# Patient Record
Sex: Male | Born: 1935
Health system: Southern US, Community
[De-identification: ages and names within clinical notes are randomized; demographics above are authoritative.]

## PROBLEM LIST (undated history)

## (undated) ENCOUNTER — Emergency Department: Admission: EM | Discharge status: Absent without leave | Payer: Medicare Other

## (undated) DIAGNOSIS — R0600 Dyspnea, unspecified: Secondary | ICD-10-CM

## (undated) DIAGNOSIS — Z720 Tobacco use: Secondary | ICD-10-CM

## (undated) DIAGNOSIS — N183 Chronic kidney disease, stage 3 unspecified: Secondary | ICD-10-CM

## (undated) DIAGNOSIS — F319 Bipolar disorder, unspecified: Secondary | ICD-10-CM

## (undated) DIAGNOSIS — I10 Essential (primary) hypertension: Secondary | ICD-10-CM

## (undated) DIAGNOSIS — J45909 Unspecified asthma, uncomplicated: Secondary | ICD-10-CM

## (undated) DIAGNOSIS — C61 Malignant neoplasm of prostate: Secondary | ICD-10-CM

## (undated) DIAGNOSIS — I2510 Atherosclerotic heart disease of native coronary artery without angina pectoris: Secondary | ICD-10-CM

## (undated) DIAGNOSIS — I5042 Chronic combined systolic (congestive) and diastolic (congestive) heart failure: Secondary | ICD-10-CM

## (undated) DIAGNOSIS — F1020 Alcohol dependence, uncomplicated: Secondary | ICD-10-CM

## (undated) DIAGNOSIS — I255 Ischemic cardiomyopathy: Secondary | ICD-10-CM

## (undated) DIAGNOSIS — M1990 Unspecified osteoarthritis, unspecified site: Secondary | ICD-10-CM

## (undated) DIAGNOSIS — I739 Peripheral vascular disease, unspecified: Secondary | ICD-10-CM

## (undated) DIAGNOSIS — J449 Chronic obstructive pulmonary disease, unspecified: Secondary | ICD-10-CM

## (undated) DIAGNOSIS — E785 Hyperlipidemia, unspecified: Secondary | ICD-10-CM

## (undated) DIAGNOSIS — G4733 Obstructive sleep apnea (adult) (pediatric): Secondary | ICD-10-CM

## (undated) HISTORY — DX: Hyperlipidemia, unspecified: E78.5

## (undated) HISTORY — DX: Peripheral vascular disease, unspecified: I73.9

## (undated) HISTORY — DX: Obstructive sleep apnea (adult) (pediatric): G47.33

## (undated) HISTORY — DX: Bipolar disorder, unspecified: F31.9

## (undated) HISTORY — DX: Tobacco use: Z72.0

## (undated) HISTORY — DX: Essential (primary) hypertension: I10

## (undated) HISTORY — PX: CARDIAC CATHETERIZATION: SHX172

## (undated) HISTORY — DX: Atherosclerotic heart disease of native coronary artery without angina pectoris: I25.10

## (undated) HISTORY — DX: Unspecified osteoarthritis, unspecified site: M19.90

## (undated) HISTORY — DX: Alcohol dependence, uncomplicated: F10.20

## (undated) HISTORY — PX: TOTAL HIP ARTHROPLASTY: SHX124

## (undated) HISTORY — PX: CORONARY ANGIOPLASTY WITH STENT PLACEMENT: SHX49

---

## 1999-02-17 ENCOUNTER — Ambulatory Visit (HOSPITAL_COMMUNITY): Admission: RE | Admit: 1999-02-17 | Discharge: 1999-02-17 | Payer: Self-pay | Admitting: Gastroenterology

## 1999-02-17 ENCOUNTER — Encounter: Payer: Self-pay | Admitting: Gastroenterology

## 1999-03-31 ENCOUNTER — Encounter: Payer: Self-pay | Admitting: Internal Medicine

## 1999-03-31 ENCOUNTER — Ambulatory Visit (HOSPITAL_COMMUNITY): Admission: RE | Admit: 1999-03-31 | Discharge: 1999-03-31 | Payer: Self-pay | Admitting: Internal Medicine

## 1999-05-30 ENCOUNTER — Encounter: Payer: Self-pay | Admitting: Internal Medicine

## 1999-05-30 ENCOUNTER — Ambulatory Visit (HOSPITAL_COMMUNITY): Admission: RE | Admit: 1999-05-30 | Discharge: 1999-05-30 | Payer: Self-pay | Admitting: Internal Medicine

## 1999-11-28 ENCOUNTER — Inpatient Hospital Stay (HOSPITAL_COMMUNITY): Admission: EM | Admit: 1999-11-28 | Discharge: 1999-12-03 | Payer: Self-pay | Admitting: Specialist

## 2000-05-10 ENCOUNTER — Other Ambulatory Visit: Admission: RE | Admit: 2000-05-10 | Discharge: 2000-05-10 | Payer: Self-pay | Admitting: Urology

## 2000-05-18 ENCOUNTER — Encounter: Payer: Self-pay | Admitting: Urology

## 2000-05-18 ENCOUNTER — Encounter: Admission: RE | Admit: 2000-05-18 | Discharge: 2000-05-18 | Payer: Self-pay | Admitting: Urology

## 2000-06-04 ENCOUNTER — Encounter: Admission: RE | Admit: 2000-06-04 | Discharge: 2000-09-02 | Payer: Self-pay | Admitting: Radiation Oncology

## 2000-09-26 ENCOUNTER — Encounter: Payer: Self-pay | Admitting: Internal Medicine

## 2000-09-26 ENCOUNTER — Ambulatory Visit (HOSPITAL_COMMUNITY): Admission: RE | Admit: 2000-09-26 | Discharge: 2000-09-26 | Payer: Self-pay | Admitting: Internal Medicine

## 2000-10-17 ENCOUNTER — Emergency Department (HOSPITAL_COMMUNITY): Admission: EM | Admit: 2000-10-17 | Discharge: 2000-10-17 | Payer: Self-pay | Admitting: Emergency Medicine

## 2000-10-17 ENCOUNTER — Encounter: Payer: Self-pay | Admitting: Emergency Medicine

## 2001-03-06 ENCOUNTER — Encounter: Admission: RE | Admit: 2001-03-06 | Discharge: 2001-03-06 | Payer: Self-pay | Admitting: Orthopaedic Surgery

## 2004-01-19 DIAGNOSIS — I2510 Atherosclerotic heart disease of native coronary artery without angina pectoris: Secondary | ICD-10-CM | POA: Insufficient documentation

## 2004-01-19 HISTORY — DX: Atherosclerotic heart disease of native coronary artery without angina pectoris: I25.10

## 2004-02-04 ENCOUNTER — Emergency Department (HOSPITAL_COMMUNITY): Admission: EM | Admit: 2004-02-04 | Discharge: 2004-02-04 | Payer: Self-pay | Admitting: *Deleted

## 2004-02-09 ENCOUNTER — Inpatient Hospital Stay (HOSPITAL_COMMUNITY): Admission: EM | Admit: 2004-02-09 | Discharge: 2004-02-11 | Payer: Self-pay

## 2004-02-10 ENCOUNTER — Encounter (INDEPENDENT_AMBULATORY_CARE_PROVIDER_SITE_OTHER): Payer: Self-pay | Admitting: Cardiovascular Disease

## 2005-11-07 ENCOUNTER — Inpatient Hospital Stay (HOSPITAL_COMMUNITY): Admission: EM | Admit: 2005-11-07 | Discharge: 2005-11-11 | Payer: Self-pay | Admitting: Emergency Medicine

## 2005-11-07 ENCOUNTER — Ambulatory Visit: Payer: Self-pay | Admitting: Family Medicine

## 2005-11-10 ENCOUNTER — Ambulatory Visit: Payer: Self-pay | Admitting: Emergency Medicine

## 2006-04-19 ENCOUNTER — Observation Stay: Payer: Self-pay | Admitting: Internal Medicine

## 2006-05-27 ENCOUNTER — Emergency Department (HOSPITAL_COMMUNITY): Admission: EM | Admit: 2006-05-27 | Discharge: 2006-05-27 | Payer: Self-pay | Admitting: Emergency Medicine

## 2006-11-22 ENCOUNTER — Ambulatory Visit: Payer: Self-pay | Admitting: Psychiatry

## 2006-11-22 ENCOUNTER — Emergency Department (HOSPITAL_COMMUNITY): Admission: EM | Admit: 2006-11-22 | Discharge: 2006-11-22 | Payer: Self-pay | Admitting: Emergency Medicine

## 2006-11-22 ENCOUNTER — Inpatient Hospital Stay (HOSPITAL_COMMUNITY): Admission: RE | Admit: 2006-11-22 | Discharge: 2006-12-03 | Payer: Self-pay | Admitting: Psychiatry

## 2007-07-13 ENCOUNTER — Emergency Department: Payer: Self-pay | Admitting: Emergency Medicine

## 2007-07-17 ENCOUNTER — Emergency Department: Payer: Self-pay | Admitting: Emergency Medicine

## 2007-07-25 ENCOUNTER — Emergency Department (HOSPITAL_COMMUNITY): Admission: EM | Admit: 2007-07-25 | Discharge: 2007-07-25 | Payer: Self-pay | Admitting: Emergency Medicine

## 2007-11-19 ENCOUNTER — Emergency Department (HOSPITAL_COMMUNITY): Admission: EM | Admit: 2007-11-19 | Discharge: 2007-11-20 | Payer: Self-pay | Admitting: Emergency Medicine

## 2007-12-22 ENCOUNTER — Emergency Department (HOSPITAL_COMMUNITY): Admission: EM | Admit: 2007-12-22 | Discharge: 2007-12-22 | Payer: Self-pay | Admitting: Emergency Medicine

## 2008-07-21 ENCOUNTER — Ambulatory Visit: Payer: Self-pay | Admitting: Psychiatry

## 2008-07-21 ENCOUNTER — Emergency Department (HOSPITAL_COMMUNITY): Admission: EM | Admit: 2008-07-21 | Discharge: 2008-07-22 | Payer: Self-pay | Admitting: Emergency Medicine

## 2008-07-22 ENCOUNTER — Inpatient Hospital Stay (HOSPITAL_COMMUNITY): Admission: RE | Admit: 2008-07-22 | Discharge: 2008-08-12 | Payer: Self-pay | Admitting: Psychiatry

## 2008-09-02 ENCOUNTER — Emergency Department: Payer: Self-pay | Admitting: Internal Medicine

## 2008-09-28 ENCOUNTER — Other Ambulatory Visit: Payer: Self-pay | Admitting: Emergency Medicine

## 2008-09-28 ENCOUNTER — Inpatient Hospital Stay (HOSPITAL_COMMUNITY): Admission: RE | Admit: 2008-09-28 | Discharge: 2008-10-13 | Payer: Self-pay | Admitting: *Deleted

## 2008-09-28 ENCOUNTER — Other Ambulatory Visit: Payer: Self-pay

## 2008-09-28 ENCOUNTER — Ambulatory Visit: Payer: Self-pay | Admitting: *Deleted

## 2008-10-18 ENCOUNTER — Ambulatory Visit: Payer: Self-pay | Admitting: Infectious Disease

## 2008-10-18 ENCOUNTER — Inpatient Hospital Stay (HOSPITAL_COMMUNITY): Admission: EM | Admit: 2008-10-18 | Discharge: 2008-10-24 | Payer: Self-pay | Admitting: Emergency Medicine

## 2008-12-25 ENCOUNTER — Encounter (INDEPENDENT_AMBULATORY_CARE_PROVIDER_SITE_OTHER): Payer: Self-pay | Admitting: Internal Medicine

## 2009-01-17 ENCOUNTER — Inpatient Hospital Stay (HOSPITAL_COMMUNITY): Admission: AD | Admit: 2009-01-17 | Discharge: 2009-01-29 | Payer: Self-pay | Admitting: Psychiatry

## 2009-01-17 ENCOUNTER — Ambulatory Visit: Payer: Self-pay | Admitting: *Deleted

## 2009-02-16 ENCOUNTER — Inpatient Hospital Stay (HOSPITAL_COMMUNITY): Admission: AD | Admit: 2009-02-16 | Discharge: 2009-03-10 | Payer: Self-pay | Admitting: *Deleted

## 2009-02-16 ENCOUNTER — Ambulatory Visit: Payer: Self-pay | Admitting: *Deleted

## 2009-06-12 ENCOUNTER — Inpatient Hospital Stay (HOSPITAL_COMMUNITY): Admission: EM | Admit: 2009-06-12 | Discharge: 2009-06-16 | Payer: Self-pay | Admitting: Emergency Medicine

## 2009-06-12 ENCOUNTER — Ambulatory Visit: Payer: Self-pay | Admitting: Surgery

## 2009-06-12 ENCOUNTER — Emergency Department (HOSPITAL_COMMUNITY): Admission: EM | Admit: 2009-06-12 | Discharge: 2009-06-12 | Payer: Self-pay | Admitting: Emergency Medicine

## 2009-06-13 ENCOUNTER — Encounter: Payer: Self-pay | Admitting: Internal Medicine

## 2009-06-14 ENCOUNTER — Encounter (INDEPENDENT_AMBULATORY_CARE_PROVIDER_SITE_OTHER): Payer: Self-pay | Admitting: *Deleted

## 2009-06-16 ENCOUNTER — Ambulatory Visit: Payer: Self-pay | Admitting: Psychiatry

## 2009-06-16 ENCOUNTER — Inpatient Hospital Stay (HOSPITAL_COMMUNITY): Admission: RE | Admit: 2009-06-16 | Discharge: 2009-06-28 | Payer: Self-pay | Admitting: Psychiatry

## 2009-06-19 ENCOUNTER — Encounter: Payer: Self-pay | Admitting: Emergency Medicine

## 2009-06-29 ENCOUNTER — Emergency Department (HOSPITAL_COMMUNITY): Admission: EM | Admit: 2009-06-29 | Discharge: 2009-06-29 | Payer: Self-pay | Admitting: Emergency Medicine

## 2009-08-14 ENCOUNTER — Emergency Department (HOSPITAL_COMMUNITY): Admission: EM | Admit: 2009-08-14 | Discharge: 2009-08-14 | Payer: Self-pay | Admitting: Emergency Medicine

## 2009-12-10 ENCOUNTER — Other Ambulatory Visit: Payer: Self-pay | Admitting: Emergency Medicine

## 2009-12-10 ENCOUNTER — Inpatient Hospital Stay (HOSPITAL_COMMUNITY): Admission: RE | Admit: 2009-12-10 | Discharge: 2009-12-21 | Payer: Self-pay | Admitting: Psychiatry

## 2009-12-10 ENCOUNTER — Ambulatory Visit: Payer: Self-pay | Admitting: Psychiatry

## 2010-09-09 ENCOUNTER — Ambulatory Visit (HOSPITAL_COMMUNITY): Payer: Self-pay | Admitting: Psychiatry

## 2010-10-29 ENCOUNTER — Inpatient Hospital Stay (HOSPITAL_COMMUNITY): Admission: EM | Admit: 2010-10-29 | Discharge: 2010-11-01 | Payer: Self-pay | Admitting: Emergency Medicine

## 2010-10-31 ENCOUNTER — Ambulatory Visit: Payer: Self-pay | Admitting: Internal Medicine

## 2010-11-02 ENCOUNTER — Encounter: Payer: Self-pay | Admitting: Internal Medicine

## 2010-12-29 ENCOUNTER — Emergency Department (HOSPITAL_COMMUNITY)
Admission: EM | Admit: 2010-12-29 | Discharge: 2010-12-29 | Payer: Self-pay | Source: Home / Self Care | Admitting: Emergency Medicine

## 2011-01-02 LAB — COMPREHENSIVE METABOLIC PANEL
ALT: 41 U/L (ref 0–53)
AST: 51 U/L — ABNORMAL HIGH (ref 0–37)
Albumin: 3.8 g/dL (ref 3.5–5.2)
Alkaline Phosphatase: 124 U/L — ABNORMAL HIGH (ref 39–117)
BUN: 4 mg/dL — ABNORMAL LOW (ref 6–23)
CO2: 22 mEq/L (ref 19–32)
Calcium: 8.9 mg/dL (ref 8.4–10.5)
Chloride: 105 mEq/L (ref 96–112)
Creatinine, Ser: 0.69 mg/dL (ref 0.4–1.5)
GFR calc Af Amer: >60 mL/min (ref >60)
GFR calc non Af Amer: >60 mL/min (ref >60)
Glucose, Bld: 88 mg/dL (ref 70–99)
Potassium: 3.7 mEq/L (ref 3.5–5.1)
Sodium: 139 mEq/L (ref 135–145)
Total Bilirubin: 0.7 mg/dL (ref 0.3–1.2)
Total Protein: 7 g/dL (ref 6.0–8.3)

## 2011-01-02 LAB — CBC
HCT: 39.1 % (ref 39.0–52.0)
Hemoglobin: 13.7 g/dL (ref 13.0–17.0)
MCH: 30.1 pg (ref 26.0–34.0)
MCHC: 35 g/dL (ref 30.0–36.0)
MCV: 85.9 fL (ref 78.0–100.0)
Platelets: 245 10*3/uL (ref 150–400)
RBC: 4.55 MIL/uL (ref 4.22–5.81)
RDW: 16.3 % — ABNORMAL HIGH (ref 11.5–15.5)
WBC: 4.6 10*3/uL (ref 4.0–10.5)

## 2011-01-02 LAB — URINALYSIS, ROUTINE W REFLEX MICROSCOPIC
Ketones, ur: 15 mg/dL — AB
Nitrite: NEGATIVE
Protein, ur: 100 mg/dL — AB
Specific Gravity, Urine: 1.027 (ref 1.005–1.030)
Urine Glucose, Fasting: NEGATIVE mg/dL
Urobilinogen, UA: 1 mg/dL (ref 0.0–1.0)
pH: 6.5 (ref 5.0–8.0)

## 2011-01-02 LAB — DIFFERENTIAL
Basophils Absolute: 0.1 10*3/uL (ref 0.0–0.1)
Basophils Relative: 1 % (ref 0–1)
Eosinophils Absolute: 0 10*3/uL (ref 0.0–0.7)
Eosinophils Relative: 1 % (ref 0–5)
Lymphocytes Relative: 34 % (ref 12–46)
Lymphs Abs: 1.6 10*3/uL (ref 0.7–4.0)
Monocytes Absolute: 0.4 10*3/uL (ref 0.1–1.0)
Monocytes Relative: 9 % (ref 3–12)
Neutro Abs: 2.6 10*3/uL (ref 1.7–7.7)
Neutrophils Relative %: 56 % (ref 43–77)

## 2011-01-02 LAB — URINE MICROSCOPIC-ADD ON

## 2011-01-02 LAB — LIPASE, BLOOD: Lipase: 22 U/L (ref 11–59)

## 2011-01-04 ENCOUNTER — Emergency Department (HOSPITAL_COMMUNITY)
Admission: EM | Admit: 2011-01-04 | Discharge: 2011-01-04 | Payer: Self-pay | Source: Home / Self Care | Admitting: Emergency Medicine

## 2011-01-08 ENCOUNTER — Encounter: Payer: Self-pay | Admitting: Emergency Medicine

## 2011-01-09 LAB — POCT I-STAT, CHEM 8
BUN: 5 mg/dL — ABNORMAL LOW (ref 6–23)
Calcium, Ion: 1.08 mmol/L — ABNORMAL LOW (ref 1.12–1.32)
Chloride: 102 mEq/L (ref 96–112)
Glucose, Bld: 108 mg/dL — ABNORMAL HIGH (ref 70–99)

## 2011-01-09 LAB — URINE MICROSCOPIC-ADD ON

## 2011-01-09 LAB — URINALYSIS, ROUTINE W REFLEX MICROSCOPIC
Ketones, ur: NEGATIVE mg/dL
Urine Glucose, Fasting: NEGATIVE mg/dL
pH: 7 (ref 5.0–8.0)

## 2011-01-17 NOTE — Letter (Signed)
Summary: Patient [name] Biopsy Results  [name]  [place], [place] [address]   Phone: [phone]  Fax: [phone]        November 02, 2010 MRN: [id number]    [facility] [address] [place], [place]  [address]    Dear [name],  I am pleased to inform you that the biopsies taken during your recent endoscopic examination did not show any evidence of cancer upon pathologic examination.The biopsies show inflammation and atypical cells which ought to be rechecked in 5 years.  Additional information/recommendations:  __No further action is needed at this time.  Please follow-up with      your primary care physician for your other healthcare needs.  __ Please call [phone] to schedule a return visit to review      your condition.  x__ Continue with the treatment plan as outlined on the day of your      exam.  _x_ You should have a repeat endoscopic examination for this problem              in _5 /years.   Please call us if you are having persistent problems or have questions about your condition that have not been fully answered at this time.  Sincerely,  Nicholas Dragon MD  This letter has been electronically signed by your physician.  Appended Document: Patient Notice-Endo Biopsy Results letter mailed to patient's home

## 2011-01-17 NOTE — Procedures (Signed)
Summary: Upper Endoscopy  Patient: [name] Note: All result statuses are Final unless otherwise noted.  Tests: (1) Upper Endoscopy (EGD)   EGD Upper Endoscopy       [facility]     [place], [place]  [address]           ENDOSCOPY PROCEDURE REPORT           PATIENT:  [name], [name]  MR#:  [id number]     BIRTHDATE:  1935-03-21, 75 yrs. old  GENDER:  male           ENDOSCOPIST:  Lowella Bandy. Olevia Perches, MD     Referred by:  Edythe Lynn, M.D.           PROCEDURE DATE:  10/31/2010     PROCEDURE:  EGD with biopsy, 432[address]     ASA CLASS:  Class II     INDICATIONS:  abdominal pain hx of alcoholism, stopped x 8 months     and started last 2 weeks,, CT scan negative           MEDICATIONS:   Versed 6 mg, Fentanyl 60 mcg     TOPICAL ANESTHETIC:  Cetacaine Spray           DESCRIPTION OF PROCEDURE:   After the risks benefits and     alternatives of the procedure were thoroughly explained, informed     consent was obtained.  The [id number] endoscope was     introduced through the mouth and advanced to the second portion of     the duodenum, without limitations.  The instrument was slowly     withdrawn as the mucosa was fully examined.     <<PROCEDUREIMAGES>>           Mild gastritis was found. mild antral gastritis ( erosions), no     bleeding With standard forceps, a biopsy was obtained and sent to     pathology (see image003).  Esophagitis was found in the distal     esophagus. mild erythema amd a streak of irregular mucosa about 2     cm long above z-line, r/o Barrett's With standard forceps, a     biopsy was obtained and sent to pathology (see image002 and     image001).  Otherwise the examination was normal (see image006 and     image005). small reducible hiatal hernia    Retroflexed views     revealed no abnormalities.    The scope was then withdrawn from     the patient and the procedure completed.           COMPLICATIONS:  None       ENDOSCOPIC IMPRESSION:     1) Mild gastritis     2) Esophagitis in the distal esophagus     3) Otherwise normal examination     nothing to account for abdominal pain, r/o low grade     pancreatitis     RECOMMENDATIONS:     1) Await biopsy results     PPI, bowl rest,recheck amylase/lipase, antialcohol counselling           REPEAT EXAM:  In 0 year(s) for.           ______________________________     Lowella Bandy. Olevia Perches, MD           CC:           n.  eSIGNED:   Lowella Bandy. Lennix Kneisel at 10/31/2010 11:14 AM           Barbaraann Rondo, [id number]  Note: An exclamation mark (!) indicates a result that was not dispersed into the flowsheet. Document Creation Date: 10/31/2010 11:14 AM _______________________________________________________________________  (1) Order result status: Final Collection or observation date-time: 10/31/2010 11:00 Requested date-time:  Receipt date-time:  Reported date-time:  Referring Physician:   Ordering Physician: Delfin Edis [phone]) Specimen Source:  Source: Tawanna Cooler Order Number: [id number] Lab site:

## 2011-02-28 LAB — COMPREHENSIVE METABOLIC PANEL
ALT: 100 U/L — ABNORMAL HIGH (ref 0–53)
ALT: 26 U/L (ref 0–53)
AST: 50 U/L — ABNORMAL HIGH (ref 0–37)
AST: 68 U/L — ABNORMAL HIGH (ref 0–37)
Albumin: 2.9 g/dL — ABNORMAL LOW (ref 3.5–5.2)
Albumin: 3.1 g/dL — ABNORMAL LOW (ref 3.5–5.2)
Alkaline Phosphatase: 111 U/L (ref 39–117)
Alkaline Phosphatase: 134 U/L — ABNORMAL HIGH (ref 39–117)
BUN: 3 mg/dL — ABNORMAL LOW (ref 6–23)
BUN: 8 mg/dL (ref 6–23)
CO2: 24 mEq/L (ref 19–32)
Calcium: 8.4 mg/dL (ref 8.4–10.5)
Chloride: 98 mEq/L (ref 96–112)
Creatinine, Ser: 0.8 mg/dL (ref 0.4–1.5)
Creatinine, Ser: 0.88 mg/dL (ref 0.4–1.5)
GFR calc Af Amer: >60 mL/min (ref >60)
GFR calc Af Amer: >60 mL/min (ref >60)
GFR calc non Af Amer: >60 mL/min (ref >60)
Glucose, Bld: 91 mg/dL (ref 70–99)
Glucose, Bld: 98 mg/dL (ref 70–99)
Potassium: 3.6 mEq/L (ref 3.5–5.1)
Potassium: 3.7 mEq/L (ref 3.5–5.1)
Sodium: 138 mEq/L (ref 135–145)
Sodium: 140 mEq/L (ref 135–145)
Total Bilirubin: 1.1 mg/dL (ref 0.3–1.2)
Total Protein: 5.7 g/dL — ABNORMAL LOW (ref 6.0–8.3)
Total Protein: 6.2 g/dL (ref 6.0–8.3)
Total Protein: 7.2 g/dL (ref 6.0–8.3)

## 2011-02-28 LAB — CBC
HCT: 36.6 % — ABNORMAL LOW (ref 39.0–52.0)
HCT: 39.4 % (ref 39.0–52.0)
Hemoglobin: 13.7 g/dL (ref 13.0–17.0)
MCH: 27.9 pg (ref 26.0–34.0)
MCH: 28.7 pg (ref 26.0–34.0)
MCHC: 33.1 g/dL (ref 30.0–36.0)
MCHC: 34.8 g/dL (ref 30.0–36.0)
MCV: 84 fL (ref 78.0–100.0)
Platelets: 157 10*3/uL (ref 150–400)
Platelets: 191 10*3/uL (ref 150–400)
RDW: 14.5 % (ref 11.5–15.5)
RDW: 14.7 % (ref 11.5–15.5)

## 2011-02-28 LAB — LIPID PANEL
LDL Cholesterol: 78 mg/dL (ref 0–99)
VLDL: 12 mg/dL (ref 0–40)

## 2011-02-28 LAB — DIFFERENTIAL
Basophils Absolute: 0 10*3/uL (ref 0.0–0.1)
Basophils Relative: 0 % (ref 0–1)
Basophils Relative: 0 % (ref 0–1)
Eosinophils Absolute: 0 10*3/uL (ref 0.0–0.7)
Eosinophils Absolute: 0.1 10*3/uL (ref 0.0–0.7)
Eosinophils Relative: 0 % (ref 0–5)
Monocytes Relative: 5 % (ref 3–12)
Monocytes Relative: 8 % (ref 3–12)
Neutrophils Relative %: 61 % (ref 43–77)
Neutrophils Relative %: 80 % — ABNORMAL HIGH (ref 43–77)

## 2011-02-28 LAB — HEPATITIS C ANTIBODY: HCV Ab: NEGATIVE

## 2011-02-28 LAB — LACTIC ACID, PLASMA
Lactic Acid, Venous: 1.3 mmol/L (ref 0.5–2.2)
Lactic Acid, Venous: 2.6 mmol/L — ABNORMAL HIGH (ref 0.5–2.2)

## 2011-02-28 LAB — TSH: TSH: 0.798 u[IU]/mL (ref 0.350–4.500)

## 2011-02-28 LAB — URINALYSIS, ROUTINE W REFLEX MICROSCOPIC
Bilirubin Urine: NEGATIVE
Glucose, UA: NEGATIVE mg/dL
Ketones, ur: >80 mg/dL — AB
Protein, ur: 100 mg/dL — AB

## 2011-02-28 LAB — BASIC METABOLIC PANEL
Calcium: 8.5 mg/dL (ref 8.4–10.5)
GFR calc Af Amer: >60 mL/min (ref >60)
GFR calc non Af Amer: >60 mL/min (ref >60)
Glucose, Bld: 74 mg/dL (ref 70–99)
Sodium: 138 mEq/L (ref 135–145)

## 2011-02-28 LAB — RAPID URINE DRUG SCREEN, HOSP PERFORMED
Barbiturates: NOT DETECTED
Cocaine: NOT DETECTED
Opiates: POSITIVE — AB

## 2011-02-28 LAB — AMYLASE: Amylase: 79 U/L (ref 0–105)

## 2011-02-28 LAB — MAGNESIUM: Magnesium: 1.7 mg/dL (ref 1.5–2.5)

## 2011-02-28 LAB — GLUCOSE, CAPILLARY: Glucose-Capillary: 99 mg/dL (ref 70–99)

## 2011-02-28 LAB — TROPONIN I
Troponin I: 0.01 ng/mL (ref 0.00–0.06)
Troponin I: 0.02 ng/mL (ref 0.00–0.06)

## 2011-02-28 LAB — HEPATITIS B SURFACE ANTIBODY,QUALITATIVE: Hep B S Ab: POSITIVE — AB

## 2011-02-28 LAB — URINE MICROSCOPIC-ADD ON

## 2011-02-28 LAB — APTT: aPTT: 26 seconds (ref 24–37)

## 2011-02-28 LAB — PROTIME-INR: INR: 0.98 (ref 0.00–1.49)

## 2011-02-28 LAB — LIPASE, BLOOD: Lipase: 22 U/L (ref 11–59)

## 2011-03-10 ENCOUNTER — Encounter (INDEPENDENT_AMBULATORY_CARE_PROVIDER_SITE_OTHER): Payer: Self-pay | Admitting: Internal Medicine

## 2011-03-16 NOTE — Letter (Signed)
Summary: [facility]//SUPPLIES & EQUIPMENT//FAXED  [facility]//SUPPLIES & EQUIPMENT//FAXED   Imported By: Roland Earl 03/10/2011 11:55:32  _____________________________________________________________________  External Attachment:    Type:   Image     Comment:   External Document

## 2011-03-20 LAB — URINE MICROSCOPIC-ADD ON

## 2011-03-20 LAB — URINALYSIS, ROUTINE W REFLEX MICROSCOPIC
Bilirubin Urine: NEGATIVE
Glucose, UA: NEGATIVE mg/dL
Hgb urine dipstick: NEGATIVE
Ketones, ur: NEGATIVE mg/dL
pH: 5 (ref 5.0–8.0)

## 2011-03-20 LAB — COMPREHENSIVE METABOLIC PANEL
ALT: 11 U/L (ref 0–53)
AST: 19 U/L (ref 0–37)
Alkaline Phosphatase: 131 U/L — ABNORMAL HIGH (ref 39–117)
CO2: 22 mEq/L (ref 19–32)
GFR calc Af Amer: >60 mL/min (ref >60)
GFR calc non Af Amer: >60 mL/min (ref >60)
Glucose, Bld: 123 mg/dL — ABNORMAL HIGH (ref 70–99)
Potassium: 4 mEq/L (ref 3.5–5.1)
Sodium: 140 mEq/L (ref 135–145)

## 2011-03-20 LAB — DIFFERENTIAL
Basophils Relative: 1 % (ref 0–1)
Eosinophils Absolute: 0.1 10*3/uL (ref 0.0–0.7)
Eosinophils Relative: 1 % (ref 0–5)
Lymphs Abs: 2.6 10*3/uL (ref 0.7–4.0)
Monocytes Relative: 7 % (ref 3–12)
Neutrophils Relative %: 46 % (ref 43–77)

## 2011-03-20 LAB — RAPID URINE DRUG SCREEN, HOSP PERFORMED
Barbiturates: NOT DETECTED
Cocaine: NOT DETECTED
Opiates: NOT DETECTED
Tetrahydrocannabinol: POSITIVE — AB

## 2011-03-20 LAB — CBC
Hemoglobin: 14.7 g/dL (ref 13.0–17.0)
RBC: 4.98 MIL/uL (ref 4.22–5.81)
WBC: 5.9 10*3/uL (ref 4.0–10.5)

## 2011-03-20 LAB — ETHANOL: Alcohol, Ethyl (B): 252 mg/dL — ABNORMAL HIGH (ref 0–10)

## 2011-03-20 LAB — GC/CHLAMYDIA PROBE AMP, URINE: Chlamydia, Swab/Urine, PCR: NEGATIVE

## 2011-03-25 LAB — RAPID URINE DRUG SCREEN, HOSP PERFORMED
Amphetamines: NOT DETECTED
Barbiturates: NOT DETECTED
Cocaine: NOT DETECTED
Opiates: NOT DETECTED

## 2011-03-25 LAB — COMPREHENSIVE METABOLIC PANEL
Albumin: 3.5 g/dL (ref 3.5–5.2)
Alkaline Phosphatase: 129 U/L — ABNORMAL HIGH (ref 39–117)
BUN: 3 mg/dL — ABNORMAL LOW (ref 6–23)
CO2: 25 mEq/L (ref 19–32)
Chloride: 103 mEq/L (ref 96–112)
Creatinine, Ser: 0.88 mg/dL (ref 0.4–1.5)
GFR calc non Af Amer: >60 mL/min (ref >60)
Glucose, Bld: 103 mg/dL — ABNORMAL HIGH (ref 70–99)
Potassium: 3.7 mEq/L (ref 3.5–5.1)
Total Bilirubin: 0.5 mg/dL (ref 0.3–1.2)

## 2011-03-25 LAB — POCT CARDIAC MARKERS
CKMB, poc: 1.3 ng/mL (ref 1.0–8.0)
Myoglobin, poc: 85.8 ng/mL (ref 12–200)
Troponin i, poc: <0.05 ng/mL (ref 0.00–0.09)

## 2011-03-25 LAB — CBC
HCT: 41.6 % (ref 39.0–52.0)
Hemoglobin: 14 g/dL (ref 13.0–17.0)
MCV: 87.8 fL (ref 78.0–100.0)
Platelets: 339 10*3/uL (ref 150–400)
WBC: 6.3 10*3/uL (ref 4.0–10.5)

## 2011-03-25 LAB — URINALYSIS, ROUTINE W REFLEX MICROSCOPIC
Bilirubin Urine: NEGATIVE
Hgb urine dipstick: NEGATIVE
Ketones, ur: NEGATIVE mg/dL
Nitrite: NEGATIVE
Specific Gravity, Urine: 1.022 (ref 1.005–1.030)
Urobilinogen, UA: 0.2 mg/dL (ref 0.0–1.0)

## 2011-03-25 LAB — DIFFERENTIAL
Basophils Absolute: 0 10*3/uL (ref 0.0–0.1)
Basophils Relative: 0 % (ref 0–1)
Lymphocytes Relative: 34 % (ref 12–46)
Monocytes Absolute: 0.4 10*3/uL (ref 0.1–1.0)
Neutro Abs: 3.7 10*3/uL (ref 1.7–7.7)
Neutrophils Relative %: 58 % (ref 43–77)

## 2011-03-25 LAB — ETHANOL: Alcohol, Ethyl (B): 102 mg/dL — ABNORMAL HIGH (ref 0–10)

## 2011-03-25 LAB — URINE MICROSCOPIC-ADD ON

## 2011-03-26 LAB — POCT CARDIAC MARKERS
CKMB, poc: 1.2 ng/mL (ref 1.0–8.0)
CKMB, poc: 1.5 ng/mL (ref 1.0–8.0)
Myoglobin, poc: 69.4 ng/mL (ref 12–200)
Troponin i, poc: <0.05 ng/mL (ref 0.00–0.09)
Troponin i, poc: <0.05 ng/mL (ref 0.00–0.09)

## 2011-03-26 LAB — BASIC METABOLIC PANEL
BUN: 12 mg/dL (ref 6–23)
CO2: 25 mEq/L (ref 19–32)
Calcium: 9 mg/dL (ref 8.4–10.5)
Glucose, Bld: 144 mg/dL — ABNORMAL HIGH (ref 70–99)
Potassium: 4.2 mEq/L (ref 3.5–5.1)
Sodium: 134 mEq/L — ABNORMAL LOW (ref 135–145)

## 2011-03-26 LAB — CBC
HCT: 39.4 % (ref 39.0–52.0)
Hemoglobin: 13.7 g/dL (ref 13.0–17.0)
MCHC: 34.7 g/dL (ref 30.0–36.0)
Platelets: 230 10*3/uL (ref 150–400)
RDW: 14.3 % (ref 11.5–15.5)

## 2011-03-26 LAB — DIFFERENTIAL
Basophils Absolute: 0 10*3/uL (ref 0.0–0.1)
Basophils Relative: 1 % (ref 0–1)
Eosinophils Absolute: 0.3 10*3/uL (ref 0.0–0.7)
Eosinophils Relative: 4 % (ref 0–5)
Monocytes Absolute: 0.7 10*3/uL (ref 0.1–1.0)

## 2011-03-26 LAB — POCT I-STAT, CHEM 8
Calcium, Ion: 1.03 mmol/L — ABNORMAL LOW (ref 1.12–1.32)
Chloride: 104 mEq/L (ref 96–112)
Glucose, Bld: 110 mg/dL — ABNORMAL HIGH (ref 70–99)
HCT: 42 % (ref 39.0–52.0)
Hemoglobin: 14.3 g/dL (ref 13.0–17.0)
TCO2: 24 mmol/L (ref 0–100)

## 2011-03-27 ENCOUNTER — Encounter (HOSPITAL_COMMUNITY): Payer: Self-pay | Admitting: Radiology

## 2011-03-27 ENCOUNTER — Emergency Department (HOSPITAL_COMMUNITY): Payer: Medicare Other

## 2011-03-27 ENCOUNTER — Emergency Department (HOSPITAL_COMMUNITY)
Admission: EM | Admit: 2011-03-27 | Discharge: 2011-03-27 | Disposition: A | Discharge status: Home / Self Care | Payer: Medicare Other | Attending: Emergency Medicine | Admitting: Emergency Medicine

## 2011-03-27 DIAGNOSIS — F17200 Nicotine dependence, unspecified, uncomplicated: Secondary | ICD-10-CM | POA: Insufficient documentation

## 2011-03-27 DIAGNOSIS — R109 Unspecified abdominal pain: Secondary | ICD-10-CM | POA: Insufficient documentation

## 2011-03-27 DIAGNOSIS — F1010 Alcohol abuse, uncomplicated: Secondary | ICD-10-CM | POA: Insufficient documentation

## 2011-03-27 DIAGNOSIS — Q619 Cystic kidney disease, unspecified: Secondary | ICD-10-CM | POA: Insufficient documentation

## 2011-03-27 DIAGNOSIS — R112 Nausea with vomiting, unspecified: Secondary | ICD-10-CM | POA: Insufficient documentation

## 2011-03-27 DIAGNOSIS — I10 Essential (primary) hypertension: Secondary | ICD-10-CM | POA: Insufficient documentation

## 2011-03-27 DIAGNOSIS — I2510 Atherosclerotic heart disease of native coronary artery without angina pectoris: Secondary | ICD-10-CM | POA: Insufficient documentation

## 2011-03-27 HISTORY — DX: Malignant neoplasm of prostate: C61

## 2011-03-27 HISTORY — DX: Essential (primary) hypertension: I10

## 2011-03-27 HISTORY — DX: Chronic obstructive pulmonary disease, unspecified: J44.9

## 2011-03-27 LAB — COMPREHENSIVE METABOLIC PANEL
ALT: 14 U/L (ref 0–53)
Albumin: 3.2 g/dL — ABNORMAL LOW (ref 3.5–5.2)
Alkaline Phosphatase: 108 U/L (ref 39–117)
BUN: 9 mg/dL (ref 6–23)
CO2: 23 mEq/L (ref 19–32)
Calcium: 8.7 mg/dL (ref 8.4–10.5)
Calcium: 8.4 mg/dL (ref 8.4–10.5)
Chloride: 102 mEq/L (ref 96–112)
Creatinine, Ser: 1.03 mg/dL (ref 0.4–1.5)
GFR calc non Af Amer: >60 mL/min (ref >60)
Glucose, Bld: 83 mg/dL (ref 70–99)
Potassium: 4.1 mEq/L (ref 3.5–5.1)
Sodium: 140 mEq/L (ref 135–145)
Total Bilirubin: 0.6 mg/dL (ref 0.3–1.2)
Total Bilirubin: 0.7 mg/dL (ref 0.3–1.2)

## 2011-03-27 LAB — DIFFERENTIAL
Basophils Absolute: 0 10*3/uL (ref 0.0–0.1)
Basophils Absolute: 0 10*3/uL (ref 0.0–0.1)
Basophils Relative: 1 % (ref 0–1)
Eosinophils Relative: 1 % (ref 0–5)
Lymphocytes Relative: 28 % (ref 12–46)
Lymphocytes Relative: 20 % (ref 12–46)
Lymphocytes Relative: 24 % (ref 12–46)
Lymphs Abs: 2.2 10*3/uL (ref 0.7–4.0)
Lymphs Abs: 2 10*3/uL (ref 0.7–4.0)
Monocytes Absolute: 0.7 10*3/uL (ref 0.1–1.0)
Monocytes Relative: 5 % (ref 3–12)
Neutro Abs: 4.7 10*3/uL (ref 1.7–7.7)
Neutro Abs: 5.4 10*3/uL (ref 1.7–7.7)
Neutrophils Relative %: 75 % (ref 43–77)

## 2011-03-27 LAB — POCT I-STAT, CHEM 8
BUN: 7 mg/dL (ref 6–23)
Chloride: 106 mEq/L (ref 96–112)
HCT: 49 % (ref 39.0–52.0)
Potassium: 4 mEq/L (ref 3.5–5.1)

## 2011-03-27 LAB — CBC
HCT: 42 % (ref 39.0–52.0)
HCT: 38.3 % — ABNORMAL LOW (ref 39.0–52.0)
Hemoglobin: 15.9 g/dL (ref 13.0–17.0)
MCHC: 33.5 g/dL (ref 30.0–36.0)
MCHC: 34.2 g/dL (ref 30.0–36.0)
MCV: 87.7 fL (ref 78.0–100.0)
MCV: 88.4 fL (ref 78.0–100.0)
Platelets: 270 10*3/uL (ref 150–400)
RBC: 5.36 MIL/uL (ref 4.22–5.81)
RDW: 14.1 % (ref 11.5–15.5)
RDW: 14.9 % (ref 11.5–15.5)
RDW: 15.2 % (ref 11.5–15.5)
WBC: 7.7 10*3/uL (ref 4.0–10.5)
WBC: 8.2 10*3/uL (ref 4.0–10.5)

## 2011-03-27 LAB — URINE MICROSCOPIC-ADD ON

## 2011-03-27 LAB — URINALYSIS, ROUTINE W REFLEX MICROSCOPIC
Bilirubin Urine: NEGATIVE
Glucose, UA: NEGATIVE mg/dL
Leukocytes, UA: NEGATIVE
Leukocytes, UA: NEGATIVE
Nitrite: NEGATIVE
Specific Gravity, Urine: 1.012 (ref 1.005–1.030)
Urobilinogen, UA: 0.2 mg/dL (ref 0.0–1.0)
pH: 5.5 (ref 5.0–8.0)
pH: 6 (ref 5.0–8.0)

## 2011-03-27 LAB — LIPID PANEL
HDL: 54 mg/dL (ref >39)
Total CHOL/HDL Ratio: 2.2 RATIO
Triglycerides: 79 mg/dL (ref <150)
VLDL: 16 mg/dL (ref 0–40)

## 2011-03-27 LAB — POCT CARDIAC MARKERS
CKMB, poc: 2.3 ng/mL (ref 1.0–8.0)
Troponin i, poc: <0.05 ng/mL (ref 0.00–0.09)

## 2011-03-27 LAB — ETHANOL: Alcohol, Ethyl (B): 115 mg/dL — ABNORMAL HIGH (ref 0–10)

## 2011-03-27 LAB — FREE PSA

## 2011-03-27 LAB — HEMOGLOBIN A1C: Hgb A1c MFr Bld: 5.8 % (ref 4.6–6.1)

## 2011-03-27 LAB — CARDIAC PANEL(CRET KIN+CKTOT+MB+TROPI)
Total CK: 284 U/L — ABNORMAL HIGH (ref 7–232)
Troponin I: 0.01 ng/mL (ref 0.00–0.06)
Troponin I: 0.01 ng/mL (ref 0.00–0.06)
Troponin I: 0.01 ng/mL (ref 0.00–0.06)

## 2011-03-27 LAB — RAPID URINE DRUG SCREEN, HOSP PERFORMED
Amphetamines: NOT DETECTED
Opiates: NOT DETECTED
Tetrahydrocannabinol: POSITIVE — AB

## 2011-03-27 LAB — CK TOTAL AND CKMB (NOT AT ARMC)
CK, MB: 1.7 ng/mL (ref 0.3–4.0)
Relative Index: 1.1 (ref 0.0–2.5)

## 2011-03-27 LAB — TROPONIN I: Troponin I: 0.02 ng/mL (ref 0.00–0.06)

## 2011-03-27 LAB — PSA: PSA: 0.03 ng/mL — ABNORMAL LOW (ref 0.10–4.00)

## 2011-03-27 LAB — LIPASE, BLOOD: Lipase: 39 U/L (ref 11–59)

## 2011-03-27 MED ORDER — IOHEXOL 300 MG/ML  SOLN
125.0000 mL | Freq: Once | INTRAMUSCULAR | Status: AC | PRN
  Administered 2011-03-27: 125 mL via INTRAVENOUS

## 2011-03-30 LAB — COMPREHENSIVE METABOLIC PANEL
ALT: 20 U/L (ref 0–53)
AST: 29 U/L (ref 0–37)
Calcium: 9.1 mg/dL (ref 8.4–10.5)
Creatinine, Ser: 0.82 mg/dL (ref 0.4–1.5)
GFR calc Af Amer: >60 mL/min (ref >60)
Sodium: 137 mEq/L (ref 135–145)
Total Protein: 6.5 g/dL (ref 6.0–8.3)

## 2011-04-03 LAB — CBC
HCT: 36.2 % — ABNORMAL LOW (ref 39.0–52.0)
Hemoglobin: 12 g/dL — ABNORMAL LOW (ref 13.0–17.0)
MCV: 89 fL (ref 78.0–100.0)
Platelets: 245 10*3/uL (ref 150–400)
RDW: 15.2 % (ref 11.5–15.5)
WBC: 6.4 10*3/uL (ref 4.0–10.5)

## 2011-04-03 LAB — COMPREHENSIVE METABOLIC PANEL
Albumin: 2.9 g/dL — ABNORMAL LOW (ref 3.5–5.2)
Alkaline Phosphatase: 106 U/L (ref 39–117)
BUN: 8 mg/dL (ref 6–23)
Chloride: 101 mEq/L (ref 96–112)
Creatinine, Ser: 0.81 mg/dL (ref 0.4–1.5)
Glucose, Bld: 109 mg/dL — ABNORMAL HIGH (ref 70–99)
Potassium: 3.1 mEq/L — ABNORMAL LOW (ref 3.5–5.1)
Total Bilirubin: 0.6 mg/dL (ref 0.3–1.2)

## 2011-04-04 LAB — POTASSIUM: Potassium: 4.3 mEq/L (ref 3.5–5.1)

## 2011-04-18 ENCOUNTER — Ambulatory Visit: Payer: Medicare Other | Admitting: Physical Therapy

## 2011-04-21 ENCOUNTER — Emergency Department (HOSPITAL_COMMUNITY): Payer: Medicare Other

## 2011-04-21 ENCOUNTER — Emergency Department (HOSPITAL_COMMUNITY)
Admission: EM | Admit: 2011-04-21 | Discharge: 2011-04-21 | Disposition: A | Discharge status: Home / Self Care | Payer: Medicare Other | Attending: Emergency Medicine | Admitting: Emergency Medicine

## 2011-04-21 DIAGNOSIS — I10 Essential (primary) hypertension: Secondary | ICD-10-CM | POA: Insufficient documentation

## 2011-04-21 DIAGNOSIS — R05 Cough: Secondary | ICD-10-CM | POA: Insufficient documentation

## 2011-04-21 DIAGNOSIS — R110 Nausea: Secondary | ICD-10-CM | POA: Insufficient documentation

## 2011-04-21 DIAGNOSIS — R059 Cough, unspecified: Secondary | ICD-10-CM | POA: Insufficient documentation

## 2011-04-21 DIAGNOSIS — I2510 Atherosclerotic heart disease of native coronary artery without angina pectoris: Secondary | ICD-10-CM | POA: Insufficient documentation

## 2011-04-21 DIAGNOSIS — J449 Chronic obstructive pulmonary disease, unspecified: Secondary | ICD-10-CM | POA: Insufficient documentation

## 2011-04-21 DIAGNOSIS — F319 Bipolar disorder, unspecified: Secondary | ICD-10-CM | POA: Insufficient documentation

## 2011-04-21 DIAGNOSIS — R0789 Other chest pain: Secondary | ICD-10-CM | POA: Insufficient documentation

## 2011-04-21 DIAGNOSIS — J40 Bronchitis, not specified as acute or chronic: Secondary | ICD-10-CM | POA: Insufficient documentation

## 2011-04-21 DIAGNOSIS — J4489 Other specified chronic obstructive pulmonary disease: Secondary | ICD-10-CM | POA: Insufficient documentation

## 2011-04-21 DIAGNOSIS — R51 Headache: Secondary | ICD-10-CM | POA: Insufficient documentation

## 2011-04-21 DIAGNOSIS — R5381 Other malaise: Secondary | ICD-10-CM | POA: Insufficient documentation

## 2011-04-21 DIAGNOSIS — R0602 Shortness of breath: Secondary | ICD-10-CM | POA: Insufficient documentation

## 2011-04-21 DIAGNOSIS — J069 Acute upper respiratory infection, unspecified: Secondary | ICD-10-CM | POA: Insufficient documentation

## 2011-04-21 LAB — DIFFERENTIAL
Basophils Absolute: 0 10*3/uL (ref 0.0–0.1)
Basophils Relative: 0 % (ref 0–1)
Eosinophils Absolute: 0.2 10*3/uL (ref 0.0–0.7)
Monocytes Relative: 8 % (ref 3–12)
Neutro Abs: 3.9 10*3/uL (ref 1.7–7.7)
Neutrophils Relative %: 60 % (ref 43–77)

## 2011-04-21 LAB — BASIC METABOLIC PANEL
BUN: 9 mg/dL (ref 6–23)
Calcium: 8.9 mg/dL (ref 8.4–10.5)
Creatinine, Ser: 0.7 mg/dL (ref 0.4–1.5)
GFR calc non Af Amer: >60 mL/min (ref >60)
Glucose, Bld: 99 mg/dL (ref 70–99)
Potassium: 4 mEq/L (ref 3.5–5.1)

## 2011-04-21 LAB — CBC
Hemoglobin: 13.5 g/dL (ref 13.0–17.0)
Platelets: 276 10*3/uL (ref 150–400)
RBC: 4.52 MIL/uL (ref 4.22–5.81)
WBC: 6.5 10*3/uL (ref 4.0–10.5)

## 2011-04-21 LAB — PRO B NATRIURETIC PEPTIDE: Pro B Natriuretic peptide (BNP): 486.8 pg/mL — ABNORMAL HIGH (ref 0–125)

## 2011-04-21 LAB — CK TOTAL AND CKMB (NOT AT ARMC)
CK, MB: 3.3 ng/mL (ref 0.3–4.0)
Relative Index: 1.2 (ref 0.0–2.5)
Total CK: 274 U/L — ABNORMAL HIGH (ref 7–232)

## 2011-04-21 LAB — TROPONIN I: Troponin I: <0.3 ng/mL (ref <0.30)

## 2011-04-24 ENCOUNTER — Ambulatory Visit: Payer: Medicare Other | Attending: Internal Medicine | Admitting: Physical Therapy

## 2011-05-02 NOTE — Consult Note (Signed)
NAMEJALEEN, [name] NO.:  [id number]   MEDICAL RECORD NO.:  [id number]          PATIENT TYPE:  INP   LOCATION:  [id number]                         FACILITY:  [facility]   PHYSICIAN:  Felizardo Hoffmann, M.D.  DATE OF BIRTH:  17-Feb-1935   DATE OF CONSULTATION:  10/22/2008  DATE OF DISCHARGE:                                 CONSULTATION   HISTORY:  [name] received news that he will not be eligible for his  program in [place] that was going to involve long-term chemical  dependence rehabilitation therapy.  He has returned to hopelessness and  dysphoria.  He has decreased energy.  He has suicidal thoughts.  He  knows that he would be at risk to relapse.   He is agreeing to call the nursing station if he cannot resist any  suicidal impulses.  He is cooperative with care.  He is oriented to all  spheres and has intact memory function.  He is able to ambulate.  He is  able to participate in a psychiatric inpatient milieu.   REVIEW OF SYSTEMS:  No stiffness or other extrapyramidal symptoms with  his Seroquel.   PHYSICAL EXAMINATION:  VITAL SIGNS:  Temperature 97.9, pulse 78,  respiratory rate 20, blood pressure 156/80, O2 saturation on room air  99%.   MENTAL STATUS EXAM:  [name] has downcast facies.  He has constricted  affect, depressed mood.  His eye contact is good.  He is oriented to all  spheres.  His memory is intact to immediate recent and remote.  His  speech is soft.  There is no dysarthria.  Thought process is logical,  coherent and goal directed.  No looseness of associations.  Thought  content as above.  No current hallucinations or delusions.  Insight is  intact for the need of psychiatric treatment.  Judgment is intact for  functioning in a hospital milieu, however, it is not intact for  functioning outside the hospital.  He would be at risk for lethal self-  neglect as well as suicide.   ASSESSMENT:  293.83, mood disorder not otherwise specified  (idiopathic  and substance factors), depressed.  Alcohol dependence.  293.84, anxiety disorder not otherwise specified.   [name] is at risk for suicide outside of the supportive environment of  the hospital.  He is also at high risk for relapse of alcohol.   Therefore, the undersigned recommends admission as soon as possible to  an inpatient psychiatric unit for dual diagnosis treatment.  Would not  change his current psychotropic medication.   [name] does contract to notify the nursing station immediately if he  has any suicidal impulses that he cannot resist.  He is highly motivated  for continuing inpatient care.     Felizardo Hoffmann, M.D.  Electronically Signed    JW/MEDQ  D:  10/22/2008  T:  10/22/2008  Job:  [id number]

## 2011-05-02 NOTE — H&P (Signed)
[name], SNAY NO.:  [id number]   MEDICAL RECORD NO.:  [id number]          PATIENT TYPE:  IPS   LOCATION:  [id number]                          [facility]   PHYSICIAN:  Stark Jock, M.D. DATE OF BIRTH:  08-13-1935   DATE OF ADMISSION:  02/16/2009  DATE OF DISCHARGE:                       PSYCHIATRIC ADMISSION ASSESSMENT   IDENTIFICATION:  This is a 77 year old divorced African American male  from [place].   HISTORY OF PRESENT ILLNESS:  The patient states that he went to his  assisted living in [place] from his last admission here.  He  states he did not like it.  He was not getting his medications he says.  He began to feel suicidal, I feel like I want to end it.  He got out  of the assisted living and came back to [place].  He checked into a  motel room and got drunk according to the patient.  He has not seen his  family since out of the hospital and feels that they do not care about  his whereabouts.   PAST PSYCHIATRIC HISTORY:  The patient was last with [place] on January 17, 2009, to January 29, 2009.  He has been with [place] many times for suicidal  thoughts and alcohol use.  He was scheduled for follow up at [facility] in  [place], but did not comply.   FAMILY HISTORY:  Father had depression.  Mother had alcohol problems.   ALCOHOL AND DRUG HISTORY:  Family denies any current alcohol or drug  use.  He does have a history of substance abuse however.   MEDICAL PROBLEMS:  Hypertension, coronary artery disease.   MEDICATIONS:  The patient was on Colace 100 mg daily, Zyprexa 5 mg at  bedtime, Zoloft 125 mg daily, enteric-coated aspirin 81 mg daily, Coreg  9.375 mg p.o. b.i.d., thiamine 123XX123 mg daily, Mylicon chewable tablets  with meals and bedtime, Tylenol for pain, MiraLax for constipation.   DRUG ALLERGIES:  No known drug allergies.   PHYSICAL FINDINGS:  There were no acute physical or medical problems  noted.   ADMISSION MENTAL  STATUS EXAMINATION:  The patient presented as a  disheveled, older [place] American male with fair eye contact.  There  was psychomotor retardation and speech was soft and slow.  He was alert.  His mood was depressed and anxious.  Affect was consistent with mood.  He stated his anxiety level was severe.  Thought processes were  coherent.  Thought content, no predominant theme.  No auditory or visual  hallucinations.  No paranoia or delusions.  Judgment was poor.  Insight  absent.   ADMISSION DIAGNOSES:  Axis I:  Mood disorder, not otherwise specified.  History of alcohol dependence.  Axis II:  Features of borderline personality disorder.  Axis III:  Hypertension, coronary artery disease, and anemia.  History  of prostate cancer and degenerative disk disease.  Axis IV:  Severe (recent problems with housing, medical problems,  psychosocial problems, medication noncompliance, and lack of follow up  and burden of psychiatric illness).  Axis V:  Global assessment of functioning  was 35 upon admission, GAF  highest past year was 1.   HOSPITAL PLAN:  The patient's medications will be restarted.  He will  attend the unit therapeutic groups and activities.  I will gather  further history from him and any other care providers and the patient  will be on the Red Team for chemical dependence and the anticipated  length of stay is 3-5 days.   CONDITION:  He is ready for discharge, less depressed, less anxious, not  suicidal.   POSTHOSPITAL CARE PLAN:  The patient will return to outpatient therapy  and medication management at [facility].      Stark Jock, M.D.  Electronically Signed     BHS/MEDQ  D:  02/20/2009  T:  02/21/2009  Job:  [id number]

## 2011-05-02 NOTE — Consult Note (Signed)
NAMEJAMARR, PERSAD NO.:  [id number]   MEDICAL RECORD NO.:  [id number]          PATIENT TYPE:  INP   LOCATION:  [id number]                         FACILITY:  [facility]   PHYSICIAN:  Felizardo Hoffmann, M.D.  DATE OF BIRTH:  November 03, 1935   DATE OF CONSULTATION:  10/19/2008  DATE OF DISCHARGE:                                 CONSULTATION   REFERRING PHYSICIAN:  Rico Sheehan, D.O.   REASON FOR CONSULTATION:  Depression, suicide attempt.   HISTORY OF PRESENT ILLNESS:  [name] is a 76 year old male  admitted to the [facility] on October 18, 2008 due to an overdose of  Seroquel and trazodone.   [name] was recently in psychiatric hospital.  He was found  unconscious.  After he became alert, he acknowledged that he made a  suicide attempt.  He describes no new psychosocial stresses or medical  problems.  He has been homeless however, and he has experienced several  weeks of depressed mood, insomnia, decreased energy, poor concentration  and suicidal thoughts.  He has not improved in his mood with social and  psychological support.  Also, he continued to be depressed despite a  recent psychiatric hospitalization with a prescription of trazodone and  Seroquel.   PAST PSYCHIATRIC HISTORY:  In review of the record that is available,  which is an emergency room record there is no report of any previous  psychotropic trials.  The patient does not recall the one medication  that was prescribed which was associated with an improvement in his  depression.   He states that his first depressed episode was in the 1960s.  He does  not know what medication he was treated with at that time.  He had  experienced several episodes of major depression.  He also has attempted  suicide at least 6 times.  He has never experienced any decreased need  for sleep or increased energy.   He has experienced very negative hallucinations in an auditory form  during depression.  He has  experienced at least 6 psychiatric  admissions.   When several psychotropic medications were reviewed with the patient  seemed to recall Prozac and thought it was not effective.  However, he  could not recall the names of any other medications.   He states that a successful treatment occurred when he was seeing  Chucky May, M.D., but he does not recall that medication.   [name] does have many worries.  He has feeling on edge and muscle  tension.   FAMILY PSYCHIATRIC HISTORY:  None known.   SOCIAL HISTORY:  [name] does have five children.  He is homeless.  He  is currently single.  He does have a history of heavy alcohol use.  He  currently states that he does not drink everyday, but when he does drink  he drinks approximately three 40-ounce malt liquor cans.  He does not  use illegal drugs.  He is retired from [employer].  He does have  five children.   PAST MEDICAL HISTORY:  1. Coronary artery disease.  2. Hypertension.   MEDICATIONS:  The MAR is  reviewed.  [name] is on:  1. Nicoderm 21 mg patch daily.  2. Seroquel 50 mg nightly.  3. Ativan 1 mg q.4h. p.r.n.  4. Thiamin 100 mg daily.   He has no known drug allergies.   LABORATORY DATA:  Head CT without contrast shows no acute abnormality.   LABORATORY DATA:  Alcohol was 14 by the time he arrived to the emergency  room.  WBC 10.3, hemoglobin 11.8, platelet count 271.  Sodium 138, BUN  13, creatinine 1, glucose 92.  SGOT 27, SGPT 29.  Urine drug screen  positive for benzodiazepines.  Tylenol, aspirin and lithium  unremarkable.  The EKG on November 2, showed a QGC of 435 milliseconds.   REVIEW OF SYSTEMS:  CONSTITUTIONAL, HEAD, EYES, EARS, NOSE, THROAT,  MOUTH, NEUROLOGIC, PSYCHIATRIC, CARDIOVASCULAR, RESPIRATORY,  GASTROINTESTINAL, GENITOURINARY, SKIN, MUSCULOSKELETAL, HEMATOLOGIC,  LYMPHATIC, ENDOCRINE, METABOLIC:  All unremarkable.   PHYSICAL EXAMINATION:  VITAL SIGNS:  Temperature 97.6, pulse 77,   respiratory rate 20, blood pressure 120/61, O2 saturation on room air  97%.  GENERAL APPEARANCE:  [name] is a elderly male lying in a supine  position in his hospital bed with no abnormal involuntary movements.   MENTAL STATUS EXAM:  [name] is alert.  His eye contact is good.  His  attention span is mildly decreased.  Concentration mildly decreased.  Affect is constricted.  Mood is depressed.  He is oriented to all  spheres.  Memory is intact to immediate, recent and remote except for  the overdose black-out.  Speech involves normal rate and prosody without  dysarthria.  His facies are very downcast.  His thought process is  logical, coherent, goal directed.  No looseness of associations.  Thought content, he does acknowledge suicidal intent however, he does  want to get help and is cooperative with his current care and plans to  be admitted to a psychiatric ward for further evaluation and treatment.  He does not have any hallucinations or delusions.  Insight is intact for  his depression.  Judgment is impaired for functioning outside of an  institution.  However, it is intact for cooperating with appropriate  care.   ASSESSMENT:  AXIS I:  293.83.  1. Mood disorder, not otherwise  specified, depressed.  296.33.  2.  Major depressive disorder,  recurrent, severe.  293.84.  3.  Anxiety disorder, not otherwise  specified.  4.  Alcohol dependent.  AXIS II:  Deferred.  AXIS III:  See past medical history.  AXIS IV:  1.  Primary support group.  2. Economic.  3.  General medical.  AXIS V:  30.   [name] is at risk to harm himself.   The undersigned provided ego supportive therapy and education.   RECOMMENDATIONS:  1. I have asked the social worker to obtain release for Dr. Starleen Arms      records to determine which medication was associated with an      improvement.  2. I would continue suicide precautions.  3. I would admit to an inpatient psychiatric unit as soon as possible.  4.  Concur with Ativan p.r.n. withdrawal, would utilize 1-2 mg q.4h.      p.r.n. would use caution about excessive sedation and imbalance      with the Ativan.  5. Continue ego support.  If the patient remains on the general      medical ward for more than 1 day would ask  the social worker to      obtain 12-step material.      Felizardo Hoffmann, M.D.  Electronically Signed     JW/MEDQ  D:  10/19/2008  T:  10/19/2008  Job:  [id number]   cc:   Rico Sheehan, D.O.

## 2011-05-02 NOTE — Discharge Summary (Signed)
[name], [name] NO.:  [id number]   MEDICAL RECORD NO.:  [id number]          PATIENT TYPE:  IPS   LOCATION:  [id number]                          [facility]   PHYSICIAN:  Stark Jock, M.D. DATE OF BIRTH:  1934/12/19   DATE OF ADMISSION:  02/16/2009  DATE OF DISCHARGE:  03/10/2009                               DISCHARGE SUMMARY   IDENTIFICATION:  This is a 76 year old divorced African American male  from [place], who was admitted on a voluntary basis.   HISTORY OF PRESENT ILLNESS:  The patient came into the hospital after  having checked himself in a motel room and gotten drunk.  He became  depressed and suicidal I feel like I want to ended it.  For further  admission information, see the psychiatric admission assessment.   PHYSICAL FINDINGS:  There were no acute physical or medical problems  noted.   ADMISSION LABORATORIES:  Comprehensive metabolic panel was notable for  slightly increased glucose of 102 (70-99).  Elevated alkaline  phosphatase of 131 (39-117).  Blood albumin 3.5 to 5.2.   HOSPITAL COURSE:  Upon admission, the patient was restarted on his home  medications of enteric coated aspirin 81 mg, Coreg 9.375 mg b.i.d.,  Colace 123XX123 mg daily, Mylicon 80 mg p.o. t.i.d. and h.s. He was also  started on Rozerem 8 mg p.o. q.h.s. p.r.n. and Librium 25 mg p.o. q.6 h.  p.r.n. withdrawal.  He was also started on Zyprexa 5 mg p.o. q.h.s. and  5 mg p.o. q.6 h. p.r.n. agitation, and Zoloft 100 mg daily.  The patient  continued to be a severely depressed and anxious with suicidal ideation  throughout the first 3 weeks of his hospitalization.  On February 20, 2009,  he was started on Neurontin 100 mg p.o. q.4 h. p.r.n. anxiety.  On February 21, 2009, due to continued severe anxiety, Neurontin was increased to 300  mg p.o. q.4 h.  Eventually, the Neurontin was switched to standing dose  300 mg p.o. t.i.d. and 300 mg p.o. q.h.s.  As he remains depressed and  anxious and suicidal, his sertraline was increased to 150 mg p.o. q.h.s.  Zyprexa was increased to 5 mg p.o. q.4 h. p.r.n. anxiety.  On February 28, 2009, Librium dose was changed to Librium 10 mg t.i.d. p.r.n. anxiety.  On March 01, 2009, b.i.d. and on March 02, 2009, then discontinue.  The  patient continued to be depressed and anxious, feeling hopeless and  worthless. On March 03, 2009, he was placed on Zyprexa 5 mg p.o. q.i.d.  on March 06, 2009, Neurontin was changed to 400 mg p.o. t.i.d. and 400  mg p.o. q.h.s. to do with his excessive anxiety. On March 08, 2009,  sleep was good and appetite was good.  Mood was becoming less depressed  and less anxious.  He did not have suicidal ideation.  He stated he  planned to go to live with a friend in [place], [place] and was  looking forward to this.  As hospitalization progressed, he remained  less depressed and less anxious.  On March 10, 2009, patient was ready  to go home.  He made plans to go to [place], [place] on the train to  stay with some friends.  His sleep was good and appetite was good.  Mood  was less depressed and less anxious.  Affect was consistent with mood.  There was no suicidal or homicidal ideation.  No thoughts of self-  injurious behavior.  No auditory or visual hallucinations.  No paranoia  or delusions.  Thoughts were logical and goal-directed.  Thought  content, no predominant theme.  Cognitive was grossly intact.  Insight  good.  Judgment good.  Impulse control good.  It was felt, the patient  was safe for discharge today.   DISCHARGE DIAGNOSES:  Axis I:  Mood disorder, not otherwise specified.  History of alcohol dependence.  Axis II:  Features of dependent personality disorder.  Axis III:  Hypertension, coronary artery disease, anemia, history of  prostate cancer, and degenerative disk disease.  Axis IV:  Severe (recent problems with housing, medical problems,  psychosocial problems, medication compliance  and lack follow up, and  burden of psychiatric illness).  Axis V:  Global assessment of functioning was 50 upon discharge.  GAF  was 35 upon admission.  GAF highest past year was 48.   DISCHARGE PLANS:  There was no specific activity level or dietary  restrictions.   POSTHOSPITAL CARE PLANS:  The patient will go to [place] for  followup on March 25th at 9 a.m.  If he remains in [place], if not,  he will begin treatment in [place].   DISCHARGE MEDICATIONS:  1. Aspirin 81 mg daily.  2. Coreg 3.125 take 3 tablets twice a day.  3. Colace 100 mg daily.  4. Zyprexa 4 times a day.  5. Gabapentin 400 mg 3 times a day and at bedtime.  He was given a 7-      day supply of medications.      Stark Jock, M.D.  Electronically Signed     BHS/MEDQ  D:  03/10/2009  T:  03/11/2009  Job:  [id number]
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
## 2011-05-02 NOTE — H&P (Signed)
[name], GERSH NO.:  [id number]   MEDICAL RECORD NO.:  [id number]          PATIENT TYPE:  IPS   LOCATION:  0406                          [facility]   PHYSICIAN:  Norm Salt, MD  DATE OF BIRTH:  1935/11/22   DATE OF ADMISSION:  06/16/2009  DATE OF DISCHARGE:                       PSYCHIATRIC ADMISSION ASSESSMENT   This is a 76 year old male voluntarily admitted June 16, 2009.   HISTORY OF PRESENT ILLNESS:  The patient is a transfer from the Medical  Unit after he was admitted for a syncopal episode.  The patient was  medically cleared.  He does have a history of multiple medical problems.  He states that he is drinking as much as he can get, having some  depressive symptoms, passive suicidal thoughts stating I am done, I am  finished.  He states he has nothing to live for, feeling hopeless.  No  support and no future goals.   PAST PSYCHIATRIC HISTORY:  The patient has been here several times.  He  was last here in March 2010.  He was discharged to an assisted living  facility that he was unhappy with.   SOCIAL HISTORY:  A 76 year old single male.  He has 5 children that he  has some contact with.  He has been staying in a motel prior to this  admission and considers himself homeless now.   FAMILY HISTORY:  None.   ALCOHOL AND DRUG HISTORY:  As above.  He has been drinking and using  marijuana.   PRIMARY CARE [name]:  [facility].   MEDICAL PROBLEMS:  Uncontrolled hypertension, coronary artery disease.   DRUG ALLERGIES:  No known drug allergies.   MEDICATIONS:  The patient was discharged on:  1. Aspirin 81 mg daily.  2. Coreg 6.25 mg b.i.d.  3. Folic acid 1 mg daily.  4. Hydralazine 25 mg t.i.d.  5. Ativan taper.  6. Multivitamin daily.  7. Protonix 40 mg q.12 h.  8. Thiamine 100 mg daily.   PHYSICAL FINDINGS:  This is an elderly male fully assessed at [facility].  He does present today with some abrasions to his face  were he  stated that was when he fell.  Otherwise, he appears well nourished and  in no acute distress.   LABORATORY DATA:  Shows a urine drug screen positive for marijuana.  CBC  within normal limits.  Alcohol level of 115.  A CMET shows a glucose of  102 with an alkaline phosphate of 131.   MENTAL STATUS EXAM:  The patient is in his room, fully alert,  cooperative, dressed in a gown.  Very little eye contact.  Talks about  the situation, feeling very hopeless and helpless, endorsing passive  suicidal thoughts but promises safety.  He has no delusional statements.  His memory appears intact.  Judgment and insight are fair.   AXIS I:  Major depressive disorder, polysubstance abuse.  AXIS II:  Deferred.  AXIS III:  Coronary artery disease, hypertension.  AXIS IV:  Problems related to housing, medical problems, psychosocial  problems.   Our plan  is to continue the patient's discharge medications.  We will  continue to address his substance use.  Case manager will assess his  living situation.  We will continue to identify support, reinforce med  compliance and follow-ups.  Tentative length of stay is 3-5 days.      Redgie Grayer, N.P.      Norm Salt, MD  Electronically Signed    JO/MEDQ  D:  06/17/2009  T:  06/17/2009  Job:  [id number]
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
## 2011-05-02 NOTE — Discharge Summary (Signed)
NAMEED, ANNESS                 ACCOUNT NO.:  [id number]   MEDICAL RECORD NO.:  [id number]          PATIENT TYPE:  INP   LOCATION:  [id number]                         FACILITY:  [facility]   PHYSICIAN:  Rico Sheehan, D.O.  DATE OF BIRTH:  [birth year]   DATE OF ADMISSION:  10/18/2008  DATE OF DISCHARGE:  10/20/2008                               DISCHARGE SUMMARY   DISCHARGE DIAGNOSES:  1. Suicide attempt by medication overdose, currently medically stable.  2. Incidental finding of meningioma, stable.  3. History of multiple admissions to inpatient psychiatry unit for      major depressive disorder, suicidal ideation, and schizo affective      disorder.  4. Multivessel coronary artery disease status post right coronary      artery stent in 2005, with ejection fraction of 50-55%.  5. Blindness in left eye secondary to childhood trauma.  6. History of prostate cancer status post radiation therapy.  7. The patient has 2 different medical record numbers on file.   DISCHARGE MEDICATIONS:  1. Aspirin 81 mg p.o. daily  2. Coreg 3.125 mg b.i.d.   DISPOSITION AND FOLLOW UP:  The patient is to be discharged directly to  an inpatient psychiatry unit in [place] by the name of [facility] after discharge from [facility].  The patient is to  spend a total of 90 days there under supervision.  If the patient  decides to return to [place] at the completion of his [facility] stay, he can call the [facility] to set up a  appointment with Dr. Alfonse Alpers for further follow-up of his medical  issues.  The number to the outpatient clinic is [phone].   PROCEDURES PERFORMED:  1. CT of the head without contrast performed October 18, 2008 was      negative for bleed or other acute intracranial process.  A 2.7 cm      partially calcified mass adjacent to the tentorium on the left was      noted.  This is probably extra-axial lesion, such as meningioma.  2.  MRI of the brain with and without contrast performed on November 18, 2008 showed a 2.9 x 1.5 x 2.5 cm meningioma along the superior      aspect of the tentorium on the left.  Most of the mass is superior      to the tentorium but a small amount wraps around anteriorly to the      anterior aspect of the tentorium.  There is no evidence of small or      large reversible infarction, intra-axial mass lesion, hemorrhage,      hydrocephalus, or extra-axial collection.  No second meningioma is      identified anywhere else.  The pituitary gland is normal.  There is      no mass effect seen upon the brainstem.  However, the meningioma      has a small mass effect upon the left posterior medial temporal      lobe.  No skull or skull  based lesions were seen.   CONSULTATIONS:  1. Dr. Rhona Raider from psychiatry.  2. Dr. Hal Neer from [facility].   ADMITTING HISTORY AND PHYSICAL:  Mr. Mavity is a 76 year old African  American male with a past medical history of multiple admissions to  behavioral health for major depression disorder, suicidal ideation, &  schizoaffective disorder, history of coronary disease status post  stenting in 2005, and history of prostate cancer s/p radiation therapy,  who intentionally overdosed on a handful of Seroquel and possibly  trazadone in an attempt to harm himself at least 6 to 8 hours prior to  admission.  The patient states that he was feeling depressed and wanted  to end the pain as he was having trouble in his personal life, and  therefore decided to ingest all of the 7-day course of Seroquel and  another drug (most likely trazadone) that he was recently discharged  home on from a recent admission to the [facility].  The patient also admits to drinking alcohol at the time and  does not recall exactly how much he ingested prior to coming into the  hospital.  After ingestion, the patient called one of his friends to  say  good-bye.  The patient called EMS and when EMS arrived at the  patient's residence, found him to be very drowsy and minimally  responsive.  The patient was subsequently transported to the [facility]  Emergency Room for further evaluation and treatment.   PHYSICAL EXAM:  VITAL SIGNS:  The patient had a temperature of 98.3,  blood pressure 123/55, pulse of 91, respiratory rate of 20, satting 97%  on room air.  GENERAL:  He was very drowsy but arousable.  The patient was easily  agitated by questioning.  However, he did not seem to be in any acute  distress.  HEENT:  The patient had some slurred speech and was normocephalic,  atraumatic.  HEENT exam showed extraocular motions to be intact with  decreased visual acuity in the left eye with a corneal abrasion that was  not new.  The right pupil was moderately constricted but still reactive  to light.  The patient had muddy sclerae with no conjunctival injection.  Oropharynx and oral cavity were clear.  The patient had very dry mucous  membranes and poor dentition was also noted.  NECK:  The patient's neck was supple with no JVD or lymphadenopathy.  RESPIRATORY:  The patient had no increased work of breathing and did not  appear to be in any respiratory distress.  However, he was noted to have  some coarse breath sounds at the right lung field diffusely but no  wheezing or crackles were noted.  CARDIOVASCULAR:  Revealed distant heart sounds but regular rate and  rhythm and no murmurs, rubs or gallops.  GASTROINTESTINAL:  Exam showed the patient to have hypoactive bowel  sounds.  Soft, nontender, nondistended abdomen.  EXTREMITIES:  Exam showed no edema in the bilateral lower extremities.  SKIN:  Was noted for being warm and dry and no lesions or rashes were  seen.  NEURO:  Showed the patient to be drowsy but arousable with slurred  speech.  Upon questioning, the patient was found to be oriented x3 with  cranial nerves II-XII to be intact  except for vision in the left eye.  The patient had 5/5 strength and normal sensation to light touch  throughout.  The patient had normal finger-to-nose exam in right upper  extremity  but a slight over-reach in his left upper extremity.  PSYCH:  Exam showed the patient to be agitated and the patient seemed to  be very distressed that he was in the hospital.  The patient seemed  upset that he had survived his overdose.   ADMISSION LABS:  A white blood cell count of 9.9 with an ANC of 8.6,  hemoglobin of 13.2, with MCV of 87, platelets of 297,000.  Sodium was  132, potassium was 3.7, chloride was 101, bicarb was 25, glucose was  121, BUN was 15, creatinine was 1.14, total bilirubin 0.9, alk phos 108,  AST 28, ALT 27, total protein 5.8, albumin 3.1, calcium 8.4.  The  patient had an ethanol level of 14.  Urine drug screen was positive for  benzo.  Serum osmolality was 287.  BNP was less than 30.  Serum lactic  acid level was 3.9.  Serum lithium, Tylenol and salicylate levels were  negative.  Urinalysis showed trace blood, 100 protein, hyaline casts.  TSH was 0.87 and B12 was 306, RBC folate was 826.  Urine was negative  for tricyclics.   HOSPITAL COURSE BY PROBLEM:  1. Altered Mental Status Secondary to Seroquel and Trazodone Overdose:      This all occurred the night prior to admission.  The patient was      initially found to be very somnolent and minimally responsive by      EMS.  However, when he was seen in the emergency department 6 to 8      hours later, the patient was more awake and much easier to arouse.      The patient was not completely cooperative to questioning but would      answer when pressed and was also only moderately cooperative to      physical exam at that time.  At the time he presented to the      emergency department, it was thought that the patient was too far      out in time for any effect to be seen by administering activated      charcoal.  A CT of the head  was performed to rule out other causes      of his altered mental status.  However, no acute changes were      noted.  In addition his B12, RPR, folate, and TSH levels were also      checked and found to be normal.  The patient's EKGs were cycled Q8      hours and no changes were noted in QRS duration or his QT interval.      The patient's urine drug screen was negative for lithium, ASA,      acetaminophen, and tricyclics.  However, it was positive for      benzos.  The patient was given supportive management and his oxygen      saturation and respiratory status were carefully monitored      throughout his hospital course.  However the patient did not      require intubation and maintained good perfusion respiratory drive      throughout his hospital course.  Since Seroquel and trazodone have      relatively short half-lives, the patient was considered to be out      of the acute phase by the evening of his admission.  He remained      stable throughout the remainder of his hospital course and his  mental status improved back to baseline within 24 hours without any      major interventions.  Appropriate precautions were made including      the use of a 24-hour sitter and suicide precautions since the      patient's overdose was intentional and since the patient has a      complicated past psychiatric history.  By day of discharge, the      patient was medically stable, and stated that he was feeling more      hopeful for the future.  Psychiatry was consulted during his      hospitalization and recommended the patient be observed and treated      for his depression and suicidal ideation at an approved inpatient      facility directly after his discharge.  Therefore, social work was      consulted and the patient was found a 90-day program which he could      join in a different state since he did not want to be taken back to      the [facility].  2. Incidental  Finding of a Meningioma:  The patient had an initial CT      scan in the emergency room to rule out an acute hemorrhage causing      his altered mental status which came back negative.  However, the      CT did pick up a small partially calcified mass in the back of his      brain.  A followup MRI with contrast was performed and confirmed      that the patient had a small meningioma in the posterior aspect of      his left tentorium.  Neurosurgery was consulted and indicated that      the patient's meningioma was most likely benign and conservative      therapy was the best course of action with a followup MRI suggested      for 6 months post-discharge.  The patient had no focal neurological      deficits or clinical manifestations from his meningioma and there      was no concern for serious mass effect or disability stemming      directly from this.  3. Coronary Artery Disease:  The patient has a remote history of      coronary artery disease status post stenting of the right coronary      artery in 2005.  However, no actual records of his catheterization      could be found.  The patient did not appear to be on any      cardioprotective medications upon admission, and he could not      remember if he was ever placed on anything in the past.  Therefore,      it was decided that the patient should at least be started on a      short course of low-dose daily aspirin and a low-dose beta blocker      for cardiac protection upon discharge given his CAD history.  It      should be determined at a later date whether or not these      medications should be continued on a long-term basis since      especially if the pros of providing him with medications for      cardioprotection are justified in the setting of potential overdose      of  of them in the future.  4. Polysubstance Abuse with Alcohol and Tobacco:  The patient was      started on prophylactic thiamine and folic acid and a smoking       cessation consult was ordered.  The patient was provided a nicotine      patch during hospitalization as well.  He had a CIWA protocol      started for monitoring only, as the patient was found to have a      positive alcohol level.  However the patient did not become      agitated in any way during his hospitalization and remained stable      in regards to any type of withdrawal symptoms.   DISCHARGE LABS AND VITALS:  On day of discharge the patient had a  temperature of 98 degrees, blood pressure of 154/71, pulse of 65,  respirations of 20, satting 97% on room air.  The patient had a white  blood cell  count of 8, hemoglobin of 11.4, platelets of 266,000.  Sodium was 141,  potassium was 4.1, chloride was 105, bicarb was 29, BUN was 7,  creatinine 0.82, glucose 87, calcium 8.6, serum lactic acid was 3.2.  Fasting lipid profile showed the patient to have a total cholesterol of  140, triglycerides of 29, HDL of 56, LDL 78.      Stephanie Coup, MD  Electronically Signed      Rico Sheehan, D.O.  Electronically Signed    RK/MEDQ  D:  10/20/2008  T:  10/20/2008  Job:  [id number]   cc:   Chucky May, M.D.

## 2011-05-02 NOTE — H&P (Signed)
NAMECAPRICE, DRAKOS                 ACCOUNT NO.:  [id number]   MEDICAL RECORD NO.:  [id number]          PATIENT TYPE:  INP   LOCATION:  4729                         FACILITY:  [facility]   PHYSICIAN:  Remer Macho, MD        DATE OF BIRTH:  1935/03/13   DATE OF ADMISSION:  06/12/2009  DATE OF DISCHARGE:                              HISTORY & PHYSICAL   CHIEF COMPLAINT:  Syncope.   HISTORY OF PRESENT ILLNESS:  The patient is a 76 year old African  American male with a known history of coronary artery disease,  hypertension, and alcohol abuse in addition to depression, who presented  to the ER earlier today with complaints of a syncopal event.  The  patient was evaluated in the ER, had a workup which apparently included  CAT scan of the head that revealed mild comminuted anterior nasal bone  fracture and triangular-shaped fracture fragment or foreign body lateral  to the posterior aspect of nasal bone on the right, mild atrophy and no  other acute findings.  A CT scan of the head revealed dense bilateral  carotid artery atheromatous calcification.  The patient was discharged  with a bus pass; however, he returned back after he had another episode  of syncope, which was not witnessed.  The patient unsure as to what  happened.  At the time of this interview, he complains of chest pain as  well and states that he has mild shortness of breath.   PAST MEDICAL HISTORY:  Significant for:  1. Hypertension.  2. Coronary artery disease.  3. Anemia.  4. History of prostate cancer.  5. History of degenerative joint disease.  6. History of depression.  7. History of dependent personality disorder.  8. History of alcohol abuse.   PAST SURGICAL HISTORY:  History of carotid angioplasty with right  coronary artery stent placement in February 2005.   FAMILY HISTORY:  Significant for his mother dying of acute alcoholic  intoxication at age 74.  Father died at age 76 from an MI after he  stopped taking  medications.   SOCIAL HISTORY:  The patient lives in [place].  He is separated from  his wife.  He has 5 children.  Smokes half a pack per day.  He has about  50-pack-a-year history of smoking.  Drinks alcohol on a regular basis,  last drink was this morning.   CURRENT MEDICATIONS:  None.   ALLERGIES:  No known drug allergies.   REVIEW OF SYSTEMS:  An extensive review of systems was done and all  systems are negative except for positives mentioned in the history of  present illness.  The patient denies any recent history of cough, fever,  rigors, chills, abdominal pain, nausea, vomiting, diarrhea, or any  urinary symptoms.   PHYSICAL EXAMINATION:  VITAL SIGNS:  Pulse 87, blood pressure 135/67,  respiratory rate of 24, temperature 97.1, and O2 sats 94% on room air.  GENERAL:  The patient is conscious, alert, oriented to person, has  significant deficits in both recent as well as remote memories.  Multiple skin ulcerations from  the fall noted on the forehead and nose.  HEENT:  No scleral icterus.  No pallor.  Ears negative.  Poor dental  hygiene.  NECK:  Supple.  No lymphadenopathy.  No JVD.  Positive carotid bruit.  CHEST:  Breath sounds heard bilaterally.  [employer] air entry.  Bilateral  rhonchi.  CVS:  S1 and S2 plus regular.  No gallop or rub.  ABDOMEN:  Soft, obese, and nontender.  Bowel sounds present.  EXTREMITIES:  No cyanosis, clubbing, or edema.  NEUROLOGIC:  Cranial nerves II-XII are grossly intact.  The patient  moves all 4 extremities.  Plantars are bilaterally downgoing.  The  patient has difficulty doing the finger-nose testing.  Gait was not  tested due to the patient's fall risk.  Pertinent to mention here that  the patient is blind in his left eye, status post traumatic injury in  the past.   LABORATORY DATA:  CT of head showed mildly comminuted anterior nasal  bone fracture, triangular-shaped fracture fragment or foreign body  lateral to the posterior aspect of  nasal bone on the right.  Mild  atrophy.  CT of the cervical spine showed no fracture or subluxation,  multilevel degenerative changes.  Klippel-Feil deformity at C5-C7, 2-cm  left thyroid mass.  Correlation with elective thyroid ultrasound is  recommended, and dense bilateral carotid artery erythematous  calcification.  CK was 2.3, troponin less than 0.05, myoglobin was 178.  Urinalysis showed 0-2 wbc's, 0-2 rbc's, negative nitrite, and negative  leukocyte esterase.  Urine drug screen was positive for  tetrahydrocannabinoids.  Alcohol level was 115.  WBC count was 6.3,  hemoglobin 15.9, hematocrit 47.5, and platelet count of 301.  Sodium was  142, potassium 4.0, chloride 106, glucose 92, BUN 7, and creatinine 1.2.  EKG showed normal sinus rhythm at a rate of 86, normal PR interval,  normal QRS duration, nonspecific T-wave changes.   IMPRESSION AND PLAN:  This is a case of a 76 year old African American  male with known history of hypertension, coronary artery disease, and  alcohol abuse in addition to noncompliance with medications who presents  today with syncope and is complaining of chest pain at the time of this  interview.  1. Syncope.  The patient does have acute alcohol intoxication with      alcohol level of 116; however, given his history of coronary artery      disease and noncompliance with medications, we need to rule out      arrhythmia as a cause of his syncope.  He will be admitted to      telemetry for overnight observation and we will monitor him      closely.  We will also check orthostatics and follow up.  2. Chest pain, new onset.  However, EKG does not show any significant      changes.  Initial cardiac enzymes were also reported negative.  We      will recycle enzymes and follow up.  The patient will be put on      nitro paste and we will schedule him for a 2-D echo in the morning.  3. Acute alcohol intoxication.  We will start the patient on banana      bag.  We  will put him on daily thiamine and folate and use Ativan      on a p.r.n. basis for DT, i.e., delirium tremens prophylaxis.  4. Nasal bone fractures.  May need to consult ENT in the morning for  their opinion and the patient may need outpatient followup.  5. Deep vein thrombosis/gastrointestinal prophylaxis.  Protonix and      subcu heparin.      Remer Macho, MD  Electronically Signed    BA/MEDQ  D:  06/12/2009  T:  06/13/2009  Job:  [id number]

## 2011-05-02 NOTE — H&P (Signed)
NAMEARTEMUS, [name] NO.:  [id number]   MEDICAL RECORD NO.:  [id number]          PATIENT TYPE:  IPS   LOCATION:  0306                          FACILITY[facility]   PHYSICIAN:  Stark Jock, M.D. DATE OF BIRTH:  11/21/1935   DATE OF ADMISSION:  01/17/2009  DATE OF DISCHARGE:                       PSYCHIATRIC ADMISSION ASSESSMENT   This is on a 76 year old male who was voluntarily admitted on January 17, 2009.   HISTORY OF PRESENT ILLNESS:  The patient was recently released from  [facility] on January 6.  He states that he was unable to do his  followup with his appointments because of physical and emotional  problems.  He ran out of his medications and was feeling suicidal  again.  He states he blew his money on his girlfriend who uses  substances, but he himself denies any recent alcohol or drug use.   PAST PSYCHIATRIC HISTORY:  The patient has been here with his last  admission being in October 2009 for suicidal thoughts and alcohol use.  The patient was scheduled to see Dr. Adele Schilder for followup but again did  not make that appointment.   SOCIAL HISTORY:  A 76 year old male who considers himself homeless at  this time living in [place].   FAMILY HISTORY:  Father with depression.  Mother with alcohol problems.   ALCOHOL AND DRUG HISTORY:  The patient denies any current alcohol or  drug use.   PRIMARY CARE Keevan Wolz:  The patient was scheduled to see Dr. Rennis Harding in  [facility] at phone number [phone].   MEDICAL PROBLEMS:  1. Hypertension.  2. Coronary artery disease.   MEDICATIONS:  The patient was discharged from [facility] on the  following medications.  The patient was discharged on:  1. Colace 100 mg daily.  2. Zyprexa 5 mg at bedtime.  3. Zoloft 125 mg daily.  4. Enteric-coated aspirin 81 mg daily.  5. Coreg 9.375 mg p.o. b.i.d.  6. Thiamine 100 mg daily.  7. Mylicon chewable tabs with meals and bedtime.  8.  Tylenol for pain.  9. He received a 1-week prescription for oxycodone 10 mg t.i.d.  10.MiraLax daily for constipation.   DRUG ALLERGIES:  No known allergies.   PHYSICAL EXAM:  This is an elderly male, overweight, assessed with no  significant findings.  He appears well-nourished.  Temperature 97.9, 52  heart rate, 20 respirations, blood pressure is 170/76.  Negative  neurological findings.   LABORATORY DATA:  Shows a potassium of 3.1, glucose of 109, hemoglobin  of 12, with hematocrit of 36.   MENTAL STATUS EXAM:  He is fully alert, cooperative, fair eye contact.  Speech is clear, normal pace and tone.  The patient's mood is depressed,  frustrated.  Affect is depressed.  His thought processes are coherent  and goal directed.  No evidence of any delusional statements, denies any  current suicidal thoughts.  Cognitive function intact.  His memory  appears to be good.  He is a questionable historian.   Axis I:  A.  Major depressive disorder.  B.  History of alcohol dependence in partial remission.  Axis II:  Deferred.  Axis III:  A.  Hypertension.  B.  Coronary artery disease.  C.  Anemia.  D.  History of prostate cancer.  E.  Degenerative disk disease.  Axis IV:  Possible problems with housing, medical problems, psychosocial  problems, medication noncompliance, and lack of followup.  Axis V:  Current is 30.   PLAN:  1. Contract for safety.  2. Stabilize mood and thinking.  3. Will resume patient's medications.  4. We will hold his oxycodone at this time as the patient received a 1      week's supply of medication.  5. Will reinforce med compliance and follow-up visits.  6. Will continue to identify comorbidities and reinforce again follow-      up visits.  7. Case manager will assess his housing situation.   His tentative length of stay at this time is 3-5 days.      Redgie Grayer, N.P.      Stark Jock, M.D.  Electronically Signed    JO/MEDQ  D:   01/20/2009  T:  01/20/2009  Job:  [id number]

## 2011-05-02 NOTE — Discharge Summary (Signed)
[name], [name]                 ACCOUNT NO.:  [id number]   MEDICAL RECORD NO.:  [id number]          PATIENT TYPE:  INP   LOCATION:  4738                         FACILITY:  [facility]   PHYSICIAN:  Jimmey Ralph, MD  DATE OF BIRTH:  1935-08-20   DATE OF ADMISSION:  06/12/2009  DATE OF DISCHARGE:                               DISCHARGE SUMMARY   PRIMARY CARE PHYSICIAN:  Unassigned.   DISCHARGE DIAGNOSES:  1. Alcohol intoxication.  2. Alcohol dependence.  3. Suicidal ideation.  4. Major depression.  5. Uncontrolled hypertension.   MEDICATIONS UPON DISCHARGE:  1. Aspirin 81 mg p.o. daily.  2. Coreg 6.25 mg p.o. b.i.d.  3. Folic acid 1 mg p.o. daily.  4. Hydralazine 25 mg p.o. t.i.d.  5. Ativan 1 mg q.8 h. on June 15, 2009, and 1 mg q.12 h. on June 16, 2009, and 1 mg q.24 h. on June 17, 2009.  6. Multivitamins 1 tablet p.o. daily.  7. Protonix 40 mg p.o. q.12 h.  8. Thiamine 100 mg p.o. daily.   COURSE DURING THE HOSPITAL STAY:  Mr. Kopper is a 76 year old African  American gentleman patient with known history of coronary artery  disease, hypertension, alcohol dependence, was admitted to [facility]  with complaints of syncope.  The patient was evaluated in the ER and had  a CT scan, which revealed mild comminuted fracture to the nasal bones.  The patient was admitted to the telemetry bed.  Cardiac enzymes were  cycled x3, which were negative.  Also, the patient has had no telemetry  events through the stay in the hospital.  The patient had a 2-D echo  done in view of syncope, which showed EF of 50-55% with normal cavity  size and mild LVH.  The patient also had a carotid Doppler done, which  showed no ICA stenosis with good vertebral flows.  The patient was  evaluated, was kept on a one-to-one watch in view of his suicidal  ideation and had Dr. Rhona Raider consult for suicidal ideation and major  depression.  As per Dr. Daylene Katayama recommendation, the patient would  need to  be admitted to inpatient psych for the same.  The patient is at  present waiting for a bed in the behavioral health center.   RADIOLOGICAL INVESTIGATIONS DONE DURING THE STAY IN THE HOSPITAL:  1. CT of the head without contrast.  Impression:  Mild comminuted anterior nasal bone fracture, triangular-  shaped fracture segment or foreign body lateral to the posterior aspect  of the nasal bone on the right.  There are several tiny fracture  segments or foreign bodies in that region, mild atrophy.  No skull  fracture or intracranial hemorrhage.  1. CT of the cervical spine.  Impression:  No fracture or subluxation, multiple level degenerative  changes, Klippel-Feil deformity at C5-C7 levels, 2-cm left lobe thyroid  mass, dense bilateral carotid artery atheromatous calcifications.  1. A 2-D echo, done on June 14, 2009.  Impression:  Left ventricular cavity size normal, wall thickness  increased, mild LVH, systolic function lower  limits of normal, EF 50-  55%.  1. Carotid Dopplers, done on June 14, 2009.  Impression:  No ICA stenosis with good vertebral flow.   DISPOSITION:  The patient at present is medically stable to be  discharged to the psych facility.  The patient is awaiting behavioral  health place skilled facility placement and would await the same.   TOTAL TIME FOR DISCHARGE:  40 minutes.      Jimmey Ralph, MD  Electronically Signed     MP/MEDQ  D:  06/15/2009  T:  06/16/2009  Job:  [id number]

## 2011-05-02 NOTE — Consult Note (Signed)
NAMEAARIN, LANGFITT NO.:  [id number]   MEDICAL RECORD NO.:  [id number]          PATIENT TYPE:  INP   LOCATION:  [id number]                         FACILITY:  [facility]   PHYSICIAN:  Felizardo Hoffmann, M.D.  DATE OF BIRTH:  08-Dec-1935   DATE OF CONSULTATION:  10/20/2008  DATE OF DISCHARGE:                                 CONSULTATION   [name] is feeling much better today.  He is displaying hope and  discussing hope.  He has no thoughts of harming himself or others.  He  has no hallucinations or delusions.  He explains that when he took the  overdose, he was under the influence of alcohol which impaired his  judgment and increased his impulsivity.  He also was experiencing severe  shame yesterday after being back in the hospital with a relapse.   Since that time, he has experienced much support from hospital staff as  well as the Education officer, museum.  He and his social worker have reinforced a  plan that has been discussed with the patient recently at [employer].  The plan involves the patient attending a dual diagnosis  extended residential program in [place].   Contacts are underway with the social worker involved to help facilitate  his travel down there.   He is socially appropriate and cooperative.  He has intact interests and  constructive goals.   MENTAL STATUS EXAM:  Mr. Strosnider is alert.  He is oriented to all spheres.  Memory is intact to immediate recent and remote.  Affect is broad and  appropriate.  Mood within normal limits.  Thought process logical,  coherent, goal-directed.  No looseness of associations.  Thought content  - no thoughts of harming himself, no thoughts of harming others, no  delusions, no hallucinations.  Insight is good.  Judgment is intact.   ASSESSMENT:  AXIS I:  1. 293.83 - mood disorder not otherwise specified (substance factors)      now stable.  2. 293.84 - anxiety disorder not otherwise specified, improved.  3. Alcohol  dependence.   Mr. Tanton is no longer at risk to harm himself after recovering from his  alcohol intoxication, as well as his acute shame and receiving support  and rest.  He does agree to call local 9-1-1 immediately for any  thoughts of harming himself or others or severe distress.   He understands that he can make the 9-1-1 call during his trip, as well,  if he should need so.   RECOMMENDATIONS:  1. Because of the patient's propensity to relapse in recent weeks, the      undersigned did recommend that he first attend a local inpatient      program and then proceed to a long-term residential inpatient      chemical dependence program.  However, the patient declined, and he      is no longer committable.  The patient's plan is to proceed as      above and is working with the Education officer, museum to facilitate his      travel to the program  in [place].  2. The Seroquel can be discontinued for p.r.n. insomnia.  If the      patient requires, would utilize hydroxyzine 25-50      mg q.h.s. p.r.n.  No other psychotropics indicated.  However, would      continue thiamine 100 mg daily indefinitely.  3. Twelve step method and groups.      Felizardo Hoffmann, M.D.  Electronically Signed     JW/MEDQ  D:  10/21/2008  T:  10/21/2008  Job:  [id number]

## 2011-05-02 NOTE — H&P (Signed)
[name], [name] NO.:  [id number]   MEDICAL RECORD NO.:  [id number]          PATIENT TYPE:  IPS   LOCATION:  0304                          [facility]   PHYSICIAN:  Stark Jock, M.D. DATE OF BIRTH:  May 13, 1935   DATE OF ADMISSION:  09/28/2008  DATE OF DISCHARGE:                       PSYCHIATRIC ADMISSION ASSESSMENT   This is 76 year old male that was voluntarily admitted on September 28, 2008.  The patient reports that he has been drinking, has been drinking  for the past three days.  He states that suicidal thoughts came again.  He feels that all he is doing is just existing. He does not even know if  he wants help anymore with his depression.  He denies any  hallucinations.  He states his family has been disappointing him.  He  has not been sleeping well and does not feel his medications are  working.   PAST PSYCHIATRIC HISTORY:  The patient was here before.  He has a long  history of depression.  He states that his followup did not work for  him.  The shelter that he was supposed to go to was unaware that he was  to be residing there, which caused him more frustration.   SOCIAL HISTORY:  A 76 year old male.  He states he has a poor support  system.  Considers himself homeless at this time.   FAMILY HISTORY:  None.   ALCOHOL AND DRUG HISTORY:  Again, he has been drinking recently,  drinking for the past 3 days.  Denies any drug use.   PRIMARY CARE [place]:  None.   MEDICAL PROBLEMS:  The patient reports a history of a pinched nerve in  his back.   MEDICATIONS:  Has been on Lithium before, which he states was  ineffective and has had no medications prior to this admission.   DRUG ALLERGIES:  No known allergies.   MENTAL STATUS EXAM:  This is a fully alert, cooperative male, casually  dressed, fair eye contact.  Speech is clear, normal pace and tone.  The  patient's mood is worthless and hopeless.  The patient's affect is sad.  Thought  processes are coherent.  Endorsing suicidal thoughts, but  promises safety.  Cognitive function is intact.  His memory appears to  be good.  Judgment and insight are fair.   DIAGNOSES:  Axis I:  Major depressive disorder, alcohol abuse.  Axis II:  Deferred.  Axis III:  Back problems.  Axis IV:  Problems with housing, poor support system.  Axis V:  Current 35 to 40.   PLAN:  Contract for safety.  Stabilize mood thinking.  We will have  Librium available on a p.r.n. basis.  Case manager will assess his  living situation.  We will continue to assess co-morbidities.  Will have  Seroquel as well available on a p.r.n. basis for agitation and anxiety.  The patient will be in the red group.  Tentative length of stay at this  time is 4 to 6 days.      Redgie Grayer, N.P.  Stark Jock, M.D.  Electronically Signed    JO/MEDQ  D:  09/30/2008  T:  09/30/2008  Job:  [id number]

## 2011-05-05 NOTE — Cardiovascular Report (Signed)
NAME:  [name], [name]                           ACCOUNT NO.:  [id number]   MEDICAL RECORD NO.:  [id number]                   PATIENT TYPE:  INP   LOCATION:  6525                                 FACILITY:  [facility]   PHYSICIAN:  Allegra Lai. Terrence Dupont, M.D.              DATE OF BIRTH:  08/14/1935   DATE OF PROCEDURE:  02/09/2004  DATE OF DISCHARGE:                              CARDIAC CATHETERIZATION   PROCEDURES PERFORMED:  1. Successful percutaneous transluminal coronary angioplasty to the distal     right coronary artery using 2.0 x 9 millimeter long Maverick and then a     3.0 x 9 millimeter long Maverick balloon.  2. Successful deployment of a 3.5 x 12 millimeter long TAXUS drug-eluting     stent in the distal right coronary artery.   CARDIOLOGISTAllegra Lai Terrence Dupont, M.D.   INDICATIONS FOR THE PROCEDURE:  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  EKG done in the ER  showed   Dictation ended at this point.                                               Allegra Lai. Terrence Dupont, M.D.    MNH/MEDQ  D:  02/09/2004  T:  02/11/2004  Job:  [id number]

## 2011-05-05 NOTE — Discharge Summary (Signed)
[name], [name] NO.:  [id number]   MEDICAL RECORD NO.:  [id number]          PATIENT TYPE:  IPS   LOCATION:  0306                          [facility]   PHYSICIAN:  Stark Jock, M.D. DATE OF BIRTH:  [birth year]   DATE OF ADMISSION:  07/21/2008  DATE OF DISCHARGE:  08/12/2008                               DISCHARGE SUMMARY   IDENTIFICATION:  This is a 76 year old African American male who was  admitted on a voluntary basis on July 21, 2008.   HISTORY OF PRESENT ILLNESS:  The patient states I struggled with  depression all my life.  He had been clean from alcohol and substances  for 15 years.  Then he states he took a drink in New Year's Eve and used  marijuana.  He states since then he began intermittent use again.  Now  he is drinking 3-4 beers daily.  He has been making plans to hang  himself.   PAST PSYCHIATRIC HISTORY:  The patient sees Dr. Maudie Mercury, his psychiatrist.  He has suffered depression all his life.  He stayed off alcohol for 18  years.  He has been on Seroquel, trazodone, and Klonopin in the past.   ALCOHOL AND DRUG HISTORY:  Alcohol as indicated above.   MEDICAL PROBLEMS:  The patient denies any medical problems.  He does  have a history of a cardiac stent placement.   MEDICATIONS:  None.   DRUG ALLERGIES:  No known drug allergies.   PHYSICAL FINDINGS:  Physical exam was done in the ED.  There were no  acute physical or medical problems noted.   HOSPITAL COURSE:  Upon admission, the patient was placed on Librium  detox protocol.  He was also started on trazodone 50 mg p.o. q.h.s.  p.r.n., may repeat in 1 hour if needed and Librium 25 mg p.o. q.6 hours  p.r.n. symptoms of withdrawal.  He was also placed on Seroquel 100 mg  p.o. q.h.s. as standing order and trazodone 50 mg p.o. q.h.s. p.r.n.  insomnia.  In individual sessions with me, the patient was friendly and  cooperative with good eye contact initially.  Later, he developed an  irritable mood and he first did not want to attend unit therapeutic  groups and activities.  This improved as his hospitalization progressed.  He discussed his relapse with me.  He then began thinking of suicide,  hanging himself.  He is drinking beer daily.  He has a lot of financial  stressors and he is homeless.  The patient was irritable.  The patient's  mood became more irritable as hospitalization progressed and he  continued to talk about positive suicidal ideation that is constant.  He would become easily irritated at other pears who bothered him.  However, he was able to realize that he was looking for reasons to be  mad at other people.  The patient did not want his family involved.  He  said they are not dependable.  He has been feeling worse for the past 4  to 6 weeks.  He was depressed and anxious.  P.r.n. Seroquel as written  before was discontinued, instead Seroquel 50 mg p.o. q.4 hours p.r.n.  anxiety was started.  This was on July 31, 2008.  This was increased  Seroquel 100 mg p.o. q.4 hours p.r.n. anxiety.  Sleep was frequently  disrupted by middle of the night awakening.  On August 01, 2008, mood  continued to be depressed and anxious with positive suicidal ideation.  He was able to contract for safety here.  He felt people were  deliberately trying to agitate him.  On the unit, he stated he had mood  swings and calls by people irritating me.  The patient's mood  continued to be irritable, depressed, and anxious as hospitalization  progressed.  He became upset when he was given a patient who he thought  he has the flu.  He was afraid he was going to get the flue.  He was  able to be calm down by staff.  He discussed his alcohol abuse and how  it has impacted on his increasing suicidal ideation.  The patient's  trazodone was increased to 100 mg p.o. q.h.s. with may repeat x1 p.r.n.  insomnia.  On August 05, 2008, he was somewhat less depressed, less  anxious.  He stated  he was scared that he could worsen, but felt better  and had no suicidal ideation.  He was looking at [facility] in  [place].  He lived there before and likes it.  He does not want to  stay in [place].  He states he is still craving alcohol.  On August 07, 2008, his mood was good.  He still had no suicidal ideation.  He  discussed his irritation at another peer, but had the inside to  recognize he is projecting his own feelings onto others.  Due to  difficulty sleeping, the patient's trazodone was increased to 150 mg  p.o. q.h.s. with may repeat x1 p.r.n. insomnia.  On August 10, 2008, by  August 11, 2008, the patient was feeling really up.  His sleep was  good, appetite was good.  Mood was euthymic.  No suicidal ideation.  He  felt ready for discharge the next day.  On August 12, 2008, the  patient's mental status continued to be much improved from admission  status.  His mood was euthymic.  Affect consistent with mood.  No  suicidal or homicidal ideation.  No thoughts of self-injurious behavior.  No auditory or visual hallucinations.  No paranoia or delusions.  Thoughts were logical and goal-directed.  Thought content.  No  predominant theme.  Cognitive was grossly intact.  Insight was good,  judgment good.  There was minimal impulsivity.  It was felt the patient  was ready for discharge today, was going to the [facility] in  [place].  He had been there before and liked this place and was  looking forward to it.   DISCHARGE DIAGNOSES:  Axis I:  Mood disorder, not otherwise specified.  Alcohol dependence.  Axis II:  None.  Axis III:  No diagnosis.  Axis IV:  Severe (problems with primary support group, occupational  problem, housing problem, economic problem, burden of psychiatric and  substance abuse illness).  Axis V:  Global assessment of functioning was 55 upon discharge.  GAF  was 42 upon admission.  GAF highest past year was 60-65.   DISCHARGE PLAN:   There was no specific activity level or dietary  restrictions.   POSTHOSPITAL CARE PLANS:  The patient will  see Kirt Boys at the  [facility] on Wednesday, August 05, 2008 at  1:15 p.m.   DISCHARGE MEDICATIONS:  1. Seroquel 100 mg p.o. q.h.s. and 100 mg as needed every 4 hours for      anxiety or agitation.  2. Trazodone 100 mg 1-1/2 pills at bedtime.      Stark Jock, M.D.  Electronically Signed     BHS/MEDQ  D:  08/16/2008  T:  08/17/2008  Job:  [id number]

## 2011-05-05 NOTE — H&P (Signed)
NAME:  [name], [name]                           ACCOUNT NO.:  [id number]   MEDICAL RECORD NO.:  [id number]                   PATIENT TYPE:  INP   LOCATION:  1830                                 FACILITY:  [facility]   PHYSICIAN:  Ashby Dawes. Polite, M.D.              DATE OF BIRTH:  Nov 30, 1935   DATE OF ADMISSION:  02/08/2004  DATE OF DISCHARGE:                                HISTORY & PHYSICAL   CHIEF COMPLAINT:  Chest pain.   HISTORY OF PRESENT ILLNESS:  [name] is a 76 year old male with known  history of prostate CA, depression, off meds x 4 weeks, and blindness in the  left eye post trauma, who presents to the ED for complaints of chest pain.  The patient arrives to the ED via paramedics after having substernal chest  pain at home, recurrent 3 to 4 times.  The patient describes the pain as  being pressure like in the substernal area, 7/10, with radiation to the left  arm and associated left arm numbness.  The patient also has associated  palpitations, shortness of breath and diaphoresis.  The patient's wife  called 911 because of the recurrent nature of his symptoms.  Of note, the  patient has had a visit to the ER one week ago at [facility] for  palpitations.  At that time the patient was discharged for further  outpatient follow-up.  Today the patient presents to the ED, as stated, via  paramedics with hypertension at 200/110, normal sinus rhythm.  EKG shows  sinus rhythm, possible LVH and strain pattern.  Point of care enzymes were  ordered and all within normal limits.  [facility] hospitalist has been called for  further evaluation of this patient.  At the time of my evaluation, the  patient is pain free without any discomfort at all.  The patient does admit  to having an evaluation approximately five years ago for chest pain where he  states that he had a stress test and per him his results were negative.  The  patient does admit to being off of his antidepressant meds for about  three  to four weeks.  He states that he weaned himself off of these meds, that he  did not want to be on these meds anymore.  As for the patient's symptoms of  chest pain, it is as stated above.  Prior to recent events, the patient does  not have a history of these kinds of symptoms in the past.  The patient does  have a family history of coronary artery disease.  His father died from a  heart attack at 20 and had heart problems prior to that.  Also with  hypertension.   PAST MEDICAL HISTORY:  1. As stated above, prostate CA diagnosed 3 years ago, treated with XRT.  2. Depression, off meds x 3 to 4 weeks.  3. History of panic attack.  4. History of negative stress test approximately 5 years ago at [facility]     office with Dr. Sharlett Iles.   MEDICATIONS:  None.  As stated, the patient has been off Effexor  approximately 3 to 4 weeks, which he stopped.   SOCIAL HISTORY:  Positive for tobacco, one pack per day x 19 years.  No  alcohol, no drugs.   PAST SURGICAL HISTORY:  Left hip surgery post fracture.  The patient denies  appendectomy or cholecystectomy.   ALLERGIES:  No known drug allergies.   FAMILY HISTORY:  The patient's mother is deceased without any known medical  problems.  The patient's father is deceased from myocardial infarction.  His  father also had prior heart problems with hypertension.  The patient has a  brother and sister who are healthy.   PHYSICAL EXAMINATION:  GENERAL:  The patient is alert and oriented x 3.  VITAL SIGNS:  Temp 99.0; BP 185/95; pulse 78; respiratory rate of 18; 98% on  room air.  HEENT:  The patient has had a traumatic injury to the left eye with  resultant blindness.  Right pupil reactive.  The patient has poor dentition.  No oral lesions.  No carotid bruit appreciated.  CHEST:  Occasional rhonchi.  CARDIOVASCULAR:  Regular S1, S2, no S3.  ABDOMEN:  Soft, nontender, no hepatosplenomegaly.  EXTREMITIES:  No clubbing, cyanosis or edema.    DATA:  BMET:  Sodium 139, potassium 3.3, chloride 97, BUN less than 3,  creatinine 1.0.  CBC:  Within normal limits.  Point of care enzymes:  Within  normal limits.  EKG:  Normal sinus rhythm, inverted T waves in V4, V5, V6.  This was compared with a prior EKG from February 04, 2004, without acute  change, consistent with LVH and strain pattern.  Chest x-ray is pending at  the time of this dictation.   ASSESSMENT:  1. Chest pain and the patient with risk factors, i.e., tobacco use,     hypertension and family history, with a negative stress test     approximately 5 years ago.  2. Palpitations.  3. Prostate cancer, diagnosed 3 years ago, treated with XRT with probable     poor follow-up since.  4. Depression, off medications x 3 to 4 weeks.  Of note, the patient has a     history of panic attacks.  However, he describes above symptoms as being     different.  5. Blind left eye post trauma in the distant past.  6. Hypertension.   I recommend that the patient be admitted to a telemetry floor bed, have  serial cardiac enzymes, have risk stratification, i.e., stress test.  We  will check baseline lipids and TSH.  We will also monitor the patient to  rule out arrhythmia/palpitations for cause of his above symptoms.  It has  been recommended for the patient to resume his antidepressant.  However, at  this time, the patient states that he prefers not to be on this medication.  I will make further recommendations after review of the above studies.                                                Ashby Dawes. Polite, M.D.    RDP/MEDQ  D:  02/09/2004  T:  02/09/2004  Job:  [id number]
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
## 2011-05-05 NOTE — Discharge Summary (Signed)
NAMELARMAR, STREICH NO.:  [id number]   MEDICAL RECORD NO.:  [id number]          PATIENT TYPE:  IPS   LOCATION:  0407                          FACILITY:  [facility]   PHYSICIAN:  Stark Jock, M.D. DATE OF BIRTH:  23-Oct-1935   DATE OF ADMISSION:  09/28/2008  DATE OF DISCHARGE:  10/13/2008                               DISCHARGE SUMMARY   IDENTIFICATION:  This is a 76 year old male who was admitted on a  voluntary basis on September 28, 2008.   HISTORY OF PRESENT ILLNESS:  The patient reports that he has been  drinking for the past 3 days.  He states that suicidal thoughts came  again he feels that all.  He is doing as just existing.  He does not  even know if he wants help anymore with his depression.  He denies any  hallucinations.  He states his family has been disappointing him.  He  has not been sleeping well and does not feel his medications are  working.   PAST PSYCHIATRIC HISTORY:  The patient was here before.  He has a long  history of depression.  He states that his followup did not work for  him.  The shelter that he was supposed to go to was unaware that he was  to be residing there, which caused him frustration.   FAMILY HISTORY:  None.   ALCOHOL AND DRUG HISTORY:  The patient has been drinking recently,  drinking for the past 3 days.  Denies any drug use.   MEDICAL PROBLEMS:  The patient reports a history of pinched nerve in my  back.   MEDICATIONS:  He has been on lithium before, which he states was  ineffective and has no medications prior to this admission.   DRUG ALLERGIES:  No known drug allergies.   PHYSICAL FINDINGS:  There were no acute physical or medical problems  noted.  His physical exam was done and at the [facility] ED.   ADMISSION LABORATORIES:  CBC was within normal limits.  UDS negative.  Urinalysis negative.  Alcohol level was 78.  Liver profile within normal  limits.  Lithium was less than 0.25.   HOSPITAL COURSE:  Upon  admission, the patient was started on trazodone  50 mg p.o. q.h.s. p.r.n. insomnia and Seroquel 100 mg p.o. q.4 h. p.r.n.  anxiety or agitation.  He was also started on Librium detox protocol.  In individual sessions, the patient was friendly and cooperative.  He  remembered me from his last admission here.  He states he got more and  more depressed and began to have suicidal ideation.  He feels alcohol is  not his problem now.  He is not on any medicines.  Mental health tried  him on lithium carbonate, but he has not been compliant with this.  He  was upset that his followup gotten mixed up and the mental health  center and the shelter were not expecting him.  This was in spite of the  fact that we did document having contacted both places prior to his  discharge  last time.  It was unclear how the mix up occurred.  He  continued to be very depressed, anxious, and hopeless with positive  suicidal ideation.  He discussed his family disappointing me.  He  states they would not get involved with him.  He is struggling whether  he wants a family session.  He states his suicidal ideation was  constant.  He did not see a reason to go on.  He did call his daughter  in the a.m. of October 01, 2008.  He felt the call was not very  encouraging.  He states both of his sons have abandoned him.  The  patient continued to feel hopeless with positive anhedonia and suicidal  ideation.  He could contract for safety in the hospital.  He continued  to talk about his daughter's having disappointed him.  He tried to call  his other daughter the day before, he states she was not supportive  either.  He worried about where he was going to go when he leaves here.  He was hoping his daughter would visit.  He was worried not getting my  hopes up.  He felt worse after his daughter did not come to visit no  call or nothing.  Sleep was good and appetite was good, but he still  remained anxious with positive suicidal  ideation.  He complained of  racing thoughts, thinking of ways to hurt himself.  Finally on October  18, w009, his 2 daughters visited him.  He was happy about this.  He is  still not sure they would be follow through.  He was a little less  depressed and less anxious.  He continued to focus on his poor  relationship with his other 3 kids.  Mood was improving somewhat that  there was still dysphoria.  On October 07, 2008, he was less depressed  and less anxious.  He was still having mood swings and would go from  feeling good to feeling down again.  He talked with his daughter  again, but was not optimistic that she would carry through on the palms  suggested that she may.  On October 08, 2008, he continued to be less  and less anxious.  He talked about the idea of assisted-living, which  was introduced to him by our Education officer, museum.  He states, however, he does  not like to live around a lot of people.  He may live in an apartment  with a friend.  Suicidal ideation had resolved.  On October 12, 2008, he  stated he has specific plans after discharge.  He was discharged on  October 27.  He had been noted on October 26 as being irritable and  aggressive towards other patients.  It was felt that he did not appear  to be a function of mental illness.  It was felt the patient would not  benefit from continued to stay here and he was discharged.  There was no  suicidal or homicidal ideation.  No thoughts of self-injurious behavior.  No auditory or visual hallucinations.  No paranoia or delusions.  Thoughts were logical and goal-directed.  Thought content.  No  predominant theme.  Cognitive grossly intact.   DISCHARGE DIAGNOSES:  Axis I:  Mood disorder, not otherwise specified,  alcohol abuse.  Axis II:  Features of personality disorder, not otherwise specified.  Axis III:  Back problems.  Axis IV:  Severe (problems with housing, poor support system, burden of  psychiatric  illness, burden of  medical problems, burden of back  problems).  Axis V: Global assessment of functioning was 50 upon discharge.  GAF  highest past year was 35 to 40 upon admission.  GAF highest past year  was 25 to 16.   DISCHARGE PLANS:  There was no specific activity levels or dietary  restrictions.   POSTHOSPITAL CARE PLANS:  The patient go to the [facility] on  October 29th at 3:30 p.m.   DISCHARGE MEDICATIONS:  Seroquel 300 mg p.o. q.h.s. and trazodone 50 mg  at bedtime if needed for insomnia.      Stark Jock, M.D.  Electronically Signed     BHS/MEDQ  D:  11/16/2008  T:  11/16/2008  Job:  [id number]

## 2011-05-05 NOTE — Discharge Summary (Signed)
[name], [name]                 ACCOUNT NO.:  [id number]   MEDICAL RECORD NO.:  [id number]          PATIENT TYPE:  INP   LOCATION:  5702                         FACILITY:  [facility]   PHYSICIAN:  [place] A. Walker Kehr, M.D.    DATE OF BIRTH:  [birth year]   DATE OF ADMISSION:  11/07/2005  DATE OF DISCHARGE:  11/10/2005                                 DISCHARGE SUMMARY   DISCHARGE DIAGNOSES:  1.  Right middle lobe pneumonia.  2.  Hypertension.  3.  Status post prostate cancer.  4.  Tobacco abuse.   HISTORY OF PRESENT ILLNESS:  The patient was admitted with a chief complaint  of right-sided chest pain.  In brief, he is a 76 year old African-American  male with a history of coronary artery disease status post stent placement  February 2005, hyperlipidemia, depression, prostate cancer, and tobacco  abuse.  The patient explains that his pain is right-sided, sharp, aching,  constant.  It began the day before admission.  The patient also reported  shortness of breath that began the day before admission as well.  In  addition to these symptoms, about four to five days prior to admission the  patient also reports rhinorrhea, an episode of diarrhea, and a dry,  nonproductive cough.  The patient tried over-the-counter cold medicine with  Tylenol, which has not relieved his symptoms.  On the day of admission he  did vomit four times with a brown, mucous vomitus.  Notably no history of  angina or shortness of breath with walking.  In the emergency room a chest x-  ray showed right middle lobe pneumonia with dense consolidation.  He was  started on an aspirin, given albuterol nebulizer, ipratropium bromide, and  antibiotic therapy consisting of ceftriaxone and Zithromax.   ALLERGIES:  No known drug allergies.   BRIEF HOSPITAL COURSE:  Problem 1.  RIGHT MIDDLE LOBE PNEUMONIA:  Diagnosed by x-ray.  The patient  was initially treated with Zithromax and ceftriaxone.  Pulse oximetry was  monitored.  The  patient had an unremarkable hospital course and his  antibiotic therapy was changed over to Avelox 400 mg daily.  This change was  done the day before discharge and on the day of discharge, the patient had a  repeat chest x-ray, which showed improvement in his pneumonia.  The patient  was stable and afebrile without an oxygen requirement, ambulating freely  without constitutional complaint on the day of discharge.  The patient was  afebrile for at least 24 hours before discharge.  CT done subsequent to the  chest x-ray showed no PE but a consolidated lateral segment of the right  middle lobe with right hilar lymphadenopathy and contiguous right hilar  nodes measuring 2 x 3 cm in largest diameter.  Also noted was a 2.5 x 1.5 cm  subcarinal node.  Pulmonology was consulted and determination was made to re-  CT the patient one month after discharge once his diagnosis of pneumonia was  complete.  The patient was discharged with follow-up to pulmonology with Dr.  Lamonte Sakai at [facility], telephone number [phone].  Problem 2.  HYPERTENSION:  The patient was noted to have inconsistent  systolic hypertension during his stay with blood pressures that ranged from  91/47 to 146/62.  The patient also has a history of coronary artery disease.  In discussing with the patient, it was made clear that the patient did not  want to take prescription medications.  He had been prescribed  antihypertensive medications in the past but will not take them.  It was  explained to the patient, given his history of coronary artery disease with  surgery and his history of hyperlipidemia with hypertension, that medical  control of these diseases would prolong his life and decrease the  possibility of further coronary disease.  Despite our insistence that the  patient should go on standard care of medical management for his cardiac  disease, the patient refused to accept any prescriptions.   Problem 3.  TOBACCO  ABUSE:  The patient was maintained on a nicotine patch  during his stay and, interestingly, did agree to take the nicotine patch as  an outpatient.   Problem 4.  INFECTIOUS DISEASE:  The patient was stable and afebrile at  discharge.  He was given a prescription for Avelox 400 mg daily to be  continued for seven days.  The patient was will follow up with both [facility] and [facility].   Problem 5.  PROSTATE CANCER:  PSA was within normal limits.  Negative  postvoid residuals.  The patient was stable with regard to this problem.   CONSULTANTS:  Collene Gobble, M.D., [facility].   RADIOGRAPHIC STUDIES:  November 07, 2005, chest x-ray.  Impression:  Right  middle lobar pneumonia with extensive consolidation.  November 21, CT  angiogram of the chest.  Impression:  Right hilar lymph node mass wit  peripheral consolidation.  The hilar nodal mass as well as additional  mediastinal lymphadenopathy is greater than would be expected for pneumonia.  This is worrisome for cancer with distal obstruction.  Secondary ground-  glass consolidation at right lung base may represent the site of infection,  although metastatic disease is considered.  Extensive mediastinal  lymphadenopathy as described previously.  Atherosclerosis.  Large bilateral  adrenal glands.  Metastatic disease is not excluded.  Cystic lesions in the  left kidney are incompletely evaluated.  Granulomatous changes of the  spleen.   DISCHARGE LABORATORY DATA:  CBC:  WBC 16.4, hemoglobin 10.3, hematocrit  29.3, platelet count 268.  CMET:  Sodium 134, potassium 4.2, chloride 99,  CO2 27, glucose 181, BUN 5, creatinine 0.0, total bilirubin 0.8, alkaline  phosphatase 123, AST 33, ALT 38, total protein 5.2, albumin 1.6, calcium  8.0.  Sputum AFB negative.  Blood cultures x2, aerobic and anaerobic,  negative.  HIV antibody nonreactive.  Hepatitis C antibody negative. Hepatitis B surface antigen  negative.  PSA 0.04, normal.  Cardiac enzymes  negative.   DISCHARGE MEDICATIONS:  1.  Aspirin 325 mg daily.  2.  Avelox 400 mg daily x7 days.  3.  Nicoderm CQ 21/24 hour patch, change patch daily.   DISCHARGE FOLLOW-UP:  1.  Return to Dr. Lamonte Sakai at [facility], telephone number [phone],      within one month after CT.  Dr. Lamonte Sakai will be arranging for follow-up      and will contact the patient directly.  Kim, telephone number [phone].  The patient has      been instructed to call for appointment.  The patient is discharged on a      weekend, and every effort will be made to contact the patient directly      from the clinic on Monday to make a follow-up appointment.   DISCHARGE INSTRUCTIONS:  1.  Follow-up:  The patient needs a CT of the chest with contrast in one      month.  The patient will be called for a day and time with [facility]      Pulmonology.  2.  The patient is to also follow up with [facility] for      continued evaluation of his coronary      artery disease, hypertension, hyperlipidemia, smoking cessation and      general medical care.  Every effort will be made at that time to      continue to impress upon the patient the need for medical management of      blood pressure and lipid control for his overall well-being.      Sarita Bottom, M.D.    ______________________________  Arty Baumgartner. Walker Kehr, M.D.    JP/MEDQ  D:  11/10/2005  T:  11/11/2005  Job:  [id number]

## 2011-05-05 NOTE — Discharge Summary (Signed)
[name], NOLAND NO.:  [id number]   MEDICAL RECORD NO.:  [id number]          PATIENT TYPE:  IPS   LOCATION:  0406                          [facility]   PHYSICIAN:  Stark Jock, M.D. DATE OF BIRTH:  1935-10-06   DATE OF ADMISSION:  06/16/2009  DATE OF DISCHARGE:  06/28/2009                               DISCHARGE SUMMARY   IDENTIFICATION:  This is a 76 year old divorced African American male  who was admitted on a voluntary basis on June 16, 2009.   HISTORY OF PRESENT ILLNESS:  The patient is a transfer from the medical  unit after he was admitted for a syncopal episode.  The patient was  medically cleared.  He does have a history of multiple medical problems.  He states that he is drinking as much as I can get.  He is having some  depressive symptoms, passive suicidal thoughts stating I am done,  I am  finished.  He states he has nothing to live for and is feeling  hopeless.  He has no support and no future goals.  This patient has been  here several times.  He was last here in March 2010.  He was discharged  to an assisted living facility that he was unhappy with.   PHYSICAL FINDINGS:  This is an elderly male fully assessed at [facility].  He did present today with some operations to his faces where  he stated he failed.  Otherwise, he appears well-nourished and in no  acute distress.   ADMISSION LABORATORIES:  Urine drug screen was positive for marijuana.  CBC was within normal limits.  Alcohol level was 115.  CMET shows a  glucose of 102 with alkaline phosphatase of 131.   HOSPITAL COURSE:  Upon admission, the patient was restarted on his home  medications of Coreg 6.25 mg p.o. b.i.d., Librium 25 mg p.o. q.6 h.  p.r.n. symptoms of withdrawal, Zyprexa 5 mg p.o. q.6 h. p.r.n.  agitation, aspirin 81 mg daily, multivitamin daily, Protonix 40 mg  daily, Thiamine 100 mg daily, Vistaril 25 mg t.i.d. p.r.n., Celexa 10 mg  daily, and  hydralazine 25 mg p.o. t.i.d.  In individual sessions, the  patient was depressed with suicidal ideation.  He states that he is  tired of doing the same thing over and over and again he continues to  minimize his substance abuse issues.  He does agree to attend the [facility], which is the chemical dependence group.  On June 21, 2009, he  continued to be depressed and anxious.  Affect was consistent with mood.  He continued to be suicidal.  His Celexa was increased to 20 mg p.o.  daily.  He was having difficulty sleeping and trazodone 50 mg p.o.  q.h.s. p.r.n. insomnia was started, may repeat x1.  His hospitalization  progressed, the patient continued to be depressed, anxious, and angry.  He was still having trouble sleeping.  On June 23, 2009, trazodone was  increased to 100 mg p.o. q.h.s. p.r.n. insomnia.  On June 25, 2009, he  stated  that he felt a lot better anger is gone.  He still felt  somewhat depressed, but overall was having no auditory or visual  hallucinations.  On June 28, 2009, the patient stated he was okay.  He  denies suicidal or homicidal ideation or auditory or visual  hallucinations.  His sleep was good and appetite was improving. The  patient states he is going to give the [facility] to try.  He had been  up and very active in the milieu.  Mood was improved.  There was no  thought disorder.  No psychosis noted.  The patient was felt to be safe  for discharge today and was willing to try an [facility] for followup.   DISCHARGE DIAGNOSES:  Axis I:  Major depressive disorder, recurrent,  severe, and polysubstance abuse.  Axis II:  None.  Axis III:  Coronary artery disease and hypertension.  Axis IV:  Severe (problems related to housing, medical problems, and  psychosocial problems (severe)).  Axis V:  Global assessment of functioning was 50 upon discharge.  Global  assessment of functioning was 30 upon admission.  Global assessment of  functioning highest past year  was 51.   DISCHARGE PLAN:  There was no specific activity level or dietary  restrictions.  The patient will go to the [facility] on June 29, 2009, at 1:30 p.m.   DISCHARGE MEDICATIONS:  1. Celexa 20 mg daily.  2. Coreg 6.25 mg twice daily.  3. Aspirin 81 mg daily.  4. Protonix 40 mg daily.  5. Apresoline 25 mg t.i.d.  6. Thiamine 100 mg daily.  7. Multivitamins daily.      Stark Jock, M.D.  Electronically Signed     BHS/MEDQ  D:  07/07/2009  T:  07/08/2009  Job:  [id number]

## 2011-05-05 NOTE — Discharge Summary (Signed)
NAMETREW, DESORBO NO.:  [id number]   MEDICAL RECORD NO.:  [id number]          PATIENT TYPE:  IPS   LOCATION:  0302                          FACILITY:  [facility]   PHYSICIAN:  Norm Salt, MD  DATE OF BIRTH:  1935-11-02   DATE OF ADMISSION:  11/22/2006  DATE OF DISCHARGE:  12/03/2006                               DISCHARGE SUMMARY   IDENTIFYING DATA AND REASON FOR ADMISSION:  This was an inpatient  psychiatric admission for [name], a 76 year old separated African  American male.  He came to [place] with a history of depression, and suicidal  ideation without specific plan.  He had been under the care of Dr. Toy Care,  and had been on a regimen of Klonopin, Seroquel, and trazodone but had  been taking his medications only when he felt like it, and we were not  at all clear on how often that was.  Please refer to the admission note  for further details pertaining to the symptoms, circumstances and  history that led to his hospitalization.  He was given initial Axis I  diagnoses of major depressive disorder, recurrent.   MEDICAL AND LABORATORY:  The patient was medically and physically  assessed by the psychiatric nurse practitioner.  He came to [place] with a  history of cardiac stent placement 1 year prior to admission.  He was  not taking any non psychotropic medications at the time of admission.  There were no acute medical issues during his inpatient psychiatric  stay.   HOSPITAL COURSE:  The patient was admitted to the adult inpatient  psychiatric service.  He presented as a well-nourished, well-developed  [place] American male of at least average intelligence.  He was noted to  be quite irritable and had many complaints during the first portion of  his inpatient stay, during which he was cared for by Dr. Adele Schilder.  The  undersigned assumed care of the patient on the fifth hospital day.  At  that time, the patient was placed on a regimen of Seroquel.  In my first  interview with him on the fifth day, he was pleasant, but complained of  depression.  He seemed to know the names of his medications.  He was  fully oriented and quite articulate.  He stated that his mood was  worse, however, due to complications with the staff.  He complained  of  incompetence on their part and he stated that although he had  initially been overmedicated, he did not feel he was so at that time.  He reported that auditory hallucinations were coming and going.  He  stated Seroquel as doing fine.  We discussed his living situation  which he described as homeless.  We discussed the fact that the case  manager was trying to help him with this.  He was continued on his  regimen of Seroquel, Klonopin, and trazodone at bedtime.   The patient continued irritable, with many complaints about treatment  during much of his inpatient stay, although he gradually appeared to  become more relaxed, affable, and participated more appropriately  as  time went on.   We discussed aftercare alternatives, including one known as [facility].  The patient was open to this.   Sleep gradually stabilized with Seroquel 400 at bedtime.  To address  daytime irritability, low doses of Seroquel t.i.d. were added, which the  patient reported as very helpful.  His energy level improved as did his  appetite.   Later in his stay, it appeared that it might be useful to discontinue  his Klonopin, due to its habit-forming potential, and replace it with  somewhat larger doses of Seroquel, which was increased to 75 mg t.i.d.  and 400 mg q.h.s..  The patient felt positive about this change.   On the 12th hospital day, the patient was discharged.  He appeared to be  stable and doing well on medication.  He agreed to the aftercare plan we  developed for him.   AFTERCARE:  The patient was to follow up with Dr. Toy Care on 01/11/2007.   DISCHARGE MEDICATIONS:  Seroquel 75 mg 3 times daily and 400 mg at   bedtime, and trazodone 75 mg at bedtime.   DISCHARGE DIAGNOSES:  AXIS I: Schizoaffective disorder, most recently  depressed with psychotic features.  AXIS II: Deferred.  AXIS III: Status post cardiac stent.  AXIS IV: Stressors severe.  AXIS V: Global Assessment Function on discharge 65.      Norm Salt, MD  Electronically Signed     SPB/MEDQ  D:  12/27/2006  T:  12/29/2006  Job:  [id number]
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
## 2011-05-05 NOTE — Cardiovascular Report (Signed)
NAME:  [name], [name]                           ACCOUNT NO.:  [id number]   MEDICAL RECORD NO.:  [id number]                   PATIENT TYPE:  INP   LOCATION:  6525                                 FACILITY:  [facility]   PHYSICIAN:  Birdie Riddle, M.D.               DATE OF BIRTH:  [birth year]   DATE OF PROCEDURE:  02/09/2004  DATE OF DISCHARGE:                              CARDIAC CATHETERIZATION   REFERRING PHYSICIAN:  Benito Mccreedy, M.D. / _____________.   PROCEDURES PERFORMED:  1. Left heart catheterization.  2. Selective coronary angiography.  3. Left ventricular ______________.   CARDIOLOGIST:  Birdie Riddle, M.D.   INDICATIONS:  This 76 year old black male had atypical chest pain with a  sweating spell.  He had abnormal reaction to a stress test done a long time  ago, and, so he agreed to cardiac catheterization.   APPROACH:  Right femoral artery using 5 French sheath and catheter.  At the  end of the procedure the 5 French sheath was changed to a 7 [place] sheath  for angioplasty.   COMPLICATIONS:  None.   HEMODYNAMIC DATA:  The aortic pressure was 190/90.  Left ventricular  pressure was 190/15.   ANGIOGRAPHIC DATA:  Coronary Anatomy  Left Main Coronary Artery:  The left main coronary artery had mild proximal  disease of 10-20%.   Left Anterior Descending Coronary Artery:  The left anterior descending  coronary artery had mild proximal 10-20% lesions throughout the proximal  area and had minimal midvessel disease.  Its diagonal-1 and 2 vessels also  had mild diffuse disease proximally.   Left Circumflex Coronary Artery:  The left circumflex coronary artery had  mild proximal disease and had  small obtuse marginal vessels.   Right Coronary Artery:  The right coronary artery had mild diffuse disease  with a proximal posterolateral 80% lesion with good distal flow.  It was a  dominant vessel.   VENTRICULOGRAPHIC DATA:  Left Ventriculogram:  The left ventriculogram  showed mild inferior wall hypokinesia with an ejection fraction of 50-55%.   IMPRESSION:  1. Mild multivessel coronary artery disease.  2. Severe right coronary artery disease in the posterolateral branch.  3. Mild left ventricular dysfunction.   RECOMMENDATIONS:  This patient will undergo PTCA and stent placement in the  posterolateral branch of the right coronary artery by Dr. Terrence Dupont.  The  patient was agreeable to the procedure.                                               Birdie Riddle, M.D.    ASK/MEDQ  D:  02/09/2004  T:  02/11/2004  Job:  [id number]

## 2011-05-05 NOTE — Cardiovascular Report (Signed)
NAME:  [name], [name]                           ACCOUNT NO.:  [id number]   MEDICAL RECORD NO.:  [id number]                   PATIENT TYPE:  INP   LOCATION:  6525                                 FACILITY:  [facility]   PHYSICIAN:  Allegra Lai. Terrence Dupont, M.D.              DATE OF BIRTH:  1935-05-30   DATE OF PROCEDURE:  02/09/2004  DATE OF DISCHARGE:                              CARDIAC CATHETERIZATION   PROCEDURES PERFORMED:  1. Successful percutaneous transluminal coronary angioplasty to the distal     right coronary artery using 2.0 x 9 millimeter long Maverick balloon.  2. Successful percutaneous transluminal coronary angioplasty to the distal     right coronary artery using 3.0 x 9 millimeter Maverick balloon.  3. Successful deployment of a 3.5 x 12 mm long TAXUS drug-eluting stent in     the distal right coronary artery.   CARDIOLOGISTAllegra Lai Terrence Dupont, M.D.   INDICATIONS FOR THE PROCEDURE:  [name] is a 76 year old black male with a  past medical history significant for hypertension, morbid obesity,  depression, history of panic attacks, CA of prostate, and palpitations who  was admitted on February 08, 2004 because of retrosternal chest pain  described as a dull pain lasting for a few minutes associated with  diaphoresis.  EKG done in the ER showed normal sinus rhythm with LVH and  minor T-wave inversions in the lateral leads.  Due to multiple risk factors  and atypical chest pain the patient underwent cardiac catheterization by Dr.  Doylene Canard today, which showed the LV had mild inferior wall hypokinesia with  an EF of 50-55%.  The left main had mild proximal disease.  The LAD also had  mild 10-20% proximal stenosis.  The diagonal-1 and 2 had mild disease.  The  left circumflex mild proximal disease.  The RCA had mild diffuse disease in  the proximal and mid portion.  The vessel is very tortuous in the proximal  and mid portion; and, distally after the PDA the recurrent had 80-85%  focal  lesion with TIMI Grade III distal flow.   DESCRIPTION OF INTERVENTIONAL PROCEDURE:  After obtaining the informed  consent a 5 French arterial sheath was exchanged to a 7 [place] arterial  sheath.  The sheath was aspirated and flushed.  Next, a 7 [place] JR-4  guiding catheter with side holes was advanced over the wire under  fluoroscopic guidance up to the ascending aorta.  The wire was pulled out,  the catheter was aspirated and connected to the manifold.  The catheter was  further advanced and engage into the right coronary ostium.  Multiple views  of the right system were obtained.   Next, successful PTCA to the distal RCA was done using, initially a 2.0 x 9  mm long Maverick balloon and then a 3.0 x 9 mm long Maverick balloon for  predilatation using the buddy wire technique.  Then  a 3.5 x 12 mm long TAXUS  drug-eluting stent was deployed at 10 atmospheric pressure, which was fully  expanded, going up to 17 atmospheric pressure.  The lesion was dilated from  80-85% to 0% residual with excellent TIMI Grade III distal flow, and without  evidence of dissection or distal embolization.   The patient received weight-adjusted heparin, Integrilin and 300 mg of  Plavix during the procedure.   The patient tolerated the procedure well.   COMPLICATIONS:  There were no complications.   EQUIPMENT:  The guide catheter used was a JL-4 with side holes.  Wires used  were Choice PT-0.014 light support and then buddy wire was used, 0.014, for  better support and tracking of the stent and balloon.   The patient tolerated the procedure well and there were no complications.  The patient was transferred to the recovery room in stable condition.                                               Allegra Lai. Terrence Dupont, M.D.    MNH/MEDQ  D:  02/09/2004  T:  02/11/2004  Job:  [id number]   cc:   Catheterization Laboratory   Birdie Riddle, M.D.  [address]  [place] [address]   Benito Mccreedy, M.D.

## 2011-05-05 NOTE — Discharge Summary (Signed)
NAME:  [name], [name]                           ACCOUNT NO.:  [id number]   MEDICAL RECORD NO.:  [id number]                   PATIENT TYPE:  INP   LOCATION:  6525                                 FACILITY:  [facility]   PHYSICIAN:  Jon Gills, M.D. [facility]         DATE OF BIRTH:  1935-07-20   DATE OF ADMISSION:  02/08/2004  DATE OF DISCHARGE:  02/11/2004                                 DISCHARGE SUMMARY   DISCHARGE DIAGNOSES:  1. Chest pain with catheterization showing disease in the RCA which was     angioplasty by Dr. Doylene Canard.  2. Newly diagnosed hypertension.  3. Hyperlipidemia.  4. History of depression.  The patient has been off medications for the past     3 or 4 weeks and had not wanted to be back on it during his hospital     stay.  5. History of prostate cancer which is being followed by a urologist, here     in town, he could not give me the name.  6. Mild hypokalemia which has been replaced and is normalized at the time of     discharge.  7. Apparent history of panic attacks.  8. Reflux by symptoms.  9. Tobacco abuse; however, the patient says that he is going to be quitting,     and wishes to try it cold [place].   DISCHARGE MEDICATIONS:  1. Altace 2.5 mg p.o. daily.  2. Norvasc 5 mg p.o. daily.  3. Plavix 75 mg p.o. daily.  He most likely will not need the Plavix since     he did not have an acute MI and no stent was done, but we will leave this     decision to his new medical doctor who is supposed to see him.  4. Protonix 40 mg p.o. daily.  5. Ecotrin 325 mg p.o. daily.  6. Lopressor 50 mg p.o. b.i.d.  7. Zocor 40 mg p.o. q.h.s.  8. Nitroglycerin 0.5 mg sublingual 1 sublingual every 5 minutes x3 p.r.n.     chest pain.   DISCHARGE ACTIVITY:  As tolerated.   DISCHARGE DIET:  Low salt, low cholesterol fatty foods.   FOLLOW UP:  1. With [facility].  The patient was given the     number for the office, [phone] to set up an appointment in the  next 1-2     weeks to get a medical doctor whom he has not had.  I have advised him     that he should not wait very long and I would prefer if he was seen by     someone next week to make sure that his blood pressure is doing okay.  2. Keep his appointment with Dr. Toy Care who is his psychiatrist.  3. Keep his appointment with his urologist as previously said.   HOSPITAL COURSE:  This 76 year old black gentleman with no previous history  of hypertension  or coronary artery disease was admitted to [facility] with complaints of chest pressure and heaviness.  He was also  complaining of some associated palpitations, shortness of breath, and  diaphoresis.  Apparently he had gone to the ER a week prior to that with  history of palpitations; and, at that time, he was discharged for further  outpatient follow up.  On presentation to the ED the patient was noted by  paramedics to have a blood pressure of 200/110 with normal sinus rhythm.  His EKG showed normal sinus rhythm with possible  LVH with strain pattern.  The patient was subsequently admitted, ruled out for myocardial infarction  by cardiac enzymes.  His highest CPK was 483.  Troponin was never elevated.   Due to his risk factors with smoking and positive family history in the  father; Dr. Doylene Canard, who was the cardiologist, consulted and took him to  catheterization lab and found that he had disease in his RCA which was  subsequently angioplastied without any stent placement.  Dr. Doylene Canard does  make mention in this catheterization note that he has mild multivessel  coronary artery disease and severe right coronary disease in the  posterolateral branch with mild left ventricular dysfunction.  The right  coronary had proximal posterolateral 80% lesion with good distal flow.  On  the ventriculogram he was noted to have mild inferior hypokinesia with an  ejection fraction of 50-55%.  The circumflex showed mild proximal disease  and  had small obtuse marginal vessels.  The left anterior descending had  mild proximal 10-20% lesion throughout the proximal area and had minimal  midvessel disease. Its diagonal 1 and 2 vessels also had mild diffuse  disease proximally.  The left main had mild proximal disease of 10-20%.  The  patient has remained pain-free 1 day after his admission.  It was also felt  that he might have some reflux symptoms for which he was put on Protonix IV  and subsequently switched to p.o. and that is also under control.  The  patient was started on the nitroglycerin drip in the ER and subsequently  that has been discontinued.  He was also started on aspirin, Plavix,  metoprolol and Altace.   For his high blood pressure the same medications as was listed for his heart  was also continued.  In addition, he was also placed on Norvasc which would  also help with his mild left ventricular dysfunction.   Since all of these medicines were just started most of his blood pressures  are doing okay but there is some room for improvement. The morning of  discharge his blood pressure was noted to be about 179 or 180, but this was  prior to him getting his morning medications for today.  His diastolic blood  pressure is about 68; so it certainly looks like there might be room for  increasing his Norvasc or his Altace; and we will leave this to his new  primary care physician.  For this reason, I had recommended to the patient  that he be seen within the week.   He has just had some mild low potassium here.  He has been replaced.  With  him being on Altace I expect that the potassium will not be an issue  especially since he also has beta blockers on board.   The patient is pain free and ready to go.  He is going to try quitting  smoking on his own.  He says that he does not want any medication at this  point in time, but he thinks that he needs some he will talk to his primary care physician.  I did inform him  that with his beta blocker and his history  of depression there is a potential that his depression might act back up,  and he may need to be placed back onto his antidepressant, he was primarily  taking Effexor.  If his depression is severe enough, then consideration for  tapering him off of his metoprolol for a different choice of  antihypertensive.   The patient is stable at the time of discharge.  His last set of labs:  His  CBC done on February 23 showed an H&H of 12 and 34.6, normal white count.  His admitting H&H was 14 and 41.  Platelet count of 254,000 on the day of  discharge.  His BMP on the day of discharge showed a sodium of 140,  potassium of 3.9, CO2 of 32, glucose 90, BUN and creatinine 1 and 0.7,  calcium of 8.8.  Chest x-ray was fairly unremarkable at the time of  admission.   The patient is being discharged in stable condition.                                                Jon Gills, M.D. [facility]    RRV/MEDQ  D:  02/11/2004  T:  02/12/2004  Job:  [id number]   cc:   [facility]   Chucky May, M.D.  [address] [place] [place], [place] [address]  Fax: [phone]

## 2011-06-08 ENCOUNTER — Emergency Department: Payer: Self-pay | Admitting: Emergency Medicine

## 2011-07-11 ENCOUNTER — Ambulatory Visit: Payer: Self-pay | Admitting: Urology

## 2011-07-18 ENCOUNTER — Ambulatory Visit: Payer: Self-pay | Admitting: Urology

## 2011-09-04 ENCOUNTER — Inpatient Hospital Stay: Payer: Self-pay | Admitting: Vascular Surgery

## 2011-09-13 ENCOUNTER — Emergency Department: Payer: Self-pay | Admitting: Emergency Medicine

## 2011-09-15 LAB — POCT I-STAT, CHEM 8
Calcium, Ion: 0.93 — ABNORMAL LOW
Glucose, Bld: 85
HCT: 42
Hemoglobin: 14.3
Potassium: 4.3

## 2011-09-15 LAB — DIFFERENTIAL
Basophils Absolute: 0.1
Eosinophils Absolute: 0.1
Lymphocytes Relative: 30
Lymphs Abs: 1.6
Neutro Abs: 3.1

## 2011-09-15 LAB — CBC
Hemoglobin: 14.5
MCHC: 34.2
Platelets: ADEQUATE
RDW: 13.6

## 2011-09-15 LAB — ETHANOL: Alcohol, Ethyl (B): 196 — ABNORMAL HIGH

## 2011-09-15 LAB — RAPID URINE DRUG SCREEN, HOSP PERFORMED: Barbiturates: NOT DETECTED

## 2011-09-15 LAB — HEPATIC FUNCTION PANEL
ALT: 37
AST: 50 — ABNORMAL HIGH
Indirect Bilirubin: 0.6
Total Protein: 6.9

## 2011-09-15 LAB — TSH: TSH: 2.285

## 2011-09-18 LAB — URINALYSIS, ROUTINE W REFLEX MICROSCOPIC
Bilirubin Urine: NEGATIVE
Glucose, UA: NEGATIVE
Ketones, ur: NEGATIVE
Protein, ur: NEGATIVE
pH: 6

## 2011-09-18 LAB — LITHIUM LEVEL: Lithium Lvl: <0.25 — ABNORMAL LOW

## 2011-09-18 LAB — RAPID URINE DRUG SCREEN, HOSP PERFORMED
Benzodiazepines: NOT DETECTED
Cocaine: NOT DETECTED
Opiates: NOT DETECTED

## 2011-09-18 LAB — DIFFERENTIAL
Eosinophils Absolute: 0.1
Lymphs Abs: 1.6
Monocytes Relative: 8
Neutro Abs: 3.1
Neutrophils Relative %: 59

## 2011-09-18 LAB — COMPREHENSIVE METABOLIC PANEL
ALT: 39
BUN: 7
CO2: 28
Calcium: 8.8
Creatinine, Ser: 0.79
GFR calc non Af Amer: >60
Glucose, Bld: 89
Total Protein: 6.6

## 2011-09-18 LAB — CBC
HCT: 40
Hemoglobin: 13.6
MCHC: 34
MCV: 88.3
RBC: 4.53
RDW: 14.6

## 2011-09-20 LAB — DIFFERENTIAL
Basophils Absolute: 0
Basophils Absolute: 0
Basophils Absolute: 0
Basophils Absolute: 0
Basophils Relative: 0
Basophils Relative: 0
Eosinophils Absolute: 0.1
Lymphocytes Relative: 21
Lymphocytes Relative: 9 — ABNORMAL LOW
Lymphocytes Relative: 9 — ABNORMAL LOW
Lymphs Abs: 0.9
Monocytes Absolute: 0.3
Monocytes Absolute: 0.4
Monocytes Absolute: 0.6
Monocytes Relative: 6
Neutro Abs: 11.6 — ABNORMAL HIGH
Neutro Abs: 7.3
Neutro Abs: 8.6 — ABNORMAL HIGH
Neutro Abs: 8.6 — ABNORMAL HIGH
Neutrophils Relative %: 72
Neutrophils Relative %: 75

## 2011-09-20 LAB — POCT CARDIAC MARKERS
CKMB, poc: 1.8
Myoglobin, poc: 57.5
Troponin i, poc: <0.05

## 2011-09-20 LAB — URINALYSIS, ROUTINE W REFLEX MICROSCOPIC
Glucose, UA: NEGATIVE
Leukocytes, UA: NEGATIVE
Nitrite: NEGATIVE
Specific Gravity, Urine: 1.015
pH: 5.5

## 2011-09-20 LAB — HEPATIC FUNCTION PANEL
ALT: 29
AST: 27
Albumin: 3.1 — ABNORMAL LOW
Total Protein: 6.3

## 2011-09-20 LAB — B-NATRIURETIC PEPTIDE (CONVERTED LAB): Pro B Natriuretic peptide (BNP): <30

## 2011-09-20 LAB — POCT I-STAT, CHEM 8
Chloride: 104
HCT: 40
Potassium: 3.7
Sodium: 140

## 2011-09-20 LAB — CBC
HCT: 32.7 — ABNORMAL LOW
HCT: 33.4 — ABNORMAL LOW
HCT: 38.5 — ABNORMAL LOW
Hemoglobin: 11.4 — ABNORMAL LOW
Hemoglobin: 11.8 — ABNORMAL LOW
Hemoglobin: 12.4 — ABNORMAL LOW
Hemoglobin: 13.2
MCHC: 34.3
MCHC: 34.6
Platelets: 266
Platelets: 275
RBC: 3.83 — ABNORMAL LOW
RDW: 15.2
RDW: 15.3
RDW: 15.6 — ABNORMAL HIGH
WBC: 11.5 — ABNORMAL HIGH
WBC: 13.3 — ABNORMAL HIGH
WBC: 9.9

## 2011-09-20 LAB — BASIC METABOLIC PANEL
CO2: 28
CO2: 29
Calcium: 8.5
Calcium: 8.6
Creatinine, Ser: 1
GFR calc Af Amer: >60
GFR calc Af Amer: >60
GFR calc non Af Amer: >60
GFR calc non Af Amer: >60
Glucose, Bld: 92
Potassium: 4.1
Sodium: 138
Sodium: 140

## 2011-09-20 LAB — OSMOLALITY: Osmolality: 287

## 2011-09-20 LAB — COMPREHENSIVE METABOLIC PANEL
ALT: 27
Alkaline Phosphatase: 108
BUN: 15
Chloride: 101
Glucose, Bld: 121 — ABNORMAL HIGH
Potassium: 3.7
Sodium: 132 — ABNORMAL LOW
Total Bilirubin: 0.9

## 2011-09-20 LAB — TRICYCLIC ANTIDEPRESSANT EVALUATION
Clomipramine.: <5
Desemethylclomipramine: <5
Imipram+Desipr Total: <5 mcg/L — ABNORMAL LOW (ref 150–300)
Tot Clomipramine+Desmethylclomipramine: NOT DETECTED not reported
Total Desmethyldoxepine+Doxepin: <5 mcg/L — ABNORMAL LOW (ref 100–250)

## 2011-09-20 LAB — TSH: TSH: 0.877

## 2011-09-20 LAB — URINE MICROSCOPIC-ADD ON

## 2011-09-20 LAB — RAPID URINE DRUG SCREEN, HOSP PERFORMED
Barbiturates: NOT DETECTED
Tetrahydrocannabinol: NOT DETECTED

## 2011-09-20 LAB — ACETAMINOPHEN LEVEL: Acetaminophen (Tylenol), Serum: <10 — ABNORMAL LOW

## 2011-09-20 LAB — SALICYLATE LEVEL: Salicylate Lvl: <4

## 2011-09-20 LAB — LIPID PANEL
Cholesterol: 140
HDL: 56
LDL Cholesterol: 78
Total CHOL/HDL Ratio: 2.5
Triglycerides: 29

## 2011-09-20 LAB — CARDIAC PANEL(CRET KIN+CKTOT+MB+TROPI)
CK, MB: 1.7
Relative Index: 1.2

## 2011-09-20 LAB — ETHANOL: Alcohol, Ethyl (B): 14 — ABNORMAL HIGH

## 2011-09-25 LAB — URINALYSIS, ROUTINE W REFLEX MICROSCOPIC
Ketones, ur: 15 — AB
Nitrite: NEGATIVE
Urobilinogen, UA: 1
pH: 5.5

## 2011-09-25 LAB — DIFFERENTIAL
Lymphocytes Relative: 14
Lymphs Abs: 1.4
Neutro Abs: 7.7
Neutrophils Relative %: 77

## 2011-09-25 LAB — URINE CULTURE: Culture: NO GROWTH

## 2011-09-25 LAB — CBC
Hemoglobin: 13.4
MCHC: 33.8
MCV: 87.4
RBC: 4.54

## 2011-09-25 LAB — COMPREHENSIVE METABOLIC PANEL
CO2: 30
Calcium: 8.9
Creatinine, Ser: 0.82
GFR calc non Af Amer: >60
Glucose, Bld: 111 — ABNORMAL HIGH

## 2011-09-25 LAB — URINE MICROSCOPIC-ADD ON

## 2011-10-02 LAB — URINALYSIS, ROUTINE W REFLEX MICROSCOPIC
Hgb urine dipstick: NEGATIVE
Specific Gravity, Urine: 1.013
Urobilinogen, UA: 0.2

## 2011-10-02 LAB — URINE MICROSCOPIC-ADD ON

## 2011-10-03 ENCOUNTER — Ambulatory Visit: Payer: Self-pay | Admitting: Internal Medicine

## 2011-11-03 ENCOUNTER — Ambulatory Visit: Payer: Self-pay | Admitting: Internal Medicine

## 2011-11-07 ENCOUNTER — Ambulatory Visit: Payer: Self-pay | Admitting: Internal Medicine

## 2011-11-18 ENCOUNTER — Emergency Department: Payer: Self-pay | Admitting: Emergency Medicine

## 2011-12-29 ENCOUNTER — Emergency Department: Payer: Self-pay | Admitting: Emergency Medicine

## 2011-12-29 LAB — DRUG SCREEN, URINE
Barbiturates, Ur Screen: NEGATIVE (ref ?–200)
Cannabinoid 50 Ng, Ur ~~LOC~~: NEGATIVE (ref ?–50)
Cocaine Metabolite,Ur ~~LOC~~: NEGATIVE (ref ?–300)
MDMA (Ecstasy)Ur Screen: NEGATIVE (ref ?–500)
Methadone, Ur Screen: NEGATIVE (ref ?–300)
Phencyclidine (PCP) Ur S: NEGATIVE (ref ?–25)

## 2011-12-29 LAB — COMPREHENSIVE METABOLIC PANEL
Alkaline Phosphatase: 126 U/L (ref 50–136)
BUN: 11 mg/dL (ref 7–18)
Bilirubin,Total: 0.6 mg/dL (ref 0.2–1.0)
Calcium, Total: 8.5 mg/dL (ref 8.5–10.1)
Co2: 23 mmol/L (ref 21–32)
Creatinine: 0.65 mg/dL (ref 0.60–1.30)
EGFR (African American): >60
EGFR (Non-African Amer.): >60
Glucose: 61 mg/dL — ABNORMAL LOW (ref 65–99)
SGOT(AST): 43 U/L — ABNORMAL HIGH (ref 15–37)
SGPT (ALT): 23 U/L
Total Protein: 7.5 g/dL (ref 6.4–8.2)

## 2011-12-29 LAB — CBC
HCT: 38.4 % — ABNORMAL LOW (ref 40.0–52.0)
MCH: 29.7 pg (ref 26.0–34.0)
MCHC: 34.1 g/dL (ref 32.0–36.0)
MCV: 87 fL (ref 80–100)
Platelet: 244 10*3/uL (ref 150–440)
RDW: 14.6 % — ABNORMAL HIGH (ref 11.5–14.5)
WBC: 6.5 10*3/uL (ref 3.8–10.6)

## 2011-12-29 LAB — URINALYSIS, COMPLETE
Bilirubin,UR: NEGATIVE
Hyaline Cast: 2
Nitrite: NEGATIVE
Ph: 5 (ref 4.5–8.0)
Protein: 100
RBC,UR: 4 /HPF (ref 0–5)
Squamous Epithelial: 4

## 2011-12-29 LAB — ACETAMINOPHEN LEVEL: Acetaminophen: <2 ug/mL

## 2011-12-29 LAB — TSH: Thyroid Stimulating Horm: 0.66 u[IU]/mL

## 2011-12-29 LAB — ETHANOL: Ethanol %: 0.091 % — ABNORMAL HIGH (ref 0.000–0.080)

## 2011-12-29 LAB — SALICYLATE LEVEL: Salicylates, Serum: 2.8 mg/dL

## 2012-02-06 ENCOUNTER — Ambulatory Visit: Payer: Self-pay | Admitting: Urology

## 2012-02-20 ENCOUNTER — Ambulatory Visit: Payer: Self-pay | Admitting: Urology

## 2012-03-21 ENCOUNTER — Ambulatory Visit: Payer: Self-pay | Admitting: Urology

## 2012-05-13 ENCOUNTER — Ambulatory Visit: Payer: Self-pay | Admitting: Orthopedic Surgery

## 2012-05-13 LAB — CBC
HGB: 13.2 g/dL (ref 13.0–18.0)
MCHC: 33.8 g/dL (ref 32.0–36.0)
MCV: 86 fL (ref 80–100)
Platelet: 251 10*3/uL (ref 150–440)
RBC: 4.54 10*6/uL (ref 4.40–5.90)
WBC: 6.4 10*3/uL (ref 3.8–10.6)

## 2012-05-13 LAB — URINALYSIS, COMPLETE
Bilirubin,UR: NEGATIVE
Ketone: NEGATIVE
Protein: NEGATIVE
Specific Gravity: 1.008 (ref 1.003–1.030)
Squamous Epithelial: 2

## 2012-05-13 LAB — BASIC METABOLIC PANEL
Anion Gap: 9 (ref 7–16)
Calcium, Total: 8.6 mg/dL (ref 8.5–10.1)
Chloride: 101 mmol/L (ref 98–107)
Co2: 27 mmol/L (ref 21–32)
Creatinine: 0.86 mg/dL (ref 0.60–1.30)
Glucose: 98 mg/dL (ref 65–99)
Osmolality: 274 (ref 275–301)

## 2012-05-13 LAB — PROTIME-INR: Prothrombin Time: 13.4 secs (ref 11.5–14.7)

## 2012-05-13 LAB — APTT: Activated PTT: 31.1 secs (ref 23.6–35.9)

## 2012-07-24 ENCOUNTER — Ambulatory Visit: Payer: Self-pay | Admitting: Internal Medicine

## 2012-08-27 ENCOUNTER — Ambulatory Visit: Payer: Self-pay | Admitting: Orthopedic Surgery

## 2012-08-27 LAB — URINALYSIS, COMPLETE
Bilirubin,UR: NEGATIVE
Hyaline Cast: 1
Ketone: NEGATIVE
Ph: 5 (ref 4.5–8.0)
Protein: NEGATIVE
Specific Gravity: 1.013 (ref 1.003–1.030)
Squamous Epithelial: <1
WBC UR: 33 /HPF (ref 0–5)

## 2012-08-27 LAB — BASIC METABOLIC PANEL
Anion Gap: 9 (ref 7–16)
Calcium, Total: 9.3 mg/dL (ref 8.5–10.1)
Co2: 26 mmol/L (ref 21–32)
EGFR (African American): >60
EGFR (Non-African Amer.): >60
Glucose: 84 mg/dL (ref 65–99)
Osmolality: 276 (ref 275–301)

## 2012-08-27 LAB — CBC
HCT: 43.3 % (ref 40.0–52.0)
HGB: 14.9 g/dL (ref 13.0–18.0)
MCV: 85 fL (ref 80–100)
Platelet: 274 10*3/uL (ref 150–440)
RDW: 15.6 % — ABNORMAL HIGH (ref 11.5–14.5)
WBC: 6.8 10*3/uL (ref 3.8–10.6)

## 2012-08-27 LAB — APTT: Activated PTT: 31.7 secs (ref 23.6–35.9)

## 2012-08-27 LAB — PROTIME-INR
INR: 0.9
Prothrombin Time: 12.7 secs (ref 11.5–14.7)

## 2012-08-28 ENCOUNTER — Other Ambulatory Visit: Payer: Self-pay | Admitting: Orthopedic Surgery

## 2012-09-04 ENCOUNTER — Inpatient Hospital Stay: Payer: Self-pay | Admitting: Orthopedic Surgery

## 2012-09-04 LAB — CBC WITH DIFFERENTIAL/PLATELET
Basophil #: 0 10*3/uL (ref 0.0–0.1)
Basophil %: 0.1 %
Eosinophil #: 0 10*3/uL (ref 0.0–0.7)
MCH: 28.8 pg (ref 26.0–34.0)
MCV: 84 fL (ref 80–100)
Monocyte #: 0.7 x10 3/mm (ref 0.2–1.0)
Monocyte %: 7.1 %
Neutrophil #: 9 10*3/uL — ABNORMAL HIGH (ref 1.4–6.5)
Platelet: 240 10*3/uL (ref 150–440)
RDW: 15.6 % — ABNORMAL HIGH (ref 11.5–14.5)
WBC: 10.3 10*3/uL (ref 3.8–10.6)

## 2012-09-04 LAB — COMPREHENSIVE METABOLIC PANEL
Alkaline Phosphatase: 115 U/L (ref 50–136)
Anion Gap: 11 (ref 7–16)
BUN: 15 mg/dL (ref 7–18)
Bilirubin,Total: 0.5 mg/dL (ref 0.2–1.0)
Chloride: 100 mmol/L (ref 98–107)
Co2: 22 mmol/L (ref 21–32)
Creatinine: 1.2 mg/dL (ref 0.60–1.30)
EGFR (African American): >60
Osmolality: 270 (ref 275–301)
SGOT(AST): 44 U/L — ABNORMAL HIGH (ref 15–37)
SGPT (ALT): 30 U/L (ref 12–78)
Total Protein: 6.9 g/dL (ref 6.4–8.2)

## 2012-09-04 LAB — URINALYSIS, COMPLETE
Bacteria: NONE SEEN
Bilirubin,UR: NEGATIVE
Glucose,UR: NEGATIVE mg/dL (ref 0–75)
Hyaline Cast: 4
Ketone: NEGATIVE
Nitrite: NEGATIVE
Specific Gravity: 1.01 (ref 1.003–1.030)
Squamous Epithelial: <1

## 2012-09-05 LAB — CBC WITH DIFFERENTIAL/PLATELET
Basophil %: 0.1 %
Eosinophil #: 0 10*3/uL (ref 0.0–0.7)
Eosinophil %: 0 %
HGB: 11.3 g/dL — ABNORMAL LOW (ref 13.0–18.0)
Lymphocyte #: 0.7 10*3/uL — ABNORMAL LOW (ref 1.0–3.6)
Lymphocyte %: 7.2 %
MCH: 29.2 pg (ref 26.0–34.0)
MCV: 84 fL (ref 80–100)
Monocyte #: 0.7 x10 3/mm (ref 0.2–1.0)
Platelet: 210 10*3/uL (ref 150–440)
RBC: 3.88 10*6/uL — ABNORMAL LOW (ref 4.40–5.90)

## 2012-09-05 LAB — BASIC METABOLIC PANEL
Anion Gap: 12 (ref 7–16)
BUN: 18 mg/dL (ref 7–18)
Chloride: 96 mmol/L — ABNORMAL LOW (ref 98–107)
Creatinine: 1.46 mg/dL — ABNORMAL HIGH (ref 0.60–1.30)
Glucose: 176 mg/dL — ABNORMAL HIGH (ref 65–99)
Potassium: 4.3 mmol/L (ref 3.5–5.1)

## 2012-09-06 LAB — CBC WITH DIFFERENTIAL/PLATELET
Basophil #: 0 10*3/uL (ref 0.0–0.1)
Basophil %: 0 %
Eosinophil %: 0 %
HCT: 29.1 % — ABNORMAL LOW (ref 40.0–52.0)
HGB: 9.9 g/dL — ABNORMAL LOW (ref 13.0–18.0)
Lymphocyte #: 0.6 10*3/uL — ABNORMAL LOW (ref 1.0–3.6)
Lymphocyte %: 5.7 %
MCV: 84 fL (ref 80–100)
Monocyte %: 8.4 %
Neutrophil #: 9.7 10*3/uL — ABNORMAL HIGH (ref 1.4–6.5)
RBC: 3.49 10*6/uL — ABNORMAL LOW (ref 4.40–5.90)
WBC: 11.3 10*3/uL — ABNORMAL HIGH (ref 3.8–10.6)

## 2012-09-06 LAB — BASIC METABOLIC PANEL
Anion Gap: 9 (ref 7–16)
Calcium, Total: 8.7 mg/dL (ref 8.5–10.1)
Chloride: 94 mmol/L — ABNORMAL LOW (ref 98–107)
Creatinine: 1.13 mg/dL (ref 0.60–1.30)
EGFR (African American): >60
EGFR (Non-African Amer.): >60
Glucose: 122 mg/dL — ABNORMAL HIGH (ref 65–99)
Osmolality: 266 (ref 275–301)
Potassium: 4.6 mmol/L (ref 3.5–5.1)
Sodium: 131 mmol/L — ABNORMAL LOW (ref 136–145)

## 2012-09-07 LAB — CBC WITH DIFFERENTIAL/PLATELET
Basophil #: 0 10*3/uL (ref 0.0–0.1)
Basophil %: 0.1 %
Eosinophil #: 0 10*3/uL (ref 0.0–0.7)
HCT: 27.3 % — ABNORMAL LOW (ref 40.0–52.0)
HGB: 9.5 g/dL — ABNORMAL LOW (ref 13.0–18.0)
Lymphocyte #: 0.8 10*3/uL — ABNORMAL LOW (ref 1.0–3.6)
MCHC: 34.7 g/dL (ref 32.0–36.0)
MCV: 83 fL (ref 80–100)
Monocyte %: 7.2 %
Neutrophil #: 9.8 10*3/uL — ABNORMAL HIGH (ref 1.4–6.5)
Neutrophil %: 85.4 %
Platelet: 220 10*3/uL (ref 150–440)
RBC: 3.28 10*6/uL — ABNORMAL LOW (ref 4.40–5.90)
RDW: 15.5 % — ABNORMAL HIGH (ref 11.5–14.5)
WBC: 11.4 10*3/uL — ABNORMAL HIGH (ref 3.8–10.6)

## 2012-09-07 LAB — BASIC METABOLIC PANEL
Anion Gap: 9 (ref 7–16)
BUN: 18 mg/dL (ref 7–18)
Chloride: 97 mmol/L — ABNORMAL LOW (ref 98–107)
Creatinine: 1.01 mg/dL (ref 0.60–1.30)
EGFR (African American): >60
EGFR (Non-African Amer.): >60
Glucose: 143 mg/dL — ABNORMAL HIGH (ref 65–99)
Osmolality: 271 (ref 275–301)
Potassium: 4.1 mmol/L (ref 3.5–5.1)

## 2012-09-08 ENCOUNTER — Encounter: Payer: Self-pay | Admitting: Internal Medicine

## 2012-09-09 LAB — PATHOLOGY REPORT

## 2013-05-27 DIAGNOSIS — N529 Male erectile dysfunction, unspecified: Secondary | ICD-10-CM | POA: Insufficient documentation

## 2013-07-17 DIAGNOSIS — I10 Essential (primary) hypertension: Secondary | ICD-10-CM | POA: Insufficient documentation

## 2013-07-25 ENCOUNTER — Emergency Department: Payer: Self-pay | Admitting: Emergency Medicine

## 2014-05-30 ENCOUNTER — Inpatient Hospital Stay: Payer: Self-pay | Admitting: Internal Medicine

## 2014-05-30 LAB — COMPREHENSIVE METABOLIC PANEL
ALBUMIN: 3.2 g/dL — AB (ref 3.4–5.0)
ANION GAP: 4 — AB (ref 7–16)
Alkaline Phosphatase: 130 U/L — ABNORMAL HIGH
BILIRUBIN TOTAL: 0.2 mg/dL (ref 0.2–1.0)
BUN: 10 mg/dL (ref 7–18)
CO2: 26 mmol/L (ref 21–32)
CREATININE: 1.09 mg/dL (ref 0.60–1.30)
Calcium, Total: 8.4 mg/dL — ABNORMAL LOW (ref 8.5–10.1)
Chloride: 102 mmol/L (ref 98–107)
EGFR (Non-African Amer.): >60
Glucose: 125 mg/dL — ABNORMAL HIGH (ref 65–99)
Osmolality: 265 (ref 275–301)
POTASSIUM: 4 mmol/L (ref 3.5–5.1)
SGOT(AST): 27 U/L (ref 15–37)
SGPT (ALT): 19 U/L (ref 12–78)
SODIUM: 132 mmol/L — AB (ref 136–145)
Total Protein: 6.9 g/dL (ref 6.4–8.2)

## 2014-05-30 LAB — CBC
HCT: 39.3 % — ABNORMAL LOW (ref 40.0–52.0)
HGB: 13.3 g/dL (ref 13.0–18.0)
MCH: 28.6 pg (ref 26.0–34.0)
MCHC: 33.8 g/dL (ref 32.0–36.0)
MCV: 85 fL (ref 80–100)
Platelet: 250 10*3/uL (ref 150–440)
RBC: 4.64 10*6/uL (ref 4.40–5.90)
RDW: 15.5 % — ABNORMAL HIGH (ref 11.5–14.5)
WBC: 6.4 10*3/uL (ref 3.8–10.6)

## 2014-05-30 LAB — CK TOTAL AND CKMB (NOT AT ARMC)
CK, TOTAL: 351 U/L — AB
CK, Total: 322 U/L — ABNORMAL HIGH
CK-MB: 2 ng/mL (ref 0.5–3.6)
CK-MB: 2.3 ng/mL (ref 0.5–3.6)

## 2014-05-30 LAB — APTT
ACTIVATED PTT: 156.7 s — AB (ref 23.6–35.9)
ACTIVATED PTT: 98.6 s — AB (ref 23.6–35.9)
Activated PTT: 29.6 secs (ref 23.6–35.9)

## 2014-05-30 LAB — PROTIME-INR
INR: 1
PROTHROMBIN TIME: 13.5 s (ref 11.5–14.7)

## 2014-05-30 LAB — TROPONIN I
Troponin-I: <0.02 ng/mL
Troponin-I: <0.02 ng/mL

## 2014-05-30 LAB — MAGNESIUM: Magnesium: 1.8 mg/dL

## 2014-05-30 LAB — LIPASE, BLOOD: LIPASE: 218 U/L (ref 73–393)

## 2014-05-31 LAB — CBC WITH DIFFERENTIAL/PLATELET
Basophil #: 0 10*3/uL (ref 0.0–0.1)
Basophil %: 0.7 %
Eosinophil #: 0.2 10*3/uL (ref 0.0–0.7)
Eosinophil %: 3.9 %
HCT: 38.2 % — ABNORMAL LOW (ref 40.0–52.0)
HGB: 12.7 g/dL — ABNORMAL LOW (ref 13.0–18.0)
Lymphocyte #: 1.7 10*3/uL (ref 1.0–3.6)
Lymphocyte %: 29 %
MCH: 28.1 pg (ref 26.0–34.0)
MCHC: 33.1 g/dL (ref 32.0–36.0)
MCV: 85 fL (ref 80–100)
Monocyte #: 0.5 x10 3/mm (ref 0.2–1.0)
Monocyte %: 9.3 %
Neutrophil #: 3.3 10*3/uL (ref 1.4–6.5)
Neutrophil %: 57.1 %
Platelet: 232 10*3/uL (ref 150–440)
RBC: 4.51 10*6/uL (ref 4.40–5.90)
RDW: 15.4 % — ABNORMAL HIGH (ref 11.5–14.5)
WBC: 5.9 10*3/uL (ref 3.8–10.6)

## 2014-05-31 LAB — LIPID PANEL
CHOLESTEROL: 138 mg/dL (ref 0–200)
HDL: 40 mg/dL (ref 40–60)
Ldl Cholesterol, Calc: 83 mg/dL (ref 0–100)
Triglycerides: 75 mg/dL (ref 0–200)
VLDL Cholesterol, Calc: 15 mg/dL (ref 5–40)

## 2014-05-31 LAB — BASIC METABOLIC PANEL
Anion Gap: 6 — ABNORMAL LOW (ref 7–16)
BUN: 10 mg/dL (ref 7–18)
Calcium, Total: 8.4 mg/dL — ABNORMAL LOW (ref 8.5–10.1)
Chloride: 105 mmol/L (ref 98–107)
Co2: 26 mmol/L (ref 21–32)
Creatinine: 1.11 mg/dL (ref 0.60–1.30)
EGFR (African American): >60
EGFR (Non-African Amer.): >60
Glucose: 99 mg/dL (ref 65–99)
Osmolality: 273 (ref 275–301)
Potassium: 4.3 mmol/L (ref 3.5–5.1)
Sodium: 137 mmol/L (ref 136–145)

## 2014-05-31 LAB — APTT
Activated PTT: 149.9 secs — ABNORMAL HIGH (ref 23.6–35.9)
Activated PTT: 56.3 secs — ABNORMAL HIGH (ref 23.6–35.9)
Activated PTT: 68.7 secs — ABNORMAL HIGH (ref 23.6–35.9)

## 2014-06-01 LAB — CBC
HCT: 38.4 % — ABNORMAL LOW (ref 40.0–52.0)
HGB: 13 g/dL (ref 13.0–18.0)
MCH: 28.4 pg (ref 26.0–34.0)
MCHC: 33.8 g/dL (ref 32.0–36.0)
MCV: 84 fL (ref 80–100)
PLATELETS: 241 10*3/uL (ref 150–440)
RBC: 4.57 10*6/uL (ref 4.40–5.90)
RDW: 15.4 % — ABNORMAL HIGH (ref 11.5–14.5)
WBC: 7.1 10*3/uL (ref 3.8–10.6)

## 2014-06-01 LAB — BASIC METABOLIC PANEL
Anion Gap: 7 (ref 7–16)
BUN: 13 mg/dL (ref 7–18)
Calcium, Total: 8.7 mg/dL (ref 8.5–10.1)
Chloride: 105 mmol/L (ref 98–107)
Co2: 24 mmol/L (ref 21–32)
Creatinine: 1.11 mg/dL (ref 0.60–1.30)
EGFR (African American): >60
Glucose: 98 mg/dL (ref 65–99)
Osmolality: 272 (ref 275–301)
Potassium: 4.2 mmol/L (ref 3.5–5.1)
SODIUM: 136 mmol/L (ref 136–145)

## 2014-06-01 LAB — APTT: Activated PTT: 70.6 secs — ABNORMAL HIGH (ref 23.6–35.9)

## 2014-06-04 LAB — CULTURE, BLOOD (SINGLE)

## 2015-02-20 ENCOUNTER — Emergency Department: Payer: Self-pay | Admitting: Emergency Medicine

## 2015-04-03 ENCOUNTER — Emergency Department
Admit: 2015-04-03 | Disposition: A | Discharge status: Home / Self Care | Payer: Self-pay | Admitting: Emergency Medicine

## 2015-04-03 LAB — CBC
HCT: 41.2 % (ref 40.0–52.0)
HGB: 13.8 g/dL (ref 13.0–18.0)
MCH: 28.6 pg (ref 26.0–34.0)
MCHC: 33.6 g/dL (ref 32.0–36.0)
MCV: 85 fL (ref 80–100)
Platelet: 205 10*3/uL (ref 150–440)
RBC: 4.82 10*6/uL (ref 4.40–5.90)
RDW: 15.1 % — ABNORMAL HIGH (ref 11.5–14.5)
WBC: 11.7 10*3/uL — ABNORMAL HIGH (ref 3.8–10.6)

## 2015-04-03 LAB — COMPREHENSIVE METABOLIC PANEL
ALT: 17 U/L
Albumin: 3.5 g/dL
Alkaline Phosphatase: 104 U/L
Anion Gap: 5 — ABNORMAL LOW (ref 7–16)
BUN: 8 mg/dL
Bilirubin,Total: 0.9 mg/dL
CALCIUM: 7.9 mg/dL — AB
CHLORIDE: 95 mmol/L — AB
CO2: 25 mmol/L
Creatinine: 0.83 mg/dL
EGFR (Non-African Amer.): >60
Glucose: 129 mg/dL — ABNORMAL HIGH
POTASSIUM: 3.6 mmol/L
SGOT(AST): 23 U/L
SODIUM: 125 mmol/L — AB
Total Protein: 7.2 g/dL

## 2015-04-03 LAB — URINALYSIS, COMPLETE
BACTERIA: NONE SEEN
Bilirubin,UR: NEGATIVE
Glucose,UR: NEGATIVE mg/dL (ref 0–75)
Ketone: NEGATIVE
Leukocyte Esterase: NEGATIVE
Nitrite: NEGATIVE
Ph: 7 (ref 4.5–8.0)
Protein: NEGATIVE
SPECIFIC GRAVITY: 1.004 (ref 1.003–1.030)

## 2015-04-03 LAB — TROPONIN I

## 2015-04-03 LAB — LIPASE, BLOOD: LIPASE: 29 U/L

## 2015-04-06 NOTE — Discharge Summary (Signed)
PATIENT NAME:  SQUARE, [name] MR#:  [id number] DATE OF BIRTH:  12/13/1935  DATE OF ADMISSION:  09/04/2012 DATE OF DISCHARGE:  09/07/2012  ADMITTING DIAGNOSIS: Right hip osteoarthritis status post total hip arthroplasty.   HISTORY OF PRESENT ILLNESS: [name] is a 80 year old male who has suffered for several years with severe right hip osteoarthritis. He had elected to undergo a total hip arthroplasty and was admitted to the hospital for postoperative care.   PAST MEDICAL HISTORY:  1. Brain tumor.  2. Coronary artery disease. 3. Peripheral vascular disease.  4. Previous left hip surgery for fracture. 5. Blind in left eye. 6. Cigarette smoking. 7. Hypertension. 8. Chronic bursitis. 9. Chronic obstructive pulmonary disease. 10. History of gastritis. 11. Chronic low back pain. 12. Bipolar disorder. 13. Status post cardiac stents. 14. History of urinary tract infection. 15. Depression. 16. Previous penile implant surgery and removal.  HOME MEDICATIONS: Chlorthalidone.   ALLERGIES: No known drug allergies.   HOSPITAL COURSE: The patient was admitted on 09/04/2012, postoperatively. He was admitted to the orthopedic floor. He was in stable condition. He remained on postoperative antibiotics for 24 hours along with TED stockings and AV-1 for deep vein thrombosis prophylaxis. He continued to use these throughout his hospitalization. The patient upon presentation to the floor initially postoperative he had some respiratory distress and was thought to have aspirated. A chest x-ray was ordered and showed no acute changes. He was started on steroids and inhalers for chronic obstructive pulmonary disease which was based on pulmonary function test studies. He is a smoker. Following initial respiratory distress, the patient stabilized. He had no further issues postoperative with his breathing. There was no longer any stridor. He was encouraged to use incentive spirometry throughout his hospitalization. He  had daily labs checked including a CBC and basic metabolic profile. He had mild hyponatremia which was asymptomatic during his hospitalization and this improved throughout his course. By postoperative day number two, the patient's Foley catheter was removed and he was urinating. He also had a bowel movement. The patient was seen and evaluated by physical and occupational therapy on postoperative day number one and they continued to follow him throughout his hospital stay. By postoperative day number three, his hemoglobin and hematocrit remained stable. He never required transfusion during his hospitalization. Clinically he was doing very well and he was prepared for discharge to rehab given his clinical improvement.   DISCHARGE INSTRUCTIONS:  1. The patient will be discharged on Lovenox 40 mg subcutaneous daily for deep vein thrombosis prophylaxis for a total of four weeks. He will continue using his TED stockings.  2. The patient needs physical and occupational therapy during rehab for gait training, lower extremity strengthening, and activities of daily living, respectively.  3. The patient needs to continue to follow posterior hip precautions.  4. He may have ice applied to the right wound site. He should have dry sterile dressing changes daily until completely dry. Steri-Strips may be replaced. Sutures will be removed in the orthopedic office when he follows up in approximately 10 days.  5. The patient will be discharged on oxycodone 5 mg tablets to be taken 1 to 2 tabs every 4 to 6 hours p.r.n. for pain. 6. The patient will followup in the orthopedic office in approximately 10 days for wound check and reevaluation.   DISCHARGE MEDICATIONS: 1. Symbicort 160 mcg/4.5 mcg inhaler 2 puffs twice a day. 2. Chlorthalidone 25 mg 1/2 tablet once daily at bedtime.  3. Spiriva 18 mcg inhaled  capsule 1 tablet daily p.r.n.  4. Ferrous sulfate 325 mg p.o. twice a day. 5. Nicotine patch 21 mcg transdermal  daily.     DISCHARGE DIAGNOSES:  1. Acute respiratory distress postoperative. 2. Right hip osteoarthritis status post total hip arthroplasty.  ____________________________ Timoteo Gaul, MD klk:slb D: 09/07/2012 08:29:55 ET T: 09/07/2012 08:58:29 ET JOB#: [id number]  cc: Timoteo Gaul, MD, <Dictator> Timoteo Gaul MD ELECTRONICALLY SIGNED 09/09/2012 17:20

## 2015-04-06 NOTE — Consult Note (Signed)
PATIENT NAME:  [name], [name] MR#:  [id number] DATE OF BIRTH:  February 28, 1935  DATE OF CONSULTATION:  09/04/2012  REFERRING PHYSICIAN:  Dr. Thornton Park  CONSULTING PHYSICIAN:  Ceasar Lund. Anselm Jungling, MD  REASON FOR CONSULTATION: Acute shortness of breath with stridor.   CONSULT TIMEFRAME: Stat.   PRESENTATION: This is a 80 year old male with past medical history of chronic obstructive pulmonary disease and he is a current smoker: He was scheduled for right total hip replacement surgery which he had undergone today after having spinal anesthesia and mild sedation then he was returned to floor postoperative management. After coming to floor within one hour he started feeling tightness in his throat and mild difficulty in swallowing. That feeling was like a blocking sensation in the throat and he did not complain any choking feeling. He also felt that as if he is feeling short of breath but there was no any acute wheezing or no drop in his oxygen level as per the nurses on the floor. Rapid response was activated by the nurses and the rapid response team saw him while he was hemodynamically stable but had stridor and some discomfort in his throat and so medical consult was called immediately. On further questioning he denied any cough or fever recently. He denied any similar episode in the past and his chronic obstructive pulmonary disease was stable with his regular use of medications and inhalers.   REVIEW OF SYSTEMS: CONSTITUTIONAL: He denied any fever, fatigue, weakness, or weight loss. EYES: Denies any eye changes, vision changes, any redness or burning in the eye. ENT: Denied any ringing in the ears, ear pain, hearing loss or any drainage from the ears. RESPIRATORY: Denies any cough or sputum production but had feeling of shortness of breath. CARDIOVASCULAR: Denies any chest pain, any arrhythmia or palpitations or any syncope. GASTROINTESTINAL: He felt some blocking sensation in his throat and it was  difficult for him to swallow at that time but denied any vomiting or diarrhea, was feeling nauseous because of this feeling but had not vomited anything. MUSCULOSKELETAL: Denied any joint swelling. Had undergone right hip replacement surgery and is feeling pain because of that. NEUROLOGICAL: Denies any numbness or weakness in his arms or legs. PSYCHIATRIC: Mild anxiety due to current complaint.   PAST MEDICAL HISTORY: Chronic obstructive pulmonary disease, current smoker.   PAST SURGICAL HISTORY: Undergone right hip replacement surgery today.   ALLERGIES: No known drug allergies.   MEDICATIONS AT HOME:  1. Spiriva.  2. Combivent.   SOCIAL HISTORY: Current smoker. Social drinker of alcohol. Denies any illegal drug use.   FAMILY HISTORY: No history of cancer in family.   PHYSICAL EXAMINATION:  VITAL SIGNS: Temperature 97.4, pulse rate 73, respirations 22, blood pressure 110/58, pulse oximetry 100% with 2 liters nasal cannula oxygen.   GENERAL: Mild distress due to presenting complaint of throat discomfort and short of breath.  HEAD AND NECK: No signs of trauma. Conjunctivae pink. Oral mucosa moist. Throat no mass. No JVD. Supple, nontender. No lymphadenopathy. Expiratory stridors heard.    RESPIRATORY: Bilateral equal air entry. Expiratory stridor present. No rhonchi or crepitations present.   CARDIOVASCULAR: S1, S2 present and regular. No murmur.   ABDOMEN: Soft, bowel sounds present, nontender. Surgical site dressing present.   EXTREMITIES: No edema. No rashes.   NEUROLOGICAL: No weakness or numbness.  LABORATORY, DIAGNOSTIC AND RADIOLOGICAL DATA: At the time of consult when I saw patient first time there were no labs ordered or done because patient was an orthopedic  surgery patient but later on I ordered the labs and followed in the daytime and they are as follows: Glucose 151, BUN 15, creatinine 1.2, sodium 133, potassium 4.2, chloride 100, CO2 22, total protein 6.9, albumin 3.3,  bilirubin 0.5, alkaline phosphatase 115, SGOT 44, SGPT 30, troponin 0.02, WBC 10.3, RBC 4.46, hemoglobin 12.9, platelet 240. Urinalysis negative. Chest x-ray grossly negative. EKG done and revealed normal sinus rhythm  RECOMMENDATIONS:   1. Postop expiratory stridors, history of chronic obstructive pulmonary disease, current smoker and feeling of nausea, possibly secondary to small aspiration or acid reflux during or after the surgery. No hemodynamic instability at the time of presentation and then followed two times throughout the day, remained hemodynamically stable and oxygen level also remained stable and very satisfactory. Immediately after the episode he was given racemic epinephrine inhalation and he had significant improvement after that so we will continue racemic epinephrine inhalation every four hours as needed.  2. History of chronic obstructive pulmonary disease. Will start him on DuoNeb nebulizer four hourly, Solu-Medrol intravenous and oxygen to be continued via nasal cannula and monitor oxygen saturation. 3. History of smoking. Recommend nicotine patch while he is in the hospital.  4. All labs are done and followed, noncontributory. Pulmonary consult with Dr. Humphrey Rolls. I already spoke with Dr. Humphrey Rolls. Reglan IV and Nexium IV for nausea and acid reflux pain. Management and postop care as per ortho. CODE STATUS: FULL CODE. Smoking cessation explained, five minutes.   TOTAL TIME SPENT: 50 minutes.  ____________________________ Ceasar Lund Anselm Jungling, MD vgv:cms D: 09/04/2012 20:37:40 ET T: 09/05/2012 12:39:43 ET JOB#: [id number]  cc: Ceasar Lund. Anselm Jungling, MD, <Dictator> Vaughan Basta MD ELECTRONICALLY SIGNED 09/10/2012 15:30

## 2015-04-06 NOTE — Op Note (Signed)
PATIENT NAME:  [name], [name] MR#:  [id number] DATE OF BIRTH:  11/08/1935  DATE OF PROCEDURE:  09/04/2012  PREOPERATIVE DIAGNOSIS: Right hip osteoarthritis.   POSTOPERATIVE DIAGNOSIS: Right hip osteoarthritis.   PROCEDURE: Right total hip arthroplasty.   SURGEON: Timoteo Gaul, MD  ASSISTANT: Dr. Earnestine Leys  ANESTHESIA: Spinal.   SPECIMEN: Femoral head to pathology.   COMPLICATIONS: None.   ESTIMATED BLOOD LOSS: 200 mL.  IMPLANTS: Stryker Accolade TMZF size 3.5 femoral stem, a Tritanium 50 mm acetabular shell component with a 0 degree X3 polyethylene liner and a +10 Stryker femoral head V40 taper head 36 mm diameter head.   INDICATIONS FOR PROCEDURE: Patient is a 80 year old male who has severe osteoarthritis by hip radiograph. He has bone-on-bone contact with subchondral cysts and sclerosis. Patient has had severe pain and ambulates with assistance of a walker. His osteoarthritis has caused severe pain and disability. He wishes to proceed with a total hip arthroplasty.   I reviewed the risks and benefits of surgery with the patient in my office prior to the date of surgery. He understood that the risks include infection requiring the removal of the components, bleeding requiring blood transfusion, nerve or blood vessel injury especially the sciatic nerve leading to permanent foot drop and the use of a long-term ankle-foot arthrosis, leg length discrepancy, change in lower extremity rotation, persistent right hip pain, failure of the components and the need for revision hip surgery. Medical risks include, but are not limited to, deep venous thrombosis and pulmonary embolism, myocardial infarction, pneumonia, respiratory failure and death. Patient understood these risks and wished to proceed with surgery. I answered all the questions that he had regarding the consent form and the procedure. I explained the surgery to him in detail.   PROCEDURE NOTE: Patient was met in the preoperative  area. His family member was at the bedside. I signed the right hip with the word "yes" according to the hospital's right site protocol. I updated patient's history and physical as there was no change in his medical condition compared to when I saw him earlier in the week in my office. Patient had urinalysis showing a clean urine. He was successfully treated for a urinary tract infection prior to surgery with Septra.   Patient was then brought to the Operating Room where he underwent a spinal anesthetic by the anesthesia department. He was then placed in a left lateral decubitus position. An axillary roll was placed under his left side and additional padding to protect the left common peroneal nerve was also provided. He was positioned on a pegboard. All bony prominences were adequately padded. He was prepped and draped in sterile fashion.   Timeout was performed to verify the patient's name, date of birth, medical record number, correct site of surgery and correct procedure to be performed. It was also used to verify patient had received antibiotics and that all appropriate instruments, implants, and radiographic studies were available in the room. Once all in attendance were in agreement the case began.   A proposed curvilinear incision based over the greater trochanter was drawn out with a surgical marker and based upon bony landmarks. A #10 blade was used to make the curvilinear incision. Subcutaneous tissue was dissected using electrocautery. The fascia lata was then identified and sharply incised with a deep #10 blade. A Charnley retractor was then used to help with hip exposure. The underlying hip bursa was identified and dissected. The hip external rotators were detached from their posterior femoral attachment  and reflected posteriorly to protect the sciatic nerve. This revealed the underlying capsule. A T-shaped capsulotomy was then performed and tagged with #2 Ti-Cron for later repair. The hip was  then carefully dislocated. A proximal femoral osteotomy was performed approximately 15 mm above the lesser trochanter in accordance with the preoperative templating. Once the osteotomy was successfully performed, the attention was turned to acetabular preparation.   Two Hohmann retractors were placed, one anterior and one posterior to the acetabulum. This allowed excellent visualization of the acetabulum. Residual labrum was resected. Patient then had basket reamers used to prepare the acetabulum for implant of the acetabular component. The 45 mm reamer was used to initially medialize the acetabulum. Then sequential basket reamers were used until the best fit was achieved with the patient's native acetabulum. This was a 50 mm reaming basket. 58 mm trial was then implanted and found to have excellent fit. The acetabulum was then copiously irrigated. The actual Stryker Tritanium 58 mm acetabular shell was then impacted into place. Its position was confirmed with an external alignment guide. Two screws were placed through the cluster holes in the top of the Tritanium acetabular shell. These were measured with a depth gauge prior to insertion. A trial liner was then placed in the acetabular shell.   The attention was then turned back to femoral preparation.   Patient had a femoral neck skid placed under the calcar. This allowed for excellent visualization of the proximal femur. A box osteotome was used to make the initial entry into the proximal femur. A hand reamer was also inserted into the femoral canal. A canal finder was then used to ensure that no penetration to the femoral cortex occurred during reaming. Then sequential femoral broaches starting with a size zero were inserted in the proximal femur. The best medial and lateral fit was achieved with a size 3 femoral component. The 127 degrees and 130 degree femoral neck angle trials along with a +0 trial was then placed on the femoral broach and reduced. The  patient had excellent range of motion but had too much longitudinal excursion or shuck. Therefore, a +5 and a +10 mm femoral head trials were also inserted and the hip trialed. The best and most stable femoral construct was found with a 127 degrees neck angle and a +10 femoral head component. This allowed for excellent range of motion and stability without any additional longitudinal shuck. All trial components were then removed. The hip joint was copiously irrigated. The actual X3 0 degree acetabular liner was then inserted by malleting it into place. Its stability was checked using a tonsil clamp. The attention was then turned to insertion of the femoral component.   The size 3 Accolade TMZF stem was then [facility] into place. A size +10 36 mm femoral head was then malleted onto the trunnion and reduced. The hip again was taken through a full range of motion and deemed to be stable. The hip joint was then copiously irrigated. The T-shaped capsulotomy was repaired using a #2 Ti-Cron in interrupted suture fashion. The piriformis was repaired in a soft tissue repair to the abductor tendon. The hip joint was then copiously irrigated. The fascia lata was closed with interrupted 0 Vicryl. The wound was again copiously irrigated with pulse lavage. The subcutaneous tissue was then closed in two layers, the deep layer with 0 Vicryl and the superficial layer with a 2-0 Vicryl. The skin was approximated with a running 3-0 Monocryl. Steri-Strips applied along with a dry  sterile dressing.   Patient was then placed supine on the operative table. His leg lengths were equivalent. A hip abduction pillow was placed between his legs and he was then transferred to a hospital bed in stable condition.   I spoke with the patient's family member in the postoperative consultation room. I let her know that the patient was stable in the recovery room and that the procedure had gone without complication. I was scrubbed and present for  the entire case and all sharp and instrument counts were correct at the conclusion of the case.   ____________________________ Timoteo Gaul, MD klk:cms D: 09/05/2012 15:54:59 ET T: 09/06/2012 09:35:18 ET JOB#: [id number]  cc: Timoteo Gaul, MD, <Dictator> Timoteo Gaul MD ELECTRONICALLY SIGNED 09/09/2012 17:20

## 2015-04-10 NOTE — H&P (Signed)
PATIENT NAME:  [name], [name] MR#:  [id number] DATE OF BIRTH:  Sep 30, 1935  DATE OF ADMISSION:  05/30/2014  PRIMARY CARE PHYSICIAN:  Dr. Valentino Saxon.   REFERRING PHYSICIAN:  Dr. Karma Greaser.  CHIEF COMPLAINT:  Chest pain and cough.   HISTORY OF PRESENT ILLNESS:  The patient is a 80 year old African American male with a past medical history of coronary artery disease status post 2 or 3 stents approximately 20 years ago, hypertension, COPD, prostate cancer status post radiation therapy, bipolar disorder, is presenting to the ER with a chief complaint of chest pain and shortness of breath.  The patient suddenly started having chest pressure in the middle of the chest three hours prior to his ER arrival.  It is associated with shortness of breath, nausea and dizziness.  The patient was also diaphoretic.  EMS gave him aspirin and nitro paste was attached to the anterior chest wall.  Subsequently, the patient's chest pain significantly improved.  The patient's EKG has revealed normal sinus rhythm at 71 beats per minute with T wave inversions in lead V6.  The patient has history of coronary artery disease and he had 2 or 3 stents placed approximately 20 years ago, but has been not followed up by any cardiologist in the past several years.  The patient is started on heparin drip for unstable angina.  Chest x-ray has revealed dense opacity, regarding which the patient is diagnosed with community acquired pneumonia and is started on IV Rocephin and Zithromax.  During my examination, the patient is resting comfortably.  Denies any complaints.  No family members at bedside.   PAST MEDICAL HISTORY:  Hypertension, hyperlipidemia, coronary artery disease status post two or three stent placement in the past approximately 20 years ago, COPD, bipolar disorder, depression, history of prostate cancer status post radiation therapy.   PAST SURGICAL HISTORY:  Coronary artery stent placement.  ALLERGIES:  No known drug  allergies.   PSYCHOSOCIAL HISTORY:  Lives at home, lives alone.  Smokes 1 pack a day.  Denies alcohol or illicit drug usage.   FAMILY HISTORY:  Hypertension runs in his family.   HOME MEDICATIONS:  Not on any current home medications.   REVIEW OF SYSTEMS:  CONSTITUTIONAL:  Denies any fever or fatigue.  EYES:  Denies blurry vision, double vision, glaucoma.  EARS, NOSE, THROAT:  Denies epistaxis, discharge, tinnitus.  RESPIRATORY:  Complaining of intermittent episodes of cough, has chronic history of COPD, comes in with shortness of breath.  CARDIOVASCULAR:  Complaining of chest pain, midsternal, associated with dizziness.  Denies any loss of consciousness.  No peripheral edema.  GASTROINTESTINAL:  Denies any abdominal pain, nausea, vomiting, diarrhea, hematemesis or melena.  GENITOURINARY:  No dysuria, hematuria, or urinary frequency.  ENDOCRINE:  Denies polyuria, nocturia, heat or cold intolerance.  MUSCULOSKELETAL:  Denies any arthritis, gout, back pain or shoulder pain.  PSYCHIATRIC:  Has bipolar disorder and depression.  Denies any insomnia.  HEMATOLOGY AND ONCOLOGY:  No anemia, easy bruising, bleeding.   PHYSICAL EXAMINATION: VITAL SIGNS:  Temperature 97.8, pulse 71, respirations 18, blood pressure 167/88, pulse ox 96%.  GENERAL APPEARANCE:  Not under acute distress.  Moderately built and obese.  HEENT:  Normocephalic, atraumatic.  Pupils are equally reactive to light and accommodation.  No scleral icterus.  No conjunctival injection.  No sinus tenderness.  No postnasal drip.  NECK:  Supple.  No JVD.  No thyromegaly.  Range of motion is intact.  LUNGS:  Clear to auscultation.  No accessory muscle usage.  No anterior chest wall tenderness on palpation.  CARDIOVASCULAR:  S1, S2 normal.  Regular rate and rhythm.  No murmur.  GASTROINTESTINAL:  Soft.  Bowel sounds are positive in all four quadrants.  Obese, nontender, nondistended.  No hepatosplenomegaly.  No masses felt. NEUROLOGIC:   Awake, alert, oriented x 3.  Cranial nerves II through XII are grossly intact.  Motor and sensory are intact.  Reflexes are 2+.  EXTREMITIES:  No edema.  No cyanosis.  No clubbing.  SKIN:  Warm to touch.  Normal turgor.  No rashes.  No lesions.  MUSCULOSKELETAL:  No joint effusion, tenderness, erythema.  PSYCHIATRIC:  Normal mood and affect.   LABORATORIES AND IMAGING STUDIES:  Troponin less than 0.02.  CBC is normal.  PT/INR are normal.  LFTs:  Total protein 6.9, albumin 3.2, bili total normal, alkaline phosphatase 130, AST and ALT are normal.  Chem-8:  Glucose 125, BUN and creatinine are normal.  Sodium 132, potassium 4.0, chloride and CO2 are normal.  GFR greater than 60.  Anion gap 4, serum osmolality is normal.  Calcium is 8.4.  Magnesium 1.8, lipase 218.  A 12-lead EKG has revealed normal sinus rhythm at 71 beats per minute, inverted T wave in lead V6.  Portable chest x-ray has revealed a dense opacity within the retrocardiac left lower lobe.  Atelectasis is favored.  No other acute cardiopulmonary abnormality.   ASSESSMENT AND PLAN:  A 80 year old African American male presenting to the Emergency Room with a chief complaint of three hour history of chest pressure in the middle of the chest associated with shortness of breath and intermittent episodes of cough, dizziness and nausea, will be admitted with the following assessment and plan.  1.  Unstable angina.  The patient is started on heparin drip.  We will implement acute coronary syndrome protocol with oxygen, nitroglycerin, aspirin, beta blocker and statin.  Intravenous fluids while the patient is nothing by mouth.  Cycle cardiac biomarkers.  We will obtain 2-D echocardiogram.  Cardiology consult is placed to Dr. Neoma Laming who is on call.  2.  Community acquired pneumonia.  Cultures are obtained and the patient is started on intravenous Zosyn and Zithromax.  3.  History of coronary artery disease status post stent placement 20 years ago.   The patient is not actively following with any cardiologist in the past several years.  Currently chest pain-free, not on any home medications.  We will start him on oxygen, nitroglycerin, aspirin, beta blocker and statin.  4.  Hypertension.  The patient is started on metoprolol 25 mg by mouth twice daily.  We will up-titrate it as needed basis.  5.  Hyperlipidemia.  Check fasting lipid panel and the patient will be on statin.  6.  History of prostate cancer, status post radiation therapy, currently under remission.  Follow up with primary care physician and he needs to be referred to oncologist as needed basis.    Diagnosis and plan of care was discussed in detail with the patient.   We will provide him gastrointestinal and deep vein thrombosis prophylaxis.   CODE STATUS:  HE IS FULL CODE.  Daughter is the medical power of attorney.   Total time spent on the admission is 50 minutes.    ____________________________ Nicholes Mango, MD ag:ea D: 05/30/2014 03:00:54 ET T: 05/30/2014 04:00:48 ET JOB#: [id number]  cc: Nicholes Mango, MD, <Dictator> Nicholes Mango MD ELECTRONICALLY SIGNED 06/07/2014 0:55

## 2015-04-10 NOTE — Consult Note (Signed)
PATIENT NAME:  [name], [name] MR#:  [id number] DATE OF BIRTH:  02-07-1935  CARDIOLOGY CONSULTATION   DATE OF CONSULTATION:  05/30/2014  CONSULTING PHYSICIAN:  Dionisio David, MD  INDICATION FOR CONSULTATION: Chest pain.   HISTORY OF PRESENT ILLNESS: This is a 80 year old African-American male with a past medical history of coronary artery disease, status post PCI and stenting 20 years ago, noncompliant with his medication, who presented to the hospital with chest pain. The chest pain was described as pressure-type associated with shortness of breath and diaphoresis. The patient underwent a nuclear stress test which showed ejection fraction of 39%, apical and inferior wall reversible defect. He still has shortness of breath and chest pain.   PAST MEDICAL HISTORY:  1. Hyperlipidemia.  2. Hypertension.  3. Coronary artery disease, status post PCI and stenting.  4. Bipolar disorder.   ALLERGIES: None.   SOCIAL HISTORY: He lives alone. Smokes 1 pack per day. No EtOH abuse.   FAMILY HISTORY: Positive for hypertension.   PHYSICAL EXAMINATION:  GENERAL: He is alert, oriented x3.  VITAL SIGNS: Blood pressure was 167/88, respirations 18, pulse 71, temperature 97.8, saturation 96.  NECK: Revealed no JVD.  LUNGS: Clear.  HEART: Regular rate and rhythm. Normal S1, S2. No audible murmur.  ABDOMEN: Soft, nontender. Positive bowel sounds.  EXTREMITIES: No pedal edema.  NEUROLOGIC: Appears to be intact.   DIAGNOSTIC STUDIES: EKG shows normal sinus rhythm, old anteroseptal wall MI, poor R wave progression. Cardiac enzymes were negative.   ASSESSMENT AND PLAN: Unstable angina with acute coronary syndrome type of picture, noncompliant with medication. Stress test shows inferior and apical wall reversible defects, moderate to severe left ventricular dysfunction with ejection fraction of 39%. The patient is [place] class III chest pain. Will advise cardiac catheterization and place him on aspirin and  Plavix, anticoagulation with heparin or Lovenox and set up cardiac catheterization.   Thank you very much for referral.   ____________________________ Dionisio David, MD sak:lb D: 05/30/2014 11:59:46 ET T: 05/30/2014 12:18:16 ET JOB#: [id number]  cc: Dionisio David, MD, <Dictator> Dionisio David MD ELECTRONICALLY SIGNED 07/01/2014 9:21

## 2015-04-10 NOTE — Discharge Summary (Signed)
PATIENT NAME:  [name], [name] MR#:  [id number] DATE OF BIRTH:  [birth year]  DATE OF ADMISSION:  05/30/2014 DATE OF DISCHARGE:  06/02/2014  ADMITTING PHYSICIAN: Dr. Margaretmary Eddy   DISCHARGING PHYSICIAN:  Dr. Elijio Miles   PROCEDURES: Cardiac stress test by Dr. Saralyn Pilar. Cardiac catheterization, June 15, Dr. Neoma Laming.   DISCHARGE DIAGNOSIS:  Unstable angina, atelectasis.   CONSULTATION: Cardiology, Dr. Neoma Laming.   DISCHARGE MEDICATIONS: Please refer to medical reconciliation.   HOSPITAL COURSE: This gentleman presented to the Emergency Room with central substernal chest pain and dyspnea.  Pain was relieved by nitroglycerin. His initial cardiac enzymes were negative and he was admitted to a monitored bed for an acute coronary syndrome protocol. Please see history and physical for details. The patient'[name] cardiac enzymes were subsequently negative for acute coronary syndrome. He underwent a cardiac stress test which revealed inferior ischemia with severe LV dysfunction. He subsequently underwent a cardiac catheterization performed by Dr. Humphrey Rolls which did not reveal any new obstructive lesions or did not require percutaneous coronary intervention. The patient became pain-free and remained so during the remainder of his stay and was discharged to home in satisfactory condition.   DISCHARGE INSTRUCTIONS: Diet: Low cholesterol.  Activity: No heavy lifting. To follow up with cardiology.  Follow up with Dr. Neoma Laming in 1-2 weeks.   Discharge process TIME SPENT: 35 minutes.    ____________________________ [name] Elijio Miles, MD sat:dd D: 06/21/2014 11:14:49 ET T: 06/21/2014 19:29:27 ET JOB#: [id number]  cc: Sheikh A. Elijio Miles, MD, <Dictator> Veverly Fells MD ELECTRONICALLY SIGNED 06/26/2014 13:38

## 2015-04-11 NOTE — Op Note (Signed)
PATIENT NAME:  [name], WRICE MR#:  [id number] DATE OF BIRTH:  [birth year]  DATE OF PROCEDURE:  02/20/2012  PREOPERATIVE DIAGNOSIS:  Penile prosthesis malfunction. Organic impotence.   POSTOPERATIVE DIAGNOSIS:  Penile prosthesis malfunction. Organic impotence.   PROCEDURE: Inflatable penile prosthesis replacement.   SURGEON: Edrick Oh, M.D.   ANESTHESIA: General endotracheal anesthesia.   INDICATIONS: The patient is a 80 year old African American gentleman who had a penile prosthesis placed after treatment for prostate cancer a number of years prior. He has developed difficulty with the device. 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 He has not been able to cycle the device properly. This is affecting his ability to utilize the device. He presents for penile prosthesis revision.   DESCRIPTION OF PROCEDURE: After informed consent was obtained, the patient was taken to the operating room and placed in the supine position on the operating table. He was prepped and draped in the standard fashion. The previous suprapubic incision was identified. The same incision was utilized. A significant amount of scar was encountered as the dissection progressed. The connector for the device was initially identified. Additional tubing was then dissected free. The segment going to the pump was identified. The segment first going to the left corpora was identified. The tissue was cleaned around the corpora. A small corporotomy incision was made utilizing electrocautery at the tubing insertion site. The prosthesis could be visualized more internal. A larger corporotomy was then performed. Gentle traction on the tubing with a Kelly underneath resulted in prompt removal of the left cylinder. Dissection was continued in a similar fashion to expose the right corpora. A small corporotomy was made at the tubing insertion site. The cylinder was identified. The corporotomy was increased in  size. This required additional enlargement due to a large aneurysm of the device. This was able to be removed utilizing a Kelly clamp and gentle pressure. The rear-tip extenders were also removed with the device. The tubing was clamped and cut. The remaining tubing was pulled free extending to the reservoir. The fascia was incised. The reservoir was noted to be fairly deep. It was subsequently removed. The entire device was noted to be very dark in color.   The reservoir pocket was increased in size utilizing digital manipulation. The entire area was irrigated with ample amounts of antibiotic irrigating solution. The new reservoir was placed into the enlarged pocket. Initially 100 mL of saline was instilled. Care was taken in positioning of the backflow device. There was slight kinking. The decision was made to place an additional 25 mL in the reservoir for a total of 125 mL. This straightened out the area of kinking. The fascia was then closed utilizing a running 0 Vicryl suture. The corporotomy sites were irrigated once again with ample amounts of antibiotic irrigating solution. Measuring was performed demonstrating 22 cm bilaterally. The decision was made to utilize the 22 cm device with no rear tip extenders. The device was prepped.  The left cylinder was first placed. The needle was attached to the suture utilizing the delivery device. The needle was placed through the glans penis. The cylinder was pulled into the distal corpora. The proximal corpora was then placed with good positioning noted. The right cylinder was similarly placed without difficulty. Good positioning was also noted and inflation of the device was undertaken. This demonstrated no kinking with good positioning of the cylinders. The cylinders were then deflated. A new pump pocket was made in the  right anterior scrotum. The previous was noted to be very posterior. The pump was placed into the pocket in good position. The tubing was then  trimmed to length in standard fashion. It was connected utilizing the supplied connector, also in standard fashion. The corporotomy incision sites were then closed bilaterally. The previously placed stay sutures were closed over the site. The wound was irrigated once more with ample amounts of antibiotic irrigating solution. A repeat cycle of the device demonstrated good function. It was partially deflated and locked in the partially deflated position. The suprapubic site was closed utilizing a plain gut suture. This was performed in two layers. The skin was then closed utilizing running 4-0 Vicryl subcuticular stitch. Steri-Strips, Telfa, and Tegaderm dressings were applied. A 16 French coude-tipped catheter that was placed at the beginning of the procedure was then removed. The patient was then awakened from general endotracheal anesthesia and was taken to the recovery room in stable condition. There were no problems or complications. The patient tolerated the procedure well.    ____________________________ Denice Bors. Jacqlyn Larsen, MD bsc:bjt D: 02/20/2012 13:04:53 ET T: 02/20/2012 15:12:39 ET JOB#: [id number]  cc: Denice Bors. Jacqlyn Larsen, MD, <Dictator> Denice Bors Marquiz Sotelo MD ELECTRONICALLY SIGNED 02/27/2012 9:51

## 2015-04-11 NOTE — Op Note (Signed)
PATIENT NAME:  [name], [name] MR#:  [id number] DATE OF BIRTH:  1935/04/09  DATE OF PROCEDURE:  03/21/2012  PREOPERATIVE DIAGNOSIS: Infected penile prosthesis.   POSTOPERATIVE DIAGNOSIS: Infected penile prosthesis.   PROCEDURE: Inflatable penile prosthesis removal.   SURGEON: Edrick Oh, M.D.   ANESTHESIA: General endotracheal anesthesia.   INDICATIONS: The patient is a 80 year old African American gentleman with a history of prostate cancer. He had undergone penile prosthesis placement many years prior. He developed an aneurysmal dilation and malfunction of the previous device. He underwent recent revision and replacement of the penile prosthesis. He was doing well with good healing until approximately one week prior to presentation; he developed opening in the suprapubic incision site. The tubing was noted protruding through the skin. He pushed the tubing back under the skin multiple times over the Easter weekend. He contacted [place] earlier this week with the situation. Some clear drainage was noted around the site. No definitive purulence was appreciated. On examination tubing was present at the level of the skin. Given the longstanding duration of exposure, the likelihood of significant salvage was minimal. The decision was made therefore to proceed with prosthesis removal.   DESCRIPTION OF PROCEDURE: After informed consent was obtained, the patient was taken to the operating room and placed in the supine on the operating table. He was then prepped and draped in the usual standard fashion. The previous incision site was opened with an elliptical incision made around the open portion of the incision. The scar was excised. The incision was continued down into the pocket. No outright abscess was identified. Significant thickening of the skin, however, was present. The tubing was first followed to the reservoir site. The previous incision was easily opened. The tubing was cut at the connector.  The deflation  device near the reservoir was pressed, draining the reservoir. The reservoir was then removed without difficulty. The area was closed utilizing 2-0 Vicryl suture. The tubing was then followed down first to the left corpora. The corporal site was opened. The cylinder was removed without difficulty in its entirety. The right cylinder was removed in a similar fashion. Pressure was then applied to the tubing. The pump was removed, incising the scar that had developed around the pump. The area was then irrigated with ample amounts of antibiotic irrigating solution. The corpora were loosely reapproximated utilizing 2-0 Vicryl suture. The suprapubic site was closed in several layers utilizing plain gut suture. The skin was closed utilizing a 4-0 Vicryl subcuticular stitch. It was not felt that a drain was needed given the situation. Steri-Strips, Telfa, and Tegaderm dressing was then applied. The patient was awakened from general endotracheal anesthesia and was taken to the recovery room in stable condition. There were no problems or complications. The patient tolerated the procedure well.     ____________________________ Denice Bors. Jacqlyn Larsen, MD bsc:bjt D: 03/22/2012 14:40:05 ET T: 03/22/2012 18:01:22 ET JOB#: [id number]  cc: Denice Bors. Jacqlyn Larsen, MD, <Dictator> Denice Bors Elea Holtzclaw MD ELECTRONICALLY SIGNED 03/26/2012 7:10

## 2015-12-03 ENCOUNTER — Ambulatory Visit (INDEPENDENT_AMBULATORY_CARE_PROVIDER_SITE_OTHER): Payer: Medicare Other | Admitting: Cardiovascular Disease

## 2015-12-03 ENCOUNTER — Encounter (INDEPENDENT_AMBULATORY_CARE_PROVIDER_SITE_OTHER): Payer: Self-pay

## 2015-12-03 ENCOUNTER — Encounter: Payer: Self-pay | Admitting: Cardiovascular Disease

## 2015-12-03 VITALS — BP 220/115 | HR 68 | Ht 67.0 in | Wt 310.0 lb

## 2015-12-03 DIAGNOSIS — I159 Secondary hypertension, unspecified: Secondary | ICD-10-CM

## 2015-12-03 DIAGNOSIS — R079 Chest pain, unspecified: Secondary | ICD-10-CM

## 2015-12-03 DIAGNOSIS — E785 Hyperlipidemia, unspecified: Secondary | ICD-10-CM | POA: Insufficient documentation

## 2015-12-03 DIAGNOSIS — I25118 Atherosclerotic heart disease of native coronary artery with other forms of angina pectoris: Secondary | ICD-10-CM | POA: Diagnosis not present

## 2015-12-03 DIAGNOSIS — I10 Essential (primary) hypertension: Secondary | ICD-10-CM

## 2015-12-03 MED ORDER — CHLORTHALIDONE 25 MG PO TABS: 25.0000 mg | ORAL_TABLET | Freq: Every day | ORAL | Status: DC

## 2015-12-03 NOTE — Progress Notes (Signed)
Primary care physician: Dr. [name].  HPI  This is a 80 year old African-American male who is here today for evaluation of hypertension and coronary artery disease. He had cardiac catheterization done in 2005 which showed single-vessel coronary artery disease involving the distal right coronary artery. He had an angioplasty and Taxus drug-eluting stent placement. No cardiac events since then. He has extensive medical problems that include borderline diabetes, hypertension, hyperlipidemia, morbid obesity, previous tobacco use, bipolar disorder and peripheral arterial disease. He has been noncompliant with blood pressure medications. He reports that all medications that have been tried caused him severe constipation and thus he stopped. It appears that he was before on amlodipine as well as lisinopril-hydrochlorothiazide. He reports frequent episodes of brief substernal chest tightness at rest and not with physical activities. He has dyspnea with minimal activities. He continues to gain weight and has gained 10 pounds in the last few weeks. His diet continues to be poor and he does not adhere to low-sodium diet. He has family history of hypertension and coronary artery disease.  Allergies  Allergen Reactions  . Amlodipine     Constipation   . Benazepril-Hydrochlorothiazide     Constipation      No current outpatient prescriptions on file prior to visit.   No current facility-administered medications on file prior to visit.     Past Medical History  Diagnosis Date  . COPD (chronic obstructive pulmonary disease) (HCC)     COPD  . Prostate CA [facility])     prostate ca dx 36 ys ago;  . Prostate CA [facility])     prostate ca dx 20 yrs ago  . Hypertension   . Hyperlipidemia   . Alcoholism ([place])   . PVD (peripheral vascular disease) ([place])   . Degenerative joint disease   . Bipolar affective disorder ([place])   . Tobacco abuse   . Coronary artery disease February 2005    PCI and Taxus drug-eluting  stent placement to the distal RCA (3.5 x 12 mm)  . Essential hypertension      Past Surgical History  Procedure Laterality Date  . Total hip arthroplasty      right  . Cardiac catheterization    . Coronary angioplasty with stent placement       Family History  Problem Relation Age of Onset  . Hypertension Mother   . Hyperlipidemia Mother   . Heart attack Mother   . Hypertension Father   . Hyperlipidemia Father   . Heart attack Father      Social History   Social History  . Marital Status: Legally Separated    Spouse Name: N/A  . Number of Children: N/A  . Years of Education: N/A   Occupational History  . Not on file.   Social History Main Topics  . Smoking status: Former Smoker -- 1.00 packs/day for 50 years    Types: Cigarettes  . Smokeless tobacco: Not on file  . Alcohol Use: Yes     Comment: social   . Drug Use: No  . Sexual Activity: Not on file   Other Topics Concern  . Not on file   Social History Narrative     ROS A 10 point review of system was performed. It is negative other than that mentioned in the history of present illness.   PHYSICAL EXAM   BP 220/115 mmHg  Pulse 68  Ht 5\' 7"  (1.702 m)  Wt 310 lb (140.615 kg)  BMI 48.54 kg/m2 Constitutional: He is oriented to  person, place, and time. He appears well-developed and well-nourished. No distress.  HENT: No nasal discharge.  Head: Normocephalic and atraumatic.  Eyes: Pupils are equal and round.  No discharge. Neck: Normal range of motion. Neck supple. No JVD present. No thyromegaly present.  Cardiovascular: Normal rate, regular rhythm, normal heart sounds. Exam reveals no gallop and no friction rub. No murmur heard.  Pulmonary/Chest: Effort normal and breath sounds normal. No stridor. No respiratory distress. He has no wheezes. He has no rales. He exhibits no tenderness.  Abdominal: Soft. Bowel sounds are normal. He exhibits no distension. There is no tenderness. There is no rebound and  no guarding.  Musculoskeletal: Normal range of motion. He exhibits no edema and no tenderness.  Neurological: He is alert and oriented to person, place, and time. Coordination normal.  Skin: Skin is warm and dry. No rash noted. He is not diaphoretic. No erythema. No pallor.  Psychiatric: He has a normal mood and affect. His behavior is normal. Judgment and thought content normal.       EKG: Normal sinus rhythm with borderline criteria for LVH. Nonspecific T wave changes.   ASSESSMENT AND PLAN

## 2015-12-03 NOTE — Patient Instructions (Addendum)
Medication Instructions:  Your physician has recommended you make the following change in your medication:  START taking chlorthalidone 25mg  once per day   Labwork: BMET and BP check in one week  Testing/Procedures: none  Follow-Up: Your physician recommends that you schedule a follow-up appointment in: six weeks with Dr. Fletcher Anon.    Any Other Special Instructions Will Be Listed Below (If Applicable).  Low-Sodium Eating Plan Sodium raises blood pressure and causes water to be held in the body. Getting less sodium from food will help lower your blood pressure, reduce any swelling, and protect your heart, liver, and kidneys. We get sodium by adding salt (sodium chloride) to food. Most of our sodium comes from canned, boxed, and frozen foods. Restaurant foods, fast foods, and pizza are also very high in sodium. Even if you take medicine to lower your blood pressure or to reduce fluid in your body, getting less sodium from your food is important. WHAT IS MY PLAN? Most people should limit their sodium intake to 2,300 mg a day. Your health care provider recommends that you limit your sodium intake to __________ a day.  WHAT DO I NEED TO KNOW ABOUT THIS EATING PLAN? For the low-sodium eating plan, you will follow these general guidelines:  Choose foods with a % Daily Value for sodium of less than 5% (as listed on the food label).   Use salt-free seasonings or herbs instead of table salt or sea salt.   Check with your health care provider or pharmacist before using salt substitutes.   Eat fresh foods.  Eat more vegetables and fruits.  Limit canned vegetables. If you do use them, rinse them well to decrease the sodium.   Limit cheese to 1 oz (28 g) per day.   Eat lower-sodium products, often labeled as "lower sodium" or "no salt added."  Avoid foods that contain monosodium glutamate (MSG). MSG is sometimes added to [place] food and some canned foods.  Check food labels  (Nutrition Facts labels) on foods to learn how much sodium is in one serving.  Eat more home-cooked food and less restaurant, buffet, and fast food.  When eating at a restaurant, ask that your food be prepared with less salt, or no salt if possible.  HOW DO I READ FOOD LABELS FOR SODIUM INFORMATION? The Nutrition Facts label lists the amount of sodium in one serving of the food. If you eat more than one serving, you must multiply the listed amount of sodium by the number of servings. Food labels may also identify foods as:  Sodium free--Less than 5 mg in a serving.  Very low sodium--35 mg or less in a serving.  Low sodium--140 mg or less in a serving.  Light in sodium--50% less sodium in a serving. For example, if a food that usually has 300 mg of sodium is changed to become light in sodium, it will have 150 mg of sodium.  Reduced sodium--25% less sodium in a serving. For example, if a food that usually has 400 mg of sodium is changed to reduced sodium, it will have 300 mg of sodium. WHAT FOODS CAN I EAT? Grains Low-sodium cereals, including oats, puffed wheat and rice, and shredded wheat cereals. Low-sodium crackers. Unsalted rice and pasta. Lower-sodium bread.  Vegetables Frozen or fresh vegetables. Low-sodium or reduced-sodium canned vegetables. Low-sodium or reduced-sodium tomato sauce and paste. Low-sodium or reduced-sodium tomato and vegetable juices.  Fruits Fresh, frozen, and canned fruit. Fruit juice.  Meat and Other Protein Products Low-sodium canned  tuna and salmon. Fresh or frozen meat, poultry, seafood, and fish. Lamb. Unsalted nuts. Dried beans, peas, and lentils without added salt. Unsalted canned beans. Homemade soups without salt. Eggs.  Dairy Milk. Soy milk. Ricotta cheese. Low-sodium or reduced-sodium cheeses. Yogurt.  Condiments Fresh and dried herbs and spices. Salt-free seasonings. Onion and garlic powders. Low-sodium varieties of mustard and ketchup. Fresh  or refrigerated horseradish. Lemon juice.  Fats and Oils Reduced-sodium salad dressings. Unsalted butter.  Other Unsalted popcorn and pretzels.  The items listed above may not be a complete list of recommended foods or beverages. Contact your dietitian for more options. WHAT FOODS ARE NOT RECOMMENDED? Grains Instant hot cereals. Bread stuffing, pancake, and biscuit mixes. Croutons. Seasoned rice or pasta mixes. Noodle soup cups. Boxed or frozen macaroni and cheese. Self-rising flour. Regular salted crackers. Vegetables Regular canned vegetables. Regular canned tomato sauce and paste. Regular tomato and vegetable juices. Frozen vegetables in sauces. Salted [place] fries. Olives. Angie Fava. Relishes. Sauerkraut. Salsa. Meat and Other Protein Products Salted, canned, smoked, spiced, or pickled meats, seafood, or fish. Bacon, ham, sausage, hot dogs, corned beef, chipped beef, and packaged luncheon meats. Salt pork. Jerky. Pickled herring. Anchovies, regular canned tuna, and sardines. Salted nuts. Dairy Processed cheese and cheese spreads. Cheese curds. Blue cheese and cottage cheese. Buttermilk.  Condiments Onion and garlic salt, seasoned salt, table salt, and sea salt. Canned and packaged gravies. Worcestershire sauce. Tartar sauce. Barbecue sauce. Teriyaki sauce. Soy sauce, including reduced sodium. Steak sauce. Fish sauce. Oyster sauce. Cocktail sauce. Horseradish that you find on the shelf. Regular ketchup and mustard. Meat flavorings and tenderizers. Bouillon cubes. Hot sauce. Tabasco sauce. Marinades. Taco seasonings. Relishes. Fats and Oils Regular salad dressings. Salted butter. Margarine. Ghee. Bacon fat.  Other Potato and tortilla chips. Corn chips and puffs. Salted popcorn and pretzels. Canned or dried soups. Pizza. Frozen entrees and pot pies.  The items listed above may not be a complete list of foods and beverages to avoid. Contact your dietitian for more information.    This information is not intended to replace advice given to you by your health care provider. Make sure you discuss any questions you have with your health care provider.   Document Released: 05/26/2002 Document Revised: 12/25/2014 Document Reviewed: 10/08/2013 Elsevier Interactive Patient Education [employer].    If you need a refill on your cardiac medications before your next appointment, please call your pharmacy.  Low-Sodium Eating Plan Sodium raises blood pressure and causes water to be held in the body. Getting less sodium from food will help lower your blood pressure, reduce any swelling, and protect your heart, liver, and kidneys. We get sodium by adding salt (sodium chloride) to food. Most of our sodium comes from canned, boxed, and frozen foods. Restaurant foods, fast foods, and pizza are also very high in sodium. Even if you take medicine to lower your blood pressure or to reduce fluid in your body, getting less sodium from your food is important. WHAT IS MY PLAN? Most people should limit their sodium intake to 2,300 mg a day. Your health care provider recommends that you limit your sodium intake to __________ a day.  WHAT DO I NEED TO KNOW ABOUT THIS EATING PLAN? For the low-sodium eating plan, you will follow these general guidelines:  Choose foods with a % Daily Value for sodium of less than 5% (as listed on the food label).   Use salt-free seasonings or herbs instead of table salt or sea salt.  Check with your health care provider or pharmacist before using salt substitutes.   Eat fresh foods.  Eat more vegetables and fruits.  Limit canned vegetables. If you do use them, rinse them well to decrease the sodium.   Limit cheese to 1 oz (28 g) per day.   Eat lower-sodium products, often labeled as "lower sodium" or "no salt added."  Avoid foods that contain monosodium glutamate (MSG). MSG is sometimes added to [place] food and some canned foods.  Check  food labels (Nutrition Facts labels) on foods to learn how much sodium is in one serving.  Eat more home-cooked food and less restaurant, buffet, and fast food.  When eating at a restaurant, ask that your food be prepared with less salt, or no salt if possible.  HOW DO I READ FOOD LABELS FOR SODIUM INFORMATION? The Nutrition Facts label lists the amount of sodium in one serving of the food. If you eat more than one serving, you must multiply the listed amount of sodium by the number of servings. Food labels may also identify foods as:  Sodium free--Less than 5 mg in a serving.  Very low sodium--35 mg or less in a serving.  Low sodium--140 mg or less in a serving.  Light in sodium--50% less sodium in a serving. For example, if a food that usually has 300 mg of sodium is changed to become light in sodium, it will have 150 mg of sodium.  Reduced sodium--25% less sodium in a serving. For example, if a food that usually has 400 mg of sodium is changed to reduced sodium, it will have 300 mg of sodium. WHAT FOODS CAN I EAT? Grains Low-sodium cereals, including oats, puffed wheat and rice, and shredded wheat cereals. Low-sodium crackers. Unsalted rice and pasta. Lower-sodium bread.  Vegetables Frozen or fresh vegetables. Low-sodium or reduced-sodium canned vegetables. Low-sodium or reduced-sodium tomato sauce and paste. Low-sodium or reduced-sodium tomato and vegetable juices.  Fruits Fresh, frozen, and canned fruit. Fruit juice.  Meat and Other Protein Products Low-sodium canned tuna and salmon. Fresh or frozen meat, poultry, seafood, and fish. Lamb. Unsalted nuts. Dried beans, peas, and lentils without added salt. Unsalted canned beans. Homemade soups without salt. Eggs.  Dairy Milk. Soy milk. Ricotta cheese. Low-sodium or reduced-sodium cheeses. Yogurt.  Condiments Fresh and dried herbs and spices. Salt-free seasonings. Onion and garlic powders. Low-sodium varieties of mustard and  ketchup. Fresh or refrigerated horseradish. Lemon juice.  Fats and Oils Reduced-sodium salad dressings. Unsalted butter.  Other Unsalted popcorn and pretzels.  The items listed above may not be a complete list of recommended foods or beverages. Contact your dietitian for more options. WHAT FOODS ARE NOT RECOMMENDED? Grains Instant hot cereals. Bread stuffing, pancake, and biscuit mixes. Croutons. Seasoned rice or pasta mixes. Noodle soup cups. Boxed or frozen macaroni and cheese. Self-rising flour. Regular salted crackers. Vegetables Regular canned vegetables. Regular canned tomato sauce and paste. Regular tomato and vegetable juices. Frozen vegetables in sauces. Salted [place] fries. Olives. Angie Fava. Relishes. Sauerkraut. Salsa. Meat and Other Protein Products Salted, canned, smoked, spiced, or pickled meats, seafood, or fish. Bacon, ham, sausage, hot dogs, corned beef, chipped beef, and packaged luncheon meats. Salt pork. Jerky. Pickled herring. Anchovies, regular canned tuna, and sardines. Salted nuts. Dairy Processed cheese and cheese spreads. Cheese curds. Blue cheese and cottage cheese. Buttermilk.  Condiments Onion and garlic salt, seasoned salt, table salt, and sea salt. Canned and packaged gravies. Worcestershire sauce. Tartar sauce. Barbecue sauce. Teriyaki sauce. Soy sauce, including  reduced sodium. Steak sauce. Fish sauce. Oyster sauce. Cocktail sauce. Horseradish that you find on the shelf. Regular ketchup and mustard. Meat flavorings and tenderizers. Bouillon cubes. Hot sauce. Tabasco sauce. Marinades. Taco seasonings. Relishes. Fats and Oils Regular salad dressings. Salted butter. Margarine. Ghee. Bacon fat.  Other Potato and tortilla chips. Corn chips and puffs. Salted popcorn and pretzels. Canned or dried soups. Pizza. Frozen entrees and pot pies.  The items listed above may not be a complete list of foods and beverages to avoid. Contact your dietitian for more  information.   This information is not intended to replace advice given to you by your health care provider. Make sure you discuss any questions you have with your health care provider.   Document Released: 05/26/2002 Document Revised: 12/25/2014 Document Reviewed: 10/08/2013 Elsevier Interactive Patient Education [employer].

## 2015-12-03 NOTE — Assessment & Plan Note (Signed)
There is an indication for treatment with a statin given the presence of coronary artery disease. The patient seems to be hesitant about multiple medications. We'll focus now on controlling his blood pressure and then will address this.

## 2015-12-03 NOTE — Assessment & Plan Note (Signed)
The patient has known history of coronary artery disease with previous drug-eluting stent placement to the distal right coronary artery. Continue low-dose aspirin. He is having intermittent episodes of atypical chest pain which could be related to elevated blood pressure. Once his blood pressure is controlled, we might need to consider further ischemic evaluation if he continues to have symptoms.

## 2015-12-03 NOTE — Assessment & Plan Note (Signed)
The patient's blood pressure is severely elevated. I had a prolonged discussion with the patient and his daughter about the priority of controlling his blood pressure. It's probably best to start with one blood pressure medication at that time so that we can differentiate the cause of his side effects. I elected to start treatment with chlorthalidone 25 mg once daily. Will bring him back in one week for basic metabolic profile and blood pressure . We provided him with written instructions about low sodium diet. We can then consider adding an ARB or low-dose carvedilol. Avoid calcium channel blockers due to previous constipation.

## 2015-12-10 ENCOUNTER — Other Ambulatory Visit (INDEPENDENT_AMBULATORY_CARE_PROVIDER_SITE_OTHER): Payer: Medicare Other

## 2015-12-10 ENCOUNTER — Ambulatory Visit (INDEPENDENT_AMBULATORY_CARE_PROVIDER_SITE_OTHER): Payer: Medicare Other

## 2015-12-10 VITALS — BP 137/77 | HR 76 | Resp 16 | Ht 67.0 in | Wt 305.4 lb

## 2015-12-10 DIAGNOSIS — I159 Secondary hypertension, unspecified: Secondary | ICD-10-CM | POA: Diagnosis not present

## 2015-12-10 DIAGNOSIS — I10 Essential (primary) hypertension: Secondary | ICD-10-CM

## 2015-12-10 NOTE — Patient Instructions (Signed)
1.) Reason for visit: BP check  2.) Name of MD requesting visit: Arida  3)  H&P: Pt has history of HTN and CAD at 12/16 OV, BP 220/115. Per notes, he has been non-compliant w/BP meds stating they caused him constipation.   4): ROS related to problem: Chlorthalidone 25mg  qd was added Dec 16 w/instructions for labs and f/u BP check today. Pt states he has taken as instructed, has been eating better and following a low sodium diet. BP today 137/77 and pt has lost 5 lbs.    Assessment and plan per MD: Forward to MD to make aware of improved BP

## 2015-12-11 LAB — BASIC METABOLIC PANEL
BUN / CREAT RATIO: 17 (ref 10–22)
BUN: 19 mg/dL (ref 8–27)
CHLORIDE: 82 mmol/L — AB (ref 96–106)
CO2: 25 mmol/L (ref 18–29)
Calcium: 8.3 mg/dL — ABNORMAL LOW (ref 8.6–10.2)
Creatinine, Ser: 1.14 mg/dL (ref 0.76–1.27)
GFR calc Af Amer: 71 mL/min/{1.73_m2} (ref >59)
GFR calc non Af Amer: 61 mL/min/{1.73_m2} (ref >59)
GLUCOSE: 92 mg/dL (ref 65–99)
Potassium: 3.7 mmol/L (ref 3.5–5.2)
Sodium: 124 mmol/L — ABNORMAL LOW (ref 134–144)

## 2015-12-14 ENCOUNTER — Other Ambulatory Visit: Payer: Self-pay

## 2015-12-14 DIAGNOSIS — I159 Secondary hypertension, unspecified: Secondary | ICD-10-CM

## 2015-12-14 MED ORDER — LOSARTAN POTASSIUM 100 MG PO TABS: 100.0000 mg | ORAL_TABLET | Freq: Every day | ORAL | Status: DC

## 2015-12-15 ENCOUNTER — Telehealth: Payer: Self-pay | Admitting: *Deleted

## 2015-12-15 NOTE — Telephone Encounter (Signed)
Pt c/o medication issue:  1. Name of Medication: Chlorthalidone 25mg    2. How are you currently taking this medication (dosage and times per day)? Once a day  3. Are you having a reaction (difficulty breathing--STAT)? no  4. What is your medication issue? Causing chronic constipation.

## 2015-12-15 NOTE — Telephone Encounter (Signed)
S/w pt daugther, [place], as I had left VM regarding lab results. See lab results note.

## 2015-12-23 ENCOUNTER — Other Ambulatory Visit (INDEPENDENT_AMBULATORY_CARE_PROVIDER_SITE_OTHER): Payer: Medicare Other

## 2015-12-23 DIAGNOSIS — I159 Secondary hypertension, unspecified: Secondary | ICD-10-CM | POA: Diagnosis not present

## 2015-12-24 ENCOUNTER — Other Ambulatory Visit: Payer: Medicare Other

## 2015-12-24 LAB — BASIC METABOLIC PANEL
BUN/Creatinine Ratio: 13 (ref 10–22)
BUN: 15 mg/dL (ref 8–27)
CALCIUM: 8.5 mg/dL — AB (ref 8.6–10.2)
CHLORIDE: 99 mmol/L (ref 96–106)
CO2: 17 mmol/L — AB (ref 18–29)
Creatinine, Ser: 1.13 mg/dL (ref 0.76–1.27)
GFR calc non Af Amer: 62 mL/min/{1.73_m2} (ref >59)
GFR, EST AFRICAN AMERICAN: 72 mL/min/{1.73_m2} (ref >59)
GLUCOSE: 87 mg/dL (ref 65–99)
POTASSIUM: 4.8 mmol/L (ref 3.5–5.2)
Sodium: 137 mmol/L (ref 134–144)

## 2016-01-01 ENCOUNTER — Emergency Department: Payer: Medicare Other

## 2016-01-01 ENCOUNTER — Encounter: Payer: Self-pay | Admitting: Emergency Medicine

## 2016-01-01 ENCOUNTER — Emergency Department
Admission: EM | Admit: 2016-01-01 | Discharge: 2016-01-01 | Disposition: A | Discharge status: Home / Self Care | Payer: Medicare Other | Attending: Emergency Medicine | Admitting: Emergency Medicine

## 2016-01-01 DIAGNOSIS — I700 Atherosclerosis of aorta: Secondary | ICD-10-CM | POA: Diagnosis not present

## 2016-01-01 DIAGNOSIS — Z79899 Other long term (current) drug therapy: Secondary | ICD-10-CM | POA: Diagnosis not present

## 2016-01-01 DIAGNOSIS — Z87891 Personal history of nicotine dependence: Secondary | ICD-10-CM | POA: Diagnosis not present

## 2016-01-01 DIAGNOSIS — I10 Essential (primary) hypertension: Secondary | ICD-10-CM | POA: Diagnosis not present

## 2016-01-01 DIAGNOSIS — N281 Cyst of kidney, acquired: Secondary | ICD-10-CM | POA: Diagnosis not present

## 2016-01-01 DIAGNOSIS — Z7982 Long term (current) use of aspirin: Secondary | ICD-10-CM | POA: Insufficient documentation

## 2016-01-01 DIAGNOSIS — R109 Unspecified abdominal pain: Secondary | ICD-10-CM | POA: Diagnosis present

## 2016-01-01 DIAGNOSIS — M2540 Effusion, unspecified joint: Secondary | ICD-10-CM | POA: Insufficient documentation

## 2016-01-01 HISTORY — DX: Unspecified asthma, uncomplicated: J45.909

## 2016-01-01 LAB — CBC WITH DIFFERENTIAL/PLATELET
Basophils Absolute: 0 10*3/uL (ref 0–0.1)
Basophils Relative: 0 %
Eosinophils Absolute: 0.1 10*3/uL (ref 0–0.7)
Eosinophils Relative: 2 %
HCT: 38.1 % — ABNORMAL LOW (ref 40.0–52.0)
HEMOGLOBIN: 13 g/dL (ref 13.0–18.0)
LYMPHS PCT: 21 %
Lymphs Abs: 1.5 10*3/uL (ref 1.0–3.6)
MCH: 29.3 pg (ref 26.0–34.0)
MCHC: 34.1 g/dL (ref 32.0–36.0)
MCV: 86 fL (ref 80.0–100.0)
MONO ABS: 0.7 10*3/uL (ref 0.2–1.0)
Monocytes Relative: 9 %
Neutro Abs: 4.9 10*3/uL (ref 1.4–6.5)
Neutrophils Relative %: 68 %
Platelets: 204 10*3/uL (ref 150–440)
RBC: 4.43 MIL/uL (ref 4.40–5.90)
RDW: 14.3 % (ref 11.5–14.5)
WBC: 7.2 10*3/uL (ref 3.8–10.6)

## 2016-01-01 LAB — COMPREHENSIVE METABOLIC PANEL
ALBUMIN: 2.8 g/dL — AB (ref 3.5–5.0)
ALK PHOS: 70 U/L (ref 38–126)
ALT: 29 U/L (ref 17–63)
AST: 63 U/L — AB (ref 15–41)
Anion gap: 11 (ref 5–15)
BILIRUBIN TOTAL: 1.1 mg/dL (ref 0.3–1.2)
BUN: 17 mg/dL (ref 6–20)
CO2: 25 mmol/L (ref 22–32)
CREATININE: 1.15 mg/dL (ref 0.61–1.24)
Calcium: 8.3 mg/dL — ABNORMAL LOW (ref 8.9–10.3)
Chloride: 96 mmol/L — ABNORMAL LOW (ref 101–111)
GFR calc Af Amer: >60 mL/min (ref >60)
GFR, EST NON AFRICAN AMERICAN: 59 mL/min — AB (ref >60)
GLUCOSE: 88 mg/dL (ref 65–99)
Potassium: 4.2 mmol/L (ref 3.5–5.1)
Sodium: 132 mmol/L — ABNORMAL LOW (ref 135–145)
TOTAL PROTEIN: 6.3 g/dL — AB (ref 6.5–8.1)

## 2016-01-01 LAB — URINALYSIS COMPLETE WITH MICROSCOPIC (ARMC ONLY)
Bilirubin Urine: NEGATIVE
Glucose, UA: NEGATIVE mg/dL
Ketones, ur: NEGATIVE mg/dL
LEUKOCYTES UA: NEGATIVE
Nitrite: NEGATIVE
PH: 6 (ref 5.0–8.0)
PROTEIN: 100 mg/dL — AB
Specific Gravity, Urine: 1.003 — ABNORMAL LOW (ref 1.005–1.030)

## 2016-01-01 LAB — TROPONIN I: Troponin I: <0.03 ng/mL (ref <0.031)

## 2016-01-01 LAB — LIPASE, BLOOD: Lipase: 33 U/L (ref 11–51)

## 2016-01-01 MED ORDER — ONDANSETRON 4 MG PO TBDP
ORAL_TABLET | ORAL | Status: AC
  Filled 2016-01-01: qty 1

## 2016-01-01 MED ORDER — ONDANSETRON 4 MG PO TBDP
4.0000 mg | ORAL_TABLET | Freq: Once | ORAL | Status: AC
  Administered 2016-01-01: 4 mg via ORAL

## 2016-01-01 MED ORDER — IOHEXOL 350 MG/ML SOLN
125.0000 mL | Freq: Once | INTRAVENOUS | Status: AC | PRN
  Administered 2016-01-01: 125 mL via INTRAVENOUS

## 2016-01-01 MED ORDER — MORPHINE SULFATE (PF) 4 MG/ML IV SOLN
4.0000 mg | Freq: Once | INTRAVENOUS | Status: AC
  Administered 2016-01-01: 4 mg via INTRAVENOUS
  Filled 2016-01-01: qty 1

## 2016-01-01 MED ORDER — ONDANSETRON 4 MG PO TBDP: 4.0000 mg | ORAL_TABLET | Freq: Four times a day (QID) | ORAL | Status: DC | PRN

## 2016-01-01 MED ORDER — ONDANSETRON HCL 4 MG/2ML IJ SOLN
4.0000 mg | Freq: Once | INTRAMUSCULAR | Status: AC
  Administered 2016-01-01: 4 mg via INTRAVENOUS
  Filled 2016-01-01: qty 2

## 2016-01-01 NOTE — ED Provider Notes (Addendum)
[facility] Emergency Department Provider Note  ____________________________________________  Time seen: Approximately 6:52 PM  I have reviewed the triage vital signs and the nursing notes.   HISTORY  Chief Complaint Flank Pain    HPI Amair Quist. is a 81 y.o. male history of COPD, hypertension hyperlipidemia, previous alcoholism and coronary disease.  The patient is a 62 having pain over the left flank, similar to what he had a few months ago which is diagnosis cysts on the kidney. Reports that he was taking Aleve and this provided good relief, but still has ongoing mild ache. He also reports that for the last few days whenever he eats he develops worsening of the pain, it feels crampy, and occasionally has loose nonbloody stool. At the present time his symptoms are mild but reports that when he will eat the symptoms seem to get worse thereafter primarily located over the left mid to lower abdomen.   Past Medical History  Diagnosis Date  . COPD (chronic obstructive pulmonary disease) (HCC)     COPD  . Prostate CA [facility])     prostate ca dx 73 ys ago;  . Prostate CA [facility])     prostate ca dx 20 yrs ago  . Hypertension   . Hyperlipidemia   . Alcoholism ([place])   . PVD (peripheral vascular disease) ([place])   . Degenerative joint disease   . Bipolar affective disorder ([place])   . Tobacco abuse   . Coronary artery disease February 2005    PCI and Taxus drug-eluting stent placement to the distal RCA (3.5 x 12 mm)  . Essential hypertension   . Asthma     Patient Active Problem List   Diagnosis Date Noted  . Hyperlipidemia 12/03/2015  . Essential hypertension   . Coronary artery disease 01/19/2004    Past Surgical History  Procedure Laterality Date  . Total hip arthroplasty      right  . Cardiac catheterization    . Coronary angioplasty with stent placement      Current Outpatient Rx  Name  Route  Sig  Dispense  Refill  . aspirin 81 MG tablet    Oral   Take 81 mg by mouth.         Marland Kitchen BREO ELLIPTA 100-25 MCG/INH AEPB      as needed.            Dispense as written.   Marland Kitchen losartan (COZAAR) 100 MG tablet   Oral   Take 1 tablet (100 mg total) by mouth daily.   30 tablet   3   . ondansetron (ZOFRAN ODT) 4 MG disintegrating tablet   Oral   Take 1 tablet (4 mg total) by mouth every 6 (six) hours as needed for nausea or vomiting.   20 tablet   0   . tiotropium (SPIRIVA) 18 MCG inhalation capsule   Inhalation   Place 18 mcg into inhaler and inhale.           Allergies Amlodipine; Benazepril-hydrochlorothiazide; and Chlorthalidone  Family History  Problem Relation Age of Onset  . Hypertension Mother   . Hyperlipidemia Mother   . Heart attack Mother   . Hypertension Father   . Hyperlipidemia Father   . Heart attack Father     Social History Social History  Substance Use Topics  . Smoking status: Former Smoker -- 1.00 packs/day for 50 years    Types: Cigarettes  . Smokeless tobacco: None  . Alcohol Use: Yes  Comment: social     Review of Systems Constitutional: No fever/chills Eyes: No visual changes. ENT: No sore throat. Cardiovascular: Denies chest pain. Respiratory: Denies shortness of breath. Gastrointestinal: Mild nausea. No vomiting.  No diarrhea.  No constipation. couple loose stools.  Genitourinary: Negative for dysuria. Musculoskeletal: Negative for back pain. does report it hurts over the left kidney area.  Skin: Negative for rash. Neurological: Negative for headaches, focal weakness or numbness.   he no longer drinks daily. He reports that over the weekend he did drink a fair amount of alcohol and the symptoms started shortly thereafter. Denies any alcohol use last 4-5 days.  10-point ROS otherwise negative.  ____________________________________________   PHYSICAL EXAM:  VITAL SIGNS: ED Triage Vitals  Enc Vitals Group     BP 01/01/16 1623 150/86 mmHg     Pulse Rate 01/01/16 1623  73     Resp 01/01/16 1623 20     Temp 01/01/16 1623 98 F (36.7 C)     Temp Source 01/01/16 1623 Oral     SpO2 01/01/16 1623 95 %     Weight 01/01/16 1623 260 lb (117.935 kg)     Height 01/01/16 1623 5\' 7"  (1.702 m)     Head Cir --      Peak Flow --      Pain Score 01/01/16 1625 6     Pain Loc --      Pain Edu? --      Excl. in [place]? --    Constitutional: Alert and oriented. Well appearing and in no acute distress. Eyes: Conjunctivae are normal. PERRL. EOMI. Head: Atraumatic. Nose: No congestion/rhinnorhea. Mouth/Throat: Mucous membranes are moist.  Oropharynx non-erythematous. Neck: No stridor.   ardiovascular: Normal rate, regular rhythm. Grossly normal heart sounds.  Good peripheral circulation. Respiratory: Normal respiratory effort.  No retractions. Lungs CTAB. Gastrointestinal: Soft and noAbdomen is very protuberant but also obese. Soft. No CVA tenderness. Musculoskeletal: No lower extremity tenderness  Does have 1+ bilateral lower extremity edema which she reports he's had for well over 6 monthsjoint effusions. Neurologic:  Normal speech and language. No gross focal neurologic deficits are appreciated. Skin:  Skin is warm, dry and intact. No rash noted. Psychiatric: Mood and affect are normal. Speech and behavior are normal.  ____________________________________________   LABS (all labs ordered are listed, but only abnormal results are displayed)  Labs Reviewed  COMPREHENSIVE METABOLIC PANEL - Abnormal; Notable for the following:    Sodium 132 (*)    Chloride 96 (*)    Calcium 8.3 (*)    Total Protein 6.3 (*)    Albumin 2.8 (*)    AST 63 (*)    GFR calc non Af Amer 59 (*)    All other components within normal limits  CBC WITH DIFFERENTIAL/PLATELET - Abnormal; Notable for the following:    HCT 38.1 (*)    All other components within normal limits  URINALYSIS COMPLETEWITH MICROSCOPIC ([facility] ONLY) - Abnormal; Notable for the following:    Color, Urine YELLOW (*)     APPearance CLEAR (*)    Specific Gravity, Urine 1.003 (*)    Hgb urine dipstick 3+ (*)    Protein, ur 100 (*)    Bacteria, UA RARE (*)    Squamous Epithelial / LPF 0-5 (*)    All other components within normal limits  LIPASE, BLOOD  TROPONIN I   ____________________________________________  EKG  Reviewed and interpreted by me at 1635 Ventricular rate 75 QRS 76 QTc 440  Probable left ventricular hypertrophy, no evidence of acute ischemic abnormality  ____________________________________________  RADIOLOGY  CT CTA Abd/Pel w/cm &/or w/o cm (Final result) Result time: 01/01/16 19:26:57   Final result by Rad Results In Interface (01/01/16 19:26:57)   Narrative:   CLINICAL DATA: LEFT flank pain, nausea and vomiting 5 days. History of prostate cancer. Concern for ischemic colitis  EXAM: CTA ABDOMEN AND PELVIS wITHOUT AND WITH CONTRAST  TECHNIQUE: Multidetector CT imaging of the abdomen and pelvis was performed using the standard protocol during bolus administration of intravenous contrast. Multiplanar reconstructed images and MIPs were obtained and reviewed to evaluate the vascular anatomy.  CONTRAST: 150mL OMNIPAQUE IOHEXOL 350 MG/ML SOLN  COMPARISON: CT 04/03/2015  FINDINGS: Lower chest: Lung bases are clear.  Hepatobiliary: No focal hepatic lesion. No biliary duct dilatation. Gallbladder is normal. Common bile duct is normal.  Pancreas: Pancreas is normal. No ductal dilatation. No pancreatic inflammation.  Spleen: Normal spleen  Adrenals/urinary tract: Adrenal glands are normal. There are low-density round lesions within the kidneys which have simple fluid attenuation and is most consistent with benign cysts. Ureters and bladder normal.  Stomach/Bowel: Stomach, small-bowel, appendix, cecum normal. There is no bowel wall thickening of the colon. No inflammation. No abnormal enhancement. There several diverticula of the descending colon and proximal  sigmoid. Rectum normal.  Vascular/Lymphatic: Abdominal aorta is heavily calcified. The celiac trunk and SMA are widely patent. The mesenteric branches of the SMA are normal. No occlusion to the mesenteric arteries. The IMA is likewise patent without evidence of occlusion.  Bilateral single renal arteries are patent. There are calcifications at the ostia of the LEFT and RIGHT renal arteries.  The iliac arteries are non aneurysmal. Heavy intimal calcification of the iliac arteries.  Reproductive: Prostate small. A penile reservoir within the LEFT pelvis anteriorly appears predominantly collapsed.  Other: No free fluid.  Musculoskeletal: No aggressive osseous lesion.  Review of the MIP images confirms the above findings.  IMPRESSION: 1. No evidence of mesenteric ischemia. The SMA and celiac trunk are widely patent. The mesenteric branches are widely patent. 2. No evidence of colitis of the colon. No bowel inflammation. 3. Minimal diverticular disease descending colon sigmoid colon. 4. Heavy intimal calcification of the aorta and its branches. No occlusion. 5. Bilateral low-density renal lesions are likely benign cysts.   Electronically Signed By: Suzy Bouchard M.D. On: 01/01/2016 19:26    ____________________________________________   PROCEDURES  Procedure(s) performed: None  Critical Care performed: No  ____________________________________________   INITIAL IMPRESSION / ASSESSMENT AND PLAN / ED COURSE  Pertinent labs & imaging results that were available during my care of the patient were reviewed by me and considered in my medical decision making (see chart for details).  Rather persistent left flank pain which she reports is been diagnosed as possible cysts in the past on the left kidney, also now experiencing crampy pain after eating for the last few days. Also reports a couple loose and nonbloody stools. His symptoms sound like they could potentially  represent renal colic, kidney stone, but given his previous history and the crampiness associated with eating on the left side of it also entertain the possibility of mild ischemic colitis. I will obtain CT angiography of the abdomen and pelvis to further evaluate. Patient denies rectal bleeding or black or bloody stools, and does not wish to have a rectal exam at this time.   No cardiopulmonary symptoms. EKG shows LVH, but given his previous history will send a single troponin in the  event he has atypical symptoms. No pulmonary symptoms.   ----------------------------------------- 9:24 PM on 01/01/2016 -----------------------------------------  Patient resting comfortably, no distress. No evidence of acute intra-abdominal pathology or ischemic colitis. Remaining labs fairly unremarkable. Patient reports he feels well, no further nausea and pain improved. Repeat exam abdomen soft nontender nondistended.  No evidence of acute intra-abdominal pathology or acute cardiac abnormality. This point we'll discharge the patient to home. Careful return precautions advised to patient and family who are all agreeable. He'll follow up closely with his primary care doctor. Mild hyponatremia appears to be somewhat chronic in nature. A few bacteria are seen on urinalysis without white cells leukocyte Estrace or nitrates. I will send a culture but do not find immediate need for treatment.  Return precautions and treatment recommendations and follow-up discussed with the patient who is agreeable with the plan.  ____________________________________________   FINAL CLINICAL IMPRESSION(S) / ED DIAGNOSES  Final diagnoses:  Left flank pain  Renal cyst  Aortic atherosclerosis (HCC)      Delman Kitten, MD 01/01/16 2125  Delman Kitten, MD 01/01/16 2125

## 2016-01-01 NOTE — Discharge Instructions (Signed)
You were seen in the emergency room for left flank and abdominal pain. It is important that you follow up closely with your primary care doctor in the next couple of days. You have cysts on your kidney that require follow-up too.  If you're unable to see your primary care doctor you may return to the emergency room or go to the [place] walk-in clinic in 2 or 3 days for reexam.  Please return to the emergency room right away if you are to develop chest pain, trouble breathing, a fever, severe nausea, your pain becomes severe or worsens, you are unable to keep food down, begin vomiting any dark or bloody fluid, you develop any dark or bloody stools, feel dehydrated, or other new concerns or symptoms arise.   Flank Pain Flank pain refers to pain that is located on the side of the body between the upper abdomen and the back. The pain may occur over a short period of time (acute) or may be long-term or reoccurring (chronic). It may be mild or severe. Flank pain can be caused by many things. CAUSES  Some of the more common causes of flank pain include:  Muscle strains.   Muscle spasms.   A disease of your spine (vertebral disk disease).   A lung infection (pneumonia).   Fluid around your lungs (pulmonary edema).   A kidney infection.   Kidney stones.   A very painful skin rash caused by the chickenpox virus (shingles).   Gallbladder disease.  [place] care will depend on the cause of your pain. In general,  Rest as directed by your caregiver.  Drink enough fluids to keep your urine clear or pale yellow.  Only take over-the-counter or prescription medicines as directed by your caregiver. Some medicines may help relieve the pain.  Tell your caregiver about any changes in your pain.  Follow up with your caregiver as directed. SEEK IMMEDIATE MEDICAL CARE IF:   Your pain is not controlled with medicine.   You have new or worsening symptoms.  Your  pain increases.   You have abdominal pain.   You have shortness of breath.   You have persistent nausea or vomiting.   You have swelling in your abdomen.   You feel faint or pass out.   You have blood in your urine.  You have a fever or persistent symptoms for more than 2-3 days.  You have a fever and your symptoms suddenly get worse. MAKE SURE YOU:   Understand these instructions.  Will watch your condition.  Will get help right away if you are not doing well or get worse.   This information is not intended to replace advice given to you by your health care provider. Make sure you discuss any questions you have with your health care provider.   Document Released: 01/25/2006 Document Revised: 08/28/2012 Document Reviewed: 07/18/2012 Elsevier Interactive Patient Education [employer].

## 2016-01-01 NOTE — ED Notes (Signed)
Patient transported to CT 

## 2016-01-01 NOTE — ED Notes (Signed)
Patient returned from [place]. Pt in no acute distress.

## 2016-01-01 NOTE — ED Notes (Signed)
Reports pain in left flank area.  Diarrhea Tuesday amd anausea since.  Pt states he drank a whole lot of alcohol last weekend and he does not usually do that.

## 2016-01-06 ENCOUNTER — Ambulatory Visit: Payer: Medicare Other | Attending: Specialist

## 2016-01-06 DIAGNOSIS — G4733 Obstructive sleep apnea (adult) (pediatric): Secondary | ICD-10-CM | POA: Diagnosis not present

## 2016-01-08 ENCOUNTER — Encounter: Payer: Self-pay | Admitting: Gastroenterology

## 2016-01-13 ENCOUNTER — Ambulatory Visit: Payer: Medicare Other | Attending: Specialist

## 2016-01-13 DIAGNOSIS — G4733 Obstructive sleep apnea (adult) (pediatric): Secondary | ICD-10-CM | POA: Insufficient documentation

## 2016-01-13 DIAGNOSIS — G4761 Periodic limb movement disorder: Secondary | ICD-10-CM | POA: Diagnosis not present

## 2016-01-24 ENCOUNTER — Ambulatory Visit: Payer: Medicare Other | Admitting: Cardiovascular Disease

## 2016-01-29 ENCOUNTER — Emergency Department
Admission: EM | Admit: 2016-01-29 | Discharge: 2016-01-29 | Disposition: A | Discharge status: Home / Self Care | Payer: Medicare Other | Attending: Emergency Medicine | Admitting: Emergency Medicine

## 2016-01-29 ENCOUNTER — Emergency Department: Payer: Medicare Other

## 2016-01-29 ENCOUNTER — Encounter: Payer: Self-pay | Admitting: Emergency Medicine

## 2016-01-29 DIAGNOSIS — Z87891 Personal history of nicotine dependence: Secondary | ICD-10-CM | POA: Insufficient documentation

## 2016-01-29 DIAGNOSIS — Z79899 Other long term (current) drug therapy: Secondary | ICD-10-CM | POA: Diagnosis not present

## 2016-01-29 DIAGNOSIS — Z7982 Long term (current) use of aspirin: Secondary | ICD-10-CM | POA: Diagnosis not present

## 2016-01-29 DIAGNOSIS — W101XXA Fall (on)(from) sidewalk curb, initial encounter: Secondary | ICD-10-CM | POA: Diagnosis not present

## 2016-01-29 DIAGNOSIS — S63501A Unspecified sprain of right wrist, initial encounter: Secondary | ICD-10-CM | POA: Insufficient documentation

## 2016-01-29 DIAGNOSIS — S6991XA Unspecified injury of right wrist, hand and finger(s), initial encounter: Secondary | ICD-10-CM | POA: Diagnosis present

## 2016-01-29 DIAGNOSIS — Y998 Other external cause status: Secondary | ICD-10-CM | POA: Insufficient documentation

## 2016-01-29 DIAGNOSIS — I10 Essential (primary) hypertension: Secondary | ICD-10-CM | POA: Diagnosis not present

## 2016-01-29 DIAGNOSIS — Y9389 Activity, other specified: Secondary | ICD-10-CM | POA: Insufficient documentation

## 2016-01-29 DIAGNOSIS — Y92480 Sidewalk as the place of occurrence of the external cause: Secondary | ICD-10-CM | POA: Diagnosis not present

## 2016-01-29 MED ORDER — HYDROCODONE-ACETAMINOPHEN 5-325 MG PO TABS: 1.0000 | ORAL_TABLET | Freq: Four times a day (QID) | ORAL | Status: DC | PRN

## 2016-01-29 MED ORDER — HYDROCODONE-ACETAMINOPHEN 5-325 MG PO TABS
2.0000 | ORAL_TABLET | Freq: Once | ORAL | Status: AC
  Administered 2016-01-29: 2 via ORAL
  Filled 2016-01-29: qty 2

## 2016-01-29 NOTE — ED Notes (Signed)
Reports tripping over a crack in the sidewalk yesterday and fell.  Pain and swelling to right wrist.

## 2016-01-29 NOTE — ED Notes (Signed)
Pt with right wrist pain. Ace wrap around wrist, swollen right hand. Ace wrap removed by PA to restore circulation. Wrist with + pulse, warm. No acute deformity.

## 2016-01-29 NOTE — Discharge Instructions (Signed)
Follow-up with your doctor if any continued problems in one to 2 weeks. Ice and elevate for swelling and pain. Norco as needed for pain. Be aware that this could cause drowsiness which may increase your risk for falling. Wear wrist splint for support and protection.

## 2016-01-29 NOTE — ED Provider Notes (Signed)
[facility] Emergency Department Provider Note  ____________________________________________  Time seen: Approximately 7:58 AM  I have reviewed the triage vital signs and the nursing notes.   HISTORY  Chief Complaint Wrist Pain   HPI [name] is a 81 y.o. male with complaint of right wrist pain. Patient states that he tripped over a crack in the sidewalk yesterday and fell forward. He caught his weight on both his wrist however his right wrist has continued to [name] throughout the night. Patient wrapped his wrist with an Ace wrap and his hand is quite swollen. Patient denies any previous fractures to his wrist. He denies any head injury or loss of consciousness. He has continued to ambulate since the injury. He rates his pain as 10 over 10. He states he did not take any medication prior to his arrival in the emergency room.   Past Medical History  Diagnosis Date  . COPD (chronic obstructive pulmonary disease) (HCC)     COPD  . Prostate CA [facility])     prostate ca dx 80 ys ago;  . Prostate CA [facility])     prostate ca dx 20 yrs ago  . Hypertension   . Hyperlipidemia   . Alcoholism ([place])   . PVD (peripheral vascular disease) ([name])   . Degenerative joint disease   . Bipolar affective disorder ([place])   . Tobacco abuse   . Coronary artery disease February 2005    PCI and Taxus drug-eluting stent placement to the distal RCA (3.5 x 12 mm)  . Essential hypertension   . Asthma     Patient Active Problem List   Diagnosis Date Noted  . Hyperlipidemia 12/03/2015  . Essential hypertension   . Coronary artery disease 01/19/2004    Past Surgical History  Procedure Laterality Date  . Total hip arthroplasty      right  . Cardiac catheterization    . Coronary angioplasty with stent placement      Current Outpatient Rx  Name  Route  Sig  Dispense  Refill  . aspirin 81 MG tablet   Oral   Take 81 mg by mouth.         Marland Kitchen BREO ELLIPTA 100-25 MCG/INH AEPB       as needed.            Dispense as written.   Marland Kitchen HYDROcodone-acetaminophen (NORCO/VICODIN) 5-325 MG tablet   Oral   Take 1-2 tablets by mouth every 6 (six) hours as needed for moderate pain.   30 tablet   0   . losartan (COZAAR) 100 MG tablet   Oral   Take 1 tablet (100 mg total) by mouth daily.   30 tablet   3   . ondansetron (ZOFRAN ODT) 4 MG disintegrating tablet   Oral   Take 1 tablet (4 mg total) by mouth every 6 (six) hours as needed for nausea or vomiting.   20 tablet   0   . tiotropium (SPIRIVA) 18 MCG inhalation capsule   Inhalation   Place 18 mcg into inhaler and inhale.           Allergies Amlodipine; Benazepril-hydrochlorothiazide; and Chlorthalidone  Family History  Problem Relation Age of Onset  . Hypertension Mother   . Hyperlipidemia Mother   . Heart attack Mother   . Hypertension Father   . Hyperlipidemia Father   . Heart attack Father     Social History Social History  Substance Use Topics  . Smoking status: Former  Smoker -- 1.00 packs/day for 50 years    Types: Cigarettes  . Smokeless tobacco: None  . Alcohol Use: Yes     Comment: social     Review of Systems Constitutional: No fever/chills Eyes: No visual changes. Cardiovascular: Denies chest pain. Respiratory: Denies shortness of breath. Gastrointestinal: No abdominal pain.  No nausea, no vomiting.  Musculoskeletal:  Positive right wrist pain. Skin: Negative for rash. No abrasions or ecchymosis. There is moderate swelling of the right hand. Neurological: Negative for headaches, focal weakness or numbness.  10-point ROS otherwise negative.  ____________________________________________   PHYSICAL EXAM:  VITAL SIGNS: ED Triage Vitals  Enc Vitals Group     BP 01/29/16 0732 154/80 mmHg     Pulse Rate 01/29/16 0732 89     Resp 01/29/16 0732 19     Temp 01/29/16 0732 98 F (36.7 C)     Temp Source 01/29/16 0732 Oral     SpO2 01/29/16 0732 96 %     Weight 01/29/16 0732  300 lb (136.079 kg)     Height 01/29/16 0732 5\' 7"  (1.702 m)     Head Cir --      Peak Flow --      Pain Score 01/29/16 0733 10     Pain Loc --      Pain Edu? --      Excl. in [place]? --     Constitutional: Alert and oriented. Well appearing and in no acute distress. Eyes: Conjunctivae are normal. PERRL. EOMI. Head: Atraumatic. Nose: No congestion/rhinnorhea. Neck: No stridor.  No cervical tenderness on palpation posteriorly. Cardiovascular: Normal rate, regular rhythm. Grossly normal heart sounds.  Good peripheral circulation. Respiratory: Normal respiratory effort.  No retractions. Lungs CTAB. Gastrointestinal: Soft and nontender. No distention.  Musculoskeletal: Right wrist is wrapped with a elastic sticky wrap that is very tight. There is moderate swelling of the dorsal aspect of the right hand and fingers quite possibly from the tightness of the wrap. There is no gross deformity noted after removing this. There is moderate tenderness on palpation and range of motion is restricted in flexion and extension secondary to pain. Motor sensory function intact. Neurologic:  Normal speech and language. No gross focal neurologic deficits are appreciated. No gait instability. Skin:  Skin is warm, dry and intact. No ecchymosis or abrasions were noted. Psychiatric: Mood and affect are normal. Speech and behavior are normal.  ____________________________________________   LABS (all labs ordered are listed, but only abnormal results are displayed)  Labs Reviewed - No data to display  RADIOLOGY  Right wrist per radiologist shows extensive sinus tissue swelling. No acute fracture or dislocation was seen. Multifocal arthritic changes. ____________________________________________   PROCEDURES  Procedure(s) performed: None  Critical Care performed: No  ____________________________________________   INITIAL IMPRESSION / ASSESSMENT AND PLAN / ED COURSE  Pertinent labs & imaging results that  were available during my care of the patient were reviewed by me and considered in my medical decision making (see chart for details).  Patient was given Norco while in the emergency room and also a prescription for the same. He is not to rewrap his wrist. He is placed in a cockup wrist splint and told to ice and elevate. Norco as needed for pain and to follow-up with his primary care doctor if any continued problems. ____________________________________________   FINAL CLINICAL IMPRESSION(S) / ED DIAGNOSES  Final diagnoses:  Sprain of right wrist, initial encounter      Johnn Hai, PA-C 01/29/16  [place], MD 01/29/16 1418

## 2016-02-11 ENCOUNTER — Encounter: Payer: Self-pay | Admitting: Cardiovascular Disease

## 2016-02-11 ENCOUNTER — Ambulatory Visit (INDEPENDENT_AMBULATORY_CARE_PROVIDER_SITE_OTHER): Payer: Medicare Other | Admitting: Cardiovascular Disease

## 2016-02-11 VITALS — BP 110/70 | HR 71 | Ht 67.0 in | Wt 304.0 lb

## 2016-02-11 DIAGNOSIS — R0609 Other forms of dyspnea: Secondary | ICD-10-CM

## 2016-02-11 DIAGNOSIS — I25810 Atherosclerosis of coronary artery bypass graft(s) without angina pectoris: Secondary | ICD-10-CM | POA: Diagnosis not present

## 2016-02-11 DIAGNOSIS — I10 Essential (primary) hypertension: Secondary | ICD-10-CM

## 2016-02-11 DIAGNOSIS — R0602 Shortness of breath: Secondary | ICD-10-CM | POA: Diagnosis not present

## 2016-02-11 DIAGNOSIS — E785 Hyperlipidemia, unspecified: Secondary | ICD-10-CM

## 2016-02-11 NOTE — Progress Notes (Signed)
Primary care physician: Dr. [name].  HPI  This is a 81 year old African-American male who is here today for a follow-up visit regarding  hypertension and coronary artery disease. He had cardiac catheterization done in 2005 which showed single-vessel coronary artery disease involving the distal right coronary artery. He had an angioplasty and Taxus drug-eluting stent placement. No cardiac events since then. He has extensive medical problems that include borderline diabetes, hypertension, hyperlipidemia, sleep apnea on CPAP, morbid obesity, previous tobacco use, bipolar disorder and peripheral arterial disease. He follows with Dr. Raul Del for his lung issues. During last visit, the patient was severely hypertensive with systolic blood pressure above 200. I started him on chlorthalidone. Follow-up basic metabolic profile showed significant hyponatremia (124). Thus, it was discontinued and I started him on losartan 100 mg once daily. No side effects with this medication. He has chronic constipation and follows up with gastroenterology. Since his blood pressure has been controlled, he actually has been feeling better with no further chest pain. He has chronic exertional dyspnea.  Allergies  Allergen Reactions  . Amlodipine     Constipation   . Benazepril-Hydrochlorothiazide     Constipation   . Chlorthalidone     Hyponatremia  . Other Other (See Comments)    "ANY BLOOD PRESSURE MEDICATIONS THAT I'VE TRIED" - PT. DOES NOT REMEMBER WHICH ONES     Current Outpatient Prescriptions on File Prior to Visit  Medication Sig Dispense Refill  . aspirin 81 MG tablet Take 81 mg by mouth daily.     Marland Kitchen BREO ELLIPTA 100-25 MCG/INH AEPB Inhale 1 puff into the lungs daily.     Marland Kitchen HYDROcodone-acetaminophen (NORCO/VICODIN) 5-325 MG tablet Take 1-2 tablets by mouth every 6 (six) hours as needed for moderate pain. 30 tablet 0  . losartan (COZAAR) 100 MG tablet Take 1 tablet (100 mg total) by mouth daily. 30 tablet 3   . ondansetron (ZOFRAN ODT) 4 MG disintegrating tablet Take 1 tablet (4 mg total) by mouth every 6 (six) hours as needed for nausea or vomiting. 20 tablet 0   No current facility-administered medications on file prior to visit.     Past Medical History  Diagnosis Date  . COPD (chronic obstructive pulmonary disease) (HCC)     COPD  . Prostate CA [facility])     prostate ca dx 53 ys ago;  . Prostate CA [facility])     prostate ca dx 20 yrs ago  . Hypertension   . Hyperlipidemia   . Alcoholism ([place])   . PVD (peripheral vascular disease) ([place])   . Degenerative joint disease   . Bipolar affective disorder ([place])   . Tobacco abuse   . Coronary artery disease February 2005    PCI and Taxus drug-eluting stent placement to the distal RCA (3.5 x 12 mm)  . Essential hypertension   . Asthma      Past Surgical History  Procedure Laterality Date  . Total hip arthroplasty      right  . Cardiac catheterization    . Coronary angioplasty with stent placement       Family History  Problem Relation Age of Onset  . Hypertension Mother   . Hyperlipidemia Mother   . Heart attack Mother   . Hypertension Father   . Hyperlipidemia Father   . Heart attack Father      Social History   Social History  . Marital Status: Legally Separated    Spouse Name: N/A  . Number of Children: N/A  .  Years of Education: N/A   Occupational History  . Not on file.   Social History Main Topics  . Smoking status: Former Smoker -- 1.00 packs/day for 50 years    Types: Cigarettes  . Smokeless tobacco: Not on file  . Alcohol Use: Yes     Comment: social   . Drug Use: No  . Sexual Activity: Not on file   Other Topics Concern  . Not on file   Social History Narrative     ROS A 10 point review of system was performed. It is negative other than that mentioned in the history of present illness.   PHYSICAL EXAM   BP 110/70 mmHg  Pulse 71  Ht 5\' 7"  (1.702 m)  Wt 304 lb (137.893 kg)  BMI 47.60 kg/m2   SpO2 98% Constitutional: He is oriented to person, place, and time. He appears well-developed and well-nourished. No distress.  HENT: No nasal discharge.  Head: Normocephalic and atraumatic.  Eyes: Pupils are equal and round.  No discharge. Neck: Normal range of motion. Neck supple. No JVD present. No thyromegaly present.  Cardiovascular: Normal rate, regular rhythm, normal heart sounds. Exam reveals no gallop and no friction rub. No murmur heard.  Pulmonary/Chest: Effort normal and breath sounds normal. No stridor. No respiratory distress. He has no wheezes. He has no rales. He exhibits no tenderness.  Abdominal: Soft. Bowel sounds are normal. He exhibits no distension. There is no tenderness. There is no rebound and no guarding.  Musculoskeletal: Normal range of motion. He exhibits no edema and no tenderness.  Neurological: He is alert and oriented to person, place, and time. Coordination normal.  Skin: Skin is warm and dry. No rash noted. He is not diaphoretic. No erythema. No pallor.  Psychiatric: He has a normal mood and affect. His behavior is normal. Judgment and thought content normal.       EKG: Normal sinus rhythm with  Nonspecific T wave changes.   ASSESSMENT AND PLAN

## 2016-02-11 NOTE — Assessment & Plan Note (Signed)
Given his history of coronary artery disease, he should be on a statin. I requested fasting lipid and liver profile.

## 2016-02-11 NOTE — Assessment & Plan Note (Signed)
Blood pressure is now well controlled on losartan.

## 2016-02-11 NOTE — Assessment & Plan Note (Signed)
This is likely multifactorial due to physical deconditioning, lung disease and possible pulmonary hypertension. I requested an echocardiogram for evaluation.

## 2016-02-11 NOTE — Assessment & Plan Note (Signed)
No further chest pain since controlling his blood pressure. Continue medical therapy.

## 2016-02-11 NOTE — Patient Instructions (Addendum)
Medication Instructions:  Your physician recommends that you continue on your current medications as directed. Please refer to the Current Medication list given to you today.   Labwork: Fasting liver and lipid profile  Testing/Procedures: Your physician has requested that you have an echocardiogram. Echocardiography is a painless test that uses sound waves to create images of your heart. It provides your doctor with information about the size and shape of your heart and how well your heart's chambers and valves are working. This procedure takes approximately one hour. There are no restrictions for this procedure.    Follow-Up: Your physician wants you to follow-up in: six months with Dr. Fletcher Anon.  You will receive a reminder letter in the mail two months in advance. If you don't receive a letter, please call our office to schedule the follow-up appointment.   Any Other Special Instructions Will Be Listed Below (If Applicable).     If you need a refill on your cardiac medications before your next appointment, please call your pharmacy.  Echocardiogram An echocardiogram, or echocardiography, uses sound waves (ultrasound) to produce an image of your heart. The echocardiogram is simple, painless, obtained within a short period of time, and offers valuable information to your health care provider. The images from an echocardiogram can provide information such as:  Evidence of coronary artery disease (CAD).  Heart size.  Heart muscle function.  Heart valve function.  Aneurysm detection.  Evidence of a past heart attack.  Fluid buildup around the heart.  Heart muscle thickening.  Assess heart valve function. LET [facility] CARE PROVIDER KNOW ABOUT:  Any allergies you have.  All medicines you are taking, including vitamins, herbs, eye drops, creams, and over-the-counter medicines.  Previous problems you or members of your family have had with the use of anesthetics.  Any  blood disorders you have.  Previous surgeries you have had.  Medical conditions you have.  Possibility of pregnancy, if this applies. BEFORE THE PROCEDURE  No special preparation is needed. Eat and drink normally.  PROCEDURE   In order to produce an image of your heart, gel will be applied to your chest and a wand-like tool (transducer) will be moved over your chest. The gel will help transmit the sound waves from the transducer. The sound waves will harmlessly bounce off your heart to allow the heart images to be captured in real-time motion. These images will then be recorded.  You may need an IV to receive a medicine that improves the quality of the pictures. AFTER THE PROCEDURE You may return to your normal schedule including diet, activities, and medicines, unless your health care provider tells you otherwise.   This information is not intended to replace advice given to you by your health care provider. Make sure you discuss any questions you have with your health care provider.   Document Released: 12/01/2000 Document Revised: 12/25/2014 Document Reviewed: 08/11/2013 Elsevier Interactive Patient Education [employer].

## 2016-03-02 ENCOUNTER — Ambulatory Visit (INDEPENDENT_AMBULATORY_CARE_PROVIDER_SITE_OTHER): Payer: Medicare Other

## 2016-03-02 ENCOUNTER — Other Ambulatory Visit (INDEPENDENT_AMBULATORY_CARE_PROVIDER_SITE_OTHER): Payer: Medicare Other

## 2016-03-02 ENCOUNTER — Other Ambulatory Visit: Payer: Self-pay

## 2016-03-02 DIAGNOSIS — I25810 Atherosclerosis of coronary artery bypass graft(s) without angina pectoris: Secondary | ICD-10-CM | POA: Diagnosis not present

## 2016-03-02 DIAGNOSIS — R0602 Shortness of breath: Secondary | ICD-10-CM | POA: Diagnosis not present

## 2016-03-03 LAB — LIPID PANEL
Chol/HDL Ratio: 3.8 ratio units (ref 0.0–5.0)
Cholesterol, Total: 218 mg/dL — ABNORMAL HIGH (ref 100–199)
HDL: 58 mg/dL (ref >39)
LDL Calculated: 135 mg/dL — ABNORMAL HIGH (ref 0–99)
Triglycerides: 124 mg/dL (ref 0–149)
VLDL Cholesterol Cal: 25 mg/dL (ref 5–40)

## 2016-03-03 LAB — HEPATIC FUNCTION PANEL
ALBUMIN: 3.1 g/dL — AB (ref 3.5–4.8)
ALK PHOS: 89 IU/L (ref 39–117)
ALT: 9 IU/L (ref 0–44)
AST: 23 IU/L (ref 0–40)
BILIRUBIN TOTAL: 0.3 mg/dL (ref 0.0–1.2)
BILIRUBIN, DIRECT: 0.09 mg/dL (ref 0.00–0.40)
TOTAL PROTEIN: 5.9 g/dL — AB (ref 6.0–8.5)

## 2016-03-06 ENCOUNTER — Other Ambulatory Visit: Payer: Self-pay

## 2016-03-06 DIAGNOSIS — E785 Hyperlipidemia, unspecified: Secondary | ICD-10-CM

## 2016-03-06 MED ORDER — ATORVASTATIN CALCIUM 40 MG PO TABS: 40.0000 mg | ORAL_TABLET | Freq: Every day | ORAL | Status: DC

## 2016-03-10 ENCOUNTER — Other Ambulatory Visit: Payer: Medicare Other

## 2016-04-20 ENCOUNTER — Other Ambulatory Visit: Payer: Self-pay | Admitting: *Deleted

## 2016-04-20 MED ORDER — LOSARTAN POTASSIUM 100 MG PO TABS: 100.0000 mg | ORAL_TABLET | Freq: Every day | ORAL | Status: DC

## 2016-04-20 NOTE — Telephone Encounter (Signed)
Requested Prescriptions   Signed Prescriptions Disp Refills  . losartan (COZAAR) 100 MG tablet 30 tablet 3    Sig: Take 1 tablet (100 mg total) by mouth daily.    Authorizing Provider: Kathlyn Sacramento A    Ordering User: Britt Bottom

## 2016-04-21 ENCOUNTER — Encounter: Payer: Self-pay | Admitting: Physical Therapy

## 2016-04-21 ENCOUNTER — Other Ambulatory Visit: Payer: Medicare Other

## 2016-04-21 ENCOUNTER — Ambulatory Visit: Payer: Medicare Other | Attending: Gastroenterology | Admitting: Physical Therapy

## 2016-04-21 DIAGNOSIS — M6281 Muscle weakness (generalized): Secondary | ICD-10-CM | POA: Insufficient documentation

## 2016-04-21 DIAGNOSIS — R279 Unspecified lack of coordination: Secondary | ICD-10-CM | POA: Diagnosis present

## 2016-04-21 DIAGNOSIS — R2689 Other abnormalities of gait and mobility: Secondary | ICD-10-CM | POA: Diagnosis present

## 2016-04-21 NOTE — Patient Instructions (Addendum)
You are now ready to begin training the deep core muscles system: diaphragm, transverse abdominis, pelvic floor . These muscles must work together as a team.      The key to these exercises to train the brain to coordinate the timing of these muscles and to have them turn on for long periods of time to hold you upright against gravity (especially important if you are on your feet all day).These muscles are postural muscles and play a role stabilizing your spine and bodyweight. By doing these repetitions slowly and correctly instead of doing crunches, you will achieve a flatter belly without a lower pooch. You are also placing your spine in a more neutral position and breathing properly which in turn, decreases your risk for problems related to your pelvic floor, abdominal, and low back such as pelvic organ prolapse, hernias, diastasis recti (separation of superficial muscles), disk herniations, spinal fractures. These exercises set a solid foundation for you to later progress to resistance/ strength training with therabands and weights and return to other typical fitness exercises with a stronger deeper core.  ___________________   Seated stretches to increase midback rotation: 10 x 3 x day each side   Sidebend "Like a rainbow"       Twist: R hand on L thigh, inhale lengthen your spine, exhale look over your shoulder            Inhale Chest lift, look up, exhale roll forward in your spine "Cat cow"        ________________________

## 2016-04-21 NOTE — Therapy (Addendum)
[place] MAIN [facility] SERVICES [address] [place], [place], [address] Phone: [phone]   Fax:  [phone]  Physical Therapy Evaluation  Patient Details  Name: [name] MRN: [id number] Date of Birth: 22-Jul-1935 Referring Provider: Darrick Grinder  Encounter Date: 04/21/2016      PT End of Session - 05/12/16 0818    Visit Number 1   Number of Visits 12   PT Start Time 0805   PT Stop Time 0900   PT Time Calculation (min) 55 min      Past Medical History  Diagnosis Date  . COPD (chronic obstructive pulmonary disease) (HCC)     COPD  . Prostate CA [facility])     prostate ca dx 38 ys ago;  . Prostate CA [facility])     prostate ca dx 20 yrs ago  . Hypertension   . Hyperlipidemia   . Alcoholism ([place])   . PVD (peripheral vascular disease) ([place])   . Degenerative joint disease   . Bipolar affective disorder ([place])   . Tobacco abuse   . Coronary artery disease February 2005    PCI and Taxus drug-eluting stent placement to the distal RCA (3.5 x 12 mm)  . Essential hypertension   . Asthma     Past Surgical History  Procedure Laterality Date  . Total hip arthroplasty      right  . Cardiac catheterization    . Coronary angioplasty with stent placement      There were no vitals filed for this visit.       Subjective Assessment - 05/12/16 1037    Subjective 1) Pt reports constipation and takes stool softener daily.  Stool consistency type 1-4 (type 4 occurs 75% of the time). Pt reports hemorrhoids.  Pt states constipation has occurred the last ten years when he had gastritis/ GERD. These conditions now "comes and goes" and has been "under control" without medications. Regular colonoscopy with neg findings. 2)  Urination incontinence started 21months to a year. Leakage occurs after initially stopping urination. Uses CPAP after Dx of OSA which decreased his nocturia from 5-7x to 3-5 x. / night.  Hx of prostate CA w/ radiation therapy, penile implant  20 years ago. Currently the penile implant is not working well  and thus, decreased erectile function and sexual function.     Patient is accompained by: Family member  daughter    Pertinent History uses a SPC, relies on dtr on transportation.             [facility] PT Assessment - 05/12/16 0816    Assessment   Medical Diagnosis constipation   Referring Provider Scarlett   Precautions   Precautions None   Restrictions   Weight Bearing Restrictions No   Balance Screen   Has the patient fallen in the past 6 months No   [place] residence   Prior Function   Level of Independence Independent with basic ADLs  SPC in the community, not in the home    Observation/Other Assessments   Other Surveys  --  21%  COREFO    Coordination   Gross Motor Movements are Fluid and Coordinated --  limited diaphragmatic excursion,abdominal adipose   Fine Motor Movements are Fluid and Coordinated --  abdominal straining with bowel movement cue   Other:   Other/ Comments abdominal straining with cue for bowel movement   Posture/Postural Control   Posture/Postural Control --  able to activate TrA  with exhalation,   AROM   Overall AROM Comments ~20 deg trunk rotation B, ~30% side flexion B   Ambulation/Gait   Gait Comments minimal trunk rotation, lacking lumbopelvic rhythm due to increased abdomen adipose                    [facility] Adult PT Treatment/Exercise - 05/12/16 0817    Therapeutic Activites    Therapeutic Activities --  see pt instruction   Neuro Re-ed    Neuro Re-ed Details  see pt instructions   Exercises   Exercises --  see pt instructions                PT Education - 05/12/16 0818    Education provided Yes   Education Details POC, anatomy, phsyiology, goals, body mechanics with toileting and breathing coordination with pelvic floor mm   Person(s) Educated Patient   Methods Explanation;Demonstration;Tactile cues;Verbal  cues;Handout   Comprehension Returned demonstration;Verbalized understanding             PT Long Term Goals - 04/21/16 0927    PT LONG TERM GOAL #1   Title Pt will report decreased urinary incontinence by 50% in order to restore pelvic floor function   Time 12   Period Weeks   Status New   PT LONG TERM GOAL #2   Title Pt will report decreased dependence on stool softener and abdominal straining by 50% and  in order to restore GI and pelvic floor function    Time 12   Period Weeks   Status New   PT LONG TERM GOAL #3   Title Pt will demo proper diaphragmatic breathing with pelvic floor in order to minimize straining on pelvic floor with toileting   Time 12   Period Weeks   Status New   PT LONG TERM GOAL #4   Title Pt will demo increased spinal rotation from ~20 deg trunk rotation bilaterally to >40 deg and  side flexion B from 30% to ~45 % in order to optimize diaphragmatic excursion for health.   Time 12   Period Weeks   Status New   PT LONG TERM GOAL #5   Title Pt 's COREFO score will decrease from 21% to < 15% in order to normalize colorectal function.    Time 12   Period Weeks   Status New               Plan - 05/12/16 0819    Clinical Impression Statement Pt is a 81 yo male who c/o constipation and urinary incontinence which impact his QOL. Pt's clinical presentations include poor coordination of deep core mm, poor body mechanics with functional activities which places abdominal and pelvic floor strain, and spinal mobility restrictions. Pt's co-morbidities impact his prognosis. Patient will benefit from skilled therapeutic intervention in order to improve the following deficits and impairments:  Abnormal gait, Decreased coordination, Difficulty walking, Decreased range of motion, Decreased safety awareness, Decreased balance, Decreased strength, Improper body mechanics, Other (comment), Decreased mobility, Decreased activity tolerance, Increased muscle spasms,  Obesity, Decreased endurance, Postural dysfunction   Rehab Potential Good   PT Frequency 1x / week   PT Duration 12 weeks   PT Treatment/Interventions ADLs/Self Care Home Management;Aquatic Therapy;Gait training;Stair training;Moist Heat;Traction;Functional mobility training;Therapeutic activities;Therapeutic exercise;Balance training;Neuromuscular re-education;Manual techniques;Patient/family education;Scar mobilization;Passive range of motion;Energy conservation;Taping;Manual lymph drainage   Consulted and Agree with Plan of Care Patient      Patient will benefit from skilled therapeutic intervention in order  to improve the following deficits and impairments:  Abnormal gait, Decreased coordination, Difficulty walking, Decreased range of motion, Decreased safety awareness, Decreased balance, Decreased strength, Improper body mechanics, Other (comment), Decreased mobility, Decreased activity tolerance, Increased muscle spasms, Obesity, Decreased endurance, Postural dysfunction  Visit Diagnosis: Other abnormalities of gait and mobility  Unspecified lack of coordination  Muscle weakness (generalized)     Problem List Patient Active Problem List   Diagnosis Date Noted  . Exertional dyspnea 02/11/2016  . Hyperlipidemia 12/03/2015  . Essential hypertension   . Coronary artery disease 01/19/2004    Jerl Mina ,PT, DPT, E-RYT  05/12/2016, 4:55 PM  [place] MAIN [facility] SERVICES [address] [place], [place], [address] Phone: [phone]   Fax:  [phone]  Name: [name] MRN: [id number] Date of Birth: [birth year]

## 2016-04-26 ENCOUNTER — Other Ambulatory Visit (INDEPENDENT_AMBULATORY_CARE_PROVIDER_SITE_OTHER): Payer: Medicare Other

## 2016-04-26 DIAGNOSIS — I25118 Atherosclerotic heart disease of native coronary artery with other forms of angina pectoris: Secondary | ICD-10-CM

## 2016-04-26 DIAGNOSIS — E785 Hyperlipidemia, unspecified: Secondary | ICD-10-CM

## 2016-04-27 LAB — HEPATIC FUNCTION PANEL
ALBUMIN: 3.3 g/dL — AB (ref 3.5–4.8)
ALT: 22 IU/L (ref 0–44)
AST: 37 IU/L (ref 0–40)
Alkaline Phosphatase: 116 IU/L (ref 39–117)
Bilirubin Total: <0.2 mg/dL (ref 0.0–1.2)
Bilirubin, Direct: 0.08 mg/dL (ref 0.00–0.40)
TOTAL PROTEIN: 6.1 g/dL (ref 6.0–8.5)

## 2016-04-27 LAB — LIPID PANEL
CHOL/HDL RATIO: 2.9 ratio (ref 0.0–5.0)
Cholesterol, Total: 189 mg/dL (ref 100–199)
HDL: 66 mg/dL (ref >39)
LDL CALC: 97 mg/dL (ref 0–99)
TRIGLYCERIDES: 129 mg/dL (ref 0–149)
VLDL CHOLESTEROL CAL: 26 mg/dL (ref 5–40)

## 2016-05-12 ENCOUNTER — Ambulatory Visit: Payer: Medicare Other | Admitting: Physical Therapy

## 2016-05-12 DIAGNOSIS — R2689 Other abnormalities of gait and mobility: Secondary | ICD-10-CM | POA: Diagnosis not present

## 2016-05-12 DIAGNOSIS — R279 Unspecified lack of coordination: Secondary | ICD-10-CM

## 2016-05-12 DIAGNOSIS — M6281 Muscle weakness (generalized): Secondary | ICD-10-CM

## 2016-05-12 NOTE — Addendum Note (Signed)
Addended by: Jerl Mina on: 05/12/2016 04:57 PM   Modules accepted: Orders

## 2016-05-12 NOTE — Patient Instructions (Addendum)
STRETCHES: Open book 15 reps  both directions seated    Praise the Lord stretch 10 x  Seated with cane reaching overheard thumbs curled    Strengthening: Yellow band under both feet: Drawing the sword (pulling from opposite side)  10 x 3 x day  Both sides     Mini squat  10 x 3 x day (keep your knees behind toes)    Referral to Pulmonary Rehab Program

## 2016-05-12 NOTE — Therapy (Signed)
[place] MAIN [facility] SERVICES [address] [place], [place], [address] Phone: [phone]   Fax:  [phone]  Physical Therapy Treatment  Patient Details  Name: [name] MRN: [id number] Date of Birth: [birth year] Referring Provider: Darrick Grinder  Encounter Date: 05/12/2016      PT End of Session - 05/12/16 1712    Visit Number 2   Number of Visits 12   PT Start Time 0806   PT Stop Time 0900   PT Time Calculation (min) 54 min   Activity Tolerance Patient tolerated treatment well;No increased pain   Behavior During Therapy [facility] for tasks assessed/performed      Past Medical History  Diagnosis Date  . COPD (chronic obstructive pulmonary disease) (HCC)     COPD  . Prostate CA [facility])     prostate ca dx 57 ys ago;  . Prostate CA [facility])     prostate ca dx 20 yrs ago  . Hypertension   . Hyperlipidemia   . Alcoholism ([place])   . PVD (peripheral vascular disease) ([place])   . Degenerative joint disease   . Bipolar affective disorder ([place])   . Tobacco abuse   . Coronary artery disease February 2005    PCI and Taxus drug-eluting stent placement to the distal RCA (3.5 x 12 mm)  . Essential hypertension   . Asthma     Past Surgical History  Procedure Laterality Date  . Total hip arthroplasty      right  . Cardiac catheterization    . Coronary angioplasty with stent placement      There were no vitals filed for this visit.      Subjective Assessment - 05/12/16 1702    Subjective Pt reports since he has been doing the exercise (rainbow stretch), he has not had any constipation, smoother bowel movements without straining. Pt states he breathes and waits for the bowel movements and does not push his stomach muscles. "Everything is improving. It 's like night and day. I do  not notice any leakage anymore. My energy level level is up by 100%"    Patient is accompained by: Family member  daughter    Pertinent History uses a SPC, relies on dtr  on transportation.             [facility] PT Assessment - 05/12/16 1705    Assessment   Medical Diagnosis constipation   Referring Provider Scarlett   Precautions   Precautions None   Restrictions   Weight Bearing Restrictions No   Balance Screen   Has the patient fallen in the past 6 months No   [place] residence   Prior Function   Level of Independence Independent with basic ADLs  SPC in the community, not in the home    Observation/Other Assessments   Other Surveys  --  12%  COREFO    Other:   Other/ Comments cued for decreased upper trap overactivation with resisted exercises   AROM   Overall AROM Comments trunk rotation and sideflexion B WFL                      [facility] Adult PT Treatment/Exercise - 05/12/16 1705    Ambulation/Gait   Gait Comments --   Posture/Postural Control   Posture/Postural Control --   Neuro Re-ed    Neuro Re-ed Details  see pt instructions   Exercises   Exercises --  see pt instructions  PT Education - 05/12/16 1710    Education provided Yes   Education Details HEP   Person(s) Educated Patient   Methods Explanation;Tactile cues;Demonstration;Verbal cues;Handout   Comprehension Verbalized understanding;Returned demonstration             PT Long Term Goals - 05/12/16 1715    PT LONG TERM GOAL #1   Title Pt will report decreased urinary incontinence by 50% in order to restore pelvic floor function   Time 12   Period Weeks   Status Achieved   PT LONG TERM GOAL #2   Title Pt will report decreased dependence on stool softener and abdominal straining by 50% and  in order to restore GI and pelvic floor function    Time 12   Period Weeks   Status Achieved   PT LONG TERM GOAL #3   Title Pt will demo proper diaphragmatic breathing with pelvic floor in order to minimize straining on pelvic floor with toileting   Time 12   Period Weeks   Status Achieved   PT LONG  TERM GOAL #4   Title Pt will demo increased spinal rotation from ~20 deg trunk rotation bilaterally to >40 deg and  side flexion B from 30% to ~45 % in order to optimize diaphragmatic excursion for health.   Time 12   Period Weeks   Status Achieved   PT LONG TERM GOAL #5   Title Pt 's COREFO score will decrease from 21% to < 15% in order to normalize colorectal function.    Time 12   Period Weeks   Status Achieved   Additional Long Term Goals   Additional Long Term Goals Yes   PT LONG TERM GOAL #6   Title Pt will be IND with HEP to continue improving with his Sx   Time 12   Period Weeks   Status New               Plan - 05/12/16 1712    Clinical Impression Statement Pt has achieved 5/6 goals. He decreased his COREFO score significantly from 21% to 12% and reported he is no longer straining and has regular bowel movements. Pt also stated he has noticed he no longer has urinary leakage. Pt finds his HEP to be very helpful and his energy has improved as well. Pt was educated on progression of deep core mm  strengthening. Pt was introduced to the Pulmonary Rehab Program at [facility]  and educated about utilization this program for continued health and wellness. Pt expressed motivation to lose weight and to learn how to exercise. Pt and dtr were provided information about the program. Pt will return for one more visit before d/c.     Rehab Potential Good   PT Frequency 1x / week   PT Duration 12 weeks   PT Treatment/Interventions ADLs/Self Care Home Management;Aquatic Therapy;Gait training;Stair training;Moist Heat;Traction;Functional mobility training;Therapeutic activities;Therapeutic exercise;Balance training;Neuromuscular re-education;Manual techniques;Patient/family education;Scar mobilization;Passive range of motion;Energy conservation;Taping;Manual lymph drainage   Consulted and Agree with Plan of Care Patient      Patient will benefit from skilled therapeutic intervention in order  to improve the following deficits and impairments:  Abnormal gait, Decreased coordination, Difficulty walking, Decreased range of motion, Decreased safety awareness, Decreased balance, Decreased strength, Improper body mechanics, Other (comment), Decreased mobility, Decreased activity tolerance, Increased muscle spasms, Obesity, Decreased endurance, Postural dysfunction  Visit Diagnosis: Other abnormalities of gait and mobility  Unspecified lack of coordination  Muscle weakness (generalized)     Problem  List Patient Active Problem List   Diagnosis Date Noted  . Exertional dyspnea 02/11/2016  . Hyperlipidemia 12/03/2015  . Essential hypertension   . Coronary artery disease 01/19/2004    Jerl Mina ,PT, DPT, E-RYT  05/12/2016, 5:17 PM  [place] MAIN [facility] SERVICES [address] [place], [place], [address] Phone: [phone]   Fax:  [phone]  Name: OLANDO ROZZELL MRN: [id number] Date of Birth: 27-Apr-1935

## 2016-05-17 ENCOUNTER — Ambulatory Visit: Payer: Medicare Other | Admitting: Physical Therapy

## 2016-05-17 DIAGNOSIS — R279 Unspecified lack of coordination: Secondary | ICD-10-CM

## 2016-05-17 DIAGNOSIS — R2689 Other abnormalities of gait and mobility: Secondary | ICD-10-CM

## 2016-05-17 DIAGNOSIS — M6281 Muscle weakness (generalized): Secondary | ICD-10-CM

## 2016-05-17 NOTE — Therapy (Signed)
[place] MAIN [facility] SERVICES [place], [place], [address] Phone: [phone]   Fax:  [phone]  Physical Therapy Treatment /Discharge Summary  Patient Details  Name: [name] MRN: [id number] Date of Birth: 10/14/1935 Referring Provider: Darrick Grinder  Encounter Date: 05/17/2016      PT End of Session - 05/17/16 1007    Visit Number 3   Number of Visits 12   PT Start Time 1005   PT Stop Time [id number]   PT Time Calculation (min) 40 min   Activity Tolerance Patient tolerated treatment well;No increased pain   Behavior During Therapy [facility] for tasks assessed/performed      Past Medical History  Diagnosis Date  . COPD (chronic obstructive pulmonary disease) (HCC)     COPD  . Prostate CA [facility])     prostate ca dx 73 ys ago;  . Prostate CA [facility])     prostate ca dx 20 yrs ago  . Hypertension   . Hyperlipidemia   . Alcoholism ([place])   . PVD (peripheral vascular disease) ([place])   . Degenerative joint disease   . Bipolar affective disorder ([place])   . Tobacco abuse   . Coronary artery disease February 2005    PCI and Taxus drug-eluting stent placement to the distal RCA (3.5 x 12 mm)  . Essential hypertension   . Asthma     Past Surgical History  Procedure Laterality Date  . Total hip arthroplasty      right  . Cardiac catheterization    . Coronary angioplasty with stent placement      There were no vitals filed for this visit.                                    PT Long Term Goals - 05/17/16 1007    PT LONG TERM GOAL #1   Title Pt will report decreased urinary incontinence by 50% in order to restore pelvic floor function   Time 12   Period Weeks   Status Achieved   PT LONG TERM GOAL #2   Title Pt will report decreased dependence on stool softener and abdominal straining by 50% and  in order to restore GI and pelvic floor function    Time 12   Period Weeks   Status Achieved   PT LONG TERM  GOAL #3   Title Pt will demo proper diaphragmatic breathing with pelvic floor in order to minimize straining on pelvic floor with toileting   Time 12   Period Weeks   Status Achieved   PT LONG TERM GOAL #4   Title Pt will demo increased spinal rotation from ~20 deg trunk rotation bilaterally to >40 deg and  side flexion B from 30% to ~45 % in order to optimize diaphragmatic excursion for health.   Time 12   Period Weeks   Status Achieved   PT LONG TERM GOAL #5   Title Pt 's COREFO score will decrease from 21% to < 15% in order to normalize colorectal function.    Time 12   Period Weeks   Status Achieved   PT LONG TERM GOAL #6   Title Pt will be IND with HEP to continue improving with his Sx   Time 12   Period Weeks   Status Achieved               Plan -  19-May-2016 1008    Clinical Impression Statement Pt reports since starting PT, pt's constipation has improved "A very greast deal better" according to the Global Rate of Change. Pt also reports his bowel movements occur once daily without straining. Pt also staes that the breathing coordination exercises have also helped his energy level as well.  .     Rehab Potential Good   PT Frequency 1x / week   PT Duration 12 weeks   PT Treatment/Interventions ADLs/Self Care Home Management;Aquatic Therapy;Gait training;Stair training;Moist Heat;Traction;Functional mobility training;Therapeutic activities;Therapeutic exercise;Balance training;Neuromuscular re-education;Manual techniques;Patient/family education;Scar mobilization;Passive range of motion;Energy conservation;Taping;Manual lymph drainage   Consulted and Agree with Plan of Care Patient      Patient will benefit from skilled therapeutic intervention in order to improve the following deficits and impairments:  Abnormal gait, Decreased coordination, Difficulty walking, Decreased range of motion, Decreased safety awareness, Decreased balance, Decreased strength, Improper body  mechanics, Other (comment), Decreased mobility, Decreased activity tolerance, Increased muscle spasms, Obesity, Decreased endurance, Postural dysfunction  Visit Diagnosis: Other abnormalities of gait and mobility  Muscle weakness (generalized)  Unspecified lack of coordination       G-Codes - 2016/05/19 1941    Functional Assessment Tool Used COREFO 0%, clinical judgement,    Functional Limitation Mobility: Walking and moving around   Mobility: Walking and Moving Around Current Status [phone]) At least 1 percent but less than 20 percent impaired, limited or restricted   Mobility: Walking and Moving Around Goal Status [phone]) 0 percent impaired, limited or restricted   Mobility: Walking and Moving Around Discharge Status [phone]) 0 percent impaired, limited or restricted      Problem List Patient Active Problem List   Diagnosis Date Noted  . Exertional dyspnea 02/11/2016  . Hyperlipidemia 12/03/2015  . Essential hypertension   . Coronary artery disease 01/19/2004    Jerl Mina ,PT, DPT, E-RYT 19-May-2016, 7:42 PM  [place] MAIN [facility] SERVICES [address] [place], [place], [address] Phone: [phone]   Fax:  [phone]  Name: [name] MRN: [id number] Date of Birth: 09/30/1935

## 2016-05-24 ENCOUNTER — Encounter: Payer: Medicare Other | Admitting: Physical Therapy

## 2016-05-30 ENCOUNTER — Encounter: Payer: Medicare Other | Attending: Specialist | Admitting: Respiratory Therapy

## 2016-05-30 DIAGNOSIS — J449 Chronic obstructive pulmonary disease, unspecified: Secondary | ICD-10-CM

## 2016-05-30 DIAGNOSIS — G4730 Sleep apnea, unspecified: Secondary | ICD-10-CM | POA: Insufficient documentation

## 2016-05-30 NOTE — Progress Notes (Signed)
Pulmonary Individual Treatment Plan  Patient Details  Name: [name] MRN: [id number] Date of Birth: 1935-04-29 Referring Provider:        Pulmonary Rehab from 05/30/2016 in [facility] Cardiac and Pulmonary Rehab   Referring Provider  Raul Del      Initial Encounter Date:       Pulmonary Rehab from 05/30/2016 in [facility] Cardiac and Pulmonary Rehab   Date  05/30/16   Referring Provider  Raul Del      Visit Diagnosis: Sleep apnea  COPD, mild ([place])  Morbid obesity due to excess calories ([place])  Patient's Home Medications on Admission:  Current outpatient prescriptions:    albuterol (PROAIR HFA) 108 (90 Base) MCG/ACT inhaler, Inhale 2 puffs into the lungs every 6 (six) hours as needed. Reported on 04/21/2016, Disp: , Rfl:    aspirin 81 MG tablet, Take 81 mg by mouth daily. , Disp: , Rfl:    atorvastatin (LIPITOR) 40 MG tablet, Take 1 tablet (40 mg total) by mouth daily., Disp: 30 tablet, Rfl: 3   BREO ELLIPTA 100-25 MCG/INH AEPB, Inhale 1 puff into the lungs daily. Reported on 04/21/2016, Disp: , Rfl:    docusate sodium (COLACE) 50 MG capsule, Take 50 mg by mouth 2 (two) times daily., Disp: , Rfl:    fluticasone-salmeterol (ADVAIR HFA) 115-21 MCG/ACT inhaler, Inhale 2 puffs into the lungs 2 (two) times daily., Disp: , Rfl:    furosemide (LASIX) 20 MG tablet, Take 20 mg by mouth., Disp: , Rfl:    HYDROcodone-acetaminophen (NORCO/VICODIN) 5-325 MG tablet, Take 1-2 tablets by mouth every 6 (six) hours as needed for moderate pain. (Patient not taking: Reported on 04/21/2016), Disp: 30 tablet, Rfl: 0   levothyroxine (SYNTHROID, LEVOTHROID) 50 MCG tablet, Take 50 mcg by mouth daily., Disp: , Rfl:    losartan (COZAAR) 100 MG tablet, Take 1 tablet (100 mg total) by mouth daily., Disp: 30 tablet, Rfl: 3   nitroGLYCERIN (NITROSTAT) 0.4 MG SL tablet, Take 0.4 mg by mouth daily as needed. As needed for chest pain, Disp: , Rfl:    ondansetron (ZOFRAN ODT) 4 MG disintegrating tablet, Take 1 tablet (4  mg total) by mouth every 6 (six) hours as needed for nausea or vomiting., Disp: 20 tablet, Rfl: 0  Past Medical History: Past Medical History  Diagnosis Date   COPD (chronic obstructive pulmonary disease) ([place])     COPD   Prostate CA ([place])     prostate ca dx 41 ys ago;   Prostate CA ([place])     prostate ca dx 20 yrs ago   Hypertension    Hyperlipidemia    Alcoholism ([place])    PVD (peripheral vascular disease) ([place])    Degenerative joint disease    Bipolar affective disorder ([place])    Tobacco abuse    Coronary artery disease February 2005    PCI and Taxus drug-eluting stent placement to the distal RCA (3.5 x 12 mm)   Essential hypertension    Asthma     Tobacco Use: History  Smoking status   Former Smoker -- 1.00 packs/day for 50 years   Types: Cigarettes  Smokeless tobacco   Not on file    Labs: Recent Review Flowsheet Data    Labs for ITP Cardiac and Pulmonary Rehab Latest Ref Rng 10/30/2010 01/04/2011 05/31/2014 03/02/2016 04/26/2016   Cholestrol 100 - 199 mg/dL 161 ATP III CLASSIFICATION: <200     mg/dL   Desirable 200-239  mg/dL   Borderline High >=240    mg/dL  High - 138 218(H) 189   LDLCALC 0 - 99 mg/dL 78 Total Cholesterol/HDL:CHD Risk Coronary Heart Disease Risk Table Men   Women 1/2 Average Risk   3.4   3.3 Average Risk       5.0   4.4 2 X Average Risk   9.6   7.1 3 X Average Risk  23.4   11.0 Use the calculated Patient Ratio above and the CHD Risk Table to determine the patient's CHD Risk. ATP III CLASSIFICATION (LDL): <100     mg/dL   Optimal 100-129  mg/dL   Near or Above Optimal 130-159  mg/dL   Borderline 160-189  mg/dL   High >190     mg/dL   Very High - 83 135(H) 97   HDL >39 mg/dL 71 - 40 58 66   Trlycerides 0 - 149 mg/dL 60 - 75 124 129   TCO2 0 - 100 mmol/L - 26 - - -       ADL UCSD:     Pulmonary Assessment Scores      05/30/16 0900       ADL UCSD   ADL Phase Entry     SOB Score total 49     Rest 1     Walk 3      Stairs 4     Bath 2     Dress 2     Shop 3        Pulmonary Function Assessment:     Pulmonary Function Assessment - 05/30/16 0900    Pulmonary Function Tests   FVC% 87 %   FEV1% 83 %   FEV1/FVC Ratio 73   RV% 80 %   DLCO% 60 %   Initial Spirometry Results   Comments PFT performed on 12/16/2015   Breath   Bilateral Breath Sounds Clear   Shortness of Breath Yes;Fear of Shortness of Breath;Limiting activity      Exercise Target Goals: Date: 05/30/16  Exercise Program Goal: Individual exercise prescription set with THRR, safety & activity barriers. Participant demonstrates ability to understand and report RPE using BORG scale, to self-measure pulse accurately, and to acknowledge the importance of the exercise prescription.  Exercise Prescription Goal: Starting with aerobic activity 30 plus minutes a day, 3 days per week for initial exercise prescription. Provide home exercise prescription and guidelines that participant acknowledges understanding prior to discharge.  Activity Barriers & Risk Stratification:     Activity Barriers & Cardiac Risk Stratification - 05/30/16 0900    Activity Barriers & Cardiac Risk Stratification   Activity Barriers Shortness of Breath;Deconditioning;Muscular Weakness   Cardiac Risk Stratification Moderate      6 Minute Walk:     6 Minute Walk      05/30/16 1535       6 Minute Walk   Distance 533 feet     Walk Time 4 minutes     # of Rest Breaks 1  stopped at 4:00     MPH 1.5     METS 2.1     RPE 18     Symptoms Yes (comment)     Comments overall fatigue - legs and breathing     Resting HR 67 bpm     Resting BP 154/70 mmHg     Max Ex. HR 123 bpm     Max Ex. BP 160/70 mmHg     2 Minute Post BP 154/72 mmHg     Interval HR   Baseline HR 67  1 Minute HR 105     2 Minute HR 108     3 Minute HR 117     4 Minute HR 123     2 Minute Post HR 72     Interval Heart Rate? Yes     Interval Oxygen   Interval Oxygen? Yes      Baseline Oxygen Saturation % 98 %     1 Minute Oxygen Saturation % 96 %     2 Minute Oxygen Saturation % 96 %     3 Minute Oxygen Saturation % 93 %     4 Minute Oxygen Saturation % 93 %     2 Minute Post Oxygen Saturation % 98 %        Initial Exercise Prescription:     Initial Exercise Prescription - 05/30/16 1500    Date of Initial Exercise RX and Referring Provider   Date 05/30/16   Referring Provider Raul Del   Treadmill   MPH 1.3   Grade 0   Minutes 15  4/4/4   METs 2   Recumbant Elliptical   Level 1   Watts 50   Minutes 15  can do 4/4/4    METs 2.1   T5 Nustep   Level 1   Watts 30   Minutes 15  4/4/4   METs 2.1   Prescription Details   Frequency (times per week) 3   Duration Progress to 45 minutes of aerobic exercise without signs/symptoms of physical distress   Intensity   THRR 40-80% of Max Heartrate 97-126   Ratings of Perceived Exertion 11-13   Progression   Progression Continue to progress workloads to maintain intensity without signs/symptoms of physical distress.   Resistance Training   Training Prescription Yes   Weight 3   Reps 10-15      Perform Capillary Blood Glucose checks as needed.  Exercise Prescription Changes:   Exercise Comments:   Discharge Exercise Prescription (Final Exercise Prescription Changes):    Nutrition:  Target Goals: Understanding of nutrition guidelines, daily intake of sodium 1500mg , cholesterol 200mg , calories 30% from fat and 7% or less from saturated fats, daily to have 5 or more servings of fruits and vegetables.  Biometrics:    Nutrition Therapy Plan and Nutrition Goals:     Nutrition Therapy & Goals - 05/30/16 0900    Nutrition Therapy   Diet Mr Polak would like to consult with the dietitian. He does all the cooking, cooks green vegtables,low salt, and likes sweets.       Nutrition Discharge: Rate Your Plate Scores:   Psychosocial: Target Goals: Acknowledge presence or absence of depression,  maximize coping skills, provide positive support system. Participant is able to verbalize types and ability to use techniques and skills needed for reducing stress and depression.  Initial Review & Psychosocial Screening:     Initial Psych Review & Screening - 05/30/16 0900    Initial Review   Current issues with History of Depression   Family Dynamics   Good Support System? Yes   Comments Good support from his family; long history of manic depression; has been admitted to an institution for 8 months, 8 years ago - it was successful therapy and is not on medication at this time.   Barriers   Psychosocial barriers to participate in program The patient should benefit from training in stress management and relaxation.   Screening Interventions   Interventions Encouraged to exercise;Program counselor consult      Quality of  Life Scores:     Quality of Life - 05/30/16 0900    Quality of Life Scores   Health/Function Pre 20.25 %   Socioeconomic Pre 19.33 %   Psych/Spiritual Pre 19.33 %   Family Pre 20.6 %   GLOBAL Pre 19.97 %      PHQ-9:     Recent Review Flowsheet Data    Depression screen [facility] 2/9 05/30/2016   Decreased Interest 3   Down, Depressed, Hopeless 2   PHQ - 2 Score 5   Altered sleeping 3   Tired, decreased energy 3   Change in appetite 1   Feeling bad or failure about yourself  1   Trouble concentrating 0   Moving slowly or fidgety/restless 0   Suicidal thoughts 0   PHQ-9 Score 13   Difficult doing work/chores Somewhat difficult      Psychosocial Evaluation and Intervention:   Psychosocial Re-Evaluation:  Education: Education Goals: Education classes will be provided on a weekly basis, covering required topics. Participant will state understanding/return demonstration of topics presented.  Learning Barriers/Preferences:     Learning Barriers/Preferences - 05/30/16 0900    Learning Barriers/Preferences   Learning Barriers None   Learning  Preferences Group Instruction;Individual Instruction;Pictoral;Skilled Demonstration;Verbal Instruction;Video;Written Material      Education Topics: Initial Evaluation Education: - Verbal, written and demonstration of respiratory meds, RPE/PD scales, oximetry and breathing techniques. Instruction on use of nebulizers and MDIs: cleaning and proper use, rinsing mouth with steroid doses and importance of monitoring MDI activations.          Pulmonary Rehab from 05/30/2016 in [facility] Cardiac and Pulmonary Rehab   Date  05/30/16   Educator  LB   Instruction Review Code  2- meets goals/outcomes      General Nutrition Guidelines/Fats and Fiber: -Group instruction provided by verbal, written material, models and posters to present the general guidelines for heart healthy nutrition. Gives an explanation and review of dietary fats and fiber.   Controlling Sodium/Reading Food Labels: -Group verbal and written material supporting the discussion of sodium use in heart healthy nutrition. Review and explanation with models, verbal and written materials for utilization of the food label.   Exercise Physiology & Risk Factors: - Group verbal and written instruction with models to review the exercise physiology of the cardiovascular system and associated critical values. Details cardiovascular disease risk factors and the goals associated with each risk factor.   Aerobic Exercise & Resistance Training: - Gives group verbal and written discussion on the health impact of inactivity. On the components of aerobic and resistive training programs and the benefits of this training and how to safely progress through these programs.   Flexibility, Balance, General Exercise Guidelines: - Provides group verbal and written instruction on the benefits of flexibility and balance training programs. Provides general exercise guidelines with specific guidelines to those with heart or lung disease. Demonstration and skill  practice provided.   Stress Management: - Provides group verbal and written instruction about the health risks of elevated stress, cause of high stress, and healthy ways to reduce stress.   Depression: - Provides group verbal and written instruction on the correlation between heart/lung disease and depressed mood, treatment options, and the stigmas associated with seeking treatment.   Exercise & Equipment Safety: - Individual verbal instruction and demonstration of equipment use and safety with use of the equipment.   Infection Prevention: - Provides verbal and written material to individual with discussion of infection control including proper hand washing  and proper equipment cleaning during exercise session.      Pulmonary Rehab from 05/30/2016 in [facility] Cardiac and Pulmonary Rehab   Date  05/30/16   Educator  LB   Instruction Review Code  2- meets goals/outcomes      Falls Prevention: - Provides verbal and written material to individual with discussion of falls prevention and safety.      Pulmonary Rehab from 05/30/2016 in [facility] Cardiac and Pulmonary Rehab   Date  05/30/16   Educator  LB   Instruction Review Code  2- meets goals/outcomes      Diabetes: - Individual verbal and written instruction to review signs/symptoms of diabetes, desired ranges of glucose level fasting, after meals and with exercise. Advice that pre and post exercise glucose checks will be done for 3 sessions at entry of program.   Chronic Lung Diseases: - Group verbal and written instruction to review new updates, new respiratory medications, new advancements in procedures and treatments. Provide informative websites and "800" numbers of self-education.   Lung Procedures: - Group verbal and written instruction to describe testing methods done to diagnose lung disease. Review the outcome of test results. Describe the treatment choices: Pulmonary Function Tests, ABGs and oximetry.   Energy  Conservation: - Provide group verbal and written instruction for methods to conserve energy, plan and organize activities. Instruct on pacing techniques, use of adaptive equipment and posture/positioning to relieve shortness of breath.   Triggers: - Group verbal and written instruction to review types of environmental controls: home humidity, furnaces, filters, dust mite/pet prevention, HEPA vacuums. To discuss weather changes, air quality and the benefits of nasal washing.   Exacerbations: - Group verbal and written instruction to provide: warning signs, infection symptoms, calling MD promptly, preventive modes, and value of vaccinations. Review: effective airway clearance, coughing and/or vibration techniques. Create an Sports administrator.   Oxygen: - Individual and group verbal and written instruction on oxygen therapy. Includes supplement oxygen, available portable oxygen systems, continuous and intermittent flow rates, oxygen safety, concentrators, and Medicare reimbursement for oxygen.   Respiratory Medications: - Group verbal and written instruction to review medications for lung disease. Drug class, frequency, complications, importance of spacers, rinsing mouth after steroid MDI's, and proper cleaning methods for nebulizers.      Pulmonary Rehab from 05/30/2016 in [facility] Cardiac and Pulmonary Rehab   Date  05/30/16   Educator  LB   Instruction Review Code  2- meets goals/outcomes      AED/CPR: - Group verbal and written instruction with the use of models to demonstrate the basic use of the AED with the basic ABC's of resuscitation.   Breathing Retraining: - Provides individuals verbal and written instruction on purpose, frequency, and proper technique of diaphragmatic breathing and pursed-lipped breathing. Applies individual practice skills.      Pulmonary Rehab from 05/30/2016 in [facility] Cardiac and Pulmonary Rehab   Date  05/30/16   Educator  LB   Instruction Review Code  2- meets  goals/outcomes      Anatomy and Physiology of the Lungs: - Group verbal and written instruction with the use of models to provide basic lung anatomy and physiology related to function, structure and complications of lung disease.   Heart Failure: - Group verbal and written instruction on the basics of heart failure: signs/symptoms, treatments, explanation of ejection fraction, enlarged heart and cardiomyopathy.   Sleep Apnea: - Individual verbal and written instruction to review Obstructive Sleep Apnea. Review of risk factors, methods for diagnosing and  types of masks and machines for OSA.      Pulmonary Rehab from 05/30/2016 in [facility] Cardiac and Pulmonary Rehab   Date  05/30/16   Educator  LB   Instruction Review Code  2- meets goals/outcomes      Anxiety: - Provides group, verbal and written instruction on the correlation between heart/lung disease and anxiety, treatment options, and management of anxiety.   Relaxation: - Provides group, verbal and written instruction about the benefits of relaxation for patients with heart/lung disease. Also provides patients with examples of relaxation techniques.   Knowledge Questionnaire Score:     Knowledge Questionnaire Score - 05/30/16 0900    Knowledge Questionnaire Score   Pre Score 9/10       Core Components/Risk Factors/Patient Goals at Admission:     Personal Goals and Risk Factors at Admission - 05/30/16 0900    Core Components/Risk Factors/Patient Goals on Admission    Weight Management Yes;Obesity   Intervention Weight Management: Develop a combined nutrition and exercise program designed to reach desired caloric intake, while maintaining appropriate intake of nutrient and fiber, sodium and fats, and appropriate energy expenditure required for the weight goal.;Weight Management: Provide education and appropriate resources to help participant work on and attain dietary goals.;Weight Management/Obesity: Establish reasonable  short term and long term weight goals.;Obesity: Provide education and appropriate resources to help participant work on and attain dietary goals.  Mr Wagley would like to consult with the dietitian. He does all the cooking, cooks green vegtables,low salt, and likes sweets.   Admit Weight 308 lb 14.4 oz (140.116 kg)   Goal Weight: Short Term 298 lb (135.172 kg)   Goal Weight: Long Term 160 lb (72.576 kg)   Expected Outcomes Short Term: Continue to assess and modify interventions until short term weight is achieved;Long Term: Adherence to nutrition and physical activity/exercise program aimed toward attainment of established weight goal;Weight Maintenance: Understanding of the daily nutrition guidelines, which includes 25-35% calories from fat, 7% or less cal from saturated fats, less than 200mg  cholesterol, less than 1.5gm of sodium, & 5 or more servings of fruits and vegetables daily;Weight Loss: Understanding of general recommendations for a balanced deficit meal plan, which promotes 1-2 lb weight loss per week and includes a negative energy balance of [phone] kcal/d;Understanding recommendations for meals to include 15-35% energy as protein, 25-35% energy from fat, 35-60% energy from carbohydrates, less than 200mg  of dietary cholesterol, 20-35 gm of total fiber daily;Understanding of distribution of calorie intake throughout the day with the consumption of 4-5 meals/snacks   Sedentary Yes   Intervention Provide advice, education, support and counseling about physical activity/exercise needs.;Develop an individualized exercise prescription for aerobic and resistive training based on initial evaluation findings, risk stratification, comorbidities and participant's personal goals.   Expected Outcomes Achievement of increased cardiorespiratory fitness and enhanced flexibility, muscular endurance and strength shown through measurements of functional capacity and personal statement of participant.   Increase  Strength and Stamina Yes   Intervention Provide advice, education, support and counseling about physical activity/exercise needs.;Develop an individualized exercise prescription for aerobic and resistive training based on initial evaluation findings, risk stratification, comorbidities and participant's personal goals.   Expected Outcomes Achievement of increased cardiorespiratory fitness and enhanced flexibility, muscular endurance and strength shown through measurements of functional capacity and personal statement of participant.   Improve shortness of breath with ADL's Yes   Intervention Provide education, individualized exercise plan and daily activity instruction to help decrease symptoms of SOB with activities of  daily living.   Expected Outcomes Short Term: Achieves a reduction of symptoms when performing activities of daily living.   Develop more efficient breathing techniques such as purse lipped breathing and diaphragmatic breathing; and practicing self-pacing with activity Yes   Intervention Provide education, demonstration and support about specific breathing techniuqes utilized for more efficient breathing. Include techniques such as pursed lipped breathing, diaphragmatic breathing and self-pacing activity.   Expected Outcomes Short Term: Participant will be able to demonstrate and use breathing techniques as needed throughout daily activities.   Increase knowledge of respiratory medications and ability to use respiratory devices properly  Yes  Mr Mccrimon is on Advair MDI and Albuterol MDI. Instructed him in use of spacer. Wears full face mask with CPAP; 16cmH2O; compliant.   Intervention Provide education and demonstration as needed of appropriate use of medications, inhalers, and oxygen therapy.   Expected Outcomes Short Term: Achieves understanding of medications use. Understands that oxygen is a medication prescribed by physician. Demonstrates appropriate use of inhaler and oxygen therapy.    Hypertension Yes   Intervention Provide education on lifestyle modifcations including regular physical activity/exercise, weight management, moderate sodium restriction and increased consumption of fresh fruit, vegetables, and low fat dairy, alcohol moderation, and smoking cessation.;Monitor prescription use compliance.   Expected Outcomes Short Term: Continued assessment and intervention until BP is < 140/65mm HG in hypertensive participants. < 130/49mm HG in hypertensive participants with diabetes, heart failure or chronic kidney disease.;Long Term: Maintenance of blood pressure at goal levels.      Core Components/Risk Factors/Patient Goals Review:    Core Components/Risk Factors/Patient Goals at Discharge (Final Review):    ITP Comments:   Comments: Mr Debroux plans to start [facility] on 06/07/2016 and attend 3 days/week.

## 2016-05-30 NOTE — Patient Instructions (Signed)
Patient Instructions  Patient Details  Name: [name] MRN: [id number] Date of Birth: [birth year] Referring Provider:  Erby Pian, MD  Below are the personal goals you chose as well as exercise and nutrition goals. Our goal is to help you keep on track towards obtaining and maintaining your goals. We will be discussing your progress on these goals with you throughout the program.  Initial Exercise Prescription:     Initial Exercise Prescription - 05/30/16 1500    Date of Initial Exercise RX and Referring Provider   Date 05/30/16   Referring Provider [name]   Treadmill   MPH 1.3   Grade 0   Minutes 15  4/4/4   METs 2   Recumbant Elliptical   Level 1   Watts 50   Minutes 15  can do 4/4/4    METs 2.1   T5 Nustep   Level 1   Watts 30   Minutes 15  4/4/4   METs 2.1   Prescription Details   Frequency (times per week) 3   Duration Progress to 45 minutes of aerobic exercise without signs/symptoms of physical distress   Intensity   THRR 40-80% of Max Heartrate 97-126   Ratings of Perceived Exertion 11-13   Progression   Progression Continue to progress workloads to maintain intensity without signs/symptoms of physical distress.   Resistance Training   Training Prescription Yes   Weight 3   Reps 10-15      Exercise Goals: Frequency: Be able to perform aerobic exercise three times per week working toward 3-5 days per week.  Intensity: Work with a perceived exertion of 11 (fairly light) - 15 (hard) as tolerated. Follow your new exercise prescription and watch for changes in prescription as you progress with the program. Changes will be reviewed with you when they are made.  Duration: You should be able to do 30 minutes of continuous aerobic exercise in addition to a 5 minute warm-up and a 5 minute cool-down routine.  Nutrition Goals: Your personal nutrition goals will be established when you do your nutrition analysis with the dietician.  The following are  nutrition guidelines to follow: Cholesterol < 200mg /day Sodium < 1500mg /day Fiber: Men over 50 yrs - 30 grams per day  Personal Goals:     Personal Goals and Risk Factors at Admission - 05/30/16 0900    Core Components/Risk Factors/Patient Goals on Admission    Weight Management Yes;Obesity   Intervention Weight Management: Develop a combined nutrition and exercise program designed to reach desired caloric intake, while maintaining appropriate intake of nutrient and fiber, sodium and fats, and appropriate energy expenditure required for the weight goal.;Weight Management: Provide education and appropriate resources to help participant work on and attain dietary goals.;Weight Management/Obesity: Establish reasonable short term and long term weight goals.;Obesity: Provide education and appropriate resources to help participant work on and attain dietary goals.  [name] would like to consult with the dietitian. He does all the cooking, cooks green vegtables,low salt, and likes sweets.   Admit Weight 308 lb 14.4 oz (140.116 kg)   Goal Weight: Short Term 298 lb (135.172 kg)   Goal Weight: Long Term 160 lb (72.576 kg)   Expected Outcomes Short Term: Continue to assess and modify interventions until short term weight is achieved;Long Term: Adherence to nutrition and physical activity/exercise program aimed toward attainment of established weight goal;Weight Maintenance: Understanding of the daily nutrition guidelines, which includes 25-35% calories from fat, 7% or less cal from saturated fats,  less than 200mg  cholesterol, less than 1.5gm of sodium, & 5 or more servings of fruits and vegetables daily;Weight Loss: Understanding of general recommendations for a balanced deficit meal plan, which promotes 1-2 lb weight loss per week and includes a negative energy balance of [phone] kcal/d;Understanding recommendations for meals to include 15-35% energy as protein, 25-35% energy from fat, 35-60% energy from  carbohydrates, less than 200mg  of dietary cholesterol, 20-35 gm of total fiber daily;Understanding of distribution of calorie intake throughout the day with the consumption of 4-5 meals/snacks   Sedentary Yes   Intervention Provide advice, education, support and counseling about physical activity/exercise needs.;Develop an individualized exercise prescription for aerobic and resistive training based on initial evaluation findings, risk stratification, comorbidities and participant's personal goals.   Expected Outcomes Achievement of increased cardiorespiratory fitness and enhanced flexibility, muscular endurance and strength shown through measurements of functional capacity and personal statement of participant.   Increase Strength and Stamina Yes   Intervention Provide advice, education, support and counseling about physical activity/exercise needs.;Develop an individualized exercise prescription for aerobic and resistive training based on initial evaluation findings, risk stratification, comorbidities and participant's personal goals.   Expected Outcomes Achievement of increased cardiorespiratory fitness and enhanced flexibility, muscular endurance and strength shown through measurements of functional capacity and personal statement of participant.   Improve shortness of breath with ADL's Yes   Intervention Provide education, individualized exercise plan and daily activity instruction to help decrease symptoms of SOB with activities of daily living.   Expected Outcomes Short Term: Achieves a reduction of symptoms when performing activities of daily living.   Develop more efficient breathing techniques such as purse lipped breathing and diaphragmatic breathing; and practicing self-pacing with activity Yes   Intervention Provide education, demonstration and support about specific breathing techniuqes utilized for more efficient breathing. Include techniques such as pursed lipped breathing, diaphragmatic  breathing and self-pacing activity.   Expected Outcomes Short Term: Participant will be able to demonstrate and use breathing techniques as needed throughout daily activities.   Increase knowledge of respiratory medications and ability to use respiratory devices properly  Yes  Nicholas Ty is on Advair MDI and Albuterol MDI. Instructed him in use of spacer. Wears full face mask with CPAP; 16cmH2O; compliant.   Intervention Provide education and demonstration as needed of appropriate use of medications, inhalers, and oxygen therapy.   Expected Outcomes Short Term: Achieves understanding of medications use. Understands that oxygen is a medication prescribed by physician. Demonstrates appropriate use of inhaler and oxygen therapy.   Hypertension Yes   Intervention Provide education on lifestyle modifcations including regular physical activity/exercise, weight management, moderate sodium restriction and increased consumption of fresh fruit, vegetables, and low fat dairy, alcohol moderation, and smoking cessation.;Monitor prescription use compliance.   Expected Outcomes Short Term: Continued assessment and intervention until BP is < 140/67mm HG in hypertensive participants. < 130/33mm HG in hypertensive participants with diabetes, heart failure or chronic kidney disease.;Long Term: Maintenance of blood pressure at goal levels.      Tobacco Use Initial Evaluation: History  Smoking status   Former Smoker -- 1.00 packs/day for 50 years   Types: Cigarettes  Smokeless tobacco   Not on file    Copy of goals given to participant.

## 2016-05-31 ENCOUNTER — Encounter: Payer: Medicare Other | Admitting: Physical Therapy

## 2016-06-07 ENCOUNTER — Encounter: Payer: Medicare Other | Admitting: Physical Therapy

## 2016-06-07 DIAGNOSIS — J449 Chronic obstructive pulmonary disease, unspecified: Secondary | ICD-10-CM

## 2016-06-07 DIAGNOSIS — G4730 Sleep apnea, unspecified: Secondary | ICD-10-CM | POA: Diagnosis not present

## 2016-06-07 NOTE — Progress Notes (Signed)
Daily Session Note  Patient Details  Name: [name] MRN: [id number] Date of Birth: 1935/06/18 Referring Provider:        Pulmonary Rehab from 05/30/2016 in [facility] Cardiac and Pulmonary Rehab   Referring Provider  Raul Del      Encounter Date: 06/07/2016  Check In:     Session Check In - 06/07/16 1306    Check-In   Location [facility]-Cardiac & Pulmonary Rehab   Staff Present Heath Lark, RN, BSN, CCRP;Laureen Owens Shark, BS, RRT, Respiratory Dareen Piano, BA, ACSM CEP, Exercise Physiologist   Supervising physician immediately available to respond to emergencies [facility] immediately available ER MD   Physician(s) Lovena Le and Paduchowitz   Medication changes reported     No   Fall or balance concerns reported    No   Warm-up and Cool-down Performed on first and last piece of equipment   Resistance Training Performed Yes   VAD Patient? No   Pain Assessment   Currently in Pain? No/denies   Multiple Pain Sites No         Goals Met:  Proper associated with RPD/PD & O2 Sat Exercise tolerated well Strength training completed today  Goals Unmet:  Not Applicable  Comments: First full day of exercise!  Patient was oriented to gym and equipment including functions, settings, policies, and procedures.  Patient's individual exercise prescription and treatment plan were reviewed.  All starting workloads were established based on the results of the 6 minute walk test done at initial orientation visit.  The plan for exercise progression was also introduced and progression will be customized based on patient's performance and goals.   Dr. Emily Filbert is Medical Director for [place] and [facility].

## 2016-06-12 ENCOUNTER — Encounter: Payer: Medicare Other | Admitting: *Deleted

## 2016-06-12 DIAGNOSIS — G4730 Sleep apnea, unspecified: Secondary | ICD-10-CM | POA: Diagnosis not present

## 2016-06-12 DIAGNOSIS — J449 Chronic obstructive pulmonary disease, unspecified: Secondary | ICD-10-CM

## 2016-06-12 NOTE — Progress Notes (Signed)
Daily Session Note  Patient Details  Name: ATSUSHI YOM MRN: [id number] Date of Birth: April 11, 1935 Referring Provider:        Pulmonary Rehab from 05/30/2016 in [facility] Cardiac and Pulmonary Rehab   Referring Provider  Raul Del      Encounter Date: 06/12/2016  Check In:     Session Check In - 06/12/16 1124    Check-In   Location [facility]-Cardiac & Pulmonary Rehab   Staff Present Carson Myrtle, BS, RRT, Respiratory Bertis Ruddy, BS, ACSM CEP, Exercise Physiologist;Mary Kellie Shropshire, RN, BSN, MA   Supervising physician immediately available to respond to emergencies [facility] immediately available ER MD   Physician(s) Marcelene Butte and Corky Downs   Medication changes reported     No   Fall or balance concerns reported    No   Warm-up and Cool-down Performed on first and last piece of equipment   Resistance Training Performed Yes   VAD Patient? No   Pain Assessment   Currently in Pain? No/denies   Multiple Pain Sites No         Goals Met:  Proper associated with RPD/PD & O2 Sat Independence with exercise equipment Exercise tolerated well Strength training completed today  Goals Unmet:  Not Applicable  Comments: Patient completed exercise prescription and all exercise goals during rehab session. The exercise was tolerated well and the patient is progressing in the program.     Dr. Emily Filbert is Medical Director for [place] and [facility].

## 2016-06-14 DIAGNOSIS — G4730 Sleep apnea, unspecified: Secondary | ICD-10-CM

## 2016-06-14 DIAGNOSIS — J449 Chronic obstructive pulmonary disease, unspecified: Secondary | ICD-10-CM

## 2016-06-14 NOTE — Progress Notes (Signed)
Daily Session Note  Patient Details  Name: DELMAR ARRIAGA MRN: [id number] Date of Birth: July 29, 1935 Referring Provider:        Pulmonary Rehab from 05/30/2016 in [facility] Cardiac and Pulmonary Rehab   Referring Provider  Raul Del      Encounter Date: 06/14/2016  Check In:     Session Check In - 06/14/16 1215    Check-In   Location [facility]-Cardiac & Pulmonary Rehab   Staff Present Nyoka Cowden, RN, BSN, Walden Field, BS, RRT, Respiratory Dareen Piano, BA, ACSM CEP, Exercise Physiologist   Supervising physician immediately available to respond to emergencies [facility] immediately available ER MD   Physician(s) Jimmye Norman and Edd Fabian   Medication changes reported     No   Fall or balance concerns reported    No   Warm-up and Cool-down Performed on first and last piece of equipment   Resistance Training Performed Yes   VAD Patient? No   Pain Assessment   Currently in Pain? No/denies   Multiple Pain Sites No         Goals Met:  Proper associated with RPD/PD & O2 Sat Independence with exercise equipment Exercise tolerated well Strength training completed today  Goals Unmet:  Not Applicable  Comments: Pt able to follow exercise prescription today without complaint.  Will continue to monitor for progression.    Dr. Emily Filbert is Medical Director for [place] and [facility].

## 2016-06-16 ENCOUNTER — Encounter: Payer: Medicare Other | Admitting: *Deleted

## 2016-06-16 DIAGNOSIS — G4730 Sleep apnea, unspecified: Secondary | ICD-10-CM

## 2016-06-16 NOTE — Progress Notes (Signed)
Daily Session Note  Patient Details  Name: [name] MRN: [id number] Date of Birth: 08/21/1935 Referring Provider:        Pulmonary Rehab from 05/30/2016 in [facility] Cardiac and Pulmonary Rehab   Referring Provider  Fle[place]      Encounter Date: 06/16/2016  Check In:     Session Check In - 06/16/16 1039    Check-In   Location [facility]-Cardiac & Pulmonary Rehab   Staff Present Susanne Bice, RN, BSN, CCRP; , RN, BSN;Rebecca Sickles, DPT, CEEA   Supervising physician immediately available to respond to emergencies [facility] immediately available ER MD   Physician(s) Dr. Kinner Dr. Williams   Medication changes reported     No   Fall or balance concerns reported    No   Warm-up and Cool-down Performed on first and last piece of equipment   Resistance Training Performed Yes   VAD Patient? No   Pain Assessment   Currently in Pain? No/denies         Goals Met:  Proper associated with RPD/PD & O2 Sat Exercise tolerated well  Goals Unmet:  Not Applicable  Comments:     Dr. Mark Miller is Medical Director for [facility] and [facility]. 

## 2016-06-19 ENCOUNTER — Encounter: Payer: Medicare Other | Attending: Specialist | Admitting: *Deleted

## 2016-06-19 DIAGNOSIS — J449 Chronic obstructive pulmonary disease, unspecified: Secondary | ICD-10-CM

## 2016-06-19 DIAGNOSIS — G4730 Sleep apnea, unspecified: Secondary | ICD-10-CM | POA: Diagnosis not present

## 2016-06-19 NOTE — Progress Notes (Signed)
Daily Session Note  Patient Details  Name: [name] MRN: [id number] Date of Birth: 1935/02/12 Referring Provider:        Pulmonary Rehab from 05/30/2016 in [facility] Cardiac and Pulmonary Rehab   Referring Provider  Raul Del      Encounter Date: 06/19/2016  Check In:     Session Check In - 06/19/16 1212    Check-In   Location [facility]-Cardiac & Pulmonary Rehab   Staff Present Carson Myrtle, BS, RRT, Respiratory Bertis Ruddy, BS, ACSM CEP, Exercise Physiologist;Mary Kellie Shropshire, RN, BSN, MA   Supervising physician immediately available to respond to emergencies [facility] immediately available ER MD   Physician(s) Jimmye Norman and Joni Fears   Medication changes reported     No   Fall or balance concerns reported    No   Warm-up and Cool-down Performed on first and last piece of equipment   Resistance Training Performed Yes   VAD Patient? No   Pain Assessment   Currently in Pain? No/denies   Multiple Pain Sites No         Goals Met:  Proper associated with RPD/PD & O2 Sat Independence with exercise equipment Exercise tolerated well Strength training completed today  Goals Unmet:  Not Applicable  Comments: Patient completed exercise prescription and all exercise goals during rehab session. The exercise was tolerated well and the patient is progressing in the program.     Dr. Emily Filbert is Medical Director for [place] and [facility].

## 2016-06-21 ENCOUNTER — Encounter: Payer: Medicare Other | Admitting: *Deleted

## 2016-06-21 DIAGNOSIS — G4730 Sleep apnea, unspecified: Secondary | ICD-10-CM | POA: Diagnosis not present

## 2016-06-21 DIAGNOSIS — J449 Chronic obstructive pulmonary disease, unspecified: Secondary | ICD-10-CM

## 2016-06-21 NOTE — Progress Notes (Signed)
Daily Session Note  Patient Details  Name: [name] MRN: [id number] Date of Birth: 1935-09-15 Referring Provider:        Pulmonary Rehab from 05/30/2016 in [facility] Cardiac and Pulmonary Rehab   Referring Provider  Raul Del      Encounter Date: 06/21/2016  Check In:     Session Check In - 06/21/16 1231    Check-In   Location [facility]-Cardiac & Pulmonary Rehab   Staff Present Nada Maclachlan, BA, ACSM CEP, Exercise Physiologist;Chanel Mcadams Luan Pulling, MA, ACSM RCEP, Exercise Physiologist;Laureen Janell Quiet, RRT, Respiratory Therapist   Supervising physician immediately available to respond to emergencies [facility] immediately available ER MD   Physician(s) Alfred Levins and Marcelene Butte   Medication changes reported     No   Fall or balance concerns reported    No   Warm-up and Cool-down Performed on first and last piece of equipment   Resistance Training Performed Yes   VAD Patient? No   Pain Assessment   Currently in Pain? No/denies   Multiple Pain Sites No         Goals Met:  Proper associated with RPD/PD & O2 Sat Independence with exercise equipment Using PLB without cueing & demonstrates good technique Exercise tolerated well Strength training completed today  Goals Unmet:  Not Applicable  Comments: Pt able to follow exercise prescription today without complaint.  Will continue to monitor for progression.    Dr. Emily Filbert is Medical Director for [place] and [facility].

## 2016-06-23 ENCOUNTER — Encounter: Payer: Medicare Other | Admitting: *Deleted

## 2016-06-23 DIAGNOSIS — J449 Chronic obstructive pulmonary disease, unspecified: Secondary | ICD-10-CM

## 2016-06-23 DIAGNOSIS — G4730 Sleep apnea, unspecified: Secondary | ICD-10-CM | POA: Diagnosis not present

## 2016-06-23 NOTE — Progress Notes (Signed)
Daily Session Note  Patient Details  Name: [name] MRN: [id number] Date of Birth: [birth year] Referring Provider:        Pulmonary Rehab from 05/30/2016 in [facility] Cardiac and Pulmonary Rehab   Referring Provider  Raul Del      Encounter Date: 06/23/2016  Check In:     Session Check In - 06/23/16 1044    Check-In   Location [facility]-Cardiac & Pulmonary Rehab   Staff Present Nyoka Cowden, RN, BSN, Walden Field, BS, RRT, Respiratory Therapist;Rocky Gladden, RN, BSN   Supervising physician immediately available to respond to emergencies [facility] immediately available ER MD   Physician(s) Dr. Joni Fears, and Dr. Edd Fabian   Medication changes reported     No   Fall or balance concerns reported    No   Warm-up and Cool-down Performed on first and last piece of equipment   Resistance Training Performed Yes   VAD Patient? No   Pain Assessment   Currently in Pain? No/denies         Goals Met:  Proper associated with RPD/PD & O2 Sat Exercise tolerated well  Goals Unmet:  Not Applicable  Comments:     Dr. Emily Filbert is Medical Director for [place] and [facility].

## 2016-06-26 ENCOUNTER — Encounter: Payer: Medicare Other | Admitting: *Deleted

## 2016-06-26 ENCOUNTER — Encounter: Payer: Self-pay | Admitting: *Deleted

## 2016-06-26 DIAGNOSIS — G4730 Sleep apnea, unspecified: Secondary | ICD-10-CM

## 2016-06-26 DIAGNOSIS — J449 Chronic obstructive pulmonary disease, unspecified: Secondary | ICD-10-CM

## 2016-06-26 NOTE — Progress Notes (Signed)
Daily Session Note  Patient Details  Name: [name] MRN: [id number] Date of Birth: [birth year] Referring Provider:        Pulmonary Rehab from 05/30/2016 in [facility] Cardiac and Pulmonary Rehab   Referring Provider  Raul Del      Encounter Date: 06/26/2016  Check In:     Session Check In - 06/26/16 1057    Check-In   Location [facility]-Cardiac & Pulmonary Rehab   Staff Present Carson Myrtle, BS, RRT, Respiratory Therapist;Katherine Syme Amedeo Plenty, BS, ACSM CEP, Exercise Physiologist;Jessica Luan Pulling, MA, ACSM RCEP, Exercise Physiologist   Supervising physician immediately available to respond to emergencies [facility] immediately available ER MD   Physician(s) Dominic Pea and Kinner   Medication changes reported     No   Fall or balance concerns reported    No   Warm-up and Cool-down Performed on first and last piece of equipment   Resistance Training Performed Yes   VAD Patient? No   Pain Assessment   Currently in Pain? No/denies   Multiple Pain Sites No         Goals Met:  Proper associated with RPD/PD & O2 Sat Independence with exercise equipment Exercise tolerated well Strength training completed today  Goals Unmet:  Not Applicable  Comments: Patient completed exercise prescription and all exercise goals during rehab session. The exercise was tolerated well and the patient is progressing in the program.     Dr. Emily Filbert is Medical Director for [place] and [facility].

## 2016-06-26 NOTE — Progress Notes (Signed)
Pulmonary Individual Treatment Plan  Patient Details  Name: [name] MRN: [id number] Date of Birth: 09/22/1935 Referring Provider:        Pulmonary Rehab from 05/30/2016 in [facility] Cardiac and Pulmonary Rehab   Referring Provider  Raul Del      Initial Encounter Date:       Pulmonary Rehab from 05/30/2016 in [facility] Cardiac and Pulmonary Rehab   Date  05/30/16   Referring Provider  Raul Del      Visit Diagnosis: COPD, mild ([place])  Sleep apnea  Morbid obesity due to excess calories ([place])  Patient's Home Medications on Admission:  Current outpatient prescriptions:  .  albuterol (PROAIR HFA) 108 (90 Base) MCG/ACT inhaler, Inhale 2 puffs into the lungs every 6 (six) hours as needed. Reported on 04/21/2016, Disp: , Rfl:  .  aspirin 81 MG tablet, Take 81 mg by mouth daily. , Disp: , Rfl:  .  atorvastatin (LIPITOR) 40 MG tablet, Take 1 tablet (40 mg total) by mouth daily., Disp: 30 tablet, Rfl: 3 .  BREO ELLIPTA 100-25 MCG/INH AEPB, Inhale 1 puff into the lungs daily. Reported on 04/21/2016, Disp: , Rfl:  .  docusate sodium (COLACE) 50 MG capsule, Take 50 mg by mouth 2 (two) times daily., Disp: , Rfl:  .  fluticasone-salmeterol (ADVAIR HFA) 115-21 MCG/ACT inhaler, Inhale 2 puffs into the lungs 2 (two) times daily., Disp: , Rfl:  .  furosemide (LASIX) 20 MG tablet, Take 20 mg by mouth., Disp: , Rfl:  .  HYDROcodone-acetaminophen (NORCO/VICODIN) 5-325 MG tablet, Take 1-2 tablets by mouth every 6 (six) hours as needed for moderate pain. (Patient not taking: Reported on 04/21/2016), Disp: 30 tablet, Rfl: 0 .  levothyroxine (SYNTHROID, LEVOTHROID) 50 MCG tablet, Take 50 mcg by mouth daily., Disp: , Rfl:  .  losartan (COZAAR) 100 MG tablet, Take 1 tablet (100 mg total) by mouth daily., Disp: 30 tablet, Rfl: 3 .  nitroGLYCERIN (NITROSTAT) 0.4 MG SL tablet, Take 0.4 mg by mouth daily as needed. As needed for chest pain, Disp: , Rfl:  .  ondansetron (ZOFRAN ODT) 4 MG disintegrating tablet, Take 1 tablet (4  mg total) by mouth every 6 (six) hours as needed for nausea or vomiting., Disp: 20 tablet, Rfl: 0  Past Medical History: Past Medical History  Diagnosis Date  . COPD (chronic obstructive pulmonary disease) (HCC)     COPD  . Prostate CA [facility])     prostate ca dx 57 ys ago;  . Prostate CA [facility])     prostate ca dx 20 yrs ago  . Hypertension   . Hyperlipidemia   . Alcoholism ([place])   . PVD (peripheral vascular disease) ([place])   . Degenerative joint disease   . Bipolar affective disorder ([place])   . Tobacco abuse   . Coronary artery disease February 2005    PCI and Taxus drug-eluting stent placement to the distal RCA (3.5 x 12 mm)  . Essential hypertension   . Asthma     Tobacco Use: History  Smoking status  . Former Smoker -- 1.00 packs/day for 50 years  . Types: Cigarettes  Smokeless tobacco  . Not on file    Labs: Recent Review Flowsheet Data    Labs for ITP Cardiac and Pulmonary Rehab Latest Ref Rng 10/30/2010 01/04/2011 05/31/2014 03/02/2016 04/26/2016   Cholestrol 100 - 199 mg/dL 161 ATP III CLASSIFICATION: <200     mg/dL   Desirable 200-239  mg/dL   Borderline High >=240    mg/dL  High - 138 218(H) 189   LDLCALC 0 - 99 mg/dL 78 Total Cholesterol/HDL:CHD Risk Coronary Heart Disease Risk Table Men   Women 1/2 Average Risk   3.4   3.3 Average Risk       5.0   4.4 2 X Average Risk   9.6   7.1 3 X Average Risk  23.4   11.0 Use the calculated Patient Ratio above and the CHD Risk Table to determine the patient's CHD Risk. ATP III CLASSIFICATION (LDL): <100     mg/dL   Optimal 100-129  mg/dL   Near or Above Optimal 130-159  mg/dL   Borderline 160-189  mg/dL   High >190     mg/dL   Very High - 83 135(H) 97   HDL >39 mg/dL 71 - 40 58 66   Trlycerides 0 - 149 mg/dL 60 - 75 124 129   TCO2 0 - 100 mmol/L - 26 - - -       ADL UCSD:     Pulmonary Assessment Scores      05/30/16 0900       ADL UCSD   ADL Phase Entry     SOB Score total 49     Rest 1     Walk 3      Stairs 4     Bath 2     Dress 2     Shop 3        Pulmonary Function Assessment:     Pulmonary Function Assessment - 05/30/16 0900    Pulmonary Function Tests   FVC% 87 %   FEV1% 83 %   FEV1/FVC Ratio 73   RV% 80 %   DLCO% 60 %   Initial Spirometry Results   Comments PFT performed on 12/16/2015   Breath   Bilateral Breath Sounds Clear   Shortness of Breath Yes;Fear of Shortness of Breath;Limiting activity      Exercise Target Goals:    Exercise Program Goal: Individual exercise prescription set with THRR, safety & activity barriers. Participant demonstrates ability to understand and report RPE using BORG scale, to self-measure pulse accurately, and to acknowledge the importance of the exercise prescription.  Exercise Prescription Goal: Starting with aerobic activity 30 plus minutes a day, 3 days per week for initial exercise prescription. Provide home exercise prescription and guidelines that participant acknowledges understanding prior to discharge.  Activity Barriers & Risk Stratification:     Activity Barriers & Cardiac Risk Stratification - 05/30/16 0900    Activity Barriers & Cardiac Risk Stratification   Activity Barriers Shortness of Breath;Deconditioning;Muscular Weakness   Cardiac Risk Stratification Moderate      6 Minute Walk:     6 Minute Walk      05/30/16 1535       6 Minute Walk   Distance 533 feet     Walk Time 4 minutes     # of Rest Breaks 1  stopped at 4:00     MPH 1.5     METS 2.1     RPE 18     Symptoms Yes (comment)     Comments overall fatigue - legs and breathing     Resting HR 67 bpm     Resting BP 154/70 mmHg     Max Ex. HR 123 bpm     Max Ex. BP 160/70 mmHg     2 Minute Post BP 154/72 mmHg     Interval HR   Baseline HR 67  1 Minute HR 105     2 Minute HR 108     3 Minute HR 117     4 Minute HR 123     2 Minute Post HR 72     Interval Heart Rate? Yes     Interval Oxygen   Interval Oxygen? Yes     Baseline  Oxygen Saturation % 98 %     1 Minute Oxygen Saturation % 96 %     2 Minute Oxygen Saturation % 96 %     3 Minute Oxygen Saturation % 93 %     4 Minute Oxygen Saturation % 93 %     2 Minute Post Oxygen Saturation % 98 %        Initial Exercise Prescription:     Initial Exercise Prescription - 05/30/16 1500    Date of Initial Exercise RX and Referring Provider   Date 05/30/16   Referring Provider Raul Del   Treadmill   MPH 1.3   Grade 0   Minutes 15  4/4/4   METs 2   Recumbant Elliptical   Level 1   Watts 50   Minutes 15  can do 4/4/4    METs 2.1   T5 Nustep   Level 1   Watts 30   Minutes 15  4/4/4   METs 2.1   Prescription Details   Frequency (times per week) 3   Duration Progress to 45 minutes of aerobic exercise without signs/symptoms of physical distress   Intensity   THRR 40-80% of Max Heartrate 97-126   Ratings of Perceived Exertion 11-13   Progression   Progression Continue to progress workloads to maintain intensity without signs/symptoms of physical distress.   Resistance Training   Training Prescription Yes   Weight 3   Reps 10-15      Perform Capillary Blood Glucose checks as needed.  Exercise Prescription Changes:     Exercise Prescription Changes      06/07/16 1300 06/21/16 1300         Exercise Review   Progression  Yes      Response to Exercise   Blood Pressure (Admit) 126/66 mmHg 130/64 mmHg      Blood Pressure (Exercise) 192/78 mmHg 174/68 mmHg      Blood Pressure (Exit) 134/52 mmHg 142/58 mmHg      Heart Rate (Admit) 71 bpm 82 bpm      Heart Rate (Exercise) 111 bpm 116 bpm      Heart Rate (Exit) 89 bpm 86 bpm      Oxygen Saturation (Admit) 97 % 97 %      Oxygen Saturation (Exercise) 96 % 93 %      Oxygen Saturation (Exit) 98 % 97 %      Rating of Perceived Exertion (Exercise) 14 15      Perceived Dyspnea (Exercise) 4 4      Symptoms  none      Duration Progress to 30 minutes of continuous aerobic without signs/symptoms of  physical distress Progress to 45 minutes of aerobic exercise without signs/symptoms of physical distress      Intensity THRR unchanged THRR unchanged      Progression   Progression  Continue to progress workloads to maintain intensity without signs/symptoms of physical distress.      Average METs  2      Resistance Training   Training Prescription Yes Yes      Weight 3 3      Reps 10-15  10-15      Interval Training   Interval Training  No      Treadmill   MPH 1 1.3      Grade 0 0      Minutes 15  4/6/4 min work with 3 min rest '15  5 5 5       ' METs  2      Arm Ergometer   Level  3      Watts  18      Minutes  10      METs  2.1      Recumbant Elliptical   Level --  did not do - bothered knee       T5 Nustep   Level 1 2      Watts 30 24      Minutes 15  5 work 3 rest 3 times 15      METs  2         Exercise Comments:     Exercise Comments      06/07/16 1311 06/21/16 1310         Exercise Comments showed [name] a hamstring stretch to do during rest from TM as his hamstrings get really tight walking [name] is off to a good start!  He has already moved up to level 3 on the Arm Ergometer.  He is making good progress and we will continue to monitor for progression.  He did stop using the recumbent elliptical as it hurt his knees.         Discharge Exercise Prescription (Final Exercise Prescription Changes):     Exercise Prescription Changes - 06/21/16 1300    Exercise Review   Progression Yes   Response to Exercise   Blood Pressure (Admit) 130/64 mmHg   Blood Pressure (Exercise) 174/68 mmHg   Blood Pressure (Exit) 142/58 mmHg   Heart Rate (Admit) 82 bpm   Heart Rate (Exercise) 116 bpm   Heart Rate (Exit) 86 bpm   Oxygen Saturation (Admit) 97 %   Oxygen Saturation (Exercise) 93 %   Oxygen Saturation (Exit) 97 %   Rating of Perceived Exertion (Exercise) 15   Perceived Dyspnea (Exercise) 4   Symptoms none   Duration Progress to 45 minutes of aerobic exercise  without signs/symptoms of physical distress   Intensity THRR unchanged   Progression   Progression Continue to progress workloads to maintain intensity without signs/symptoms of physical distress.   Average METs 2   Resistance Training   Training Prescription Yes   Weight 3   Reps 10-15   Interval Training   Interval Training No   Treadmill   MPH 1.3   Grade 0   Minutes '15  5 5 5    ' METs 2   Arm Ergometer   Level 3   Watts 18   Minutes 10   METs 2.1   T5 Nustep   Level 2   Watts 24   Minutes 15   METs 2       Nutrition:  Target Goals: Understanding of nutrition guidelines, daily intake of sodium <1571m, cholesterol <2038m calories 30% from fat and 7% or less from saturated fats, daily to have 5 or more servings of fruits and vegetables.  Biometrics:    Nutrition Therapy Plan and Nutrition Goals:     Nutrition Therapy & Goals - 05/30/16 0900    Nutrition Therapy   Diet [name] like to consult with the dietitian. He does all  the cooking, cooks green vegtables,low salt, and likes sweets.       Nutrition Discharge: Rate Your Plate Scores:   Psychosocial: Target Goals: Acknowledge presence or absence of depression, maximize coping skills, provide positive support system. Participant is able to verbalize types and ability to use techniques and skills needed for reducing stress and depression.  Initial Review & Psychosocial Screening:     Initial Psych Review & Screening - 05/30/16 0900    Initial Review   Current issues with History of Depression   Family Dynamics   Good Support System? Yes   Comments Good support from his family; long history of manic depression; has been admitted to an institution for 8 months, 8 years ago - it was successful therapy and is not on medication at this time.   Barriers   Psychosocial barriers to participate in program The patient should benefit from training in stress management and relaxation.   Screening  Interventions   Interventions Encouraged to exercise;Program counselor consult      Quality of Life Scores:     Quality of Life - 05/30/16 0900    Quality of Life Scores   Health/Function Pre 20.25 %   Socioeconomic Pre 19.33 %   Psych/Spiritual Pre 19.33 %   Family Pre 20.6 %   GLOBAL Pre 19.97 %      PHQ-9:     Recent Review Flowsheet Data    Depression screen [facility] 2/9 05/30/2016   Decreased Interest 3   Down, Depressed, Hopeless 2   PHQ - 2 Score 5   Altered sleeping 3   Tired, decreased energy 3   Change in appetite 1   Feeling bad or failure about yourself  1   Trouble concentrating 0   Moving slowly or fidgety/restless 0   Suicidal thoughts 0   PHQ-9 Score 13   Difficult doing work/chores Somewhat difficult      Psychosocial Evaluation and Intervention:     Psychosocial Evaluation - 06/14/16 1137    Psychosocial Evaluation & Interventions   Interventions Encouraged to exercise with the program and follow exercise prescription;Stress management education;Relaxation education   Comments Counselor met with Nicholas. [name] today for initial psychosocial evaluation.  He is a 81 year old gentleman who has COPD and struggles with Sleep Apnea; Obesity and a history of "manic/depression" (Bi-Polar Disorder).  He states his support system is strong although he lives alone and reports his daughter is his primary support person - who lives 30 minutes away.  He admits to a history of "manic/Depression" and reports not being on any meds for the past 2 years.  He reports having been in treatment in [place] for 9 months at one point for alcohol and mood problems and is now able to recognize and cope with his triggers and mood issues.  Nicholas. [name] reports his mood currently is a "7"  on a scale of 1-10 with 10 being best.  He admits to his health being his primary stressor currently.  Nicholas. [name] has goals to lose weight, move and breathe better; walk better and increase his energy in general  while in this program.  Counselor asked Nicholas. [name] if he would benefit from a Counselor for his mood  diagnosis currently and he declined, but stated he generally enjoys group therapy.  Counselor will look for groups locally that take his insurance to help meet this request.  Counselor will follow with Nicholas. [name] throughout the course of this program.  Continued Psychosocial Services Needed Yes  Nicholas. [name] will benefit from all of the psychoeducational components of this program, as well as meeting with the dietician to address his weight loss goals.       Psychosocial Re-Evaluation:  Education: Education Goals: Education classes will be provided on a weekly basis, covering required topics. Participant will state understanding/return demonstration of topics presented.  Learning Barriers/Preferences:     Learning Barriers/Preferences - 05/30/16 0900    Learning Barriers/Preferences   Learning Barriers None   Learning Preferences Group Instruction;Individual Instruction;Pictoral;Skilled Demonstration;Verbal Instruction;Video;Written Material      Education Topics: Initial Evaluation Education: - Verbal, written and demonstration of respiratory meds, RPE/PD scales, oximetry and breathing techniques. Instruction on use of nebulizers and MDIs: cleaning and proper use, rinsing mouth with steroid doses and importance of monitoring MDI activations.          Pulmonary Rehab from 06/16/2016 in [facility] Cardiac and Pulmonary Rehab   Date  05/30/16   Educator  LB   Instruction Review Code  2- meets goals/outcomes      General Nutrition Guidelines/Fats and Fiber: -Group instruction provided by verbal, written material, models and posters to present the general guidelines for heart healthy nutrition. Gives an explanation and review of dietary fats and fiber.   Controlling Sodium/Reading Food Labels: -Group verbal and written material supporting the discussion of sodium use in heart healthy nutrition.  Review and explanation with models, verbal and written materials for utilization of the food label.   Exercise Physiology & Risk Factors: - Group verbal and written instruction with models to review the exercise physiology of the cardiovascular system and associated critical values. Details cardiovascular disease risk factors and the goals associated with each risk factor.   Aerobic Exercise & Resistance Training: - Gives group verbal and written discussion on the health impact of inactivity. On the components of aerobic and resistive training programs and the benefits of this training and how to safely progress through these programs.   Flexibility, Balance, General Exercise Guidelines: - Provides group verbal and written instruction on the benefits of flexibility and balance training programs. Provides general exercise guidelines with specific guidelines to those with heart or lung disease. Demonstration and skill practice provided.   Stress Management: - Provides group verbal and written instruction about the health risks of elevated stress, cause of high stress, and healthy ways to reduce stress.   Depression: - Provides group verbal and written instruction on the correlation between heart/lung disease and depressed mood, treatment options, and the stigmas associated with seeking treatment.   Exercise & Equipment Safety: - Individual verbal instruction and demonstration of equipment use and safety with use of the equipment.   Infection Prevention: - Provides verbal and written material to individual with discussion of infection control including proper hand washing and proper equipment cleaning during exercise session.      Pulmonary Rehab from 06/16/2016 in [facility] Cardiac and Pulmonary Rehab   Date  05/30/16   Educator  LB   Instruction Review Code  2- meets goals/outcomes      Falls Prevention: - Provides verbal and written material to individual with discussion of falls  prevention and safety.      Pulmonary Rehab from 06/16/2016 in [facility] Cardiac and Pulmonary Rehab   Date  05/30/16   Educator  LB   Instruction Review Code  2- meets goals/outcomes      Diabetes: - Individual verbal and written instruction to review signs/symptoms of diabetes, desired ranges of glucose  level fasting, after meals and with exercise. Advice that pre and post exercise glucose checks will be done for 3 sessions at entry of program.   Chronic Lung Diseases: - Group verbal and written instruction to review new updates, new respiratory medications, new advancements in procedures and treatments. Provide informative websites and "800" numbers of self-education.   Lung Procedures: - Group verbal and written instruction to describe testing methods done to diagnose lung disease. Review the outcome of test results. Describe the treatment choices: Pulmonary Function Tests, ABGs and oximetry.   Energy Conservation: - Provide group verbal and written instruction for methods to conserve energy, plan and organize activities. Instruct on pacing techniques, use of adaptive equipment and posture/positioning to relieve shortness of breath.   Triggers: - Group verbal and written instruction to review types of environmental controls: home humidity, furnaces, filters, dust mite/pet prevention, HEPA vacuums. To discuss weather changes, air quality and the benefits of nasal washing.   Exacerbations: - Group verbal and written instruction to provide: warning signs, infection symptoms, calling MD promptly, preventive modes, and value of vaccinations. Review: effective airway clearance, coughing and/or vibration techniques. Create an Sports administrator.      Pulmonary Rehab from 06/16/2016 in [facility] Cardiac and Pulmonary Rehab   Date  06/12/16   Educator  LB   Instruction Review Code  2- meets goals/outcomes      Oxygen: - Individual and group verbal and written instruction on oxygen therapy. Includes  supplement oxygen, available portable oxygen systems, continuous and intermittent flow rates, oxygen safety, concentrators, and Medicare reimbursement for oxygen.   Respiratory Medications: - Group verbal and written instruction to review medications for lung disease. Drug class, frequency, complications, importance of spacers, rinsing mouth after steroid MDI's, and proper cleaning methods for nebulizers.      Pulmonary Rehab from 06/16/2016 in [facility] Cardiac and Pulmonary Rehab   Date  05/30/16   Educator  LB   Instruction Review Code  2- meets goals/outcomes      AED/CPR: - Group verbal and written instruction with the use of models to demonstrate the basic use of the AED with the basic ABC's of resuscitation.   Breathing Retraining: - Provides individuals verbal and written instruction on purpose, frequency, and proper technique of diaphragmatic breathing and pursed-lipped breathing. Applies individual practice skills.      Pulmonary Rehab from 06/16/2016 in [facility] Cardiac and Pulmonary Rehab   Date  05/30/16   Educator  LB   Instruction Review Code  2- meets goals/outcomes      Anatomy and Physiology of the Lungs: - Group verbal and written instruction with the use of models to provide basic lung anatomy and physiology related to function, structure and complications of lung disease.   Heart Failure: - Group verbal and written instruction on the basics of heart failure: signs/symptoms, treatments, explanation of ejection fraction, enlarged heart and cardiomyopathy.   Sleep Apnea: - Individual verbal and written instruction to review Obstructive Sleep Apnea. Review of risk factors, methods for diagnosing and types of masks and machines for OSA.      Pulmonary Rehab from 06/16/2016 in [facility] Cardiac and Pulmonary Rehab   Date  05/30/16   Educator  LB   Instruction Review Code  2- meets goals/outcomes      Anxiety: - Provides group, verbal and written instruction on the correlation  between heart/lung disease and anxiety, treatment options, and management of anxiety.   Relaxation: - Provides group, verbal and written instruction about the benefits  of relaxation for patients with heart/lung disease. Also provides patients with examples of relaxation techniques.   Knowledge Questionnaire Score:     Knowledge Questionnaire Score - 05/30/16 0900    Knowledge Questionnaire Score   Pre Score 9/10       Core Components/Risk Factors/Patient Goals at Admission:     Personal Goals and Risk Factors at Admission - 05/30/16 0900    Core Components/Risk Factors/Patient Goals on Admission    Weight Management Yes;Obesity   Intervention Weight Management: Develop a combined nutrition and exercise program designed to reach desired caloric intake, while maintaining appropriate intake of nutrient and fiber, sodium and fats, and appropriate energy expenditure required for the weight goal.;Weight Management: Provide education and appropriate resources to help participant work on and attain dietary goals.;Weight Management/Obesity: Establish reasonable short term and long term weight goals.;Obesity: Provide education and appropriate resources to help participant work on and attain dietary goals.  [name] would like to consult with the dietitian. He does all the cooking, cooks green vegtables,low salt, and likes sweets.   Admit Weight 308 lb 14.4 oz (140.116 kg)   Goal Weight: Short Term 298 lb (135.172 kg)   Goal Weight: Long Term 160 lb (72.576 kg)   Expected Outcomes Short Term: Continue to assess and modify interventions until short term weight is achieved;Long Term: Adherence to nutrition and physical activity/exercise program aimed toward attainment of established weight goal;Weight Maintenance: Understanding of the daily nutrition guidelines, which includes 25-35% calories from fat, 7% or less cal from saturated fats, less than 232m cholesterol, less than 1.5gm of sodium, & 5 or  more servings of fruits and vegetables daily;Weight Loss: Understanding of general recommendations for a balanced deficit meal plan, which promotes 1-2 lb weight loss per week and includes a negative energy balance of [phone] kcal/d;Understanding recommendations for meals to include 15-35% energy as protein, 25-35% energy from fat, 35-60% energy from carbohydrates, less than 2071mof dietary cholesterol, 20-35 gm of total fiber daily;Understanding of distribution of calorie intake throughout the day with the consumption of 4-5 meals/snacks   Sedentary Yes   Intervention Provide advice, education, support and counseling about physical activity/exercise needs.;Develop an individualized exercise prescription for aerobic and resistive training based on initial evaluation findings, risk stratification, comorbidities and participant's personal goals.   Expected Outcomes Achievement of increased cardiorespiratory fitness and enhanced flexibility, muscular endurance and strength shown through measurements of functional capacity and personal statement of participant.   Increase Strength and Stamina Yes   Intervention Provide advice, education, support and counseling about physical activity/exercise needs.;Develop an individualized exercise prescription for aerobic and resistive training based on initial evaluation findings, risk stratification, comorbidities and participant's personal goals.   Expected Outcomes Achievement of increased cardiorespiratory fitness and enhanced flexibility, muscular endurance and strength shown through measurements of functional capacity and personal statement of participant.   Improve shortness of breath with ADL's Yes   Intervention Provide education, individualized exercise plan and daily activity instruction to help decrease symptoms of SOB with activities of daily living.   Expected Outcomes Short Term: Achieves a reduction of symptoms when performing activities of daily living.    Develop more efficient breathing techniques such as purse lipped breathing and diaphragmatic breathing; and practicing self-pacing with activity Yes   Intervention Provide education, demonstration and support about specific breathing techniuqes utilized for more efficient breathing. Include techniques such as pursed lipped breathing, diaphragmatic breathing and self-pacing activity.   Expected Outcomes Short Term: Participant will be able to demonstrate  and use breathing techniques as needed throughout daily activities.   Increase knowledge of respiratory medications and ability to use respiratory devices properly  Yes  [name] is on Advair MDI and Albuterol MDI. Instructed him in use of spacer. Wears full face mask with CPAP; 16cmH2O; compliant.   Intervention Provide education and demonstration as needed of appropriate use of medications, inhalers, and oxygen therapy.   Expected Outcomes Short Term: Achieves understanding of medications use. Understands that oxygen is a medication prescribed by physician. Demonstrates appropriate use of inhaler and oxygen therapy.   Hypertension Yes   Intervention Provide education on lifestyle modifcations including regular physical activity/exercise, weight management, moderate sodium restriction and increased consumption of fresh fruit, vegetables, and low fat dairy, alcohol moderation, and smoking cessation.;Monitor prescription use compliance.   Expected Outcomes Short Term: Continued assessment and intervention until BP is < 140/63m HG in hypertensive participants. < 130/853mHG in hypertensive participants with diabetes, heart failure or chronic kidney disease.;Long Term: Maintenance of blood pressure at goal levels.      Core Components/Risk Factors/Patient Goals Review:      Goals and Risk Factor Review      06/07/16 1315 06/12/16 1000 06/16/16 1043       Core Components/Risk Factors/Patient Goals Review   Personal Goals Review Develop more  efficient breathing techniques such as purse lipped breathing and diaphragmatic breathing and practicing self-pacing with activity. Increase knowledge of respiratory medications and ability to use respiratory devices properly. Weight Management/Obesity;Hypertension     Review reviewed PLB and patient verbally stated understanding Discussed with Nicholas LaEthingtonbout his 2 inhalers. He has a good understanding of his Advair - rinses his mouth out after use. He rarely uses his Albuterol inhaler. He also is very compliant with his CPAP and knows how it has improved his health. weight up 4 lbs and Instructed Atlee to call his MD's office. He takes a fluid pill every other day.  HoKaydonays he really wants to lose weight. Blood pressure is good.      Expected Outcomes  Continue to be compliant with his CPAP and AdvairMDI. Weight decrease.         Core Components/Risk Factors/Patient Goals at Discharge (Final Review):      Goals and Risk Factor Review - 06/16/16 1043    Core Components/Risk Factors/Patient Goals Review   Personal Goals Review Weight Management/Obesity;Hypertension   Review weight up 4 lbs and Instructed Armand to call his MD's office. He takes a fluid pill every other day.  HoAbdelazizays he really wants to lose weight. Blood pressure is good.    Expected Outcomes Weight decrease.       ITP Comments:     ITP Comments      06/16/16 1043           ITP Comments weight up 4 lbs and Instructed Darcy to call his MD's office. He takes a fluid pill every other day.           Comments: 30 Day Review

## 2016-06-28 ENCOUNTER — Encounter: Payer: Medicare Other | Admitting: *Deleted

## 2016-06-28 DIAGNOSIS — G4730 Sleep apnea, unspecified: Secondary | ICD-10-CM

## 2016-06-28 DIAGNOSIS — J449 Chronic obstructive pulmonary disease, unspecified: Secondary | ICD-10-CM

## 2016-06-28 NOTE — Progress Notes (Signed)
Daily Session Note  Patient Details  Name: [name] MRN: [id number] Date of Birth: 1934-12-25 Referring Provider:        Pulmonary Rehab from 05/30/2016 in [facility] Cardiac and Pulmonary Rehab   Referring Provider  Raul Del      Encounter Date: 06/28/2016  Check In:     Session Check In - 06/28/16 1139    Check-In   Location [facility]-Cardiac & Pulmonary Rehab   Staff Present Alberteen Sam, MA, ACSM RCEP, Exercise Physiologist;Amanda Oletta Darter, BA, ACSM CEP, Exercise Physiologist;Laureen Janell Quiet, RRT, Respiratory Therapist   Supervising physician immediately available to respond to emergencies [facility] immediately available ER MD   Physician(s) Edd Fabian and Jimmye Norman   Medication changes reported     No   Fall or balance concerns reported    No   Warm-up and Cool-down Performed on first and last piece of equipment   Resistance Training Performed Yes   VAD Patient? No   Pain Assessment   Currently in Pain? No/denies   Multiple Pain Sites No         Goals Met:  Proper associated with RPD/PD & O2 Sat Exercise tolerated well Queuing for purse lip breathing Strength training completed today  Goals Unmet:  Not Applicable  Comments: Pt able to follow exercise prescription today without complaint.  Will continue to monitor for progression.    Dr. Emily Filbert is Medical Director for [place] and [facility].

## 2016-06-30 ENCOUNTER — Encounter: Payer: Medicare Other | Admitting: Respiratory Therapy

## 2016-06-30 DIAGNOSIS — G4730 Sleep apnea, unspecified: Secondary | ICD-10-CM | POA: Diagnosis not present

## 2016-06-30 DIAGNOSIS — J449 Chronic obstructive pulmonary disease, unspecified: Secondary | ICD-10-CM

## 2016-06-30 NOTE — Progress Notes (Signed)
Daily Session Note  Patient Details  Name: [name] MRN: [id number] Date of Birth: Jun 25, 1935 Referring Provider:        Pulmonary Rehab from 05/30/2016 in [facility] Cardiac and Pulmonary Rehab   Referring Provider  Raul Del      Encounter Date: 06/30/2016  Check In:     Session Check In - 06/30/16 1322    Check-In   Location [facility]-Cardiac & Pulmonary Rehab   Staff Present Frederich Cha, RRT, RCP, Respiratory Therapist;Mary Kellie Shropshire, RN, BSN, Robley Fries, RN, BSN   Supervising physician immediately available to respond to emergencies [facility] immediately available ER MD   Physician(s) Dr. Kerman Passey and Jimmye Norman   Medication changes reported     No   Fall or balance concerns reported    No   Warm-up and Cool-down Performed on first and last piece of equipment   Resistance Training Performed Yes   VAD Patient? No   Pain Assessment   Currently in Pain? No/denies         Goals Met:  Proper associated with RPD/PD & O2 Sat Independence with exercise equipment Exercise tolerated well Strength training completed today  Goals Unmet:  Not Applicable  Comments: Pt able to follow exercise prescription today without complaint.  Will continue to monitor for progression.   Dr. Emily Filbert is Medical Director for [place] and [facility].

## 2016-07-03 ENCOUNTER — Encounter: Payer: Medicare Other | Admitting: *Deleted

## 2016-07-03 DIAGNOSIS — G4730 Sleep apnea, unspecified: Secondary | ICD-10-CM | POA: Diagnosis not present

## 2016-07-03 DIAGNOSIS — J449 Chronic obstructive pulmonary disease, unspecified: Secondary | ICD-10-CM

## 2016-07-03 NOTE — Progress Notes (Signed)
Daily Session Note  Patient Details  Name: [name] MRN: [id number] Date of Birth: August 21, 1935 Referring Provider:        Pulmonary Rehab from 05/30/2016 in [facility] Cardiac and Pulmonary Rehab   Referring Provider  Raul Del      Encounter Date: 07/03/2016  Check In:     Session Check In - 07/03/16 1137    Check-In   Location [facility]-Cardiac & Pulmonary Rehab   Staff Present Carson Myrtle, BS, RRT, Respiratory Therapist;Dawsen Krieger Amedeo Plenty, BS, ACSM CEP, Exercise Physiologist;Jessica Luan Pulling, MA, ACSM RCEP, Exercise Physiologist   Supervising physician immediately available to respond to emergencies [facility] immediately available ER MD   Physician(s) Joni Fears and Corky Downs   Medication changes reported     No   Fall or balance concerns reported    No   Warm-up and Cool-down Performed on first and last piece of equipment   Resistance Training Performed Yes   VAD Patient? No   Pain Assessment   Currently in Pain? No/denies   Multiple Pain Sites No         Goals Met:  Proper associated with RPD/PD & O2 Sat Independence with exercise equipment Exercise tolerated well Strength training completed today  Goals Unmet:  Not Applicable  Comments: Patient completed exercise prescription and all exercise goals during rehab session. The exercise was tolerated well and the patient is progressing in the program.     Dr. Emily Filbert is Medical Director for [place] and [facility].

## 2016-07-05 DIAGNOSIS — G4730 Sleep apnea, unspecified: Secondary | ICD-10-CM | POA: Diagnosis not present

## 2016-07-05 NOTE — Progress Notes (Signed)
Daily Session Note  Patient Details  Name: [name] MRN: [id number] Date of Birth: [birth year] Referring Provider:        Pulmonary Rehab from 05/30/2016 in [facility] Cardiac and Pulmonary Rehab   Referring Provider  Raul Del      Encounter Date: 07/05/2016  Check In:     Session Check In - 07/05/16 1119    Check-In   Location [facility]-Cardiac & Pulmonary Rehab   Staff Present Heath Lark, RN, BSN, CCRP;Laureen Owens Shark, BS, RRT, Respiratory Dareen Piano, BA, ACSM CEP, Exercise Physiologist   Supervising physician immediately available to respond to emergencies [facility] immediately available ER MD   Physician(s) Cinda Quest and Jimmye Norman   Medication changes reported     No   Fall or balance concerns reported    No   Warm-up and Cool-down Performed on first and last piece of equipment   Resistance Training Performed Yes   VAD Patient? No   Pain Assessment   Currently in Pain? No/denies   Multiple Pain Sites No         Goals Met:  Proper associated with RPD/PD & O2 Sat Independence with exercise equipment Exercise tolerated well Strength training completed today  Goals Unmet:  Not Applicable  Comments: Pt able to follow exercise prescription today without complaint.  Will continue to monitor for progression.    Dr. Emily Filbert is Medical Director for [place] and [facility].

## 2016-07-07 ENCOUNTER — Encounter: Payer: Medicare Other | Admitting: Respiratory Therapy

## 2016-07-07 DIAGNOSIS — G4730 Sleep apnea, unspecified: Secondary | ICD-10-CM | POA: Diagnosis not present

## 2016-07-07 DIAGNOSIS — J449 Chronic obstructive pulmonary disease, unspecified: Secondary | ICD-10-CM

## 2016-07-07 NOTE — Progress Notes (Signed)
Daily Session Note  Patient Details  Name: CRAIGE PATEL MRN: [id number] Date of Birth: 05/30/1935 Referring Provider:        Pulmonary Rehab from 05/30/2016 in [facility] Cardiac and Pulmonary Rehab   Referring Provider  Raul Del      Encounter Date: 07/07/2016  Check In:     Session Check In - 07/07/16 1151    Check-In   Location [facility]-Cardiac & Pulmonary Rehab   Staff Present Gerlene Burdock, RN, BSN;Stacey Blanch Media, RRT, RCP, Respiratory Dareen Piano, BA, ACSM CEP, Exercise Physiologist   Supervising physician immediately available to respond to emergencies [facility] immediately available ER MD   Physician(s) Cinda Quest and Jimmye Norman   Medication changes reported     No   Fall or balance concerns reported    No   Warm-up and Cool-down Performed on first and last piece of equipment   Resistance Training Performed Yes   VAD Patient? No   Pain Assessment   Currently in Pain? No/denies   Multiple Pain Sites No         Goals Met:  Proper associated with RPD/PD & O2 Sat Independence with exercise equipment Exercise tolerated well Queuing for purse lip breathing Strength training completed today  Goals Unmet:  Not Applicable  Comments: Pt able to follow exercise prescription today without complaint.  Will continue to monitor for progression.    Dr. Emily Filbert is Medical Director for [place] and [facility].

## 2016-07-10 ENCOUNTER — Encounter: Payer: Medicare Other | Admitting: *Deleted

## 2016-07-10 DIAGNOSIS — G4730 Sleep apnea, unspecified: Secondary | ICD-10-CM

## 2016-07-10 DIAGNOSIS — J449 Chronic obstructive pulmonary disease, unspecified: Secondary | ICD-10-CM

## 2016-07-10 NOTE — Progress Notes (Signed)
Daily Session Note  Patient Details  Name: [name] MRN: [id number] Date of Birth: 23-Aug-1935 Referring Provider:   April Manson Pulmonary Rehab from 05/30/2016 in [facility] Cardiac and Pulmonary Rehab  Referring Provider  Raul Del      Encounter Date: 07/10/2016  Check In:     Session Check In - 07/10/16 1139      Check-In   Location [facility]-Cardiac & Pulmonary Rehab   Staff Present Earlean Shawl, BS, ACSM CEP, Exercise Physiologist;Jessica Luan Pulling, MA, ACSM RCEP, Exercise Physiologist;Laureen Owens Shark, BS, RRT, Respiratory Therapist   Supervising physician immediately available to respond to emergencies [facility] immediately available ER MD   Physician(s) Drs. Archie Balboa and [place]   Medication changes reported     No   Fall or balance concerns reported    No   Warm-up and Cool-down Performed on first and last piece of equipment   Resistance Training Performed Yes   VAD Patient? No     Pain Assessment   Currently in Pain? No/denies   Multiple Pain Sites No         Goals Met:  Proper associated with RPD/PD & O2 Sat Independence with exercise equipment Exercise tolerated well Strength training completed today  Goals Unmet:  Not Applicable  Comments: Patient completed exercise prescription and all exercise goals during rehab session. The exercise was tolerated well and the patient is progressing in the program.     Dr. Emily Filbert is Medical Director for [place] and [facility].

## 2016-07-12 ENCOUNTER — Encounter: Payer: Medicare Other | Admitting: *Deleted

## 2016-07-12 DIAGNOSIS — G4730 Sleep apnea, unspecified: Secondary | ICD-10-CM

## 2016-07-12 DIAGNOSIS — J449 Chronic obstructive pulmonary disease, unspecified: Secondary | ICD-10-CM

## 2016-07-12 NOTE — Progress Notes (Signed)
Daily Session Note  Patient Details  Name: NEFTALI ABAIR MRN: [id number] Date of Birth: 01/29/1935 Referring Provider:   Flowsheet Row Pulmonary Rehab from 05/30/2016 in [facility] Cardiac and Pulmonary Rehab  Referring Provider  Raul Del      Encounter Date: 07/12/2016  Check In:     Session Check In - 07/12/16 1328      Check-In   Staff Present Heath Lark, RN, BSN, CCRP;Laureen Owens Shark, BS, RRT, Respiratory Dareen Piano, BA, ACSM CEP, Exercise Physiologist   Supervising physician immediately available to respond to emergencies [facility] immediately available ER MD   Physician(s) Drs: Alfred Levins and Corky Downs   Medication changes reported     No   Fall or balance concerns reported    No   Warm-up and Cool-down Performed on first and last piece of equipment   Resistance Training Performed Yes   VAD Patient? No     Pain Assessment   Currently in Pain? No/denies         Goals Met:  Exercise tolerated well Strength training completed today  Goals Unmet:  Not Applicable  Comments: Doing well with exercise prescription progression.    Dr. Emily Filbert is Medical Director for [place] and [facility].

## 2016-07-14 ENCOUNTER — Encounter: Payer: Medicare Other | Admitting: *Deleted

## 2016-07-14 DIAGNOSIS — J449 Chronic obstructive pulmonary disease, unspecified: Secondary | ICD-10-CM

## 2016-07-14 DIAGNOSIS — G4730 Sleep apnea, unspecified: Secondary | ICD-10-CM | POA: Diagnosis not present

## 2016-07-14 NOTE — Progress Notes (Signed)
Daily Session Note  Patient Details  Name: ESTEFAN PATTISON MRN: [id number] Date of Birth: 11/04/1935 Referring Provider:   Flowsheet Row Pulmonary Rehab from 05/30/2016 in [facility] Cardiac and Pulmonary Rehab  Referring Provider  Raul Del      Encounter Date: 07/14/2016  Check In:     Session Check In - 07/14/16 1057      Check-In   Staff Present Gerlene Burdock, RN, BSN;Stacey Blanch Media, RRT, RCP, Respiratory Therapist;Mary Kellie Shropshire, RN, BSN, MA   Supervising physician immediately available to respond to emergencies [facility] immediately available ER MD   Physician(s) Dr. Claudette Head and Dr. Edd Fabian   Medication changes reported     No   Fall or balance concerns reported    No   Warm-up and Cool-down Performed on first and last piece of equipment   Resistance Training Performed Yes   VAD Patient? No     Pain Assessment   Currently in Pain? No/denies         Goals Met:  Proper associated with RPD/PD & O2 Sat Exercise tolerated well  Goals Unmet:  Not Applicable  Comments:     Dr. Emily Filbert is Medical Director for [place] and [facility].

## 2016-07-17 ENCOUNTER — Encounter: Payer: Medicare Other | Admitting: *Deleted

## 2016-07-17 DIAGNOSIS — J449 Chronic obstructive pulmonary disease, unspecified: Secondary | ICD-10-CM

## 2016-07-17 DIAGNOSIS — G4730 Sleep apnea, unspecified: Secondary | ICD-10-CM

## 2016-07-17 NOTE — Progress Notes (Signed)
Daily Session Note  Patient Details  Name: [name] MRN: [id number] Date of Birth: [birth year] Referring Provider:   Flowsheet Row Pulmonary Rehab from 05/30/2016 in [facility] Cardiac and Pulmonary Rehab  Referring Provider  Raul Del      Encounter Date: 07/17/2016  Check In:     Session Check In - 07/17/16 1114      Check-In   Location [facility]-Cardiac & Pulmonary Rehab   Staff Present Carson Myrtle, BS, RRT, Respiratory Therapist;Kemba Hoppes Amedeo Plenty, BS, ACSM CEP, Exercise Physiologist;Jessica Luan Pulling, MA, ACSM RCEP, Exercise Physiologist   Supervising physician immediately available to respond to emergencies [facility] immediately available ER MD   Physician(s) Kerman Passey and [place]   Medication changes reported     No   Fall or balance concerns reported    No   Warm-up and Cool-down Performed on first and last piece of equipment   Resistance Training Performed Yes   VAD Patient? No     Pain Assessment   Currently in Pain? No/denies   Multiple Pain Sites No         Goals Met:  Proper associated with RPD/PD & O2 Sat Independence with exercise equipment Exercise tolerated well Strength training completed today  Goals Unmet:  Not Applicable  Comments: Patient completed exercise prescription and all exercise goals during rehab session. The exercise was tolerated well and the patient is progressing in the program.     Dr. Emily Filbert is Medical Director for [place] and [facility].

## 2016-07-19 ENCOUNTER — Encounter: Payer: Medicare Other | Attending: Specialist

## 2016-07-19 DIAGNOSIS — G4730 Sleep apnea, unspecified: Secondary | ICD-10-CM | POA: Insufficient documentation

## 2016-07-19 DIAGNOSIS — J449 Chronic obstructive pulmonary disease, unspecified: Secondary | ICD-10-CM

## 2016-07-19 NOTE — Progress Notes (Signed)
Daily Session Note  Patient Details  Name: [name] MRN: [id number] Date of Birth: May 23, 1935 Referring Provider:   Flowsheet Row Pulmonary Rehab from 05/30/2016 in [facility] Cardiac and Pulmonary Rehab  Referring Provider  Raul Del      Encounter Date: 07/19/2016  Check In:     Session Check In - 07/19/16 1227      Check-In   Location [facility]-Cardiac & Pulmonary Rehab   Staff Present Heath Lark, RN, BSN, CCRP;Laureen Owens Shark, BS, RRT, Respiratory Dareen Piano, BA, ACSM CEP, Exercise Physiologist   Supervising physician immediately available to respond to emergencies [facility] immediately available ER MD   Physician(s) Quentin Cornwall and Marcelene Butte   Medication changes reported     No   Fall or balance concerns reported    No   Warm-up and Cool-down Performed on first and last piece of equipment   Resistance Training Performed Yes   VAD Patient? No     Pain Assessment   Currently in Pain? No/denies   Multiple Pain Sites No         Goals Met:  Proper associated with RPD/PD & O2 Sat Independence with exercise equipment Exercise tolerated well Strength training completed today  Goals Unmet:  Not Applicable  Comments: Pt able to follow exercise prescription today without complaint.  Will continue to monitor for progression.    Dr. Emily Filbert is Medical Director for [place] and [facility].

## 2016-07-21 ENCOUNTER — Encounter: Payer: Medicare Other | Admitting: *Deleted

## 2016-07-21 DIAGNOSIS — G4730 Sleep apnea, unspecified: Secondary | ICD-10-CM | POA: Diagnosis not present

## 2016-07-21 DIAGNOSIS — J449 Chronic obstructive pulmonary disease, unspecified: Secondary | ICD-10-CM

## 2016-07-21 NOTE — Progress Notes (Signed)
Daily Session Note  Patient Details  Name: [name] MRN: [id number] Date of Birth: 04-02-1935 Referring Provider:   Flowsheet Row Pulmonary Rehab from 05/30/2016 in [facility] Cardiac and Pulmonary Rehab  Referring Provider  Raul Del      Encounter Date: 07/21/2016  Check In:     Session Check In - 07/21/16 1209      Check-In   Staff Present Heath Lark, RN, BSN, CCRP;Carroll Enterkin, RN, BSN;Laureen Owens Shark, BS, RRT, Respiratory Therapist   Supervising physician immediately available to respond to emergencies [facility] immediately available ER MD   Physician(s) Drs: Kerman Passey and Darl Householder   Medication changes reported     No   Fall or balance concerns reported    No   Warm-up and Cool-down Performed on first and last piece of equipment   Resistance Training Performed Yes   VAD Patient? No     Pain Assessment   Currently in Pain? No/denies         Goals Met:  Exercise tolerated well Strength training completed today  Goals Unmet:  Not Applicable  Comments: Doing well with exercise prescription progression.    Dr. Emily Filbert is Medical Director for [place] and [facility].

## 2016-07-24 ENCOUNTER — Encounter: Payer: Self-pay | Admitting: *Deleted

## 2016-07-24 ENCOUNTER — Encounter: Payer: Medicare Other | Admitting: *Deleted

## 2016-07-24 DIAGNOSIS — J449 Chronic obstructive pulmonary disease, unspecified: Secondary | ICD-10-CM

## 2016-07-24 DIAGNOSIS — G4730 Sleep apnea, unspecified: Secondary | ICD-10-CM

## 2016-07-24 NOTE — Progress Notes (Signed)
Pulmonary Individual Treatment Plan  Patient Details  Name: [name] MRN: [id number] Date of Birth: 10/09/1935 Referring Provider:   Flowsheet Row Pulmonary Rehab from 05/30/2016 in [facility] Cardiac and Pulmonary Rehab  Referring Provider  [name]      Initial Encounter Date:  Flowsheet Row Pulmonary Rehab from 05/30/2016 in [facility] Cardiac and Pulmonary Rehab  Date  05/30/16  Referring Provider  [name]      Visit Diagnosis: COPD, mild ([place])  Morbid obesity due to excess calories ([place])  Sleep apnea  Patient's Home Medications on Admission:  Current Outpatient Prescriptions:  .  albuterol (PROAIR HFA) 108 (90 Base) MCG/ACT inhaler, Inhale 2 puffs into the lungs every 6 (six) hours as needed. Reported on 04/21/2016, Disp: , Rfl:  .  aspirin 81 MG tablet, Take 81 mg by mouth daily. , Disp: , Rfl:  .  atorvastatin (LIPITOR) 40 MG tablet, Take 1 tablet (40 mg total) by mouth daily., Disp: 30 tablet, Rfl: 3 .  BREO ELLIPTA 100-25 MCG/INH AEPB, Inhale 1 puff into the lungs daily. Reported on 04/21/2016, Disp: , Rfl:  .  docusate sodium (COLACE) 50 MG capsule, Take 50 mg by mouth 2 (two) times daily., Disp: , Rfl:  .  fluticasone-salmeterol (ADVAIR HFA) 115-21 MCG/ACT inhaler, Inhale 2 puffs into the lungs 2 (two) times daily., Disp: , Rfl:  .  furosemide (LASIX) 20 MG tablet, Take 20 mg by mouth., Disp: , Rfl:  .  HYDROcodone-acetaminophen (NORCO/VICODIN) 5-325 MG tablet, Take 1-2 tablets by mouth every 6 (six) hours as needed for moderate pain. (Patient not taking: Reported on 04/21/2016), Disp: 30 tablet, Rfl: 0 .  levothyroxine (SYNTHROID, LEVOTHROID) 50 MCG tablet, Take 50 mcg by mouth daily., Disp: , Rfl:  .  losartan (COZAAR) 100 MG tablet, Take 1 tablet (100 mg total) by mouth daily., Disp: 30 tablet, Rfl: 3 .  nitroGLYCERIN (NITROSTAT) 0.4 MG SL tablet, Take 0.4 mg by mouth daily as needed. As needed for chest pain, Disp: , Rfl:  .  ondansetron (ZOFRAN ODT) 4 MG disintegrating tablet,  Take 1 tablet (4 mg total) by mouth every 6 (six) hours as needed for nausea or vomiting., Disp: 20 tablet, Rfl: 0  Past Medical History: Past Medical History:  Diagnosis Date  . Alcoholism ([place])   . Asthma   . Bipolar affective disorder ([place])   . COPD (chronic obstructive pulmonary disease) (HCC)    COPD  . Coronary artery disease February 2005   PCI and Taxus drug-eluting stent placement to the distal RCA (3.5 x 12 mm)  . Degenerative joint disease   . Essential hypertension   . Hyperlipidemia   . Hypertension   . Prostate CA [facility])    prostate ca dx 65 ys ago;  . Prostate CA [facility])    prostate ca dx 20 yrs ago  . PVD (peripheral vascular disease) ([place])   . Tobacco abuse     Tobacco Use: History  Smoking Status  . Former Smoker  . Packs/day: 1.00  . Years: 50.00  . Types: Cigarettes  Smokeless Tobacco  . Not on file    Labs: Recent Review Flowsheet Data    Labs for ITP Cardiac and Pulmonary Rehab Latest Ref Rng & Units 10/30/2010 01/04/2011 05/31/2014 03/02/2016 04/26/2016   Cholestrol 100 - 199 mg/dL 161 ATP III CLASSIFICATION: <200     mg/dL   Desirable 200-239  mg/dL   Borderline High >=240    mg/dL   High - 138 218(H) 189  LDLCALC 0 - 99 mg/dL 78 Total Cholesterol/HDL:CHD Risk Coronary Heart Disease Risk Table Men   Women 1/2 Average Risk   3.4   3.3 Average Risk       5.0   4.4 2 X Average Risk   9.6   7.1 3 X Average Risk  23.4   11.0 Use the calculated Patient Ratio above and the CHD Risk Table to determine the patient's CHD Risk. ATP III CLASSIFICATION (LDL): <100     mg/dL   Optimal 100-129  mg/dL   Near or Above Optimal 130-159  mg/dL   Borderline 160-189  mg/dL   High >190     mg/dL   Very High - 83 135(H) 97   HDL >39 mg/dL 71 - 40 58 66   Trlycerides 0 - 149 mg/dL 60 - 75 124 129   Hemoglobin A1c 4.6 - 6.1 % - - - - -   TCO2 0 - 100 mmol/L - 26 - - -       ADL UCSD:     Pulmonary Assessment Scores    Row Name 05/30/16 0900 07/14/16  1118       ADL UCSD   ADL Phase Entry  -    SOB Score total 49 66    Rest 1 1    Walk 3 3    Stairs 4 4    Bath 2 4    Dress 2 3    Shop 3 4       Pulmonary Function Assessment:     Pulmonary Function Assessment - 05/30/16 0900      Pulmonary Function Tests   FVC% 87 %   FEV1% 83 %   FEV1/FVC Ratio 73   RV% 80 %   DLCO% 60 %     Initial Spirometry Results   Comments PFT performed on 12/16/2015     Breath   Bilateral Breath Sounds Clear   Shortness of Breath Yes;Fear of Shortness of Breath;Limiting activity      Exercise Target Goals:    Exercise Program Goal: Individual exercise prescription set with THRR, safety & activity barriers. Participant demonstrates ability to understand and report RPE using BORG scale, to self-measure pulse accurately, and to acknowledge the importance of the exercise prescription.  Exercise Prescription Goal: Starting with aerobic activity 30 plus minutes a day, 3 days per week for initial exercise prescription. Provide home exercise prescription and guidelines that participant acknowledges understanding prior to discharge.  Activity Barriers & Risk Stratification:     Activity Barriers & Cardiac Risk Stratification - 05/30/16 0900      Activity Barriers & Cardiac Risk Stratification   Activity Barriers Shortness of Breath;Deconditioning;Muscular Weakness   Cardiac Risk Stratification Moderate      6 Minute Walk:     6 Minute Walk    Row Name 05/30/16 1535 07/14/16 1058       6 Minute Walk   Phase  - (P)  Mid Program    Distance 533 feet (P)  780 feet    Walk Time 4 minutes (P)  6 minutes    # of Rest Breaks 1  stopped at 4:00 (P)  0    MPH 1.5  -    METS 2.1  -    RPE 18 (P)  13    Perceived Dyspnea   - (P)  5    Symptoms Yes (comment) (P)  No    Comments overall fatigue - legs and breathing  -  Resting HR 67 bpm (P)  81 bpm    Resting BP 154/70 (P)  160/78    Max Ex. HR 123 bpm (P)  130 bpm    Max Ex. BP  160/70 (P)  230/80    2 Minute Post BP 154/72 (P)  170/80      Interval HR   Baseline HR 67 (P)  81    1 Minute HR 105  -    2 Minute HR 108  -    3 Minute HR 117 (P)  128    4 Minute HR 123  -    2 Minute Post HR 72  -    Interval Heart Rate? Yes  -      Interval Oxygen   Interval Oxygen? Yes  -    Baseline Oxygen Saturation % 98 % (P)  98 %    Baseline Liters of Oxygen  - (P)  -  Room Air    1 Minute Oxygen Saturation % 96 %  -    2 Minute Oxygen Saturation % 96 %  -    3 Minute Oxygen Saturation % 93 % (P)  90 %    4 Minute Oxygen Saturation % 93 %  -    6 Minute Oxygen Saturation %  - (P)  89 %  Room air    6 Minute Liters of Oxygen  - (P)  -  Room Air    2 Minute Post Oxygen Saturation % 98 % (P)  99 %  Room Air       Initial Exercise Prescription:     Initial Exercise Prescription - 05/30/16 1500      Date of Initial Exercise RX and Referring Provider   Date 05/30/16   Referring Provider [name]     Treadmill   MPH 1.3   Grade 0   Minutes 15  4/4/4   METs 2     Recumbant Elliptical   Level 1   Watts 50   Minutes 15  can do 4/4/4    METs 2.1     T5 Nustep   Level 1   Watts 30   Minutes 15  4/4/4   METs 2.1     Prescription Details   Frequency (times per week) 3   Duration Progress to 45 minutes of aerobic exercise without signs/symptoms of physical distress     Intensity   THRR 40-80% of Max Heartrate 97-126   Ratings of Perceived Exertion 11-13     Progression   Progression Continue to progress workloads to maintain intensity without signs/symptoms of physical distress.     Resistance Training   Training Prescription Yes   Weight 3   Reps 10-15      Perform Capillary Blood Glucose checks as needed.  Exercise Prescription Changes:     Exercise Prescription Changes    Row Name 06/07/16 1300 06/21/16 1300 07/05/16 1500 07/19/16 1400       Exercise Review   Progression  - Yes Yes Yes      Response to Exercise   Blood  Pressure (Admit) 126/66 130/64 146/82 150/86    Blood Pressure (Exercise) 192/78 174/68 182/64 178/64    Blood Pressure (Exit) 134/52 142/58 126/72 134/66    Heart Rate (Admit) 71 bpm 82 bpm 79 bpm 74 bpm    Heart Rate (Exercise) 111 bpm 116 bpm 110 bpm 115 bpm    Heart Rate (Exit) 89 bpm 86 bpm 97 bpm 79 bpm  Oxygen Saturation (Admit) 97 % 97 % 96 % 99 %    Oxygen Saturation (Exercise) 96 % 93 % 94 % 95 %    Oxygen Saturation (Exit) 98 % 97 % 96 % 97 %    Rating of Perceived Exertion (Exercise) '14 15 14 14    ' Perceived Dyspnea (Exercise) '4 4 4 4    ' Symptoms  - none none none    Duration Progress to 30 minutes of continuous aerobic without signs/symptoms of physical distress Progress to 45 minutes of aerobic exercise without signs/symptoms of physical distress Progress to 45 minutes of aerobic exercise without signs/symptoms of physical distress Progress to 45 minutes of aerobic exercise without signs/symptoms of physical distress    Intensity THRR unchanged THRR unchanged THRR unchanged THRR unchanged      Progression   Progression  - Continue to progress workloads to maintain intensity without signs/symptoms of physical distress. Continue to progress workloads to maintain intensity without signs/symptoms of physical distress. Continue to progress workloads to maintain intensity without signs/symptoms of physical distress.    Average METs  - 2 2.3  -      Resistance Training   Training Prescription Yes Yes Yes  -    Weight '3 3 3  ' -    Reps 10-15 10-15 10-15  -      Interval Training   Interval Training  - No No  -      Treadmill   MPH 1 1.3 1.3  -    Grade 0 0 0  -    Minutes 15  4/6/4 min work with 3 min rest '15  5 5 5  15  10 5  ' -    METs  - 2 2  -      Arm Ergometer   Level  - 3 3  -    Watts  - 18  -  -    Minutes  - 10 15  -    METs  - 2.1 2.5  -      Recumbant Elliptical   Level -  did not do - bothered knee  -  -  -      T5 Nustep   Level '1 2 3  ' -    Watts 30  24  -  -    Minutes 15  5 work 3 rest 3 times 15 15  -    METs  - 2 2.3  -       Exercise Comments:     Exercise Comments    Row Name 06/07/16 1311 06/21/16 1310 07/05/16 1527 07/19/16 1427     Exercise Comments showed [name] a hamstring stretch to do during rest from TM as [name] hamstrings get really tight walking [name] is off to a good start!  He has already moved up to level 3 on the Arm Ergometer.  He is making good progress and we will continue to monitor for progression.  He did stop using the recumbent elliptical as it hurt [name] knees. [name] has been doing well with exercise. He has continued to make progress on [name] workloads. Notnamed is doing well with exercise.  He is up to 15 min continuous on the treadmill now!  We will continue to monitor for progresion       Discharge Exercise Prescription (Final Exercise Prescription Changes):     Exercise Prescription Changes - 07/19/16 1400      Exercise Review   Progression Yes  Response to Exercise   Blood Pressure (Admit) 150/86   Blood Pressure (Exercise) 178/64   Blood Pressure (Exit) 134/66   Heart Rate (Admit) 74 bpm   Heart Rate (Exercise) 115 bpm   Heart Rate (Exit) 79 bpm   Oxygen Saturation (Admit) 99 %   Oxygen Saturation (Exercise) 95 %   Oxygen Saturation (Exit) 97 %   Rating of Perceived Exertion (Exercise) 14   Perceived Dyspnea (Exercise) 4   Symptoms none   Duration Progress to 45 minutes of aerobic exercise without signs/symptoms of physical distress   Intensity THRR unchanged     Progression   Progression Continue to progress workloads to maintain intensity without signs/symptoms of physical distress.       Nutrition:  Target Goals: Understanding of nutrition guidelines, daily intake of sodium <1521m, cholesterol <2066m calories 30% from fat and 7% or less from saturated fats, daily to have 5 or more servings of fruits and vegetables.  Biometrics:    Nutrition Therapy Plan and Nutrition  Goals:     Nutrition Therapy & Goals - 05/30/16 0900      Nutrition Therapy   Diet [name] like to consult with the dietitian. He does all the cooking, cooks green vegtables,low salt, and likes sweets.       Nutrition Discharge: Rate Your Plate Scores:   Psychosocial: Target Goals: Acknowledge presence or absence of depression, maximize coping skills, provide positive support system. Participant is able to verbalize types and ability to use techniques and skills needed for reducing stress and depression.  Initial Review & Psychosocial Screening:     Initial Psych Review & Screening - 05/30/16 0900      Initial Review   Current issues with History of Depression     Family Dynamics   Good Support System? Yes   Comments Good support from [name] family; long history of manic depression; has been admitted to an institution for 8 months, 8 years ago - it was successful therapy and is not on medication at this time.     Barriers   Psychosocial barriers to participate in program The patient should benefit from training in stress management and relaxation.     Screening Interventions   Interventions Encouraged to exercise;Program counselor consult      Quality of Life Scores:     Quality of Life - 05/30/16 0900      Quality of Life Scores   Health/Function Pre 20.25 %   Socioeconomic Pre 19.33 %   Psych/Spiritual Pre 19.33 %   Family Pre 20.6 %   GLOBAL Pre 19.97 %      PHQ-9: Recent Review Flowsheet Data    [facility]   Decreased Interest 3   Down, Depressed, Hopeless 2   PHQ - 2 Score 5   Altered sleeping 3   Tired, decreased energy 3   Change in appetite 1   Feeling bad or failure about yourself  1   Trouble concentrating 0   Moving slowly or fidgety/restless 0   Suicidal thoughts 0   PHQ-9 Score 13   Difficult doing work/chores Somewhat difficult      Psychosocial Evaluation and Intervention:     Psychosocial Evaluation -  06/14/16 1137      Psychosocial Evaluation & Interventions   Interventions Encouraged to exercise with the program and follow exercise prescription;Stress management education;Relaxation education   Comments Counselor met with Nicholas. [name] for initial psychosocial evaluation.  He is a 81  year old gentleman who has COPD and struggles with Sleep Apnea; Obesity and a history of "manic/depression" (Bi-Polar Disorder).  He states [name] support system is strong although he lives alone and reports [name] daughter is [name] primary support person - who lives 30 minutes away.  He admits to a history of "manic/Depression" and reports not being on any meds for the past 2 years.  He reports having been in treatment in [place] for 9 months at one point for alcohol and mood problems and is now able to recognize and cope with [name] triggers and mood issues.  Nicholas. [name] reports [name] mood currently is a "7"  on a scale of 1-10 with 10 being best.  He admits to [name] health being [name] primary stressor currently.  Nicholas. [name] has goals to lose weight, move and breathe better; walk better and increase [name] energy in general while in this program.  Counselor asked Nicholas. [name] if he would benefit from a Counselor for [name] mood  diagnosis currently and he declined, but stated he generally enjoys group therapy.  Counselor will look for groups locally that take [name] insurance to help meet this request.  Counselor will follow with Nicholas. [name] throughout the course of this program.     Continued Psychosocial Services Needed Yes  Nicholas. Teschner will benefit from all of the psychoeducational components of this program, as well as meeting with the dietician to address [name] weight loss goals.       Psychosocial Re-Evaluation:     Psychosocial Re-Evaluation    [place] Name 06/28/16 1229             Psychosocial Re-Evaluation   Comments Counselor follow up with Nicholas. [name] today reporting he is walking better and able to tie [name] shoes now since coming into this  exercise program.  He continues to struggle with shortness of breath and sleep disturbance.  Counselor suggested Nicholas. [name] speak with [name] Dr. or pharmacist about a possible OTC sleep aid that is sustained release to help with this issue.  Counselor will continue to follow with Nicholas. [name].         Education: Education Goals: Education classes will be provided on a weekly basis, covering required topics. Participant will state understanding/return demonstration of topics presented.  Learning Barriers/Preferences:     Learning Barriers/Preferences - 05/30/16 0900      Learning Barriers/Preferences   Learning Barriers None   Learning Preferences Group Instruction;Individual Instruction;Pictoral;Skilled Demonstration;Verbal Instruction;Video;Written Material      Education Topics: Initial Evaluation Education: - Verbal, written and demonstration of respiratory meds, RPE/PD scales, oximetry and breathing techniques. Instruction on use of nebulizers and MDIs: cleaning and proper use, rinsing mouth with steroid doses and importance of monitoring MDI activations. Flowsheet Row Pulmonary Rehab from 07/21/2016 in [facility] Cardiac and Pulmonary Rehab  Date  05/30/16  Educator  LB  Instruction Review Code  2- meets goals/outcomes      General Nutrition Guidelines/Fats and Fiber: -Group instruction provided by verbal, written material, models and posters to present the general guidelines for heart healthy nutrition. Gives an explanation and review of dietary fats and fiber. Flowsheet Row Pulmonary Rehab from 07/21/2016 in [facility] Cardiac and Pulmonary Rehab  Date  07/10/16  Educator  CR  Instruction Review Code  2- meets goals/outcomes      Controlling Sodium/Reading Food Labels: -Group verbal and written material supporting the discussion of sodium use in heart healthy nutrition. Review and explanation with models, verbal and written materials for utilization  of the food label. Flowsheet Row Pulmonary  Rehab from 07/21/2016 in [facility] Cardiac and Pulmonary Rehab  Date  07/17/16  Educator  CR  Instruction Review Code  2- meets goals/outcomes      Exercise Physiology & Risk Factors: - Group verbal and written instruction with models to review the exercise physiology of the cardiovascular system and associated critical values. Details cardiovascular disease risk factors and the goals associated with each risk factor.   Aerobic Exercise & Resistance Training: - Gives group verbal and written discussion on the health impact of inactivity. On the components of aerobic and resistive training programs and the benefits of this training and how to safely progress through these programs.   Flexibility, Balance, General Exercise Guidelines: - Provides group verbal and written instruction on the benefits of flexibility and balance training programs. Provides general exercise guidelines with specific guidelines to those with heart or lung disease. Demonstration and skill practice provided. Flowsheet Row Pulmonary Rehab from 07/21/2016 in [facility] Cardiac and Pulmonary Rehab  Date  07/12/16  Educator  AS  Instruction Review Code  2- meets goals/outcomes      Stress Management: - Provides group verbal and written instruction about the health risks of elevated stress, cause of high stress, and healthy ways to reduce stress.   Depression: - Provides group verbal and written instruction on the correlation between heart/lung disease and depressed mood, treatment options, and the stigmas associated with seeking treatment.   Exercise & Equipment Safety: - Individual verbal instruction and demonstration of equipment use and safety with use of the equipment.   Infection Prevention: - Provides verbal and written material to individual with discussion of infection control including proper hand washing and proper equipment cleaning during exercise session. Flowsheet Row Pulmonary Rehab from 07/21/2016 in [facility]  Cardiac and Pulmonary Rehab  Date  05/30/16  Educator  LB  Instruction Review Code  2- meets goals/outcomes      Falls Prevention: - Provides verbal and written material to individual with discussion of falls prevention and safety. Flowsheet Row Pulmonary Rehab from 07/21/2016 in [facility] Cardiac and Pulmonary Rehab  Date  05/30/16  Educator  LB  Instruction Review Code  2- meets goals/outcomes      Diabetes: - Individual verbal and written instruction to review signs/symptoms of diabetes, desired ranges of glucose level fasting, after meals and with exercise. Advice that pre and post exercise glucose checks will be done for 3 sessions at entry of program.   Chronic Lung Diseases: - Group verbal and written instruction to review new updates, new respiratory medications, new advancements in procedures and treatments. Provide informative websites and "800" numbers of self-education. Flowsheet Row Pulmonary Rehab from 07/21/2016 in [facility] Cardiac and Pulmonary Rehab  Date  07/03/16  Educator  LB  Instruction Review Code  2- meets goals/outcomes      Lung Procedures: - Group verbal and written instruction to describe testing methods done to diagnose lung disease. Review the outcome of test results. Describe the treatment choices: Pulmonary Function Tests, ABGs and oximetry. Flowsheet Row Pulmonary Rehab from 07/21/2016 in [facility] Cardiac and Pulmonary Rehab  Date  07/07/16  Educator  [place]  Instruction Review Code  2- meets goals/outcomes      Energy Conservation: - Provide group verbal and written instruction for methods to conserve energy, plan and organize activities. Instruct on pacing techniques, use of adaptive equipment and posture/positioning to relieve shortness of breath.   Triggers: - Group verbal and written instruction to review types  of environmental controls: home humidity, furnaces, filters, dust mite/pet prevention, HEPA vacuums. To discuss weather changes, air quality and the  benefits of nasal washing.   Exacerbations: - Group verbal and written instruction to provide: warning signs, infection symptoms, calling MD promptly, preventive modes, and value of vaccinations. Review: effective airway clearance, coughing and/or vibration techniques. Create an Sports administrator. Flowsheet Row Pulmonary Rehab from 07/21/2016 in [facility] Cardiac and Pulmonary Rehab  Date  06/12/16  Educator  LB  Instruction Review Code  2- meets goals/outcomes      Oxygen: - Individual and group verbal and written instruction on oxygen therapy. Includes supplement oxygen, available portable oxygen systems, continuous and intermittent flow rates, oxygen safety, concentrators, and Medicare reimbursement for oxygen.   Respiratory Medications: - Group verbal and written instruction to review medications for lung disease. Drug class, frequency, complications, importance of spacers, rinsing mouth after steroid MDI's, and proper cleaning methods for nebulizers. Flowsheet Row Pulmonary Rehab from 07/21/2016 in [facility] Cardiac and Pulmonary Rehab  Date  05/30/16  Educator  LB  Instruction Review Code  2- meets goals/outcomes      AED/CPR: - Group verbal and written instruction with the use of models to demonstrate the basic use of the AED with the basic ABC's of resuscitation. Flowsheet Row Pulmonary Rehab from 07/21/2016 in [facility] Cardiac and Pulmonary Rehab  Date  07/21/16  Educator  CE  Instruction Review Code  2- meets goals/outcomes      Breathing Retraining: - Provides individuals verbal and written instruction on purpose, frequency, and proper technique of diaphragmatic breathing and pursed-lipped breathing. Applies individual practice skills. Flowsheet Row Pulmonary Rehab from 07/21/2016 in [facility] Cardiac and Pulmonary Rehab  Date  05/30/16  Educator  LB  Instruction Review Code  2- meets goals/outcomes      Anatomy and Physiology of the Lungs: - Group verbal and written instruction with the use of  models to provide basic lung anatomy and physiology related to function, structure and complications of lung disease.   Heart Failure: - Group verbal and written instruction on the basics of heart failure: signs/symptoms, treatments, explanation of ejection fraction, enlarged heart and cardiomyopathy.   Sleep Apnea: - Individual verbal and written instruction to review Obstructive Sleep Apnea. Review of risk factors, methods for diagnosing and types of masks and machines for OSA. Flowsheet Row Pulmonary Rehab from 07/21/2016 in [facility] Cardiac and Pulmonary Rehab  Date  05/30/16  Educator  LB  Instruction Review Code  2- meets goals/outcomes      Anxiety: - Provides group, verbal and written instruction on the correlation between heart/lung disease and anxiety, treatment options, and management of anxiety. Flowsheet Row Pulmonary Rehab from 07/21/2016 in [facility] Cardiac and Pulmonary Rehab  Date  06/28/16  Educator  [facility]  Instruction Review Code  2- Meets goals/outcomes      Relaxation: - Provides group, verbal and written instruction about the benefits of relaxation for patients with heart/lung disease. Also provides patients with examples of relaxation techniques.   Knowledge Questionnaire Score:     Knowledge Questionnaire Score - 05/30/16 0900      Knowledge Questionnaire Score   Pre Score 9/10       Core Components/Risk Factors/Patient Goals at Admission:     Personal Goals and Risk Factors at Admission - 05/30/16 0900      Core Components/Risk Factors/Patient Goals on Admission    Weight Management Yes;Obesity   Intervention Weight Management: Develop a combined nutrition and exercise program designed to reach  desired caloric intake, while maintaining appropriate intake of nutrient and fiber, sodium and fats, and appropriate energy expenditure required for the weight goal.;Weight Management: Provide education and appropriate resources to help participant work on and attain  dietary goals.;Weight Management/Obesity: Establish reasonable short term and long term weight goals.;Obesity: Provide education and appropriate resources to help participant work on and attain dietary goals.  Nicholas Heckard would like to consult with the dietitian. He does all the cooking, cooks green vegtables,low salt, and likes sweets.   Admit Weight 308 lb 14.4 oz (140.1 kg)   Goal Weight: Short Term 298 lb (135.2 kg)   Goal Weight: Long Term 160 lb (72.6 kg)   Expected Outcomes Short Term: Continue to assess and modify interventions until short term weight is achieved;Long Term: Adherence to nutrition and physical activity/exercise program aimed toward attainment of established weight goal;Weight Maintenance: Understanding of the daily nutrition guidelines, which includes 25-35% calories from fat, 7% or less cal from saturated fats, less than 280m cholesterol, less than 1.5gm of sodium, & 5 or more servings of fruits and vegetables daily;Weight Loss: Understanding of general recommendations for a balanced deficit meal plan, which promotes 1-2 lb weight loss per week and includes a negative energy balance of [phone] kcal/d;Understanding recommendations for meals to include 15-35% energy as protein, 25-35% energy from fat, 35-60% energy from carbohydrates, less than 2046mof dietary cholesterol, 20-35 gm of total fiber daily;Understanding of distribution of calorie intake throughout the day with the consumption of 4-5 meals/snacks   Sedentary Yes   Intervention Provide advice, education, support and counseling about physical activity/exercise needs.;Develop an individualized exercise prescription for aerobic and resistive training based on initial evaluation findings, risk stratification, comorbidities and participant's personal goals.   Expected Outcomes Achievement of increased cardiorespiratory fitness and enhanced flexibility, muscular endurance and strength shown through measurements of functional  capacity and personal statement of participant.   Increase Strength and Stamina Yes   Intervention Provide advice, education, support and counseling about physical activity/exercise needs.;Develop an individualized exercise prescription for aerobic and resistive training based on initial evaluation findings, risk stratification, comorbidities and participant's personal goals.   Expected Outcomes Achievement of increased cardiorespiratory fitness and enhanced flexibility, muscular endurance and strength shown through measurements of functional capacity and personal statement of participant.   Improve shortness of breath with ADL's Yes   Intervention Provide education, individualized exercise plan and daily activity instruction to help decrease symptoms of SOB with activities of daily living.   Expected Outcomes Short Term: Achieves a reduction of symptoms when performing activities of daily living.   Develop more efficient breathing techniques such as purse lipped breathing and diaphragmatic breathing; and practicing self-pacing with activity Yes   Intervention Provide education, demonstration and support about specific breathing techniuqes utilized for more efficient breathing. Include techniques such as pursed lipped breathing, diaphragmatic breathing and self-pacing activity.   Expected Outcomes Short Term: Participant will be able to demonstrate and use breathing techniques as needed throughout daily activities.   Increase knowledge of respiratory medications and ability to use respiratory devices properly  Yes  Nicholas LaShiehs on Advair MDI and Albuterol MDI. Instructed him in use of spacer. Wears full face mask with CPAP; 16cmH2O; compliant.   Intervention Provide education and demonstration as needed of appropriate use of medications, inhalers, and oxygen therapy.   Expected Outcomes Short Term: Achieves understanding of medications use. Understands that oxygen is a medication prescribed by physician.  Demonstrates appropriate use of inhaler and oxygen therapy.  Hypertension Yes   Intervention Provide education on lifestyle modifcations including regular physical activity/exercise, weight management, moderate sodium restriction and increased consumption of fresh fruit, vegetables, and low fat dairy, alcohol moderation, and smoking cessation.;Monitor prescription use compliance.   Expected Outcomes Short Term: Continued assessment and intervention until BP is < 140/54m HG in hypertensive participants. < 130/868mHG in hypertensive participants with diabetes, heart failure or chronic kidney disease.;Long Term: Maintenance of blood pressure at goal levels.      Core Components/Risk Factors/Patient Goals Review:      Goals and Risk Factor Review    Row Name 06/07/16 1315 06/12/16 1000 06/16/16 1043 07/03/16 1532 07/05/16 1120     Core Components/Risk Factors/Patient Goals Review   Personal Goals Review Develop more efficient breathing techniques such as purse lipped breathing and diaphragmatic breathing and practicing self-pacing with activity. Increase knowledge of respiratory medications and ability to use respiratory devices properly. Weight Management/Obesity;Hypertension Sedentary;Increase Strength and Stamina;Improve shortness of breath with ADL's;Increase knowledge of respiratory medications and ability to use respiratory devices properly.;Develop more efficient breathing techniques such as purse lipped breathing and diaphragmatic breathing and practicing self-pacing with activity. Sedentary;Increase Strength and Stamina;Improve shortness of breath with ADL's   Review reviewed PLB and patient verbally stated understanding Discussed with [name] 2 inhalers. He has a good understanding of [name] Advair - rinses [name] mouth out after use. He rarely uses [name] Albuterol inhaler. He also is very compliant with [name] CPAP and knows how it has improved [name] health. weight up 4 lbs and Instructed  Acen to call his MD's office. He takes a fluid pill every other day.  HoArcherays he really wants to lose weight. Blood pressure is good.  [name] increased [name] exercise goals and is now walking 1045m straight on the treadmill. He states he can now keep [name] apartment clean - doing the mopping and other chores at home. He uses PLB with this activiy. He is very compliant with [name] CPAP and Inhalers , although he rarely uses [name] Albuterol. How[place]oaports he notices more flexibility and can move a lot better.  [name] hamstrings are not nearly as tight as before.  He also notices more energy to do cleaning and moving around whil eat home.   Expected Outcomes  - Continue to be compliant with [name] CPAP and AdvairMDI. Weight decrease.   - HFerguson [name] continue to see improvements in [name] ADLs and overall fitness with contuned participation.   [place] Estatesme 07/10/16 1143 07/12/16 1322           Core Components/Risk Factors/Patient Goals Review   Personal Goals Review Increase Strength and Stamina;Improve shortness of breath with ADL's;Sedentary Sedentary      Review HowMattthewported that he is not currently doing home exercises, but he is con[place] to class to exercise. He has noticed improvements in strength and stamina during ADL's.  Howars stes [name] hamstrings are still tight but not as bad as when he began the program.  He sits most of the time at home, but will try leaving [name] water bottle in the kitchen so he has to get up and move to get it.       Expected Outcomes HowMohannaduld continue to consistantly attend class and use breathing techniques to manage SOB in order to facilitate progression with [name] workloads to help with strength and stamina.  More daily physical activity will help Harold's overall fitness and he will see continues improvement in hamstring flexibility.  Core Components/Risk Factors/Patient Goals at Discharge (Final Review):      Goals and Risk Factor Review - 07/12/16 1322       Core Components/Risk Factors/Patient Goals Review   Personal Goals Review Sedentary   Review Howars stes [name] hamstrings are still tight but not as bad as when he began the program.  He sits most of the time at home, but will try leaving [name] water bottle in the kitchen so he has to get up and move to get it.    Expected Outcomes More daily physical activity will help Harold's overall fitness and he will see continues improvement in hamstring flexibility.      ITP Comments:     ITP Comments    Row Name 06/16/16 1043           ITP Comments weight up 4 lbs and Instructed Phelix to call his MD's office. He takes a fluid pill every other day.           Comments: 30 Day Review

## 2016-07-24 NOTE — Progress Notes (Signed)
Daily Session Note  Patient Details  Name: [name] MRN: [id number] Date of Birth: 30-May-1935 Referring Provider:   Flowsheet Row Pulmonary Rehab from 05/30/2016 in [facility] Cardiac and Pulmonary Rehab  Referring Provider  Raul Del      Encounter Date: 07/24/2016  Check In:     Session Check In - 07/24/16 1240      Check-In   Location [facility]-Cardiac & Pulmonary Rehab   Staff Present Alberteen Sam, MA, ACSM RCEP, Exercise Physiologist;Laureen Owens Shark, BS, RRT, Respiratory Bertis Ruddy, BS, ACSM CEP, Exercise Physiologist   Supervising physician immediately available to respond to emergencies [facility] immediately available ER MD   Physician(s) Burlene Arnt and Kinner   Medication changes reported     No   Fall or balance concerns reported    No   Warm-up and Cool-down Performed on first and last piece of equipment   Resistance Training Performed Yes   VAD Patient? No     Pain Assessment   Currently in Pain? No/denies   Multiple Pain Sites No         Goals Met:  Proper associated with RPD/PD & O2 Sat Independence with exercise equipment Using PLB without cueing & demonstrates good technique Exercise tolerated well Strength training completed today  Goals Unmet:  Not Applicable  Comments: Pt able to follow exercise prescription today without complaint.  Will continue to monitor for progression.    Dr. Emily Filbert is Medical Director for [place] and [facility]monary Rehabilitation.

## 2016-07-26 DIAGNOSIS — J449 Chronic obstructive pulmonary disease, unspecified: Secondary | ICD-10-CM

## 2016-07-26 DIAGNOSIS — G4730 Sleep apnea, unspecified: Secondary | ICD-10-CM | POA: Diagnosis not present

## 2016-07-26 NOTE — Progress Notes (Signed)
Daily Session Note  Patient Details  Name: [name] MRN: [id number] Date of Birth: [birth year] Referring Provider:   Flowsheet Row Pulmonary Rehab from 05/30/2016 in [facility] Cardiac and Pulmonary Rehab  Referring Provider  Raul Del      Encounter Date: 07/26/2016  Check In:     Session Check In - 07/26/16 1238      Check-In   Location [facility]-Cardiac & Pulmonary Rehab   Staff Present Heath Lark, RN, BSN, CCRP;Laureen Owens Shark, BS, RRT, Respiratory Dareen Piano, BA, ACSM CEP, Exercise Physiologist   Supervising physician immediately available to respond to emergencies [facility] immediately available ER MD   Physician(s) Marcelene Butte and Jimmye Norman   Medication changes reported     No   Fall or balance concerns reported    No   Warm-up and Cool-down Performed on first and last piece of equipment   Resistance Training Performed Yes   VAD Patient? No     Pain Assessment   Currently in Pain? No/denies   Multiple Pain Sites No         Goals Met:  Proper associated with RPD/PD & O2 Sat Independence with exercise equipment Exercise tolerated well Strength training completed today  Goals Unmet:  Not Applicable  Comments: Pt able to follow exercise prescription today without complaint.  Will continue to monitor for progression.    Dr. Emily Filbert is Medical Director for [place] and [facility] Pulmonary Rehabilitation.

## 2016-07-31 ENCOUNTER — Encounter: Payer: Medicare Other | Admitting: *Deleted

## 2016-07-31 DIAGNOSIS — J449 Chronic obstructive pulmonary disease, unspecified: Secondary | ICD-10-CM

## 2016-07-31 DIAGNOSIS — G4730 Sleep apnea, unspecified: Secondary | ICD-10-CM

## 2016-07-31 NOTE — Progress Notes (Signed)
Daily Session Note  Patient Details  Name: [name] MRN: [id number] Date of Birth: June 06, 1935 Referring Provider:   Flowsheet Row Pulmonary Rehab from 05/30/2016 in [facility] Cardiac and Pulmonary Rehab  Referring Provider  Raul Del      Encounter Date: 07/31/2016  Check In:     Session Check In - 07/31/16 1021      Check-In   Location [facility]-Cardiac & Pulmonary Rehab   Staff Present Carson Myrtle, BS, RRT, Respiratory Bertis Ruddy, BS, ACSM CEP, Exercise Physiologist;Rebecca Brayton El, DPT, CEEA   Supervising physician immediately available to respond to emergencies [facility] immediately available ER MD   Physician(s) Dr. Bayard Beaver and Dr. Marcelene Butte   Medication changes reported     No   Fall or balance concerns reported    No   Warm-up and Cool-down Performed on first and last piece of equipment   Resistance Training Performed Yes   VAD Patient? No     Pain Assessment   Currently in Pain? No/denies   Multiple Pain Sites No         Goals Met:  Proper associated with RPD/PD & O2 Sat Independence with exercise equipment Exercise tolerated well Strength training completed today  Goals Unmet:  Not Applicable  Comments: Pt able to follow exercise prescription today without complaint.  Will continue to monitor for progression.    Dr. Emily Filbert is Medical Director for [place] and [facility]monary [facility].

## 2016-08-02 ENCOUNTER — Encounter: Payer: Medicare Other | Admitting: *Deleted

## 2016-08-02 DIAGNOSIS — J449 Chronic obstructive pulmonary disease, unspecified: Secondary | ICD-10-CM

## 2016-08-02 DIAGNOSIS — G4730 Sleep apnea, unspecified: Secondary | ICD-10-CM

## 2016-08-02 NOTE — Progress Notes (Signed)
Daily Session Note  Patient Details  Name: [name] MRN: [id number] Date of Birth: [birth year] Referring Provider:   April Manson Pulmonary Rehab from 05/30/2016 in [facility] Cardiac and Pulmonary Rehab  Referring Provider  Raul Del      Encounter Date: 08/02/2016  Check In:     Session Check In - 08/02/16 1007      Check-In   Location [facility]-Cardiac & Pulmonary Rehab   Staff Present Alberteen Sam, MA, ACSM RCEP, Exercise Physiologist;Amanda Oletta Darter, BA, ACSM CEP, Exercise Physiologist;Laureen Owens Shark, BS, RRT, Respiratory Therapist   Supervising physician immediately available to respond to emergencies [facility] immediately available ER MD   Physician(s) Drs. Edd Fabian and McShane   Medication changes reported     No   Fall or balance concerns reported    No   Warm-up and Cool-down Performed as group-led Location manager Performed Yes   VAD Patient? No     Pain Assessment   Currently in Pain? No/denies   Multiple Pain Sites No         Goals Met:  Proper associated with RPD/PD & O2 Sat Independence with exercise equipment Using PLB without cueing & demonstrates good technique Exercise tolerated well Strength training completed today  Goals Unmet:  Not Applicable  Comments: Pt able to follow exercise prescription today without complaint.  Will continue to monitor for progression.  Reviewed home exercise with pt today.  Pt plans to walk at home and go to gym in complex for exercise.  Reviewed THR, pulse, RPE, sign and symptoms, and when to call 911 or MD.  Also discussed weather considerations and indoor options.  Pt voiced understanding.   Dr. Emily Filbert is Medical Director for [facility] and [facility] Pulmonary Rehabilitation.

## 2016-08-07 ENCOUNTER — Encounter: Payer: Medicare Other | Admitting: *Deleted

## 2016-08-07 DIAGNOSIS — G4730 Sleep apnea, unspecified: Secondary | ICD-10-CM | POA: Diagnosis not present

## 2016-08-07 DIAGNOSIS — J449 Chronic obstructive pulmonary disease, unspecified: Secondary | ICD-10-CM

## 2016-08-07 NOTE — Progress Notes (Signed)
Daily Session Note  Patient Details  Name: [name] MRN: [id number] Date of Birth: 11-22-1935 Referring Provider:   Flowsheet Row Pulmonary Rehab from 05/30/2016 in [facility] Cardiac and Pulmonary Rehab  Referring Provider  Raul Del      Encounter Date: 08/07/2016  Check In:     Session Check In - 08/07/16 1051      Check-In   Location [facility]-Cardiac & Pulmonary Rehab   Staff Present Carson Myrtle, BS, RRT, Respiratory Therapist;Jamea Robicheaux Amedeo Plenty, BS, ACSM CEP, Exercise Physiologist;Jessica Luan Pulling, MA, ACSM RCEP, Exercise Physiologist   Supervising physician immediately available to respond to emergencies [facility] immediately available ER MD   Physician(s) Alfred Levins and Jimmye Norman   Medication changes reported     No   Fall or balance concerns reported    No   Warm-up and Cool-down Performed on first and last piece of equipment   Resistance Training Performed Yes   VAD Patient? No     Pain Assessment   Currently in Pain? No/denies   Multiple Pain Sites No         Goals Met:  Proper associated with RPD/PD & O2 Sat Independence with exercise equipment Exercise tolerated well Personal goals reviewed Strength training completed today  Goals Unmet:  Not Applicable  Comments: Pt able to follow exercise prescription today without complaint.  Will continue to monitor for progression.    Dr. Emily Filbert is Medical Director for [place] and [facility] Pulmonary [facility].

## 2016-08-09 ENCOUNTER — Encounter: Payer: Medicare Other | Admitting: *Deleted

## 2016-08-09 DIAGNOSIS — J449 Chronic obstructive pulmonary disease, unspecified: Secondary | ICD-10-CM

## 2016-08-09 DIAGNOSIS — G4730 Sleep apnea, unspecified: Secondary | ICD-10-CM

## 2016-08-09 NOTE — Progress Notes (Signed)
Daily Session Note  Patient Details  Name: ENZIO BUCHLER MRN: [id number] Date of Birth: 03-Aug-1935 Referring Provider:   April Manson Pulmonary Rehab from 05/30/2016 in [facility] Cardiac and Pulmonary Rehab  Referring Provider  Raul Del      Encounter Date: 08/09/2016  Check In:     Session Check In - 08/09/16 1154      Check-In   Location [facility]-Cardiac & Pulmonary Rehab   Staff Present Nyoka Cowden, RN, BSN, Kela Millin, BA, ACSM CEP, Exercise Physiologist;Laureen Janell Quiet, RRT, Respiratory Therapist   Supervising physician immediately available to respond to emergencies [facility] immediately available ER MD   Physician(s) Drs. Raechel Ache and Williams   Medication changes reported     No   Fall or balance concerns reported    No   Warm-up and Cool-down Performed as group-led Location manager Performed Yes   VAD Patient? No     Pain Assessment   Currently in Pain? No/denies   Multiple Pain Sites No         Goals Met:  Proper associated with RPD/PD & O2 Sat Independence with exercise equipment Exercise tolerated well Strength training completed today  Goals Unmet:  Not Applicable  Comments: Pt able to follow exercise prescription today without complaint.  Will continue to monitor for progression.    Dr. Emily Filbert is Medical Director for [place] and [facility] Pulmonary Rehabilitation.

## 2016-08-14 ENCOUNTER — Encounter: Payer: Medicare Other | Admitting: *Deleted

## 2016-08-14 DIAGNOSIS — J449 Chronic obstructive pulmonary disease, unspecified: Secondary | ICD-10-CM

## 2016-08-14 DIAGNOSIS — G4730 Sleep apnea, unspecified: Secondary | ICD-10-CM | POA: Diagnosis not present

## 2016-08-14 NOTE — Progress Notes (Signed)
Daily Session Note  Patient Details  Name: [name] MRN: [id number] Date of Birth: 1935/01/19 Referring Provider:   April Manson Pulmonary Rehab from 05/30/2016 in [facility] Cardiac and Pulmonary Rehab  Referring Provider  Raul Del      Encounter Date: 08/14/2016  Check In:     Session Check In - 08/14/16 1013      Check-In   Location [facility]-Cardiac & Pulmonary Rehab   Staff Present Alberteen Sam, MA, ACSM RCEP, Exercise Physiologist;Kelly Amedeo Plenty, BS, ACSM CEP, Exercise Physiologist;Laureen Owens Shark, BS, RRT, Respiratory Therapist   Supervising physician immediately available to respond to emergencies [facility] immediately available ER MD   Physician(s) Drs. Edd Fabian and Darl Householder   Medication changes reported     No   Fall or balance concerns reported    No   Warm-up and Cool-down Performed as group-led Location manager Performed Yes     Pain Assessment   Currently in Pain? No/denies   Multiple Pain Sites No         Goals Met:  Proper associated with RPD/PD & O2 Sat Independence with exercise equipment Using PLB without cueing & demonstrates good technique Exercise tolerated well Strength training completed today  Goals Unmet:  Not Applicable  Comments: Pt able to follow exercise prescription today without complaint.  Will continue to monitor for progression.    Dr. Emily Filbert is Medical Director for [place] and [facility] Pulmonary [facility].

## 2016-08-16 ENCOUNTER — Encounter: Payer: Medicare Other | Admitting: *Deleted

## 2016-08-16 DIAGNOSIS — G4730 Sleep apnea, unspecified: Secondary | ICD-10-CM | POA: Diagnosis not present

## 2016-08-16 DIAGNOSIS — J449 Chronic obstructive pulmonary disease, unspecified: Secondary | ICD-10-CM

## 2016-08-16 NOTE — Progress Notes (Signed)
Daily Session Note  Patient Details  Name: [name] MRN: [id number] Date of Birth: [birth year] Referring Provider:   April Manson Pulmonary Rehab from 05/30/2016 in [facility] Cardiac and Pulmonary Rehab  Referring Provider  Raul Del      Encounter Date: 08/16/2016  Check In:     Session Check In - 08/16/16 1430      Check-In   Location [facility]-Cardiac & Pulmonary Rehab   Staff Present Nyoka Cowden, RN, BSN, Kela Millin, BA, ACSM CEP, Exercise Physiologist;Laureen Janell Quiet, RRT, Respiratory Therapist   Supervising physician immediately available to respond to emergencies [facility] immediately available ER MD   Physician(s) Drs. Karma Greaser and Alfred Levins   Medication changes reported     No   Fall or balance concerns reported    No   Warm-up and Cool-down Performed as group-led Location manager Performed Yes   VAD Patient? No     Pain Assessment   Currently in Pain? No/denies   Multiple Pain Sites No         Goals Met:  Proper associated with RPD/PD & O2 Sat Independence with exercise equipment Using PLB without cueing & demonstrates good technique Exercise tolerated well Strength training completed today  Goals Unmet:  Not Applicable  Comments: Pt able to follow exercise prescription today without complaint.  Will continue to monitor for progression.    Dr. Emily Filbert is Medical Director for [place] and [facility].

## 2016-08-18 ENCOUNTER — Encounter: Payer: Medicare Other | Attending: Specialist | Admitting: *Deleted

## 2016-08-18 DIAGNOSIS — G4730 Sleep apnea, unspecified: Secondary | ICD-10-CM | POA: Diagnosis present

## 2016-08-18 DIAGNOSIS — J449 Chronic obstructive pulmonary disease, unspecified: Secondary | ICD-10-CM

## 2016-08-18 NOTE — Progress Notes (Signed)
Daily Session Note  Patient Details  Name: [name] MRN: [id number] Date of Birth: 09/19/1935 Referring Provider:   April Manson Pulmonary Rehab from 05/30/2016 in [facility] Cardiac and Pulmonary Rehab  Referring Provider  Raul Del      Encounter Date: 08/18/2016  Check In:     Session Check In - 08/18/16 1029      Check-In   Location [facility]-Cardiac & Pulmonary Rehab   Staff Present Gerlene Burdock, RN, Vickki Hearing, BA, ACSM CEP, Exercise Physiologist;Stacey Blanch Media, RRT, RCP, Respiratory Therapist   Supervising physician immediately available to respond to emergencies [facility] immediately available ER MD   Physician(s) Drs. Jimmye Norman and Archie Balboa   Medication changes reported     No   Fall or balance concerns reported    No   Warm-up and Cool-down Performed as group-led Location manager Performed No   VAD Patient? No     Pain Assessment   Currently in Pain? No/denies   Multiple Pain Sites No         Goals Met:  Proper associated with RPD/PD & O2 Sat Independence with exercise equipment Using PLB without cueing & demonstrates good technique Exercise tolerated well Strength training completed today  Goals Unmet:  Not Applicable  Comments: Pt able to follow exercise prescription today without complaint.  Will continue to monitor for progression.    Dr. Emily Filbert is Medical Director for [place] and [facility].

## 2016-08-22 ENCOUNTER — Encounter: Payer: Self-pay | Admitting: *Deleted

## 2016-08-22 DIAGNOSIS — J449 Chronic obstructive pulmonary disease, unspecified: Secondary | ICD-10-CM

## 2016-08-22 DIAGNOSIS — G4730 Sleep apnea, unspecified: Secondary | ICD-10-CM

## 2016-08-22 NOTE — Progress Notes (Signed)
Pulmonary Individual Treatment Plan  Patient Details  Name: [name] MRN: [id number] Date of Birth: 1935-11-30 Referring Provider:   Flowsheet Row Pulmonary Rehab from 05/30/2016 in [facility] Cardiac and Pulmonary Rehab  Referring Provider  Raul Del      Initial Encounter Date:  Flowsheet Row Pulmonary Rehab from 05/30/2016 in [facility] Cardiac and Pulmonary Rehab  Date  05/30/16  Referring Provider  Raul Del      Visit Diagnosis: Sleep apnea  Morbid obesity due to excess calories ([place])  COPD, mild ([place])  Patient's Home Medications on Admission:  Current Outpatient Prescriptions:  .  albuterol (PROAIR HFA) 108 (90 Base) MCG/ACT inhaler, Inhale 2 puffs into the lungs every 6 (six) hours as needed. Reported on 04/21/2016, Disp: , Rfl:  .  aspirin 81 MG tablet, Take 81 mg by mouth daily. , Disp: , Rfl:  .  atorvastatin (LIPITOR) 40 MG tablet, Take 1 tablet (40 mg total) by mouth daily., Disp: 30 tablet, Rfl: 3 .  BREO ELLIPTA 100-25 MCG/INH AEPB, Inhale 1 puff into the lungs daily. Reported on 04/21/2016, Disp: , Rfl:  .  docusate sodium (COLACE) 50 MG capsule, Take 50 mg by mouth 2 (two) times daily., Disp: , Rfl:  .  fluticasone-salmeterol (ADVAIR HFA) 115-21 MCG/ACT inhaler, Inhale 2 puffs into the lungs 2 (two) times daily., Disp: , Rfl:  .  furosemide (LASIX) 20 MG tablet, Take 20 mg by mouth., Disp: , Rfl:  .  HYDROcodone-acetaminophen (NORCO/VICODIN) 5-325 MG tablet, Take 1-2 tablets by mouth every 6 (six) hours as needed for moderate pain. (Patient not taking: Reported on 04/21/2016), Disp: 30 tablet, Rfl: 0 .  levothyroxine (SYNTHROID, LEVOTHROID) 50 MCG tablet, Take 50 mcg by mouth daily., Disp: , Rfl:  .  losartan (COZAAR) 100 MG tablet, Take 1 tablet (100 mg total) by mouth daily., Disp: 30 tablet, Rfl: 3 .  nitroGLYCERIN (NITROSTAT) 0.4 MG SL tablet, Take 0.4 mg by mouth daily as needed. As needed for chest pain, Disp: , Rfl:  .  ondansetron (ZOFRAN ODT) 4 MG disintegrating tablet,  Take 1 tablet (4 mg total) by mouth every 6 (six) hours as needed for nausea or vomiting., Disp: 20 tablet, Rfl: 0  Past Medical History: Past Medical History:  Diagnosis Date  . Alcoholism ([place])   . Asthma   . Bipolar affective disorder ([place])   . COPD (chronic obstructive pulmonary disease) (HCC)    COPD  . Coronary artery disease February 2005   PCI and Taxus drug-eluting stent placement to the distal RCA (3.5 x 12 mm)  . Degenerative joint disease   . Essential hypertension   . Hyperlipidemia   . Hypertension   . Prostate CA [facility])    prostate ca dx 31 ys ago;  . Prostate CA [facility])    prostate ca dx 20 yrs ago  . PVD (peripheral vascular disease) ([place])   . Tobacco abuse     Tobacco Use: History  Smoking Status  . Former Smoker  . Packs/day: 1.00  . Years: 50.00  . Types: Cigarettes  Smokeless Tobacco  . Not on file    Labs: Recent Review Flowsheet Data    Labs for ITP Cardiac and Pulmonary Rehab Latest Ref Rng & Units 10/30/2010 01/04/2011 05/31/2014 03/02/2016 04/26/2016   Cholestrol 100 - 199 mg/dL 161 ATP III CLASSIFICATION: <200     mg/dL   Desirable 200-239  mg/dL   Borderline High >=240    mg/dL   High - 138 218(H) 189  LDLCALC 0 - 99 mg/dL 78 Total Cholesterol/HDL:CHD Risk Coronary Heart Disease Risk Table Men   Women 1/2 Average Risk   3.4   3.3 Average Risk       5.0   4.4 2 X Average Risk   9.6   7.1 3 X Average Risk  23.4   11.0 Use the calculated Patient Ratio above and the CHD Risk Table to determine the patient's CHD Risk. ATP III CLASSIFICATION (LDL): <100     mg/dL   Optimal 100-129  mg/dL   Near or Above Optimal 130-159  mg/dL   Borderline 160-189  mg/dL   High >190     mg/dL   Very High - 83 135(H) 97   HDL >39 mg/dL 71 - 40 58 66   Trlycerides 0 - 149 mg/dL 60 - 75 124 129   Hemoglobin A1c 4.6 - 6.1 % - - - - -   TCO2 0 - 100 mmol/L - 26 - - -       ADL UCSD:     Pulmonary Assessment Scores    Row Name 05/30/16 0900 07/14/16  1118       ADL UCSD   ADL Phase Entry  -    SOB Score total 49 66    Rest 1 1    Walk 3 3    Stairs 4 4    Bath 2 4    Dress 2 3    Shop 3 4       Pulmonary Function Assessment:     Pulmonary Function Assessment - 05/30/16 0900      Pulmonary Function Tests   FVC% 87 %   FEV1% 83 %   FEV1/FVC Ratio 73   RV% 80 %   DLCO% 60 %     Initial Spirometry Results   Comments PFT performed on 12/16/2015     Breath   Bilateral Breath Sounds Clear   Shortness of Breath Yes;Fear of Shortness of Breath;Limiting activity      Exercise Target Goals:    Exercise Program Goal: Individual exercise prescription set with THRR, safety & activity barriers. Participant demonstrates ability to understand and report RPE using BORG scale, to self-measure pulse accurately, and to acknowledge the importance of the exercise prescription.  Exercise Prescription Goal: Starting with aerobic activity 30 plus minutes a day, 3 days per week for initial exercise prescription. Provide home exercise prescription and guidelines that participant acknowledges understanding prior to discharge.  Activity Barriers & Risk Stratification:     Activity Barriers & Cardiac Risk Stratification - 05/30/16 0900      Activity Barriers & Cardiac Risk Stratification   Activity Barriers Shortness of Breath;Deconditioning;Muscular Weakness   Cardiac Risk Stratification Moderate      6 Minute Walk:     6 Minute Walk    Row Name 05/30/16 1535 07/14/16 1058       6 Minute Walk   Phase  - (P)  Mid Program    Distance 533 feet (P)  780 feet    Walk Time 4 minutes (P)  6 minutes    # of Rest Breaks 1  stopped at 4:00 (P)  0    MPH 1.5  -    METS 2.1  -    RPE 18 (P)  13    Perceived Dyspnea   - (P)  5    Symptoms Yes (comment) (P)  No    Comments overall fatigue - legs and breathing  -  Resting HR 67 bpm (P)  81 bpm    Resting BP 154/70 (P)  160/78    Max Ex. HR 123 bpm (P)  130 bpm    Max Ex. BP  160/70 (P)  230/80    2 Minute Post BP 154/72 (P)  170/80      Interval HR   Baseline HR 67 (P)  81    1 Minute HR 105  -    2 Minute HR 108  -    3 Minute HR 117 (P)  128    4 Minute HR 123  -    2 Minute Post HR 72  -    Interval Heart Rate? Yes  -      Interval Oxygen   Interval Oxygen? Yes  -    Baseline Oxygen Saturation % 98 % (P)  98 %    Baseline Liters of Oxygen  - (P)  -  Room Air    1 Minute Oxygen Saturation % 96 %  -    2 Minute Oxygen Saturation % 96 %  -    3 Minute Oxygen Saturation % 93 % (P)  90 %    4 Minute Oxygen Saturation % 93 %  -    6 Minute Oxygen Saturation %  - (P)  89 %  Room air    6 Minute Liters of Oxygen  - (P)  -  Room Air    2 Minute Post Oxygen Saturation % 98 % (P)  99 %  Room Air       Initial Exercise Prescription:     Initial Exercise Prescription - 05/30/16 1500      Date of Initial Exercise RX and Referring Provider   Date 05/30/16   Referring Provider Raul Del     Treadmill   MPH 1.3   Grade 0   Minutes 15  4/4/4   METs 2     Recumbant Elliptical   Level 1   Watts 50   Minutes 15  can do 4/4/4    METs 2.1     T5 Nustep   Level 1   Watts 30   Minutes 15  4/4/4   METs 2.1     Prescription Details   Frequency (times per week) 3   Duration Progress to 45 minutes of aerobic exercise without signs/symptoms of physical distress     Intensity   THRR 40-80% of Max Heartrate 97-126   Ratings of Perceived Exertion 11-13     Progression   Progression Continue to progress workloads to maintain intensity without signs/symptoms of physical distress.     Resistance Training   Training Prescription Yes   Weight 3   Reps 10-15      Perform Capillary Blood Glucose checks as needed.  Exercise Prescription Changes:     Exercise Prescription Changes    Row Name 06/07/16 1300 06/21/16 1300 07/05/16 1500 07/19/16 1400 08/02/16 1400     Exercise Review   Progression  - Yes Yes Yes Yes     Response to Exercise    Blood Pressure (Admit) 126/66 130/64 146/82 150/86 140/80   Blood Pressure (Exercise) 192/78 174/68 182/64 178/64 164/78   Blood Pressure (Exit) 134/52 142/58 126/72 134/66 142/78   Heart Rate (Admit) 71 bpm 82 bpm 79 bpm 74 bpm 86 bpm   Heart Rate (Exercise) 111 bpm 116 bpm 110 bpm 115 bpm 123 bpm   Heart Rate (Exit) 89 bpm 86 bpm 97 bpm 79  bpm 84 bpm   Oxygen Saturation (Admit) 97 % 97 % 96 % 99 % 97 %   Oxygen Saturation (Exercise) 96 % 93 % 94 % 95 % 93 %   Oxygen Saturation (Exit) 98 % 97 % 96 % 97 % 97 %   Rating of Perceived Exertion (Exercise) _0 Perceived Dyspnea (Exercise) _1 Symptoms  - none none none SOB on treadmill    Comments  -  -  -  - Home Exercise Guidelines given 08/02/16   Duration Progress to 30 minutes of continuous aerobic without signs/symptoms of physical distress Progress to 45 minutes of aerobic exercise without signs/symptoms of physical distress Progress to 45 minutes of aerobic exercise without signs/symptoms of physical distress Progress to 45 minutes of aerobic exercise without signs/symptoms of physical distress Progress to 45 minutes of aerobic exercise without signs/symptoms of physical distress   Intensity _2      Progression   Progression  - Continue to progress workloads to maintain intensity without signs/symptoms of physical distress. Continue to progress workloads to maintain intensity without signs/symptoms of physical distress. Continue to progress workloads to maintain intensity without signs/symptoms of physical distress. Continue to progress workloads to maintain intensity without signs/symptoms of physical distress.   Average METs  - 2 2.3  - 2.29     Resistance Training   Training Prescription Yes Yes Yes  - Yes   Weight _3 - 4 lbs   Reps 10-15 10-15 10-15  - 10-15     Interval Training   Interval Training  - No No  - No     Treadmill   MPH 1  1.3 1.3  - 1.4   Grade 0 0 0  - 0   Minutes 15  4/6/4 min work with 3 min rest _4 - 15   METs  - 2 2  - 2.07     Arm Ergometer   Level  - 3 3  - 4   Watts  - 18  -  -  -   Minutes  - 10 15  - 15   METs  - 2.1 2.5  - 2.3     Recumbant Elliptical   Level -  did not do - bothered knee  -  -  -  -     T5 Nustep   Level _5 - 4   Watts 30 24  -  -  -   Minutes 15  5 work 3 rest 3 times 15 15  - 15   METs  - 2 2.3  - 2     Home Exercise Plan   Plans to continue exercise at  -  -  -  - Home  walking and complex in gym   Frequency  -  -  -  - Add 1 additional day to program exercise sessions.   [place] Name 08/16/16 1400             Exercise Review   Progression Yes         Response to Exercise   Blood Pressure (Admit) 132/74       Blood Pressure (Exercise) 164/72       Blood Pressure (Exit) 128/66       Heart Rate (Admit) 116 bpm  Heart Rate (Exercise) 132 bpm       Heart Rate (Exit) 96 bpm       Oxygen Saturation (Admit) 94 %       Oxygen Saturation (Exercise) 95 %       Oxygen Saturation (Exit) 97 %       Rating of Perceived Exertion (Exercise) 13       Perceived Dyspnea (Exercise) 4       Symptoms SOB on treadmill        Comments Home Exercise Guidelines given 08/02/16       Duration Progress to 45 minutes of aerobic exercise without signs/symptoms of physical distress       Intensity THRR unchanged         Progression   Progression Continue to progress workloads to maintain intensity without signs/symptoms of physical distress.       Average METs 2.66         Resistance Training   Training Prescription Yes       Weight 4 lbs       Reps 10-15         Interval Training   Interval Training No         Treadmill   MPH 1.4       Grade 0       Minutes 15       METs 2.07         Arm Ergometer   Level 5       Watts -  50 rpm       Minutes 15       METs 3.2         T5 Nustep   Level 5       Minutes 15       METs 2.7          Home Exercise Plan   Plans to continue exercise at Home  walking and complex in gym       Frequency Add 1 additional day to program exercise sessions.          Exercise Comments:     Exercise Comments    Row Name 06/07/16 1311 06/21/16 1310 07/05/16 1527 07/19/16 1427 08/02/16 1115   Exercise Comments showed Mr Mclinden a hamstring stretch to do during rest from TM as his hamstrings get really tight walking Bucky is off to a good start!  He has already moved up to level 3 on the Arm Ergometer.  He is making good progress and we will continue to monitor for progression.  He did stop using the recumbent elliptical as it hurt his knees. Dionte has been doing well with exercise. He has continued to make progress on his workloads. Matvey is doing well with exercise.  He is up to 15 min continuous on the treadmill now!  We will continue to monitor for progresion Reviewed home exercise with pt today.  Pt plans to walk at home and go to gym in complex for exercise.  Reviewed THR, pulse, RPE, sign and symptoms, and when to call 911 or MD.  Also discussed weather considerations and indoor options.  Pt voiced understanding.   [place] Name 08/02/16 1413 08/16/16 1432         Exercise Comments Tyeler is doing well with exercise.  He has just bumped up his treadmill to 1.4 mph.  We will continue to monitor for progression. Kentravious continues to do well with exercise.  He is now up to level  5 on the arm crank.  We will continue to monitor for progression.          Discharge Exercise Prescription (Final Exercise Prescription Changes):     Exercise Prescription Changes - 08/16/16 1400      Exercise Review   Progression Yes     Response to Exercise   Blood Pressure (Admit) 132/74   Blood Pressure (Exercise) 164/72   Blood Pressure (Exit) 128/66   Heart Rate (Admit) 116 bpm   Heart Rate (Exercise) 132 bpm   Heart Rate (Exit) 96 bpm   Oxygen Saturation (Admit) 94 %   Oxygen Saturation (Exercise) 95 %    Oxygen Saturation (Exit) 97 %   Rating of Perceived Exertion (Exercise) 13   Perceived Dyspnea (Exercise) 4   Symptoms SOB on treadmill    Comments Home Exercise Guidelines given 08/02/16   Duration Progress to 45 minutes of aerobic exercise without signs/symptoms of physical distress   Intensity THRR unchanged     Progression   Progression Continue to progress workloads to maintain intensity without signs/symptoms of physical distress.   Average METs 2.66     Resistance Training   Training Prescription Yes   Weight 4 lbs   Reps 10-15     Interval Training   Interval Training No     Treadmill   MPH 1.4   Grade 0   Minutes 15   METs 2.07     Arm Ergometer   Level 5   Watts --  50 rpm   Minutes 15   METs 3.2     T5 Nustep   Level 5   Minutes 15   METs 2.7     Home Exercise Plan   Plans to continue exercise at Home  walking and complex in gym   Frequency Add 1 additional day to program exercise sessions.       Nutrition:  Target Goals: Understanding of nutrition guidelines, daily intake of sodium '1500mg'$ , cholesterol '200mg'$ , calories 30% from fat and 7% or less from saturated fats, daily to have 5 or more servings of fruits and vegetables.  Biometrics:    Nutrition Therapy Plan and Nutrition Goals:     Nutrition Therapy & Goals - 05/30/16 0900      Nutrition Therapy   Diet Mr Edelson would like to consult with the dietitian. He does all the cooking, cooks green vegtables,low salt, and likes sweets.       Nutrition Discharge: Rate Your Plate Scores:   Psychosocial: Target Goals: Acknowledge presence or absence of depression, maximize coping skills, provide positive support system. Participant is able to verbalize types and ability to use techniques and skills needed for reducing stress and depression.  Initial Review & Psychosocial Screening:     Initial Psych Review & Screening - 05/30/16 0900      Initial Review   Current issues with History of  Depression     Family Dynamics   Good Support System? Yes   Comments Good support from his family; long history of manic depression; has been admitted to an institution for 8 months, 8 years ago - it was successful therapy and is not on medication at this time.     Barriers   Psychosocial barriers to participate in program The patient should benefit from training in stress management and relaxation.     Screening Interventions   Interventions Encouraged to exercise;Program counselor consult      Quality of Life Scores:     Quality  of Life - 05/30/16 0900      Quality of Life Scores   Health/Function Pre 20.25 %   Socioeconomic Pre 19.33 %   Psych/Spiritual Pre 19.33 %   Family Pre 20.6 %   GLOBAL Pre 19.97 %      PHQ-9: Recent Review Flowsheet Data    Depression screen [facility] 2/9 05/30/2016   Decreased Interest 3   Down, Depressed, Hopeless 2   PHQ - 2 Score 5   Altered sleeping 3   Tired, decreased energy 3   Change in appetite 1   Feeling bad or failure about yourself  1   Trouble concentrating 0   Moving slowly or fidgety/restless 0   Suicidal thoughts 0   PHQ-9 Score 13   Difficult doing work/chores Somewhat difficult      Psychosocial Evaluation and Intervention:     Psychosocial Evaluation - 06/14/16 1137      Psychosocial Evaluation & Interventions   Interventions Encouraged to exercise with the program and follow exercise prescription;Stress management education;Relaxation education   Comments Counselor met with Mr. Shin today for initial psychosocial evaluation.  He is a 81 year old gentleman who has COPD and struggles with Sleep Apnea; Obesity and a history of "manic/depression" (Bi-Polar Disorder).  He states his support system is strong although he lives alone and reports his daughter is his primary support person - who lives 30 minutes away.  He admits to a history of "manic/Depression" and reports not being on any meds for the past 2 years.  He reports  having been in treatment in [place] for 9 months at one point for alcohol and mood problems and is now able to recognize and cope with his triggers and mood issues.  Mr. Fjeld reports his mood currently is a "7"  on a scale of 1-10 with 10 being best.  He admits to his health being his primary stressor currently.  Mr. Milnes has goals to lose weight, move and breathe better; walk better and increase his energy in general while in this program.  Counselor asked Mr. Swails if he would benefit from a Counselor for his mood  diagnosis currently and he declined, but stated he generally enjoys group therapy.  Counselor will look for groups locally that take his insurance to help meet this request.  Counselor will follow with Mr. Abruzzo throughout the course of this program.     Continued Psychosocial Services Needed Yes  Mr. Hammerschmidt will benefit from all of the psychoeducational components of this program, as well as meeting with the dietician to address his weight loss goals.       Psychosocial Re-Evaluation:     Psychosocial Re-Evaluation    [place] Name 06/28/16 1229 07/31/16 1123           Psychosocial Re-Evaluation   Comments Counselor follow up with Mr. Schreifels today reporting he is walking better and able to tie his shoes now since coming into this exercise program.  He continues to struggle with shortness of breath and sleep disturbance.  Counselor suggested Mr. Rosamond speak with his Dr. or pharmacist about a possible OTC sleep aid that is sustained release to help with this issue.  Counselor will continue to follow with Mr. Bunch. Counselor follow up with Mr. Law reporting increased energy and flexibility since coming into this program.  He states his mood continues to be a problem and it is "up and down" but mostly "down."  However, Mr. Mangiaracina states he "won't take meds  for this" since he was on so many at one time.  Counselor encouraged him to speak with his Dr. about trying just one/day medication for mood to  help stabilize this.  He is scheduled to see the Dr. tomorrow about his hamstrings that he complains having been hurting a great deal lately.  Counselor will continue to follow and commended Mr. Sanfilippo on the progress made so far.          Education: Education Goals: Education classes will be provided on a weekly basis, covering required topics. Participant will state understanding/return demonstration of topics presented.  Learning Barriers/Preferences:     Learning Barriers/Preferences - 05/30/16 0900      Learning Barriers/Preferences   Learning Barriers None   Learning Preferences Group Instruction;Individual Instruction;Pictoral;Skilled Demonstration;Verbal Instruction;Video;Written Material      Education Topics: Initial Evaluation Education: - Verbal, written and demonstration of respiratory meds, RPE/PD scales, oximetry and breathing techniques. Instruction on use of nebulizers and MDIs: cleaning and proper use, rinsing mouth with steroid doses and importance of monitoring MDI activations. Flowsheet Row Pulmonary Rehab from 08/18/2016 in [facility] Cardiac and Pulmonary Rehab  Date  05/30/16  Educator  LB  Instruction Review Code  2- meets goals/outcomes      General Nutrition Guidelines/Fats and Fiber: -Group instruction provided by verbal, written material, models and posters to present the general guidelines for heart healthy nutrition. Gives an explanation and review of dietary fats and fiber. Flowsheet Row Pulmonary Rehab from 08/18/2016 in [facility] Cardiac and Pulmonary Rehab  Date  07/10/16  Educator  CR  Instruction Review Code  2- meets goals/outcomes      Controlling Sodium/Reading Food Labels: -Group verbal and written material supporting the discussion of sodium use in heart healthy nutrition. Review and explanation with models, verbal and written materials for utilization of the food label. Flowsheet Row Pulmonary Rehab from 08/18/2016 in [facility] Cardiac and Pulmonary Rehab   Date  07/17/16  Educator  CR  Instruction Review Code  2- meets goals/outcomes      Exercise Physiology & Risk Factors: - Group verbal and written instruction with models to review the exercise physiology of the cardiovascular system and associated critical values. Details cardiovascular disease risk factors and the goals associated with each risk factor. Flowsheet Row Pulmonary Rehab from 08/18/2016 in [facility] Cardiac and Pulmonary Rehab  Date  08/09/16  Educator  AS  Instruction Review Code  2- meets goals/outcomes      Aerobic Exercise & Resistance Training: - Gives group verbal and written discussion on the health impact of inactivity. On the components of aerobic and resistive training programs and the benefits of this training and how to safely progress through these programs.   Flexibility, Balance, General Exercise Guidelines: - Provides group verbal and written instruction on the benefits of flexibility and balance training programs. Provides general exercise guidelines with specific guidelines to those with heart or lung disease. Demonstration and skill practice provided. Flowsheet Row Pulmonary Rehab from 08/18/2016 in [facility] Cardiac and Pulmonary Rehab  Date  07/12/16  Educator  AS  Instruction Review Code  2- meets goals/outcomes      Stress Management: - Provides group verbal and written instruction about the health risks of elevated stress, cause of high stress, and healthy ways to reduce stress.   Depression: - Provides group verbal and written instruction on the correlation between heart/lung disease and depressed mood, treatment options, and the stigmas associated with seeking treatment.   Exercise & Equipment Safety: - Individual  verbal instruction and demonstration of equipment use and safety with use of the equipment.   Infection Prevention: - Provides verbal and written material to individual with discussion of infection control including proper hand washing  and proper equipment cleaning during exercise session. Flowsheet Row Pulmonary Rehab from 08/18/2016 in [facility] Cardiac and Pulmonary Rehab  Date  05/30/16  Educator  LB  Instruction Review Code  2- meets goals/outcomes      Falls Prevention: - Provides verbal and written material to individual with discussion of falls prevention and safety. Flowsheet Row Pulmonary Rehab from 08/18/2016 in [facility] Cardiac and Pulmonary Rehab  Date  05/30/16  Educator  LB  Instruction Review Code  2- meets goals/outcomes      Diabetes: - Individual verbal and written instruction to review signs/symptoms of diabetes, desired ranges of glucose level fasting, after meals and with exercise. Advice that pre and post exercise glucose checks will be done for 3 sessions at entry of program.   Chronic Lung Diseases: - Group verbal and written instruction to review new updates, new respiratory medications, new advancements in procedures and treatments. Provide informative websites and "800" numbers of self-education. Flowsheet Row Pulmonary Rehab from 08/18/2016 in [facility] Cardiac and Pulmonary Rehab  Date  07/03/16  Educator  LB  Instruction Review Code  2- meets goals/outcomes      Lung Procedures: - Group verbal and written instruction to describe testing methods done to diagnose lung disease. Review the outcome of test results. Describe the treatment choices: Pulmonary Function Tests, ABGs and oximetry. Flowsheet Row Pulmonary Rehab from 08/18/2016 in [facility] Cardiac and Pulmonary Rehab  Date  07/07/16  Educator  SJ  Instruction Review Code  2- meets goals/outcomes      Energy Conservation: - Provide group verbal and written instruction for methods to conserve energy, plan and organize activities. Instruct on pacing techniques, use of adaptive equipment and posture/positioning to relieve shortness of breath.   Triggers: - Group verbal and written instruction to review types of environmental controls: home humidity,  furnaces, filters, dust mite/pet prevention, HEPA vacuums. To discuss weather changes, air quality and the benefits of nasal washing.   Exacerbations: - Group verbal and written instruction to provide: warning signs, infection symptoms, calling MD promptly, preventive modes, and value of vaccinations. Review: effective airway clearance, coughing and/or vibration techniques. Create an Sport and exercise psychologist. Flowsheet Row Pulmonary Rehab from 08/18/2016 in [facility] Cardiac and Pulmonary Rehab  Date  06/12/16  Educator  LB  Instruction Review Code  2- meets goals/outcomes      Oxygen: - Individual and group verbal and written instruction on oxygen therapy. Includes supplement oxygen, available portable oxygen systems, continuous and intermittent flow rates, oxygen safety, concentrators, and Medicare reimbursement for oxygen.   Respiratory Medications: - Group verbal and written instruction to review medications for lung disease. Drug class, frequency, complications, importance of spacers, rinsing mouth after steroid MDI's, and proper cleaning methods for nebulizers. Flowsheet Row Pulmonary Rehab from 08/18/2016 in [facility] Cardiac and Pulmonary Rehab  Date  05/30/16  Educator  LB  Instruction Review Code  2- meets goals/outcomes      AED/CPR: - Group verbal and written instruction with the use of models to demonstrate the basic use of the AED with the basic ABC's of resuscitation. Flowsheet Row Pulmonary Rehab from 08/18/2016 in [facility] Cardiac and Pulmonary Rehab  Date  07/21/16  Educator  CE  Instruction Review Code  2- meets goals/outcomes      Breathing Retraining: - Provides individuals  verbal and written instruction on purpose, frequency, and proper technique of diaphragmatic breathing and pursed-lipped breathing. Applies individual practice skills. Flowsheet Row Pulmonary Rehab from 08/18/2016 in [facility] Cardiac and Pulmonary Rehab  Date  05/30/16  Educator  LB  Instruction Review Code  2- meets  goals/outcomes      Anatomy and Physiology of the Lungs: - Group verbal and written instruction with the use of models to provide basic lung anatomy and physiology related to function, structure and complications of lung disease.   Heart Failure: - Group verbal and written instruction on the basics of heart failure: signs/symptoms, treatments, explanation of ejection fraction, enlarged heart and cardiomyopathy. Flowsheet Row Pulmonary Rehab from 08/18/2016 in [facility] Cardiac and Pulmonary Rehab  Date  08/18/16  Educator  CE  Instruction Review Code  2- meets goals/outcomes      Sleep Apnea: - Individual verbal and written instruction to review Obstructive Sleep Apnea. Review of risk factors, methods for diagnosing and types of masks and machines for OSA. Flowsheet Row Pulmonary Rehab from 08/18/2016 in [facility] Cardiac and Pulmonary Rehab  Date  05/30/16  Educator  LB  Instruction Review Code  2- meets goals/outcomes      Anxiety: - Provides group, verbal and written instruction on the correlation between heart/lung disease and anxiety, treatment options, and management of anxiety. Flowsheet Row Pulmonary Rehab from 08/18/2016 in [facility] Cardiac and Pulmonary Rehab  Date  06/28/16  Educator  [facility]  Instruction Review Code  2- Meets goals/outcomes      Relaxation: - Provides group, verbal and written instruction about the benefits of relaxation for patients with heart/lung disease. Also provides patients with examples of relaxation techniques.   Knowledge Questionnaire Score:     Knowledge Questionnaire Score - 05/30/16 0900      Knowledge Questionnaire Score   Pre Score 9/10       Core Components/Risk Factors/Patient Goals at Admission:     Personal Goals and Risk Factors at Admission - 08/18/16 1057      Core Components/Risk Factors/Patient Goals on Admission    Weight Management Weight Loss   Intervention Weight Management: Develop a combined nutrition and exercise program  designed to reach desired caloric intake, while maintaining appropriate intake of nutrient and fiber, sodium and fats, and appropriate energy expenditure required for the weight goal.      Core Components/Risk Factors/Patient Goals Review:      Goals and Risk Factor Review    Row Name 06/07/16 1315 06/12/16 1000 06/16/16 1043 07/03/16 1532 07/05/16 1120     Core Components/Risk Factors/Patient Goals Review   Personal Goals Review Develop more efficient breathing techniques such as purse lipped breathing and diaphragmatic breathing and practicing self-pacing with activity. Increase knowledge of respiratory medications and ability to use respiratory devices properly. Weight Management/Obesity;Hypertension Sedentary;Increase Strength and Stamina;Improve shortness of breath with ADL's;Increase knowledge of respiratory medications and ability to use respiratory devices properly.;Develop more efficient breathing techniques such as purse lipped breathing and diaphragmatic breathing and practicing self-pacing with activity. Sedentary;Increase Strength and Stamina;Improve shortness of breath with ADL's   Review reviewed PLB and patient verbally stated understanding Discussed with Mr Roskos about his 2 inhalers. He has a good understanding of his Advair - rinses his mouth out after use. He rarely uses his Albuterol inhaler. He also is very compliant with his CPAP and knows how it has improved his health. weight up 4 lbs and Instructed Najee to call his MD's office. He takes a fluid pill every other  day.  Susana says he really wants to lose weight. Blood pressure is good.  Mr Trostle has increased his exercise goals and is now walking 27mns straight on the treadmill. He states he can now keep his apartment clean - doing the mopping and other chores at home. He uses PLB with this activiy. He is very compliant with his CPAP and Inhalers , although he rarely uses his Albuterol. HColesonreports he notices more flexibility  and can move a lot better.  His hamstrings are not nearly as tight as before.  He also notices more energy to do cleaning and moving around whil eat home.   Expected Outcomes  - Continue to be compliant with his CPAP and AdvairMDI. Weight decrease.   -Leevon Uppermanwill continue to see improvements in his ADLs and overall fitness with contuned participation.   RWallburgName 07/10/16 1143 07/12/16 1322 08/07/16 1052 08/08/16 0813 08/18/16 1057     Core Components/Risk Factors/Patient Goals Review   Personal Goals Review Increase Strength and Stamina;Improve shortness of breath with ADL's;Sedentary Sedentary Increase Strength and Stamina;Sedentary  - Weight Management/Obesity   Review HVilasreported that he is not currently doing home exercises, but he is cMalays[place]oming to class to exercise. He has noticed improvements in strength and stamina during ADL's.  Howars stes his hamstrings are still tight but not as bad as when he began the program.  He sits most of the time at home, but will try leaving his water bottle in the kitchen so he has to get up and move to get it.  HBurmanwas going to check into the fitness complex where he lives this weekend but had a depressive episode this weekend and was not able to leave the house to check into this option. He did state that he was noticing improvements in strength and stamina.  Mr LJuncajhas a good understanding of his MDI's and is compliant with the medications. He uses PLB with good technique with his exercise goals and activities around the house. Since he has started [facility], Mr LDebruynehas more energy and less shortness of breath. HLavoyreports that the doctor started him on a diet pill last week so HPearsehopes to lose weight. HCameronsaid he quit smoking 1 year ago and his weight went up. HMarinsaid the doctor didn't say anything about his blood pressure. Upon arrival today. 1146.8   Expected Outcomes HWoodfinwould continue to consistantly attend class and use  breathing techniques to manage SOB in order to facilitate progression with his workloads to help with strength and stamina.  More daily physical activity will help Harold's overall fitness and he will see continues improvement in hamstring flexibility. HAydinwould be able to check into the fitness complex at his residence to see if the equipment would be suitable for him to use on his days off of pulmonary rehab.   - Cont to be healthy or healthier.      Core Components/Risk Factors/Patient Goals at Discharge (Final Review):      Goals and Risk Factor Review - 08/18/16 1057      Core Components/Risk Factors/Patient Goals Review   Personal Goals Review Weight Management/Obesity   Review HAzahelreports that the doctor started him on a diet pill last week so HBrandleyhopes to lose weight. HMihailosaid he quit smoking 1 year ago and his weight went up. HVinalsaid the doctor didn't say anything about his blood pressure. Upon arrival today. 1146.8   Expected Outcomes  Cont to be healthy or healthier.      ITP Comments:     ITP Comments    Row Name 06/16/16 1043 08/18/16 1056         ITP Comments weight up 4 lbs and Instructed Jamont to call his MD's office. He takes a fluid pill every other day.  Arcadio reports that the doctor started him on a diet pill last week so Cope hopes to lose weight. Jiyaan said he quit smoking 1 year ago and his weight went up.          Comments: 30 Day Review

## 2016-08-23 ENCOUNTER — Encounter: Payer: Medicare Other | Admitting: *Deleted

## 2016-08-23 DIAGNOSIS — G4730 Sleep apnea, unspecified: Secondary | ICD-10-CM | POA: Diagnosis not present

## 2016-08-23 DIAGNOSIS — J449 Chronic obstructive pulmonary disease, unspecified: Secondary | ICD-10-CM

## 2016-08-23 NOTE — Progress Notes (Signed)
Daily Session Note  Patient Details  Name: [name] MRN: [id number] Date of Birth: 04/11/1935 Referring Provider:   April Manson Pulmonary Rehab from 05/30/2016 in [facility] Cardiac and Pulmonary Rehab  Referring Provider  Raul Del      Encounter Date: 08/23/2016  Check In:     Session Check In - 08/23/16 1136      Check-In   Location [facility]-Cardiac & Pulmonary Rehab   Staff Present Alberteen Sam, MA, ACSM RCEP, Exercise Physiologist;Amanda Oletta Darter, BA, ACSM CEP, Exercise Physiologist;Laureen Owens Shark, BS, RRT, Respiratory Therapist   Supervising physician immediately available to respond to emergencies [facility] immediately available ER MD   Physician(s) Drs. Malinda and Williams   Medication changes reported     No   Fall or balance concerns reported    No   Warm-up and Cool-down Performed as group-led Location manager Performed Yes   VAD Patient? No     Pain Assessment   Currently in Pain? No/denies   Multiple Pain Sites No         Goals Met:  Proper associated with RPD/PD & O2 Sat Independence with exercise equipment Using PLB without cueing & demonstrates good technique Exercise tolerated well No report of cardiac concerns or symptoms Strength training completed today  Goals Unmet:  Not Applicable  Comments: Pt able to follow exercise prescription today without complaint.  Will continue to monitor for progression.    Dr. Emily Filbert is Medical Director for [place] and [facility]monary Rehabilitation.

## 2016-08-30 DIAGNOSIS — N281 Cyst of kidney, acquired: Secondary | ICD-10-CM | POA: Diagnosis present

## 2016-08-30 DIAGNOSIS — I2510 Atherosclerotic heart disease of native coronary artery without angina pectoris: Secondary | ICD-10-CM | POA: Diagnosis present

## 2016-08-30 DIAGNOSIS — E785 Hyperlipidemia, unspecified: Secondary | ICD-10-CM | POA: Diagnosis present

## 2016-08-30 DIAGNOSIS — E871 Hypo-osmolality and hyponatremia: Secondary | ICD-10-CM | POA: Diagnosis not present

## 2016-08-30 DIAGNOSIS — Z7982 Long term (current) use of aspirin: Secondary | ICD-10-CM

## 2016-08-30 DIAGNOSIS — E876 Hypokalemia: Secondary | ICD-10-CM | POA: Diagnosis present

## 2016-08-30 DIAGNOSIS — F319 Bipolar disorder, unspecified: Secondary | ICD-10-CM | POA: Diagnosis present

## 2016-08-30 DIAGNOSIS — G4730 Sleep apnea, unspecified: Secondary | ICD-10-CM | POA: Diagnosis present

## 2016-08-30 DIAGNOSIS — R140 Abdominal distension (gaseous): Secondary | ICD-10-CM | POA: Diagnosis present

## 2016-08-30 DIAGNOSIS — R1032 Left lower quadrant pain: Secondary | ICD-10-CM | POA: Diagnosis present

## 2016-08-30 DIAGNOSIS — N179 Acute kidney failure, unspecified: Principal | ICD-10-CM | POA: Diagnosis present

## 2016-08-30 DIAGNOSIS — Z96641 Presence of right artificial hip joint: Secondary | ICD-10-CM | POA: Diagnosis present

## 2016-08-30 DIAGNOSIS — I739 Peripheral vascular disease, unspecified: Secondary | ICD-10-CM | POA: Diagnosis present

## 2016-08-30 DIAGNOSIS — Z8546 Personal history of malignant neoplasm of prostate: Secondary | ICD-10-CM

## 2016-08-30 DIAGNOSIS — Z23 Encounter for immunization: Secondary | ICD-10-CM

## 2016-08-30 DIAGNOSIS — E860 Dehydration: Secondary | ICD-10-CM | POA: Diagnosis present

## 2016-08-30 DIAGNOSIS — K2970 Gastritis, unspecified, without bleeding: Secondary | ICD-10-CM | POA: Diagnosis present

## 2016-08-30 DIAGNOSIS — N183 Chronic kidney disease, stage 3 (moderate): Secondary | ICD-10-CM | POA: Diagnosis present

## 2016-08-30 DIAGNOSIS — Z955 Presence of coronary angioplasty implant and graft: Secondary | ICD-10-CM

## 2016-08-30 DIAGNOSIS — J441 Chronic obstructive pulmonary disease with (acute) exacerbation: Secondary | ICD-10-CM | POA: Diagnosis not present

## 2016-08-30 DIAGNOSIS — K5900 Constipation, unspecified: Secondary | ICD-10-CM | POA: Diagnosis not present

## 2016-08-30 DIAGNOSIS — Z8249 Family history of ischemic heart disease and other diseases of the circulatory system: Secondary | ICD-10-CM

## 2016-08-30 DIAGNOSIS — Z6841 Body Mass Index (BMI) 40.0 and over, adult: Secondary | ICD-10-CM

## 2016-08-30 DIAGNOSIS — Z87891 Personal history of nicotine dependence: Secondary | ICD-10-CM

## 2016-08-30 DIAGNOSIS — Z79899 Other long term (current) drug therapy: Secondary | ICD-10-CM

## 2016-08-30 DIAGNOSIS — I129 Hypertensive chronic kidney disease with stage 1 through stage 4 chronic kidney disease, or unspecified chronic kidney disease: Secondary | ICD-10-CM | POA: Diagnosis present

## 2016-08-30 DIAGNOSIS — N3090 Cystitis, unspecified without hematuria: Secondary | ICD-10-CM | POA: Diagnosis present

## 2016-08-30 DIAGNOSIS — E669 Obesity, unspecified: Secondary | ICD-10-CM | POA: Diagnosis present

## 2016-08-30 LAB — COMPREHENSIVE METABOLIC PANEL
ALT: 17 U/L (ref 17–63)
AST: 33 U/L (ref 15–41)
Albumin: 2.9 g/dL — ABNORMAL LOW (ref 3.5–5.0)
Alkaline Phosphatase: 97 U/L (ref 38–126)
Anion gap: 8 (ref 5–15)
BUN: 20 mg/dL (ref 6–20)
CO2: 25 mmol/L (ref 22–32)
CREATININE: 1.99 mg/dL — AB (ref 0.61–1.24)
Calcium: 7.9 mg/dL — ABNORMAL LOW (ref 8.9–10.3)
Chloride: 87 mmol/L — ABNORMAL LOW (ref 101–111)
GFR calc Af Amer: 35 mL/min — ABNORMAL LOW (ref >60)
GFR, EST NON AFRICAN AMERICAN: 30 mL/min — AB (ref >60)
Glucose, Bld: 104 mg/dL — ABNORMAL HIGH (ref 65–99)
POTASSIUM: 3.9 mmol/L (ref 3.5–5.1)
Sodium: 120 mmol/L — ABNORMAL LOW (ref 135–145)
TOTAL PROTEIN: 6.4 g/dL — AB (ref 6.5–8.1)
Total Bilirubin: 0.5 mg/dL (ref 0.3–1.2)

## 2016-08-30 LAB — LIPASE, BLOOD: Lipase: 26 U/L (ref 11–51)

## 2016-08-30 LAB — URINALYSIS COMPLETE WITH MICROSCOPIC (ARMC ONLY)
Bacteria, UA: NONE SEEN
Bilirubin Urine: NEGATIVE
Glucose, UA: NEGATIVE mg/dL
KETONES UR: NEGATIVE mg/dL
NITRITE: POSITIVE — AB
PH: 7 (ref 5.0–8.0)
PROTEIN: 100 mg/dL — AB
SPECIFIC GRAVITY, URINE: 1.003 — AB (ref 1.005–1.030)

## 2016-08-30 LAB — TROPONIN I

## 2016-08-30 LAB — CBC
HCT: 31.8 % — ABNORMAL LOW (ref 40.0–52.0)
HEMOGLOBIN: 11.3 g/dL — AB (ref 13.0–18.0)
MCH: 29.9 pg (ref 26.0–34.0)
MCHC: 35.5 g/dL (ref 32.0–36.0)
MCV: 84.1 fL (ref 80.0–100.0)
PLATELETS: 237 10*3/uL (ref 150–440)
RBC: 3.78 MIL/uL — AB (ref 4.40–5.90)
RDW: 13.5 % (ref 11.5–14.5)
WBC: 7.8 10*3/uL (ref 3.8–10.6)

## 2016-08-30 NOTE — ED Triage Notes (Addendum)
Pt to triage via w/c with no distress noted; pt reports x week having nausea; denies pain, denies any accomp symptoms; pt unable to explain specificially what is bothering him, st "just an uneasy feeling in my stomach"

## 2016-08-31 ENCOUNTER — Inpatient Hospital Stay
Admission: EM | Admit: 2016-08-31 | Discharge: 2016-09-06 | DRG: 683 | Disposition: A | Discharge status: Home Health | Payer: Medicare Other | Attending: Internal Medicine | Admitting: Internal Medicine

## 2016-08-31 ENCOUNTER — Encounter: Payer: Self-pay | Admitting: Internal Medicine

## 2016-08-31 DIAGNOSIS — E876 Hypokalemia: Secondary | ICD-10-CM | POA: Diagnosis present

## 2016-08-31 DIAGNOSIS — N390 Urinary tract infection, site not specified: Secondary | ICD-10-CM | POA: Diagnosis present

## 2016-08-31 DIAGNOSIS — R140 Abdominal distension (gaseous): Secondary | ICD-10-CM | POA: Diagnosis present

## 2016-08-31 DIAGNOSIS — E785 Hyperlipidemia, unspecified: Secondary | ICD-10-CM | POA: Diagnosis present

## 2016-08-31 DIAGNOSIS — I2510 Atherosclerotic heart disease of native coronary artery without angina pectoris: Secondary | ICD-10-CM | POA: Diagnosis present

## 2016-08-31 DIAGNOSIS — J441 Chronic obstructive pulmonary disease with (acute) exacerbation: Secondary | ICD-10-CM | POA: Diagnosis not present

## 2016-08-31 DIAGNOSIS — N2889 Other specified disorders of kidney and ureter: Secondary | ICD-10-CM

## 2016-08-31 DIAGNOSIS — G4730 Sleep apnea, unspecified: Secondary | ICD-10-CM | POA: Diagnosis present

## 2016-08-31 DIAGNOSIS — Z79899 Other long term (current) drug therapy: Secondary | ICD-10-CM | POA: Diagnosis not present

## 2016-08-31 DIAGNOSIS — N3090 Cystitis, unspecified without hematuria: Secondary | ICD-10-CM | POA: Diagnosis present

## 2016-08-31 DIAGNOSIS — Z87891 Personal history of nicotine dependence: Secondary | ICD-10-CM | POA: Diagnosis not present

## 2016-08-31 DIAGNOSIS — R1032 Left lower quadrant pain: Secondary | ICD-10-CM | POA: Diagnosis present

## 2016-08-31 DIAGNOSIS — E871 Hypo-osmolality and hyponatremia: Secondary | ICD-10-CM | POA: Diagnosis present

## 2016-08-31 DIAGNOSIS — E669 Obesity, unspecified: Secondary | ICD-10-CM | POA: Diagnosis present

## 2016-08-31 DIAGNOSIS — Z6841 Body Mass Index (BMI) 40.0 and over, adult: Secondary | ICD-10-CM | POA: Diagnosis not present

## 2016-08-31 DIAGNOSIS — R531 Weakness: Secondary | ICD-10-CM

## 2016-08-31 DIAGNOSIS — I129 Hypertensive chronic kidney disease with stage 1 through stage 4 chronic kidney disease, or unspecified chronic kidney disease: Secondary | ICD-10-CM | POA: Diagnosis present

## 2016-08-31 DIAGNOSIS — N281 Cyst of kidney, acquired: Secondary | ICD-10-CM | POA: Diagnosis present

## 2016-08-31 DIAGNOSIS — R109 Unspecified abdominal pain: Secondary | ICD-10-CM

## 2016-08-31 DIAGNOSIS — K2970 Gastritis, unspecified, without bleeding: Secondary | ICD-10-CM | POA: Diagnosis present

## 2016-08-31 DIAGNOSIS — F319 Bipolar disorder, unspecified: Secondary | ICD-10-CM | POA: Diagnosis present

## 2016-08-31 DIAGNOSIS — N179 Acute kidney failure, unspecified: Secondary | ICD-10-CM | POA: Diagnosis present

## 2016-08-31 DIAGNOSIS — E860 Dehydration: Secondary | ICD-10-CM | POA: Diagnosis present

## 2016-08-31 DIAGNOSIS — K5900 Constipation, unspecified: Secondary | ICD-10-CM | POA: Diagnosis not present

## 2016-08-31 DIAGNOSIS — N183 Chronic kidney disease, stage 3 (moderate): Secondary | ICD-10-CM | POA: Diagnosis present

## 2016-08-31 DIAGNOSIS — R110 Nausea: Secondary | ICD-10-CM

## 2016-08-31 DIAGNOSIS — Z23 Encounter for immunization: Secondary | ICD-10-CM | POA: Diagnosis not present

## 2016-08-31 DIAGNOSIS — I739 Peripheral vascular disease, unspecified: Secondary | ICD-10-CM | POA: Diagnosis present

## 2016-08-31 DIAGNOSIS — Z7982 Long term (current) use of aspirin: Secondary | ICD-10-CM | POA: Diagnosis not present

## 2016-08-31 LAB — BASIC METABOLIC PANEL
ANION GAP: 7 (ref 5–15)
BUN: 19 mg/dL (ref 6–20)
CALCIUM: 7.4 mg/dL — AB (ref 8.9–10.3)
CO2: 24 mmol/L (ref 22–32)
CREATININE: 1.95 mg/dL — AB (ref 0.61–1.24)
Chloride: 92 mmol/L — ABNORMAL LOW (ref 101–111)
GFR calc Af Amer: 36 mL/min — ABNORMAL LOW (ref >60)
GFR, EST NON AFRICAN AMERICAN: 31 mL/min — AB (ref >60)
GLUCOSE: 101 mg/dL — AB (ref 65–99)
Potassium: 3.3 mmol/L — ABNORMAL LOW (ref 3.5–5.1)
Sodium: 123 mmol/L — ABNORMAL LOW (ref 135–145)

## 2016-08-31 LAB — CBC
HCT: 28.6 % — ABNORMAL LOW (ref 40.0–52.0)
Hemoglobin: 10.3 g/dL — ABNORMAL LOW (ref 13.0–18.0)
MCH: 30.7 pg (ref 26.0–34.0)
MCHC: 36.1 g/dL — ABNORMAL HIGH (ref 32.0–36.0)
MCV: 85.3 fL (ref 80.0–100.0)
PLATELETS: 203 10*3/uL (ref 150–440)
RBC: 3.35 MIL/uL — ABNORMAL LOW (ref 4.40–5.90)
RDW: 13.6 % (ref 11.5–14.5)
WBC: 7 10*3/uL (ref 3.8–10.6)

## 2016-08-31 LAB — ETHANOL: Alcohol, Ethyl (B): <5 mg/dL (ref <5)

## 2016-08-31 LAB — MAGNESIUM: Magnesium: 1.9 mg/dL (ref 1.7–2.4)

## 2016-08-31 MED ORDER — ACETAMINOPHEN 650 MG RE SUPP: 650.0000 mg | Freq: Four times a day (QID) | RECTAL | Status: DC | PRN

## 2016-08-31 MED ORDER — SENNOSIDES-DOCUSATE SODIUM 8.6-50 MG PO TABS: 1.0000 | ORAL_TABLET | Freq: Every evening | ORAL | Status: DC | PRN

## 2016-08-31 MED ORDER — ASPIRIN EC 81 MG PO TBEC
81.0000 mg | DELAYED_RELEASE_TABLET | Freq: Every day | ORAL | Status: DC
  Administered 2016-08-31 – 2016-09-06 (×7): 81 mg via ORAL
  Filled 2016-08-31 (×7): qty 1

## 2016-08-31 MED ORDER — CEFTRIAXONE SODIUM 1 G IJ SOLR
1.0000 g | INTRAMUSCULAR | Status: DC
  Administered 2016-08-31 – 2016-09-04 (×5): 1 g via INTRAVENOUS
  Filled 2016-08-31 (×5): qty 10

## 2016-08-31 MED ORDER — INFLUENZA VAC SPLIT QUAD 0.5 ML IM SUSY
0.5000 mL | PREFILLED_SYRINGE | INTRAMUSCULAR | Status: AC
  Administered 2016-09-01: 0.5 mL via INTRAMUSCULAR
  Filled 2016-08-31: qty 0.5

## 2016-08-31 MED ORDER — ALBUTEROL SULFATE (2.5 MG/3ML) 0.083% IN NEBU: 3.0000 mL | INHALATION_SOLUTION | Freq: Four times a day (QID) | RESPIRATORY_TRACT | Status: DC | PRN

## 2016-08-31 MED ORDER — LEVOTHYROXINE SODIUM 50 MCG PO TABS
50.0000 ug | ORAL_TABLET | Freq: Every day | ORAL | Status: DC
  Administered 2016-08-31 – 2016-09-06 (×7): 50 ug via ORAL
  Filled 2016-08-31 (×7): qty 1

## 2016-08-31 MED ORDER — SODIUM CHLORIDE 0.9 % IV BOLUS (SEPSIS)
1000.0000 mL | Freq: Once | INTRAVENOUS | Status: AC
  Administered 2016-08-31: 1000 mL via INTRAVENOUS

## 2016-08-31 MED ORDER — TIOTROPIUM BROMIDE MONOHYDRATE 18 MCG IN CAPS
18.0000 ug | ORAL_CAPSULE | Freq: Every day | RESPIRATORY_TRACT | Status: DC
  Administered 2016-08-31 – 2016-09-06 (×7): 18 ug via RESPIRATORY_TRACT
  Filled 2016-08-31 (×2): qty 5

## 2016-08-31 MED ORDER — MOMETASONE FURO-FORMOTEROL FUM 200-5 MCG/ACT IN AERO
2.0000 | INHALATION_SPRAY | Freq: Two times a day (BID) | RESPIRATORY_TRACT | Status: DC
  Administered 2016-08-31 – 2016-09-05 (×4): 2 via RESPIRATORY_TRACT
  Filled 2016-08-31: qty 8.8

## 2016-08-31 MED ORDER — ACETAMINOPHEN 325 MG PO TABS
650.0000 mg | ORAL_TABLET | Freq: Four times a day (QID) | ORAL | Status: DC | PRN
  Filled 2016-08-31: qty 2

## 2016-08-31 MED ORDER — HYDROCODONE-ACETAMINOPHEN 5-325 MG PO TABS
1.0000 | ORAL_TABLET | ORAL | Status: DC | PRN
  Administered 2016-09-02 (×3): 1 via ORAL
  Administered 2016-09-03 (×2): 2 via ORAL
  Administered 2016-09-04: 1 via ORAL
  Administered 2016-09-04: 2 via ORAL
  Filled 2016-08-31 (×5): qty 1
  Filled 2016-08-31: qty 2
  Filled 2016-08-31: qty 1
  Filled 2016-08-31: qty 2

## 2016-08-31 MED ORDER — ENOXAPARIN SODIUM 40 MG/0.4ML ~~LOC~~ SOLN
40.0000 mg | Freq: Two times a day (BID) | SUBCUTANEOUS | Status: DC
  Administered 2016-08-31 – 2016-09-06 (×13): 40 mg via SUBCUTANEOUS
  Filled 2016-08-31 (×14): qty 0.4

## 2016-08-31 MED ORDER — SODIUM CHLORIDE 0.9 % IV SOLN
INTRAVENOUS | Status: DC
  Administered 2016-08-31 – 2016-09-02 (×5): via INTRAVENOUS

## 2016-08-31 MED ORDER — HYDRALAZINE HCL 20 MG/ML IJ SOLN: 10.0000 mg | Freq: Four times a day (QID) | INTRAMUSCULAR | Status: DC | PRN

## 2016-08-31 MED ORDER — ONDANSETRON HCL 4 MG/2ML IJ SOLN: 4.0000 mg | Freq: Four times a day (QID) | INTRAMUSCULAR | Status: DC | PRN

## 2016-08-31 MED ORDER — HYDRALAZINE HCL 25 MG PO TABS
25.0000 mg | ORAL_TABLET | Freq: Three times a day (TID) | ORAL | Status: DC
  Administered 2016-08-31 – 2016-09-06 (×19): 25 mg via ORAL
  Filled 2016-08-31 (×20): qty 1

## 2016-08-31 MED ORDER — METOPROLOL TARTRATE 25 MG PO TABS
25.0000 mg | ORAL_TABLET | Freq: Two times a day (BID) | ORAL | Status: DC
  Administered 2016-08-31 – 2016-09-05 (×12): 25 mg via ORAL
  Filled 2016-08-31 (×13): qty 1

## 2016-08-31 MED ORDER — ATORVASTATIN CALCIUM 20 MG PO TABS
40.0000 mg | ORAL_TABLET | Freq: Every day | ORAL | Status: DC
Start: 2016-08-31 — End: 2016-09-06
  Administered 2016-08-31 – 2016-09-05 (×6): 40 mg via ORAL
  Filled 2016-08-31 (×6): qty 2

## 2016-08-31 MED ORDER — ONDANSETRON HCL 4 MG PO TABS
4.0000 mg | ORAL_TABLET | Freq: Four times a day (QID) | ORAL | Status: DC | PRN
  Administered 2016-08-31 – 2016-09-03 (×5): 4 mg via ORAL
  Filled 2016-08-31 (×5): qty 1

## 2016-08-31 MED ORDER — ONDANSETRON HCL 4 MG/2ML IJ SOLN
4.0000 mg | Freq: Once | INTRAMUSCULAR | Status: AC
  Administered 2016-08-31: 4 mg via INTRAVENOUS
  Filled 2016-08-31: qty 2

## 2016-08-31 MED ORDER — MAGNESIUM OXIDE 400 (241.3 MG) MG PO TABS
400.0000 mg | ORAL_TABLET | Freq: Every day | ORAL | Status: DC
  Administered 2016-08-31 – 2016-09-06 (×8): 400 mg via ORAL
  Filled 2016-08-31 (×8): qty 1

## 2016-08-31 MED ORDER — NITROGLYCERIN 0.4 MG SL SUBL: 0.4000 mg | SUBLINGUAL_TABLET | SUBLINGUAL | Status: DC | PRN

## 2016-08-31 NOTE — Progress Notes (Signed)
Pharmacy Antibiotic Note  JACODY BENEKE is a 81 y.o. male admitted on 08/31/2016 with UTI.  Pharmacy has been consulted for ceftriaxone dosing.  Plan: Continue Ceftriaxone 1 gm IV Q24H. Follow-up on cultures.   Height: 5\' 7"  (170.2 cm) Weight: 300 lb (136.1 kg) IBW/kg (Calculated) : 66.1  Temp (24hrs), Avg:97.8 F (36.6 C), Min:97.6 F (36.4 C), Max:98.1 F (36.7 C)   Recent Labs Lab 08/30/16 2236 08/31/16 0420  WBC 7.8 7.0  CREATININE 1.99* 1.95*    Estimated Creatinine Clearance: 40.9 mL/min (by C-G formula based on SCr of 1.95 mg/dL (H)).    Allergies  Allergen Reactions  . Amlodipine     Constipation   . Benazepril-Hydrochlorothiazide     Constipation   . Chlorthalidone     Hyponatremia  . Other Other (See Comments)    "ANY BLOOD PRESSURE MEDICATIONS THAT I'VE TRIED" - PT. DOES NOT REMEMBER WHICH ONES   Antibiotics: Ceftriaxone 9/13 >>   Cultures: 9/13: urine cx: in process 9/14: blood cx: NGTD   Thank you for allowing pharmacy to be a part of this patient's care.  Loree Fee, PharmD Clinical Pharmacist 08/31/2016 9:24 AM

## 2016-08-31 NOTE — Progress Notes (Addendum)
[place] [facility] [place] NAME: Nicholas [name]    MR#:  [id number]  DATE OF BIRTH:  July 04, 1935  SUBJECTIVE:  CHIEF COMPLAINT:   Chief Complaint  Patient presents with  . Abdominal Pain   Abdominal pain, nausea andWeakness. REVIEW OF SYSTEMS:  Review of Systems  Constitutional: Positive for malaise/fatigue. Negative for chills and fever.  Eyes: Negative for blurred vision and double vision.  Respiratory: Negative for cough, shortness of breath, wheezing and stridor.   Cardiovascular: Negative for chest pain, palpitations and leg swelling.  Gastrointestinal: Positive for abdominal pain and nausea. Negative for blood in stool, constipation, diarrhea, melena and vomiting.  Genitourinary: Negative for dysuria, frequency and urgency.  Musculoskeletal: Negative for joint pain.  Skin: Negative for rash.  Neurological: Positive for weakness. Negative for dizziness, focal weakness, seizures, loss of consciousness and headaches.  Psychiatric/Behavioral: Negative for depression.    DRUG ALLERGIES:   Allergies  Allergen Reactions  . Amlodipine     Constipation   . Benazepril-Hydrochlorothiazide     Constipation   . Chlorthalidone     Hyponatremia  . Other Other (See Comments)    "ANY BLOOD PRESSURE MEDICATIONS THAT I'VE TRIED" - PT. DOES NOT REMEMBER WHICH ONES   VITALS:  Blood pressure (!) 159/70, pulse 78, temperature 98.4 F (36.9 C), temperature source Oral, resp. rate 18, height 5\' 7"  (1.702 m), weight 300 lb (136.1 kg), SpO2 97 %. PHYSICAL EXAMINATION:  Physical Exam  Constitutional: He is oriented to person, place, and time and well-developed, well-nourished, and in no distress. No distress.  HENT:  Head: Normocephalic.  Mouth/Throat: Oropharynx is clear and moist.  Eyes: Conjunctivae and EOM are normal. No scleral icterus.  Neck: Normal range of motion. Neck supple. No JVD present. No thyromegaly present.  Cardiovascular: Normal  rate, regular rhythm and normal heart sounds.  Exam reveals no gallop.   No murmur heard. Pulmonary/Chest: Effort normal and breath sounds normal. No respiratory distress. He has no wheezes. He has no rales.  Abdominal: Soft. Bowel sounds are normal. He exhibits no distension. There is no tenderness.  Musculoskeletal: Normal range of motion. He exhibits no edema or tenderness.  Neurological: He is alert and oriented to person, place, and time. No cranial nerve deficit.  Skin: Skin is dry.  Psychiatric: Affect normal.   LABORATORY PANEL:   CBC  Recent Labs Lab 08/31/16 0420  WBC 7.0  HGB 10.3*  HCT 28.6*  PLT 203   ------------------------------------------------------------------------------------------------------------------ Chemistries   Recent Labs Lab 08/30/16 2236 08/31/16 0420  NA 120* 123*  K 3.9 3.3*  CL 87* 92*  CO2 25 24  GLUCOSE 104* 101*  BUN 20 19  CREATININE 1.99* 1.95*  CALCIUM 7.9* 7.4*  MG  --  1.9  AST 33  --   ALT 17  --   ALKPHOS 97  --   BILITOT 0.5  --    RADIOLOGY:  No results found. ASSESSMENT AND PLAN:   81 year old [name] patient with history of peripheral vascular disease, hypertension, COPD, prostate cancer, hyperlipidemia, coronary artery disease presented to the emergency room with weakness, nausea.   1. Hyponatremia. Continue NS iv, f/u BMP. 2. Urinary tract infection. IV Rocephin, f/u U/C. 3. ARF with Dehydration, hold losartan. continue IVF. F/u BMP. 4. Hypertension, hold losartan. Start hydralazine po tid and iv prn. 5. COPD. Stable. Neb prn.  Hypokalemia. Given KCl.  Weakness. PT. All the records are reviewed and case discussed with Care Management/Social Worker.  Management plans discussed with the patient, family and they are in agreement.  CODE STATUS: full code.  TOTAL TIME TAKING CARE OF THIS PATIENT: 37 minutes.   More than 50% of the time was spent in counseling/coordination of care: YES  POSSIBLE D/C IN 3  DAYS, DEPENDING ON CLINICAL CONDITION.   Demetrios Loll M.D on 08/31/2016 at 3:37 PM  Between 7am to 6pm - Pager - [phone]  After 6pm go to [email or web address] - Proofreader  [facility]  Office  [phone]  CC: Primary care physician; Marguerita Merles, MD  Note: This dictation was prepared with Dragon dictation along with smaller phrase technology. Any transcriptional errors that result from this process are unintentional.

## 2016-08-31 NOTE — Care Management (Signed)
Admitted to this facility with the diagnosis of hyponatremia/urinary tract infection. Lives alone at [facility] for 2-3 years. Daughter is [place] [phone]). Last seen Dr. Delight Stare 2-3 months ago. Last was at Pulmonary Rehab at this facility about 2 weeks ago. Cane available, if needed. CPAP for about a year. Takes care of all basic and instrumental activities of daily living himself, doesn't drive anymore. Uses public transportation to get places. No home health. Skilled facility in the past following a broken hip, doesn't remember name of facility. No home oxygen. Prescriptions are filled at [facility], they deliver. Slipped to the ground a couple of weeks ago. Good appetite. Worked at [employer] prior to retiring. Daughter works full time job now, not sure how he will be transported home. Shelbie Ammons RN MSN CCM Care Management [phone]

## 2016-08-31 NOTE — ED Provider Notes (Signed)
[facility] Emergency Department Provider Note   ____________________________________________   First MD Initiated Contact with Patient 08/31/16 [phone]     (approximate)  I have reviewed the triage vital signs and the nursing notes.   HISTORY  Chief Complaint Abdominal Pain    HPI [name] is a 81 y.o. male who presents to the ED from home with a chief complaint of nausea and generalized malaise. Reports symptoms ongoing for the past week associated with decreased appetite. Patient unable to explain specifically but just states "uneasy feeling in my stomach". Denies fever, chills, chest pain, shortness of breath, abdominal pain, vomiting, dysuria, diarrhea. Some travel trauma. Nothing makes his symptoms better or worse.   Past Medical History:  Diagnosis Date  . Alcoholism ([place])   . Asthma   . Bipolar affective disorder ([place])   . COPD (chronic obstructive pulmonary disease) (HCC)    COPD  . Coronary artery disease February 2005   PCI and Taxus drug-eluting stent placement to the distal RCA (3.5 x 12 mm)  . Degenerative joint disease   . Essential hypertension   . Hyperlipidemia   . Hypertension   . Prostate CA [facility])    prostate ca dx 107 ys ago;  . Prostate CA [facility])    prostate ca dx 20 yrs ago  . PVD (peripheral vascular disease) ([place])   . Tobacco abuse     Patient Active Problem List   Diagnosis Date Noted  . Exertional dyspnea 02/11/2016  . Hyperlipidemia 12/03/2015  . Essential hypertension   . Coronary artery disease 01/19/2004    Past Surgical History:  Procedure Laterality Date  . CARDIAC CATHETERIZATION    . CORONARY ANGIOPLASTY WITH STENT PLACEMENT    . TOTAL HIP ARTHROPLASTY     right    Prior to Admission medications   Medication Sig Start Date End Date Taking? Authorizing Provider  albuterol (PROAIR HFA) 108 (90 Base) MCG/ACT inhaler Inhale 2 puffs into the lungs every 6 (six) hours as needed. Reported on 04/21/2016  01/21/16 01/20/17  Historical Provider, MD  aspirin 81 MG tablet Take 81 mg by mouth daily.  11/18/12   Historical Provider, MD  atorvastatin (LIPITOR) 40 MG tablet Take 1 tablet (40 mg total) by mouth daily. 03/06/16   [name], MD  BREO ELLIPTA 100-25 MCG/INH AEPB Inhale 1 puff into the lungs daily. Reported on 04/21/2016 10/29/15   Historical Provider, MD  docusate sodium (COLACE) 50 MG capsule Take 50 mg by mouth 2 (two) times daily.    Historical Provider, MD  fluticasone-salmeterol (ADVAIR HFA) 115-21 MCG/ACT inhaler Inhale 2 puffs into the lungs 2 (two) times daily.    Historical Provider, MD  furosemide (LASIX) 20 MG tablet Take 20 mg by mouth.    Historical Provider, MD  HYDROcodone-acetaminophen (NORCO/VICODIN) 5-325 MG tablet Take 1-2 tablets by mouth every 6 (six) hours as needed for moderate pain. Patient not taking: Reported on 04/21/2016 01/29/16   Johnn Hai, PA-C  levothyroxine (SYNTHROID, LEVOTHROID) 50 MCG tablet Take 50 mcg by mouth daily. 01/21/16   Historical Provider, MD  losartan (COZAAR) 100 MG tablet Take 1 tablet (100 mg total) by mouth daily. 04/20/16   [name], MD  nitroGLYCERIN (NITROSTAT) 0.4 MG SL tablet Take 0.4 mg by mouth daily as needed. As needed for chest pain 12/06/15   Historical Provider, MD  ondansetron (ZOFRAN ODT) 4 MG disintegrating tablet Take 1 tablet (4 mg total) by mouth every 6 (six)  hours as needed for nausea or vomiting. 01/01/16   Delman Kitten, MD    Allergies Amlodipine; Benazepril-hydrochlorothiazide; Chlorthalidone; and Other  Family History  Problem Relation Age of Onset  . Hypertension Mother   . Hyperlipidemia Mother   . Heart attack Mother   . Hypertension Father   . Hyperlipidemia Father   . Heart attack Father     Social History Social History  Substance Use Topics  . Smoking status: Former Smoker    Packs/day: 1.00    Years: 50.00    Types: Cigarettes  . Smokeless tobacco: Not on file  . Alcohol use Yes      Comment: social     Review of Systems  Constitutional: Positive for generalized malaise. No fever/chills. Eyes: No visual changes. ENT: No sore throat. Cardiovascular: Denies chest pain. Respiratory: Denies shortness of breath. Gastrointestinal: No abdominal pain.  Positive for nausea, no vomiting.  No diarrhea.  No constipation. Genitourinary: Negative for dysuria. Musculoskeletal: Negative for back pain. Skin: Negative for rash. Neurological: Negative for headaches, focal weakness or numbness.  10-point ROS otherwise negative.  ____________________________________________   PHYSICAL EXAM:  VITAL SIGNS: ED Triage Vitals  Enc Vitals Group     BP 08/30/16 2233 (!) 157/81     Pulse Rate 08/30/16 2233 81     Resp 08/31/16 0039 20     Temp 08/30/16 2233 97.6 F (36.4 C)     Temp Source 08/30/16 2233 Oral     SpO2 08/30/16 2233 97 %     Weight 08/30/16 2233 299 lb (135.6 kg)     Height 08/30/16 2233 5\' 7"  (1.702 m)     Head Circumference --      Peak Flow --      Pain Score --      Pain Loc --      Pain Edu? --      Excl. in [place]? --     Constitutional: Alert and oriented. Well appearing and in no acute distress. Eyes: Conjunctivae are normal. PERRL. EOMI. Head: Atraumatic. Nose: No congestion/rhinnorhea. Mouth/Throat: Mucous membranes are moist.  Oropharynx non-erythematous. Neck: No stridor.   Cardiovascular: Normal rate, regular rhythm. Grossly normal heart sounds.  Good peripheral circulation. Respiratory: Normal respiratory effort.  No retractions. Lungs CTAB. Gastrointestinal: Obese. Soft and nontender. No distention. No abdominal bruits. No CVA tenderness. Musculoskeletal: No lower extremity tenderness nor edema.  No joint effusions. Neurologic:  Normal speech and language. No gross focal neurologic deficits are appreciated. No gait instability. Skin:  Skin is warm, dry and intact. No rash noted. Psychiatric: Mood and affect are normal. Speech and behavior are  normal.  ____________________________________________   LABS (all labs ordered are listed, but only abnormal results are displayed)  Labs Reviewed  COMPREHENSIVE METABOLIC PANEL - Abnormal; Notable for the following:       Result Value   Sodium 120 (*)    Chloride 87 (*)    Glucose, Bld 104 (*)    Creatinine, Ser 1.99 (*)    Calcium 7.9 (*)    Total Protein 6.4 (*)    Albumin 2.9 (*)    GFR calc non Af Amer 30 (*)    GFR calc Af Amer 35 (*)    All other components within normal limits  CBC - Abnormal; Notable for the following:    RBC 3.78 (*)    Hemoglobin 11.3 (*)    HCT 31.8 (*)    All other components within normal limits  URINALYSIS COMPLETEWITH  MICROSCOPIC ([facility] ONLY) - Abnormal; Notable for the following:    Color, Urine AMBER (*)    APPearance TURBID (*)    Specific Gravity, Urine 1.003 (*)    Hgb urine dipstick 2+ (*)    Protein, ur 100 (*)    Nitrite POSITIVE (*)    Leukocytes, UA 3+ (*)    Squamous Epithelial / LPF 0-5 (*)    All other components within normal limits  CULTURE, BLOOD (ROUTINE X 2)  CULTURE, BLOOD (ROUTINE X 2)  URINE CULTURE  LIPASE, BLOOD  TROPONIN I   ____________________________________________  EKG  ED ECG REPORT I, Antoria Lanza J, the attending physician, personally viewed and interpreted this ECG.   Date: 08/31/2016  EKG Time: 2238  Rate: 75  Rhythm: normal EKG, normal sinus rhythm  Axis: Normal  Intervals:none  ST&T Change: Nonspecific  ____________________________________________  RADIOLOGY  None ____________________________________________   PROCEDURES  Procedure(s) performed: None  Procedures  Critical Care performed: No  ____________________________________________   INITIAL IMPRESSION / ASSESSMENT AND PLAN / ED COURSE  Pertinent labs & imaging results that were available during my care of the patient were reviewed by me and considered in my medical decision making (see chart for details).  81 year old  male who presents with generalized malaise and nausea. Laboratory and urinalysis results remarkable for hyponatremia, renal insufficiency and UTI. Will initiate IV fluid resuscitation, IV antibiotic and discuss with hospitalist to evaluate patient in the emergency department for admission.  Clinical Course  Value Comment By Time  Sodium: (!) 120 (Reviewed) Paulette Blanch, MD [phone]/14 [phone]     ____________________________________________   FINAL CLINICAL IMPRESSION(S) / ED DIAGNOSES  Final diagnoses:  UTI (lower urinary tract infection)  Nausea  Weakness  Hyponatremia      NEW MEDICATIONS STARTED DURING THIS VISIT:  New Prescriptions   No medications on file     Note:  This document was prepared using Dragon voice recognition software and may include unintentional dictation errors.    Paulette Blanch, MD 08/31/16 [phone]

## 2016-08-31 NOTE — H&P (Signed)
[place] [facility] [place] NAME: [name]    MR#:  [id number]  DATE OF BIRTH:  08-10-1935  DATE OF ADMISSION:  08/31/2016  PRIMARY CARE PHYSICIAN: Marguerita Merles, MD   REQUESTING/REFERRING PHYSICIAN:   CHIEF COMPLAINT:   Chief Complaint  Patient presents with  . Abdominal Pain    HISTORY OF PRESENT ILLNESS: [name]  is a 81 y.o. male with a known history of Bronchial asthma, bipolar disorder, COPD, coronary artery disease, hypertension, hyperlipidemia, prostate cancer, peripheral vascular disease presented to the emergency room with uneasiness on the stomach and nausea. Patient also had one episode of vomiting. Vomitus contained food and water. No history of any recent travel or sick contacts at home. Patient had mild abdominal discomfort which was aching in nature 2 out of 10 on a scale of 1-10. No history of any chest pain. No complaints of any shortness of breath. Patient lives alone and takes care of himself. He is independent in activities of daily living. Workup was done in the emergency room his sodium level was low around 120 and he is on losartan for hypertension. Patient also complains of burning sensation when he passes urine. No complaints of any fever. His urinalysis showed infection and he was given IV Rocephin antibiotic in the emergency room. Hospitalist service was consulted for further care of the patient.  PAST MEDICAL HISTORY:   Past Medical History:  Diagnosis Date  . Alcoholism ([place])   . Asthma   . Bipolar affective disorder ([place])   . COPD (chronic obstructive pulmonary disease) (HCC)    COPD  . Coronary artery disease February 2005   PCI and Taxus drug-eluting stent placement to the distal RCA (3.5 x 12 mm)  . Degenerative joint disease   . Essential hypertension   . Hyperlipidemia   . Hypertension   . Prostate CA [facility])    prostate ca dx 18 ys ago;  . Prostate CA [facility])    prostate ca dx 20 yrs ago  . PVD  (peripheral vascular disease) ([place])   . Tobacco abuse     PAST SURGICAL HISTORY: Past Surgical History:  Procedure Laterality Date  . CARDIAC CATHETERIZATION    . CORONARY ANGIOPLASTY WITH STENT PLACEMENT    . TOTAL HIP ARTHROPLASTY     right    SOCIAL HISTORY:  Social History  Substance Use Topics  . Smoking status: Former Smoker    Packs/day: 1.00    Years: 50.00    Types: Cigarettes  . Smokeless tobacco: Never Used  . Alcohol use Yes     Comment: social     FAMILY HISTORY:  Family History  Problem Relation Age of Onset  . Hypertension Mother   . Hyperlipidemia Mother   . Heart attack Mother   . Hypertension Father   . Hyperlipidemia Father   . Heart attack Father     DRUG ALLERGIES:  Allergies  Allergen Reactions  . Amlodipine     Constipation   . Benazepril-Hydrochlorothiazide     Constipation   . Chlorthalidone     Hyponatremia  . Other Other (See Comments)    "ANY BLOOD PRESSURE MEDICATIONS THAT I'VE TRIED" - PT. DOES NOT REMEMBER WHICH ONES    REVIEW OF SYSTEMS:   CONSTITUTIONAL: No fever, has weakness.  EYES: No blurred or double vision.  EARS, NOSE, AND THROAT: No tinnitus or ear pain.  RESPIRATORY: No cough, shortness of breath, wheezing or hemoptysis.  CARDIOVASCULAR: No  chest pain, orthopnea, edema.  GASTROINTESTINAL: Has nausea, vomiting, abdominal discomfort No diarrhea   GENITOURINARY: Has dysuria,  No hematuria.  ENDOCRINE: No polyuria, nocturia,  HEMATOLOGY: No anemia, easy bruising or bleeding SKIN: No rash or lesion. MUSCULOSKELETAL: No joint pain or arthritis.   NEUROLOGIC: No tingling, numbness, weakness.  PSYCHIATRY: No anxiety or depression.   MEDICATIONS AT HOME:  Prior to Admission medications   Medication Sig Start Date End Date Taking? Authorizing Provider  albuterol (PROAIR HFA) 108 (90 Base) MCG/ACT inhaler Inhale 2 puffs into the lungs every 6 (six) hours as needed. Reported on 04/21/2016 01/21/16 01/20/17 Yes Historical  Provider, MD  aspirin 81 MG tablet Take 81 mg by mouth daily.  11/18/12  Yes Historical Provider, MD  docusate sodium (COLACE) 50 MG capsule Take 50 mg by mouth 2 (two) times daily.   Yes Historical Provider, MD  fluticasone-salmeterol (ADVAIR HFA) 115-21 MCG/ACT inhaler Inhale 2 puffs into the lungs 2 (two) times daily.   Yes Historical Provider, MD  levothyroxine (SYNTHROID, LEVOTHROID) 50 MCG tablet Take 50 mcg by mouth daily. 01/21/16  Yes Historical Provider, MD  losartan (COZAAR) 100 MG tablet Take 1 tablet (100 mg total) by mouth daily. 04/20/16  Yes [name], MD  magnesium oxide (MAG-OX) 400 MG tablet Take 400 mg by mouth daily.   Yes Historical Provider, MD  nitroGLYCERIN (NITROSTAT) 0.4 MG SL tablet Take 0.4 mg by mouth daily as needed. As needed for chest pain 12/06/15  Yes Historical Provider, MD  phentermine 30 MG capsule Take 30 mg by mouth every morning.   Yes Historical Provider, MD  tiotropium (SPIRIVA) 18 MCG inhalation capsule Place 18 mcg into inhaler and inhale daily.   Yes Historical Provider, MD  atorvastatin (LIPITOR) 40 MG tablet Take 1 tablet (40 mg total) by mouth daily. Patient not taking: Reported on 08/31/2016 03/06/16   [name], MD  HYDROcodone-acetaminophen (NORCO/VICODIN) 5-325 MG tablet Take 1-2 tablets by mouth every 6 (six) hours as needed for moderate pain. Patient not taking: Reported on 08/31/2016 01/29/16   Johnn Hai, PA-C  ondansetron (ZOFRAN ODT) 4 MG disintegrating tablet Take 1 tablet (4 mg total) by mouth every 6 (six) hours as needed for nausea or vomiting. Patient not taking: Reported on 08/31/2016 01/01/16   Delman Kitten, MD      PHYSICAL EXAMINATION:   VITAL SIGNS: Blood pressure (!) 173/71, pulse 73, temperature 97.6 F (36.4 C), temperature source Oral, resp. rate 20, height 5\' 7"  (1.702 m), weight 135.6 kg (299 lb), SpO2 99 %.  GENERAL:  81 y.o.-year-old obese male patient lying in the bed with no acute distress.  EYES: Pupils  equal, round, reactive to light and accommodation. No scleral icterus. Extraocular muscles intact.  HEENT: Head atraumatic, normocephalic. Oropharynx dry and nasopharynx clear.  NECK:  Supple, no jugular venous distention. No thyroid enlargement, no tenderness.  LUNGS: Normal breath sounds bilaterally, no wheezing, rales,rhonchi or crepitation. No use of accessory muscles of respiration.  CARDIOVASCULAR: S1, S2 normal. No murmurs, rubs, or gallops.  ABDOMEN: Soft,obese, nontender, nondistended. Bowel sounds present. No organomegaly or mass.  EXTREMITIES: No pedal edema, cyanosis, or clubbing.  NEUROLOGIC: Cranial nerves II through XII are intact. Muscle strength 5/5 in all extremities. Sensation intact. Gait not checked.  PSYCHIATRIC: The patient is alert and oriented x 3.  SKIN: No obvious rash, lesion, or ulcer.   LABORATORY PANEL:   CBC  Recent Labs Lab 08/30/16 2236  WBC 7.8  HGB 11.3*  HCT 31.8*  PLT 237  MCV 84.1  MCH 29.9  MCHC 35.5  RDW 13.5   ------------------------------------------------------------------------------------------------------------------  Chemistries   Recent Labs Lab 08/30/16 2236  NA 120*  K 3.9  CL 87*  CO2 25  GLUCOSE 104*  BUN 20  CREATININE 1.99*  CALCIUM 7.9*  AST 33  ALT 17  ALKPHOS 97  BILITOT 0.5   ------------------------------------------------------------------------------------------------------------------ estimated creatinine clearance is 40 mL/min (by C-G formula based on SCr of 1.99 mg/dL (H)). ------------------------------------------------------------------------------------------------------------------ No results for input(s): TSH, T4TOTAL, T3FREE, THYROIDAB in the last 72 hours.  Invalid input(s): FREET3   Coagulation profile No results for input(s): INR, PROTIME in the last 168 hours. ------------------------------------------------------------------------------------------------------------------- No  results for input(s): DDIMER in the last 72 hours. -------------------------------------------------------------------------------------------------------------------  Cardiac Enzymes  Recent Labs Lab 08/30/16 2236  TROPONINI <0.03   ------------------------------------------------------------------------------------------------------------------ Invalid input(s): POCBNP  ---------------------------------------------------------------------------------------------------------------  Urinalysis    Component Value Date/Time   COLORURINE AMBER (A) 08/30/2016 2249   APPEARANCEUR TURBID (A) 08/30/2016 2249   APPEARANCEUR Clear 04/03/2015 1216   LABSPEC 1.003 (L) 08/30/2016 2249   LABSPEC 1.004 04/03/2015 1216   PHURINE 7.0 08/30/2016 2249   GLUCOSEU NEGATIVE 08/30/2016 2249   GLUCOSEU Negative 04/03/2015 1216   HGBUR 2+ (A) 08/30/2016 2249   BILIRUBINUR NEGATIVE 08/30/2016 2249   BILIRUBINUR Negative 04/03/2015 1216   KETONESUR NEGATIVE 08/30/2016 2249   PROTEINUR 100 (A) 08/30/2016 2249   UROBILINOGEN 0.2 03/27/2011 0845   NITRITE POSITIVE (A) 08/30/2016 2249   LEUKOCYTESUR 3+ (A) 08/30/2016 2249   LEUKOCYTESUR Negative 04/03/2015 1216     RADIOLOGY: No results found.  EKG: Orders placed or performed during the hospital encounter of 08/31/16  . ED EKG  . ED EKG    IMPRESSION AND PLAN: 81 year old male patient with history of peripheral vascular disease, hypertension, COPD, prostate cancer, hyperlipidemia, coronary artery disease presented to the emergency room with weakness, nausea.  Admitting diagnosis 1. Hyponatremia 2. Urinary tract infection 3. Dehydration 4. Nausea and vomiting 5. Hypertension  Treatment plan Admit patient to medical floor inpatient service IV fluid hydration Follow up sodium closely Hold losartan Antiemetics DVT prophylaxis subcutaneous Lovenox 40 MG daily IV Rocephin antibiotic 1 g daily Follow-up electrolytes Supportive  care.  All the records are reviewed and case discussed with ED provider. Management plans discussed with the patient, family and they are in agreement.  CODE STATUS:FULL Code Status History    This patient does not have a recorded code status. Please follow your organizational policy for patients in this situation.       TOTAL TIME TAKING CARE OF THIS PATIENT: 50 minutes.    Saundra Shelling M.D on 08/31/2016 at 2:36 AM  Between 7am to 6pm - Pager - [phone]  After 6pm go to [email or web address] - password [email or web address] [place] [facility]  Office  [phone]  CC: Primary care physician; Marguerita Merles, MD

## 2016-08-31 NOTE — Care Management Important Message (Signed)
Important Message  Patient Details  Name: [name] MRN: [id number] Date of Birth: 1935/06/29   Medicare Important Message Given:  Yes    Shelbie Ammons, RN 08/31/2016, 11:03 AM

## 2016-08-31 NOTE — Progress Notes (Signed)
Pharmacy Antibiotic Note  [name] is a 81 y.o. male admitted on 08/31/2016 with UTI.  Pharmacy has been consulted for ceftriaxone dosing.  Plan: Ceftriaxone 1 gm IV Q24H  Height: 5\' 7"  (170.2 cm) Weight: 299 lb (135.6 kg) IBW/kg (Calculated) : 66.1  Temp (24hrs), Avg:97.6 F (36.4 C), Min:97.6 F (36.4 C), Max:97.6 F (36.4 C)   Recent Labs Lab 08/30/16 2236  WBC 7.8  CREATININE 1.99*    Estimated Creatinine Clearance: 40 mL/min (by C-G formula based on SCr of 1.99 mg/dL (H)).    Allergies  Allergen Reactions  . Amlodipine     Constipation   . Benazepril-Hydrochlorothiazide     Constipation   . Chlorthalidone     Hyponatremia  . Other Other (See Comments)    "ANY BLOOD PRESSURE MEDICATIONS THAT I'VE TRIED" - PT. DOES NOT REMEMBER WHICH ONES    Thank you for allowing pharmacy to be a part of this patient's care.  Laural Benes, Pharm.D., BCPS Clinical Pharmacist 08/31/2016 2:37 AM

## 2016-09-01 LAB — URINE CULTURE
Culture: NO GROWTH
Special Requests: NORMAL

## 2016-09-01 LAB — BASIC METABOLIC PANEL
ANION GAP: 3 — AB (ref 5–15)
BUN: 22 mg/dL — ABNORMAL HIGH (ref 6–20)
CALCIUM: 7.8 mg/dL — AB (ref 8.9–10.3)
CO2: 26 mmol/L (ref 22–32)
Chloride: 100 mmol/L — ABNORMAL LOW (ref 101–111)
Creatinine, Ser: 2.15 mg/dL — ABNORMAL HIGH (ref 0.61–1.24)
GFR, EST AFRICAN AMERICAN: 32 mL/min — AB (ref >60)
GFR, EST NON AFRICAN AMERICAN: 28 mL/min — AB (ref >60)
Glucose, Bld: 100 mg/dL — ABNORMAL HIGH (ref 65–99)
Potassium: 4.1 mmol/L (ref 3.5–5.1)
Sodium: 129 mmol/L — ABNORMAL LOW (ref 135–145)

## 2016-09-01 MED ORDER — LORAZEPAM 0.5 MG PO TABS
0.2500 mg | ORAL_TABLET | Freq: Every day | ORAL | Status: DC
  Administered 2016-09-01 – 2016-09-05 (×5): 0.25 mg via ORAL
  Filled 2016-09-01 (×5): qty 1

## 2016-09-01 MED ORDER — ISOSORBIDE MONONITRATE ER 30 MG PO TB24
30.0000 mg | ORAL_TABLET | Freq: Every day | ORAL | Status: DC
  Administered 2016-09-01 – 2016-09-06 (×6): 30 mg via ORAL
  Filled 2016-09-01 (×6): qty 1

## 2016-09-01 NOTE — Evaluation (Signed)
Physical Therapy Evaluation Patient Details Name: [name] MRN: [id number] DOB: 1935-09-28 Today's Date: 09/01/2016   History of Present Illness  81 year old male patient with history of peripheral vascular disease, hypertension, COPD, prostate cancer, hyperlipidemia, coronary artery disease presented to the emergency room with weakness, nausea.  Clinical Impression  Pt shows good overall mobility and ability to safely ambulate but c/o pain the entire time and by the end of 200 ft of ambulation he had considerable low back and thigh pain as well as some fatigue.  Pt had been going to out patient PT and though he would like to continue that he did not feel like he could get to/from clinic easily the way he is feel at this time - PT agreed that HHPT would be appropriate at this time.     Follow Up Recommendations Home health PT    Equipment Recommendations       Recommendations for Other Services       Precautions / Restrictions Precautions Precautions: None Restrictions Weight Bearing Restrictions: No      Mobility  Bed Mobility               General bed mobility comments: Pt in recliner on arrival, not tested  Transfers Overall transfer level: Independent Equipment used: None             General transfer comment: Pt able to rise to standing w/o assist.  He c/o some LBP/hip pain but is able to rise safely.  Ambulation/Gait Ambulation/Gait assistance: Supervision Ambulation Distance (Feet): 200 Feet Assistive device: None       General Gait Details: Pt is able to maintain consistent and relatively confident cadence and speed.  He does have considerable c/o pain in thighs/back as well as some fatigue but ultimately is safe with ambulation.   Stairs            Wheelchair Mobility    Modified Rankin (Stroke Patients Only)       Balance Overall balance assessment: Modified Independent                                            Pertinent Vitals/Pain Pain Assessment: 0-10 Pain Score: 7  Pain Location: hamstrings, glutes, low back, abdomen    Home Living Family/patient expects to be discharged to:: Private residence Living Arrangements: Alone Available Help at Discharge: Family (daughter helps with driving, etc)   Home Access: Stairs to Games developer of Steps: 3          Prior Function Level of Independence: Independent         Comments: reports he has not been able to do a lot of long distance walking (uses electric shopping scooters, etc)     Hand Dominance        Extremity/Trunk Assessment   Upper Extremity Assessment: Overall WFL for tasks assessed           Lower Extremity Assessment: Overall WFL for tasks assessed         Communication   Communication: No difficulties  Cognition Arousal/Alertness: Awake/alert Behavior During Therapy: Anxious Overall Cognitive Status: Within Functional Limits for tasks assessed                      General Comments      Exercises     Assessment/Plan  PT Assessment Patient needs continued PT services  PT Problem List Pain;Decreased activity tolerance;Decreased balance;Decreased safety awareness;Obesity       PT Diagnosis   UTI (lower urinary tract infection)  Nausea  Weakness  Hyponatremia    PT Treatment Interventions Gait training;Stair training;Functional mobility training;Therapeutic activities;Therapeutic exercise;Balance training;Patient/family education    PT Goals (Current goals can be found in the Care Plan section)  Acute Rehab PT Goals Patient Stated Goal: get rid of this hamstring and back pain PT Goal Formulation: With patient Time For Goal Achievement: 09/15/16 Potential to Achieve Goals: Good    Frequency Min 2X/week   Barriers to discharge        Co-evaluation               End of Session Equipment Utilized During Treatment: Gait belt Activity  Tolerance: Patient limited by pain;Patient limited by fatigue Patient left: in chair;with call bell/phone within reach           Time: 1123-1136 PT Time Calculation (min) (ACUTE ONLY): 13 min   Charges:   PT Evaluation $PT Eval Low Complexity: 1 Procedure     PT G Codes:        Kreg Shropshire, DPT 09/01/2016, 1:52 PM

## 2016-09-01 NOTE — Care Management (Signed)
Physical therapy evaluation completed. Recommends home with home health and physical therapy. Discussed home health agencies at the bedside with [name], [name]. [name], [facility] representative updated. Nicholas Ammons RN MSN CCM Care Management [phone]

## 2016-09-01 NOTE — Progress Notes (Signed)
[place] at [place] NAME: [name]    MR#:  [id number]  DATE OF BIRTH:  17-Apr-1935  SUBJECTIVE:  CHIEF COMPLAINT:   Chief Complaint  Patient presents with  . Abdominal Pain   He feels better, no adominal pain, nausea but has Weakness. REVIEW OF SYSTEMS:  Review of Systems  Constitutional: Positive for malaise/fatigue. Negative for chills and fever.  Eyes: Negative for blurred vision and double vision.  Respiratory: Negative for cough, shortness of breath, wheezing and stridor.   Cardiovascular: Negative for chest pain, palpitations and leg swelling.  Gastrointestinal: Negative for abdominal pain, blood in stool, constipation, diarrhea, melena, nausea and vomiting.  Genitourinary: Negative for dysuria, frequency and urgency.  Musculoskeletal: Negative for joint pain.  Skin: Negative for rash.  Neurological: Positive for weakness. Negative for dizziness, focal weakness, seizures, loss of consciousness and headaches.  Psychiatric/Behavioral: Negative for depression.    DRUG ALLERGIES:   Allergies  Allergen Reactions  . Amlodipine     Constipation   . Benazepril-Hydrochlorothiazide     Constipation   . Chlorthalidone     Hyponatremia  . Other Other (See Comments)    "ANY BLOOD PRESSURE MEDICATIONS THAT I'VE TRIED" - PT. DOES NOT REMEMBER WHICH ONES   VITALS:  Blood pressure (!) 165/66, pulse 70, temperature 97.8 F (36.6 C), temperature source Oral, resp. rate 20, height 5\' 7"  (1.702 m), weight 300 lb (136.1 kg), SpO2 97 %. PHYSICAL EXAMINATION:  Physical Exam  Constitutional: He is oriented to person, place, and time and well-developed, well-nourished, and in no distress. No distress.  HENT:  Head: Normocephalic.  Mouth/Throat: Oropharynx is clear and moist.  Eyes: Conjunctivae and EOM are normal. No scleral icterus.  Neck: Normal range of motion. Neck supple. No JVD present. No thyromegaly present.  Cardiovascular:  Normal rate, regular rhythm and normal heart sounds.  Exam reveals no gallop.   No murmur heard. Pulmonary/Chest: Effort normal and breath sounds normal. No respiratory distress. He has no wheezes. He has no rales.  Abdominal: Soft. Bowel sounds are normal. He exhibits no distension. There is no tenderness.  Musculoskeletal: Normal range of motion. He exhibits no edema or tenderness.  Neurological: He is alert and oriented to person, place, and time. No cranial nerve deficit.  Skin: Skin is dry.  Psychiatric: Affect normal.   LABORATORY PANEL:   CBC  Recent Labs Lab 08/31/16 0420  WBC 7.0  HGB 10.3*  HCT 28.6*  PLT 203   ------------------------------------------------------------------------------------------------------------------ Chemistries   Recent Labs Lab 08/30/16 2236 08/31/16 0420 09/01/16 0555  NA 120* 123* 129*  K 3.9 3.3* 4.1  CL 87* 92* 100*  CO2 25 24 26   GLUCOSE 104* 101* 100*  BUN 20 19 22*  CREATININE 1.99* 1.95* 2.15*  CALCIUM 7.9* 7.4* 7.8*  MG  --  1.9  --   AST 33  --   --   ALT 17  --   --   ALKPHOS 97  --   --   BILITOT 0.5  --   --    RADIOLOGY:  No results found. ASSESSMENT AND PLAN:   81 year old male patient with history of peripheral vascular disease, hypertension, COPD, prostate cancer, hyperlipidemia, coronary artery disease presented to the emergency room with weakness, nausea.   1. Hyponatremia. Continue NS iv, f/u BMP. Improving. 2. Urinary tract infection. IV Rocephin, f/u U/C. 3. ARF with Dehydration, hold losartan. continue IVF. F/u BMP. Worse. 4. Hypertension, hold losartan. Started  hydralazine po tid and iv prn. Add imdur. 5. COPD. Stable. Neb prn.  Hypokalemia. Given KCl.  Weakness. PT: HHPT. All the records are reviewed and case discussed with Care Management/Social Worker. Management plans discussed with the patient, family and they are in agreement.  CODE STATUS: full code.  TOTAL TIME TAKING CARE OF THIS  PATIENT: 36 minutes.   More than 50% of the time was spent in counseling/coordination of care: YES  POSSIBLE D/C IN 2-3 DAYS, DEPENDING ON CLINICAL CONDITION.   Demetrios Loll M.D on 09/01/2016 at 3:10 PM  Between 7am to 6pm - Pager - [phone]  After 6pm go to [email or web address] - Proofreader  [facility]  Office  [phone]  CC: Primary care physician; Marguerita Merles, MD  Note: This dictation was prepared with Dragon dictation along with smaller phrase technology. Any transcriptional errors that result from this process are unintentional.

## 2016-09-02 ENCOUNTER — Inpatient Hospital Stay: Payer: Medicare Other

## 2016-09-02 LAB — BASIC METABOLIC PANEL
ANION GAP: 6 (ref 5–15)
BUN: 26 mg/dL — ABNORMAL HIGH (ref 6–20)
CALCIUM: 7.9 mg/dL — AB (ref 8.9–10.3)
CO2: 24 mmol/L (ref 22–32)
CREATININE: 2.05 mg/dL — AB (ref 0.61–1.24)
Chloride: 103 mmol/L (ref 101–111)
GFR calc non Af Amer: 29 mL/min — ABNORMAL LOW (ref >60)
GFR, EST AFRICAN AMERICAN: 34 mL/min — AB (ref >60)
Glucose, Bld: 100 mg/dL — ABNORMAL HIGH (ref 65–99)
Potassium: 4.3 mmol/L (ref 3.5–5.1)
SODIUM: 133 mmol/L — AB (ref 135–145)

## 2016-09-02 NOTE — Progress Notes (Signed)
[place] at [place] NAME: [name]    MR#:  [id number]  DATE OF BIRTH:  [birth year]  SUBJECTIVE: seen at bed side. complains of left lower quadrant abdominal pain, some nausea. Had a small bowel movement this morning. No shortness of breath or cough.   CHIEF COMPLAINT:   Chief Complaint  Patient presents with  . Abdominal Pain    REVIEW OF SYSTEMS:  Review of Systems  Constitutional: Positive for malaise/fatigue. Negative for chills and fever.  HENT: Negative for hearing loss.   Eyes: Negative for blurred vision, double vision and photophobia.  Respiratory: Negative for cough, hemoptysis, shortness of breath, wheezing and stridor.   Cardiovascular: Negative for chest pain, palpitations, orthopnea and leg swelling.  Gastrointestinal: Positive for abdominal pain and nausea. Negative for blood in stool, constipation, diarrhea, melena and vomiting.  Genitourinary: Negative for dysuria, frequency and urgency.  Musculoskeletal: Negative for joint pain, myalgias and neck pain.  Skin: Negative for rash.  Neurological: Positive for weakness. Negative for dizziness, focal weakness, seizures, loss of consciousness and headaches.  Psychiatric/Behavioral: Negative for depression and memory loss. The patient does not have insomnia.     DRUG ALLERGIES:   Allergies  Allergen Reactions  . Amlodipine     Constipation   . Benazepril-Hydrochlorothiazide     Constipation   . Chlorthalidone     Hyponatremia  . Other Other (See Comments)    "ANY BLOOD PRESSURE MEDICATIONS THAT I'VE TRIED" - PT. DOES NOT REMEMBER WHICH ONES   VITALS:  Blood pressure (!) 153/91, pulse 72, temperature 98.2 F (36.8 C), temperature source Oral, resp. rate 14, height 5\' 7"  (1.702 m), weight 136.1 kg (300 lb), SpO2 97 %. PHYSICAL EXAMINATION:  Physical Exam  Constitutional: He is oriented to person, place, and time and well-developed, well-nourished, and in no  distress. No distress.  HENT:  Head: Normocephalic.  Mouth/Throat: Oropharynx is clear and moist.  Eyes: Conjunctivae and EOM are normal. No scleral icterus.  Neck: Normal range of motion. Neck supple. No JVD present. No thyromegaly present.  Cardiovascular: Normal rate, regular rhythm and normal heart sounds.  Exam reveals no gallop.   No murmur heard. Pulmonary/Chest: Effort normal and breath sounds normal. No respiratory distress. He has no wheezes. He has no rales.  Abdominal: Bowel sounds are normal. He exhibits no distension. There is tenderness. There is no guarding.  Left lower quadrant tenderness present.  Musculoskeletal: Normal range of motion. He exhibits no edema or tenderness.  Neurological: He is alert and oriented to person, place, and time. No cranial nerve deficit.  Skin: Skin is dry.  Psychiatric: Affect normal.   LABORATORY PANEL:   CBC  Recent Labs Lab 08/31/16 0420  WBC 7.0  HGB 10.3*  HCT 28.6*  PLT 203   ------------------------------------------------------------------------------------------------------------------ Chemistries   Recent Labs Lab 08/30/16 2236 08/31/16 0420  09/02/16 0407  NA 120* 123*  < > 133*  K 3.9 3.3*  < > 4.3  CL 87* 92*  < > 103  CO2 25 24  < > 24  GLUCOSE 104* 101*  < > 100*  BUN 20 19  < > 26*  CREATININE 1.99* 1.95*  < > 2.05*  CALCIUM 7.9* 7.4*  < > 7.9*  MG  --  1.9  --   --   AST 33  --   --   --   ALT 17  --   --   --   ALKPHOS  97  --   --   --   BILITOT 0.5  --   --   --   < > = values in this interval not displayed. RADIOLOGY:  No results found. ASSESSMENT AND PLAN:   81 year old male patient with history of peripheral vascular disease, hypertension, COPD, prostate cancer, hyperlipidemia, coronary artery disease presented to the emergency room with weakness, nausea.   1. Hyponatremia.  Improved  IV hydration. 2.  cystitis without hematuria. Continue Rocephin. Urine cultures are negative to date.  3.  ARF with Dehydration, hold losartan. continue IVF. Function is stable the not improved yet. Check ultrasound of the kidneys.  4. Hypertension, hold losartan. Started hydralazine po tid and iv prn. Add imdur. 5. COPD. Stable. Neb prn.  Hypokalemia. Given KCl. -Improved.   7 abdominal pain left lower quadrant: Likely due to constipation: Check x-ray of the abdomen. If no obstruction We will follow the results and make further recommendations. Not stable for discharge at because of abdominal pain, nausea.  Weakness. PT: HHPT.  All the records are reviewed and case discussed with Care Management/Social Worker. Management plans discussed with the patient, family and they are in agreement.  CODE STATUS: full code.  TOTAL TIME TAKING CARE OF THIS PATIENT: 36 minutes.   More than 50% of the time was spent in counseling/coordination of care: YES  POSSIBLE D/C IN 2-3 DAYS, DEPENDING ON CLINICAL CONDITION.   Epifanio Lesches M.D on 09/02/2016 at 9:08 AM  Between 7am to 6pm - Pager - [phone]  After 6pm go to [email or web address] - Proofreader  [facility]  Office  [phone]  CC: Primary care physician; Marguerita Merles, MD  Note: This dictation was prepared with Dragon dictation along with smaller phrase technology. Any transcriptional errors that result from this process are unintentional.

## 2016-09-03 MED ORDER — LACTULOSE 10 GM/15ML PO SOLN
20.0000 g | Freq: Two times a day (BID) | ORAL | Status: DC | PRN
  Administered 2016-09-06: 20 g via ORAL
  Filled 2016-09-03: qty 30

## 2016-09-03 MED ORDER — FAMOTIDINE IN NACL 20-0.9 MG/50ML-% IV SOLN
20.0000 mg | INTRAVENOUS | Status: DC
  Administered 2016-09-03 – 2016-09-04 (×2): 20 mg via INTRAVENOUS
  Filled 2016-09-03 (×2): qty 50

## 2016-09-03 MED ORDER — ONDANSETRON HCL 4 MG PO TABS
4.0000 mg | ORAL_TABLET | ORAL | Status: DC | PRN
Start: 2016-09-03 — End: 2016-09-06
  Administered 2016-09-04: 07:00:00 4 mg via ORAL
  Filled 2016-09-03: qty 1

## 2016-09-03 MED ORDER — SIMETHICONE 80 MG PO CHEW
80.0000 mg | CHEWABLE_TABLET | Freq: Four times a day (QID) | ORAL | Status: DC
  Administered 2016-09-03 – 2016-09-06 (×13): 80 mg via ORAL
  Filled 2016-09-03 (×13): qty 1

## 2016-09-03 MED ORDER — ONDANSETRON HCL 4 MG/2ML IJ SOLN
4.0000 mg | INTRAMUSCULAR | Status: DC | PRN
  Administered 2016-09-03 (×2): 4 mg via INTRAVENOUS
  Filled 2016-09-03 (×2): qty 2

## 2016-09-03 NOTE — Progress Notes (Signed)
[place] at [place] NAME: [name]    MR#:  [id number]  DATE OF BIRTH:  November 19, 1935  SUBJECTIVE; complains of abdominal bloating, left-sided abdominal pain. Also has some nausea, poor by mouth intake, poor appetite. He says he was fine 2 days ago.   CHIEF COMPLAINT:   Chief Complaint  Patient presents with  . Abdominal Pain    REVIEW OF SYSTEMS:  Review of Systems  Constitutional: Positive for malaise/fatigue. Negative for chills and fever.  HENT: Negative for hearing loss.   Eyes: Negative for blurred vision, double vision and photophobia.  Respiratory: Negative for cough, hemoptysis, shortness of breath, wheezing and stridor.   Cardiovascular: Negative for chest pain, palpitations, orthopnea and leg swelling.  Gastrointestinal: Positive for abdominal pain and nausea. Negative for blood in stool, constipation, diarrhea, melena and vomiting.  Genitourinary: Negative for dysuria, frequency and urgency.  Musculoskeletal: Negative for joint pain, myalgias and neck pain.  Skin: Negative for rash.  Neurological: Positive for weakness. Negative for dizziness, focal weakness, seizures, loss of consciousness and headaches.  Psychiatric/Behavioral: Negative for depression and memory loss. The patient does not have insomnia.     DRUG ALLERGIES:   Allergies  Allergen Reactions  . Amlodipine     Constipation   . Benazepril-Hydrochlorothiazide     Constipation   . Chlorthalidone     Hyponatremia  . Other Other (See Comments)    "ANY BLOOD PRESSURE MEDICATIONS THAT I'VE TRIED" - PT. DOES NOT REMEMBER WHICH ONES   VITALS:  Blood pressure (!) 168/89, pulse 87, temperature 97.9 F (36.6 C), temperature source Oral, resp. rate 20, height 5\' 7"  (1.702 m), weight 136.1 kg (300 lb), SpO2 95 %. PHYSICAL EXAMINATION:  Physical Exam  Constitutional: He is oriented to person, place, and time and well-developed, well-nourished, and in no  distress. No distress.  HENT:  Head: Normocephalic.  Mouth/Throat: Oropharynx is clear and moist.  Eyes: Conjunctivae and EOM are normal. No scleral icterus.  Neck: Normal range of motion. Neck supple. No JVD present. No thyromegaly present.  Cardiovascular: Normal rate, regular rhythm and normal heart sounds.  Exam reveals no gallop.   No murmur heard. Pulmonary/Chest: Effort normal and breath sounds normal. No respiratory distress. He has no wheezes. He has no rales.  Abdominal: Bowel sounds are normal. He exhibits no distension. There is tenderness. There is no guarding.  Left lower quadrant tenderness present.  Musculoskeletal: Normal range of motion. He exhibits no edema or tenderness.  Neurological: He is alert and oriented to person, place, and time. No cranial nerve deficit.  Skin: Skin is dry.  Psychiatric: Affect normal.   LABORATORY PANEL:   CBC  Recent Labs Lab 08/31/16 0420  WBC 7.0  HGB 10.3*  HCT 28.6*  PLT 203   ------------------------------------------------------------------------------------------------------------------ Chemistries   Recent Labs Lab 08/30/16 2236 08/31/16 0420  09/02/16 0407  NA 120* 123*  < > 133*  K 3.9 3.3*  < > 4.3  CL 87* 92*  < > 103  CO2 25 24  < > 24  GLUCOSE 104* 101*  < > 100*  BUN 20 19  < > 26*  CREATININE 1.99* 1.95*  < > 2.05*  CALCIUM 7.9* 7.4*  < > 7.9*  MG  --  1.9  --   --   AST 33  --   --   --   ALT 17  --   --   --   ALKPHOS 97  --   --   --  BILITOT 0.5  --   --   --   < > = values in this interval not displayed. RADIOLOGY:  US Renal  Result Date: 09/02/2016 CLINICAL DATA:  Acute renal failure. EXAM: RENAL / URINARY TRACT ULTRASOUND COMPLETE COMPARISON:  CT, 01/01/2016 FINDINGS: Right Kidney: Length: 12.8 cm. Increased parenchymal echogenicity. Multiple cysts the. In the midpole is a 3.1 x 2.2 x 2.3 cm cyst. In the lower pole there is a cyst which have thin septations but no other complicating features.  It measures 5.2 x 3.4 x 3.3 cm. Also in the lower pole is a hypo echoic mass without internal blood flow. This is seen posteriorly. It is likely a complicated cyst. It measures 2.7 x 2.0 x 1.7 cm. No hydronephrosis appear Left Kidney: Length: 13.8 cm. Increased parenchymal echogenicity. Multiple cyst. In the posterior upper pole, there is a hypoechoic oval mass with internal blood flow measuring 3.4 x 1.9 x 2.1 cm. The also in the upper pole is a cyst with thin septations measuring 3.8 x 3.5 x 4.0 cm. Also in upper pole is a simple appearing cyst measuring 3.2 x 4.9 x 5.5 cm. No hydronephrosis. Bladder: Appears normal for degree of bladder distention. IMPRESSION: 1. No acute finding.  No hydronephrosis. 2. Increased renal parenchyma echogenicity is consistent with medical renal disease. 3. Bilateral renal cysts. Probable complicated cyst the lower pole the right kidney. 4. Possible solid mass with apparent internal blood flow within the posterior upper pole of the left kidney. Recommend further assessment with renal MRI with and without contrast. Electronically Signed   By: Lajean Manes M.D.   On: 09/02/2016 11:58   ASSESSMENT AND PLAN:   81 year old male patient with history of peripheral vascular disease, hypertension, COPD, prostate cancer, hyperlipidemia, coronary artery disease presented to the emergency room with weakness, nausea.   1. Hyponatremia.  Improved  IV hydration., 2.  cystitis without hematuria. Continue Rocephin. Urine cultures are negative to date.  3. ARF with Dehydration, renal ultrasound showed a possible mass on the upper pole of left kidney. Urology consult is appreciated. Discussed this with patient. Previous ultrasound shows cysts in the left kidney also. However patient's symptoms of abdominal pain in left side upper quadrant is new since lasted 2 days only.  4. Hypertension, hold losartan. Started hydralazine po tid and iv prn. Add imdur. 5. COPD. Stable. Neb  prn.  Hypokalemia. Given KCl. -Improved.   7 a abdominal distention due to  Gas; continue With simethicone, added lactulose. Add  PPI  Weakness. PT: HHPT.  All the records are reviewed and case discussed with Care Management/Social Worker. Management plans discussed with the patient, family and they are in agreement.  CODE STATUS: full code.  TOTAL TIME TAKING CARE OF THIS PATIENT: 36 minutes.   More than 50% of the time was spent in counseling/coordination of care: YES  POSSIBLE D/C IN 2-3 DAYS, DEPENDING ON CLINICAL CONDITION.   Epifanio Lesches M.D on 09/03/2016 at 10:05 AM  Between 7am to 6pm - Pager - [phone]  After 6pm go to [email or web address] - Proofreader  [facility]  Office  [phone]  CC: Primary care physician; Marguerita Merles, MD  Note: This dictation was prepared with Dragon dictation along with smaller phrase technology. Any transcriptional errors that result from this process are unintentional.

## 2016-09-04 DIAGNOSIS — N2889 Other specified disorders of kidney and ureter: Secondary | ICD-10-CM

## 2016-09-04 LAB — BASIC METABOLIC PANEL
ANION GAP: 3 — AB (ref 5–15)
BUN: 19 mg/dL (ref 6–20)
CHLORIDE: 104 mmol/L (ref 101–111)
CO2: 26 mmol/L (ref 22–32)
Calcium: 8.3 mg/dL — ABNORMAL LOW (ref 8.9–10.3)
Creatinine, Ser: 1.97 mg/dL — ABNORMAL HIGH (ref 0.61–1.24)
GFR calc Af Amer: 35 mL/min — ABNORMAL LOW (ref >60)
GFR, EST NON AFRICAN AMERICAN: 31 mL/min — AB (ref >60)
Glucose, Bld: 98 mg/dL (ref 65–99)
POTASSIUM: 4.8 mmol/L (ref 3.5–5.1)
Sodium: 133 mmol/L — ABNORMAL LOW (ref 135–145)

## 2016-09-04 MED ORDER — FAMOTIDINE 20 MG PO TABS
20.0000 mg | ORAL_TABLET | Freq: Every day | ORAL | Status: DC
  Administered 2016-09-05 – 2016-09-06 (×2): 20 mg via ORAL
  Filled 2016-09-04 (×2): qty 1

## 2016-09-04 MED ORDER — CEPHALEXIN 500 MG PO CAPS
500.0000 mg | ORAL_CAPSULE | Freq: Two times a day (BID) | ORAL | Status: DC
  Administered 2016-09-04 – 2016-09-06 (×4): 500 mg via ORAL
  Filled 2016-09-04 (×4): qty 1

## 2016-09-04 NOTE — Consult Note (Signed)
Urology Consult  Referring physician: Governor Specking Reason for referral: Renal mass  Chief Complaint: Renal mass  History of Present Illness: Consulted by the above provider to assess renal mass on u/souund; c/o abdominal pain and weakness and nausea; history of prostate cancer and medical co-morbidities; bipolar. Etc;; urine and blood c/s normal; renal u/sound noted bilateral cysts in kidney; many are septated and/or complicated; possible solid mass upper pole of left kidney; recommended renal MRI to characterize cysts/mass;   CTA CTscan January 2017: likely cysts in both kidney; CT from 2016 benign cysts   Baseline: nocturia x 4; frequency q 2 hours; flow is good; no GU surgery; ? No UTI  Modifying factors: There are no other modifying factors  Associated signs and symptoms: There are no other associated signs and symptoms Aggravating and relieving factors: There are no other aggravating or relieving factors Severity: Mild Duration: Persistent      Past Medical History:  Diagnosis Date  . Alcoholism ([place])   . Asthma   . Bipolar affective disorder ([place])   . COPD (chronic obstructive pulmonary disease) ([facility])    COPD  . Coronary artery disease February 2005   PCI and Taxus drug-eluting stent placement to the distal RCA (3.5 x 12 mm)  . Degenerative joint disease   . Essential hypertension   . Hyperlipidemia   . Hypertension   . Prostate CA [facility])    prostate ca dx 38 ys ago;  . Prostate CA [facility])    prostate ca dx 20 yrs ago  . PVD (peripheral vascular disease) ([place])   . Tobacco abuse    Past Surgical History:  Procedure Laterality Date  . CARDIAC CATHETERIZATION    . CORONARY ANGIOPLASTY WITH STENT PLACEMENT    . TOTAL HIP ARTHROPLASTY     right    Medications: I have reviewed the patient's current medications. Allergies:  Allergies  Allergen Reactions  . Amlodipine     Constipation   . Benazepril-Hydrochlorothiazide     Constipation   . Chlorthalidone    Hyponatremia  . Other Other (See Comments)    "ANY BLOOD PRESSURE MEDICATIONS THAT I'VE TRIED" - PT. DOES NOT REMEMBER WHICH ONES    Family History  Problem Relation Age of Onset  . Hypertension Mother   . Hyperlipidemia Mother   . Heart attack Mother   . Hypertension Father   . Hyperlipidemia Father   . Heart attack Father    Social History:  reports that he has quit smoking. His smoking use included Cigarettes. He has a 50.00 pack-year smoking history. He has never used smokeless tobacco. He reports that he drinks alcohol. He reports that he does not use drugs.  ROS: All systems are reviewed and negative except as noted. Rest negative  Physical Exam:  Vital signs in last 24 hours: Temp:  [97.7 F (36.5 C)-98.5 F (36.9 C)] 97.7 F (36.5 C) (09/18 1547) Pulse Rate:  [75-91] 85 (09/18 1710) Resp:  [18-22] 22 (09/18 1547) BP: (168-195)/(69-93) 182/82 (09/18 1710) SpO2:  [93 %-97 %] 93 % (09/18 1710)  Cardiovascular: Skin warm; not flushed Respiratory: Breaths quiet; no shortness of breath Abdomen: No masses Neurological: Normal sensation to touch Musculoskeletal: Normal motor function arms and legs Lymphatics: No inguinal adenopathy Skin: No rashes Genitourinary:male genitalia normal; no CVA tender  Laboratory Data:  Results for orders placed or performed during the hospital encounter of 08/31/16 (from the past 72 hour(s))  Basic metabolic panel     Status: Abnormal   Collection  Time: 09/02/16  4:07 AM  Result Value Ref Range   Sodium 133 (L) 135 - 145 mmol/L   Potassium 4.3 3.5 - 5.1 mmol/L   Chloride 103 101 - 111 mmol/L   CO2 24 22 - 32 mmol/L   Glucose, Bld 100 (H) 65 - 99 mg/dL   BUN 26 (H) 6 - 20 mg/dL   Creatinine, Ser 2.05 (H) 0.61 - 1.24 mg/dL   Calcium 7.9 (L) 8.9 - 10.3 mg/dL   GFR calc non Af Amer 29 (L) >60 mL/min   GFR calc Af Amer 34 (L) >60 mL/min    Comment: (NOTE) The eGFR has been calculated using the CKD EPI equation. This calculation has not  been validated in all clinical situations. eGFR's persistently <60 mL/min signify possible Chronic Kidney Disease.    Anion gap 6 5 - 15  Basic metabolic panel     Status: Abnormal   Collection Time: 09/04/16  4:08 AM  Result Value Ref Range   Sodium 133 (L) 135 - 145 mmol/L   Potassium 4.8 3.5 - 5.1 mmol/L   Chloride 104 101 - 111 mmol/L   CO2 26 22 - 32 mmol/L   Glucose, Bld 98 65 - 99 mg/dL   BUN 19 6 - 20 mg/dL   Creatinine, Ser 1.97 (H) 0.61 - 1.24 mg/dL   Calcium 8.3 (L) 8.9 - 10.3 mg/dL   GFR calc non Af Amer 31 (L) >60 mL/min   GFR calc Af Amer 35 (L) >60 mL/min    Comment: (NOTE) The eGFR has been calculated using the CKD EPI equation. This calculation has not been validated in all clinical situations. eGFR's persistently <60 mL/min signify possible Chronic Kidney Disease.    Anion gap 3 (L) 5 - 15   Recent Results (from the past 240 hour(s))  Urine culture     Status: None   Collection Time: 08/30/16 10:49 PM  Result Value Ref Range Status   Specimen Description URINE, RANDOM  Final   Special Requests Normal  Final   Culture NO GROWTH Performed at [facility]   Final   Report Status 09/01/2016 FINAL  Final  Culture, blood (routine x 2)     Status: None (Preliminary result)   Collection Time: 08/31/16 12:20 AM  Result Value Ref Range Status   Specimen Description BLOOD  RIGHT AC  Final   Special Requests   Final    BOTTLES DRAWN AEROBIC AND ANAEROBIC  ANA 7ML AER 9ML   Culture NO GROWTH 4 DAYS  Final   Report Status PENDING  Incomplete  Culture, blood (routine x 2)     Status: None (Preliminary result)   Collection Time: 08/31/16 12:30 AM  Result Value Ref Range Status   Specimen Description BLOOD  RIGHT HAND   Final   Special Requests   Final    BOTTLES DRAWN AEROBIC AND ANAEROBIC  ANA 8ML AER 8ML   Culture NO GROWTH 4 DAYS  Final   Report Status PENDING  Incomplete   Creatinine:  Recent Labs  08/30/16 2236 08/31/16 0420 09/01/16 0555  09/02/16 0407 09/04/16 0408  CREATININE 1.99* 1.95* 2.15* 2.05* 1.97*    Xrays: See report/chart As above  Impression/Assessment:  Renal cysts requiring MRI to make sure no malignancy  Plan:  Recommend renal MRI with and without contrast to make sure no cancer Will follow thanks  Kymia Simi A 09/04/2016, 5:20 PM

## 2016-09-04 NOTE — Care Management Important Message (Signed)
Important Message  Patient Details  Name: [name] MRN: [id number] Date of Birth: 09-Sep-1935   Medicare Important Message Given:  Yes    Shelbie Ammons, RN 09/04/2016, 8:05 AM

## 2016-09-04 NOTE — Progress Notes (Signed)
[facility] [place] NAME: [name]    MR#:  [id number]  DATE OF BIRTH:  23-Mar-1935  SUBJECTIVE; patient says that he feels better today less abdominal pain. Tolerating the diet.   CHIEF COMPLAINT:   Chief Complaint  Patient presents with  . Abdominal Pain    REVIEW OF SYSTEMS:  Review of Systems  Constitutional: Negative for chills, diaphoresis, fever and malaise/fatigue.  HENT: Negative for hearing loss.   Eyes: Negative for blurred vision, double vision and photophobia.  Respiratory: Negative for cough, hemoptysis, shortness of breath, wheezing and stridor.   Cardiovascular: Negative for chest pain, palpitations, orthopnea and leg swelling.  Gastrointestinal: Negative for blood in stool, constipation, diarrhea, melena, nausea and vomiting.  Genitourinary: Negative for dysuria, frequency and urgency.  Musculoskeletal: Negative for joint pain, myalgias and neck pain.  Skin: Negative for rash.  Neurological: Negative for dizziness, focal weakness, seizures, loss of consciousness, weakness and headaches.  Psychiatric/Behavioral: Negative for depression and memory loss. The patient does not have insomnia.     DRUG ALLERGIES:   Allergies  Allergen Reactions  . Amlodipine     Constipation   . Benazepril-Hydrochlorothiazide     Constipation   . Chlorthalidone     Hyponatremia  . Other Other (See Comments)    "ANY BLOOD PRESSURE MEDICATIONS THAT I'VE TRIED" - PT. DOES NOT REMEMBER WHICH ONES   VITALS:  Blood pressure (!) 168/73, pulse 89, temperature 98 F (36.7 C), temperature source Oral, resp. rate 18, height 5\' 7"  (1.702 m), weight 136.1 kg (300 lb), SpO2 95 %. PHYSICAL EXAMINATION:  Physical Exam  Constitutional: He is oriented to person, place, and time and well-developed, well-nourished, and in no distress. No distress.  HENT:  Head: Normocephalic.  Mouth/Throat: Oropharynx is clear and moist.  Eyes: Conjunctivae  and EOM are normal. No scleral icterus.  Neck: Normal range of motion. Neck supple. No JVD present. No thyromegaly present.  Cardiovascular: Normal rate, regular rhythm and normal heart sounds.  Exam reveals no gallop.   No murmur heard. Pulmonary/Chest: Effort normal and breath sounds normal. No respiratory distress. He has no wheezes. He has no rales.  Abdominal: Bowel sounds are normal. He exhibits no distension. There is no tenderness. There is no guarding.  No tenderness in the left lower quadrant..  Musculoskeletal: Normal range of motion. He exhibits no edema or tenderness.  Neurological: He is alert and oriented to person, place, and time. No cranial nerve deficit.  Skin: Skin is dry.  Psychiatric: Affect normal.   LABORATORY PANEL:   CBC  Recent Labs Lab 08/31/16 0420  WBC 7.0  HGB 10.3*  HCT 28.6*  PLT 203   ------------------------------------------------------------------------------------------------------------------ Chemistries   Recent Labs Lab 08/30/16 2236 08/31/16 0420  09/04/16 0408  NA 120* 123*  < > 133*  K 3.9 3.3*  < > 4.8  CL 87* 92*  < > 104  CO2 25 24  < > 26  GLUCOSE 104* 101*  < > 98  BUN 20 19  < > 19  CREATININE 1.99* 1.95*  < > 1.97*  CALCIUM 7.9* 7.4*  < > 8.3*  MG  --  1.9  --   --   AST 33  --   --   --   ALT 17  --   --   --   ALKPHOS 97  --   --   --   BILITOT 0.5  --   --   --   < > =  values in this interval not displayed. RADIOLOGY:  No results found. ASSESSMENT AND PLAN:   81 year old male patient with history of peripheral vascular disease, hypertension, COPD, prostate cancer, hyperlipidemia, coronary artery disease presented to the emergency room with weakness, nausea.   1. Hyponatremia.  Improved  IV hydration., 2.  cystitis without hematuria. Continue Rocephin. Urine cultures are negative to date.  3. ARF with Dehydration, renal ultrasound showed a possible mass on the upper pole of left kidney. Urology consult is  appreciated. Discussed this with patient. Previous ultrasound shows cysts in the left kidney also. However patient's symptoms of abdominal pain in left side upper quadrant is new since lasted 2 days only.ollow up with urology as an outpatient if patient is not seen by urology today.  4. Hypertension, hold losartan. Started hydralazine po tid and iv prn. Add imdur. 5. COPD. Stable. Neb prn.  Hypokalemia. Given KCl. -Improved.   7 a abdominal distention due to  Gas; continue With simethicone, added lactulose. Add  PPI abdominal distention, nausea, abdominal pain improved with this treatment. #8 sleep apnea: Started CPAP. Likely discharge tomorrow.  Weakness. PT: HHPT. Appreciate presence of RN during  Rounds. All the records are reviewed and case discussed with Care Management/Social Worker. Management plans discussed with the patient, family and they are in agreement.  CODE STATUS: full code.  TOTAL TIME TAKING CARE OF THIS PATIENT: 36 minutes.   More than 50% of the time was spent in counseling/coordination of care: YES  POSSIBLE D/C IN 2-3 DAYS, DEPENDING ON CLINICAL CONDITION.   Epifanio Lesches M.D on 09/04/2016 at 11:32 AM  Between 7am to 6pm - Pager - [phone]  After 6pm go to [email or web address] - Proofreader  [facility]  Office  [phone]  CC: Primary care physician; Marguerita Merles, MD  Note: This dictation was prepared with Dragon dictation along with smaller phrase technology. Any transcriptional errors that result from this process are unintentional.

## 2016-09-04 NOTE — Progress Notes (Signed)
Pharmacy Antibiotic Note  [name] is a 81 y.o. male admitted on 08/31/2016 with UTI.  Pharmacy has been consulted for ceftriaxone dosing.  Plan: Continue Ceftriaxone 1 gm IV Q24H.   Height: 5\' 7"  (170.2 cm) Weight: 300 lb (136.1 kg) IBW/kg (Calculated) : 66.1  Temp (24hrs), Avg:98 F (36.7 C), Min:97.7 F (36.5 C), Max:98.5 F (36.9 C)   Recent Labs Lab 08/30/16 2236 08/31/16 0420 09/01/16 0555 09/02/16 0407 09/04/16 0408  WBC 7.8 7.0  --   --   --   CREATININE 1.99* 1.95* 2.15* 2.05* 1.97*    Estimated Creatinine Clearance: 40.5 mL/min (by C-G formula based on SCr of 1.97 mg/dL (H)).    Allergies  Allergen Reactions  . Amlodipine     Constipation   . Benazepril-Hydrochlorothiazide     Constipation   . Chlorthalidone     Hyponatremia  . Other Other (See Comments)    "ANY BLOOD PRESSURE MEDICATIONS THAT I'VE TRIED" - PT. DOES NOT REMEMBER WHICH ONES   Antibiotics: Ceftriaxone 9/14 >>   Cultures: 9/13: urine cx: NG 9/14: blood cx: NGTD   Thank you for allowing pharmacy to be a part of this patient's care.  Rocky Morel, PharmD Clinical Pharmacist 09/04/2016 10:04 AM

## 2016-09-05 ENCOUNTER — Inpatient Hospital Stay: Payer: Medicare Other

## 2016-09-05 LAB — CULTURE, BLOOD (ROUTINE X 2)
CULTURE: NO GROWTH
Culture: NO GROWTH

## 2016-09-05 MED ORDER — METOPROLOL TARTRATE 25 MG PO TABS
25.0000 mg | ORAL_TABLET | Freq: Once | ORAL | Status: AC
  Administered 2016-09-05: 25 mg via ORAL
  Filled 2016-09-05: qty 1

## 2016-09-05 MED ORDER — METOPROLOL TARTRATE 50 MG PO TABS
50.0000 mg | ORAL_TABLET | Freq: Two times a day (BID) | ORAL | Status: DC
  Administered 2016-09-05 – 2016-09-06 (×2): 50 mg via ORAL
  Filled 2016-09-05 (×2): qty 1

## 2016-09-05 MED ORDER — METHYLPREDNISOLONE SODIUM SUCC 125 MG IJ SOLR
60.0000 mg | INTRAMUSCULAR | Status: DC
  Administered 2016-09-05 – 2016-09-06 (×2): 60 mg via INTRAVENOUS
  Filled 2016-09-05 (×2): qty 2

## 2016-09-05 NOTE — Progress Notes (Signed)
[name]    MR#:  [id number]  DATE OF BIRTH:  [birth year]  SUBJECTIVE; more wheezing today.  Did Not tolerate the CPAP last night.   CHIEF COMPLAINT:   Chief Complaint  Patient presents with  . Abdominal Pain    REVIEW OF SYSTEMS:  Review of Systems  Constitutional: Negative for chills, diaphoresis, fever and malaise/fatigue.  HENT: Negative for hearing loss.   Eyes: Negative for blurred vision, double vision and photophobia.  Respiratory: Positive for cough, shortness of breath and wheezing. Negative for hemoptysis and stridor.   Cardiovascular: Negative for chest pain, palpitations, orthopnea and leg swelling.  Gastrointestinal: Negative for blood in stool, constipation, diarrhea, melena, nausea and vomiting.  Genitourinary: Negative for dysuria, frequency and urgency.  Musculoskeletal: Negative for joint pain, myalgias and neck pain.  Skin: Negative for rash.  Neurological: Negative for dizziness, focal weakness, seizures, loss of consciousness, weakness and headaches.  Psychiatric/Behavioral: Negative for depression and memory loss. The patient does not have insomnia.     DRUG ALLERGIES:   Allergies  Allergen Reactions  . Amlodipine     Constipation   . Benazepril-Hydrochlorothiazide     Constipation   . Chlorthalidone     Hyponatremia  . Other Other (See Comments)    "ANY BLOOD PRESSURE MEDICATIONS THAT I'VE TRIED" - PT. DOES NOT REMEMBER WHICH ONES   VITALS:  Blood pressure (!) 180/77, pulse 85, temperature 98.1 F (36.7 C), temperature source Oral, resp. rate 20, height 5\' 7"  (1.702 m), weight 136.1 kg (300 lb), SpO2 96 %. PHYSICAL EXAMINATION:  Physical Exam  Constitutional: He is oriented to person, place, and time and well-developed, well-nourished, and in no distress. No distress.  HENT:  Head: Normocephalic.  Mouth/Throat: Oropharynx is clear and moist.  Eyes: Conjunctivae and  EOM are normal. No scleral icterus.  Neck: Normal range of motion. Neck supple. No JVD present. No thyromegaly present.  Cardiovascular: Normal rate, regular rhythm and normal heart sounds.  Exam reveals no gallop.   No murmur heard. Pulmonary/Chest: Effort normal. He has wheezes. He exhibits no tenderness.  Abdominal: Bowel sounds are normal. There is no tenderness. There is no guarding.  No tenderness in the left lower quadrant..  Musculoskeletal: Normal range of motion. He exhibits no edema or tenderness.  Neurological: He is alert and oriented to person, place, and time. No cranial nerve deficit.  Skin: Skin is dry.  Psychiatric: Affect normal.   LABORATORY PANEL:   CBC  Recent Labs Lab 08/31/16 0420  WBC 7.0  HGB 10.3*  HCT 28.6*  PLT 203   ------------------------------------------------------------------------------------------------------------------ Chemistries   Recent Labs Lab 08/30/16 2236 08/31/16 0420  09/04/16 0408  NA 120* 123*  < > 133*  K 3.9 3.3*  < > 4.8  CL 87* 92*  < > 104  CO2 25 24  < > 26  GLUCOSE 104* 101*  < > 98  BUN 20 19  < > 19  CREATININE 1.99* 1.95*  < > 1.97*  CALCIUM 7.9* 7.4*  < > 8.3*  MG  --  1.9  --   --   AST 33  --   --   --   ALT 17  --   --   --   ALKPHOS 97  --   --   --   BILITOT 0.5  --   --   --   < > = values in this  interval not displayed. RADIOLOGY:  No results found. ASSESSMENT AND PLAN:   81 year old male patient with history of peripheral vascular disease, hypertension, COPD, prostate cancer, hyperlipidemia, coronary artery disease presented to the emergency room with weakness, nausea.   1. Hyponatremia.  Improved  IV hydration., 2.  cystitis without hematuria. Continue Rocephin. Urine cultures are negative to date.  3. ARF with Dehydration,Improved with hydration.    renal ultrasound showed a possible mass on the upper pole of left kidney. Seen by urology, ordered the MRI of the kidneys to evaluate  further  4. Hypertension, uncontrolled: Include the metoprolol.  5. Acute COPD exacerbation: Wheezing this morning. Started on IV steroids, nebulizers. Patient has a wheezing bilaterally in the lungs and he also complains of  shortness of breath.  Hypokalemia. Given KCl. -Improved.   7 a abdominal distention due to  Gas;  Improving . Check H. pylori.  #8 sleep apnea: Started CPAP., But did not tolerate last night. Needs adjustment he says for he uses 4 cm pressure at home.   Likely discharge tomorrow.  Weakness. PT: HHPT. Appreciate presence of RN during  Rounds. All the records are reviewed and case discussed with Care Management/Social Worker. Management plans discussed with the patient, family and they are in agreement.  CODE STATUS: full code.  TOTAL TIME TAKING CARE OF THIS PATIENT: 36 minutes.   More than 50% of the time was spent in counseling/coordination of care: YES  POSSIBLE D/C IN 2-3 DAYS, DEPENDING ON CLINICAL CONDITION.   Epifanio Lesches M.D on 09/05/2016 at 10:34 AM  Between 7am to 6pm - Pager - [phone]  After 6pm go to [email or web address] - Proofreader  [facility]  Office  [phone]  CC: Primary care physician; Marguerita Merles, MD  Note: This dictation was prepared with Dragon dictation along with smaller phrase technology. Any transcriptional errors that result from this process are unintentional.

## 2016-09-06 ENCOUNTER — Telehealth: Payer: Self-pay | Admitting: *Deleted

## 2016-09-06 ENCOUNTER — Encounter: Payer: Self-pay | Admitting: *Deleted

## 2016-09-06 DIAGNOSIS — G4730 Sleep apnea, unspecified: Secondary | ICD-10-CM

## 2016-09-06 DIAGNOSIS — J449 Chronic obstructive pulmonary disease, unspecified: Secondary | ICD-10-CM

## 2016-09-06 DIAGNOSIS — E871 Hypo-osmolality and hyponatremia: Secondary | ICD-10-CM

## 2016-09-06 MED ORDER — CEPHALEXIN 500 MG PO CAPS: 500.0000 mg | ORAL_CAPSULE | Freq: Two times a day (BID) | ORAL | 0 refills | Status: DC

## 2016-09-06 MED ORDER — METOPROLOL TARTRATE 50 MG PO TABS
50.0000 mg | ORAL_TABLET | Freq: Two times a day (BID) | ORAL | 0 refills | Status: DC
Start: 2016-09-06 — End: 2017-09-04

## 2016-09-06 MED ORDER — FAMOTIDINE 20 MG PO TABS: 20.0000 mg | ORAL_TABLET | Freq: Every day | ORAL | 0 refills | Status: DC

## 2016-09-06 MED ORDER — HYDRALAZINE HCL 25 MG PO TABS: 25.0000 mg | ORAL_TABLET | Freq: Three times a day (TID) | ORAL | 0 refills | Status: DC

## 2016-09-06 MED ORDER — PREDNISONE 10 MG (21) PO TBPK: 10.0000 mg | ORAL_TABLET | Freq: Every day | ORAL | 0 refills | Status: DC

## 2016-09-06 NOTE — Care Management (Signed)
Discharge to home today per Dr. Vianne Bulls. Will be followed by [facility]. Will update Floydene Flock, [facility] representative. Shelbie Ammons RN MSN CCM Care Management [phone]

## 2016-09-06 NOTE — Progress Notes (Signed)
Received MD order to discharge patient to home with home health, reviewed home med, prescriptions and follow up appointment with patient and he verbalized understanding discharged to home with family member in wheelchair

## 2016-09-06 NOTE — Progress Notes (Signed)
Urology f/u  Patient being discharged today. Follow-up of renal with MRI (noncontrast) shows no evidence of solid renal mass. Findings were discussed with the patient and his family member bedside today. All his questions were answered.  Exam NAD Obese, NT No CVA tenderness Family member at bedside  CLINICAL DATA:  Evaluate left renal lesion seen on recent ultrasound.  EXAM: MRI ABDOMEN WITHOUT CONTRAST  TECHNIQUE: Multiplanar multisequence MR imaging was performed without the administration of intravenous contrast.  COMPARISON:  Ultrasound 09/02/2016  FINDINGS: Lower chest: No worrisome pulmonary lesions. No pleural or pericardial effusion.  Hepatobiliary: No focal hepatic lesions or intrahepatic biliary dilatation. The gallbladder is normal. No common bile duct dilatation.  Pancreas:  No mass, inflammation or duct dilatation.  Spleen:  Adrenals/Urinary Tract: The adrenal glands are unremarkable.There are numerous bilateral renal cysts which are complicated by septations and some by hemorrhage. I do not see a solid mass or worrisome renal lesion. Examination is somewhat limited without IV contrast. No hydronephrosis.  Stomach/Bowel: The stomach, duodenum, visualized small bowel and visualized colon are unremarkable.  Vascular/Lymphatic:  No adenopathy.  The aorta is normal in caliber.  Other:  No ascites or abdominal wall hernia.  Musculoskeletal: No significant bony findings.  IMPRESSION: 1. Examination is somewhat limited by lack of IV contrast. 2. No worrisome renal lesions are identified. There are septated and hemorrhagic cysts but no worrisome solid lesions are identified. Recommend followup renal ultrasound examination in 1 year.   Electronically Signed   By: Marijo Sanes M.D.   On: 09/05/2016 16:44  MRI personally reviewed today  A/P:  Bilateral renal cyst- Likely Bosniak I and II cyst, low risk of malignancy  Recommend out  follow up in 1 year- patient understands

## 2016-09-06 NOTE — Telephone Encounter (Signed)
[name] called to let us know that [name] had been out because he was in the hospital with his kidneys.  He was discharged yesterday.  She is going to call to get clearance to return to rehab and talk to her Dad about whether he wants to finish out or come to [facility].

## 2016-09-07 NOTE — Discharge Summary (Signed)
[name], is a 81 y.o. male  DOB October 30, 1935  MRN [id number].  Admission date:  08/31/2016  Admitting Physician  Saundra Shelling, MD  Discharge Date:  09/06/2016   Primary MD  Marguerita Merles, MD  Recommendations for primary care physician for things to follow:   Follow-up with primary doctor in 1 week  Admission Diagnosis  Hyponatremia [E87.1] Nausea [R11.0] Weakness [R53.1] UTI (lower urinary tract infection) [N39.0]   Discharge Diagnosis  Hyponatremia [E87.1] Nausea [R11.0] Weakness [R53.1] UTI (lower urinary tract infection) [N39.0]    Principal Problem:   Hyponatremia Active Problems:   UTI (lower urinary tract infection)      Past Medical History:  Diagnosis Date  . Alcoholism ([place])   . Asthma   . Bipolar affective disorder ([place])   . COPD (chronic obstructive pulmonary disease) (HCC)    COPD  . Coronary artery disease February 2005   PCI and Taxus drug-eluting stent placement to the distal RCA (3.5 x 12 mm)  . Degenerative joint disease   . Essential hypertension   . Hyperlipidemia   . Hypertension   . Prostate CA [facility])    prostate ca dx 33 ys ago;  . Prostate CA [facility])    prostate ca dx 20 yrs ago  . PVD (peripheral vascular disease) ([place])   . Tobacco abuse     Past Surgical History:  Procedure Laterality Date  . CARDIAC CATHETERIZATION    . CORONARY ANGIOPLASTY WITH STENT PLACEMENT    . TOTAL HIP ARTHROPLASTY     right       History of present illness and  Hospital Course:     Kindly see H&P for history of present illness and admission details, please review complete Labs, Consult reports and Test reports for all details in brief  HPI  from the history and physical done on the day of admission 81 year old male patient admitted because of abdominal pain. Patient has history of bipolar  disorder, COPD, CAD, hyperlipidemia, prostate cancer, PVD. Patient to also had vomiting at home. Patient found to have hyponatremia with sodium 120. And admitted to hospitalist service.   Hospital Course  #1 abdominal pain with vomiting ; due to gastritis: Improved with IV PPIs, IV fluids, IV nausea medicine. #2 hyponatremia secondary to vomiting as: Improved with IV fluids. 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 Acute renal failure improved with IV hydration.  creatinine on admission 2.15. And improved to 1.97. Patient does have chronic renal failure with CK D Ceja 3: #4 UTI: Patient  UA on admission showed turbid urine with the numerous WBC, but urine cultures did not show any growth. Discharged  home to finish the course of Keflex  #5 renal cyst: Patient ultrasound of kidneys showed a possible left renal mass. Seen by urology, patient had abdominal CAT scan which she did not show any renal mass. And had history of renal cyst. #6 COPD exacerbation: Patient did have wheezing, started on steroids, nebulizers. Improved with  steroids. Discharged to home with tapering course of prednisone. Vision to continue to take his Symbicort, Spiriva. #7 hypokalemia improved with replacement of potassium. #7 constipation improved with stool softeners #8 hypertension: Uncontrolled patient and her blood pressure has been stabilized on metoprolol,, hydralazine, losartan. Hydralazine has been added during this admission.  Discharge Condition: stable Follow UP  Follow-up Information    Marguerita Merles, MD On 09/20/2016.   Specialty:  Family Medicine Why:  at 2:00 p.m. Contact information: [address]  [place] [address] [phone]             Discharge Instructions  and  Discharge Medications    Discharge Instructions    Face-to-face encounter (required for Medicare/Medicaid patients)    Complete by:  As directed    I [facility] certify that this  patient is under my care and that I, or a nurse practitioner or physician's assistant working with me, had a face-to-face encounter that meets the physician face-to-face encounter requirements with this patient on 09/06/2016. The encounter with the patient was in whole, or in part for the following medical condition(s) which is the primary reason for home health care  Copd uti Deconditioning htn   The encounter with the patient was in whole, or in part, for the following medical condition, which is the primary reason for home health care:  whole   I certify that, based on my findings, the following services are medically necessary home health services:  Physical therapy   Reason for Medically Necessary Home Health Services:  Therapy- Personnel officer, Public librarian   My clinical findings support the need for the above services:  Shortness of breath with activity   Further, I certify that my clinical findings support that this patient is homebound due to:  Shortness of Breath with activity   Home Health    Complete by:  As directed    To provide the following care/treatments:  PT       Medication List    TAKE these medications   aspirin 81 MG tablet Take 81 mg by mouth daily.   atorvastatin 40 MG tablet Commonly known as:  LIPITOR Take 1 tablet (40 mg total) by mouth daily.   cephALEXin 500 MG capsule Commonly known as:  KEFLEX Take 1 capsule (500 mg total) by mouth every 12 (twelve) hours.   docusate sodium 50 MG capsule Commonly known as:  COLACE Take 50 mg by mouth 2 (two) times daily.   famotidine 20 MG tablet Commonly known as:  PEPCID Take 1 tablet (20 mg total) by mouth daily.   fluticasone-salmeterol 115-21 MCG/ACT inhaler Commonly known as:  ADVAIR HFA Inhale 2 puffs into the lungs 2 (two) times daily.   hydrALAZINE 25 MG tablet Commonly known as:  APRESOLINE Take 1 tablet (25 mg total) by mouth every 8 (eight) hours.    HYDROcodone-acetaminophen 5-325 MG tablet Commonly known as:  NORCO/VICODIN Take 1-2 tablets by mouth every 6 (six) hours as needed for moderate pain.   levothyroxine 50 MCG tablet Commonly known as:  SYNTHROID, LEVOTHROID Take 50 mcg by mouth daily.   losartan 100 MG tablet Commonly known as:  COZAAR Take 1 tablet (100 mg total) by mouth daily.   magnesium oxide 400 MG tablet Commonly known as:  MAG-OX Take 400 mg by mouth daily.   metoprolol 50 MG tablet Commonly known as:  LOPRESSOR Take 1 tablet (50 mg total) by mouth 2 (two) times daily.   NITROSTAT 0.4 MG SL tablet Generic drug:  nitroGLYCERIN Take 0.4 mg by mouth daily as needed. As needed for chest pain   ondansetron 4 MG disintegrating tablet Commonly known as:  ZOFRAN ODT Take 1 tablet (4 mg total) by mouth every 6 (six) hours as needed for nausea or vomiting.   phentermine 30 MG capsule Take 30 mg by mouth every morning.   predniSONE 10 MG (21) Tbpk tablet Commonly known as:  STERAPRED UNI-PAK 21 TAB Take 1 tablet (10 mg  total) by mouth daily. Take as directed,taper by 10 mg daily   PROAIR HFA 108 (90 Base) MCG/ACT inhaler Generic drug:  albuterol Inhale 2 puffs into the lungs every 6 (six) hours as needed. Reported on 04/21/2016   tiotropium 18 MCG inhalation capsule Commonly known as:  SPIRIVA Place 18 mcg into inhaler and inhale daily.         Diet and Activity recommendation: See Discharge Instructions above   Consults obtained - Urology, physical therapy   Major procedures and Radiology Reports - PLEASE review detailed and final reports for all details, in brief -      Mr Abdomen Wo Contrast  Result Date: 09/05/2016 CLINICAL DATA:  Evaluate left renal lesion seen on recent ultrasound. EXAM: MRI ABDOMEN WITHOUT CONTRAST TECHNIQUE: Multiplanar multisequence MR imaging was performed without the administration of intravenous contrast. COMPARISON:  Ultrasound 09/02/2016 FINDINGS: Lower chest: No  worrisome pulmonary lesions. No pleural or pericardial effusion. Hepatobiliary: No focal hepatic lesions or intrahepatic biliary dilatation. The gallbladder is normal. No common bile duct dilatation. Pancreas:  No mass, inflammation or duct dilatation. Spleen: Adrenals/Urinary Tract: The adrenal glands are unremarkable.There are numerous bilateral renal cysts which are complicated by septations and some by hemorrhage. I do not see a solid mass or worrisome renal lesion. Examination is somewhat limited without IV contrast. No hydronephrosis. Stomach/Bowel: The stomach, duodenum, visualized small bowel and visualized colon are unremarkable. Vascular/Lymphatic:  No adenopathy.  The aorta is normal in caliber. Other:  No ascites or abdominal wall hernia. Musculoskeletal: No significant bony findings. IMPRESSION: 1. Examination is somewhat limited by lack of IV contrast. 2. No worrisome renal lesions are identified. There are septated and hemorrhagic cysts but no worrisome solid lesions are identified. Recommend followup renal ultrasound examination in 1 year. Electronically Signed   By: Marijo Sanes M.D.   On: 09/05/2016 16:44   US Renal  Result Date: 09/02/2016 CLINICAL DATA:  Acute renal failure. EXAM: RENAL / URINARY TRACT ULTRASOUND COMPLETE COMPARISON:  CT, 01/01/2016 FINDINGS: Right Kidney: Length: 12.8 cm. Increased parenchymal echogenicity. Multiple cysts the. In the midpole is a 3.1 x 2.2 x 2.3 cm cyst. In the lower pole there is a cyst which have thin septations but no other complicating features. It measures 5.2 x 3.4 x 3.3 cm. Also in the lower pole is a hypo echoic mass without internal blood flow. This is seen posteriorly. It is likely a complicated cyst. It measures 2.7 x 2.0 x 1.7 cm. No hydronephrosis appear Left Kidney: Length: 13.8 cm. Increased parenchymal echogenicity. Multiple cyst. In the posterior upper pole, there is a hypoechoic oval mass with internal blood flow measuring 3.4 x 1.9 x 2.1  cm. The also in the upper pole is a cyst with thin septations measuring 3.8 x 3.5 x 4.0 cm. Also in upper pole is a simple appearing cyst measuring 3.2 x 4.9 x 5.5 cm. No hydronephrosis. Bladder: Appears normal for degree of bladder distention. IMPRESSION: 1. No acute finding.  No hydronephrosis. 2. Increased renal parenchyma echogenicity is consistent with medical renal disease. 3. Bilateral renal cysts. Probable complicated cyst the lower pole the right kidney. 4. Possible solid mass with apparent internal blood flow within the posterior upper pole of the left kidney. Recommend further assessment with renal MRI with and without contrast. Electronically Signed   By: Lajean Manes M.D.   On: 09/02/2016 11:58   Dg Abd 2 Views  Result Date: 09/02/2016 CLINICAL DATA:  Left lower quadrant abdominal pain.  Some  nausea. EXAM: CT, 01/01/2016 COMPARISON:  None. FINDINGS: Normal bowel gas pattern.  No obstruction or free air. Multiple calcifications in the left upper quadrant reflect healed splenic granuloma. No evidence of renal or ureteral stones. There are aortoiliac vascular calcifications. The soft tissues are otherwise unremarkable. Status post right hip total arthroplasty and pinning of a previous left proximal femur fracture. IMPRESSION: 1. No acute findings.  No evidence of bowel obstruction or free air. Electronically Signed   By: Lajean Manes M.D.   On: 09/02/2016 11:52    Micro Results    Recent Results (from the past 240 hour(s))  Urine culture     Status: None   Collection Time: 08/30/16 10:49 PM  Result Value Ref Range Status   Specimen Description URINE, RANDOM  Final   Special Requests Normal  Final   Culture NO GROWTH Performed at [facility]   Final   Report Status 09/01/2016 FINAL  Final  Culture, blood (routine x 2)     Status: None   Collection Time: 08/31/16 12:20 AM  Result Value Ref Range Status   Specimen Description BLOOD  RIGHT AC  Final   Special Requests   Final     BOTTLES DRAWN AEROBIC AND ANAEROBIC  ANA 7ML AER 9ML   Culture NO GROWTH 5 DAYS  Final   Report Status 09/05/2016 FINAL  Final  Culture, blood (routine x 2)     Status: None   Collection Time: 08/31/16 12:30 AM  Result Value Ref Range Status   Specimen Description BLOOD  RIGHT HAND   Final   Special Requests   Final    BOTTLES DRAWN AEROBIC AND ANAEROBIC  ANA 8ML AER 8ML   Culture NO GROWTH 5 DAYS  Final   Report Status 09/05/2016 FINAL  Final       Today   Subjective:   [name] today has no headache,no chest abdominal pain,no sob/ no new weakness tingling or numbness, feels much better wants to go home toda  Objective:   Blood pressure (!) 169/77, pulse 72, temperature 97.6 F (36.4 C), temperature source Oral, resp. rate 20, height 5\' 7"  (1.702 m), weight 136.1 kg (300 lb), SpO2 98 %.  No intake or output data in the 24 hours ending 09/07/16 1958  Exam Awake Alert, Oriented x 3, No new F.N deficits, Normal affect [place].AT,PERRAL Supple Neck,No JVD, No cervical lymphadenopathy appriciated.  Symmetrical Chest wall movement, Good air movement bilaterally, CTAB RRR,No Gallops,Rubs or new Murmurs, No Parasternal Heave +ve B.Sounds, Abd Soft, Non tender, No organomegaly appriciated, No rebound -guarding or rigidity. No Cyanosis, Clubbing or edema, No new Rash or bruise  Data Review   CBC w Diff:  Lab Results  Component Value Date   WBC 7.0 08/31/2016   HGB 10.3 (L) 08/31/2016   HGB 13.8 04/03/2015   HCT 28.6 (L) 08/31/2016   HCT 41.2 04/03/2015   PLT 203 08/31/2016   PLT 205 04/03/2015   LYMPHOPCT 21 01/01/2016   LYMPHOPCT 29.0 05/31/2014   MONOPCT 9 01/01/2016   MONOPCT 9.3 05/31/2014   EOSPCT 2 01/01/2016   EOSPCT 3.9 05/31/2014   BASOPCT 0 01/01/2016   BASOPCT 0.7 05/31/2014    CMP:  Lab Results  Component Value Date   NA 133 (L) 09/04/2016   NA 137 12/23/2015   NA 125 (L) 04/03/2015   K 4.8 09/04/2016   K 3.6 04/03/2015   CL 104 09/04/2016    CL 95 (L) 04/03/2015   CO2  26 09/04/2016   CO2 25 04/03/2015   BUN 19 09/04/2016   BUN 15 12/23/2015   BUN 8 04/03/2015   CREATININE 1.97 (H) 09/04/2016   CREATININE 0.83 04/03/2015   PROT 6.4 (L) 08/30/2016   PROT 6.1 04/26/2016   PROT 7.2 04/03/2015   ALBUMIN 2.9 (L) 08/30/2016   ALBUMIN 3.3 (L) 04/26/2016   ALBUMIN 3.5 04/03/2015   BILITOT 0.5 08/30/2016   BILITOT <0.2 04/26/2016   BILITOT 0.9 04/03/2015   ALKPHOS 97 08/30/2016   ALKPHOS 104 04/03/2015   AST 33 08/30/2016   AST 23 04/03/2015   ALT 17 08/30/2016   ALT 17 04/03/2015  .   Total Time in preparing paper work, data evaluation and todays exam - 40 minutes  Giavonna Pflum M.D on 09/06/2016 at 7:58 PM    Note: This dictation was prepared with Dragon dictation along with smaller phrase technology. Any transcriptional errors that result from this process are unintentional.

## 2016-09-11 ENCOUNTER — Emergency Department: Payer: Medicare Other

## 2016-09-11 ENCOUNTER — Inpatient Hospital Stay
Admission: EM | Admit: 2016-09-11 | Discharge: 2016-09-13 | DRG: 291 | Disposition: A | Discharge status: Home / Self Care | Payer: Medicare Other | Attending: Internal Medicine | Admitting: Internal Medicine

## 2016-09-11 ENCOUNTER — Other Ambulatory Visit: Payer: Self-pay

## 2016-09-11 ENCOUNTER — Inpatient Hospital Stay
Admit: 2016-09-11 | Discharge: 2016-09-11 | Disposition: A | Discharge status: Home / Self Care | Payer: Medicare Other | Attending: Internal Medicine | Admitting: Internal Medicine

## 2016-09-11 ENCOUNTER — Encounter: Payer: Self-pay | Admitting: Emergency Medicine

## 2016-09-11 DIAGNOSIS — Z79899 Other long term (current) drug therapy: Secondary | ICD-10-CM

## 2016-09-11 DIAGNOSIS — I110 Hypertensive heart disease with heart failure: Principal | ICD-10-CM | POA: Diagnosis present

## 2016-09-11 DIAGNOSIS — F1020 Alcohol dependence, uncomplicated: Secondary | ICD-10-CM | POA: Diagnosis present

## 2016-09-11 DIAGNOSIS — G4733 Obstructive sleep apnea (adult) (pediatric): Secondary | ICD-10-CM | POA: Diagnosis present

## 2016-09-11 DIAGNOSIS — E039 Hypothyroidism, unspecified: Secondary | ICD-10-CM | POA: Diagnosis present

## 2016-09-11 DIAGNOSIS — Z87891 Personal history of nicotine dependence: Secondary | ICD-10-CM

## 2016-09-11 DIAGNOSIS — I5023 Acute on chronic systolic (congestive) heart failure: Secondary | ICD-10-CM | POA: Diagnosis present

## 2016-09-11 DIAGNOSIS — Z955 Presence of coronary angioplasty implant and graft: Secondary | ICD-10-CM

## 2016-09-11 DIAGNOSIS — I509 Heart failure, unspecified: Secondary | ICD-10-CM

## 2016-09-11 DIAGNOSIS — Z8546 Personal history of malignant neoplasm of prostate: Secondary | ICD-10-CM | POA: Diagnosis not present

## 2016-09-11 DIAGNOSIS — I739 Peripheral vascular disease, unspecified: Secondary | ICD-10-CM | POA: Diagnosis present

## 2016-09-11 DIAGNOSIS — Z7982 Long term (current) use of aspirin: Secondary | ICD-10-CM | POA: Diagnosis not present

## 2016-09-11 DIAGNOSIS — M1990 Unspecified osteoarthritis, unspecified site: Secondary | ICD-10-CM | POA: Diagnosis present

## 2016-09-11 DIAGNOSIS — F319 Bipolar disorder, unspecified: Secondary | ICD-10-CM | POA: Diagnosis present

## 2016-09-11 DIAGNOSIS — E785 Hyperlipidemia, unspecified: Secondary | ICD-10-CM | POA: Diagnosis present

## 2016-09-11 DIAGNOSIS — Z8249 Family history of ischemic heart disease and other diseases of the circulatory system: Secondary | ICD-10-CM | POA: Diagnosis not present

## 2016-09-11 DIAGNOSIS — Z7951 Long term (current) use of inhaled steroids: Secondary | ICD-10-CM

## 2016-09-11 DIAGNOSIS — J449 Chronic obstructive pulmonary disease, unspecified: Secondary | ICD-10-CM | POA: Diagnosis present

## 2016-09-11 DIAGNOSIS — Z96649 Presence of unspecified artificial hip joint: Secondary | ICD-10-CM | POA: Diagnosis present

## 2016-09-11 DIAGNOSIS — I2510 Atherosclerotic heart disease of native coronary artery without angina pectoris: Secondary | ICD-10-CM | POA: Diagnosis present

## 2016-09-11 DIAGNOSIS — J9690 Respiratory failure, unspecified, unspecified whether with hypoxia or hypercapnia: Secondary | ICD-10-CM | POA: Diagnosis present

## 2016-09-11 LAB — CBC WITH DIFFERENTIAL/PLATELET
BASOS ABS: 0 10*3/uL (ref 0–0.1)
BASOS PCT: 0 %
EOS ABS: 0.1 10*3/uL (ref 0–0.7)
Eosinophils Relative: 1 %
HEMATOCRIT: 34.4 % — AB (ref 40.0–52.0)
HEMOGLOBIN: 11.7 g/dL — AB (ref 13.0–18.0)
Lymphocytes Relative: 17 %
Lymphs Abs: 1.8 10*3/uL (ref 1.0–3.6)
MCH: 30.1 pg (ref 26.0–34.0)
MCHC: 34 g/dL (ref 32.0–36.0)
MCV: 88.3 fL (ref 80.0–100.0)
MONOS PCT: 10 %
Monocytes Absolute: 1 10*3/uL (ref 0.2–1.0)
NEUTROS ABS: 7.7 10*3/uL — AB (ref 1.4–6.5)
NEUTROS PCT: 72 %
Platelets: 296 10*3/uL (ref 150–440)
RBC: 3.89 MIL/uL — AB (ref 4.40–5.90)
RDW: 14.6 % — ABNORMAL HIGH (ref 11.5–14.5)
WBC: 10.7 10*3/uL — AB (ref 3.8–10.6)

## 2016-09-11 LAB — BASIC METABOLIC PANEL
ANION GAP: 5 (ref 5–15)
BUN: 31 mg/dL — ABNORMAL HIGH (ref 6–20)
CHLORIDE: 103 mmol/L (ref 101–111)
CO2: 24 mmol/L (ref 22–32)
Calcium: 8.2 mg/dL — ABNORMAL LOW (ref 8.9–10.3)
Creatinine, Ser: 1.78 mg/dL — ABNORMAL HIGH (ref 0.61–1.24)
GFR calc non Af Amer: 35 mL/min — ABNORMAL LOW (ref >60)
GFR, EST AFRICAN AMERICAN: 40 mL/min — AB (ref >60)
Glucose, Bld: 102 mg/dL — ABNORMAL HIGH (ref 65–99)
POTASSIUM: 4 mmol/L (ref 3.5–5.1)
SODIUM: 132 mmol/L — AB (ref 135–145)

## 2016-09-11 LAB — TROPONIN I
TROPONIN I: 0.05 ng/mL — AB (ref <0.03)
TROPONIN I: 0.04 ng/mL — AB (ref <0.03)
TROPONIN I: 0.05 ng/mL — AB (ref <0.03)
Troponin I: 0.05 ng/mL (ref <0.03)

## 2016-09-11 LAB — TSH: TSH: 2.854 u[IU]/mL (ref 0.350–4.500)

## 2016-09-11 MED ORDER — LEVOTHYROXINE SODIUM 50 MCG PO TABS
50.0000 ug | ORAL_TABLET | ORAL | Status: DC
  Administered 2016-09-11 – 2016-09-13 (×3): 50 ug via ORAL
  Filled 2016-09-11 (×3): qty 1

## 2016-09-11 MED ORDER — ONDANSETRON HCL 4 MG/2ML IJ SOLN: 4.0000 mg | Freq: Four times a day (QID) | INTRAMUSCULAR | Status: DC | PRN

## 2016-09-11 MED ORDER — ALBUTEROL SULFATE (2.5 MG/3ML) 0.083% IN NEBU: 2.5000 mg | INHALATION_SOLUTION | Freq: Four times a day (QID) | RESPIRATORY_TRACT | Status: DC | PRN

## 2016-09-11 MED ORDER — MAGNESIUM OXIDE 400 (241.3 MG) MG PO TABS
400.0000 mg | ORAL_TABLET | Freq: Every day | ORAL | Status: DC
  Administered 2016-09-11 – 2016-09-13 (×3): 400 mg via ORAL
  Filled 2016-09-11 (×3): qty 1

## 2016-09-11 MED ORDER — MORPHINE SULFATE (PF) 2 MG/ML IV SOLN: 1.0000 mg | INTRAVENOUS | Status: DC | PRN

## 2016-09-11 MED ORDER — IPRATROPIUM-ALBUTEROL 0.5-2.5 (3) MG/3ML IN SOLN
9.0000 mL | Freq: Once | RESPIRATORY_TRACT | Status: AC
  Administered 2016-09-11: 9 mL via RESPIRATORY_TRACT
  Filled 2016-09-11: qty 9

## 2016-09-11 MED ORDER — SODIUM CHLORIDE 0.9 % IV SOLN: 250.0000 mL | INTRAVENOUS | Status: DC | PRN

## 2016-09-11 MED ORDER — METHYLPREDNISOLONE SODIUM SUCC 125 MG IJ SOLR
125.0000 mg | Freq: Once | INTRAMUSCULAR | Status: AC
  Administered 2016-09-11: 125 mg via INTRAVENOUS
  Filled 2016-09-11: qty 2

## 2016-09-11 MED ORDER — ENOXAPARIN SODIUM 40 MG/0.4ML ~~LOC~~ SOLN
40.0000 mg | Freq: Two times a day (BID) | SUBCUTANEOUS | Status: DC
  Administered 2016-09-11 – 2016-09-13 (×4): 40 mg via SUBCUTANEOUS
  Filled 2016-09-11 (×4): qty 0.4

## 2016-09-11 MED ORDER — ACETAMINOPHEN 325 MG PO TABS: 650.0000 mg | ORAL_TABLET | Freq: Four times a day (QID) | ORAL | Status: DC | PRN

## 2016-09-11 MED ORDER — OXYCODONE-ACETAMINOPHEN 5-325 MG PO TABS: 1.0000 | ORAL_TABLET | Freq: Four times a day (QID) | ORAL | Status: DC | PRN

## 2016-09-11 MED ORDER — TIOTROPIUM BROMIDE MONOHYDRATE 18 MCG IN CAPS
18.0000 ug | ORAL_CAPSULE | Freq: Every day | RESPIRATORY_TRACT | Status: DC
  Administered 2016-09-11 – 2016-09-13 (×3): 18 ug via RESPIRATORY_TRACT
  Filled 2016-09-11: qty 5

## 2016-09-11 MED ORDER — SODIUM CHLORIDE 0.9% FLUSH: 3.0000 mL | INTRAVENOUS | Status: DC | PRN

## 2016-09-11 MED ORDER — ATORVASTATIN CALCIUM 20 MG PO TABS
40.0000 mg | ORAL_TABLET | Freq: Every day | ORAL | Status: DC
  Administered 2016-09-11 – 2016-09-13 (×3): 40 mg via ORAL
  Filled 2016-09-11 (×3): qty 2

## 2016-09-11 MED ORDER — NITROGLYCERIN 0.4 MG SL SUBL
0.4000 mg | SUBLINGUAL_TABLET | SUBLINGUAL | Status: DC | PRN
  Administered 2016-09-11 (×2): 0.4 mg via SUBLINGUAL
  Filled 2016-09-11: qty 1

## 2016-09-11 MED ORDER — FUROSEMIDE 10 MG/ML IJ SOLN
40.0000 mg | Freq: Two times a day (BID) | INTRAMUSCULAR | Status: DC
  Administered 2016-09-11 – 2016-09-13 (×4): 40 mg via INTRAVENOUS
  Filled 2016-09-11 (×4): qty 4

## 2016-09-11 MED ORDER — ALBUTEROL SULFATE HFA 108 (90 BASE) MCG/ACT IN AERS: 2.0000 | INHALATION_SPRAY | Freq: Four times a day (QID) | RESPIRATORY_TRACT | Status: DC | PRN

## 2016-09-11 MED ORDER — ENOXAPARIN SODIUM 40 MG/0.4ML ~~LOC~~ SOLN: 40.0000 mg | SUBCUTANEOUS | Status: DC

## 2016-09-11 MED ORDER — MOMETASONE FURO-FORMOTEROL FUM 200-5 MCG/ACT IN AERO
2.0000 | INHALATION_SPRAY | Freq: Two times a day (BID) | RESPIRATORY_TRACT | Status: DC
  Administered 2016-09-11 – 2016-09-12 (×3): 2 via RESPIRATORY_TRACT
  Filled 2016-09-11: qty 8.8

## 2016-09-11 MED ORDER — LIVING BETTER WITH HEART FAILURE BOOK
Freq: Once | Status: AC
  Administered 2016-09-11: 08:00:00

## 2016-09-11 MED ORDER — ASPIRIN EC 81 MG PO TBEC
81.0000 mg | DELAYED_RELEASE_TABLET | ORAL | Status: DC
  Administered 2016-09-12 – 2016-09-13 (×2): 81 mg via ORAL
  Filled 2016-09-11 (×2): qty 1

## 2016-09-11 MED ORDER — METOPROLOL TARTRATE 50 MG PO TABS
50.0000 mg | ORAL_TABLET | Freq: Two times a day (BID) | ORAL | Status: DC
  Administered 2016-09-11 – 2016-09-13 (×5): 50 mg via ORAL
  Filled 2016-09-11 (×5): qty 1

## 2016-09-11 MED ORDER — FAMOTIDINE 20 MG PO TABS
20.0000 mg | ORAL_TABLET | Freq: Every day | ORAL | Status: DC
  Administered 2016-09-11 – 2016-09-13 (×3): 20 mg via ORAL
  Filled 2016-09-11 (×3): qty 1

## 2016-09-11 MED ORDER — ALBUTEROL SULFATE (2.5 MG/3ML) 0.083% IN NEBU
5.0000 mg | INHALATION_SOLUTION | Freq: Once | RESPIRATORY_TRACT | Status: AC
  Administered 2016-09-11: 5 mg via RESPIRATORY_TRACT
  Filled 2016-09-11: qty 6

## 2016-09-11 MED ORDER — FUROSEMIDE 10 MG/ML IJ SOLN
40.0000 mg | Freq: Once | INTRAMUSCULAR | Status: AC
  Administered 2016-09-11: 40 mg via INTRAVENOUS
  Filled 2016-09-11: qty 4

## 2016-09-11 MED ORDER — LOSARTAN POTASSIUM 50 MG PO TABS
100.0000 mg | ORAL_TABLET | Freq: Every day | ORAL | Status: DC
  Administered 2016-09-11 – 2016-09-12 (×2): 100 mg via ORAL
  Filled 2016-09-11 (×2): qty 2

## 2016-09-11 MED ORDER — HYDRALAZINE HCL 50 MG PO TABS
25.0000 mg | ORAL_TABLET | Freq: Three times a day (TID) | ORAL | Status: DC
  Administered 2016-09-11 – 2016-09-13 (×7): 25 mg via ORAL
  Filled 2016-09-11 (×7): qty 1

## 2016-09-11 MED ORDER — PHENTERMINE HCL 30 MG PO CAPS: 30.0000 mg | ORAL_CAPSULE | ORAL | Status: DC

## 2016-09-11 MED ORDER — DOCUSATE SODIUM 50 MG/5ML PO LIQD
50.0000 mg | Freq: Two times a day (BID) | ORAL | Status: DC
  Administered 2016-09-11 – 2016-09-13 (×5): 50 mg via ORAL
  Filled 2016-09-11 (×6): qty 10

## 2016-09-11 MED ORDER — HYDROCODONE-ACETAMINOPHEN 5-325 MG PO TABS: 1.0000 | ORAL_TABLET | Freq: Four times a day (QID) | ORAL | Status: DC | PRN

## 2016-09-11 MED ORDER — SODIUM CHLORIDE 0.9% FLUSH
3.0000 mL | Freq: Two times a day (BID) | INTRAVENOUS | Status: DC
  Administered 2016-09-11 – 2016-09-13 (×5): 3 mL via INTRAVENOUS

## 2016-09-11 MED ORDER — ONDANSETRON HCL 4 MG PO TABS: 4.0000 mg | ORAL_TABLET | Freq: Four times a day (QID) | ORAL | Status: DC | PRN

## 2016-09-11 MED ORDER — ACETAMINOPHEN 650 MG RE SUPP: 650.0000 mg | Freq: Four times a day (QID) | RECTAL | Status: DC | PRN

## 2016-09-11 NOTE — Progress Notes (Signed)
Patient c/o pressure chest pain 7/10 nitro x 2 given , md informed ,stated is 2/10 now will continue to monitor

## 2016-09-11 NOTE — ED Provider Notes (Signed)
[facility] Emergency Department Provider Note  ____________________________________________   First MD Initiated Contact with Patient 09/11/16 0700     (approximate)  I have reviewed the triage vital signs and the nursing notes.   HISTORY  Chief Complaint Shortness of Breath   HPI [name] is a 81 y.o. male with a history of COPD as well as bipolar affective disorder who is presenting to the emergency department today with shortness of breath. He says the shortness of breath has been increasing over the past week ever since he was admitted to the hospital. He says that he has had a runny nose and cough. Also has not worn his CPAP last night because he said he was too short of breath. It on. Denies missing any doses medications. Denies any pain. Also that he has increased bilateral swelling to his legs which she says started while he was in inpatient in the hospital.   Past Medical History:  Diagnosis Date  . Alcoholism ([place])   . Asthma   . Bipolar affective disorder ([place])   . COPD (chronic obstructive pulmonary disease) (HCC)    COPD  . Coronary artery disease February 2005   PCI and Taxus drug-eluting stent placement to the distal RCA (3.5 x 12 mm)  . Degenerative joint disease   . Essential hypertension   . Hyperlipidemia   . Hypertension   . Prostate CA [facility])    prostate ca dx 34 ys ago;  . Prostate CA [facility])    prostate ca dx 20 yrs ago  . PVD (peripheral vascular disease) ([place])   . Tobacco abuse     Patient Active Problem List   Diagnosis Date Noted  . Hyponatremia 08/31/2016  . UTI (lower urinary tract infection) 08/31/2016  . Exertional dyspnea 02/11/2016  . Hyperlipidemia 12/03/2015  . Essential hypertension   . Coronary artery disease 01/19/2004    Past Surgical History:  Procedure Laterality Date  . CARDIAC CATHETERIZATION    . CORONARY ANGIOPLASTY WITH STENT PLACEMENT    . TOTAL HIP ARTHROPLASTY     right    Prior  to Admission medications   Medication Sig Start Date End Date Taking? Authorizing Provider  albuterol (PROAIR HFA) 108 (90 Base) MCG/ACT inhaler Inhale 2 puffs into the lungs every 6 (six) hours as needed. Reported on 04/21/2016 01/21/16 01/20/17  Historical Provider, MD  aspirin 81 MG tablet Take 81 mg by mouth daily.  11/18/12   Historical Provider, MD  atorvastatin (LIPITOR) 40 MG tablet Take 1 tablet (40 mg total) by mouth daily. Patient not taking: Reported on 08/31/2016 03/06/16   [name], MD  cephALEXin (KEFLEX) 500 MG capsule Take 1 capsule (500 mg total) by mouth every 12 (twelve) hours. 09/06/16   Epifanio Lesches, MD  docusate sodium (COLACE) 50 MG capsule Take 50 mg by mouth 2 (two) times daily.    Historical Provider, MD  famotidine (PEPCID) 20 MG tablet Take 1 tablet (20 mg total) by mouth daily. 09/07/16   Epifanio Lesches, MD  fluticasone-salmeterol (ADVAIR HFA) 628-31 MCG/ACT inhaler Inhale 2 puffs into the lungs 2 (two) times daily.    Historical Provider, MD  hydrALAZINE (APRESOLINE) 25 MG tablet Take 1 tablet (25 mg total) by mouth every 8 (eight) hours. 09/06/16   Epifanio Lesches, MD  HYDROcodone-acetaminophen (NORCO/VICODIN) 5-325 MG tablet Take 1-2 tablets by mouth every 6 (six) hours as needed for moderate pain. Patient not taking: Reported on 08/31/2016 01/29/16   Suanne Marker L  Summers, PA-C  levothyroxine (SYNTHROID, LEVOTHROID) 50 MCG tablet Take 50 mcg by mouth daily. 01/21/16   Historical Provider, MD  losartan (COZAAR) 100 MG tablet Take 1 tablet (100 mg total) by mouth daily. 04/20/16   [name], MD  magnesium oxide (MAG-OX) 400 MG tablet Take 400 mg by mouth daily.    Historical Provider, MD  metoprolol (LOPRESSOR) 50 MG tablet Take 1 tablet (50 mg total) by mouth 2 (two) times daily. 09/06/16   Epifanio Lesches, MD  nitroGLYCERIN (NITROSTAT) 0.4 MG SL tablet Take 0.4 mg by mouth daily as needed. As needed for chest pain 12/06/15   Historical Provider, MD    ondansetron (ZOFRAN ODT) 4 MG disintegrating tablet Take 1 tablet (4 mg total) by mouth every 6 (six) hours as needed for nausea or vomiting. Patient not taking: Reported on 08/31/2016 01/01/16   Delman Kitten, MD  phentermine 30 MG capsule Take 30 mg by mouth every morning.    Historical Provider, MD  predniSONE (STERAPRED UNI-PAK 21 TAB) 10 MG (21) TBPK tablet Take 1 tablet (10 mg total) by mouth daily. Take as directed,taper by 10 mg daily 09/06/16   Epifanio Lesches, MD  tiotropium (SPIRIVA) 18 MCG inhalation capsule Place 18 mcg into inhaler and inhale daily.    Historical Provider, MD    Allergies Amlodipine; Benazepril-hydrochlorothiazide; Chlorthalidone; and Other  Family History  Problem Relation Age of Onset  . Hypertension Mother   . Hyperlipidemia Mother   . Heart attack Mother   . Hypertension Father   . Hyperlipidemia Father   . Heart attack Father     Social History Social History  Substance Use Topics  . Smoking status: Former Smoker    Packs/day: 1.00    Years: 50.00    Types: Cigarettes  . Smokeless tobacco: Never Used  . Alcohol use Yes     Comment: social     Review of Systems Constitutional: No fever/chills Eyes: No visual changes. ENT: No sore throat. Cardiovascular: Denies chest pain. Respiratory: As above Gastrointestinal: No abdominal pain.  No nausea, no vomiting.  No diarrhea.  No constipation. Genitourinary: Negative for dysuria. Musculoskeletal: Negative for back pain. Skin: Negative for rash. Neurological: Negative for headaches, focal weakness or numbness.  10-point ROS otherwise negative.  ____________________________________________   PHYSICAL EXAM:  VITAL SIGNS: ED Triage Vitals  Enc Vitals Group     BP 09/11/16 0632 (!) 160/60     Pulse Rate 09/11/16 0632 84     Resp 09/11/16 0632 (!) 23     Temp 09/11/16 0632 97.9 F (36.6 C)     Temp src --      SpO2 09/11/16 0632 95 %     Weight 09/11/16 0639 300 lb (136.1 kg)      Height 09/11/16 0639 5\' 7"  (1.702 m)     Head Circumference --      Peak Flow --      Pain Score --      Pain Loc --      Pain Edu? --      Excl. in [place]? --     Constitutional: Alert and oriented. Well appearing and in no acute distress. Eyes: Conjunctivae are normal. PERRL. EOMI. Head: Atraumatic. Nose: No congestion/rhinnorhea. Mouth/Throat: Mucous membranes are moist.   Neck: No stridor.   Cardiovascular: Normal rate, regular rhythm. Grossly normal heart sounds.   Respiratory: Tachypneic with a prolonged expiratory phase as well as wheezing throughout. Speaks in full sentences. No retractions. Gastrointestinal: Soft  and nontender. No distention.  Musculoskeletal: Mild to moderate edema to the bilateral lower extremities. No erythema, induration or tenderness palpation. Neurologic:  Normal speech and language. No gross focal neurologic deficits are appreciated.  Skin:  Skin is warm, dry and intact. No rash noted. Psychiatric: Mood and affect are normal. Speech and behavior are normal.  ____________________________________________   LABS (all labs ordered are listed, but only abnormal results are displayed)  Labs Reviewed  CBC WITH DIFFERENTIAL/PLATELET  BASIC METABOLIC PANEL  TROPONIN I   ____________________________________________  EKG  ED ECG REPORT I, Peyton Spengler,  Youlanda Roys, the attending physician, personally viewed and interpreted this ECG.   Date: 09/11/2016  EKG Time: 0634  Rate: 90  Rhythm: sinus arrythmia  Axis: normal axis  Intervals:none  ST&T Change: no st elevation or depression.  No abnormal t wave inversion.    ____________________________________________  Adrian Prows Chest 2 View (Accession [id number]) (Order [id number])  Imaging  Date: 09/11/2016 Department: [place] Released By: Alm Bustard, RN (auto-released) Authorizing: Orbie Pyo, MD  PACS Images   Show images for DG Chest 2 View    Study Result   CLINICAL DATA:  Shortness of breath.  EXAM: CHEST  2 VIEW  COMPARISON:  02/20/2015 and 05/13/2012  FINDINGS: There is new bilateral interstitial pulmonary edema small bilateral pleural effusions, right more than left.  Heart size is normal. Aortic atherosclerosis. No acute bone abnormality. Chronic accentuation of the upper thoracic kyphosis.  IMPRESSION: New interstitial pulmonary edema and bilateral pleural effusions.  Aortic atherosclerosis.   Electronically Signed   By: Lorriane Shire M.D.   On: 09/11/2016 07:51    ____________________________________________   PROCEDURES  Procedure(s) performed:   Procedures  Critical Care performed:   ____________________________________________   INITIAL IMPRESSION / ASSESSMENT AND PLAN / ED COURSE  Pertinent labs & imaging results that were available during my care of the patient were reviewed by me and considered in my medical decision making (see chart for details).  ----------------------------------------- 8:28 AM on 09/11/2016 -----------------------------------------  Patient with minimal improvement after nebs but found to have new onset CHF. Lasix has been ordered. Patient will be admitted to the hospital for treatment of new onset CHF. Signed out to Dr. Posey Pronto the medicine service. I discussed the plan as well as the diagnosis with the patient and he is understanding and willing to comply.  Clinical Course     ____________________________________________   FINAL CLINICAL IMPRESSION(S) / ED DIAGNOSES  New onset CHF.    NEW MEDICATIONS STARTED DURING THIS VISIT:  New Prescriptions   No medications on file     Note:  This document was prepared using Dragon voice recognition software and may include unintentional dictation errors.    Orbie Pyo, MD 09/11/16 [phone]

## 2016-09-11 NOTE — Progress Notes (Signed)
[facility]  Patient Status: Active  [facility] is providing the following services: PT  If patient discharges after hours, please call [phone].   [name] 09/11/2016, 2:55 PM

## 2016-09-11 NOTE — Progress Notes (Signed)
Pt has a crcl of 45.57ml/min and a BMI >40. Therefore lovenox will the transitioned to 40mg  BID per protocol.  Ramond Dial, Pharm.D Clinical Pharmacist

## 2016-09-11 NOTE — Discharge Instructions (Signed)
Heart Failure Clinic appointment on October 03, 2016 at 11:30am with Darylene Price, [place]. Please call [phone] to reschedule.

## 2016-09-11 NOTE — Care Management (Signed)
Patient admitted from home wth what appears to be new onset of CHF.  he has chronic home cpap.  Forgot to put it on last night and awoke short of breath.  He is currently open to [place] physical therapy.  If able to discharge home, would benefit having home health nurse added to referral. Referral to [facility]

## 2016-09-11 NOTE — H&P (Signed)
[facility] [place] NAME: [name]    MR#:  [id number]  DATE OF BIRTH:  1935/01/15  DATE OF ADMISSION:  09/11/2016  PRIMARY CARE PHYSICIAN: Marguerita Merles, MD   REQUESTING/REFERRING PHYSICIAN:  Tresa Endo MD  CHIEF COMPLAINT:   Chief Complaint  Patient presents with  . Shortness of Breath    HISTORY OF PRESENT ILLNESS: [name]  is a 81 y.o. male with a known history of  COPD, alcoholism, asthma, bipolar affective disorder, obstructive sleep apnea who was recently hospitalized for nausea vomiting and urinary tract infection. Patient returns today complaining of shortness of breath since yesterday. He is states that it got progressively more short of breath as the night progressed. He was unable to lay flat and unable to use his CPAP machine. Patient also reports that he's had significant swelling over his lower extremity.  PAST MEDICAL HISTORY:   Past Medical History:  Diagnosis Date  . Alcoholism ([place])   . Asthma   . Bipolar affective disorder ([place])   . COPD (chronic obstructive pulmonary disease) (HCC)    COPD  . Coronary artery disease February 2005   PCI and Taxus drug-eluting stent placement to the distal RCA (3.5 x 12 mm)  . Degenerative joint disease   . Essential hypertension   . Hyperlipidemia   . Hypertension   . Prostate CA [facility])    prostate ca dx 65 ys ago;  . Prostate CA [facility])    prostate ca dx 20 yrs ago  . PVD (peripheral vascular disease) ([place])   . Tobacco abuse     PAST SURGICAL HISTORY: Past Surgical History:  Procedure Laterality Date  . CARDIAC CATHETERIZATION    . CORONARY ANGIOPLASTY WITH STENT PLACEMENT    . TOTAL HIP ARTHROPLASTY     right    SOCIAL HISTORY:  Social History  Substance Use Topics  . Smoking status: Former Smoker    Packs/day: 1.00    Years: 50.00    Types: Cigarettes  . Smokeless tobacco: Never Used  . Alcohol use Yes     Comment: social     FAMILY HISTORY:   Family History  Problem Relation Age of Onset  . Hypertension Mother   . Hyperlipidemia Mother   . Heart attack Mother   . Hypertension Father   . Hyperlipidemia Father   . Heart attack Father     DRUG ALLERGIES:  Allergies  Allergen Reactions  . Amlodipine     Constipation   . Benazepril-Hydrochlorothiazide     Constipation   . Chlorthalidone     Hyponatremia  . Other Other (See Comments)    "ANY BLOOD PRESSURE MEDICATIONS THAT I'VE TRIED" - PT. DOES NOT REMEMBER WHICH ONES    REVIEW OF SYSTEMS:   CONSTITUTIONAL: No fever, Positive fatigue and weakness.  EYES: No blurred or double vision.  EARS, NOSE, AND THROAT: No tinnitus or ear pain.  RESPIRATORY: Positive cough, positive shortness of breath, no wheezing or hemoptysis.  CARDIOVASCULAR: No chest pain, orthopnea, edema.  GASTROINTESTINAL: No nausea, vomiting, diarrhea or abdominal pain.  GENITOURINARY: No dysuria, hematuria.  ENDOCRINE: No polyuria, nocturia,  HEMATOLOGY: No anemia, easy bruising or bleeding SKIN: No rash or lesion. Swelling MUSCULOSKELETAL: No joint pain or arthritis.   NEUROLOGIC: No tingling, numbness, weakness.  PSYCHIATRY: No anxiety or depression.   MEDICATIONS AT HOME:  Prior to Admission medications   Medication Sig Start Date End Date Taking? Authorizing Provider  albuterol (PROAIR  HFA) 108 (90 Base) MCG/ACT inhaler Inhale 2 puffs into the lungs every 6 (six) hours as needed for wheezing or shortness of breath. Reported on 04/21/2016 01/21/16 01/20/17 Yes Historical Provider, MD  aspirin EC 81 MG tablet Take 81 mg by mouth every morning.   Yes Historical Provider, MD  cephALEXin (KEFLEX) 500 MG capsule Take 1 capsule (500 mg total) by mouth every 12 (twelve) hours. 09/06/16  Yes Epifanio Lesches, MD  docusate sodium (COLACE) 50 MG capsule Take 50 mg by mouth 2 (two) times daily.   Yes Historical Provider, MD  famotidine (PEPCID) 20 MG tablet Take 1 tablet (20 mg total) by mouth daily. 09/07/16   Yes Epifanio Lesches, MD  fluticasone-salmeterol (ADVAIR HFA) 102-72 MCG/ACT inhaler Inhale 2 puffs into the lungs 2 (two) times daily.   Yes Historical Provider, MD  hydrALAZINE (APRESOLINE) 25 MG tablet Take 1 tablet (25 mg total) by mouth every 8 (eight) hours. 09/06/16  Yes Epifanio Lesches, MD  levothyroxine (SYNTHROID, LEVOTHROID) 50 MCG tablet Take 50 mcg by mouth every morning.  01/21/16  Yes Historical Provider, MD  losartan (COZAAR) 100 MG tablet Take 1 tablet (100 mg total) by mouth daily. 04/20/16  Yes [name], MD  magnesium oxide (MAG-OX) 400 MG tablet Take 400 mg by mouth daily.   Yes Historical Provider, MD  metoprolol (LOPRESSOR) 50 MG tablet Take 1 tablet (50 mg total) by mouth 2 (two) times daily. 09/06/16  Yes Epifanio Lesches, MD  nitroGLYCERIN (NITROSTAT) 0.4 MG SL tablet Take 0.4 mg by mouth every 5 (five) minutes x 3 doses as needed for chest pain. As needed for chest pain 12/06/15  Yes Historical Provider, MD  phentermine 30 MG capsule Take 30 mg by mouth every morning.   Yes Historical Provider, MD  predniSONE (STERAPRED UNI-PAK 21 TAB) 10 MG (21) TBPK tablet Take 1 tablet (10 mg total) by mouth daily. Take as directed,taper by 10 mg daily 09/06/16  Yes Epifanio Lesches, MD  tiotropium (SPIRIVA) 18 MCG inhalation capsule Place 18 mcg into inhaler and inhale daily.   Yes Historical Provider, MD  atorvastatin (LIPITOR) 40 MG tablet Take 1 tablet (40 mg total) by mouth daily. Patient not taking: Reported on 09/11/2016 03/06/16   [name], MD  HYDROcodone-acetaminophen (NORCO/VICODIN) 5-325 MG tablet Take 1-2 tablets by mouth every 6 (six) hours as needed for moderate pain. Patient not taking: Reported on 08/31/2016 01/29/16   Johnn Hai, PA-C  ondansetron (ZOFRAN ODT) 4 MG disintegrating tablet Take 1 tablet (4 mg total) by mouth every 6 (six) hours as needed for nausea or vomiting. Patient not taking: Reported on 09/11/2016 01/01/16   Delman Kitten, MD       PHYSICAL EXAMINATION:   VITAL SIGNS: Blood pressure (!) 190/75, pulse 82, temperature 97.6 F (36.4 C), temperature source Oral, resp. rate 17, height 5\' 7"  (1.702 m), weight (!) 305 lb 6.4 oz (138.5 kg), SpO2 97 %.  GENERAL:  81 y.o.-year-old patient lying in the bed with no acute distress.  EYES: Pupils equal, round, reactive to light and accommodation. No scleral icterus. Extraocular muscles intact.  HEENT: Head atraumatic, normocephalic. Oropharynx and nasopharynx clear.  NECK:  Supple, no jugular venous distention. No thyroid enlargement, no tenderness.  LUNGS: Bilateral crackles at the basis no wheezing or rhonchi CARDIOVASCULAR: S1, S2 normal. No murmurs, rubs, or gallops.  ABDOMEN: Soft, nontender, nondistended. Bowel sounds present. No organomegaly or mass.  EXTREMITIES: 2+ pedal edema, cyanosis, or clubbing.  NEUROLOGIC: Cranial nerves  II through XII are intact. Muscle strength 5/5 in all extremities. Sensation intact. Gait not checked.  PSYCHIATRIC: The patient is alert and oriented x 3.  SKIN: No obvious rash, lesion, or ulcer.   LABORATORY PANEL:   CBC  Recent Labs Lab 09/11/16 0636  WBC 10.7*  HGB 11.7*  HCT 34.4*  PLT 296  MCV 88.3  MCH 30.1  MCHC 34.0  RDW 14.6*  LYMPHSABS 1.8  MONOABS 1.0  EOSABS 0.1  BASOSABS 0.0   ------------------------------------------------------------------------------------------------------------------  Chemistries   Recent Labs Lab 09/11/16 0636  NA 132*  K 4.0  CL 103  CO2 24  GLUCOSE 102*  BUN 31*  CREATININE 1.78*  CALCIUM 8.2*   ------------------------------------------------------------------------------------------------------------------ estimated creatinine clearance is 45.3 mL/min (by C-G formula based on SCr of 1.78 mg/dL (H)). ------------------------------------------------------------------------------------------------------------------  Recent Labs  09/11/16 0954  TSH 2.854     Coagulation  profile No results for input(s): INR, PROTIME in the last 168 hours. ------------------------------------------------------------------------------------------------------------------- No results for input(s): DDIMER in the last 72 hours. -------------------------------------------------------------------------------------------------------------------  Cardiac Enzymes  Recent Labs Lab 09/11/16 0636 09/11/16 0954  TROPONINI 0.05* 0.04*   ------------------------------------------------------------------------------------------------------------------ Invalid input(s): POCBNP  ---------------------------------------------------------------------------------------------------------------  Urinalysis    Component Value Date/Time   COLORURINE AMBER (A) 08/30/2016 2249   APPEARANCEUR TURBID (A) 08/30/2016 2249   APPEARANCEUR Clear 04/03/2015 1216   LABSPEC 1.003 (L) 08/30/2016 2249   LABSPEC 1.004 04/03/2015 1216   PHURINE 7.0 08/30/2016 2249   GLUCOSEU NEGATIVE 08/30/2016 2249   GLUCOSEU Negative 04/03/2015 1216   HGBUR 2+ (A) 08/30/2016 2249   BILIRUBINUR NEGATIVE 08/30/2016 2249   BILIRUBINUR Negative 04/03/2015 1216   KETONESUR NEGATIVE 08/30/2016 2249   PROTEINUR 100 (A) 08/30/2016 2249   UROBILINOGEN 0.2 03/27/2011 0845   NITRITE POSITIVE (A) 08/30/2016 2249   LEUKOCYTESUR 3+ (A) 08/30/2016 2249   LEUKOCYTESUR Negative 04/03/2015 1216     RADIOLOGY: Dg Chest 2 View  Result Date: 09/11/2016 CLINICAL DATA:  Shortness of breath. EXAM: CHEST  2 VIEW COMPARISON:  02/20/2015 and 05/13/2012 FINDINGS: There is new bilateral interstitial pulmonary edema small bilateral pleural effusions, right more than left. Heart size is normal. Aortic atherosclerosis. No acute bone abnormality. Chronic accentuation of the upper thoracic kyphosis. IMPRESSION: New interstitial pulmonary edema and bilateral pleural effusions. Aortic atherosclerosis. Electronically Signed   By: Lorriane Shire M.D.    On: 09/11/2016 07:51    EKG: Orders placed or performed during the hospital encounter of 09/11/16  . ED EKG  . ED EKG    IMPRESSION AND PLAN: Patient is a 81 year old African-American male recent admission for UTI nausea vomiting being admitted for shortness of breath  1. Acute CHF no history of CHF in the past We'll go ahead and treat him with IV Lasix Obtain echocardiogram of the heart Cardiology consult  2. Sleep apnea we'll continue CPAP at nighttime  3. COPD without any exacerbation We'll continue home inhalers as taking at home  4. Essential hypertension continue losartan and metoprolol  5. Hypothyroidism Continue levothyroxine  6. Lipidemia nonspecified continue Lipitor  7. Chest pressure Cardiac enzymes when necessary nitroglycerin and morphine  All the records are reviewed and case discussed with ED provider. Management plans discussed with the patient, family and they are in agreement.  CODE STATUS:    Code Status Orders        Start     Ordered   09/11/16 0845  Full code  Continuous     09/11/16 0846    Code Status History  Date Active Date Inactive Code Status Order ID Comments User Context   08/31/2016  3:33 AM 09/06/2016  5:14 PM Full Code [id number]  Saundra Shelling, MD Inpatient       TOTAL TIME TAKING CARE OF THIS PATIENT13minutes.    Dustin Flock M.D on 09/11/2016 at 1:45 PM  Between 7am to 6pm - Pager - [phone]  After 6pm go to [email or web address] - password [email or web address] [facility]  Office  [phone]  CC: Primary care physician; Marguerita Merles, MD

## 2016-09-11 NOTE — ED Triage Notes (Signed)
Per [facility] pt forgot to but his CPAP on before he fell asleep this evening. Per pt he woke up SOB and called 911. Pt's VS on scene were O2 95% RA and BP 210/60. Pt is alert and oriented with NAD noted at this time.

## 2016-09-11 NOTE — Progress Notes (Signed)
Initial Heart Failure Clinic appointment made on October 03, 2016 at 11:30am. Thank you.

## 2016-09-11 NOTE — Consult Note (Signed)
[facility] Cardiology  CARDIOLOGY CONSULT NOTE  Patient ID: [name] MRN: [id number] DOB/AGE: 79-Feb-1936 79 y.o.  Admit date: 09/11/2016 Referring Physician Posey Pronto Primary Physician [facility] Primary Cardiologist Reason for Consultation congestive heart failure  HPI: 81 year old gentleman referred for evaluation of congestive heart failure. The patient does have a history of COPD, obstructive sleep apnea and bipolar affective disorder. The patient was recently hospitalized for nausea and vomiting last week, and returns with a one day history of progressive shortness of breath. Admission labs were notable for borderline elevated troponin of 0.05, with follow-up of 0.04. Most recent 2-D echocardiogram 05/30/2014 revealed mildly reduced left ventricular function.  Review of systems complete and found to be negative unless listed above     Past Medical History:  Diagnosis Date  . Alcoholism ([place])   . Asthma   . Bipolar affective disorder ([place])   . COPD (chronic obstructive pulmonary disease) (HCC)    COPD  . Coronary artery disease February 2005   PCI and Taxus drug-eluting stent placement to the distal RCA (3.5 x 12 mm)  . Degenerative joint disease   . Essential hypertension   . Hyperlipidemia   . Hypertension   . Prostate CA [facility])    prostate ca dx 81 ys ago;  . Prostate CA [facility])    prostate ca dx 20 yrs ago  . PVD (peripheral vascular disease) ([place])   . Tobacco abuse     Past Surgical History:  Procedure Laterality Date  . CARDIAC CATHETERIZATION    . CORONARY ANGIOPLASTY WITH STENT PLACEMENT    . TOTAL HIP ARTHROPLASTY     right    Prescriptions Prior to Admission  Medication Sig Dispense Refill Last Dose  . albuterol (PROAIR HFA) 108 (90 Base) MCG/ACT inhaler Inhale 2 puffs into the lungs every 6 (six) hours as needed for wheezing or shortness of breath. Reported on 04/21/2016   09/10/2016 at Unknown time  . aspirin EC 81 MG tablet Take 81 mg by mouth every morning.   09/11/2016  at 0800  . cephALEXin (KEFLEX) 500 MG capsule Take 1 capsule (500 mg total) by mouth every 12 (twelve) hours. 10 capsule 0 09/10/2016 at Unknown time  . docusate sodium (COLACE) 50 MG capsule Take 50 mg by mouth 2 (two) times daily.   09/10/2016 at Unknown time  . famotidine (PEPCID) 20 MG tablet Take 1 tablet (20 mg total) by mouth daily. 30 tablet 0 09/10/2016 at Unknown time  . fluticasone-salmeterol (ADVAIR HFA) 115-21 MCG/ACT inhaler Inhale 2 puffs into the lungs 2 (two) times daily.   09/10/2016 at Unknown time  . hydrALAZINE (APRESOLINE) 25 MG tablet Take 1 tablet (25 mg total) by mouth every 8 (eight) hours. 60 tablet 0 09/10/2016 at Unknown time  . levothyroxine (SYNTHROID, LEVOTHROID) 50 MCG tablet Take 50 mcg by mouth every morning.    09/10/2016 at Unknown time  . losartan (COZAAR) 100 MG tablet Take 1 tablet (100 mg total) by mouth daily. 30 tablet 3 09/10/2016 at Unknown time  . magnesium oxide (MAG-OX) 400 MG tablet Take 400 mg by mouth daily.   09/10/2016 at Unknown time  . metoprolol (LOPRESSOR) 50 MG tablet Take 1 tablet (50 mg total) by mouth 2 (two) times daily. 60 tablet 0 09/10/2016 at 1600  . nitroGLYCERIN (NITROSTAT) 0.4 MG SL tablet Take 0.4 mg by mouth every 5 (five) minutes x 3 doses as needed for chest pain. As needed for chest pain   unknown  . phentermine 30 MG  capsule Take 30 mg by mouth every morning.   09/10/2016 at Unknown time  . predniSONE (STERAPRED UNI-PAK 21 TAB) 10 MG (21) TBPK tablet Take 1 tablet (10 mg total) by mouth daily. Take as directed,taper by 10 mg daily 21 tablet 0 09/10/2016 at Unknown time  . tiotropium (SPIRIVA) 18 MCG inhalation capsule Place 18 mcg into inhaler and inhale daily.   09/10/2016 at Unknown time  . atorvastatin (LIPITOR) 40 MG tablet Take 1 tablet (40 mg total) by mouth daily. (Patient not taking: Reported on 09/11/2016) 30 tablet 3 Not Taking at Unknown time  . HYDROcodone-acetaminophen (NORCO/VICODIN) 5-325 MG tablet Take 1-2 tablets by mouth  every 6 (six) hours as needed for moderate pain. (Patient not taking: Reported on 08/31/2016) 30 tablet 0 Not Taking at Unknown time  . ondansetron (ZOFRAN ODT) 4 MG disintegrating tablet Take 1 tablet (4 mg total) by mouth every 6 (six) hours as needed for nausea or vomiting. (Patient not taking: Reported on 09/11/2016) 20 tablet 0 Not Taking at Unknown time   Social History   Social History  . Marital status: Legally Separated    Spouse name: N/A  . Number of children: N/A  . Years of education: N/A   Occupational History  . retired    Social History Main Topics  . Smoking status: Former Smoker    Packs/day: 1.00    Years: 50.00    Types: Cigarettes  . Smokeless tobacco: Never Used  . Alcohol use Yes     Comment: social   . Drug use: No  . Sexual activity: Not on file   Other Topics Concern  . Not on file   Social History Narrative  . No narrative on file    Family History  Problem Relation Age of Onset  . Hypertension Mother   . Hyperlipidemia Mother   . Heart attack Mother   . Hypertension Father   . Hyperlipidemia Father   . Heart attack Father       Review of systems complete and found to be negative unless listed above      PHYSICAL EXAM  General: Well developed, well nourished, in no acute distress HEENT:  Normocephalic and atramatic Neck:  No JVD.  Lungs: Clear bilaterally to auscultation and percussion. Heart: HRRR . Normal S1 and S2 without gallops or murmurs.  Abdomen: Bowel sounds are positive, abdomen soft and non-tender  Msk:  Back normal, normal gait. Normal strength and tone for age. Extremities: No clubbing, cyanosis or edema.   Neuro: Alert and oriented X 3. Psych:  Good affect, responds appropriately  Labs:   Lab Results  Component Value Date   WBC 10.7 (H) 09/11/2016   HGB 11.7 (L) 09/11/2016   HCT 34.4 (L) 09/11/2016   MCV 88.3 09/11/2016   PLT 296 09/11/2016    Recent Labs Lab 09/11/16 0636  NA 132*  K 4.0  CL 103  CO2  24  BUN 31*  CREATININE 1.78*  CALCIUM 8.2*  GLUCOSE 102*   Lab Results  Component Value Date   CKTOTAL 351 (H) 05/30/2014   CKMB 2.3 05/30/2014   TROPONINI 0.04 (HH) 09/11/2016    Lab Results  Component Value Date   CHOL 189 04/26/2016   CHOL 218 (H) 03/02/2016   CHOL 138 05/31/2014   Lab Results  Component Value Date   HDL 66 04/26/2016   HDL 58 03/02/2016   HDL 40 05/31/2014   Lab Results  Component Value Date   LDLCALC 97  04/26/2016   LDLCALC 135 (H) 03/02/2016   LDLCALC 83 05/31/2014   Lab Results  Component Value Date   TRIG 129 04/26/2016   TRIG 124 03/02/2016   TRIG 75 05/31/2014   Lab Results  Component Value Date   CHOLHDL 2.9 04/26/2016   CHOLHDL 3.8 03/02/2016   CHOLHDL 2.3 10/30/2010   No results found for: LDLDIRECT    Radiology: Dg Chest 2 View  Result Date: 09/11/2016 CLINICAL DATA:  Shortness of breath. EXAM: CHEST  2 VIEW COMPARISON:  02/20/2015 and 05/13/2012 FINDINGS: There is new bilateral interstitial pulmonary edema small bilateral pleural effusions, right more than left. Heart size is normal. Aortic atherosclerosis. No acute bone abnormality. Chronic accentuation of the upper thoracic kyphosis. IMPRESSION: New interstitial pulmonary edema and bilateral pleural effusions. Aortic atherosclerosis. Electronically Signed   By: Lorriane Shire M.D.   On: 09/11/2016 07:51   Mr Abdomen Wo Contrast  Result Date: 09/05/2016 CLINICAL DATA:  Evaluate left renal lesion seen on recent ultrasound. EXAM: MRI ABDOMEN WITHOUT CONTRAST TECHNIQUE: Multiplanar multisequence MR imaging was performed without the administration of intravenous contrast. COMPARISON:  Ultrasound 09/02/2016 FINDINGS: Lower chest: No worrisome pulmonary lesions. No pleural or pericardial effusion. Hepatobiliary: No focal hepatic lesions or intrahepatic biliary dilatation. The gallbladder is normal. No common bile duct dilatation. Pancreas:  No mass, inflammation or duct dilatation. Spleen:  Adrenals/Urinary Tract: The adrenal glands are unremarkable.There are numerous bilateral renal cysts which are complicated by septations and some by hemorrhage. I do not see a solid mass or worrisome renal lesion. Examination is somewhat limited without IV contrast. No hydronephrosis. Stomach/Bowel: The stomach, duodenum, visualized small bowel and visualized colon are unremarkable. Vascular/Lymphatic:  No adenopathy.  The aorta is normal in caliber. Other:  No ascites or abdominal wall hernia. Musculoskeletal: No significant bony findings. IMPRESSION: 1. Examination is somewhat limited by lack of IV contrast. 2. No worrisome renal lesions are identified. There are septated and hemorrhagic cysts but no worrisome solid lesions are identified. Recommend followup renal ultrasound examination in 1 year. Electronically Signed   By: Marijo Sanes M.D.   On: 09/05/2016 16:44   US Renal  Result Date: 09/02/2016 CLINICAL DATA:  Acute renal failure. EXAM: RENAL / URINARY TRACT ULTRASOUND COMPLETE COMPARISON:  CT, 01/01/2016 FINDINGS: Right Kidney: Length: 12.8 cm. Increased parenchymal echogenicity. Multiple cysts the. In the midpole is a 3.1 x 2.2 x 2.3 cm cyst. In the lower pole there is a cyst which have thin septations but no other complicating features. It measures 5.2 x 3.4 x 3.3 cm. Also in the lower pole is a hypo echoic mass without internal blood flow. This is seen posteriorly. It is likely a complicated cyst. It measures 2.7 x 2.0 x 1.7 cm. No hydronephrosis appear Left Kidney: Length: 13.8 cm. Increased parenchymal echogenicity. Multiple cyst. In the posterior upper pole, there is a hypoechoic oval mass with internal blood flow measuring 3.4 x 1.9 x 2.1 cm. The also in the upper pole is a cyst with thin septations measuring 3.8 x 3.5 x 4.0 cm. Also in upper pole is a simple appearing cyst measuring 3.2 x 4.9 x 5.5 cm. No hydronephrosis. Bladder: Appears normal for degree of bladder distention. IMPRESSION: 1.  No acute finding.  No hydronephrosis. 2. Increased renal parenchyma echogenicity is consistent with medical renal disease. 3. Bilateral renal cysts. Probable complicated cyst the lower pole the right kidney. 4. Possible solid mass with apparent internal blood flow within the posterior upper pole of the left  kidney. Recommend further assessment with renal MRI with and without contrast. Electronically Signed   By: Lajean Manes M.D.   On: 09/02/2016 11:58   Dg Abd 2 Views  Result Date: 09/02/2016 CLINICAL DATA:  Left lower quadrant abdominal pain.  Some nausea. EXAM: CT, 01/01/2016 COMPARISON:  None. FINDINGS: Normal bowel gas pattern.  No obstruction or free air. Multiple calcifications in the left upper quadrant reflect healed splenic granuloma. No evidence of renal or ureteral stones. There are aortoiliac vascular calcifications. The soft tissues are otherwise unremarkable. Status post right hip total arthroplasty and pinning of a previous left proximal femur fracture. IMPRESSION: 1. No acute findings.  No evidence of bowel obstruction or free air. Electronically Signed   By: Lajean Manes M.D.   On: 09/02/2016 11:52    EKG: normal sinus rhythm  ASSESSMENT AND PLAN:   1. Shortness of breath, likely multifactorial, secondary to COPD, sleep apnea, and congestive heart failure  Recommendations  1. Agree with overall current therapy 2. Continue diuresis 3. Review 2-D  Echocardiogram 4. Further recommendations pending echocardiogram results   Signed: Shreyas Piatkowski MD,PhD, [facility] 09/11/2016, 1:07 PM

## 2016-09-12 ENCOUNTER — Encounter: Payer: Self-pay | Admitting: Respiratory Therapy

## 2016-09-12 LAB — BASIC METABOLIC PANEL
Anion gap: 9 (ref 5–15)
BUN: 36 mg/dL — AB (ref 6–20)
CHLORIDE: 102 mmol/L (ref 101–111)
CO2: 23 mmol/L (ref 22–32)
Calcium: 8.3 mg/dL — ABNORMAL LOW (ref 8.9–10.3)
Creatinine, Ser: 1.95 mg/dL — ABNORMAL HIGH (ref 0.61–1.24)
GFR calc Af Amer: 36 mL/min — ABNORMAL LOW (ref >60)
GFR calc non Af Amer: 31 mL/min — ABNORMAL LOW (ref >60)
GLUCOSE: 100 mg/dL — AB (ref 65–99)
POTASSIUM: 4.5 mmol/L (ref 3.5–5.1)
Sodium: 134 mmol/L — ABNORMAL LOW (ref 135–145)

## 2016-09-12 LAB — CBC
HEMATOCRIT: 35.6 % — AB (ref 40.0–52.0)
HEMOGLOBIN: 11.8 g/dL — AB (ref 13.0–18.0)
MCH: 29.5 pg (ref 26.0–34.0)
MCHC: 33.2 g/dL (ref 32.0–36.0)
MCV: 88.8 fL (ref 80.0–100.0)
Platelets: 314 10*3/uL (ref 150–440)
RBC: 4.01 MIL/uL — AB (ref 4.40–5.90)
RDW: 14.8 % — ABNORMAL HIGH (ref 11.5–14.5)
WBC: 13.6 10*3/uL — ABNORMAL HIGH (ref 3.8–10.6)

## 2016-09-12 LAB — ECHOCARDIOGRAM COMPLETE
Height: 67 in
WEIGHTICAEL: 4886.4 [oz_av]

## 2016-09-12 MED ORDER — LOSARTAN POTASSIUM 50 MG PO TABS
150.0000 mg | ORAL_TABLET | Freq: Every day | ORAL | Status: DC
  Administered 2016-09-13: 150 mg via ORAL
  Filled 2016-09-12: qty 3

## 2016-09-12 MED ORDER — SALINE SPRAY 0.65 % NA SOLN
1.0000 | NASAL | Status: DC | PRN
  Administered 2016-09-12: 1 via NASAL
  Filled 2016-09-12: qty 44

## 2016-09-12 NOTE — Care Management (Signed)
patient is agreeable to home health nursing and physical therapy at discharge.  Patient says CHF is new diagnosis for him.  Discussed referral to heart failure clinic and in agreement.  Patient thus far is not requiring 02.  He does not have any scales.

## 2016-09-12 NOTE — Plan of Care (Signed)
Problem: Food- and Nutrition-Related Knowledge Deficit (NB-1.1) Goal: Nutrition education Formal process to instruct or train a patient/client in a skill or to impart knowledge to help patients/clients voluntarily manage or modify food choices and eating behavior to maintain or improve health. Outcome: Completed/Met Date Met: 09/12/16 Nutrition Education Note  RD consulted for nutrition education regarding new onset CHF.  RD provided "Low Sodium Nutrition Therapy" handout from the [employer]. Reviewed patient's dietary recall. Provided examples on ways to decrease sodium intake in diet. Discouraged intake of processed foods and use of salt shaker. Encouraged fresh fruits and vegetables as well as whole grain sources of carbohydrates to maximize fiber intake.   RD discussed why it is important for patient to adhere to diet recommendations, and emphasized the role of fluids, foods to avoid, and importance of weighing self daily. Teach back method used.  Expect fair compliance.  Body mass index is 47 kg/m. Pt meets criteria for obese class III based on current BMI.  Current diet order is heart healthy, patient is consuming approximately 100% of meals at this time. Labs and medications reviewed. No further nutrition interventions warranted at this time. RD contact information provided. If additional nutrition issues arise, please re-consult RD.   [name]. Nicholas Mccaughey, MS, RD LDN Inpatient Clinical Dietitian Pager [phone]

## 2016-09-12 NOTE — Progress Notes (Signed)
[facility]                                                                                                                                                                                            Patient Demographics   [name], is a 81 y.o. male, DOB - [birth year], HWE:[id number]  Admit date - 09/11/2016   Admitting Physician Dustin Flock, MD  Outpatient Primary MD for the patient is Marguerita Merles, MD   LOS - 1  Subjective: Patient's breathing is improved his put out good amount of urine since yesterday    Review of Systems:   CONSTITUTIONAL: No documented fever. No fatigue, weakness. No weight gain, no weight loss.  EYES: No blurry or double vision.  ENT: No tinnitus. No postnasal drip. No redness of the oropharynx.  RESPIRATORY: No cough, no wheeze, no hemoptysis. Positive dyspnea.  CARDIOVASCULAR: No chest pain. No orthopnea. No palpitations. No syncope.  GASTROINTESTINAL: No nausea, no vomiting or diarrhea. No abdominal pain. No melena or hematochezia.  GENITOURINARY: No dysuria or hematuria.  ENDOCRINE: No polyuria or nocturia. No heat or cold intolerance.  HEMATOLOGY: No anemia. No bruising. No bleeding.  INTEGUMENTARY: No rashes. No lesions.  MUSCULOSKELETAL: No arthritis. Improved swelling. No gout.  NEUROLOGIC: No numbness, tingling, or ataxia. No seizure-type activity.  PSYCHIATRIC: No anxiety. No insomnia. No ADD.    Vitals:   Vitals:   09/12/16 0812 09/12/16 0816 09/12/16 0900 09/12/16 1152  BP: (!) 200/91 (!) 169/79  (!) 145/58  Pulse: 73 70  66  Resp:    (!) 21  Temp:    98.5 F (36.9 C)  TempSrc:    Oral  SpO2:    97%  Weight:   (!) 300 lb 1.6 oz (136.1 kg)   Height:        Wt Readings from Last 3 Encounters:  09/12/16 (!) 300 lb 1.6 oz (136.1 kg)  08/31/16 300 lb (136.1 kg)  02/11/16 (!) 304 lb (137.9 kg)     Intake/Output Summary (Last 24 hours) at 09/12/16 1220 Last data filed at 09/12/16  1149  Gross per 24 hour  Intake              720 ml  Output             3350 ml  Net            -2630 ml    Physical Exam:   GENERAL: Pleasant-appearing in no apparent distress.  HEAD, EYES, EARS, NOSE AND THROAT: Atraumatic, normocephalic. Extraocular muscles are intact. Pupils equal and reactive to light.  Sclerae anicteric. No conjunctival injection. No oro-pharyngeal erythema.  NECK: Supple. There is no jugular venous distention. No bruits, no lymphadenopathy, no thyromegaly.  HEART: Regular rate and rhythm,. No murmurs, no rubs, no clicks.  LUNGS: Clear to auscultation bilaterally. No rales or rhonchi. No wheezes.  ABDOMEN: Soft, flat, nontender, nondistended. Has good bowel sounds. No hepatosplenomegaly appreciated.  EXTREMITIES: No evidence of any cyanosis, clubbing, or +peripheral edema. radial pulses bilaterally.  NEUROLOGIC: The patient is alert, awake, and oriented x3 with no focal motor or sensory deficits appreciated bilaterally.  SKIN: Moist and warm with no rashes appreciated.  Psych: Not anxious, depressed LN: No inguinal LN enlargement    Antibiotics   Anti-infectives    None      Medications   Scheduled Meds: . aspirin EC  81 mg Oral BH-q7a  . atorvastatin  40 mg Oral q1800  . docusate  50 mg Oral BID  . enoxaparin (LOVENOX) injection  40 mg Subcutaneous Q12H  . famotidine  20 mg Oral Daily  . furosemide  40 mg Intravenous Q12H  . hydrALAZINE  25 mg Oral Q8H  . levothyroxine  50 mcg Oral BH-q7a  . losartan  100 mg Oral Daily  . magnesium oxide  400 mg Oral Daily  . metoprolol  50 mg Oral BID  . mometasone-formoterol  2 puff Inhalation BID  . phentermine  30 mg Oral BH-q7a  . sodium chloride flush  3 mL Intravenous Q12H  . tiotropium  18 mcg Inhalation Daily   Continuous Infusions:  PRN Meds:.sodium chloride, acetaminophen **OR** acetaminophen, albuterol, HYDROcodone-acetaminophen, morphine injection, nitroGLYCERIN, ondansetron **OR** ondansetron  (ZOFRAN) IV, oxyCODONE-acetaminophen, sodium chloride flush   Data Review:   Micro Results No results found for this or any previous visit (from the past 240 hour(s)).  Radiology Reports Dg Chest 2 View  Result Date: 09/11/2016 CLINICAL DATA:  Shortness of breath. EXAM: CHEST  2 VIEW COMPARISON:  02/20/2015 and 05/13/2012 FINDINGS: There is new bilateral interstitial pulmonary edema small bilateral pleural effusions, right more than left. Heart size is normal. Aortic atherosclerosis. No acute bone abnormality. Chronic accentuation of the upper thoracic kyphosis. IMPRESSION: New interstitial pulmonary edema and bilateral pleural effusions. Aortic atherosclerosis. Electronically Signed   By: Lorriane Shire M.D.   On: 09/11/2016 07:51   Mr Abdomen Wo Contrast  Result Date: 09/05/2016 CLINICAL DATA:  Evaluate left renal lesion seen on recent ultrasound. EXAM: MRI ABDOMEN WITHOUT CONTRAST TECHNIQUE: Multiplanar multisequence MR imaging was performed without the administration of intravenous contrast. COMPARISON:  Ultrasound 09/02/2016 FINDINGS: Lower chest: No worrisome pulmonary lesions. No pleural or pericardial effusion. Hepatobiliary: No focal hepatic lesions or intrahepatic biliary dilatation. The gallbladder is normal. No common bile duct dilatation. Pancreas:  No mass, inflammation or duct dilatation. Spleen: Adrenals/Urinary Tract: The adrenal glands are unremarkable.There are numerous bilateral renal cysts which are complicated by septations and some by hemorrhage. I do not see a solid mass or worrisome renal lesion. Examination is somewhat limited without IV contrast. No hydronephrosis. Stomach/Bowel: The stomach, duodenum, visualized small bowel and visualized colon are unremarkable. Vascular/Lymphatic:  No adenopathy.  The aorta is normal in caliber. Other:  No ascites or abdominal wall hernia. Musculoskeletal: No significant bony findings. IMPRESSION: 1. Examination is somewhat limited by lack  of IV contrast. 2. No worrisome renal lesions are identified. There are septated and hemorrhagic cysts but no worrisome solid lesions are identified. Recommend followup renal ultrasound examination in 1 year. Electronically Signed   By: Ricky Stabs.D.  On: 09/05/2016 16:44   US Renal  Result Date: 09/02/2016 CLINICAL DATA:  Acute renal failure. EXAM: RENAL / URINARY TRACT ULTRASOUND COMPLETE COMPARISON:  CT, 01/01/2016 FINDINGS: Right Kidney: Length: 12.8 cm. Increased parenchymal echogenicity. Multiple cysts the. In the midpole is a 3.1 x 2.2 x 2.3 cm cyst. In the lower pole there is a cyst which have thin septations but no other complicating features. It measures 5.2 x 3.4 x 3.3 cm. Also in the lower pole is a hypo echoic mass without internal blood flow. This is seen posteriorly. It is likely a complicated cyst. It measures 2.7 x 2.0 x 1.7 cm. No hydronephrosis appear Left Kidney: Length: 13.8 cm. Increased parenchymal echogenicity. Multiple cyst. In the posterior upper pole, there is a hypoechoic oval mass with internal blood flow measuring 3.4 x 1.9 x 2.1 cm. The also in the upper pole is a cyst with thin septations measuring 3.8 x 3.5 x 4.0 cm. Also in upper pole is a simple appearing cyst measuring 3.2 x 4.9 x 5.5 cm. No hydronephrosis. Bladder: Appears normal for degree of bladder distention. IMPRESSION: 1. No acute finding.  No hydronephrosis. 2. Increased renal parenchyma echogenicity is consistent with medical renal disease. 3. Bilateral renal cysts. Probable complicated cyst the lower pole the right kidney. 4. Possible solid mass with apparent internal blood flow within the posterior upper pole of the left kidney. Recommend further assessment with renal MRI with and without contrast. Electronically Signed   By: Lajean Manes M.D.   On: 09/02/2016 11:58   Dg Abd 2 Views  Result Date: 09/02/2016 CLINICAL DATA:  Left lower quadrant abdominal pain.  Some nausea. EXAM: CT, 01/01/2016 COMPARISON:   None. FINDINGS: Normal bowel gas pattern.  No obstruction or free air. Multiple calcifications in the left upper quadrant reflect healed splenic granuloma. No evidence of renal or ureteral stones. There are aortoiliac vascular calcifications. The soft tissues are otherwise unremarkable. Status post right hip total arthroplasty and pinning of a previous left proximal femur fracture. IMPRESSION: 1. No acute findings.  No evidence of bowel obstruction or free air. Electronically Signed   By: Lajean Manes M.D.   On: 09/02/2016 11:52     CBC  Recent Labs Lab 09/11/16 0636 09/12/16 0617  WBC 10.7* 13.6*  HGB 11.7* 11.8*  HCT 34.4* 35.6*  PLT 296 314  MCV 88.3 88.8  MCH 30.1 29.5  MCHC 34.0 33.2  RDW 14.6* 14.8*  LYMPHSABS 1.8  --   MONOABS 1.0  --   EOSABS 0.1  --   BASOSABS 0.0  --     Chemistries   Recent Labs Lab 09/11/16 0636 09/12/16 0617  NA 132* 134*  K 4.0 4.5  CL 103 102  CO2 24 23  GLUCOSE 102* 100*  BUN 31* 36*  CREATININE 1.78* 1.95*  CALCIUM 8.2* 8.3*   ------------------------------------------------------------------------------------------------------------------ estimated creatinine clearance is 40.9 mL/min (by C-G formula based on SCr of 1.95 mg/dL (H)). ------------------------------------------------------------------------------------------------------------------ No results for input(s): HGBA1C in the last 72 hours. ------------------------------------------------------------------------------------------------------------------ No results for input(s): CHOL, HDL, LDLCALC, TRIG, CHOLHDL, LDLDIRECT in the last 72 hours. ------------------------------------------------------------------------------------------------------------------  Recent Labs  09/11/16 0954  TSH 2.854   ------------------------------------------------------------------------------------------------------------------ No results for input(s): VITAMINB12, FOLATE, FERRITIN, TIBC,  IRON, RETICCTPCT in the last 72 hours.  Coagulation profile No results for input(s): INR, PROTIME in the last 168 hours.  No results for input(s): DDIMER in the last 72 hours.  Cardiac Enzymes  Recent Labs Lab 09/11/16 0954 09/11/16 1429 09/11/16  2048  TROPONINI 0.04* 0.05* 0.05*   ------------------------------------------------------------------------------------------------------------------ Invalid input(s): POCBNP    Assessment & Plan   IMPRESSION AND PLAN: Patient is a 81 year old African-American male recent admission for UTI nausea vomiting being admitted for shortness of breath  1. Acute CHF based on previous echo  diastolic CHF Continue IV Lasix and monitor renal function and ins and outs  2. Sleep apnea we'll continue CPAP at nighttime  3. COPD without any exacerbation We'll continue home inhalers as taking at home  4. Essential hypertension continue losartan and metoprolol  5. Hypothyroidism Continue levothyroxine  6. Lipidemia nonspecified continue Lipitor  7. Chest pressure Cardiac enzymes when necessary nitroglycerin and morphine      Code Status Orders        Start     Ordered   09/12/16 0843  Full code  Continuous     09/12/16 0842    Code Status History    Date Active Date Inactive Code Status Order ID Comments User Context   09/11/2016  8:46 AM 09/11/2016  4:59 PM Full Code [id number]  Dustin Flock, MD ED   08/31/2016  3:33 AM 09/06/2016  5:14 PM Full Code [id number]  Saundra Shelling, MD Inpatient           Consults  CARDS   DVT Prophylaxis  Lovenox   Lab Results  Component Value Date   PLT 314 09/12/2016     Time Spent in minutes   35MIN  Greater than 50% of time spent in care coordination and counseling patient regarding the condition and plan of care.   Dustin Flock M.D on 09/12/2016 at 12:20 PM  Between 7am to 6pm - Pager - [phone]  After 6pm go to [email or web address] - password [email or web address] [facility]   Office  [phone]

## 2016-09-12 NOTE — Progress Notes (Signed)
[facility]  SUBJECTIVE: I'm doing better   Vitals:   09/11/16 2230 09/12/16 0422 09/12/16 0812 09/12/16 0816  BP: (!) 191/84 (!) 193/92 (!) 200/91 (!) 169/79  Pulse: 79 70 73 70  Resp:  15    Temp:  97.7 F (36.5 C)    TempSrc:      SpO2:  98%    Weight:      Height:         Intake/Output Summary (Last 24 hours) at 09/12/16 0835 Last data filed at 09/12/16 6440  Gross per 24 hour  Intake              480 ml  Output             3250 ml  Net            -2770 ml      PHYSICAL EXAM  General: Well developed, well nourished, in no acute distress HEENT:  Normocephalic and atramatic Neck:  No JVD.  Lungs: Clear bilaterally to auscultation and percussion. Heart: HRRR . Normal S1 and S2 without gallops or murmurs.  Abdomen: Bowel sounds are positive, abdomen soft and non-tender  Msk:  Back normal, normal gait. Normal strength and tone for age. Extremities: No clubbing, cyanosis or edema.   Neuro: Alert and oriented X 3. Psych:  Good affect, responds appropriately   LABS: Basic Metabolic Panel:  Recent Labs  09/11/16 0636 09/12/16 0617  NA 132* 134*  K 4.0 4.5  CL 103 102  CO2 24 23  GLUCOSE 102* 100*  BUN 31* 36*  CREATININE 1.78* 1.95*  CALCIUM 8.2* 8.3*   Liver Function Tests: No results for input(s): AST, ALT, ALKPHOS, BILITOT, PROT, ALBUMIN in the last 72 hours. No results for input(s): LIPASE, AMYLASE in the last 72 hours. CBC:  Recent Labs  09/11/16 0636 09/12/16 0617  WBC 10.7* 13.6*  NEUTROABS 7.7*  --   HGB 11.7* 11.8*  HCT 34.4* 35.6*  MCV 88.3 88.8  PLT 296 314   Cardiac Enzymes:  Recent Labs  09/11/16 0954 09/11/16 1429 09/11/16 2048  TROPONINI 0.04* 0.05* 0.05*   BNP: Invalid input(s): POCBNP D-Dimer: No results for input(s): DDIMER in the last 72 hours. Hemoglobin A1C: No results for input(s): HGBA1C in the last 72 hours. Fasting Lipid Panel: No results for input(s): CHOL, HDL, LDLCALC, TRIG, CHOLHDL, LDLDIRECT in the  last 72 hours. Thyroid Function Tests:  Recent Labs  09/11/16 0954  TSH 2.854   Anemia Panel: No results for input(s): VITAMINB12, FOLATE, FERRITIN, TIBC, IRON, RETICCTPCT in the last 72 hours.  Dg Chest 2 View  Result Date: 09/11/2016 CLINICAL DATA:  Shortness of breath. EXAM: CHEST  2 VIEW COMPARISON:  02/20/2015 and 05/13/2012 FINDINGS: There is new bilateral interstitial pulmonary edema small bilateral pleural effusions, right more than left. Heart size is normal. Aortic atherosclerosis. No acute bone abnormality. Chronic accentuation of the upper thoracic kyphosis. IMPRESSION: New interstitial pulmonary edema and bilateral pleural effusions. Aortic atherosclerosis. Electronically Signed   By: Lorriane Shire M.D.   On: 09/11/2016 07:51     Echo pending  TELEMETRY: Normal sinus rhythm:  ASSESSMENT AND PLAN:  Active Problems:   Acute CHF (congestive heart failure) ([place])    1. Respiratory failure, multifactorial, secondary to COPD, obstructive sleep apnea, and probable diastolic congestive heart failure, improved after diuresis  Recommendations  1. Agree with overall current therapy 2. Continue diuresis 3. Carefully monitor renal status 4. Review 2-D echocardiogram 5. Further recommendations pending  echocardiogram results   Isaias Cowman, MD, PhD, [facility] 09/12/2016 8:35 AM

## 2016-09-13 LAB — BASIC METABOLIC PANEL
Anion gap: 6 (ref 5–15)
BUN: 41 mg/dL — AB (ref 6–20)
CALCIUM: 8.4 mg/dL — AB (ref 8.9–10.3)
CO2: 28 mmol/L (ref 22–32)
CREATININE: 1.99 mg/dL — AB (ref 0.61–1.24)
Chloride: 104 mmol/L (ref 101–111)
GFR calc Af Amer: 35 mL/min — ABNORMAL LOW (ref >60)
GFR, EST NON AFRICAN AMERICAN: 30 mL/min — AB (ref >60)
Glucose, Bld: 96 mg/dL (ref 65–99)
Potassium: 5 mmol/L (ref 3.5–5.1)
SODIUM: 138 mmol/L (ref 135–145)

## 2016-09-13 MED ORDER — FUROSEMIDE 40 MG PO TABS: 20.0000 mg | ORAL_TABLET | Freq: Two times a day (BID) | ORAL | 0 refills | Status: DC

## 2016-09-13 MED ORDER — HYDRALAZINE HCL 25 MG PO TABS: 50.0000 mg | ORAL_TABLET | Freq: Three times a day (TID) | ORAL | 0 refills | Status: DC

## 2016-09-13 MED ORDER — HYDRALAZINE HCL 20 MG/ML IJ SOLN
10.0000 mg | Freq: Once | INTRAMUSCULAR | Status: AC
  Administered 2016-09-13: 10 mg via INTRAVENOUS
  Filled 2016-09-13: qty 1

## 2016-09-13 MED ORDER — LOSARTAN POTASSIUM 50 MG PO TABS: 150.0000 mg | ORAL_TABLET | Freq: Every day | ORAL | 0 refills | Status: DC

## 2016-09-13 MED ORDER — LOSARTAN POTASSIUM 50 MG PO TABS: 100.0000 mg | ORAL_TABLET | Freq: Every day | ORAL | 0 refills | Status: DC

## 2016-09-13 NOTE — Discharge Summary (Signed)
[name], 81 y.o., DOB 10/06/1935, MRN [id number]. Admission date: 09/11/2016 Discharge Date 09/13/2016 Primary MD Marguerita Merles, MD Admitting Physician Dustin Flock, MD  Admission Diagnosis  Congestive heart failure, unspecified congestive heart failure chronicity, unspecified congestive heart failure type [facility]) [I50.9]  Discharge Diagnosis   Active Problems:   Acute chronic systolic CHF (congestive heart failure) (HCC)   Bipolar affective disorder   COPD without exasperation   Coronary artery disease   Essential hypertension   Hyperlipidemia unspecified   History of prostate cancer  Peripheral vascular disease        Hospital Course [name]  is a 81 y.o. male with a known history of  COPD, alcoholism, asthma, bipolar affective disorder, obstructive sleep apnea who was recently hospitalized for nausea vomiting and urinary tract infection. Patient returns today complaining of shortness of breath for one days duration. Patient was recently in the hospital and was treated with IV fluids. He was noted to have acute CHF. He was treated with IV Lasix. His symptoms improved significantly with IV diuresis. Patient did have a echo which showed change compared to previous echo with systolic dysfunction. Patient's breathing is much improved. He is on room air he was seen by cardiology who will need outpatient follow-up. He will likely need ischemic workup.             Consults  cardiology  Significant Tests:  See full reports for all details     Dg Chest 2 View  Result Date: 09/11/2016 CLINICAL DATA:  Shortness of breath. EXAM: CHEST  2 VIEW COMPARISON:  02/20/2015 and 05/13/2012 FINDINGS: There is new bilateral interstitial pulmonary edema small bilateral pleural effusions, right more than left. Heart size is normal. Aortic atherosclerosis. No acute bone abnormality. Chronic accentuation of the upper thoracic kyphosis. IMPRESSION: New interstitial pulmonary edema and  bilateral pleural effusions. Aortic atherosclerosis. Electronically Signed   By: Lorriane Shire M.D.   On: 09/11/2016 07:51   Mr Abdomen Wo Contrast  Result Date: 09/05/2016 CLINICAL DATA:  Evaluate left renal lesion seen on recent ultrasound. EXAM: MRI ABDOMEN WITHOUT CONTRAST TECHNIQUE: Multiplanar multisequence MR imaging was performed without the administration of intravenous contrast. COMPARISON:  Ultrasound 09/02/2016 FINDINGS: Lower chest: No worrisome pulmonary lesions. No pleural or pericardial effusion. Hepatobiliary: No focal hepatic lesions or intrahepatic biliary dilatation. The gallbladder is normal. No common bile duct dilatation. Pancreas:  No mass, inflammation or duct dilatation. Spleen: Adrenals/Urinary Tract: The adrenal glands are unremarkable.There are numerous bilateral renal cysts which are complicated by septations and some by hemorrhage. I do not see a solid mass or worrisome renal lesion. Examination is somewhat limited without IV contrast. No hydronephrosis. Stomach/Bowel: The stomach, duodenum, visualized small bowel and visualized colon are unremarkable. Vascular/Lymphatic:  No adenopathy.  The aorta is normal in caliber. Other:  No ascites or abdominal wall hernia. Musculoskeletal: No significant bony findings. IMPRESSION: 1. Examination is somewhat limited by lack of IV contrast. 2. No worrisome renal lesions are identified. There are septated and hemorrhagic cysts but no worrisome solid lesions are identified. Recommend followup renal ultrasound examination in 1 year. Electronically Signed   By: Marijo Sanes M.D.   On: 09/05/2016 16:44   US Renal  Result Date: 09/02/2016 CLINICAL DATA:  Acute renal failure. EXAM: RENAL / URINARY TRACT ULTRASOUND COMPLETE COMPARISON:  CT, 01/01/2016 FINDINGS: Right Kidney: Length: 12.8 cm. Increased parenchymal echogenicity. Multiple cysts the. In the midpole is a 3.1 x 2.2 x 2.3 cm cyst. In the lower pole there  is a cyst which have thin  septations but no other complicating features. It measures 5.2 x 3.4 x 3.3 cm. Also in the lower pole is a hypo echoic mass without internal blood flow. This is seen posteriorly. It is likely a complicated cyst. It measures 2.7 x 2.0 x 1.7 cm. No hydronephrosis appear Left Kidney: Length: 13.8 cm. Increased parenchymal echogenicity. Multiple cyst. In the posterior upper pole, there is a hypoechoic oval mass with internal blood flow measuring 3.4 x 1.9 x 2.1 cm. The also in the upper pole is a cyst with thin septations measuring 3.8 x 3.5 x 4.0 cm. Also in upper pole is a simple appearing cyst measuring 3.2 x 4.9 x 5.5 cm. No hydronephrosis. Bladder: Appears normal for degree of bladder distention. IMPRESSION: 1. No acute finding.  No hydronephrosis. 2. Increased renal parenchyma echogenicity is consistent with medical renal disease. 3. Bilateral renal cysts. Probable complicated cyst the lower pole the right kidney. 4. Possible solid mass with apparent internal blood flow within the posterior upper pole of the left kidney. Recommend further assessment with renal MRI with and without contrast. Electronically Signed   By: Lajean Manes M.D.   On: 09/02/2016 11:58   Dg Abd 2 Views  Result Date: 09/02/2016 CLINICAL DATA:  Left lower quadrant abdominal pain.  Some nausea. EXAM: CT, 01/01/2016 COMPARISON:  None. FINDINGS: Normal bowel gas pattern.  No obstruction or free air. Multiple calcifications in the left upper quadrant reflect healed splenic granuloma. No evidence of renal or ureteral stones. There are aortoiliac vascular calcifications. The soft tissues are otherwise unremarkable. Status post right hip total arthroplasty and pinning of a previous left proximal femur fracture. IMPRESSION: 1. No acute findings.  No evidence of bowel obstruction or free air. Electronically Signed   By: Lajean Manes M.D.   On: 09/02/2016 11:52       Today   Subjective:   [name] feeling better shortness of breath  improved  Objective:   Blood pressure (!) 132/59, pulse 65, temperature 97.5 F (36.4 C), temperature source Oral, resp. rate 19, height 5\' 7"  (1.702 m), weight 295 lb 8 oz (134 kg), SpO2 95 %.  .  Intake/Output Summary (Last 24 hours) at 09/13/16 1201 Last data filed at 09/13/16 0940  Gross per 24 hour  Intake              840 ml  Output             1500 ml  Net             -660 ml    Exam VITAL SIGNS: Blood pressure (!) 132/59, pulse 65, temperature 97.5 F (36.4 C), temperature source Oral, resp. rate 19, height 5\' 7"  (1.702 m), weight 295 lb 8 oz (134 kg), SpO2 95 %.  GENERAL:  81 y.o.-year-old patient lying in the bed with no acute distress.  EYES: Pupils equal, round, reactive to light and accommodation. No scleral icterus. Extraocular muscles intact.  HEENT: Head atraumatic, normocephalic. Oropharynx and nasopharynx clear.  NECK:  Supple, no jugular venous distention. No thyroid enlargement, no tenderness.  LUNGS: Normal breath sounds bilaterally, no wheezing, rales,rhonchi or crepitation. No use of accessory muscles of respiration.  CARDIOVASCULAR: S1, S2 normal. No murmurs, rubs, or gallops.  ABDOMEN: Soft, nontender, nondistended. Bowel sounds present. No organomegaly or mass.  EXTREMITIES: No pedal edema, cyanosis, or clubbing.  NEUROLOGIC: Cranial nerves II through XII are intact. Muscle strength 5/5 in all extremities. Sensation intact.  Gait not checked.  PSYCHIATRIC: The patient is alert and oriented x 3.  SKIN: No obvious rash, lesion, or ulcer.   Data Review     CBC w Diff: Lab Results  Component Value Date   WBC 13.6 (H) 09/12/2016   HGB 11.8 (L) 09/12/2016   HGB 13.8 04/03/2015   HCT 35.6 (L) 09/12/2016   HCT 41.2 04/03/2015   PLT 314 09/12/2016   PLT 205 04/03/2015   LYMPHOPCT 17 09/11/2016   LYMPHOPCT 29.0 05/31/2014   MONOPCT 10 09/11/2016   MONOPCT 9.3 05/31/2014   EOSPCT 1 09/11/2016   EOSPCT 3.9 05/31/2014   BASOPCT 0 09/11/2016   BASOPCT 0.7  05/31/2014   CMP: Lab Results  Component Value Date   NA 138 09/13/2016   NA 137 12/23/2015   NA 125 (L) 04/03/2015   K 5.0 09/13/2016   K 3.6 04/03/2015   CL 104 09/13/2016   CL 95 (L) 04/03/2015   CO2 28 09/13/2016   CO2 25 04/03/2015   BUN 41 (H) 09/13/2016   BUN 15 12/23/2015   BUN 8 04/03/2015   CREATININE 1.99 (H) 09/13/2016   CREATININE 0.83 04/03/2015   PROT 6.4 (L) 08/30/2016   PROT 6.1 04/26/2016   PROT 7.2 04/03/2015   ALBUMIN 2.9 (L) 08/30/2016   ALBUMIN 3.3 (L) 04/26/2016   ALBUMIN 3.5 04/03/2015   BILITOT 0.5 08/30/2016   BILITOT <0.2 04/26/2016   BILITOT 0.9 04/03/2015   ALKPHOS 97 08/30/2016   ALKPHOS 104 04/03/2015   AST 33 08/30/2016   AST 23 04/03/2015   ALT 17 08/30/2016   ALT 17 04/03/2015  .  Micro Results No results found for this or any previous visit (from the past 240 hour(s)).      Code Status Orders        Start     Ordered   09/12/16 0843  Full code  Continuous     09/12/16 0842    Code Status History    Date Active Date Inactive Code Status Order ID Comments User Context   09/11/2016  8:46 AM 09/11/2016  4:59 PM Full Code [id number]  Dustin Flock, MD ED   08/31/2016  3:33 AM 09/06/2016  5:14 PM Full Code [id number]  Saundra Shelling, MD Inpatient          Follow-up Information    Alisa Graff, FNP. Go on 10/03/2016.   Specialty:  Family Medicine Why:  at 11:30am to the [facility] Contact information: [address] [address] [phone]           Discharge Medications     Medication List    STOP taking these medications   cephALEXin 500 MG capsule Commonly known as:  KEFLEX   phentermine 30 MG capsule     TAKE these medications   aspirin EC 81 MG tablet Take 81 mg by mouth every morning.   atorvastatin 40 MG tablet Commonly known as:  LIPITOR Take 1 tablet (40 mg total) by mouth daily.   docusate sodium 50 MG capsule Commonly known as:  COLACE Take 50 mg  by mouth 2 (two) times daily.   famotidine 20 MG tablet Commonly known as:  PEPCID Take 1 tablet (20 mg total) by mouth daily.   fluticasone-salmeterol 115-21 MCG/ACT inhaler Commonly known as:  ADVAIR HFA Inhale 2 puffs into the lungs 2 (two) times daily.   furosemide 40 MG tablet Commonly known as:  LASIX Take 0.5 tablets (20 mg  total) by mouth 2 (two) times daily.   hydrALAZINE 25 MG tablet Commonly known as:  APRESOLINE Take 2 tablets (50 mg total) by mouth every 8 (eight) hours. What changed:  how much to take   HYDROcodone-acetaminophen 5-325 MG tablet Commonly known as:  NORCO/VICODIN Take 1-2 tablets by mouth every 6 (six) hours as needed for moderate pain.   levothyroxine 50 MCG tablet Commonly known as:  SYNTHROID, LEVOTHROID Take 50 mcg by mouth every morning.   losartan 50 MG tablet Commonly known as:  COZAAR Take 2 tablets (100 mg total) by mouth daily. What changed:  medication strength   magnesium oxide 400 MG tablet Commonly known as:  MAG-OX Take 400 mg by mouth daily.   metoprolol 50 MG tablet Commonly known as:  LOPRESSOR Take 1 tablet (50 mg total) by mouth 2 (two) times daily.   NITROSTAT 0.4 MG SL tablet Generic drug:  nitroGLYCERIN Take 0.4 mg by mouth every 5 (five) minutes x 3 doses as needed for chest pain. As needed for chest pain   ondansetron 4 MG disintegrating tablet Commonly known as:  ZOFRAN ODT Take 1 tablet (4 mg total) by mouth every 6 (six) hours as needed for nausea or vomiting.   predniSONE 10 MG (21) Tbpk tablet Commonly known as:  STERAPRED UNI-PAK 21 TAB Take 1 tablet (10 mg total) by mouth daily. Take as directed,taper by 10 mg daily   PROAIR HFA 108 (90 Base) MCG/ACT inhaler Generic drug:  albuterol Inhale 2 puffs into the lungs every 6 (six) hours as needed for wheezing or shortness of breath. Reported on 04/21/2016   tiotropium 18 MCG inhalation capsule Commonly known as:  SPIRIVA Place 18 mcg into inhaler and  inhale daily.          Total Time in preparing paper work, data evaluation and todays exam - 35 minutes  Dustin Flock M.D on 09/13/2016 at 12:01 [facility]  [facility]   Office  [phone]

## 2016-09-13 NOTE — Care Management (Signed)
Obtained patient scales from [facility].  Updated Advanced and inquired about telehealth.  Discussed the need to assess room air exertional sats with primary nurse prior to discharge

## 2016-09-13 NOTE — Progress Notes (Signed)
Patient is discharge in a stable condition, summary and f/u care given , verbalized understanding , left with daughter

## 2016-09-13 NOTE — Care Management (Signed)
Patient was okay, did not want or need anything.

## 2016-09-18 ENCOUNTER — Encounter: Payer: Self-pay | Admitting: Respiratory Therapy

## 2016-09-18 DIAGNOSIS — G4733 Obstructive sleep apnea (adult) (pediatric): Secondary | ICD-10-CM

## 2016-09-18 DIAGNOSIS — J449 Chronic obstructive pulmonary disease, unspecified: Secondary | ICD-10-CM

## 2016-09-18 NOTE — Progress Notes (Signed)
Pulmonary Individual Treatment Plan  Patient Details  Name: GINA COSTILLA MRN: [id number] Date of Birth: 09/02/1935 Referring Provider:   Flowsheet Row Pulmonary Rehab from 05/30/2016 in [facility] Cardiac and Pulmonary Rehab  Referring Provider  Raul Del      Initial Encounter Date:  Flowsheet Row Pulmonary Rehab from 05/30/2016 in [facility] Cardiac and Pulmonary Rehab  Date  05/30/16  Referring Provider  Raul Del      Visit Diagnosis: Obstructive sleep apnea syndrome  Morbid obesity due to excess calories (HCC)  COPD, mild ([place])  Patient's Home Medications on Admission:  Current Outpatient Prescriptions:    albuterol (PROAIR HFA) 108 (90 Base) MCG/ACT inhaler, Inhale 2 puffs into the lungs every 6 (six) hours as needed for wheezing or shortness of breath. Reported on 04/21/2016, Disp: , Rfl:    aspirin EC 81 MG tablet, Take 81 mg by mouth every morning., Disp: , Rfl:    atorvastatin (LIPITOR) 40 MG tablet, Take 1 tablet (40 mg total) by mouth daily. (Patient not taking: Reported on 09/11/2016), Disp: 30 tablet, Rfl: 3   docusate sodium (COLACE) 50 MG capsule, Take 50 mg by mouth 2 (two) times daily., Disp: , Rfl:    famotidine (PEPCID) 20 MG tablet, Take 1 tablet (20 mg total) by mouth daily., Disp: 30 tablet, Rfl: 0   fluticasone-salmeterol (ADVAIR HFA) 115-21 MCG/ACT inhaler, Inhale 2 puffs into the lungs 2 (two) times daily., Disp: , Rfl:    furosemide (LASIX) 40 MG tablet, Take 0.5 tablets (20 mg total) by mouth 2 (two) times daily., Disp: 60 tablet, Rfl: 0   hydrALAZINE (APRESOLINE) 25 MG tablet, Take 2 tablets (50 mg total) by mouth every 8 (eight) hours., Disp: 60 tablet, Rfl: 0   HYDROcodone-acetaminophen (NORCO/VICODIN) 5-325 MG tablet, Take 1-2 tablets by mouth every 6 (six) hours as needed for moderate pain. (Patient not taking: Reported on 08/31/2016), Disp: 30 tablet, Rfl: 0   levothyroxine (SYNTHROID, LEVOTHROID) 50 MCG tablet, Take 50 mcg by mouth every morning. , Disp: ,  Rfl:    losartan (COZAAR) 50 MG tablet, Take 2 tablets (100 mg total) by mouth daily., Disp: 90 tablet, Rfl: 0   magnesium oxide (MAG-OX) 400 MG tablet, Take 400 mg by mouth daily., Disp: , Rfl:    metoprolol (LOPRESSOR) 50 MG tablet, Take 1 tablet (50 mg total) by mouth 2 (two) times daily., Disp: 60 tablet, Rfl: 0   nitroGLYCERIN (NITROSTAT) 0.4 MG SL tablet, Take 0.4 mg by mouth every 5 (five) minutes x 3 doses as needed for chest pain. As needed for chest pain, Disp: , Rfl:    ondansetron (ZOFRAN ODT) 4 MG disintegrating tablet, Take 1 tablet (4 mg total) by mouth every 6 (six) hours as needed for nausea or vomiting. (Patient not taking: Reported on 09/11/2016), Disp: 20 tablet, Rfl: 0   predniSONE (STERAPRED UNI-PAK 21 TAB) 10 MG (21) TBPK tablet, Take 1 tablet (10 mg total) by mouth daily. Take as directed,taper by 10 mg daily, Disp: 21 tablet, Rfl: 0   tiotropium (SPIRIVA) 18 MCG inhalation capsule, Place 18 mcg into inhaler and inhale daily., Disp: , Rfl:   Past Medical History: Past Medical History:  Diagnosis Date   Alcoholism ([place])    Asthma    Bipolar affective disorder ([place])    COPD (chronic obstructive pulmonary disease) ([place])    COPD   Coronary artery disease February 2005   PCI and Taxus drug-eluting stent placement to the distal RCA (3.5 x 12 mm)   Degenerative  joint disease    Essential hypertension    Hyperlipidemia    Hypertension    Prostate CA ([place])    prostate ca dx 69 ys ago;   Prostate CA ([place])    prostate ca dx 20 yrs ago   PVD (peripheral vascular disease) ([place])    Tobacco abuse     Tobacco Use: History  Smoking Status   Former Smoker   Packs/day: 1.00   Years: 50.00   Types: Cigarettes  Smokeless Tobacco   Never Used    Labs: Recent Chemical engineer    Labs for ITP Cardiac and Pulmonary Rehab Latest Ref Rng & Units 10/30/2010 01/04/2011 05/31/2014 03/02/2016 04/26/2016   Cholestrol 100 - 199 mg/dL 161 ATP III  CLASSIFICATION: <200     mg/dL   Desirable 200-239  mg/dL   Borderline High >=240    mg/dL   High - 138 218(H) 189   LDLCALC 0 - 99 mg/dL 78 Total Cholesterol/HDL:CHD Risk Coronary Heart Disease Risk Table Men   Women 1/2 Average Risk   3.4   3.3 Average Risk       5.0   4.4 2 X Average Risk   9.6   7.1 3 X Average Risk  23.4   11.0 Use the calculated Patient Ratio above and the CHD Risk Table to determine the patient's CHD Risk. ATP III CLASSIFICATION (LDL): <100     mg/dL   Optimal 100-129  mg/dL   Near or Above Optimal 130-159  mg/dL   Borderline 160-189  mg/dL   High >190     mg/dL   Very High - 83 135(H) 97   HDL >39 mg/dL 71 - 40 58 66   Trlycerides 0 - 149 mg/dL 60 - 75 124 129   Hemoglobin A1c 4.6 - 6.1 % - - - - -   TCO2 0 - 100 mmol/L - 26 - - -       ADL UCSD:     Pulmonary Assessment Scores    Row Name 05/30/16 0900 07/14/16 1118       ADL UCSD   ADL Phase Entry  --    SOB Score total 49 66    Rest 1 1    Walk 3 3    Stairs 4 4    Bath 2 4    Dress 2 3    Shop 3 4       Pulmonary Function Assessment:     Pulmonary Function Assessment - 05/30/16 0900      Pulmonary Function Tests   FVC% 87 %   FEV1% 83 %   FEV1/FVC Ratio 73   RV% 80 %   DLCO% 60 %     Initial Spirometry Results   Comments PFT performed on 12/16/2015     Breath   Bilateral Breath Sounds Clear   Shortness of Breath Yes;Fear of Shortness of Breath;Limiting activity      Exercise Target Goals:    Exercise Program Goal: Individual exercise prescription set with THRR, safety & activity barriers. Participant demonstrates ability to understand and report RPE using BORG scale, to self-measure pulse accurately, and to acknowledge the importance of the exercise prescription.  Exercise Prescription Goal: Starting with aerobic activity 30 plus minutes a day, 3 days per week for initial exercise prescription. Provide home exercise prescription and guidelines that participant  acknowledges understanding prior to discharge.  Activity Barriers & Risk Stratification:     Activity Barriers & Cardiac Risk Stratification -  05/30/16 0900      Activity Barriers & Cardiac Risk Stratification   Activity Barriers Shortness of Breath;Deconditioning;Muscular Weakness   Cardiac Risk Stratification Moderate      6 Minute Walk:     6 Minute Walk    Row Name 05/30/16 1535 07/14/16 1058       6 Minute Walk   Phase  -- (P)  Mid Program    Distance 533 feet (P)  780 feet    Walk Time 4 minutes (P)  6 minutes    # of Rest Breaks 1  stopped at 4:00 (P)  0    MPH 1.5  --    METS 2.1  --    RPE 18 (P)  13    Perceived Dyspnea   -- (P)  5    Symptoms Yes (comment) (P)  No    Comments overall fatigue - legs and breathing  --    Resting HR 67 bpm (P)  81 bpm    Resting BP 154/70 (P)  160/78    Max Ex. HR 123 bpm (P)  130 bpm    Max Ex. BP 160/70 (P)  230/80    2 Minute Post BP 154/72 (P)  170/80      Interval HR   Baseline HR 67 (P)  81    1 Minute HR 105  --    2 Minute HR 108  --    3 Minute HR 117 (P)  128    4 Minute HR 123  --    2 Minute Post HR 72  --    Interval Heart Rate? Yes  --      Interval Oxygen   Interval Oxygen? Yes  --    Baseline Oxygen Saturation % 98 % (P)  98 %    Baseline Liters of Oxygen  -- (P)  --  Room Air    1 Minute Oxygen Saturation % 96 %  --    2 Minute Oxygen Saturation % 96 %  --    3 Minute Oxygen Saturation % 93 % (P)  90 %    4 Minute Oxygen Saturation % 93 %  --    6 Minute Oxygen Saturation %  -- (P)  89 %  Room air    6 Minute Liters of Oxygen  -- (P)  --  Room Air    2 Minute Post Oxygen Saturation % 98 % (P)  99 %  Room Air       Initial Exercise Prescription:     Initial Exercise Prescription - 05/30/16 1500      Date of Initial Exercise RX and Referring Provider   Date 05/30/16   Referring Provider Raul Del     Treadmill   MPH 1.3   Grade 0   Minutes 15  4/4/4   METs 2     Recumbant Elliptical    Level 1   Watts 50   Minutes 15  can do 4/4/4    METs 2.1     T5 Nustep   Level 1   Watts 30   Minutes 15  4/4/4   METs 2.1     Prescription Details   Frequency (times per week) 3   Duration Progress to 45 minutes of aerobic exercise without signs/symptoms of physical distress     Intensity   THRR 40-80% of Max Heartrate 97-126   Ratings of Perceived Exertion 11-13     Progression   Progression Continue to  progress workloads to maintain intensity without signs/symptoms of physical distress.     Resistance Training   Training Prescription Yes   Weight 3   Reps 10-15      Perform Capillary Blood Glucose checks as needed.  Exercise Prescription Changes:     Exercise Prescription Changes    Row Name 06/07/16 1300 06/21/16 1300 07/05/16 1500 07/19/16 1400 08/02/16 1400     Exercise Review   Progression  -- Yes Yes Yes Yes     Response to Exercise   Blood Pressure (Admit) 126/66 130/64 146/82 150/86 140/80   Blood Pressure (Exercise) 192/78 174/68 182/64 178/64 164/78   Blood Pressure (Exit) 134/52 142/58 126/72 134/66 142/78   Heart Rate (Admit) 71 bpm 82 bpm 79 bpm 74 bpm 86 bpm   Heart Rate (Exercise) 111 bpm 116 bpm 110 bpm 115 bpm 123 bpm   Heart Rate (Exit) 89 bpm 86 bpm 97 bpm 79 bpm 84 bpm   Oxygen Saturation (Admit) 97 % 97 % 96 % 99 % 97 %   Oxygen Saturation (Exercise) 96 % 93 % 94 % 95 % 93 %   Oxygen Saturation (Exit) 98 % 97 % 96 % 97 % 97 %   Rating of Perceived Exertion (Exercise) _0 Perceived Dyspnea (Exercise) _1 Symptoms  -- none none none SOB on treadmill    Comments  --  --  --  -- Home Exercise Guidelines given 08/02/16   Duration Progress to 30 minutes of continuous aerobic without signs/symptoms of physical distress Progress to 45 minutes of aerobic exercise without signs/symptoms of physical distress Progress to 45 minutes of aerobic exercise without signs/symptoms of physical distress Progress to 45 minutes of  aerobic exercise without signs/symptoms of physical distress Progress to 45 minutes of aerobic exercise without signs/symptoms of physical distress   Intensity _2      Progression   Progression  -- Continue to progress workloads to maintain intensity without signs/symptoms of physical distress. Continue to progress workloads to maintain intensity without signs/symptoms of physical distress. Continue to progress workloads to maintain intensity without signs/symptoms of physical distress. Continue to progress workloads to maintain intensity without signs/symptoms of physical distress.   Average METs  -- 2 2.3  -- 2.29     Resistance Training   Training Prescription Yes Yes Yes  -- Yes   Weight _3 -- 4 lbs   Reps 10-15 10-15 10-15  -- 10-15     Interval Training   Interval Training  -- No No  -- No     Treadmill   MPH 1 1.3 1.3  -- 1.4   Grade 0 0 0  -- 0   Minutes 15  4/6/4 min work with 3 min rest _4 -- 15   METs  -- 2 2  -- 2.07     Arm Ergometer   Level  -- 3 3  -- 4   Watts  -- 18  --  --  --   Minutes  -- 10 15  -- 15   METs  -- 2.1 2.5  -- 2.3     Recumbant Elliptical   Level --  did not do - bothered knee  --  --  --  --     T5 Nustep   Level _5 --  4   Watts 30 24  --  --  --   Minutes 15  5 work 3 rest 3 times 15 15  -- 15   METs  -- 2 2.3  -- 2     Home Exercise Plan   Plans to continue exercise at  --  --  --  -- Home  walking and complex in gym   Frequency  --  --  --  -- Add 1 additional day to program exercise sessions.   [place] Name 08/16/16 1400 08/29/16 1500           Exercise Review   Progression Yes Yes        Response to Exercise   Blood Pressure (Admit) 132/74 148/78      Blood Pressure (Exercise) 164/72 160/70      Blood Pressure (Exit) 128/66 130/66      Heart Rate (Admit) 116 bpm 104 bpm      Heart Rate (Exercise) 132 bpm 127 bpm      Heart Rate  (Exit) 96 bpm 102 bpm      Oxygen Saturation (Admit) 94 % 97 %      Oxygen Saturation (Exercise) 95 % 93 %      Oxygen Saturation (Exit) 97 % 97 %      Rating of Perceived Exertion (Exercise) 13 17      Perceived Dyspnea (Exercise) 4 4      Symptoms SOB on treadmill  SOB on treadmill       Comments Home Exercise Guidelines given 08/02/16 Home Exercise Guidelines given 08/02/16      Duration Progress to 45 minutes of aerobic exercise without signs/symptoms of physical distress Progress to 45 minutes of aerobic exercise without signs/symptoms of physical distress      Intensity THRR unchanged THRR unchanged        Progression   Progression Continue to progress workloads to maintain intensity without signs/symptoms of physical distress. Continue to progress workloads to maintain intensity without signs/symptoms of physical distress.      Average METs 2.66 3.16        Resistance Training   Training Prescription Yes Yes      Weight 4 lbs 4 lbs      Reps 10-15 10-15        Interval Training   Interval Training No No        Treadmill   MPH 1.4 1.4      Grade 0 0.5      Minutes 15 15      METs 2.07 2.17        Arm Ergometer   Level 5 8      Watts --  50 rpm --  42 rpm      Minutes 15 15      METs 3.2 4.1        T5 Nustep   Level 5 5      Minutes 15 15      METs 2.7 3.2        Home Exercise Plan   Plans to continue exercise at Home  walking and complex in gym Home      Frequency Add 1 additional day to program exercise sessions. Add 2 additional days to program exercise sessions.         Exercise Comments:     Exercise Comments    Row Name 06/07/16 1311 06/21/16 1310 07/05/16 1527 07/19/16 1427 08/02/16 1115   Exercise Comments showed Mr  Kenyon a hamstring stretch to do during rest from TM as his hamstrings get really tight walking Mitsuru is off to a good start!  He has already moved up to level 3 on the Arm Ergometer.  He is making good progress and we will continue to  monitor for progression.  He did stop using the recumbent elliptical as it hurt his knees. Niv has been doing well with exercise. He has continued to make progress on his workloads. Daymeon is doing well with exercise.  He is up to 15 min continuous on the treadmill now!  We will continue to monitor for progresion Reviewed home exercise with pt today.  Pt plans to walk at home and go to gym in complex for exercise.  Reviewed THR, pulse, RPE, sign and symptoms, and when to call 911 or MD.  Also discussed weather considerations and indoor options.  Pt voiced understanding.   [place] Name 08/02/16 1413 08/16/16 1432 08/29/16 1537       Exercise Comments Marquarius is doing well with exercise.  He has just bumped up his treadmill to 1.4 mph.  We will continue to monitor for progression. Leng continues to do well with exercise.  He is now up to level 5 on the arm crank.  We will continue to monitor for progression.  Trinity is doing well with exercise.  He was officially under 300 lbs at last visit!!!!!  He is up to level 8 on the arm crank and level 7 on the T5 stepper!!  We will continue to monitor for progression.  He is scheduled to do his post 6 min walk test at his next visit.        Discharge Exercise Prescription (Final Exercise Prescription Changes):     Exercise Prescription Changes - 08/29/16 1500      Exercise Review   Progression Yes     Response to Exercise   Blood Pressure (Admit) 148/78   Blood Pressure (Exercise) 160/70   Blood Pressure (Exit) 130/66   Heart Rate (Admit) 104 bpm   Heart Rate (Exercise) 127 bpm   Heart Rate (Exit) 102 bpm   Oxygen Saturation (Admit) 97 %   Oxygen Saturation (Exercise) 93 %   Oxygen Saturation (Exit) 97 %   Rating of Perceived Exertion (Exercise) 17   Perceived Dyspnea (Exercise) 4   Symptoms SOB on treadmill    Comments Home Exercise Guidelines given 08/02/16   Duration Progress to 45 minutes of aerobic exercise without signs/symptoms of physical  distress   Intensity THRR unchanged     Progression   Progression Continue to progress workloads to maintain intensity without signs/symptoms of physical distress.   Average METs 3.16     Resistance Training   Training Prescription Yes   Weight 4 lbs   Reps 10-15     Interval Training   Interval Training No     Treadmill   MPH 1.4   Grade 0.5   Minutes 15   METs 2.17     Arm Ergometer   Level 8   Watts --  42 rpm   Minutes 15   METs 4.1     T5 Nustep   Level 5   Minutes 15   METs 3.2     Home Exercise Plan   Plans to continue exercise at Home   Frequency Add 2 additional days to program exercise sessions.       Nutrition:  Target Goals: Understanding of nutrition guidelines, daily intake of sodium <  1519m, cholesterol <2061m calories 30% from fat and 7% or less from saturated fats, daily to have 5 or more servings of fruits and vegetables.  Biometrics:    Nutrition Therapy Plan and Nutrition Goals:     Nutrition Therapy & Goals - 05/30/16 0900      Nutrition Therapy   Diet Mr LaGoheenould like to consult with the dietitian. He does all the cooking, cooks green vegtables,low salt, and likes sweets.       Nutrition Discharge: Rate Your Plate Scores:   Psychosocial: Target Goals: Acknowledge presence or absence of depression, maximize coping skills, provide positive support system. Participant is able to verbalize types and ability to use techniques and skills needed for reducing stress and depression.  Initial Review & Psychosocial Screening:     Initial Psych Review & Screening - 05/30/16 0900      Initial Review   Current issues with History of Depression     Family Dynamics   Good Support System? Yes   Comments Good support from his family; long history of manic depression; has been admitted to an institution for 8 months, 8 years ago - it was successful therapy and is not on medication at this time.     Barriers   Psychosocial barriers to  participate in program The patient should benefit from training in stress management and relaxation.     Screening Interventions   Interventions Encouraged to exercise;Program counselor consult      Quality of Life Scores:     Quality of Life - 05/30/16 0900      Quality of Life Scores   Health/Function Pre 20.25 %   Socioeconomic Pre 19.33 %   Psych/Spiritual Pre 19.33 %   Family Pre 20.6 %   GLOBAL Pre 19.97 %      PHQ-9: Recent Review Flowsheet Data    [facility]   Decreased Interest 3   Down, Depressed, Hopeless 2   PHQ - 2 Score 5   Altered sleeping 3   Tired, decreased energy 3   Change in appetite 1   Feeling bad or failure about yourself  1   Trouble concentrating 0   Moving slowly or fidgety/restless 0   Suicidal thoughts 0   PHQ-9 Score 13   Difficult doing work/chores Somewhat difficult      Psychosocial Evaluation and Intervention:     Psychosocial Evaluation - 06/14/16 1137      Psychosocial Evaluation & Interventions   Interventions Encouraged to exercise with the program and follow exercise prescription;Stress management education;Relaxation education   Comments Counselor met with Mr. LaLeadbetteroday for initial psychosocial evaluation.  He is a 89ear old gentleman who has COPD and struggles with Sleep Apnea; Obesity and a history of "manic/depression" (Bi-Polar Disorder).  He states his support system is strong although he lives alone and reports his daughter is his primary support person - who lives 30 minutes away.  He admits to a history of "manic/Depression" and reports not being on any meds for the past 2 years.  He [place] having [place] at one point for alcohol and mood problems and is now able to recognize and cope with his triggers and mood issues.  Mr. LaBonsalleports his mood currently is a "7"  on a scale of 1-10 with 10 being best.  He admits to his health being his primary stressor  currently.  Mr. LaKendallas goals to lose weight,  move and breathe better; walk better and increase his energy in general while in this program.  Counselor asked Mr. Ayre if he would benefit from a Counselor for his mood  diagnosis currently and he declined, but stated he generally enjoys group therapy.  Counselor will look for groups locally that take his insurance to help meet this request.  Counselor will follow with Mr. Gersten throughout the course of this program.     Continued Psychosocial Services Needed Yes  Mr. Vannice will benefit from all of the psychoeducational components of this program, as well as meeting with the dietician to address his weight loss goals.       Psychosocial Re-Evaluation:     Psychosocial Re-Evaluation    [place] Name 06/28/16 1229 07/31/16 1123 08/23/16 1220         Psychosocial Re-Evaluation   Comments Counselor follow up with Mr. Crisco today reporting he is walking better and able to tie his shoes now since coming into this exercise program.  He continues to struggle with shortness of breath and sleep disturbance.  Counselor suggested Mr. Varnell speak with his Dr. or pharmacist about a possible OTC sleep aid that is sustained release to help with this issue.  Counselor will continue to follow with Mr. Pickrel. Counselor follow up with Mr. Enyeart reporting increased energy and flexibility since coming into this program.  He states his mood continues to be a problem and it is "up and down" but mostly "down."  However, Mr. Ryant states he "won't take meds for this" since he was on so many at one time.  Counselor encouraged him to speak with his Dr. about trying just one/day medication for mood to help stabilize this.  He is scheduled to see the Dr. tomorrow about his hamstrings that he complains having been hurting a great deal lately.  Counselor will continue to follow and commended Mr. Hartt on the progress made so far.   Counselor follow up with Mr. Punt today reporting he has lost more  weight and was so excited about this.  He states that this program is great as he feels healthier coming here.  Counselor commended Mr. Welcher for all of his hard work and progress, particularly in his weight loss.  Mr. Cornelio continues to report sleep disturbance and has recently had a depressive episolde.  Counselor encouraged Mr. Mapel to see his Dr. about possible medications to address both of these issues.  He states he is scheduled to see his Dr. tomorrow.  Counselor also got verbal permission from Mr. Rosero to speak with his daughter who was present today about these concerns and she agreed to mention these to the Dr. as she accompanies him to that appointment tomorrow.  Counselor will continue to follow with Mr. Halt as he is scheduled to discharge soon and has applied to attend the [employer] program on site at [facility].         Education: Education Goals: Education classes will be provided on a weekly basis, covering required topics. Participant will state understanding/return demonstration of topics presented.  Learning Barriers/Preferences:     Learning Barriers/Preferences - 05/30/16 0900      Learning Barriers/Preferences   Learning Barriers None   Learning Preferences Group Instruction;Individual Instruction;Pictoral;Skilled Demonstration;Verbal Instruction;Video;Written Material      Education Topics: Initial Evaluation Education: - Verbal, written and demonstration of respiratory meds, RPE/PD scales, oximetry and breathing techniques. Instruction on use of nebulizers and MDIs: cleaning and proper use, rinsing mouth with  steroid doses and importance of monitoring MDI activations. Flowsheet Row Pulmonary Rehab from 08/23/2016 in [facility] Cardiac and Pulmonary Rehab  Date  05/30/16  Educator  LB  Instruction Review Code  2- meets goals/outcomes      General Nutrition Guidelines/Fats and Fiber: -Group instruction provided by verbal, written material, models and posters to present the  general guidelines for heart healthy nutrition. Gives an explanation and review of dietary fats and fiber. Flowsheet Row Pulmonary Rehab from 08/23/2016 in [facility] Cardiac and Pulmonary Rehab  Date  07/10/16  Educator  CR  Instruction Review Code  2- meets goals/outcomes      Controlling Sodium/Reading Food Labels: -Group verbal and written material supporting the discussion of sodium use in heart healthy nutrition. Review and explanation with models, verbal and written materials for utilization of the food label. Flowsheet Row Pulmonary Rehab from 08/23/2016 in [facility] Cardiac and Pulmonary Rehab  Date  07/17/16  Educator  CR  Instruction Review Code  2- meets goals/outcomes      Exercise Physiology & Risk Factors: - Group verbal and written instruction with models to review the exercise physiology of the cardiovascular system and associated critical values. Details cardiovascular disease risk factors and the goals associated with each risk factor. Flowsheet Row Pulmonary Rehab from 08/23/2016 in [facility] Cardiac and Pulmonary Rehab  Date  08/09/16  Educator  AS  Instruction Review Code  2- meets goals/outcomes      Aerobic Exercise & Resistance Training: - Gives group verbal and written discussion on the health impact of inactivity. On the components of aerobic and resistive training programs and the benefits of this training and how to safely progress through these programs.   Flexibility, Balance, General Exercise Guidelines: - Provides group verbal and written instruction on the benefits of flexibility and balance training programs. Provides general exercise guidelines with specific guidelines to those with heart or lung disease. Demonstration and skill practice provided. Flowsheet Row Pulmonary Rehab from 08/23/2016 in [facility] Cardiac and Pulmonary Rehab  Date  07/12/16  Educator  AS  Instruction Review Code  2- meets goals/outcomes      Stress Management: - Provides group verbal and written  instruction about the health risks of elevated stress, cause of high stress, and healthy ways to reduce stress.   Depression: - Provides group verbal and written instruction on the correlation between heart/lung disease and depressed mood, treatment options, and the stigmas associated with seeking treatment. Flowsheet Row Pulmonary Rehab from 08/23/2016 in [facility] Cardiac and Pulmonary Rehab  Date  08/23/16  Educator  [facility]  Instruction Review Code  2- meets goals/outcomes      Exercise & Equipment Safety: - Individual verbal instruction and demonstration of equipment use and safety with use of the equipment.   Infection Prevention: - Provides verbal and written material to individual with discussion of infection control including proper hand washing and proper equipment cleaning during exercise session. Flowsheet Row Pulmonary Rehab from 08/23/2016 in [facility] Cardiac and Pulmonary Rehab  Date  05/30/16  Educator  LB  Instruction Review Code  2- meets goals/outcomes      Falls Prevention: - Provides verbal and written material to individual with discussion of falls prevention and safety. Flowsheet Row Pulmonary Rehab from 08/23/2016 in [facility] Cardiac and Pulmonary Rehab  Date  05/30/16  Educator  LB  Instruction Review Code  2- meets goals/outcomes      Diabetes: - Individual verbal and written instruction to review signs/symptoms of diabetes, desired ranges of glucose level  fasting, after meals and with exercise. Advice that pre and post exercise glucose checks will be done for 3 sessions at entry of program.   Chronic Lung Diseases: - Group verbal and written instruction to review new updates, new respiratory medications, new advancements in procedures and treatments. Provide informative websites and "800" numbers of self-education. Flowsheet Row Pulmonary Rehab from 08/23/2016 in [name] Cardiac and Pulmonary Rehab  Date  07/03/16  Educator  LB  Instruction Review Code  2- meets  goals/outcomes      Lung Procedures: - Group verbal and written instruction to describe testing methods done to diagnose lung disease. Review the outcome of test results. Describe the treatment choices: Pulmonary Function Tests, ABGs and oximetry. Flowsheet Row Pulmonary Rehab from 08/23/2016 in [facility] Cardiac and Pulmonary Rehab  Date  07/07/16  Educator  [place]  Instruction Review Code  2- meets goals/outcomes      Energy Conservation: - Provide group verbal and written instruction for methods to conserve energy, plan and organize activities. Instruct on pacing techniques, use of adaptive equipment and posture/positioning to relieve shortness of breath.   Triggers: - Group verbal and written instruction to review types of environmental controls: home humidity, furnaces, filters, dust mite/pet prevention, HEPA vacuums. To discuss weather changes, air quality and the benefits of nasal washing.   Exacerbations: - Group verbal and written instruction to provide: warning signs, infection symptoms, calling MD promptly, preventive modes, and value of vaccinations. Review: effective airway clearance, coughing and/or vibration techniques. Create an Sports administrator. Flowsheet Row Pulmonary Rehab from 08/23/2016 in [facility] Cardiac and Pulmonary Rehab  Date  06/12/16  Educator  LB  Instruction Review Code  2- meets goals/outcomes      Oxygen: - Individual and group verbal and written instruction on oxygen therapy. Includes supplement oxygen, available portable oxygen systems, continuous and intermittent flow rates, oxygen safety, concentrators, and Medicare reimbursement for oxygen.   Respiratory Medications: - Group verbal and written instruction to review medications for lung disease. Drug class, frequency, complications, importance of spacers, rinsing mouth after steroid MDI's, and proper cleaning methods for nebulizers. Flowsheet Row Pulmonary Rehab from 08/23/2016 in [facility] Cardiac and Pulmonary Rehab   Date  05/30/16  Educator  LB  Instruction Review Code  2- meets goals/outcomes      AED/CPR: - Group verbal and written instruction with the use of models to demonstrate the basic use of the AED with the basic ABC's of resuscitation. Flowsheet Row Pulmonary Rehab from 08/23/2016 in [facility] Cardiac and Pulmonary Rehab  Date  07/21/16  Educator  CE  Instruction Review Code  2- meets goals/outcomes      Breathing Retraining: - Provides individuals verbal and written instruction on purpose, frequency, and proper technique of diaphragmatic breathing and pursed-lipped breathing. Applies individual practice skills. Flowsheet Row Pulmonary Rehab from 08/23/2016 in [facility] Cardiac and Pulmonary Rehab  Date  05/30/16  Educator  LB  Instruction Review Code  2- meets goals/outcomes      Anatomy and Physiology of the Lungs: - Group verbal and written instruction with the use of models to provide basic lung anatomy and physiology related to function, structure and complications of lung disease.   Heart Failure: - Group verbal and written instruction on the basics of heart failure: signs/symptoms, treatments, explanation of ejection fraction, enlarged heart and cardiomyopathy. Flowsheet Row Pulmonary Rehab from 08/23/2016 in [facility] Cardiac and Pulmonary Rehab  Date  08/18/16  Educator  CE  Instruction Review Code  2- meets goals/outcomes  Sleep Apnea: - Individual verbal and written instruction to review Obstructive Sleep Apnea. Review of risk factors, methods for diagnosing and types of masks and machines for OSA. Flowsheet Row Pulmonary Rehab from 08/23/2016 in [facility] Cardiac and Pulmonary Rehab  Date  05/30/16  Educator  LB  Instruction Review Code  2- meets goals/outcomes      Anxiety: - Provides group, verbal and written instruction on the correlation between heart/lung disease and anxiety, treatment options, and management of anxiety. Flowsheet Row Pulmonary Rehab from 08/23/2016 in [facility]  Cardiac and Pulmonary Rehab  Date  06/28/16  Educator  [facility]  Instruction Review Code  2- Meets goals/outcomes      Relaxation: - Provides group, verbal and written instruction about the benefits of relaxation for patients with heart/lung disease. Also provides patients with examples of relaxation techniques.   Knowledge Questionnaire Score:     Knowledge Questionnaire Score - 05/30/16 0900      Knowledge Questionnaire Score   Pre Score 9/10       Core Components/Risk Factors/Patient Goals at Admission:     Personal Goals and Risk Factors at Admission - 08/18/16 1057      Core Components/Risk Factors/Patient Goals on Admission    Weight Management Weight Loss   Intervention Weight Management: Develop a combined nutrition and exercise program designed to reach desired caloric intake, while maintaining appropriate intake of nutrient and fiber, sodium and fats, and appropriate energy expenditure required for the weight goal.      Core Components/Risk Factors/Patient Goals Review:      Goals and Risk Factor Review    Row Name 06/07/16 1315 06/12/16 1000 06/16/16 1043 07/03/16 1532 07/05/16 1120     Core Components/Risk Factors/Patient Goals Review   Personal Goals Review Develop more efficient breathing techniques such as purse lipped breathing and diaphragmatic breathing and practicing self-pacing with activity. Increase knowledge of respiratory medications and ability to use respiratory devices properly. Weight Management/Obesity;Hypertension Sedentary;Increase Strength and Stamina;Improve shortness of breath with ADL's;Increase knowledge of respiratory medications and ability to use respiratory devices properly.;Develop more efficient breathing techniques such as purse lipped breathing and diaphragmatic breathing and practicing self-pacing with activity. Sedentary;Increase Strength and Stamina;Improve shortness of breath with ADL's   Review reviewed PLB and patient verbally stated  understanding Discussed with Mr Castrillon about his 2 inhalers. He has a good understanding of his Advair - rinses his mouth out after use. He rarely uses his Albuterol inhaler. He also is very compliant with his CPAP and knows how it has improved his health. weight up 4 lbs and Instructed Tupac to call his MD's office. He takes a fluid pill every other day.  Rolf says he really wants to lose weight. Blood pressure is good.  Mr Elpers has increased his exercise goals and is now walking 21mns straight on the treadmill. He states he can now keep his apartment clean - doing the mopping and other chores at home. He uses PLB with this activiy. He is very compliant with his CPAP and Inhalers , although he rarely uses his Albuterol. HJosephusreports he notices more flexibility and can move a lot better.  His hamstrings are not nearly as tight as before.  He also notices more energy to do cleaning and moving around whil eat home.   Expected Outcomes  -- Continue to be compliant with his CPAP and AdvairMDI. Weight decrease.   -- HStanwill continue to see improvements in his ADLs and overall fitness with contuned participation.  Cameron Name 07/10/16 1143 07/12/16 1322 08/07/16 1052 08/08/16 0813 08/18/16 1057     Core Components/Risk Factors/Patient Goals Review   Personal Goals Review Increase Strength and Stamina;Improve shortness of breath with ADL's;Sedentary Sedentary Increase Strength and Stamina;Sedentary  -- Weight Management/Obesity   Review Dayvian reported that he is not currently doing home exercises, but he is [place] coming to class to exercise. He has noticed improvements in strength and stamina during ADL's.  Howars stes his hamstrings are still tight but not as bad as when he began the program.  He sits most of the time at home, but will try leaving his water bottle in the kitchen so he has to get up and move to get it.  Kevontae was going to check into the fitness complex where he lives this weekend but  had a depressive episode this weekend and was not able to leave the house to check into this option. He did state that he was noticing improvements in strength and stamina.  Mr Cueto has a good understanding of his MDI's and is compliant with the medications. He uses PLB with good technique with his exercise goals and activities around the house. Since he has started [facility], Mr Schar has more energy and less shortness of breath. Aulden reports that the doctor started him on a diet pill last week so Aksh hopes to lose weight. Bryse said he quit smoking 1 year ago and his weight went up. Garlan said the doctor didn't say anything about his blood pressure. Upon arrival today. 1146.8   Expected Outcomes Antjuan would continue to consistantly attend class and use breathing techniques to manage SOB in order to facilitate progression with his workloads to help with strength and stamina.  More daily physical activity will help Harold's overall fitness and he will see continues improvement in hamstring flexibility. Celester would be able to check into the fitness complex at his residence to see if the equipment would be suitable for him to use on his days off of pulmonary rehab.   -- Cont to be healthy or healthier.   Little Browning Name 08/23/16 1532             Core Components/Risk Factors/Patient Goals Review   Personal Goals Review Weight Management/Obesity       Review Mr Kendell's weight was 299lbs today. He was very pleased with his weight loss. He continues to eat salads and vegetables and drinks only water and milk.          Core Components/Risk Factors/Patient Goals at Discharge (Final Review):      Goals and Risk Factor Review - 08/23/16 1532      Core Components/Risk Factors/Patient Goals Review   Personal Goals Review Weight Management/Obesity   Review Mr Edrian's weight was 299lbs today. He was very pleased with his weight loss. He continues to eat salads and vegetables and drinks only water and milk.       ITP Comments:     ITP Comments    Row Name 06/16/16 1043 08/18/16 1056 09/06/16 1448       ITP Comments weight up 4 lbs and Instructed Arlin to call his MD's office. He takes a fluid pill every other day.  Graysin reports that the doctor started him on a diet pill last week so Ramzi hopes to lose weight. Amyr said he quit smoking 1 year ago and his weight went up.  [name] called to let us know that Juanmiguel had been out because he was in  the hospital with his kidneys.  He was discharged yesterday.  She is going to call to get clearance to return to rehab and talk to her Dad about whether he wants to finish out or come to [facility].        Comments: 30 day note review

## 2016-09-29 ENCOUNTER — Telehealth: Payer: Self-pay

## 2016-09-29 NOTE — Telephone Encounter (Signed)
Sees Dr next Tuesday and will let us know when he can return.

## 2016-10-03 ENCOUNTER — Encounter: Payer: Self-pay | Admitting: Family

## 2016-10-03 ENCOUNTER — Ambulatory Visit: Payer: Medicare Other | Attending: Family | Admitting: Family

## 2016-10-03 VITALS — BP 125/60 | HR 56 | Resp 20 | Ht 67.0 in | Wt 290.0 lb

## 2016-10-03 DIAGNOSIS — Z955 Presence of coronary angioplasty implant and graft: Secondary | ICD-10-CM | POA: Diagnosis not present

## 2016-10-03 DIAGNOSIS — I10 Essential (primary) hypertension: Secondary | ICD-10-CM

## 2016-10-03 DIAGNOSIS — Z888 Allergy status to other drugs, medicaments and biological substances status: Secondary | ICD-10-CM | POA: Insufficient documentation

## 2016-10-03 DIAGNOSIS — Z87891 Personal history of nicotine dependence: Secondary | ICD-10-CM | POA: Diagnosis not present

## 2016-10-03 DIAGNOSIS — J449 Chronic obstructive pulmonary disease, unspecified: Secondary | ICD-10-CM | POA: Diagnosis not present

## 2016-10-03 DIAGNOSIS — Z7951 Long term (current) use of inhaled steroids: Secondary | ICD-10-CM | POA: Diagnosis not present

## 2016-10-03 DIAGNOSIS — Z7982 Long term (current) use of aspirin: Secondary | ICD-10-CM | POA: Insufficient documentation

## 2016-10-03 DIAGNOSIS — G4733 Obstructive sleep apnea (adult) (pediatric): Secondary | ICD-10-CM | POA: Diagnosis not present

## 2016-10-03 DIAGNOSIS — M1990 Unspecified osteoarthritis, unspecified site: Secondary | ICD-10-CM | POA: Insufficient documentation

## 2016-10-03 DIAGNOSIS — Z79899 Other long term (current) drug therapy: Secondary | ICD-10-CM | POA: Diagnosis not present

## 2016-10-03 DIAGNOSIS — E785 Hyperlipidemia, unspecified: Secondary | ICD-10-CM | POA: Insufficient documentation

## 2016-10-03 DIAGNOSIS — I509 Heart failure, unspecified: Secondary | ICD-10-CM | POA: Insufficient documentation

## 2016-10-03 DIAGNOSIS — Z8249 Family history of ischemic heart disease and other diseases of the circulatory system: Secondary | ICD-10-CM | POA: Insufficient documentation

## 2016-10-03 DIAGNOSIS — Z8546 Personal history of malignant neoplasm of prostate: Secondary | ICD-10-CM | POA: Diagnosis not present

## 2016-10-03 DIAGNOSIS — I110 Hypertensive heart disease with heart failure: Secondary | ICD-10-CM | POA: Insufficient documentation

## 2016-10-03 DIAGNOSIS — I5022 Chronic systolic (congestive) heart failure: Secondary | ICD-10-CM

## 2016-10-03 DIAGNOSIS — I2510 Atherosclerotic heart disease of native coronary artery without angina pectoris: Secondary | ICD-10-CM | POA: Diagnosis not present

## 2016-10-03 DIAGNOSIS — I739 Peripheral vascular disease, unspecified: Secondary | ICD-10-CM | POA: Diagnosis not present

## 2016-10-03 NOTE — Progress Notes (Signed)
Subjective:    Patient ID: [name], male    DOB: 02/09/1935, 81 y.o.   MRN: [id number]  HPI  Mr Swatzell is a 81 y/o male with a history of PVD, prostate cancer, HTN, hyperlipidemia, degenerative joint disease, CAD, COPD, bipolar, asthma, previous alcohol use and prior tobacco use.   Last echo was done 09/11/16 which showed an EF of 40-45% with mild/moderate MR and mild/moderate TR. Last cardiac cath was done in 2005 and he had an angioplasty with a drug-eluting stent placed in the right coronary artery.   Was admitted on 09/11/16 with an exacerbation of heart failure. Cardiology was consulted. He was treated with IV diuretics and discharged home. Admission prior to that was 08/31/16 for abdominal pain due to gastritis. Symptoms improved with IV fluids and medications.   He presents today for his initial visit with fatigue and shortness of breath with exertion. Denies any swelling in his legs/abdomen. Is weighing himself daily and says that his weight has been stable. Does have Holly coming out 2-3 times/week. Not adding salt to his food. Does have difficulty sleeping at night. Wears CPAP nightly.   Past Medical History:  Diagnosis Date  . Alcoholism ([place])   . Asthma   . Bipolar affective disorder ([place])   . COPD (chronic obstructive pulmonary disease) ([facility])    COPD  . Coronary artery disease February 2005   PCI and Taxus drug-eluting stent placement to the distal RCA (3.5 x 12 mm)  . Degenerative joint disease   . Essential hypertension   . Hyperlipidemia   . Hypertension   . Prostate CA [facility])    prostate ca dx 64 ys ago;  . Prostate CA [facility])    prostate ca dx 20 yrs ago  . PVD (peripheral vascular disease) ([place])   . Tobacco abuse     Past Surgical History:  Procedure Laterality Date  . CARDIAC CATHETERIZATION    . CORONARY ANGIOPLASTY WITH STENT PLACEMENT    . TOTAL HIP ARTHROPLASTY     right    Family History  Problem Relation Age of Onset  .  Hypertension Mother   . Hyperlipidemia Mother   . Heart attack Mother   . Hypertension Father   . Hyperlipidemia Father   . Heart attack Father     Social History  Substance Use Topics  . Smoking status: Former Smoker    Packs/day: 1.00    Years: 50.00    Types: Cigarettes  . Smokeless tobacco: Never Used  . Alcohol use Yes     Comment: social     Allergies  Allergen Reactions  . Amlodipine     Constipation   . Benazepril-Hydrochlorothiazide     Constipation   . Chlorthalidone     Hyponatremia  . Other Other (See Comments)    "ANY BLOOD PRESSURE MEDICATIONS THAT I'VE TRIED" - PT. DOES NOT REMEMBER WHICH ONES    Prior to Admission medications   Medication Sig Start Date End Date Taking? Authorizing Provider  albuterol (PROAIR HFA) 108 (90 Base) MCG/ACT inhaler Inhale 2 puffs into the lungs every 6 (six) hours as needed for wheezing or shortness of breath. Reported on 04/21/2016 01/21/16 01/20/17 Yes Historical Provider, MD  aspirin EC 81 MG tablet Take 81 mg by mouth every morning.   Yes Historical Provider, MD  docusate sodium (COLACE) 50 MG capsule Take 50 mg by mouth 2 (two) times daily.   Yes Historical Provider, MD  famotidine (PEPCID) 20 MG  tablet Take 1 tablet (20 mg total) by mouth daily. 09/07/16  Yes Epifanio Lesches, MD  fluticasone-salmeterol (ADVAIR HFA) 633-35 MCG/ACT inhaler Inhale 2 puffs into the lungs 2 (two) times daily.   Yes Historical Provider, MD  furosemide (LASIX) 40 MG tablet Take 0.5 tablets (20 mg total) by mouth 2 (two) times daily. 09/13/16  Yes Dustin Flock, MD  hydrALAZINE (APRESOLINE) 25 MG tablet Take 2 tablets (50 mg total) by mouth every 8 (eight) hours. 09/13/16  Yes Dustin Flock, MD  HYDROcodone-acetaminophen (NORCO/VICODIN) 5-325 MG tablet Take 1-2 tablets by mouth every 6 (six) hours as needed for moderate pain. 01/29/16  Yes Johnn Hai, PA-C  losartan (COZAAR) 50 MG tablet Take 2 tablets (100 mg total) by mouth daily. 09/13/16  Yes  Dustin Flock, MD  magnesium oxide (MAG-OX) 400 MG tablet Take 400 mg by mouth daily.   Yes Historical Provider, MD  metoprolol (LOPRESSOR) 50 MG tablet Take 1 tablet (50 mg total) by mouth 2 (two) times daily. 09/06/16  Yes Epifanio Lesches, MD  nitroGLYCERIN (NITROSTAT) 0.4 MG SL tablet Take 0.4 mg by mouth every 5 (five) minutes x 3 doses as needed for chest pain. As needed for chest pain 12/06/15  Yes Historical Provider, MD  tiotropium (SPIRIVA) 18 MCG inhalation capsule Place 18 mcg into inhaler and inhale daily.   Yes Historical Provider, MD     Review of Systems  Constitutional: Positive for appetite change and fatigue.  HENT: Negative for congestion, postnasal drip and sore throat.   Eyes: Negative.   Respiratory: Positive for shortness of breath. Negative for cough and chest tightness.   Cardiovascular: Negative for chest pain, palpitations and leg swelling.  Gastrointestinal: Negative for abdominal distention and abdominal pain.  Endocrine: Negative.   Genitourinary: Negative.   Musculoskeletal: Negative for back pain and neck pain.  Skin: Negative.   Allergic/Immunologic: Negative.   Neurological: Negative for dizziness and light-headedness.       Muscle spasms behind lower legs; intermittent   Hematological: Negative for adenopathy. Does not bruise/bleed easily.  Psychiatric/Behavioral: Positive for dysphoric mood and sleep disturbance (trouble sleeping; wearing CPAP nightly). The patient is not nervous/anxious.    Vitals:   10/03/16 1140  BP: 125/60  Pulse: (!) 56  Resp: 20  SpO2: 97%  Weight: 290 lb (131.5 kg)  Height: 5\' 7"  (1.702 m)       Objective:   Physical Exam  Constitutional: He is oriented to person, place, and time. He appears well-developed and well-nourished.  HENT:  Head: Normocephalic and atraumatic.  Eyes: Conjunctivae are normal. Pupils are equal, round, and reactive to light.  Neck: Normal range of motion. Neck supple.  Cardiovascular:  Regular rhythm.  Bradycardia present.   Pulmonary/Chest: Effort normal. He has no wheezes. He has no rales.  Abdominal: Soft. He exhibits no distension. There is no tenderness.  Musculoskeletal: He exhibits no edema or tenderness.  Neurological: He is alert and oriented to person, place, and time.  Skin: Skin is warm and dry.  Psychiatric: He has a normal mood and affect. His behavior is normal. Thought content normal.  Nursing note and vitals reviewed.         Assessment & Plan:  1: Chronic heart failure with reduced ejection fraction- - NHYA class II - euvolemic - continue weighing daily and call for an overnight weight gain of >2 pounds or a weekly weight gain of >5 pounds. Scales were given during last hospital admission - already received flu vaccine for  this season - may need to decrease metoprolol if HR remains low. Review of last few days of home health record shows heart rate in the 60's - needs OV with cardiologist Fletcher Anon). Patient's daughter says that she will call to schedule.  - could consider switching losartan to entresto although he's leary due to reactions to previous BP medications  2: HTN- - BP looks good today  3: COPD- - follows with pulmonologist Raul Del). Was last seen 08/10/16 - Last PFT's done December 2016 - continues to use his inhalers - going to try and resume pulmonary rehab  4: Obstructive sleep apnea- - Wearing CPAP nightly - having difficulty staying asleep. To talk with PCP about this.  Return here in 1 month or sooner for any questions/problems before then.

## 2016-10-03 NOTE — Patient Instructions (Signed)
Continue weighing daily and call for an overnight weight gain of > 2 pounds or a weekly weight gain of >5 pounds. 

## 2016-10-04 DIAGNOSIS — G4733 Obstructive sleep apnea (adult) (pediatric): Secondary | ICD-10-CM | POA: Insufficient documentation

## 2016-10-04 DIAGNOSIS — J449 Chronic obstructive pulmonary disease, unspecified: Secondary | ICD-10-CM | POA: Insufficient documentation

## 2016-10-04 DIAGNOSIS — I5023 Acute on chronic systolic (congestive) heart failure: Secondary | ICD-10-CM | POA: Insufficient documentation

## 2016-10-16 ENCOUNTER — Encounter: Payer: Self-pay | Admitting: *Deleted

## 2016-10-16 DIAGNOSIS — G4733 Obstructive sleep apnea (adult) (pediatric): Secondary | ICD-10-CM

## 2016-10-16 DIAGNOSIS — J449 Chronic obstructive pulmonary disease, unspecified: Secondary | ICD-10-CM

## 2016-10-16 NOTE — Progress Notes (Signed)
Pulmonary Individual Treatment Plan  Patient Details  [name]: [name] MRN: [id number] Date of Birth: [birth year] Referring Provider:   Flowsheet Row Pulmonary Rehab from 05/30/2016 in [facility] Cardiac and Pulmonary Rehab  Referring Provider  [name]      Initial Encounter Date:  Flowsheet Row Pulmonary Rehab from 05/30/2016 in [facility] Cardiac and Pulmonary Rehab  Date  05/30/16  Referring Provider  [name]      Visit Diagnosis: Obstructive sleep apnea syndrome  Morbid obesity due to excess calories (HCC)  COPD, mild ([place])  Patient's Home Medications on Admission:  Current Outpatient Prescriptions:  .  albuterol (PROAIR HFA) 108 (90 Base) MCG/ACT inhaler, Inhale 2 puffs into the lungs every 6 (six) hours as needed for wheezing or shortness of breath. Reported on 04/21/2016, Disp: , Rfl:  .  aspirin EC 81 MG tablet, Take 81 mg by mouth every morning., Disp: , Rfl:  .  docusate sodium (COLACE) 50 MG capsule, Take 50 mg by mouth 2 (two) times daily., Disp: , Rfl:  .  famotidine (PEPCID) 20 MG tablet, Take 1 tablet (20 mg total) by mouth daily., Disp: 30 tablet, Rfl: 0 .  fluticasone-salmeterol (ADVAIR HFA) 115-21 MCG/ACT inhaler, Inhale 2 puffs into the lungs 2 (two) times daily., Disp: , Rfl:  .  furosemide (LASIX) 40 MG tablet, Take 0.5 tablets (20 mg total) by mouth 2 (two) times daily., Disp: 60 tablet, Rfl: 0 .  hydrALAZINE (APRESOLINE) 25 MG tablet, Take 2 tablets (50 mg total) by mouth every 8 (eight) hours., Disp: 60 tablet, Rfl: 0 .  HYDROcodone-acetaminophen (NORCO/VICODIN) 5-325 MG tablet, Take 1-2 tablets by mouth every 6 (six) hours as needed for moderate pain., Disp: 30 tablet, Rfl: 0 .  losartan (COZAAR) 50 MG tablet, Take 2 tablets (100 mg total) by mouth daily., Disp: 90 tablet, Rfl: 0 .  magnesium oxide (MAG-OX) 400 MG tablet, Take 400 mg by mouth daily., Disp: , Rfl:  .  metoprolol (LOPRESSOR) 50 MG tablet, Take 1 tablet (50 mg total) by mouth 2 (two) times daily., Disp: 60  tablet, Rfl: 0 .  nitroGLYCERIN (NITROSTAT) 0.4 MG SL tablet, Take 0.4 mg by mouth every 5 (five) minutes x 3 doses as needed for chest pain. As needed for chest pain, Disp: , Rfl:  .  tiotropium (SPIRIVA) 18 MCG inhalation capsule, Place 18 mcg into inhaler and inhale daily., Disp: , Rfl:   Past Medical History: Past Medical History:  Diagnosis Date  . Alcoholism ([place])   . Asthma   . Bipolar affective disorder ([place])   . COPD (chronic obstructive pulmonary disease) (HCC)    COPD  . Coronary artery disease February 2005   PCI and Taxus drug-eluting stent placement to the distal RCA (3.5 x 12 mm)  . Degenerative joint disease   . Essential hypertension   . Hyperlipidemia   . Hypertension   . Prostate CA [facility])    prostate ca dx 75 ys ago;  . Prostate CA [facility])    prostate ca dx 20 yrs ago  . PVD (peripheral vascular disease) ([place])   . Tobacco abuse     Tobacco Use: History  Smoking Status  . Former Smoker  . Packs/day: 1.00  . Years: 50.00  . Types: Cigarettes  Smokeless Tobacco  . Never Used    Labs: Recent Review Flowsheet Data    Labs for ITP Cardiac and Pulmonary Rehab Latest Ref Rng & Units 10/30/2010 01/04/2011 05/31/2014 03/02/2016 04/26/2016   Cholestrol 100 - 199 mg/dL  161 ATP III CLASSIFICATION: <200     mg/dL   Desirable 200-239  mg/dL   Borderline High >=240    mg/dL   High - 138 218(H) 189   LDLCALC 0 - 99 mg/dL 78 Total Cholesterol/HDL:CHD Risk Coronary Heart Disease Risk Table Men   Women 1/2 Average Risk   3.4   3.3 Average Risk       5.0   4.4 2 X Average Risk   9.6   7.1 3 X Average Risk  23.4   11.0 Use the calculated Patient Ratio above and the CHD Risk Table to determine the patient's CHD Risk. ATP III CLASSIFICATION (LDL): <100     mg/dL   Optimal 100-129  mg/dL   Near or Above Optimal 130-159  mg/dL   Borderline 160-189  mg/dL   High >190     mg/dL   Very High - 83 135(H) 97   HDL >39 mg/dL 71 - 40 58 66   Trlycerides 0 - 149 mg/dL 60 -  75 124 129   Hemoglobin A1c 4.6 - 6.1 % - - - - -   TCO2 0 - 100 mmol/L - 26 - - -       ADL UCSD:     Pulmonary Assessment Scores    Row [name] 05/30/16 0900 07/14/16 1118       ADL UCSD   ADL Phase Entry  -    SOB Score total 49 66    Rest 1 1    Walk 3 3    Stairs 4 4    Bath 2 4    Dress 2 3    Shop 3 4       Pulmonary Function Assessment:     Pulmonary Function Assessment - 05/30/16 0900      Pulmonary Function Tests   FVC% 87 %   FEV1% 83 %   FEV1/FVC Ratio 73   RV% 80 %   DLCO% 60 %     Initial Spirometry Results   Comments PFT performed on 12/16/2015     Breath   Bilateral Breath Sounds Clear   Shortness of Breath Yes;Fear of Shortness of Breath;Limiting activity      Exercise Target Goals:    Exercise Program Goal: Individual exercise prescription set with THRR, safety & activity barriers. Participant demonstrates ability to understand and report RPE using BORG scale, to self-measure pulse accurately, and to acknowledge the importance of the exercise prescription.  Exercise Prescription Goal: Starting with aerobic activity 30 plus minutes a day, 3 days per week for initial exercise prescription. Provide home exercise prescription and guidelines that participant acknowledges understanding prior to discharge.  Activity Barriers & Risk Stratification:     Activity Barriers & Cardiac Risk Stratification - 05/30/16 0900      Activity Barriers & Cardiac Risk Stratification   Activity Barriers Shortness of Breath;Deconditioning;Muscular Weakness   Cardiac Risk Stratification Moderate      6 Minute Walk:     6 Minute Walk    Row [name] 05/30/16 1535 07/14/16 1058       6 Minute Walk   Phase  - (P)  Mid Program    Distance 533 feet (P)  780 feet    Walk Time 4 minutes (P)  6 minutes    # of Rest Breaks 1  stopped at 4:00 (P)  0    MPH 1.5  -    METS 2.1  -    RPE 18 (P)  13    Perceived Dyspnea   - (P)  5    Symptoms Yes (comment) (P)   No    Comments overall fatigue - legs and breathing  -    Resting HR 67 bpm (P)  81 bpm    Resting BP 154/70 (P)  160/78    Max Ex. HR 123 bpm (P)  130 bpm    Max Ex. BP 160/70 (P)  230/80    2 Minute Post BP 154/72 (P)  170/80      Interval HR   Baseline HR 67 (P)  81    1 Minute HR 105  -    2 Minute HR 108  -    3 Minute HR 117 (P)  128    4 Minute HR 123  -    2 Minute Post HR 72  -    Interval Heart Rate? Yes  -      Interval Oxygen   Interval Oxygen? Yes  -    Baseline Oxygen Saturation % 98 % (P)  98 %    Baseline Liters of Oxygen  - (P)  -  Room Air    1 Minute Oxygen Saturation % 96 %  -    2 Minute Oxygen Saturation % 96 %  -    3 Minute Oxygen Saturation % 93 % (P)  90 %    4 Minute Oxygen Saturation % 93 %  -    6 Minute Oxygen Saturation %  - (P)  89 %  Room air    6 Minute Liters of Oxygen  - (P)  -  Room Air    2 Minute Post Oxygen Saturation % 98 % (P)  99 %  Room Air       Initial Exercise Prescription:     Initial Exercise Prescription - 05/30/16 1500      Date of Initial Exercise RX and Referring Provider   Date 05/30/16   Referring Provider [name]     Treadmill   MPH 1.3   Grade 0   Minutes 15  4/4/4   METs 2     Recumbant Elliptical   Level 1   Watts 50   Minutes 15  can do 4/4/4    METs 2.1     T5 Nustep   Level 1   Watts 30   Minutes 15  4/4/4   METs 2.1     Prescription Details   Frequency (times per week) 3   Duration Progress to 45 minutes of aerobic exercise without signs/symptoms of physical distress     Intensity   THRR 40-80% of Max Heartrate 97-126   Ratings of Perceived Exertion 11-13     Progression   Progression Continue to progress workloads to maintain intensity without signs/symptoms of physical distress.     Resistance Training   Training Prescription Yes   Weight 3   Reps 10-15      Perform Capillary Blood Glucose checks as needed.  Exercise Prescription Changes:     Exercise Prescription  Changes    Row [name] 06/07/16 1300 06/21/16 1300 07/05/16 1500 07/19/16 1400 08/02/16 1400     Exercise Review   Progression  - Yes Yes Yes Yes     Response to Exercise   Blood Pressure (Admit) 126/66 130/64 146/82 150/86 140/80   Blood Pressure (Exercise) 192/78 174/68 182/64 178/64 164/78   Blood Pressure (Exit) 134/52 142/58 126/72 134/66 142/78   Heart Rate (Admit) 71  bpm 82 bpm 79 bpm 74 bpm 86 bpm   Heart Rate (Exercise) 111 bpm 116 bpm 110 bpm 115 bpm 123 bpm   Heart Rate (Exit) 89 bpm 86 bpm 97 bpm 79 bpm 84 bpm   Oxygen Saturation (Admit) 97 % 97 % 96 % 99 % 97 %   Oxygen Saturation (Exercise) 96 % 93 % 94 % 95 % 93 %   Oxygen Saturation (Exit) 98 % 97 % 96 % 97 % 97 %   Rating of Perceived Exertion (Exercise) _0 Perceived Dyspnea (Exercise) _1 Symptoms  - none none none SOB on treadmill    Comments  -  -  -  - Home Exercise Guidelines given 08/02/16   Duration Progress to 30 minutes of continuous aerobic without signs/symptoms of physical distress Progress to 45 minutes of aerobic exercise without signs/symptoms of physical distress Progress to 45 minutes of aerobic exercise without signs/symptoms of physical distress Progress to 45 minutes of aerobic exercise without signs/symptoms of physical distress Progress to 45 minutes of aerobic exercise without signs/symptoms of physical distress   Intensity _2      Progression   Progression  - Continue to progress workloads to maintain intensity without signs/symptoms of physical distress. Continue to progress workloads to maintain intensity without signs/symptoms of physical distress. Continue to progress workloads to maintain intensity without signs/symptoms of physical distress. Continue to progress workloads to maintain intensity without signs/symptoms of physical distress.   Average METs  - 2 2.3  - 2.29     Resistance Training   Training  Prescription Yes Yes Yes  - Yes   Weight _3 - 4 lbs   Reps 10-15 10-15 10-15  - 10-15     Interval Training   Interval Training  - No No  - No     Treadmill   MPH 1 1.3 1.3  - 1.4   Grade 0 0 0  - 0   Minutes 15  4/6/4 min work with 3 min rest _4 - 15   METs  - 2 2  - 2.07     Arm Ergometer   Level  - 3 3  - 4   Watts  - 18  -  -  -   Minutes  - 10 15  - 15   METs  - 2.1 2.5  - 2.3     Recumbant Elliptical   Level -  did not do - bothered knee  -  -  -  -     T5 Nustep   Level _5 - 4   Watts 30 24  -  -  -   Minutes 15  5 work 3 rest 3 times 15 15  - 15   METs  - 2 2.3  - 2     Home Exercise Plan   Plans to continue exercise at  -  -  -  - Home  walking and complex in gym   Frequency  -  -  -  - Add 1 additional day to program exercise sessions.   [name] 08/16/16 1400 08/29/16 1500           Exercise Review   Progression Yes Yes        Response to Exercise  Blood Pressure (Admit) 132/74 148/78      Blood Pressure (Exercise) 164/72 160/70      Blood Pressure (Exit) 128/66 130/66      Heart Rate (Admit) 116 bpm 104 bpm      Heart Rate (Exercise) 132 bpm 127 bpm      Heart Rate (Exit) 96 bpm 102 bpm      Oxygen Saturation (Admit) 94 % 97 %      Oxygen Saturation (Exercise) 95 % 93 %      Oxygen Saturation (Exit) 97 % 97 %      Rating of Perceived Exertion (Exercise) 13 17      Perceived Dyspnea (Exercise) 4 4      Symptoms SOB on treadmill  SOB on treadmill       Comments Home Exercise Guidelines given 08/02/16 Home Exercise Guidelines given 08/02/16      Duration Progress to 45 minutes of aerobic exercise without signs/symptoms of physical distress Progress to 45 minutes of aerobic exercise without signs/symptoms of physical distress      Intensity THRR unchanged THRR unchanged        Progression   Progression Continue to progress workloads to maintain intensity without signs/symptoms of physical distress. Continue to progress  workloads to maintain intensity without signs/symptoms of physical distress.      Average METs 2.66 3.16        Resistance Training   Training Prescription Yes Yes      Weight 4 lbs 4 lbs      Reps 10-15 10-15        Interval Training   Interval Training No No        Treadmill   MPH 1.4 1.4      Grade 0 0.5      Minutes 15 15      METs 2.07 2.17        Arm Ergometer   Level 5 8      Watts -  50 rpm -  42 rpm      Minutes 15 15      METs 3.2 4.1        T5 Nustep   Level 5 5      Minutes 15 15      METs 2.7 3.2        Home Exercise Plan   Plans to continue exercise at Home  walking and complex in gym Home      Frequency Add 1 additional day to program exercise sessions. Add 2 additional days to program exercise sessions.         Exercise Comments:     Exercise Comments    Row [name] 06/07/16 1311 06/21/16 1310 07/05/16 1527 07/19/16 1427 08/02/16 1115   Exercise Comments showed [name] a hamstring stretch to do during rest from TM as his hamstrings get really tight walking [name] is off to a good start!  He has already moved up to level 3 on the Arm Ergometer.  He is making good progress and we will continue to monitor for progression.  He did stop using the recumbent elliptical as it hurt his knees. [name] has been doing well with exercise. He has continued to make progress on his workloads. [name] is doing well with exercise.  He is up to 15 min continuous on the treadmill now!  We will continue to monitor for progresion Nicholas home exercise with [name] today.  [name] plans to walk at home and go to gym  in complex for exercise.  Nicholas THR, pulse, RPE, sign and symptoms, and when to call 911 or [name].  Also discussed weather considerations and indoor options.  [name] voiced understanding.   [name] 08/02/16 1413 08/16/16 1432 08/29/16 1537 10/12/16 1156     Exercise Comments Ulyses is doing well with exercise.  He has just bumped up his treadmill to 1.4 mph.  We will continue to  monitor for progression. Jartavious continues to do well with exercise.  He is now up to level 5 on the arm crank.  We will continue to monitor for progression.  Masami is doing well with exercise.  He was officially under 300 lbs at last visit!!!!!  He is up to level 8 on the arm crank and level 7 on the T5 stepper!!  We will continue to monitor for progression.  He is scheduled to do his post 6 min walk test at his next visit. Nicholas Milliron hasn't attended since 08/23/16 due to kidney problems.  He saw his Dr 10/13.  He completed 31 sessions of [facility] and it is not known if he will return.       Discharge Exercise Prescription (Final Exercise Prescription Changes):     Exercise Prescription Changes - 08/29/16 1500      Exercise Review   Progression Yes     Response to Exercise   Blood Pressure (Admit) 148/78   Blood Pressure (Exercise) 160/70   Blood Pressure (Exit) 130/66   Heart Rate (Admit) 104 bpm   Heart Rate (Exercise) 127 bpm   Heart Rate (Exit) 102 bpm   Oxygen Saturation (Admit) 97 %   Oxygen Saturation (Exercise) 93 %   Oxygen Saturation (Exit) 97 %   Rating of Perceived Exertion (Exercise) 17   Perceived Dyspnea (Exercise) 4   Symptoms SOB on treadmill    Comments Home Exercise Guidelines given 08/02/16   Duration Progress to 45 minutes of aerobic exercise without signs/symptoms of physical distress   Intensity THRR unchanged     Progression   Progression Continue to progress workloads to maintain intensity without signs/symptoms of physical distress.   Average METs 3.16     Resistance Training   Training Prescription Yes   Weight 4 lbs   Reps 10-15     Interval Training   Interval Training No     Treadmill   MPH 1.4   Grade 0.5   Minutes 15   METs 2.17     Arm Ergometer   Level 8   Watts --  42 rpm   Minutes 15   METs 4.1     T5 Nustep   Level 5   Minutes 15   METs 3.2     Home Exercise Plan   Plans to continue exercise at Home   Frequency Add 2  additional days to program exercise sessions.       Nutrition:  Target Goals: Understanding of nutrition guidelines, daily intake of sodium <1564m, cholesterol <2068m calories 30% from fat and 7% or less from saturated fats, daily to have 5 or more servings of fruits and vegetables.  Biometrics:    Nutrition Therapy Plan and Nutrition Goals:     Nutrition Therapy & Goals - 05/30/16 0900      Nutrition Therapy   Diet [name] like to consult with the dietitian. He does all the cooking, cooks green vegtables,low salt, and likes sweets.       Nutrition Discharge: Rate Your Plate Scores:  Psychosocial: Target Goals: Acknowledge presence or absence of depression, maximize coping skills, provide positive support system. Participant is able to verbalize types and ability to use techniques and skills needed for reducing stress and depression.  Initial Review & Psychosocial Screening:     Initial Psych Review & Screening - 05/30/16 0900      Initial Review   Current issues with History of Depression     Family Dynamics   Good Support System? Yes   Comments Good support from his family; long history of manic depression; has been admitted to an institution for 8 months, 8 years ago - it was successful therapy and is not on medication at this time.     Barriers   Psychosocial barriers to participate in program The patient should benefit from training in stress management and relaxation.     Screening Interventions   Interventions Encouraged to exercise;Program counselor consult      Quality of Life Scores:     Quality of Life - 05/30/16 0900      Quality of Life Scores   Health/Function Pre 20.25 %   Socioeconomic Pre 19.33 %   Psych/Spiritual Pre 19.33 %   Family Pre 20.6 %   GLOBAL Pre 19.97 %      PHQ-9: Recent Review Flowsheet Data    Depression screen [facility] 2/9 10/03/2016 05/30/2016   Decreased Interest - 3   Down, Depressed, Hopeless 2 2   PHQ - 2  Score 2 5   Altered sleeping - 3   Tired, decreased energy - 3   Change in appetite - 1   Feeling bad or failure about yourself  - 1   Trouble concentrating - 0   Moving slowly or fidgety/restless - 0   Suicidal thoughts 0 0   PHQ-9 Score - 13   Difficult doing work/chores - Somewhat difficult      Psychosocial Evaluation and Intervention:     Psychosocial Evaluation - 06/14/16 1137      Psychosocial Evaluation & Interventions   Interventions Encouraged to exercise with the program and follow exercise prescription;Stress management education;Relaxation education   Comments Counselor met with Nicholas. Katich today for initial psychosocial evaluation.  He is a 81 year old gentleman who has COPD and struggles with Sleep Apnea; Obesity and a history of "manic/depression" (Bi-Polar Disorder).  He states his support system is strong although he lives alone and reports his daughter is his primary support person - who lives 30 minutes away.  He admits to a history of "manic/Depression" and reports not being on any meds for the past 2 years.  He reports having been in treatment in [place] for 9 months at one point for alcohol and mood problems and is now able to recognize and cope with his triggers and mood issues.  Nicholas. Grosser reports his mood currently is a "7"  on a scale of 1-10 with 10 being best.  He admits to his health being his primary stressor currently.  Nicholas. Collingsworth has goals to lose weight, move and breathe better; walk better and increase his energy in general while in this program.  Counselor asked Nicholas. Locklin if he would benefit from a Counselor for his mood  diagnosis currently and he declined, but stated he generally enjoys group therapy.  Counselor will look for groups locally that take his insurance to help meet this request.  Counselor will follow with Nicholas. Trusty throughout the course of this program.     Continued Psychosocial Services  Needed Yes  Nicholas. Hove will benefit from all of the  psychoeducational components of this program, as well as meeting with the dietician to address his weight loss goals.       Psychosocial Re-Evaluation:     Psychosocial Re-Evaluation    [name] 06/28/16 1229 07/31/16 1123 08/23/16 1220         Psychosocial Re-Evaluation   Comments Counselor follow up with Nicholas. Mckoy today reporting he is walking better and able to tie his shoes now since coming into this exercise program.  He continues to struggle with shortness of breath and sleep disturbance.  Counselor suggested Nicholas. Eaddy speak with his Dr. or pharmacist about a possible OTC sleep aid that is sustained release to help with this issue.  Counselor will continue to follow with Nicholas. Harton. Counselor follow up with Nicholas. Pile reporting increased energy and flexibility since coming into this program.  He states his mood continues to be a problem and it is "up and down" but mostly "down."  However, Nicholas. Parcell states he "won't take meds for this" since he was on so many at one time.  Counselor encouraged him to speak with his Dr. about trying just one/day medication for mood to help stabilize this.  He is scheduled to see the Dr. tomorrow about his hamstrings that he complains having been hurting a great deal lately.  Counselor will continue to follow and commended Nicholas. Minerd on the progress made so far.   Counselor follow up with Nicholas. Montour today reporting he has lost more weight and was so excited about this.  He states that this program is great as he feels healthier coming here.  Counselor commended Nicholas. Quirino for all of his hard work and progress, particularly in his weight loss.  Nicholas. Klemp continues to report sleep disturbance and has recently had a depressive episolde.  Counselor encouraged Nicholas. Manganaro to see his Dr. about possible medications to address both of these issues.  He states he is scheduled to see his Dr. tomorrow.  Counselor also got verbal permission from Nicholas. Diemer to speak with his daughter who was  present today about these concerns and she agreed to mention these to the Dr. as she accompanies him to that appointment tomorrow.  Counselor will continue to follow with Nicholas. Kader as he is scheduled to discharge soon and has applied to attend the [employer] program on site at [facility].         Education: Education Goals: Education classes will be provided on a weekly basis, covering required topics. Participant will state understanding/return demonstration of topics presented.  Learning Barriers/Preferences:     Learning Barriers/Preferences - 05/30/16 0900      Learning Barriers/Preferences   Learning Barriers None   Learning Preferences Group Instruction;Individual Instruction;Pictoral;Skilled Demonstration;Verbal Instruction;Video;Written Material      Education Topics: Initial Evaluation Education: - Verbal, written and demonstration of respiratory meds, RPE/PD scales, oximetry and breathing techniques. Instruction on use of nebulizers and MDIs: cleaning and proper use, rinsing mouth with steroid doses and importance of monitoring MDI activations. Flowsheet Row Pulmonary Rehab from 08/23/2016 in [facility] Cardiac and Pulmonary Rehab  Date  05/30/16  Educator  LB  Instruction Review Code  2- meets goals/outcomes      General Nutrition Guidelines/Fats and Fiber: -Group instruction provided by verbal, written material, models and posters to present the general guidelines for heart healthy nutrition. Gives an explanation and review of dietary fats and fiber. Flowsheet Row Pulmonary Rehab  from 08/23/2016 in [facility] Cardiac and Pulmonary Rehab  Date  07/10/16  Educator  CR  Instruction Review Code  2- meets goals/outcomes      Controlling Sodium/Reading Food Labels: -Group verbal and written material supporting the discussion of sodium use in heart healthy nutrition. Review and explanation with models, verbal and written materials for utilization of the food label. Flowsheet Row Pulmonary  Rehab from 08/23/2016 in [facility] Cardiac and Pulmonary Rehab  Date  07/17/16  Educator  CR  Instruction Review Code  2- meets goals/outcomes      Exercise Physiology & Risk Factors: - Group verbal and written instruction with models to review the exercise physiology of the cardiovascular system and associated critical values. Details cardiovascular disease risk factors and the goals associated with each risk factor. Flowsheet Row Pulmonary Rehab from 08/23/2016 in [facility] Cardiac and Pulmonary Rehab  Date  08/09/16  Educator  AS  Instruction Review Code  2- meets goals/outcomes      Aerobic Exercise & Resistance Training: - Gives group verbal and written discussion on the health impact of inactivity. On the components of aerobic and resistive training programs and the benefits of this training and how to safely progress through these programs.   Flexibility, Balance, General Exercise Guidelines: - Provides group verbal and written instruction on the benefits of flexibility and balance training programs. Provides general exercise guidelines with specific guidelines to those with heart or lung disease. Demonstration and skill practice provided. Flowsheet [facility] from 08/23/2016 in [facility] Cardiac and Pulmonary Rehab  Date  07/12/16  Educator  AS  Instruction Review Code  2- meets goals/outcomes      Stress Management: - Provides group verbal and written instruction about the health risks of elevated stress, cause of high stress, and healthy ways to reduce stress.   Depression: - Provides group verbal and written instruction on the correlation between heart/lung disease and depressed mood, treatment options, and the stigmas associated with seeking treatment. Flowsheet Row Pulmonary Rehab from 08/23/2016 in [facility] Cardiac and Pulmonary Rehab  Date  08/23/16  Educator  [facility]  Instruction Review Code  2- meets goals/outcomes      Exercise & Equipment Safety: - Individual verbal instruction  and demonstration of equipment use and safety with use of the equipment.   Infection Prevention: - Provides verbal and written material to individual with discussion of infection control including proper hand washing and proper equipment cleaning during exercise session. Flowsheet Row Pulmonary Rehab from 08/23/2016 in [facility] Cardiac and Pulmonary Rehab  Date  05/30/16  Educator  LB  Instruction Review Code  2- meets goals/outcomes      Falls Prevention: - Provides verbal and written material to individual with discussion of falls prevention and safety. Flowsheet Row Pulmonary Rehab from 08/23/2016 in [facility] Cardiac and Pulmonary Rehab  Date  05/30/16  Educator  LB  Instruction Review Code  2- meets goals/outcomes      Diabetes: - Individual verbal and written instruction to review signs/symptoms of diabetes, desired ranges of glucose level fasting, after meals and with exercise. Advice that pre and post exercise glucose checks will be done for 3 sessions at entry of program.   Chronic Lung Diseases: - Group verbal and written instruction to review new updates, new respiratory medications, new advancements in procedures and treatments. Provide informative websites and "800" numbers of self-education. Flowsheet Row Pul[facility] Rehab from 08/23/2016 in [facility] Cardiac and Pulmonary Rehab  Date  07/03/16  Educator  LB  Instruction Review  Code  2- meets goals/outcomes      Lung Procedures: - Group verbal and written instruction to describe testing methods done to diagnose lung disease. Review the outcome of test results. Describe the treatment choices: Pulmonary Function Tests, ABGs and oximetry. Flowsheet Row Pulmonary Rehab from 08/23/2016 in [facility] Cardiac and Pulmonary Rehab  Date  07/07/16  Educator  [place]  Instruction Review Code  2- meets goals/outcomes      Energy Conservation: - Provide group verbal and written instruction for methods to conserve energy, plan and organize activities.  Instruct on pacing techniques, use of adaptive equipment and posture/positioning to relieve shortness of breath.   Triggers: - Group verbal and written instruction to review types of environmental controls: home humidity, furnaces, filters, dust mite/pet prevention, HEPA vacuums. To discuss weather changes, air quality and the benefits of nasal washing.   Exacerbations: - Group verbal and written instruction to provide: warning signs, infection symptoms, calling [name] promptly, preventive modes, and value of vaccinations. Review: effective airway clearance, coughing and/or vibration techniques. Create an Sports administrator. Flowsheet Row Pulmonary Rehab from 08/23/2016 in [facility] Cardiac and Pulmonary Rehab  Date  06/12/16  Educator  LB  Instruction Review Code  2- meets goals/outcomes      Oxygen: - Individual and group verbal and written instruction on oxygen therapy. Includes supplement oxygen, available portable oxygen systems, continuous and intermittent flow rates, oxygen safety, concentrators, and Medicare reimbursement for oxygen.   Respiratory Medications: - Group verbal and written instruction to review medications for lung disease. Drug class, frequency, complications, importance of spacers, rinsing mouth after steroid MDI's, and proper cleaning methods for nebulizers. Flowsheet Row Pulmonary Rehab from 08/23/2016 in [facility] Cardiac and Pulmonary Rehab  Date  05/30/16  Educator  LB  Instruction Review Code  2- meets goals/outcomes      AED/CPR: - Group verbal and written instruction with the use of models to demonstrate the basic use of the AED with the basic ABC's of resuscitation. Flowsheet Row Pulmonary Rehab from 08/23/2016 in [facility] Cardiac and Pulmonary Rehab  Date  07/21/16  Educator  CE  Instruction Review Code  2- meets goals/outcomes      Breathing Retraining: - Provides individuals verbal and written instruction on purpose, frequency, and proper technique of diaphragmatic  breathing and pursed-lipped breathing. Applies individual practice skills. Flowsheet Row Pulmonary Rehab from 08/23/2016 in [facility] Cardiac and Pulmonary Rehab  Date  05/30/16  Educator  LB  Instruction Review Code  2- meets goals/outcomes      Anatomy and Physiology of the Lungs: - Group verbal and written instruction with the use of models to provide basic lung anatomy and physiology related to function, structure and complications of lung disease.   Heart Failure: - Group verbal and written instruction on the basics of heart failure: signs/symptoms, treatments, explanation of ejection fraction, enlarged heart and cardiomyopathy. Flowsheet Row Pulmonary Rehab from 08/23/2016 in [facility] Cardiac and Pulmonary Rehab  Date  08/18/16  Educator  CE  Instruction Review Code  2- meets goals/outcomes      Sleep Apnea: - Individual verbal and written instruction to review Obstructive Sleep Apnea. Review of risk factors, methods for diagnosing and types of masks and machines for OSA. Flowsheet Row Pulmonary Rehab from 08/23/2016 in [facility] Cardiac and Pulmonary Rehab  Date  05/30/16  Educator  LB  Instruction Review Code  2- meets goals/outcomes      Anxiety: - Provides group, verbal and written instruction on the correlation between heart/lung disease and  anxiety, treatment options, and management of anxiety. Flowsheet Row Pulmonary Rehab from 08/23/2016 in [facility] Cardiac and Pulmonary Rehab  Date  06/28/16  Educator  [facility]  Instruction Review Code  2- Meets goals/outcomes      Relaxation: - Provides group, verbal and written instruction about the benefits of relaxation for patients with heart/lung disease. Also provides patients with examples of relaxation techniques.   Knowledge Questionnaire Score:     Knowledge Questionnaire Score - 05/30/16 0900      Knowledge Questionnaire Score   Pre Score 9/10       Core Components/Risk Factors/Patient Goals at Admission:     Personal Goals and Risk  Factors at Admission - 08/18/16 1057      Core Components/Risk Factors/Patient Goals on Admission    Weight Management Weight Loss   Intervention Weight Management: Develop a combined nutrition and exercise program designed to reach desired caloric intake, while maintaining appropriate intake of nutrient and fiber, sodium and fats, and appropriate energy expenditure required for the weight goal.      Core Components/Risk Factors/Patient Goals Review:      Goals and Risk Factor Review    Row [name] 06/07/16 1315 06/12/16 1000 06/16/16 1043 07/03/16 1532 07/05/16 1120     Core Components/Risk Factors/Patient Goals Review   Personal Goals Review Develop more efficient breathing techniques such as purse lipped breathing and diaphragmatic breathing and practicing self-pacing with activity. Increase knowledge of respiratory medications and ability to use respiratory devices properly. Weight Management/Obesity;Hypertension Sedentary;Increase Strength and Stamina;Improve shortness of breath with ADL's;Increase knowledge of respiratory medications and ability to use respiratory devices properly.;Develop more efficient breathing techniques such as purse lipped breathing and diaphragmatic breathing and practicing self-pacing with activity. Sedentary;Increase Strength and Stamina;Improve shortness of breath with ADL's   Review Nicholas PLB and patient verbally stated understanding Discussed with Nicholas Navarrete about his 2 inhalers. He has a good understanding of his Advair - rinses his mouth out after use. He rarely uses his Albuterol inhaler. He also is very compliant with his CPAP and knows how it has improved his health. weight up 4 lbs and Instructed [name] to call his [name]'s office. He takes a fluid pill every other day.  [name] says he really wants to lose weight. Blood pressure is good.  Nicholas Bones has increased his exercise goals and is now walking 56mns straight on the treadmill. He states he can now keep his  apartment clean - doing the mopping and other chores at home. He uses PLB with this activiy. He is very compliant with his CPAP and Inhalers , although he rarely uses his Albuterol. [name] he notices more flexibility and can move a lot better.  His hamstrings are not nearly as tight as before.  He also notices more energy to do cleaning and moving around whil eat home.   Expected Outcomes  - Continue to be compliant with his CPAP and AdvairMDI. Weight decrease.   -Nicholas Isadorewill continue to see improvements in his ADLs and overall fitness with contuned participation.   [name] 07/10/16 1143 07/12/16 1322 08/07/16 1052 08/08/16 0813 08/18/16 1057     Core Components/Risk Factors/Patient Goals Review   Personal Goals Review Increase Strength and Stamina;Improve shortness of breath with ADL's;Sedentary Sedentary Increase Strength and Stamina;Sedentary  - Weight Management/Obesity   Review HEnglishreported that he is not currently doing home exercises, but he is [name] to class to exercise. He has noticed improvements in strength and stamina during ADL's.  [name] stes his hamstrings are still tight but not as bad as when he began the program.  He sits most of the time at home, but will try leaving his water bottle in the kitchen so he has to get up and move to get it.  [name] was going to check into the fitness complex where he lives this weekend but had a depressive episode this weekend and was not able to leave the house to check into this option. He did state that he was noticing improvements in strength and stamina.  [name] has a good understanding of his MDI's and is compliant with the medications. He uses PLB with good technique with his exercise goals and activities around the house. Since he has started [facility], Nicholas Azzarello has more energy and less shortness of breath. Voshon reports that the doctor started him on a diet pill last week so [name] hopes to lose weight. [name] said he quit  smoking 1 year ago and his weight went up. Sophia said the doctor didn't say anything about his blood pressure. Upon arrival today. 1146.8   Expected Outcomes Bow would continue to consistantly attend class and use breathing techniques to manage SOB in order to facilitate progression with his workloads to help with strength and stamina.  More daily physical activity will help Harold's overall fitness and he will see continues improvement in hamstring flexibility. Zoran would be able to check into the fitness complex at his residence to see if the equipment would be suitable for him to use on his days off of pulmonary rehab.   - Cont to be healthy or healthier.   [place] [name] 08/23/16 1532             Core Components/Risk Factors/Patient Goals Review   Personal Goals Review Weight Management/Obesity       Review Nicholas Weston's weight was 299lbs today. He was very pleased with his weight loss. He continues to eat salads and vegetables and drinks only water and milk.          Core Components/Risk Factors/Patient Goals at Discharge (Final Review):      Goals and Risk Factor Review - 08/23/16 1532      Core Components/Risk Factors/Patient Goals Review   Personal Goals Review Weight Management/Obesity   Review Nicholas Jaishaun's weight was 299lbs today. He was very pleased with his weight loss. He continues to eat salads and vegetables and drinks only water and milk.      ITP Comments:     ITP Comments    Row [name] 06/16/16 1043 08/18/16 1056 09/06/16 1448 10/12/16 1158     ITP Comments weight up 4 lbs and Instructed Leonce to call his [name]'s office. He takes a fluid pill every other day.  Keita reports that the doctor started him on a diet pill last week so Milferd hopes to lose weight. Eagan said he quit smoking 1 year ago and his weight went up.  [name] called to let us know that Maxim had been out because he was in the hospital with his kidneys.  He was discharged yesterday.  She is going to call  to get clearance to return to rehab and talk to her Dad about whether he wants to finish out or come to [facility]. Nicholas Feldner hasn't attended since 08/23/16 due to kidney problems.  He saw his Dr 10/13.  He completed 31 sessions of [facility] and it is not known if he will return.  Comments: 30 Day Review

## 2016-10-24 ENCOUNTER — Encounter: Payer: Self-pay | Admitting: Respiratory Therapy

## 2016-10-24 ENCOUNTER — Telehealth: Payer: Self-pay | Admitting: Respiratory Therapy

## 2016-10-24 DIAGNOSIS — J449 Chronic obstructive pulmonary disease, unspecified: Secondary | ICD-10-CM

## 2016-10-24 DIAGNOSIS — G4733 Obstructive sleep apnea (adult) (pediatric): Secondary | ICD-10-CM

## 2016-10-30 ENCOUNTER — Ambulatory Visit: Payer: Medicare Other | Attending: Family | Admitting: Family

## 2016-10-30 ENCOUNTER — Telehealth: Payer: Self-pay | Admitting: Family

## 2016-10-30 ENCOUNTER — Encounter: Payer: Self-pay | Admitting: Family

## 2016-10-30 ENCOUNTER — Encounter (INDEPENDENT_AMBULATORY_CARE_PROVIDER_SITE_OTHER): Payer: Self-pay

## 2016-10-30 VITALS — BP 122/46 | HR 74 | Resp 18 | Ht 67.0 in | Wt 296.0 lb

## 2016-10-30 DIAGNOSIS — I10 Essential (primary) hypertension: Secondary | ICD-10-CM

## 2016-10-30 DIAGNOSIS — E785 Hyperlipidemia, unspecified: Secondary | ICD-10-CM | POA: Insufficient documentation

## 2016-10-30 DIAGNOSIS — Z8249 Family history of ischemic heart disease and other diseases of the circulatory system: Secondary | ICD-10-CM | POA: Insufficient documentation

## 2016-10-30 DIAGNOSIS — I2510 Atherosclerotic heart disease of native coronary artery without angina pectoris: Secondary | ICD-10-CM | POA: Insufficient documentation

## 2016-10-30 DIAGNOSIS — I5022 Chronic systolic (congestive) heart failure: Secondary | ICD-10-CM

## 2016-10-30 DIAGNOSIS — I110 Hypertensive heart disease with heart failure: Secondary | ICD-10-CM | POA: Insufficient documentation

## 2016-10-30 DIAGNOSIS — J4489 Other specified chronic obstructive pulmonary disease: Secondary | ICD-10-CM

## 2016-10-30 DIAGNOSIS — Z87891 Personal history of nicotine dependence: Secondary | ICD-10-CM | POA: Insufficient documentation

## 2016-10-30 DIAGNOSIS — G4733 Obstructive sleep apnea (adult) (pediatric): Secondary | ICD-10-CM

## 2016-10-30 DIAGNOSIS — M1990 Unspecified osteoarthritis, unspecified site: Secondary | ICD-10-CM | POA: Insufficient documentation

## 2016-10-30 DIAGNOSIS — F1020 Alcohol dependence, uncomplicated: Secondary | ICD-10-CM | POA: Diagnosis not present

## 2016-10-30 DIAGNOSIS — Z8546 Personal history of malignant neoplasm of prostate: Secondary | ICD-10-CM | POA: Diagnosis not present

## 2016-10-30 DIAGNOSIS — Z7982 Long term (current) use of aspirin: Secondary | ICD-10-CM | POA: Insufficient documentation

## 2016-10-30 DIAGNOSIS — I739 Peripheral vascular disease, unspecified: Secondary | ICD-10-CM | POA: Insufficient documentation

## 2016-10-30 DIAGNOSIS — J45909 Unspecified asthma, uncomplicated: Secondary | ICD-10-CM | POA: Insufficient documentation

## 2016-10-30 DIAGNOSIS — F319 Bipolar disorder, unspecified: Secondary | ICD-10-CM | POA: Insufficient documentation

## 2016-10-30 DIAGNOSIS — J449 Chronic obstructive pulmonary disease, unspecified: Secondary | ICD-10-CM | POA: Diagnosis not present

## 2016-10-30 DIAGNOSIS — F1010 Alcohol abuse, uncomplicated: Secondary | ICD-10-CM

## 2016-10-30 DIAGNOSIS — Z955 Presence of coronary angioplasty implant and graft: Secondary | ICD-10-CM | POA: Diagnosis not present

## 2016-10-30 LAB — BASIC METABOLIC PANEL
ANION GAP: 9 (ref 5–15)
BUN: 35 mg/dL — ABNORMAL HIGH (ref 6–20)
CALCIUM: 8.5 mg/dL — AB (ref 8.9–10.3)
CHLORIDE: 96 mmol/L — AB (ref 101–111)
CO2: 26 mmol/L (ref 22–32)
CREATININE: 1.97 mg/dL — AB (ref 0.61–1.24)
GFR calc non Af Amer: 31 mL/min — ABNORMAL LOW (ref >60)
GFR, EST AFRICAN AMERICAN: 35 mL/min — AB (ref >60)
Glucose, Bld: 137 mg/dL — ABNORMAL HIGH (ref 65–99)
Potassium: 4.3 mmol/L (ref 3.5–5.1)
SODIUM: 131 mmol/L — AB (ref 135–145)

## 2016-10-30 MED ORDER — HYDRALAZINE HCL 25 MG PO TABS: 50.0000 mg | ORAL_TABLET | Freq: Three times a day (TID) | ORAL | 3 refills | Status: DC

## 2016-10-30 NOTE — Telephone Encounter (Signed)
Spoke to daughter, [name], regarding patient's lab work that was drawn today (10/30/16). Potassium level is normal and renal function is stable. Sodium is a little low at 131 but looking back it's been as low as mid 120's in the hospital to upper 130's.   Patient did say that he drank an entire case of beer this weekend which could be contributing to his hyponatremia.  He sees his cardiologist in a few weeks and advised his daughter to ask them to recheck his sodium level. She could also give him a few salty potato chips today and emphasize to the patient that he doesn't need to drink any more alcohol.  [name] verbalized understanding and was appreciative of the call.

## 2016-10-30 NOTE — Patient Instructions (Signed)
Continue weighing daily and call for an overnight weight gain of > 2 pounds or a weekly weight gain of >5 pounds. 

## 2016-10-30 NOTE — Progress Notes (Signed)
Patient ID: [name], male    DOB: 27-Dec-1934, 81 y.o.   MRN: [id number]  HPI  [name] is a 81 y/o male with a history of PVD, prostate cancer, HTN, hyperlipidemia, degenerative joint disease, CAD, COPD, bipolar, asthma, previous alcohol use and prior tobacco use.   Last echo was done 09/11/16 which showed an EF of 40-45% with mild/moderate Nicholas and mild/moderate TR. Last cardiac cath was done in 2005 and he had an angioplasty with a drug-eluting stent placed in the right coronary artery.   Was admitted on 09/11/16 with an exacerbation of heart failure. Cardiology was consulted. He was treated with IV diuretics and discharged home. Admission prior to that was 08/31/16 for abdominal pain due to gastritis. Symptoms improved with IV fluids and medications.   He presents today for his follow-up visit with fatigue and shortness of breath with exertion. Denies any swelling in his legs/abdomen. Is weighing himself daily and says that his weight has been stable although did have a 6 pound overnight weight gain this weekend which resolved on its own the next day. Admits to drinking a case of beer over this weekend. Does have [name] coming out 2-3 times/week. Not adding salt to his food. Does have difficulty sleeping at night. Wears CPAP nightly.  Past Medical History:  Diagnosis Date  . Alcoholism ([place])   . Asthma   . Bipolar affective disorder ([place])   . COPD (chronic obstructive pulmonary disease) ([facility])    COPD  . Coronary artery disease February 2005   PCI and Taxus drug-eluting stent placement to the distal RCA (3.5 x 12 mm)  . Degenerative joint disease   . Essential hypertension   . Hyperlipidemia   . Hypertension   . Prostate CA [facility])    prostate ca dx 83 ys ago;  . Prostate CA [facility])    prostate ca dx 20 yrs ago  . PVD (peripheral vascular disease) ([place])   . Tobacco abuse     Past Surgical History:  Procedure Laterality Date  . CARDIAC CATHETERIZATION    . CORONARY  ANGIOPLASTY WITH STENT PLACEMENT    . TOTAL HIP ARTHROPLASTY     right    Family History  Problem Relation Age of Onset  . Hypertension Mother   . Hyperlipidemia Mother   . Heart attack Mother   . Hypertension Father   . Hyperlipidemia Father   . Heart attack Father     Social History  Substance Use Topics  . Smoking status: Former Smoker    Packs/day: 1.00    Years: 50.00    Types: Cigarettes  . Smokeless tobacco: Never Used  . Alcohol use Yes     Comment: social     Allergies  Allergen Reactions  . Amlodipine     Constipation   . Benazepril-Hydrochlorothiazide     Constipation   . Chlorthalidone     Hyponatremia  . Other Other (See Comments)    "ANY BLOOD PRESSURE MEDICATIONS THAT I'VE TRIED" - PT. DOES NOT REMEMBER WHICH ONES    Prior to Admission medications   Medication Sig Start Date End Date Taking? Authorizing Provider  albuterol (PROAIR HFA) 108 (90 Base) MCG/ACT inhaler Inhale 2 puffs into the lungs every 6 (six) hours as needed for wheezing or shortness of breath. Reported on 04/21/2016 01/21/16 01/20/17 Yes Historical Provider, MD  aspirin EC 81 MG tablet Take 81 mg by mouth every morning.   Yes Historical Provider, MD  atorvastatin (LIPITOR)  40 MG tablet Take 40 mg by mouth daily.   Yes Historical Provider, MD  famotidine (PEPCID) 20 MG tablet Take 1 tablet (20 mg total) by mouth daily. 09/07/16  Yes Epifanio Lesches, MD  fluticasone-salmeterol (ADVAIR HFA) 161-09 MCG/ACT inhaler Inhale 2 puffs into the lungs 2 (two) times daily.   Yes Historical Provider, MD  furosemide (LASIX) 40 MG tablet Take 0.5 tablets (20 mg total) by mouth 2 (two) times daily. 09/13/16  Yes Dustin Flock, MD  hydrALAZINE (APRESOLINE) 25 MG tablet Take 2 tablets (50 mg total) by mouth every 8 (eight) hours. 10/30/16  Yes Alisa Graff, FNP  levothyroxine (SYNTHROID, LEVOTHROID) 50 MCG tablet Take 50 mcg by mouth daily before breakfast.   Yes Historical Provider, MD  losartan (COZAAR)  50 MG tablet Take 2 tablets (100 mg total) by mouth daily. 09/13/16  Yes Dustin Flock, MD  magnesium oxide (MAG-OX) 400 MG tablet Take 400 mg by mouth 2 (two) times daily.    Yes Historical Provider, MD  metoprolol (LOPRESSOR) 50 MG tablet Take 1 tablet (50 mg total) by mouth 2 (two) times daily. 09/06/16  Yes Epifanio Lesches, MD  nitroGLYCERIN (NITROSTAT) 0.4 MG SL tablet Take 0.4 mg by mouth every 5 (five) minutes x 3 doses as needed for chest pain. As needed for chest pain 12/06/15  Yes Historical Provider, MD  tiotropium (SPIRIVA) 18 MCG inhalation capsule Place 18 mcg into inhaler and inhale daily.   Yes Historical Provider, MD     Review of Systems  Constitutional: Positive for fatigue. Negative for appetite change.  HENT: Positive for rhinorrhea. Negative for congestion and sore throat.   Eyes: Negative.   Respiratory: Positive for shortness of breath. Negative for cough and chest tightness.   Cardiovascular: Positive for chest pain (infrequently). Negative for palpitations and leg swelling.  Gastrointestinal: Negative for abdominal distention and abdominal pain.  Endocrine: Negative.   Genitourinary: Negative.   Musculoskeletal: Negative for back pain and neck pain.  Skin: Negative.   Allergic/Immunologic: Negative.   Neurological: Positive for light-headedness. Negative for dizziness.       Chronic leg cramps  Hematological: Negative for adenopathy. Does not bruise/bleed easily.  Psychiatric/Behavioral: Positive for dysphoric mood (with biploar). Negative for sleep disturbance (wearing CPAP nightly). The patient is not nervous/anxious.    Vitals:   10/30/16 0852  BP: (!) 122/46  Pulse: 74  Resp: 18  SpO2: 98%  Weight: 296 lb (134.3 kg)  Height: 5\' 7"  (1.702 m)    Wt Readings from Last 3 Encounters:  10/30/16 296 lb (134.3 kg)  10/03/16 290 lb (131.5 kg)  09/13/16 295 lb 8 oz (134 kg)    Lab Results  Component Value Date   CREATININE 1.99 (H) 09/13/2016    CREATININE 1.95 (H) 09/12/2016   CREATININE 1.78 (H) 09/11/2016    Physical Exam  Constitutional: He is oriented to person, place, and time. He appears well-developed and well-nourished.  HENT:  Head: Normocephalic and atraumatic.  Eyes: Conjunctivae are normal. Pupils are equal, round, and reactive to light.  Neck: Normal range of motion. Neck supple.  Cardiovascular: Normal rate and regular rhythm.   Pulmonary/Chest: Effort normal. He has no wheezes. He has no rales.  Abdominal: Soft. He exhibits no distension. There is no tenderness.  Musculoskeletal: He exhibits no edema or tenderness.  Neurological: He is alert and oriented to person, place, and time.  Skin: Skin is warm and dry.  Psychiatric: He has a normal mood and affect. His behavior  is normal. Thought content normal.  Nursing note and vitals reviewed.    Assessment & Plan:  1: Chronic heart failure with reduced ejection fraction- - NHYA class II - euvolemic - continue weighing daily and call for an overnight weight gain of >2 pounds or a weekly weight gain of >5 pounds. Weight up 6 pounds from previous visit - already received flu vaccine for this season - heart rate better today so continue metoprolol dose - has appointment on 11/23/16 with cardiologist [name]). - could consider switching losartan to entresto although he's leary due to reactions to previous BP medications - check a BMP today  2: HTN- - BP looks good today - saw PCP [name]) 10/27/16  3: COPD- - follows with pulmonologist [name]). Was last seen 10/13/16 - Last PFT's done December 2016 - continues to use his inhalers - has resumed pulmonary rehab  4: Obstructive sleep apnea- - Wearing CPAP nightly - having difficulty staying asleep. To talk with PCP about this.  5: Alcohol use- - drank 1 case of beer this past weekend - says that he didn't eat anything yesterday while drinking and did take all his medications - discussed sodium content of  beer and potential medication interactions with alcohol and not eating. This could be why he's feeling a little lightheaded today.   Return here in 1 month or sooner for any questions/problems before then.

## 2016-10-31 ENCOUNTER — Encounter: Payer: Self-pay | Admitting: Respiratory Therapy

## 2016-10-31 DIAGNOSIS — J449 Chronic obstructive pulmonary disease, unspecified: Secondary | ICD-10-CM

## 2016-10-31 DIAGNOSIS — G4733 Obstructive sleep apnea (adult) (pediatric): Secondary | ICD-10-CM

## 2016-10-31 NOTE — Patient Instructions (Signed)
Discharge Instructions  Patient Details  Name: [name] MRN: [id number] Date of Birth: March 14, 1935 Referring Provider:  No ref. provider found   Number of Visits: 30 Reason for Discharge:  Early Exit:  [name] plans to return to [employer] and will be discharged from [employer]. He is currently receiving home health therapy and will start the maintenance exercise program on completion of the home therapy.      Smoking History:  History  Smoking Status   Former Smoker   Packs/day: 1.00   Years: 50.00   Types: Cigarettes  Smokeless Tobacco   Never Used    Diagnosis:  Obstructive sleep apnea syndrome  Morbid obesity due to excess calories (HCC)  COPD, mild (HCC)  Initial Exercise Prescription:     Initial Exercise Prescription - 05/30/16 1500      Date of Initial Exercise RX and Referring Provider   Date 05/30/16   Referring Provider Raul Del     Treadmill   MPH 1.3   Grade 0   Minutes 15  4/4/4   METs 2     Recumbant Elliptical   Level 1   Watts 50   Minutes 15  can do 4/4/4    METs 2.1     T5 Nustep   Level 1   Watts 30   Minutes 15  4/4/4   METs 2.1     Prescription Details   Frequency (times per week) 3   Duration Progress to 45 minutes of aerobic exercise without signs/symptoms of physical distress     Intensity   THRR 40-80% of Max Heartrate 97-126   Ratings of Perceived Exertion 11-13     Progression   Progression Continue to progress workloads to maintain intensity without signs/symptoms of physical distress.     Resistance Training   Training Prescription Yes   Weight 3   Reps 10-15      Discharge Exercise Prescription (Final Exercise Prescription Changes):     Exercise Prescription Changes - 08/29/16 1500      Exercise Review   Progression Yes     Response to Exercise   Blood Pressure (Admit) 148/78   Blood Pressure (Exercise) 160/70   Blood Pressure (Exit) 130/66   Heart Rate (Admit) 104 bpm   Heart Rate  (Exercise) 127 bpm   Heart Rate (Exit) 102 bpm   Oxygen Saturation (Admit) 97 %   Oxygen Saturation (Exercise) 93 %   Oxygen Saturation (Exit) 97 %   Rating of Perceived Exertion (Exercise) 17   Perceived Dyspnea (Exercise) 4   Symptoms SOB on treadmill    Comments Home Exercise Guidelines given 08/02/16   Duration Progress to 45 minutes of aerobic exercise without signs/symptoms of physical distress   Intensity THRR unchanged     Progression   Progression Continue to progress workloads to maintain intensity without signs/symptoms of physical distress.   Average METs 3.16     Resistance Training   Training Prescription Yes   Weight 4 lbs   Reps 10-15     Interval Training   Interval Training No     Treadmill   MPH 1.4   Grade 0.5   Minutes 15   METs 2.17     Arm Ergometer   Level 8   Watts --  42 rpm   Minutes 15   METs 4.1     T5 Nustep   Level 5   Minutes 15   METs 3.2     Home Exercise Plan  Plans to continue exercise at Home   Frequency Add 2 additional days to program exercise sessions.      Functional Capacity:     6 Minute Walk    Row Name 05/30/16 1535 07/14/16 1058       6 Minute Walk   Phase  -- (P)  Mid Program    Distance 533 feet (P)  780 feet    Walk Time 4 minutes (P)  6 minutes    # of Rest Breaks 1  stopped at 4:00 (P)  0    MPH 1.5  --    METS 2.1  --    RPE 18 (P)  13    Perceived Dyspnea   -- (P)  5    Symptoms Yes (comment) (P)  No    Comments overall fatigue - legs and breathing  --    Resting HR 67 bpm (P)  81 bpm    Resting BP 154/70 (P)  160/78    Max Ex. HR 123 bpm (P)  130 bpm    Max Ex. BP 160/70 (P)  230/80    2 Minute Post BP 154/72 (P)  170/80      Interval HR   Baseline HR 67 (P)  81    1 Minute HR 105  --    2 Minute HR 108  --    3 Minute HR 117 (P)  128    4 Minute HR 123  --    2 Minute Post HR 72  --    Interval Heart Rate? Yes  --      Interval Oxygen   Interval Oxygen? Yes  --    Baseline Oxygen  Saturation % 98 % (P)  98 %    Baseline Liters of Oxygen  -- (P)  --  Room Air    1 Minute Oxygen Saturation % 96 %  --    2 Minute Oxygen Saturation % 96 %  --    3 Minute Oxygen Saturation % 93 % (P)  90 %    4 Minute Oxygen Saturation % 93 %  --    6 Minute Oxygen Saturation %  -- (P)  89 %  Room air    6 Minute Liters of Oxygen  -- (P)  --  Room Air    2 Minute Post Oxygen Saturation % 98 % (P)  99 %  Room Air       Quality of Life:     Quality of Life - 05/30/16 0900      Quality of Life Scores   Health/Function Pre 20.25 %   Socioeconomic Pre 19.33 %   Psych/Spiritual Pre 19.33 %   Family Pre 20.6 %   GLOBAL Pre 19.97 %      Personal Goals: Goals established at orientation with interventions provided to work toward goal.     Personal Goals and Risk Factors at Admission - 08/18/16 1057      Core Components/Risk Factors/Patient Goals on Admission    Weight Management Weight Loss   Intervention Weight Management: Develop a combined nutrition and exercise program designed to reach desired caloric intake, while maintaining appropriate intake of nutrient and fiber, sodium and fats, and appropriate energy expenditure required for the weight goal.       Personal Goals Discharge:     Goals and Risk Factor Review - 08/23/16 1532      Core Components/Risk Factors/Patient Goals Review   Personal Goals Review Weight Management/Obesity  Review [name]'s weight was 299lbs today. He was very pleased with his weight loss. He continues to eat salads and vegetables and drinks only water and milk.      Nutrition & Weight - Outcomes:    Nutrition:     Nutrition Therapy & Goals - 05/30/16 0900      Nutrition Therapy   Diet [name] would like to consult with the dietitian. He does all the cooking, cooks green vegtables,low salt, and likes sweets.       Nutrition Discharge:   Education Questionnaire Score:     Knowledge Questionnaire Score - 05/30/16 0900       Knowledge Questionnaire Score   Pre Score 9/10      Goals reviewed with patient; copy given to patient.

## 2016-10-31 NOTE — Progress Notes (Signed)
Pulmonary Individual Treatment Plan  Patient Details  Name: [name] MRN: [id number] Date of Birth: 05-13-1935 Referring Provider:   Flowsheet Row Pulmonary Rehab from 05/30/2016 in [facility] Cardiac and Pulmonary Rehab  Referring Provider  Raul Del      Initial Encounter Date:  Flowsheet Row Pulmonary Rehab from 05/30/2016 in [facility] Cardiac and Pulmonary Rehab  Date  05/30/16  Referring Provider  Raul Del      Visit Diagnosis: Obstructive sleep apnea syndrome  Morbid obesity due to excess calories (HCC)  COPD, mild ([place])  Patient's Home Medications on Admission:  Current Outpatient Prescriptions:    albuterol (PROAIR HFA) 108 (90 Base) MCG/ACT inhaler, Inhale 2 puffs into the lungs every 6 (six) hours as needed for wheezing or shortness of breath. Reported on 04/21/2016, Disp: , Rfl:    aspirin EC 81 MG tablet, Take 81 mg by mouth every morning., Disp: , Rfl:    atorvastatin (LIPITOR) 40 MG tablet, Take 40 mg by mouth daily., Disp: , Rfl:    famotidine (PEPCID) 20 MG tablet, Take 1 tablet (20 mg total) by mouth daily., Disp: 30 tablet, Rfl: 0   fluticasone-salmeterol (ADVAIR HFA) 115-21 MCG/ACT inhaler, Inhale 2 puffs into the lungs 2 (two) times daily., Disp: , Rfl:    furosemide (LASIX) 40 MG tablet, Take 0.5 tablets (20 mg total) by mouth 2 (two) times daily., Disp: 60 tablet, Rfl: 0   hydrALAZINE (APRESOLINE) 25 MG tablet, Take 2 tablets (50 mg total) by mouth every 8 (eight) hours., Disp: 180 tablet, Rfl: 3   levothyroxine (SYNTHROID, LEVOTHROID) 50 MCG tablet, Take 50 mcg by mouth daily before breakfast., Disp: , Rfl:    losartan (COZAAR) 50 MG tablet, Take 2 tablets (100 mg total) by mouth daily., Disp: 90 tablet, Rfl: 0   magnesium oxide (MAG-OX) 400 MG tablet, Take 400 mg by mouth 2 (two) times daily. , Disp: , Rfl:    metoprolol (LOPRESSOR) 50 MG tablet, Take 1 tablet (50 mg total) by mouth 2 (two) times daily., Disp: 60 tablet, Rfl: 0   nitroGLYCERIN (NITROSTAT)  0.4 MG SL tablet, Take 0.4 mg by mouth every 5 (five) minutes x 3 doses as needed for chest pain. As needed for chest pain, Disp: , Rfl:    tiotropium (SPIRIVA) 18 MCG inhalation capsule, Place 18 mcg into inhaler and inhale daily., Disp: , Rfl:   Past Medical History: Past Medical History:  Diagnosis Date   Alcoholism ([place])    Asthma    Bipolar affective disorder ([place])    COPD (chronic obstructive pulmonary disease) ([place])    COPD   Coronary artery disease February 2005   PCI and Taxus drug-eluting stent placement to the distal RCA (3.5 x 12 mm)   Degenerative joint disease    Essential hypertension    Hyperlipidemia    Hypertension    Prostate CA ([place])    prostate ca dx 57 ys ago;   Prostate CA ([place])    prostate ca dx 20 yrs ago   PVD (peripheral vascular disease) ([place])    Tobacco abuse     Tobacco Use: History  Smoking Status   Former Smoker   Packs/day: 1.00   Years: 50.00   Types: Cigarettes  Smokeless Tobacco   Never Used    Labs: Recent Chemical engineer    Labs for ITP Cardiac and Pulmonary Rehab Latest Ref Rng & Units 10/30/2010 01/04/2011 05/31/2014 03/02/2016 04/26/2016   Cholestrol 100 - 199 mg/dL 161 ATP III CLASSIFICATION: <200  mg/dL   Desirable 200-239  mg/dL   Borderline High >=240    mg/dL   High - 138 218(H) 189   LDLCALC 0 - 99 mg/dL 78 Total Cholesterol/HDL:CHD Risk Coronary Heart Disease Risk Table Men   Women 1/2 Average Risk   3.4   3.3 Average Risk       5.0   4.4 2 X Average Risk   9.6   7.1 3 X Average Risk  23.4   11.0 Use the calculated Patient Ratio above and the CHD Risk Table to determine the patient's CHD Risk. ATP III CLASSIFICATION (LDL): <100     mg/dL   Optimal 100-129  mg/dL   Near or Above Optimal 130-159  mg/dL   Borderline 160-189  mg/dL   High >190     mg/dL   Very High - 83 135(H) 97   HDL >39 mg/dL 71 - 40 58 66   Trlycerides 0 - 149 mg/dL 60 - 75 124 129   Hemoglobin A1c 4.6 - 6.1 % - - -  - -   TCO2 0 - 100 mmol/L - 26 - - -       ADL UCSD:     Pulmonary Assessment Scores    Row Name 05/30/16 0900 07/14/16 1118       ADL UCSD   ADL Phase Entry  --    SOB Score total 49 66    Rest 1 1    Walk 3 3    Stairs 4 4    Bath 2 4    Dress 2 3    Shop 3 4       Pulmonary Function Assessment:     Pulmonary Function Assessment - 05/30/16 0900      Pulmonary Function Tests   FVC% 87 %   FEV1% 83 %   FEV1/FVC Ratio 73   RV% 80 %   DLCO% 60 %     Initial Spirometry Results   Comments PFT performed on 12/16/2015     Breath   Bilateral Breath Sounds Clear   Shortness of Breath Yes;Fear of Shortness of Breath;Limiting activity      Exercise Target Goals:    Exercise Program Goal: Individual exercise prescription set with THRR, safety & activity barriers. Participant demonstrates ability to understand and report RPE using BORG scale, to self-measure pulse accurately, and to acknowledge the importance of the exercise prescription.  Exercise Prescription Goal: Starting with aerobic activity 30 plus minutes a day, 3 days per week for initial exercise prescription. Provide home exercise prescription and guidelines that participant acknowledges understanding prior to discharge.  Activity Barriers & Risk Stratification:     Activity Barriers & Cardiac Risk Stratification - 05/30/16 0900      Activity Barriers & Cardiac Risk Stratification   Activity Barriers Shortness of Breath;Deconditioning;Muscular Weakness   Cardiac Risk Stratification Moderate      6 Minute Walk:     6 Minute Walk    Row Name 05/30/16 1535 07/14/16 1058       6 Minute Walk   Phase  -- (P)  Mid Program    Distance 533 feet (P)  780 feet    Walk Time 4 minutes (P)  6 minutes    # of Rest Breaks 1  stopped at 4:00 (P)  0    MPH 1.5  --    METS 2.1  --    RPE 18 (P)  13    Perceived Dyspnea   -- (  P)  5    Symptoms Yes (comment) (P)  No    Comments overall fatigue - legs and  breathing  --    Resting HR 67 bpm (P)  81 bpm    Resting BP 154/70 (P)  160/78    Max Ex. HR 123 bpm (P)  130 bpm    Max Ex. BP 160/70 (P)  230/80    2 Minute Post BP 154/72 (P)  170/80      Interval HR   Baseline HR 67 (P)  81    1 Minute HR 105  --    2 Minute HR 108  --    3 Minute HR 117 (P)  128    4 Minute HR 123  --    2 Minute Post HR 72  --    Interval Heart Rate? Yes  --      Interval Oxygen   Interval Oxygen? Yes  --    Baseline Oxygen Saturation % 98 % (P)  98 %    Baseline Liters of Oxygen  -- (P)  --  Room Air    1 Minute Oxygen Saturation % 96 %  --    2 Minute Oxygen Saturation % 96 %  --    3 Minute Oxygen Saturation % 93 % (P)  90 %    4 Minute Oxygen Saturation % 93 %  --    6 Minute Oxygen Saturation %  -- (P)  89 %  Room air    6 Minute Liters of Oxygen  -- (P)  --  Room Air    2 Minute Post Oxygen Saturation % 98 % (P)  99 %  Room Air       Initial Exercise Prescription:     Initial Exercise Prescription - 05/30/16 1500      Date of Initial Exercise RX and Referring Provider   Date 05/30/16   Referring Provider Meredeth Ide     Treadmill   MPH 1.3   Grade 0   Minutes 15  4/4/4   METs 2     Recumbant Elliptical   Level 1   Watts 50   Minutes 15  can do 4/4/4    METs 2.1     T5 Nustep   Level 1   Watts 30   Minutes 15  4/4/4   METs 2.1     Prescription Details   Frequency (times per week) 3   Duration Progress to 45 minutes of aerobic exercise without signs/symptoms of physical distress     Intensity   THRR 40-80% of Max Heartrate 97-126   Ratings of Perceived Exertion 11-13     Progression   Progression Continue to progress workloads to maintain intensity without signs/symptoms of physical distress.     Resistance Training   Training Prescription Yes   Weight 3   Reps 10-15      Perform Capillary Blood Glucose checks as needed.  Exercise Prescription Changes:     Exercise Prescription Changes    Row Name 06/07/16  1300 06/21/16 1300 07/05/16 1500 07/19/16 1400 08/02/16 1400     Exercise Review   Progression  -- Yes Yes Yes Yes     Response to Exercise   Blood Pressure (Admit) 126/66 130/64 146/82 150/86 140/80   Blood Pressure (Exercise) 192/78 174/68 182/64 178/64 164/78   Blood Pressure (Exit) 134/52 142/58 126/72 134/66 142/78   Heart Rate (Admit) 71 bpm 82 bpm 79 bpm 74 bpm 86 bpm  Heart Rate (Exercise) 111 bpm 116 bpm 110 bpm 115 bpm 123 bpm   Heart Rate (Exit) 89 bpm 86 bpm 97 bpm 79 bpm 84 bpm   Oxygen Saturation (Admit) 97 % 97 % 96 % 99 % 97 %   Oxygen Saturation (Exercise) 96 % 93 % 94 % 95 % 93 %   Oxygen Saturation (Exit) 98 % 97 % 96 % 97 % 97 %   Rating of Perceived Exertion (Exercise) _0 Perceived Dyspnea (Exercise) _1 Symptoms  -- none none none SOB on treadmill    Comments  --  --  --  -- Home Exercise Guidelines given 08/02/16   Duration Progress to 30 minutes of continuous aerobic without signs/symptoms of physical distress Progress to 45 minutes of aerobic exercise without signs/symptoms of physical distress Progress to 45 minutes of aerobic exercise without signs/symptoms of physical distress Progress to 45 minutes of aerobic exercise without signs/symptoms of physical distress Progress to 45 minutes of aerobic exercise without signs/symptoms of physical distress   Intensity _2      Progression   Progression  -- Continue to progress workloads to maintain intensity without signs/symptoms of physical distress. Continue to progress workloads to maintain intensity without signs/symptoms of physical distress. Continue to progress workloads to maintain intensity without signs/symptoms of physical distress. Continue to progress workloads to maintain intensity without signs/symptoms of physical distress.   Average METs  -- 2 2.3  -- 2.29     Resistance Training   Training Prescription Yes Yes  Yes  -- Yes   Weight _3 -- 4 lbs   Reps 10-15 10-15 10-15  -- 10-15     Interval Training   Interval Training  -- No No  -- No     Treadmill   MPH 1 1.3 1.3  -- 1.4   Grade 0 0 0  -- 0   Minutes 15  4/6/4 min work with 3 min rest _4 -- 15   METs  -- 2 2  -- 2.07     Arm Ergometer   Level  -- 3 3  -- 4   Watts  -- 18  --  --  --   Minutes  -- 10 15  -- 15   METs  -- 2.1 2.5  -- 2.3     Recumbant Elliptical   Level --  did not do - bothered knee  --  --  --  --     T5 Nustep   Level _5 -- 4   Watts 30 24  --  --  --   Minutes 15  5 work 3 rest 3 times 15 15  -- 15   METs  -- 2 2.3  -- 2     Home Exercise Plan   Plans to continue exercise at  --  --  --  -- Home  walking and complex in gym   Frequency  --  --  --  -- Add 1 additional day to program exercise sessions.   [name] Name 08/16/16 1400 08/29/16 1500           Exercise Review   Progression Yes Yes        Response to Exercise   Blood Pressure (Admit) 132/74 148/78      Blood  Pressure (Exercise) 164/72 160/70      Blood Pressure (Exit) 128/66 130/66      Heart Rate (Admit) 116 bpm 104 bpm      Heart Rate (Exercise) 132 bpm 127 bpm      Heart Rate (Exit) 96 bpm 102 bpm      Oxygen Saturation (Admit) 94 % 97 %      Oxygen Saturation (Exercise) 95 % 93 %      Oxygen Saturation (Exit) 97 % 97 %      Rating of Perceived Exertion (Exercise) 13 17      Perceived Dyspnea (Exercise) 4 4      Symptoms SOB on treadmill  SOB on treadmill       Comments Home Exercise Guidelines given 08/02/16 Home Exercise Guidelines given 08/02/16      Duration Progress to 45 minutes of aerobic exercise without signs/symptoms of physical distress Progress to 45 minutes of aerobic exercise without signs/symptoms of physical distress      Intensity THRR unchanged THRR unchanged        Progression   Progression Continue to progress workloads to maintain intensity without signs/symptoms of physical distress.  Continue to progress workloads to maintain intensity without signs/symptoms of physical distress.      Average METs 2.66 3.16        Resistance Training   Training Prescription Yes Yes      Weight 4 lbs 4 lbs      Reps 10-15 10-15        Interval Training   Interval Training No No        Treadmill   MPH 1.4 1.4      Grade 0 0.5      Minutes 15 15      METs 2.07 2.17        Arm Ergometer   Level 5 8      Watts --  50 rpm --  42 rpm      Minutes 15 15      METs 3.2 4.1        T5 Nustep   Level 5 5      Minutes 15 15      METs 2.7 3.2        Home Exercise Plan   Plans to continue exercise at Home  walking and complex in gym Home      Frequency Add 1 additional day to program exercise sessions. Add 2 additional days to program exercise sessions.         Exercise Comments:     Exercise Comments    Row Name 06/07/16 1311 06/21/16 1310 07/05/16 1527 07/19/16 1427 08/02/16 1115   Exercise Comments showed Nicholas Carrick a hamstring stretch to do during rest from TM as his hamstrings get really tight walking [name] is off to a good start!  He has already moved up to level 3 on the Arm Ergometer.  He is making good progress and we will continue to monitor for progression.  He did stop using the recumbent elliptical as it hurt his knees. [name] has been doing well with exercise. He has continued to make progress on his workloads. [name] is doing well with exercise.  He is up to 15 min continuous on the treadmill now!  We will continue to monitor for progresion Reviewed home exercise with pt today.  Pt plans to walk at home and go to gym in complex for exercise.  Reviewed THR, pulse, RPE, sign and  symptoms, and when to call 911 or MD.  Also discussed weather considerations and indoor options.  Pt voiced understanding.   [place] Name 08/02/16 1413 08/16/16 1432 08/29/16 1537 10/12/16 1156     Exercise Comments [name] is doing well with exercise.  He has just bumped up his treadmill to 1.4 mph.   We will continue to monitor for progression. [name] continues to do well with exercise.  He is now up to level 5 on the arm crank.  We will continue to monitor for progression.  [name] is doing well with exercise.  He was officially under 300 lbs at last visit!!!!!  He is up to level 8 on the arm crank and level 7 on the T5 stepper!!  We will continue to monitor for progression.  He is scheduled to do his post 6 min walk test at his next visit. [name] hasn't attended since 08/23/16 due to kidney problems.  He saw his Nicholas 10/13.  He completed 31 sessions of [facility] and it is not known if he will return.       Discharge Exercise Prescription (Final Exercise Prescription Changes):     Exercise Prescription Changes - 08/29/16 1500      Exercise Review   Progression Yes     Response to Exercise   Blood Pressure (Admit) 148/78   Blood Pressure (Exercise) 160/70   Blood Pressure (Exit) 130/66   Heart Rate (Admit) 104 bpm   Heart Rate (Exercise) 127 bpm   Heart Rate (Exit) 102 bpm   Oxygen Saturation (Admit) 97 %   Oxygen Saturation (Exercise) 93 %   Oxygen Saturation (Exit) 97 %   Rating of Perceived Exertion (Exercise) 17   Perceived Dyspnea (Exercise) 4   Symptoms SOB on treadmill    Comments Home Exercise Guidelines given 08/02/16   Duration Progress to 45 minutes of aerobic exercise without signs/symptoms of physical distress   Intensity THRR unchanged     Progression   Progression Continue to progress workloads to maintain intensity without signs/symptoms of physical distress.   Average METs 3.16     Resistance Training   Training Prescription Yes   Weight 4 lbs   Reps 10-15     Interval Training   Interval Training No     Treadmill   MPH 1.4   Grade 0.5   Minutes 15   METs 2.17     Arm Ergometer   Level 8   Watts --  42 rpm   Minutes 15   METs 4.1     T5 Nustep   Level 5   Minutes 15   METs 3.2     Home Exercise Plan   Plans to continue exercise at Home    Frequency Add 2 additional days to program exercise sessions.       Nutrition:  Target Goals: Understanding of nutrition guidelines, daily intake of sodium '1500mg'$ , cholesterol '200mg'$ , calories 30% from fat and 7% or less from saturated fats, daily to have 5 or more servings of fruits and vegetables.  Biometrics:    Nutrition Therapy Plan and Nutrition Goals:     Nutrition Therapy & Goals - 05/30/16 0900      Nutrition Therapy   Diet Nicholas Nieto would like to consult with the dietitian. He does all the cooking, cooks green vegtables,low salt, and likes sweets.       Nutrition Discharge: Rate Your Plate Scores:   Psychosocial: Target Goals: Acknowledge presence or absence of depression, maximize coping  skills, provide positive support system. Participant is able to verbalize types and ability to use techniques and skills needed for reducing stress and depression.  Initial Review & Psychosocial Screening:     Initial Psych Review & Screening - 05/30/16 0900      Initial Review   Current issues with History of Depression     Family Dynamics   Good Support System? Yes   Comments Good support from his family; long history of manic depression; has been admitted to an institution for 8 months, 8 years ago - it was successful therapy and is not on medication at this time.     Barriers   Psychosocial barriers to participate in program The patient should benefit from training in stress management and relaxation.     Screening Interventions   Interventions Encouraged to exercise;Program counselor consult      Quality of Life Scores:     Quality of Life - 05/30/16 0900      Quality of Life Scores   Health/Function Pre 20.25 %   Socioeconomic Pre 19.33 %   Psych/Spiritual Pre 19.33 %   Family Pre 20.6 %   GLOBAL Pre 19.97 %      PHQ-9: Recent Review Flowsheet Data    Depression screen [facility] 2/9 10/30/2016 10/03/2016 05/30/2016   Decreased Interest 0 - 3   Down,  Depressed, Hopeless _0 PHQ - 2 Score _1 Altered sleeping - - 3   Tired, decreased energy - - 3   Change in appetite - - 1   Feeling bad or failure about yourself  - - 1   Trouble concentrating - - 0   Moving slowly or fidgety/restless - - 0   Suicidal thoughts 0 0 0   PHQ-9 Score - - 13   Difficult doing work/chores - - Somewhat difficult      Psychosocial Evaluation and Intervention:     Psychosocial Evaluation - 06/14/16 1137      Psychosocial Evaluation & Interventions   Interventions Encouraged to exercise with the program and follow exercise prescription;Stress management education;Relaxation education   Comments Counselor met with Nicholas. Pilley today for initial psychosocial evaluation.  He is a 81 year old gentleman who has COPD and struggles with Sleep Apnea; Obesity and a history of "manic/depression" (Bi-Polar Disorder).  He states his support system is strong although he lives alone and reports his daughter is his primary support person - who lives 30 minutes away.  He admits to a history of "manic/Depression" and reports not being on any meds for the past 2 years.  He reports having been in treatment in [place] for 9 months at one point for alcohol and mood problems and is now able to recognize and cope with his triggers and mood issues.  Nicholas. Tetrick reports his mood currently is a "7"  on a scale of 1-10 with 10 being best.  He admits to his health being his primary stressor currently.  Nicholas. [name] has goals to lose weight, move and breathe better; walk better and increase his energy in general while in this program.  Counselor asked Nicholas. [name] if he would benefit from a Counselor for his mood  diagnosis currently and he declined, but stated he generally enjoys group therapy.  Counselor will look for groups locally that take his insurance to help meet this request.  Counselor will follow with Nicholas. Clugston throughout the course of this program.     Continued  Psychosocial Services Needed  Yes  Nicholas. Stanislaw will benefit from all of the psychoeducational components of this program, as well as meeting with the dietician to address his weight loss goals.       Psychosocial Re-Evaluation:     Psychosocial Re-Evaluation    Row Name 06/28/16 1229 07/31/16 1123 08/23/16 1220         Psychosocial Re-Evaluation   Comments Counselor follow up with Nicholas. [name] today reporting he is walking better and able to tie his shoes now since coming into this exercise program.  He continues to struggle with shortness of breath and sleep disturbance.  Counselor suggested Nicholas. [name] speak with his Nicholas. or pharmacist about a possible OTC sleep aid that is sustained release to help with this issue.  Counselor will continue to follow with Nicholas. [name]. Counselor follow up with Nicholas. [name] reporting increased energy and flexibility since coming into this program.  He states his mood continues to be a problem and it is "up and down" but mostly "down."  However, Nicholas. [name] states he "won't take meds for this" since he was on so many at one time.  Counselor encouraged him to speak with his Nicholas. about trying just one/day medication for mood to help stabilize this.  He is scheduled to see the Nicholas. tomorrow about his hamstrings that he complains having been hurting a great deal lately.  Counselor will continue to follow and commended Nicholas. [name] on the progress made so far.   Counselor follow up with Nicholas. [name] today reporting he has lost more weight and was so excited about this.  He states that this program is great as he feels healthier coming here.  Counselor commended Nicholas. [name] for all of his hard work and progress, particularly in his weight loss.  Nicholas. [name] continues to report sleep disturbance and has recently had a depressive episolde.  Counselor encouraged Nicholas. Brill to see his Nicholas. about possible medications to address both of these issues.  He states he is scheduled to see his Nicholas. tomorrow.  Counselor also got verbal permission from Nicholas.  [name] to speak with his daughter who was present today about these concerns and she agreed to mention these to the Nicholas. as she accompanies him to that appointment tomorrow.  Counselor will continue to follow with Nicholas. [name] as he is scheduled to discharge soon and has applied to attend the [employer] program on site at [facility]         Education: Education Goals: Education classes will be provided on a weekly basis, covering required topics. Participant will state understanding/return demonstration of topics presented.  Learning Barriers/Preferences:     Learning Barriers/Preferences - 05/30/16 0900      Learning Barriers/Preferences   Learning Barriers None   Learning Preferences Group Instruction;Individual Instruction;Pictoral;Skilled Demonstration;Verbal Instruction;Video;Written Material      Education Topics: Initial Evaluation Education: - Verbal, written and demonstration of respiratory meds, RPE/PD scales, oximetry and breathing techniques. Instruction on use of nebulizers and MDIs: cleaning and proper use, rinsing mouth with steroid doses and importance of monitoring MDI activations. Flowsheet Row Pulmonary Rehab from 08/23/2016 in [facility] Cardiac and Pulmonary Rehab  Date  05/30/16  Educator  LB  Instruction Review Code  2- meets goals/outcomes      General Nutrition Guidelines/Fats and Fiber: -Group instruction provided by verbal, written material, models and posters to present the general guidelines for heart healthy nutrition. Gives an explanation and review of dietary fats and fiber. Flowsheet Row  Pulmonary Rehab from 08/23/2016 in [facility] Cardiac and Pulmonary Rehab  Date  07/10/16  Educator  CR  Instruction Review Code  2- meets goals/outcomes      Controlling Sodium/Reading Food Labels: -Group verbal and written material supporting the discussion of sodium use in heart healthy nutrition. Review and explanation with models, verbal and written materials for utilization of the  food label. Flowsheet Row Pulmonary Rehab from 08/23/2016 in [facility] Cardiac and Pulmonary Rehab  Date  07/17/16  Educator  CR  Instruction Review Code  2- meets goals/outcomes      Exercise Physiology & Risk Factors: - Group verbal and written instruction with models to review the exercise physiology of the cardiovascular system and associated critical values. Details cardiovascular disease risk factors and the goals associated with each risk factor. Flowsheet Row Pulmonary Rehab from 08/23/2016 in [facility] Cardiac and Pulmonary Rehab  Date  08/09/16  Educator  AS  Instruction Review Code  2- meets goals/outcomes      Aerobic Exercise & Resistance Training: - Gives group verbal and written discussion on the health impact of inactivity. On the components of aerobic and resistive training programs and the benefits of this training and how to safely progress through these programs.   Flexibility, Balance, General Exercise Guidelines: - Provides group verbal and written instruction on the benefits of flexibility and balance training programs. Provides general exercise guidelines with specific guidelines to those with heart or lung disease. Demonstration and skill practice provided. Flowsheet Row Pulmonary Rehab from 08/23/2016 in [facility] Cardiac and Pulmonary Rehab  Date  07/12/16  Educator  AS  Instruction Review Code  2- meets goals/outcomes      Stress Management: - Provides group verbal and written instruction about the health risks of elevated stress, cause of high stress, and healthy ways to reduce stress.   Depression: - Provides group verbal and written instruction on the correlation between heart/lung disease and depressed mood, treatment options, and the stigmas associated with seeking treatment. Flowsheet Row Pulmonary Rehab from 08/23/2016 in [facility] Cardiac and Pulmonary Rehab  Date  08/23/16  Educator  [facility]  Instruction Review Code  2- meets goals/outcomes      Exercise & Equipment  Safety: - Individual verbal instruction and demonstration of equipment use and safety with use of the equipment.   Infection Prevention: - Provides verbal and written material to individual with discussion of infection control including proper hand washing and proper equipment cleaning during exercise session. Flowsheet Row Pulmonary Rehab from 08/23/2016 in [facility] Cardiac and Pulmonary Rehab  Date  05/30/16  Educator  LB  Instruction Review Code  2- meets goals/outcomes      Falls Prevention: - Provides verbal and written material to individual with discussion of falls prevention and safety. Flowsheet Row Pulmonary Rehab from 08/23/2016 in [facility] Cardiac and Pulmonary Rehab  Date  05/30/16  Educator  LB  Instruction Review Code  2- meets goals/outcomes      Diabetes: - Individual verbal and written instruction to review signs/symptoms of diabetes, desired ranges of glucose level fasting, after meals and with exercise. Advice that pre and post exercise glucose checks will be done for 3 sessions at entry of program.   Chronic Lung Diseases: - Group verbal and written instruction to review new updates, new respiratory medications, new advancements in procedures and treatments. Provide informative websites and "800" numbers of self-education. Flowsheet Row Pulmonary Rehab from 08/23/2016 in [facility] Cardiac and Pulmonary Rehab  Date  07/03/16  Educator  LB  Instruction Review Code  2- meets goals/outcomes      Lung Procedures: - Group verbal and written instruction to describe testing methods done to diagnose lung disease. Review the outcome of test results. Describe the treatment choices: Pulmonary Function Tests, ABGs and oximetry. Flowsheet Row Pulmonary Rehab from 08/23/2016 in [facility] Cardiac and Pulmonary Rehab  Date  07/07/16  Educator  [place]  Instruction Review Code  2- meets goals/outcomes      Energy Conservation: - Provide group verbal and written instruction for methods to conserve  energy, plan and organize activities. Instruct on pacing techniques, use of adaptive equipment and posture/positioning to relieve shortness of breath.   Triggers: - Group verbal and written instruction to review types of environmental controls: home humidity, furnaces, filters, dust mite/pet prevention, HEPA vacuums. To discuss weather changes, air quality and the benefits of nasal washing.   Exacerbations: - Group verbal and written instruction to provide: warning signs, infection symptoms, calling MD promptly, preventive modes, and value of vaccinations. Review: effective airway clearance, coughing and/or vibration techniques. Create an Sports administrator. Flowsheet Row Pulmonary Rehab from 08/23/2016 in [facility] Cardiac and Pulmonary Rehab  Date  06/12/16  Educator  LB  Instruction Review Code  2- meets goals/outcomes      Oxygen: - Individual and group verbal and written instruction on oxygen therapy. Includes supplement oxygen, available portable oxygen systems, continuous and intermittent flow rates, oxygen safety, concentrators, and Medicare reimbursement for oxygen.   Respiratory Medications: - Group verbal and written instruction to review medications for lung disease. Drug class, frequency, complications, importance of spacers, rinsing mouth after steroid MDI's, and proper cleaning methods for nebulizers. Flowsheet Row Pulmonary Rehab from 08/23/2016 in [facility] Cardiac and Pulmonary Rehab  Date  05/30/16  Educator  LB  Instruction Review Code  2- meets goals/outcomes      AED/CPR: - Group verbal and written instruction with the use of models to demonstrate the basic use of the AED with the basic ABC's of resuscitation. Flowsheet Row Pulmonary Rehab from 08/23/2016 in [facility] Cardiac and Pulmonary Rehab  Date  07/21/16  Educator  CE  Instruction Review Code  2- meets goals/outcomes      Breathing Retraining: - Provides individuals verbal and written instruction on purpose, frequency, and  proper technique of diaphragmatic breathing and pursed-lipped breathing. Applies individual practice skills. Flowsheet Row Pulmonary Rehab from 08/23/2016 in [facility] Cardiac and Pulmonary Rehab  Date  05/30/16  Educator  LB  Instruction Review Code  2- meets goals/outcomes      Anatomy and Physiology of the Lungs: - Group verbal and written instruction with the use of models to provide basic lung anatomy and physiology related to function, structure and complications of lung disease.   Heart Failure: - Group verbal and written instruction on the basics of heart failure: signs/symptoms, treatments, explanation of ejection fraction, enlarged heart and cardiomyopathy. Flowsheet Row Pulmonary Rehab from 08/23/2016 in [facility] Cardiac and Pulmonary Rehab  Date  08/18/16  Educator  CE  Instruction Review Code  2- meets goals/outcomes      Sleep Apnea: - Individual verbal and written instruction to review Obstructive Sleep Apnea. Review of risk factors, methods for diagnosing and types of masks and machines for OSA. Flowsheet Row Pulmonary Rehab from 08/23/2016 in [facility] Cardiac and Pulmonary Rehab  Date  05/30/16  Educator  LB  Instruction Review Code  2- meets goals/outcomes      Anxiety: - Provides group, verbal and written instruction on the correlation between heart/lung  disease and anxiety, treatment options, and management of anxiety. Flowsheet Row Pulmonary Rehab from 08/23/2016 in [facility] Cardiac and Pulmonary Rehab  Date  06/28/16  Educator  [facility]  Instruction Review Code  2- Meets goals/outcomes      Relaxation: - Provides group, verbal and written instruction about the benefits of relaxation for patients with heart/lung disease. Also provides patients with examples of relaxation techniques.   Knowledge Questionnaire Score:     Knowledge Questionnaire Score - 05/30/16 0900      Knowledge Questionnaire Score   Pre Score 9/10       Core Components/Risk Factors/Patient Goals at  Admission:     Personal Goals and Risk Factors at Admission - 08/18/16 1057      Core Components/Risk Factors/Patient Goals on Admission    Weight Management Weight Loss   Intervention Weight Management: Develop a combined nutrition and exercise program designed to reach desired caloric intake, while maintaining appropriate intake of nutrient and fiber, sodium and fats, and appropriate energy expenditure required for the weight goal.      Core Components/Risk Factors/Patient Goals Review:      Goals and Risk Factor Review    Row Name 06/07/16 1315 06/12/16 1000 06/16/16 1043 07/03/16 1532 07/05/16 1120     Core Components/Risk Factors/Patient Goals Review   Personal Goals Review Develop more efficient breathing techniques such as purse lipped breathing and diaphragmatic breathing and practicing self-pacing with activity. Increase knowledge of respiratory medications and ability to use respiratory devices properly. Weight Management/Obesity;Hypertension Sedentary;Increase Strength and Stamina;Improve shortness of breath with ADL's;Increase knowledge of respiratory medications and ability to use respiratory devices properly.;Develop more efficient breathing techniques such as purse lipped breathing and diaphragmatic breathing and practicing self-pacing with activity. Sedentary;Increase Strength and Stamina;Improve shortness of breath with ADL's   Review reviewed PLB and patient verbally stated understanding Discussed with Nicholas Gastineau about his 2 inhalers. He has a good understanding of his Advair - rinses his mouth out after use. He rarely uses his Albuterol inhaler. He also is very compliant with his CPAP and knows how it has improved his health. weight up 4 lbs and Instructed Deshane to call his MD's office. He takes a fluid pill every other day.  Hiren says he really wants to lose weight. Blood pressure is good.  Nicholas Vitanza has increased his exercise goals and is now walking 22mns straight on the  treadmill. He states he can now keep his apartment clean - doing the mopping and other chores at home. He uses PLB with this activiy. He is very compliant with his CPAP and Inhalers , although he rarely uses his Albuterol. HDalreports he notices more flexibility and can move a lot better.  His hamstrings are not nearly as tight as before.  He also notices more energy to do cleaning and moving around whil eat home.   Expected Outcomes  -- Continue to be compliant with his CPAP and AdvairMDI. Weight decrease.   -- HIthielwill continue to see improvements in his ADLs and overall fitness with contuned participation.   RCantonName 07/10/16 1143 07/12/16 1322 08/07/16 1052 08/08/16 0813 08/18/16 1057     Core Components/Risk Factors/Patient Goals Review   Personal Goals Review Increase Strength and Stamina;Improve shortness of breath with ADL's;Sedentary Sedentary Increase Strength and Stamina;Sedentary  -- Weight Management/Obesity   Review HDjimonreported that he is not currently doing home exercises, but he is [place] to class to exercise. He has noticed improvements in strength and stamina  during ADL's.  Howars stes his hamstrings are still tight but not as bad as when he began the program.  He sits most of the time at home, but will try leaving his water bottle in the kitchen so he has to get up and move to get it.  Rayce was going to check into the fitness complex where he lives this weekend but had a depressive episode this weekend and was not able to leave the house to check into this option. He did state that he was noticing improvements in strength and stamina.  Nicholas Cerone has a good understanding of his MDI's and is compliant with the medications. He uses PLB with good technique with his exercise goals and activities around the house. Since he has started [facility], Nicholas Terhune has more energy and less shortness of breath. Quinn reports that the doctor started him on a diet pill last week so Wessley  hopes to lose weight. Avelino said he quit smoking 1 year ago and his weight went up. Kraig said the doctor didn't say anything about his blood pressure. Upon arrival today. 1146.8   Expected Outcomes Rayjon would continue to consistantly attend class and use breathing techniques to manage SOB in order to facilitate progression with his workloads to help with strength and stamina.  More daily physical activity will help Harold's overall fitness and he will see continues improvement in hamstring flexibility. Bell would be able to check into the fitness complex at his residence to see if the equipment would be suitable for him to use on his days off of pulmonary rehab.   -- Cont to be healthy or healthier.   [place] Name 08/23/16 1532             Core Components/Risk Factors/Patient Goals Review   Personal Goals Review Weight Management/Obesity       Review Nicholas [name]'s weight was 299lbs today. He was very pleased with his weight loss. He continues to eat salads and vegetables and drinks only water and milk.          Core Components/Risk Factors/Patient Goals at Discharge (Final Review):      Goals and Risk Factor Review - 08/23/16 1532      Core Components/Risk Factors/Patient Goals Review   Personal Goals Review Weight Management/Obesity   Review Nicholas [name]'s weight was 299lbs today. He was very pleased with his weight loss. He continues to eat salads and vegetables and drinks only water and milk.      ITP Comments:     ITP Comments    Row Name 06/16/16 1043 08/18/16 1056 09/06/16 1448 10/12/16 1158 10/24/16 1542   ITP Comments weight up 4 lbs and Instructed Zakk to call his MD's office. He takes a fluid pill every other day.  Caedin reports that the doctor started him on a diet pill last week so Najee hopes to lose weight. Merric said he quit smoking 1 year ago and his weight went up.  [name] called to let us know that Henry had been out because he was in the hospital with his  kidneys.  He was discharged yesterday.  She is going to call to get clearance to return to rehab and talk to her Dad about whether he wants to finish out or come to [facility]. Nicholas Tallman hasn't attended since 08/23/16 due to kidney problems.  He saw his Nicholas 10/13.  He completed 31 sessions of [facility] and it is not known if he will return. Called Nicholas  Cape's daughter. Plan to discharge Nicholas Wacha - last attended Called Nicholas Hemme's daughter, [name], and discussed plan to discharge Nicholas Crossan. Last attended 08/23/16. He is having therapy from home nursing at this time. He plans to return to [facility] under the [place] Name 10/31/16 1148           ITP Comments Called Nicholas Badilla's daughter. Plan to discharge Nicholas Lanuza - last attended Called Nicholas Macphail's daughter, [name], and discussed plan to discharge Nicholas Patras. Last attended 08/23/16. He is having therapy from home nursing at this time. He plans to return to [facility] under the [employer].          Comments: Nicholas Mathenia is being discharged from [place]. He last attended 08/23/16 due to illness and hospital admission. Nicholas Testa is still receiving home health care and will return to [facility] after completion of this therapy.

## 2016-11-23 ENCOUNTER — Ambulatory Visit (INDEPENDENT_AMBULATORY_CARE_PROVIDER_SITE_OTHER): Payer: Medicare Other | Admitting: Cardiovascular Disease

## 2016-11-23 ENCOUNTER — Encounter: Payer: Self-pay | Admitting: Cardiovascular Disease

## 2016-11-23 VITALS — BP 112/66 | HR 60 | Ht 67.0 in | Wt 295.5 lb

## 2016-11-23 DIAGNOSIS — I5022 Chronic systolic (congestive) heart failure: Secondary | ICD-10-CM

## 2016-11-23 DIAGNOSIS — I25810 Atherosclerosis of coronary artery bypass graft(s) without angina pectoris: Secondary | ICD-10-CM

## 2016-11-23 DIAGNOSIS — I10 Essential (primary) hypertension: Secondary | ICD-10-CM

## 2016-11-23 DIAGNOSIS — I25118 Atherosclerotic heart disease of native coronary artery with other forms of angina pectoris: Secondary | ICD-10-CM

## 2016-11-23 DIAGNOSIS — E7849 Other hyperlipidemia: Secondary | ICD-10-CM

## 2016-11-23 DIAGNOSIS — I209 Angina pectoris, unspecified: Secondary | ICD-10-CM | POA: Diagnosis not present

## 2016-11-23 DIAGNOSIS — E784 Other hyperlipidemia: Secondary | ICD-10-CM | POA: Diagnosis not present

## 2016-11-23 MED ORDER — ISOSORBIDE MONONITRATE ER 30 MG PO TB24: 30.0000 mg | ORAL_TABLET | Freq: Every day | ORAL | 5 refills | Status: DC

## 2016-11-23 NOTE — Progress Notes (Signed)
Cardiology Office Note   Date:  11/23/2016   ID:  [name], DOB [birth year], MRN [id number]  PCP:  Marguerita Merles, MD  Cardiologist:   Kathlyn [place], MD   Chief Complaint  Patient presents with  . other     Overdue f/u. Pt was in hospital  09/11/16 for acute CHF. Pt  Reviewed meds with pt verbally.      History of Present Illness: [name] is a 81 y.o. male who presents for a follow-up visit regarding hypertension and coronary artery disease. He had cardiac catheterization done in 2005 which showed single-vessel coronary artery disease involving the distal right coronary artery. He had an angioplasty and Taxus drug-eluting stent placement. No cardiac events since then. He has extensive medical problems that include borderline diabetes, hypertension, hyperlipidemia, sleep apnea on CPAP, morbid obesity, previous tobacco use, bipolar disorder and peripheral arterial disease. He follows with Dr. Raul Del for his lung issues. He had an echocardiogram in March of this year which showed low normal LV systolic function with mild aortic stenosis. Left atrium was moderately dilated. He was hospitalized in September with abdominal pain and vomiting. He was found to be hyponatremic with a sodium of 120. He also had acute on chronic renal failure and improved with hydration. He was readmitted a week later with heart failure which improved with diuresis. Repeat echocardiogram showed an EF of 40-45% with mild aortic stenosis. He was seen by AP but not working. He has been doing reasonably well and has been followed in the heart failure clinic. He was diagnosed with chronic kidney disease with most recent creatinine of 1.97. Losartan was discontinued. He reports occasional episodes of substernal chest tightness lasting only for a few minutes. These are not very frequent. He reports significant difficulties with sleep and mood swings related to bipolar disorder.   Past Medical History:    Diagnosis Date  . Alcoholism ([place])   . Asthma   . Bipolar affective disorder ([place])   . COPD (chronic obstructive pulmonary disease) (HCC)    COPD  . Coronary artery disease February 2005   PCI and Taxus drug-eluting stent placement to the distal RCA (3.5 x 12 mm)  . Degenerative joint disease   . Essential hypertension   . Hyperlipidemia   . Hypertension   . Prostate CA [facility])    prostate ca dx 2 ys ago;  . Prostate CA [facility])    prostate ca dx 20 yrs ago  . PVD (peripheral vascular disease) ([place])   . Tobacco abuse     Past Surgical History:  Procedure Laterality Date  . CARDIAC CATHETERIZATION    . CORONARY ANGIOPLASTY WITH STENT PLACEMENT    . TOTAL HIP ARTHROPLASTY     right     Current Outpatient Prescriptions  Medication Sig Dispense Refill  . albuterol (PROAIR HFA) 108 (90 Base) MCG/ACT inhaler Inhale 2 puffs into the lungs every 6 (six) hours as needed for wheezing or shortness of breath. Reported on 04/21/2016    . aspirin EC 81 MG tablet Take 81 mg by mouth every morning.    Marland Kitchen atorvastatin (LIPITOR) 40 MG tablet Take 40 mg by mouth daily.    . famotidine (PEPCID) 20 MG tablet Take 1 tablet (20 mg total) by mouth daily. 30 tablet 0  . fluticasone-salmeterol (ADVAIR HFA) 115-21 MCG/ACT inhaler Inhale 2 puffs into the lungs 2 (two) times daily.    . furosemide (LASIX) 40 MG tablet Take 0.5 tablets (20 mg  total) by mouth 2 (two) times daily. 60 tablet 0  . hydrALAZINE (APRESOLINE) 25 MG tablet Take 2 tablets (50 mg total) by mouth every 8 (eight) hours. 180 tablet 3  . ketoconazole (NIZORAL) 2 % shampoo Apply 1 application topically 3 (three) times a week.     . levothyroxine (SYNTHROID, LEVOTHROID) 50 MCG tablet Take 50 mcg by mouth daily before breakfast.    . magnesium oxide (MAG-OX) 400 MG tablet Take 400 mg by mouth 2 (two) times daily.     . metoprolol (LOPRESSOR) 50 MG tablet Take 1 tablet (50 mg total) by mouth 2 (two) times daily. 60 tablet 0  . nitroGLYCERIN  (NITROSTAT) 0.4 MG SL tablet Take 0.4 mg by mouth every 5 (five) minutes x 3 doses as needed for chest pain. As needed for chest pain    . tiotropium (SPIRIVA) 18 MCG inhalation capsule Place 18 mcg into inhaler and inhale daily.    . vitamin B-6 (PYRIDOXINE) 25 MG tablet Take 25 mg by mouth daily.     No current facility-administered medications for this visit.     Allergies:   Amlodipine; Benazepril-hydrochlorothiazide; Chlorthalidone; and Other    Social History:  The patient  reports that he has quit smoking. His smoking use included Cigarettes. He has a 50.00 pack-year smoking history. He has never used smokeless tobacco. He reports that he drinks alcohol. He reports that he does not use drugs.   Family History:  The patient's family history includes Heart attack in his father and mother; Hyperlipidemia in his father and mother; Hypertension in his father and mother.    ROS:  Please see the history of present illness.   Otherwise, review of systems are positive for none.   All other systems are reviewed and negative.    PHYSICAL EXAM: VS:  BP 112/66 (BP Location: Left Arm, Patient Position: Sitting, Cuff Size: Normal)   Pulse 60   Ht 5\' 7"  (1.702 m)   Wt 295 lb 8 oz (134 kg)   BMI 46.28 kg/m  , BMI Body mass index is 46.28 kg/m. GEN: Well nourished, well developed, in no acute distress  HEENT: normal  Neck: no JVD, carotid bruits, or masses Cardiac: RRR; no murmurs, rubs, or gallops,no edema  Respiratory:  clear to auscultation bilaterally, normal work of breathing GI: soft, nontender, nondistended, + BS MS: no deformity or atrophy  Skin: warm and dry, no rash Neuro:  Strength and sensation are intact Psych: euthymic mood, full affect   EKG:  EKG is ordered today. The ekg ordered today demonstrates normal sinus rhythm with LVH and nonspecific T wave changes.   Recent Labs: 08/30/2016: ALT 17 08/31/2016: Magnesium 1.9 09/11/2016: TSH 2.854 09/12/2016: Hemoglobin 11.8;  Platelets 314 10/30/2016: BUN 35; Creatinine, Ser 1.97; Potassium 4.3; Sodium 131    Lipid Panel    Component Value Date/Time   CHOL 189 04/26/2016 0810   CHOL 138 05/31/2014 0416   TRIG 129 04/26/2016 0810   TRIG 75 05/31/2014 0416   HDL 66 04/26/2016 0810   HDL 40 05/31/2014 0416   CHOLHDL 2.9 04/26/2016 0810   CHOLHDL 2.3 10/30/2010 0430   VLDL 15 05/31/2014 0416   LDLCALC 97 04/26/2016 0810   LDLCALC 83 05/31/2014 0416      Wt Readings from Last 3 Encounters:  11/23/16 295 lb 8 oz (134 kg)  10/30/16 296 lb (134.3 kg)  10/03/16 290 lb (131.5 kg)      No flowsheet data found.  ASSESSMENT AND PLAN:  1.   Coronary artery disease involving native coronary arteries with mild angina: Given his underlying chronic kidney disease, I recommend continuing medical therapy and reserving cardiac catheterization only for worsening symptoms. I elected to add Imdur 30 mg once daily.  2. Chronic diastolic/systolic heart failure:  Most recent ejection fraction was 40-45%. He appears to be euvolemic on current dose of furosemide 20 mg twice daily. He seems to be in [place] class III.  The patient has underlying chronic kidney disease and he had acute on chronic renal failure with losartan. Thus, it might be difficult for now to start him on an ACE inhibitor, ARB or Entresto. I elected to add Imdur to hydralazine especially that he is African-American.  3. Essential hypertension: Blood pressure is controlled on current medications.  4. Hyperlipidemia: Continue treatment with atorvastatin with a target LDL of less than 70.   Disposition:   FU with me in 4 months  Signed,  Kathlyn [place], MD  11/23/2016 10:20 AM    [place]

## 2016-11-23 NOTE — Patient Instructions (Signed)
Medication Instructions:  Your physician has recommended you make the following change in your medication:  START taking Imdur 30mg  once daily   Labwork: none  Testing/Procedures: none  Follow-Up: Your physician recommends that you schedule a follow-up appointment in: 4 months with Dr. Fletcher Anon.    Any Other Special Instructions Will Be Listed Below (If Applicable).     If you need a refill on your cardiac medications before your next appointment, please call your pharmacy.

## 2016-12-01 ENCOUNTER — Encounter: Payer: Self-pay | Admitting: Emergency Medicine

## 2016-12-01 ENCOUNTER — Emergency Department: Payer: Medicare Other

## 2016-12-01 ENCOUNTER — Observation Stay
Admission: EM | Admit: 2016-12-01 | Discharge: 2016-12-03 | Disposition: A | Discharge status: Home / Self Care | Payer: Medicare Other | Attending: Internal Medicine | Admitting: Internal Medicine

## 2016-12-01 DIAGNOSIS — Z8546 Personal history of malignant neoplasm of prostate: Secondary | ICD-10-CM | POA: Insufficient documentation

## 2016-12-01 DIAGNOSIS — Z955 Presence of coronary angioplasty implant and graft: Secondary | ICD-10-CM | POA: Insufficient documentation

## 2016-12-01 DIAGNOSIS — Z96641 Presence of right artificial hip joint: Secondary | ICD-10-CM | POA: Insufficient documentation

## 2016-12-01 DIAGNOSIS — J449 Chronic obstructive pulmonary disease, unspecified: Secondary | ICD-10-CM | POA: Diagnosis not present

## 2016-12-01 DIAGNOSIS — I110 Hypertensive heart disease with heart failure: Secondary | ICD-10-CM | POA: Insufficient documentation

## 2016-12-01 DIAGNOSIS — E860 Dehydration: Secondary | ICD-10-CM | POA: Diagnosis not present

## 2016-12-01 DIAGNOSIS — K5730 Diverticulosis of large intestine without perforation or abscess without bleeding: Secondary | ICD-10-CM | POA: Diagnosis not present

## 2016-12-01 DIAGNOSIS — K921 Melena: Secondary | ICD-10-CM | POA: Diagnosis not present

## 2016-12-01 DIAGNOSIS — Z87891 Personal history of nicotine dependence: Secondary | ICD-10-CM | POA: Insufficient documentation

## 2016-12-01 DIAGNOSIS — K922 Gastrointestinal hemorrhage, unspecified: Secondary | ICD-10-CM

## 2016-12-01 DIAGNOSIS — Z7982 Long term (current) use of aspirin: Secondary | ICD-10-CM | POA: Insufficient documentation

## 2016-12-01 DIAGNOSIS — Z79899 Other long term (current) drug therapy: Secondary | ICD-10-CM | POA: Insufficient documentation

## 2016-12-01 DIAGNOSIS — I2510 Atherosclerotic heart disease of native coronary artery without angina pectoris: Secondary | ICD-10-CM | POA: Insufficient documentation

## 2016-12-01 DIAGNOSIS — K625 Hemorrhage of anus and rectum: Secondary | ICD-10-CM | POA: Diagnosis present

## 2016-12-01 DIAGNOSIS — I5022 Chronic systolic (congestive) heart failure: Secondary | ICD-10-CM | POA: Diagnosis not present

## 2016-12-01 DIAGNOSIS — E871 Hypo-osmolality and hyponatremia: Secondary | ICD-10-CM

## 2016-12-01 DIAGNOSIS — E785 Hyperlipidemia, unspecified: Secondary | ICD-10-CM | POA: Insufficient documentation

## 2016-12-01 LAB — COMPREHENSIVE METABOLIC PANEL
ALBUMIN: 3.1 g/dL — AB (ref 3.5–5.0)
ALT: 18 U/L (ref 17–63)
ANION GAP: 6 (ref 5–15)
AST: 26 U/L (ref 15–41)
Alkaline Phosphatase: 107 U/L (ref 38–126)
BILIRUBIN TOTAL: 0.8 mg/dL (ref 0.3–1.2)
BUN: 41 mg/dL — ABNORMAL HIGH (ref 6–20)
CO2: 22 mmol/L (ref 22–32)
Calcium: 7.9 mg/dL — ABNORMAL LOW (ref 8.9–10.3)
Chloride: 97 mmol/L — ABNORMAL LOW (ref 101–111)
Creatinine, Ser: 1.93 mg/dL — ABNORMAL HIGH (ref 0.61–1.24)
GFR, EST AFRICAN AMERICAN: 36 mL/min — AB (ref >60)
GFR, EST NON AFRICAN AMERICAN: 31 mL/min — AB (ref >60)
GLUCOSE: 115 mg/dL — AB (ref 65–99)
POTASSIUM: 4.8 mmol/L (ref 3.5–5.1)
Sodium: 125 mmol/L — ABNORMAL LOW (ref 135–145)
TOTAL PROTEIN: 6.4 g/dL — AB (ref 6.5–8.1)

## 2016-12-01 LAB — CBC
HCT: 29.2 % — ABNORMAL LOW (ref 40.0–52.0)
Hemoglobin: 10.2 g/dL — ABNORMAL LOW (ref 13.0–18.0)
MCH: 29.8 pg (ref 26.0–34.0)
MCHC: 34.8 g/dL (ref 32.0–36.0)
MCV: 85.7 fL (ref 80.0–100.0)
Platelets: 254 K/uL (ref 150–440)
RBC: 3.41 MIL/uL — ABNORMAL LOW (ref 4.40–5.90)
RDW: 13.9 % (ref 11.5–14.5)
WBC: 5.8 K/uL (ref 3.8–10.6)

## 2016-12-01 LAB — TYPE AND SCREEN
ABO/RH(D): O POS
ANTIBODY SCREEN: NEGATIVE

## 2016-12-01 MED ORDER — MAGNESIUM OXIDE 400 (241.3 MG) MG PO TABS
400.0000 mg | ORAL_TABLET | Freq: Two times a day (BID) | ORAL | Status: DC
  Administered 2016-12-01 – 2016-12-03 (×4): 400 mg via ORAL
  Filled 2016-12-01 (×4): qty 1

## 2016-12-01 MED ORDER — METOPROLOL TARTRATE 50 MG PO TABS
50.0000 mg | ORAL_TABLET | Freq: Two times a day (BID) | ORAL | Status: DC
  Administered 2016-12-01 – 2016-12-03 (×4): 50 mg via ORAL
  Filled 2016-12-01 (×4): qty 1

## 2016-12-01 MED ORDER — MOMETASONE FURO-FORMOTEROL FUM 200-5 MCG/ACT IN AERO
2.0000 | INHALATION_SPRAY | Freq: Two times a day (BID) | RESPIRATORY_TRACT | Status: DC
  Administered 2016-12-02 – 2016-12-03 (×4): 2 via RESPIRATORY_TRACT
  Filled 2016-12-01: qty 8.8

## 2016-12-01 MED ORDER — FAMOTIDINE 20 MG PO TABS
20.0000 mg | ORAL_TABLET | Freq: Every day | ORAL | Status: DC
  Administered 2016-12-02 – 2016-12-03 (×2): 20 mg via ORAL
  Filled 2016-12-01 (×2): qty 1

## 2016-12-01 MED ORDER — ALBUTEROL SULFATE (2.5 MG/3ML) 0.083% IN NEBU: 2.5000 mg | INHALATION_SOLUTION | Freq: Four times a day (QID) | RESPIRATORY_TRACT | Status: DC | PRN

## 2016-12-01 MED ORDER — HYDRALAZINE HCL 50 MG PO TABS
ORAL_TABLET | ORAL | Status: AC
Start: 2016-12-01 — End: 2016-12-01
  Administered 2016-12-01: 25 mg via ORAL
  Filled 2016-12-01: qty 1

## 2016-12-01 MED ORDER — LEVOTHYROXINE SODIUM 50 MCG PO TABS
50.0000 ug | ORAL_TABLET | Freq: Every day | ORAL | Status: DC
  Administered 2016-12-02 – 2016-12-03 (×2): 50 ug via ORAL
  Filled 2016-12-01 (×2): qty 1

## 2016-12-01 MED ORDER — ATORVASTATIN CALCIUM 20 MG PO TABS
40.0000 mg | ORAL_TABLET | Freq: Every evening | ORAL | Status: DC
  Administered 2016-12-01 – 2016-12-02 (×2): 40 mg via ORAL
  Filled 2016-12-01 (×2): qty 2

## 2016-12-01 MED ORDER — HYDRALAZINE HCL 50 MG PO TABS
25.0000 mg | ORAL_TABLET | Freq: Once | ORAL | Status: AC
  Administered 2016-12-01: 25 mg via ORAL

## 2016-12-01 MED ORDER — HYDRALAZINE HCL 20 MG/ML IJ SOLN
10.0000 mg | Freq: Four times a day (QID) | INTRAMUSCULAR | Status: DC | PRN
  Filled 2016-12-01: qty 1

## 2016-12-01 MED ORDER — ISOSORBIDE MONONITRATE ER 30 MG PO TB24
30.0000 mg | ORAL_TABLET | Freq: Every day | ORAL | Status: DC
  Administered 2016-12-02 – 2016-12-03 (×2): 30 mg via ORAL
  Filled 2016-12-01 (×2): qty 1

## 2016-12-01 MED ORDER — ALBUTEROL SULFATE HFA 108 (90 BASE) MCG/ACT IN AERS: 2.0000 | INHALATION_SPRAY | Freq: Four times a day (QID) | RESPIRATORY_TRACT | Status: DC | PRN

## 2016-12-01 MED ORDER — TIOTROPIUM BROMIDE MONOHYDRATE 1.25 MCG/ACT IN AERS
1.0000 | INHALATION_SPRAY | Freq: Every day | RESPIRATORY_TRACT | Status: DC
  Administered 2016-12-02 – 2016-12-03 (×2): 1 via RESPIRATORY_TRACT
  Filled 2016-12-01: qty 1

## 2016-12-01 MED ORDER — PANTOPRAZOLE SODIUM 40 MG IV SOLR
40.0000 mg | Freq: Two times a day (BID) | INTRAVENOUS | Status: DC
  Administered 2016-12-01 – 2016-12-02 (×3): 40 mg via INTRAVENOUS
  Filled 2016-12-01 (×4): qty 40

## 2016-12-01 MED ORDER — HYDRALAZINE HCL 50 MG PO TABS
50.0000 mg | ORAL_TABLET | Freq: Three times a day (TID) | ORAL | Status: DC
  Administered 2016-12-01 – 2016-12-03 (×5): 50 mg via ORAL
  Filled 2016-12-01 (×5): qty 1

## 2016-12-01 MED ORDER — IOPAMIDOL (ISOVUE-300) INJECTION 61%
15.0000 mL | INTRAVENOUS | Status: AC
  Administered 2016-12-01 (×2): 15 mL via ORAL

## 2016-12-01 NOTE — ED Notes (Signed)
Pt states last week noticed black stool. Then yesterday noticed it again. States chronic nausea. States stool has been in little balls. Pt also states lower abd pain. Pt lying on stretcher, no apparent distress noted. Denies vomiting and fevers.

## 2016-12-01 NOTE — H&P (Signed)
[facility] [place] NAME: [name]    MR#:  [id number]  DATE OF BIRTH:  24-Nov-1935  DATE OF ADMISSION:  12/01/2016  PRIMARY CARE PHYSICIAN: Marguerita Merles, MD   REQUESTING/REFERRING PHYSICIAN: malinda  CHIEF COMPLAINT:   Chief Complaint  Patient presents with  . Rectal Bleeding    HISTORY OF PRESENT ILLNESS: [name]  is a 81 y.o. male with a known history of , Hyperlipidemia, hypertension, bipolar disorder, CHF, COPD, coronary artery disease- lives at home and has a visiting nurse coming twice weekly. Independent in day-to-day activities and walks with a walker. Last night he noticed his stool is dark colored and lose almost watery. He also had one more episode 1 week ago but nothing in between so he did not pay much attention. When it happened last night again he spoke to his visiting nurse today morning about this, she called his primary care physician Dr. Delight Stare and she suggested to send him to emergency room for further workup. His hemoglobin is stable, and vitals are stable to. He denies any abdominal pain or nausea or vomiting. He is given for observation in hospital for a GI bleed.  He denies use of over-the-counter pain medications or BC powders. He said he had a colonoscopy done a few years ago but does not remember any abnormalities in there.  PAST MEDICAL HISTORY:   Past Medical History:  Diagnosis Date  . Alcoholism ([place])   . Asthma   . Bipolar affective disorder ([place])   . CHF (congestive heart failure) ([place])   . COPD (chronic obstructive pulmonary disease) (H[facility])    COPD  . Coronary artery disease February 2005   PCI and Taxus drug-eluting stent placement to the distal RCA (3.5 x 12 mm)  . Degenerative joint disease   . Essential hypertension   . Hyperlipidemia   . Hypertension   . Prostate CA [facility])    prostate ca dx 63 ys ago;  . Prostate CA [facility])    prostate ca dx 20 yrs ago  . PVD (peripheral vascular  disease) ([place])   . Tobacco abuse     PAST SURGICAL HISTORY: Past Surgical History:  Procedure Laterality Date  . CARDIAC CATHETERIZATION    . CORONARY ANGIOPLASTY WITH STENT PLACEMENT    . TOTAL HIP ARTHROPLASTY     right    SOCIAL HISTORY:  Social History  Substance Use Topics  . Smoking status: Former Smoker    Packs/day: 1.00    Years: 50.00    Types: Cigarettes  . Smokeless tobacco: Never Used  . Alcohol use Yes     Comment: social     FAMILY HISTORY:  Family History  Problem Relation Age of Onset  . Hypertension Mother   . Hyperlipidemia Mother   . Heart attack Mother   . Hypertension Father   . Hyperlipidemia Father   . Heart attack Father     DRUG ALLERGIES:  Allergies  Allergen Reactions  . Amlodipine     Constipation   . Benazepril-Hydrochlorothiazide     Constipation   . Chlorthalidone     Hyponatremia  . Other Other (See Comments)    "ANY BLOOD PRESSURE MEDICATIONS THAT I'VE TRIED" - PT. DOES NOT REMEMBER WHICH ONES    REVIEW OF SYSTEMS:   CONSTITUTIONAL: No fever, fatigue or weakness.  EYES: No blurred or double vision.  EARS, NOSE, AND THROAT: No tinnitus or ear pain.  RESPIRATORY: No cough, shortness  of breath, wheezing or hemoptysis.  CARDIOVASCULAR: No chest pain, orthopnea, edema.  GASTROINTESTINAL: No nausea, vomiting, diarrhea or abdominal pain. Dark and loose stool. GENITOURINARY: No dysuria, hematuria.  ENDOCRINE: No polyuria, nocturia,  HEMATOLOGY: No anemia, easy bruising or bleeding SKIN: No rash or lesion. MUSCULOSKELETAL: No joint pain or arthritis.   NEUROLOGIC: No tingling, numbness, weakness.  PSYCHIATRY: No anxiety or depression.   MEDICATIONS AT HOME:  Prior to Admission medications   Medication Sig Start Date End Date Taking? Authorizing Provider  albuterol (PROAIR HFA) 108 (90 Base) MCG/ACT inhaler Inhale 2 puffs into the lungs every 6 (six) hours as needed for wheezing or shortness of breath. Reported on 04/21/2016  01/21/16 01/20/17 Yes Historical Provider, MD  aspirin EC 81 MG tablet Take 81 mg by mouth every morning.   Yes Historical Provider, MD  atorvastatin (LIPITOR) 40 MG tablet Take 40 mg by mouth daily.   Yes Historical Provider, MD  famotidine (PEPCID) 20 MG tablet Take 1 tablet (20 mg total) by mouth daily. 09/07/16  Yes Epifanio Lesches, MD  fluticasone-salmeterol (ADVAIR HFA) 154-00 MCG/ACT inhaler Inhale 2 puffs into the lungs 2 (two) times daily.   Yes Historical Provider, MD  furosemide (LASIX) 40 MG tablet Take 0.5 tablets (20 mg total) by mouth 2 (two) times daily. 09/13/16  Yes Dustin Flock, MD  hydrALAZINE (APRESOLINE) 25 MG tablet Take 2 tablets (50 mg total) by mouth every 8 (eight) hours. 10/30/16  Yes Alisa Graff, FNP  isosorbide mononitrate (IMDUR) 30 MG 24 hr tablet Take 1 tablet (30 mg total) by mouth daily. 11/23/16 02/21/17 Yes [name], MD  ketoconazole (NIZORAL) 2 % shampoo Apply 1 application topically 3 (three) times a week.  11/21/16  Yes Historical Provider, MD  levothyroxine (SYNTHROID, LEVOTHROID) 50 MCG tablet Take 50 mcg by mouth daily before breakfast.   Yes Historical Provider, MD  magnesium oxide (MAG-OX) 400 MG tablet Take 400 mg by mouth 2 (two) times daily.    Yes Historical Provider, MD  metoprolol (LOPRESSOR) 50 MG tablet Take 1 tablet (50 mg total) by mouth 2 (two) times daily. 09/06/16  Yes Epifanio Lesches, MD  nitroGLYCERIN (NITROSTAT) 0.4 MG SL tablet Take 0.4 mg by mouth every 5 (five) minutes x 3 doses as needed for chest pain. As needed for chest pain 12/06/15  Yes Historical Provider, MD  Tiotropium Bromide Monohydrate (SPIRIVA RESPIMAT) 1.25 MCG/ACT AERS Inhale 1 puff into the lungs daily.   Yes Historical Provider, MD  vitamin B-6 (PYRIDOXINE) 25 MG tablet Take 25 mg by mouth daily.   Yes Historical Provider, MD      PHYSICAL EXAMINATION:   VITAL SIGNS: Blood pressure (!) 183/76, pulse 61, temperature 98 F (36.7 C), temperature source Oral,  resp. rate 16, height 5\' 7"  (1.702 m), weight 133.8 kg (295 lb), SpO2 98 %.  GENERAL:  81 y.o.-year-old patient lying in the bed with no acute distress.  EYES: Pupils equal, round, reactive to light and accommodation. No scleral icterus. Extraocular muscles intact.  HEENT: Head atraumatic, normocephalic. Oropharynx and nasopharynx clear.  NECK:  Supple, no jugular venous distention. No thyroid enlargement, no tenderness.  LUNGS: Normal breath sounds bilaterally, no wheezing, rales,rhonchi or crepitation. No use of accessory muscles of respiration.  CARDIOVASCULAR: S1, S2 normal. No murmurs, rubs, or gallops.  ABDOMEN: Soft, nontender, nondistended. Bowel sounds present. No organomegaly or mass.  EXTREMITIES: No pedal edema, cyanosis, or clubbing.  NEUROLOGIC: Cranial nerves II through XII are intact. Muscle strength 5/5 in  all extremities. Sensation intact. Gait not checked.  PSYCHIATRIC: The patient is alert and oriented x 3.  SKIN: No obvious rash, lesion, or ulcer.   LABORATORY PANEL:   CBC  Recent Labs Lab 12/01/16 1244  WBC 5.8  HGB 10.2*  HCT 29.2*  PLT 254  MCV 85.7  MCH 29.8  MCHC 34.8  RDW 13.9   ------------------------------------------------------------------------------------------------------------------  Chemistries   Recent Labs Lab 12/01/16 1244  NA 125*  K 4.8  CL 97*  CO2 22  GLUCOSE 115*  BUN 41*  CREATININE 1.93*  CALCIUM 7.9*  AST 26  ALT 18  ALKPHOS 107  BILITOT 0.8   ------------------------------------------------------------------------------------------------------------------ estimated creatinine clearance is 40.9 mL/min (by C-G formula based on SCr of 1.93 mg/dL (H)). ------------------------------------------------------------------------------------------------------------------ No results for input(s): TSH, T4TOTAL, T3FREE, THYROIDAB in the last 72 hours.  Invalid input(s): FREET3   Coagulation profile No results for input(s):  INR, PROTIME in the last 168 hours. ------------------------------------------------------------------------------------------------------------------- No results for input(s): DDIMER in the last 72 hours. -------------------------------------------------------------------------------------------------------------------  Cardiac Enzymes No results for input(s): CKMB, TROPONINI, MYOGLOBIN in the last 168 hours.  Invalid input(s): CK ------------------------------------------------------------------------------------------------------------------ Invalid input(s): POCBNP  ---------------------------------------------------------------------------------------------------------------  Urinalysis    Component Value Date/Time   COLORURINE AMBER (A) 08/30/2016 2249   APPEARANCEUR TURBID (A) 08/30/2016 2249   APPEARANCEUR Clear 04/03/2015 1216   LABSPEC 1.003 (L) 08/30/2016 2249   LABSPEC 1.004 04/03/2015 1216   PHURINE 7.0 08/30/2016 2249   GLUCOSEU NEGATIVE 08/30/2016 2249   GLUCOSEU Negative 04/03/2015 1216   HGBUR 2+ (A) 08/30/2016 2249   BILIRUBINUR NEGATIVE 08/30/2016 2249   BILIRUBINUR Negative 04/03/2015 1216   KETONESUR NEGATIVE 08/30/2016 2249   PROTEINUR 100 (A) 08/30/2016 2249   UROBILINOGEN 0.2 03/27/2011 0845   NITRITE POSITIVE (A) 08/30/2016 2249   LEUKOCYTESUR 3+ (A) 08/30/2016 2249   LEUKOCYTESUR Negative 04/03/2015 1216     RADIOLOGY: Ct Abdomen Pelvis Wo Contrast  Result Date: 12/01/2016 CLINICAL DATA:  Generalized abdominal pain, bloody diarrhea for 1 week. EXAM: CT ABDOMEN AND PELVIS WITHOUT CONTRAST TECHNIQUE: Multidetector CT imaging of the abdomen and pelvis was performed following the standard protocol without IV contrast. COMPARISON:  None. FINDINGS: Lower chest: No acute abnormality. Hepatobiliary: No focal liver abnormality is seen. No gallstones, gallbladder wall thickening, or biliary dilatation. Pancreas: Unremarkable. No pancreatic ductal dilatation or  surrounding inflammatory changes. Spleen: Calcified splenic granulomata are noted without other focal abnormality. Adrenals/Urinary Tract: Adrenal glands are unremarkable. Stable bilateral renal cysts are noted. No hydronephrosis or renal obstruction is noted. No renal or ureteral calculi are noted. Urinary bladder is unremarkable. Stomach/Bowel: Stomach is within normal limits. Appendix appears normal. No evidence of bowel wall thickening, distention, or inflammatory changes. Diverticulosis of descending and sigmoid colon is noted. Vascular/Lymphatic: Aortic atherosclerosis. No enlarged abdominal or pelvic lymph nodes. Reproductive: Penile reservoir is noted in left-sided pelvis. Small prostate gland is noted. Other: No abdominal wall hernia or abnormality. No abdominopelvic ascites. Musculoskeletal: Status post total right hip arthroplasty. Status post surgical fixation of proximal left femoral neck. IMPRESSION: Aortic atherosclerosis. Stable bilateral renal cysts. Sigmoid diverticulosis without inflammation. No acute abnormality seen in the abdomen or pelvis. Electronically Signed   By: Marijo Conception, M.D.   On: 12/01/2016 17:12    EKG: Orders placed or performed during the hospital encounter of 12/01/16  . ED EKG  . ED EKG    IMPRESSION AND PLAN:  * GI bleed likely diverticular.   Hemodynamically stable and hemoglobin is stable 2.   Usually this  type of bleeding stopped by itself so we would like to just observe him with the hemoglobin follow-ups.   Give IV Protonix twice a day and keep on liquid diet for now.   If he has more bleeding or hemoglobin drops and he may need to consider calling GI consult.   Otherwise he can follow-up in GI clinic.  * Uncontrolled hypertension   Continue home medication, added hydralazine IV as needed for better control.  * History of coronary artery disease   Continue home medications except aspirin.  * History of congestive heart failure   No  exacerbation symptoms at this point. Continue home medication.  * COPD     No exacerbation, continue inhalers.    All the records are reviewed and case discussed with ED provider. Management plans discussed with the patient, family and they are in agreement.  CODE STATUS:Full code  Code Status History    Date Active Date Inactive Code Status Order ID Comments User Context   09/12/2016  8:42 AM 09/13/2016  8:30 PM Full Code [id number]  Alonna Buckler, RN Inpatient   09/11/2016  8:46 AM 09/11/2016  4:59 PM Full Code [id number]  Dustin Flock, MD ED   08/31/2016  3:33 AM 09/06/2016  5:14 PM Full Code [id number]  Saundra Shelling, MD Inpatient    Advance Directive Documentation   Flowsheet Row Most Recent Value  Type of Advance Directive  Healthcare Power of Attorney, Living will  Pre-existing out of facility DNR order (yellow form or pink MOST form)  No data  "MOST" Form in Place?  No data       TOTAL TIME TAKING CARE OF THIS PATIENT: 50  minutes.    Vaughan Basta M.D on 12/01/2016   Between 7am to 6pm - Pager - [phone]  After 6pm go to [email or web address] - password [email or web address] [facility]  Office  [phone]  CC: Primary care physician; Marguerita Merles, MD   Note: This dictation was prepared with Dragon dictation along with smaller phrase technology. Any transcriptional errors that result from this process are unintentional.

## 2016-12-01 NOTE — ED Triage Notes (Signed)
Pt comes into the ED via POV c/o possible rectal bleeding.  Patient states he has been having black diarrhea since last night.  Called his doctor this morning who sent him here to be checked for rectal bleeding.  Patient denies abdominal pain but states he has been mildly nauseas. History of gastritis.

## 2016-12-01 NOTE — ED Notes (Signed)
Pt starting 2nd bottle of contrast now.

## 2016-12-01 NOTE — ED Notes (Signed)
Transporting patient to room 225-2C

## 2016-12-01 NOTE — ED Provider Notes (Signed)
[facility] Emergency Department Provider Note   ____________________________________________   First MD Initiated Contact with Patient 12/01/16 1450     (approximate)  I have reviewed the triage vital signs and the nursing notes.   HISTORY  Chief Complaint Rectal Bleeding    HPI [name] is a 81 y.o. male who reports onset of black diarrhea yesterday. He feels lightheaded. He is not running a fever he says. His belly is not distended. It is intermittently tender. It is tender at present has been tender for about an hour he says. Tenderness is moderate and diffuse. Patient is very concerned about a lump on the back of his head. On palpation of his head close examinations and I do not see a lump present. Possibly there is slight asymmetry of the suture lines in the back of the head but do not see anything that looks worrisome.  Past Medical History:  Diagnosis Date  . Alcoholism ([place])   . Asthma   . Bipolar affective disorder ([place])   . COPD (chronic obstructive pulmonary disease) (H[facility])    COPD  . Coronary artery disease February 2005   PCI and Taxus drug-eluting stent placement to the distal RCA (3.5 x 12 mm)  . Degenerative joint disease   . Essential hypertension   . Hyperlipidemia   . Hypertension   . Prostate CA [facility])    prostate ca dx 44 ys ago;  . Prostate CA [facility])    prostate ca dx 20 yrs ago  . PVD (peripheral vascular disease) ([place])   . Tobacco abuse     Patient Active Problem List   Diagnosis Date Noted  . Alcohol abuse 10/30/2016  . Chronic systolic heart failure ([place]) 10/04/2016  . COPD (chronic obstructive pulmonary disease) with chronic bronchitis ([place]) 10/04/2016  . Obstructive sleep apnea 10/04/2016  . Hyponatremia 08/31/2016  . UTI (lower urinary tract infection) 08/31/2016  . Exertional dyspnea 02/11/2016  . Hyperlipidemia 12/03/2015  . Essential hypertension   . Coronary artery disease 01/19/2004    Past Surgical  History:  Procedure Laterality Date  . CARDIAC CATHETERIZATION    . CORONARY ANGIOPLASTY WITH STENT PLACEMENT    . TOTAL HIP ARTHROPLASTY     right    Prior to Admission medications   Medication Sig Start Date End Date Taking? Authorizing Provider  albuterol (PROAIR HFA) 108 (90 Base) MCG/ACT inhaler Inhale 2 puffs into the lungs every 6 (six) hours as needed for wheezing or shortness of breath. Reported on 04/21/2016 01/21/16 01/20/17  Historical Provider, MD  aspirin EC 81 MG tablet Take 81 mg by mouth every morning.    Historical Provider, MD  atorvastatin (LIPITOR) 40 MG tablet Take 40 mg by mouth daily.    Historical Provider, MD  famotidine (PEPCID) 20 MG tablet Take 1 tablet (20 mg total) by mouth daily. 09/07/16   Epifanio Lesches, MD  fluticasone-salmeterol (ADVAIR HFA) 097-35 MCG/ACT inhaler Inhale 2 puffs into the lungs 2 (two) times daily.    Historical Provider, MD  furosemide (LASIX) 40 MG tablet Take 0.5 tablets (20 mg total) by mouth 2 (two) times daily. 09/13/16   Dustin Flock, MD  hydrALAZINE (APRESOLINE) 25 MG tablet Take 2 tablets (50 mg total) by mouth every 8 (eight) hours. 10/30/16   Alisa Graff, FNP  isosorbide mononitrate (IMDUR) 30 MG 24 hr tablet Take 1 tablet (30 mg total) by mouth daily. 11/23/16 02/21/17  [name], MD  ketoconazole (NIZORAL) 2 % shampoo  Apply 1 application topically 3 (three) times a week.  11/21/16   Historical Provider, MD  levothyroxine (SYNTHROID, LEVOTHROID) 50 MCG tablet Take 50 mcg by mouth daily before breakfast.    Historical Provider, MD  magnesium oxide (MAG-OX) 400 MG tablet Take 400 mg by mouth 2 (two) times daily.     Historical Provider, MD  metoprolol (LOPRESSOR) 50 MG tablet Take 1 tablet (50 mg total) by mouth 2 (two) times daily. 09/06/16   Epifanio Lesches, MD  nitroGLYCERIN (NITROSTAT) 0.4 MG SL tablet Take 0.4 mg by mouth every 5 (five) minutes x 3 doses as needed for chest pain. As needed for chest pain 12/06/15    Historical Provider, MD  tiotropium (SPIRIVA) 18 MCG inhalation capsule Place 18 mcg into inhaler and inhale daily.    Historical Provider, MD  vitamin B-6 (PYRIDOXINE) 25 MG tablet Take 25 mg by mouth daily.    Historical Provider, MD    Allergies Amlodipine; Benazepril-hydrochlorothiazide; Chlorthalidone; and Other  Family History  Problem Relation Age of Onset  . Hypertension Mother   . Hyperlipidemia Mother   . Heart attack Mother   . Hypertension Father   . Hyperlipidemia Father   . Heart attack Father     Social History Social History  Substance Use Topics  . Smoking status: Former Smoker    Packs/day: 1.00    Years: 50.00    Types: Cigarettes  . Smokeless tobacco: Never Used  . Alcohol use Yes     Comment: social     Review of Systems Constitutional: No fever/chills Eyes: No visual changes. ENT: No sore throat. Cardiovascular: Denies chest pain. Respiratory: Denies shortness of breath. Gastrointestinal abdominal pain.  No nausea, no vomiting.diarrhea.  No constipation. Genitourinary: Negative for dysuria. Musculoskeletal: Negative for back pain. Skin: Negative for rash. Neurological: Negative for headaches, focal weakness or numbness.  10-point ROS otherwise negative.  ____________________________________________   PHYSICAL EXAM:  VITAL SIGNS: ED Triage Vitals  Enc Vitals Group     BP 12/01/16 1240 (!) 140/50     Pulse Rate 12/01/16 1240 62     Resp 12/01/16 1240 18     Temp 12/01/16 1240 98 F (36.7 C)     Temp Source 12/01/16 1240 Oral     SpO2 12/01/16 1240 97 %     Weight 12/01/16 1241 295 lb (133.8 kg)     Height 12/01/16 1241 5\' 7"  (1.702 m)     Head Circumference --      Peak Flow --      Pain Score --      Pain Loc --      Pain Edu? --      Excl. in [place]? --    Constitutional: Alert and oriented. Well appearing and in no acute distress. Eyes: Conjunctivae are normal. PERRL. EOMI. Head: Atraumatic.See history of present illness Nose:  No congestion/rhinnorhea. Mouth/Throat: Mucous membranes are moist.  Oropharynx non-erythematous. Neck: No stridor. Cardiovascular: Normal rate, regular rhythm. Grossly normal heart sounds.  Good peripheral circulation. Respiratory: Normal respiratory effort.  No retractions. Lungs CTAB. Gastrointestinal: Soft mild diffuse tenderness. Patient reports no change in his usual abdominal size No distention. No abdominal bruits. No CVA tenderness. Rectal: Patient has black diarrheal stool which is Hemoccult positive  Musculoskeletal: No lower extremity tenderness nor edema.  No joint effusions. Neurologic:  Normal speech and language. No gross focal neurologic deficits are appreciated. No gait instability. Skin:  Skin is warm, dry and intact. No rash noted.  ____________________________________________   LABS (all labs ordered are listed, but only abnormal results are displayed)  Labs Reviewed  COMPREHENSIVE METABOLIC PANEL - Abnormal; Notable for the following:       Result Value   Sodium 125 (*)    Chloride 97 (*)    Glucose, Bld 115 (*)    BUN 41 (*)    Creatinine, Ser 1.93 (*)    Calcium 7.9 (*)    Total Protein 6.4 (*)    Albumin 3.1 (*)    GFR calc non Af Amer 31 (*)    GFR calc Af Amer 36 (*)    All other components within normal limits  CBC - Abnormal; Notable for the following:    RBC 3.41 (*)    Hemoglobin 10.2 (*)    HCT 29.2 (*)    All other components within normal limits  POC OCCULT BLOOD, ED  TYPE AND SCREEN   ____________________________________________  EKG  EKG read and interpreted by me shows normal sinus rhythm rate of 61 normal axis no acute ST-T wave changes there is at least one premature atrial complex ____________________________________________  RADIOLOGY  Study Result   CLINICAL DATA:  Generalized abdominal pain, bloody diarrhea for 1 week.  EXAM: CT ABDOMEN AND PELVIS WITHOUT CONTRAST  TECHNIQUE: Multidetector CT imaging of the abdomen  and pelvis was performed following the standard protocol without IV contrast.  COMPARISON:  None.  FINDINGS: Lower chest: No acute abnormality.  Hepatobiliary: No focal liver abnormality is seen. No gallstones, gallbladder wall thickening, or biliary dilatation.  Pancreas: Unremarkable. No pancreatic ductal dilatation or surrounding inflammatory changes.  Spleen: Calcified splenic granulomata are noted without other focal abnormality.  Adrenals/Urinary Tract: Adrenal glands are unremarkable. Stable bilateral renal cysts are noted. No hydronephrosis or renal obstruction is noted. No renal or ureteral calculi are noted. Urinary bladder is unremarkable.  Stomach/Bowel: Stomach is within normal limits. Appendix appears normal. No evidence of bowel wall thickening, distention, or inflammatory changes. Diverticulosis of descending and sigmoid colon is noted.  Vascular/Lymphatic: Aortic atherosclerosis. No enlarged abdominal or pelvic lymph nodes.  Reproductive: Penile reservoir is noted in left-sided pelvis. Small prostate gland is noted.  Other: No abdominal wall hernia or abnormality. No abdominopelvic ascites.  Musculoskeletal: Status post total right hip arthroplasty. Status post surgical fixation of proximal left femoral neck.  IMPRESSION: Aortic atherosclerosis.  Stable bilateral renal cysts.  Sigmoid diverticulosis without inflammation.  No acute abnormality seen in the abdomen or pelvis.   Electronically Signed   By: Marijo Conception, M.D.   On: 12/01/2016 17:12     ____________________________________________   PROCEDURES  Procedure(s) performed:   Procedures  Critical Care performed:  ____________________________________________   INITIAL IMPRESSION / ASSESSMENT AND PLAN / ED COURSE  Pertinent labs & imaging results that were available during my care of the patient were reviewed by me and considered in my medical decision  making (see chart for details).    Clinical Course      ____________________________________________   FINAL CLINICAL IMPRESSION(S) / ED DIAGNOSES  Final diagnoses:  Hyponatremia  Rectal bleeding      NEW MEDICATIONS STARTED DURING THIS VISIT:  New Prescriptions   No medications on file     Note:  This document was prepared using Dragon voice recognition software and may include unintentional dictation errors.    Nena Polio, MD 12/01/16 [phone]

## 2016-12-01 NOTE — ED Notes (Signed)
No orders for urine, but urine sample sent to lab just in case. Yellow, clear.

## 2016-12-01 NOTE — ED Notes (Signed)
Pt given urinal to urinate in. Informed to sit on side of bed as long as he was feeling ok.

## 2016-12-01 NOTE — ED Notes (Signed)
CT notified that pt finished contrast ?

## 2016-12-02 LAB — CBC
HEMATOCRIT: 27.8 % — AB (ref 40.0–52.0)
HEMOGLOBIN: 9.7 g/dL — AB (ref 13.0–18.0)
MCH: 29.7 pg (ref 26.0–34.0)
MCHC: 34.8 g/dL (ref 32.0–36.0)
MCV: 85.3 fL (ref 80.0–100.0)
Platelets: 232 10*3/uL (ref 150–440)
RBC: 3.25 MIL/uL — AB (ref 4.40–5.90)
RDW: 14.4 % (ref 11.5–14.5)
WBC: 6 10*3/uL (ref 3.8–10.6)

## 2016-12-02 LAB — BASIC METABOLIC PANEL
ANION GAP: 6 (ref 5–15)
BUN: 34 mg/dL — ABNORMAL HIGH (ref 6–20)
CALCIUM: 8.3 mg/dL — AB (ref 8.9–10.3)
CO2: 24 mmol/L (ref 22–32)
Chloride: 102 mmol/L (ref 101–111)
Creatinine, Ser: 1.83 mg/dL — ABNORMAL HIGH (ref 0.61–1.24)
GFR calc non Af Amer: 33 mL/min — ABNORMAL LOW (ref >60)
GFR, EST AFRICAN AMERICAN: 39 mL/min — AB (ref >60)
GLUCOSE: 100 mg/dL — AB (ref 65–99)
POTASSIUM: 4.6 mmol/L (ref 3.5–5.1)
Sodium: 132 mmol/L — ABNORMAL LOW (ref 135–145)

## 2016-12-02 NOTE — Progress Notes (Signed)
Patient's Tiotropium respimat inhaler has been labeled for use and sent to unit.  Lenis Noon, PharmD 12/02/16 10:56 AM

## 2016-12-02 NOTE — Progress Notes (Signed)
[place] at [facility]                                                                                                                                                                                  Patient Demographics   [name], is a 81 y.o. male, DOB - September 01, 1935, FGH:[id number]  Admit date - 12/01/2016   Admitting Physician Vaughan Basta, MD  Outpatient Primary MD for the patient is Marguerita Merles, MD   LOS - 0  Subjective: Patient admitted with a GI bleed no further bleeding however his hemoglobin has drifted down. Denies abdominal pain no nausea vomiting    Review of Systems:   CONSTITUTIONAL: No documented fever. No fatigue, weakness. No weight gain, no weight loss.  EYES: No blurry or double vision.  ENT: No tinnitus. No postnasal drip. No redness of the oropharynx.  RESPIRATORY: No cough, no wheeze, no hemoptysis. No dyspnea.  CARDIOVASCULAR: No chest pain. No orthopnea. No palpitations. No syncope.  GASTROINTESTINAL: No nausea, no vomiting or diarrhea. No abdominal pain. No melena or hematochezia. Bright red blood per rectum resolved GENITOURINARY: No dysuria or hematuria.  ENDOCRINE: No polyuria or nocturia. No heat or cold intolerance.  HEMATOLOGY: No anemia. No bruising. No bleeding.  INTEGUMENTARY: No rashes. No lesions.  MUSCULOSKELETAL: No arthritis. No swelling. No gout.  NEUROLOGIC: No numbness, tingling, or ataxia. No seizure-type activity.  PSYCHIATRIC: No anxiety. No insomnia. No ADD.    Vitals:   Vitals:   12/01/16 2328 12/02/16 0011 12/02/16 0506 12/02/16 0800  BP: 127/76 (!) 145/63 (!) 147/56 (!) 160/93  Pulse: 72 (!) 58 63 64  Resp:   18 19  Temp:   98.2 F (36.8 C) 97.7 F (36.5 C)  TempSrc:   Oral Oral  SpO2:   100% 97%  Weight:      Height:        Wt Readings from Last 3 Encounters:  12/01/16 295 lb (133.8 kg)  11/23/16 295 lb 8 oz (134 kg)  10/30/16 296 lb (134.3 kg)     Intake/Output Summary  (Last 24 hours) at 12/02/16 1259 Last data filed at 12/02/16 0959  Gross per 24 hour  Intake              750 ml  Output             1150 ml  Net             -400 ml    Physical Exam:   GENERAL: Pleasant-appearing in no apparent distress.  HEAD, EYES, EARS, NOSE AND THROAT: Atraumatic, normocephalic. Extraocular muscles are intact. Pupils equal and reactive to light. Sclerae anicteric.  No conjunctival injection. No oro-pharyngeal erythema.  NECK: Supple. There is no jugular venous distention. No bruits, no lymphadenopathy, no thyromegaly.  HEART: Regular rate and rhythm,. No murmurs, no rubs, no clicks.  LUNGS: Clear to auscultation bilaterally. No rales or rhonchi. No wheezes.  ABDOMEN: Soft, flat, nontender, nondistended. Has good bowel sounds. No hepatosplenomegaly appreciated.  EXTREMITIES: No evidence of any cyanosis, clubbing, or peripheral edema.  +2 pedal and radial pulses bilaterally.  NEUROLOGIC: The patient is alert, awake, and oriented x3 with no focal motor or sensory deficits appreciated bilaterally.  SKIN: Moist and warm with no rashes appreciated.  Psych: Not anxious, depressed LN: No inguinal LN enlargement    Antibiotics   Anti-infectives    None      Medications   Scheduled Meds: . atorvastatin  40 mg Oral QPM  . famotidine  20 mg Oral Daily  . hydrALAZINE  50 mg Oral Q8H  . isosorbide mononitrate  30 mg Oral Daily  . levothyroxine  50 mcg Oral QAC breakfast  . magnesium oxide  400 mg Oral BID  . metoprolol  50 mg Oral BID  . mometasone-formoterol  2 puff Inhalation BID  . pantoprazole (PROTONIX) IV  40 mg Intravenous Q12H  . Tiotropium Bromide Monohydrate  1 puff Inhalation Daily   Continuous Infusions: PRN Meds:.albuterol, hydrALAZINE   Data Review:   Micro Results No results found for this or any previous visit (from the past 240 hour(s)).  Radiology Reports Ct Abdomen Pelvis Wo Contrast  Result Date: 12/01/2016 CLINICAL DATA:   Generalized abdominal pain, bloody diarrhea for 1 week. EXAM: CT ABDOMEN AND PELVIS WITHOUT CONTRAST TECHNIQUE: Multidetector CT imaging of the abdomen and pelvis was performed following the standard protocol without IV contrast. COMPARISON:  None. FINDINGS: Lower chest: No acute abnormality. Hepatobiliary: No focal liver abnormality is seen. No gallstones, gallbladder wall thickening, or biliary dilatation. Pancreas: Unremarkable. No pancreatic ductal dilatation or surrounding inflammatory changes. Spleen: Calcified splenic granulomata are noted without other focal abnormality. Adrenals/Urinary Tract: Adrenal glands are unremarkable. Stable bilateral renal cysts are noted. No hydronephrosis or renal obstruction is noted. No renal or ureteral calculi are noted. Urinary bladder is unremarkable. Stomach/Bowel: Stomach is within normal limits. Appendix appears normal. No evidence of bowel wall thickening, distention, or inflammatory changes. Diverticulosis of descending and sigmoid colon is noted. Vascular/Lymphatic: Aortic atherosclerosis. No enlarged abdominal or pelvic lymph nodes. Reproductive: Penile reservoir is noted in left-sided pelvis. Small prostate gland is noted. Other: No abdominal wall hernia or abnormality. No abdominopelvic ascites. Musculoskeletal: Status post total right hip arthroplasty. Status post surgical fixation of proximal left femoral neck. IMPRESSION: Aortic atherosclerosis. Stable bilateral renal cysts. Sigmoid diverticulosis without inflammation. No acute abnormality seen in the abdomen or pelvis. Electronically Signed   By: Marijo Conception, M.D.   On: 12/01/2016 17:12     CBC  Recent Labs Lab 12/01/16 1244 12/02/16 0536  WBC 5.8 6.0  HGB 10.2* 9.7*  HCT 29.2* 27.8*  PLT 254 232  MCV 85.7 85.3  MCH 29.8 29.7  MCHC 34.8 34.8  RDW 13.9 14.4    Chemistries   Recent Labs Lab 12/01/16 1244 12/02/16 0536  NA 125* 132*  K 4.8 4.6  CL 97* 102  CO2 22 24  GLUCOSE 115*  100*  BUN 41* 34*  CREATININE 1.93* 1.83*  CALCIUM 7.9* 8.3*  AST 26  --   ALT 18  --   ALKPHOS 107  --   BILITOT 0.8  --    ------------------------------------------------------------------------------------------------------------------  estimated creatinine clearance is 43.1 mL/min (by C-G formula based on SCr of 1.83 mg/dL (H)). ------------------------------------------------------------------------------------------------------------------ No results for input(s): HGBA1C in the last 72 hours. ------------------------------------------------------------------------------------------------------------------ No results for input(s): CHOL, HDL, LDLCALC, TRIG, CHOLHDL, LDLDIRECT in the last 72 hours. ------------------------------------------------------------------------------------------------------------------ No results for input(s): TSH, T4TOTAL, T3FREE, THYROIDAB in the last 72 hours.  Invalid input(s): FREET3 ------------------------------------------------------------------------------------------------------------------ No results for input(s): VITAMINB12, FOLATE, FERRITIN, TIBC, IRON, RETICCTPCT in the last 72 hours.  Coagulation profile No results for input(s): INR, PROTIME in the last 168 hours.  No results for input(s): DDIMER in the last 72 hours.  Cardiac Enzymes No results for input(s): CKMB, TROPONINI, MYOGLOBIN in the last 168 hours.  Invalid input(s): CK ------------------------------------------------------------------------------------------------------------------ Invalid input(s): POCBNP    Assessment & Plan   * GI bleed likely diverticular. Hemoglobin has drifted down continue to monitor overnight today with any further significant drop ask GI to see  Patient had a colonoscopy within the last 2 years   *Accelerated hypertension   Continue home medication I will increase his hydralazine 200 every 8 hours  * History of coronary artery disease    Continue home medications except aspirin. Can resume tomorrow if no further bleeding  * History of congestive heart failure   No exacerbation symptoms at this point. Continue home medication.  * COPD     No exacerbation, continue inhalers.      Code Status Orders        Start     Ordered   12/01/16 2042  Full code  Continuous     12/01/16 2041    Code Status History    Date Active Date Inactive Code Status Order ID Comments User Context   09/12/2016  8:42 AM 09/13/2016  8:30 PM Full Code [id number]  Alonna Buckler, RN Inpatient   09/11/2016  8:46 AM 09/11/2016  4:59 PM Full Code [id number]  Dustin Flock, MD ED   08/31/2016  3:33 AM 09/06/2016  5:14 PM Full Code [id number]  Saundra Shelling, MD Inpatient    Advance Directive Documentation   Flowsheet Row Most Recent Value  Type of Advance Directive  Healthcare Power of Attorney  Pre-existing out of facility DNR order (yellow form or pink MOST form)  No data  "MOST" Form in Place?  No data           Consults NONE DVT Prophylaxis SCD'S  Lab Results  Component Value Date   PLT 232 12/02/2016     Time Spent in minutes  35MIN  Greater than 50% of time spent in care coordination and counseling patient regarding the condition and plan of care.   Dustin Flock M.D on 12/02/2016 at 12:59 PM  Between 7am to 6pm - Pager - [phone]  After 6pm go to [email or web address] - password [email or web address]   Office  [phone]

## 2016-12-03 LAB — BASIC METABOLIC PANEL
Anion gap: 6 (ref 5–15)
BUN: 32 mg/dL — AB (ref 6–20)
CALCIUM: 8.5 mg/dL — AB (ref 8.9–10.3)
CO2: 25 mmol/L (ref 22–32)
CREATININE: 2.01 mg/dL — AB (ref 0.61–1.24)
Chloride: 102 mmol/L (ref 101–111)
GFR calc Af Amer: 35 mL/min — ABNORMAL LOW (ref >60)
GFR calc non Af Amer: 30 mL/min — ABNORMAL LOW (ref >60)
GLUCOSE: 102 mg/dL — AB (ref 65–99)
Potassium: 4.7 mmol/L (ref 3.5–5.1)
Sodium: 133 mmol/L — ABNORMAL LOW (ref 135–145)

## 2016-12-03 LAB — CBC
HEMATOCRIT: 29.3 % — AB (ref 40.0–52.0)
Hemoglobin: 10.3 g/dL — ABNORMAL LOW (ref 13.0–18.0)
MCH: 30.1 pg (ref 26.0–34.0)
MCHC: 35 g/dL (ref 32.0–36.0)
MCV: 85.9 fL (ref 80.0–100.0)
Platelets: 243 10*3/uL (ref 150–440)
RBC: 3.41 MIL/uL — ABNORMAL LOW (ref 4.40–5.90)
RDW: 14.1 % (ref 11.5–14.5)
WBC: 6.4 10*3/uL (ref 3.8–10.6)

## 2016-12-03 MED ORDER — ACETAMINOPHEN 325 MG PO TABS
650.0000 mg | ORAL_TABLET | Freq: Four times a day (QID) | ORAL | Status: DC | PRN
  Administered 2016-12-03: 650 mg via ORAL
  Filled 2016-12-03: qty 2

## 2016-12-03 NOTE — Discharge Summary (Signed)
[facility] [place] at [facility], 81 y.o., DOB 1935/11/28, MRN [id number]. Admission date: 12/01/2016 Discharge Date 12/03/2016 Primary MD Marguerita Merles, MD Admitting Physician Vaughan Basta, MD  Admission Diagnosis  Hyponatremia [E87.1] Rectal bleeding [K62.5]  Discharge Diagnosis   Principal Problem:   Lower GI bleed likely related to diverticular bleed now resolved  Accelerated hypertension  Coronary artery disease Chronic systolic CHF COPD Hyponatremia due to dehydration    [name]  is a 81 y.o. male with a known history of , Hyperlipidemia, hypertension, bipolar disorder, CHF, COPD, coronary artery disease- lives at home and has a visiting nurse coming twice weekly. Independent in day-to-day activities and walks with a walker. Last night he noticed his stool is dark colored and lose almost watery. He also had one more episode 1 week ago but nothing in between so he did not pay much attention. Patient came to the ED and had a CT scan which showed diverticulosis The abnormality was noted. Patient's hemoglobin did drop but not significantly he did not requiring transfusion. His stool color return back to normal. Patient having no further bleeding and is stable for discharge home. He'll need outpatient GI follow-up            Consults  None  Significant Tests:  See full reports for all details     Ct Abdomen Pelvis Wo Contrast  Result Date: 12/01/2016 CLINICAL DATA:  Generalized abdominal pain, bloody diarrhea for 1 week. EXAM: CT ABDOMEN AND PELVIS WITHOUT CONTRAST TECHNIQUE: Multidetector CT imaging of the abdomen and pelvis was performed following the standard protocol without IV contrast. COMPARISON:  None. FINDINGS: Lower chest: No acute abnormality. Hepatobiliary: No focal liver abnormality is seen. No gallstones, gallbladder wall thickening, or biliary dilatation. Pancreas: Unremarkable. No pancreatic  ductal dilatation or surrounding inflammatory changes. Spleen: Calcified splenic granulomata are noted without other focal abnormality. Adrenals/Urinary Tract: Adrenal glands are unremarkable. Stable bilateral renal cysts are noted. No hydronephrosis or renal obstruction is noted. No renal or ureteral calculi are noted. Urinary bladder is unremarkable. Stomach/Bowel: Stomach is within normal limits. Appendix appears normal. No evidence of bowel wall thickening, distention, or inflammatory changes. Diverticulosis of descending and sigmoid colon is noted. Vascular/Lymphatic: Aortic atherosclerosis. No enlarged abdominal or pelvic lymph nodes. Reproductive: Penile reservoir is noted in left-sided pelvis. Small prostate gland is noted. Other: No abdominal wall hernia or abnormality. No abdominopelvic ascites. Musculoskeletal: Status post total right hip arthroplasty. Status post surgical fixation of proximal left femoral neck. IMPRESSION: Aortic atherosclerosis. Stable bilateral renal cysts. Sigmoid diverticulosis without inflammation. No acute abnormality seen in the abdomen or pelvis. Electronically Signed   By: Marijo Conception, M.D.   On: 12/01/2016 17:12       Today   Subjective:   [name]  Pt feeling wells no further bleeding  Objective:   Blood pressure (!) 152/69, pulse 64, temperature 98 F (36.7 C), temperature source Oral, resp. rate 18, height 5\' 7"  (1.702 m), weight 295 lb (133.8 kg), SpO2 100 %.  .  Intake/Output Summary (Last 24 hours) at 12/03/16 1722 Last data filed at 12/03/16 1059  Gross per 24 hour  Intake              240 ml  Output             1350 ml  Net            -1110 ml    Exam VITAL  SIGNS: Blood pressure (!) 152/69, pulse 64, temperature 98 F (36.7 C), temperature source Oral, resp. rate 18, height 5\' 7"  (1.702 m), weight 295 lb (133.8 kg), SpO2 100 %.  GENERAL:  81 y.o.-year-old patient lying in the bed with no acute distress.  EYES: Pupils equal, round,  reactive to light and accommodation. No scleral icterus. Extraocular muscles intact.  HEENT: Head atraumatic, normocephalic. Oropharynx and nasopharynx clear.  NECK:  Supple, no jugular venous distention. No thyroid enlargement, no tenderness.  LUNGS: Normal breath sounds bilaterally, no wheezing, rales,rhonchi or crepitation. No use of accessory muscles of respiration.  CARDIOVASCULAR: S1, S2 normal. No murmurs, rubs, or gallops.  ABDOMEN: Soft, nontender, nondistended. Bowel sounds present. No organomegaly or mass.  EXTREMITIES: No pedal edema, cyanosis, or clubbing.  NEUROLOGIC: Cranial nerves II through XII are intact. Muscle strength 5/5 in all extremities. Sensation intact. Gait not checked.  PSYCHIATRIC: The patient is alert and oriented x 3.  SKIN: No obvious rash, lesion, or ulcer.   Data Review     CBC w Diff: Lab Results  Component Value Date   WBC 6.4 12/03/2016   HGB 10.3 (L) 12/03/2016   HGB 13.8 04/03/2015   HCT 29.3 (L) 12/03/2016   HCT 41.2 04/03/2015   PLT 243 12/03/2016   PLT 205 04/03/2015   LYMPHOPCT 17 09/11/2016   LYMPHOPCT 29.0 05/31/2014   MONOPCT 10 09/11/2016   MONOPCT 9.3 05/31/2014   EOSPCT 1 09/11/2016   EOSPCT 3.9 05/31/2014   BASOPCT 0 09/11/2016   BASOPCT 0.7 05/31/2014   CMP: Lab Results  Component Value Date   NA 133 (L) 12/03/2016   NA 137 12/23/2015   NA 125 (L) 04/03/2015   K 4.7 12/03/2016   K 3.6 04/03/2015   CL 102 12/03/2016   CL 95 (L) 04/03/2015   CO2 25 12/03/2016   CO2 25 04/03/2015   BUN 32 (H) 12/03/2016   BUN 15 12/23/2015   BUN 8 04/03/2015   CREATININE 2.01 (H) 12/03/2016   CREATININE 0.83 04/03/2015   PROT 6.4 (L) 12/01/2016   PROT 6.1 04/26/2016   PROT 7.2 04/03/2015   ALBUMIN 3.1 (L) 12/01/2016   ALBUMIN 3.3 (L) 04/26/2016   ALBUMIN 3.5 04/03/2015   BILITOT 0.8 12/01/2016   BILITOT <0.2 04/26/2016   BILITOT 0.9 04/03/2015   ALKPHOS 107 12/01/2016   ALKPHOS 104 04/03/2015   AST 26 12/01/2016   AST 23  04/03/2015   ALT 18 12/01/2016   ALT 17 04/03/2015  .  Micro Results No results found for this or any previous visit (from the past 240 hour(s)).   Code Status History    Date Active Date Inactive Code Status Order ID Comments User Context   12/01/2016  8:42 PM 12/03/2016  4:05 PM Full Code [id number]  Vaughan Basta, MD Inpatient   09/12/2016  8:42 AM 09/13/2016  8:30 PM Full Code [id number]  Alonna Buckler, RN Inpatient   09/11/2016  8:46 AM 09/11/2016  4:59 PM Full Code [id number]  Dustin Flock, MD ED   08/31/2016  3:33 AM 09/06/2016  5:14 PM Full Code [id number]  Saundra Shelling, MD Inpatient    Advance Directive Documentation   Flowsheet Row Most Recent Value  Type of Advance Directive  Healthcare Power of Attorney  Pre-existing out of facility DNR order (yellow form or pink MOST form)  No data  "MOST" Form in Place?  No data          Follow-up Information    MILES,LINDA  M, MD Follow up in 7 day(s).   Specialty:  Family Medicine Contact information: [place] [address] [phone]        Norm Salt, PA Follow up in 2 week(s).   Specialty:  Gastroenterology Why:  bright red blood per rectum          Discharge Medications   Allergies as of 12/03/2016      Reactions   Amlodipine    Constipation   Benazepril-hydrochlorothiazide    Constipation   Chlorthalidone    Hyponatremia   Other Other (See Comments)   "ANY BLOOD PRESSURE MEDICATIONS THAT I'VE TRIED" - PT. DOES NOT REMEMBER WHICH ONES      Medication List    TAKE these medications   aspirin EC 81 MG tablet Take 81 mg by mouth every morning.   atorvastatin 40 MG tablet Commonly known as:  LIPITOR Take 40 mg by mouth daily.   famotidine 20 MG tablet Commonly known as:  PEPCID Take 1 tablet (20 mg total) by mouth daily.   fluticasone-salmeterol 115-21 MCG/ACT inhaler Commonly known as:  ADVAIR HFA Inhale 2 puffs into the lungs 2 (two) times daily.    furosemide 40 MG tablet Commonly known as:  LASIX Take 0.5 tablets (20 mg total) by mouth 2 (two) times daily.   hydrALAZINE 25 MG tablet Commonly known as:  APRESOLINE Take 2 tablets (50 mg total) by mouth every 8 (eight) hours.   isosorbide mononitrate 30 MG 24 hr tablet Commonly known as:  IMDUR Take 1 tablet (30 mg total) by mouth daily.   ketoconazole 2 % shampoo Commonly known as:  NIZORAL Apply 1 application topically 3 (three) times a week.   levothyroxine 50 MCG tablet Commonly known as:  SYNTHROID, LEVOTHROID Take 50 mcg by mouth daily before breakfast.   magnesium oxide 400 MG tablet Commonly known as:  MAG-OX Take 400 mg by mouth 2 (two) times daily.   metoprolol 50 MG tablet Commonly known as:  LOPRESSOR Take 1 tablet (50 mg total) by mouth 2 (two) times daily.   NITROSTAT 0.4 MG SL tablet Generic drug:  nitroGLYCERIN Take 0.4 mg by mouth every 5 (five) minutes x 3 doses as needed for chest pain. As needed for chest pain   PROAIR HFA 108 (90 Base) MCG/ACT inhaler Generic drug:  albuterol Inhale 2 puffs into the lungs every 6 (six) hours as needed for wheezing or shortness of breath. Reported on 04/21/2016   SPIRIVA RESPIMAT 1.25 MCG/ACT Aers Generic drug:  Tiotropium Bromide Monohydrate Inhale 1 puff into the lungs daily.   vitamin B-6 25 MG tablet Commonly known as:  pyridOXINE Take 25 mg by mouth daily.          Total Time in preparing paper work, data evaluation and todays exam - 35 minutes  Dustin Flock M.D on 12/03/2016 at [place] PM  [facility]   Office  [phone]

## 2016-12-03 NOTE — Discharge Instructions (Signed)
[facility] [place] at [facility] ° °DIET:  °Cardiac diet ° °DISCHARGE CONDITION:  °Stable ° °ACTIVITY:  °Activity as tolerated ° °OXYGEN:  °Home Oxygen: No. °  °Oxygen Delivery: room air ° °DISCHARGE LOCATION:  °home  ° ° °ADDITIONAL DISCHARGE INSTRUCTION: ° ° °If you experience worsening of your admission symptoms, develop shortness of breath, life threatening emergency, suicidal or homicidal thoughts you must seek medical attention immediately by calling 911 or calling your MD immediately  if symptoms less severe. ° °You Must read complete instructions/literature along with all the possible adverse reactions/side effects for all the Medicines you take and that have been prescribed to you. Take any new Medicines after you have completely understood and accpet all the possible adverse reactions/side effects.  ° °Please note ° °You were cared for by a hospitalist during your hospital stay. If you have any questions about your discharge medications or the care you received while you were in the hospital after you are discharged, you can call the unit and asked to speak with the hospitalist on call if the hospitalist that took care of you is not available. Once you are discharged, your primary care physician will handle any further medical issues. Please note that NO REFILLS for any discharge medications will be authorized once you are discharged, as it is imperative that you return to your primary care physician (or establish a relationship with a primary care physician if you do not have one) for your aftercare needs so that they can reassess your need for medications and monitor your lab values. ° ° °

## 2016-12-03 NOTE — Progress Notes (Signed)
Patient is to be discharged home today. Patient is in no acute distress at this time, and assessment is unchanged from this morning. Patient's IV is out, home meds have been returned from pharmacy, discharge paperwork has been discussed with patient/family and there are no questions or concerns at this time. Patient will be accompanied downstairs by staff and family via wheelchair.

## 2016-12-15 ENCOUNTER — Other Ambulatory Visit: Payer: Self-pay | Admitting: Neurosurgery

## 2016-12-15 DIAGNOSIS — Z86018 Personal history of other benign neoplasm: Secondary | ICD-10-CM

## 2016-12-20 ENCOUNTER — Emergency Department
Admission: EM | Admit: 2016-12-20 | Discharge: 2016-12-20 | Disposition: A | Discharge status: Home / Self Care | Payer: Medicare Other | Attending: Emergency Medicine | Admitting: Emergency Medicine

## 2016-12-20 DIAGNOSIS — I509 Heart failure, unspecified: Secondary | ICD-10-CM | POA: Insufficient documentation

## 2016-12-20 DIAGNOSIS — Z955 Presence of coronary angioplasty implant and graft: Secondary | ICD-10-CM | POA: Diagnosis not present

## 2016-12-20 DIAGNOSIS — I2510 Atherosclerotic heart disease of native coronary artery without angina pectoris: Secondary | ICD-10-CM | POA: Insufficient documentation

## 2016-12-20 DIAGNOSIS — R110 Nausea: Secondary | ICD-10-CM | POA: Diagnosis not present

## 2016-12-20 DIAGNOSIS — J449 Chronic obstructive pulmonary disease, unspecified: Secondary | ICD-10-CM | POA: Insufficient documentation

## 2016-12-20 DIAGNOSIS — Z8546 Personal history of malignant neoplasm of prostate: Secondary | ICD-10-CM | POA: Insufficient documentation

## 2016-12-20 DIAGNOSIS — Z5321 Procedure and treatment not carried out due to patient leaving prior to being seen by health care provider: Secondary | ICD-10-CM | POA: Insufficient documentation

## 2016-12-20 DIAGNOSIS — R42 Dizziness and giddiness: Secondary | ICD-10-CM | POA: Diagnosis present

## 2016-12-20 DIAGNOSIS — Z87891 Personal history of nicotine dependence: Secondary | ICD-10-CM | POA: Insufficient documentation

## 2016-12-20 DIAGNOSIS — R0602 Shortness of breath: Secondary | ICD-10-CM | POA: Insufficient documentation

## 2016-12-20 DIAGNOSIS — I110 Hypertensive heart disease with heart failure: Secondary | ICD-10-CM | POA: Diagnosis not present

## 2016-12-20 DIAGNOSIS — J45909 Unspecified asthma, uncomplicated: Secondary | ICD-10-CM | POA: Diagnosis not present

## 2016-12-20 LAB — BASIC METABOLIC PANEL
ANION GAP: 8 (ref 5–15)
BUN: 28 mg/dL — ABNORMAL HIGH (ref 6–20)
CHLORIDE: 100 mmol/L — AB (ref 101–111)
CO2: 23 mmol/L (ref 22–32)
CREATININE: 1.94 mg/dL — AB (ref 0.61–1.24)
Calcium: 8.4 mg/dL — ABNORMAL LOW (ref 8.9–10.3)
GFR calc non Af Amer: 31 mL/min — ABNORMAL LOW (ref >60)
GFR, EST AFRICAN AMERICAN: 36 mL/min — AB (ref >60)
Glucose, Bld: 123 mg/dL — ABNORMAL HIGH (ref 65–99)
POTASSIUM: 4 mmol/L (ref 3.5–5.1)
SODIUM: 131 mmol/L — AB (ref 135–145)

## 2016-12-20 LAB — URINALYSIS, COMPLETE (UACMP) WITH MICROSCOPIC
Bilirubin Urine: NEGATIVE
GLUCOSE, UA: NEGATIVE mg/dL
Ketones, ur: NEGATIVE mg/dL
Nitrite: NEGATIVE
PROTEIN: 100 mg/dL — AB
Specific Gravity, Urine: 1.008 (ref 1.005–1.030)
pH: 6 (ref 5.0–8.0)

## 2016-12-20 LAB — CBC
HCT: 29.9 % — ABNORMAL LOW (ref 40.0–52.0)
Hemoglobin: 10.4 g/dL — ABNORMAL LOW (ref 13.0–18.0)
MCH: 29.7 pg (ref 26.0–34.0)
MCHC: 34.7 g/dL (ref 32.0–36.0)
MCV: 85.6 fL (ref 80.0–100.0)
PLATELETS: 225 10*3/uL (ref 150–440)
RBC: 3.49 MIL/uL — AB (ref 4.40–5.90)
RDW: 14.4 % (ref 11.5–14.5)
WBC: 7.4 10*3/uL (ref 3.8–10.6)

## 2016-12-20 NOTE — ED Triage Notes (Signed)
Pt reports to ED w/ c/o an episode of dizziness, nausea w/o emesis, and incr SOB.  Pt alert and oriented, comes to ED w/ family.  Denies pain. Resp even and unlabored, able to speak w/o issue.  NAD.

## 2016-12-21 ENCOUNTER — Telehealth: Payer: Self-pay | Admitting: Emergency Medicine

## 2016-12-21 NOTE — Telephone Encounter (Signed)
Called patient due to lwot to inquire about condition and follow up plans. Left message.   

## 2016-12-27 ENCOUNTER — Ambulatory Visit
Admission: RE | Admit: 2016-12-27 | Discharge: 2016-12-27 | Disposition: A | Discharge status: Home / Self Care | Payer: Medicare Other | Source: Ambulatory Visit | Attending: Neurosurgery | Admitting: Neurosurgery

## 2016-12-27 DIAGNOSIS — Z86018 Personal history of other benign neoplasm: Secondary | ICD-10-CM | POA: Diagnosis present

## 2016-12-27 MED ORDER — GADOBENATE DIMEGLUMINE 529 MG/ML IV SOLN
10.0000 mL | Freq: Once | INTRAVENOUS | Status: AC | PRN
  Administered 2016-12-27: 10 mL via INTRAVENOUS

## 2017-01-03 ENCOUNTER — Ambulatory Visit: Payer: Medicare Other | Admitting: Family

## 2017-01-05 ENCOUNTER — Ambulatory Visit: Payer: Medicare Other | Attending: Family | Admitting: Family

## 2017-01-05 ENCOUNTER — Encounter: Payer: Self-pay | Admitting: Family

## 2017-01-05 VITALS — BP 141/54 | HR 62 | Resp 20 | Ht 67.0 in | Wt 293.0 lb

## 2017-01-05 DIAGNOSIS — I2510 Atherosclerotic heart disease of native coronary artery without angina pectoris: Secondary | ICD-10-CM | POA: Diagnosis not present

## 2017-01-05 DIAGNOSIS — D329 Benign neoplasm of meninges, unspecified: Secondary | ICD-10-CM | POA: Insufficient documentation

## 2017-01-05 DIAGNOSIS — F1010 Alcohol abuse, uncomplicated: Secondary | ICD-10-CM

## 2017-01-05 DIAGNOSIS — I739 Peripheral vascular disease, unspecified: Secondary | ICD-10-CM | POA: Diagnosis not present

## 2017-01-05 DIAGNOSIS — Z7982 Long term (current) use of aspirin: Secondary | ICD-10-CM | POA: Diagnosis not present

## 2017-01-05 DIAGNOSIS — F319 Bipolar disorder, unspecified: Secondary | ICD-10-CM | POA: Diagnosis not present

## 2017-01-05 DIAGNOSIS — I10 Essential (primary) hypertension: Secondary | ICD-10-CM

## 2017-01-05 DIAGNOSIS — Z96641 Presence of right artificial hip joint: Secondary | ICD-10-CM | POA: Diagnosis not present

## 2017-01-05 DIAGNOSIS — Z955 Presence of coronary angioplasty implant and graft: Secondary | ICD-10-CM | POA: Insufficient documentation

## 2017-01-05 DIAGNOSIS — E785 Hyperlipidemia, unspecified: Secondary | ICD-10-CM | POA: Insufficient documentation

## 2017-01-05 DIAGNOSIS — Z86011 Personal history of benign neoplasm of the brain: Secondary | ICD-10-CM | POA: Insufficient documentation

## 2017-01-05 DIAGNOSIS — J449 Chronic obstructive pulmonary disease, unspecified: Secondary | ICD-10-CM | POA: Insufficient documentation

## 2017-01-05 DIAGNOSIS — M1990 Unspecified osteoarthritis, unspecified site: Secondary | ICD-10-CM | POA: Diagnosis not present

## 2017-01-05 DIAGNOSIS — F1020 Alcohol dependence, uncomplicated: Secondary | ICD-10-CM | POA: Insufficient documentation

## 2017-01-05 DIAGNOSIS — R200 Anesthesia of skin: Secondary | ICD-10-CM | POA: Insufficient documentation

## 2017-01-05 DIAGNOSIS — Z8546 Personal history of malignant neoplasm of prostate: Secondary | ICD-10-CM | POA: Insufficient documentation

## 2017-01-05 DIAGNOSIS — I5022 Chronic systolic (congestive) heart failure: Secondary | ICD-10-CM | POA: Insufficient documentation

## 2017-01-05 DIAGNOSIS — I110 Hypertensive heart disease with heart failure: Secondary | ICD-10-CM | POA: Diagnosis not present

## 2017-01-05 DIAGNOSIS — G4733 Obstructive sleep apnea (adult) (pediatric): Secondary | ICD-10-CM | POA: Diagnosis not present

## 2017-01-05 DIAGNOSIS — Z87891 Personal history of nicotine dependence: Secondary | ICD-10-CM | POA: Diagnosis not present

## 2017-01-05 DIAGNOSIS — J45909 Unspecified asthma, uncomplicated: Secondary | ICD-10-CM | POA: Insufficient documentation

## 2017-01-05 NOTE — Patient Instructions (Signed)
Continue weighing daily and call for an overnight weight gain of > 2 pounds or a weekly weight gain of >5 pounds. 

## 2017-01-05 NOTE — Progress Notes (Signed)
Patient ID: PRAKASH KIMBERLING, male    DOB: 1935-12-14, 82 y.o.   MRN: [id number]  HPI  Mr Lortie is a 82 y/o male with a history of PVD, prostate cancer, HTN, hyperlipidemia, degenerative joint disease, CAD, COPD, bipolar, asthma, previous alcohol use and prior tobacco use.   Last echo was done 09/11/16 which showed an EF of 40-45% with mild/moderate MR and mild/moderate TR. Last cardiac cath was done in 2005 and he had an angioplasty with a drug-eluting stent placed in the right coronary artery.   Presented to the ED on 12/20/16 with dizziness but decided to leave prior to being seen. Was admitted on 12/01/16 with hyponatremia & GI bleed. Was monitored and released after 2 days.    He presents today for his follow-up visit with fatigue and shortness of breath with exertion. Denies any swelling in his legs/abdomen. Is weighing himself daily and says that his weight has been stable.  Does have [place] coming out 2-3 times/week. Not adding salt to his food. Does have difficulty sleeping at night. Wears CPAP nightly. Biggest concern is of some numbness on his scalp that he's being followed by neurology.   Past Medical History:  Diagnosis Date  . Alcoholism ([place])   . Asthma   . Bipolar affective disorder ([place])   . CHF (congestive heart failure) ([place])   . COPD (chronic obstructive pulmonary disease) ([facility])    COPD  . Coronary artery disease February 2005   PCI and Taxus drug-eluting stent placement to the distal RCA (3.5 x 12 mm)  . Degenerative joint disease   . Essential hypertension   . Hyperlipidemia   . Hypertension   . Prostate CA [facility])    prostate ca dx 35 ys ago;  . Prostate CA [facility]    prostate ca dx 20 yrs ago  . PVD (peripheral vascular disease) ([place])   . Tobacco abuse    Past Surgical History:  Procedure Laterality Date  . CARDIAC CATHETERIZATION    . CORONARY ANGIOPLASTY WITH STENT PLACEMENT    . TOTAL HIP ARTHROPLASTY     right   Family History  Problem  Relation Age of Onset  . Hypertension Mother   . Hyperlipidemia Mother   . Heart attack Mother   . Hypertension Father   . Hyperlipidemia Father   . Heart attack Father    Social History  Substance Use Topics  . Smoking status: Former Smoker    Packs/day: 1.00    Years: 50.00    Types: Cigarettes  . Smokeless tobacco: Never Used  . Alcohol use Yes     Comment: social    Allergies  Allergen Reactions  . Amlodipine     Constipation   . Benazepril-Hydrochlorothiazide     Constipation   . Chlorthalidone     Hyponatremia  . Other Other (See Comments)    "ANY BLOOD PRESSURE MEDICATIONS THAT I'VE TRIED" - PT. DOES NOT REMEMBER WHICH ONES   Prior to Admission medications   Medication Sig Start Date End Date Taking? Authorizing Provider  albuterol (PROAIR HFA) 108 (90 Base) MCG/ACT inhaler Inhale 2 puffs into the lungs every 6 (six) hours as needed for wheezing or shortness of breath. Reported on 04/21/2016 01/21/16 01/20/17 Yes Historical Provider, MD  aspirin EC 81 MG tablet Take 81 mg by mouth every morning.   Yes Historical Provider, MD  atorvastatin (LIPITOR) 40 MG tablet Take 40 mg by mouth daily.   Yes Historical Provider, MD  famotidine (  PEPCID) 20 MG tablet Take 1 tablet (20 mg total) by mouth daily. 09/07/16  Yes Epifanio Lesches, MD  fluticasone-salmeterol (ADVAIR HFA) 151-76 MCG/ACT inhaler Inhale 2 puffs into the lungs 2 (two) times daily.   Yes Historical Provider, MD  furosemide (LASIX) 40 MG tablet Take 0.5 tablets (20 mg total) by mouth 2 (two) times daily. 09/13/16  Yes Dustin Flock, MD  hydrALAZINE (APRESOLINE) 25 MG tablet Take 2 tablets (50 mg total) by mouth every 8 (eight) hours. 10/30/16  Yes Alisa Graff, FNP  isosorbide mononitrate (IMDUR) 30 MG 24 hr tablet Take 1 tablet (30 mg total) by mouth daily. 11/23/16 02/21/17 Yes [name], MD  ketoconazole (NIZORAL) 2 % shampoo Apply 1 application topically 3 (three) times a week.  11/21/16  Yes Historical  Provider, MD  levothyroxine (SYNTHROID, LEVOTHROID) 50 MCG tablet Take 50 mcg by mouth daily before breakfast.   Yes Historical Provider, MD  magnesium oxide (MAG-OX) 400 MG tablet Take 400 mg by mouth 2 (two) times daily.    Yes Historical Provider, MD  metoprolol (LOPRESSOR) 50 MG tablet Take 1 tablet (50 mg total) by mouth 2 (two) times daily. 09/06/16  Yes Epifanio Lesches, MD  nitroGLYCERIN (NITROSTAT) 0.4 MG SL tablet Take 0.4 mg by mouth every 5 (five) minutes x 3 doses as needed for chest pain. As needed for chest pain 12/06/15  Yes Historical Provider, MD  Tiotropium Bromide Monohydrate (SPIRIVA RESPIMAT) 1.25 MCG/ACT AERS Inhale 1 puff into the lungs daily.   Yes Historical Provider, MD  vitamin B-6 (PYRIDOXINE) 25 MG tablet Take 25 mg by mouth daily.   Yes Historical Provider, MD     Review of Systems  Constitutional: Positive for fatigue (mild). Negative for appetite change.  HENT: Positive for rhinorrhea. Negative for congestion and sore throat.   Eyes: Negative.   Respiratory: Positive for shortness of breath. Negative for cough and chest tightness.   Cardiovascular: Negative for chest pain, palpitations and leg swelling.  Gastrointestinal: Negative for abdominal distention and abdominal pain.  Endocrine: Negative.   Genitourinary: Negative.   Musculoskeletal: Negative for arthralgias and back pain.  Allergic/Immunologic: Negative.   Neurological: Positive for light-headedness and numbness (over scalp). Negative for headaches.  Hematological: Negative for adenopathy. Does not bruise/bleed easily.  Psychiatric/Behavioral: Positive for sleep disturbance (CPAP nightly. sleeping from 7pm- 56midnight). Negative for dysphoric mood and suicidal ideas. The patient is not nervous/anxious.    Vitals:   01/05/17 1138  BP: (!) 141/54  Pulse: 62  Resp: 20  SpO2: 99%  Weight: 293 lb (132.9 kg)  Height: 5\' 7"  (1.702 m)   Wt Readings from Last 3 Encounters:  01/05/17 293 lb (132.9  kg)  12/20/16 290 lb (131.5 kg)  12/01/16 295 lb (133.8 kg)   Lab Results  Component Value Date   CREATININE 1.94 (H) 12/20/2016   CREATININE 2.01 (H) 12/03/2016   CREATININE 1.83 (H) 12/02/2016   Physical Exam  Constitutional: He is oriented to person, place, and time. He appears well-developed and well-nourished.  HENT:  Head: Normocephalic and atraumatic.  Eyes: Conjunctivae are normal. Pupils are equal, round, and reactive to light.  Neck: Normal range of motion. Neck supple. No JVD present.  Cardiovascular: Regular rhythm.  Bradycardia present.   Pulmonary/Chest: Effort normal. He has no wheezes. He has no rales.  Abdominal: Soft. He exhibits no distension. There is no tenderness.  Musculoskeletal: He exhibits no edema or tenderness.  Neurological: He is alert and oriented to person, place, and  time.  Skin: Skin is warm and dry.  Psychiatric: He has a normal mood and affect. His behavior is normal. Thought content normal.  Nursing note and vitals reviewed.     Assessment & Plan:  1: Chronic heart failure with reduced ejection fraction- - NHYA class II - euvolemic - continue weighing daily and call for an overnight weight gain of >2 pounds or a weekly weight gain of >5 pounds. Weight down 3 pounds from previous visit in November 2017. - already received flu vaccine for this season - saw cardiologist Fletcher Anon) on 11/23/16 and started isosorbide at that time - still isn't interested in switching losartan to entresto due to reactions to previous BP medications  2: HTN- - BP looks good today - saw PCP [name]) 2 weeks ago  3: Obstructive sleep apnea- - Wearing CPAP nightly - having difficulty staying asleep. He says that he falls asleep around 7pm and sleeps good until midnight and then is up the rest of the night. Encouraged him to try to stay awake as long as possible so that he can sleep later in the night.  4: Alcohol use- - says that he hasn't had anything to drink in  the last couple of weeks. When he did drink, he says that he drank less than he used to - encouraged continued cessation  5: Meningioma- - recently started having some numbness in his scalp along with a sensation of something crawling on his scalp. Using a shampoo prescribed by his PCP and has recently seen his neurologist Lacinda Axon) - recently had a brain MRI done and hasn't heard results yet  Return here in 3 months or sooner for any questions/problems before then.

## 2017-01-12 ENCOUNTER — Ambulatory Visit: Payer: Medicare Other | Admitting: Family

## 2017-02-20 NOTE — Telephone Encounter (Signed)
Called Mr [name]'s daughter. Plan to discharge Mr Muench - last attended 08/23/16. He is having therapy from home nursing at this time. He plans to return to [facility] under the [employer].    By Karie Fetch

## 2017-03-10 ENCOUNTER — Emergency Department
Admission: EM | Admit: 2017-03-10 | Discharge: 2017-03-10 | Disposition: A | Discharge status: Home / Self Care | Payer: Medicare Other | Attending: Emergency Medicine | Admitting: Emergency Medicine

## 2017-03-10 ENCOUNTER — Encounter: Payer: Self-pay | Admitting: Emergency Medicine

## 2017-03-10 DIAGNOSIS — A09 Infectious gastroenteritis and colitis, unspecified: Secondary | ICD-10-CM | POA: Insufficient documentation

## 2017-03-10 DIAGNOSIS — J449 Chronic obstructive pulmonary disease, unspecified: Secondary | ICD-10-CM | POA: Diagnosis not present

## 2017-03-10 DIAGNOSIS — I2510 Atherosclerotic heart disease of native coronary artery without angina pectoris: Secondary | ICD-10-CM | POA: Insufficient documentation

## 2017-03-10 DIAGNOSIS — Z7982 Long term (current) use of aspirin: Secondary | ICD-10-CM | POA: Insufficient documentation

## 2017-03-10 DIAGNOSIS — Z87891 Personal history of nicotine dependence: Secondary | ICD-10-CM | POA: Diagnosis not present

## 2017-03-10 DIAGNOSIS — J45909 Unspecified asthma, uncomplicated: Secondary | ICD-10-CM | POA: Diagnosis not present

## 2017-03-10 DIAGNOSIS — I110 Hypertensive heart disease with heart failure: Secondary | ICD-10-CM | POA: Diagnosis not present

## 2017-03-10 DIAGNOSIS — Z8546 Personal history of malignant neoplasm of prostate: Secondary | ICD-10-CM | POA: Insufficient documentation

## 2017-03-10 DIAGNOSIS — E871 Hypo-osmolality and hyponatremia: Secondary | ICD-10-CM | POA: Insufficient documentation

## 2017-03-10 DIAGNOSIS — I5022 Chronic systolic (congestive) heart failure: Secondary | ICD-10-CM | POA: Insufficient documentation

## 2017-03-10 DIAGNOSIS — Z79899 Other long term (current) drug therapy: Secondary | ICD-10-CM | POA: Diagnosis not present

## 2017-03-10 DIAGNOSIS — R197 Diarrhea, unspecified: Secondary | ICD-10-CM | POA: Diagnosis present

## 2017-03-10 LAB — CBC
HCT: 30 % — ABNORMAL LOW (ref 40.0–52.0)
Hemoglobin: 10.2 g/dL — ABNORMAL LOW (ref 13.0–18.0)
MCH: 29.7 pg (ref 26.0–34.0)
MCHC: 34.1 g/dL (ref 32.0–36.0)
MCV: 87.1 fL (ref 80.0–100.0)
Platelets: 236 10*3/uL (ref 150–440)
RBC: 3.44 MIL/uL — AB (ref 4.40–5.90)
RDW: 15.3 % — ABNORMAL HIGH (ref 11.5–14.5)
WBC: 6.4 10*3/uL (ref 3.8–10.6)

## 2017-03-10 LAB — BASIC METABOLIC PANEL
Anion gap: 7 (ref 5–15)
BUN: 25 mg/dL — ABNORMAL HIGH (ref 6–20)
CHLORIDE: 101 mmol/L (ref 101–111)
CO2: 24 mmol/L (ref 22–32)
Calcium: 8 mg/dL — ABNORMAL LOW (ref 8.9–10.3)
Creatinine, Ser: 1.55 mg/dL — ABNORMAL HIGH (ref 0.61–1.24)
GFR calc non Af Amer: 41 mL/min — ABNORMAL LOW (ref >60)
GFR, EST AFRICAN AMERICAN: 47 mL/min — AB (ref >60)
Glucose, Bld: 92 mg/dL (ref 65–99)
POTASSIUM: 3.8 mmol/L (ref 3.5–5.1)
SODIUM: 132 mmol/L — AB (ref 135–145)

## 2017-03-10 LAB — URINALYSIS, COMPLETE (UACMP) WITH MICROSCOPIC
Bilirubin Urine: NEGATIVE
Glucose, UA: NEGATIVE mg/dL
Ketones, ur: NEGATIVE mg/dL
Leukocytes, UA: NEGATIVE
Nitrite: NEGATIVE
PH: 5 (ref 5.0–8.0)
Protein, ur: 100 mg/dL — AB
SPECIFIC GRAVITY, URINE: 1.004 — AB (ref 1.005–1.030)

## 2017-03-10 LAB — COMPREHENSIVE METABOLIC PANEL
ALK PHOS: 97 U/L (ref 38–126)
ALT: 25 U/L (ref 17–63)
ANION GAP: 7 (ref 5–15)
AST: 37 U/L (ref 15–41)
Albumin: 3.3 g/dL — ABNORMAL LOW (ref 3.5–5.0)
BUN: 27 mg/dL — ABNORMAL HIGH (ref 6–20)
CALCIUM: 8 mg/dL — AB (ref 8.9–10.3)
CO2: 22 mmol/L (ref 22–32)
Chloride: 98 mmol/L — ABNORMAL LOW (ref 101–111)
Creatinine, Ser: 1.55 mg/dL — ABNORMAL HIGH (ref 0.61–1.24)
GFR calc non Af Amer: 41 mL/min — ABNORMAL LOW (ref >60)
GFR, EST AFRICAN AMERICAN: 47 mL/min — AB (ref >60)
Glucose, Bld: 103 mg/dL — ABNORMAL HIGH (ref 65–99)
Potassium: 3.7 mmol/L (ref 3.5–5.1)
SODIUM: 127 mmol/L — AB (ref 135–145)
TOTAL PROTEIN: 6.7 g/dL (ref 6.5–8.1)
Total Bilirubin: 0.5 mg/dL (ref 0.3–1.2)

## 2017-03-10 LAB — LIPASE, BLOOD: Lipase: 36 U/L (ref 11–51)

## 2017-03-10 MED ORDER — SODIUM CHLORIDE 0.9 % IV BOLUS (SEPSIS)
1000.0000 mL | Freq: Once | INTRAVENOUS | Status: AC
  Administered 2017-03-10: 1000 mL via INTRAVENOUS

## 2017-03-10 MED ORDER — ONDANSETRON HCL 4 MG PO TABS: 4.0000 mg | ORAL_TABLET | Freq: Three times a day (TID) | ORAL | 0 refills | Status: DC | PRN

## 2017-03-10 MED ORDER — ONDANSETRON HCL 4 MG/2ML IJ SOLN
4.0000 mg | Freq: Once | INTRAMUSCULAR | Status: AC
  Administered 2017-03-10: 4 mg via INTRAVENOUS
  Filled 2017-03-10: qty 2

## 2017-03-10 NOTE — ED Triage Notes (Addendum)
Patient states that he has had 5-6-7-OR-8 Episodes of diarrhea since 7:00pm yesterday.   Here for evaluation of same and c/o "pain all over 8/10"  +Nausea Denies Vomiting.

## 2017-03-10 NOTE — ED Provider Notes (Signed)
[facility] Emergency Department Provider Note  ____________________________________________  Time seen: Approximately 1:21 PM  I have reviewed the triage vital signs and the nursing notes.   HISTORY  Chief Complaint Diarrhea   HPI [name] is a 82 y.o. male with history of CHF, COPD, CAD, hypertension, hyperlipidemia who presents for evaluation of diarrhea.Patient has had several episodes of watery diarrhea since yesterday 7 PM. Has had significant nausea but denies vomiting. He denies body aches, fever, chills, abdominal pain, chest pain or shortness of breath, or dizziness. Patient denies melena, hematemesis, hematochezia, coffee-ground emesis. He denies any known sick contact exposure. Patient has not been on any antibiotics recently. No prior history of C. difficile.  Past Medical History:  Diagnosis Date  . Alcoholism ([place])   . Asthma   . Bipolar affective disorder ([place])   . CHF (congestive heart failure) ([place])   . COPD (chronic obstructive pulmonary disease) (H[facility])    COPD  . Coronary artery disease February 2005   PCI and Taxus drug-eluting stent placement to the distal RCA (3.5 x 12 mm)  . Degenerative joint disease   . Essential hypertension   . Hyperlipidemia   . Hypertension   . Prostate CA [facility])    prostate ca dx 64 ys ago;  . Prostate CA [facility])    prostate ca dx 20 yrs ago  . PVD (peripheral vascular disease) ([place])   . Tobacco abuse     Patient Active Problem List   Diagnosis Date Noted  . Meningioma ([place]) 01/05/2017  . GI bleed 12/01/2016  . Alcohol abuse 10/30/2016  . Chronic systolic heart failure ([place]) 10/04/2016  . COPD (chronic obstructive pulmonary disease) with chronic bronchitis ([place]) 10/04/2016  . Obstructive sleep apnea 10/04/2016  . Hyponatremia 08/31/2016  . UTI (lower urinary tract infection) 08/31/2016  . Exertional dyspnea 02/11/2016  . Hyperlipidemia 12/03/2015  . Essential hypertension   . Coronary  artery disease 01/19/2004    Past Surgical History:  Procedure Laterality Date  . CARDIAC CATHETERIZATION    . CORONARY ANGIOPLASTY WITH STENT PLACEMENT    . TOTAL HIP ARTHROPLASTY     right    Prior to Admission medications   Medication Sig Start Date End Date Taking? Authorizing Provider  albuterol (PROAIR HFA) 108 (90 Base) MCG/ACT inhaler Inhale 2 puffs into the lungs every 6 (six) hours as needed for wheezing or shortness of breath. Reported on 04/21/2016 01/21/16 01/20/17  Historical Provider, MD  aspirin EC 81 MG tablet Take 81 mg by mouth every morning.    Historical Provider, MD  atorvastatin (LIPITOR) 40 MG tablet Take 40 mg by mouth daily.    Historical Provider, MD  famotidine (PEPCID) 20 MG tablet Take 1 tablet (20 mg total) by mouth daily. 09/07/16   Epifanio Lesches, MD  fluticasone-salmeterol (ADVAIR HFA) 229-79 MCG/ACT inhaler Inhale 2 puffs into the lungs 2 (two) times daily.    Historical Provider, MD  furosemide (LASIX) 40 MG tablet Take 0.5 tablets (20 mg total) by mouth 2 (two) times daily. 09/13/16   Dustin Flock, MD  hydrALAZINE (APRESOLINE) 25 MG tablet Take 2 tablets (50 mg total) by mouth every 8 (eight) hours. 10/30/16   Alisa Graff, FNP  isosorbide mononitrate (IMDUR) 30 MG 24 hr tablet Take 1 tablet (30 mg total) by mouth daily. 11/23/16 02/21/17  [name], MD  ketoconazole (NIZORAL) 2 % shampoo Apply 1 application topically 3 (three) times a week.  11/21/16   Historical Provider,  MD  levothyroxine (SYNTHROID, LEVOTHROID) 50 MCG tablet Take 50 mcg by mouth daily before breakfast.    Historical Provider, MD  magnesium oxide (MAG-OX) 400 MG tablet Take 400 mg by mouth 2 (two) times daily.     Historical Provider, MD  metoprolol (LOPRESSOR) 50 MG tablet Take 1 tablet (50 mg total) by mouth 2 (two) times daily. 09/06/16   Epifanio Lesches, MD  nitroGLYCERIN (NITROSTAT) 0.4 MG SL tablet Take 0.4 mg by mouth every 5 (five) minutes x 3 doses as needed for chest  pain. As needed for chest pain 12/06/15   Historical Provider, MD  ondansetron (ZOFRAN) 4 MG tablet Take 1 tablet (4 mg total) by mouth every 8 (eight) hours as needed for nausea or vomiting. 03/10/17   Rudene Re, MD  Tiotropium Bromide Monohydrate (SPIRIVA RESPIMAT) 1.25 MCG/ACT AERS Inhale 1 puff into the lungs daily.    Historical Provider, MD  vitamin B-6 (PYRIDOXINE) 25 MG tablet Take 25 mg by mouth daily.    Historical Provider, MD    Allergies Amlodipine; Benazepril-hydrochlorothiazide; Chlorthalidone; and Other  Family History  Problem Relation Age of Onset  . Hypertension Mother   . Hyperlipidemia Mother   . Heart attack Mother   . Hypertension Father   . Hyperlipidemia Father   . Heart attack Father     Social History Social History  Substance Use Topics  . Smoking status: Former Smoker    Packs/day: 1.00    Years: 50.00    Types: Cigarettes  . Smokeless tobacco: Never Used  . Alcohol use Yes     Comment: social     Review of Systems  Constitutional: Negative for fever. Eyes: Negative for visual changes. ENT: Negative for sore throat. Neck: No neck pain  Cardiovascular: Negative for chest pain. Respiratory: Negative for shortness of breath. Gastrointestinal: Negative for abdominal pain, vomiting. + diarrhea. Genitourinary: Negative for dysuria. Musculoskeletal: Negative for back pain. Skin: Negative for rash. Neurological: Negative for headaches, weakness or numbness. Psych: No SI or HI  ____________________________________________   PHYSICAL EXAM:  VITAL SIGNS: ED Triage Vitals  Enc Vitals Group     BP 03/10/17 1001 (!) 149/60     Pulse Rate 03/10/17 1001 65     Resp 03/10/17 1001 18     Temp 03/10/17 1001 98.2 F (36.8 C)     Temp Source 03/10/17 1001 Oral     SpO2 03/10/17 1001 97 %     Weight 03/10/17 0958 270 lb (122.5 kg)     Height 03/10/17 0958 5\' 7"  (1.702 m)     Head Circumference --      Peak Flow --      Pain Score 03/10/17  0959 8     Pain Loc --      Pain Edu? --      Excl. in [place]? --     Constitutional: Alert and oriented. Well appearing and in no apparent distress. HEENT:      Head: Normocephalic and atraumatic.         Eyes: Conjunctivae are normal. Sclera is non-icteric. EOMI. PERRL      Mouth/Throat: Mucous membranes are moist.       Neck: Supple with no signs of meningismus. Cardiovascular: Regular rate and rhythm. No murmurs, gallops, or rubs. 2+ symmetrical distal pulses are present in all extremities. No JVD. Respiratory: Normal respiratory effort. Lungs are clear to auscultation bilaterally. No wheezes, crackles, or rhonchi.  Gastrointestinal: Soft, non tender, and non distended with positive  bowel sounds. No rebound or guarding. Genitourinary: No CVA tenderness. Musculoskeletal: Nontender with normal range of motion in all extremities. No edema, cyanosis, or erythema of extremities. Neurologic: Normal speech and language. Face is symmetric. Moving all extremities. No gross focal neurologic deficits are appreciated. Skin: Skin is warm, dry and intact. No rash noted. Psychiatric: Mood and affect are normal. Speech and behavior are normal.  ____________________________________________   LABS (all labs ordered are listed, but only abnormal results are displayed)  Labs Reviewed  COMPREHENSIVE METABOLIC PANEL - Abnormal; Notable for the following:       Result Value   Sodium 127 (*)    Chloride 98 (*)    Glucose, Bld 103 (*)    BUN 27 (*)    Creatinine, Ser 1.55 (*)    Calcium 8.0 (*)    Albumin 3.3 (*)    GFR calc non Af Amer 41 (*)    GFR calc Af Amer 47 (*)    All other components within normal limits  CBC - Abnormal; Notable for the following:    RBC 3.44 (*)    Hemoglobin 10.2 (*)    HCT 30.0 (*)    RDW 15.3 (*)    All other components within normal limits  URINALYSIS, COMPLETE (UACMP) WITH MICROSCOPIC - Abnormal; Notable for the following:    Color, Urine STRAW (*)    APPearance  CLEAR (*)    Specific Gravity, Urine 1.004 (*)    Hgb urine dipstick SMALL (*)    Protein, ur 100 (*)    Bacteria, UA RARE (*)    Squamous Epithelial / LPF 0-5 (*)    All other components within normal limits  BASIC METABOLIC PANEL - Abnormal; Notable for the following:    Sodium 132 (*)    BUN 25 (*)    Creatinine, Ser 1.55 (*)    Calcium 8.0 (*)    GFR calc non Af Amer 41 (*)    GFR calc Af Amer 47 (*)    All other components within normal limits  C DIFFICILE QUICK SCREEN W PCR REFLEX  LIPASE, BLOOD   ____________________________________________  EKG  none ____________________________________________  RADIOLOGY  none  ____________________________________________   PROCEDURES  Procedure(s) performed: None Procedures Critical Care performed:  None ____________________________________________   INITIAL IMPRESSION / ASSESSMENT AND PLAN / ED COURSE  82 y.o. male with history of CHF, COPD, CAD, hypertension, hyperlipidemia who presents for evaluation of several episodes of watery diarrhea since yesterday 7 PM. No prior history of C. difficile or recent antibiotic use. Patient is well-appearing, normal vital signs, abdomen is soft with no tenderness throughout.Blood work showing hyponatremia with sodium of 127, creatinine is at baseline, normal potassium, normal white count, stable hemoglobin. We'll give IV fluids and recheck sodium. We'll check for C. difficile.  Clinical Course as of Mar 10 1528  Sat Mar 10, 2017  1513 Sodium improved from 127 01/18/1931 with IV fluids. Kidney function remained stable. The patient has not had a bowel movement in the emergency department for 5.5 hours. C. difficile has been canceled. Patient feels markedly improved. Tolerating by mouth. No nausea or vomiting. We'll discharge home at this time.  [CV]    Clinical Course User Index [CV] Rudene Re, MD    Pertinent labs & imaging results that were available during my care of the  patient were reviewed by me and considered in my medical decision making (see chart for details).    ____________________________________________   FINAL CLINICAL IMPRESSION(S) /  ED DIAGNOSES  Final diagnoses:  Diarrhea of infectious origin  Hyponatremia      NEW MEDICATIONS STARTED DURING THIS VISIT:  New Prescriptions   ONDANSETRON (ZOFRAN) 4 MG TABLET    Take 1 tablet (4 mg total) by mouth every 8 (eight) hours as needed for nausea or vomiting.     Note:  This document was prepared using Dragon voice recognition software and may include unintentional dictation errors.    Rudene Re, MD 03/10/17 [phone]

## 2017-04-06 ENCOUNTER — Ambulatory Visit: Payer: Medicare Other | Admitting: Family

## 2017-05-11 ENCOUNTER — Other Ambulatory Visit: Payer: Self-pay | Admitting: Orthopedic Surgery

## 2017-05-11 ENCOUNTER — Ambulatory Visit: Payer: Medicare Other

## 2017-05-11 DIAGNOSIS — M1612 Unilateral primary osteoarthritis, left hip: Secondary | ICD-10-CM

## 2017-05-11 DIAGNOSIS — Z8781 Personal history of (healed) traumatic fracture: Secondary | ICD-10-CM

## 2017-05-11 DIAGNOSIS — M25552 Pain in left hip: Secondary | ICD-10-CM

## 2017-05-15 ENCOUNTER — Other Ambulatory Visit: Payer: Self-pay | Admitting: Orthopedic Surgery

## 2017-05-15 ENCOUNTER — Ambulatory Visit
Admission: RE | Admit: 2017-05-15 | Discharge: 2017-05-15 | Disposition: A | Discharge status: Home / Self Care | Payer: Medicare Other | Source: Ambulatory Visit | Attending: Orthopedic Surgery | Admitting: Orthopedic Surgery

## 2017-05-15 DIAGNOSIS — K5730 Diverticulosis of large intestine without perforation or abscess without bleeding: Secondary | ICD-10-CM | POA: Diagnosis not present

## 2017-05-15 DIAGNOSIS — M25552 Pain in left hip: Secondary | ICD-10-CM | POA: Diagnosis present

## 2017-05-15 DIAGNOSIS — M1612 Unilateral primary osteoarthritis, left hip: Secondary | ICD-10-CM | POA: Insufficient documentation

## 2017-05-15 DIAGNOSIS — Z8781 Personal history of (healed) traumatic fracture: Secondary | ICD-10-CM | POA: Diagnosis not present

## 2017-05-15 DIAGNOSIS — I708 Atherosclerosis of other arteries: Secondary | ICD-10-CM | POA: Diagnosis not present

## 2017-05-17 ENCOUNTER — Ambulatory Visit: Payer: Medicare Other

## 2017-05-27 ENCOUNTER — Other Ambulatory Visit: Payer: Self-pay

## 2017-05-27 ENCOUNTER — Encounter: Payer: Self-pay | Admitting: *Deleted

## 2017-05-27 ENCOUNTER — Inpatient Hospital Stay
Admission: EM | Admit: 2017-05-27 | Discharge: 2017-05-31 | DRG: 552 | Disposition: A | Discharge status: Skilled Nursing Facility | Payer: Medicare Other | Attending: Internal Medicine | Admitting: Internal Medicine

## 2017-05-27 ENCOUNTER — Emergency Department: Payer: Medicare Other

## 2017-05-27 DIAGNOSIS — E785 Hyperlipidemia, unspecified: Secondary | ICD-10-CM | POA: Diagnosis present

## 2017-05-27 DIAGNOSIS — M4726 Other spondylosis with radiculopathy, lumbar region: Secondary | ICD-10-CM | POA: Diagnosis not present

## 2017-05-27 DIAGNOSIS — N179 Acute kidney failure, unspecified: Secondary | ICD-10-CM | POA: Diagnosis present

## 2017-05-27 DIAGNOSIS — I130 Hypertensive heart and chronic kidney disease with heart failure and stage 1 through stage 4 chronic kidney disease, or unspecified chronic kidney disease: Secondary | ICD-10-CM | POA: Diagnosis present

## 2017-05-27 DIAGNOSIS — K5900 Constipation, unspecified: Secondary | ICD-10-CM | POA: Diagnosis present

## 2017-05-27 DIAGNOSIS — I509 Heart failure, unspecified: Secondary | ICD-10-CM | POA: Diagnosis present

## 2017-05-27 DIAGNOSIS — R55 Syncope and collapse: Secondary | ICD-10-CM | POA: Diagnosis present

## 2017-05-27 DIAGNOSIS — J449 Chronic obstructive pulmonary disease, unspecified: Secondary | ICD-10-CM | POA: Diagnosis present

## 2017-05-27 DIAGNOSIS — E039 Hypothyroidism, unspecified: Secondary | ICD-10-CM | POA: Diagnosis present

## 2017-05-27 DIAGNOSIS — M25552 Pain in left hip: Secondary | ICD-10-CM

## 2017-05-27 DIAGNOSIS — I739 Peripheral vascular disease, unspecified: Secondary | ICD-10-CM | POA: Diagnosis present

## 2017-05-27 DIAGNOSIS — M25562 Pain in left knee: Secondary | ICD-10-CM | POA: Diagnosis present

## 2017-05-27 DIAGNOSIS — E875 Hyperkalemia: Secondary | ICD-10-CM | POA: Diagnosis present

## 2017-05-27 DIAGNOSIS — I2510 Atherosclerotic heart disease of native coronary artery without angina pectoris: Secondary | ICD-10-CM | POA: Diagnosis present

## 2017-05-27 DIAGNOSIS — Z8546 Personal history of malignant neoplasm of prostate: Secondary | ICD-10-CM

## 2017-05-27 DIAGNOSIS — Z955 Presence of coronary angioplasty implant and graft: Secondary | ICD-10-CM

## 2017-05-27 DIAGNOSIS — Z79899 Other long term (current) drug therapy: Secondary | ICD-10-CM

## 2017-05-27 DIAGNOSIS — N184 Chronic kidney disease, stage 4 (severe): Secondary | ICD-10-CM | POA: Diagnosis present

## 2017-05-27 DIAGNOSIS — Z87891 Personal history of nicotine dependence: Secondary | ICD-10-CM

## 2017-05-27 DIAGNOSIS — Z96649 Presence of unspecified artificial hip joint: Secondary | ICD-10-CM | POA: Diagnosis present

## 2017-05-27 DIAGNOSIS — Z7982 Long term (current) use of aspirin: Secondary | ICD-10-CM

## 2017-05-27 DIAGNOSIS — G629 Polyneuropathy, unspecified: Secondary | ICD-10-CM | POA: Diagnosis present

## 2017-05-27 DIAGNOSIS — Z888 Allergy status to other drugs, medicaments and biological substances status: Secondary | ICD-10-CM

## 2017-05-27 DIAGNOSIS — R52 Pain, unspecified: Secondary | ICD-10-CM

## 2017-05-27 DIAGNOSIS — M79605 Pain in left leg: Secondary | ICD-10-CM

## 2017-05-27 LAB — BASIC METABOLIC PANEL
ANION GAP: 13 (ref 5–15)
BUN: 37 mg/dL — AB (ref 6–20)
CALCIUM: 8.8 mg/dL — AB (ref 8.9–10.3)
CO2: 20 mmol/L — AB (ref 22–32)
CREATININE: 1.71 mg/dL — AB (ref 0.61–1.24)
Chloride: 103 mmol/L (ref 101–111)
GFR calc Af Amer: 42 mL/min — ABNORMAL LOW (ref >60)
GFR, EST NON AFRICAN AMERICAN: 36 mL/min — AB (ref >60)
GLUCOSE: 85 mg/dL (ref 65–99)
Potassium: 4.8 mmol/L (ref 3.5–5.1)
Sodium: 136 mmol/L (ref 135–145)

## 2017-05-27 LAB — TROPONIN I: Troponin I: <0.03 ng/mL (ref <0.03)

## 2017-05-27 LAB — CBC
HCT: 33.8 % — ABNORMAL LOW (ref 40.0–52.0)
Hemoglobin: 11.5 g/dL — ABNORMAL LOW (ref 13.0–18.0)
MCH: 28.7 pg (ref 26.0–34.0)
MCHC: 33.9 g/dL (ref 32.0–36.0)
MCV: 84.8 fL (ref 80.0–100.0)
PLATELETS: 307 10*3/uL (ref 150–440)
RBC: 3.99 MIL/uL — ABNORMAL LOW (ref 4.40–5.90)
RDW: 15 % — AB (ref 11.5–14.5)
WBC: 8 10*3/uL (ref 3.8–10.6)

## 2017-05-27 LAB — GLUCOSE, CAPILLARY: Glucose-Capillary: 86 mg/dL (ref 65–99)

## 2017-05-27 LAB — BRAIN NATRIURETIC PEPTIDE: B NATRIURETIC PEPTIDE 5: 42 pg/mL (ref 0.0–100.0)

## 2017-05-27 MED ORDER — SODIUM CHLORIDE 0.9 % IV BOLUS (SEPSIS)
500.0000 mL | Freq: Once | INTRAVENOUS | Status: AC
  Administered 2017-05-27: 500 mL via INTRAVENOUS

## 2017-05-27 MED ORDER — ATORVASTATIN CALCIUM 20 MG PO TABS
40.0000 mg | ORAL_TABLET | Freq: Every day | ORAL | Status: DC
  Administered 2017-05-27 – 2017-05-31 (×5): 40 mg via ORAL
  Filled 2017-05-27 (×5): qty 2

## 2017-05-27 MED ORDER — ASPIRIN EC 81 MG PO TBEC
81.0000 mg | DELAYED_RELEASE_TABLET | ORAL | Status: DC
  Administered 2017-05-28 – 2017-05-31 (×4): 81 mg via ORAL
  Filled 2017-05-27 (×4): qty 1

## 2017-05-27 MED ORDER — ONDANSETRON HCL 4 MG/2ML IJ SOLN: 4.0000 mg | Freq: Four times a day (QID) | INTRAMUSCULAR | Status: DC | PRN

## 2017-05-27 MED ORDER — ISOSORBIDE MONONITRATE ER 30 MG PO TB24
30.0000 mg | ORAL_TABLET | Freq: Every day | ORAL | Status: DC
  Administered 2017-05-27 – 2017-05-29 (×3): 30 mg via ORAL
  Filled 2017-05-27 (×3): qty 1

## 2017-05-27 MED ORDER — GABAPENTIN 300 MG PO CAPS
300.0000 mg | ORAL_CAPSULE | Freq: Three times a day (TID) | ORAL | Status: DC
  Administered 2017-05-27 – 2017-05-31 (×11): 300 mg via ORAL
  Filled 2017-05-27 (×11): qty 1

## 2017-05-27 MED ORDER — ONDANSETRON HCL 4 MG/2ML IJ SOLN
4.0000 mg | Freq: Once | INTRAMUSCULAR | Status: AC
Start: 2017-05-27 — End: 2017-05-27
  Administered 2017-05-27: 4 mg via INTRAVENOUS
  Filled 2017-05-27: qty 2

## 2017-05-27 MED ORDER — VITAMIN B-6 25 MG PO TABS
25.0000 mg | ORAL_TABLET | Freq: Every day | ORAL | Status: DC
  Administered 2017-05-27 – 2017-05-31 (×5): 25 mg via ORAL
  Filled 2017-05-27 (×5): qty 1

## 2017-05-27 MED ORDER — HEPARIN SODIUM (PORCINE) 5000 UNIT/ML IJ SOLN
5000.0000 [IU] | Freq: Three times a day (TID) | INTRAMUSCULAR | Status: DC
  Administered 2017-05-27 – 2017-05-31 (×11): 5000 [IU] via SUBCUTANEOUS
  Filled 2017-05-27 (×11): qty 1

## 2017-05-27 MED ORDER — ALBUTEROL SULFATE (2.5 MG/3ML) 0.083% IN NEBU: 3.0000 mL | INHALATION_SOLUTION | Freq: Four times a day (QID) | RESPIRATORY_TRACT | Status: DC | PRN

## 2017-05-27 MED ORDER — MOMETASONE FURO-FORMOTEROL FUM 200-5 MCG/ACT IN AERO
2.0000 | INHALATION_SPRAY | Freq: Two times a day (BID) | RESPIRATORY_TRACT | Status: DC
  Administered 2017-05-27 – 2017-05-31 (×8): 2 via RESPIRATORY_TRACT
  Filled 2017-05-27: qty 8.8

## 2017-05-27 MED ORDER — HYDRALAZINE HCL 50 MG PO TABS
50.0000 mg | ORAL_TABLET | Freq: Three times a day (TID) | ORAL | Status: DC
  Administered 2017-05-27 – 2017-05-29 (×5): 50 mg via ORAL
  Filled 2017-05-27 (×5): qty 1

## 2017-05-27 MED ORDER — METOPROLOL TARTRATE 50 MG PO TABS
50.0000 mg | ORAL_TABLET | Freq: Two times a day (BID) | ORAL | Status: DC
  Administered 2017-05-27 – 2017-05-31 (×8): 50 mg via ORAL
  Filled 2017-05-27 (×8): qty 1

## 2017-05-27 MED ORDER — LOSARTAN POTASSIUM 25 MG PO TABS
25.0000 mg | ORAL_TABLET | Freq: Every day | ORAL | Status: DC
  Administered 2017-05-27: 25 mg via ORAL
  Filled 2017-05-27: qty 1

## 2017-05-27 MED ORDER — MORPHINE SULFATE (PF) 2 MG/ML IV SOLN
2.0000 mg | Freq: Once | INTRAVENOUS | Status: AC
  Administered 2017-05-27: 2 mg via INTRAVENOUS
  Filled 2017-05-27: qty 1

## 2017-05-27 MED ORDER — LEVOTHYROXINE SODIUM 50 MCG PO TABS
50.0000 ug | ORAL_TABLET | Freq: Every day | ORAL | Status: DC
  Administered 2017-05-28 – 2017-05-31 (×4): 50 ug via ORAL
  Filled 2017-05-27 (×4): qty 1

## 2017-05-27 MED ORDER — SODIUM CHLORIDE 0.9 % IV SOLN
INTRAVENOUS | Status: DC
  Administered 2017-05-27 – 2017-05-28 (×3): via INTRAVENOUS

## 2017-05-27 MED ORDER — FAMOTIDINE 20 MG PO TABS
20.0000 mg | ORAL_TABLET | Freq: Every day | ORAL | Status: DC
  Administered 2017-05-27 – 2017-05-31 (×5): 20 mg via ORAL
  Filled 2017-05-27 (×5): qty 1

## 2017-05-27 MED ORDER — MAGNESIUM OXIDE 400 (241.3 MG) MG PO TABS
400.0000 mg | ORAL_TABLET | Freq: Two times a day (BID) | ORAL | Status: DC
  Administered 2017-05-27 – 2017-05-31 (×8): 400 mg via ORAL
  Filled 2017-05-27 (×9): qty 1

## 2017-05-27 MED ORDER — TIOTROPIUM BROMIDE MONOHYDRATE 18 MCG IN CAPS
1.0000 | ORAL_CAPSULE | Freq: Every day | RESPIRATORY_TRACT | Status: DC
  Administered 2017-05-27 – 2017-05-31 (×5): 18 ug via RESPIRATORY_TRACT
  Filled 2017-05-27: qty 5

## 2017-05-27 MED ORDER — FUROSEMIDE 20 MG PO TABS
20.0000 mg | ORAL_TABLET | Freq: Two times a day (BID) | ORAL | Status: DC
  Administered 2017-05-27: 20 mg via ORAL
  Filled 2017-05-27: qty 1

## 2017-05-27 MED ORDER — OXYCODONE HCL 5 MG PO TABS
5.0000 mg | ORAL_TABLET | ORAL | Status: DC | PRN
  Administered 2017-05-28 – 2017-05-30 (×10): 5 mg via ORAL
  Filled 2017-05-27 (×10): qty 1

## 2017-05-27 NOTE — ED Triage Notes (Signed)
Pt arrives with complaints of SOB, states hx of CHF and sates he has not been taking his lasix over the past few days, also states left knee pain, states he ran out of oxycodone and states the pain has increased, awake and alert, tachypenic

## 2017-05-27 NOTE — ED Notes (Signed)
Pt brought back to 5h and placed into bed - protocoled by triage and awaiting dr.

## 2017-05-27 NOTE — ED Notes (Signed)
Admitting MD in room.

## 2017-05-27 NOTE — H&P (Addendum)
[name] is an 82 y.o. male.   Chief Complaint: Knee pain. HPI: This is a 82 year old male who's had about a history of left knee and hip pain. He is being worked up by Sherren Mocha Monday at [facility] in [facility]. He had CT scan of the left hip and was awaiting follow-up for this. He came into the ER today complaining about the left hip and knee pain and that he was out of his narcotic pain medications. During his time in the ER he became nauseated and set up on the side of the bed and suddenly passed out. He said he was in a lot of pain at that point. He denied any chest pain or shortness of breath. He does have a history of coronary artery disease and COPD.  Past Medical History:  Diagnosis Date  . Alcoholism ([place])   . Asthma   . Bipolar affective disorder ([place])   . CHF (congestive heart failure) ([place])   . COPD (chronic obstructive pulmonary disease) ([facility])    COPD  . Coronary artery disease February 2005   PCI and Taxus drug-eluting stent placement to the distal RCA (3.5 x 12 mm)  . Degenerative joint disease   . Essential hypertension   . Hyperlipidemia   . Hypertension   . Prostate CA [facility])    prostate ca dx 79 ys ago;  . Prostate CA  [facility])    prostate ca dx 20 yrs ago  . PVD (peripheral vascular disease) ([place])   . Tobacco abuse     Past Surgical History:  Procedure Laterality Date  . CARDIAC CATHETERIZATION    . CORONARY ANGIOPLASTY WITH STENT PLACEMENT    . TOTAL HIP ARTHROPLASTY     right    Family History  Problem Relation Age of Onset  . Hypertension Mother   . Hyperlipidemia Mother   . Heart attack Mother   . Hypertension Father   . Hyperlipidemia Father   . Heart attack Father    Social History:  reports that he has quit smoking. His smoking use included Cigarettes. He has a 50.00 pack-year smoking history. He has never used smokeless tobacco. He reports that he drinks alcohol. He reports that he does not use drugs.  Allergies:  Allergies   Allergen Reactions  . Amlodipine     Constipation   . Benazepril-Hydrochlorothiazide     Constipation   . Chlorthalidone     Hyponatremia  . Other Other (See Comments)    "ANY BLOOD PRESSURE MEDICATIONS THAT I'VE TRIED" - PT. DOES NOT REMEMBER WHICH ONES     (Not in a hospital admission)  Results for orders placed or performed during the hospital encounter of 05/27/17 (from the past 48 hour(s))  Basic metabolic panel     Status: Abnormal   Collection Time: 05/27/17 12:09 PM  Result Value Ref Range   Sodium 136 135 - 145 mmol/L   Potassium 4.8 3.5 - 5.1 mmol/L   Chloride 103 101 - 111 mmol/L   CO2 20 (L) 22 - 32 mmol/L   Glucose, Bld 85 65 - 99 mg/dL   BUN 37 (H) 6 - 20 mg/dL   Creatinine, Ser 1.71 (H) 0.61 - 1.24 mg/dL   Calcium 8.8 (L) 8.9 - 10.3 mg/dL   GFR calc non Af Amer 36 (L) >60 mL/min   GFR calc Af Amer 42 (L) >60 mL/min    Comment: (NOTE) The eGFR has been calculated using the CKD EPI equation. This calculation  has not been validated in all clinical situations. eGFR's persistently <60 mL/min signify possible Chronic Kidney Disease.    Anion gap 13 5 - 15  CBC     Status: Abnormal   Collection Time: 05/27/17 12:09 PM  Result Value Ref Range   WBC 8.0 3.8 - 10.6 K/uL   RBC 3.99 (L) 4.40 - 5.90 MIL/uL   Hemoglobin 11.5 (L) 13.0 - 18.0 g/dL   HCT 33.8 (L) 40.0 - 52.0 %   MCV 84.8 80.0 - 100.0 fL   MCH 28.7 26.0 - 34.0 pg   MCHC 33.9 32.0 - 36.0 g/dL   RDW 15.0 (H) 11.5 - 14.5 %   Platelets 307 150 - 440 K/uL  Troponin I     Status: None   Collection Time: 05/27/17 12:09 PM  Result Value Ref Range   Troponin I <0.03 <0.03 ng/mL  Brain natriuretic peptide     Status: None   Collection Time: 05/27/17 12:09 PM  Result Value Ref Range   B Natriuretic Peptide 42.0 0.0 - 100.0 pg/mL  Glucose, capillary     Status: None   Collection Time: 05/27/17  2:45 PM  Result Value Ref Range   Glucose-Capillary 86 65 - 99 mg/dL   Dg Chest 2 View  Result Date:  05/27/2017 CLINICAL DATA:  Shortness of Breath EXAM: CHEST  2 VIEW COMPARISON:  September 11, 2016 FINDINGS: There is no appreciable edema or consolidation. There is apparent epicardial fat prominence anteriorly and inferiorly. Heart size and pulmonary vascularity are normal. No adenopathy. There is aortic atherosclerosis. There is degenerative change in the thoracic spine. IMPRESSION: No edema or consolidation. There is aortic atherosclerosis. No evident adenopathy. Electronically Signed   By: Lowella Grip III M.D.   On: 05/27/2017 13:05    Review of Systems  Constitutional: Negative for chills and fever.  HENT: Negative for hearing loss.   Eyes: Negative for blurred vision.  Respiratory: Negative for cough.   Cardiovascular: Negative for chest pain.  Gastrointestinal: Positive for nausea and vomiting.  Genitourinary: Negative for dysuria.  Musculoskeletal: Positive for joint pain.  Skin: Negative for rash.  Neurological: Negative for dizziness.  Psychiatric/Behavioral: Negative for depression.    Blood pressure (!) 147/62, pulse 81, temperature 99.4 F (37.4 C), temperature source Oral, resp. rate (!) 23, height '5\' 7"'  (1.702 m), weight 116.1 kg (256 lb), SpO2 94 %. Physical Exam  Constitutional: He is oriented to person, place, and time. He appears well-developed and well-nourished.  In obvious pain  HENT:  Head: Normocephalic and atraumatic.  Mouth/Throat: No oropharyngeal exudate.  Oral mucosa dry  Eyes: Pupils are equal, round, and reactive to light. Left eye exhibits no discharge. No scleral icterus.  Neck: Neck supple. No JVD present. No tracheal deviation present. No thyromegaly present.  Cardiovascular: Normal rate and regular rhythm.   No murmur heard. Respiratory: Breath sounds normal. No respiratory distress. He has no wheezes. He exhibits no tenderness.  GI: Soft. Bowel sounds are normal. He exhibits no distension and no mass.  Musculoskeletal: Normal range of  motion. He exhibits no edema.  Tenderness to left hip with movement. Also pain in the left knee but no swelling.  Lymphadenopathy:    He has no cervical adenopathy.  Neurological: He is alert and oriented to person, place, and time.  Skin: Skin is warm and dry.     Assessment/Plan 1. Syncope. Patient appears to be a little bit dehydrated on his laboratory workup. He says he has not been  eating good because of the pain lasts days. He had severe pain and got nauseated before he had the syncopal episode. Suspect this is likely vasovagal from the pain. However with his cardiac history would like to observe him and check cardiac enzymes. Also because of the mild dehydration and history of congestive heart failure would like to rehydrate him gently under controlled conditions. Check cardiac enzymes and observe him overnight 2. Nausea vomiting. This likely vasovagal from the pain. We'll give Zofran when necessary for the symptoms and then treat his pain. 3. Left hip and knee pain. A letter results of his CT scan of the left hip he has severe degenerative osteoarthritis. I'll go ahead and consult orthopedics to see him since he is Re: Under their care and get physical therapy to see him also. He may ultimately need hip replacement. 4. History of congestive heart failure. He has an EF of 45% and is followed by Dr. Fletcher Anon. We'll continue his current medications and hydrate him gently because of his mild dehydration. 5. Coronary artery disease. He's had no cardiac symptoms. We'll check cardiac enzymes and monitor for any arrhythmias 6. Stage III chronic renal disease. Creatinine is just a little bit above his baseline. Again think this may be from mild dehydration. We'll gently hydrate and watch for any signs of fluid overload. Recheck creatinine in the morning  Total time spent 45 minutes  Baxter Hire, MD 05/27/2017, 5:27 PM

## 2017-05-27 NOTE — ED Notes (Signed)
Respiratory notified to pick up VBG

## 2017-05-27 NOTE — Care Management Obs Status (Signed)
[place] NOTIFICATION   Patient Details  Name: [name] MRN: [id number] Date of Birth: May 22, 1935   Medicare Observation Status Notification Given:  Yes (hip and knee pain.syncope)    Ival Bible, RN 05/27/2017, 6:20 PM

## 2017-05-27 NOTE — ED Provider Notes (Signed)
[facility] Emergency Department Provider Note  ____________________________________________   First MD Initiated Contact with Patient 05/27/17 1528     (approximate)  I have reviewed the triage vital signs and the nursing notes.   HISTORY  Chief Complaint Shortness of Breath   HPI [name] is a 82 y.o. male history of asthma, bipolar, congestive heart failure and COPD  Patient presents today, he notes that he is here because of ongoing left knee pain. He's been seen by orthopedics and was on oxycodone, but ran out about one to 2 days ago. Reports the pain is severe at times and is lead that he cannot longer walk on it because of severity of pain. He was diagnosed with "arthritis" according to the patient.  Denies any fevers or chills. He has not seen any swelling or redness in the leg. No numbness or tingling or cold or blue foot.  Of note, the patient reports that he got up and due to the pain he may have passed out. Denies any headache nausea vomiting or weakness.     Past Medical History:  Diagnosis Date  . Alcoholism ([place])   . Asthma   . Bipolar affective disorder ([place])   . CHF (congestive heart failure) ([place])   . COPD (chronic obstructive pulmonary disease) (H[facility])    COPD  . Coronary artery disease February 2005   PCI and Taxus drug-eluting stent placement to the distal RCA (3.5 x 12 mm)  . Degenerative joint disease   . Essential hypertension   . Hyperlipidemia   . Hypertension   . Prostate CA [facility])    prostate ca dx 66 ys ago;  . Prostate CA [facility])    prostate ca dx 20 yrs ago  . PVD (peripheral vascular disease) ([place])   . Tobacco abuse     Patient Active Problem List   Diagnosis Date Noted  . Syncope 05/27/2017  . Meningioma ([place]) 01/05/2017  . GI bleed 12/01/2016  . Alcohol abuse 10/30/2016  . Chronic systolic heart failure ([place]) 10/04/2016  . COPD (chronic obstructive pulmonary disease) with chronic bronchitis ([place])  10/04/2016  . Obstructive sleep apnea 10/04/2016  . Hyponatremia 08/31/2016  . UTI (lower urinary tract infection) 08/31/2016  . Exertional dyspnea 02/11/2016  . Hyperlipidemia 12/03/2015  . Essential hypertension   . Coronary artery disease 01/19/2004    Past Surgical History:  Procedure Laterality Date  . CARDIAC CATHETERIZATION    . CORONARY ANGIOPLASTY WITH STENT PLACEMENT    . TOTAL HIP ARTHROPLASTY     right    Prior to Admission medications   Medication Sig Start Date End Date Taking? Authorizing Provider  albuterol (PROAIR HFA) 108 (90 Base) MCG/ACT inhaler Inhale 2 puffs into the lungs every 6 (six) hours as needed for wheezing or shortness of breath. Reported on 04/21/2016 01/21/16 05/27/17 Yes [provider]  aspirin EC 81 MG tablet Take 81 mg by mouth every morning.   Yes [provider]  atorvastatin (LIPITOR) 40 MG tablet Take 40 mg by mouth daily.   Yes [provider]  furosemide (LASIX) 40 MG tablet Take 0.5 tablets (20 mg total) by mouth 2 (two) times daily. 09/13/16  Yes Dustin Flock, MD  hydrALAZINE (APRESOLINE) 25 MG tablet Take 2 tablets (50 mg total) by mouth every 8 (eight) hours. 10/30/16  Yes Darylene Price A, FNP  isosorbide mononitrate (IMDUR) 30 MG 24 hr tablet Take 1 tablet (30 mg total) by mouth daily. 11/23/16 05/27/17 Yes  [name], MD  ketoconazole (NIZORAL) 2 % shampoo Apply 1 application topically 3 (three) times a week.  11/21/16  Yes [provider]  levothyroxine (SYNTHROID, LEVOTHROID) 50 MCG tablet Take 50 mcg by mouth daily before breakfast.   Yes [provider]  magnesium oxide (MAG-OX) 400 MG tablet Take 400 mg by mouth 2 (two) times daily.    Yes [provider]  metoprolol (LOPRESSOR) 50 MG tablet Take 1 tablet (50 mg total) by mouth 2 (two) times daily. 09/06/16  Yes Epifanio Lesches, MD  nitroGLYCERIN (NITROSTAT) 0.4 MG SL tablet Take 0.4 mg by mouth every 5 (five) minutes x 3  doses as needed for chest pain. As needed for chest pain 12/06/15  Yes [provider]  ondansetron (ZOFRAN) 4 MG tablet Take 1 tablet (4 mg total) by mouth every 8 (eight) hours as needed for nausea or vomiting. 03/10/17  Yes Alfred Levins, [place], MD  vitamin B-6 (PYRIDOXINE) 25 MG tablet Take 25 mg by mouth daily.   Yes [provider]  famotidine (PEPCID) 20 MG tablet Take 1 tablet (20 mg total) by mouth daily. Patient not taking: Reported on 05/27/2017 09/07/16   Epifanio Lesches, MD  fluticasone-salmeterol (ADVAIR HFA) [phone] MCG/ACT inhaler Inhale 2 puffs into the lungs 2 (two) times daily.    [provider]  gabapentin (NEURONTIN) 300 MG capsule Take 300 mg by mouth 3 (three) times daily. 05/22/17   [provider]  losartan (COZAAR) 25 MG tablet Take 25 mg by mouth daily. 05/18/17   [provider]  Tiotropium Bromide Monohydrate (SPIRIVA RESPIMAT) 1.25 MCG/ACT AERS Inhale 1 puff into the lungs daily.    [provider]    Allergies Amlodipine; Benazepril-hydrochlorothiazide; Chlorthalidone; and Other  Family History  Problem Relation Age of Onset  . Hypertension Mother   . Hyperlipidemia Mother   . Heart attack Mother   . Hypertension Father   . Hyperlipidemia Father   . Heart attack Father     Social History Social History  Substance Use Topics  . Smoking status: Former Smoker    Packs/day: 1.00    Years: 50.00    Types: Cigarettes  . Smokeless tobacco: Never Used  . Alcohol use Yes     Comment: social     Review of Systems Constitutional: No fever/chills. Eyes: No visual changes. ENT: No sore throat. Cardiovascular: Denies chest pain. Respiratory: Denies shortness of breath. Gastrointestinal: No abdominal pain.  No nausea, no vomiting.  No diarrhea.  No constipation. Genitourinary: Negative for dysuria. Musculoskeletal: Negative for back pain. Skin: Negative for rash. Neurological: Negative for headaches, focal  weakness or numbness.    ____________________________________________   PHYSICAL EXAM:  VITAL SIGNS: ED Triage Vitals  Enc Vitals Group     BP 05/27/17 1207 (!) 155/65     Pulse Rate 05/27/17 1207 87     Resp 05/27/17 1207 (!) 22     Temp 05/27/17 1207 99.4 F (37.4 C)     Temp Source 05/27/17 1207 Oral     SpO2 05/27/17 1207 96 %     Weight 05/27/17 1204 256 lb (116.1 kg)     Height 05/27/17 1204 5\' 7"  (1.702 m)     Head Circumference --      Peak Flow --      Pain Score 05/27/17 1204 10     Pain Loc --      Pain Edu? --      Excl. in [place]? --  Constitutional: Alert and oriented. Seems slightly somnolent, but in no distress. Well oriented. Eyes: Conjunctivae are normal. Head: Atraumatic. Nose: No congestion/rhinnorhea. Mouth/Throat: Mucous membranes are slightly dry. Neck: No stridor.   Cardiovascular: Normal rate, regular rhythm. Grossly normal heart sounds.  Good peripheral circulation. Respiratory: Normal respiratory effort.  No retractions. Lungs CTAB. Gastrointestinal: Soft and nontender. No distention. Musculoskeletal: No lower extremity tenderness nor edema. Left lower extremity is intact dorsalis pedis and posterior tibial. Normal well-perfused left lower foot. No left knee effusion, warmth, erythema or other obvious abnormality. Able to range the knee, reports pain through the full range of motion of the knee. No thigh pain or swelling of the extremity. Neurologic:  Normal speech and language. No gross focal neurologic deficits are appreciated.  Skin:  Skin is warm, dry and intact. No rash noted. Psychiatric: Mood and affect are normal. Speech and behavior are normal.  ____________________________________________   LABS (all labs ordered are listed, but only abnormal results are displayed)  Labs Reviewed  BASIC METABOLIC PANEL - Abnormal; Notable for the following:       Result Value   CO2 20 (*)    BUN 37 (*)    Creatinine, Ser 1.71 (*)    Calcium 8.8  (*)    GFR calc non Af Amer 36 (*)    GFR calc Af Amer 42 (*)    All other components within normal limits  CBC - Abnormal; Notable for the following:    RBC 3.99 (*)    Hemoglobin 11.5 (*)    HCT 33.8 (*)    RDW 15.0 (*)    All other components within normal limits  BLOOD GAS, VENOUS - Abnormal; Notable for the following:    pO2, Ven <31.0 (*)    All other components within normal limits  TROPONIN I  BRAIN NATRIURETIC PEPTIDE  GLUCOSE, CAPILLARY  TROPONIN I  BASIC METABOLIC PANEL  TROPONIN I  TROPONIN I   ____________________________________________  EKG  Reviewed and interpreted me at 1205 Ventricular rate 90 Care as 80 QTc 460 Normal sinus rhythm, no evidence of acute ischemia. No arrhythmia noted. Consider possible left atrial enlargement ____________________________________________  RADIOLOGY  Dg Chest 2 View  Result Date: 05/27/2017 CLINICAL DATA:  Shortness of Breath EXAM: CHEST  2 VIEW COMPARISON:  September 11, 2016 FINDINGS: There is no appreciable edema or consolidation. There is apparent epicardial fat prominence anteriorly and inferiorly. Heart size and pulmonary vascularity are normal. No adenopathy. There is aortic atherosclerosis. There is degenerative change in the thoracic spine. IMPRESSION: No edema or consolidation. There is aortic atherosclerosis. No evident adenopathy. Electronically Signed   By: Lowella Grip III M.D.   On: 05/27/2017 13:05    ____________________________________________   PROCEDURES  Procedure(s) performed: None  Procedures  Critical Care performed: No  ____________________________________________   INITIAL IMPRESSION / ASSESSMENT AND PLAN / ED COURSE  Pertinent labs & imaging results that were available during my care of the patient were reviewed by me and considered in my medical decision making (see chart for details).  Patient transfer evaluation of left knee pain. Reviewed orthopedics notes, is also had a CT  of the area. There is no obvious evidence of any injury, and he denies any falls or injuries since he was seen in diagnosed with arthritis. He does request additional pain medicine, as he had run out. His initial complaint was that of knee pain, but while standing he did have a brief syncopal type episode with complete recovery thereafter.  Clinical Course as of May 27 2224  Sun May 27, 2017  1655 Clarified with RN, patient had an unresponsive episode, appeared to be gagging and then vomiting. Then fell back and became unresponsive briefly. Quickly regained consciousness after laying him in bed.  [MQ]    Clinical Course User Index [MQ] Delman Kitten, MD   Due to the patient's episode of witnessed syncope, previous history of congestive heart failure, now slightly elevated BUN I suspect he may have had a vasovagal or other episode of possible orthostatic hypertension. Discussed with the hospitalist, also discussed the patient and we will admit him for further workup  ____________________________________________   FINAL CLINICAL IMPRESSION(S) / ED DIAGNOSES  Final diagnoses:  Syncope and collapse  Left knee pain, unspecified chronicity      NEW MEDICATIONS STARTED DURING THIS VISIT:  Current Discharge Medication List       Note:  This document was prepared using Dragon voice recognition software and may include unintentional dictation errors.     Delman Kitten, MD 05/27/17 2226

## 2017-05-27 NOTE — ED Notes (Signed)
Attempted to call report x 1 nurse did not answer.

## 2017-05-28 ENCOUNTER — Observation Stay: Payer: Medicare Other

## 2017-05-28 ENCOUNTER — Inpatient Hospital Stay: Payer: Medicare Other

## 2017-05-28 DIAGNOSIS — I739 Peripheral vascular disease, unspecified: Secondary | ICD-10-CM | POA: Diagnosis present

## 2017-05-28 DIAGNOSIS — M4726 Other spondylosis with radiculopathy, lumbar region: Secondary | ICD-10-CM | POA: Diagnosis present

## 2017-05-28 DIAGNOSIS — E039 Hypothyroidism, unspecified: Secondary | ICD-10-CM | POA: Diagnosis present

## 2017-05-28 DIAGNOSIS — E875 Hyperkalemia: Secondary | ICD-10-CM | POA: Diagnosis present

## 2017-05-28 DIAGNOSIS — E785 Hyperlipidemia, unspecified: Secondary | ICD-10-CM | POA: Diagnosis present

## 2017-05-28 DIAGNOSIS — G629 Polyneuropathy, unspecified: Secondary | ICD-10-CM | POA: Diagnosis present

## 2017-05-28 DIAGNOSIS — Z8546 Personal history of malignant neoplasm of prostate: Secondary | ICD-10-CM | POA: Diagnosis not present

## 2017-05-28 DIAGNOSIS — Z79899 Other long term (current) drug therapy: Secondary | ICD-10-CM | POA: Diagnosis not present

## 2017-05-28 DIAGNOSIS — K5900 Constipation, unspecified: Secondary | ICD-10-CM | POA: Diagnosis present

## 2017-05-28 DIAGNOSIS — N184 Chronic kidney disease, stage 4 (severe): Secondary | ICD-10-CM | POA: Diagnosis present

## 2017-05-28 DIAGNOSIS — I509 Heart failure, unspecified: Secondary | ICD-10-CM | POA: Diagnosis present

## 2017-05-28 DIAGNOSIS — M25562 Pain in left knee: Secondary | ICD-10-CM | POA: Diagnosis present

## 2017-05-28 DIAGNOSIS — Z96649 Presence of unspecified artificial hip joint: Secondary | ICD-10-CM | POA: Diagnosis present

## 2017-05-28 DIAGNOSIS — Z87891 Personal history of nicotine dependence: Secondary | ICD-10-CM | POA: Diagnosis not present

## 2017-05-28 DIAGNOSIS — N179 Acute kidney failure, unspecified: Secondary | ICD-10-CM | POA: Diagnosis present

## 2017-05-28 DIAGNOSIS — I130 Hypertensive heart and chronic kidney disease with heart failure and stage 1 through stage 4 chronic kidney disease, or unspecified chronic kidney disease: Secondary | ICD-10-CM | POA: Diagnosis present

## 2017-05-28 DIAGNOSIS — J449 Chronic obstructive pulmonary disease, unspecified: Secondary | ICD-10-CM | POA: Diagnosis present

## 2017-05-28 DIAGNOSIS — Z955 Presence of coronary angioplasty implant and graft: Secondary | ICD-10-CM | POA: Diagnosis not present

## 2017-05-28 DIAGNOSIS — Z7982 Long term (current) use of aspirin: Secondary | ICD-10-CM | POA: Diagnosis not present

## 2017-05-28 DIAGNOSIS — Z888 Allergy status to other drugs, medicaments and biological substances status: Secondary | ICD-10-CM | POA: Diagnosis not present

## 2017-05-28 DIAGNOSIS — I2510 Atherosclerotic heart disease of native coronary artery without angina pectoris: Secondary | ICD-10-CM | POA: Diagnosis present

## 2017-05-28 LAB — BASIC METABOLIC PANEL
Anion gap: 8 (ref 5–15)
BUN: 42 mg/dL — ABNORMAL HIGH (ref 6–20)
CO2: 24 mmol/L (ref 22–32)
CREATININE: 2.05 mg/dL — AB (ref 0.61–1.24)
Calcium: 8.3 mg/dL — ABNORMAL LOW (ref 8.9–10.3)
Chloride: 101 mmol/L (ref 101–111)
GFR calc non Af Amer: 29 mL/min — ABNORMAL LOW (ref >60)
GFR, EST AFRICAN AMERICAN: 34 mL/min — AB (ref >60)
Glucose, Bld: 118 mg/dL — ABNORMAL HIGH (ref 65–99)
POTASSIUM: 5.6 mmol/L — AB (ref 3.5–5.1)
SODIUM: 133 mmol/L — AB (ref 135–145)

## 2017-05-28 LAB — TROPONIN I

## 2017-05-28 MED ORDER — SODIUM POLYSTYRENE SULFONATE 15 GM/60ML PO SUSP
30.0000 g | Freq: Once | ORAL | Status: AC
  Administered 2017-05-28: 30 g via ORAL
  Filled 2017-05-28: qty 120

## 2017-05-28 MED ORDER — TRIAMCINOLONE ACETONIDE 40 MG/ML IJ SUSP
80.0000 mg | Freq: Once | INTRAMUSCULAR | Status: AC
  Administered 2017-05-28: 80 mg via INTRA_ARTICULAR
  Filled 2017-05-28: qty 2

## 2017-05-28 MED ORDER — PREDNISONE 20 MG PO TABS
20.0000 mg | ORAL_TABLET | Freq: Every day | ORAL | Status: DC
  Administered 2017-05-28 – 2017-05-30 (×3): 20 mg via ORAL
  Filled 2017-05-28 (×3): qty 1

## 2017-05-28 MED ORDER — BUPIVACAINE HCL (PF) 0.5 % IJ SOLN
10.0000 mL | Freq: Once | INTRAMUSCULAR | Status: AC
  Administered 2017-05-28: 10 mL via INTRA_ARTICULAR
  Filled 2017-05-28: qty 10

## 2017-05-28 NOTE — Consult Note (Signed)
ORTHOPAEDIC CONSULTATION  REQUESTING PHYSICIAN: Loletha Grayer, MD  Chief Complaint:   Left hip and knee pain.  History of Present Illness: [name] is a 82 y.o. male with multiple medical problems including alcoholism, asthma, bipolar affective disorder, COPD, congestive heart failure, coronary artery disease, hyperlipidemia, hypertension, prostate CA, peripheral vascular disease, and tobacco abuse who first developed the acute onset of what he describes as left knee pain one month ago. He suddenly was unable to bear weight on his knee and localized the pain to the medial aspect of his knee. He denies any fall or other injury that may have contributed to the onset of these symptoms. He presented to [facility] in [place] where he was seen by Reche Dixon, PA-C. He was diagnosed with left hip arthritis and sent for a CT scan to rule out an acute fracture around the previously placed hardware. The patient states that he is about 50 years status post an open reduction and internal fixation of a left femur fracture. The patient underwent a CT scan on 05/15/17. However, before he could return for follow-up, he presented to the emergency room on 05/27/17 complaining of worsening left lower extremity pain. He was ready to be discharged home but became nauseated and passed out, so he was admitted for further workup. While in the hospital, I have been asked to come see this man for evaluation of his left hip and knee pain.  Past Medical History:  Diagnosis Date  . Alcoholism ([place])   . Asthma   . Bipolar affective disorder ([place])   . CHF (congestive heart failure) ([place])   . COPD (chronic obstructive pulmonary disease) ([facility])    COPD  . Coronary artery disease February 2005   PCI and Taxus drug-eluting stent placement to the distal RCA (3.5 x 12 mm)  . Degenerative joint disease   . Essential hypertension   . Hyperlipidemia   .  Hypertension   . Prostate CA [facility])    prostate ca dx 44 ys ago;  . Prostate CA [facility])    prostate ca dx 20 yrs ago  . PVD (peripheral vascular disease) ([place])   . Tobacco abuse    Past Surgical History:  Procedure Laterality Date  . CARDIAC CATHETERIZATION    . CORONARY ANGIOPLASTY WITH STENT PLACEMENT    . TOTAL HIP ARTHROPLASTY     right   Social History   Social History  . Marital status: Legally Separated    Spouse name: N/A  . Number of children: N/A  . Years of education: N/A   Occupational History  . retired    Social History Main Topics  . Smoking status: Former Smoker    Packs/day: 1.00    Years: 50.00    Types: Cigarettes  . Smokeless tobacco: Never Used  . Alcohol use Yes     Comment: social   . Drug use: No  . Sexual activity: Not Asked   Other Topics Concern  . None   Social History Narrative  . None   Family History  Problem Relation Age of Onset  . Hypertension Mother   . Hyperlipidemia Mother   . Heart attack Mother   . Hypertension Father   . Hyperlipidemia Father   . Heart attack Father    Allergies  Allergen Reactions  . Amlodipine     Constipation   . Benazepril-Hydrochlorothiazide     Constipation   . Chlorthalidone     Hyponatremia  . Other Other (See Comments)    "  ANY BLOOD PRESSURE MEDICATIONS THAT I'VE TRIED" - PT. DOES NOT REMEMBER WHICH ONES   Prior to Admission medications   Medication Sig Start Date End Date Taking? Authorizing Provider  albuterol (PROAIR HFA) 108 (90 Base) MCG/ACT inhaler Inhale 2 puffs into the lungs every 6 (six) hours as needed for wheezing or shortness of breath. Reported on 04/21/2016 01/21/16 05/27/17 Yes [provider]  aspirin EC 81 MG tablet Take 81 mg by mouth every morning.   Yes [provider]  atorvastatin (LIPITOR) 40 MG tablet Take 40 mg by mouth daily.   Yes [provider]  fluticasone-salmeterol (ADVAIR HFA) 115-21 MCG/ACT inhaler Inhale 2 puffs into the lungs 2  (two) times daily.   Yes [provider]  furosemide (LASIX) 40 MG tablet Take 0.5 tablets (20 mg total) by mouth 2 (two) times daily. 09/13/16  Yes Dustin Flock, MD  gabapentin (NEURONTIN) 300 MG capsule Take 100 mg by mouth 3 (three) times daily.  05/22/17  Yes [provider]  hydrALAZINE (APRESOLINE) 25 MG tablet Take 2 tablets (50 mg total) by mouth every 8 (eight) hours. 10/30/16  Yes Hackney, Otila Kluver A, FNP  HYDROcodone-acetaminophen (NORCO/VICODIN) 5-325 MG tablet Take 1 tablet by mouth every 6 (six) hours as needed for moderate pain.   Yes [provider]  isosorbide mononitrate (IMDUR) 30 MG 24 hr tablet Take 1 tablet (30 mg total) by mouth daily. 11/23/16 05/27/17 Yes [name], MD  ketoconazole (NIZORAL) 2 % shampoo Apply 1 application topically 3 (three) times a week.  11/21/16  Yes [provider]  levothyroxine (SYNTHROID, LEVOTHROID) 50 MCG tablet Take 50 mcg by mouth daily before breakfast.   Yes [provider]  magnesium oxide (MAG-OX) 400 MG tablet Take 400 mg by mouth 2 (two) times daily.    Yes [provider]  metoprolol (LOPRESSOR) 50 MG tablet Take 1 tablet (50 mg total) by mouth 2 (two) times daily. 09/06/16  Yes Epifanio Lesches, MD  oxycodone (OXY-IR) 5 MG capsule Take 5 mg by mouth every 4 (four) hours as needed.   Yes [provider]  vitamin B-6 (PYRIDOXINE) 25 MG tablet Take 25 mg by mouth daily.   Yes [provider]  famotidine (PEPCID) 20 MG tablet Take 1 tablet (20 mg total) by mouth daily. Patient not taking: Reported on 05/27/2017 09/07/16   Epifanio Lesches, MD  losartan (COZAAR) 25 MG tablet Take 25 mg by mouth daily. 05/18/17   [provider]  nitroGLYCERIN (NITROSTAT) 0.4 MG SL tablet Take 0.4 mg by mouth every 5 (five) minutes x 3 doses as needed for chest pain. As needed for chest pain 12/06/15   [provider]  ondansetron (ZOFRAN) 4 MG tablet Take 1 tablet (4  mg total) by mouth every 8 (eight) hours as needed for nausea or vomiting. 03/10/17   Alfred Levins, [place], MD  Tiotropium Bromide Monohydrate (SPIRIVA RESPIMAT) 1.25 MCG/ACT AERS Inhale 1 puff into the lungs daily.    [provider]   Dg Chest 2 View  Result Date: 05/27/2017 CLINICAL DATA:  Shortness of Breath EXAM: CHEST  2 VIEW COMPARISON:  September 11, 2016 FINDINGS: There is no appreciable edema or consolidation. There is apparent epicardial fat prominence anteriorly and inferiorly. Heart size and pulmonary vascularity are normal. No adenopathy. There is aortic atherosclerosis. There is degenerative change in the thoracic spine. IMPRESSION: No edema or consolidation. There is aortic atherosclerosis. No evident adenopathy. Electronically Signed   By: Lowella Grip III  M.D.   On: 05/27/2017 13:05   Dg Hip Unilat W Or W/o Pelvis 2-3 Views Left  Result Date: 05/28/2017 CLINICAL DATA:  Pain EXAM: DG HIP (WITH OR WITHOUT PELVIS) 2-3V LEFT COMPARISON:  CT left hip May 15, 2017 FINDINGS: Frontal pelvis as well as frontal and lateral left hip images were obtained. There is a total hip replacement on the right with prosthetic components well-seated. On the left, there is plate and screw fixation for a previous fracture. The proximal aspect of the screw is in the proximal femoral head along the inferior aspect. There is no acute fracture or dislocation. There is moderate narrowing of the left hip joint. No erosive change. There is a penile prosthesis. There is a stent in the right superficial femoral artery. There is iliac artery atherosclerosis. IMPRESSION: Screw and plate fixation on the left with no acute fracture or dislocation. Moderate narrowing left hip joint. Total hip replacement right with prosthetic components well-seated. Penile prosthesis present. No acute fracture or dislocation. Bilateral iliac artery atherosclerosis. Stent in right common femoral artery. Electronically Signed   By:  Lowella Grip III M.D.   On: 05/28/2017 11:10    Positive ROS: All other systems have been reviewed and were otherwise negative with the exception of those mentioned in the HPI and as above.  Physical Exam: General:  Alert, no acute distress Psychiatric:  Patient is competent for consent with normal mood and affect   Cardiovascular:  No pedal edema Respiratory:  No wheezing, non-labored breathing GI:  Abdomen is soft and non-tender Skin:  No lesions in the area of chief complaint Neurologic:  Sensation intact distally Lymphatic:  No axillary or cervical lymphadenopathy  Orthopedic Exam:  Orthopedic examination is limited to the left hip and lower extremity. Examination of the left hip demonstrates a well-healed surgical incision over the lateral aspect of the left hip and thigh. There is minimal tenderness to palpation of the left trochanteric region, but no tenderness along the thigh nor over the hip anteriorly. Gentle log rolling of the left lower extremity does not re-create the pain so long as he does not move his knee. He is able to gently flex his hip to 45 without discomfort. No crepitance is observed or felt by the patient.  Examination of his left knee demonstrates marked tenderness to palpation over the medial aspect of the knee. He has only minimal tenderness to palpation along the lateral joint line. He has mild to moderate tenderness to palpation around the peripatellar region. He is able to tolerate knee flexion from 0-45 but has increased pain with any attempted varus or valgus stressing of the knee. Skin inspection around the knee is unremarkable. There is no swelling, erythema, ecchymosis, or abrasions. There does not appear to be any significant effusion. He is neurovascularly intact to the left lower extremity and foot.  X-rays:  X-rays of the pelvis and left hip are available for review. These films demonstrate an old fixed angle plate and screw construct. The hardware  appears to be intact and without evidence of loosening, other than the inferior most screw which appears broken. The portion of the hardware following the femoral neck into the femoral head is quite inferior in the head, but does not appear to be violating the femoral head. Mild degenerative changes of the left hip joint are noted, as manifested by mild narrowing of the clear space.  A CT scan of the pelvis and left hip also is available for review. The findings  are as described above.  Assessment: 1. Acute left knee pain of unclear etiology. 2. Early degenerative joint disease of left hip. 3. Status post ORIF of old intertrochanteric/subtrochanteric left hip fracture with retained hardware.  Plan: The treatment options are discussed with the patient. Based on the patient's examination and complaints, it does appear as though the majority of his symptoms are coming from his knee rather than his hip. Therefore, I will proceed with ordering x-rays of his left knee. In addition, in order to help sort out how much of his pain is coming from his knee versus his hip, I have offered and he has accepted an intra-articular injection into the left knee. This injection is performed sterilely utilizing 2 cc of Kenalog 40 and 8 cc of 0.5% Sensorcaine. The patient tolerated the procedure well.  Thank you for asking me to participate in the care of this most pleasant yet unfortunate man. I will be happy to follow him with you.  Pascal Lux, MD  Beeper #:  [phone]  05/28/2017 5:31 PM

## 2017-05-28 NOTE — Progress Notes (Signed)
PT Cancellation Note  Patient Details Name: [name] MRN: [id number] DOB: [birth year]   Cancelled Treatment:    Reason Eval/Treat Not Completed: Other (comment). Chart reviewed. PT holding evaluation secondary to pending ortho consult for L hip pain and high potassium lab value. Will re-attempt when consult completed.   Donaciano Eva, PT, SPT  [place] 05/28/2017, 8:35 AM

## 2017-05-28 NOTE — Progress Notes (Signed)
Notified Dr. Earleen Newport via phone of patient complaint of chest pain initially described as pressure but then later described as "burning".  VSS, NSR on telemetry, no acute distress.  No new orders.  Continue to monitor and will give AM meds as scheduled.

## 2017-05-28 NOTE — Progress Notes (Signed)
Patient ID: [name], male   DOB: 05-14-1935, 82 y.o.   MRN: [id number]  [facility] PROGRESS NOTE  [name] TDV:[id number] DOB: [birth year] DOA: 05/27/2017 PCP: Marguerita Merles, MD  HPI/Subjective: Patient states he has left knee pain. He was seen by orthopedics in the past and they thought it was left hip pain. Patient having severe pain and he ran out of his pain medications. He does not like to take his pain medications. He had some nausea but no vomiting and passed out for a few seconds. Today he was found to hot via high potassium.  Objective: Vitals:   05/28/17 1002 05/28/17 1342  BP: (!) 127/57 (!) 155/68  Pulse: 80 76  Resp: 16   Temp:      Filed Weights   05/27/17 1204  Weight: 116.1 kg (256 lb)    ROS: Review of Systems  Constitutional: Negative for chills and fever.  Eyes: Negative for blurred vision.  Respiratory: Negative for cough and shortness of breath.   Cardiovascular: Negative for chest pain.  Gastrointestinal: Negative for abdominal pain, constipation, diarrhea, nausea and vomiting.  Genitourinary: Negative for dysuria.  Musculoskeletal: Positive for joint pain.  Neurological: Negative for dizziness and headaches.   Exam: Physical Exam  Constitutional: He is oriented to person, place, and time.  HENT:  Nose: No mucosal edema.  Mouth/Throat: No oropharyngeal exudate or posterior oropharyngeal edema.  Eyes: Conjunctivae, EOM and lids are normal. Pupils are equal, round, and reactive to light.  Neck: No JVD present. Carotid bruit is not present. No edema present. No thyroid mass and no thyromegaly present.  Cardiovascular: S1 normal and S2 normal.  Exam reveals no gallop.   No murmur heard. Pulses:      Dorsalis pedis pulses are 2+ on the right side, and 2+ on the left side.  Respiratory: No respiratory distress. He has no wheezes. He has no rhonchi. He has no rales.  GI: Soft. Bowel sounds are normal. He exhibits distension. There is no  tenderness.  Musculoskeletal:       Right shoulder: He exhibits no swelling.       Left hip: He exhibits decreased range of motion and tenderness.       Left knee: He exhibits decreased range of motion. Tenderness found.  Lymphadenopathy:    He has no cervical adenopathy.  Neurological: He is alert and oriented to person, place, and time. No cranial nerve deficit.  Skin: Skin is warm. No rash noted. Nails show no clubbing.  Psychiatric: He has a normal mood and affect.      Data Reviewed: Basic Metabolic Panel:  Recent Labs Lab 05/27/17 1209 05/28/17 0059  NA 136 133*  K 4.8 5.6*  CL 103 101  CO2 20* 24  GLUCOSE 85 118*  BUN 37* 42*  CREATININE 1.71* 2.05*  CALCIUM 8.8* 8.3*   CBC:  Recent Labs Lab 05/27/17 1209  WBC 8.0  HGB 11.5*  HCT 33.8*  MCV 84.8  PLT 307   Cardiac Enzymes:  Recent Labs Lab 05/27/17 1209 05/27/17 1939 05/28/17 0059 05/28/17 0747  TROPONINI <0.03 <0.03 <0.03 <0.03   BNP (last 3 results)  Recent Labs  05/27/17 1209  BNP 42.0     CBG:  Recent Labs Lab 05/27/17 1445  GLUCAP 86      Studies: Dg Chest 2 View  Result Date: 05/27/2017 CLINICAL DATA:  Shortness of Breath EXAM: CHEST  2 VIEW COMPARISON:  September 11, 2016 FINDINGS: There is no  appreciable edema or consolidation. There is apparent epicardial fat prominence anteriorly and inferiorly. Heart size and pulmonary vascularity are normal. No adenopathy. There is aortic atherosclerosis. There is degenerative change in the thoracic spine. IMPRESSION: No edema or consolidation. There is aortic atherosclerosis. No evident adenopathy. Electronically Signed   By: Lowella Grip III M.D.   On: 05/27/2017 13:05   Dg Hip Unilat W Or W/o Pelvis 2-3 Views Left  Result Date: 05/28/2017 CLINICAL DATA:  Pain EXAM: DG HIP (WITH OR WITHOUT PELVIS) 2-3V LEFT COMPARISON:  CT left hip May 15, 2017 FINDINGS: Frontal pelvis as well as frontal and lateral left hip images were obtained.  There is a total hip replacement on the right with prosthetic components well-seated. On the left, there is plate and screw fixation for a previous fracture. The proximal aspect of the screw is in the proximal femoral head along the inferior aspect. There is no acute fracture or dislocation. There is moderate narrowing of the left hip joint. No erosive change. There is a penile prosthesis. There is a stent in the right superficial femoral artery. There is iliac artery atherosclerosis. IMPRESSION: Screw and plate fixation on the left with no acute fracture or dislocation. Moderate narrowing left hip joint. Total hip replacement right with prosthetic components well-seated. Penile prosthesis present. No acute fracture or dislocation. Bilateral iliac artery atherosclerosis. Stent in right common femoral artery. Electronically Signed   By: Lowella Grip III M.D.   On: 05/28/2017 11:10    Scheduled Meds: . aspirin EC  81 mg Oral [id number]  . atorvastatin  40 mg Oral Daily  . famotidine  20 mg Oral Daily  . gabapentin  300 mg Oral TID  . heparin  5,000 Units Subcutaneous Q8H  . hydrALAZINE  50 mg Oral Q8H  . isosorbide mononitrate  30 mg Oral Daily  . levothyroxine  50 mcg Oral QAC breakfast  . magnesium oxide  400 mg Oral BID  . metoprolol tartrate  50 mg Oral BID  . mometasone-formoterol  2 puff Inhalation BID  . predniSONE  20 mg Oral Q breakfast  . tiotropium  1 capsule Inhalation Daily  . vitamin B-6  25 mg Oral Daily   Continuous Infusions: . sodium chloride 75 mL/hr at 05/28/17 0831    Assessment/Plan:  1. Hyperkalemia with a potassium of 5.6 today. Dose of Kayexalate given. Hold Cozaar. Recheck potassium tomorrow morning. Monitor for bowel movements. Continue IV fluid hydration. 2. Acute kidney injury on chronic kidney disease stage III. Monitor with IV fluid hydration 3. Left knee and left hip pain. Trial of prednisone to see if there is an inflammatory component to this. Orthopedic  consultation ordered. Physical therapy consultation ordered. 4. Hyperlipidemia unspecified on atorvastatin 5. Hypothyroidism unspecified on levothyroxine 6. Essential hypertension on metoprolol 7. History of COPD on Spiriva 8. Neuropathy on gabapentin 9. History of prostate cancer 10. History of congestive heart failure currently no signs. Monitor with IV fluid hydration  Code Status:     Code Status Orders        Start     Ordered   05/27/17 1923  Full code  Continuous     05/27/17 1923    Code Status History    Date Active Date Inactive Code Status Order ID Comments User Context   12/01/2016  8:42 PM 12/03/2016  4:05 PM Full Code [id number]  Vaughan Basta, MD Inpatient   09/12/2016  8:42 AM 09/13/2016  8:30 PM Full Code [id number]  Himes, Caryn Bee,  RN Inpatient   09/11/2016  8:46 AM 09/11/2016  4:59 PM Full Code [id number]  Dustin Flock, MD ED   08/31/2016  3:33 AM 09/06/2016  5:14 PM Full Code [id number]  Saundra Shelling, MD Inpatient    Advance Directive Documentation     Most Recent Value  Type of Advance Directive  Healthcare Power of Attorney, Living will  Pre-existing out of facility DNR order (yellow form or pink MOST form)  -  "MOST" Form in Place?  -     Family Communication: Spoke with daughter on the phone Disposition Plan: To be determined based on his ability with physical therapy  Consultants:  Orthopedic consultation  Time spent: 28 minutes  [place], [place]

## 2017-05-28 NOTE — Clinical Social Work Note (Signed)
Clinical Social Work Assessment  Patient Details  Name: [name] MRN: [id number] Date of Birth: Jun 15, 1935  Date of referral:  05/28/17               Reason for consult:  Other (Comment Required)                Permission sought to share information with:    Permission granted to share information::     Name::        Agency::     Relationship::     Contact Information:     Housing/Transportation Living arrangements for the past 2 months:  Apartment Source of Information:  Patient Patient Interpreter Needed:  None Criminal Activity/Legal Involvement Pertinent to Current Situation/Hospitalization:  No - Comment as needed Significant Relationships:  Adult Children Lives with:  Self Do you feel safe going back to the place where you live?  Yes Need for family participation in patient care:  Yes (Comment)  Care giving concerns:  Patient lives in an apartment in [place] alone.    Social Worker assessment / plan:  Holiday representative (CSW) received consult at patient's request. CSW met with patient alone at bedside. PT consult is pending. Patient was alert and oriented X3 and was laying in the bed. CSW introduced self and explained role of CSW department. Patient reported that he lives alone in an apartment and has social security income. Patient reported that he is about to get kicked out of his apartment because he has to complete a form. Patient reported that the land lord gave him plenty of time to fill out this form however he did not do it and asked his daughter to help him. Patient could not describe the form and did not know what it was for. Patient reported that he has plenty of food and access to his medications. Patient reported that he doesn't drive and his daughter takes him to appointments. Patient reported that he has 2 supportive daughters. CSW provided patient with a list of [facility] resources. Patient reported that his daughter will help him fill out this form  so he can stay in his apartment. Patient reported no other needs or concerns at this time. CSW will continue to follow and assist as needed.   Employment status:  Retired Forensic scientist:  Medicare PT Recommendations:  Not assessed at this time [place] / Referral to community resources:  Other (Comment Required) [facility]. )  Patient/Family's Response to care:  PT is pending.   Patient/Family's Understanding of and Emotional Response to Diagnosis, Current Treatment, and Prognosis:  Patient was very pleasant and thanked CSW for visit.   Emotional Assessment Appearance:  Appears stated age Attitude/Demeanor/Rapport:    Affect (typically observed):  Accepting, Adaptable, Pleasant Orientation:  Oriented to Self, Oriented to Place, Oriented to  Time Alcohol / Substance use:  Not Applicable Psych involvement (Current and /or in the community):  No (Comment)  Discharge Needs  Concerns to be addressed:  Discharge Planning Concerns Readmission within the last 30 days:  No Current discharge risk:  Lives alone Barriers to Discharge:  Continued Medical Work up   [employer], Veronia Beets, LCSW 05/28/2017, 1:53 PM

## 2017-05-29 ENCOUNTER — Inpatient Hospital Stay: Payer: Medicare Other

## 2017-05-29 LAB — URINALYSIS, ROUTINE W REFLEX MICROSCOPIC
BACTERIA UA: NONE SEEN
Bilirubin Urine: NEGATIVE
Glucose, UA: NEGATIVE mg/dL
Ketones, ur: NEGATIVE mg/dL
NITRITE: POSITIVE — AB
PROTEIN: 100 mg/dL — AB
Specific Gravity, Urine: 1.015 (ref 1.005–1.030)
pH: 6 (ref 5.0–8.0)

## 2017-05-29 LAB — BASIC METABOLIC PANEL
Anion gap: 8 (ref 5–15)
BUN: 46 mg/dL — ABNORMAL HIGH (ref 6–20)
CALCIUM: 8.5 mg/dL — AB (ref 8.9–10.3)
CO2: 24 mmol/L (ref 22–32)
CREATININE: 2.35 mg/dL — AB (ref 0.61–1.24)
Chloride: 102 mmol/L (ref 101–111)
GFR calc non Af Amer: 25 mL/min — ABNORMAL LOW (ref >60)
GFR, EST AFRICAN AMERICAN: 28 mL/min — AB (ref >60)
GLUCOSE: 156 mg/dL — AB (ref 65–99)
Potassium: 4.9 mmol/L (ref 3.5–5.1)
Sodium: 134 mmol/L — ABNORMAL LOW (ref 135–145)

## 2017-05-29 LAB — PROTEIN / CREATININE RATIO, URINE
CREATININE, URINE: 124 mg/dL
PROTEIN CREATININE RATIO: 1.07 mg/mg{creat} — AB (ref 0.00–0.15)
TOTAL PROTEIN, URINE: 133 mg/dL

## 2017-05-29 MED ORDER — NITROGLYCERIN 0.4 MG SL SUBL: 0.4000 mg | SUBLINGUAL_TABLET | SUBLINGUAL | Status: DC | PRN

## 2017-05-29 MED ORDER — ISOSORBIDE MONONITRATE ER 30 MG PO TB24
60.0000 mg | ORAL_TABLET | Freq: Every day | ORAL | Status: DC
  Administered 2017-05-30 – 2017-05-31 (×2): 60 mg via ORAL
  Filled 2017-05-29 (×2): qty 2

## 2017-05-29 MED ORDER — HYDRALAZINE HCL 50 MG PO TABS
50.0000 mg | ORAL_TABLET | Freq: Once | ORAL | Status: AC
  Administered 2017-05-29: 50 mg via ORAL
  Filled 2017-05-29: qty 1

## 2017-05-29 MED ORDER — DEXTROSE 5 % IV SOLN
1.0000 g | INTRAVENOUS | Status: DC
  Administered 2017-05-29 – 2017-05-30 (×2): 1 g via INTRAVENOUS
  Filled 2017-05-29 (×3): qty 10

## 2017-05-29 MED ORDER — HYDRALAZINE HCL 50 MG PO TABS
100.0000 mg | ORAL_TABLET | Freq: Three times a day (TID) | ORAL | Status: DC
  Administered 2017-05-29 – 2017-05-31 (×6): 100 mg via ORAL
  Filled 2017-05-29 (×6): qty 2

## 2017-05-29 MED ORDER — FUROSEMIDE 20 MG PO TABS
20.0000 mg | ORAL_TABLET | Freq: Two times a day (BID) | ORAL | Status: DC
  Administered 2017-05-29 – 2017-05-31 (×5): 20 mg via ORAL
  Filled 2017-05-29 (×5): qty 1

## 2017-05-29 NOTE — Progress Notes (Signed)
Patient BP high through the night with scheduled BP medications. BP 186/77, HR 66 this morning, scheduled hydralazine given. MD notified, verbal to give morning dose of metoprolol now. Continue to monitor.

## 2017-05-29 NOTE — Progress Notes (Signed)
Subjective: The patient notes essentially no change in his symptoms since yesterday. He does not feel that the steroid injection into his left knee performed last evening was of any benefit. He was unable to participate with physical therapy today due to his continued pain. He describes the pain is primarily in the left buttock region and radiating down the lateral aspect of his hip and thigh to the medial aspect of his knee. He also notes occasional extension of his discomfort to the lateral lower leg/ankle region. Again, he denies any numbness or paresthesias to either lower extremity and denies any bowel or bladder complaints.   Objective: Vital signs in last 24 hours: Temp:  [98.4 F (36.9 C)-99.1 F (37.3 C)] 98.4 F (36.9 C) (06/12 1350) Pulse Rate:  [62-85] 62 (06/12 0730) Resp:  [16-20] 16 (06/12 0730) BP: (144-213)/(57-83) 177/58 (06/12 1202) SpO2:  [94 %-96 %] 96 % (06/12 1350)  Intake/Output from previous day: 06/11 0701 - 06/12 0700 In: 2276.8 [P.O.:720; I.V.:1556.8] Out: 1500 [Urine:1500] Intake/Output this shift: Total I/O In: 360 [P.O.:360] Out: 300 [Urine:300]   Recent Labs  05/27/17 1209  HGB 11.5*    Recent Labs  05/27/17 1209  WBC 8.0  RBC 3.99*  HCT 33.8*  PLT 307    Recent Labs  05/28/17 0059 05/29/17 0413  NA 133* 134*  K 5.6* 4.9  CL 101 102  CO2 24 24  BUN 42* 46*  CREATININE 2.05* 2.35*  GLUCOSE 118* 156*  CALCIUM 8.3* 8.5*   No results for input(s): LABPT, INR in the last 72 hours.  Physical Exam: Examination of the patient's lower back shows no skin abnormalities. He has moderate focal tenderness to palpation over the left PSIS region which he states reproduces his buttock pain radiating down to the knee. He also has mild-moderate tenderness to palpation over the lumbar spine itself. He is able to roll from side to side, although with some difficulty and discomfort. Sensation is intact to light touch to all distributions of both lower  extremities. He has 4+/5 strength with resisted ankle dorsiflexion and plantarflexion, as well as with resisted ankle inversion and eversion bilaterally. It is difficult to perform a straight leg raise due to patient apprehension and discomfort.  Examination of his left knee demonstrates mild tenderness to palpation over the medial aspect of the knee and no tenderness laterally. He has no peripatellar tenderness. There is no effusion.  X-rays: X-rays of the left knee were performed last evening. These films demonstrate early degenerative changes but otherwise are unremarkable for fractures, lytic lesions, or more significant degenerative changes. No soft tissue calcifications are identified.  Assessment: Left sided buttock pain radiating into left lower extremity.  Plan: The patient does not feel that the steroid injection into his left knee provided any benefit for his symptoms. Based on his description of symptoms and physical examination findings today, it appears that his symptoms may actually be coming from his back rather than from his hip or knee. Therefore, I feel it would be reasonable to get an MRI scan of his lumbar spine to better evaluate this area for any herniated disks or other reasons for his left radicular symptoms. If the MRI is unremarkable or inconclusive for any significant pathology, then an intra-articular left hip injection performed under fluoroscopy might be of benefit.   [name] 05/29/2017, 3:00 PM

## 2017-05-29 NOTE — Progress Notes (Signed)
PT is recommending SNF. Clinical Social Worker (CSW) met with patient and made him aware of above. Patient is agreeable to SNF search and requested for CSW to call his daughter [name]. CSW contacted patient's daughter [name] and made her aware of above. Daughter is agreeable to SNF search and requested a SNF in [place] close to where she lives. Daughter prefers [facility] or [facility]. FL2 complete and faxed out to [place] and [place]. CSW will continue to follow and assist as needed.   [employer], LCSW [phone]

## 2017-05-29 NOTE — Clinical Social Work Placement (Signed)
   CLINICAL SOCIAL WORK PLACEMENT  NOTE  Date:  05/29/2017  Patient Details  Name: [name] MRN: [id number] Date of Birth: July 06, 1935  Clinical Social Work is seeking post-discharge placement for this patient at the [place] level of care (*CSW will initial, date and re-position this form in  chart as items are completed):  Yes   Patient/family provided with [place] Work Department's list of facilities offering this level of care within the geographic area requested by the patient (or if unable, by the patient's family).  Yes   Patient/family informed of their freedom to choose among providers that offer the needed level of care, that participate in Medicare, Medicaid or managed care program needed by the patient, have an available bed and are willing to accept the patient.  Yes   Patient/family informed of [facility]'s ownership interest in [facility] and [facility], as well as of the fact that they are under no obligation to receive care at these facilities.  PASRR submitted to EDS on       PASRR number received on       Existing PASRR number confirmed on 05/29/17     FL2 transmitted to all facilities in geographic area requested by pt/family on 05/29/17     FL2 transmitted to all facilities within larger geographic area on       Patient informed that his/her managed care company has contracts with or will negotiate with certain facilities, including the following:            Patient/family informed of bed offers received.  Patient chooses bed at       Physician recommends and patient chooses bed at      Patient to be transferred to   on  .  Patient to be transferred to facility by       Patient family notified on   of transfer.  Name of family member notified:        PHYSICIAN       Additional Comment:    _______________________________________________ Vince Ainsley, Veronia Beets, LCSW 05/29/2017, 4:48 PM

## 2017-05-29 NOTE — NC FL2 (Signed)
[place] LEVEL OF CARE SCREENING TOOL     IDENTIFICATION  Patient Name: [name] Birthdate: May 27, 1935 Sex: male Admission Date (Current Location): 05/27/2017  [place] and [place] Number:  Engineering geologist and Address:  [facility], [address], [place],  [address]      Provider Number: [id number]  Attending Physician Name and Address:  Loletha Grayer, MD  Relative Name and Phone Number:       Current Level of Care: Hospital Recommended Level of Care: [place] Prior Approval Number:    Date Approved/Denied:   PASRR Number:  ([id number] )  Discharge Plan: SNF    Current Diagnoses: Patient Active Problem List   Diagnosis Date Noted  . Hyperkalemia 05/28/2017  . Syncope 05/27/2017  . Meningioma ([place]) 01/05/2017  . GI bleed 12/01/2016  . Alcohol abuse 10/30/2016  . Chronic systolic heart failure ([place]) 10/04/2016  . COPD (chronic obstructive pulmonary disease) with chronic bronchitis ([place]) 10/04/2016  . Obstructive sleep apnea 10/04/2016  . Hyponatremia 08/31/2016  . UTI (lower urinary tract infection) 08/31/2016  . Exertional dyspnea 02/11/2016  . Hyperlipidemia 12/03/2015  . Essential hypertension   . Coronary artery disease 01/19/2004    Orientation RESPIRATION BLADDER Height & Weight     Self, Time, Place  Normal Continent Weight: 256 lb (116.1 kg) Height:  5\' 7"  (170.2 cm)  BEHAVIORAL SYMPTOMS/MOOD NEUROLOGICAL BOWEL NUTRITION STATUS   (none)  (none) Continent Diet (Diet: 2 Grams Sodium. )  AMBULATORY STATUS COMMUNICATION OF NEEDS Skin   Extensive Assist Verbally Normal                       Personal Care Assistance Level of Assistance  Bathing, Feeding, Dressing Bathing Assistance: Limited assistance Feeding assistance: Independent Dressing Assistance: Limited assistance     Functional Limitations Info  Sight, Hearing, Speech Sight Info: Adequate Hearing Info:  Adequate Speech Info: Adequate    SPECIAL CARE FACTORS FREQUENCY  PT (By licensed PT), OT (By licensed OT)     PT Frequency:  (5) OT Frequency:  (5)            Contractures      Additional Factors Info  Code Status, Allergies Code Status Info:  (Full Code. ) Allergies Info:  (Amlodipine, Benazepril-hydrochlorothiazide, Chlorthalidone, Other)           Current Medications (05/29/2017):  This is the current hospital active medication list Current Facility-Administered Medications  Medication Dose Route Frequency Provider Last Rate Last Dose  . albuterol (PROVENTIL) (2.5 MG/3ML) 0.083% nebulizer solution 3 mL  3 mL Inhalation Q6H PRN Baxter Hire, MD      . aspirin EC tablet 81 mg  81 mg Oral Charolett Bumpers, MD   81 mg at 05/29/17 0603  . atorvastatin (LIPITOR) tablet 40 mg  40 mg Oral Daily Baxter Hire, MD   40 mg at 05/29/17 0929  . famotidine (PEPCID) tablet 20 mg  20 mg Oral Daily Baxter Hire, MD   20 mg at 05/29/17 0929  . furosemide (LASIX) tablet 20 mg  20 mg Oral BID Lavonia Dana, MD   20 mg at 05/29/17 1203  . gabapentin (NEURONTIN) capsule 300 mg  300 mg Oral TID Baxter Hire, MD   300 mg at 05/29/17 1608  . heparin injection 5,000 Units  5,000 Units Subcutaneous Q8H Baxter Hire, MD   5,000 Units at 05/29/17 1354  . hydrALAZINE (  APRESOLINE) tablet 100 mg  100 mg Oral Q8H Loletha Grayer, MD   100 mg at 05/29/17 1354  . [START ON 05/30/2017] isosorbide mononitrate (IMDUR) 24 hr tablet 60 mg  60 mg Oral Daily Wieting, Richard, MD      . levothyroxine (SYNTHROID, LEVOTHROID) tablet 50 mcg  50 mcg Oral QAC breakfast Baxter Hire, MD   50 mcg at 05/29/17 [id number]  . magnesium oxide (MAG-OX) tablet 400 mg  400 mg Oral BID Baxter Hire, MD   400 mg at 05/29/17 0929  . metoprolol tartrate (LOPRESSOR) tablet 50 mg  50 mg Oral BID Baxter Hire, MD   50 mg at 05/29/17 0618  . mometasone-formoterol (DULERA) 200-5 MCG/ACT inhaler 2 puff  2  puff Inhalation BID Baxter Hire, MD   2 puff at 05/29/17 1000  . nitroGLYCERIN (NITROSTAT) SL tablet 0.4 mg  0.4 mg Sublingual Q5 min PRN Loletha Grayer, MD      . ondansetron [facility]) injection 4 mg  4 mg Intravenous Q6H PRN Baxter Hire, MD      . oxyCODONE (Oxy IR/ROXICODONE) immediate release tablet 5 mg  5 mg Oral Q4H PRN Baxter Hire, MD   5 mg at 05/29/17 1608  . predniSONE (DELTASONE) tablet 20 mg  20 mg Oral Q breakfast Loletha Grayer, MD   20 mg at 05/29/17 0738  . tiotropium (SPIRIVA) inhalation capsule 18 mcg  1 capsule Inhalation Daily Baxter Hire, MD   18 mcg at 05/29/17 1000  . vitamin B-6 (pyridOXINE) tablet 25 mg  25 mg Oral Daily Baxter Hire, MD   25 mg at 05/29/17 1000     Discharge Medications: Please see discharge summary for a list of discharge medications.  Relevant Imaging Results:  Relevant Lab Results:   Additional Information  (SSN: [id number])  Remie Mathison, Veronia Beets, LCSW

## 2017-05-29 NOTE — Progress Notes (Signed)
Pt up with PT to side of bed for brief amount of time and assisted back to bed. Orthostatics obtained while pt was up. Upon assistance back to bed, pt resting comfortably SF. Pt repted midsternal chest pressure/pain. No other associated s/sx of pain or discomfort. Rept to Dr. Leslye Peer. Per his order, administer nitroglycerin. RN in room to notify pt of MD's order. Pt now denies chest pain or discomfort. Pt does rept h/o "this" chest pain/discomfort intermittently in the past and he "takes nitroglycerin" for this chest pain/discomfort. Pt made aware that nitroglycerin has been ordered and is available to him if s/sx reoccur. Pt instructed to notify RN or staff is chest discomfort or pain returns. Pt verb understanding and agrees to comply. Will continue to monitor.

## 2017-05-29 NOTE — Progress Notes (Signed)
Patient ID: [name], male   DOB: [birth year], 82 y.o.   MRN: [id number]   [facility] PROGRESS NOTE  AARON BOSTWICK NWG:[id number] DOB: 09/20/1935 DOA: 05/27/2017 PCP: Marguerita Merles, MD  HPI/Subjective: Patient in severe pain when I palpate around his knee. Patient severe pain when I palpate around his hip. Patient in severe pain when I palpate around his lower back. Patient having difficulty moving his left leg.  Objective: Vitals:   05/29/17 1202 05/29/17 1350  BP: (!) 177/58   Pulse:    Resp:    Temp:  98.4 F (36.9 C)    Filed Weights   05/27/17 1204  Weight: 116.1 kg (256 lb)    ROS: Review of Systems  Constitutional: Negative for chills and fever.  Eyes: Negative for blurred vision.  Respiratory: Negative for cough and shortness of breath.   Cardiovascular: Negative for chest pain.  Gastrointestinal: Negative for abdominal pain, constipation, diarrhea, nausea and vomiting.  Genitourinary: Negative for dysuria.  Musculoskeletal: Positive for back pain and joint pain.  Neurological: Negative for dizziness and headaches.   Exam: Physical Exam  Constitutional: He is oriented to person, place, and time.  HENT:  Nose: No mucosal edema.  Mouth/Throat: No oropharyngeal exudate or posterior oropharyngeal edema.  Eyes: Conjunctivae, EOM and lids are normal. Pupils are equal, round, and reactive to light.  Neck: No JVD present. Carotid bruit is not present. No edema present. No thyroid mass and no thyromegaly present.  Cardiovascular: S1 normal and S2 normal.  Exam reveals no gallop.   No murmur heard. Pulses:      Dorsalis pedis pulses are 2+ on the right side, and 2+ on the left side.  Respiratory: No respiratory distress. He has no wheezes. He has no rhonchi. He has no rales.  GI: Soft. Bowel sounds are normal. He exhibits distension. There is no tenderness.  Musculoskeletal:       Left hip: He exhibits decreased range of motion and tenderness.       Left  knee: He exhibits decreased range of motion. Tenderness found.  Lymphadenopathy:    He has no cervical adenopathy.  Neurological: He is alert and oriented to person, place, and time. No cranial nerve deficit.  Patient having trouble with straight leg raise. Just barely able to get his left leg up off the bed.  Skin: Skin is warm. No rash noted. Nails show no clubbing.  Psychiatric: He has a normal mood and affect.      Data Reviewed: Basic Metabolic Panel:  Recent Labs Lab 05/27/17 1209 05/28/17 0059 05/29/17 0413  NA 136 133* 134*  K 4.8 5.6* 4.9  CL 103 101 102  CO2 20* 24 24  GLUCOSE 85 118* 156*  BUN 37* 42* 46*  CREATININE 1.71* 2.05* 2.35*  CALCIUM 8.8* 8.3* 8.5*   CBC:  Recent Labs Lab 05/27/17 1209  WBC 8.0  HGB 11.5*  HCT 33.8*  MCV 84.8  PLT 307   Cardiac Enzymes:  Recent Labs Lab 05/27/17 1209 05/27/17 1939 05/28/17 0059 05/28/17 0747  TROPONINI <0.03 <0.03 <0.03 <0.03   BNP (last 3 results)  Recent Labs  05/27/17 1209  BNP 42.0     CBG:  Recent Labs Lab 05/27/17 1445  GLUCAP 86      Studies: US Venous Img Lower Unilateral Left  Result Date: 05/29/2017 CLINICAL DATA:  Pain for 6 weeks in the left leg. EXAM: LEFT LOWER EXTREMITY VENOUS DOPPLER ULTRASOUND TECHNIQUE: Gray-scale sonography with graded compression,  as well as color Doppler and duplex ultrasound were performed to evaluate the lower extremity deep venous systems from the level of the common femoral vein and including the common femoral, femoral, profunda femoral, popliteal and calf veins including the posterior tibial, peroneal and gastrocnemius veins when visible. The superficial great saphenous vein was also interrogated. Spectral Doppler was utilized to evaluate flow at rest and with distal augmentation maneuvers in the common femoral, femoral and popliteal veins. COMPARISON:  None. FINDINGS: Contralateral Common Femoral Vein: Respiratory phasicity is normal and symmetric  with the symptomatic side. No evidence of thrombus. Normal compressibility. Common Femoral Vein: No evidence of thrombus. Saphenofemoral Junction: No evidence of thrombus. Profunda Femoral Vein: No evidence of thrombus. Femoral Vein: No evidence of thrombus. Popliteal Vein: No evidence of thrombus. Calf Veins: No evidence of thrombus. IMPRESSION: No evidence of DVT within the left lower extremity. Electronically Signed   By: Monte Fantasia M.D.   On: 05/29/2017 10:19   Dg Knee Complete 4 Views Left  Result Date: 05/28/2017 CLINICAL DATA:  Left knee pain.  No injury. EXAM: LEFT KNEE - COMPLETE 4+ VIEW COMPARISON:  None. FINDINGS: There is mild tricompartmental osteoarthritic change most prominent over the patellofemoral joint. No significant joint effusion. No evidence of fracture or dislocation. IMPRESSION: No acute findings per Mild tricompartmental osteoarthritis. Electronically Signed   By: Marin Olp M.D.   On: 05/28/2017 19:19   Dg Hip Unilat W Or W/o Pelvis 2-3 Views Left  Result Date: 05/28/2017 CLINICAL DATA:  Pain EXAM: DG HIP (WITH OR WITHOUT PELVIS) 2-3V LEFT COMPARISON:  CT left hip May 15, 2017 FINDINGS: Frontal pelvis as well as frontal and lateral left hip images were obtained. There is a total hip replacement on the right with prosthetic components well-seated. On the left, there is plate and screw fixation for a previous fracture. The proximal aspect of the screw is in the proximal femoral head along the inferior aspect. There is no acute fracture or dislocation. There is moderate narrowing of the left hip joint. No erosive change. There is a penile prosthesis. There is a stent in the right superficial femoral artery. There is iliac artery atherosclerosis. IMPRESSION: Screw and plate fixation on the left with no acute fracture or dislocation. Moderate narrowing left hip joint. Total hip replacement right with prosthetic components well-seated. Penile prosthesis present. No acute fracture  or dislocation. Bilateral iliac artery atherosclerosis. Stent in right common femoral artery. Electronically Signed   By: Lowella Grip III M.D.   On: 05/28/2017 11:10    Scheduled Meds: . aspirin EC  81 mg Oral [id number]  . atorvastatin  40 mg Oral Daily  . famotidine  20 mg Oral Daily  . furosemide  20 mg Oral BID  . gabapentin  300 mg Oral TID  . heparin  5,000 Units Subcutaneous Q8H  . hydrALAZINE  100 mg Oral Q8H  . [START ON 05/30/2017] isosorbide mononitrate  60 mg Oral Daily  . levothyroxine  50 mcg Oral QAC breakfast  . magnesium oxide  400 mg Oral BID  . metoprolol tartrate  50 mg Oral BID  . mometasone-formoterol  2 puff Inhalation BID  . predniSONE  20 mg Oral Q breakfast  . tiotropium  1 capsule Inhalation Daily  . vitamin B-6  25 mg Oral Daily      Assessment/Plan:  1. Left knee and left hip pain. Also some pain in the lower back. No response to injection of his left knee yesterday. MRI of  the back ordered by orthopedic surgery. Ultrasound of the left leg negative for DVT.  Likely will end up needing rehabilitation. 2. Acute kidney injury on chronic kidney disease stage III. Appreciate nephrology consultation. Restarted Lasix. IV fluids discontinued. 3. Hyperkalemia. This has improved with one dose of Kayexalate and IV fluids. Continue to hold losartan. 4. Chest pain after working with physical therapy. Cardiac enzymes were negative. Trial of nitroglycerin. Increase Imdur 5. Hyperlipidemia unspecified on atorvastatin 6. Hypothyroidism unspecified on levothyroxine 7. Essential hypertension on metoprolol, hydralazine and imdur 8. History of COPD on Spiriva 9. Neuropathy on gabapentin 10. History of prostate cancer 11. History of congestive heart failure currently no signs. Monitor with IV fluid hydration  Code Status:     Code Status Orders        Start     Ordered   05/27/17 1923  Full code  Continuous     05/27/17 1923    Code Status History    Date  Active Date Inactive Code Status Order ID Comments User Context   12/01/2016  8:42 PM 12/03/2016  4:05 PM Full Code [id number]  Vaughan Basta, MD Inpatient   09/12/2016  8:42 AM 09/13/2016  8:30 PM Full Code [id number]  Alonna Buckler, RN Inpatient   09/11/2016  8:46 AM 09/11/2016  4:59 PM Full Code [id number]  Dustin Flock, MD ED   08/31/2016  3:33 AM 09/06/2016  5:14 PM Full Code [id number]  Saundra Shelling, MD Inpatient    Advance Directive Documentation     Most Recent Value  Type of Advance Directive  Healthcare Power of Attorney, Living will  Pre-existing out of facility DNR order (yellow form or pink MOST form)  -  "MOST" Form in Place?  -     Family Communication: Spoke with daughter on the phone Disposition Plan: Likely will need rehabilitation  Consultants:  Orthopedic surgery  Nephrology  Time spent: 26 minutes  Loletha Grayer  [employer]

## 2017-05-29 NOTE — Progress Notes (Signed)
PT Cancellation Note  Patient Details Name: [name] MRN: [id number] DOB: 27-Nov-1935   Cancelled Treatment:    Reason Eval/Treat Not Completed: Other (comment). Chart reviewed. PT holding evaluation this AM due to pending DVT testing. Will re-attempt evaluation this afternoon if medically cleared.   Donaciano Eva, PT, SPT  [place] 05/29/2017, 8:20 AM

## 2017-05-29 NOTE — Consult Note (Signed)
[facility]  CONSULT NOTE    Date: 05/29/2017                  Patient Name:  [name]  MRN: [id number]  DOB: [birth year]  Age / Sex: 82 y.o., male         PCP: Marguerita Merles, MD                 Service Requesting Consult: Dr. Leslye Peer                 Reason for Consult: Acute renal failure on chronic kidney disease stage IV            History of Present Illness: [name] is a 82 y.o. black male with COPD, hypertension, hyperlipidemia, congestive heart failure, coronary artery disease, bipolar disorder,  who was admitted to [facility] on 05/27/2017 for short of breath.   Patient with creatinine of 2.35 from basline creatinine of 2.16, GFR of 36 on 2/21. Patient follows with [facility].   Patient does not check his blood pressure at home. He was taken off  Losartan as outpatient due to hyperkalemia but seems that he continues to take this.   Patient endorses use of over the counter nonsteroidal anti-inflammatory: Advil.    Medications: Outpatient medications: Prescriptions Prior to Admission  Medication Sig Dispense Refill Last Dose  . albuterol (PROAIR HFA) 108 (90 Base) MCG/ACT inhaler Inhale 2 puffs into the lungs every 6 (six) hours as needed for wheezing or shortness of breath. Reported on 04/21/2016   prn at prn  . aspirin EC 81 MG tablet Take 81 mg by mouth every morning.   Past Week at Unknown time  . atorvastatin (LIPITOR) 40 MG tablet Take 40 mg by mouth daily.   Past Week at Unknown time  . fluticasone-salmeterol (ADVAIR HFA) 115-21 MCG/ACT inhaler Inhale 2 puffs into the lungs 2 (two) times daily.   Past Week at Unknown time  . furosemide (LASIX) 40 MG tablet Take 0.5 tablets (20 mg total) by mouth 2 (two) times daily. 60 tablet 0 Past Week at Unknown time  . gabapentin (NEURONTIN) 300 MG capsule Take 100 mg by mouth 3 (three) times daily.    Past Week at unknown  . hydrALAZINE (APRESOLINE) 25 MG tablet Take 2 tablets (50 mg total) by  mouth every 8 (eight) hours. 180 tablet 3 Past Week at Unknown time  . HYDROcodone-acetaminophen (NORCO/VICODIN) 5-325 MG tablet Take 1 tablet by mouth every 6 (six) hours as needed for moderate pain.   unknown at unknown  . isosorbide mononitrate (IMDUR) 30 MG 24 hr tablet Take 1 tablet (30 mg total) by mouth daily. 30 tablet 5 Past Week at Unknown time  . ketoconazole (NIZORAL) 2 % shampoo Apply 1 application topically 3 (three) times a week.    Past Week at Unknown time  . levothyroxine (SYNTHROID, LEVOTHROID) 50 MCG tablet Take 50 mcg by mouth daily before breakfast.   Past Week at Unknown time  . magnesium oxide (MAG-OX) 400 MG tablet Take 400 mg by mouth 2 (two) times daily.    Past Week at Unknown time  . metoprolol (LOPRESSOR) 50 MG tablet Take 1 tablet (50 mg total) by mouth 2 (two) times daily. 60 tablet 0 Past Week at Unknown time  . oxycodone (OXY-IR) 5 MG capsule Take 5 mg by mouth every 4 (four) hours as needed.   unknown at unknown  . vitamin B-6 (PYRIDOXINE)  25 MG tablet Take 25 mg by mouth daily.   Past Week at Unknown time  . famotidine (PEPCID) 20 MG tablet Take 1 tablet (20 mg total) by mouth daily. (Patient not taking: Reported on 05/27/2017) 30 tablet 0 Completed Course at Unknown time  . losartan (COZAAR) 25 MG tablet Take 25 mg by mouth daily.   unknown at unknown  . nitroGLYCERIN (NITROSTAT) 0.4 MG SL tablet Take 0.4 mg by mouth every 5 (five) minutes x 3 doses as needed for chest pain. As needed for chest pain   prn at prn  . ondansetron (ZOFRAN) 4 MG tablet Take 1 tablet (4 mg total) by mouth every 8 (eight) hours as needed for nausea or vomiting. 20 tablet 0 prn at prn  . Tiotropium Bromide Monohydrate (SPIRIVA RESPIMAT) 1.25 MCG/ACT AERS Inhale 1 puff into the lungs daily.   Not Taking at Unknown time    Current medications: Current Facility-Administered Medications  Medication Dose Route Frequency Provider Last Rate Last Dose  . albuterol (PROVENTIL) (2.5 MG/3ML)  0.083% nebulizer solution 3 mL  3 mL Inhalation Q6H PRN Baxter Hire, MD      . aspirin EC tablet 81 mg  81 mg Oral Charolett Bumpers, MD   81 mg at 05/29/17 0603  . atorvastatin (LIPITOR) tablet 40 mg  40 mg Oral Daily Baxter Hire, MD   40 mg at 05/29/17 3976  . famotidine (PEPCID) tablet 20 mg  20 mg Oral Daily Baxter Hire, MD   20 mg at 05/29/17 7341  . furosemide (LASIX) tablet 20 mg  20 mg Oral BID Mayuri Staples, MD      . gabapentin (NEURONTIN) capsule 300 mg  300 mg Oral TID Baxter Hire, MD   300 mg at 05/29/17 0929  . heparin injection 5,000 Units  5,000 Units Subcutaneous Q8H Baxter Hire, MD   5,000 Units at 05/29/17 0603  . hydrALAZINE (APRESOLINE) tablet 100 mg  100 mg Oral Q8H Wieting, Richard, MD      . isosorbide mononitrate (IMDUR) 24 hr tablet 30 mg  30 mg Oral Daily Baxter Hire, MD   30 mg at 05/29/17 0929  . levothyroxine (SYNTHROID, LEVOTHROID) tablet 50 mcg  50 mcg Oral QAC breakfast Baxter Hire, MD   50 mcg at 05/29/17 [phone]  . magnesium oxide (MAG-OX) tablet 400 mg  400 mg Oral BID Baxter Hire, MD   400 mg at 05/29/17 0929  . metoprolol tartrate (LOPRESSOR) tablet 50 mg  50 mg Oral BID Baxter Hire, MD   50 mg at 05/29/17 0618  . mometasone-formoterol (DULERA) 200-5 MCG/ACT inhaler 2 puff  2 puff Inhalation BID Baxter Hire, MD   2 puff at 05/29/17 1000  . ondansetron (ZOFRAN) injection 4 mg  4 mg Intravenous Q6H PRN Baxter Hire, MD      . oxyCODONE (Oxy IR/ROXICODONE) immediate release tablet 5 mg  5 mg Oral Q4H PRN Baxter Hire, MD   5 mg at 05/29/17 0741  . predniSONE (DELTASONE) tablet 20 mg  20 mg Oral Q breakfast Loletha Grayer, MD   20 mg at 05/29/17 0738  . tiotropium (SPIRIVA) inhalation capsule 18 mcg  1 capsule Inhalation Daily Baxter Hire, MD   18 mcg at 05/29/17 1000  . vitamin B-6 (pyridOXINE) tablet 25 mg  25 mg Oral Daily Baxter Hire, MD   25 mg at 05/29/17 1000       Allergies:  Allergies  Allergen Reactions  . Amlodipine     Constipation   . Benazepril-Hydrochlorothiazide     Constipation   . Chlorthalidone     Hyponatremia  . Other Other (See Comments)    "ANY BLOOD PRESSURE MEDICATIONS THAT I'VE TRIED" - PT. DOES NOT REMEMBER WHICH ONES      Past Medical History: Past Medical History:  Diagnosis Date  . Alcoholism ([place])   . Asthma   . Bipolar affective disorder ([place])   . CHF (congestive heart failure) ([place])   . COPD (chronic obstructive pulmonary disease) ([facility])    COPD  . Coronary artery disease February 2005   PCI and Taxus drug-eluting stent placement to the distal RCA (3.5 x 12 mm)  . Degenerative joint disease   . Essential hypertension   . Hyperlipidemia   . Hypertension   . Prostate CA [facility])    prostate ca dx 49 ys ago;  . Prostate CA [facility])    prostate ca dx 20 yrs ago  . PVD (peripheral vascular disease) ([place])   . Tobacco abuse      Past Surgical History: Past Surgical History:  Procedure Laterality Date  . CARDIAC CATHETERIZATION    . CORONARY ANGIOPLASTY WITH STENT PLACEMENT    . TOTAL HIP ARTHROPLASTY     right     Family History: Family History  Problem Relation Age of Onset  . Hypertension Mother   . Hyperlipidemia Mother   . Heart attack Mother   . Hypertension Father   . Hyperlipidemia Father   . Heart attack Father      Social History: Social History   Social History  . Marital status: Legally Separated    Spouse name: N/A  . Number of children: N/A  . Years of education: N/A   Occupational History  . retired    Social History Main Topics  . Smoking status: Former Smoker    Packs/day: 1.00    Years: 50.00    Types: Cigarettes  . Smokeless tobacco: Never Used  . Alcohol use Yes     Comment: social   . Drug use: No  . Sexual activity: Not on file   Other Topics Concern  . Not on file   Social History Narrative  . No narrative on file     Review of Systems: Review of  Systems  Constitutional: Positive for diaphoresis. Negative for chills, fever, malaise/fatigue and weight loss.  HENT: Negative.  Negative for congestion, ear discharge, ear pain, hearing loss, nosebleeds, sinus pain, sore throat and tinnitus.   Eyes: Negative.  Negative for blurred vision, double vision, photophobia, pain, discharge and redness.  Respiratory: Negative.  Negative for cough, hemoptysis, sputum production, shortness of breath, wheezing and stridor.   Cardiovascular: Negative.  Negative for chest pain, palpitations, orthopnea, claudication, leg swelling and PND.  Gastrointestinal: Negative.  Negative for abdominal pain, blood in stool, constipation, diarrhea, heartburn, melena, nausea and vomiting.  Genitourinary: Negative.  Negative for dysuria, flank pain, frequency, hematuria and urgency.  Musculoskeletal: Positive for joint pain. Negative for back pain, falls, myalgias and neck pain.  Skin: Negative.  Negative for itching and rash.  Neurological: Positive for weakness. Negative for dizziness, tingling, tremors, sensory change, speech change, focal weakness, seizures, loss of consciousness and headaches.  Endo/Heme/Allergies: Negative.  Negative for environmental allergies and polydipsia. Does not bruise/bleed easily.  Psychiatric/Behavioral: Negative.  Negative for depression, hallucinations, memory loss, substance abuse and suicidal ideas. The patient is not nervous/anxious and does not have  insomnia.     Vital Signs: Blood pressure (!) 158/57, pulse 62, temperature 99.1 F (37.3 C), temperature source Oral, resp. rate 16, height 5\' 7"  (1.702 m), weight 116.1 kg (256 lb), SpO2 94 %.  Weight trends: Filed Weights   05/27/17 1204  Weight: 116.1 kg (256 lb)    Physical Exam: General: NAD, laying in bed  Head: Normocephalic, atraumatic. Moist oral mucosal membranes  Eyes: Anicteric, PERRL  Neck: Supple, trachea midline  Lungs:  Clear to auscultation  Heart: Regular rate  and rhythm  Abdomen:  Soft, nontender, obese  Extremities:  no peripheral edema.  Neurologic: Nonfocal, moving all four extremities, flat affect  Skin: No lesions        Lab results: Basic Metabolic Panel:  Recent Labs Lab 05/27/17 1209 05/28/17 0059 05/29/17 0413  NA 136 133* 134*  K 4.8 5.6* 4.9  CL 103 101 102  CO2 20* 24 24  GLUCOSE 85 118* 156*  BUN 37* 42* 46*  CREATININE 1.71* 2.05* 2.35*  CALCIUM 8.8* 8.3* 8.5*    Liver Function Tests: No results for input(s): AST, ALT, ALKPHOS, BILITOT, PROT, ALBUMIN in the last 168 hours. No results for input(s): LIPASE, AMYLASE in the last 168 hours. No results for input(s): AMMONIA in the last 168 hours.  CBC:  Recent Labs Lab 05/27/17 1209  WBC 8.0  HGB 11.5*  HCT 33.8*  MCV 84.8  PLT 307    Cardiac Enzymes:  Recent Labs Lab 05/27/17 1209 05/27/17 1939 05/28/17 0059 05/28/17 0747  TROPONINI <0.03 <0.03 <0.03 <0.03    BNP: Invalid input(s): POCBNP  CBG:  Recent Labs Lab 05/27/17 1445  GLUCAP 86    Microbiology: Results for orders placed or performed during the hospital encounter of 08/31/16  Urine culture     Status: None   Collection Time: 08/30/16 10:49 PM  Result Value Ref Range Status   Specimen Description URINE, RANDOM  Final   Special Requests Normal  Final   Culture NO GROWTH Performed at [facility]   Final   Report Status 09/01/2016 FINAL  Final  Culture, blood (routine x 2)     Status: None   Collection Time: 08/31/16 12:20 AM  Result Value Ref Range Status   Specimen Description BLOOD  RIGHT AC  Final   Special Requests   Final    BOTTLES DRAWN AEROBIC AND ANAEROBIC  ANA 7ML AER 9ML   Culture NO GROWTH 5 DAYS  Final   Report Status 09/05/2016 FINAL  Final  Culture, blood (routine x 2)     Status: None   Collection Time: 08/31/16 12:30 AM  Result Value Ref Range Status   Specimen Description BLOOD  RIGHT HAND   Final   Special Requests   Final    BOTTLES DRAWN  AEROBIC AND ANAEROBIC  ANA 8ML AER 8ML   Culture NO GROWTH 5 DAYS  Final   Report Status 09/05/2016 FINAL  Final    Coagulation Studies: No results for input(s): LABPROT, INR in the last 72 hours.  Urinalysis: No results for input(s): COLORURINE, LABSPEC, PHURINE, GLUCOSEU, HGBUR, BILIRUBINUR, KETONESUR, PROTEINUR, UROBILINOGEN, NITRITE, LEUKOCYTESUR in the last 72 hours.  Invalid input(s): APPERANCEUR    Imaging: Dg Chest 2 View  Result Date: 05/27/2017 CLINICAL DATA:  Shortness of Breath EXAM: CHEST  2 VIEW COMPARISON:  September 11, 2016 FINDINGS: There is no appreciable edema or consolidation. There is apparent epicardial fat prominence anteriorly and inferiorly. Heart size and pulmonary vascularity are normal. No  adenopathy. There is aortic atherosclerosis. There is degenerative change in the thoracic spine. IMPRESSION: No edema or consolidation. There is aortic atherosclerosis. No evident adenopathy. Electronically Signed   By: Lowella Grip III M.D.   On: 05/27/2017 13:05   US Venous Img Lower Unilateral Left  Result Date: 05/29/2017 CLINICAL DATA:  Pain for 6 weeks in the left leg. EXAM: LEFT LOWER EXTREMITY VENOUS DOPPLER ULTRASOUND TECHNIQUE: Gray-scale sonography with graded compression, as well as color Doppler and duplex ultrasound were performed to evaluate the lower extremity deep venous systems from the level of the common femoral vein and including the common femoral, femoral, profunda femoral, popliteal and calf veins including the posterior tibial, peroneal and gastrocnemius veins when visible. The superficial great saphenous vein was also interrogated. Spectral Doppler was utilized to evaluate flow at rest and with distal augmentation maneuvers in the common femoral, femoral and popliteal veins. COMPARISON:  None. FINDINGS: Contralateral Common Femoral Vein: Respiratory phasicity is normal and symmetric with the symptomatic side. No evidence of thrombus. Normal  compressibility. Common Femoral Vein: No evidence of thrombus. Saphenofemoral Junction: No evidence of thrombus. Profunda Femoral Vein: No evidence of thrombus. Femoral Vein: No evidence of thrombus. Popliteal Vein: No evidence of thrombus. Calf Veins: No evidence of thrombus. IMPRESSION: No evidence of DVT within the left lower extremity. Electronically Signed   By: Monte Fantasia M.D.   On: 05/29/2017 10:19   Dg Knee Complete 4 Views Left  Result Date: 05/28/2017 CLINICAL DATA:  Left knee pain.  No injury. EXAM: LEFT KNEE - COMPLETE 4+ VIEW COMPARISON:  None. FINDINGS: There is mild tricompartmental osteoarthritic change most prominent over the patellofemoral joint. No significant joint effusion. No evidence of fracture or dislocation. IMPRESSION: No acute findings per Mild tricompartmental osteoarthritis. Electronically Signed   By: Marin Olp M.D.   On: 05/28/2017 19:19   Dg Hip Unilat W Or W/o Pelvis 2-3 Views Left  Result Date: 05/28/2017 CLINICAL DATA:  Pain EXAM: DG HIP (WITH OR WITHOUT PELVIS) 2-3V LEFT COMPARISON:  CT left hip May 15, 2017 FINDINGS: Frontal pelvis as well as frontal and lateral left hip images were obtained. There is a total hip replacement on the right with prosthetic components well-seated. On the left, there is plate and screw fixation for a previous fracture. The proximal aspect of the screw is in the proximal femoral head along the inferior aspect. There is no acute fracture or dislocation. There is moderate narrowing of the left hip joint. No erosive change. There is a penile prosthesis. There is a stent in the right superficial femoral artery. There is iliac artery atherosclerosis. IMPRESSION: Screw and plate fixation on the left with no acute fracture or dislocation. Moderate narrowing left hip joint. Total hip replacement right with prosthetic components well-seated. Penile prosthesis present. No acute fracture or dislocation. Bilateral iliac artery atherosclerosis.  Stent in right common femoral artery. Electronically Signed   By: Lowella Grip III M.D.   On: 05/28/2017 11:10      Assessment & Plan: [name] is a 82 y.o. black male with COPD, hypertension, hyperlipidemia, congestive heart failure, coronary artery disease, bipolar disorder,  who was admitted to [facility] on 05/27/2017 for short of breath.   1. Acute renal failure on chronic kidney disease stage III with hyperkalemia and proteinuria. Baseline Creatinine 2.16, GFR of 36 on 2/21. Followed by [facility].  Chronic kidney disease secondary to hypertension and Lithium exposure Creatinine on admission of 1.71 and jumped to 2.35.  -  Discontinue IV fluids - restart furosemide - discontinue losartan due to hyperkalemia - Renally dose all medications. Avoid NSAIDs.  - check urine studies  2. Hypertension: not well controlled - Home regimen of furosemide, hydralazine, isosorbide mononitrate, metoprolol and low dose losartan - Discontinue losartan due to hyperkalemia - Restart furosemide.  - If more blood pressure control required, will consider amlodipine.   LOS: [place], [place] 6/12/201811:39 AM

## 2017-05-29 NOTE — Progress Notes (Signed)
Rept to Dr. Leslye Peer regarding pt's BP. MD in room seeing pt. Orders received. Will continue to monitor.

## 2017-05-29 NOTE — Evaluation (Signed)
Physical Therapy Evaluation Patient Details Name: [name] MRN: [id number] DOB: Feb 03, 1935 Today's Date: 05/29/2017   History of Present Illness  82 y/o male admitted to ED on 05/27/2017 with L hip and knee pain. No trauma or falls reported. He was seen at [facility] about 1 month ago for this complaint, and imaging was negative for fractures. Pt came to ED due to his pain intensifying. Upon initial evaluation at the hospital, pt had a brief episode of syncope. Pt's potassium was found to be severely elevated. Imaging repeated in hospital and was negative and his L THA was still intact. PMH includes PAD, COPD, alcoholism, bipolar disorder, CHF, HTN, PVD, prostate cancer, and stage III chronic kidney disease.     Clinical Impression  Pt is an 82 year old admitted for L knee/hip pain. Pt performs bed mobility and transfers with min assist from +2 PT's for safety. Pt unable to ambulate this session secondary to pain and apprehension weight bearing through his L LE. Pt's vitals dropped from 193/92 and 113bpm seated to 111/89 and 93bpm standing. RN notified. Pt demonstrates deficits in strength, ROM, and endurance. Pain and apprehension greatly reduced this session. Would benefit from skilled PT to address above deficits and promote optimal return to PLOF. Pt states he appreciates our PT services and would participate more if he was not experiencing this much pain.     Follow Up Recommendations SNF    Equipment Recommendations       Recommendations for Other Services       Precautions / Restrictions Precautions Precautions: Fall Restrictions Weight Bearing Restrictions: No      Mobility  Bed Mobility Overal bed mobility: Needs Assistance Bed Mobility: Supine to Sit;Sit to Supine     Supine to sit: Min assist;+2 for safety/equipment Sit to supine: Min assist;+2 for safety/equipment   General bed mobility comments: Verbal cueing and +2 assist for proper sequencing to ensure pt safety.  Pt denied dizziness at EOB. Pt able to sit EOB for several minutes with both knees flexed to roughly 70deg.  Transfers Overall transfer level: Needs assistance Equipment used: Rolling walker (2 wheeled) Transfers: Sit to/from Stand Sit to Stand: Min assist;+2 physical assistance;+2 safety/equipment         General transfer comment: Verbal cueing for hand placement and to promote weight bearing through R LE versus L LE when standing. Pt able to stand for 61min prior to fatigue and pain limiting progression further.  Ambulation/Gait Ambulation/Gait assistance: Max assist;+2 physical assistance;+2 safety/equipment Ambulation Distance (Feet): 0 Feet Assistive device: Rolling walker (2 wheeled)       General Gait Details: Pt did not ambulate this session due to pain and unsteadiness once standing. Plan to assess next session.  Stairs            Wheelchair Mobility    Modified Rankin (Stroke Patients Only)       Balance Overall balance assessment: Needs assistance Sitting-balance support: Bilateral upper extremity supported;Feet unsupported   Sitting balance - Comments: Pt able to sit EOB for several minutes with no LOB or dizziness noted.   Standing balance support: Bilateral upper extremity supported   Standing balance comment: Pt dizzy once standing. Pt's vitals seated were 193/92 and 113bpm; standing were 111/89 and 93bpm. Pt able to maintain standing position with both hands on the RW and +2 assist for safety/assist. Pt unable to weight shift to L LE due to pain.  Pertinent Vitals/Pain Pain Assessment: 0-10 Pain Score: 10-Worst pain ever (Pt states his pain is an 11/10.) Pain Location: L knee. Pt also complained his R knee hurt during this session. Pain Descriptors / Indicators: Grimacing;Moaning Pain Intervention(s): Limited activity within patient's tolerance;Monitored during session;Premedicated before session;Repositioned     Home Living Family/patient expects to be discharged to: Assisted living (Pt states he does not feel safe to dc home.) Living Arrangements: Alone in an independent living facility.   Type of Home: Apartment Home Access: Ramped entrance     Home Layout: One level Home Equipment: [place] - 2 wheels;Kasandra Knudsen - single point Additional Comments: Pt states his apartment is equipped for seniors, but he doesn't feel safe to go home since he lives alone.    Prior Function Level of Independence: Independent with assistive device(s)         Comments: Pt reports he ambulated within his home with either a cane or RW depending on what device was closest.     Hand Dominance        Extremity/Trunk Assessment   Upper Extremity Assessment Upper Extremity Assessment: Generalized weakness (grossly 4/5 B for shoulder flex, elbow flex/ext, and grip)    Lower Extremity Assessment Lower Extremity Assessment: Generalized weakness;RLE deficits/detail;LLE deficits/detail (grossly 5/5 for B DF/PF and 4/5 for knee flex/ext) RLE Deficits / Details: Pt complained of R knee pain during SLR, heel slide and hip abd/add. No pain reported with DF/PF. RLE: Unable to fully assess due to pain RLE Sensation:  (normal) LLE Deficits / Details: Pt able to achieve 0deg of knee extension. Unable to perform heel slide in bed due to pain, but he achieved roughly 70deg of knee flexion B when seated EOB. LLE: Unable to fully assess due to pain LLE Sensation:  (normal)       Communication   Communication: No difficulties  Cognition Arousal/Alertness: Awake/alert Behavior During Therapy: WFL for tasks assessed/performed Overall Cognitive Status: Within Functional Limits for tasks assessed                                 General Comments: Pt able to follow commands and willing to participate in therapy despite intense pain.      General Comments      Exercises Other Exercises Other Exercises:  Supine ther-ex included: shoulder flex/elbow flex x10 B, SLR x5 B, hip abd/add x3 B, and ankle pumps x10 B.  Other Exercises: Sit to stand x1. Pt able to stand for 33min prior to sitting for rest break due to pain and SOB. SAO2 remained above 94% this session.   Assessment/Plan    PT Assessment Patient needs continued PT services  PT Problem List Decreased strength;Decreased range of motion;Decreased activity tolerance;Decreased mobility;Obesity;Pain       PT Treatment Interventions Gait training;Therapeutic activities;Therapeutic exercise;Balance training;Patient/family education    PT Goals (Current goals can be found in the Care Plan section)  Acute Rehab PT Goals Patient Stated Goal: to hurt less PT Goal Formulation: With patient Time For Goal Achievement: 06/12/17 Potential to Achieve Goals: Good    Frequency Min 2X/week   Barriers to discharge        Co-evaluation               AM-PAC PT "6 Clicks" Daily Activity  Outcome Measure Difficulty turning over in bed (including adjusting bedclothes, sheets and blankets)?: A Lot Difficulty moving from lying on back to sitting on the  side of the bed? : Total Difficulty sitting down on and standing up from a chair with arms (e.g., wheelchair, bedside commode, etc,.)?: Total Help needed moving to and from a bed to chair (including a wheelchair)?: Total Help needed walking in hospital room?: Total Help needed climbing 3-5 steps with a railing? : Total 6 Click Score: 7    End of Session Equipment Utilized During Treatment: Gait belt Activity Tolerance: Patient limited by pain Patient left: in bed;with bed alarm set;with call bell/phone within reach Nurse Communication: Mobility status (orthostatic vitals) PT Visit Diagnosis: Other abnormalities of gait and mobility (R26.89);Muscle weakness (generalized) (M62.81);Pain Pain - Right/Left: Left Pain - part of body: Knee    Time: 0150-0224 PT Time Calculation (min) (ACUTE  ONLY): 34 min   Charges:         PT G Codes:        Donaciano Eva, PT, SPT  Marni Griffon 05/29/2017, 3:17 PM

## 2017-05-30 LAB — BASIC METABOLIC PANEL
ANION GAP: 8 (ref 5–15)
BUN: 52 mg/dL — ABNORMAL HIGH (ref 6–20)
CHLORIDE: 102 mmol/L (ref 101–111)
CO2: 24 mmol/L (ref 22–32)
Calcium: 8.6 mg/dL — ABNORMAL LOW (ref 8.9–10.3)
Creatinine, Ser: 2.01 mg/dL — ABNORMAL HIGH (ref 0.61–1.24)
GFR calc Af Amer: 34 mL/min — ABNORMAL LOW (ref >60)
GFR, EST NON AFRICAN AMERICAN: 30 mL/min — AB (ref >60)
Glucose, Bld: 119 mg/dL — ABNORMAL HIGH (ref 65–99)
POTASSIUM: 4.6 mmol/L (ref 3.5–5.1)
SODIUM: 134 mmol/L — AB (ref 135–145)

## 2017-05-30 LAB — CBC
HEMATOCRIT: 31.7 % — AB (ref 40.0–52.0)
Hemoglobin: 11 g/dL — ABNORMAL LOW (ref 13.0–18.0)
MCH: 29.5 pg (ref 26.0–34.0)
MCHC: 34.8 g/dL (ref 32.0–36.0)
MCV: 84.7 fL (ref 80.0–100.0)
Platelets: 261 10*3/uL (ref 150–440)
RBC: 3.75 MIL/uL — ABNORMAL LOW (ref 4.40–5.90)
RDW: 15 % — AB (ref 11.5–14.5)
WBC: 10.6 10*3/uL (ref 3.8–10.6)

## 2017-05-30 LAB — BLOOD GAS, VENOUS
ACID-BASE DEFICIT: 0.7 mmol/L (ref 0.0–2.0)
Bicarbonate: 25.4 mmol/L (ref 20.0–28.0)
PATIENT TEMPERATURE: 37
pCO2, Ven: 47 mmHg (ref 44.0–60.0)
pH, Ven: 7.34 (ref 7.250–7.430)
pO2, Ven: <31 mmHg — CL (ref 32.0–45.0)

## 2017-05-30 MED ORDER — HYDRALAZINE HCL 20 MG/ML IJ SOLN
2.5000 mg | Freq: Once | INTRAMUSCULAR | Status: AC
  Administered 2017-05-30: 3 mg via INTRAVENOUS
  Filled 2017-05-30: qty 1

## 2017-05-30 MED ORDER — METHYLPREDNISOLONE SODIUM SUCC 40 MG IJ SOLR
40.0000 mg | Freq: Two times a day (BID) | INTRAMUSCULAR | Status: DC
  Administered 2017-05-30 – 2017-05-31 (×3): 40 mg via INTRAVENOUS
  Filled 2017-05-30 (×3): qty 1

## 2017-05-30 MED ORDER — LOSARTAN POTASSIUM 25 MG PO TABS
25.0000 mg | ORAL_TABLET | Freq: Every day | ORAL | Status: DC
  Administered 2017-05-30 – 2017-05-31 (×2): 25 mg via ORAL
  Filled 2017-05-30 (×2): qty 1

## 2017-05-30 NOTE — Progress Notes (Signed)
Subjective: The patient notes little change in his symptoms today as compared to yesterday. He continues to complain of left-sided buttock pain radiating down the lateral aspect of his left hip and thigh to his left knee which is aggravated by movement, either shifting in bed or with attempted ambulation. He does note that his physical therapy session today went better than it did yesterday. He underwent an MRI scan of his lumbar spine last evening and was evaluated by neurosurgery today.   Objective: Vital signs in last 24 hours: Temp:  [97.9 F (36.6 C)-98.5 F (36.9 C)] 97.9 F (36.6 C) (06/13 1523) Pulse Rate:  [57-65] 65 (06/13 1523) Resp:  [18] 18 (06/13 0720) BP: (137-201)/(64-79) 137/64 (06/13 1523) SpO2:  [96 %-100 %] 96 % (06/13 1523)  Intake/Output from previous day: 06/12 0701 - 06/13 0700 In: 650 [P.O.:600; IV Piggyback:50] Out: 1525 [Urine:1525] Intake/Output this shift: Total I/O In: 360 [P.O.:360] Out: 425 [Urine:425]   Recent Labs  05/30/17 0339  HGB 11.0*    Recent Labs  05/30/17 0339  WBC 10.6  RBC 3.75*  HCT 31.7*  PLT 261    Recent Labs  05/29/17 0413 05/30/17 0339  NA 134* 134*  K 4.9 4.6  CL 102 102  CO2 24 24  BUN 46* 52*  CREATININE 2.35* 2.01*  GLUCOSE 156* 119*  CALCIUM 8.5* 8.6*   No results for input(s): LABPT, INR in the last 72 hours.  Physical Exam: Physical examination findings of his back, left hip, and left lower extremity are unchanged as compared to yesterday.  X-rays: An MRI scan of his lumbar spine was performed last evening. This scan demonstrates evidence of multilevel moderate central stenosis with mild-moderate foraminal stenosis bilaterally compounded by congenitally short pedicles with degenerative disc disease and a grade 1 anterolisthesis of L4 on L5, all of which served to exacerbate his multilevel lumbar stenosis. No fractures or lytic lesions are identified.  Assessment: Left buttock and lower extremity  pain, most likely secondary to lumbar radiculopathy secondary to underlying lumbar spondylosis and congenital lumbar stenosis.  Plan: The treatment options are reviewed with the patient. I agree with the neurosurgical recommendation to try a series of lumbar epidural steroid injections rather than to consider surgical intervention. This can be arranged either with Dr. Sharlet Salina or with Dr. Consuela Mimes either as an inpatient or as an outpatient, depending on their schedule. Meanwhile, he may continue to be mobilized with physical therapy, gradually progressing him as his symptoms permit. Most likely, he will require rehabilitation placement for a period of time.   [name] 05/30/2017, 4:11 PM

## 2017-05-30 NOTE — Progress Notes (Signed)
Patient ID: [name], male   DOB: [birth year], 82 y.o.   MRN: [id number]   [employer] PROGRESS NOTE  [name] ZMO:[id number] DOB: [birth year] DOA: 05/27/2017 PCP: Marguerita Merles, MD  HPI/Subjective: Patient in severe pain when I palpate around his knee. Patient severe pain when I palpate around his hip. Patient in severe pain when I palpate around his lower back. Patient having difficulty moving his left leg.  Objective: Vitals:   05/30/17 0600 05/30/17 0720  BP: (!) 201/66 (!) 174/75  Pulse: (!) 57 61  Resp:  18  Temp:  98 F (36.7 C)    Filed Weights   05/27/17 1204  Weight: 116.1 kg (256 lb)    ROS: Review of Systems  Constitutional: Negative for chills and fever.  Eyes: Negative for blurred vision.  Respiratory: Negative for cough and shortness of breath.   Cardiovascular: Negative for chest pain.  Gastrointestinal: Negative for abdominal pain, constipation, diarrhea, nausea and vomiting.  Genitourinary: Negative for dysuria.  Musculoskeletal: Positive for back pain and joint pain.  Neurological: Negative for dizziness and headaches.   Exam: Physical Exam  HENT:  Nose: No mucosal edema.  Mouth/Throat: No oropharyngeal exudate or posterior oropharyngeal edema.  Eyes: Conjunctivae, EOM and lids are normal. Pupils are equal, round, and reactive to light.  Neck: No JVD present. Carotid bruit is not present. No edema present. No thyroid mass and no thyromegaly present.  Cardiovascular: S1 normal and S2 normal.  Exam reveals no gallop.   No murmur heard. Pulses:      Dorsalis pedis pulses are 2+ on the right side, and 2+ on the left side.  Respiratory: No respiratory distress. He has wheezes in the right lower field. He has no rhonchi. He has no rales.  GI: Soft. Bowel sounds are normal. He exhibits distension. There is no tenderness.  Musculoskeletal:       Left hip: He exhibits decreased range of motion and tenderness.       Left knee: He exhibits  decreased range of motion. Tenderness found.  Lymphadenopathy:    He has no cervical adenopathy.  Neurological: He is alert. No cranial nerve deficit.  Patient able to straight leg raise today on his own  Skin: Skin is warm. No rash noted. Nails show no clubbing.  Psychiatric: He has a normal mood and affect.      Data Reviewed: Basic Metabolic Panel:  Recent Labs Lab 05/27/17 1209 05/28/17 0059 05/29/17 0413 05/30/17 0339  NA 136 133* 134* 134*  K 4.8 5.6* 4.9 4.6  CL 103 101 102 102  CO2 20* 24 24 24   GLUCOSE 85 118* 156* 119*  BUN 37* 42* 46* 52*  CREATININE 1.71* 2.05* 2.35* 2.01*  CALCIUM 8.8* 8.3* 8.5* 8.6*   CBC:  Recent Labs Lab 05/27/17 1209 05/30/17 0339  WBC 8.0 10.6  HGB 11.5* 11.0*  HCT 33.8* 31.7*  MCV 84.8 84.7  PLT 307 261   Cardiac Enzymes:  Recent Labs Lab 05/27/17 1209 05/27/17 1939 05/28/17 0059 05/28/17 0747  TROPONINI <0.03 <0.03 <0.03 <0.03   BNP (last 3 results)  Recent Labs  05/27/17 1209  BNP 42.0     CBG:  Recent Labs Lab 05/27/17 1445  GLUCAP 86      Studies: Mr Lumbar Spine Wo Contrast  Result Date: 05/29/2017 CLINICAL DATA:  Left buttock, lateral hip, and thigh pain radiating to the knee. EXAM: MRI LUMBAR SPINE WITHOUT CONTRAST TECHNIQUE: Multiplanar, multisequence MR imaging of the lumbar  spine was performed. No intravenous contrast was administered. COMPARISON:  CT abdomen 12/01/2016 FINDINGS: Body habitus reduces diagnostic sensitivity and specificity. Segmentation: The lowest lumbar type non-rib-bearing vertebra is labeled as L5. Alignment: 4 mm degenerative anterolisthesis at L4-5, no pars defects. Vertebrae:  Congenitally short pedicles in the lumbar spine. Disc desiccation at all levels between L2 and L5. Degenerative facet edema bilaterally at the L4-5 level. Conus medullaris: Extends to the upper L2 level and appears normal. Paraspinal and other soft tissues: Fluid signal intensity lesions associated with  the right kidney are faintly seen and most compatible with cysts. At least 1 similar lesion on the left. Interspinous edema at the L4-5 and L5-S1 levels on image 9/4. Disc levels: L1-2:  No impingement, mild disc bulge. L2-3: Prominent central narrowing of the thecal sac with moderate bilateral foraminal stenosis, mild-to-moderate bilateral subarticular lateral recess stenosis, and mild displacement of the right L2 nerve in the lateral extraforaminal space due to congenitally short pedicles, disc bulge, facet arthropathy, and ligamentum flavum redundancy. Cross-sectional area of the thecal sac 0.6 cm^2. L3-4: Prominent central narrowing of the thecal sac with moderate bilateral foraminal stenosis, mild to moderate bilateral subarticular lateral recess stenosis, and mild displacement of both L3 nerves in the lateral extraforaminal space due to congenitally short pedicles, disc bulge, intervertebral spurring, facet arthropathy, and ligamentum flavum redundancy. Cross-sectional area of the thecal sac 0.6 cm^2. L4-5: Prominent central narrowing of the thecal sac with moderate bilateral foraminal stenosis, moderate bilateral subarticular lateral recess stenosis, and mild displacement of both L4 nerves in the lateral extraforaminal space due to congenitally short pedicles, disc bulge, intervertebral spurring, disc uncovering, and facet arthropathy. Cross-sectional area of the thecal sac 0.6 cm^ 2. L5-S1: Mild left and borderline right foraminal stenosis due to facet arthropathy, disc bulge, and mild intervertebral spurring. IMPRESSION: 1. Congenitally short pedicles, lumbar spondylosis, and degenerative disc disease causing prominent impingement at L2- 3, L3-4, and L4-5; and mild impingement at L5-S1, as detailed above. 2. 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 3. Degenerative facet edema bilaterally at L4-5. 4. 4 mm of degenerative anterolisthesis at L4-5. 5. Suspected bilateral renal cysts.  These are only partially seen on today's exam but were also present on the CT scan from December 2017. Electronically Signed   By: Van Clines M.D.   On: 05/29/2017 19:23   US Venous Img Lower Unilateral Left  Result Date: 05/29/2017 CLINICAL DATA:  Pain for 6 weeks in the left leg. EXAM: LEFT LOWER EXTREMITY VENOUS DOPPLER ULTRASOUND TECHNIQUE: Gray-scale sonography with graded compression, as well as color Doppler and duplex ultrasound were performed to evaluate the lower extremity deep venous systems from the level of the common femoral vein and including the common femoral, femoral, profunda femoral, popliteal and calf veins including the posterior tibial, peroneal and gastrocnemius veins when visible. The superficial great saphenous vein was also interrogated. Spectral Doppler was utilized to evaluate flow at rest and with distal augmentation maneuvers in the common femoral, femoral and popliteal veins. COMPARISON:  None. FINDINGS: Contralateral Common Femoral Vein: Respiratory phasicity is normal and symmetric with the symptomatic side. No evidence of thrombus. Normal compressibility. Common Femoral Vein: No evidence of thrombus. Saphenofemoral Junction: No evidence of thrombus. Profunda Femoral Vein: No evidence of thrombus. Femoral Vein: No evidence of thrombus. Popliteal Vein: No evidence of thrombus. Calf Veins: No evidence of thrombus. IMPRESSION: No evidence of DVT within the left lower extremity. Electronically Signed   By: Neva Seat.D.  On: 05/29/2017 10:19   Dg Knee Complete 4 Views Left  Result Date: 05/28/2017 CLINICAL DATA:  Left knee pain.  No injury. EXAM: LEFT KNEE - COMPLETE 4+ VIEW COMPARISON:  None. FINDINGS: There is mild tricompartmental osteoarthritic change most prominent over the patellofemoral joint. No significant joint effusion. No evidence of fracture or dislocation. IMPRESSION: No acute findings per Mild tricompartmental osteoarthritis. Electronically Signed    By: Marin Olp M.D.   On: 05/28/2017 19:19    Scheduled Meds: . aspirin EC  81 mg Oral BH-q7a  . atorvastatin  40 mg Oral Daily  . famotidine  20 mg Oral Daily  . furosemide  20 mg Oral BID  . gabapentin  300 mg Oral TID  . heparin  5,000 Units Subcutaneous Q8H  . hydrALAZINE  100 mg Oral Q8H  . isosorbide mononitrate  60 mg Oral Daily  . levothyroxine  50 mcg Oral QAC breakfast  . losartan  25 mg Oral Daily  . magnesium oxide  400 mg Oral BID  . methylPREDNISolone (SOLU-MEDROL) injection  40 mg Intravenous Q12H  . metoprolol tartrate  50 mg Oral BID  . mometasone-formoterol  2 puff Inhalation BID  . tiotropium  1 capsule Inhalation Daily  . vitamin B-6  25 mg Oral Daily    . cefTRIAXone (ROCEPHIN)  IV Stopped (05/29/17 2108)    Assessment/Plan:  1. Left knee and left hip pain and back pain. Case discussed with Dr. Roland Rack orthopedic surgery. Case discussed with Dr. Cari Caraway neurosurgery. Case discussed with Dr. Anselm Pancoast interventional radiology. Interventional radiology would like to hold off on doing any further steroid injection since he did have a steroid injection on the knee. Can consider in 10 days if patient still having issues. I will give IV steroids today and reevaluate his pain tomorrow. Difficult exam since he is yelling in pain with light touch. The patient was able to straight leg raise today with his left leg 2. Acute kidney injury on chronic kidney disease stage III. Appreciate nephrology consultation. Restarted Lasix. IV fluids discontinued. 3. Hyperkalemia. This has improved with one dose of Kayexalate and IV fluids. Continue to hold losartan. 4. Chest pain after working with physical therapy. Cardiac enzymes were negative. Trial of nitroglycerin. Increased Imdur 5. Hyperlipidemia unspecified on atorvastatin 6. Hypothyroidism unspecified on levothyroxine 7. Essential hypertension on metoprolol, hydralazine and imdur 8. History of COPD on Spiriva 9. Neuropathy on  gabapentin 10. History of prostate cancer 11. History of congestive heart failure currently no signs.   Code Status:     Code Status Orders        Start     Ordered   05/27/17 1923  Full code  Continuous     05/27/17 1923    Code Status History    Date Active Date Inactive Code Status Order ID Comments User Context   12/01/2016  8:42 PM 12/03/2016  4:05 PM Full Code [id number]  Vaughan Basta, MD Inpatient   09/12/2016  8:42 AM 09/13/2016  8:30 PM Full Code [id number]  Alonna Buckler, RN Inpatient   09/11/2016  8:46 AM 09/11/2016  4:59 PM Full Code [id number]  Dustin Flock, MD ED   08/31/2016  3:33 AM 09/06/2016  5:14 PM Full Code [id number]  Saundra Shelling, MD Inpatient    Advance Directive Documentation     Most Recent Value  Type of Advance Directive  Healthcare Power of Attorney, Living will  Pre-existing out of facility DNR order (yellow form or pink MOST form)  -  "  MOST" Form in Place?  -     Family Communication: Spoke with daughter on the phone Disposition Plan: Likely will need rehabilitation tommorrow  Consultants:  Orthopedic surgery  Nephrology  Time spent: 24 minutes  Loletha Grayer  [employer]

## 2017-05-30 NOTE — Progress Notes (Signed)
Nurse asked [place] to visit with Pt. CH met with Pt. Pt stated he was irritated because the medical team has not been able to figure out what he is suffering from. Pt indicated he was in pain and nothing was helping to easy his pain. Pt was not a good mood at the time of this visit. [place] asked Pt if this was a good time to visit with him, Pt responded saying that this afternoon was not a good time to viist. [place] plans to have a follow up visit with Pt sometime tomorrow.

## 2017-05-30 NOTE — Plan of Care (Signed)
Problem: Bowel/Gastric: Goal: Will not experience complications related to bowel motility Outcome: Progressing Pt is progressing toward goals.

## 2017-05-30 NOTE — Progress Notes (Signed)
[facility]  ROUNDING NOTE   Subjective:   Continues to complain of pain.   Objective:  Vital signs in last 24 hours:  Temp:  [98 F (36.7 C)-98.5 F (36.9 C)] 98 F (36.7 C) (06/13 0720) Pulse Rate:  [57-65] 61 (06/13 0720) Resp:  [18] 18 (06/13 0720) BP: (174-201)/(66-79) 174/75 (06/13 0720) SpO2:  [96 %-100 %] 98 % (06/13 0720)  Weight change:  Filed Weights   05/27/17 1204  Weight: 116.1 kg (256 lb)    Intake/Output: I/O last 3 completed shifts: In: 1458.8 [P.O.:600; I.V.:808.8; IV Piggyback:50] Out: 2600 [Urine:2600]   Intake/Output this shift:  Total I/O In: 360 [P.O.:360] Out: 425 [Urine:425]  Physical Exam: General: NAD, laying in bed  Head: Normocephalic, atraumatic. Moist oral mucosal membranes  Eyes: Anicteric, PERRL  Neck: Supple, trachea midline  Lungs:  Clear to auscultation  Heart: Regular rate and rhythm  Abdomen:  Soft, nontender,   Extremities: No  peripheral edema.  Neurologic: Nonfocal, moving all four extremities  Skin: No lesions       Basic Metabolic Panel:  Recent Labs Lab 05/27/17 1209 05/28/17 0059 05/29/17 0413 05/30/17 0339  NA 136 133* 134* 134*  K 4.8 5.6* 4.9 4.6  CL 103 101 102 102  CO2 20* 24 24 24   GLUCOSE 85 118* 156* 119*  BUN 37* 42* 46* 52*  CREATININE 1.71* 2.05* 2.35* 2.01*  CALCIUM 8.8* 8.3* 8.5* 8.6*    Liver Function Tests: No results for input(s): AST, ALT, ALKPHOS, BILITOT, PROT, ALBUMIN in the last 168 hours. No results for input(s): LIPASE, AMYLASE in the last 168 hours. No results for input(s): AMMONIA in the last 168 hours.  CBC:  Recent Labs Lab 05/27/17 1209 05/30/17 0339  WBC 8.0 10.6  HGB 11.5* 11.0*  HCT 33.8* 31.7*  MCV 84.8 84.7  PLT 307 261    Cardiac Enzymes:  Recent Labs Lab 05/27/17 1209 05/27/17 1939 05/28/17 0059 05/28/17 0747  TROPONINI <0.03 <0.03 <0.03 <0.03    BNP: Invalid input(s): POCBNP  CBG:  Recent Labs Lab 05/27/17 1445  GLUCAP 86     Microbiology: Results for orders placed or performed during the hospital encounter of 08/31/16  Urine culture     Status: None   Collection Time: 08/30/16 10:49 PM  Result Value Ref Range Status   Specimen Description URINE, RANDOM  Final   Special Requests Normal  Final   Culture NO GROWTH Performed at [facility]   Final   Report Status 09/01/2016 FINAL  Final  Culture, blood (routine x 2)     Status: None   Collection Time: 08/31/16 12:20 AM  Result Value Ref Range Status   Specimen Description BLOOD  RIGHT AC  Final   Special Requests   Final    BOTTLES DRAWN AEROBIC AND ANAEROBIC  ANA 7ML AER 9ML   Culture NO GROWTH 5 DAYS  Final   Report Status 09/05/2016 FINAL  Final  Culture, blood (routine x 2)     Status: None   Collection Time: 08/31/16 12:30 AM  Result Value Ref Range Status   Specimen Description BLOOD  RIGHT HAND   Final   Special Requests   Final    BOTTLES DRAWN AEROBIC AND ANAEROBIC  ANA 8ML AER 8ML   Culture NO GROWTH 5 DAYS  Final   Report Status 09/05/2016 FINAL  Final    Coagulation Studies: No results for input(s): LABPROT, INR in the last 72 hours.  Urinalysis:  Recent Labs  05/29/17 1608  [place] 1.015  PHURINE 6.0  GLUCOSEU NEGATIVE  HGBUR SMALL*  BILIRUBINUR NEGATIVE  KETONESUR NEGATIVE  PROTEINUR 100*  NITRITE POSITIVE*  LEUKOCYTESUR TRACE*      Imaging: Mr Lumbar Spine Wo Contrast  Result Date: 05/29/2017 CLINICAL DATA:  Left buttock, lateral hip, and thigh pain radiating to the knee. EXAM: MRI LUMBAR SPINE WITHOUT CONTRAST TECHNIQUE: Multiplanar, multisequence MR imaging of the lumbar spine was performed. No intravenous contrast was administered. COMPARISON:  CT abdomen 12/01/2016 FINDINGS: Body habitus reduces diagnostic sensitivity and specificity. Segmentation: The lowest lumbar type non-rib-bearing vertebra is labeled as L5. Alignment: 4 mm degenerative anterolisthesis at L4-5, no pars defects.  Vertebrae:  Congenitally short pedicles in the lumbar spine. Disc desiccation at all levels between L2 and L5. Degenerative facet edema bilaterally at the L4-5 level. Conus medullaris: Extends to the upper L2 level and appears normal. Paraspinal and other soft tissues: Fluid signal intensity lesions associated with the right kidney are faintly seen and most compatible with cysts. At least 1 similar lesion on the left. Interspinous edema at the L4-5 and L5-S1 levels on image 9/4. Disc levels: L1-2:  No impingement, mild disc bulge. L2-3: Prominent central narrowing of the thecal sac with moderate bilateral foraminal stenosis, mild-to-moderate bilateral subarticular lateral recess stenosis, and mild displacement of the right L2 nerve in the lateral extraforaminal space due to congenitally short pedicles, disc bulge, facet arthropathy, and ligamentum flavum redundancy. Cross-sectional area of the thecal sac 0.6 cm^2. L3-4: Prominent central narrowing of the thecal sac with moderate bilateral foraminal stenosis, mild to moderate bilateral subarticular lateral recess stenosis, and mild displacement of both L3 nerves in the lateral extraforaminal space due to congenitally short pedicles, disc bulge, intervertebral spurring, facet arthropathy, and ligamentum flavum redundancy. Cross-sectional area of the thecal sac 0.6 cm^2. L4-5: Prominent central narrowing of the thecal sac with moderate bilateral foraminal stenosis, moderate bilateral subarticular lateral recess stenosis, and mild displacement of both L4 nerves in the lateral extraforaminal space due to congenitally short pedicles, disc bulge, intervertebral spurring, disc uncovering, and facet arthropathy. Cross-sectional area of the thecal sac 0.6 cm^ 2. L5-S1: Mild left and borderline right foraminal stenosis due to facet arthropathy, disc bulge, and mild intervertebral spurring. IMPRESSION: 1. Congenitally short pedicles, lumbar spondylosis, and degenerative disc  disease causing prominent impingement at L2- 3, L3-4, and L4-5; and mild impingement at L5-S1, as detailed above. 2. 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 3. Degenerative facet edema bilaterally at L4-5. 4. 4 mm of degenerative anterolisthesis at L4-5. 5. Suspected bilateral renal cysts. These are only partially seen on today's exam but were also present on the CT scan from December 2017. Electronically Signed   By: Van Clines M.D.   On: 05/29/2017 19:23   US Venous Img Lower Unilateral Left  Result Date: 05/29/2017 CLINICAL DATA:  Pain for 6 weeks in the left leg. EXAM: LEFT LOWER EXTREMITY VENOUS DOPPLER ULTRASOUND TECHNIQUE: Gray-scale sonography with graded compression, as well as color Doppler and duplex ultrasound were performed to evaluate the lower extremity deep venous systems from the level of the common femoral vein and including the common femoral, femoral, profunda femoral, popliteal and calf veins including the posterior tibial, peroneal and gastrocnemius veins when visible. The superficial great saphenous vein was also interrogated. Spectral Doppler was utilized to evaluate flow at rest and with distal augmentation maneuvers in the common femoral, femoral and popliteal veins. COMPARISON:  None. FINDINGS: Contralateral Common Femoral Vein:  Respiratory phasicity is normal and symmetric with the symptomatic side. No evidence of thrombus. Normal compressibility. Common Femoral Vein: No evidence of thrombus. Saphenofemoral Junction: No evidence of thrombus. Profunda Femoral Vein: No evidence of thrombus. Femoral Vein: No evidence of thrombus. Popliteal Vein: No evidence of thrombus. Calf Veins: No evidence of thrombus. IMPRESSION: No evidence of DVT within the left lower extremity. Electronically Signed   By: Monte Fantasia M.D.   On: 05/29/2017 10:19   Dg Knee Complete 4 Views Left  Result Date: 05/28/2017 CLINICAL DATA:  Left knee pain.  No injury. EXAM:  LEFT KNEE - COMPLETE 4+ VIEW COMPARISON:  None. FINDINGS: There is mild tricompartmental osteoarthritic change most prominent over the patellofemoral joint. No significant joint effusion. No evidence of fracture or dislocation. IMPRESSION: No acute findings per Mild tricompartmental osteoarthritis. Electronically Signed   By: Marin Olp M.D.   On: 05/28/2017 19:19     Medications:   . cefTRIAXone (ROCEPHIN)  IV Stopped (05/29/17 2108)   . aspirin EC  81 mg Oral BH-q7a  . atorvastatin  40 mg Oral Daily  . famotidine  20 mg Oral Daily  . furosemide  20 mg Oral BID  . gabapentin  300 mg Oral TID  . heparin  5,000 Units Subcutaneous Q8H  . hydrALAZINE  100 mg Oral Q8H  . isosorbide mononitrate  60 mg Oral Daily  . levothyroxine  50 mcg Oral QAC breakfast  . losartan  25 mg Oral Daily  . magnesium oxide  400 mg Oral BID  . methylPREDNISolone (SOLU-MEDROL) injection  40 mg Intravenous Q12H  . metoprolol tartrate  50 mg Oral BID  . mometasone-formoterol  2 puff Inhalation BID  . tiotropium  1 capsule Inhalation Daily  . vitamin B-6  25 mg Oral Daily   albuterol, nitroGLYCERIN, ondansetron (ZOFRAN) IV, oxyCODONE  Assessment/ Plan:  [name] is a 82 y.o. black male  with COPD, hypertension, hyperlipidemia, congestive heart failure, coronary artery disease, bipolar disorder,  who was admitted to [facility] on 05/27/2017 for short of breath.   1. Acute renal failure on chronic kidney disease stage III with hyperkalemia and proteinuria. Baseline Creatinine 2.16, GFR of 36 on 2/21. Followed by [facility].  Chronic kidney disease secondary to hypertension and Lithium exposure Creatinine on admission of 1.71 and jumped to 2.35.  - Continue furosemide - restart losartan - Renally dose all medications. Avoid NSAIDs.   2. Hypertension: not well controlled - Home regimen of furosemide, hydralazine, isosorbide mononitrate, metoprolol and low dose losartan - Restarted losartan due to  hyperkalemia - If more blood pressure control required, will consider amlodipine.   LOS: 2 Jessikah Dicker 6/13/20183:04 PM

## 2017-05-30 NOTE — Progress Notes (Signed)
Shift assessment completed. Pt rating pain at 8/10 on recheck after having received oxycodone one hour prior. Pain is to his L leg. Pt is alert and oriented, on room air, denied chest pain, is able to deep breathe and cough, lungs are clear bilat. Hr is regular,abdomen is soft, bs hypoactive. Pt is voiding clear yellow urine in urinal.ppp, trace non pitting edema noted to lower legs. piv #20 intact to r hand site is free of redness and swelling. Since assessment, pt has voiced no complaints. Call bell in reach.

## 2017-05-30 NOTE — Consult Note (Signed)
Referring Physician:  No referring provider defined for this encounter.  Primary Physician:  Marguerita Merles, MD   Chief Complaint:  Knee, hip, back pain  History of Present Illness: [name] is a 82 y.o. male who presents with the chief complaint of lower extremity pain.  He initially saw the [facility] 6-7 weeks ago when he was having L knee pain. He was placed on narcotics for pain control, which ultimately did not control his pain.  He was undergoing conservative management for his pain, but ultimately returned to the ER when he ran out of pain medications.  He was admitted for pain control. Dr. Roland Rack saw him and recommended injections, which did not decrease his pain. He ultimately had an MRI, which led to [place] consultation.    He now describes pain in the left knee, left hip, and sometimes in his back. He has not been able to bear full weight on his left leg.    He describes one or two accidents with bowel and bladder control.  Of note, he is not the best historian of his condition.   The symptoms are causing a significant impact on the patient's life.   Review of Systems:  A 10 point review of systems is negative, except for the pertinent positives and negatives detailed in the HPI.  Past Medical History: Past Medical History:  Diagnosis Date  . Alcoholism ([place])   . Asthma   . Bipolar affective disorder ([place])   . CHF (congestive heart failure) ([place])   . COPD (chronic obstructive pulmonary disease) ([facility])    COPD  . Coronary artery disease February 2005   PCI and Taxus drug-eluting stent placement to the distal RCA (3.5 x 12 mm)  . Degenerative joint disease   . Essential hypertension   . Hyperlipidemia   . Hypertension   . Prostate CA [facility])    prostate ca dx 83 ys ago;  . Prostate CA [facility])    prostate ca dx 20 yrs ago  . PVD (peripheral vascular disease) ([place])   . Tobacco abuse     Past Surgical History: Past Surgical History:  Procedure  Laterality Date  . CARDIAC CATHETERIZATION    . CORONARY ANGIOPLASTY WITH STENT PLACEMENT    . TOTAL HIP ARTHROPLASTY     right    Allergies: Allergies as of 05/27/2017 - Review Complete 05/27/2017  Allergen Reaction Noted  . Amlodipine  12/03/2015  . Benazepril-hydrochlorothiazide  12/03/2015  . Chlorthalidone  12/14/2015  . Other Other (See Comments) 02/11/2016    Medications:  Current Facility-Administered Medications:  .  albuterol (PROVENTIL) (2.5 MG/3ML) 0.083% nebulizer solution 3 mL, 3 mL, Inhalation, Q6H PRN, Baxter Hire, MD .  aspirin EC tablet 81 mg, 81 mg, Oral, Charolett Bumpers, MD, 81 mg at 05/30/17 0723 .  atorvastatin (LIPITOR) tablet 40 mg, 40 mg, Oral, Daily, Baxter Hire, MD, 40 mg at 05/30/17 0942 .  cefTRIAXone (ROCEPHIN) 1 g in dextrose 5 % 50 mL IVPB, 1 g, Intravenous, Q24H, Loletha Grayer, MD, Stopped at 05/29/17 2108 .  famotidine (PEPCID) tablet 20 mg, 20 mg, Oral, Daily, Baxter Hire, MD, 20 mg at 05/30/17 0942 .  furosemide (LASIX) tablet 20 mg, 20 mg, Oral, BID, Kolluru, Sarath, MD, 20 mg at 05/30/17 0723 .  gabapentin (NEURONTIN) capsule 300 mg, 300 mg, Oral, TID, Baxter Hire, MD, 300 mg at 05/30/17 0942 .  heparin injection 5,000 Units, 5,000 Units, Subcutaneous, Q8H, Edwina Barth,  Chrystie Nose, MD, 5,000 Units at 05/30/17 1355 .  hydrALAZINE (APRESOLINE) tablet 100 mg, 100 mg, Oral, Q8H, Wieting, Richard, MD, 100 mg at 05/30/17 1355 .  isosorbide mononitrate (IMDUR) 24 hr tablet 60 mg, 60 mg, Oral, Daily, Loletha Grayer, MD, 60 mg at 05/30/17 0942 .  levothyroxine (SYNTHROID, LEVOTHROID) tablet 50 mcg, 50 mcg, Oral, QAC breakfast, Baxter Hire, MD, 50 mcg at 05/30/17 0723 .  losartan (COZAAR) tablet 25 mg, 25 mg, Oral, Daily, Kolluru, Sarath, MD, 25 mg at 05/30/17 1153 .  magnesium oxide (MAG-OX) tablet 400 mg, 400 mg, Oral, BID, Baxter Hire, MD, 400 mg at 05/30/17 1152 .  methylPREDNISolone sodium succinate (SOLU-MEDROL) 40  mg/mL injection 40 mg, 40 mg, Intravenous, Q12H, Wieting, Richard, MD, 40 mg at 05/30/17 1152 .  metoprolol tartrate (LOPRESSOR) tablet 50 mg, 50 mg, Oral, BID, Baxter Hire, MD, 50 mg at 05/30/17 0942 .  mometasone-formoterol (DULERA) 200-5 MCG/ACT inhaler 2 puff, 2 puff, Inhalation, BID, Baxter Hire, MD, 2 puff at 05/30/17 0946 .  nitroGLYCERIN (NITROSTAT) SL tablet 0.4 mg, 0.4 mg, Sublingual, Q5 min PRN, Wieting, Richard, MD .  ondansetron [facility]) injection 4 mg, 4 mg, Intravenous, Q6H PRN, Baxter Hire, MD .  oxyCODONE (Oxy IR/ROXICODONE) immediate release tablet 5 mg, 5 mg, Oral, Q4H PRN, Baxter Hire, MD, 5 mg at 05/30/17 1147 .  tiotropium (SPIRIVA) inhalation capsule 18 mcg, 1 capsule, Inhalation, Daily, Baxter Hire, MD, 18 mcg at 05/30/17 0945 .  vitamin B-6 (pyridOXINE) tablet 25 mg, 25 mg, Oral, Daily, Baxter Hire, MD, 25 mg at 05/30/17 0277   Social History: Social History  Substance Use Topics  . Smoking status: Former Smoker    Packs/day: 1.00    Years: 50.00    Types: Cigarettes  . Smokeless tobacco: Never Used  . Alcohol use Yes     Comment: social     Family Medical History: Family History  Problem Relation Age of Onset  . Hypertension Mother   . Hyperlipidemia Mother   . Heart attack Mother   . Hypertension Father   . Hyperlipidemia Father   . Heart attack Father     Physical Examination: Vitals:   05/30/17 0600 05/30/17 0720  BP: (!) 201/66 (!) 174/75  Pulse: (!) 57 61  Resp:  18  Temp:  98 F (36.7 C)     General: Patient is well developed, well nourished, calm, collected, and in no apparent distress.  Psychiatric: Patient is non-anxious.  Head:  Pupils equal, round, and reactive to light.  ENT:  Oral mucosa appears well hydrated.  Neck:   Supple.  Full range of motion.  Respiratory: Patient is breathing without any difficulty.  Extremities: No edema.  Vascular: Palpable pulses in dorsal pedal vessels.  Skin:    On exposed skin, there are no abnormal skin lesions.  NEUROLOGICAL:  General: In mild distress.   Awake, alert, oriented to person, place, and time.  Pupils equal round and reactive to light.  Facial tone is symmetric.  Tongue protrusion is midline.  There is no pronator drift.  ROM of spine: untested.  Palpation of spine: nontender.    Strength: Side Biceps Triceps Deltoid Interossei Grip Wrist Ext. Wrist Flex.  R 5 5 5 5 5 5 5   L 5 5 5 5 5 5 5    Side Iliopsoas Quads Hamstring PF DF EHL  R 5 5 5 5 5 5   L 5 5 5 5 5  5  Reflexes are 2+ and symmetric at the biceps, triceps, brachioradialis, patella and achilles.   Bilateral upper and lower extremity sensation is intact to light touch and pin prick.  Clonus is not present.  Toes are down-going.  Gait is normal.  No difficulty with tandem gait.  Hoffman's is absent.  Imaging: MRI L spine 05/29/17 IMPRESSION: 1. Congenitally short pedicles, lumbar spondylosis, and degenerative disc disease causing prominent impingement at L2- 3, L3-4, and L4-5; and mild impingement at L5-S1, as detailed above. 2. 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 3. Degenerative facet edema bilaterally at L4-5. 4. 4 mm of degenerative anterolisthesis at L4-5. 5. Suspected bilateral renal cysts. These are only partially seen on today's exam but were also present on the CT scan from December 2017.   Electronically Signed   By: Van Clines M.D.   On: 05/29/2017 19:23  I have personally reviewed the images and agree with the above interpretation.  Assessment and Plan: Mr. Molyneux is a pleasant 82 y.o. male with lumbar spondylosis and possible lumbar radiculopathy.  He has not maximized medical therapy, and does not seem to be a great surgical candidate.    Recs: - Physical Therapy - Consider lumbar epidural steroid injections - Would consider increasing steroids if patient can tolerate medically - Would refer to Dr.  Sharlet Salina or Dr. Dossie Arbour for non-operative management      Ardyth Gal K. Izora Ribas MD, [place] Dept. of Neurosurgery

## 2017-05-30 NOTE — Progress Notes (Signed)
Physical Therapy Treatment Patient Details Name: [name] MRN: [id number] DOB: 05-02-1935 Today's Date: 05/30/2017    History of Present Illness 82 y/o male admitted to ED on 05/27/2017 with L hip and knee pain. No trauma or falls reported. He was seen at [facility] about 1 month ago for this complaint, and imaging was negative for fractures. Pt came to ED due to his pain intensifying. Upon initial evaluation at the hospital, pt had a brief episode of syncope. Pt's potassium was found to be severely elevated. Imaging repeated in hospital and was negative and his L THA was still intact. PMH includes PAD, COPD, alcoholism, bipolar disorder, CHF, HTN, PVD, prostate cancer, and stage III chronic kidney disease.     PT Comments    Pt progressing toward his goals. Ambulated 63ft from EOB to recliner with RW and +2 mod assist for safety. Pt able to tolerate partial weight bearing through his L LE with RW, but this continues to limit his mobility. Pt continues to be motivated to participate in therapy despite these symptoms. Neurosurgeon consulted today. Decided to pursue conservative management versus surgery for treatment of his low back and L LE pain. Plan to continue PT and lumbar epidural steroid injections. Will plan to progress.    Follow Up Recommendations  SNF     Equipment Recommendations       Recommendations for Other Services       Precautions / Restrictions Precautions Precautions: Fall Restrictions Weight Bearing Restrictions: No    Mobility  Bed Mobility Overal bed mobility: Needs Assistance Bed Mobility: Supine to Sit     Supine to sit: Min assist (1 PT needed this session for bed mobility)     General bed mobility comments: Mild dizziness noted at EOB, which subsided quickly. Pt able to perform bed mobility with less assistance and pain this session.  Transfers Overall transfer level: Needs assistance Equipment used: Rolling walker (2 wheeled) Transfers: Sit  to/from Stand Sit to Stand: Min assist;+2 safety/equipment         General transfer comment: Able to stand with less assistance/verbal cueing for hand placement on RW. No dizziness noted once standing. Pt able to put partial weight through L LE once standing.   Ambulation/Gait Ambulation/Gait assistance: Mod assist;+2 safety/equipment;+2 physical assistance Ambulation Distance (Feet): 5 Feet Assistive device: Rolling walker (2 wheeled) Gait Pattern/deviations: Step-to pattern;Decreased step length - right;Decreased stance time - left;Wide base of support;Trunk flexed     General Gait Details: Ambulated from EOB to recliner with RW and mod assist +2 for safety. No LOB or knee buckling noted. Pt experienced increased pain with weight bearing, but was able to tolerate ambulating this distance.   Stairs            Wheelchair Mobility    Modified Rankin (Stroke Patients Only)       Balance Overall balance assessment: Needs assistance             Standing balance comment: Stood with both hands on RW. No unsteadiness noted. Balance limited secondary to pain and apprehension weight bearing through L LE.                            Cognition Arousal/Alertness: Awake/alert Behavior During Therapy: WFL for tasks assessed/performed Overall Cognitive Status: Within Functional Limits for tasks assessed  Exercises Other Exercises Other Exercises: Supine ther-ex x10 included: shoulder flex (2x10), elbow flex/ext with PT resistance, supine crunches with HOB inclined 60deg, ankle pumps, and heel slides. Performed with supervision and verbal cueing for encouragement and sequencing.    General Comments        Pertinent Vitals/Pain Pain Assessment: 0-10 Pain Score: 5  Pain Location: low back, L hip/knee Pain Descriptors / Indicators: Grimacing;Moaning Pain Intervention(s): Limited activity within patient's  tolerance;Monitored during session;Repositioned    Home Living                      Prior Function            PT Goals (current goals can now be found in the care plan section) Acute Rehab PT Goals Patient Stated Goal: to hurt less PT Goal Formulation: With patient Time For Goal Achievement: 06/12/17 Potential to Achieve Goals: Good Progress towards PT goals: Progressing toward goals    Frequency    Min 2X/week      PT Plan Current plan remains appropriate    Co-evaluation              AM-PAC PT "6 Clicks" Daily Activity  Outcome Measure  Difficulty turning over in bed (including adjusting bedclothes, sheets and blankets)?: A Lot Difficulty moving from lying on back to sitting on the side of the bed? : Total Difficulty sitting down on and standing up from a chair with arms (e.g., wheelchair, bedside commode, etc,.)?: Total Help needed moving to and from a bed to chair (including a wheelchair)?: A Lot Help needed walking in hospital room?: A Lot Help needed climbing 3-5 steps with a railing? : Total 6 Click Score: 9    End of Session Equipment Utilized During Treatment: Gait belt Activity Tolerance: Patient limited by pain Patient left: in chair;with call bell/phone within reach;with chair alarm set Nurse Communication: Mobility status PT Visit Diagnosis: Other abnormalities of gait and mobility (R26.89);Muscle weakness (generalized) (M62.81);Pain Pain - Right/Left: Left Pain - part of body: Hip;Knee (low back)     Time: 2549-8264 PT Time Calculation (min) (ACUTE ONLY): 25 min  Charges:                       G Codes:       [name], PT, SPT  [name] 05/30/2017, 4:23 PM

## 2017-05-30 NOTE — Progress Notes (Signed)
Clinical Education officer, museum (CSW) contacted patient's daughter [name] and presented bed offers. Daughter chose [facility]. [facility] admissions coordinator at [facility] care is aware of accepted bed offer and stated that she likely have a private room for patient. Patient is aware of above. CSW will continue to follow and assist as needed.   [employer], LCSW [phone]

## 2017-05-31 LAB — URINE CULTURE: Special Requests: NORMAL

## 2017-05-31 MED ORDER — GABAPENTIN 100 MG PO CAPS: 100.0000 mg | ORAL_CAPSULE | Freq: Three times a day (TID) | ORAL | 0 refills | Status: DC

## 2017-05-31 MED ORDER — OXYCODONE HCL 5 MG PO CAPS: 5.0000 mg | ORAL_CAPSULE | ORAL | 0 refills | Status: DC | PRN

## 2017-05-31 MED ORDER — FLUTICASONE PROPIONATE 50 MCG/ACT NA SUSP
1.0000 | Freq: Every day | NASAL | Status: DC
  Filled 2017-05-31: qty 16

## 2017-05-31 MED ORDER — BISACODYL 10 MG RE SUPP
10.0000 mg | Freq: Once | RECTAL | Status: AC
  Administered 2017-05-31: 10 mg via RECTAL
  Filled 2017-05-31: qty 1

## 2017-05-31 MED ORDER — ISOSORBIDE MONONITRATE ER 60 MG PO TB24: 60.0000 mg | ORAL_TABLET | Freq: Every day | ORAL | 0 refills | Status: DC

## 2017-05-31 MED ORDER — HYDRALAZINE HCL 100 MG PO TABS: 100.0000 mg | ORAL_TABLET | Freq: Three times a day (TID) | ORAL | 0 refills | Status: DC

## 2017-05-31 MED ORDER — POLYETHYLENE GLYCOL 3350 17 G PO PACK: 17.0000 g | PACK | Freq: Every day | ORAL | 0 refills | Status: DC

## 2017-05-31 MED ORDER — HYDRALAZINE HCL 50 MG PO TABS: 100.0000 mg | ORAL_TABLET | Freq: Three times a day (TID) | ORAL | Status: DC

## 2017-05-31 MED ORDER — ALBUTEROL SULFATE HFA 108 (90 BASE) MCG/ACT IN AERS: 2.0000 | INHALATION_SPRAY | Freq: Four times a day (QID) | RESPIRATORY_TRACT | 0 refills | Status: DC | PRN

## 2017-05-31 MED ORDER — PREDNISONE 5 MG PO TABS: ORAL_TABLET | ORAL | 0 refills | Status: DC

## 2017-05-31 MED ORDER — FLUTICASONE PROPIONATE 50 MCG/ACT NA SUSP: 1.0000 | Freq: Every day | NASAL | 0 refills | Status: DC

## 2017-05-31 NOTE — Clinical Social Work Placement (Signed)
   CLINICAL SOCIAL WORK PLACEMENT  NOTE  Date:  05/31/2017  Patient Details  Name: [name] MRN: [id number] Date of Birth: 07/03/1935  Clinical Social Work is seeking post-discharge placement for this patient at the [place] level of care (*CSW will initial, date and re-position this form in  chart as items are completed):  Yes   Patient/family provided with [place] Work Department's list of facilities offering this level of care within the geographic area requested by the patient (or if unable, by the patient's family).  Yes   Patient/family informed of their freedom to choose among providers that offer the needed level of care, that participate in Medicare, Medicaid or managed care program needed by the patient, have an available bed and are willing to accept the patient.  Yes   Patient/family informed of [facility]'s ownership interest in [facility] and [facility], as well as of the fact that they are under no obligation to receive care at these facilities.  PASRR submitted to EDS on       PASRR number received on       Existing PASRR number confirmed on 05/29/17     FL2 transmitted to all facilities in geographic area requested by pt/family on 05/29/17     FL2 transmitted to all facilities within larger geographic area on       Patient informed that his/her managed care company has contracts with or will negotiate with certain facilities, including the following:        Yes   Patient/family informed of bed offers received.  Patient chooses bed at  [facility] )     Physician recommends and patient chooses bed at      Patient to be transferred to  [facility] ) on 05/31/17.  Patient to be transferred to facility by  [facility] )     Patient family notified on 05/31/17 of transfer.  Name of family member notified:   (Patient's daughter [name] is aware of D/C today. )     PHYSICIAN       Additional Comment:    _______________________________________________ Breckan Cafiero, Veronia Beets, LCSW 05/31/2017, 9:45 AM

## 2017-05-31 NOTE — Care Management Important Message (Signed)
Important Message  Patient Details  Name: [name] MRN: [id number] Date of Birth: 07-Jan-1935   Medicare Important Message Given:  Yes    Jolly Mango, RN 05/31/2017, 9:20 AM

## 2017-05-31 NOTE — Progress Notes (Signed)
Pt BP elevated, MD aware. Will administer BP medications and recheck.

## 2017-05-31 NOTE — Discharge Instructions (Signed)
Knee Pain, Adult Knee pain in adults is common. It can be caused by many things, including:  Arthritis.  A fluid-filled sac (cyst) or growth in your knee.  An infection in your knee.  An injury that will not heal.  Damage, swelling, or irritation of the tissues that support your knee.  Knee pain is usually not a sign of a serious problem. The pain may go away on its own with time and rest. If it does not, a health care provider may order tests to find the cause of the pain. These may include:  Imaging tests, such as an X-ray, MRI, or ultrasound.  Joint aspiration. In this test, fluid is removed from the knee.  Arthroscopy. In this test, a lighted tube is inserted into knee and an image is projected onto a TV screen.  A biopsy. In this test, a sample of tissue is removed from the body and studied under a microscope.  Follow these instructions at home: Pay attention to any changes in your symptoms. Take these actions to relieve your pain. Activity  Rest your knee.  Do not do things that cause pain or make pain worse.  Avoid high-impact activities or exercises, such as running, jumping rope, or doing jumping jacks. General instructions  Take over-the-counter and prescription medicines only as told by your health care provider.  Raise (elevate) your knee above the level of your heart when you are sitting or lying down.  Sleep with a pillow under your knee.  If directed, apply ice to the knee: ? Put ice in a plastic bag. ? Place a towel between your skin and the bag. ? Leave the ice on for 20 minutes, 2-3 times a day.  Ask your health care provider if you should wear an elastic knee support.  Lose weight if you are overweight. Extra weight can put pressure on your knee.  Do not use any products that contain nicotine or tobacco, such as cigarettes and e-cigarettes. Smoking may slow the healing of any bone and joint problems that you may have. If you need help quitting, ask  your health care provider. Contact a health care provider if:  Your knee pain continues, changes, or gets worse.  You have a fever along with knee pain.  Your knee buckles or locks up.  Your knee swells, and the swelling becomes worse. Get help right away if:  Your knee feels warm to the touch.  You cannot move your knee.  You have severe pain in your knee.  You have chest pain.  You have trouble breathing. Summary  Knee pain in adults is common. It can be caused by many things, including, arthritis, infection, cysts, or injury.  Knee pain is usually not a sign of a serious problem, but if it does not go away, a health care provider may perform tests to know the cause of the pain.  Pay attention to any changes in your symptoms. Relieve your pain with rest, medicines, light activity, and use of ice.  Get help if your pain continues or becomes very severe, or if your knee buckles or locks up, or if you have chest pain or trouble breathing. This information is not intended to replace advice given to you by your health care provider. Make sure you discuss any questions you have with your health care provider. Document Released: 10/01/2007 Document Revised: 11/24/2016 Document Reviewed: 11/24/2016 Elsevier Interactive Patient Education  2018 [employer].   Hip Pain The hip is the joint  between the upper legs and the lower pelvis. The bones, cartilage, tendons, and muscles of your hip joint support your body and allow you to move around. Hip pain can range from a minor ache to severe pain in one or both of your hips. The pain may be felt on the inside of the hip joint near the groin, or the outside near the buttocks and upper thigh. You may also have swelling or stiffness. Follow these instructions at home: Managing pain, stiffness, and swelling  If directed, apply ice to the injured area. ? Put ice in a plastic bag. ? Place a towel between your skin and the bag. ? Leave the  ice on for 20 minutes, 2-3 times a day  Sleep with a pillow between your legs on your most comfortable side.  Avoid any activities that cause pain. General instructions  Take over-the-counter and prescription medicines only as told by your health care provider.  Do any exercises as told by your health care provider.  Record the following: ? How often you have hip pain. ? The location of your pain. ? What the pain feels like. ? What makes the pain worse.  Keep all follow-up visits as told by your health care provider. This is important. Contact a health care provider if:  You cannot put weight on your leg.  Your pain or swelling continues or gets worse after one week.  It gets harder to walk.  You have a fever. Get help right away if:  You fall.  You have a sudden increase in pain and swelling in your hip.  Your hip is red or swollen or very tender to touch. Summary  Hip pain can range from a minor ache to severe pain in one or both of your hips.  The pain may be felt on the inside of the hip joint near the groin, or the outside near the buttocks and upper thigh.  Avoid any activities that cause pain.  Record how often you have hip pain, the location of the pain, what makes it worse and what it feels like. This information is not intended to replace advice given to you by your health care provider. Make sure you discuss any questions you have with your health care provider. Document Released: 05/24/2010 Document Revised: 11/06/2016 Document Reviewed: 11/06/2016 Elsevier Interactive Patient Education  2017 [employer].

## 2017-05-31 NOTE — Discharge Summary (Signed)
[place] at [place] NAME: [name]    MR#:  [id number]  DATE OF BIRTH:  Apr 09, 1935  DATE OF ADMISSION:  05/27/2017 ADMITTING PHYSICIAN: Baxter Hire, MD  DATE OF DISCHARGE: 05/31/2017  PRIMARY CARE PHYSICIAN: Marguerita Merles, MD    ADMISSION DIAGNOSIS:  short of breath  DISCHARGE DIAGNOSIS:  Active Problems:   Syncope   Hyperkalemia   SECONDARY DIAGNOSIS:   Past Medical History:  Diagnosis Date  . Alcoholism ([place])   . Asthma   . Bipolar affective disorder ([place])   . CHF (congestive heart failure) ([place])   . COPD (chronic obstructive pulmonary disease) (H[facility])    COPD  . Coronary artery disease February 2005   PCI and Taxus drug-eluting stent placement to the distal RCA (3.5 x 12 mm)  . Degenerative joint disease   . Essential hypertension   . Hyperlipidemia   . Hypertension   . Prostate CA [facility])    prostate ca dx 80 ys ago;  . Prostate CA [facility])    prostate ca dx 80 yrs ago  . PVD (peripheral vascular disease) ([place])   . Tobacco abuse     HOSPITAL COURSE:   1.  Left knee, left hip and back pain. Case discussed yesterday with Dr. head interventional radiology and they would like to hold off on any further steroid injections since the patient already had a steroid injection here. We'll refer to Dr. Consuela Mimes as outpatient for pain management and consideration of epidural injection. Systemic steroids given while here in the hospital. Prednisone taper as outpatient. Patient feeling a little bit better but still having lots of pain. When necessary pain medications ordered. Exam is difficult since he is yelling in pain with just light touch. 2. Acute kidney injury on chronic kidney disease stage III. Appreciate nephrology consultation they restarted Lasix. 3. Hyperkalemia. This had improved with one dose of Kayexalate and IV fluid hydration. I held losartan but nephrology restarted it. 4. Chest pain after working with physical  therapy. Cardiac enzymes are negative. I increased the patient's Imdur. 5. Hyperlipidemia unspecified on atorvastatin 6. Hypothyroidism unspecified on levothyroxine 7. History of COPD on Spiriva 8. History of neuropathy on gabapentin. Now renally dosed 9. History of prostate cancer. 10. History of mid range congestive heart failure. Currently no signs. On metoprolol, Lasix and losartan. Spironolactone contraindicated secondary to chronic kidney disease 11. Constipation. I will give a Dulcolax suppository while here. Can do MiraLAX daily as outpatient.  DISCHARGE CONDITIONS:   Fair  CONSULTS OBTAINED:  Treatment Team:  Reche Dixon, PA-C Poggi, Marshall Cork, MD Lavonia Dana, MD  DRUG ALLERGIES:   Allergies  Allergen Reactions  . Amlodipine     Constipation   . Benazepril-Hydrochlorothiazide     Constipation   . Chlorthalidone     Hyponatremia  . Other Other (See Comments)    "ANY BLOOD PRESSURE MEDICATIONS THAT I'VE TRIED" - PT. DOES NOT REMEMBER WHICH ONES    DISCHARGE MEDICATIONS:   Current Discharge Medication List    START taking these medications   Details  fluticasone (FLONASE) 50 MCG/ACT nasal spray Place 1 spray into both nostrils daily. Qty: 16 g, Refills: 0    polyethylene glycol (MIRALAX) packet Take 17 g by mouth daily. Qty: 30 each, Refills: 0    predniSONE (DELTASONE) 5 MG tablet 6 tabs po day1; 5 tabs po day2; 4 tabs po day3; 3 tabs po day4; 2 tabs po day5; 1 tab po day  6,7 Qty: 22 tablet, Refills: 0      CONTINUE these medications which have CHANGED   Details  albuterol (PROAIR HFA) 108 (90 Base) MCG/ACT inhaler Inhale 2 puffs into the lungs every 6 (six) hours as needed for wheezing or shortness of breath. Reported on 04/21/2016 Qty: 1 Inhaler, Refills: 0    gabapentin (NEURONTIN) 100 MG capsule Take 1 capsule (100 mg total) by mouth 3 (three) times daily. Qty: 90 capsule, Refills: 0    hydrALAZINE (APRESOLINE) 100 MG tablet Take 1 tablet (100 mg  total) by mouth 4 (four) times daily -  with meals and at bedtime. Qty: 120 tablet, Refills: 0    isosorbide mononitrate (IMDUR) 60 MG 24 hr tablet Take 1 tablet (60 mg total) by mouth daily. Qty: 30 tablet, Refills: 0    oxycodone (OXY-IR) 5 MG capsule Take 1 capsule (5 mg total) by mouth every 4 (four) hours as needed. Qty: 30 capsule, Refills: 0      CONTINUE these medications which have NOT CHANGED   Details  aspirin EC 81 MG tablet Take 81 mg by mouth every morning.    atorvastatin (LIPITOR) 40 MG tablet Take 40 mg by mouth daily.    fluticasone-salmeterol (ADVAIR HFA) 115-21 MCG/ACT inhaler Inhale 2 puffs into the lungs 2 (two) times daily.    furosemide (LASIX) 40 MG tablet Take 0.5 tablets (20 mg total) by mouth 2 (two) times daily. Qty: 60 tablet, Refills: 0    ketoconazole (NIZORAL) 2 % shampoo Apply 1 application topically 3 (three) times a week.     levothyroxine (SYNTHROID, LEVOTHROID) 50 MCG tablet Take 50 mcg by mouth daily before breakfast.    magnesium oxide (MAG-OX) 400 MG tablet Take 400 mg by mouth 2 (two) times daily.     metoprolol (LOPRESSOR) 50 MG tablet Take 1 tablet (50 mg total) by mouth 2 (two) times daily. Qty: 60 tablet, Refills: 0    vitamin B-6 (PYRIDOXINE) 25 MG tablet Take 25 mg by mouth daily.    losartan (COZAAR) 25 MG tablet Take 25 mg by mouth daily.    nitroGLYCERIN (NITROSTAT) 0.4 MG SL tablet Take 0.4 mg by mouth every 5 (five) minutes x 3 doses as needed for chest pain. As needed for chest pain    ondansetron (ZOFRAN) 4 MG tablet Take 1 tablet (4 mg total) by mouth every 8 (eight) hours as needed for nausea or vomiting. Qty: 20 tablet, Refills: 0    Tiotropium Bromide Monohydrate (SPIRIVA RESPIMAT) 1.25 MCG/ACT AERS Inhale 1 puff into the lungs daily.      STOP taking these medications     HYDROcodone-acetaminophen (NORCO/VICODIN) 5-325 MG tablet      famotidine (PEPCID) 20 MG tablet          DISCHARGE INSTRUCTIONS:    Follow-up with Dr. rehabilitation one day Follow-up with pain management as outpatient  If you experience worsening of your admission symptoms, develop shortness of breath, life threatening emergency, suicidal or homicidal thoughts you must seek medical attention immediately by calling 911 or calling your MD immediately  if symptoms less severe.  You Must read complete instructions/literature along with all the possible adverse reactions/side effects for all the Medicines you take and that have been prescribed to you. Take any new Medicines after you have completely understood and accept all the possible adverse reactions/side effects.   Please note  You were cared for by a hospitalist during your hospital stay. If you have any questions about your discharge  medications or the care you received while you were in the hospital after you are discharged, you can call the unit and asked to speak with the hospitalist on call if the hospitalist that took care of you is not available. Once you are discharged, your primary care physician will handle any further medical issues. Please note that NO REFILLS for any discharge medications will be authorized once you are discharged, as it is imperative that you return to your primary care physician (or establish a relationship with a primary care physician if you do not have one) for your aftercare needs so that they can reassess your need for medications and monitor your lab values.    Today   CHIEF COMPLAINT:   Chief Complaint  Patient presents with  . Shortness of Breath    HISTORY OF PRESENT ILLNESS:  [name]  is a 82 y.o. male with a known history of    VITAL SIGNS:  Blood pressure (!) 197/84, pulse 60, temperature 98.4 F (36.9 C), temperature source Oral, resp. rate 18, height 5\' 7"  (1.702 m), weight 116.1 kg (256 lb), SpO2 99 %. Blood pressure right before this was 133/68.   PHYSICAL EXAMINATION:  GENERAL:  82 y.o.-year-old patient  lying in the bed with no acute distress.  EYES: Pupils equal, round, reactive to light and accommodation. No scleral icterus. Extraocular muscles intact.  HEENT: Head atraumatic, normocephalic. Oropharynx and nasopharynx clear.  NECK:  Supple, no jugular venous distention. No thyroid enlargement, no tenderness.  LUNGS: Normal breath sounds bilaterally, no wheezing, rales,rhonchi or crepitation. No use of accessory muscles of respiration.  CARDIOVASCULAR: S1, S2 normal. No murmurs, rubs, or gallops.  ABDOMEN: Soft, non-tender, non-distended. Bowel sounds present. No organomegaly or mass.  EXTREMITIES: Trace edema, no cyanosis, or clubbing.  NEUROLOGIC: Cranial nerves II through XII are intact. Muscle strength 5/5 in all extremities. Sensation intact. Gait not checked.  PSYCHIATRIC: The patient is alert and answers questions appropriately.  SKIN: No obvious rash, lesion, or ulcer.   DATA REVIEW:   CBC  Recent Labs Lab 05/30/17 0339  WBC 10.6  HGB 11.0*  HCT 31.7*  PLT 261    Chemistries   Recent Labs Lab 05/30/17 0339  NA 134*  K 4.6  CL 102  CO2 24  GLUCOSE 119*  BUN 52*  CREATININE 2.01*  CALCIUM 8.6*    Cardiac Enzymes  Recent Labs Lab 05/28/17 0747  TROPONINI <0.03    Microbiology Results  Results for orders placed or performed during the hospital encounter of 05/27/17  Urine Culture     Status: Abnormal   Collection Time: 05/29/17  4:08 PM  Result Value Ref Range Status   Specimen Description URINE, CLEAN CATCH  Final   Special Requests NONE Normal  Final   Culture MULTIPLE SPECIES PRESENT, SUGGEST RECOLLECTION (A)  Final   Report Status 05/31/2017 FINAL  Final    RADIOLOGY:  Mr Lumbar Spine Wo Contrast  Result Date: 05/29/2017 CLINICAL DATA:  Left buttock, lateral hip, and thigh pain radiating to the knee. EXAM: MRI LUMBAR SPINE WITHOUT CONTRAST TECHNIQUE: Multiplanar, multisequence MR imaging of the lumbar spine was performed. No intravenous contrast  was administered. COMPARISON:  CT abdomen 12/01/2016 FINDINGS: Body habitus reduces diagnostic sensitivity and specificity. Segmentation: The lowest lumbar type non-rib-bearing vertebra is labeled as L5. Alignment: 4 mm degenerative anterolisthesis at L4-5, no pars defects. Vertebrae:  Congenitally short pedicles in the lumbar spine. Disc desiccation at all levels between L2 and L5. Degenerative facet  edema bilaterally at the L4-5 level. Conus medullaris: Extends to the upper L2 level and appears normal. Paraspinal and other soft tissues: Fluid signal intensity lesions associated with the right kidney are faintly seen and most compatible with cysts. At least 1 similar lesion on the left. Interspinous edema at the L4-5 and L5-S1 levels on image 9/4. Disc levels: L1-2:  No impingement, mild disc bulge. L2-3: Prominent central narrowing of the thecal sac with moderate bilateral foraminal stenosis, mild-to-moderate bilateral subarticular lateral recess stenosis, and mild displacement of the right L2 nerve in the lateral extraforaminal space due to congenitally short pedicles, disc bulge, facet arthropathy, and ligamentum flavum redundancy. Cross-sectional area of the thecal sac 0.6 cm^2. L3-4: Prominent central narrowing of the thecal sac with moderate bilateral foraminal stenosis, mild to moderate bilateral subarticular lateral recess stenosis, and mild displacement of both L3 nerves in the lateral extraforaminal space due to congenitally short pedicles, disc bulge, intervertebral spurring, facet arthropathy, and ligamentum flavum redundancy. Cross-sectional area of the thecal sac 0.6 cm^2. L4-5: Prominent central narrowing of the thecal sac with moderate bilateral foraminal stenosis, moderate bilateral subarticular lateral recess stenosis, and mild displacement of both L4 nerves in the lateral extraforaminal space due to congenitally short pedicles, disc bulge, intervertebral spurring, disc uncovering, and facet  arthropathy. Cross-sectional area of the thecal sac 0.6 cm^ 2. L5-S1: Mild left and borderline right foraminal stenosis due to facet arthropathy, disc bulge, and mild intervertebral spurring. IMPRESSION: 1. Congenitally short pedicles, lumbar spondylosis, and degenerative disc disease causing prominent impingement at L2- 3, L3-4, and L4-5; and mild impingement at L5-S1, as detailed above. 2. 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 3. Degenerative facet edema bilaterally at L4-5. 4. 4 mm of degenerative anterolisthesis at L4-5. 5. Suspected bilateral renal cysts. These are only partially seen on today's exam but were also present on the CT scan from December 2017. Electronically Signed   By: Van Clines M.D.   On: 05/29/2017 19:23   US Venous Img Lower Unilateral Left  Result Date: 05/29/2017 CLINICAL DATA:  Pain for 6 weeks in the left leg. EXAM: LEFT LOWER EXTREMITY VENOUS DOPPLER ULTRASOUND TECHNIQUE: Gray-scale sonography with graded compression, as well as color Doppler and duplex ultrasound were performed to evaluate the lower extremity deep venous systems from the level of the common femoral vein and including the common femoral, femoral, profunda femoral, popliteal and calf veins including the posterior tibial, peroneal and gastrocnemius veins when visible. The superficial great saphenous vein was also interrogated. Spectral Doppler was utilized to evaluate flow at rest and with distal augmentation maneuvers in the common femoral, femoral and popliteal veins. COMPARISON:  None. FINDINGS: Contralateral Common Femoral Vein: Respiratory phasicity is normal and symmetric with the symptomatic side. No evidence of thrombus. Normal compressibility. Common Femoral Vein: No evidence of thrombus. Saphenofemoral Junction: No evidence of thrombus. Profunda Femoral Vein: No evidence of thrombus. Femoral Vein: No evidence of thrombus. Popliteal Vein: No evidence of thrombus. Calf  Veins: No evidence of thrombus. IMPRESSION: No evidence of DVT within the left lower extremity. Electronically Signed   By: Monte Fantasia M.D.   On: 05/29/2017 10:19      Management plans discussed with the patient, family and they are in agreement.  CODE STATUS:     Code Status Orders        Start     Ordered   05/27/17 1923  Full code  Continuous     05/27/17 1923    Code  Status History    Date Active Date Inactive Code Status Order ID Comments User Context   12/01/2016  8:42 PM 12/03/2016  4:05 PM Full Code [id number]  Vaughan Basta, MD Inpatient   09/12/2016  8:42 AM 09/13/2016  8:30 PM Full Code [id number]  Alonna Buckler, RN Inpatient   09/11/2016  8:46 AM 09/11/2016  4:59 PM Full Code [id number]  Dustin Flock, MD ED   08/31/2016  3:33 AM 09/06/2016  5:14 PM Full Code [id number]  Saundra Shelling, MD Inpatient    Advance Directive Documentation     Most Recent Value  Type of Advance Directive  Healthcare Power of [place], Living will  Pre-existing out of facility DNR order (yellow form or pink MOST form)  -  "MOST" Form in Place?  -      TOTAL TIME TAKING CARE OF THIS PATIENT: 35 minutes.    Loletha Grayer M.D on 05/31/2017 at 8:42 AM  Between 7am to 6pm - Pager - [phone]  After 6pm go to [email or web address] - password [email or web address] [facility]  [phone]  CC: Primary care physician; Marguerita Merles, MD

## 2017-05-31 NOTE — Progress Notes (Signed)
Patient is medically stable for D/C to [facility] today. Per [facility] admissions coordinator at [facility] patient can come today to private room 108. RN will call report and arrange EMS for transport. Clinical Education officer, museum (CSW) sent D/C orders to [facility] via [place]. Patient is aware of above. CSW contacted patient's daughter [name] and made her aware of above. Per [name] she is going to [facility] between 10 am and 11 am today to complete admissions paper work. Please reconsult if future social work needs arise. CSW signing off.   [employer], LCSW [phone]

## 2017-05-31 NOTE — Progress Notes (Addendum)
Called report to Nurse Randell Patient at [facility]. EMS called for transport. Patient VS stable, PIV removed. No complaints os pain at this time. Discharge instructions gone over with patient at this time. No concerns voiced. Patient had bowel movement this morning after Suppository was given.

## 2017-06-27 ENCOUNTER — Telehealth: Payer: Self-pay | Admitting: Cardiovascular Disease

## 2017-06-27 NOTE — Telephone Encounter (Signed)
3 attempts to schedule fu appt from recall list.   Deleting recall.  4 month fu per ckout 11/23/16  [name]

## 2017-07-06 ENCOUNTER — Telehealth: Payer: Self-pay | Admitting: Cardiovascular Disease

## 2017-07-06 NOTE — Telephone Encounter (Signed)
3 attempts to schedule fu appt from recall list.   Deleting recall.  4 month fu per ckout 11/23/16 arida

## 2017-07-31 ENCOUNTER — Other Ambulatory Visit: Payer: Self-pay

## 2017-07-31 ENCOUNTER — Emergency Department: Payer: Medicare Other

## 2017-07-31 ENCOUNTER — Encounter: Payer: Self-pay | Admitting: Emergency Medicine

## 2017-07-31 ENCOUNTER — Inpatient Hospital Stay (HOSPITAL_COMMUNITY)
Admit: 2017-07-31 | Discharge: 2017-07-31 | Disposition: A | Discharge status: Home / Self Care | Payer: Medicare Other | Attending: Internal Medicine | Admitting: Internal Medicine

## 2017-07-31 ENCOUNTER — Inpatient Hospital Stay
Admission: EM | Admit: 2017-07-31 | Discharge: 2017-08-02 | DRG: 311 | Disposition: A | Discharge status: Home / Self Care | Payer: Medicare Other | Attending: Specialist | Admitting: Specialist

## 2017-07-31 DIAGNOSIS — Z96641 Presence of right artificial hip joint: Secondary | ICD-10-CM | POA: Diagnosis present

## 2017-07-31 DIAGNOSIS — I350 Nonrheumatic aortic (valve) stenosis: Secondary | ICD-10-CM

## 2017-07-31 DIAGNOSIS — I10 Essential (primary) hypertension: Secondary | ICD-10-CM

## 2017-07-31 DIAGNOSIS — I248 Other forms of acute ischemic heart disease: Principal | ICD-10-CM | POA: Diagnosis present

## 2017-07-31 DIAGNOSIS — R748 Abnormal levels of other serum enzymes: Secondary | ICD-10-CM | POA: Diagnosis not present

## 2017-07-31 DIAGNOSIS — Z955 Presence of coronary angioplasty implant and graft: Secondary | ICD-10-CM | POA: Diagnosis not present

## 2017-07-31 DIAGNOSIS — Z87891 Personal history of nicotine dependence: Secondary | ICD-10-CM

## 2017-07-31 DIAGNOSIS — I5042 Chronic combined systolic (congestive) and diastolic (congestive) heart failure: Secondary | ICD-10-CM | POA: Diagnosis present

## 2017-07-31 DIAGNOSIS — R109 Unspecified abdominal pain: Secondary | ICD-10-CM

## 2017-07-31 DIAGNOSIS — J439 Emphysema, unspecified: Secondary | ICD-10-CM | POA: Diagnosis present

## 2017-07-31 DIAGNOSIS — I739 Peripheral vascular disease, unspecified: Secondary | ICD-10-CM | POA: Diagnosis present

## 2017-07-31 DIAGNOSIS — R531 Weakness: Secondary | ICD-10-CM

## 2017-07-31 DIAGNOSIS — Z79899 Other long term (current) drug therapy: Secondary | ICD-10-CM

## 2017-07-31 DIAGNOSIS — I2510 Atherosclerotic heart disease of native coronary artery without angina pectoris: Secondary | ICD-10-CM | POA: Diagnosis present

## 2017-07-31 DIAGNOSIS — I130 Hypertensive heart and chronic kidney disease with heart failure and stage 1 through stage 4 chronic kidney disease, or unspecified chronic kidney disease: Secondary | ICD-10-CM | POA: Diagnosis present

## 2017-07-31 DIAGNOSIS — Z7951 Long term (current) use of inhaled steroids: Secondary | ICD-10-CM | POA: Diagnosis not present

## 2017-07-31 DIAGNOSIS — R079 Chest pain, unspecified: Secondary | ICD-10-CM | POA: Diagnosis present

## 2017-07-31 DIAGNOSIS — G629 Polyneuropathy, unspecified: Secondary | ICD-10-CM | POA: Diagnosis present

## 2017-07-31 DIAGNOSIS — R778 Other specified abnormalities of plasma proteins: Secondary | ICD-10-CM | POA: Diagnosis present

## 2017-07-31 DIAGNOSIS — E039 Hypothyroidism, unspecified: Secondary | ICD-10-CM | POA: Diagnosis present

## 2017-07-31 DIAGNOSIS — Z8546 Personal history of malignant neoplasm of prostate: Secondary | ICD-10-CM

## 2017-07-31 DIAGNOSIS — Z8249 Family history of ischemic heart disease and other diseases of the circulatory system: Secondary | ICD-10-CM | POA: Diagnosis not present

## 2017-07-31 DIAGNOSIS — K5900 Constipation, unspecified: Secondary | ICD-10-CM | POA: Diagnosis present

## 2017-07-31 DIAGNOSIS — Z9119 Patient's noncompliance with other medical treatment and regimen: Secondary | ICD-10-CM | POA: Diagnosis not present

## 2017-07-31 DIAGNOSIS — Z7982 Long term (current) use of aspirin: Secondary | ICD-10-CM

## 2017-07-31 DIAGNOSIS — I5032 Chronic diastolic (congestive) heart failure: Secondary | ICD-10-CM | POA: Diagnosis not present

## 2017-07-31 DIAGNOSIS — I214 Non-ST elevation (NSTEMI) myocardial infarction: Secondary | ICD-10-CM

## 2017-07-31 DIAGNOSIS — N183 Chronic kidney disease, stage 3 (moderate): Secondary | ICD-10-CM | POA: Diagnosis present

## 2017-07-31 DIAGNOSIS — Z8349 Family history of other endocrine, nutritional and metabolic diseases: Secondary | ICD-10-CM | POA: Diagnosis not present

## 2017-07-31 DIAGNOSIS — I959 Hypotension, unspecified: Secondary | ICD-10-CM | POA: Diagnosis present

## 2017-07-31 DIAGNOSIS — Z888 Allergy status to other drugs, medicaments and biological substances status: Secondary | ICD-10-CM

## 2017-07-31 DIAGNOSIS — R7989 Other specified abnormal findings of blood chemistry: Secondary | ICD-10-CM | POA: Diagnosis present

## 2017-07-31 DIAGNOSIS — I25118 Atherosclerotic heart disease of native coronary artery with other forms of angina pectoris: Secondary | ICD-10-CM | POA: Diagnosis not present

## 2017-07-31 DIAGNOSIS — E785 Hyperlipidemia, unspecified: Secondary | ICD-10-CM | POA: Diagnosis present

## 2017-07-31 DIAGNOSIS — G4733 Obstructive sleep apnea (adult) (pediatric): Secondary | ICD-10-CM | POA: Diagnosis present

## 2017-07-31 DIAGNOSIS — R32 Unspecified urinary incontinence: Secondary | ICD-10-CM | POA: Diagnosis present

## 2017-07-31 DIAGNOSIS — E669 Obesity, unspecified: Secondary | ICD-10-CM | POA: Diagnosis present

## 2017-07-31 DIAGNOSIS — Z6839 Body mass index (BMI) 39.0-39.9, adult: Secondary | ICD-10-CM

## 2017-07-31 HISTORY — DX: Chronic combined systolic (congestive) and diastolic (congestive) heart failure: I50.42

## 2017-07-31 HISTORY — DX: Chronic kidney disease, stage 3 unspecified: N18.30

## 2017-07-31 HISTORY — DX: Chronic kidney disease, stage 3 (moderate): N18.3

## 2017-07-31 LAB — CBC
HCT: 34 % — ABNORMAL LOW (ref 40.0–52.0)
HEMOGLOBIN: 11.7 g/dL — AB (ref 13.0–18.0)
MCH: 28.8 pg (ref 26.0–34.0)
MCHC: 34.4 g/dL (ref 32.0–36.0)
MCV: 83.7 fL (ref 80.0–100.0)
PLATELETS: 258 10*3/uL (ref 150–440)
RBC: 4.07 MIL/uL — ABNORMAL LOW (ref 4.40–5.90)
RDW: 16.7 % — ABNORMAL HIGH (ref 11.5–14.5)
WBC: 8.6 10*3/uL (ref 3.8–10.6)

## 2017-07-31 LAB — URINALYSIS, COMPLETE (UACMP) WITH MICROSCOPIC
BILIRUBIN URINE: NEGATIVE
GLUCOSE, UA: NEGATIVE mg/dL
KETONES UR: 5 mg/dL — AB
Leukocytes, UA: NEGATIVE
NITRITE: NEGATIVE
PH: 5 (ref 5.0–8.0)
PROTEIN: 100 mg/dL — AB
Specific Gravity, Urine: 1.013 (ref 1.005–1.030)

## 2017-07-31 LAB — COMPREHENSIVE METABOLIC PANEL
ALBUMIN: 3.7 g/dL (ref 3.5–5.0)
ALK PHOS: 120 U/L (ref 38–126)
ALT: 82 U/L — ABNORMAL HIGH (ref 17–63)
ANION GAP: 14 (ref 5–15)
AST: 147 U/L — ABNORMAL HIGH (ref 15–41)
BILIRUBIN TOTAL: 1.9 mg/dL — AB (ref 0.3–1.2)
BUN: 38 mg/dL — ABNORMAL HIGH (ref 6–20)
CALCIUM: 9 mg/dL (ref 8.9–10.3)
CO2: 21 mmol/L — ABNORMAL LOW (ref 22–32)
Chloride: 95 mmol/L — ABNORMAL LOW (ref 101–111)
Creatinine, Ser: 2.23 mg/dL — ABNORMAL HIGH (ref 0.61–1.24)
GFR calc non Af Amer: 26 mL/min — ABNORMAL LOW (ref >60)
GFR, EST AFRICAN AMERICAN: 30 mL/min — AB (ref >60)
GLUCOSE: 100 mg/dL — AB (ref 65–99)
Potassium: 5 mmol/L (ref 3.5–5.1)
Sodium: 130 mmol/L — ABNORMAL LOW (ref 135–145)
TOTAL PROTEIN: 6.7 g/dL (ref 6.5–8.1)

## 2017-07-31 LAB — ECHOCARDIOGRAM COMPLETE
HEIGHTINCHES: 67 in
Weight: 4080 oz

## 2017-07-31 LAB — TROPONIN I
Troponin I: 0.13 ng/mL (ref <0.03)
Troponin I: 0.1 ng/mL (ref <0.03)
Troponin I: 0.09 ng/mL (ref <0.03)

## 2017-07-31 LAB — HEPARIN LEVEL (UNFRACTIONATED): HEPARIN UNFRACTIONATED: 0.3 [IU]/mL (ref 0.30–0.70)

## 2017-07-31 LAB — APTT: aPTT: 27 seconds (ref 24–36)

## 2017-07-31 LAB — PROTIME-INR
INR: 1.04
PROTHROMBIN TIME: 13.6 s (ref 11.4–15.2)

## 2017-07-31 LAB — LIPASE, BLOOD: Lipase: 48 U/L (ref 11–51)

## 2017-07-31 LAB — BRAIN NATRIURETIC PEPTIDE: B Natriuretic Peptide: 457 pg/mL — ABNORMAL HIGH (ref 0.0–100.0)

## 2017-07-31 MED ORDER — GABAPENTIN 100 MG PO CAPS
100.0000 mg | ORAL_CAPSULE | Freq: Three times a day (TID) | ORAL | Status: DC
  Administered 2017-07-31 – 2017-08-02 (×7): 100 mg via ORAL
  Filled 2017-07-31 (×7): qty 1

## 2017-07-31 MED ORDER — SODIUM CHLORIDE 0.9% FLUSH: 3.0000 mL | INTRAVENOUS | Status: DC | PRN

## 2017-07-31 MED ORDER — LEVOTHYROXINE SODIUM 50 MCG PO TABS
50.0000 ug | ORAL_TABLET | Freq: Every day | ORAL | Status: DC
  Administered 2017-08-01 – 2017-08-02 (×2): 50 ug via ORAL
  Filled 2017-07-31 (×2): qty 1

## 2017-07-31 MED ORDER — ASPIRIN EC 81 MG PO TBEC: 81.0000 mg | DELAYED_RELEASE_TABLET | ORAL | Status: DC

## 2017-07-31 MED ORDER — ONDANSETRON HCL 4 MG/2ML IJ SOLN
4.0000 mg | Freq: Four times a day (QID) | INTRAMUSCULAR | Status: DC | PRN
  Administered 2017-07-31 – 2017-08-02 (×4): 4 mg via INTRAVENOUS
  Filled 2017-07-31 (×4): qty 2

## 2017-07-31 MED ORDER — METOPROLOL TARTRATE 50 MG PO TABS
50.0000 mg | ORAL_TABLET | Freq: Once | ORAL | Status: AC
  Administered 2017-07-31: 50 mg via ORAL
  Filled 2017-07-31: qty 1

## 2017-07-31 MED ORDER — ISOSORBIDE MONONITRATE ER 60 MG PO TB24
60.0000 mg | ORAL_TABLET | Freq: Every day | ORAL | Status: DC
  Administered 2017-08-02: 60 mg via ORAL
  Filled 2017-07-31: qty 1

## 2017-07-31 MED ORDER — ACETAMINOPHEN 325 MG PO TABS: 650.0000 mg | ORAL_TABLET | ORAL | Status: DC | PRN

## 2017-07-31 MED ORDER — HEPARIN BOLUS VIA INFUSION
4000.0000 [IU] | Freq: Once | INTRAVENOUS | Status: AC
Start: 2017-07-31 — End: 2017-07-31
  Administered 2017-07-31: 4000 [IU] via INTRAVENOUS
  Filled 2017-07-31: qty 4000

## 2017-07-31 MED ORDER — FLUTICASONE PROPIONATE 50 MCG/ACT NA SUSP
1.0000 | Freq: Every day | NASAL | Status: DC
  Administered 2017-08-01 – 2017-08-02 (×2): 1 via NASAL
  Filled 2017-07-31: qty 16

## 2017-07-31 MED ORDER — SODIUM CHLORIDE 0.9 % IV SOLN: 250.0000 mL | INTRAVENOUS | Status: DC | PRN

## 2017-07-31 MED ORDER — NITROGLYCERIN 0.4 MG SL SUBL
0.4000 mg | SUBLINGUAL_TABLET | SUBLINGUAL | Status: DC | PRN
  Administered 2017-07-31 (×3): 0.4 mg via SUBLINGUAL
  Filled 2017-07-31: qty 1

## 2017-07-31 MED ORDER — SODIUM CHLORIDE 0.9% FLUSH
3.0000 mL | Freq: Two times a day (BID) | INTRAVENOUS | Status: DC
  Administered 2017-07-31 – 2017-08-02 (×4): 3 mL via INTRAVENOUS

## 2017-07-31 MED ORDER — ASPIRIN 81 MG PO CHEW: 324.0000 mg | CHEWABLE_TABLET | ORAL | Status: AC

## 2017-07-31 MED ORDER — ASPIRIN 300 MG RE SUPP: 300.0000 mg | RECTAL | Status: AC

## 2017-07-31 MED ORDER — NITROGLYCERIN 0.4 MG SL SUBL
SUBLINGUAL_TABLET | SUBLINGUAL | Status: AC
  Filled 2017-07-31: qty 1

## 2017-07-31 MED ORDER — HEPARIN (PORCINE) IN NACL 100-0.45 UNIT/ML-% IJ SOLN
1300.0000 [IU]/h | INTRAMUSCULAR | Status: DC
  Administered 2017-07-31: 1300 [IU]/h via INTRAVENOUS
  Filled 2017-07-31 (×2): qty 250

## 2017-07-31 MED ORDER — TIOTROPIUM BROMIDE MONOHYDRATE 18 MCG IN CAPS
18.0000 ug | ORAL_CAPSULE | Freq: Every day | RESPIRATORY_TRACT | Status: DC
  Administered 2017-08-01 – 2017-08-02 (×2): 18 ug via RESPIRATORY_TRACT
  Filled 2017-07-31: qty 5

## 2017-07-31 MED ORDER — ASPIRIN EC 81 MG PO TBEC
81.0000 mg | DELAYED_RELEASE_TABLET | Freq: Every day | ORAL | Status: DC
  Administered 2017-08-01 – 2017-08-02 (×2): 81 mg via ORAL
  Filled 2017-07-31 (×2): qty 1

## 2017-07-31 MED ORDER — ATORVASTATIN CALCIUM 20 MG PO TABS
40.0000 mg | ORAL_TABLET | Freq: Every day | ORAL | Status: DC
  Administered 2017-08-01 – 2017-08-02 (×2): 40 mg via ORAL
  Filled 2017-07-31 (×2): qty 2

## 2017-07-31 MED ORDER — METOPROLOL TARTRATE 50 MG PO TABS
50.0000 mg | ORAL_TABLET | Freq: Two times a day (BID) | ORAL | Status: DC
  Administered 2017-07-31 – 2017-08-01 (×2): 50 mg via ORAL
  Filled 2017-07-31 (×2): qty 1

## 2017-07-31 MED ORDER — MAGNESIUM OXIDE 400 (241.3 MG) MG PO TABS
400.0000 mg | ORAL_TABLET | Freq: Two times a day (BID) | ORAL | Status: DC
  Administered 2017-07-31 – 2017-08-02 (×4): 400 mg via ORAL
  Filled 2017-07-31 (×4): qty 1

## 2017-07-31 MED ORDER — MOMETASONE FURO-FORMOTEROL FUM 200-5 MCG/ACT IN AERO
2.0000 | INHALATION_SPRAY | Freq: Two times a day (BID) | RESPIRATORY_TRACT | Status: DC
  Administered 2017-07-31 – 2017-08-02 (×4): 2 via RESPIRATORY_TRACT
  Filled 2017-07-31: qty 8.8

## 2017-07-31 MED ORDER — ALBUTEROL SULFATE (2.5 MG/3ML) 0.083% IN NEBU: 3.0000 mL | INHALATION_SOLUTION | Freq: Four times a day (QID) | RESPIRATORY_TRACT | Status: DC | PRN

## 2017-07-31 MED ORDER — HYDRALAZINE HCL 50 MG PO TABS
100.0000 mg | ORAL_TABLET | Freq: Three times a day (TID) | ORAL | Status: DC
  Administered 2017-07-31 – 2017-08-02 (×4): 100 mg via ORAL
  Filled 2017-07-31 (×4): qty 2

## 2017-07-31 MED ORDER — HYDRALAZINE HCL 50 MG PO TABS
ORAL_TABLET | ORAL | Status: AC
  Administered 2017-07-31: 100 mg
  Filled 2017-07-31: qty 2

## 2017-07-31 MED ORDER — LOSARTAN POTASSIUM 25 MG PO TABS
25.0000 mg | ORAL_TABLET | Freq: Every day | ORAL | Status: DC
  Administered 2017-08-01 – 2017-08-02 (×2): 25 mg via ORAL
  Filled 2017-07-31 (×2): qty 1

## 2017-07-31 MED ORDER — FUROSEMIDE 20 MG PO TABS
20.0000 mg | ORAL_TABLET | Freq: Two times a day (BID) | ORAL | Status: DC
  Administered 2017-07-31 – 2017-08-02 (×4): 20 mg via ORAL
  Filled 2017-07-31 (×4): qty 1

## 2017-07-31 NOTE — Consult Note (Signed)
Cardiology Consult    Patient ID: [name] MRN: [id number], DOB/AGE: [birth year]   Admit date: 07/31/2017 Date of Consult: 07/31/2017  Primary Physician: Marguerita Merles, MD Primary Cardiologist: Jerilynn Mages. Fletcher Anon, MD  Requesting Provider: Neta Mends, MD  Patient Profile    [name] is a 82 y.o. male with a history of CAD, diast CHF, HTN, HL, PVD, COPD, PVD, bipolar d/o, and obesity who is being seen today for the evaluation of chest pain and dyspnea at the request of Dr. Estanislado Pandy.  Past Medical History   Past Medical History:  Diagnosis Date  . Alcoholism ([place])   . Asthma   . Bipolar affective disorder ([place])   . Chronic combined systolic and diastolic CHF (congestive heart failure) ([place])    a. 08/2016 Echo: EF 40-45%, mild AS, mild to mod MR, mildly dil LA/RA, mild-mod TR.  . CKD (chronic kidney disease), stage III   . COPD (chronic obstructive pulmonary disease) (HCC)    COPD  . Coronary artery disease    a. 01/2004 s/p PCI and Taxus drug-eluting stent placement to the distal RCA (3.5 x 12 mm).  . Degenerative joint disease   . Essential hypertension   . Hyperlipidemia   . Hypertension   . Prostate CA [facility])    prostate ca dx 20 yrs ago  . PVD (peripheral vascular disease) ([place])   . Tobacco abuse     Past Surgical History:  Procedure Laterality Date  . CARDIAC CATHETERIZATION    . CORONARY ANGIOPLASTY WITH STENT PLACEMENT    . TOTAL HIP ARTHROPLASTY     right    Allergies  Allergies  Allergen Reactions  . Amlodipine     Constipation   . Benazepril-Hydrochlorothiazide     Constipation   . Chlorthalidone     Hyponatremia  . Other Other (See Comments)    "ANY BLOOD PRESSURE MEDICATIONS THAT I'VE TRIED" - PT. DOES NOT REMEMBER WHICH ONES    History of Present Illness    82 y/o ? with the above complex PMH including CAD s/p DES to the RCA in 2005, HTN, HL, combined CHF, CKD III, obesity, bipolar d/o, PVD, and COPD.  He lives locally and is fairly sedentary.  He  was admitted to [facility] in June w/ syncope, hyperkalemia, dyspnea, and AKI (following left knee steroid injection).  CE negative @ the time.  He was seen by nephrology and placed back on lasix.  He was subsequently d/c'd.  He has been having low back pain and has been eval by ortho.  He was in his USOH until after lumbar injection on 8/8, when later in the day, he began to note sluggishness and fatigue.  This persisted over the subsequent 5 days and beginning about 2-3 days ago, he also began to experience a constant 7/10 substernal chest discomfort associated with dyspnea.  He also has noted intermittent 'skipped heart beats' and lightheadedness.  C/p, dyspnea, and fatigue have been constant since 8/8, and beginning on 8/13, he noted bilateral leg pain w/o swelling.  This AM, all symptoms persisted and he also noted left sided scapular pain.   Due to ongoing Ss, he presented to the ED this am.  There, ECG was non-acute however troponin was elevated @ 0.13 (f/u 0.10).  Creat elevated @ 2.23, which is similar to 05/2017.  He was hypertensive, up to 197/87 and was treated with PO hydralazine and metoprolol with improvement in BPs.  He was admitted for further eval.  He  says that c/p and dyspnea resolved in the ED.  He denies pnd, n, v, syncope, edema, weight gain, or early satiety.  We have been asked to eval.  Inpatient Medications    . aspirin  324 mg Oral NOW   Or  . aspirin  300 mg Rectal NOW  . [START ON 08/01/2017] aspirin EC  81 mg Oral Daily  . atorvastatin  40 mg Oral Daily  . fluticasone  1 spray Each Nare Daily  . furosemide  20 mg Oral BID  . gabapentin  100 mg Oral TID  . hydrALAZINE  100 mg Oral TID WC & HS  . isosorbide mononitrate  60 mg Oral Daily  . [START ON 08/01/2017] levothyroxine  50 mcg Oral QAC breakfast  . losartan  25 mg Oral Daily  . magnesium oxide  400 mg Oral BID  . metoprolol tartrate  50 mg Oral BID  . mometasone-formoterol  2 puff Inhalation BID  . sodium chloride flush   3 mL Intravenous Q12H  . tiotropium  18 mcg Inhalation Daily    Family History    Family History  Problem Relation Age of Onset  . Hypertension Mother   . Hyperlipidemia Mother   . Heart attack Mother   . Hypertension Father   . Hyperlipidemia Father   . Heart attack Father     Social History    Social History   Social History  . Marital status: Legally Separated    Spouse name: N/A  . Number of children: N/A  . Years of education: N/A   Occupational History  . retired    Social History Main Topics  . Smoking status: Former Smoker    Packs/day: 1.00    Years: 50.00    Types: Cigarettes  . Smokeless tobacco: Never Used  . Alcohol use Yes     Comment: social   . Drug use: No  . Sexual activity: No   Other Topics Concern  . Not on file   Social History Narrative  . No narrative on file     Review of Systems    General:  +++ chills/cold sweats, no fever, night sweats or weight changes. +++ fatigue x 5 days. Cardiovascular:  +++ constant chest pain, +++ dyspnea on exertion, no edema, +++ orthopnea, +++ palpitations, no paroxysmal nocturnal dyspnea. Dermatological: No rash, lesions/masses Respiratory: No cough, +++ dyspnea Urologic: No hematuria, dysuria Abdominal:   No nausea, vomiting, diarrhea, bright red blood per rectum, melena, or hematemesis Neurologic:  No visual changes, +++ wkns, changes in mental status. All other systems reviewed and are otherwise negative except as noted above.  Physical Exam    Blood pressure (!) 129/58, pulse 71, temperature 98.1 F (36.7 C), temperature source Oral, resp. rate 18, height 5\' 7"  (1.702 m), weight 255 lb (115.7 kg), SpO2 100 %.  General: Pleasant, NAD Psych: Normal affect. Neuro: Alert and oriented X 3. Moves all extremities spontaneously. HEENT: Normal  Neck: Supple without bruits.  Obese, difficult to gauge JVP. Lungs:  Resp regular and unlabored, exp wheezing throughout. Heart: RRR, distant, no s3, s4, or  murmurs. Abdomen: Soft, obese, non-tender, non-distended, BS + x 4.  Extremities: No clubbing, cyanosis or edema. DP/PT/Radials 2+ and equal bilaterally.  Labs      Recent Labs  07/31/17 0849 07/31/17 1315  TROPONINI 0.13* 0.10*   Lab Results  Component Value Date   WBC 8.6 07/31/2017   HGB 11.7 (L) 07/31/2017   HCT 34.0 (L) 07/31/2017  MCV 83.7 07/31/2017   PLT 258 07/31/2017     Recent Labs Lab 07/31/17 0849  NA 130*  K 5.0  CL 95*  CO2 21*  BUN 38*  CREATININE 2.23*  CALCIUM 9.0  PROT 6.7  BILITOT 1.9*  ALKPHOS 120  ALT 82*  AST 147*  GLUCOSE 100*   Lab Results  Component Value Date   CHOL 189 04/26/2016   HDL 66 04/26/2016   LDLCALC 97 04/26/2016   TRIG 129 04/26/2016     Radiology Studies    Dg Chest 2 View  Result Date: 07/31/2017 CLINICAL DATA:  Chest pain abdominal pain EXAM: CHEST  2 VIEW COMPARISON:  05/27/2017 FINDINGS: COPD with hyperinflation of the lungs. Prominent lung markings likely due to mild scarring unchanged. Negative for heart failure. No change from the prior study. Atherosclerotic aortic arch. Streaky lung markings in the lingula have developed since the prior study, possible pneumonia or atelectasis IMPRESSION: COPD Lingular airspace disease may represent atelectasis or pneumonia. Electronically Signed   By: Franchot Gallo M.D.   On: 07/31/2017 08:34   Dg Abd 2 Views  Result Date: 07/31/2017 CLINICAL DATA:  Nausea, vomiting, bloating EXAM: ABDOMEN - 2 VIEW COMPARISON:  None. FINDINGS: There is no bowel dilatation to suggest obstruction. There is no evidence of pneumoperitoneum, portal venous gas or pneumatosis. There are no pathologic calcifications along the expected course of the ureters. Right total hip arthroplasty without failure complication. Prior left hip ORIF without hardware failure or complication. IMPRESSION: Negative. Electronically Signed   By: Kathreen Devoid   On: 07/31/2017 09:34    ECG & Cardiac Imaging    RSR, 82,  LAD, LVH.  Assessment & Plan    1.  Midsternal chest pain/Troponin elevation/CAD: pt w/ a prior h/o CAD s/p PCI/DES to the RCA in 2005.  He has not required cardiac eval since then.  He has been having lower back pain and on 8/8, he underwent lumbar injection/epidural in the setting of spinal stenosis and lumbar radiculitis.  Following injection, he later developed profound fatigue and within 2-3 days, constant c/p, dyspnea, palpitations, and intermittent lightheadedness.  Due to constant symptoms, he presented to the ED today.  There, ecg was non-acute.  CXR showed lingular ASD - ? Pna.  Troponin was elevated @ 0.13 and has since come down to 0.10.  He is currently c/p free.  Plan to trend troponin and f/u echo.  If troponin trend flat and echo w/ stable LV fxn/dysfxn, we will consider myoview stress testing.  He is not a strong cath candidate 2/2 CKD III with admission creat of 2.23.  Cont heparin for now.  Cont asa, statin,  blocker, ARB, and nitrate.  2.  Essential HTN:  BP elevated in ED - much improved now.  Cont  blocker, ARB, hydral/nitrate.  Titrate as needed.  3.  HL: cont statin rx.  LDL 97 in 04/2016.  4.  Morbid Obesity: would benefit from nutritional counseling.  5.  COPD/? PNA:  Wheezing on exam.  ? PNA on cxr. Afebrile. Nl wbc.  Inhalers per IM.  Signed, Murray Hodgkins, NP 07/31/2017, 2:55 PM

## 2017-07-31 NOTE — ED Triage Notes (Signed)
N/V abd pain bloating /gas , x4 day , lack of appetite, chest pain

## 2017-07-31 NOTE — ED Notes (Addendum)
Admitting MD aware of BP 

## 2017-07-31 NOTE — ED Notes (Signed)
Attempting to call report at this time. 

## 2017-07-31 NOTE — H&P (Signed)
[facility] NAME: [name]    MR#:  [id number]  DATE OF BIRTH:  05-26-1935  DATE OF ADMISSION:  07/31/2017  PRIMARY CARE PHYSICIAN: Marguerita Merles, MD   REQUESTING/REFERRING PHYSICIAN:   CHIEF COMPLAINT:   Chief Complaint  Patient presents with  . Abdominal Pain    HISTORY OF PRESENT ILLNESS: [name]  is a 82 y.o. male with a known history of bipolar disorder, congestive heart failure, COPD, coronary artery disease hypertension, hyperlipidemia, prostate cancer presented to the emergency room with generalized weakness and chest discomfort.the chest discomfort started yesterday night and was aching in nature. It was 2 out of 10 on a scale of 1-10. Patient also had an episode of shortness of breath. He was evaluated in the emergency room was found to have elevated troponin. Hospitalist service was consulted for further care.  PAST MEDICAL HISTORY:   Past Medical History:  Diagnosis Date  . Alcoholism ([place])   . Asthma   . Bipolar affective disorder ([place])   . CHF (congestive heart failure) ([place])   . COPD (chronic obstructive pulmonary disease) (HCC)    COPD  . Coronary artery disease February 2005   PCI and Taxus drug-eluting stent placement to the distal RCA (3.5 x 12 mm)  . Degenerative joint disease   . Essential hypertension   . Hyperlipidemia   . Hypertension   . Prostate CA [facility])    prostate ca dx 35 ys ago;  . Prostate CA [facility])    prostate ca dx 20 yrs ago  . PVD (peripheral vascular disease) ([place])   . Tobacco abuse     PAST SURGICAL HISTORY: Past Surgical History:  Procedure Laterality Date  . CARDIAC CATHETERIZATION    . CORONARY ANGIOPLASTY WITH STENT PLACEMENT    . TOTAL HIP ARTHROPLASTY     right    SOCIAL HISTORY:  Social History  Substance Use Topics  . Smoking status: Former Smoker    Packs/day: 1.00    Years: 50.00    Types: Cigarettes  . Smokeless tobacco: Never Used  . Alcohol use  Yes     Comment: social     FAMILY HISTORY:  Family History  Problem Relation Age of Onset  . Hypertension Mother   . Hyperlipidemia Mother   . Heart attack Mother   . Hypertension Father   . Hyperlipidemia Father   . Heart attack Father     DRUG ALLERGIES:  Allergies  Allergen Reactions  . Amlodipine     Constipation   . Benazepril-Hydrochlorothiazide     Constipation   . Chlorthalidone     Hyponatremia  . Other Other (See Comments)    "ANY BLOOD PRESSURE MEDICATIONS THAT I'VE TRIED" - PT. DOES NOT REMEMBER WHICH ONES    REVIEW OF SYSTEMS:   CONSTITUTIONAL: No fever, fatigue or weakness.  EYES: No blurred or double vision.  EARS, NOSE, AND THROAT: No tinnitus or ear pain.  RESPIRATORY: No cough, an episode of shortness of breath noted,  No wheezing or hemoptysis.  CARDIOVASCULAR: Has chest pain,  No orthopnea, edema.  GASTROINTESTINAL: No nausea, vomiting, diarrhea  Had mild abdominal pain.  GENITOURINARY: No dysuria, hematuria.  ENDOCRINE: No polyuria, nocturia,  HEMATOLOGY: No anemia, easy bruising or bleeding SKIN: No rash or lesion. MUSCULOSKELETAL: No joint pain or arthritis.   NEUROLOGIC: No tingling, numbness, weakness.  PSYCHIATRY: No anxiety or depression.   MEDICATIONS AT HOME:  Prior to Admission medications  Medication Sig Start Date End Date Taking? Authorizing Provider  albuterol (PROAIR HFA) 108 (90 Base) MCG/ACT inhaler Inhale 2 puffs into the lungs every 6 (six) hours as needed for wheezing or shortness of breath. Reported on 04/21/2016 05/31/17 10/05/18 Yes Loletha Grayer, MD  aspirin EC 81 MG tablet Take 81 mg by mouth every morning.   Yes [provider]  atorvastatin (LIPITOR) 40 MG tablet Take 40 mg by mouth daily.   Yes [provider]  fluticasone (FLONASE) 50 MCG/ACT nasal spray Place 1 spray into both nostrils daily. 05/31/17  Yes Wieting, Richard, MD  fluticasone-salmeterol (ADVAIR HFA) 026-37 MCG/ACT inhaler Inhale  2 puffs into the lungs 2 (two) times daily.   Yes [provider]  furosemide (LASIX) 40 MG tablet Take 0.5 tablets (20 mg total) by mouth 2 (two) times daily. 09/13/16  Yes Dustin Flock, MD  isosorbide mononitrate (IMDUR) 60 MG 24 hr tablet Take 1 tablet (60 mg total) by mouth daily. 05/31/17  Yes Wieting, Richard, MD  ketoconazole (NIZORAL) 2 % shampoo Apply 1 application topically 3 (three) times a week.  11/21/16  Yes [provider]  levothyroxine (SYNTHROID, LEVOTHROID) 50 MCG tablet Take 50 mcg by mouth daily before breakfast.   Yes [provider]  losartan (COZAAR) 25 MG tablet Take 25 mg by mouth daily. 05/18/17  Yes [provider]  magnesium oxide (MAG-OX) 400 MG tablet Take 400 mg by mouth 2 (two) times daily.    Yes [provider]  metoprolol (LOPRESSOR) 50 MG tablet Take 1 tablet (50 mg total) by mouth 2 (two) times daily. 09/06/16  Yes Epifanio Lesches, MD  nitroGLYCERIN (NITROSTAT) 0.4 MG SL tablet Take 0.4 mg by mouth every 5 (five) minutes x 3 doses as needed for chest pain. As needed for chest pain 12/06/15  Yes [provider]  polyethylene glycol (MIRALAX) packet Take 17 g by mouth daily. 05/31/17  Yes Wieting, Richard, MD  tiotropium (SPIRIVA HANDIHALER) 18 MCG inhalation capsule Place 18 mcg into inhaler and inhale daily.   Yes [provider]  vitamin B-6 (PYRIDOXINE) 25 MG tablet Take 25 mg by mouth daily.   Yes [provider]  gabapentin (NEURONTIN) 100 MG capsule Take 1 capsule (100 mg total) by mouth 3 (three) times daily. Patient not taking: Reported on 07/31/2017 05/31/17   Loletha Grayer, MD  hydrALAZINE (APRESOLINE) 100 MG tablet Take 1 tablet (100 mg total) by mouth 4 (four) times daily -  with meals and at bedtime. Patient not taking: Reported on 07/31/2017 05/31/17   Loletha Grayer, MD  ondansetron (ZOFRAN) 4 MG tablet Take 1 tablet (4 mg total) by mouth every 8 (eight) hours as needed for  nausea or vomiting. Patient not taking: Reported on 07/31/2017 03/10/17   Rudene Re, MD  oxycodone (OXY-IR) 5 MG capsule Take 1 capsule (5 mg total) by mouth every 4 (four) hours as needed. Patient not taking: Reported on 07/31/2017 05/31/17   Loletha Grayer, MD  predniSONE (DELTASONE) 5 MG tablet 6 tabs po day1; 5 tabs po day2; 4 tabs po day3; 3 tabs po day4; 2 tabs po day5; 1 tab po day 6,7 Patient not taking: Reported on 07/31/2017 05/31/17   Loletha Grayer, MD      PHYSICAL EXAMINATION:   VITAL SIGNS: Blood pressure (!) 178/90, pulse 71, temperature 98.1 F (36.7 C), temperature source Oral, resp. rate 18, height 5\' 7"  (1.702 m), weight 115.7 kg (255 lb), SpO2 100 %.  GENERAL:  82 y.o.-year-old  patient lying in the bed with no acute distress.  EYES: Pupils equal, round, reactive to light and accommodation. No scleral icterus. Extraocular muscles intact.  HEENT: Head atraumatic, normocephalic. Oropharynx and nasopharynx clear.  NECK:  Supple, no jugular venous distention. No thyroid enlargement, no tenderness.  LUNGS: Normal breath sounds bilaterally, no wheezing, rales,rhonchi or crepitation. No use of accessory muscles of respiration.  CARDIOVASCULAR: S1, S2 normal. No murmurs, rubs, or gallops.  ABDOMEN: Soft, nontender, nondistended. Bowel sounds present. No organomegaly or mass.  EXTREMITIES: No pedal edema, cyanosis, or clubbing.  NEUROLOGIC: Cranial nerves II through XII are intact. Muscle strength 5/5 in all extremities. Sensation intact. Gait not checked.  PSYCHIATRIC: The patient is alert and oriented x 3.  SKIN: No obvious rash, lesion, or ulcer.   LABORATORY PANEL:   CBC  Recent Labs Lab 07/31/17 0849  WBC 8.6  HGB 11.7*  HCT 34.0*  PLT 258  MCV 83.7  MCH 28.8  MCHC 34.4  RDW 16.7*   ------------------------------------------------------------------------------------------------------------------  Chemistries   Recent Labs Lab 07/31/17 0849  NA  130*  K 5.0  CL 95*  CO2 21*  GLUCOSE 100*  BUN 38*  CREATININE 2.23*  CALCIUM 9.0  AST 147*  ALT 82*  ALKPHOS 120  BILITOT 1.9*   ------------------------------------------------------------------------------------------------------------------ estimated creatinine clearance is 32.1 mL/min (A) (by C-G formula based on SCr of 2.23 mg/dL (H)). ------------------------------------------------------------------------------------------------------------------ No results for input(s): TSH, T4TOTAL, T3FREE, THYROIDAB in the last 72 hours.  Invalid input(s): FREET3   Coagulation profile No results for input(s): INR, PROTIME in the last 168 hours. ------------------------------------------------------------------------------------------------------------------- No results for input(s): DDIMER in the last 72 hours. -------------------------------------------------------------------------------------------------------------------  Cardiac Enzymes  Recent Labs Lab 07/31/17 0849  TROPONINI 0.13*   ------------------------------------------------------------------------------------------------------------------ Invalid input(s): POCBNP  ---------------------------------------------------------------------------------------------------------------  Urinalysis    Component Value Date/Time   COLORURINE YELLOW (A) 07/31/2017 1025   APPEARANCEUR CLEAR (A) 07/31/2017 1025   APPEARANCEUR Clear 04/03/2015 1216   LABSPEC 1.013 07/31/2017 1025   LABSPEC 1.004 04/03/2015 1216   PHURINE 5.0 07/31/2017 1025   GLUCOSEU NEGATIVE 07/31/2017 1025   GLUCOSEU Negative 04/03/2015 1216   HGBUR MODERATE (A) 07/31/2017 1025   BILIRUBINUR NEGATIVE 07/31/2017 1025   BILIRUBINUR Negative 04/03/2015 1216   KETONESUR 5 (A) 07/31/2017 1025   PROTEINUR 100 (A) 07/31/2017 1025   UROBILINOGEN 0.2 03/27/2011 0845   NITRITE NEGATIVE 07/31/2017 1025   LEUKOCYTESUR NEGATIVE 07/31/2017 1025   LEUKOCYTESUR  Negative 04/03/2015 1216     RADIOLOGY: Dg Chest 2 View  Result Date: 07/31/2017 CLINICAL DATA:  Chest pain abdominal pain EXAM: CHEST  2 VIEW COMPARISON:  05/27/2017 FINDINGS: COPD with hyperinflation of the lungs. Prominent lung markings likely due to mild scarring unchanged. Negative for heart failure. No change from the prior study. Atherosclerotic aortic arch. Streaky lung markings in the lingula have developed since the prior study, possible pneumonia or atelectasis IMPRESSION: COPD Lingular airspace disease may represent atelectasis or pneumonia. Electronically Signed   By: Franchot Gallo M.D.   On: 07/31/2017 08:34   Dg Abd 2 Views  Result Date: 07/31/2017 CLINICAL DATA:  Nausea, vomiting, bloating EXAM: ABDOMEN - 2 VIEW COMPARISON:  None. FINDINGS: There is no bowel dilatation to suggest obstruction. There is no evidence of pneumoperitoneum, portal venous gas or pneumatosis. There are no pathologic calcifications along the expected course of the ureters. Right total hip arthroplasty without failure complication. Prior left hip ORIF without hardware failure or complication. IMPRESSION: Negative. Electronically Signed   By: Kathreen Devoid  On: 07/31/2017 09:34    EKG: Orders placed or performed during the hospital encounter of 07/31/17  . ED EKG within 10 minutes  . ED EKG within 10 minutes  . Repeat EKG  . Repeat EKG    IMPRESSION AND PLAN: 82 year old male patient with history of prostate cancer,congestive heart failure, COPD, hypertension, hyperlipidemia presented to the emergency room with chest discomfort, shortness of breath and abdominal discomfort. Admitting diagnosis 1. Non-ST elevation MI 2. Congestive heart failure 3.emphysema 4. Hypertension 5. Hyperlipidemia Treatment plan Admit patient to medicalTelemetry floor Start patient on IV heparin drip Cycle troponin Cardiology consultation Check echocardiogram Start patient on aspirin and beta blocker and  statin Resume Lasix for diuresis   All the records are reviewed and case discussed with ED provider. Management plans discussed with the patient, family and they are in agreement.  CODE STATUS:FULL CODE Code Status History    Date Active Date Inactive Code Status Order ID Comments User Context   05/27/2017  7:23 PM 05/31/2017  2:12 PM Full Code [id number]  Baxter Hire, MD Inpatient   12/01/2016  8:42 PM 12/03/2016  4:05 PM Full Code [id number]  Vaughan Basta, MD Inpatient   09/12/2016  8:42 AM 09/13/2016  8:30 PM Full Code [id number]  Alonna Buckler, RN Inpatient   09/11/2016  8:46 AM 09/11/2016  4:59 PM Full Code [id number]  Dustin Flock, MD ED   08/31/2016  3:33 AM 09/06/2016  5:14 PM Full Code [id number]  Saundra Shelling, MD Inpatient       TOTAL TIME TAKING CARE OF THIS PATIENT: 52 minutes.    Saundra Shelling M.D on 07/31/2017 at 12:40 PM  Between 7am to 6pm - Pager - [phone]  After 6pm go to [email or web address] - password [email or web address] [facility]  [place] [facility]  Office  [phone]  CC: Primary care physician; Marguerita Merles, MD

## 2017-07-31 NOTE — ED Notes (Signed)
Patient transported to radiology

## 2017-07-31 NOTE — ED Notes (Signed)
Attempted to call report x 1  

## 2017-07-31 NOTE — ED Notes (Signed)
Emma RN aware of patient placement in Rm 4

## 2017-07-31 NOTE — ED Provider Notes (Signed)
[facility] Emergency Department Provider Note ____________________________________________   First MD Initiated Contact with Patient 07/31/17 [phone]     (approximate)  I have reviewed the triage vital signs and the nursing notes.   HISTORY  Chief Complaint  Abdominal Pain    HPI [name] is a 82 y.o. male presents with generalized weakness for approximately 6 days, beginning after he had an epidural last week for chronic groin pain. Patient reports feeling "out of it", sleeping a lot, and not wanting to get out of his bed. Patient reports associated nausea with several episodes of vomiting over the last several days, as well as urinary incontinence. The patient denied significant abdominal pain or distention to me, but reports that he feels generally somewhat tender. Patient states that he has been constipated since the procedure. He also reports difficulty breathing worse than his baseline, as well as some intermittent chest pain.  Past Medical History:  Diagnosis Date  . Alcoholism ([place])   . Asthma   . Bipolar affective disorder ([place])   . CHF (congestive heart failure) ([place])   . COPD (chronic obstructive pulmonary disease) (HCC)    COPD  . Coronary artery disease February 2005   PCI and Taxus drug-eluting stent placement to the distal RCA (3.5 x 12 mm)  . Degenerative joint disease   . Essential hypertension   . Hyperlipidemia   . Hypertension   . Prostate CA [facility])    prostate ca dx 50 ys ago;  . Prostate CA [facility])    prostate ca dx 20 yrs ago  . PVD (peripheral vascular disease) ([place])   . Tobacco abuse     Patient Active Problem List   Diagnosis Date Noted  . Hyperkalemia 05/28/2017  . Syncope 05/27/2017  . Meningioma ([place]) 01/05/2017  . GI bleed 12/01/2016  . Alcohol abuse 10/30/2016  . Chronic systolic heart failure ([place]) 10/04/2016  . COPD (chronic obstructive pulmonary disease) with chronic bronchitis ([place]) 10/04/2016  .  Obstructive sleep apnea 10/04/2016  . Hyponatremia 08/31/2016  . UTI (lower urinary tract infection) 08/31/2016  . Exertional dyspnea 02/11/2016  . Hyperlipidemia 12/03/2015  . Essential hypertension   . Coronary artery disease 01/19/2004    Past Surgical History:  Procedure Laterality Date  . CARDIAC CATHETERIZATION    . CORONARY ANGIOPLASTY WITH STENT PLACEMENT    . TOTAL HIP ARTHROPLASTY     right    Prior to Admission medications   Medication Sig Start Date End Date Taking? Authorizing Provider  albuterol (PROAIR HFA) 108 (90 Base) MCG/ACT inhaler Inhale 2 puffs into the lungs every 6 (six) hours as needed for wheezing or shortness of breath. Reported on 04/21/2016 05/31/17 10/05/18 Yes Loletha Grayer, MD  aspirin EC 81 MG tablet Take 81 mg by mouth every morning.   Yes [provider]  atorvastatin (LIPITOR) 40 MG tablet Take 40 mg by mouth daily.   Yes [provider]  fluticasone (FLONASE) 50 MCG/ACT nasal spray Place 1 spray into both nostrils daily. 05/31/17  Yes Wieting, Richard, MD  fluticasone-salmeterol (ADVAIR HFA) 191-47 MCG/ACT inhaler Inhale 2 puffs into the lungs 2 (two) times daily.   Yes [provider]  furosemide (LASIX) 40 MG tablet Take 0.5 tablets (20 mg total) by mouth 2 (two) times daily. 09/13/16  Yes Dustin Flock, MD  isosorbide mononitrate (IMDUR) 60 MG 24 hr tablet Take 1 tablet (60 mg total) by mouth daily. 05/31/17  Yes Loletha Grayer, MD  ketoconazole (NIZORAL) 2 %  shampoo Apply 1 application topically 3 (three) times a week.  11/21/16  Yes [provider]  levothyroxine (SYNTHROID, LEVOTHROID) 50 MCG tablet Take 50 mcg by mouth daily before breakfast.   Yes [provider]  losartan (COZAAR) 25 MG tablet Take 25 mg by mouth daily. 05/18/17  Yes [provider]  magnesium oxide (MAG-OX) 400 MG tablet Take 400 mg by mouth 2 (two) times daily.    Yes [provider]  metoprolol (LOPRESSOR) 50 MG  tablet Take 1 tablet (50 mg total) by mouth 2 (two) times daily. 09/06/16  Yes Epifanio Lesches, MD  nitroGLYCERIN (NITROSTAT) 0.4 MG SL tablet Take 0.4 mg by mouth every 5 (five) minutes x 3 doses as needed for chest pain. As needed for chest pain 12/06/15  Yes [provider]  polyethylene glycol (MIRALAX) packet Take 17 g by mouth daily. 05/31/17  Yes Wieting, Richard, MD  tiotropium (SPIRIVA HANDIHALER) 18 MCG inhalation capsule Place 18 mcg into inhaler and inhale daily.   Yes [provider]  vitamin B-6 (PYRIDOXINE) 25 MG tablet Take 25 mg by mouth daily.   Yes [provider]  gabapentin (NEURONTIN) 100 MG capsule Take 1 capsule (100 mg total) by mouth 3 (three) times daily. Patient not taking: Reported on 07/31/2017 05/31/17   Loletha Grayer, MD  hydrALAZINE (APRESOLINE) 100 MG tablet Take 1 tablet (100 mg total) by mouth 4 (four) times daily -  with meals and at bedtime. Patient not taking: Reported on 07/31/2017 05/31/17   Loletha Grayer, MD  ondansetron (ZOFRAN) 4 MG tablet Take 1 tablet (4 mg total) by mouth every 8 (eight) hours as needed for nausea or vomiting. Patient not taking: Reported on 07/31/2017 03/10/17   Rudene Re, MD  oxycodone (OXY-IR) 5 MG capsule Take 1 capsule (5 mg total) by mouth every 4 (four) hours as needed. Patient not taking: Reported on 07/31/2017 05/31/17   Loletha Grayer, MD  predniSONE (DELTASONE) 5 MG tablet 6 tabs po day1; 5 tabs po day2; 4 tabs po day3; 3 tabs po day4; 2 tabs po day5; 1 tab po day 6,7 Patient not taking: Reported on 07/31/2017 05/31/17   Loletha Grayer, MD    Allergies Amlodipine; Benazepril-hydrochlorothiazide; Chlorthalidone; and Other  Family History  Problem Relation Age of Onset  . Hypertension Mother   . Hyperlipidemia Mother   . Heart attack Mother   . Hypertension Father   . Hyperlipidemia Father   . Heart attack Father     Social History Social History  Substance Use Topics  .  Smoking status: Former Smoker    Packs/day: 1.00    Years: 50.00    Types: Cigarettes  . Smokeless tobacco: Never Used  . Alcohol use Yes     Comment: social     Review of Systems  Constitutional: Positive for chills Eyes: No visual changes. ENT: No sore throat. Cardiovascular: Positive for chest pain Respiratory: Positive for shortness of breath, negative for cough Gastrointestinal: Positive for nausea and vomiting Genitourinary: Positive for incontinence Musculoskeletal: Negative for back pain. Skin: Negative for rash. Neurological: Negative for headaches, focal weakness or numbness.   ____________________________________________   PHYSICAL EXAM:  VITAL SIGNS: ED Triage Vitals  Enc Vitals Group     BP 07/31/17 0806 (!) 154/70     Pulse Rate 07/31/17 0806 83     Resp 07/31/17 0806 13     Temp 07/31/17 0806 98.1 F (36.7 C)     Temp Source 07/31/17 0806 Oral  SpO2 07/31/17 0806 96 %     Weight 07/31/17 0807 255 lb (115.7 kg)     Height 07/31/17 0807 5\' 7"  (1.702 m)     Head Circumference --      Peak Flow --      Pain Score 07/31/17 0810 10     Pain Loc --      Pain Edu? --      Excl. in [place]? --     Constitutional: Alert and oriented. Comfortable appearing, in no acute distress. Eyes: Conjunctivae are normal.  Head: Atraumatic. Nose: No congestion/rhinnorhea. Mouth/Throat: Mucous membranes are moist.   Neck: Normal range of motion.  Cardiovascular: Normal rate, regular rhythm. Grossly normal heart sounds.  Good peripheral circulation. Respiratory: Slight increased respiratory effort, lungs with decreased breath sounds bilaterally but no rales or wheezes. Gastrointestinal: Moderate distention, nontender.  Genitourinary: No CVA tenderness. Musculoskeletal: No lower extremity edema.  Extremities warm and well perfused. Lower back with no erythema induration or warmth, and no wounds. Neurologic:  Normal speech and language. No gross focal neurologic deficits  are appreciated. Normal strength and intact sensation to lower extremities bilaterally Skin:  Skin is warm and dry. No rash noted. Psychiatric: Mood and affect are normal. Speech and behavior are normal.  ____________________________________________   LABS (all labs ordered are listed, but only abnormal results are displayed)  Labs Reviewed  COMPREHENSIVE METABOLIC PANEL - Abnormal; Notable for the following:       Result Value   Sodium 130 (*)    Chloride 95 (*)    CO2 21 (*)    Glucose, Bld 100 (*)    BUN 38 (*)    Creatinine, Ser 2.23 (*)    AST 147 (*)    ALT 82 (*)    Total Bilirubin 1.9 (*)    GFR calc non Af Amer 26 (*)    GFR calc Af Amer 30 (*)    All other components within normal limits  CBC - Abnormal; Notable for the following:    RBC 4.07 (*)    Hemoglobin 11.7 (*)    HCT 34.0 (*)    RDW 16.7 (*)    All other components within normal limits  URINALYSIS, COMPLETE (UACMP) WITH MICROSCOPIC - Abnormal; Notable for the following:    Color, Urine YELLOW (*)    APPearance CLEAR (*)    Hgb urine dipstick MODERATE (*)    Ketones, ur 5 (*)    Protein, ur 100 (*)    Bacteria, UA MANY (*)    Squamous Epithelial / LPF 0-5 (*)    All other components within normal limits  TROPONIN I - Abnormal; Notable for the following:    Troponin I 0.13 (*)    All other components within normal limits  LIPASE, BLOOD   ____________________________________________  EKG ED ECG REPORT I, Arta Silence, the attending physician, personally viewed and interpreted this ECG.  Date: 07/31/2017 EKG Time: 8:16 Rate: 80 Rhythm: normal sinus rhythm QRS Axis: normal Intervals: normal ST/T Wave abnormalities: Left ventricular hypertrophy Narrative Interpretation: No acute findings, no ischemic changes.  ED ECG REPORT I, Arta Silence, the attending physician, personally viewed and interpreted this ECG.  Date: 07/31/2017 EKG Time: 9:50 Rate: 72 Rhythm: normal sinus  rhythm QRS Axis: normal Intervals: normal ST/T Wave abnormalities: normal Narrative Interpretation: Unchanged from prior.  ____________________________________________  [id number]    ____________________________________________   PROCEDURES  Procedure(s) performed: No    Critical Care performed: No ____________________________________________   INITIAL IMPRESSION /  ASSESSMENT AND PLAN / ED COURSE  Pertinent labs & imaging results that were available during my care of the patient were reviewed by me and considered in my medical decision making (see chart for details).  82 year old male with extensive past medical history presents with vague generalized weakness for approximately 5 days, associated with nausea and vomiting, urinary incontinence, as well as shortness of breath, and some chest pain. Symptoms started after patient had an epidural for chronic pain. On exam vital signs are stable, patient is relatively comfortable appearing, and exam is as noted overall primary concern is for infection such as UTI versus GI source, less likely cardiac cause. Also consider dehydration, electrolyte abnormality, or other metabolic cause. Patient reports constipation however states his abdomen is not more distended than usual. Low suspicion for small bowel obstruction. No evidence of other acute intra-abdominal process baced on benign exam. Plan: Labs, UA, cardiac enzymes, chest and abdominal x-rays, and reassess.      ____________________________________________   FINAL CLINICAL IMPRESSION(S) / ED DIAGNOSES  Final diagnoses:  Abdominal pain  Non-ST elevation (NSTEMI) myocardial infarction (HCC)  Weakness      NEW MEDICATIONS STARTED DURING THIS VISIT:  New Prescriptions   No medications on file     Note:  This document was prepared using Dragon voice recognition software and may include unintentional dictation errors.    Arta Silence, MD 07/31/17 [phone]

## 2017-07-31 NOTE — ED Notes (Signed)
Patient made aware of need of urine sample. States he is unable to void at this time. 

## 2017-07-31 NOTE — ED Notes (Signed)
2A Called and notified patient and this RN are on their way up for bedside report.

## 2017-07-31 NOTE — Progress Notes (Signed)
*  PRELIMINARY RESULTS* Echocardiogram 2D Echocardiogram has been performed.  [name] 07/31/2017, 2:58 PM

## 2017-07-31 NOTE — ED Notes (Signed)
Patient in radiology at this time. Will assess once patient is in the room.

## 2017-07-31 NOTE — Progress Notes (Signed)
[name] for Heparin Indication: chest pain/ACS  Allergies  Allergen Reactions  . Amlodipine     Constipation   . Benazepril-Hydrochlorothiazide     Constipation   . Chlorthalidone     Hyponatremia  . Other Other (See Comments)    "ANY BLOOD PRESSURE MEDICATIONS THAT I'VE TRIED" - PT. DOES NOT REMEMBER WHICH ONES    Patient Measurements: Height: 5\' 7"  (170.2 cm) Weight: 255 lb (115.7 kg) IBW/kg (Calculated) : 66.1 Heparin Dosing Weight: 92.5 kg  Vital Signs: Temp: 98.1 F (36.7 C) (08/14 0806) Temp Source: Oral (08/14 0806) BP: 129/58 (08/14 1324) Pulse Rate: 71 (08/14 1231)  Labs:  Recent Labs  07/31/17 0849 07/31/17 1315  HGB 11.7*  --   HCT 34.0*  --   PLT 258  --   APTT  --  27  LABPROT  --  13.6  INR  --  1.04  CREATININE 2.23*  --   TROPONINI 0.13* 0.10*    Estimated Creatinine Clearance: 32.1 mL/min (A) (by C-G formula based on SCr of 2.23 mg/dL (H)).   Medical History: Past Medical History:  Diagnosis Date  . Alcoholism ([place])   . Asthma   . Bipolar affective disorder ([place])   . CHF (congestive heart failure) ([place])   . COPD (chronic obstructive pulmonary disease) (H[facility])    COPD  . Coronary artery disease February 2005   PCI and Taxus drug-eluting stent placement to the distal RCA (3.5 x 12 mm)  . Degenerative joint disease   . Essential hypertension   . Hyperlipidemia   . Hypertension   . Prostate CA [facility])    prostate ca dx 30 ys ago;  . Prostate CA [facility])    prostate ca dx 20 yrs ago  . PVD (peripheral vascular disease) ([place])   . Tobacco abuse    Assessment: 82 y/o M with a known h/o CAD not on anticoagulation PTA admitted with NSTEMI.   Goal of Therapy:  Heparin level 0.3-0.7 units/ml Monitor platelets by anticoagulation protocol: Yes   Plan:  Give 4000 units bolus x 1 Start heparin infusion at 1300 units/hr Check anti-Xa level in 8 hours and daily while on heparin Continue to monitor H&H  and platelets  Ulice Dash D 07/31/2017,2:16 PM

## 2017-08-01 ENCOUNTER — Inpatient Hospital Stay (HOSPITAL_BASED_OUTPATIENT_CLINIC_OR_DEPARTMENT_OTHER): Payer: Medicare Other

## 2017-08-01 DIAGNOSIS — I350 Nonrheumatic aortic (valve) stenosis: Secondary | ICD-10-CM | POA: Diagnosis not present

## 2017-08-01 DIAGNOSIS — R531 Weakness: Secondary | ICD-10-CM

## 2017-08-01 DIAGNOSIS — R109 Unspecified abdominal pain: Secondary | ICD-10-CM

## 2017-08-01 DIAGNOSIS — I25118 Atherosclerotic heart disease of native coronary artery with other forms of angina pectoris: Secondary | ICD-10-CM

## 2017-08-01 LAB — BASIC METABOLIC PANEL
ANION GAP: 11 (ref 5–15)
BUN: 40 mg/dL — ABNORMAL HIGH (ref 6–20)
CO2: 26 mmol/L (ref 22–32)
Calcium: 8.4 mg/dL — ABNORMAL LOW (ref 8.9–10.3)
Chloride: 95 mmol/L — ABNORMAL LOW (ref 101–111)
Creatinine, Ser: 2.35 mg/dL — ABNORMAL HIGH (ref 0.61–1.24)
GFR calc Af Amer: 28 mL/min — ABNORMAL LOW (ref >60)
GFR, EST NON AFRICAN AMERICAN: 25 mL/min — AB (ref >60)
GLUCOSE: 115 mg/dL — AB (ref 65–99)
POTASSIUM: 3.5 mmol/L (ref 3.5–5.1)
Sodium: 132 mmol/L — ABNORMAL LOW (ref 135–145)

## 2017-08-01 LAB — CBC
HEMATOCRIT: 32.8 % — AB (ref 40.0–52.0)
HEMOGLOBIN: 11.1 g/dL — AB (ref 13.0–18.0)
MCH: 28.9 pg (ref 26.0–34.0)
MCHC: 33.8 g/dL (ref 32.0–36.0)
MCV: 85.5 fL (ref 80.0–100.0)
Platelets: 186 10*3/uL (ref 150–440)
RBC: 3.83 MIL/uL — ABNORMAL LOW (ref 4.40–5.90)
RDW: 17.7 % — ABNORMAL HIGH (ref 11.5–14.5)
WBC: 6.4 10*3/uL (ref 3.8–10.6)

## 2017-08-01 LAB — HEPARIN LEVEL (UNFRACTIONATED): Heparin Unfractionated: 0.52 IU/mL (ref 0.30–0.70)

## 2017-08-01 LAB — TROPONIN I: TROPONIN I: 0.07 ng/mL — AB (ref <0.03)

## 2017-08-01 MED ORDER — REGADENOSON 0.4 MG/5ML IV SOLN
0.4000 mg | Freq: Once | INTRAVENOUS | Status: AC
  Administered 2017-08-01: 0.4 mg via INTRAVENOUS

## 2017-08-01 MED ORDER — POTASSIUM CHLORIDE CRYS ER 20 MEQ PO TBCR
20.0000 meq | EXTENDED_RELEASE_TABLET | Freq: Every day | ORAL | Status: DC
  Administered 2017-08-01 – 2017-08-02 (×2): 20 meq via ORAL
  Filled 2017-08-01 (×2): qty 1

## 2017-08-01 MED ORDER — HEPARIN SODIUM (PORCINE) 5000 UNIT/ML IJ SOLN
5000.0000 [IU] | Freq: Three times a day (TID) | INTRAMUSCULAR | Status: DC
  Administered 2017-08-01 – 2017-08-02 (×3): 5000 [IU] via SUBCUTANEOUS
  Filled 2017-08-01 (×3): qty 1

## 2017-08-01 MED ORDER — TECHNETIUM TC 99M TETROFOSMIN IV KIT
30.0000 | PACK | Freq: Once | INTRAVENOUS | Status: AC | PRN
  Administered 2017-08-01: 31.02 via INTRAVENOUS

## 2017-08-01 NOTE — Progress Notes (Signed)
[facility] NAME: [name]    MR#:  [id number]  DATE OF BIRTH:  [birth year]  SUBJECTIVE:   Patient here due to chest pain, shortness of breath. Troponins only mildly elevated. Asymptomatic this morning. Plan for 2 day stress test given his obesity.  REVIEW OF SYSTEMS:    Review of Systems  Constitutional: Negative for chills and fever.  HENT: Negative for congestion and tinnitus.   Eyes: Negative for blurred vision and double vision.  Respiratory: Negative for cough, shortness of breath and wheezing.   Cardiovascular: Negative for chest pain, orthopnea and PND.  Gastrointestinal: Negative for abdominal pain, diarrhea, nausea and vomiting.  Genitourinary: Negative for dysuria and hematuria.  Neurological: Negative for dizziness, sensory change and focal weakness.  All other systems reviewed and are negative.   Nutrition: Heart Healthy Tolerating Diet: Yes Tolerating PT: Await Eval.   DRUG ALLERGIES:   Allergies  Allergen Reactions  . Amlodipine     Constipation   . Benazepril-Hydrochlorothiazide     Constipation   . Chlorthalidone     Hyponatremia  . Other Other (See Comments)    "ANY BLOOD PRESSURE MEDICATIONS THAT I'VE TRIED" - PT. DOES NOT REMEMBER WHICH ONES    VITALS:  Blood pressure (!) 161/67, pulse 66, temperature 98 F (36.7 C), temperature source Oral, resp. rate 18, height 5\' 7"  (1.702 m), weight 115.7 kg (255 lb), SpO2 99 %.  PHYSICAL EXAMINATION:   Physical Exam  GENERAL:  82 y.o.-year-old obese patient lying in bed in no acute distress.  EYES: Pupils equal, round, reactive to light and accommodation. No scleral icterus. Extraocular muscles intact.  HEENT: Head atraumatic, normocephalic. Oropharynx and nasopharynx clear.  NECK:  Supple, no jugular venous distention. No thyroid enlargement, no tenderness.  LUNGS: Normal breath sounds bilaterally, no wheezing, rales, rhonchi. No use of accessory  muscles of respiration.  CARDIOVASCULAR: S1, S2 normal. No murmurs, rubs, or gallops.  ABDOMEN: Soft, nontender, nondistended. Bowel sounds present. No organomegaly or mass.  EXTREMITIES: No cyanosis, clubbing or edema b/l.    NEUROLOGIC: Cranial nerves II through XII are intact. No focal Motor or sensory deficits b/l.  Globally weak PSYCHIATRIC: The patient is alert and oriented x 3.  SKIN: No obvious rash, lesion, or ulcer.    LABORATORY PANEL:   CBC  Recent Labs Lab 08/01/17 0441  WBC 6.4  HGB 11.1*  HCT 32.8*  PLT 186   ------------------------------------------------------------------------------------------------------------------  Chemistries   Recent Labs Lab 07/31/17 0849 08/01/17 0441  NA 130* 132*  K 5.0 3.5  CL 95* 95*  CO2 21* 26  GLUCOSE 100* 115*  BUN 38* 40*  CREATININE 2.23* 2.35*  CALCIUM 9.0 8.4*  AST 147*  --   ALT 82*  --   ALKPHOS 120  --   BILITOT 1.9*  --    ------------------------------------------------------------------------------------------------------------------  Cardiac Enzymes  Recent Labs Lab 08/01/17 0105  TROPONINI 0.07*   ------------------------------------------------------------------------------------------------------------------  RADIOLOGY:  Dg Chest 2 View  Result Date: 07/31/2017 CLINICAL DATA:  Chest pain abdominal pain EXAM: CHEST  2 VIEW COMPARISON:  05/27/2017 FINDINGS: COPD with hyperinflation of the lungs. Prominent lung markings likely due to mild scarring unchanged. Negative for heart failure. No change from the prior study. Atherosclerotic aortic arch. Streaky lung markings in the lingula have developed since the prior study, possible pneumonia or atelectasis IMPRESSION: COPD Lingular airspace disease may represent atelectasis or pneumonia. Electronically Signed   By: Franchot Gallo M.D.  On: 07/31/2017 08:34   Dg Abd 2 Views  Result Date: 07/31/2017 CLINICAL DATA:  Nausea, vomiting, bloating EXAM:  ABDOMEN - 2 VIEW COMPARISON:  None. FINDINGS: There is no bowel dilatation to suggest obstruction. There is no evidence of pneumoperitoneum, portal venous gas or pneumatosis. There are no pathologic calcifications along the expected course of the ureters. Right total hip arthroplasty without failure complication. Prior left hip ORIF without hardware failure or complication. IMPRESSION: Negative. Electronically Signed   By: Kathreen Devoid   On: 07/31/2017 09:34     ASSESSMENT AND PLAN:   82 year old male with past medical history of obesity, prostate cancer, peripheral asked disease, hypertension, hyperlipidemia, history of coronary artery disease, COPD, chronic kidney disease stage III, chronic combined systolic diastolic CHF, bipolar disorder who presents to the hospital due to chest pain.  1. Chest pain-patient does have significant risk factors given his previous history of coronary disease and stent placement. -He was observed on telemetry, his troponins have remained flat. He was seen by cardiology and the plan for a stress test which is to be done over 2 days given his obesity. Patient to have his resting images done today. -Clinically asymptomatic now, continue aspirin, atorvastatin, Imdur, metoprolol, losartan.  2. Hypothyroidism-continue Synthroid.  3. COPD-no acute exacerbation. Continue Dulera, albuterol inhaler as needed.  4. Essential hypertension-continue metoprolol, Imdur  5. Neuropathy-continue gabapentin.  Possible discharge tomorrow after his stress test.  All the records are reviewed and case discussed with Care Management/Social Worker. Management plans discussed with the patient, family and they are in agreement.  CODE STATUS: Full  DVT Prophylaxis: Hep. SQ  TOTAL TIME TAKING CARE OF THIS PATIENT: 30 minutes.   POSSIBLE D/C IN 1-2 DAYS, DEPENDING ON CLINICAL CONDITION.   Henreitta Leber M.D on 08/01/2017 at 2:52 PM  Between 7am to 6pm - Pager -  [phone]  After 6pm go to [email or web address] - password [email or web address] [facility]  Office  [phone]  CC: Primary care physician; Marguerita Merles, MD

## 2017-08-01 NOTE — Progress Notes (Signed)
PT Cancellation Note  Patient Details Name: OLYVER HAWES MRN: [id number] DOB: 02/28/1935   Cancelled Treatment:    Reason Eval/Treat Not Completed: Medical issues which prohibited therapy. Consult received and chart reviewed. Pt currently pending stress test this date. Spoke to RN who agrees to hold at this time and re-attempt next date.   Steffi Noviello 08/01/2017, 10:06 AM  Greggory Stallion, PT, DPT [phone]

## 2017-08-01 NOTE — Progress Notes (Addendum)
Progress Note  Patient Name: [name] Date of Encounter: 08/01/2017  Primary Cardiologist: [facility]  Subjective   Feels somewhat better compared to yesterday, still very weak Felt like he was not going to make it, that is why he came to the hospital He has chronic GI/diarrhea issues No chest pain overnight Reports having severe fatigue, general malaise  Inpatient Medications    Scheduled Meds: . aspirin EC  81 mg Oral Daily  . atorvastatin  40 mg Oral Daily  . fluticasone  1 spray Each Nare Daily  . furosemide  20 mg Oral BID  . gabapentin  100 mg Oral TID  . heparin subcutaneous  5,000 Units Subcutaneous Q8H  . hydrALAZINE  100 mg Oral TID WC & HS  . isosorbide mononitrate  60 mg Oral Daily  . levothyroxine  50 mcg Oral QAC breakfast  . losartan  25 mg Oral Daily  . magnesium oxide  400 mg Oral BID  . metoprolol tartrate  50 mg Oral BID  . mometasone-formoterol  2 puff Inhalation BID  . potassium chloride  20 mEq Oral Daily  . sodium chloride flush  3 mL Intravenous Q12H  . tiotropium  18 mcg Inhalation Daily   Continuous Infusions: . sodium chloride     PRN Meds: sodium chloride, acetaminophen, albuterol, nitroGLYCERIN, ondansetron (ZOFRAN) IV, sodium chloride flush   Vital Signs    Vitals:   08/01/17 0816 08/01/17 1240 08/01/17 1625 08/01/17 1824  BP: (!) 164/63 (!) 161/67 (!) 128/54 (!) 101/42  Pulse: 63 66 72 74  Resp: 18 18 (!) 21   Temp: 97.8 F (36.6 C) 98 F (36.7 C) 98.2 F (36.8 C)   TempSrc: Oral Oral Oral   SpO2: 98% 99% 97%   Weight:      Height:        Intake/Output Summary (Last 24 hours) at 08/01/17 1832 Last data filed at 08/01/17 0947  Gross per 24 hour  Intake           432.18 ml  Output                0 ml  Net           432.18 ml   Filed Weights   07/31/17 0807  Weight: 255 lb (115.7 kg)    Telemetry    Normal sinus rhythm - Personally Reviewed  ECG      Physical Exam   GEN: No acute distress.  Morbidly  obese Neck: No JVD Cardiac: RRR, no murmurs, rubs, or gallops.  Respiratory: Clear to auscultation bilaterally. GI: Soft, nontender, non-distended  MS: No edema; No deformity. Neuro:  Nonfocal  Psych: Normal affect   Labs    Chemistry Recent Labs Lab 07/31/17 0849 08/01/17 0441  NA 130* 132*  K 5.0 3.5  CL 95* 95*  CO2 21* 26  GLUCOSE 100* 115*  BUN 38* 40*  CREATININE 2.23* 2.35*  CALCIUM 9.0 8.4*  PROT 6.7  --   ALBUMIN 3.7  --   AST 147*  --   ALT 82*  --   ALKPHOS 120  --   BILITOT 1.9*  --   GFRNONAA 26* 25*  GFRAA 30* 28*  ANIONGAP 14 11     Hematology Recent Labs Lab 07/31/17 0849 08/01/17 0441  WBC 8.6 6.4  RBC 4.07* 3.83*  HGB 11.7* 11.1*  HCT 34.0* 32.8*  MCV 83.7 85.5  MCH 28.8 28.9  MCHC 34.4 33.8  RDW 16.7* 17.7*  PLT 258 186    Cardiac Enzymes Recent Labs Lab 07/31/17 0849 07/31/17 1315 07/31/17 2054 08/01/17 0105  TROPONINI 0.13* 0.10* 0.09* 0.07*   No results for input(s): TROPIPOC in the last 168 hours.   BNP Recent Labs Lab 07/31/17 1731  BNP 457.0*     DDimer No results for input(s): DDIMER in the last 168 hours.   Radiology    Dg Chest 2 View  Result Date: 07/31/2017 CLINICAL DATA:  Chest pain abdominal pain EXAM: CHEST  2 VIEW COMPARISON:  05/27/2017 FINDINGS: COPD with hyperinflation of the lungs. Prominent lung markings likely due to mild scarring unchanged. Negative for heart failure. No change from the prior study. Atherosclerotic aortic arch. Streaky lung markings in the lingula have developed since the prior study, possible pneumonia or atelectasis IMPRESSION: COPD Lingular airspace disease may represent atelectasis or pneumonia. Electronically Signed   By: Franchot Gallo M.D.   On: 07/31/2017 08:34   Dg Abd 2 Views  Result Date: 07/31/2017 CLINICAL DATA:  Nausea, vomiting, bloating EXAM: ABDOMEN - 2 VIEW COMPARISON:  None. FINDINGS: There is no bowel dilatation to suggest obstruction. There is no evidence of  pneumoperitoneum, portal venous gas or pneumatosis. There are no pathologic calcifications along the expected course of the ureters. Right total hip arthroplasty without failure complication. Prior left hip ORIF without hardware failure or complication. IMPRESSION: Negative. Electronically Signed   By: Kathreen Devoid   On: 07/31/2017 09:34    Cardiac Studies     Patient Profile     82 y.o. male known coronary disease lower back pain and on 8/8, he underwent lumbar injection/epidural in the setting of spinal stenosis and lumbar radiculitis.  Following injection, he later developed profound fatigue and within 2-3 days, constant c/p, dyspnea, palpitations, and intermittent lightheadedness.  Assessment & Plan    1.  Midsternal chest pain/Troponin elevation/CAD:  h/o CAD s/p PCI/DES to the RCA in 2005.   Troponin was elevated @ 0.13 now trending down  Likely demand ischemia in the setting of severe hypertension He is on heparin infusion. Will turn the infusion off as enzymes are trending down Long discussion with him concerning what to do concerning ischemia workup Not a good candidate for cardiac catheterization given chronic kidney disease, creatinine greater than 2 He has eaten breakfast this morning Recommended 2 day stress test/pharmacologic study given his size He is unable to treadmill  2.  Essential HTN:   Blood pressure elevated 160 He is on hydralazine, isosorbide, losartan, metoprolol  3.  HL:  cont statin rx.  LDL 97 in 04/2016.  4.  Morbid Obesity:  nutritional counseling.  5.   chronic renal failure Likely secondary to long-standing hypertension  Long discussion with patient, medicine service, discussed with nuclear stress lab, orders placed Discussed with nursing  Total encounter time more than 35 minutes  Greater than 50% was spent in counseling and coordination of care with the patient   Signed, Ida Rogue, MD  08/01/2017, 6:32 PM

## 2017-08-01 NOTE — Progress Notes (Signed)
[place] for Heparin Indication: chest pain/ACS  Allergies  Allergen Reactions  . Amlodipine     Constipation   . Benazepril-Hydrochlorothiazide     Constipation   . Chlorthalidone     Hyponatremia  . Other Other (See Comments)    "ANY BLOOD PRESSURE MEDICATIONS THAT I'VE TRIED" - PT. DOES NOT REMEMBER WHICH ONES    Patient Measurements: Height: 5\' 7"  (170.2 cm) Weight: 255 lb (115.7 kg) IBW/kg (Calculated) : 66.1 Heparin Dosing Weight: 92.5 kg  Vital Signs: Temp: 97.9 F (36.6 C) (08/15 0436) Temp Source: Oral (08/15 0436) BP: 165/70 (08/15 0436) Pulse Rate: 61 (08/15 0436)  Labs:  Recent Labs  07/31/17 0849 07/31/17 1315 07/31/17 2054 08/01/17 0105 08/01/17 0441  HGB 11.7*  --   --   --  11.1*  HCT 34.0*  --   --   --  32.8*  PLT 258  --   --   --  186  APTT  --  27  --   --   --   LABPROT  --  13.6  --   --   --   INR  --  1.04  --   --   --   HEPARINUNFRC  --   --  0.30  --  0.52  CREATININE 2.23*  --   --   --  2.35*  TROPONINI 0.13* 0.10* 0.09* 0.07*  --     Estimated Creatinine Clearance: 30.5 mL/min (A) (by C-G formula based on SCr of 2.35 mg/dL (H)).   Medical History: Past Medical History:  Diagnosis Date  . Alcoholism ([place])   . Asthma   . Bipolar affective disorder ([place])   . Chronic combined systolic and diastolic CHF (congestive heart failure) ([place])    a. 08/2016 Echo: EF 40-45%, mild AS, mild to mod MR, mildly dil LA/RA, mild-mod TR.  . CKD (chronic kidney disease), stage III   . COPD (chronic obstructive pulmonary disease) ([facility])    COPD  . Coronary artery disease    a. 01/2004 s/p PCI and Taxus drug-eluting stent placement to the distal RCA (3.5 x 12 mm).  . Degenerative joint disease   . Essential hypertension   . Hyperlipidemia   . Hypertension   . Prostate CA [facility])    prostate ca dx 20 yrs ago  . PVD (peripheral vascular disease) ([place])   . Tobacco abuse    Assessment: 82 y/o M with a  known h/o CAD not on anticoagulation PTA admitted with NSTEMI.   Goal of Therapy:  Heparin level 0.3-0.7 units/ml Monitor platelets by anticoagulation protocol: Yes   Plan:  Give 4000 units bolus x 1 Start heparin infusion at 1300 units/hr Check anti-Xa level in 8 hours and daily while on heparin Continue to monitor H&H and platelets   8/15 @ 0500 HL 0.52 therapeutic x 2. Will continue current rate and will recheck HL w/ am labs.  Tobie Lords, PharmD, BCPS Clinical Pharmacist 08/01/2017

## 2017-08-01 NOTE — Progress Notes (Signed)
Patient taken off the floor for stress test

## 2017-08-01 NOTE — Progress Notes (Signed)
Patient complaining of chest pain at beginning of shift. Relieved by 2 SL nitro. Patient complaining of nausea as well. Relieved by zofran. Will monitor.

## 2017-08-02 ENCOUNTER — Encounter: Payer: Medicare Other | Attending: Cardiovascular Disease

## 2017-08-02 DIAGNOSIS — R079 Chest pain, unspecified: Secondary | ICD-10-CM | POA: Insufficient documentation

## 2017-08-02 DIAGNOSIS — G4733 Obstructive sleep apnea (adult) (pediatric): Secondary | ICD-10-CM

## 2017-08-02 LAB — HEPARIN LEVEL (UNFRACTIONATED): Heparin Unfractionated: <0.1 IU/mL — ABNORMAL LOW (ref 0.30–0.70)

## 2017-08-02 LAB — NM MYOCAR MULTI W/SPECT W/WALL MOTION / EF
CHL CUP NUCLEAR SDS: 11
CHL CUP NUCLEAR SSS: 11
CHL CUP RESTING HR STRESS: 64 {beats}/min
CSEPHR: 62 %
CSEPPHR: 88 {beats}/min
LVDIAVOL: 122 mL (ref 62–150)
LVSYSVOL: 63 mL
NUC STRESS TID: 1.25
SRS: 0

## 2017-08-02 LAB — GLUCOSE, CAPILLARY: Glucose-Capillary: 99 mg/dL (ref 65–99)

## 2017-08-02 MED ORDER — TECHNETIUM TC 99M TETROFOSMIN IV KIT
33.1700 | PACK | Freq: Once | INTRAVENOUS | Status: AC | PRN
  Administered 2017-08-02: 33.17 via INTRAVENOUS

## 2017-08-02 MED ORDER — HYDRALAZINE HCL 50 MG PO TABS: 50.0000 mg | ORAL_TABLET | Freq: Three times a day (TID) | ORAL | Status: DC

## 2017-08-02 MED ORDER — SODIUM CHLORIDE 0.9 % IV BOLUS (SEPSIS)
500.0000 mL | Freq: Once | INTRAVENOUS | Status: AC
  Administered 2017-08-02: 500 mL via INTRAVENOUS

## 2017-08-02 MED ORDER — ISOSORBIDE MONONITRATE ER 30 MG PO TB24: 30.0000 mg | ORAL_TABLET | Freq: Every day | ORAL | Status: DC

## 2017-08-02 NOTE — Progress Notes (Signed)
Patient c/o nausea and dizziness. MD notified of VS and orhtostatic pressures. No new orders at this time.Patient states " I don't take blood pressure medication everyday" Explained to patient the importance of taking medication.

## 2017-08-02 NOTE — Progress Notes (Addendum)
Progress Note  Patient Name: [name] Date of Encounter: 08/02/2017  Primary Cardiologist: [facility]  Subjective   Feels better today, improving each day No chest pain overnight Stress portion of stress test performed yesterday afternoon Overall no significant ischemia on stress images, apical thinning noted   He reports that he does not use his CPAP. Got out of the habit, has a new machine He would like someone to come to his house to set it up  Inpatient Medications    Scheduled Meds: . aspirin EC  81 mg Oral Daily  . atorvastatin  40 mg Oral Daily  . fluticasone  1 spray Each Nare Daily  . furosemide  20 mg Oral BID  . gabapentin  100 mg Oral TID  . heparin subcutaneous  5,000 Units Subcutaneous Q8H  . hydrALAZINE  100 mg Oral TID WC & HS  . isosorbide mononitrate  60 mg Oral Daily  . levothyroxine  50 mcg Oral QAC breakfast  . losartan  25 mg Oral Daily  . magnesium oxide  400 mg Oral BID  . metoprolol tartrate  50 mg Oral BID  . mometasone-formoterol  2 puff Inhalation BID  . potassium chloride  20 mEq Oral Daily  . sodium chloride flush  3 mL Intravenous Q12H  . tiotropium  18 mcg Inhalation Daily   Continuous Infusions: . sodium chloride     PRN Meds: sodium chloride, acetaminophen, albuterol, nitroGLYCERIN, ondansetron (ZOFRAN) IV, sodium chloride flush   Vital Signs    Vitals:   08/01/17 1946 08/02/17 0437 08/02/17 0828 08/02/17 1150  BP: (!) 124/47 (!) 113/53 (!) 127/57 (!) 82/49  Pulse: 71 63 71   Resp: 18 18    Temp: 98.6 F (37 C) 98 F (36.7 C) 97.7 F (36.5 C)   TempSrc: Oral Oral Oral   SpO2: 97% 94% 96% 97%  Weight:      Height:        Intake/Output Summary (Last 24 hours) at 08/02/17 1205 Last data filed at 08/02/17 0900  Gross per 24 hour  Intake              240 ml  Output             1001 ml  Net             -761 ml   Filed Weights   07/31/17 0807  Weight: 255 lb (115.7 kg)    Telemetry    Normal sinus rhythm -  Personally Reviewed  ECG      Physical Exam   GEN: No acute distress.  Morbidly obese Neck: No JVD Cardiac: RRR, no murmurs, rubs, or gallops.  Respiratory: Clear to auscultation bilaterally. GI: Soft, nontender, non-distended  MS: No edema; No deformity. Neuro:  Nonfocal  Psych: Normal affect   Labs    Chemistry  Recent Labs Lab 07/31/17 0849 08/01/17 0441  NA 130* 132*  K 5.0 3.5  CL 95* 95*  CO2 21* 26  GLUCOSE 100* 115*  BUN 38* 40*  CREATININE 2.23* 2.35*  CALCIUM 9.0 8.4*  PROT 6.7  --   ALBUMIN 3.7  --   AST 147*  --   ALT 82*  --   ALKPHOS 120  --   BILITOT 1.9*  --   GFRNONAA 26* 25*  GFRAA 30* 28*  ANIONGAP 14 11     Hematology  Recent Labs Lab 07/31/17 0849 08/01/17 0441  WBC 8.6 6.4  RBC 4.07* 3.83*  HGB 11.7* 11.1*  HCT 34.0* 32.8*  MCV 83.7 85.5  MCH 28.8 28.9  MCHC 34.4 33.8  RDW 16.7* 17.7*  PLT 258 186    Cardiac Enzymes  Recent Labs Lab 07/31/17 0849 07/31/17 1315 07/31/17 2054 08/01/17 0105  TROPONINI 0.13* 0.10* 0.09* 0.07*   No results for input(s): TROPIPOC in the last 168 hours.   BNP  Recent Labs Lab 07/31/17 1731  BNP 457.0*     DDimer No results for input(s): DDIMER in the last 168 hours.   Radiology    No results found.  Cardiac Studies     Patient Profile     82 y.o. male known coronary disease lower back pain and on 8/8, he underwent lumbar injection/epidural in the setting of spinal stenosis and lumbar radiculitis.  Following injection, he later developed profound fatigue and within 2-3 days, constant c/p, dyspnea, palpitations, and intermittent lightheadedness.  Assessment & Plan    1.  Midsternal chest pain/Troponin elevation/CAD: This is not a non-STEMI  h/o CAD s/p PCI/DES to the RCA in 2005.   Likely demand ischemia in the setting of severe hypertension  2 day stress test/pharmacologic study given his size Stress images yesterday showing no significant ischemia, resting images  today Should be able to discharge home from cardiac perspective later this afternoon after final images  2.  Essential HTN:   Concern of medication noncompliance given he was hypertensive on arrival,  now hypotensive on his medications He is on  isosorbide, losartan, metoprolol Notes indicating he was not taking hydralazine at home On the above medications now running low, orthostatic will hold the hydralazine. Systolic pressure in the 21Y  3.  HL:  cont statin rx.  LDL 97 in 04/2016.  4.  Morbid Obesity:  nutritional counseling.  5.   chronic renal failure Likely secondary to long-standing hypertension  6) OSA Noncompliant with his CPAP Needs follow-up with pulmonary for him to feel comfortable putting his mask back on Stressed him the importance of wearing his CPAP    Total encounter time more than 25 minutes  Greater than 50% was spent in counseling and coordination of care with the patient   Signed, Ida Rogue, MD  08/02/2017, 12:05 PM

## 2017-08-02 NOTE — Evaluation (Signed)
Physical Therapy Evaluation Patient Details Name: [name] MRN: [id number] DOB: October 03, 1935 Today's Date: 08/02/2017   History of Present Illness  Pt is a 82 yo M admitted to acute care with dx of Non-STEMI. Prior to admission, pt indep with amb short distances in home, using no AD. PMH: alcoholism, asthma, bipolar affective disorder, CHF, COPD, CAD, deginerative joint disease, HTN, and PVD.  Clinical Impression  Pt in resting stage of cardio stress testing today; SPT consulted with Dr. Rockey Situ, who approved eval/treating pt. Pt is pleasant and willing to participate for return to PLOF. Pt performs bed mobility with supervision, tranfers with supervision, and ambulation with CGA, due to impaired strength, balance, and endurance. Pt amb total of 80 ft, with rest break after 40 ft, and reported RPE score of 8/10. Pt is primarily limited by cardiovascular endurance, experiencing reports of dizziness and SOB following amb; BP at 76/40 mmHg. Pt reclined into supine position and nursing notified of pt response. Overall, pt responded well to today's treatment with no adverse affects. Pt would benefit from skilled PT to address the previously mentioned impairments and promote return to PLOF. Currently recommending HHPT with supervision for mobility/OOB, pending d/c.      Follow Up Recommendations (P) Home health PT;Supervision for mobility/OOB    Equipment Recommendations  None recommended by PT    Recommendations for Other Services       Precautions / Restrictions Precautions Precautions: Fall Restrictions Weight Bearing Restrictions: No      Mobility  Bed Mobility Overal bed mobility: Needs Assistance Bed Mobility: Supine to Sit     Supine to sit: Supervision     General bed mobility comments: SUpervision with supine to sit. Requires increased time with HOB elevated and uses hand rails. Pt with mild/brief reports of dizziness upon sitting  Transfers Overall transfer level: Needs  assistance   Transfers: Sit to/from Stand Sit to Stand: Supervision         General transfer comment: Requires min cues for mechanics and safety. Requires increased time to perform task.   Ambulation/Gait Ambulation/Gait assistance: Min guard Ambulation Distance (Feet): 80 Feet Assistive device: Rolling walker (2 wheeled) Gait Pattern/deviations: Step-through pattern     General Gait Details: Pt with good reciprocal gait pattern, amb total of 80 ft with seated rest break after 40 ft. Pt with RPE of 8/10, but no reports of dizziness with amb. Mild SOB noted. Min cues for mechanics and safety. Upon sitting after amb, pt with reports of dizziness 8-10/10 coming and going. Pt with increased SOB. SPT assessed vitals: O2 sat 94%; HR 75 bpm; and BP 76/40 mmHg. SPT reclined pt and nursing was notified of pt response.   Stairs            Wheelchair Mobility    Modified Rankin (Stroke Patients Only)       Balance Overall balance assessment: Needs assistance Sitting-balance support: Bilateral upper extremity supported;Feet supported Sitting balance-Leahy Scale: Good Sitting balance - Comments: requires no cues for mechanics or safety   Standing balance support: Bilateral upper extremity supported Standing balance-Leahy Scale: Fair Standing balance comment: Requires UE support and presents with initial increase in postural sway; able to self correct.                              Pertinent Vitals/Pain Pain Assessment: No/denies pain    Home Living Family/patient expects to be discharged to:: Private residence Living Arrangements: Alone  Available Help at Discharge: Family Type of Home: Apartment Home Access: Elevator     Home Layout: One level Home Equipment: Environmental consultant - 2 wheels;Kasandra Knudsen - single point Additional Comments: Pt states his apartment is equipped for seniors and is handicapped accessible.    Prior Function Level of Independence: Independent          Comments: Pt i ndependent with all ADL's and amb within the home. Pt only ambulates short distances adn requires assits from daughter for IADL's. Pt does not drive.      Hand Dominance        Extremity/Trunk Assessment   Upper Extremity Assessment Upper Extremity Assessment: Overall WFL for tasks assessed    Lower Extremity Assessment Lower Extremity Assessment: Generalized weakness (MMT to B LE's grossly 4/5. )       Communication   Communication: No difficulties  Cognition Arousal/Alertness: Awake/alert Behavior During Therapy: WFL for tasks assessed/performed Overall Cognitive Status: Within Functional Limits for tasks assessed                                        General Comments      Exercises Other Exercises Other Exercises: Supine therex perofrmed to B LE's x10 reps with supervision: ankle pumps, SLR, hip abd, and glute sets. Pt with good mechancis, requiring no cues.    Assessment/Plan    PT Assessment Patient needs continued PT services  PT Problem List Decreased strength;Decreased activity tolerance;Decreased balance;Decreased mobility;Decreased coordination;Cardiopulmonary status limiting activity;Obesity       PT Treatment Interventions DME instruction;Gait training;Functional mobility training;Therapeutic activities;Therapeutic exercise;Balance training;Neuromuscular re-education;Patient/family education;Manual techniques    PT Goals (Current goals can be found in the Care Plan section)  Acute Rehab PT Goals Patient Stated Goal: to go home  PT Goal Formulation: With patient Time For Goal Achievement: 08/16/17 Potential to Achieve Goals: Good    Frequency Min 2X/week   Barriers to discharge        Co-evaluation               AM-PAC PT "6 Clicks" Daily Activity  Outcome Measure Difficulty turning over in bed (including adjusting bedclothes, sheets and blankets)?: A Little Difficulty moving from lying on back to sitting  on the side of the bed? : A Little Difficulty sitting down on and standing up from a chair with arms (e.g., wheelchair, bedside commode, etc,.)?: Total Help needed moving to and from a bed to chair (including a wheelchair)?: A Little Help needed walking in hospital room?: A Little Help needed climbing 3-5 steps with a railing? : A Lot 6 Click Score: 15    End of Session Equipment Utilized During Treatment: Gait belt Activity Tolerance: Patient tolerated treatment well Patient left: in chair;with call bell/phone within reach;with chair alarm set Nurse Communication: Mobility status;Other (comment) (BP response following amb and pt c/o dizzy/SOB) PT Visit Diagnosis: Unsteadiness on feet (R26.81);Other abnormalities of gait and mobility (R26.89);Muscle weakness (generalized) (M62.81)    Time: 9381-0175 PT Time Calculation (min) (ACUTE ONLY): 28 min   Charges:         PT G Codes:       Oran Rein PT, SPT  Bevelyn Ngo 08/02/2017, 1:14 PM

## 2017-08-02 NOTE — Plan of Care (Signed)
Problem: Education: Goal: Knowledge of [facility] Education information/materials will improve Outcome: Progressing Patient to have nuclear stress test to be completed.   Problem: Safety: Goal: Ability to remain free from injury will improve Outcome: Progressing Patient will be free from injury.

## 2017-08-02 NOTE — Care Management (Signed)
Patient is agreeable to home health physical therapy as recommended.  He has access to two walkers at home.  He is also agreeable to have home health nurse to monitor blood pressures and CP assessment.  Patient has a cpap machine in the home and does not wear. Says he recently received a new one but does not know the name of the company.  Would like for home health to show him how to use it

## 2017-08-02 NOTE — Progress Notes (Addendum)
Reassessed patient. Pt c/o dizziness. BP 82/41 will notify MD.

## 2017-08-02 NOTE — Progress Notes (Signed)
   08/02/17 1553 08/02/17 1554 08/02/17 1557  Vitals  BP (!) 123/52 (!) 108/55 (!) 114/57  MAP (mmHg) 68 65 71  BP Method Automatic Automatic Automatic  Patient Position (if appropriate) Sitting Standing Standing  Pulse Rate 75 89 92

## 2017-08-02 NOTE — Progress Notes (Signed)
This Probation officer was informed by PT  that patient become hypotensive after therapy. BP 76/41. Patient c/o dizziness and SOB. Will notify MD.  1053 BP retaken  87/47 sitting in chair  HR 763  Pt c/o SOB and dizziness.  1058-patient assisted back to bed BP 101/52 HR 73 O2 sats 96%.

## 2017-08-02 NOTE — Progress Notes (Signed)
Discharge instructions reviewed with patient and family. Answered patient and families questions and concerns. Patient/Family educated on the importance of checking blood pressure everyday. IV removed, dressing applied. NAD noted.

## 2017-08-03 NOTE — Discharge Summary (Signed)
[facility] NAME: [name]    MR#:  [id number]  DATE OF BIRTH:  [birth year]  DATE OF ADMISSION:  07/31/2017 ADMITTING PHYSICIAN: Saundra Shelling, MD  DATE OF DISCHARGE: 08/02/2017  7:10 PM  PRIMARY CARE PHYSICIAN: Marguerita Merles, MD    ADMISSION DIAGNOSIS:  Weakness [R53.1] Non-ST elevation (NSTEMI) myocardial infarction [facility]) [I21.4] Abdominal pain [R10.9]  DISCHARGE DIAGNOSIS:  Active Problems:   Elevated troponin I level   SECONDARY DIAGNOSIS:   Past Medical History:  Diagnosis Date  . Alcoholism ([place])   . Asthma   . Bipolar affective disorder ([place])   . Chronic combined systolic and diastolic CHF (congestive heart failure) ([place])    a. 08/2016 Echo: EF 40-45%, mild AS, mild to mod MR, mildly dil LA/RA, mild-mod TR.  . CKD (chronic kidney disease), stage III   . COPD (chronic obstructive pulmonary disease) (HCC)    COPD  . Coronary artery disease    a. 01/2004 s/p PCI and Taxus drug-eluting stent placement to the distal RCA (3.5 x 12 mm).  . Degenerative joint disease   . Essential hypertension   . Hyperlipidemia   . Hypertension   . Prostate CA [facility])    prostate ca dx 20 yrs ago  . PVD (peripheral vascular disease) ([place])   . Tobacco abuse     HOSPITAL COURSE:   82 year old male with past medical history of obesity, prostate cancer, peripheral asked disease, hypertension, hyperlipidemia, history of coronary artery disease, COPD, chronic kidney disease stage III, chronic combined systolic diastolic CHF, bipolar disorder who presents to the hospital due to chest pain.  1. Chest pain-patient does have significant risk factors given his previous history of coronary disease and stent placement. -patient was observed on telemetry and his troponins did not trending upwards. He was seen by cardiology and underwent a nuclear medicine stress test over 2 days given his obesity which showed no evidence of acute myocardial  ischemia. - Patient is presently asymptomatic and therefore being discharged on aspirin, statin,Imdur, metoprolol, losartan.  2. hypotension-patient developed some relative hypotension while in the hospital. He was although not orthostatic. His Imdur dose was cut in half and he was taken off the hydralazine. He will continue his other antihypertensives as stated below. Further titration to his blood pressure meds can be done as an outpatient.  3. Hypothyroidism- he will continue Synthroid.  4. COPD-no acute exacerbation while in the hospital.  - pt. Will cont. His Advair, Albuterol inhaler and Spiriva upon discharge.  5. Essential hypertension- he will continue metoprolol, Imdur, Losartan.  - he was taken off the hydralazine due to relative Hypotension.    DISCHARGE CONDITIONS:   Stable  CONSULTS OBTAINED:  Treatment Team:  Nelva Bush, MD  DRUG ALLERGIES:   Allergies  Allergen Reactions  . Amlodipine     Constipation   . Benazepril-Hydrochlorothiazide     Constipation   . Chlorthalidone     Hyponatremia  . Other Other (See Comments)    "ANY BLOOD PRESSURE MEDICATIONS THAT I'VE TRIED" - PT. DOES NOT REMEMBER WHICH ONES    DISCHARGE MEDICATIONS:   Allergies as of 08/02/2017      Reactions   Amlodipine    Constipation   Benazepril-hydrochlorothiazide    Constipation   Chlorthalidone    Hyponatremia   Other Other (See Comments)   "ANY BLOOD PRESSURE MEDICATIONS THAT I'VE TRIED" - PT. DOES NOT REMEMBER WHICH ONES      Medication  List    STOP taking these medications   gabapentin 100 MG capsule Commonly known as:  NEURONTIN   hydrALAZINE 100 MG tablet Commonly known as:  APRESOLINE   ondansetron 4 MG tablet Commonly known as:  ZOFRAN   oxycodone 5 MG capsule Commonly known as:  OXY-IR   predniSONE 5 MG tablet Commonly known as:  DELTASONE     TAKE these medications   albuterol 108 (90 Base) MCG/ACT inhaler Commonly known as:  PROAIR  HFA Inhale 2 puffs into the lungs every 6 (six) hours as needed for wheezing or shortness of breath. Reported on 04/21/2016   aspirin EC 81 MG tablet Take 81 mg by mouth every morning.   atorvastatin 40 MG tablet Commonly known as:  LIPITOR Take 40 mg by mouth daily.   fluticasone 50 MCG/ACT nasal spray Commonly known as:  FLONASE Place 1 spray into both nostrils daily.   fluticasone-salmeterol 115-21 MCG/ACT inhaler Commonly known as:  ADVAIR HFA Inhale 2 puffs into the lungs 2 (two) times daily.   furosemide 40 MG tablet Commonly known as:  LASIX Take 0.5 tablets (20 mg total) by mouth 2 (two) times daily.   isosorbide mononitrate 30 MG 24 hr tablet Commonly known as:  IMDUR Take 1 tablet (30 mg total) by mouth daily. What changed:  medication strength  how much to take   ketoconazole 2 % shampoo Commonly known as:  NIZORAL Apply 1 application topically 3 (three) times a week.   levothyroxine 50 MCG tablet Commonly known as:  SYNTHROID, LEVOTHROID Take 50 mcg by mouth daily before breakfast.   losartan 25 MG tablet Commonly known as:  COZAAR Take 25 mg by mouth daily.   magnesium oxide 400 MG tablet Commonly known as:  MAG-OX Take 400 mg by mouth 2 (two) times daily.   metoprolol tartrate 50 MG tablet Commonly known as:  LOPRESSOR Take 1 tablet (50 mg total) by mouth 2 (two) times daily.   NITROSTAT 0.4 MG SL tablet Generic drug:  nitroGLYCERIN Take 0.4 mg by mouth every 5 (five) minutes x 3 doses as needed for chest pain. As needed for chest pain   polyethylene glycol packet Commonly known as:  MIRALAX Take 17 g by mouth daily.   SPIRIVA HANDIHALER 18 MCG inhalation capsule Generic drug:  tiotropium Place 18 mcg into inhaler and inhale daily.   vitamin B-6 25 MG tablet Commonly known as:  pyridOXINE Take 25 mg by mouth daily.         DISCHARGE INSTRUCTIONS:   DIET:  Cardiac diet  DISCHARGE CONDITION:  Stable  ACTIVITY:  Activity as  tolerated  OXYGEN:  Home Oxygen: No.   Oxygen Delivery: room air  DISCHARGE LOCATION:  Home with [place], PT   If you experience worsening of your admission symptoms, develop shortness of breath, life threatening emergency, suicidal or homicidal thoughts you must seek medical attention immediately by calling 911 or calling your MD immediately  if symptoms less severe.  You Must read complete instructions/literature along with all the possible adverse reactions/side effects for all the Medicines you take and that have been prescribed to you. Take any new Medicines after you have completely understood and accpet all the possible adverse reactions/side effects.   Please note  You were cared for by a hospitalist during your hospital stay. If you have any questions about your discharge medications or the care you received while you were in the hospital after you are discharged, you can call  the unit and asked to speak with the hospitalist on call if the hospitalist that took care of you is not available. Once you are discharged, your primary care physician will handle any further medical issues. Please note that NO REFILLS for any discharge medications will be authorized once you are discharged, as it is imperative that you return to your primary care physician (or establish a relationship with a primary care physician if you do not have one) for your aftercare needs so that they can reassess your need for medications and monitor your lab values.     Today   No chest pain. BP on low side but not orthostatic.  Stress test was (-).    VITAL SIGNS:  Blood pressure (!) 94/44, pulse 76, temperature 97.7 F (36.5 C), temperature source Oral, resp. rate 18, height 5\' 7"  (1.702 m), weight 115.7 kg (255 lb), SpO2 97 %.  I/O:  No intake or output data in the 24 hours ending 08/03/17 1425  PHYSICAL EXAMINATION:   GENERAL:  82 y.o.-year-old obese patient lying in bed in no acute  distress.  EYES: Pupils equal, round, reactive to light and accommodation. No scleral icterus. Extraocular muscles intact.  HEENT: Head atraumatic, normocephalic. Oropharynx and nasopharynx clear.  NECK:  Supple, no jugular venous distention. No thyroid enlargement, no tenderness.  LUNGS: Normal breath sounds bilaterally, no wheezing, rales, rhonchi. No use of accessory muscles of respiration.  CARDIOVASCULAR: S1, S2 normal. No murmurs, rubs, or gallops.  ABDOMEN: Soft, nontender, nondistended. Bowel sounds present. No organomegaly or mass.  EXTREMITIES: No cyanosis, clubbing or edema b/l.    NEUROLOGIC: Cranial nerves II through XII are intact. No focal Motor or sensory deficits b/l.  Globally weak PSYCHIATRIC: The patient is alert and oriented x 3.  SKIN: No obvious rash, lesion, or ulcer.   DATA REVIEW:   CBC  Recent Labs Lab 08/01/17 0441  WBC 6.4  HGB 11.1*  HCT 32.8*  PLT 186    Chemistries   Recent Labs Lab 07/31/17 0849 08/01/17 0441  NA 130* 132*  K 5.0 3.5  CL 95* 95*  CO2 21* 26  GLUCOSE 100* 115*  BUN 38* 40*  CREATININE 2.23* 2.35*  CALCIUM 9.0 8.4*  AST 147*  --   ALT 82*  --   ALKPHOS 120  --   BILITOT 1.9*  --     Cardiac Enzymes  Recent Labs Lab 08/01/17 0105  TROPONINI 0.07*    RADIOLOGY:  Nm Myocar Multi W/spect W/wall Motion / Ef  Result Date: 08/02/2017 Pharmacological myocardial perfusion imaging study with no significant  Ischemia Small region of apical thinning, likely attenuation artifact Mild global hypokinesis, EF estimated at 33%, (estimated EF difficult to measure given GI uptake artifact) No EKG changes concerning for ischemia at peak stress or in recovery. Resting EKG with T-wave abnormality anterolateral leads, PVCs Low risk scan Signed, Esmond Plants, MD, Ph.D [facility]      Management plans discussed with the patient, family and they are in agreement.    TOTAL TIME TAKING CARE OF THIS PATIENT: 40 minutes.     Henreitta Leber M.D on 08/03/2017 at 2:25 PM  Between 7am to 6pm - Pager - [phone]  After 6pm go to [email or web address] - password [email or web address] [facility]  Office  [phone]  CC: Primary care physician; Marguerita Merles, MD

## 2017-08-05 ENCOUNTER — Emergency Department
Admission: EM | Admit: 2017-08-05 | Discharge: 2017-08-05 | Disposition: A | Discharge status: Home / Self Care | Payer: Medicare Other | Attending: Emergency Medicine | Admitting: Emergency Medicine

## 2017-08-05 ENCOUNTER — Encounter: Payer: Self-pay | Admitting: Emergency Medicine

## 2017-08-05 ENCOUNTER — Other Ambulatory Visit: Payer: Self-pay

## 2017-08-05 ENCOUNTER — Emergency Department: Payer: Medicare Other

## 2017-08-05 DIAGNOSIS — R1084 Generalized abdominal pain: Secondary | ICD-10-CM | POA: Diagnosis not present

## 2017-08-05 DIAGNOSIS — Z87891 Personal history of nicotine dependence: Secondary | ICD-10-CM | POA: Insufficient documentation

## 2017-08-05 DIAGNOSIS — J45909 Unspecified asthma, uncomplicated: Secondary | ICD-10-CM | POA: Diagnosis not present

## 2017-08-05 DIAGNOSIS — R197 Diarrhea, unspecified: Secondary | ICD-10-CM | POA: Insufficient documentation

## 2017-08-05 DIAGNOSIS — I129 Hypertensive chronic kidney disease with stage 1 through stage 4 chronic kidney disease, or unspecified chronic kidney disease: Secondary | ICD-10-CM | POA: Insufficient documentation

## 2017-08-05 DIAGNOSIS — Z7982 Long term (current) use of aspirin: Secondary | ICD-10-CM | POA: Diagnosis not present

## 2017-08-05 DIAGNOSIS — N183 Chronic kidney disease, stage 3 (moderate): Secondary | ICD-10-CM | POA: Diagnosis not present

## 2017-08-05 DIAGNOSIS — Z79899 Other long term (current) drug therapy: Secondary | ICD-10-CM | POA: Diagnosis not present

## 2017-08-05 DIAGNOSIS — R0789 Other chest pain: Secondary | ICD-10-CM | POA: Diagnosis not present

## 2017-08-05 LAB — COMPREHENSIVE METABOLIC PANEL
ALBUMIN: 3.2 g/dL — AB (ref 3.5–5.0)
ALT: 133 U/L — ABNORMAL HIGH (ref 17–63)
ANION GAP: 7 (ref 5–15)
AST: 133 U/L — ABNORMAL HIGH (ref 15–41)
Alkaline Phosphatase: 128 U/L — ABNORMAL HIGH (ref 38–126)
BUN: 40 mg/dL — ABNORMAL HIGH (ref 6–20)
CALCIUM: 8.1 mg/dL — AB (ref 8.9–10.3)
CO2: 23 mmol/L (ref 22–32)
CREATININE: 2.08 mg/dL — AB (ref 0.61–1.24)
Chloride: 102 mmol/L (ref 101–111)
GFR, EST AFRICAN AMERICAN: 33 mL/min — AB (ref >60)
GFR, EST NON AFRICAN AMERICAN: 28 mL/min — AB (ref >60)
Glucose, Bld: 121 mg/dL — ABNORMAL HIGH (ref 65–99)
Potassium: 4.1 mmol/L (ref 3.5–5.1)
SODIUM: 132 mmol/L — AB (ref 135–145)
TOTAL PROTEIN: 6.1 g/dL — AB (ref 6.5–8.1)
Total Bilirubin: 0.6 mg/dL (ref 0.3–1.2)

## 2017-08-05 LAB — CBC WITH DIFFERENTIAL/PLATELET
Basophils Absolute: 0.1 K/uL (ref 0–0.1)
Basophils Relative: 1 %
Eosinophils Absolute: 0.2 K/uL (ref 0–0.7)
Eosinophils Relative: 3 %
HCT: 30.7 % — ABNORMAL LOW (ref 40.0–52.0)
Hemoglobin: 10.5 g/dL — ABNORMAL LOW (ref 13.0–18.0)
Lymphocytes Relative: 17 %
Lymphs Abs: 1.1 K/uL (ref 1.0–3.6)
MCH: 29.7 pg (ref 26.0–34.0)
MCHC: 34.3 g/dL (ref 32.0–36.0)
MCV: 86.6 fL (ref 80.0–100.0)
Monocytes Absolute: 1.1 K/uL — ABNORMAL HIGH (ref 0.2–1.0)
Monocytes Relative: 17 %
Neutro Abs: 4.2 K/uL (ref 1.4–6.5)
Neutrophils Relative %: 62 %
Platelets: 189 K/uL (ref 150–440)
RBC: 3.55 MIL/uL — ABNORMAL LOW (ref 4.40–5.90)
RDW: 17.9 % — ABNORMAL HIGH (ref 11.5–14.5)
WBC: 6.7 K/uL (ref 3.8–10.6)

## 2017-08-05 LAB — TROPONIN I
TROPONIN I: 0.03 ng/mL — AB (ref <0.03)
Troponin I: 0.03 ng/mL (ref <0.03)

## 2017-08-05 MED ORDER — IOPAMIDOL (ISOVUE-300) INJECTION 61%: 15.0000 mL | INTRAVENOUS | Status: AC

## 2017-08-05 NOTE — ED Notes (Signed)
Patient transported to CT 

## 2017-08-05 NOTE — ED Notes (Signed)
Pt discharged to home. Discharge instructions reviewed.  Verbalized understanding.  No questions or concerns at this time.  Teach back verified.  Pt in NAD.  No items left in ED.  Signature pad not working at this time.

## 2017-08-05 NOTE — ED Provider Notes (Signed)
[facility] Emergency Department Provider Note ____________________________________________   I have reviewed the triage vital signs and the triage nursing note.  HISTORY  Chief Complaint Chest Pain and Diarrhea   Historian Patient  HPI [name] is a 82 y.o. male presenting for diarrhea and chest pain. Patient states that for about 10 days now he's been having watery diarrhea, nonbloody. He has crampy abdominal pain. He states that intermittently he's been having chest discomfort. He was admitted to the hospital for 2 days, discharged on Friday and was told he had a minor heart attack. Patient is not sure whether he was evaluated for the diarrhea which he felt like was the main problem.  Abdominal pain is currently mild, but at times when he is having diarrhea it is moderate to severe.  Patient states he had a few seconds of chest pain earlier when he was having a diarrheal bowel movement. No radiation. No shortness of breath.    Past Medical History:  Diagnosis Date  . Alcoholism ([place])   . Asthma   . Bipolar affective disorder ([place])   . Chronic combined systolic and diastolic CHF (congestive heart failure) ([place])    a. 08/2016 Echo: EF 40-45%, mild AS, mild to mod MR, mildly dil LA/RA, mild-mod TR.  . CKD (chronic kidney disease), stage III   . COPD (chronic obstructive pulmonary disease) (HCC)    COPD  . Coronary artery disease    a. 01/2004 s/p PCI and Taxus drug-eluting stent placement to the distal RCA (3.5 x 12 mm).  . Degenerative joint disease   . Essential hypertension   . Hyperlipidemia   . Hypertension   . Prostate CA [facility])    prostate ca dx 20 yrs ago  . PVD (peripheral vascular disease) ([place])   . Tobacco abuse     Patient Active Problem List   Diagnosis Date Noted  . Elevated troponin I level 07/31/2017  . Hyperkalemia 05/28/2017  . Syncope 05/27/2017  . Meningioma ([place]) 01/05/2017  . GI bleed 12/01/2016  . Alcohol abuse  10/30/2016  . Chronic systolic heart failure ([place]) 10/04/2016  . COPD (chronic obstructive pulmonary disease) with chronic bronchitis ([place]) 10/04/2016  . Obstructive sleep apnea 10/04/2016  . Hyponatremia 08/31/2016  . UTI (lower urinary tract infection) 08/31/2016  . Exertional dyspnea 02/11/2016  . Hyperlipidemia 12/03/2015  . Essential hypertension   . Coronary artery disease 01/19/2004    Past Surgical History:  Procedure Laterality Date  . CARDIAC CATHETERIZATION    . CORONARY ANGIOPLASTY WITH STENT PLACEMENT    . TOTAL HIP ARTHROPLASTY     right    Prior to Admission medications   Medication Sig Start Date End Date Taking? Authorizing Provider  albuterol (PROAIR HFA) 108 (90 Base) MCG/ACT inhaler Inhale 2 puffs into the lungs every 6 (six) hours as needed for wheezing or shortness of breath. Reported on 04/21/2016 05/31/17 10/05/18 Yes Loletha Grayer, MD  aspirin EC 81 MG tablet Take 81 mg by mouth every morning.   Yes [provider]  atorvastatin (LIPITOR) 40 MG tablet Take 40 mg by mouth daily.   Yes [provider]  fluticasone (FLONASE) 50 MCG/ACT nasal spray Place 1 spray into both nostrils daily. 05/31/17  Yes Wieting, Richard, MD  fluticasone-salmeterol (ADVAIR HFA) 161-09 MCG/ACT inhaler Inhale 2 puffs into the lungs 2 (two) times daily.   Yes [provider]  furosemide (LASIX) 40 MG tablet Take 0.5 tablets (20 mg total) by mouth 2 (two) times  daily. 09/13/16  Yes Dustin Flock, MD  isosorbide mononitrate (IMDUR) 30 MG 24 hr tablet Take 1 tablet (30 mg total) by mouth daily. 08/02/17  Yes Sainani, Belia Heman, MD  ketoconazole (NIZORAL) 2 % shampoo Apply 1 application topically 3 (three) times a week.  11/21/16  Yes [provider]  levothyroxine (SYNTHROID, LEVOTHROID) 50 MCG tablet Take 50 mcg by mouth daily before breakfast.   Yes [provider]  losartan (COZAAR) 25 MG tablet Take 25 mg by mouth daily. 05/18/17  Yes [provider]  magnesium oxide (MAG-OX) 400 MG tablet Take 400 mg by mouth 2 (two) times daily.    Yes [provider]  metoprolol (LOPRESSOR) 50 MG tablet Take 1 tablet (50 mg total) by mouth 2 (two) times daily. 09/06/16  Yes Epifanio Lesches, MD  nitroGLYCERIN (NITROSTAT) 0.4 MG SL tablet Take 0.4 mg by mouth every 5 (five) minutes x 3 doses as needed for chest pain. As needed for chest pain 12/06/15  Yes [provider]  polyethylene glycol (MIRALAX) packet Take 17 g by mouth daily. 05/31/17  Yes Wieting, Richard, MD  tiotropium (SPIRIVA HANDIHALER) 18 MCG inhalation capsule Place 18 mcg into inhaler and inhale daily.   Yes [provider]  vitamin B-6 (PYRIDOXINE) 25 MG tablet Take 25 mg by mouth daily.   Yes [provider]    Allergies  Allergen Reactions  . Amlodipine     Constipation   . Benazepril-Hydrochlorothiazide     Constipation   . Chlorthalidone     Hyponatremia  . Other Other (See Comments)    "ANY BLOOD PRESSURE MEDICATIONS THAT I'VE TRIED" - PT. DOES NOT REMEMBER WHICH ONES    Family History  Problem Relation Age of Onset  . Hypertension Mother   . Hyperlipidemia Mother   . Heart attack Mother   . Hypertension Father   . Hyperlipidemia Father   . Heart attack Father     Social History Social History  Substance Use Topics  . Smoking status: Former Smoker    Packs/day: 1.00    Years: 50.00    Types: Cigarettes  . Smokeless tobacco: Never Used  . Alcohol use Yes     Comment: social     Review of Systems  Constitutional: Negative for fever. Eyes: Negative for visual changes. ENT: Negative for sore throat. Cardiovascular: Positive for chest pain. Respiratory: Negative for shortness of breath. Gastrointestinal: Negative for vomiting. Genitourinary: Negative for dysuria. Musculoskeletal: Negative for back pain. Skin: Negative for rash. Neurological: Negative for  headache.  ____________________________________________   PHYSICAL EXAM:  VITAL SIGNS: ED Triage Vitals  Enc Vitals Group     BP 08/05/17 0654 (!) 154/71     Pulse Rate 08/05/17 0654 62     Resp 08/05/17 0654 16     Temp 08/05/17 0654 (!) 97.5 F (36.4 C)     Temp Source 08/05/17 0654 Oral     SpO2 08/05/17 0654 94 %     Weight 08/05/17 0655 255 lb (115.7 kg)     Height 08/05/17 0655 5\' 7"  (1.702 m)     Head Circumference --      Peak Flow --      Pain Score 08/05/17 0654 4     Pain Loc --      Pain Edu? --      Excl. in [place]? --      Constitutional: Alert and oriented. Well appearing and in no distress. HEENT   Head: Normocephalic  and atraumatic.      Eyes: Conjunctivae are normal. Pupils equal and round. Slight disconjugate gaze.      Ears:         Nose: No congestion/rhinnorhea.   Mouth/Throat: Mucous membranes are moist.   Neck: No stridor. Cardiovascular/Chest: Normal rate, regular rhythm.  No murmurs, rubs, or gallops. Respiratory: Normal respiratory effort without tachypnea nor retractions. Breath sounds are clear and equal bilaterally. No wheezes/rales/rhonchi. Gastrointestinal: Soft. No distention, no guarding, no rebound. Mild mid abdominal discomfort to palpation.  Genitourinary/rectal:Deferred Musculoskeletal: Nontender with normal range of motion in all extremities. No joint effusions.  No lower extremity tenderness.  No edema. Neurologic:  Normal speech and language. No gross or focal neurologic deficits are appreciated. Skin:  Skin is warm, dry and intact. No rash noted. Psychiatric: Mood and affect are normal. Speech and behavior are normal. Patient exhibits appropriate insight and judgment.   ____________________________________________  LABS (pertinent positives/negatives)  Labs Reviewed  TROPONIN I - Abnormal; Notable for the following:       Result Value   Troponin I 0.03 (*)    All other components within normal limits  CBC WITH  DIFFERENTIAL/PLATELET - Abnormal; Notable for the following:    RBC 3.55 (*)    Hemoglobin 10.5 (*)    HCT 30.7 (*)    RDW 17.9 (*)    Monocytes Absolute 1.1 (*)    All other components within normal limits  COMPREHENSIVE METABOLIC PANEL - Abnormal; Notable for the following:    Sodium 132 (*)    Glucose, Bld 121 (*)    BUN 40 (*)    Creatinine, Ser 2.08 (*)    Calcium 8.1 (*)    Total Protein 6.1 (*)    Albumin 3.2 (*)    AST 133 (*)    ALT 133 (*)    Alkaline Phosphatase 128 (*)    GFR calc non Af Amer 28 (*)    GFR calc Af Amer 33 (*)    All other components within normal limits  TROPONIN I - Abnormal; Notable for the following:    Troponin I 0.03 (*)    All other components within normal limits  GASTROINTESTINAL PANEL BY PCR, STOOL (REPLACES STOOL CULTURE)    ____________________________________________    EKG I, Lisa Roca, MD, the attending physician have personally viewed and interpreted all ECGs.  61 bpm narrow sinus rhythm. Narrow QRS. Normal axis. Normal ST and T-wave ____________________________________________  RADIOLOGY All Xrays were viewed by me. Imaging interpreted by Radiologist.  Chest x-ray two-view:  IMPRESSION: Lingular and left basilar opacities are favored to represent atelectasis, similar since previous studies. Recommend follow-up to [facility].  CT abdomen and pelvis with contrast:  IMPRESSION: 1. No acute findings within the abdomen or pelvis. No bowel inflammatory changes. 2. Hepatic steatosis. 3. Multiple renal cysts. 4. Left colon diverticulosis. 5. Aorta atherosclerosis. __________________________________________  PROCEDURES  Procedure(s) performed: None  Critical Care performed: None  ____________________________________________   ED COURSE / ASSESSMENT AND PLAN  Pertinent labs & imaging results that were available during my care of the patient were reviewed by me and considered in my medical decision making (see chart  for details).   Mr. Montesdeoca is here for recurrent/persistent diarrhea, denies bloody diarrhea. He has had no episode of chest pain and was recently diagnosed and 72.  EKG is unremarkable. Initial troponin is less than previous. I will send a repeat troponin although I suspect no ACS.  Patient states the diarrhea is been his  main concern, but when he is in the hospital doesn't sound like they were addressing the diarrhea.  If he has a bowel movement here, I will send it for stool studies.  Given abdominal cramping that is also there at baseline, I am going to CT his abdomen. Given his renal disease, I'm going to avoid IV contrast.  CT scan unremarkable. Repeat troponin reassuring.  I have asked patient. With his primary care doctor, I'm also going to give him information for referral to GI with ongoing diarrhea. He did not have a stool here, I will send him with a cup to collect a sample to bring to his pcp    CONSULTATIONS:  None   Patient / Family / Caregiver informed of clinical course, medical decision-making process, and agree with plan.   I discussed return precautions, follow-up instructions, and discharge instructions with patient and/or family.  Discharge Instructions : Although no certain cause was found for your diarrhea, I am recommending that you follow up with your primary care doctor as well as possibly gastroenterologist.  You exam and evaluation are reassuring in the emergency department stay.  Return to the emergency room immediately for any worsening symptoms including chest pain, trouble breathing, dizziness or passing out, black or bloody stools, vomiting blood, fever, or any other symptoms concerning to you.  ___________________________________________   FINAL CLINICAL IMPRESSION(S) / ED DIAGNOSES   Final diagnoses:  Diarrhea, unspecified type  Atypical chest pain              Note: This dictation was prepared with Dragon dictation. Any  transcriptional errors that result from this process are unintentional    Lisa Roca, MD 08/05/17 1129

## 2017-08-05 NOTE — Discharge Instructions (Addendum)
Although no certain cause was found for your diarrhea, I am recommending that you follow up with your primary care doctor as well as possibly gastroenterologist.  You exam and evaluation are reassuring in the emergency department stay.  Return to the emergency room immediately for any worsening symptoms including chest pain, trouble breathing, dizziness or passing out, black or bloody stools, vomiting blood, fever, or any other symptoms concerning to you.

## 2017-08-05 NOTE — ED Triage Notes (Signed)
Pt to rm 10 via ems from home, report CP starting 2am, gradual onset, central location, described as pressure w/ sob, dizziness, and nausea.  Pt also reports diarrhea x 2 days, too many times to count.  Pt give 324 ASA and 1 nitro spray en route.  EMS report improvement of pain w/ spray, report pt SBP initially 200s, improved to 120s w/ nitro.  Pt also given 4 zofran en route

## 2017-08-06 ENCOUNTER — Telehealth: Payer: Self-pay

## 2017-08-06 NOTE — Telephone Encounter (Signed)
Lmov for patient to call back  He was seen in ED on 08/05/17 for CP Will try again at a later time

## 2017-08-08 NOTE — Telephone Encounter (Signed)
Lmov for patient to call back  He was seen in ED on 08/05/17 for CP Will try again at a later time

## 2017-08-09 ENCOUNTER — Telehealth: Payer: Self-pay | Admitting: Cardiovascular Disease

## 2017-08-09 NOTE — Telephone Encounter (Signed)
Schedule next available and then send to [facility] in case there are any openings.   Thanks

## 2017-08-09 NOTE — Telephone Encounter (Signed)
Pt needs 7-14 day TCM appt. Please advise of availabilty

## 2017-08-09 NOTE — Telephone Encounter (Signed)
Sent letter

## 2017-08-10 NOTE — Telephone Encounter (Signed)
Pt scheduled for 09/04/17  Nothing further needed.

## 2017-08-10 NOTE — Telephone Encounter (Signed)
lmov to call office.

## 2017-08-10 NOTE — Telephone Encounter (Signed)
S/w pt's daughter, [place] 8/27 1:40 or 2:20 appt w/Dr. Fletcher Anon. Daughter is unable to bring pt to either appt. Transferred to [place] to schedule another time

## 2017-09-04 ENCOUNTER — Ambulatory Visit: Payer: Medicare Other | Admitting: Nurse Practitioner

## 2017-09-04 ENCOUNTER — Encounter: Payer: Self-pay | Admitting: Nurse Practitioner

## 2017-09-04 ENCOUNTER — Ambulatory Visit (INDEPENDENT_AMBULATORY_CARE_PROVIDER_SITE_OTHER): Payer: Medicare Other | Admitting: Nurse Practitioner

## 2017-09-04 ENCOUNTER — Telehealth: Payer: Self-pay | Admitting: Nurse Practitioner

## 2017-09-04 VITALS — BP 150/78 | HR 66 | Ht 67.0 in | Wt 281.2 lb

## 2017-09-04 DIAGNOSIS — E782 Mixed hyperlipidemia: Secondary | ICD-10-CM | POA: Diagnosis not present

## 2017-09-04 DIAGNOSIS — N183 Chronic kidney disease, stage 3 unspecified: Secondary | ICD-10-CM

## 2017-09-04 DIAGNOSIS — I2510 Atherosclerotic heart disease of native coronary artery without angina pectoris: Secondary | ICD-10-CM | POA: Diagnosis not present

## 2017-09-04 DIAGNOSIS — E871 Hypo-osmolality and hyponatremia: Secondary | ICD-10-CM

## 2017-09-04 DIAGNOSIS — I10 Essential (primary) hypertension: Secondary | ICD-10-CM

## 2017-09-04 DIAGNOSIS — I5042 Chronic combined systolic (congestive) and diastolic (congestive) heart failure: Secondary | ICD-10-CM

## 2017-09-04 MED ORDER — FUROSEMIDE 40 MG PO TABS: 20.0000 mg | ORAL_TABLET | Freq: Two times a day (BID) | ORAL | 3 refills | Status: DC

## 2017-09-04 MED ORDER — CARVEDILOL 12.5 MG PO TABS: 12.5000 mg | ORAL_TABLET | Freq: Two times a day (BID) | ORAL | 3 refills | Status: DC

## 2017-09-04 NOTE — Progress Notes (Signed)
Office Visit    Patient Name: JOSEPHINE RUDNICK Date of Encounter: 09/04/2017  Primary Care Provider:  Marguerita Merles, MD Primary Cardiologist:  Jerilynn Mages. Fletcher Anon, MD   Chief Complaint    82 year old male with a history of CAD, chronic combined heart failure, hypertension, hyperlipidemia, PVD, COPD, bipolar disorder, and obesity, who follows up after recent hospitalization with chest pain.  Past Medical History    Past Medical History:  Diagnosis Date  . Alcoholism ([place])   . Asthma   . Bipolar affective disorder ([place])   . Chronic combined systolic and diastolic CHF (congestive heart failure) ([place])    a. 08/2016 Echo: EF 40-45%, mild AS, mild to mod MR, mildly dil LA/RA, mild-mod TR; b. 07/2017 Echo: EF 40-45%, mod LVH, Gr1 DD, mild to mod AS, mildly dil LA, nl RV fxn.  . CKD (chronic kidney disease), stage III   . COPD (chronic obstructive pulmonary disease) (HCC)    COPD  . Coronary artery disease    a. 01/2004 s/p PCI and Taxus drug-eluting stent placement to the distal RCA (3.5 x 12 mm); b. 07/2017 Lexiscan MV: no ischemia. Sm area of apicl thinning, likely attenuation. EF 33% (GI uptake noted)-->Low risk.  . Degenerative joint disease   . Essential hypertension   . Hyperlipidemia   . Hypertension   . Prostate CA [facility])    prostate ca dx 20 yrs ago  . PVD (peripheral vascular disease) ([place])   . Tobacco abuse    Past Surgical History:  Procedure Laterality Date  . CARDIAC CATHETERIZATION    . CORONARY ANGIOPLASTY WITH STENT PLACEMENT    . TOTAL HIP ARTHROPLASTY     right    Allergies  Allergies  Allergen Reactions  . Amlodipine     Constipation   . Benazepril-Hydrochlorothiazide     Constipation   . Chlorthalidone     Hyponatremia  . Other Other (See Comments)    "ANY BLOOD PRESSURE MEDICATIONS THAT I'VE TRIED" - PT. DOES NOT REMEMBER WHICH ONES    History of Present Illness    82 year old male with above complex past medical history including coronary artery disease  status post drug-eluting stent placement to the distal RCA in February 2005. Other history includes hypertension, hyperlipidemia, obesity, chronic combined heart failure, peripheral vascular disease, bipolar disorder, and stage III chronic kidney disease. He was recently hospitalized at [facility] with complaints of fatigue, palpitations, leg pain, chest pain, and left-sided back pain. His troponin was mildly elevated at a peak of 0.13. He was seen by cardiology and echocardiogram was performed, showing stable LV dysfunction with an EF of 40-45%. This was followed by stress testing which was nonischemic. He was subsequently discharged and then seen back in the emergency room a few days later with diarrhea. He has since followed up with gastroenterology at [facility] and says his bowels are now regular and since his bowel habits have improved, he is feeling much better. He did notice one or 2 episodes of sharp and fleeting chest pain a few weeks ago but no recent recurrence. He has not had any significant dyspnea on exertion and denies PND, orthopnea, dizziness, syncope, edema, or early satiety.  Home Medications    Prior to Admission medications   Medication Sig Start Date End Date Taking? Authorizing Provider  albuterol (PROAIR HFA) 108 (90 Base) MCG/ACT inhaler Inhale 2 puffs into the lungs every 6 (six) hours as needed for wheezing or shortness of breath. Reported on 04/21/2016 05/31/17 10/05/18  Yes Loletha Grayer, MD  atorvastatin (LIPITOR) 40 MG tablet Take 40 mg by mouth daily.   Yes [provider]  fluticasone-salmeterol (ADVAIR HFA) 115-21 MCG/ACT inhaler Inhale 2 puffs into the lungs 2 (two) times daily.   Yes [provider]  isosorbide mononitrate (IMDUR) 30 MG 24 hr tablet Take 1 tablet (30 mg total) by mouth daily. 08/02/17  Yes Sainani, Belia Heman, MD  ketoconazole (NIZORAL) 2 % shampoo Apply 1 application topically 3 (three) times a week.  11/21/16  Yes [provider]    levothyroxine (SYNTHROID, LEVOTHROID) 50 MCG tablet Take 50 mcg by mouth daily before breakfast.   Yes [provider]  nitroGLYCERIN (NITROSTAT) 0.4 MG SL tablet Take 0.4 mg by mouth every 5 (five) minutes x 3 doses as needed for chest pain. As needed for chest pain 12/06/15  Yes [provider]  tiotropium (SPIRIVA HANDIHALER) 18 MCG inhalation capsule Place 18 mcg into inhaler and inhale daily.   Yes [provider]  vitamin B-6 (PYRIDOXINE) 25 MG tablet Take 25 mg by mouth daily.   Yes [provider]  carvedilol (COREG) 12.5 MG tablet Take 1 tablet (12.5 mg total) by mouth 2 (two) times daily. 09/04/17 12/03/17  Rogelia Mire, NP  furosemide (LASIX) 40 MG tablet Take 0.5 tablets (20 mg total) by mouth 2 (two) times daily. 09/04/17   Rogelia Mire, NP    Review of Systems    As above, he did note some sharp and fleeting chest pain occurring a few weeks ago. He denies dyspnea, palpitations, PND, orthopnea, dizziness, syncope, edema, or early satiety  All other systems reviewed and are otherwise negative except as noted above.  Physical Exam    VS:  BP (!) 150/78 (BP Location: Left Arm, Patient Position: Sitting, Cuff Size: Large)   Pulse 66   Ht 5\' 7"  (1.702 m)   Wt 281 lb 4 oz (127.6 kg)   BMI 44.05 kg/m  , BMI Body mass index is 44.05 kg/m. JJK:KXFGH, in no acute distress.  HEENT: normal.  Neck: Supple, obese, difficult to gauge JVP. No carotid bruits, or masses. Cardiac: RRR, 2/6 systolic ejection murmur loudest at the upper sternal border but heard throughout, no rubs, or gallops. No clubbing, cyanosis, edema.  Radials/DP/PT 2+ and equal bilaterally.  Respiratory:  Respirations regular and unlabored, scattered rhonchi. GI: Soft, obese, protuberant, nontender, nondistended, BS + x 4. MS: no deformity or atrophy. Skin: warm and dry, no rash. Neuro:  Strength and sensation are intact. Psych: Normal affect.  Accessory Clinical  Findings    ECG - Regular sinus rhythm, 66, PACs, LVH, no acute changes.  Assessment & Plan    1.  Coronary artery disease: Patient with prior history of RCA stenting in 2005. He was recently admitted with chest, back, and leg pain. He had mild troponin elevation. Echo showed stable LV dysfunction with an EF of 40-45%. Stress testing was nonischemic. He had a few episodes of atypical, sharp, fleeting chest pain since discharge but not any angina per se. Respiratory status has been stable. He remains on statin, nitrate, beta blocker therapy. He did ask if he should continue aspirin and given prior history coronary disease, I did recommend that he resume aspirin 81 mg daily. I'm going to switch him from metoprolol to carvedilol for better blood pressure control.  2. Essential hypertension: Blood pressure elevated today at 150/78. He has a list of blood pressures with him and these are all generally in  the same range. As above, I am switching his metoprolol to carvedilol 12.5 mg twice a day. He will continue to follow his blood pressure at home and call us within the next week if her pressures remain elevated. In that case, we may need to consider hydralazine therapy. He has prior history of constipation with amlodipine. He has stage III kidney disease with a creatinine of 2 and is followed by nephrology, thus I would defer any initiation of ARB/ACE inhibitor to them.  3. Ischemic cardiomyopathy/chronic combined systolic and diastolic heart failure: Patient is euvolemic on exam today. He does weigh himself at home and weights have been stable. He recently ran out of Lasix and is seeking refill through nephrology. He doesn't feel like he needs Lasix at this point. He remains on beta blocker therapy and I am switching this to carvedilol for better blood pressure control. He is on nitrate and we may need to consider initiation of hydralazine next blood pressure inadequately controlled.  4. Hyperlipidemia: LDL  was 97 and May 2017. Currently on atorvastatin therapy. This has been followed by primary care previously.  5. Hyponatremia: This was recently noted on labs at [facility]. I do not have access to these records. His family says that they were told to have his basic metabolic panel followed up when seen by [place]. I will arrange for this today.  6. Stage III chronic kidney disease: Following up basic metabolic panel today.  7. Peripheral vascular disease: No complaints of claudication. He remains on aspirin and statin therapy.  8. Disposition: Follow-up basic metabolic panel today. He will call us of blood pressures remain elevated. Otherwise, he will follow-up in the office in 3 months or sooner if necessary.    Murray Hodgkins, NP 09/04/2017, 12:48 PM

## 2017-09-04 NOTE — Telephone Encounter (Signed)
Spoke w/ pharmacy.   Advised of med changes made at ov today: 1- STOP Metoprolol. 2- START Coreg 12.5 mg (1 tablet) by mouth two times a day.  She will update pt's chart and call back w/ any further questions or concerns.

## 2017-09-04 NOTE — Patient Instructions (Signed)
Medication Instructions:  Your physician has recommended you make the following change in your medication:  1- STOP Metoprolol. 2- START Coreg 12.5 mg (1 tablet) by mouth two times a day.   Labwork: Your physician recommends that you return for lab work in: [place] (BMET).   Testing/Procedures: none  Follow-Up: Your physician recommends that you schedule a follow-up appointment in: [place].   If you need a refill on your cardiac medications before your next appointment, please call your pharmacy.

## 2017-09-04 NOTE — Telephone Encounter (Signed)
Pharmacy calling to confirm medication change   Patient saw berge today and new rx was sent for Carvedilol   Pharmacy wants to confirm medications he is currently taking.

## 2017-09-05 ENCOUNTER — Other Ambulatory Visit: Payer: Self-pay | Admitting: *Deleted

## 2017-09-05 DIAGNOSIS — E875 Hyperkalemia: Secondary | ICD-10-CM

## 2017-09-05 LAB — BASIC METABOLIC PANEL
BUN/Creatinine Ratio: 12 (ref 10–24)
BUN: 18 mg/dL (ref 8–27)
CALCIUM: 8.7 mg/dL (ref 8.6–10.2)
CO2: 19 mmol/L — AB (ref 20–29)
CREATININE: 1.49 mg/dL — AB (ref 0.76–1.27)
Chloride: 100 mmol/L (ref 96–106)
GFR calc Af Amer: 51 mL/min/{1.73_m2} — ABNORMAL LOW (ref >59)
GFR, EST NON AFRICAN AMERICAN: 44 mL/min/{1.73_m2} — AB (ref >59)
Glucose: 86 mg/dL (ref 65–99)
Potassium: 5.5 mmol/L — ABNORMAL HIGH (ref 3.5–5.2)
Sodium: 136 mmol/L (ref 134–144)

## 2017-09-12 ENCOUNTER — Other Ambulatory Visit
Admission: RE | Admit: 2017-09-12 | Discharge: 2017-09-12 | Disposition: A | Discharge status: Home / Self Care | Payer: Medicare Other | Source: Ambulatory Visit | Attending: Nurse Practitioner | Admitting: Nurse Practitioner

## 2017-09-12 DIAGNOSIS — E875 Hyperkalemia: Secondary | ICD-10-CM | POA: Diagnosis present

## 2017-09-12 LAB — BASIC METABOLIC PANEL
Anion gap: 10 (ref 5–15)
BUN: 29 mg/dL — AB (ref 6–20)
CALCIUM: 8.9 mg/dL (ref 8.9–10.3)
CO2: 24 mmol/L (ref 22–32)
CREATININE: 1.97 mg/dL — AB (ref 0.61–1.24)
Chloride: 97 mmol/L — ABNORMAL LOW (ref 101–111)
GFR calc Af Amer: 35 mL/min — ABNORMAL LOW (ref >60)
GFR, EST NON AFRICAN AMERICAN: 30 mL/min — AB (ref >60)
Glucose, Bld: 92 mg/dL (ref 65–99)
POTASSIUM: 4.9 mmol/L (ref 3.5–5.1)
SODIUM: 131 mmol/L — AB (ref 135–145)

## 2017-10-09 ENCOUNTER — Ambulatory Visit: Payer: Medicare Other | Admitting: Nurse Practitioner

## 2017-12-03 ENCOUNTER — Encounter: Payer: Self-pay | Admitting: Urology

## 2017-12-03 ENCOUNTER — Other Ambulatory Visit: Payer: Self-pay

## 2017-12-03 ENCOUNTER — Ambulatory Visit (INDEPENDENT_AMBULATORY_CARE_PROVIDER_SITE_OTHER): Payer: Medicare Other | Admitting: Urology

## 2017-12-03 VITALS — BP 177/73 | HR 76 | Ht 67.0 in | Wt 288.5 lb

## 2017-12-03 DIAGNOSIS — I2510 Atherosclerotic heart disease of native coronary artery without angina pectoris: Secondary | ICD-10-CM | POA: Diagnosis not present

## 2017-12-03 DIAGNOSIS — R339 Retention of urine, unspecified: Secondary | ICD-10-CM | POA: Diagnosis not present

## 2017-12-03 MED ORDER — ALFUZOSIN HCL ER 10 MG PO TB24: 10.0000 mg | ORAL_TABLET | Freq: Every day | ORAL | 11 refills | Status: DC

## 2017-12-03 NOTE — Progress Notes (Signed)
12/03/2017 1:00 PM   [name] [birth year] [id number]  Referring provider: Marguerita Merles, [employer] [place], [place] [address]  No chief complaint on file.   HPI:   Patient here to establish care.  He is an 82 year old African-American male with a history of prostate cancer treated with radiation about 1994 with a PSA <0.1 in Nov 15, 2017 (PCP report). He's been told he had "polyps in the bladder". He also had erectile dysfunction and had an inflatable penile prosthesis placed around 2010 and then needed a revision in 2014 with Dr. Odis Luster at [facility]. He said the IPP won't pump up anymore. He has chronic kidney disease with a creatinine around 2 and a GFR of 35. Recently, he's had weak stream. His PVR is 178 ml today. He's had no dysuria or gross hematuria.    PMH: Past Medical History:  Diagnosis Date  . Alcoholism ([place])   . Asthma   . Bipolar affective disorder ([place])   . Chronic combined systolic and diastolic CHF (congestive heart failure) ([place])    a. 08/2016 Echo: EF 40-45%, mild AS, mild to mod MR, mildly dil LA/RA, mild-mod TR; b. 07/2017 Echo: EF 40-45%, mod LVH, Gr1 DD, mild to mod AS, mildly dil LA, nl RV fxn.  . CKD (chronic kidney disease), stage III   . COPD (chronic obstructive pulmonary disease) (HCC)    COPD  . Coronary artery disease    a. 01/2004 s/p PCI and Taxus drug-eluting stent placement to the distal RCA (3.5 x 12 mm); b. 07/2017 Lexiscan MV: no ischemia. Sm area of apicl thinning, likely attenuation. EF 33% (GI uptake noted)-->Low risk.  . Degenerative joint disease   . Essential hypertension   . Hyperlipidemia   . Hypertension   . Prostate CA [facility])    prostate ca dx 20 yrs ago  . PVD (peripheral vascular disease) ([place])   . Tobacco abuse     Surgical History: Past Surgical History:  Procedure Laterality Date  . CARDIAC CATHETERIZATION    . CORONARY ANGIOPLASTY WITH STENT PLACEMENT    . TOTAL HIP ARTHROPLASTY     right    Home  Medications:  Allergies as of 12/03/2017      Reactions   Amlodipine    Constipation   Benazepril-hydrochlorothiazide    Constipation   Chlorthalidone    Hyponatremia   Other Other (See Comments)   "ANY BLOOD PRESSURE MEDICATIONS THAT I'VE TRIED" - PT. DOES NOT REMEMBER WHICH ONES      Medication List        Accurate as of 12/03/17  1:00 PM. Always use your most recent med list.          albuterol 108 (90 Base) MCG/ACT inhaler Commonly known as:  PROAIR HFA Inhale 2 puffs into the lungs every 6 (six) hours as needed for wheezing or shortness of breath. Reported on 04/21/2016   atorvastatin 40 MG tablet Commonly known as:  LIPITOR Take 40 mg by mouth daily.   carvedilol 12.5 MG tablet Commonly known as:  COREG Take 1 tablet (12.5 mg total) by mouth 2 (two) times daily.   fluticasone-salmeterol 115-21 MCG/ACT inhaler Commonly known as:  ADVAIR HFA Inhale 2 puffs into the lungs 2 (two) times daily.   furosemide 40 MG tablet Commonly known as:  LASIX Take 0.5 tablets (20 mg total) by mouth 2 (two) times daily.   isosorbide mononitrate 30 MG 24 hr tablet Commonly known as:  IMDUR Take  1 tablet (30 mg total) by mouth daily.   ketoconazole 2 % shampoo Commonly known as:  NIZORAL Apply 1 application topically 3 (three) times a week.   levothyroxine 50 MCG tablet Commonly known as:  SYNTHROID, LEVOTHROID Take 50 mcg by mouth daily before breakfast.   NITROSTAT 0.4 MG SL tablet Generic drug:  nitroGLYCERIN Take 0.4 mg by mouth every 5 (five) minutes x 3 doses as needed for chest pain. As needed for chest pain   SPIRIVA HANDIHALER 18 MCG inhalation capsule Generic drug:  tiotropium Place 18 mcg into inhaler and inhale daily.   vitamin B-6 25 MG tablet Commonly known as:  pyridOXINE Take 25 mg by mouth daily.       Allergies:  Allergies  Allergen Reactions  . Amlodipine     Constipation   . Benazepril-Hydrochlorothiazide     Constipation   .  Chlorthalidone     Hyponatremia  . Other Other (See Comments)    "ANY BLOOD PRESSURE MEDICATIONS THAT I'VE TRIED" - PT. DOES NOT REMEMBER WHICH ONES    Family History: Family History  Problem Relation Age of Onset  . Hypertension Mother   . Hyperlipidemia Mother   . Heart attack Mother   . Hypertension Father   . Hyperlipidemia Father   . Heart attack Father     Social History:  reports that he has quit smoking. His smoking use included cigarettes. He has a 50.00 pack-year smoking history. he has never used smokeless tobacco. He reports that he drinks alcohol. He reports that he does not use drugs.  ROS:                                        Physical Exam: There were no vitals taken for this visit.  Constitutional:  Alert and oriented, No acute distress. HEENT: [place] AT, moist mucus membranes.  Trachea midline, no masses. Cardiovascular: No clubbing, cyanosis, or edema. Respiratory: Normal respiratory effort, no increased work of breathing. GI: Abdomen is soft, nontender, nondistended, no abdominal masses GU: No CVA tenderness. IPP doesn't cycle normally - seemed to have air but partially inflated. It deflated but I couldn't get it to reinflate.  Skin: No rashes, bruises or suspicious lesions. Neurologic: Grossly intact, no focal deficits, moving all 4 extremities. Psychiatric: Normal mood and affect.  Laboratory Data: Lab Results  Component Value Date   WBC 6.7 08/05/2017   HGB 10.5 (L) 08/05/2017   HCT 30.7 (L) 08/05/2017   MCV 86.6 08/05/2017   PLT 189 08/05/2017    Lab Results  Component Value Date   CREATININE 1.97 (H) 09/12/2017    No results found for: PSA1  No results found for: TESTOSTERONE  Lab Results  Component Value Date   HGBA1C  06/13/2009    5.8 (NOTE) The ADA recommends the following therapeutic goal for glycemic control related to Hgb A1c measurement: Goal of therapy: <6.5 Hgb A1c  Reference: American Diabetes  Association: Clinical Practice Recommendations 2010, Diabetes Care, 2010, 33: (Suppl  1).    Urinalysis Lab Results  Component Value Date   APPEARANCEUR CLEAR (A) 07/31/2017   LEUKOCYTESUR NEGATIVE 07/31/2017   PROTEINUR 100 (A) 07/31/2017   GLUCOSEU NEGATIVE 07/31/2017   RBCU 0-5 07/31/2017   BILIRUBINUR NEGATIVE 07/31/2017   NITRITE NEGATIVE 07/31/2017    Lab Results  Component Value Date   BACTERIA MANY (A) 07/31/2017    Results for  orders placed during the hospital encounter of 08/31/16  US Renal   Narrative CLINICAL DATA:  Acute renal failure.  EXAM: RENAL / URINARY TRACT ULTRASOUND COMPLETE  COMPARISON:  CT, 01/01/2016  FINDINGS: Right Kidney:  Length: 12.8 cm. Increased parenchymal echogenicity. Multiple cysts the. In the midpole is a 3.1 x 2.2 x 2.3 cm cyst. In the lower pole there is a cyst which have thin septations but no other complicating features. It measures 5.2 x 3.4 x 3.3 cm. Also in the lower pole is a hypo echoic mass without internal blood flow. This is seen posteriorly. It is likely a complicated cyst. It measures 2.7 x 2.0 x 1.7 cm.  No hydronephrosis appear  Left Kidney:  Length: 13.8 cm. Increased parenchymal echogenicity. Multiple cyst. In the posterior upper pole, there is a hypoechoic oval mass with internal blood flow measuring 3.4 x 1.9 x 2.1 cm. The also in the upper pole is a cyst with thin septations measuring 3.8 x 3.5 x 4.0 cm. Also in upper pole is a simple appearing cyst measuring 3.2 x 4.9 x 5.5 cm.  No hydronephrosis.  Bladder:  Appears normal for degree of bladder distention.  IMPRESSION: 1. No acute finding.  No hydronephrosis. 2. Increased renal parenchyma echogenicity is consistent with medical renal disease. 3. Bilateral renal cysts. Probable complicated cyst the lower pole the right kidney. 4. Possible solid mass with apparent internal blood flow within the posterior upper pole of the left kidney. Recommend  further assessment with renal MRI with and without contrast.   Electronically Signed   By: Lajean Manes M.D.   On: 09/02/2016 11:58    No results found for this or any previous visit. No results found for this or any previous visit. No results found for this or any previous visit.  Assessment & Plan:    1) PCa - PSa was sent. Pt pet recent PSA was "OK".   2) weak stream - trial of tamsulosin. Recheck PVR.   3) IPP - he had an IPP placed and revision at [facility] in 2014 and it may not be cycling. I'll have him f/u with Dr. Bernardo Heater to cycle it and determine if we could do a revision here or if pt would need to go back to [facility] or [facility].   There are no diagnoses linked to this encounter.  No Follow-up on file.  Festus Aloe, MD  [facility] [address] [place], [place] [address] [phone]

## 2017-12-05 ENCOUNTER — Telehealth: Payer: Self-pay

## 2017-12-05 ENCOUNTER — Other Ambulatory Visit: Payer: Self-pay

## 2017-12-05 MED ORDER — TAMSULOSIN HCL 0.4 MG PO CAPS: 0.4000 mg | ORAL_CAPSULE | Freq: Every day | ORAL | 3 refills | Status: DC

## 2017-12-05 NOTE — Telephone Encounter (Signed)
Pharmacist at [facility] called stating uroxatrol was called in for pt but insurance will not cover. Pharmacist asked about another medication or completing PA. Read Dr. Junious Silk dictation and tamsulosin was to be called in. Escribed medication. When medication was put into escribe 2 BP meds came up as allergies. Pharmacist stated she would call pt about a true allergy and call back to make aware if a PA needs to be completed or if pt will take tamsulosin.

## 2017-12-07 ENCOUNTER — Ambulatory Visit (INDEPENDENT_AMBULATORY_CARE_PROVIDER_SITE_OTHER): Payer: Medicare Other | Admitting: Cardiovascular Disease

## 2017-12-07 ENCOUNTER — Encounter: Payer: Self-pay | Admitting: Cardiovascular Disease

## 2017-12-07 VITALS — BP 150/70 | HR 65 | Ht 67.0 in | Wt 291.2 lb

## 2017-12-07 DIAGNOSIS — I10 Essential (primary) hypertension: Secondary | ICD-10-CM

## 2017-12-07 DIAGNOSIS — E7849 Other hyperlipidemia: Secondary | ICD-10-CM | POA: Diagnosis not present

## 2017-12-07 DIAGNOSIS — I5022 Chronic systolic (congestive) heart failure: Secondary | ICD-10-CM

## 2017-12-07 DIAGNOSIS — I209 Angina pectoris, unspecified: Secondary | ICD-10-CM

## 2017-12-07 DIAGNOSIS — I25119 Atherosclerotic heart disease of native coronary artery with unspecified angina pectoris: Secondary | ICD-10-CM

## 2017-12-07 MED ORDER — ISOSORBIDE MONONITRATE ER 30 MG PO TB24: 30.0000 mg | ORAL_TABLET | Freq: Every day | ORAL | 3 refills | Status: DC

## 2017-12-07 MED ORDER — FUROSEMIDE 20 MG PO TABS
ORAL_TABLET | ORAL | 3 refills | Status: DC
Start: 2017-12-07 — End: 2018-04-24

## 2017-12-07 MED ORDER — HYDRALAZINE HCL 25 MG PO TABS: 25.0000 mg | ORAL_TABLET | Freq: Three times a day (TID) | ORAL | 3 refills | Status: DC

## 2017-12-07 MED ORDER — HYDRALAZINE HCL 25 MG PO TABS: 25.0000 mg | ORAL_TABLET | Freq: Two times a day (BID) | ORAL | 3 refills | Status: DC

## 2017-12-07 NOTE — Patient Instructions (Addendum)
Medication Instructions:  Your physician has recommended you make the following change in your medication:  INCREASE lasix to 2 tablets (40mg ) in the morning and 1 tablet (20mg ) in the evening. START taking hydralazine 25mg  twice daily   Labwork: BMET in one week at the [facility]. No appt needed  Testing/Procedures: none  Follow-Up: Your physician recommends that you schedule a follow-up appointment in: 3 months with Ignacia Bayley, NP.   Any Other Special Instructions Will Be Listed Below (If Applicable).     If you need a refill on your cardiac medications before your next appointment, please call your pharmacy.

## 2017-12-07 NOTE — Progress Notes (Signed)
Cardiology Office Note   Date:  12/07/2017   ID:   SHELLHAMMER, DOB 22-Nov-1935, MRN [id number]  PCP:  Marguerita Merles, MD  Cardiologist:   Kathlyn Sacramento, MD   Chief Complaint  Patient presents with  . other    3 month follow up. Pt. c/o shortness of breath with occas. chest pain/indigestion.       History of Present Illness: [name] is a 82 y.o. male who presents for a follow-up visit regarding hypertension and coronary artery disease. He is status post Taxus drug-eluting stent placement to the distal right coronary artery in 2005.  He has extensive medical problems that include borderline diabetes, hypertension, hyperlipidemia, chronic systolic/diastolic heart failure sleep apnea on CPAP, morbid obesity, previous tobacco use, bipolar disorder and peripheral arterial disease. Most recent echocardiogram in August showed an EF of 40-45%, mild to moderate aortic stenosis, mildly dilated right and left atrium. He had a nuclear stress test done in August 2018 for chest pain which showed no evidence of ischemia.  Patient has been doing reasonably well with stable dyspnea no chest pain.  He gained 10 pounds since last visit and he attributes that to consuming large amount of sweets.  He has worsening of leg edema.  He has been taking his medication regularly.  He monitors blood pressure and weight daily at home.   Past Medical History:  Diagnosis Date  . Alcoholism ([place])   . Asthma   . Bipolar affective disorder ([place])   . Chronic combined systolic and diastolic CHF (congestive heart failure) ([place])    a. 08/2016 Echo: EF 40-45%, mild AS, mild to mod MR, mildly dil LA/RA, mild-mod TR; b. 07/2017 Echo: EF 40-45%, mod LVH, Gr1 DD, mild to mod AS, mildly dil LA, nl RV fxn.  . CKD (chronic kidney disease), stage III ([place])   . COPD (chronic obstructive pulmonary disease) ([facility])    COPD  . Coronary artery disease    a. 01/2004 s/p PCI and Taxus drug-eluting stent placement to the distal  RCA (3.5 x 12 mm); b. 07/2017 Lexiscan MV: no ischemia. Sm area of apicl thinning, likely attenuation. EF 33% (GI uptake noted)-->Low risk.  . Degenerative joint disease   . Essential hypertension   . Hyperlipidemia   . Hypertension   . Prostate CA [facility])    prostate ca dx 20 yrs ago  . PVD (peripheral vascular disease) ([place])   . Tobacco abuse     Past Surgical History:  Procedure Laterality Date  . CARDIAC CATHETERIZATION    . CORONARY ANGIOPLASTY WITH STENT PLACEMENT    . TOTAL HIP ARTHROPLASTY     right     Current Outpatient Medications  Medication Sig Dispense Refill  . albuterol (PROAIR HFA) 108 (90 Base) MCG/ACT inhaler Inhale 2 puffs into the lungs every 6 (six) hours as needed for wheezing or shortness of breath. Reported on 04/21/2016 1 Inhaler 0  . aspirin EC 81 MG tablet Take 81 mg by mouth daily.    Marland Kitchen atorvastatin (LIPITOR) 40 MG tablet Take 40 mg by mouth daily.    . carvedilol (COREG) 12.5 MG tablet Take 1 tablet (12.5 mg total) by mouth 2 (two) times daily. 180 tablet 3  . fluticasone-salmeterol (ADVAIR HFA) 115-21 MCG/ACT inhaler Inhale 2 puffs into the lungs 2 (two) times daily.    . furosemide (LASIX) 40 MG tablet Take 0.5 tablets (20 mg total) by mouth 2 (two) times daily. 90 tablet 3  . isosorbide  mononitrate (IMDUR) 30 MG 24 hr tablet Take 1 tablet (30 mg total) by mouth daily.    Marland Kitchen ketoconazole (NIZORAL) 2 % shampoo Apply 1 application topically 3 (three) times a week.     . levothyroxine (SYNTHROID, LEVOTHROID) 50 MCG tablet Take 50 mcg by mouth daily before breakfast.    . nitroGLYCERIN (NITROSTAT) 0.4 MG SL tablet Take 0.4 mg by mouth every 5 (five) minutes x 3 doses as needed for chest pain. As needed for chest pain    . tamsulosin (FLOMAX) 0.4 MG CAPS capsule Take 1 capsule (0.4 mg total) by mouth daily. 30 capsule 3  . tiotropium (SPIRIVA HANDIHALER) 18 MCG inhalation capsule Place 18 mcg into inhaler and inhale daily.    . vitamin B-6 (PYRIDOXINE) 25 MG  tablet Take 25 mg by mouth daily.     No current facility-administered medications for this visit.     Allergies:   Amlodipine; Benazepril-hydrochlorothiazide; Chlorthalidone; and Other    Social History:  The patient  reports that he has quit smoking. His smoking use included cigarettes. He has a 50.00 pack-year smoking history. he has never used smokeless tobacco. He reports that he drinks alcohol. He reports that he does not use drugs.   Family History:  The patient's family history includes Heart attack in his father and mother; Hyperlipidemia in his father and mother; Hypertension in his father and mother.    ROS:  Please see the history of present illness.   Otherwise, review of systems are positive for none.   All other systems are reviewed and negative.    PHYSICAL EXAM: VS:  BP (!) 150/70 (BP Location: Left Arm, Patient Position: Sitting, Cuff Size: Large)   Pulse 65   Ht 5\' 7"  (1.702 m)   Wt 291 lb 4 oz (132.1 kg)   BMI 45.62 kg/m  , BMI Body mass index is 45.62 kg/m. GEN: Well nourished, well developed, in no acute distress  HEENT: normal  Neck: no JVD, carotid bruits, or masses Cardiac: RRR; no murmurs, rubs, or gallops, mild edema  Respiratory:  clear to auscultation bilaterally, normal work of breathing GI: soft, nontender, nondistended, + BS MS: no deformity or atrophy  Skin: warm and dry, no rash Neuro:  Strength and sensation are intact Psych: euthymic mood, full affect   EKG:  EKG is ordered today. The ekg ordered today demonstrates normal sinus rhythm with LVH and nonspecific T wave changes.   Recent Labs: 07/31/2017: B Natriuretic Peptide 457.0 08/05/2017: ALT 133; Hemoglobin 10.5; Platelets 189 09/12/2017: BUN 29; Creatinine, Ser 1.97; Potassium 4.9; Sodium 131    Lipid Panel    Component Value Date/Time   CHOL 189 04/26/2016 0810   CHOL 138 05/31/2014 0416   TRIG 129 04/26/2016 0810   TRIG 75 05/31/2014 0416   HDL 66 04/26/2016 0810   HDL 40  05/31/2014 0416   CHOLHDL 2.9 04/26/2016 0810   CHOLHDL 2.3 10/30/2010 0430   VLDL 15 05/31/2014 0416   LDLCALC 97 04/26/2016 0810   LDLCALC 83 05/31/2014 0416      Wt Readings from Last 3 Encounters:  12/07/17 291 lb 4 oz (132.1 kg)  12/03/17 288 lb 8 oz (130.9 kg)  09/04/17 281 lb 4 oz (127.6 kg)      No flowsheet data found.    ASSESSMENT AND PLAN:  1.   Coronary artery disease involving native coronary arteries with mild angina: Controlled with medications overall.  Stress test in August was low risk.  2.  Chronic diastolic/systolic heart failure:  Most recent ejection fraction was 40-45%.  He appears to be mildly volume overloaded.  He gained 10 pounds since last visit.  I elected to change furosemide to 40 mg in the morning and 20 mg in the afternoon.  We will check basic metabolic profile in 1 week. Continue treatment with carvedilol and Imdur.  I elected to add hydralazine 25 mg twice daily.  No ACE inhibitor or ARB due to chronic kidney disease.   3. Essential hypertension: Blood pressure is elevated.  Hydralazine was added.  4. Hyperlipidemia: Continue treatment with atorvastatin with a target LDL of less than 70.  He will require a follow-up lipid profile.   Disposition:   FU in 3 months  Signed,  Kathlyn Sacramento, MD  12/07/2017 11:52 AM    [place]

## 2017-12-09 IMAGING — CT CT ABD-PELV W/O CM
2 of 4 series · 16 of 46 positions shown, 18 images · non-contrast
Comparison: None.

CLINICAL DATA: Generalized abdominal pain, bloody diarrhea for 1
week.

EXAM:
CT ABDOMEN AND PELVIS WITHOUT CONTRAST
TECHNIQUE: Multidetector CT imaging of the abdomen and pelvis was performed
following the standard protocol without IV contrast.

[Series 2: routine abd/pel wo · axial · 0.95mm/px · z∈[-918,-408]mm · 13 of 112 slices shown, 15 images]
[im 5/112  soft-tissue]
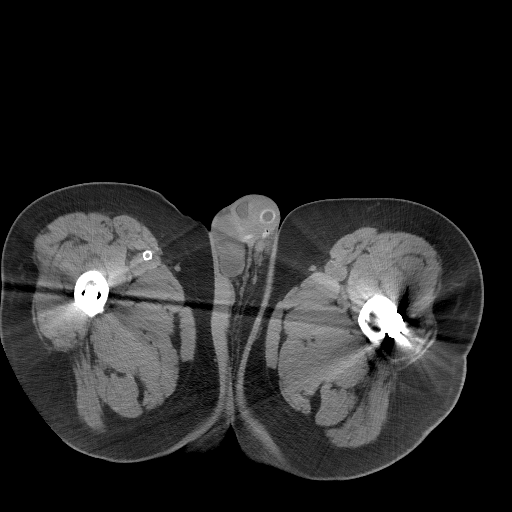
[im 5/112  bone]
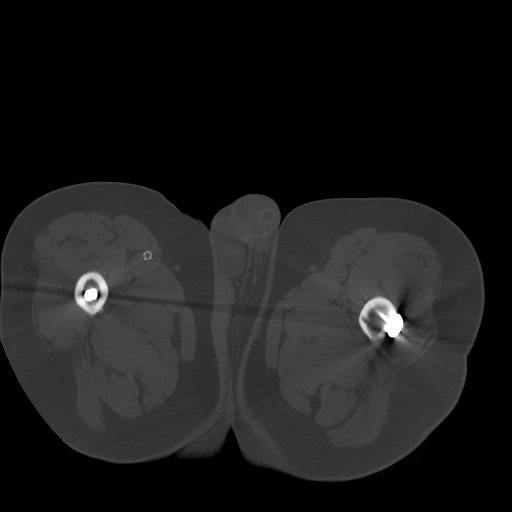
[im 14/112  soft-tissue]
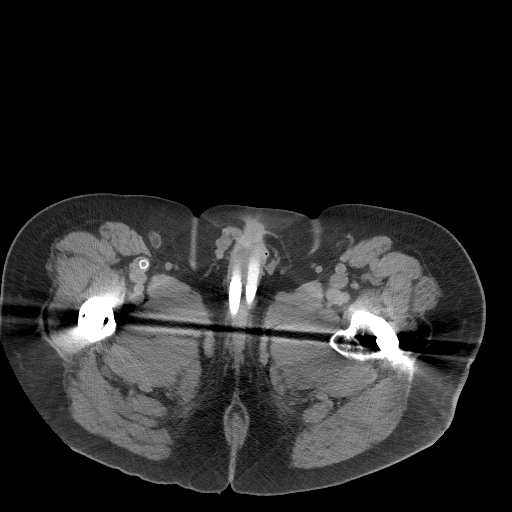
[im 24/112  soft-tissue]
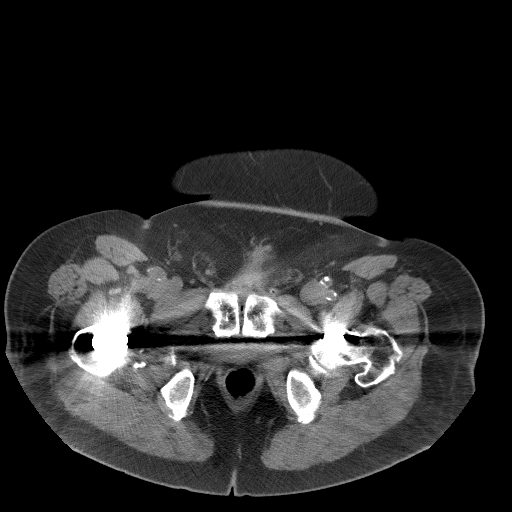
[im 33/112  soft-tissue]
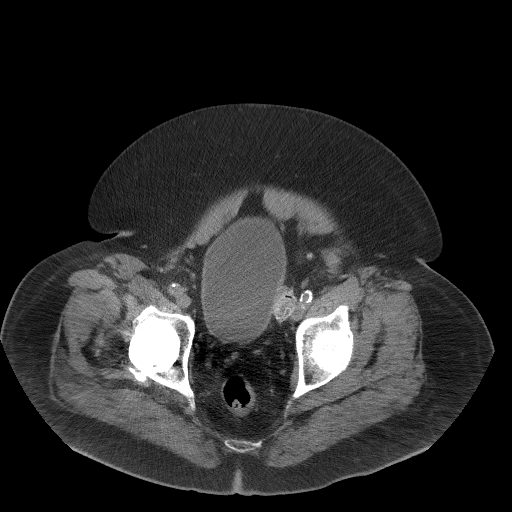
[im 38/112  soft-tissue]
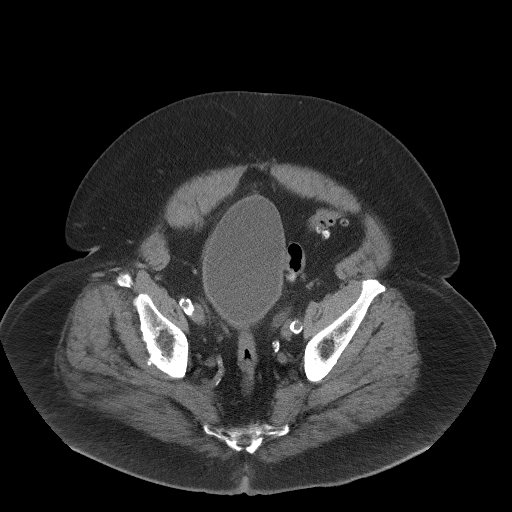
[im 47/112  soft-tissue]
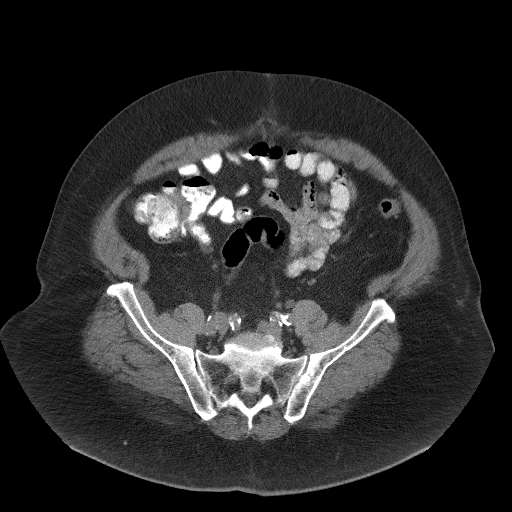
[im 56/112  soft-tissue]
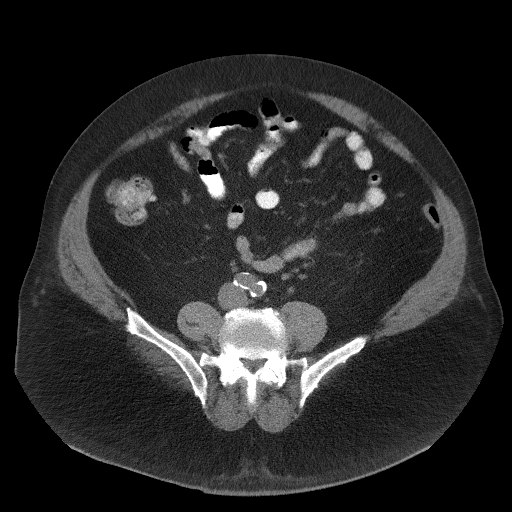
[im 65/112  soft-tissue]
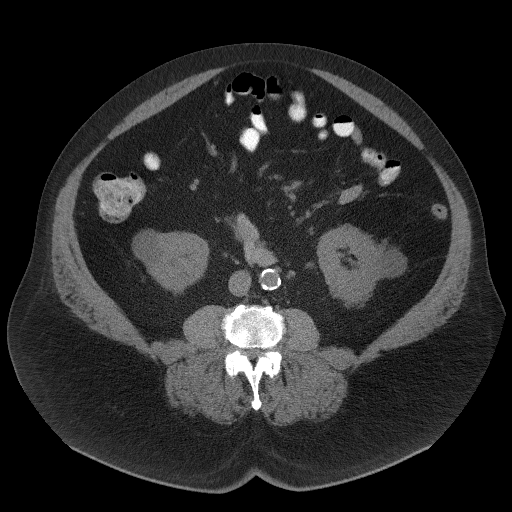
[im 75/112  soft-tissue]
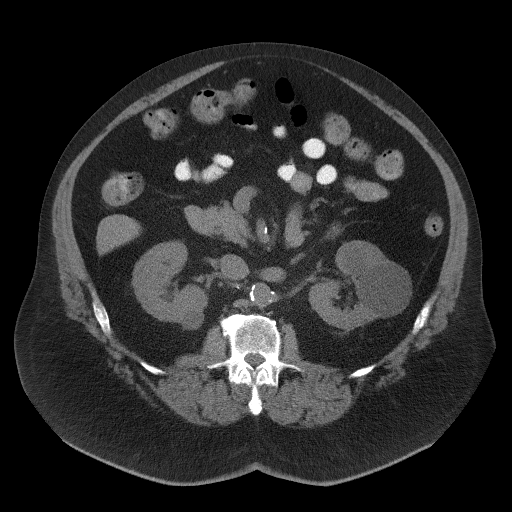
[im 75/112  bone]
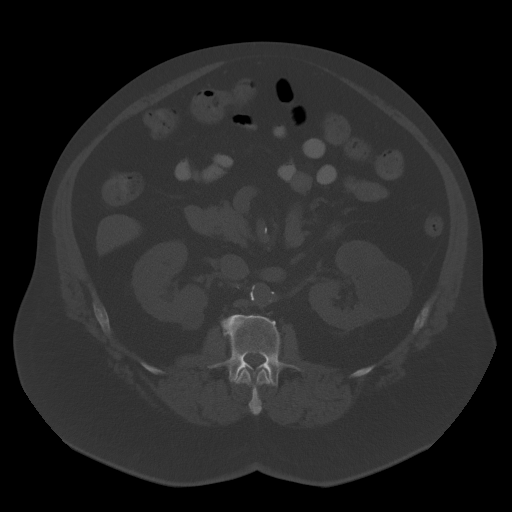
[im 79/112  soft-tissue]
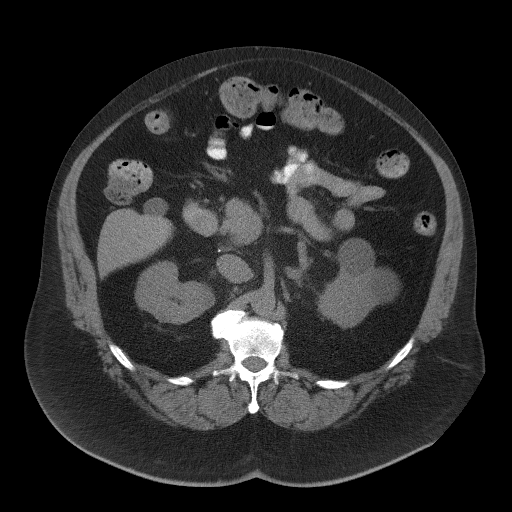
[im 88/112  soft-tissue]
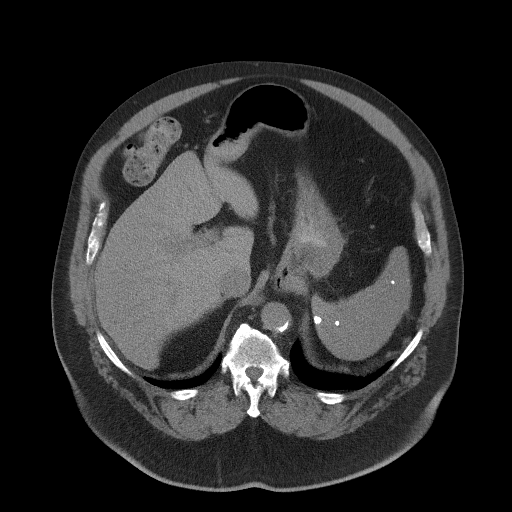
[im 98/112  soft-tissue]
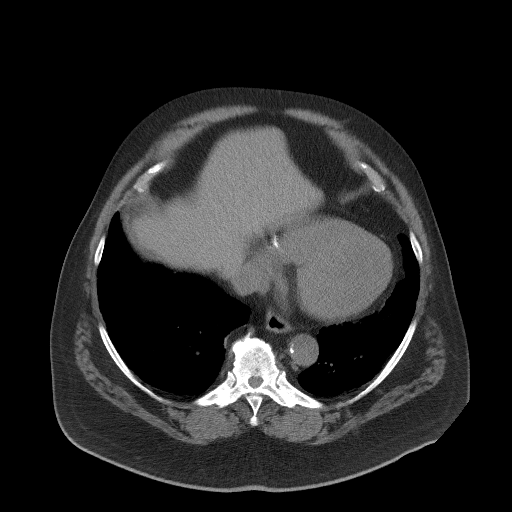
[im 107/112  soft-tissue]
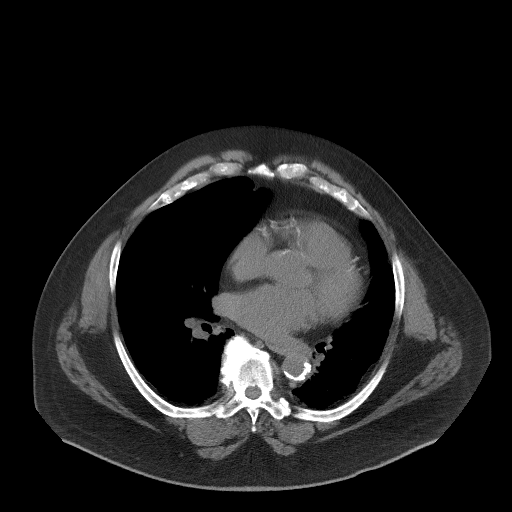

[Series 5: coronal st · coronal · 0.91mm/px · 3 of 138 slices shown]
[im 46/138  soft-tissue]
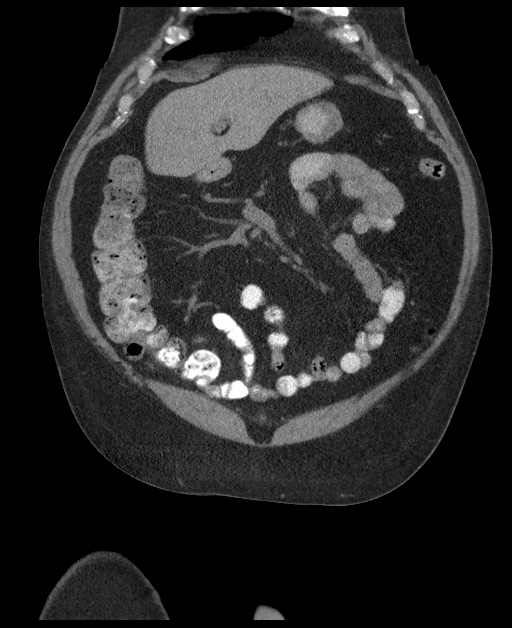
[im 61/138  soft-tissue]
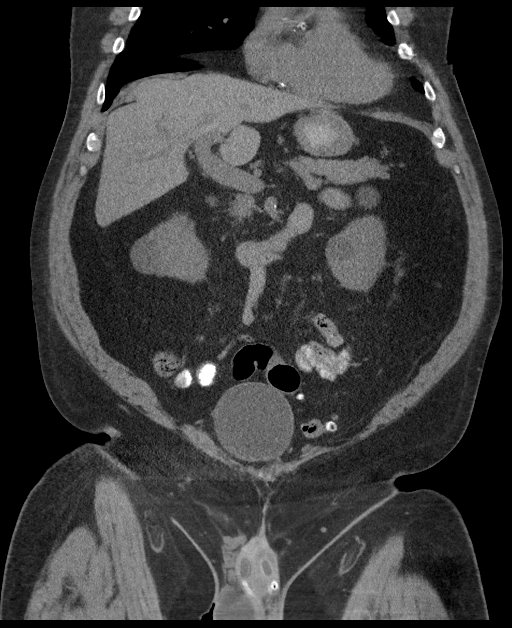
[im 77/138  soft-tissue]
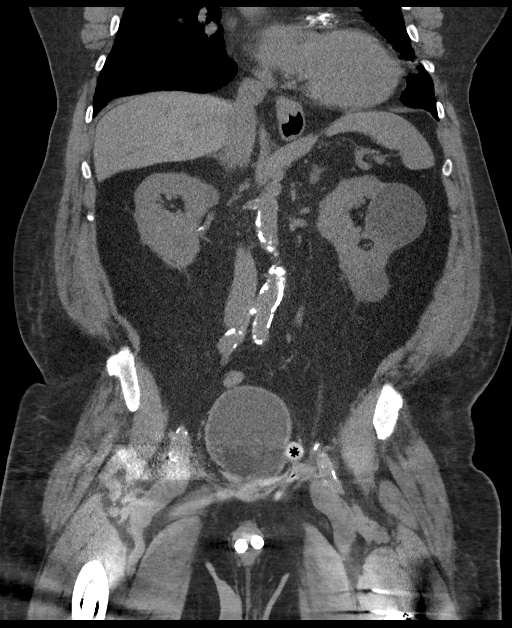

[16 of 46 positions shown; findings below may reference images not displayed]

FINDINGS: Lower chest: No acute abnormality.

Hepatobiliary: No focal liver abnormality is seen. No gallstones,
gallbladder wall thickening, or biliary dilatation.

Pancreas: Unremarkable. No pancreatic ductal dilatation or
surrounding inflammatory changes.

Spleen: Calcified splenic granulomata are noted without other focal
abnormality.

Adrenals/Urinary Tract: Adrenal glands are unremarkable. Stable
bilateral renal cysts are noted. No hydronephrosis or renal
obstruction is noted. No renal or ureteral calculi are noted.
Urinary bladder is unremarkable.

Stomach/Bowel: Stomach is within normal limits. Appendix appears
normal. No evidence of bowel wall thickening, distention, or
inflammatory changes. Diverticulosis of descending and sigmoid colon
is noted.

Vascular/Lymphatic: Aortic atherosclerosis. No enlarged abdominal or
pelvic lymph nodes.

Reproductive: Penile reservoir is noted in left-sided pelvis. Small
prostate gland is noted.

Other: No abdominal wall hernia or abnormality. No abdominopelvic
ascites.

Musculoskeletal: Status post total right hip arthroplasty. Status
post surgical fixation of proximal left femoral neck.
IMPRESSION: Aortic atherosclerosis.

Stable bilateral renal cysts.

Sigmoid diverticulosis without inflammation.

No acute abnormality seen in the abdomen or pelvis.

## 2017-12-16 ENCOUNTER — Other Ambulatory Visit
Admit: 2017-12-16 | Discharge: 2017-12-16 | Disposition: A | Discharge status: Home / Self Care | Payer: Medicare Other | Source: Ambulatory Visit | Attending: Family Medicine | Admitting: Family Medicine

## 2017-12-16 DIAGNOSIS — I5022 Chronic systolic (congestive) heart failure: Secondary | ICD-10-CM | POA: Insufficient documentation

## 2017-12-16 LAB — BASIC METABOLIC PANEL
ANION GAP: 10 (ref 5–15)
BUN: 36 mg/dL — AB (ref 6–20)
CHLORIDE: 99 mmol/L — AB (ref 101–111)
CO2: 24 mmol/L (ref 22–32)
Calcium: 8.4 mg/dL — ABNORMAL LOW (ref 8.9–10.3)
Creatinine, Ser: 1.75 mg/dL — ABNORMAL HIGH (ref 0.61–1.24)
GFR, EST AFRICAN AMERICAN: 41 mL/min — AB (ref >60)
GFR, EST NON AFRICAN AMERICAN: 35 mL/min — AB (ref >60)
Glucose, Bld: 109 mg/dL — ABNORMAL HIGH (ref 65–99)
POTASSIUM: 3.9 mmol/L (ref 3.5–5.1)
SODIUM: 133 mmol/L — AB (ref 135–145)

## 2018-01-14 ENCOUNTER — Encounter: Payer: Self-pay | Admitting: Urology

## 2018-01-14 ENCOUNTER — Ambulatory Visit (INDEPENDENT_AMBULATORY_CARE_PROVIDER_SITE_OTHER): Payer: Medicare HMO | Admitting: Urology

## 2018-01-14 VITALS — BP 125/54 | HR 74 | Ht 67.0 in | Wt 290.0 lb

## 2018-01-14 DIAGNOSIS — R339 Retention of urine, unspecified: Secondary | ICD-10-CM

## 2018-01-14 DIAGNOSIS — N529 Male erectile dysfunction, unspecified: Secondary | ICD-10-CM

## 2018-01-14 LAB — BLADDER SCAN AMB NON-IMAGING

## 2018-01-14 NOTE — Progress Notes (Signed)
Need to schedule ureteroscopy with  01/14/2018 12:42 PM   [name] [birth year] [id number]  Referring provider: Marguerita [name], [name], [name] [id number]  Chief Complaint  Patient presents with  . Incomplete Bladder Emptying    6wk    HPI: 83 year old male presents for follow-up visit.  He saw Dr. Junious [name] on 12/03/2017 complaining of a weak urinary stream and erectile dysfunction.  PVR was 178 mL by bladder scan he was started on tamsulosin.  He underwent placement of a IPP around 2010 and had a revision in 2014 at [facility].  He is unable to cycle the device.  He has noted significant improvement in his voiding pattern on tamsulosin.  He is voiding with much better stream and has less nocturia.   PMH: Past Medical History:  Diagnosis Date  . Alcoholism ([place])   . Asthma   . Bipolar affective disorder ([place])   . Chronic combined systolic and diastolic CHF (congestive heart failure) ([place])    a. 08/2016 Echo: EF 40-45%, mild AS, mild to mod MR, mildly dil LA/RA, mild-mod TR; b. 07/2017 Echo: EF 40-45%, mod LVH, Gr1 DD, mild to mod AS, mildly dil LA, nl RV fxn.  . CKD (chronic kidney disease), stage III ([place])   . COPD (chronic obstructive pulmonary disease) ([facility])    COPD  . Coronary artery disease    a. 01/2004 s/p PCI and Taxus drug-eluting stent placement to the distal RCA (3.5 x 12 mm); b. 07/2017 Lexiscan MV: no ischemia. Sm area of apicl thinning, likely attenuation. EF 33% (GI uptake noted)-->Low risk.  . Degenerative joint disease   . Essential hypertension   . Hyperlipidemia   . Hypertension   . Prostate CA [facility])    prostate ca dx 20 yrs ago  . PVD (peripheral vascular disease) ([place])   . Tobacco abuse     Surgical History: Past Surgical History:  Procedure Laterality Date  . CARDIAC CATHETERIZATION    . CORONARY ANGIOPLASTY WITH STENT PLACEMENT    . TOTAL HIP ARTHROPLASTY     right    Home Medications:  Allergies as of 01/14/2018    Reactions   Amlodipine    Constipation   Benazepril-hydrochlorothiazide    Constipation   Chlorthalidone    Hyponatremia   Other Other (See Comments)   "ANY BLOOD PRESSURE MEDICATIONS THAT I'VE TRIED" - PT. DOES NOT REMEMBER WHICH ONES      Medication List        Accurate as of 01/14/18 12:42 PM. Always use your most recent med list.          albuterol 108 (90 Base) MCG/ACT inhaler Commonly known as:  PROAIR HFA Inhale 2 puffs into the lungs every 6 (six) hours as needed for wheezing or shortness of breath. Reported on 04/21/2016   aspirin EC 81 MG tablet Take 81 mg by mouth daily.   atorvastatin 40 MG tablet Commonly known as:  LIPITOR Take 40 mg by mouth daily.   carvedilol 12.5 MG tablet Commonly known as:  COREG Take 1 tablet (12.5 mg total) by mouth 2 (two) times daily.   fluticasone-salmeterol 115-21 MCG/ACT inhaler Commonly known as:  ADVAIR HFA Inhale 2 puffs into the lungs 2 (two) times daily.   furosemide 20 MG tablet Commonly known as:  LASIX Take 2 tablets (40mg ) in the morning and 1 tablet (20mg ) in the evening   hydrALAZINE 25 MG tablet Commonly known as:  APRESOLINE Take 1 tablet (25  mg total) by mouth 2 (two) times daily.   isosorbide mononitrate 30 MG 24 hr tablet Commonly known as:  IMDUR Take 1 tablet (30 mg total) by mouth daily.   ketoconazole 2 % shampoo Commonly known as:  NIZORAL Apply 1 application topically 3 (three) times a week.   levothyroxine 50 MCG tablet Commonly known as:  SYNTHROID, LEVOTHROID Take 50 mcg by mouth daily before breakfast.   NITROSTAT 0.4 MG SL tablet Generic drug:  nitroGLYCERIN Take 0.4 mg by mouth every 5 (five) minutes x 3 doses as needed for chest pain. As needed for chest pain   SPIRIVA HANDIHALER 18 MCG inhalation capsule Generic drug:  tiotropium Place 18 mcg into inhaler and inhale daily.   tamsulosin 0.4 MG Caps capsule Commonly known as:  FLOMAX Take 1 capsule (0.4 mg total) by mouth daily.     vitamin B-6 25 MG tablet Commonly known as:  pyridOXINE Take 25 mg by mouth daily.       Allergies:  Allergies  Allergen Reactions  . Amlodipine     Constipation   . Benazepril-Hydrochlorothiazide     Constipation   . Chlorthalidone     Hyponatremia  . Other Other (See Comments)    "ANY BLOOD PRESSURE MEDICATIONS THAT I'VE TRIED" - PT. DOES NOT REMEMBER WHICH ONES    Family History: Family History  Problem Relation Age of Onset  . Hypertension Mother   . Hyperlipidemia Mother   . Heart attack Mother   . Hypertension Father   . Hyperlipidemia Father   . Heart attack Father   . Prostate cancer Neg Hx   . Bladder Cancer Neg Hx   . Kidney cancer Neg Hx     Social History:  reports that he has quit smoking. His smoking use included cigarettes. He has a 50.00 pack-year smoking history. he has never used smokeless tobacco. He reports that he drinks alcohol. He reports that he does not use drugs.  ROS: UROLOGY Frequent Urination?: No Hard to postpone urination?: No Burning/pain with urination?: No Get up at night to urinate?: Yes Leakage of urine?: No Urine stream starts and stops?: No Trouble starting stream?: No Do you have to strain to urinate?: No Blood in urine?: No Urinary tract infection?: No Sexually transmitted disease?: No Injury to kidneys or bladder?: No Painful intercourse?: No Weak stream?: Yes Erection problems?: Yes Penile pain?: No  Gastrointestinal Nausea?: No Vomiting?: No Indigestion/heartburn?: Yes Diarrhea?: No Constipation?: No  Constitutional Fever: No Night sweats?: Yes Weight loss?: No Fatigue?: Yes  Skin Skin rash/lesions?: No Itching?: No  Eyes Blurred vision?: No Double vision?: No  Ears/Nose/Throat Sore throat?: No Sinus problems?: No  Hematologic/Lymphatic Swollen glands?: No Easy bruising?: No  Cardiovascular Leg swelling?: No Chest pain?: Yes  Respiratory Cough?: Yes Shortness of breath?:  Yes  Endocrine Excessive thirst?: Yes  Musculoskeletal Back pain?: No Joint pain?: No  Neurological Headaches?: No Dizziness?: No  Psychologic Depression?: No Anxiety?: No  Physical Exam: BP (!) 125/54   Pulse 74   Ht 5\' 7"  (1.702 m)   Wt 290 lb (131.5 kg)   BMI 45.42 kg/m   Constitutional:  Alert and oriented, No acute distress. HEENT: [place] AT, moist mucus membranes.  Trachea midline, no masses. Cardiovascular: No clubbing, cyanosis, or edema. Respiratory: Normal respiratory effort, no increased work of breathing. GI: Abdomen is soft, nontender, nondistended, no abdominal masses GU: No CVA tenderness.  Cycling the device there is partial inflation which does not remain. Skin: No rashes,  bruises or suspicious lesions. Lymph: No cervical or inguinal adenopathy. Neurologic: Grossly intact, no focal deficits, moving all 4 extremities. Psychiatric: Normal mood and affect.  Laboratory Data: Lab Results  Component Value Date   WBC 6.7 08/05/2017   HGB 10.5 (L) 08/05/2017   HCT 30.7 (L) 08/05/2017   MCV 86.6 08/05/2017   PLT 189 08/05/2017    Lab Results  Component Value Date   CREATININE 1.75 (H) 12/16/2017     Lab Results  Component Value Date   HGBA1C  06/13/2009    5.8 (NOTE) The ADA recommends the following therapeutic goal for glycemic control related to Hgb A1c measurement: Goal of therapy: <6.5 Hgb A1c  Reference: American Diabetes Association: Clinical Practice Recommendations 2010, Diabetes Care, 2010, 33: (Suppl  1).      Assessment & Plan:   1. Incomplete bladder emptying Significant improvement in his voiding pattern on tamsulosin.  Repeat PVR by bladder scan today improved at 83 mL.  - BLADDER SCAN AMB NON-IMAGING  2. Erectile dysfunction, unspecified erectile dysfunction type Penile prosthesis is not cycling.  He has had 1 prior revision.  He does have significant medical comorbidities and may not be a candidate for further revision.  He did  not want to go back to [facility] and requested a referral to [facility] for further evaluation/discussion.  - Ambulatory referral to Urology   Abbie Sons, [place] [address], [place], [place] [address] [phone]

## 2018-01-19 ENCOUNTER — Emergency Department
Admission: EM | Admit: 2018-01-19 | Discharge: 2018-01-19 | Disposition: A | Discharge status: Home / Self Care | Payer: Medicare HMO | Attending: Emergency Medicine | Admitting: Emergency Medicine

## 2018-01-19 ENCOUNTER — Encounter: Payer: Self-pay | Admitting: Emergency Medicine

## 2018-01-19 ENCOUNTER — Emergency Department: Payer: Medicare HMO

## 2018-01-19 ENCOUNTER — Other Ambulatory Visit: Payer: Self-pay

## 2018-01-19 DIAGNOSIS — Z96641 Presence of right artificial hip joint: Secondary | ICD-10-CM | POA: Diagnosis not present

## 2018-01-19 DIAGNOSIS — J449 Chronic obstructive pulmonary disease, unspecified: Secondary | ICD-10-CM | POA: Diagnosis not present

## 2018-01-19 DIAGNOSIS — Z955 Presence of coronary angioplasty implant and graft: Secondary | ICD-10-CM | POA: Diagnosis not present

## 2018-01-19 DIAGNOSIS — J45909 Unspecified asthma, uncomplicated: Secondary | ICD-10-CM | POA: Diagnosis not present

## 2018-01-19 DIAGNOSIS — Z7982 Long term (current) use of aspirin: Secondary | ICD-10-CM | POA: Insufficient documentation

## 2018-01-19 DIAGNOSIS — I10 Essential (primary) hypertension: Secondary | ICD-10-CM | POA: Insufficient documentation

## 2018-01-19 DIAGNOSIS — I2510 Atherosclerotic heart disease of native coronary artery without angina pectoris: Secondary | ICD-10-CM | POA: Insufficient documentation

## 2018-01-19 DIAGNOSIS — Z8546 Personal history of malignant neoplasm of prostate: Secondary | ICD-10-CM | POA: Insufficient documentation

## 2018-01-19 DIAGNOSIS — Z87891 Personal history of nicotine dependence: Secondary | ICD-10-CM | POA: Insufficient documentation

## 2018-01-19 DIAGNOSIS — R51 Headache: Secondary | ICD-10-CM | POA: Diagnosis not present

## 2018-01-19 DIAGNOSIS — Z79899 Other long term (current) drug therapy: Secondary | ICD-10-CM | POA: Insufficient documentation

## 2018-01-19 LAB — BASIC METABOLIC PANEL
Anion gap: 10 (ref 5–15)
BUN: 44 mg/dL — ABNORMAL HIGH (ref 6–20)
CALCIUM: 8.7 mg/dL — AB (ref 8.9–10.3)
CO2: 24 mmol/L (ref 22–32)
CREATININE: 2.05 mg/dL — AB (ref 0.61–1.24)
Chloride: 97 mmol/L — ABNORMAL LOW (ref 101–111)
GFR calc Af Amer: 34 mL/min — ABNORMAL LOW (ref >60)
GFR calc non Af Amer: 29 mL/min — ABNORMAL LOW (ref >60)
Glucose, Bld: 109 mg/dL — ABNORMAL HIGH (ref 65–99)
Potassium: 4.5 mmol/L (ref 3.5–5.1)
SODIUM: 131 mmol/L — AB (ref 135–145)

## 2018-01-19 LAB — CBC
HCT: 33.9 % — ABNORMAL LOW (ref 40.0–52.0)
Hemoglobin: 11.5 g/dL — ABNORMAL LOW (ref 13.0–18.0)
MCH: 28.8 pg (ref 26.0–34.0)
MCHC: 33.9 g/dL (ref 32.0–36.0)
MCV: 84.9 fL (ref 80.0–100.0)
PLATELETS: 257 10*3/uL (ref 150–440)
RBC: 3.99 MIL/uL — ABNORMAL LOW (ref 4.40–5.90)
RDW: 15.9 % — AB (ref 11.5–14.5)
WBC: 6.3 10*3/uL (ref 3.8–10.6)

## 2018-01-19 MED ORDER — HYDRALAZINE HCL 50 MG PO TABS
25.0000 mg | ORAL_TABLET | Freq: Once | ORAL | Status: AC
  Administered 2018-01-19: 25 mg via ORAL
  Filled 2018-01-19: qty 1

## 2018-01-19 NOTE — ED Notes (Signed)
Patient transported to CT 

## 2018-01-19 NOTE — ED Notes (Signed)
No protocols at this time per Dr. Burlene Arnt.

## 2018-01-19 NOTE — ED Provider Notes (Signed)
[facility] Emergency Department Provider Note  ____________________________________________  Time seen: Approximately 10:36 AM  I have reviewed the triage vital signs and the nursing notes.   HISTORY  Chief Complaint Hypertension    HPI [name] is a 83 y.o. male that presents to the emergency department for evaluation of high blood pressure.  Patient checked his blood pressure yesterday morning before his medication and it was 180s/70s.  Blood pressure this morning was 192/49 after medication. He has had a headache in the front on and off since yesterday.  He does not currently have a headache but had one while in the waiting room.  He ate a lot of pickles and chips yesterday even though he is supposed to be following a low-sodium diet.  He takes carvedilol, hydralazine, and furosemide at 6am everyday. He took his medication this morning. He had some central CP 2 days ago, which he has had almost daily for years. He talked to his cardiologist about a couple of weeks ago. He has not had any CP today or yesterday.  He is asymptomatic currently.   Past Medical History:  Diagnosis Date  . Alcoholism ([place])   . Asthma   . Bipolar affective disorder ([place])   . Chronic combined systolic and diastolic CHF (congestive heart failure) ([place])    a. 08/2016 Echo: EF 40-45%, mild AS, mild to mod MR, mildly dil LA/RA, mild-mod TR; b. 07/2017 Echo: EF 40-45%, mod LVH, Gr1 DD, mild to mod AS, mildly dil LA, nl RV fxn.  . CKD (chronic kidney disease), stage III ([place])   . COPD (chronic obstructive pulmonary disease) (H[facility])    COPD  . Coronary artery disease    a. 01/2004 s/p PCI and Taxus drug-eluting stent placement to the distal RCA (3.5 x 12 mm); b. 07/2017 Lexiscan MV: no ischemia. Sm area of apicl thinning, likely attenuation. EF 33% (GI uptake noted)-->Low risk.  . Degenerative joint disease   . Essential hypertension   . Hyperlipidemia   . Hypertension   . Prostate CA  [facility])    prostate ca dx 20 yrs ago  . PVD (peripheral vascular disease) ([place])   . Tobacco abuse     Patient Active Problem List   Diagnosis Date Noted  . Elevated troponin I level 07/31/2017  . Hyperkalemia 05/28/2017  . Syncope 05/27/2017  . Meningioma ([place]) 01/05/2017  . GI bleed 12/01/2016  . Alcohol abuse 10/30/2016  . Chronic systolic heart failure ([place]) 10/04/2016  . COPD (chronic obstructive pulmonary disease) with chronic bronchitis ([place]) 10/04/2016  . Obstructive sleep apnea 10/04/2016  . Hyponatremia 08/31/2016  . UTI (lower urinary tract infection) 08/31/2016  . Exertional dyspnea 02/11/2016  . Hyperlipidemia 12/03/2015  . Essential hypertension   . Coronary artery disease 01/19/2004    Past Surgical History:  Procedure Laterality Date  . CARDIAC CATHETERIZATION    . CORONARY ANGIOPLASTY WITH STENT PLACEMENT    . TOTAL HIP ARTHROPLASTY     right    Prior to Admission medications   Medication Sig Start Date End Date Taking? Authorizing Provider  albuterol (PROAIR HFA) 108 (90 Base) MCG/ACT inhaler Inhale 2 puffs into the lungs every 6 (six) hours as needed for wheezing or shortness of breath. Reported on 04/21/2016 05/31/17 10/05/18  Loletha Grayer, MD  aspirin EC 81 MG tablet Take 81 mg by mouth daily.    [provider]  atorvastatin (LIPITOR) 40 MG tablet Take 40 mg by mouth daily.    [provider]  carvedilol (COREG) 12.5 MG tablet Take 1 tablet (12.5 mg total) by mouth 2 (two) times daily. 09/04/17 12/07/17  Theora Gianotti, NP  fluticasone-salmeterol (ADVAIR HFA) [phone] MCG/ACT inhaler Inhale 2 puffs into the lungs 2 (two) times daily.    [provider]  furosemide (LASIX) 20 MG tablet Take 2 tablets (40mg ) in the morning and 1 tablet (20mg ) in the evening 12/07/17   [name], MD  hydrALAZINE (APRESOLINE) 25 MG tablet Take 1 tablet (25 mg total) by mouth 2 (two) times daily. 12/07/17 03/07/18  [name], MD   isosorbide mononitrate (IMDUR) 30 MG 24 hr tablet Take 1 tablet (30 mg total) by mouth daily. 12/07/17   [name], MD  ketoconazole (NIZORAL) 2 % shampoo Apply 1 application topically 3 (three) times a week.  11/21/16   [provider]  levothyroxine (SYNTHROID, LEVOTHROID) 50 MCG tablet Take 50 mcg by mouth daily before breakfast.    [provider]  nitroGLYCERIN (NITROSTAT) 0.4 MG SL tablet Take 0.4 mg by mouth every 5 (five) minutes x 3 doses as needed for chest pain. As needed for chest pain 12/06/15   [provider]  tamsulosin (FLOMAX) 0.4 MG CAPS capsule Take 1 capsule (0.4 mg total) by mouth daily. 12/05/17   Festus Aloe, MD  tiotropium (SPIRIVA HANDIHALER) 18 MCG inhalation capsule Place 18 mcg into inhaler and inhale daily.    [provider]  vitamin B-6 (PYRIDOXINE) 25 MG tablet Take 25 mg by mouth daily.    [provider]    Allergies Amlodipine; Benazepril-hydrochlorothiazide; Chlorthalidone; and Other  Family History  Problem Relation Age of Onset  . Hypertension Mother   . Hyperlipidemia Mother   . Heart attack Mother   . Hypertension Father   . Hyperlipidemia Father   . Heart attack Father   . Prostate cancer Neg Hx   . Bladder Cancer Neg Hx   . Kidney cancer Neg Hx     Social History Social History   Tobacco Use  . Smoking status: Former Smoker    Packs/day: 1.00    Years: 50.00    Pack years: 50.00    Types: Cigarettes  . Smokeless tobacco: Never Used  Substance Use Topics  . Alcohol use: Yes    Comment: social   . Drug use: No     Review of Systems  Gastrointestinal: No abdominal pain.  No nausea, no vomiting.  Musculoskeletal: Negative for musculoskeletal pain. Skin: Negative for rash, abrasions, lacerations, ecchymosis. Neurological: Negative for numbness or tingling   ____________________________________________   PHYSICAL EXAM:  VITAL SIGNS: ED Triage Vitals  Enc Vitals  Group     BP 01/19/18 0846 (!) 187/78     Pulse Rate 01/19/18 0846 (!) 59     Resp 01/19/18 0846 20     Temp 01/19/18 0846 98 F (36.7 C)     Temp Source 01/19/18 0846 Oral     SpO2 01/19/18 0846 97 %     Weight 01/19/18 0847 293 lb (132.9 kg)     Height 01/19/18 0847 5\' 7"  (1.702 m)     Head Circumference --      Peak Flow --      Pain Score 01/19/18 0846 0     Pain Loc --      Pain Edu? --      Excl. in [place]? --      Constitutional: Alert and oriented. Well appearing and in no acute  distress. Eyes: Conjunctivae are normal.  Right pupil reactive to light.  Patient blind in left eye.  EOMI. Head: Atraumatic. ENT:      Ears:      Nose: No congestion/rhinnorhea.      Mouth/Throat: Mucous membranes are moist.  Neck: No stridor.  Cardiovascular: Normal rate, regular rhythm.  Good peripheral circulation. Respiratory: Normal respiratory effort without tachypnea or retractions. Lungs CTAB. Good air entry to the bases with no decreased or absent breath sounds. Gastrointestinal: Bowel sounds 4 quadrants. Soft and nontender to palpation. No guarding or rigidity. No palpable masses. No distention. Musculoskeletal: Full range of motion to all extremities. No gross deformities appreciated. Neurologic:  Normal speech and language. No gross focal neurologic deficits are appreciated.  Skin:  Skin is warm, dry and intact. No rash noted.    ____________________________________________   LABS (all labs ordered are listed, but only abnormal results are displayed)  Labs Reviewed  CBC - Abnormal; Notable for the following components:      Result Value   RBC 3.99 (*)    Hemoglobin 11.5 (*)    HCT 33.9 (*)    RDW 15.9 (*)    All other components within normal limits  BASIC METABOLIC PANEL - Abnormal; Notable for the following components:   Sodium 131 (*)    Chloride 97 (*)    Glucose, Bld 109 (*)    BUN 44 (*)    Creatinine, Ser 2.05 (*)    Calcium 8.7 (*)    GFR calc non Af Amer 29  (*)    GFR calc Af Amer 34 (*)    All other components within normal limits   ____________________________________________  EKG   ____________________________________________  RADIOLOGY  Ct Head Wo Contrast  Result Date: 01/19/2018 CLINICAL DATA:  Pt states headaches recently; hx of high blood pressure. Hx of prostate ca. Denies any injuries. EXAM: CT HEAD WITHOUT CONTRAST TECHNIQUE: Contiguous axial images were obtained from the base of the skull through the vertex without intravenous contrast. COMPARISON:  MR 12/27/2016, CT 12/29/2011 FINDINGS: Brain: 3.3 cm left tentorial extra-axial mass with peripheral coarse calcifications, without significant change in size. This results in mild mass effect upon the left cerebral hemisphere and lateral ventricle without regional edema or hydrocephalus. Negative for acute intracranial hemorrhage, midline shift. Acute infarct may be inapparent on noncontrast CT. Mild diffuse parenchymal atrophy. Vascular: Atherosclerotic and physiologic intracranial calcifications. Skull: Normal. Negative for fracture or focal lesion. Sinuses/Orbits: No acute finding. Other: None. IMPRESSION: 1. Negative for bleed or other acute intracranial process. 2. Little change in left extra-axial tentorial mass, previously characterized on MRI as meningioma. Electronically Signed   By: Lucrezia Europe M.D.   On: 01/19/2018 10:50    ____________________________________________    PROCEDURES  Procedure(s) performed:    Procedures    Medications  hydrALAZINE (APRESOLINE) tablet 25 mg (25 mg Oral Given 01/19/18 1153)     ____________________________________________   INITIAL IMPRESSION / ASSESSMENT AND PLAN / ED COURSE  Pertinent labs & imaging results that were available during my care of the patient were reviewed by me and considered in my medical decision making (see chart for details).  Review of the [place] CSRS was performed in accordance of the [place] prior to dispensing  any controlled drugs.   Patient presented to the emergency department for evaluation of hypertension.  Vital signs and exam are reassuring.  Patient is currently asymptomatic.  CT head negative for acute abnormalities.  CT findings were discussed with patient.  Patient has stage III kidney disease.  BUN, Creatinine, and GFR are consistent with previous results.  He was given an extra dose of hydralazine while in ED. Patient is to follow up with PCP in 2 days.  Dr. Burlene Arnt was consulted and agrees with plan of care.   Patient is given ED precautions to return to the ED for any worsening or new symptoms.     ____________________________________________  FINAL CLINICAL IMPRESSION(S) / ED DIAGNOSES  Final diagnoses:  Hypertension, unspecified type      NEW MEDICATIONS STARTED DURING THIS VISIT:  ED Discharge Orders    None          This chart was dictated using voice recognition software/Dragon. Despite best efforts to proofread, errors can occur which can change the meaning. Any change was purely unintentional.    Laban Emperor, PA-C 01/19/18 1854    Harvest Dark, MD 01/20/18 1426

## 2018-01-19 NOTE — ED Notes (Signed)
Patient reports he is here because his b/p is higher than normal. Took OTC b/p meds today. Denies any new chest pain or SOB

## 2018-01-19 NOTE — ED Triage Notes (Signed)
Patient to ER for c/o HTN since last night. Has h/o the same, but states BP last night was 594 systolic. States he did have headache, but headache has since resolved after taking BP medication.

## 2018-03-08 ENCOUNTER — Ambulatory Visit: Payer: Medicare Other | Admitting: Nurse Practitioner

## 2018-03-25 ENCOUNTER — Ambulatory Visit: Payer: Medicare Other | Admitting: Nurse Practitioner

## 2018-04-24 ENCOUNTER — Ambulatory Visit (INDEPENDENT_AMBULATORY_CARE_PROVIDER_SITE_OTHER): Payer: Medicare HMO | Admitting: Nurse Practitioner

## 2018-04-24 ENCOUNTER — Encounter: Payer: Self-pay | Admitting: Nurse Practitioner

## 2018-04-24 VITALS — BP 100/50 | HR 52 | Ht 67.0 in | Wt 299.8 lb

## 2018-04-24 DIAGNOSIS — I25119 Atherosclerotic heart disease of native coronary artery with unspecified angina pectoris: Secondary | ICD-10-CM

## 2018-04-24 DIAGNOSIS — I5042 Chronic combined systolic (congestive) and diastolic (congestive) heart failure: Secondary | ICD-10-CM

## 2018-04-24 DIAGNOSIS — I10 Essential (primary) hypertension: Secondary | ICD-10-CM

## 2018-04-24 DIAGNOSIS — N183 Chronic kidney disease, stage 3 unspecified: Secondary | ICD-10-CM

## 2018-04-24 DIAGNOSIS — E782 Mixed hyperlipidemia: Secondary | ICD-10-CM | POA: Diagnosis not present

## 2018-04-24 MED ORDER — ISOSORBIDE MONONITRATE ER 60 MG PO TB24: 60.0000 mg | ORAL_TABLET | Freq: Every day | ORAL | 6 refills | Status: DC

## 2018-04-24 MED ORDER — HYDRALAZINE HCL 25 MG PO TABS: 25.0000 mg | ORAL_TABLET | Freq: Two times a day (BID) | ORAL | 6 refills | Status: DC

## 2018-04-24 NOTE — Patient Instructions (Addendum)
Medication Instructions: - Your physician has recommended you make the following change in your medication:   1) STOP norvasc (amlodipine) 2) START hydralazine 25 mg- take 1 tablet (25 mg) by mouth twice daily 3) INCREASE imdur (isosorbide) to 60 mg- take 1 tablet (60 mg) by mouth once daily  Labwork: - none ordered  Procedures/Testing: - none ordered  Follow-Up: - Your physician recommends that you schedule a follow-up appointment in: 1 month with Dr. Fletcher Anon Ignacia Bayley, NP   Any Additional Special Instructions Will Be Listed Below (If Applicable). - call the office back in a week if you are having elevated blood pressures    If you need a refill on your cardiac medications before your next appointment, please call your pharmacy.

## 2018-04-24 NOTE — Progress Notes (Signed)
Office Visit    Patient Name: [name] Date of Encounter: 04/24/2018  Primary Care Provider:  Marguerita Merles, MD Primary Cardiologist:  Kathlyn Sacramento, MD  Chief Complaint    83 year old male with history of CAD, chronic combined heart failure, hypertension, hyperlipidemia, peripheral vascular disease, COPD, bipolar disorder, and obesity, who presents for follow-up.  Past Medical History    Past Medical History:  Diagnosis Date  . Alcoholism ([place])   . Asthma   . Bipolar affective disorder ([place])   . Chronic combined systolic and diastolic CHF (congestive heart failure) ([place])    a. 08/2016 Echo: EF 40-45%, mild AS, mild to mod MR, mildly dil LA/RA, mild-mod TR; b. 07/2017 Echo: EF 40-45%, mod LVH, Gr1 DD, mild to mod AS, mildly dil LA, nl RV fxn.  . CKD (chronic kidney disease), stage III ([place])   . COPD (chronic obstructive pulmonary disease) ([facility])    COPD  . Coronary artery disease    a. 01/2004 s/p PCI and Taxus drug-eluting stent placement to the distal RCA (3.5 x 12 mm); b. 07/2017 Lexiscan MV: no ischemia. Sm area of apicl thinning, likely attenuation. EF 33% (GI uptake noted)-->Low risk.  . Degenerative joint disease   . Essential hypertension   . Hyperlipidemia   . Hypertension   . Prostate CA [facility])    prostate ca dx 20 yrs ago  . PVD (peripheral vascular disease) ([place])   . Tobacco abuse    Past Surgical History:  Procedure Laterality Date  . CARDIAC CATHETERIZATION    . CORONARY ANGIOPLASTY WITH STENT PLACEMENT    . TOTAL HIP ARTHROPLASTY     right    Allergies  Allergies  Allergen Reactions  . Amlodipine     Constipation   . Benazepril-Hydrochlorothiazide     Constipation   . Chlorthalidone Other (See Comments)    Hyponatremia Hyponatremia  . Lisinopril   . Other Other (See Comments)    "ANY BLOOD PRESSURE MEDICATIONS THAT I'VE TRIED" - PT. DOES NOT REMEMBER WHICH ONES "ANY BLOOD PRESSURE MEDICATIONS THAT I'VE TRIED"- PT. DOES NOT REMEMBER WHICH  ONES- CONSTIPATION    History of Present Illness    83 year old male with the above complex past medical history including CAD status post drug-eluting stent placement to the distal RCA in February 2005.  Other history includes hypertension, hyperlipidemia, obesity, chronic combined heart failure, peripheral vascular disease, bipolar disorder, and stage III chronic kidney disease.  Last August, he was admitted to [facility] with chest pain and mild troponin elevation.  Echo showed stable LV dysfunction with an EF of 40 to 45%.  Stress testing was nonischemic and he was medically managed.  He was last seen in clinic in December, at which time he was up 10 pounds and felt to be mildly volume overloaded.  Lasix dose was increased.  Dose was subsequently decreased, potentially by nephrology or primary care, but he says that from a volume standpoint he has done well.  He follows his blood pressure diligently at home on a daily basis and noted elevation in February, resulting in titration of carvedilol to 25 mg twice daily.  In late April, pressures remained elevated in the 140s to 150s and amlodipine 5 mg daily was added.  Of note, at one point he was on hydralazine and this was discontinued however patient is not sure when it was discontinued.  After starting amlodipine, he began to note fatigue and low energy.  He also says that within  2 to 3 hours of taking amlodipine, he has mild retrosternal chest heaviness associated with nausea and bloating, which lasts about 20 minutes and usually resolves spontaneously.  This has been occurring almost daily.  He is not sure if is better with Gas-X.  He has sometimes taken nitroglycerin for this discomfort with relief.  He does not have any exertional symptoms, though notes that activity is fairly limited at home, especially in the setting of low energy.  He would like to stop amlodipine.  From a volume standpoint, though his weight is up even further since his last  visit in December, volume has been stable.  He denies edema, change in abdominal girth, PND, orthopnea, dizziness, syncope, or early satiety.  Home Medications    Prior to Admission medications   Medication Sig Start Date End Date Taking? Authorizing Provider  albuterol (PROAIR HFA) 108 (90 Base) MCG/ACT inhaler Inhale 2 puffs into the lungs every 6 (six) hours as needed for wheezing or shortness of breath. Reported on 04/21/2016 05/31/17 10/05/18 Yes Wieting, Richard, MD  amLODipine (NORVASC) 5 MG tablet Take 5 mg by mouth daily.   Yes [provider]  aspirin EC 81 MG tablet Take 81 mg by mouth daily.   Yes [provider]  carvedilol (COREG) 25 MG tablet Take 25 mg by mouth 2 (two) times daily with a meal.   Yes [provider]  fluticasone-salmeterol (ADVAIR HFA) 115-21 MCG/ACT inhaler Inhale 2 puffs into the lungs 2 (two) times daily.   Yes [provider]  furosemide (LASIX) 20 MG tablet Take 20 mg by mouth 2 (two) times daily.   Yes [provider]  isosorbide mononitrate (IMDUR) 30 MG 24 hr tablet Take 1 tablet (30 mg total) by mouth daily. 12/07/17  Yes [name], MD  levothyroxine (SYNTHROID, LEVOTHROID) 50 MCG tablet Take 50 mcg by mouth daily before breakfast.   Yes [provider]  magnesium oxide (MAG-OX) 400 MG tablet Take 400 mg by mouth 2 (two) times daily.   Yes [provider]  nitroGLYCERIN (NITROSTAT) 0.4 MG SL tablet Take 0.4 mg by mouth every 5 (five) minutes x 3 doses as needed for chest pain. As needed for chest pain 12/06/15  Yes [provider]  tiotropium (SPIRIVA HANDIHALER) 18 MCG inhalation capsule Place 18 mcg into inhaler and inhale daily.   Yes [provider]    Review of Systems    Daily episodes of chest discomfort lasting about 20 minutes and often resolving spontaneously.  Fatigue and low energy over the past 3 to 4 weeks and starting amlodipine.  All other systems  reviewed and are otherwise negative except as noted above.  Physical Exam    VS:  BP (!) 100/50 (BP Location: Left Arm, Patient Position: Sitting, Cuff Size: Large)   Pulse (!) 52   Ht 5\' 7"  (1.702 m)   Wt 299 lb 12 oz (136 kg)   BMI 46.95 kg/m  , BMI Body mass index is 46.95 kg/m. GEN: Obese, in no acute distress.  HEENT: normal.  Neck: Supple, obese, difficult to gauge JVP, no carotid bruits, or masses. Cardiac: RRR, distant, no murmurs, rubs, or gallops. No clubbing, cyanosis, edema.  Radials/DP/PT 1+ and equal bilaterally.  Respiratory:  Respirations regular and unlabored, clear to auscultation bilaterally. GI: Obese, soft, nontender, nondistended, BS + x 4. MS: no deformity or atrophy. Skin: warm and dry, no rash. Neuro:  Strength and sensation are intact. Psych: Normal affect.  Accessory  Clinical Findings    ECG -sinus bradycardia, 52, LVH with nonspecific T changes.  No acute changes.  Assessment & Plan    1.  Chest pain/CAD: Patient with prior drug-eluting stent placement to the distal RCA in February 2005 with subsequent  nonischemic stress test in August 2018.  He says he was placed on amlodipine about 3 to 4 weeks ago secondary to elevated blood pressures and ever since, he has had low energy and brief episodes of retrosternal chest discomfort, lasting 20 minutes, and often resolving spontaneously.  He feels as though the symptoms are related to his amlodipine and would like to discontinue it.  He has not had any exertional symptoms.  Symptoms are sometimes associated with gassiness and nausea.  ECG is nonacute today.  I am going to discontinue amlodipine, add back hydralazine 25 mg twice daily, and increase isosorbide mononitrate to 60 mg daily.  If he continues to have intermittent chest discomfort despite these changes, we could consider repeat ischemic evaluation.  With his history of stage III chronic kidney disease and creatinines that trend in the high ones to twos, he  is not an ideal angiography candidate.  2.  Essential hypertension: Blood pressure much improved since starting amlodipine however, he feels that this is resulted in fatigue.  His blood pressures at home have been running in the 120s.  He is 100/50 today.  He would like to come off of amlodipine and as result, I will discontinue this.  I will add back hydralazine 25 mg twice daily, which he was on previously.  And also titrating isosorbide.  I suspect he will need more hydralazine, likely 3 times daily, and have asked him to call us in a week with his blood pressure readings.  He otherwise remains on carvedilol 25 twice daily.  3.  Ischemic cardiomyopathy/chronic combined systolic and diastolic congestive heart failure: His weight is up over the past 6 months however, he is euvolemic on exam.  He is currently on Lasix 20 twice daily.  No ace/ARB secondary to CKD 3  4.  Stage III chronic kidney disease: Followed by nephrology.  Creatinine stable in February.  5.  Hyperlipidemia: Continue statin therapy.  6.  Peripheral vascular disease: No claudication.  Continue aspirin and statin.  7.  Disposition: Patient to call us in a week with blood pressure findings.  We may need to increase hydralazine to 25 milligrams 3 times daily at that point.  I will arrange for follow-up in 1 month.   Murray Hodgkins, NP 04/24/2018, 10:05 AM

## 2018-05-15 ENCOUNTER — Encounter (INDEPENDENT_AMBULATORY_CARE_PROVIDER_SITE_OTHER): Payer: Self-pay | Admitting: Vascular Surgery

## 2018-05-15 ENCOUNTER — Ambulatory Visit (INDEPENDENT_AMBULATORY_CARE_PROVIDER_SITE_OTHER): Payer: Medicare HMO | Admitting: Vascular Surgery

## 2018-05-15 VITALS — BP 121/67 | HR 61 | Resp 16 | Ht 67.0 in | Wt 300.8 lb

## 2018-05-15 DIAGNOSIS — E7849 Other hyperlipidemia: Secondary | ICD-10-CM | POA: Diagnosis not present

## 2018-05-15 DIAGNOSIS — M79604 Pain in right leg: Secondary | ICD-10-CM | POA: Diagnosis not present

## 2018-05-15 DIAGNOSIS — M79605 Pain in left leg: Secondary | ICD-10-CM | POA: Diagnosis not present

## 2018-05-15 DIAGNOSIS — I10 Essential (primary) hypertension: Secondary | ICD-10-CM

## 2018-05-15 NOTE — Progress Notes (Signed)
Subjective:    Patient ID: [name], male    DOB: 1935/05/17, 83 y.o.   MRN: [id number] Chief Complaint  Patient presents with  . New Patient (Initial Visit)    ref [name] for bil cramping   Patient last seen in 2013.  The patient presents today from the [facility] with a chief complaint of progressively worsening bilateral thigh and calf cramps.  The patient notes pain located to the bilateral thighs and calves with ambulation and walking for long distances.  The patient notes that the discomfort has progressively worsened.  The patient notes a shortening in his claudication distance.  The patient denies any rest pain or ulceration to the bilateral lower extremity.  The patient does have an orthopedic history including a "left thigh rod" and right hip replacement.  The patient is also complaining of pain in the bilateral knee joints.  The patient denies any fever, nausea vomiting.  Review of Systems  Constitutional: Negative.   HENT: Negative.   Eyes: Negative.   Respiratory: Negative.   Cardiovascular:       Thigh and calf claudication  Gastrointestinal: Negative.   Endocrine: Negative.   Genitourinary: Negative.   Musculoskeletal: Negative.   Skin: Negative.   Allergic/Immunologic: Negative.   Neurological: Negative.   Hematological: Negative.   Psychiatric/Behavioral: Negative.       Objective:   Physical Exam  Constitutional: He is oriented to person, place, and time. He appears well-developed and well-nourished. No distress.  HENT:  Head: Normocephalic and atraumatic.  Right Ear: External ear normal.  Left Ear: External ear normal.  Eyes: Pupils are equal, round, and reactive to light. Conjunctivae are normal.  Neck: Normal range of motion.  Cardiovascular: Normal rate, regular rhythm, normal heart sounds and intact distal pulses.  Pulses:      Radial pulses are 2+ on the right side, and 2+ on the left side.  Hard to palpate pedal pulses however  the bilateral feet are warm  Pulmonary/Chest: Effort normal and breath sounds normal.  Musculoskeletal: Normal range of motion. He exhibits no edema.  Neurological: He is alert and oriented to person, place, and time.  Skin: Skin is warm and dry. He is not diaphoretic.  Psychiatric: He has a normal mood and affect. His behavior is normal. Judgment and thought content normal.  Vitals reviewed.  BP 121/67 (BP Location: Right Arm)   Pulse 61   Resp 16   Ht 5\' 7"  (1.702 m)   Wt (!) 300 lb 12.8 oz (136.4 kg)   BMI 47.11 kg/m   Past Medical History:  Diagnosis Date  . Alcoholism ([place])   . Asthma   . Bipolar affective disorder ([place])   . Chronic combined systolic and diastolic CHF (congestive heart failure) ([place])    a. 08/2016 Echo: EF 40-45%, mild AS, mild to mod MR, mildly dil LA/RA, mild-mod TR; b. 07/2017 Echo: EF 40-45%, mod LVH, Gr1 DD, mild to mod AS, mildly dil LA, nl RV fxn.  . CKD (chronic kidney disease), stage III ([place])   . COPD (chronic obstructive pulmonary disease) ([facility])    COPD  . Coronary artery disease    a. 01/2004 s/p PCI and Taxus drug-eluting stent placement to the distal RCA (3.5 x 12 mm); b. 07/2017 Lexiscan MV: no ischemia. Sm area of apicl thinning, likely attenuation. EF 33% (GI uptake noted)-->Low risk.  . Degenerative joint disease   . Essential hypertension   . Hyperlipidemia   . Hypertension   .  Prostate CA [facility])    prostate ca dx 20 yrs ago  . PVD (peripheral vascular disease) ([place])   . Tobacco abuse    Social History   Socioeconomic History  . Marital status: Legally Separated    Spouse name: Not on file  . Number of children: Not on file  . Years of education: Not on file  . Highest education level: Not on file  Occupational History  . Occupation: retired  Scientific laboratory technician  . Financial resource strain: Not on file  . Food insecurity:    Worry: Not on file    Inability: Not on file  . Transportation needs:    Medical: Not on file    Non-medical:  Not on file  Tobacco Use  . Smoking status: Former Smoker    Packs/day: 1.00    Years: 50.00    Pack years: 50.00    Types: Cigarettes  . Smokeless tobacco: Never Used  Substance and Sexual Activity  . Alcohol use: Yes    Comment: social   . Drug use: No  . Sexual activity: Never  Lifestyle  . Physical activity:    Days per week: Not on file    Minutes per session: Not on file  . Stress: Not on file  Relationships  . Social connections:    Talks on phone: Not on file    Gets together: Not on file    Attends religious service: Not on file    Active member of club or organization: Not on file    Attends meetings of clubs or organizations: Not on file    Relationship status: Not on file  . Intimate partner violence:    Fear of current or ex partner: Not on file    Emotionally abused: Not on file    Physically abused: Not on file    Forced sexual activity: Not on file  Other Topics Concern  . Not on file  Social History Narrative  . Not on file   Past Surgical History:  Procedure Laterality Date  . CARDIAC CATHETERIZATION    . CORONARY ANGIOPLASTY WITH STENT PLACEMENT    . TOTAL HIP ARTHROPLASTY     right   Family History  Problem Relation Age of Onset  . Hypertension Mother   . Hyperlipidemia Mother   . Heart attack Mother   . Hypertension Father   . Hyperlipidemia Father   . Heart attack Father   . Prostate cancer Neg Hx   . Bladder Cancer Neg Hx   . Kidney cancer Neg Hx    Allergies  Allergen Reactions  . Amlodipine     Constipation   . Benazepril-Hydrochlorothiazide     Constipation   . Chlorthalidone Other (See Comments)    Hyponatremia Hyponatremia  . Lisinopril   . Other Other (See Comments)    "ANY BLOOD PRESSURE MEDICATIONS THAT I'VE TRIED" - PT. DOES NOT REMEMBER WHICH ONES "ANY BLOOD PRESSURE MEDICATIONS THAT I'VE TRIED"- PT. DOES NOT REMEMBER WHICH ONES- CONSTIPATION      Assessment & Plan:  Patient last seen in 2013.  The patient  presents today from the [facility] with a chief complaint of progressively worsening bilateral thigh and calf cramps.  The patient notes pain located to the bilateral thighs and calves with ambulation and walking for long distances.  The patient notes that the discomfort has progressively worsened.  The patient notes a shortening in his claudication distance.  The patient denies any rest pain or  ulceration to the bilateral lower extremity.  The patient does have an orthopedic history including a "left thigh rod" and right hip replacement.  The patient is also complaining of pain in the bilateral knee joints.  The patient denies any fever, nausea vomiting.  1. Lower extremity pain, bilateral - Stable Patient presents with worsening cramping to the bilateral thighs and calves Patient notes that his symptoms have progressed to the point that they have become lifestyle limiting.  He feels that he is unable to function on a daily basis as he cannot walk long distances without experiencing discomfort I will bring the patient back and have him undergo a bilateral ABI to assess for any contributing peripheral artery disease I have discussed with the patient at length the risk factors for and pathogenesis of atherosclerotic disease and encouraged a healthy diet, regular exercise regimen and blood pressure / glucose control.  The patient was encouraged to call the office in the interim if he experiences any claudication like symptoms, rest pain or ulcers to his feet / toes.  - VAS [place] ABI WITH/WO TBI; Future  2. Essential hypertension - Stable Encouraged good control as its slows the progression of atherosclerotic disease  3. Other hyperlipidemia - Stable Encouraged good control as its slows the progression of atherosclerotic disease  Current Outpatient Medications on File Prior to Visit  Medication Sig Dispense Refill  . amLODipine (NORVASC) 5 MG tablet     . aspirin EC 81 MG tablet Take  81 mg by mouth daily.    Marland Kitchen atorvastatin (LIPITOR) 40 MG tablet Take 40 mg by mouth daily.    Marland Kitchen CALCIUM POLYCARBOPHIL PO Take by mouth daily.    . carvedilol (COREG) 25 MG tablet Take 25 mg by mouth 2 (two) times daily with a meal.    . fluticasone-salmeterol (ADVAIR HFA) 115-21 MCG/ACT inhaler Inhale 2 puffs into the lungs 2 (two) times daily.    . hydrALAZINE (APRESOLINE) 25 MG tablet Take 1 tablet (25 mg total) by mouth 2 (two) times daily. 60 tablet 6  . isosorbide mononitrate (IMDUR) 60 MG 24 hr tablet Take 1 tablet (60 mg total) by mouth daily. 30 tablet 6  . levothyroxine (SYNTHROID, LEVOTHROID) 50 MCG tablet Take 50 mcg by mouth daily before breakfast.    . magnesium oxide (MAG-OX) 400 MG tablet Take 400 mg by mouth 2 (two) times daily.    . nitroGLYCERIN (NITROSTAT) 0.4 MG SL tablet Take 0.4 mg by mouth every 5 (five) minutes x 3 doses as needed for chest pain. As needed for chest pain    . tiotropium (SPIRIVA HANDIHALER) 18 MCG inhalation capsule Place 18 mcg into inhaler and inhale daily.    Marland Kitchen albuterol (PROAIR HFA) 108 (90 Base) MCG/ACT inhaler Inhale 2 puffs into the lungs every 6 (six) hours as needed for wheezing or shortness of breath. Reported on 04/21/2016 (Patient not taking: Reported on 05/15/2018) 1 Inhaler 0  . furosemide (LASIX) 20 MG tablet Take 20 mg by mouth 2 (two) times daily.     No current facility-administered medications on file prior to visit.    There are no Patient Instructions on file for this visit. No follow-ups on file.  Betty Brooks A Alberta Lenhard, PA-C

## 2018-05-29 ENCOUNTER — Encounter (INDEPENDENT_AMBULATORY_CARE_PROVIDER_SITE_OTHER): Payer: Self-pay | Admitting: Vascular Surgery

## 2018-05-29 ENCOUNTER — Ambulatory Visit (INDEPENDENT_AMBULATORY_CARE_PROVIDER_SITE_OTHER): Payer: Medicare HMO

## 2018-05-29 ENCOUNTER — Ambulatory Visit (INDEPENDENT_AMBULATORY_CARE_PROVIDER_SITE_OTHER): Payer: Medicare HMO | Admitting: Vascular Surgery

## 2018-05-29 VITALS — BP 143/70 | HR 72 | Resp 18 | Ht 67.0 in | Wt 304.0 lb

## 2018-05-29 DIAGNOSIS — M79605 Pain in left leg: Secondary | ICD-10-CM

## 2018-05-29 DIAGNOSIS — M79604 Pain in right leg: Secondary | ICD-10-CM

## 2018-05-29 DIAGNOSIS — E7849 Other hyperlipidemia: Secondary | ICD-10-CM | POA: Diagnosis not present

## 2018-05-29 DIAGNOSIS — I10 Essential (primary) hypertension: Secondary | ICD-10-CM

## 2018-05-29 NOTE — Progress Notes (Signed)
Subjective:    Patient ID: [name], male    DOB: [birth year], 83 y.o.   MRN: [id number] Chief Complaint  Patient presents with  . Follow-up    pt conv abi   The patient presents to review vascular studies.  The patient was last seen on May 15, 2018 for evaluation of bilateral lower extremity claudication.  The patient's symptoms are stable.  Patient denies any rest pain or ulcerations of the bilateral lower extremity.  The patient underwent a bilateral ABI which was notable for bilateral ankle brachial indices within normal range.  Bilateral toe brachial indices were normal.  Right ABI 0.99  / Left ABI 0.99. The patient denies any fever, nausea vomiting.  Review of Systems  Constitutional: Negative.   HENT: Negative.   Eyes: Negative.   Respiratory: Negative.   Cardiovascular:       Calf Cramping  Gastrointestinal: Negative.   Endocrine: Negative.   Genitourinary: Negative.   Musculoskeletal: Negative.   Allergic/Immunologic: Negative.   Neurological: Negative.   Hematological: Negative.   Psychiatric/Behavioral: Negative.       Objective:   Physical Exam  Constitutional: He is oriented to person, place, and time. He appears well-developed and well-nourished. No distress.  HENT:  Head: Normocephalic and atraumatic.  Right Ear: External ear normal.  Left Ear: External ear normal.  Mouth/Throat: Oropharynx is clear and moist.  Eyes: Pupils are equal, round, and reactive to light. Conjunctivae and EOM are normal.  Neck: Normal range of motion.  Cardiovascular: Normal rate, regular rhythm, normal heart sounds and intact distal pulses.  Pulses:      Radial pulses are 2+ on the right side, and 2+ on the left side.  Hard to palpate pedal pulses due to body habitus however the bilateral feet are warm  Pulmonary/Chest: Effort normal and breath sounds normal.  Musculoskeletal: Normal range of motion. He exhibits edema.  Neurological: He is alert and oriented to person, place,  and time.  Skin: Skin is warm and dry. He is not diaphoretic.  Psychiatric: He has a normal mood and affect. His behavior is normal. Judgment and thought content normal.   BP (!) 143/70 (BP Location: Right Arm)   Pulse 72   Resp 18   Ht 5\' 7"  (1.702 m)   Wt (!) 304 lb (137.9 kg)   BMI 47.61 kg/m   Past Medical History:  Diagnosis Date  . Alcoholism ([place])   . Asthma   . Bipolar affective disorder ([place])   . Chronic combined systolic and diastolic CHF (congestive heart failure) ([place])    a. 08/2016 Echo: EF 40-45%, mild AS, mild to mod MR, mildly dil LA/RA, mild-mod TR; b. 07/2017 Echo: EF 40-45%, mod LVH, Gr1 DD, mild to mod AS, mildly dil LA, nl RV fxn.  . CKD (chronic kidney disease), stage III ([place])   . COPD (chronic obstructive pulmonary disease) ([facility])    COPD  . Coronary artery disease    a. 01/2004 s/p PCI and Taxus drug-eluting stent placement to the distal RCA (3.5 x 12 mm); b. 07/2017 Lexiscan MV: no ischemia. Sm area of apicl thinning, likely attenuation. EF 33% (GI uptake noted)-->Low risk.  . Degenerative joint disease   . Essential hypertension   . Hyperlipidemia   . Hypertension   . Prostate CA [facility])    prostate ca dx 20 yrs ago  . PVD (peripheral vascular disease) ([place])   . Tobacco abuse    Social History   Socioeconomic History  .  Marital status: Legally Separated    Spouse name: Not on file  . Number of children: Not on file  . Years of education: Not on file  . Highest education level: Not on file  Occupational History  . Occupation: retired  Scientific laboratory technician  . Financial resource strain: Not on file  . Food insecurity:    Worry: Not on file    Inability: Not on file  . Transportation needs:    Medical: Not on file    Non-medical: Not on file  Tobacco Use  . Smoking status: Former Smoker    Packs/day: 1.00    Years: 50.00    Pack years: 50.00    Types: Cigarettes  . Smokeless tobacco: Never Used  Substance and Sexual Activity  . Alcohol use: Yes     Comment: social   . Drug use: No  . Sexual activity: Never  Lifestyle  . Physical activity:    Days per week: Not on file    Minutes per session: Not on file  . Stress: Not on file  Relationships  . Social connections:    Talks on phone: Not on file    Gets together: Not on file    Attends religious service: Not on file    Active member of club or organization: Not on file    Attends meetings of clubs or organizations: Not on file    Relationship status: Not on file  . Intimate partner violence:    Fear of current or ex partner: Not on file    Emotionally abused: Not on file    Physically abused: Not on file    Forced sexual activity: Not on file  Other Topics Concern  . Not on file  Social History Narrative  . Not on file   Past Surgical History:  Procedure Laterality Date  . CARDIAC CATHETERIZATION    . CORONARY ANGIOPLASTY WITH STENT PLACEMENT    . TOTAL HIP ARTHROPLASTY     right   Family History  Problem Relation Age of Onset  . Hypertension Mother   . Hyperlipidemia Mother   . Heart attack Mother   . Hypertension Father   . Hyperlipidemia Father   . Heart attack Father   . Prostate cancer Neg Hx   . Bladder Cancer Neg Hx   . Kidney cancer Neg Hx    Allergies  Allergen Reactions  . Amlodipine     Constipation   . Benazepril-Hydrochlorothiazide     Constipation   . Chlorthalidone Other (See Comments)    Hyponatremia Hyponatremia  . Lisinopril   . Other Other (See Comments)    "ANY BLOOD PRESSURE MEDICATIONS THAT I'VE TRIED" - PT. DOES NOT REMEMBER WHICH ONES "ANY BLOOD PRESSURE MEDICATIONS THAT I'VE TRIED"- PT. DOES NOT REMEMBER WHICH ONES- CONSTIPATION      Assessment & Plan:  The patient presents to review vascular studies.  The patient was last seen on May 15, 2018 for evaluation of bilateral lower extremity claudication.  The patient's symptoms are stable.  Patient denies any rest pain or ulcerations of the bilateral lower extremity.  The patient  underwent a bilateral ABI which was notable for bilateral ankle brachial indices within normal range.  Bilateral toe brachial indices were normal.  Right ABI 0.99  / Left ABI 0.99. The patient denies any fever, nausea vomiting.  1. Lower extremity pain, bilateral - Stable Patient with essentially normal ABI to the bilateral lower extremity. I do not feel that the patient's intermittent  claudication is coming from arterial insufficiency Recommend that the patient follow-up with his primary care physician and be ruled out for spinal DDD or OA Recommend repeat ABI in 2 to 3 years Follow-up PRN I have discussed with the patient at length the risk factors for and pathogenesis of atherosclerotic disease and encouraged a healthy diet, regular exercise regimen and blood pressure / glucose control.  The patient was encouraged to call the office in the interim if he experiences any claudication like symptoms, rest pain or ulcers to his feet / toes.  2. Other hyperlipidemia - Stable Encouraged good control as its slows the progression of atherosclerotic disease  3. Essential hypertension - Stable Encouraged good control as its slows the progression of atherosclerotic disease  Current Outpatient Medications on File Prior to Visit  Medication Sig Dispense Refill  . amLODipine (NORVASC) 5 MG tablet     . aspirin EC 81 MG tablet Take 81 mg by mouth daily.    Marland Kitchen atorvastatin (LIPITOR) 40 MG tablet Take 40 mg by mouth daily.    Marland Kitchen CALCIUM POLYCARBOPHIL PO Take by mouth daily.    . carvedilol (COREG) 25 MG tablet Take 25 mg by mouth 2 (two) times daily with a meal.    . fluticasone-salmeterol (ADVAIR HFA) 115-21 MCG/ACT inhaler Inhale 2 puffs into the lungs 2 (two) times daily.    . furosemide (LASIX) 20 MG tablet Take 20 mg by mouth 2 (two) times daily.    . hydrALAZINE (APRESOLINE) 25 MG tablet Take 1 tablet (25 mg total) by mouth 2 (two) times daily. 60 tablet 6  . isosorbide mononitrate (IMDUR) 60 MG 24  hr tablet Take 1 tablet (60 mg total) by mouth daily. 30 tablet 6  . levothyroxine (SYNTHROID, LEVOTHROID) 50 MCG tablet Take 50 mcg by mouth daily before breakfast.    . magnesium oxide (MAG-OX) 400 MG tablet Take 400 mg by mouth 2 (two) times daily.    . nitroGLYCERIN (NITROSTAT) 0.4 MG SL tablet Take 0.4 mg by mouth every 5 (five) minutes x 3 doses as needed for chest pain. As needed for chest pain    . tiotropium (SPIRIVA HANDIHALER) 18 MCG inhalation capsule Place 18 mcg into inhaler and inhale daily.    Marland Kitchen albuterol (PROAIR HFA) 108 (90 Base) MCG/ACT inhaler Inhale 2 puffs into the lungs every 6 (six) hours as needed for wheezing or shortness of breath. Reported on 04/21/2016 (Patient not taking: Reported on 05/15/2018) 1 Inhaler 0   No current facility-administered medications on file prior to visit.    There are no Patient Instructions on file for this visit. No follow-ups on file.  Tico Crotteau A Dmonte Maher, PA-C

## 2018-05-30 ENCOUNTER — Encounter

## 2018-05-30 ENCOUNTER — Ambulatory Visit: Payer: Medicare HMO | Admitting: Nurse Practitioner

## 2018-06-14 ENCOUNTER — Encounter: Payer: Self-pay | Admitting: Urology

## 2018-06-14 ENCOUNTER — Ambulatory Visit (INDEPENDENT_AMBULATORY_CARE_PROVIDER_SITE_OTHER): Payer: Medicare HMO | Admitting: Urology

## 2018-06-14 ENCOUNTER — Encounter

## 2018-06-14 VITALS — BP 168/66 | HR 65 | Ht 68.0 in | Wt 300.0 lb

## 2018-06-14 DIAGNOSIS — R350 Frequency of micturition: Secondary | ICD-10-CM | POA: Diagnosis not present

## 2018-06-14 DIAGNOSIS — Z8546 Personal history of malignant neoplasm of prostate: Secondary | ICD-10-CM | POA: Diagnosis not present

## 2018-06-14 LAB — MICROSCOPIC EXAMINATION: RBC, UA: NONE SEEN /hpf (ref 0–2)

## 2018-06-14 LAB — URINALYSIS, COMPLETE
Bilirubin, UA: NEGATIVE
GLUCOSE, UA: NEGATIVE
Ketones, UA: NEGATIVE
Nitrite, UA: NEGATIVE
SPEC GRAV UA: 1.02 (ref 1.005–1.030)
UUROB: 0.2 mg/dL (ref 0.2–1.0)
pH, UA: 5.5 (ref 5.0–7.5)

## 2018-06-14 LAB — BLADDER SCAN AMB NON-IMAGING: SCAN RESULT: 86

## 2018-06-14 NOTE — Progress Notes (Signed)
06/14/2018 1:43 PM   [name] 01-Mar-1935 [id number]  Referring provider: Marguerita Merles, [address] [place], [place] [address]  Chief Complaint  Patient presents with  . Urinary Frequency    HPI: F/u -   1) LUTS - weak stream, frequency - PVR was 178 and he was started on tamsulosin for weak stream. PVR declined to 83 ml.   2) PCa - treated with radiation about 1994 with a PSA <0.1 in Nov 15, 2017 (PCP report). He's been told he had "polyps in the bladder".   3) ED - He also had erectile dysfunction and had an inflatable penile prosthesis placed around 2010 and then needed a revision in 2014 with Dr. Odis Luster at [facility] does not cycle and Dr. Bernardo Heater referred to Dr. Francesca Jewett. Dr. Francesca Jewett evaluated and said pt would need pulm and cardiac clearance.   4) CKD - He has chronic kidney disease with a creatinine around 2 and a GFR of 35.   5) Bilateral renal cyst- Likely Bosniak I and II cyst, low risk of malignancy - [place] 2017, MRI 08/2016 CT Aug 2018 - stable   Pt returns in management of the above. I reviewed Dr. Cherrie Gauze, Dr. Dene Gentry and Dr. Mabeline Caras notes. He returns today for worsening frequency and urgency. No dysuria or gross hematuria. PVR 86 ml. Noc x 3. He has a good flow, other times slow.    PMH: Past Medical History:  Diagnosis Date  . Alcoholism ([place])   . Asthma   . Bipolar affective disorder ([place])   . Chronic combined systolic and diastolic CHF (congestive heart failure) ([place])    a. 08/2016 Echo: EF 40-45%, mild AS, mild to mod MR, mildly dil LA/RA, mild-mod TR; b. 07/2017 Echo: EF 40-45%, mod LVH, Gr1 DD, mild to mod AS, mildly dil LA, nl RV fxn.  . CKD (chronic kidney disease), stage III ([place])   . COPD (chronic obstructive pulmonary disease) ([facility])    COPD  . Coronary artery disease    a. 01/2004 s/p PCI and Taxus drug-eluting stent placement to the distal RCA (3.5 x 12 mm); b. 07/2017 Lexiscan MV: no ischemia. Sm area of apicl thinning, likely  attenuation. EF 33% (GI uptake noted)-->Low risk.  . Degenerative joint disease   . Essential hypertension   . Hyperlipidemia   . Hypertension   . Prostate CA [facility])    prostate ca dx 20 yrs ago  . PVD (peripheral vascular disease) ([place])   . Tobacco abuse     Surgical History: Past Surgical History:  Procedure Laterality Date  . CARDIAC CATHETERIZATION    . CORONARY ANGIOPLASTY WITH STENT PLACEMENT    . TOTAL HIP ARTHROPLASTY     right    Home Medications:  Allergies as of 06/14/2018      Reactions   Amlodipine    Constipation   Benazepril-hydrochlorothiazide    Constipation   Chlorthalidone Other (See Comments)   Hyponatremia Hyponatremia   Lisinopril    Other Other (See Comments)   "ANY BLOOD PRESSURE MEDICATIONS THAT I'VE TRIED" - PT. DOES NOT REMEMBER WHICH ONES "ANY BLOOD PRESSURE MEDICATIONS THAT I'VE TRIED"- PT. DOES NOT REMEMBER WHICH ONES- CONSTIPATION      Medication List        Accurate as of 06/14/18  1:43 PM. Always use your most recent med list.          albuterol 108 (90 Base) MCG/ACT inhaler Commonly known as:  PROAIR HFA Inhale  2 puffs into the lungs every 6 (six) hours as needed for wheezing or shortness of breath. Reported on 04/21/2016   amLODipine 5 MG tablet Commonly known as:  NORVASC   aspirin EC 81 MG tablet Take 81 mg by mouth daily.   atorvastatin 40 MG tablet Commonly known as:  LIPITOR Take 40 mg by mouth daily.   CALCIUM POLYCARBOPHIL PO Take by mouth daily.   carvedilol 25 MG tablet Commonly known as:  COREG Take 25 mg by mouth 2 (two) times daily with a meal.   fluticasone-salmeterol 115-21 MCG/ACT inhaler Commonly known as:  ADVAIR HFA Inhale 2 puffs into the lungs 2 (two) times daily.   furosemide 20 MG tablet Commonly known as:  LASIX Take 20 mg by mouth 2 (two) times daily.   hydrALAZINE 25 MG tablet Commonly known as:  APRESOLINE Take 1 tablet (25 mg total) by mouth 2 (two) times daily.   isosorbide  mononitrate 60 MG 24 hr tablet Commonly known as:  IMDUR Take 1 tablet (60 mg total) by mouth daily.   levothyroxine 50 MCG tablet Commonly known as:  SYNTHROID, LEVOTHROID Take 50 mcg by mouth daily before breakfast.   magnesium oxide 400 MG tablet Commonly known as:  MAG-OX Take 400 mg by mouth 2 (two) times daily.   NITROSTAT 0.4 MG SL tablet Generic drug:  nitroGLYCERIN Take 0.4 mg by mouth every 5 (five) minutes x 3 doses as needed for chest pain. As needed for chest pain   SPIRIVA HANDIHALER 18 MCG inhalation capsule Generic drug:  tiotropium Place 18 mcg into inhaler and inhale daily.       Allergies:  Allergies  Allergen Reactions  . Amlodipine     Constipation   . Benazepril-Hydrochlorothiazide     Constipation   . Chlorthalidone Other (See Comments)    Hyponatremia Hyponatremia  . Lisinopril   . Other Other (See Comments)    "ANY BLOOD PRESSURE MEDICATIONS THAT I'VE TRIED" - PT. DOES NOT REMEMBER WHICH ONES "ANY BLOOD PRESSURE MEDICATIONS THAT I'VE TRIED"- PT. DOES NOT REMEMBER WHICH ONES- CONSTIPATION    Family History: Family History  Problem Relation Age of Onset  . Hypertension Mother   . Hyperlipidemia Mother   . Heart attack Mother   . Hypertension Father   . Hyperlipidemia Father   . Heart attack Father   . Prostate cancer Neg Hx   . Bladder Cancer Neg Hx   . Kidney cancer Neg Hx     Social History:  reports that he has quit smoking. His smoking use included cigarettes. He has a 50.00 pack-year smoking history. He has never used smokeless tobacco. He reports that he drinks alcohol. He reports that he does not use drugs.  ROS: UROLOGY Frequent Urination?: Yes Hard to postpone urination?: Yes Burning/pain with urination?: No Get up at night to urinate?: Yes Leakage of urine?: Yes Urine stream starts and stops?: Yes Trouble starting stream?: No Do you have to strain to urinate?: No Blood in urine?: No Urinary tract infection?:  No Sexually transmitted disease?: No Injury to kidneys or bladder?: No Painful intercourse?: No Weak stream?: Yes Erection problems?: No Penile pain?: No  Gastrointestinal Nausea?: No Vomiting?: No Indigestion/heartburn?: No Diarrhea?: No Constipation?: No  Constitutional Fever: No Night sweats?: Yes Weight loss?: No Fatigue?: Yes  Skin Skin rash/lesions?: Yes Itching?: No  Eyes Blurred vision?: No Double vision?: No  Ears/Nose/Throat Sore throat?: No Sinus problems?: Yes  Hematologic/Lymphatic Swollen glands?: No Easy bruising?: No  Cardiovascular  Leg swelling?: No Chest pain?: No  Respiratory Cough?: No Shortness of breath?: Yes  Endocrine Excessive thirst?: No  Musculoskeletal Back pain?: No Joint pain?: No  Neurological Headaches?: No Dizziness?: No  Psychologic Depression?: No Anxiety?: No  Physical Exam: Ht 5\' 8"  (1.727 m)   BMI 46.22 kg/m   Constitutional:  Alert and oriented, No acute distress. HEENT: [place] AT, moist mucus membranes.  Trachea midline, no masses. Cardiovascular: No clubbing, cyanosis, or edema. Respiratory: Normal respiratory effort, no increased work of breathing. GI: Abdomen is soft, nontender, nondistended, no abdominal masses Skin: No rashes, bruises or suspicious lesions. Neurologic: Grossly intact, no focal deficits, moving all 4 extremities. Psychiatric: Normal mood and affect.  Laboratory Data: Lab Results  Component Value Date   WBC 6.3 01/19/2018   HGB 11.5 (L) 01/19/2018   HCT 33.9 (L) 01/19/2018   MCV 84.9 01/19/2018   PLT 257 01/19/2018    Lab Results  Component Value Date   CREATININE 2.05 (H) 01/19/2018    Lab Results  Component Value Date   PSA 0.03 Test Methodology: Hybritech PSA (L) 06/13/2009    No results found for: TESTOSTERONE  Lab Results  Component Value Date   HGBA1C  06/13/2009    5.8 (NOTE) The ADA recommends the following therapeutic goal for glycemic control related to  Hgb A1c measurement: Goal of therapy: <6.5 Hgb A1c  Reference: American Diabetes Association: Clinical Practice Recommendations 2010, Diabetes Care, 2010, 33: (Suppl  1).    Urinalysis    Component Value Date/Time   COLORURINE YELLOW (A) 07/31/2017 1025   APPEARANCEUR CLEAR (A) 07/31/2017 1025   APPEARANCEUR Clear 04/03/2015 1216   LABSPEC 1.013 07/31/2017 1025   LABSPEC 1.004 04/03/2015 1216   PHURINE 5.0 07/31/2017 1025   GLUCOSEU NEGATIVE 07/31/2017 1025   GLUCOSEU Negative 04/03/2015 1216   HGBUR MODERATE (A) 07/31/2017 1025   BILIRUBINUR NEGATIVE 07/31/2017 1025   BILIRUBINUR Negative 04/03/2015 1216   KETONESUR 5 (A) 07/31/2017 1025   PROTEINUR 100 (A) 07/31/2017 1025   UROBILINOGEN 0.2 03/27/2011 0845   NITRITE NEGATIVE 07/31/2017 1025   LEUKOCYTESUR NEGATIVE 07/31/2017 1025   LEUKOCYTESUR Negative 04/03/2015 1216    Lab Results  Component Value Date   BACTERIA MANY (A) 07/31/2017     No results found for this or any previous visit. No results found for this or any previous visit. No results found for this or any previous visit. No results found for this or any previous visit. Results for orders placed during the hospital encounter of 08/31/16  US Renal   Narrative CLINICAL DATA:  Acute renal failure.  EXAM: RENAL / URINARY TRACT ULTRASOUND COMPLETE  COMPARISON:  CT, 01/01/2016  FINDINGS: Right Kidney:  Length: 12.8 cm. Increased parenchymal echogenicity. Multiple cysts the. In the midpole is a 3.1 x 2.2 x 2.3 cm cyst. In the lower pole there is a cyst which have thin septations but no other complicating features. It measures 5.2 x 3.4 x 3.3 cm. Also in the lower pole is a hypo echoic mass without internal blood flow. This is seen posteriorly. It is likely a complicated cyst. It measures 2.7 x 2.0 x 1.7 cm.  No hydronephrosis appear  Left Kidney:  Length: 13.8 cm. Increased parenchymal echogenicity. Multiple cyst. In the posterior upper pole, there  is a hypoechoic oval mass with internal blood flow measuring 3.4 x 1.9 x 2.1 cm. The also in the upper pole is a cyst with thin septations measuring 3.8 x 3.5 x 4.0 cm.  Also in upper pole is a simple appearing cyst measuring 3.2 x 4.9 x 5.5 cm.  No hydronephrosis.  Bladder:  Appears normal for degree of bladder distention.  IMPRESSION: 1. No acute finding.  No hydronephrosis. 2. Increased renal parenchyma echogenicity is consistent with medical renal disease. 3. Bilateral renal cysts. Probable complicated cyst the lower pole the right kidney. 4. Possible solid mass with apparent internal blood flow within the posterior upper pole of the left kidney. Recommend further assessment with renal MRI with and without contrast.   Electronically Signed   By: Lajean Manes M.D.   On: 09/02/2016 11:58    No results found for this or any previous visit. No results found for this or any previous visit. No results found for this or any previous visit.  Assessment & Plan:    1. Frequency, urgency - on tamsulosin. Discussed nature r/b of OAB meds and I gave him samples of vesicare and myrbetriq.  - Bladder Scan (Post Void Residual) in office - Urinalysis, Complete   No follow-ups on file.  Festus Aloe, MD  [facility] [address] [place], [place] [address] [phone]

## 2018-08-16 ENCOUNTER — Ambulatory Visit: Payer: Medicare HMO | Admitting: Cardiovascular Disease

## 2018-08-16 ENCOUNTER — Encounter: Payer: Self-pay | Admitting: Cardiovascular Disease

## 2018-08-16 VITALS — BP 130/62 | HR 62 | Ht 67.0 in | Wt 301.5 lb

## 2018-08-16 DIAGNOSIS — I5042 Chronic combined systolic (congestive) and diastolic (congestive) heart failure: Secondary | ICD-10-CM

## 2018-08-16 DIAGNOSIS — I25119 Atherosclerotic heart disease of native coronary artery with unspecified angina pectoris: Secondary | ICD-10-CM | POA: Diagnosis not present

## 2018-08-16 DIAGNOSIS — I10 Essential (primary) hypertension: Secondary | ICD-10-CM

## 2018-08-16 DIAGNOSIS — E785 Hyperlipidemia, unspecified: Secondary | ICD-10-CM | POA: Diagnosis not present

## 2018-08-16 NOTE — Progress Notes (Signed)
Cardiology Office Note   Date:  08/16/2018   ID:  [name], DOB 10/23/1935, MRN [id number]  PCP:  Marguerita Merles, MD  Cardiologist:   Kathlyn Sacramento, MD   Chief Complaint  Patient presents with  . other    1 month follow up. Meds reviewed by the pt. and medications bottles. Pt. c/o shortness of breath.       History of Present Illness: [name] is a 83 y.o. male who presents for a follow-up visit regarding hypertension and coronary artery disease. He is status post Taxus drug-eluting stent placement to the distal right coronary artery in 2005.  He has extensive medical problems that include borderline diabetes, hypertension, hyperlipidemia, chronic systolic/diastolic heart failure , sleep apnea not on CPAP, morbid obesity, previous tobacco use, CKD, bipolar disorder and peripheral arterial disease. Most recent echocardiogram in August, 2018 showed an EF of 40-45%, mild to moderate aortic stenosis, mildly dilated right and left atrium. He had a nuclear stress test done in August 2018 for chest pain which showed no evidence of ischemia. He was seen by Gerald Stabs in May and at that time he reported some side effects with amlodipine.  He was switched to hydralazine and Imdur were but decided to not to make the switch because he tolerated amlodipine after that.  He has been doing reasonably well with no recent chest pain.  Shortness of breath is stable.  No significant leg edema.  He complains of poor quality sleep and he has not been able to obtain a CPAP machine.  He lost his machine in the past and was not able to get a replacement.   Past Medical History:  Diagnosis Date  . Alcoholism ([place])   . Asthma   . Bipolar affective disorder ([place])   . Chronic combined systolic and diastolic CHF (congestive heart failure) ([place])    a. 08/2016 Echo: EF 40-45%, mild AS, mild to mod MR, mildly dil LA/RA, mild-mod TR; b. 07/2017 Echo: EF 40-45%, mod LVH, Gr1 DD, mild to mod AS, mildly dil LA,  nl RV fxn.  . CKD (chronic kidney disease), stage III ([place])   . COPD (chronic obstructive pulmonary disease) ([facility])    COPD  . Coronary artery disease    a. 01/2004 s/p PCI and Taxus drug-eluting stent placement to the distal RCA (3.5 x 12 mm); b. 07/2017 Lexiscan MV: no ischemia. Sm area of apicl thinning, likely attenuation. EF 33% (GI uptake noted)-->Low risk.  . Degenerative joint disease   . Essential hypertension   . Hyperlipidemia   . Hypertension   . Prostate CA [facility])    prostate ca dx 20 yrs ago  . PVD (peripheral vascular disease) ([place])   . Tobacco abuse     Past Surgical History:  Procedure Laterality Date  . CARDIAC CATHETERIZATION    . CORONARY ANGIOPLASTY WITH STENT PLACEMENT    . TOTAL HIP ARTHROPLASTY     right     Current Outpatient Medications  Medication Sig Dispense Refill  . albuterol (PROAIR HFA) 108 (90 Base) MCG/ACT inhaler Inhale 2 puffs into the lungs every 6 (six) hours as needed for wheezing or shortness of breath. Reported on 04/21/2016 1 Inhaler 0  . amLODipine (NORVASC) 5 MG tablet     . aspirin EC 81 MG tablet Take 81 mg by mouth daily.    Marland Kitchen atorvastatin (LIPITOR) 40 MG tablet Take 40 mg by mouth daily.    . carvedilol (COREG) 25 MG tablet  Take 25 mg by mouth 2 (two) times daily with a meal.    . fluticasone-salmeterol (ADVAIR HFA) 115-21 MCG/ACT inhaler Inhale 2 puffs into the lungs 2 (two) times daily.    . furosemide (LASIX) 20 MG tablet Take 20 mg by mouth 2 (two) times daily.    Marland Kitchen ipratropium-albuterol (DUONEB) 0.5-2.5 (3) MG/3ML SOLN 3 mLs every 4 (four) hours as needed.     Marland Kitchen levothyroxine (SYNTHROID, LEVOTHROID) 50 MCG tablet Take 50 mcg by mouth daily before breakfast.    . magnesium oxide (MAG-OX) 400 MG tablet Take 400 mg by mouth 2 (two) times daily.    . nitroGLYCERIN (NITROSTAT) 0.4 MG SL tablet Take 0.4 mg by mouth every 5 (five) minutes x 3 doses as needed for chest pain. As needed for chest pain     No current facility-administered  medications for this visit.     Allergies:   Amlodipine; Benazepril-hydrochlorothiazide; Chlorthalidone; Lisinopril; and Other    Social History:  The patient  reports that he has quit smoking. His smoking use included cigarettes. He has a 50.00 pack-year smoking history. He has never used smokeless tobacco. He reports that he drinks alcohol. He reports that he does not use drugs.   Family History:  The patient's family history includes Heart attack in his father and mother; Hyperlipidemia in his father and mother; Hypertension in his father and mother.    ROS:  Please see the history of present illness.   Otherwise, review of systems are positive for none.   All other systems are reviewed and negative.    PHYSICAL EXAM: VS:  BP 130/62 (BP Location: Left Arm, Patient Position: Sitting, Cuff Size: Normal)   Pulse 62   Ht 5\' 7"  (1.702 m)   Wt (!) 301 lb 8 oz (136.8 kg)   BMI 47.22 kg/m  , BMI Body mass index is 47.22 kg/m. GEN: Well nourished, well developed, in no acute distress  HEENT: normal  Neck: no JVD, carotid bruits, or masses Cardiac: RRR; no murmurs, rubs, or gallops, mild edema  Respiratory:  clear to auscultation bilaterally, normal work of breathing GI: soft, nontender, nondistended, + BS MS: no deformity or atrophy  Skin: warm and dry, no rash Neuro:  Strength and sensation are intact Psych: euthymic mood, full affect   EKG:  EKG is ordered today. The ekg ordered today demonstrates normal sinus rhythm with LVH and nonspecific T wave changes.   Recent Labs: 01/19/2018: BUN 44; Creatinine, Ser 2.05; Hemoglobin 11.5; Platelets 257; Potassium 4.5; Sodium 131    Lipid Panel    Component Value Date/Time   CHOL 189 04/26/2016 0810   CHOL 138 05/31/2014 0416   TRIG 129 04/26/2016 0810   TRIG 75 05/31/2014 0416   HDL 66 04/26/2016 0810   HDL 40 05/31/2014 0416   CHOLHDL 2.9 04/26/2016 0810   CHOLHDL 2.3 10/30/2010 0430   VLDL 15 05/31/2014 0416   LDLCALC 97  04/26/2016 0810   LDLCALC 83 05/31/2014 0416      Wt Readings from Last 3 Encounters:  08/16/18 (!) 301 lb 8 oz (136.8 kg)  06/14/18 300 lb (136.1 kg)  05/29/18 (!) 304 lb (137.9 kg)      No flowsheet data found.    ASSESSMENT AND PLAN:  1.   Coronary artery disease involving native coronary arteries with mild angina: Controlled with medications overall.  Stress test in August, 2018 was low risk.  2. Chronic diastolic/systolic heart failure:  Most recent ejection fraction was  40-45%.   He appears to be euvolemic on current dose of furosemide 20 mg twice daily.  Continue treatment with carvedilol.  The patient was supposed to be on a combination of hydralazine and Imdur but he preferred staying on amlodipine for ease of administration.  3. Essential hypertension: Blood pressure is controlled.  Continue current medications.  4. Hyperlipidemia: Continue treatment with atorvastatin with a target LDL of less than 70.   5.  The patient is thinking about having a penile implant.  He is considered at moderate risk from a cardiac standpoint but he might be higher risk due to his untreated sleep apnea which seems to be severe.  Disposition:   FU in 4 months  Signed,  Kathlyn Sacramento, MD  08/16/2018 9:16 AM    [place]

## 2018-08-16 NOTE — Patient Instructions (Signed)
Medication Instructions: Your physician recommends that you continue on your current medications as directed. Please refer to the Current Medication list given to you today.  If you need a refill on your cardiac medications before your next appointment, please call your pharmacy.   Follow-Up: Your physician wants you to follow-up in 4 months with an APP. You will receive a reminder letter in the mail two months in advance. If you don't receive a letter, please call our office at [phone] to schedule this follow-up appointment.  Thank you for choosing [facility] at [facility]!

## 2018-11-21 ENCOUNTER — Ambulatory Visit: Payer: Medicare HMO | Admitting: Urology

## 2019-04-01 ENCOUNTER — Encounter: Payer: Self-pay | Admitting: Emergency Medicine

## 2019-04-01 ENCOUNTER — Other Ambulatory Visit: Payer: Self-pay

## 2019-04-01 ENCOUNTER — Inpatient Hospital Stay
Admission: EM | Admit: 2019-04-01 | Discharge: 2019-04-02 | DRG: 247 | Disposition: A | Discharge status: Skilled Nursing Facility | Payer: Medicare HMO | Attending: Internal Medicine | Admitting: Internal Medicine

## 2019-04-01 ENCOUNTER — Emergency Department: Payer: Medicare HMO

## 2019-04-01 ENCOUNTER — Encounter
Admission: EM | Disposition: A | Discharge status: Skilled Nursing Facility | Payer: Self-pay | Source: Home / Self Care | Attending: Internal Medicine

## 2019-04-01 ENCOUNTER — Inpatient Hospital Stay (HOSPITAL_COMMUNITY)
Admit: 2019-04-01 | Discharge: 2019-04-01 | Disposition: A | Discharge status: Home / Self Care | Payer: Medicare HMO | Attending: Internal Medicine | Admitting: Internal Medicine

## 2019-04-01 DIAGNOSIS — Z79890 Hormone replacement therapy (postmenopausal): Secondary | ICD-10-CM | POA: Diagnosis not present

## 2019-04-01 DIAGNOSIS — Z7982 Long term (current) use of aspirin: Secondary | ICD-10-CM | POA: Diagnosis not present

## 2019-04-01 DIAGNOSIS — Z9181 History of falling: Secondary | ICD-10-CM | POA: Diagnosis not present

## 2019-04-01 DIAGNOSIS — I361 Nonrheumatic tricuspid (valve) insufficiency: Secondary | ICD-10-CM | POA: Diagnosis not present

## 2019-04-01 DIAGNOSIS — Z6841 Body Mass Index (BMI) 40.0 and over, adult: Secondary | ICD-10-CM | POA: Diagnosis not present

## 2019-04-01 DIAGNOSIS — G4733 Obstructive sleep apnea (adult) (pediatric): Secondary | ICD-10-CM | POA: Diagnosis present

## 2019-04-01 DIAGNOSIS — N179 Acute kidney failure, unspecified: Secondary | ICD-10-CM | POA: Diagnosis present

## 2019-04-01 DIAGNOSIS — I130 Hypertensive heart and chronic kidney disease with heart failure and stage 1 through stage 4 chronic kidney disease, or unspecified chronic kidney disease: Secondary | ICD-10-CM | POA: Diagnosis present

## 2019-04-01 DIAGNOSIS — I5022 Chronic systolic (congestive) heart failure: Secondary | ICD-10-CM

## 2019-04-01 DIAGNOSIS — F1020 Alcohol dependence, uncomplicated: Secondary | ICD-10-CM | POA: Diagnosis present

## 2019-04-01 DIAGNOSIS — N183 Chronic kidney disease, stage 3 (moderate): Secondary | ICD-10-CM

## 2019-04-01 DIAGNOSIS — I10 Essential (primary) hypertension: Secondary | ICD-10-CM | POA: Diagnosis not present

## 2019-04-01 DIAGNOSIS — Z87891 Personal history of nicotine dependence: Secondary | ICD-10-CM

## 2019-04-01 DIAGNOSIS — I2111 ST elevation (STEMI) myocardial infarction involving right coronary artery: Secondary | ICD-10-CM | POA: Diagnosis present

## 2019-04-01 DIAGNOSIS — I739 Peripheral vascular disease, unspecified: Secondary | ICD-10-CM | POA: Diagnosis present

## 2019-04-01 DIAGNOSIS — F319 Bipolar disorder, unspecified: Secondary | ICD-10-CM | POA: Diagnosis present

## 2019-04-01 DIAGNOSIS — J449 Chronic obstructive pulmonary disease, unspecified: Secondary | ICD-10-CM | POA: Diagnosis present

## 2019-04-01 DIAGNOSIS — I2510 Atherosclerotic heart disease of native coronary artery without angina pectoris: Secondary | ICD-10-CM | POA: Diagnosis present

## 2019-04-01 DIAGNOSIS — Z79899 Other long term (current) drug therapy: Secondary | ICD-10-CM

## 2019-04-01 DIAGNOSIS — I5042 Chronic combined systolic (congestive) and diastolic (congestive) heart failure: Secondary | ICD-10-CM | POA: Diagnosis present

## 2019-04-01 DIAGNOSIS — Z8249 Family history of ischemic heart disease and other diseases of the circulatory system: Secondary | ICD-10-CM

## 2019-04-01 DIAGNOSIS — Z888 Allergy status to other drugs, medicaments and biological substances status: Secondary | ICD-10-CM

## 2019-04-01 DIAGNOSIS — Z8546 Personal history of malignant neoplasm of prostate: Secondary | ICD-10-CM

## 2019-04-01 DIAGNOSIS — T82855A Stenosis of coronary artery stent, initial encounter: Secondary | ICD-10-CM | POA: Diagnosis present

## 2019-04-01 DIAGNOSIS — N184 Chronic kidney disease, stage 4 (severe): Secondary | ICD-10-CM | POA: Diagnosis present

## 2019-04-01 DIAGNOSIS — E785 Hyperlipidemia, unspecified: Secondary | ICD-10-CM | POA: Diagnosis not present

## 2019-04-01 DIAGNOSIS — I213 ST elevation (STEMI) myocardial infarction of unspecified site: Secondary | ICD-10-CM | POA: Diagnosis present

## 2019-04-01 DIAGNOSIS — R079 Chest pain, unspecified: Secondary | ICD-10-CM | POA: Diagnosis not present

## 2019-04-01 DIAGNOSIS — I252 Old myocardial infarction: Secondary | ICD-10-CM

## 2019-04-01 DIAGNOSIS — Z955 Presence of coronary angioplasty implant and graft: Secondary | ICD-10-CM

## 2019-04-01 DIAGNOSIS — D638 Anemia in other chronic diseases classified elsewhere: Secondary | ICD-10-CM | POA: Diagnosis present

## 2019-04-01 DIAGNOSIS — Z96649 Presence of unspecified artificial hip joint: Secondary | ICD-10-CM | POA: Diagnosis present

## 2019-04-01 HISTORY — PX: CORONARY ANGIOGRAPHY: CATH118303

## 2019-04-01 HISTORY — PX: CORONARY/GRAFT ACUTE MI REVASCULARIZATION: CATH118305

## 2019-04-01 HISTORY — DX: Ischemic cardiomyopathy: I25.5

## 2019-04-01 LAB — BASIC METABOLIC PANEL
Anion gap: 9 (ref 5–15)
BUN: 34 mg/dL — ABNORMAL HIGH (ref 8–23)
CO2: 22 mmol/L (ref 22–32)
Calcium: 8.5 mg/dL — ABNORMAL LOW (ref 8.9–10.3)
Chloride: 101 mmol/L (ref 98–111)
Creatinine, Ser: 2.35 mg/dL — ABNORMAL HIGH (ref 0.61–1.24)
GFR calc Af Amer: 29 mL/min — ABNORMAL LOW (ref >60)
GFR calc non Af Amer: 25 mL/min — ABNORMAL LOW (ref >60)
Glucose, Bld: 141 mg/dL — ABNORMAL HIGH (ref 70–99)
Potassium: 5.3 mmol/L — ABNORMAL HIGH (ref 3.5–5.1)
Sodium: 132 mmol/L — ABNORMAL LOW (ref 135–145)

## 2019-04-01 LAB — COMPREHENSIVE METABOLIC PANEL
ALT: 12 U/L (ref 0–44)
AST: 16 U/L (ref 15–41)
Albumin: 3.1 g/dL — ABNORMAL LOW (ref 3.5–5.0)
Alkaline Phosphatase: 137 U/L — ABNORMAL HIGH (ref 38–126)
Anion gap: 11 (ref 5–15)
BUN: 34 mg/dL — ABNORMAL HIGH (ref 8–23)
CO2: 21 mmol/L — ABNORMAL LOW (ref 22–32)
Calcium: 8.4 mg/dL — ABNORMAL LOW (ref 8.9–10.3)
Chloride: 101 mmol/L (ref 98–111)
Creatinine, Ser: 2.33 mg/dL — ABNORMAL HIGH (ref 0.61–1.24)
GFR calc Af Amer: 29 mL/min — ABNORMAL LOW (ref >60)
GFR calc non Af Amer: 25 mL/min — ABNORMAL LOW (ref >60)
Glucose, Bld: 136 mg/dL — ABNORMAL HIGH (ref 70–99)
Potassium: 4 mmol/L (ref 3.5–5.1)
Sodium: 133 mmol/L — ABNORMAL LOW (ref 135–145)
Total Bilirubin: 0.5 mg/dL (ref 0.3–1.2)
Total Protein: 6.7 g/dL (ref 6.5–8.1)

## 2019-04-01 LAB — CBC WITH DIFFERENTIAL/PLATELET
Abs Immature Granulocytes: 0.04 10*3/uL (ref 0.00–0.07)
Basophils Absolute: 0 10*3/uL (ref 0.0–0.1)
Basophils Relative: 1 %
Eosinophils Absolute: 0.1 10*3/uL (ref 0.0–0.5)
Eosinophils Relative: 2 %
HCT: 30.3 % — ABNORMAL LOW (ref 39.0–52.0)
Hemoglobin: 10 g/dL — ABNORMAL LOW (ref 13.0–17.0)
Immature Granulocytes: 1 %
Lymphocytes Relative: 18 %
Lymphs Abs: 1.4 10*3/uL (ref 0.7–4.0)
MCH: 27.2 pg (ref 26.0–34.0)
MCHC: 33 g/dL (ref 30.0–36.0)
MCV: 82.3 fL (ref 80.0–100.0)
Monocytes Absolute: 0.9 10*3/uL (ref 0.1–1.0)
Monocytes Relative: 12 %
Neutro Abs: 5.1 10*3/uL (ref 1.7–7.7)
Neutrophils Relative %: 66 %
Platelets: 238 10*3/uL (ref 150–400)
RBC: 3.68 MIL/uL — ABNORMAL LOW (ref 4.22–5.81)
RDW: 14.5 % (ref 11.5–15.5)
WBC: 7.7 10*3/uL (ref 4.0–10.5)
nRBC: 0 % (ref 0.0–0.2)

## 2019-04-01 LAB — ECHOCARDIOGRAM COMPLETE
Height: 67 in
Weight: 4927.72 oz

## 2019-04-01 LAB — TROPONIN I
Troponin I: 0.03 ng/mL (ref <0.03)
Troponin I: 14.2 ng/mL (ref <0.03)
Troponin I: 25.21 ng/mL (ref <0.03)
Troponin I: 40.61 ng/mL (ref <0.03)

## 2019-04-01 LAB — LIPID PANEL
Cholesterol: 104 mg/dL (ref 0–200)
HDL: 43 mg/dL (ref >40)
LDL Cholesterol: 55 mg/dL (ref 0–99)
Total CHOL/HDL Ratio: 2.4 RATIO
Triglycerides: 30 mg/dL (ref <150)
VLDL: 6 mg/dL (ref 0–40)

## 2019-04-01 LAB — POCT ACTIVATED CLOTTING TIME
Activated Clotting Time: 323 seconds
Activated Clotting Time: 263 seconds

## 2019-04-01 LAB — PROTIME-INR
INR: 1.1 (ref 0.8–1.2)
Prothrombin Time: 14.3 seconds (ref 11.4–15.2)

## 2019-04-01 LAB — MRSA PCR SCREENING: MRSA by PCR: NEGATIVE

## 2019-04-01 LAB — CBC
HCT: 32 % — ABNORMAL LOW (ref 39.0–52.0)
Hemoglobin: 10.5 g/dL — ABNORMAL LOW (ref 13.0–17.0)
MCH: 27.1 pg (ref 26.0–34.0)
MCHC: 32.8 g/dL (ref 30.0–36.0)
MCV: 82.7 fL (ref 80.0–100.0)
Platelets: 227 10*3/uL (ref 150–400)
RBC: 3.87 MIL/uL — ABNORMAL LOW (ref 4.22–5.81)
RDW: 14.4 % (ref 11.5–15.5)
WBC: 8.2 10*3/uL (ref 4.0–10.5)
nRBC: 0 % (ref 0.0–0.2)

## 2019-04-01 LAB — GLUCOSE, CAPILLARY: Glucose-Capillary: 129 mg/dL — ABNORMAL HIGH (ref 70–99)

## 2019-04-01 SURGERY — CORONARY/GRAFT ACUTE MI REVASCULARIZATION
Anesthesia: Moderate Sedation

## 2019-04-01 MED ORDER — TICAGRELOR 90 MG PO TABS
90.0000 mg | ORAL_TABLET | Freq: Two times a day (BID) | ORAL | Status: DC
  Administered 2019-04-01 – 2019-04-02 (×3): 90 mg via ORAL
  Filled 2019-04-01 (×3): qty 1

## 2019-04-01 MED ORDER — ATORVASTATIN CALCIUM 20 MG PO TABS
40.0000 mg | ORAL_TABLET | Freq: Every day | ORAL | Status: DC
  Administered 2019-04-01 – 2019-04-02 (×2): 40 mg via ORAL
  Filled 2019-04-01 (×2): qty 2

## 2019-04-01 MED ORDER — SODIUM CHLORIDE 0.9 % IV SOLN
INTRAVENOUS | Status: AC
  Administered 2019-04-01: 03:00:00 via INTRAVENOUS

## 2019-04-01 MED ORDER — HYDRALAZINE HCL 20 MG/ML IJ SOLN
10.0000 mg | INTRAMUSCULAR | Status: DC | PRN
  Administered 2019-04-01: 10 mg via INTRAVENOUS
  Filled 2019-04-01: qty 1

## 2019-04-01 MED ORDER — VERAPAMIL HCL 2.5 MG/ML IV SOLN
INTRAVENOUS | Status: AC
  Filled 2019-04-01: qty 2

## 2019-04-01 MED ORDER — TICAGRELOR 90 MG PO TABS
ORAL_TABLET | ORAL | Status: DC | PRN
  Administered 2019-04-01: 180 mg via ORAL

## 2019-04-01 MED ORDER — NITROGLYCERIN 0.4 MG SL SUBL: 0.4000 mg | SUBLINGUAL_TABLET | SUBLINGUAL | Status: DC | PRN

## 2019-04-01 MED ORDER — HEPARIN SODIUM (PORCINE) 1000 UNIT/ML IJ SOLN
INTRAMUSCULAR | Status: AC
  Filled 2019-04-01: qty 1

## 2019-04-01 MED ORDER — TIROFIBAN HCL IN NACL 5-0.9 MG/100ML-% IV SOLN
INTRAVENOUS | Status: DC | PRN
  Administered 2019-04-01: 0.075 ug/kg/min via INTRAVENOUS

## 2019-04-01 MED ORDER — IOHEXOL 300 MG/ML  SOLN
INTRAMUSCULAR | Status: DC | PRN
  Administered 2019-04-01: 02:00:00 100 mL

## 2019-04-01 MED ORDER — HEPARIN SODIUM (PORCINE) 1000 UNIT/ML IJ SOLN
INTRAMUSCULAR | Status: DC | PRN
  Administered 2019-04-01 (×2): 6000 [IU] via INTRAVENOUS

## 2019-04-01 MED ORDER — SODIUM CHLORIDE 0.9 % IV SOLN: 250.0000 mL | INTRAVENOUS | Status: DC | PRN

## 2019-04-01 MED ORDER — MORPHINE SULFATE (PF) 4 MG/ML IV SOLN
INTRAVENOUS | Status: AC
  Administered 2019-04-01: 4 mg
  Filled 2019-04-01: qty 1

## 2019-04-01 MED ORDER — FENTANYL CITRATE (PF) 100 MCG/2ML IJ SOLN
INTRAMUSCULAR | Status: AC
  Filled 2019-04-01: qty 2

## 2019-04-01 MED ORDER — IPRATROPIUM-ALBUTEROL 0.5-2.5 (3) MG/3ML IN SOLN: 3.0000 mL | RESPIRATORY_TRACT | Status: DC | PRN

## 2019-04-01 MED ORDER — CARVEDILOL 25 MG PO TABS
25.0000 mg | ORAL_TABLET | Freq: Two times a day (BID) | ORAL | Status: DC
  Administered 2019-04-01 – 2019-04-02 (×3): 25 mg via ORAL
  Filled 2019-04-01 (×3): qty 1

## 2019-04-01 MED ORDER — ONDANSETRON HCL 4 MG/2ML IJ SOLN
INTRAMUSCULAR | Status: AC
  Administered 2019-04-01: 4 mg
  Filled 2019-04-01: qty 2

## 2019-04-01 MED ORDER — DEXTROSE 50 % IV SOLN
1.0000 | Freq: Once | INTRAVENOUS | Status: AC
  Administered 2019-04-01: 50 mL via INTRAVENOUS
  Filled 2019-04-01: qty 50

## 2019-04-01 MED ORDER — FENTANYL CITRATE (PF) 100 MCG/2ML IJ SOLN
INTRAMUSCULAR | Status: DC | PRN
  Administered 2019-04-01 (×2): 12.5 ug via INTRAVENOUS

## 2019-04-01 MED ORDER — ONDANSETRON HCL 4 MG/2ML IJ SOLN
4.0000 mg | Freq: Four times a day (QID) | INTRAMUSCULAR | Status: DC | PRN
  Administered 2019-04-01 – 2019-04-02 (×2): 4 mg via INTRAVENOUS
  Filled 2019-04-01 (×2): qty 2

## 2019-04-01 MED ORDER — ATROPINE SULFATE 1 MG/10ML IJ SOSY
PREFILLED_SYRINGE | INTRAMUSCULAR | Status: AC
  Filled 2019-04-01: qty 10

## 2019-04-01 MED ORDER — ACETAMINOPHEN 325 MG PO TABS: 650.0000 mg | ORAL_TABLET | ORAL | Status: DC | PRN

## 2019-04-01 MED ORDER — HEPARIN (PORCINE) IN NACL 1000-0.9 UT/500ML-% IV SOLN
INTRAVENOUS | Status: DC | PRN
  Administered 2019-04-01: 1000 mL

## 2019-04-01 MED ORDER — SODIUM CHLORIDE 0.9% FLUSH: 3.0000 mL | INTRAVENOUS | Status: DC | PRN

## 2019-04-01 MED ORDER — LEVOTHYROXINE SODIUM 50 MCG PO TABS
50.0000 ug | ORAL_TABLET | Freq: Every day | ORAL | Status: DC
  Administered 2019-04-01 – 2019-04-02 (×2): 50 ug via ORAL
  Filled 2019-04-01 (×2): qty 1

## 2019-04-01 MED ORDER — ALBUTEROL SULFATE (2.5 MG/3ML) 0.083% IN NEBU: 3.0000 mL | INHALATION_SOLUTION | Freq: Four times a day (QID) | RESPIRATORY_TRACT | Status: DC | PRN

## 2019-04-01 MED ORDER — VERAPAMIL HCL 2.5 MG/ML IV SOLN
INTRAVENOUS | Status: DC | PRN
  Administered 2019-04-01: 01:00:00 via INTRA_ARTERIAL

## 2019-04-01 MED ORDER — TICAGRELOR 90 MG PO TABS
ORAL_TABLET | ORAL | Status: AC
  Filled 2019-04-01: qty 2

## 2019-04-01 MED ORDER — TIROFIBAN HCL IN NACL 5-0.9 MG/100ML-% IV SOLN
INTRAVENOUS | Status: AC
  Filled 2019-04-01: qty 100

## 2019-04-01 MED ORDER — INSULIN ASPART 100 UNIT/ML IV SOLN
5.0000 [IU] | Freq: Once | INTRAVENOUS | Status: AC
  Administered 2019-04-01: 5 [IU] via INTRAVENOUS
  Filled 2019-04-01: qty 0.05

## 2019-04-01 MED ORDER — IOPAMIDOL (ISOVUE-300) INJECTION 61%
INTRAVENOUS | Status: DC | PRN
  Administered 2019-04-01: 150 mL

## 2019-04-01 MED ORDER — MOMETASONE FURO-FORMOTEROL FUM 200-5 MCG/ACT IN AERO
2.0000 | INHALATION_SPRAY | Freq: Two times a day (BID) | RESPIRATORY_TRACT | Status: DC
  Filled 2019-04-01: qty 8.8

## 2019-04-01 MED ORDER — HEPARIN SODIUM (PORCINE) 5000 UNIT/ML IJ SOLN
5000.0000 [IU] | Freq: Three times a day (TID) | INTRAMUSCULAR | Status: DC
  Administered 2019-04-01 – 2019-04-02 (×3): 5000 [IU] via SUBCUTANEOUS
  Filled 2019-04-01 (×3): qty 1

## 2019-04-01 MED ORDER — HEPARIN (PORCINE) IN NACL 1000-0.9 UT/500ML-% IV SOLN
INTRAVENOUS | Status: AC
  Filled 2019-04-01: qty 1000

## 2019-04-01 MED ORDER — ASPIRIN EC 81 MG PO TBEC
81.0000 mg | DELAYED_RELEASE_TABLET | Freq: Every day | ORAL | Status: DC
  Administered 2019-04-01 – 2019-04-02 (×2): 81 mg via ORAL
  Filled 2019-04-01 (×3): qty 1

## 2019-04-01 MED ORDER — TIROFIBAN (AGGRASTAT) BOLUS VIA INFUSION
INTRAVENOUS | Status: DC | PRN
  Administered 2019-04-01: 01:00:00 3412.5 ug via INTRAVENOUS

## 2019-04-01 MED ORDER — SODIUM CHLORIDE 0.9 % IV BOLUS
INTRAVENOUS | Status: DC | PRN
  Administered 2019-04-01: 250 mL via INTRAVENOUS

## 2019-04-01 MED ORDER — SODIUM CHLORIDE 0.9% FLUSH
3.0000 mL | Freq: Two times a day (BID) | INTRAVENOUS | Status: DC
  Administered 2019-04-01 – 2019-04-02 (×2): 3 mL via INTRAVENOUS

## 2019-04-01 SURGICAL SUPPLY — 20 items
BALLN TREK RX 3.0X12 (BALLOONS) ×3
BALLN ~~LOC~~ TREK RX 4.5X12 (BALLOONS) ×3
BALLOON TREK RX 3.0X12 (BALLOONS) IMPLANT
BALLOON ~~LOC~~ TREK RX 4.5X12 (BALLOONS) IMPLANT
CATH INFINITI 5 FR JL3.5 (CATHETERS) ×2 IMPLANT
CATH INFINITI 5FR JL4 (CATHETERS) ×2 IMPLANT
CATH VISTA GUIDE 6FR JR4 (CATHETERS) ×2 IMPLANT
DEVICE INFLAT 30 PLUS (MISCELLANEOUS) ×2 IMPLANT
DEVICE RAD COMP TR BAND LRG (VASCULAR PRODUCTS) ×2 IMPLANT
GLIDESHEATH SLEND SS 6F .021 (SHEATH) ×2 IMPLANT
KIT MANI 3VAL PERCEP (MISCELLANEOUS) ×3 IMPLANT
PACK CARDIAC CATH (CUSTOM PROCEDURE TRAY) ×3 IMPLANT
PROTECTION STATION PRESSURIZED (MISCELLANEOUS) ×3
STATION PROTECTION PRESSURIZED (MISCELLANEOUS) IMPLANT
STENT RESOLUTE ONYX 4.0X15 (Permanent Stent) ×2 IMPLANT
STENT RESOLUTE ONYX 4.0X26 (Permanent Stent) ×2 IMPLANT
WIRE ASAHI PROWATER 180CM (WIRE) ×4 IMPLANT
WIRE HITORQ VERSACORE ST 145CM (WIRE) ×2 IMPLANT
WIRE ROSEN-J .035X260CM (WIRE) ×2 IMPLANT
WIRE RUNTHROUGH .014X180CM (WIRE) ×2 IMPLANT

## 2019-04-01 NOTE — H&P (Signed)
[facility] [place] NAME: [name]    MR#:  [id number]  DATE OF BIRTH:  Dec 12, 1935  DATE OF ADMISSION:  04/01/2019  PRIMARY CARE PHYSICIAN: Marguerita Merles, MD   REQUESTING/REFERRING PHYSICIAN: End, Harrell Gave, MD CHIEF COMPLAINT:   Chief Complaint  Patient presents with  . Chest Pain    HISTORY OF PRESENT ILLNESS:  [name]  is a 84 y.o. male with a known history of multi-medical problems that will be mentioned below including coronary artery disease status post PCI and DES stent in the distal RCA, who presented to the emergency room with acute onset of midsternal chest pain graded 10/10 in severity and described as tightness and pressure with radiation to his left arm and associated nausea without vomiting as well as dyspnea and significant diaphoresis.  He admitted to occasional headache yesterday without dizziness or blurred vision or paresthesias or focal muscle weakness.  He denied any leg pain or worsening edema or recent travels or surgeries.  No recent sick exposure or travel out of the state or out of the country or exposure to large crowds.  No fever or chills.  Upon presentation to the emergency room, his vital signs were within normal except for a pulse of 37.  His labs were remarkable for a troponin I of 0.03, mild anemia and a BUN of 34 with creatinine of 2.33.  EKG showed sinus bradycardia with a rate of 57 with T wave inversion laterally as well as in V1 and V2 with ST segment elevation inferiorly and in V3.  The patient was given 4 mg of IV Zofran 4 mg IV morphine sulfate as well as IV heparin.  Code STEMI was called and the patient was taken to the Cath Lab by Dr. and, and underwent PCI with 2 drug-eluting stents to his mid to proximal RCA for obvious occlusions.  He was placed on IV Aggrastat infusion and was placed on aspirin and Lipitor as well as Coreg.  He was subsequently admitted to the ICU. PAST MEDICAL HISTORY:    Past Medical History:  Diagnosis Date  . Alcoholism ([place])   . Asthma   . Bipolar affective disorder ([place])   . Chronic combined systolic and diastolic CHF (congestive heart failure) ([place])    a. 08/2016 Echo: EF 40-45%, mild AS, mild to mod MR, mildly dil LA/RA, mild-mod TR; b. 07/2017 Echo: EF 40-45%, mod LVH, Gr1 DD, mild to mod AS, mildly dil LA, nl RV fxn.  . CKD (chronic kidney disease), stage III ([place])   . COPD (chronic obstructive pulmonary disease) (H[facility])    COPD  . Coronary artery disease    a. 01/2004 s/p PCI and Taxus drug-eluting stent placement to the distal RCA (3.5 x 12 mm); b. 07/2017 Lexiscan MV: no ischemia. Sm area of apicl thinning, likely attenuation. EF 33% (GI uptake noted)-->Low risk.  . Degenerative joint disease   . Essential hypertension   . Hyperlipidemia   . Hypertension   . Prostate CA [facility])    prostate ca dx 20 yrs ago  . PVD (peripheral vascular disease) ([place])   . Tobacco abuse     PAST SURGICAL HISTORY:   Past Surgical History:  Procedure Laterality Date  . CARDIAC CATHETERIZATION    . CORONARY ANGIOPLASTY WITH STENT PLACEMENT    . TOTAL HIP ARTHROPLASTY     right    SOCIAL HISTORY:   Social History   Tobacco Use  . Smoking  status: Former Smoker    Packs/day: 1.00    Years: 50.00    Pack years: 50.00    Types: Cigarettes  . Smokeless tobacco: Never Used  Substance Use Topics  . Alcohol use: Yes    Comment: social     FAMILY HISTORY:   Family History  Problem Relation Age of Onset  . Hypertension Mother   . Hyperlipidemia Mother   . Heart attack Mother   . Hypertension Father   . Hyperlipidemia Father   . Heart attack Father   . Prostate cancer Neg Hx   . Bladder Cancer Neg Hx   . Kidney cancer Neg Hx     DRUG ALLERGIES:   Allergies  Allergen Reactions  . Amlodipine Other (See Comments)    Constipation  Constipation Constipation  . Benazepril-Hydrochlorothiazide Other (See Comments)    Constipation  Constipation  Constipation  . Chlorthalidone Other (See Comments)    Hyponatremia Hyponatremia  . Lisinopril Other (See Comments)  . Other Other (See Comments)    "ANY BLOOD PRESSURE MEDICATIONS THAT I'VE TRIED" - PT. DOES NOT REMEMBER WHICH ONES "ANY BLOOD PRESSURE MEDICATIONS THAT I'VE TRIED"- PT. DOES NOT REMEMBER WHICH ONES- CONSTIPATION    REVIEW OF SYSTEMS:   ROS As per history of present illness. All pertinent systems were reviewed above. Constitutional,  HEENT, cardiovascular, respiratory, GI, GU, musculoskeletal, neuro, psychiatric, endocrine,  integumentary and hematologic systems were reviewed and are otherwise  negative/unremarkable except for positive findings mentioned above in the HPI.   MEDICATIONS AT HOME:   Prior to Admission medications   Medication Sig Start Date End Date Taking? Authorizing Provider  albuterol (PROAIR HFA) 108 (90 Base) MCG/ACT inhaler Inhale 2 puffs into the lungs every 6 (six) hours as needed for wheezing or shortness of breath. Reported on 04/21/2016 05/31/17 10/05/18  Loletha Grayer, MD  amLODipine (NORVASC) 5 MG tablet  05/06/18   [provider]  aspirin EC 81 MG tablet Take 81 mg by mouth daily.    [provider]  atorvastatin (LIPITOR) 40 MG tablet Take 40 mg by mouth daily.    [provider]  carvedilol (COREG) 25 MG tablet Take 25 mg by mouth 2 (two) times daily with a meal.    [provider]  fluticasone-salmeterol (ADVAIR HFA) 115-21 MCG/ACT inhaler Inhale 2 puffs into the lungs 2 (two) times daily.    [provider]  furosemide (LASIX) 20 MG tablet Take 20 mg by mouth 2 (two) times daily.    [provider]  ipratropium-albuterol (DUONEB) 0.5-2.5 (3) MG/3ML SOLN 3 mLs every 4 (four) hours as needed.  08/02/18   [provider]  levothyroxine (SYNTHROID, LEVOTHROID) 50 MCG tablet Take 50 mcg by mouth daily before breakfast.    [provider]  magnesium oxide (MAG-OX) 400 MG  tablet Take 400 mg by mouth 2 (two) times daily.    [provider]  nitroGLYCERIN (NITROSTAT) 0.4 MG SL tablet Take 0.4 mg by mouth every 5 (five) minutes x 3 doses as needed for chest pain. As needed for chest pain 12/06/15   [provider]      VITAL SIGNS:  Blood pressure (!) 146/120, pulse 64, temperature 98.3 F (36.8 C), temperature source Oral, resp. rate 18, height 5\' 7"  (1.702 m), weight (!) 139.7 kg, SpO2 99 %.  PHYSICAL EXAMINATION:  Physical Exam  GENERAL:  84 y.o.-year-old African-American patient lying in the bed with no acute distress.  EYES: Pupils equal, round, reactive to light  and accommodation. No scleral icterus. Extraocular muscles intact.  HEENT: Head atraumatic, normocephalic. Oropharynx and nasopharynx clear.  NECK:  Supple, no jugular venous distention. No thyroid enlargement, no tenderness.  LUNGS: Normal breath sounds bilaterally, no wheezing, rales,rhonchi or crepitation. No use of accessory muscles of respiration.  CARDIOVASCULAR: Regular rate and rhythm, S1, S2 normal. No murmurs, rubs, or gallops.  ABDOMEN: Soft, nondistended, nontender. Bowel sounds present. No organomegaly or mass.  EXTREMITIES: Trace to 1+ pedal and leg pitting edema, with no cyanosis, or clubbing.  NEUROLOGIC: Cranial nerves II through XII are intact. Muscle strength 5/5 in all extremities. Sensation intact. Gait not checked.  PSYCHIATRIC: The patient is alert and oriented x 3.  Normal affect and good eye contact. SKIN: No obvious rash, lesion, or ulcer.   LABORATORY PANEL:   CBC Recent Labs  Lab 04/01/19 0005  WBC 7.7  HGB 10.0*  HCT 30.3*  PLT 238   ------------------------------------------------------------------------------------------------------------------  Chemistries  Recent Labs  Lab 04/01/19 0005  NA 133*  K 4.0  CL 101  CO2 21*  GLUCOSE 136*  BUN 34*  CREATININE 2.33*  CALCIUM 8.4*  AST 16  ALT 12  ALKPHOS 137*  BILITOT 0.5    ------------------------------------------------------------------------------------------------------------------  Cardiac Enzymes Recent Labs  Lab 04/01/19 0005  TROPONINI 0.03*   ------------------------------------------------------------------------------------------------------------------  RADIOLOGY:  Dg Chest Port 1 View  Result Date: 04/01/2019 CLINICAL DATA:  Chest pain EXAM: PORTABLE CHEST 1 VIEW COMPARISON:  08/05/2017 FINDINGS: Cardiac shadow is stable. Aortic calcifications are again seen. The lungs are hyperinflated bilaterally. No focal infiltrate or sizable effusion is seen. Very mild vascular congestion is noted centrally. IMPRESSION: Mild vascular congestion.  No focal infiltrate is seen. Electronically Signed   By: Inez Catalina M.D.   On: 04/01/2019 00:43      IMPRESSION AND PLAN:   #1.  Inferior STEMI status post proximal and mid RCA DE stents for 80 to 95% lesions.  He has nonobstructive coronary artery disease in the left coronary artery and patent distal RCA Taxus stent with mild in-stent stenosis.  He underwent a 2D echo that revealed EF of 40 to 45% with grade 1 diastolic dysfunction, mild left atrial and right atrial dilatation.  The patient is admitted to an ICU bed.  He will have serial cardiac enzymes.  He was placed on high-dose Lipitor as well as Coreg.  He will be continued on aspirin as well as Ticagrelor for at least 12 months.  2.  Dyslipidemia.  Management as above.  Fasting lipids will be obtained.  3.  COPD.  This is currently stable.  I will hold off his Dulera due to long-acting beta agonist contraindication in the setting of acute MI.  4.  Stage III chronic kidney disease.  He is being hydrated with IV normal saline after 250 mL of IV contrast dye was given during his cath and PCI.  We will monitor his renal functions.  5.  Hypertension.  Will be placed on Coreg and amlodipine was held off.  He will benefit from BiDil given his chronic systolic  CHF.  6.  Chronic systolic CHF.  A 2D echo was obtained as mentioned above.  He will be placed on Coreg and given his advanced kidney functions and prior intolerance ACE inhibitors/ARB and aldosterone antagonist addition was held off.  7.  DVT prophylaxis.  Subcutaneous heparin.   All the records are reviewed and case discussed with ED provider. The plan of care was discussed in details with the patient (  and family). I answered all questions. The patient agreed to proceed with the above mentioned plan. Further management will depend upon hospital course.   CODE STATUS: Full code  TOTAL TIME TAKING CARE OF THIS PATIENT: 55 minutes.    Christel Mormon M.D on 04/01/2019 at 4:46 AM  Pager - [phone]  After 6pm go to [email or web address] - Proofreader  [facility]  Office  [phone]  CC: Primary care physician; Marguerita Merles, MD   Note: This dictation was prepared with Dragon dictation along with smaller phrase technology. Any transcriptional errors that result from this process are unintentional.

## 2019-04-01 NOTE — Progress Notes (Signed)
CRITICAL VALUE ALERT  Critical Value:  Troponin 40.61  Date & Time Notied:  3085 04/01/19  Provider Notified: End, Md (Cardiology)  Orders Received/Actions taken: none at this time

## 2019-04-01 NOTE — ED Notes (Signed)
X-Ray in the room.

## 2019-04-01 NOTE — ED Notes (Signed)
Dr. Saunders Revel is at bedside to complete his assessment and take the Pt to the cath lab.

## 2019-04-01 NOTE — Progress Notes (Signed)
*  PRELIMINARY RESULTS* Echocardiogram 2D Echocardiogram has been performed.  [name] 04/01/2019, 11:16 AM

## 2019-04-01 NOTE — ED Triage Notes (Signed)
Pt arrived to the ED via EMS from home for complaints of chest pain that woke him up from his sleep. EMS called a code STEMI for the Pt, Dr. Beather Arbour at bedside upon Pt's arrival. Pt is AOx4 in moderate pain.

## 2019-04-01 NOTE — ED Notes (Signed)
Report was given to Alli RN at bedside on the cath lab.

## 2019-04-01 NOTE — ED Provider Notes (Signed)
[facility] Emergency Department Provider Note   ____________________________________________   First MD Initiated Contact with Patient 04/01/19 0009     (approximate)  I have reviewed the triage vital signs and the nursing notes.   HISTORY  Chief Complaint Chest Pain    HPI [name] is a 84 y.o. male brought to the ED from home via EMS with a chief complaint of chest pain.  Patient has a history of CAD status post stents, CHF, CKD, history of alcoholism who awoke approximately 1 hour prior to arrival with central chest pressure.  Took 1 nitroglycerin prior to EMS arrival.  EMS reports patient was diaphoretic upon their arrival with blood pressure 98/61.  EMS gave 4 baby aspirin.  Patient denies recent fever, cough, shortness of breath, abdominal pain, nausea or vomiting.  Denies recent travel or trauma.  Denies exposure to persons diagnosed with coronavirus.       Past Medical History:  Diagnosis Date  . Alcoholism ([place])   . Asthma   . Bipolar affective disorder ([place])   . Chronic combined systolic and diastolic CHF (congestive heart failure) ([place])    a. 08/2016 Echo: EF 40-45%, mild AS, mild to mod MR, mildly dil LA/RA, mild-mod TR; b. 07/2017 Echo: EF 40-45%, mod LVH, Gr1 DD, mild to mod AS, mildly dil LA, nl RV fxn.  . CKD (chronic kidney disease), stage III ([place])   . COPD (chronic obstructive pulmonary disease) (H[facility])    COPD  . Coronary artery disease    a. 01/2004 s/p PCI and Taxus drug-eluting stent placement to the distal RCA (3.5 x 12 mm); b. 07/2017 Lexiscan MV: no ischemia. Sm area of apicl thinning, likely attenuation. EF 33% (GI uptake noted)-->Low risk.  . Degenerative joint disease   . Essential hypertension   . Hyperlipidemia   . Hypertension   . Prostate CA [facility])    prostate ca dx 20 yrs ago  . PVD (peripheral vascular disease) ([place])   . Tobacco abuse     Patient Active Problem List   Diagnosis Date Noted  . Lower extremity  pain, bilateral 05/15/2018  . Elevated troponin I level 07/31/2017  . Hyperkalemia 05/28/2017  . Syncope 05/27/2017  . Meningioma ([place]) 01/05/2017  . GI bleed 12/01/2016  . Alcohol abuse 10/30/2016  . Chronic systolic heart failure ([place]) 10/04/2016  . COPD (chronic obstructive pulmonary disease) with chronic bronchitis ([place]) 10/04/2016  . Obstructive sleep apnea 10/04/2016  . Hyponatremia 08/31/2016  . UTI (lower urinary tract infection) 08/31/2016  . Exertional dyspnea 02/11/2016  . Hyperlipidemia 12/03/2015  . Essential hypertension   . Coronary artery disease 01/19/2004    Past Surgical History:  Procedure Laterality Date  . CARDIAC CATHETERIZATION    . CORONARY ANGIOPLASTY WITH STENT PLACEMENT    . TOTAL HIP ARTHROPLASTY     right    Prior to Admission medications   Medication Sig Start Date End Date Taking? Authorizing Provider  albuterol (PROAIR HFA) 108 (90 Base) MCG/ACT inhaler Inhale 2 puffs into the lungs every 6 (six) hours as needed for wheezing or shortness of breath. Reported on 04/21/2016 05/31/17 10/05/18  Loletha Grayer, MD  amLODipine (NORVASC) 5 MG tablet  05/06/18   [provider]  aspirin EC 81 MG tablet Take 81 mg by mouth daily.    [provider]  atorvastatin (LIPITOR) 40 MG tablet Take 40 mg by mouth daily.    [provider]  carvedilol (COREG) 25 MG tablet Take 25  mg by mouth 2 (two) times daily with a meal.    [provider]  fluticasone-salmeterol (ADVAIR HFA) 115-21 MCG/ACT inhaler Inhale 2 puffs into the lungs 2 (two) times daily.    [provider]  furosemide (LASIX) 20 MG tablet Take 20 mg by mouth 2 (two) times daily.    [provider]  ipratropium-albuterol (DUONEB) 0.5-2.5 (3) MG/3ML SOLN 3 mLs every 4 (four) hours as needed.  08/02/18   [provider]  levothyroxine (SYNTHROID, LEVOTHROID) 50 MCG tablet Take 50 mcg by mouth daily before breakfast.    [provider]   magnesium oxide (MAG-OX) 400 MG tablet Take 400 mg by mouth 2 (two) times daily.    [provider]  nitroGLYCERIN (NITROSTAT) 0.4 MG SL tablet Take 0.4 mg by mouth every 5 (five) minutes x 3 doses as needed for chest pain. As needed for chest pain 12/06/15   [provider]    Allergies Amlodipine; Benazepril-hydrochlorothiazide; Chlorthalidone; Lisinopril; and Other  Family History  Problem Relation Age of Onset  . Hypertension Mother   . Hyperlipidemia Mother   . Heart attack Mother   . Hypertension Father   . Hyperlipidemia Father   . Heart attack Father   . Prostate cancer Neg Hx   . Bladder Cancer Neg Hx   . Kidney cancer Neg Hx     Social History Social History   Tobacco Use  . Smoking status: Former Smoker    Packs/day: 1.00    Years: 50.00    Pack years: 50.00    Types: Cigarettes  . Smokeless tobacco: Never Used  Substance Use Topics  . Alcohol use: Yes    Comment: social   . Drug use: No    Review of Systems  Constitutional: No fever/chills Eyes: No visual changes. ENT: No sore throat. Cardiovascular: Positive for chest pain. Respiratory: Denies shortness of breath. Gastrointestinal: No abdominal pain.  No nausea, no vomiting.  No diarrhea.  No constipation. Genitourinary: Negative for dysuria. Musculoskeletal: Negative for back pain. Skin: Negative for rash. Neurological: Negative for headaches, focal weakness or numbness.   ____________________________________________   PHYSICAL EXAM:  VITAL SIGNS: ED Triage Vitals [04/01/19 0008]  Enc Vitals Group     BP 123/63     Pulse Rate (!) 57     Resp 17     Temp 98.6 F (37 C)     Temp Source Oral     SpO2 96 %     Weight      Height      Head Circumference      Peak Flow      Pain Score      Pain Loc      Pain Edu?      Excl. in [place]?     Constitutional: Alert and oriented. Well appearing and in moderate acute distress. Eyes: Conjunctivae are normal. PERRL. EOMI.  Head: Atraumatic. Nose: No congestion/rhinnorhea. Mouth/Throat: Mucous membranes are moist.  Oropharynx non-erythematous. Neck: No stridor.   Cardiovascular: Normal rate, regular rhythm. Grossly normal heart sounds.  Good peripheral circulation. Respiratory: Normal respiratory effort.  No retractions. Lungs CTAB. Gastrointestinal: Soft and nontender. No distention. No abdominal bruits. No CVA tenderness. Musculoskeletal: No lower extremity tenderness nor edema.  No joint effusions. Neurologic:  Normal speech and language. No gross focal neurologic deficits are appreciated.  Skin:  Skin is warm, dry and intact. No rash noted. Psychiatric: Mood and affect are normal. Speech and behavior are normal.  ____________________________________________  LABS (all labs ordered are listed, but only abnormal results are displayed)  Labs Reviewed  CBC WITH DIFFERENTIAL/PLATELET - Abnormal; Notable for the following components:      Result Value   RBC 3.68 (*)    Hemoglobin 10.0 (*)    HCT 30.3 (*)    All other components within normal limits  PROTIME-INR  COMPREHENSIVE METABOLIC PANEL  TROPONIN I   ____________________________________________  EKG  ED ECG REPORT I, Bowie Delia J, the attending physician, personally viewed and interpreted this ECG.   Date: 04/01/2019  EKG Time: 0006  Rate: 57  Rhythm: normal EKG, normal sinus rhythm  Axis: Normal  Intervals:none  ST&T Change: ST elevation in inferior leads  ____________________________________________  RADIOLOGY  ED MD interpretation: Mild vascular congestion  Official radiology report(s): Dg Chest Port 1 View  Result Date: 04/01/2019 CLINICAL DATA:  Chest pain EXAM: PORTABLE CHEST 1 VIEW COMPARISON:  08/05/2017 FINDINGS: Cardiac shadow is stable. Aortic calcifications are again seen. The lungs are hyperinflated bilaterally. No focal infiltrate or sizable effusion is seen. Very mild vascular congestion is noted centrally.  IMPRESSION: Mild vascular congestion.  No focal infiltrate is seen. Electronically Signed   By: Inez Catalina M.D.   On: 04/01/2019 00:43    ____________________________________________   PROCEDURES  Procedure(s) performed (including Critical Care):  Procedures  CRITICAL CARE Performed by: Paulette Blanch   Total critical care time: 30 minutes  Critical care time was exclusive of separately billable procedures and treating other patients.  Critical care was necessary to treat or prevent imminent or life-threatening deterioration.  Critical care was time spent personally by me on the following activities: development of treatment plan with patient and/or surrogate as well as nursing, discussions with consultants, evaluation of patient's response to treatment, examination of patient, obtaining history from patient or surrogate, ordering and performing treatments and interventions, ordering and review of laboratory studies, ordering and review of radiographic studies, pulse oximetry and re-evaluation of patient's condition. ____________________________________________   INITIAL IMPRESSION / ASSESSMENT AND PLAN / ED COURSE  As part of my medical decision making, I reviewed the following data within the [place] notes reviewed and incorporated, Labs reviewed, EKG interpreted, Old chart reviewed, Radiograph reviewed, Discussed with admitting physician Dr. Marcille Blanco, A consult was requested and obtained from this/these consultant(s) Cardiology and Notes from prior ED visits        84 year old male with CAD, CHF, CKD who presents with active chest pain. Differential diagnosis includes, but is not limited to, ACS, aortic dissection, pulmonary embolism, cardiac tamponade, pneumothorax, pneumonia, pericarditis, myocarditis, GI-related causes including esophagitis/gastritis, and musculoskeletal chest wall pain.    LEITH SZAFRANSKI was evaluated in Emergency Department on  04/01/2019 for the symptoms described in the history of present illness. He was evaluated in the context of the global COVID-19 pandemic, which necessitated consideration that the patient might be at risk for infection with the SARS-CoV-2 virus that causes COVID-19. Institutional protocols and algorithms that pertain to the evaluation of patients at risk for COVID-19 are in a state of rapid change based on information released by regulatory bodies including the CDC and federal and state organizations. These policies and algorithms were followed during the patient's care in the ED.   Clinical Course as of Mar 31 45  Tue Apr 01, 2019  0016 EMS activated code STEMI in the field.  Patient given 4 mg IV Morphine paired with 4 mg IV Zofran for chest pain.   [JS]  6553 Patient  was taken to the cardiac cath lab by Dr. Saunders Revel.   [JS]    Clinical Course User Index [JS] Paulette Blanch, MD     ____________________________________________   FINAL CLINICAL IMPRESSION(S) / ED DIAGNOSES  Final diagnoses:  ST elevation myocardial infarction (STEMI), unspecified artery [facility])     ED Discharge Orders    None       Note:  This document was prepared using Dragon voice recognition software and may include unintentional dictation errors.   Paulette Blanch, MD 04/01/19 [phone]

## 2019-04-01 NOTE — Progress Notes (Signed)
[place] [facility] [place] NAME: [name]    MR#:  [id number]  DATE OF BIRTH:  03/23/1935  SUBJECTIVE:  Denies chest pain or shortness of breath  REVIEW OF SYSTEMS:    Review of Systems  Constitutional: Negative for fever, chills weight loss HENT: Negative for ear pain, nosebleeds, congestion, facial swelling, rhinorrhea, neck pain, neck stiffness and ear discharge.   Respiratory: Negative for cough, shortness of breath, wheezing  Cardiovascular: Negative for chest pain, palpitations and leg swelling.  Gastrointestinal: Negative for heartburn, abdominal pain, vomiting, diarrhea or consitpation Genitourinary: Negative for dysuria, urgency, frequency, hematuria Musculoskeletal: Negative for back pain or joint pain Neurological: Negative for dizziness, seizures, syncope, focal weakness,  numbness and headaches.  Hematological: Does not bruise/bleed easily.  Psychiatric/Behavioral: Negative for hallucinations, confusion, dysphoric mood    Tolerating Diet: yes      DRUG ALLERGIES:   Allergies  Allergen Reactions  . Amlodipine Other (See Comments)    Constipation  Constipation Constipation  . Benazepril-Hydrochlorothiazide Other (See Comments)    Constipation  Constipation Constipation  . Chlorthalidone Other (See Comments)    Hyponatremia Hyponatremia  . Lisinopril Other (See Comments)  . Other Other (See Comments)    "ANY BLOOD PRESSURE MEDICATIONS THAT I'VE TRIED" - PT. DOES NOT REMEMBER WHICH ONES "ANY BLOOD PRESSURE MEDICATIONS THAT I'VE TRIED"- PT. DOES NOT REMEMBER WHICH ONES- CONSTIPATION    VITALS:  Blood pressure 136/79, pulse (!) 56, temperature 98.5 F (36.9 C), temperature source Oral, resp. rate 15, height 5\' 7"  (1.702 m), weight (!) 139.7 kg, SpO2 96 %.  PHYSICAL EXAMINATION:  Constitutional: Appears  Obese No distress. HENT: Normocephalic. Marland Kitchen Oropharynx is clear and moist.  Eyes: Conjunctivae and EOM are  normal. PERRLA, no scleral icterus.  Neck: Normal ROM. Neck supple. No JVD. No tracheal deviation. CVS: RRR, S1/S2 +, no murmurs, no gallops, no carotid bruit.  Pulmonary: Effort and breath sounds normal, no stridor, rhonchi, wheezes, rales.  Abdominal: Soft. BS +,  no distension, tenderness, rebound or guarding.  Musculoskeletal: Normal range of motion. No edema and no tenderness.  Neuro: Alert. CN 2-12 grossly intact. No focal deficits. Skin: Skin is warm and dry. No rash noted. Psychiatric: Normal mood and affect.      LABORATORY PANEL:   CBC Recent Labs  Lab 04/01/19 0431  WBC 8.2  HGB 10.5*  HCT 32.0*  PLT 227   ------------------------------------------------------------------------------------------------------------------  Chemistries  Recent Labs  Lab 04/01/19 0005 04/01/19 0431  NA 133* 132*  K 4.0 5.3*  CL 101 101  CO2 21* 22  GLUCOSE 136* 141*  BUN 34* 34*  CREATININE 2.33* 2.35*  CALCIUM 8.4* 8.5*  AST 16  --   ALT 12  --   ALKPHOS 137*  --   BILITOT 0.5  --    ------------------------------------------------------------------------------------------------------------------  Cardiac Enzymes Recent Labs  Lab 04/01/19 0005 04/01/19 0431  TROPONINI 0.03* 14.20*   ------------------------------------------------------------------------------------------------------------------  RADIOLOGY:  Dg Chest Port 1 View  Result Date: 04/01/2019 CLINICAL DATA:  Chest pain EXAM: PORTABLE CHEST 1 VIEW COMPARISON:  08/05/2017 FINDINGS: Cardiac shadow is stable. Aortic calcifications are again seen. The lungs are hyperinflated bilaterally. No focal infiltrate or sizable effusion is seen. Very mild vascular congestion is noted centrally. IMPRESSION: Mild vascular congestion.  No focal infiltrate is seen. Electronically Signed   By: Inez Catalina M.D.   On: 04/01/2019 00:43     ASSESSMENT AND PLAN:   84 year old male with history of hypertension,  kidney disease  with baseline creatinine of 2, COPD and chronic combined systolic and diastolic heart failure who presented to the emergency room due to chest pain.  1.  Inferior ST elevation MI: Patient was taken to cardiac catheterization lab where he underwent PCI. He will continue aspirin, statin, Brilinta and Coreg.. ACE inhibitor/ARB not recommended at this time due to acute kidney injury. Cardiac rehab at discharge.   2.  Acute on chronic kidney injury: Continue IV hydration and follow creatinine  3.  Morbid obesity: Encouraged weight loss as tolerated  4.  Chronic systolic and diastolic heart failure ejection fraction 40 to 45% by last echo: Follow-up on today's echo Patient appears euvolemic at this time.  5.  Essential hypertension: Continue Coreg        Management plans discussed with the patient and he is in agreement.  CODE STATUS: full  TOTAL TIME TAKING CARE OF THIS PATIENT: 25 minutes.     POSSIBLE D/C 1-2 days, DEPENDING ON CLINICAL CONDITION.   Bettey Costa M.D on 04/01/2019 at 12:07 PM  Between 7am to 6pm - Pager - [phone] After 6pm go to [email or web address] - password [email or web address] [facility]  Office  [phone]  CC: Primary care physician; Marguerita Merles, MD  Note: This dictation was prepared with Dragon dictation along with smaller phrase technology. Any transcriptional errors that result from this process are unintentional.

## 2019-04-01 NOTE — Progress Notes (Signed)
eLink Physician-Brief Progress Note Patient Name: [name] DOB: May 30, 1935 MRN: [id number]   Date of Service  04/01/2019  HPI/Events of Note  84 yr old male underwent PCI 2 DES stent to RCA for Inf wall STEMI. Notes, labs, meds reviewed. COPD, Hypothyroidism. HTN. CKD 3, anemia from CKD  Camera: VS stable.  eICU Interventions  - continue cardiac care - stable overall.  - VTE: sq heparin. On coreg/brilinta/asa.      Intervention Category Evaluation Type: New Patient Evaluation  Elmer Sow 04/01/2019, 3:05 AM

## 2019-04-01 NOTE — Progress Notes (Addendum)
   Progress Note  Patient Name: [name] Date of Encounter: 04/01/2019  Primary Cardiologist: Kathlyn Sacramento, MD   Subjective: Feels much better.  Notes fleeting soreness in chest (<1/10).  Not positional or related to deep breathing.  No shortness of breath.  Knee pain has resolved.  Objective: Temp:  [97.9 F (36.6 C)-98.6 F (37 C)] 98.3 F (36.8 C) (04/14 0400) Pulse Rate:  [44-64] 62 (04/14 0630) Resp:  [11-24] 21 (04/14 0630) BP: (118-170)/(63-120) 163/79 (04/14 0630) SpO2:  [90 %-100 %] 99 % (04/14 0630) Weight:  [136.5 kg-139.7 kg] 139.7 kg (04/14 0251) Gen: NAD Heart: RRR with occasional extrasystoles.  1/6 systolic murmur. Lungs: Bilateral rhonchi.  No wheezes. Ext: Right radial arteriotomy without hematoma.  2+ right radial pulse.  Trace ankle edema bilaterally. Skin: No rash on torso.  Tele: NSR and sinus bradycardia with occasional PAC's and PVC's.  Assessment/Plan: 84 y/o man with h/o CAD, chronic systolic HF, and CKD stage III, admitted with inferior STEMI s/p primary PCI overnight.  He is doing well without further angina.  Fleeting chest pain is non-specific.  EKG shows resolution of inferior ST elevation.  TnI still uptrending.  Creatinine stable with borderline hyperkalemia.  Plan to monitor in ICU today and hopefully transfer to telemetry tomorrow.  Echocardiogram to reassess LVEF and aortic stenosis is pending.  We will complete 12 hours of gentle IVF hydration, given contrast load in the setting of CKD.  We will continue current medications, including ASA, ticagrelor, atorvastatin, and carvedilol.  BP is trending up.  Given renal insufficiency, ACEI/ARB is not ideal.  Bidil may be a good option of LVEF is <40%.  No evidence of acute radiation injury following prolonged fluoro/high dose radiation due to challenging PCI and morbid obesity.  Continue ongoing surveillance and patient counseling.   For questions or updates, please contact [place] Please  consult [email or web address] for contact info under     Signed, Nelva Bush, MD  04/01/2019, 10:35 AM

## 2019-04-01 NOTE — Consult Note (Signed)
Cardiology Consultation:   Patient ID: [name] MRN: [id number]; DOB: 1935/10/15  Admit date: 04/01/2019 Date of Consult: 04/01/2019  Primary Care Provider: Marguerita Merles, MD Primary Cardiologist: Kathlyn Sacramento, MD  Primary Electrophysiologist:  None    Patient Profile:   [name] is a 84 y.o. male with a hx of CAD s/p Taxus DES to distal RCA in 4128, chronic systolic/diastolic HF (LVEF 78-67% by echo in 2018), mild to moderate aortic stenosis, PAD, HTN, HLD, CKD stage III, COPD, OSA, and morbid obesity who is being seen today for the evaluation of chest pain and abnormal EKG at the request of Dr. Beather Arbour.  History of Present Illness:   [name] reports acute onset of substernal chest pain radiating to the neck this evening (he is unable to say exactly what time).  Pain has been severe (10/10) with mild associated shortness of breath.  He took SL NTG x 1 at home without significant improvement and decided to call 911.  EMS found him to have inferior ST segment elevation, prompting STEMI alert.  In the ED, he continued to complain of 5/10 chest pressure after receiving ASA and IV morphine.  He was therefore referred for emergent LHC and possible PCI.  Catheterization showed thrombotic 80-95% lesions in the proximal and mid RCA, which were treated with non-overlapping drug-eluting stents.  The patient is currently chest pain free, complaining only of knee pain which is chronic but was worsened by having to lie still during the catheterization.  He has not had any palpitations, lightheadedness, or edema.  He notes that he has not been using CPAP regularly at home.  Past Medical History:  Diagnosis Date  . Alcoholism ([place])   . Asthma   . Bipolar affective disorder ([place])   . Chronic combined systolic and diastolic CHF (congestive heart failure) ([place])    a. 08/2016 Echo: EF 40-45%, mild AS, mild to mod MR, mildly dil LA/RA, mild-mod TR; b. 07/2017 Echo: EF 40-45%, mod LVH, Gr1 DD, mild to  mod AS, mildly dil LA, nl RV fxn.  . CKD (chronic kidney disease), stage III ([place])   . COPD (chronic obstructive pulmonary disease) ([facility])    COPD  . Coronary artery disease    a. 01/2004 s/p PCI and Taxus drug-eluting stent placement to the distal RCA (3.5 x 12 mm); b. 07/2017 Lexiscan MV: no ischemia. Sm area of apicl thinning, likely attenuation. EF 33% (GI uptake noted)-->Low risk.  . Degenerative joint disease   . Essential hypertension   . Hyperlipidemia   . Hypertension   . Prostate CA [facility])    prostate ca dx 20 yrs ago  . PVD (peripheral vascular disease) ([place])   . Tobacco abuse     Past Surgical History:  Procedure Laterality Date  . CARDIAC CATHETERIZATION    . CORONARY ANGIOPLASTY WITH STENT PLACEMENT    . TOTAL HIP ARTHROPLASTY     right     Home Medications:  Prior to Admission medications   Medication Sig Start Date Sadiq Mccauley Date Taking? Authorizing Provider  albuterol (PROAIR HFA) 108 (90 Base) MCG/ACT inhaler Inhale 2 puffs into the lungs every 6 (six) hours as needed for wheezing or shortness of breath. Reported on 04/21/2016 05/31/17 10/05/18  Loletha Grayer, MD  amLODipine (NORVASC) 5 MG tablet  05/06/18   [provider]  aspirin EC 81 MG tablet Take 81 mg by mouth daily.    [provider]  atorvastatin (LIPITOR) 40 MG tablet Take 40  mg by mouth daily.    [provider]  carvedilol (COREG) 25 MG tablet Take 25 mg by mouth 2 (two) times daily with a meal.    [provider]  fluticasone-salmeterol (ADVAIR HFA) 115-21 MCG/ACT inhaler Inhale 2 puffs into the lungs 2 (two) times daily.    [provider]  furosemide (LASIX) 20 MG tablet Take 20 mg by mouth 2 (two) times daily.    [provider]  ipratropium-albuterol (DUONEB) 0.5-2.5 (3) MG/3ML SOLN 3 mLs every 4 (four) hours as needed.  08/02/18   [provider]  levothyroxine (SYNTHROID, LEVOTHROID) 50 MCG tablet Take 50 mcg by mouth daily before breakfast.     [provider]  magnesium oxide (MAG-OX) 400 MG tablet Take 400 mg by mouth 2 (two) times daily.    [provider]  nitroGLYCERIN (NITROSTAT) 0.4 MG SL tablet Take 0.4 mg by mouth every 5 (five) minutes x 3 doses as needed for chest pain. As needed for chest pain 12/06/15   [provider]    Inpatient Medications: Scheduled Meds:  Continuous Infusions:  PRN Meds:   Allergies:    Allergies  Allergen Reactions  . Amlodipine Other (See Comments)    Constipation  Constipation Constipation  . Benazepril-Hydrochlorothiazide Other (See Comments)    Constipation  Constipation Constipation  . Chlorthalidone Other (See Comments)    Hyponatremia Hyponatremia  . Lisinopril Other (See Comments)  . Other Other (See Comments)    "ANY BLOOD PRESSURE MEDICATIONS THAT I'VE TRIED" - PT. DOES NOT REMEMBER WHICH ONES "ANY BLOOD PRESSURE MEDICATIONS THAT I'VE TRIED"- PT. DOES NOT REMEMBER WHICH ONES- CONSTIPATION    Social History:   Social History   Tobacco Use  . Smoking status: Former Smoker    Packs/day: 1.00    Years: 50.00    Pack years: 50.00    Types: Cigarettes  . Smokeless tobacco: Never Used  Substance Use Topics  . Alcohol use: Yes    Comment: social   . Drug use: No     Family History:   Family History  Problem Relation Age of Onset  . Hypertension Mother   . Hyperlipidemia Mother   . Heart attack Mother   . Hypertension Father   . Hyperlipidemia Father   . Heart attack Father   . Prostate cancer Neg Hx   . Bladder Cancer Neg Hx   . Kidney cancer Neg Hx      ROS:  Review of Systems  Unable to perform ROS: Acuity of condition    Physical Exam/Data:   Vitals:   04/01/19 0010 04/01/19 0021 04/01/19 0027 04/01/19 0251  BP:  123/63 118/65   Pulse:  61 (!) 59 60  Resp:  14 14 (!) 22  Temp:    97.9 F (36.6 C)  TempSrc:    Oral  SpO2:  100% 100% 90%  Weight: (!) 136.5 kg   (!) 139.7 kg  Height: 5\' 7"  (1.702 m)   5\' 7"   (1.702 m)   No intake or output data in the 24 hours ending 04/01/19 0304 Last 3 Weights 04/01/2019 04/01/2019 08/16/2018  Weight (lbs) 307 lb 15.7 oz 301 lb 301 lb 8 oz  Weight (kg) 139.7 kg 136.533 kg 136.76 kg     Body mass index is 48.24 kg/m.  General:  Obese man, lying comfortably on stretcher. HEENT: normal Lymph: no adenopathy Neck: no JVD Endocrine:  No thryomegaly Vascular: No carotid bruits; 2+ radial pulses bilaterally Cardiac:  normal  S1, S2; RRR; 1/6 systolic murmur. Lungs:  clear to auscultation bilaterally, no wheezing, rhonchi or rales  Abd: soft, nontender, no hepatomegaly  Ext: 1+ ankle edema bilaterally Musculoskeletal:  No deformities, BUE and BLE strength normal and equal Skin: warm and dry  Neuro:  CNs 2-12 intact, no focal abnormalities noted Psych:  Normal affect   EKG:  The EKG was personally reviewed and demonstrates:  Sinus bradycardia with inferior ST elevation and lateral TWI and ST depression.  Relevant CV Studies: LHC/PCI (04/01/2019): 1. Severe single-vessel coronary artery disease with sequential 80-95% proximal and mid RCA stenoses with heavy thrombus causing inferior STEMI. 2. Mild, non-obstructive coronary artery disease involving the left coronary artery. 3. Patent distal RCA Taxus stent with mild in-stent restenosis. 4. Challenging but ultimately successful PCI to the proximal and mid RCA lesions with non-overlapping Resolute Onyx 4.0 x 26 mm (proximal - dilated to 4.4 mm) and 4.0 x 15 mm (distal - dilated to 4.2 mm) drug-eluting stents with 0% residual stenosis and TIMI-3 flow.  Echo (07/31/2017): - Procedure narrative: Transthoracic echocardiography. The study   was technically difficult. - Left ventricle: The cavity size was at the upper limits of   normal. Wall thickness was increased in a pattern of moderate   LVH. Systolic function was mildly to moderately reduced. The   estimated ejection fraction was in the range of 40% to 45%.    Doppler parameters are consistent with abnormal left ventricular   relaxation (grade 1 diastolic dysfunction). Doppler parameters   are consistent with high ventricular filling pressure. - Aortic valve: Trileaflet; mildly thickened, mildly calcified   leaflets. Valve mobility was mildly restricted. Transvalvular   velocity was minimally increased. There was mild to moderate   stenosis. Valve area (VTI): 1.67 cm^2. Valve area (Vmax): 1.25   cm^2. Valve area (Vmean): 1.29 cm^2. - Left atrium: The atrium was mildly dilated. - Right ventricle: The cavity size was normal. Systolic function   was normal. - Right atrium: The atrium was mildly dilated.  Laboratory Data:  Chemistry Recent Labs  Lab 04/01/19 0005  NA 133*  K 4.0  CL 101  CO2 21*  GLUCOSE 136*  BUN 34*  CREATININE 2.33*  CALCIUM 8.4*  GFRNONAA 25*  GFRAA 29*  ANIONGAP 11    Recent Labs  Lab 04/01/19 0005  PROT 6.7  ALBUMIN 3.1*  AST 16  ALT 12  ALKPHOS 137*  BILITOT 0.5   Hematology Recent Labs  Lab 04/01/19 0005  WBC 7.7  RBC 3.68*  HGB 10.0*  HCT 30.3*  MCV 82.3  MCH 27.2  MCHC 33.0  RDW 14.5  PLT 238   Cardiac Enzymes Recent Labs  Lab 04/01/19 0005  TROPONINI 0.03*   No results for input(s): TROPIPOC in the last 168 hours.  BNPNo results for input(s): BNP, PROBNP in the last 168 hours.  DDimer No results for input(s): DDIMER in the last 168 hours.  Radiology/Studies:  Dg Chest Port 1 View  Result Date: 04/01/2019 CLINICAL DATA:  Chest pain EXAM: PORTABLE CHEST 1 VIEW COMPARISON:  08/05/2017 FINDINGS: Cardiac shadow is stable. Aortic calcifications are again seen. The lungs are hyperinflated bilaterally. No focal infiltrate or sizable effusion is seen. Very mild vascular congestion is noted centrally. IMPRESSION: Mild vascular congestion.  No focal infiltrate is seen. Electronically Signed   By: Inez Catalina M.D.   On: 04/01/2019 00:43    Assessment and Plan:   Inferior STEMI Patient  with evidence of multiple thrombotic lesions in  the proximal and mid RCA, consistent with acute plaque rupture (type 1) MI.  He underwent successful primary PCI with resolution of symptoms.  DAPT with ASA and ticagrelor for at least 12 months, ideally longer given multiple stents in the RCA now and history of Taxus stent in the distal RCA.  Trend troponin until it has peaked, then stop.  Aggressive secondary prevention; will check lipid panel in the AM.  If LDL > 70, atorvastatin will need to be increased to 80 mg daily.  Continue carvedilol 25 mg BID.  Obtain echo.  Chronic systolic heart failure Patient with mild edema on exam.  No overt symptoms of decompensated heart failure.  LVEDP not measured during catheterization.  Obtain echo.  Gentle post-catheterization hydration, given CKD and contrast use.  If patient has worsening shortness of breath, IVF should be stopped and gentle diuresis instituted.  Continue carvedilol 25 mg BID.  Defer adding ACEI/ARB and aldosterone antagonist, given prior intolerances and advanced CKD.  Chronic kidney disease stage III GFR slightly below baseline.  Will need to monitor closely in light of 250 mL of IV contrast needed for today's cath/PCI.  Gentle post-catheterization hydration (NS @ 50 mL/hr for 12 hours).  Avoid nephrotoxic drugs.  Anemia Chronic and most likely due to chronic disease.  Patient denies recent bleeding.  Monitor hemoglobin in the setting of dual antiplatelet therapy.  Hyperlipidemia Goal LDL < 70.  Check fasting lipid panel in AM.  Escalate atorvastatin to 80 mg daily if LDL above goal.  Hypertension BP low on arrival but climbing post-PCI.  Continue carvedilol 25 mg BID.  Hold amlodipine for now, but may need to restart if BP rises.  Based on LVEF, patient may benefit from Bidil for BP control and evidence-based HF therapy.  Disposition  Admit to ICU on the hospitalist service.  Patient will need to remain  hospitalized for at least 48 hours (potentially longer based on renal function and volume status).  I attempted to reach the patient's daughter regarding tonight's events but did not receive an answer voicemail left to call when able.  For questions or updates, please contact [place] Please consult [email or web address] for contact info under [facility].  Signed, Nelva Bush, MD  04/01/2019 3:04 AM

## 2019-04-01 NOTE — ED Notes (Signed)
Pt being transported to the cath lab.

## 2019-04-01 NOTE — Consult Note (Signed)
Name: [name] MRN: [id number] DOB: 1935/07/14    ADMISSION DATE:  04/01/2019 CONSULTATION DATE: 04/14//2020  REFERRING MD : Dr. Saunders Revel   CHIEF COMPLAINT: Chest Pain   BRIEF PATIENT DESCRIPTION:  84 yo male admitted with inferior STEMI secondary to occlusion of the mid to proximal RCA requiring 2 drug eluting stents  SIGNIFICANT EVENTS/STUDIES:  04/14-Pt admitted to ICU s/p cardiac cath   HISTORY OF PRESENT ILLNESS:   This is an 84 yo male with a PMH as listed below who presented to [facility] ER on 04/14 with c/o 10/10 acute substernal chest pain radiating to the neck and associated mild shortness of breath onset of symptoms the evening of 04/13.  Per ER notes the pt took sublingual nitro x1 dose without significant improvement in symptoms prompting EMS notification.  Upon EMS arrival EKG revealed inferior ST elevation, therefore code STEMI initiated.  In the ER cardiology evaluated pt and he was emergently transported to the cardiac cath lab which revealed occlusions of the mid to proximal RCA requiring 2 drug eluting stents. He was subsequently admitted to ICU post cardiac cath for additional workup and treatment.   PAST MEDICAL HISTORY :   has a past medical history of Alcoholism ([place]), Asthma, Bipolar affective disorder ([place]), Chronic combined systolic and diastolic CHF (congestive heart failure) ([place]), CKD (chronic kidney disease), stage III ([place]), COPD (chronic obstructive pulmonary disease) ([place]), Coronary artery disease, Degenerative joint disease, Essential hypertension, Hyperlipidemia, Hypertension, Prostate CA ([place]), PVD (peripheral vascular disease) ([place]), and Tobacco abuse.  has a past surgical history that includes Total hip arthroplasty; Cardiac catheterization; and Coronary angioplasty with stent. Prior to Admission medications   Medication Sig Start Date End Date Taking? Authorizing Provider  albuterol (PROAIR HFA) 108 (90 Base) MCG/ACT inhaler Inhale 2 puffs into the lungs  every 6 (six) hours as needed for wheezing or shortness of breath. Reported on 04/21/2016 05/31/17 10/05/18  Loletha Grayer, MD  amLODipine (NORVASC) 5 MG tablet  05/06/18   [provider]  aspirin EC 81 MG tablet Take 81 mg by mouth daily.    [provider]  atorvastatin (LIPITOR) 40 MG tablet Take 40 mg by mouth daily.    [provider]  carvedilol (COREG) 25 MG tablet Take 25 mg by mouth 2 (two) times daily with a meal.    [provider]  fluticasone-salmeterol (ADVAIR HFA) 115-21 MCG/ACT inhaler Inhale 2 puffs into the lungs 2 (two) times daily.    [provider]  furosemide (LASIX) 20 MG tablet Take 20 mg by mouth 2 (two) times daily.    [provider]  ipratropium-albuterol (DUONEB) 0.5-2.5 (3) MG/3ML SOLN 3 mLs every 4 (four) hours as needed.  08/02/18   [provider]  levothyroxine (SYNTHROID, LEVOTHROID) 50 MCG tablet Take 50 mcg by mouth daily before breakfast.    [provider]  magnesium oxide (MAG-OX) 400 MG tablet Take 400 mg by mouth 2 (two) times daily.    [provider]  nitroGLYCERIN (NITROSTAT) 0.4 MG SL tablet Take 0.4 mg by mouth every 5 (five) minutes x 3 doses as needed for chest pain. As needed for chest pain 12/06/15   [provider]   Allergies  Allergen Reactions  . Amlodipine Other (See Comments)    Constipation  Constipation Constipation  . Benazepril-Hydrochlorothiazide Other (See Comments)    Constipation  Constipation Constipation  . Chlorthalidone Other (See Comments)    Hyponatremia Hyponatremia  . Lisinopril Other (See Comments)  .  Other Other (See Comments)    "ANY BLOOD PRESSURE MEDICATIONS THAT I'VE TRIED" - PT. DOES NOT REMEMBER WHICH ONES "ANY BLOOD PRESSURE MEDICATIONS THAT I'VE TRIED"- PT. DOES NOT REMEMBER WHICH ONES- CONSTIPATION    FAMILY HISTORY:  family history includes Heart attack in his father and mother; Hyperlipidemia in his father and  mother; Hypertension in his father and mother. SOCIAL HISTORY:  reports that he has quit smoking. His smoking use included cigarettes. He has a 50.00 pack-year smoking history. He has never used smokeless tobacco. He reports current alcohol use. He reports that he does not use drugs.  REVIEW OF SYSTEMS: Postives in BOLD  Constitutional: Negative for fever, chills, weight loss, malaise/fatigue and diaphoresis.  HENT: Negative for hearing loss, ear pain, nosebleeds, congestion, sore throat, neck pain, tinnitus and ear discharge.   Eyes: Negative for blurred vision, double vision, photophobia, pain, discharge and redness.  Respiratory: cough, hemoptysis, sputum production, mild shortness of breath, wheezing and stridor.   Cardiovascular: chest pain, palpitations, orthopnea, claudication, leg swelling and PND.  Gastrointestinal: Negative for heartburn, nausea, vomiting, abdominal pain, diarrhea, constipation, blood in stool and melena.  Genitourinary: Negative for dysuria, urgency, frequency, hematuria and flank pain.  Musculoskeletal: Negative for myalgias, back pain, joint pain and falls.  Skin: Negative for itching and rash.  Neurological: Negative for dizziness, tingling, tremors, sensory change, speech change, focal weakness, seizures, loss of consciousness, weakness and headaches.  Endo/Heme/Allergies: Negative for environmental allergies and polydipsia. Does not bruise/bleed easily.  SUBJECTIVE:  No complaints at this time   VITAL SIGNS: Temp:  [98.6 F (37 C)] 98.6 F (37 C) (04/14 0008) Pulse Rate:  [57-61] 59 (04/14 0027) Resp:  [14-17] 14 (04/14 0027) BP: (118-123)/(63-65) 118/65 (04/14 0027) SpO2:  [96 %-100 %] 100 % (04/14 0027) Weight:  [136.5 kg] 136.5 kg (04/14 0010)  PHYSICAL EXAMINATION: General: well developed, well nourished male, NAD  Neuro: alert and oriented, follows commands HEENT: supple, no JVD  Cardiovascular: sinus brady, no R/G  Lungs: clear throughout,  even, non labored  Abdomen: +BS x4, soft, obese, non tender, non distended  Musculoskeletal: normal bulk and tone, trace bilateral lower extremity edema  Skin: right radial TR band dry intact no hematoma or bleeding present   Recent Labs  Lab 04/01/19 0005  NA 133*  K 4.0  CL 101  CO2 21*  BUN 34*  CREATININE 2.33*  GLUCOSE 136*   Recent Labs  Lab 04/01/19 0005  HGB 10.0*  HCT 30.3*  WBC 7.7  PLT 238   Dg Chest Port 1 View  Result Date: 04/01/2019 CLINICAL DATA:  Chest pain EXAM: PORTABLE CHEST 1 VIEW COMPARISON:  08/05/2017 FINDINGS: Cardiac shadow is stable. Aortic calcifications are again seen. The lungs are hyperinflated bilaterally. No focal infiltrate or sizable effusion is seen. Very mild vascular congestion is noted centrally. IMPRESSION: Mild vascular congestion.  No focal infiltrate is seen. Electronically Signed   By: Inez Catalina M.D.   On: 04/01/2019 00:43    ASSESSMENT / PLAN:  Inferior STEMI Hx: HTN, HLD, Chronic systolic and diastolic CHF, CAD, and PVD  Continuous telemetry monitoring  Trend troponin's  Cardiology consulted appreciate input-cardiac med per recommendation  Supplemental O2 for hypoxia and/or dyspnea  Post cath EKG   Stage III CKD Trend BMP Replace electrolytes as indicated  Monitor UOP  Avoid nephrotoxic medications   Anemia without obvious blood loss VTE px: subq heparin  Trend CBC  Monitor for s/sx of bleeding and transfuse for hgb ,8  Marda Stalker, [place] Pager [phone] (please enter 7 digits) PCCM Consult Pager [phone] (please enter 7 digits)

## 2019-04-02 ENCOUNTER — Telehealth: Payer: Self-pay | Admitting: Cardiovascular Disease

## 2019-04-02 ENCOUNTER — Encounter: Payer: Self-pay | Admitting: Nurse Practitioner

## 2019-04-02 ENCOUNTER — Telehealth: Payer: Self-pay | Admitting: Internal Medicine

## 2019-04-02 DIAGNOSIS — Z9181 History of falling: Secondary | ICD-10-CM

## 2019-04-02 DIAGNOSIS — I213 ST elevation (STEMI) myocardial infarction of unspecified site: Secondary | ICD-10-CM

## 2019-04-02 LAB — COMPREHENSIVE METABOLIC PANEL
ALT: 15 U/L (ref 0–44)
AST: 56 U/L — ABNORMAL HIGH (ref 15–41)
Albumin: 3.2 g/dL — ABNORMAL LOW (ref 3.5–5.0)
Alkaline Phosphatase: 124 U/L (ref 38–126)
Anion gap: 8 (ref 5–15)
BUN: 32 mg/dL — ABNORMAL HIGH (ref 8–23)
CO2: 22 mmol/L (ref 22–32)
Calcium: 8.3 mg/dL — ABNORMAL LOW (ref 8.9–10.3)
Chloride: 102 mmol/L (ref 98–111)
Creatinine, Ser: 2.15 mg/dL — ABNORMAL HIGH (ref 0.61–1.24)
GFR calc Af Amer: 32 mL/min — ABNORMAL LOW (ref >60)
GFR calc non Af Amer: 28 mL/min — ABNORMAL LOW (ref >60)
Glucose, Bld: 96 mg/dL (ref 70–99)
Potassium: 4.4 mmol/L (ref 3.5–5.1)
Sodium: 132 mmol/L — ABNORMAL LOW (ref 135–145)
Total Bilirubin: 0.7 mg/dL (ref 0.3–1.2)
Total Protein: 6.4 g/dL — ABNORMAL LOW (ref 6.5–8.1)

## 2019-04-02 LAB — CBC
HCT: 31 % — ABNORMAL LOW (ref 39.0–52.0)
Hemoglobin: 10.1 g/dL — ABNORMAL LOW (ref 13.0–17.0)
MCH: 27.4 pg (ref 26.0–34.0)
MCHC: 32.6 g/dL (ref 30.0–36.0)
MCV: 84 fL (ref 80.0–100.0)
Platelets: 223 10*3/uL (ref 150–400)
RBC: 3.69 MIL/uL — ABNORMAL LOW (ref 4.22–5.81)
RDW: 14.8 % (ref 11.5–15.5)
WBC: 7.2 10*3/uL (ref 4.0–10.5)
nRBC: 0 % (ref 0.0–0.2)

## 2019-04-02 LAB — HEMOGLOBIN A1C
Hgb A1c MFr Bld: 6.2 % — ABNORMAL HIGH (ref 4.8–5.6)
Mean Plasma Glucose: 131 mg/dL

## 2019-04-02 MED ORDER — TICAGRELOR 90 MG PO TABS: 90.0000 mg | ORAL_TABLET | Freq: Two times a day (BID) | ORAL | 0 refills | Status: DC

## 2019-04-02 MED ORDER — MAGNESIUM CITRATE PO SOLN
0.5000 | Freq: Once | ORAL | Status: AC
  Administered 2019-04-02: 0.5 via ORAL
  Filled 2019-04-02: qty 296

## 2019-04-02 MED ORDER — CARVEDILOL 12.5 MG PO TABS: 12.5000 mg | ORAL_TABLET | Freq: Two times a day (BID) | ORAL | Status: DC

## 2019-04-02 MED ORDER — SODIUM POLYSTYRENE SULFONATE 15 GM/60ML PO SUSP: 15.0000 g | Freq: Once | ORAL | Status: DC

## 2019-04-02 MED ORDER — CARVEDILOL 12.5 MG PO TABS
12.5000 mg | ORAL_TABLET | Freq: Two times a day (BID) | ORAL | Status: DC
  Administered 2019-04-02: 12.5 mg via ORAL
  Filled 2019-04-02: qty 1

## 2019-04-02 NOTE — TOC Transition Note (Signed)
Transition of Care [facility]) - CM/SW Discharge Note   Patient Details  Name: [name] MRN: [id number] Date of Birth: 1935/08/26  [facility] of [facility]) CM/SW Contact:  Ross Ludwig, LCSW Phone Number: 04/02/2019, 4:47 PM   Clinical Narrative:    Patient is [name], faxed clinical information for insurance authorization.  Patient has agreed to bed search in [facility], awaiting bed offers, and insurance authorization.  CSW presented bed offers to patient's daughter and she chose [facility], CSW awaiting for call back from insurance company.  4:00pm  CSW spoke with patient's insurance company and they have approved patient for 6 days reference number is [phone].  CSW contacted [facility] and they can accept patient today.  CSW contacted patient's daughter and informed her that patient will be able to discharge today.         Final next level of care: Skilled Nursing Facility Barriers to Discharge: Barriers Resolved   Patient Goals and CMS Choice Patient states their goals for this hospitalization and ongoing recovery are:: would like STR; no preference CMS Medicare.gov Compare Post Acute Care list provided to:: Patient Represenative (must comment)(daughter [name])    Discharge Placement   Existing PASRR number confirmed : 04/02/19          Patient chooses bed at: [facility] Patient to be transferred to facility by: [facility] EMS Name of family member notified: Patient's daughter [name] [phone] Patient and family notified of of transfer: 04/02/19  Discharge Plan and Services  Patient to be d/c'ed today to [facility] room 42.  Patient and family agreeable to plans will transport via ems RN to call report [phone].  Patient's daughter is aware that patient is discharging today to SNF.   Discharge Planning Services: CM Consult Post Acute Care Choice: [place]                     Social Determinants of Health (SDOH) Interventions     Readmission Risk Interventions No flowsheet data found.

## 2019-04-02 NOTE — Progress Notes (Signed)
Called report to [facility] care and talk to [place], [place]. Discontinue telemetry monitor. Called EMS and waiting for them to transport patient then will also discontinue PIV when EMS is here.

## 2019-04-02 NOTE — Evaluation (Signed)
Physical Therapy Evaluation Patient Details Name: [name] MRN: [id number] DOB: December 05, 1935 Today's Date: 04/02/2019   History of Present Illness  84 y/o man with h/o CAD, chronic systolic HF, and CKD stage III,bipolar disorder, COPD, HTN, prostate cancer,  admitted with inferior STEMI s/p primary PCI 04/01/2019.     Clinical Impression  Patient agreeable to PT, anxious about mobility due to chronic knee pain. Pt able to provide extensive PLOF information. Per pt he lives alone in an apartment complex (elevator to access home) but 3-4 STE. His daughter visits once a week to assist with groceries/driving/MD appts, as well as stair navigation. Pt reports that at home he stands and pivots to his rollator and rolls around his apartment, states he does not walk at all at baseline.   Pt demonstrated bed mobility mod I, able to sit EOB with good balance, mild complaints of dizziness that quickly resolved. HR and spO2 monitored throughout session, mild desat to 87-88 on room air, but recovered quickly with rest and cues for deep breathing. Pt performed stand pivot to chair with minA, needed multiple attempts to achieve standing, 1 instance of bilateral knee buckling with quick return to sitting EOB. Pt relied heavily on LUE via bed rail and chair arm for successful transfer.  Overall the patient demonstrated deficits (see "PT Problem List") that impede the patient's functional abilities, safety, and mobility and would benefit from skilled PT intervention. Recommendation is STR due to current level of assistance needed, decreased caregiver support, and inaccessible home environment.      Follow Up Recommendations SNF    Equipment Recommendations  Other (comment)(TBD, may benefit from [facility])    Recommendations for Other Services       Precautions / Restrictions Precautions Precautions: Other (comment) Precaution Comments: NWB to RUE 24-48hrs s/p heart catherization  Restrictions Weight Bearing  Restrictions: No      Mobility  Bed Mobility Overal bed mobility: Modified Independent                Transfers Overall transfer level: Needs assistance Equipment used: 1 person hand held assist Transfers: Stand Pivot Transfers Sit to Stand: Min assist         General transfer comment: Pt relies heavily on LUE during transfer using bed rail, and then chair arm. first sit to stand, buckled knees, quick return to sitting EOB. Pt grimacing and complaining of bilateral knee pain throughout movements  Ambulation/Gait             General Gait Details: deferred due to safety concerns  Stairs            Wheelchair Mobility    Modified Rankin (Stroke Patients Only)       Balance Overall balance assessment: Needs assistance Sitting-balance support: Feet supported Sitting balance-Leahy Scale: Good       Standing balance-Leahy Scale: Poor                               Pertinent Vitals/Pain Pain Assessment: Faces Faces Pain Scale: Hurts whole lot Pain Location: bilateral knees with mobility Pain Descriptors / Indicators: Grimacing;Moaning;Guarding Pain Intervention(s): Limited activity within patient's tolerance;Repositioned;Monitored during session    Home Living Family/patient expects to be discharged to:: Private residence Living Arrangements: Alone Available Help at Discharge: Family;Available PRN/intermittently Type of Home: Apartment Home Access: Stairs to enter Entrance Stairs-Rails: Chemical engineer of Steps: 3 Home Layout: One level Home Equipment: Walker - 4  wheels;Cane - single point;Shower seat;Grab bars - tub/shower      Prior Function Level of Independence: Needs assistance   Gait / Transfers Assistance Needed: Pt performs stand pivot transfers to rollator, sits in rollator all day and uses this to move around his apartment.   ADL's / Homemaking Assistance Needed: reported performing own ADLs, stated it  is getting harder to perform. Daughter assists with grocery trips/MD appts, pt reported he does his own cooking/cleaning        Hand Dominance   Dominant Hand: Left    Extremity/Trunk Assessment   Upper Extremity Assessment Upper Extremity Assessment: RUE deficits/detail;LUE deficits/detail RUE Deficits / Details: not assessed due to NWB LUE Deficits / Details: WFLs for tasks performed    Lower Extremity Assessment Lower Extremity Assessment: Generalized weakness(difficult to assess due to knee pain)       Communication   Communication: No difficulties  Cognition Arousal/Alertness: Awake/alert Behavior During Therapy: WFL for tasks assessed/performed Overall Cognitive Status: Within Functional Limits for tasks assessed                                        General Comments      Exercises     Assessment/Plan    PT Assessment Patient needs continued PT services  PT Problem List Decreased strength;Pain;Decreased range of motion;Decreased activity tolerance;Decreased balance;Decreased safety awareness;Decreased mobility;Decreased knowledge of precautions       PT Treatment Interventions DME instruction;Therapeutic exercise;Gait training;Balance training;Wheelchair mobility training;Stair training;Neuromuscular re-education;Functional mobility training;Therapeutic activities;Patient/family education    PT Goals (Current goals can be found in the Care Plan section)  Acute Rehab PT Goals Patient Stated Goal: to go home PT Goal Formulation: With patient Time For Goal Achievement: 04/16/19 Potential to Achieve Goals: Fair    Frequency Min 2X/week   Barriers to discharge Inaccessible home environment;Decreased caregiver support      Co-evaluation               AM-PAC PT "6 Clicks" Mobility  Outcome Measure Help needed turning from your back to your side while in a flat bed without using bedrails?: None Help needed moving from lying on your  back to sitting on the side of a flat bed without using bedrails?: None Help needed moving to and from a bed to a chair (including a wheelchair)?: A Lot Help needed standing up from a chair using your arms (e.g., wheelchair or bedside chair)?: A Lot Help needed to walk in hospital room?: Total Help needed climbing 3-5 steps with a railing? : Total 6 Click Score: 14    End of Session Equipment Utilized During Treatment: Gait belt Activity Tolerance: Patient limited by fatigue;Patient limited by pain Patient left: in chair;with chair alarm set;with call bell/phone within reach Nurse Communication: Mobility status;Precautions PT Visit Diagnosis: Difficulty in walking, not elsewhere classified (R26.2);Muscle weakness (generalized) (M62.81);Other abnormalities of gait and mobility (R26.89);Pain Pain - Right/Left: Right(and L) Pain - part of body: Knee    Time: 0934-1000 PT Time Calculation (min) (ACUTE ONLY): 26 min   Charges:   PT Evaluation $PT Eval Moderate Complexity: 1 Mod PT Treatments $Therapeutic Activity: 8-22 mins       Lieutenant Diego PT, DPT 10:30 AM,04/02/19 [phone]

## 2019-04-02 NOTE — Telephone Encounter (Signed)
Pending Consent for Virtual Visit

## 2019-04-02 NOTE — Telephone Encounter (Signed)
-----   Message from Arvil Chaco, PA-C sent at 04/02/2019 12:54 PM EDT ----- Regarding: TCM Hello,  This patient was admitted and seen at [facility] for STEMI.  We are expecting discharge 4/16.  Can you please call by Friday 4/17 and arrange / schedule for follow-up TCM appointment with Dr. Fletcher Anon within the next two weeks?  Thank you! Signed, Arvil Chaco, PA-C 04/02/2019, 12:55 PM Pager [phone]

## 2019-04-02 NOTE — NC FL2 (Signed)
[place] LEVEL OF CARE SCREENING TOOL     IDENTIFICATION  Patient Name: [name] Birthdate: 05-13-1935 Sex: male Admission Date (Current Location): 04/01/2019  [place] and [place] Number:  Engineering geologist and Address:  [facility], [address] [place], [place] [address]      Provider Number: [id number]  Attending Physician Name and Address:  Bettey Costa, MD  Relative Name and Phone Number:  [place] Daughter [phone]     Current Level of Care: Hospital Recommended Level of Care: [place] Prior Approval Number:    Date Approved/Denied:   PASRR Number: [id number]  Discharge Plan: SNF    Current Diagnoses: Patient Active Problem List   Diagnosis Date Noted  . STEMI (ST elevation myocardial infarction) ([place]) 04/01/2019  . STEMI involving right coronary artery ([place]) 04/01/2019  . Acute ST elevation myocardial infarction (STEMI) ([place]) 04/01/2019  . Lower extremity pain, bilateral 05/15/2018  . Elevated troponin I level 07/31/2017  . Hyperkalemia 05/28/2017  . Syncope 05/27/2017  . Meningioma ([place]) 01/05/2017  . GI bleed 12/01/2016  . Alcohol abuse 10/30/2016  . Chronic systolic heart failure ([place]) 10/04/2016  . COPD (chronic obstructive pulmonary disease) with chronic bronchitis ([place]) 10/04/2016  . Obstructive sleep apnea 10/04/2016  . Hyponatremia 08/31/2016  . UTI (lower urinary tract infection) 08/31/2016  . Exertional dyspnea 02/11/2016  . Hyperlipidemia 12/03/2015  . Essential hypertension   . Coronary artery disease 01/19/2004    Orientation RESPIRATION BLADDER Height & Weight     Self, Time, Situation, Place  Normal Continent Weight: (!) 309 lb 8 oz (140.4 kg) Height:  5\' 7"  (170.2 cm)  BEHAVIORAL SYMPTOMS/MOOD NEUROLOGICAL BOWEL NUTRITION STATUS      Continent Diet(Cardiac Diet)  AMBULATORY STATUS COMMUNICATION OF NEEDS Skin   Limited Assist Verbally Normal                      Personal Care Assistance Level of Assistance  Bathing, Feeding, Dressing Bathing Assistance: Limited assistance Feeding assistance: Independent Dressing Assistance: Limited assistance     Functional Limitations Info  Sight, Hearing, Speech Sight Info: Adequate Hearing Info: Adequate Speech Info: Adequate    SPECIAL CARE FACTORS FREQUENCY                       Contractures Contractures Info: Not present    Additional Factors Info  Allergies   Allergies Info: AMLODIPINE, BENAZEPRIL-HYDROCHLOROTHIAZIDE, CHLORTHALIDONE, LISINOPRIL, OTHER           Current Medications (04/02/2019):  This is the current hospital active medication list Current Facility-Administered Medications  Medication Dose Route Frequency Provider Last Rate Last Dose  . 0.9 %  sodium chloride infusion  250 mL Intravenous PRN End, Harrell Gave, MD      . acetaminophen (TYLENOL) tablet 650 mg  650 mg Oral Q4H PRN End, Harrell Gave, MD      . aspirin EC tablet 81 mg  81 mg Oral Daily End, Christopher, MD   81 mg at 04/02/19 0824  . atorvastatin (LIPITOR) tablet 40 mg  40 mg Oral Daily End, Christopher, MD   40 mg at 04/02/19 0824  . carvedilol (COREG) tablet 25 mg  25 mg Oral BID WC End, Christopher, MD   25 mg at 04/02/19 0824  . heparin injection 5,000 Units  5,000 Units Subcutaneous Q8H End, Christopher, MD   5,000 Units at 04/02/19 0509  . ipratropium-albuterol (DUONEB) 0.5-2.5 (3) MG/3ML nebulizer solution 3 mL  3 mL Nebulization Q4H PRN End, Harrell Gave, MD      . levothyroxine (SYNTHROID, LEVOTHROID) tablet 50 mcg  50 mcg Oral QAC breakfast End, Christopher, MD   50 mcg at 04/02/19 [phone]  . nitroGLYCERIN (NITROSTAT) SL tablet 0.4 mg  0.4 mg Sublingual Q5 min PRN End, Harrell Gave, MD      . ondansetron (ZOFRAN) injection 4 mg  4 mg Intravenous Q6H PRN End, Harrell Gave, MD   4 mg at 04/02/19 0507  . sodium chloride flush (NS) 0.9 % injection 3 mL  3 mL Intravenous Q12H End, Christopher, MD   3  mL at 04/02/19 0824  . sodium chloride flush (NS) 0.9 % injection 3 mL  3 mL Intravenous PRN End, Christopher, MD      . ticagrelor (BRILINTA) tablet 90 mg  90 mg Oral BID End, Christopher, MD   90 mg at 04/02/19 5597     Discharge Medications: Please see discharge summary for a list of discharge medications.  Relevant Imaging Results:  Relevant Lab Results:   Additional Information SSN [id number]  Ross Ludwig, LCSW

## 2019-04-02 NOTE — Progress Notes (Signed)
Progress Note  Patient Name: [name] Date of Encounter: 04/02/2019  Primary Cardiologist: Kathlyn Sacramento, MD   Subjective   He continues to feel much better. No reported soreness in his chest. He also did not report further shortness of breath, despite his labored breathing. Intermittent nausea reported overnight; however, this morning, he is eating french toast without further report of nausea. He reportedly scoots around on a scooter at home, so we discussed the need for PT and that he should attempt ambulation with a walker before discharge.    Inpatient Medications    Scheduled Meds: . aspirin EC  81 mg Oral Daily  . atorvastatin  40 mg Oral Daily  . carvedilol  25 mg Oral BID WC  . heparin  5,000 Units Subcutaneous Q8H  . levothyroxine  50 mcg Oral QAC breakfast  . sodium chloride flush  3 mL Intravenous Q12H  . ticagrelor  90 mg Oral BID   Continuous Infusions: . sodium chloride     PRN Meds: sodium chloride, acetaminophen, ipratropium-albuterol, nitroGLYCERIN, ondansetron (ZOFRAN) IV, sodium chloride flush   Vital Signs    Vitals:   04/01/19 1923 04/02/19 0424 04/02/19 0616 04/02/19 0729  BP: (!) 128/55 (!) 118/52  120/62  Pulse: (!) 52 (!) 52  (!) 52  Resp: 16 16  19   Temp: 97.9 F (36.6 C) 98.1 F (36.7 C)  98.7 F (37.1 C)  TempSrc: Oral Oral  Oral  SpO2: 99% 96% 92% 97%  Weight:  (!) 140.4 kg    Height:        Intake/Output Summary (Last 24 hours) at 04/02/2019 0839 Last data filed at 04/02/2019 0445 Gross per 24 hour  Intake 245 ml  Output 1575 ml  Net -1330 ml   Filed Weights   04/01/19 0010 04/01/19 0251 04/02/19 0424  Weight: (!) 136.5 kg (!) 139.7 kg (!) 140.4 kg    Telemetry    SB to NSR, intermittent ectopy - Personally Reviewed  ECG    Repeat EKG after primary PCI shows resolution of inferior ST elevation. SB with PACs. 59bpm. Upsloping ST segment in inferior leads: II, III, avF. TWI aVL post PCI with TWI in aVL.  - Personally  Reviewed  Physical Exam   GEN: Obese. No acute distress.   Neck: No JVD Cardiac: SB to regular rate, regular rhythm, extrasystoles, 1/6 systolic murmur, no rubs, or gallops.  Respiratory: Breathing appears labored; bilateral rhonchi. GI: Obese, soft, nontender, non-distended  MS: No lower extremity edema; No deformity. R radial arteriotomy without hematoma or signs of swelling, infection Neuro:  Nonfocal  Psych: Normal affect   Labs    Chemistry Recent Labs  Lab 04/01/19 0005 04/01/19 0431 04/02/19 0410  NA 133* 132* 132*  K 4.0 5.3* 4.4  CL 101 101 102  CO2 21* 22 22  GLUCOSE 136* 141* 96  BUN 34* 34* 32*  CREATININE 2.33* 2.35* 2.15*  CALCIUM 8.4* 8.5* 8.3*  PROT 6.7  --  6.4*  ALBUMIN 3.1*  --  3.2*  AST 16  --  56*  ALT 12  --  15  ALKPHOS 137*  --  124  BILITOT 0.5  --  0.7  GFRNONAA 25* 25* 28*  GFRAA 29* 29* 32*  ANIONGAP 11 9 8      Hematology Recent Labs  Lab 04/01/19 0005 04/01/19 0431 04/02/19 0410  WBC 7.7 8.2 7.2  RBC 3.68* 3.87* 3.69*  HGB 10.0* 10.5* 10.1*  HCT 30.3* 32.0* 31.0*  MCV  82.3 82.7 84.0  MCH 27.2 27.1 27.4  MCHC 33.0 32.8 32.6  RDW 14.5 14.4 14.8  PLT 238 227 223    Cardiac Enzymes Recent Labs  Lab 04/01/19 0005 04/01/19 0431 04/01/19 1118 04/01/19 1804  TROPONINI 0.03* 14.20* 40.61* 25.21*   No results for input(s): TROPIPOC in the last 168 hours.   BNPNo results for input(s): BNP, PROBNP in the last 168 hours.   DDimer No results for input(s): DDIMER in the last 168 hours.   Radiology    Dg Chest Port 1 View  Result Date: 04/01/2019 CLINICAL DATA:  Chest pain EXAM: PORTABLE CHEST 1 VIEW COMPARISON:  08/05/2017 FINDINGS: Cardiac shadow is stable. Aortic calcifications are again seen. The lungs are hyperinflated bilaterally. No focal infiltrate or sizable effusion is seen. Very mild vascular congestion is noted centrally. IMPRESSION: Mild vascular congestion.  No focal infiltrate is seen. Electronically Signed   By:  Inez Catalina M.D.   On: 04/01/2019 00:43    Cardiac Studies   TTE 04/01/2019 1. Severe hypokinesis of the left ventricular, entire inferior wall and inferoseptal wall.  2. The left ventricle has mildly reduced systolic function, with an ejection fraction of 45-50%. The cavity size was normal. There is severely increased left ventricular wall thickness. Left ventricular diastolic function could not be evaluated due to  nondiagnostic images.  3. Left atrial size was not well visualized.  4. The mitral valve was not well visualized. There is mild mitral annular calcification present.  5. The aortic valve is tricuspid. Mild thickening of the aortic valve. Mild calcification of the aortic valve. Aortic valve regurgitation was not assessed by color flow Doppler.  6. Degree of aortic stenosis could not be assessed due to lack of apical windows for pulse wave Doppler of the LVOT/AoV. However, it does not appear to be severe.  7. Moderately dilated pulmonary artery.  8. The interatrial septum was not well visualized.  LHC/PCI (04/01/2019): 1. Severe single-vessel coronary artery disease with sequential 80-95% proximal and mid RCA stenoses with heavy thrombus causing inferior STEMI. 2. Mild, non-obstructive coronary artery disease involving the left coronary artery. 3. Patent distal RCA Taxus stent with mild in-stent restenosis. 4. Challenging but ultimately successful PCI to the proximal and mid RCA lesions with non-overlapping Resolute Onyx 4.0 x 26 mm (proximal - dilated to 4.4 mm) and 4.0 x 15 mm (distal - dilated to 4.2 mm) drug-eluting stents with 0% residual stenosis and TIMI-3 flow.  Echo (07/31/2017): - Procedure narrative: Transthoracic echocardiography. The study was technically difficult. - Left ventricle: The cavity size was at the upper limits of normal. Wall thickness was increased in a pattern of moderate LVH. Systolic function was mildly to moderately reduced. The estimated  ejection fraction was in the range of 40% to 45%. Doppler parameters are consistent with abnormal left ventricular relaxation (grade 1 diastolic dysfunction). Doppler parameters are consistent with high ventricular filling pressure. - Aortic valve: Trileaflet; mildly thickened, mildly calcified leaflets. Valve mobility was mildly restricted. Transvalvular velocity was minimally increased. There was mild to moderate stenosis. Valve area (VTI): 1.67 cm^2. Valve area (Vmax): 1.25 cm^2. Valve area (Vmean): 1.29 cm^2. - Left atrium: The atrium was mildly dilated. - Right ventricle: The cavity size was normal. Systolic function was normal. - Right atrium: The atrium was mildly dilated.  Patient Profile     84 y.o. male h/o CAD s/p Taxus DES to distal RCA (2005) and RCA PCI (5/64/33), chronic systolic / diastolic HF, mild to moderate AS,  PAD, HTN, CKD stage III, COPD, OSA, morbid obesity, and admitted with inferior STEMI s/p primary PCI overnight.  Assessment & Plan    Inferior STEMI s/p PCI - No further angina s/p PCI to RCA for inferior STEMI. Per Dr. Saunders Revel, no evidence of acute radiation injury following prolonged fluro / high dose radiation d/t challenging PCI and morbid obesity. Would continue ongoing surveillance and patient counseling. - EKG showed resolution of ST elevation - Echo above on 4/14 with LVEF 45-50%, AS degree could not be assessed but did not appear severe - Troponin peaked at 40.61  25.21 - Daily BMET recommended to monitor renal function and electrolytes. K 4.4 -monitor for hyperkalemia. Cr 2.15 and stable. - Continue DAPT with ASA and ticagrelor for at least 12 months or longer given multiple RCA stents and h/o Taxus stent in distal RCA. - Aggressive secondary prevention. LDL 55 and below 70. Continue current statin therapy. Continue Coreg 25mg  BID.  - Recommend continue to monitor for 48h post catheterization and PCI and possible discharge tomorrow 4/16.  Ambulation recommended prior to discharge. Will need cardiac rehab  / PT following discharge given reportedly only "scoots" around at home.   Chronic systolic / diastolic heart failure - No reported SOB. No LEE on exam - Echo as above with EF 45-50% - Received gentle IVF following cath d/t CKD with contrast use.  - Continue BB as above.  - As below, defer ACE/ARB and aldosterone antagonist given intolerances and CKD  HTN - Controlled. Continue BB. PTA amlodipine still on hold given earlier hypotension.   Anemia - Likely of chronic disease. No recent reported bleeding. Hgb 10.1 - Continue to monitor with daily CBC given DAPT. Transfuse below 8.0.  CKDIII - S/p PCI with 12h IVF hydration given contrast administered in setting of CKD. Given renal insufficiency, ACE/ARB not ideal. Cr stable at this time - Renally dose medications. Avoid nephrotoxic drugs. Avoid further contrast procedures.   OSA - CPAP encouraged. He does not reportedly use at home   For questions or updates, please contact [place] Please consult [email or web address] for contact info under        Signed, Arvil Chaco, PA-C  04/02/2019, 8:39 AM

## 2019-04-02 NOTE — Clinical Social Work Note (Addendum)
Patient is [name], faxed clinical information for insurance authorization.  Patient has agreed to bed search in [facility], awaiting bed offers, and insurance authorization.  CSW presented bed offers to patient's daughter and she chose [facility], CSW awaiting for call back from insurance company.  4:00pm  CSW spoke with patient's insurance company and they have approved patient for 6 days reference number is [phone].  CSW contacted [facility] and they can accept patient today.  CSW contacted patient's daughter and informed her that patient will be able to discharge today.  Jones Broom. Nichole Neyer, MSW, LCSW [phone]  04/02/2019 11:47 AM

## 2019-04-02 NOTE — Progress Notes (Signed)
EMS is here to pick up patient, belongings and discharge packet was sent to EMS. Discontinue peripheral IV.

## 2019-04-02 NOTE — Telephone Encounter (Signed)
Patient being discharged today. Will call tomorrow for a follow up.

## 2019-04-02 NOTE — Discharge Summary (Addendum)
[place] [facility] [place] NAME: [name]    MR#:  [id number]  DATE OF BIRTH:  Apr 30, 1935  DATE OF ADMISSION:  04/01/2019 ADMITTING PHYSICIAN: Nelva Bush, MD  DATE OF DISCHARGE: 04/02/2019  PRIMARY CARE PHYSICIAN: Marguerita Merles, MD    ADMISSION DIAGNOSIS:  ST elevation myocardial infarction (STEMI), unspecified artery ([place]) [I21.3] STEMI involving right coronary artery ([place]) [I21.11]  DISCHARGE DIAGNOSIS:  Principal Problem:   STEMI (ST elevation myocardial infarction) ([place]) Active Problems:   STEMI involving right coronary artery ([place])   Acute ST elevation myocardial infarction (STEMI) ([place])   SECONDARY DIAGNOSIS:   Past Medical History:  Diagnosis Date  . Alcoholism ([place])   . Asthma   . Bipolar affective disorder ([place])   . Chronic combined systolic and diastolic CHF (congestive heart failure) ([place])    a. 08/2016 Echo: EF 40-45%, mild AS, mild to mod MR, mildly dil LA/RA, mild-mod TR; b. 07/2017 Echo: EF 40-45%, mod LVH, Gr1 DD, mild to mod AS, mildly dil LA, nl RV fxn; c. 03/2019 Echo: EF 45-50%, AS (not severe). Mod dil PA.  . CKD (chronic kidney disease), stage III ([place])   . COPD (chronic obstructive pulmonary disease) ([place])   . Coronary artery disease    a. 01/2004 s/p PCI and Taxus DES to dRCA (3.5 x 12 mm); b. 07/2017 Lexiscan MV: no ischemia. Sm area of apicl thinning, likely attenuation. EF 33% (GI uptake noted)-->Low risk; c. 03/2019 Inf STEMI/PCI: LM nl, LAD min irregs, D1 20ost, RI 20ost, LCX nl, RCA 90p/79m (4.0x26 Resolute Onyx DES), 24m (4.0x15 Resolute Onyx DES), 10d ISR.  Marland Kitchen Degenerative joint disease   . Essential hypertension   . Hyperlipidemia   . Hypertension   . Ischemic cardiomyopathy    a. 08/2016 Echo: EF 40-45%; b. 07/2017 Echo: EF 40-45%; c. 03/2019 Echo: EF 45-50%.  . Prostate CA ([place])    prostate ca dx 20 yrs ago  . PVD (peripheral vascular disease) ([place])   . Tobacco abuse     HOSPITAL COURSE:   84 year old male with history of hypertension, kidney disease with baseline creatinine of 2, COPD and chronic combined systolic and diastolic heart failure who presented to the emergency room due to chest pain.  1.  Inferior ST elevation MI: Patient was taken to cardiac catheterization lab.He had evidence of multiple thrombotic lesions in the proximal and mid RCA, consistent with acute plaque rupture (type 1) MI.  He underwent successful primary PCI with resolution of symptoms. He will continue aspirin, statin, Brilinta and Coreg.. ACE inhibitor/ARB not recommended at this time due to acute kidney injury. Cardiac rehab at discharge. LDL  55 A1c 6.2 Trop max 40.61   LHC/PCI (04/01/2019): 1. Severe single-vessel coronary artery disease with sequential 80-95% proximal and mid RCA stenoses with heavy thrombus causing inferior STEMI. 2. Mild, non-obstructive coronary artery disease involving the left coronary artery. 3. Patent distal RCA Taxus stent with mild in-stent restenosis. 4. Challenging but ultimately successful PCI to the proximal and mid RCA lesions with non-overlapping Resolute Onyx 4.0 x 26 mm (proximal - dilated to 4.4 mm) and 4.0 x 15 mm (distal - dilated to 4.2 mm) drug-eluting stents with 0% residual stenosis and TIMI-3 flow.   2.  Acute on chronic kidney injury: Creatinine is near baseline at the time of discharge.   He was given IV fluids prior and post cardiac cath.    3.  Morbid obesity: Encouraged weight loss as tolerated  4.  Chronic systolic and diastolic heart failure ejection fraction 40 to 45% echo:  She will follow-up with CHF clinic upon discharge.  He is euvolemic.    5.  Essential hypertension: Continue Coreg   DISCHARGE CONDITIONS AND DIET:   stAble for discharge on heart healthy diet  CONSULTS OBTAINED:    DRUG ALLERGIES:   Allergies  Allergen Reactions  . Amlodipine Other (See Comments)    Constipation  Constipation Constipation  .  Benazepril-Hydrochlorothiazide Other (See Comments)    Constipation  Constipation Constipation  . Chlorthalidone Other (See Comments)    Hyponatremia Hyponatremia  . Lisinopril Other (See Comments)  . Other Other (See Comments)    "ANY BLOOD PRESSURE MEDICATIONS THAT I'VE TRIED" - PT. DOES NOT REMEMBER WHICH ONES "ANY BLOOD PRESSURE MEDICATIONS THAT I'VE TRIED"- PT. DOES NOT REMEMBER WHICH ONES- CONSTIPATION    DISCHARGE MEDICATIONS:   Allergies as of 04/02/2019      Reactions   Amlodipine Other (See Comments)   Constipation Constipation Constipation   Benazepril-hydrochlorothiazide Other (See Comments)   Constipation Constipation Constipation   Chlorthalidone Other (See Comments)   Hyponatremia Hyponatremia   Lisinopril Other (See Comments)   Other Other (See Comments)   "ANY BLOOD PRESSURE MEDICATIONS THAT I'VE TRIED" - PT. DOES NOT REMEMBER WHICH ONES "ANY BLOOD PRESSURE MEDICATIONS THAT I'VE TRIED"- PT. DOES NOT REMEMBER WHICH ONES- CONSTIPATION      Medication List    TAKE these medications   albuterol 108 (90 Base) MCG/ACT inhaler Commonly known as:  ProAir HFA Inhale 2 puffs into the lungs every 6 (six) hours as needed for wheezing or shortness of breath. Reported on 04/21/2016   amLODipine 5 MG tablet Commonly known as:  NORVASC Take 5 mg by mouth daily.   aspirin EC 81 MG tablet Take 81 mg by mouth daily.   atorvastatin 40 MG tablet Commonly known as:  LIPITOR Take 40 mg by mouth daily.   carvedilol 12.5 MG tablet Commonly known as:  COREG Take 1 tablet (12.5 mg total) by mouth 2 (two) times daily with a meal. What changed:    medication strength  how much to take   fluticasone-salmeterol 115-21 MCG/ACT inhaler Commonly known as:  ADVAIR HFA Inhale 2 puffs into the lungs 2 (two) times daily.   isosorbide mononitrate 30 MG 24 hr tablet Commonly known as:  IMDUR Take 30 mg by mouth daily.   losartan 25 MG tablet Commonly known as:   COZAAR Take 12.5 mg by mouth daily.   Nitrostat 0.4 MG SL tablet Generic drug:  nitroGLYCERIN Take 0.4 mg by mouth every 5 (five) minutes x 3 doses as needed for chest pain. As needed for chest pain   ticagrelor 90 MG Tabs tablet Commonly known as:  BRILINTA Take 1 tablet (90 mg total) by mouth 2 (two) times daily.         Today   CHIEF COMPLAINT:  Doing well today and without chest pain or shortness of breath.  He has bad knee pain due to severe arthritis.   VITAL SIGNS:  Blood pressure 120/62, pulse (!) 58, temperature 98.7 F (37.1 C), temperature source Oral, resp. rate 19, height 5\' 7"  (1.702 m), weight (!) 140.4 kg, SpO2 94 %.   REVIEW OF SYSTEMS:  Review of Systems  Constitutional: Negative.  Negative for chills, fever and malaise/fatigue.  HENT: Negative.  Negative for ear discharge, ear pain, hearing loss, nosebleeds and sore throat.   Eyes: Negative.  Negative for blurred vision and pain.  Respiratory: Negative.  Negative for cough, hemoptysis, shortness of breath and wheezing.   Cardiovascular: Negative.  Negative for chest pain, palpitations and leg swelling.  Gastrointestinal: Negative.  Negative for abdominal pain, blood in stool, diarrhea, nausea and vomiting.  Genitourinary: Negative.  Negative for dysuria.  Musculoskeletal: Positive for joint pain. Negative for back pain.  Skin: Negative.   Neurological: Negative for dizziness, tremors, speech change, focal weakness, seizures and headaches.  Endo/Heme/Allergies: Negative.  Does not bruise/bleed easily.  Psychiatric/Behavioral: Negative.  Negative for depression, hallucinations and suicidal ideas.     PHYSICAL EXAMINATION:  GENERAL:  85 y.o.-year-old patient lying in the bed with no acute distress.  NECK:  Supple, no jugular venous distention. No thyroid enlargement, no tenderness.  LUNGS: Normal breath sounds bilaterally, no wheezing, rales,rhonchi  No use of accessory muscles of respiration.   CARDIOVASCULAR: S1, S2 normal. No murmurs, rubs, or gallops.  ABDOMEN: Soft, non-tender, non-distended. Bowel sounds present. No organomegaly or mass.  EXTREMITIES: No pedal edema, cyanosis, or clubbing.  PSYCHIATRIC: The patient is alert and oriented x 3.  SKIN: No obvious rash, lesion, or ulcer.   DATA REVIEW:   CBC Recent Labs  Lab 04/02/19 0410  WBC 7.2  HGB 10.1*  HCT 31.0*  PLT 223    Chemistries  Recent Labs  Lab 04/02/19 0410  NA 132*  K 4.4  CL 102  CO2 22  GLUCOSE 96  BUN 32*  CREATININE 2.15*  CALCIUM 8.3*  AST 56*  ALT 15  ALKPHOS 124  BILITOT 0.7    Cardiac Enzymes Recent Labs  Lab 04/01/19 0431 04/01/19 1118 04/01/19 1804  TROPONINI 14.20* 40.61* 25.21*    Microbiology Results  @[email or web address] @  RADIOLOGY:  Dg Chest Port 1 View  Result Date: 04/01/2019 CLINICAL DATA:  Chest pain EXAM: PORTABLE CHEST 1 VIEW COMPARISON:  08/05/2017 FINDINGS: Cardiac shadow is stable. Aortic calcifications are again seen. The lungs are hyperinflated bilaterally. No focal infiltrate or sizable effusion is seen. Very mild vascular congestion is noted centrally. IMPRESSION: Mild vascular congestion.  No focal infiltrate is seen. Electronically Signed   By: Inez Catalina M.D.   On: 04/01/2019 00:43      Allergies as of 04/02/2019      Reactions   Amlodipine Other (See Comments)   Constipation Constipation Constipation   Benazepril-hydrochlorothiazide Other (See Comments)   Constipation Constipation Constipation   Chlorthalidone Other (See Comments)   Hyponatremia Hyponatremia   Lisinopril Other (See Comments)   Other Other (See Comments)   "ANY BLOOD PRESSURE MEDICATIONS THAT I'VE TRIED" - PT. DOES NOT REMEMBER WHICH ONES "ANY BLOOD PRESSURE MEDICATIONS THAT I'VE TRIED"- PT. DOES NOT REMEMBER WHICH ONES- CONSTIPATION      Medication List    TAKE these medications   albuterol 108 (90 Base) MCG/ACT inhaler Commonly known as:  ProAir HFA Inhale 2 puffs  into the lungs every 6 (six) hours as needed for wheezing or shortness of breath. Reported on 04/21/2016   amLODipine 5 MG tablet Commonly known as:  NORVASC Take 5 mg by mouth daily.   aspirin EC 81 MG tablet Take 81 mg by mouth daily.   atorvastatin 40 MG tablet Commonly known as:  LIPITOR Take 40 mg by mouth daily.   carvedilol 12.5 MG tablet Commonly known as:  COREG Take 1 tablet (12.5 mg total) by mouth 2 (two) times daily with a meal. What changed:    medication strength  how much to take   fluticasone-salmeterol 115-21 MCG/ACT inhaler Commonly  known as:  ADVAIR HFA Inhale 2 puffs into the lungs 2 (two) times daily.   isosorbide mononitrate 30 MG 24 hr tablet Commonly known as:  IMDUR Take 30 mg by mouth daily.   losartan 25 MG tablet Commonly known as:  COZAAR Take 12.5 mg by mouth daily.   Nitrostat 0.4 MG SL tablet Generic drug:  nitroGLYCERIN Take 0.4 mg by mouth every 5 (five) minutes x 3 doses as needed for chest pain. As needed for chest pain   ticagrelor 90 MG Tabs tablet Commonly known as:  BRILINTA Take 1 tablet (90 mg total) by mouth 2 (two) times daily.        Aspirin prescribed at discharge?  Yes High Intensity Statin Prescribed? (Lipitor 40-80mg  or Crestor 20-40mg ): Yes Beta Blocker Prescribed? Yes For EF <40%, was ACEI/ARB Prescribed? Yes ADP Receptor Inhibitor Prescribed? (i.e. Plavix etc.-Includes Medically Managed Patients): No: AKI For EF <40%, Aldosterone Inhibitor Prescribed? No: SKIWas EF assessed during THIS hospitalization? Yes Was Cardiac Rehab II ordered? (Included Medically managed Patients): Yes   Management plans discussed with the patient and he is in agreement. Stable for discharge   Patient should follow up with cardiology  CODE STATUS:     Code Status Orders  (From admission, onward)         Start     Ordered   04/01/19 0230  Full code  Continuous     04/01/19 0229        Code Status History    Date Active  Date Inactive Code Status Order ID Comments User Context   07/31/2017 1305 08/02/2017 2215 Full Code [id number]  Saundra Shelling, MD ED   05/27/2017 1923 05/31/2017 1412 Full Code [id number]  Baxter Hire, MD Inpatient   12/01/2016 2042 12/03/2016 1605 Full Code [id number]  Vaughan Basta, MD Inpatient   09/12/2016 0842 09/13/2016 2030 Full Code [id number]  Alonna Buckler, RN Inpatient   09/11/2016 0846 09/11/2016 1659 Full Code [id number]  Dustin Flock, MD ED   08/31/2016 0333 09/06/2016 1714 Full Code [id number]  Saundra Shelling, MD Inpatient    Advance Directive Documentation     Most Recent Value  Type of Advance Directive  Out of facility DNR (pink MOST or yellow form)  Pre-existing out of facility DNR order (yellow form or pink MOST form)  Pink MOST form placed in chart (order not valid for inpatient use) [Pt reports that he does not want chest comprtessions but wants everything else. Dr. Beather Arbour at bedside as witness. Pt does not have a paper for directive. ]  "MOST" Form in Place?  -      TOTAL TIME TAKING CARE OF THIS PATIENT: 38 minutes.    Note: This dictation was prepared with Dragon dictation along with smaller phrase technology. Any transcriptional errors that result from this process are unintentional.  Bettey Costa M.D on 04/02/2019 at 2:11 PM  Between 7am to 6pm - Pager - [phone] After 6pm go to [email or web address] - password [email or web address] [facility]  Office  [phone]  CC: Primary care physician; Marguerita Merles, MD

## 2019-04-02 NOTE — Progress Notes (Signed)
Cardiovascular and Pulmonary Nurse Navigator Note:    84 year old male with h/o CAD, chronic systolic HF, and CKD III, COPD, HTN, bipolar affective disorder, chronic combined diastolic and systolic CHF, HLD, PVD, and prostate cancer who was admitted with inferior STEMI s/p primary PCI 04/01/2019.    Panel Physicians Referring Physician Case Authorizing Physician  End, Harrell Gave, MD (Primary)    Procedures   CORONARY ANGIOGRAPHY  Coronary/Graft Acute MI Revascularization  Conclusion   Conclusions: 1. Severe single-vessel coronary artery disease with sequential 80-95% proximal and mid RCA stenoses with heavy thrombus causing inferior STEMI. 2. Mild, non-obstructive coronary artery disease involving the left coronary artery. 3. Patent distal RCA Taxus stent with mild in-stent restenosis. 4. Challenging but ultimately successful PCI to the proximal and mid RCA lesions with non-overlapping Resolute Onyx 4.0 x 26 mm (proximal - dilated to 4.4 mm) and 4.0 x 15 mm (distal - dilated to 4.2 mm) drug-eluting stents with 0% residual stenosis and TIMI-3 flow.  Recommendations: 1. Admit to ICU, hospitalist service, for post-STEMI monitoring. 2. Dual antiplatelet therapy with aspirin and ticagrelor for at least 12 months, ideally longer given multiple stents in the RCA, including an old Taxus stent. 3. Gentle post-catheterization hydration, given chronic kidney disease and contrast use during today's case. 4. Obtain transthoracic echocardiogram for evaluation of LVEF and aortic stenosis. 5. Aggressive secondary prevention.  Nelva Bush, MD [facility] Pager: [phone]   Echo performed on 04/01/2019 with EF 45-50%.    Rounded on patient.  Patient sitting up in recliner chair watching television.   "Heart Attack Bouncing Back" booklet given and reviewed with patient. Discussed the definition of CAD. Reviewed the location of CAD and where his stent was placed. Informed patient he will be  given a stent card. Explained the purpose of the stent card. Instructed patient to keep stent card in his wallet.  ? Discussed modifiable risk factors including controlling blood pressure, cholesterol, and blood sugar; following heart healthy diet; maintaining healthy weight; exercise; and smoking cessation, if applicable.   ? Discussed cardiac medications including rationale for taking, mechanisms of action, and side effects. Stressed the importance of taking medications as prescribed.  ? Discussed emergency plan for heart attack symptoms. Patient verbalized understanding of need to call 911 and not to drive himself to ER if having cardiac symptoms / chest pain.  ? Diet of low sodium, low fat, low cholesterol heart healthy diet discussed. Information on diet provided.  Patient informed this RN that he does his own cooking and does not add salt.   Note: Dietitian Consultation for diet education.    Patient reported he has a scale, but does not weigh daily.  Stressed the importance of weighing daily and to notify cardiologist for weight gain of 2 pounds overnight or 5 pounds in one week, increasing SOB, swelling of feet, legs and / or abdomen.   ? Smoking Cessation - Patient is a FORMER smoker.   ? Exercise - Benefits of exercised discussed. Patient reported that he does not exercise, as he has arthritis and bad knees.   Informed patient that his cardiologist has referred him to outpatient Cardiac Rehab - an education and exercise program.  Patient stated, "With these knees I would not be able to exercise."  Patient declined to participate in Cardiac Rehab.  Cardiac Rehab Referral closed.   Note:  PT has recommended STR for this patient. RN / SW working on bed offers.    Patient to follow-up with Dr.  End in one week.    Patient appreciative of the information.  ? Roanna Epley, RN, BSN, [place]  [facility] Cardiac & Pulmonary Rehab  Cardiovascular & Pulmonary Nurse Navigator  Direct Line:  [phone]  Department Phone #: [phone] Fax: [phone]  Email Address: [email or web address]

## 2019-04-02 NOTE — TOC Initial Note (Signed)
Transition of Care [facility]) - Initial/Assessment Note    Patient Details  Name: [name] MRN: [id number] Date of Birth: [birth year]  Transition of Care [facility]) CM/SW Contact:    Nicholas Rafter, RN Phone Number: 04/02/2019, 11:06 AM  Clinical Narrative:    Patient is from home alone.  Lives in an apartment building on the 4th floor.  Current with PCP; Obtains medications at [facility] without difficulty.  Currently on room air.  PT evaluation completed today.  Difficult to transfer to chair from bed.  Spoke with patient about SNF.  He and daughter [name] [phone] are willing to go to SNF.  Will notify he and daughter of bed offers.             Expected Discharge Plan: Skilled Nursing Facility Barriers to Discharge: (Pending SNF insurance authorization)   Patient Goals and CMS Choice Patient states their goals for this hospitalization and ongoing recovery are:: would like STR; no preference CMS Medicare.gov Compare Post Acute Care list provided to:: Patient Represenative (must comment)(daughter [name])    Expected Discharge Plan and Services Expected Discharge Plan: [place]   Discharge Planning Services: CM Consult Post Acute Care Choice: [place] Living arrangements for the past 2 months: Apartment Expected Discharge Date: 04/02/19                        Prior Living Arrangements/Services Living arrangements for the past 2 months: Apartment Lives with:: Self Patient language and need for interpreter reviewed:: Yes Do you feel safe going back to the place where you live?: Yes            Criminal Activity/Legal Involvement Pertinent to Current Situation/Hospitalization: No - Comment as needed  Activities of Daily Living Home Assistive Devices/Equipment: None ADL Screening (condition at time of admission) Patient's cognitive ability adequate to safely complete daily activities?: Yes Is the patient deaf or have  difficulty hearing?: No Does the patient have difficulty seeing, even when wearing glasses/contacts?: No Does the patient have difficulty concentrating, remembering, or making decisions?: No Patient able to express need for assistance with ADLs?: Yes Does the patient have difficulty dressing or bathing?: No Independently performs ADLs?: Yes (appropriate for developmental age) Does the patient have difficulty walking or climbing stairs?: No Weakness of Legs: None Weakness of Arms/Hands: None  Permission Sought/Granted Permission sought to share information with : Facility Art therapist granted to share information with : Yes, Verbal Permission Granted     Permission granted to share info w AGENCY: SNF agencies        Emotional Assessment Appearance:: Appears older than stated age Attitude/Demeanor/Rapport: Gracious Affect (typically observed): Accepting Orientation: : Oriented to Self, Oriented to Place, Oriented to  Time, Oriented to Situation Alcohol / Substance Use: Not Applicable    Admission diagnosis:  ST elevation myocardial infarction (STEMI), unspecified artery ([place]) [I21.3] STEMI involving right coronary artery ([place]) [I21.11] Patient Active Problem List   Diagnosis Date Noted  . STEMI (ST elevation myocardial infarction) ([place]) 04/01/2019  . STEMI involving right coronary artery ([place]) 04/01/2019  . Acute ST elevation myocardial infarction (STEMI) ([place]) 04/01/2019  . Lower extremity pain, bilateral 05/15/2018  . Elevated troponin I level 07/31/2017  . Hyperkalemia 05/28/2017  . Syncope 05/27/2017  . Meningioma () 01/05/2017  . GI bleed 12/01/2016  . Alcohol abuse 10/30/2016  . Chronic systolic heart failure ([place]) 10/04/2016  . COPD (chronic obstructive pulmonary disease) with chronic bronchitis ([place])  10/04/2016  . Obstructive sleep apnea 10/04/2016  . Hyponatremia 08/31/2016  . UTI (lower urinary tract infection) 08/31/2016  . Exertional  dyspnea 02/11/2016  . Hyperlipidemia 12/03/2015  . Essential hypertension   . Coronary artery disease 01/19/2004   PCP:  Marguerita Merles, MD Pharmacy:   [place], [place] - [address] [address] Phone: [phone] Fax: [phone]     Social Determinants of Health (SDOH) Interventions    Readmission Risk Interventions No flowsheet data found.

## 2019-04-02 NOTE — Progress Notes (Signed)
Pt complaining of constipation, has not had a BM since Sunday. Pt normally goes every day & states he feels constipated, pt states he also normally takes magnesium citrate at home if he is constipated. MD paged, Dr. Sidney Ace gave orders to give a one time dose of half a bottle of magnesium citrate.   Conley Simmonds, RN, BSN

## 2019-04-02 NOTE — Plan of Care (Signed)
  Problem: Clinical Measurements: Goal: Respiratory complications will improve Outcome: Progressing Note:  No weaned to room air sats 92%   Problem: Coping: Goal: Level of anxiety will decrease Outcome: Progressing   Problem: Elimination: Goal: Will not experience complications related to urinary retention Outcome: Progressing   Problem: Pain Managment: Goal: General experience of comfort will improve Outcome: Progressing Note:  No complaints of pain this shift   Problem: Safety: Goal: Ability to remain free from injury will improve Outcome: Progressing   Problem: Education: Goal: Knowledge of General Education information will improve Description Including pain rating scale, medication(s)/side effects and non-pharmacologic comfort measures Outcome: Completed/Met   Problem: Cardiovascular: Goal: Vascular access site(s) Level 0-1 will be maintained Outcome: Completed/Met Note:  Right radial wrist site level 0, no bleeding/bruising, no current dressing   Problem: Elimination: Goal: Will not experience complications related to bowel motility Note:  Magnesium citrate given for constipation

## 2019-04-02 NOTE — Telephone Encounter (Signed)
TCM....  Patient is being discharged      They are scheduled to see Arida on 4/23 at 42 for virtual visit consent pending   They were seen for STEMI  They need to be seen within 2 wks     Please call

## 2019-04-02 NOTE — Telephone Encounter (Signed)
error 

## 2019-04-03 NOTE — Telephone Encounter (Signed)
Spoke with [name] regarding his Webex visit April23, 2020 with Dr. Fletcher Anon. The patient stated, he is at [facility] facility s/p MI for 6 more days. The patient stated, we need to contact his daughter [name]) to set up any information for a TeleHealth visit.   Spoke with [name] (daughter) she stated, she will let us know next Wed, April 22 nd if he will continue more time at the facility or if he will be home.

## 2019-04-04 NOTE — Telephone Encounter (Signed)
Attempted to reach the patient for a TCM call. The connection was not good and was not able to hear the patient. Will attempt to call back later.

## 2019-04-07 NOTE — Telephone Encounter (Signed)
Called and spoke to patient. He is currently in the nursing home.  He stated "I want to get out of this place. Please get out of this place." He does not like to be in the nursing home. He is at [facility] right now. He says he has not been able to get in touch with his daughter.  I told him he had a virtual visit with the cardiologist this week. I told him I would try to get in touch with his daughter to let her know he is not happy in the nursing home.   Attempted to reach daughter. No answer. Left message to call us back.

## 2019-04-09 ENCOUNTER — Telehealth: Payer: Self-pay

## 2019-04-09 NOTE — Telephone Encounter (Signed)

## 2019-04-10 ENCOUNTER — Encounter: Payer: Self-pay | Admitting: Cardiovascular Disease

## 2019-04-10 ENCOUNTER — Other Ambulatory Visit: Payer: Self-pay

## 2019-04-10 ENCOUNTER — Telehealth (INDEPENDENT_AMBULATORY_CARE_PROVIDER_SITE_OTHER): Payer: Medicare HMO | Admitting: Cardiovascular Disease

## 2019-04-10 ENCOUNTER — Telehealth: Payer: Self-pay | Admitting: *Deleted

## 2019-04-10 VITALS — Ht 69.0 in | Wt 304.0 lb

## 2019-04-10 DIAGNOSIS — I2111 ST elevation (STEMI) myocardial infarction involving right coronary artery: Secondary | ICD-10-CM | POA: Diagnosis not present

## 2019-04-10 MED ORDER — ASPIRIN EC 81 MG PO TBEC: 81.0000 mg | DELAYED_RELEASE_TABLET | Freq: Every day | ORAL | 11 refills | Status: DC

## 2019-04-10 MED ORDER — ISOSORBIDE MONONITRATE ER 30 MG PO TB24: 30.0000 mg | ORAL_TABLET | Freq: Every day | ORAL | 1 refills | Status: DC

## 2019-04-10 MED ORDER — ATORVASTATIN CALCIUM 40 MG PO TABS: 40.0000 mg | ORAL_TABLET | Freq: Every day | ORAL | 1 refills | Status: DC

## 2019-04-10 MED ORDER — TICAGRELOR 90 MG PO TABS: 90.0000 mg | ORAL_TABLET | Freq: Two times a day (BID) | ORAL | 11 refills | Status: DC

## 2019-04-10 MED ORDER — LOSARTAN POTASSIUM 25 MG PO TABS: 12.5000 mg | ORAL_TABLET | Freq: Every day | ORAL | 1 refills | Status: DC

## 2019-04-10 MED ORDER — AMLODIPINE BESYLATE 5 MG PO TABS: 5.0000 mg | ORAL_TABLET | Freq: Every day | ORAL | 1 refills | Status: DC

## 2019-04-10 NOTE — Telephone Encounter (Signed)
Incoming call from Hunter NP at [facility]. She stated that the patient was recently home from inpatient rehab on Monday. She stated that he did not receive an updated medication list and was therefore taking the medications as prescribed prior to his hospital visit. He was not taking aspirin so she asked him to take a 325 mg.  She also stated that his lung sounds were diminished upon ausculation and he has been having thickening secretions. She has ordered an at home chest x-ray and will call back with those results.   The patient has a telehealth visit today with Dr. Fletcher Anon. Medications will be updated at that point.

## 2019-04-10 NOTE — Patient Instructions (Addendum)
Medication Instructions:  These medications have been filled for you and will be delivered to your home.   Take: Aspirin 81 mg tablet once daily Brilinta 90 mg tablet twice a day Atorvastatin 40 mg once daily Losartan take half a tablet, 12.5 mg, once daily Amlodipine 5 mg tablet once daily. Isosorbide Mononitrate 30 mg tablet once daily. Carvedilol 12.5 mg tablet twice daily-was already sent in on 04/02/2019  If you need a refill on your cardiac medications before your next appointment, please call your pharmacy.   Lab work: None ordered  Testing/Procedures: None ordered  Follow-Up: An office appointment has been made for 04/15/2019 at 10 am at [facility] Your daughter has been made aware of this appointment and will bring you. Please call [phone] with any questions and concerns.

## 2019-04-10 NOTE — Progress Notes (Signed)
Virtual Visit via Telephone Note   This visit type was conducted due to national recommendations for restrictions regarding the COVID-19 Pandemic (e.g. social distancing) in an effort to limit this patient's exposure and mitigate transmission in our community.  Due to his co-morbid illnesses, this patient is at least at moderate risk for complications without adequate follow up.  This format is felt to be most appropriate for this patient at this time.  The patient did not have access to video technology/had technical difficulties with video requiring transitioning to audio format only (telephone).  All issues noted in this document were discussed and addressed.  No physical exam could be performed with this format.  Please refer to the patient's chart for his  consent to telehealth for [facility].   Evaluation Performed:  Follow-up visit  Date:  04/10/2019   ID:  [name], DOB October 04, 1935, MRN [id number]  Patient Location: Home Provider Location: Office  PCP:  Marguerita Merles, MD  Cardiologist:  Kathlyn Sacramento, MD  Electrophysiologist:  None   Chief Complaint: Follow-up visit regarding recent myocardial infarction  History of Present Illness:    [name] is a 84 y.o. male who was reached via phone today to follow-up regarding recent myocardial infarction. He is status post Taxus drug-eluting stent placement to the distal right coronary artery in 2005.  He has extensive medical problems that include borderline diabetes, hypertension, hyperlipidemia, chronic systolic/diastolic heart failure , sleep apnea not on CPAP, morbid obesity, previous tobacco use,  stage III CKD, bipolar disorder and peripheral arterial disease. Echocardiogram in August, 2018 showed an EF of 40-45%, mild to moderate aortic stenosis, mildly dilated right and left atrium. He had a nuclear stress test done in August 2018 for chest pain which showed no evidence of ischemia. He was hospitalized on the 14th of  this month with inferior ST elevation myocardial infarction.  Cardiac catheterization showed 90% proximal and mid RCA disease which was treated with non-overlapping drug-eluting stents.  The distal RCA stent was patent with mild in-stent restenosis.  Echocardiogram showed an EF of 45 to 50% with severe inferior wall hypokinesis.  There was evidence of aortic stenosis but did not appear to be severe.  The valve was not fully interrogated. He was discharged to inpatient rehab but he did not like it there and signed himself out.  He is home now but does not have Brilinta and he is not sure if he was given that during his rehab stay.  He complains of chest pain under the right rib cage.  He has stable dyspnea. The patient does not drive but his daughter helps him.  I had hard time communicating with him over the phone.  The patient does not have symptoms concerning for COVID-19 infection (fever, chills, cough, or new shortness of breath).    Past Medical History:  Diagnosis Date  . Alcoholism ([place])   . Asthma   . Bipolar affective disorder ([place])   . Chronic combined systolic and diastolic CHF (congestive heart failure) ([place])    a. 08/2016 Echo: EF 40-45%, mild AS, mild to mod MR, mildly dil LA/RA, mild-mod TR; b. 07/2017 Echo: EF 40-45%, mod LVH, Gr1 DD, mild to mod AS, mildly dil LA, nl RV fxn; c. 03/2019 Echo: EF 45-50%, AS (not severe). Mod dil PA.  . CKD (chronic kidney disease), stage III ([place])   . COPD (chronic obstructive pulmonary disease) ([place])   . Coronary artery disease    a. 01/2004  s/p PCI and Taxus DES to dRCA (3.5 x 12 mm); b. 07/2017 Lexiscan MV: no ischemia. Sm area of apicl thinning, likely attenuation. EF 33% (GI uptake noted)-->Low risk; c. 03/2019 Inf STEMI/PCI: LM nl, LAD min irregs, D1 20ost, RI 20ost, LCX nl, RCA 90p/91m (4.0x26 Resolute Onyx DES), 83m (4.0x15 Resolute Onyx DES), 10d ISR.  Marland Kitchen Degenerative joint disease   . Essential hypertension   . Hyperlipidemia   . Hypertension    . Ischemic cardiomyopathy    a. 08/2016 Echo: EF 40-45%; b. 07/2017 Echo: EF 40-45%; c. 03/2019 Echo: EF 45-50%.  . Prostate CA ([place])    prostate ca dx 20 yrs ago  . PVD (peripheral vascular disease) ([place])   . Tobacco abuse    Past Surgical History:  Procedure Laterality Date  . CARDIAC CATHETERIZATION    . CORONARY ANGIOGRAPHY N/A 04/01/2019   Procedure: CORONARY ANGIOGRAPHY;  Surgeon: Nelva Bush, MD;  Location: [place] [facility];  Service: Cardiovascular;  Laterality: N/A;  . CORONARY ANGIOPLASTY WITH STENT PLACEMENT    . CORONARY/GRAFT ACUTE MI REVASCULARIZATION N/A 04/01/2019   Procedure: Coronary/Graft Acute MI Revascularization;  Surgeon: Nelva Bush, MD;  Location: [place] [facility];  Service: Cardiovascular;  Laterality: N/A;  . TOTAL HIP ARTHROPLASTY     right     Current Meds  Medication Sig  . albuterol (PROAIR HFA) 108 (90 Base) MCG/ACT inhaler Inhale 2 puffs into the lungs every 6 (six) hours as needed for wheezing or shortness of breath. Reported on 04/21/2016  . amLODipine (NORVASC) 5 MG tablet Take 5 mg by mouth daily.   Marland Kitchen atorvastatin (LIPITOR) 40 MG tablet Take 40 mg by mouth daily.  . carvedilol (COREG) 12.5 MG tablet Take 1 tablet (12.5 mg total) by mouth 2 (two) times daily with a meal.  . fluticasone-salmeterol (ADVAIR HFA) 115-21 MCG/ACT inhaler Inhale 2 puffs into the lungs 2 (two) times daily.  . isosorbide mononitrate (IMDUR) 30 MG 24 hr tablet Take 30 mg by mouth daily.  Marland Kitchen losartan (COZAAR) 25 MG tablet Take 12.5 mg by mouth daily.  . nitroGLYCERIN (NITROSTAT) 0.4 MG SL tablet Take 0.4 mg by mouth every 5 (five) minutes x 3 doses as needed for chest pain. As needed for chest pain  . ticagrelor (BRILINTA) 90 MG TABS tablet Take 1 tablet (90 mg total) by mouth 2 (two) times daily.     Allergies:   Amlodipine; Benazepril-hydrochlorothiazide; Chlorthalidone; Lisinopril; and Other   Social History   Tobacco Use  . Smoking status: Former Smoker     Packs/day: 1.00    Years: 50.00    Pack years: 50.00    Types: Cigarettes  . Smokeless tobacco: Never Used  Substance Use Topics  . Alcohol use: Yes    Comment: social   . Drug use: No     Family Hx: The patient's family history includes Heart attack in his father and mother; Hyperlipidemia in his father and mother; Hypertension in his father and mother. There is no history of Prostate cancer, Bladder Cancer, or Kidney cancer.  ROS:   Please see the history of present illness.     All other systems reviewed and are negative.   Prior CV studies:   The following studies were reviewed today:  I reviewed recent cardiac catheterization and echocardiogram.  Labs/Other Tests and Data Reviewed:    EKG:  No ECG reviewed.  Recent Labs: 04/02/2019: ALT 15; BUN 32; Creatinine, Ser 2.15; Hemoglobin 10.1; Platelets 223; Potassium 4.4; Sodium 132  Recent Lipid Panel Lab Results  Component Value Date/Time   CHOL 104 04/01/2019 04:31 AM   CHOL 189 04/26/2016 08:10 AM   CHOL 138 05/31/2014 04:16 AM   TRIG 30 04/01/2019 04:31 AM   TRIG 75 05/31/2014 04:16 AM   HDL 43 04/01/2019 04:31 AM   HDL 66 04/26/2016 08:10 AM   HDL 40 05/31/2014 04:16 AM   CHOLHDL 2.4 04/01/2019 04:31 AM   LDLCALC 55 04/01/2019 04:31 AM   LDLCALC 97 04/26/2016 08:10 AM   LDLCALC 83 05/31/2014 04:16 AM    Wt Readings from Last 3 Encounters:  04/10/19 (!) 304 lb (137.9 kg)  04/02/19 (!) 309 lb 8 oz (140.4 kg)  08/16/18 (!) 301 lb 8 oz (136.8 kg)     Objective:    Vital Signs:  Ht 5\' 9"  (1.753 m)   Wt (!) 304 lb (137.9 kg)   BMI 44.89 kg/m      ASSESSMENT & PLAN:    1. Recent inferior ST elevation myocardial infarction: Treated successfully with 2 drug-eluting stent placement to the right coronary artery.  Previously placed stent in the distal right coronary artery was patent.  The patient seems to be really confused about his medications and likely does not have Brilinta at home.  We are going to  send him a prescription and make sure he has it.  He is taking aspirin 81 mg daily and has other medications.  I stressed the importance of taking medications regularly.  As I had some difficulty communicating with him over the phone, we will plan on bringing him to the office in about 1 week.  The patient is at high risk for readmission. 2. Chronic systolic heart failure due to ischemic cardiomyopathy: Mildly reduced ejection fraction.  Continue carvedilol and losartan.  We have to monitor renal function closely.  I will plan on doing basic metabolic profile next week. 3. Hyperlipidemia: Continue atorvastatin with a target LDL of less than 70. 4. Essential hypertension: Continue same medications but will have to bring him an and check this.  COVID-19 Education: The signs and symptoms of COVID-19 were discussed with the patient and how to seek care for testing (follow up with PCP or arrange E-visit).  The importance of social distancing was discussed today.  Time:   Today, I have spent 25 minutes with the patient with telehealth technology discussing the above problems.     Medication Adjustments/Labs and Tests Ordered: Current medicines are reviewed at length with the patient today.  Concerns regarding medicines are outlined above.   Tests Ordered: No orders of the defined types were placed in this encounter.   Medication Changes: No orders of the defined types were placed in this encounter.   Disposition:  Follow up in 1 week(s)  Signed, Kathlyn Sacramento, MD  04/10/2019 10:44 AM    [place]

## 2019-04-12 ENCOUNTER — Inpatient Hospital Stay
Admission: EM | Admit: 2019-04-12 | Discharge: 2019-04-15 | DRG: 291 | Disposition: A | Discharge status: Home Health | Payer: Medicare HMO | Attending: Internal Medicine | Admitting: Internal Medicine

## 2019-04-12 ENCOUNTER — Emergency Department: Payer: Medicare HMO

## 2019-04-12 ENCOUNTER — Other Ambulatory Visit: Payer: Self-pay

## 2019-04-12 ENCOUNTER — Encounter: Payer: Self-pay | Admitting: Emergency Medicine

## 2019-04-12 DIAGNOSIS — I739 Peripheral vascular disease, unspecified: Secondary | ICD-10-CM | POA: Diagnosis present

## 2019-04-12 DIAGNOSIS — Z7902 Long term (current) use of antithrombotics/antiplatelets: Secondary | ICD-10-CM | POA: Diagnosis not present

## 2019-04-12 DIAGNOSIS — Z7982 Long term (current) use of aspirin: Secondary | ICD-10-CM

## 2019-04-12 DIAGNOSIS — E8779 Other fluid overload: Secondary | ICD-10-CM

## 2019-04-12 DIAGNOSIS — I5023 Acute on chronic systolic (congestive) heart failure: Secondary | ICD-10-CM

## 2019-04-12 DIAGNOSIS — Z8349 Family history of other endocrine, nutritional and metabolic diseases: Secondary | ICD-10-CM | POA: Diagnosis not present

## 2019-04-12 DIAGNOSIS — Z7951 Long term (current) use of inhaled steroids: Secondary | ICD-10-CM

## 2019-04-12 DIAGNOSIS — Z888 Allergy status to other drugs, medicaments and biological substances status: Secondary | ICD-10-CM

## 2019-04-12 DIAGNOSIS — R0602 Shortness of breath: Secondary | ICD-10-CM | POA: Diagnosis present

## 2019-04-12 DIAGNOSIS — Z20828 Contact with and (suspected) exposure to other viral communicable diseases: Secondary | ICD-10-CM | POA: Diagnosis present

## 2019-04-12 DIAGNOSIS — Z955 Presence of coronary angioplasty implant and graft: Secondary | ICD-10-CM

## 2019-04-12 DIAGNOSIS — I130 Hypertensive heart and chronic kidney disease with heart failure and stage 1 through stage 4 chronic kidney disease, or unspecified chronic kidney disease: Secondary | ICD-10-CM | POA: Diagnosis present

## 2019-04-12 DIAGNOSIS — G4733 Obstructive sleep apnea (adult) (pediatric): Secondary | ICD-10-CM | POA: Diagnosis present

## 2019-04-12 DIAGNOSIS — Z87891 Personal history of nicotine dependence: Secondary | ICD-10-CM | POA: Diagnosis not present

## 2019-04-12 DIAGNOSIS — Z6841 Body Mass Index (BMI) 40.0 and over, adult: Secondary | ICD-10-CM

## 2019-04-12 DIAGNOSIS — J9601 Acute respiratory failure with hypoxia: Secondary | ICD-10-CM | POA: Diagnosis present

## 2019-04-12 DIAGNOSIS — D649 Anemia, unspecified: Secondary | ICD-10-CM | POA: Diagnosis present

## 2019-04-12 DIAGNOSIS — Z79899 Other long term (current) drug therapy: Secondary | ICD-10-CM

## 2019-04-12 DIAGNOSIS — E785 Hyperlipidemia, unspecified: Secondary | ICD-10-CM | POA: Diagnosis present

## 2019-04-12 DIAGNOSIS — I2510 Atherosclerotic heart disease of native coronary artery without angina pectoris: Secondary | ICD-10-CM | POA: Diagnosis present

## 2019-04-12 DIAGNOSIS — J449 Chronic obstructive pulmonary disease, unspecified: Secondary | ICD-10-CM | POA: Diagnosis present

## 2019-04-12 DIAGNOSIS — N183 Chronic kidney disease, stage 3 (moderate): Secondary | ICD-10-CM | POA: Diagnosis present

## 2019-04-12 DIAGNOSIS — Z8249 Family history of ischemic heart disease and other diseases of the circulatory system: Secondary | ICD-10-CM

## 2019-04-12 DIAGNOSIS — I255 Ischemic cardiomyopathy: Secondary | ICD-10-CM | POA: Diagnosis present

## 2019-04-12 DIAGNOSIS — I252 Old myocardial infarction: Secondary | ICD-10-CM

## 2019-04-12 DIAGNOSIS — I5043 Acute on chronic combined systolic (congestive) and diastolic (congestive) heart failure: Secondary | ICD-10-CM | POA: Diagnosis present

## 2019-04-12 DIAGNOSIS — R0600 Dyspnea, unspecified: Secondary | ICD-10-CM | POA: Diagnosis not present

## 2019-04-12 LAB — CBC
HCT: 29 % — ABNORMAL LOW (ref 39.0–52.0)
Hemoglobin: 9.5 g/dL — ABNORMAL LOW (ref 13.0–17.0)
MCH: 27.1 pg (ref 26.0–34.0)
MCHC: 32.8 g/dL (ref 30.0–36.0)
MCV: 82.9 fL (ref 80.0–100.0)
Platelets: 246 10*3/uL (ref 150–400)
RBC: 3.5 MIL/uL — ABNORMAL LOW (ref 4.22–5.81)
RDW: 14.6 % (ref 11.5–15.5)
WBC: 6.5 10*3/uL (ref 4.0–10.5)
nRBC: 0 % (ref 0.0–0.2)

## 2019-04-12 LAB — TROPONIN I: Troponin I: 0.03 ng/mL (ref <0.03)

## 2019-04-12 LAB — BASIC METABOLIC PANEL
Anion gap: 9 (ref 5–15)
BUN: 29 mg/dL — ABNORMAL HIGH (ref 8–23)
CO2: 20 mmol/L — ABNORMAL LOW (ref 22–32)
Calcium: 8 mg/dL — ABNORMAL LOW (ref 8.9–10.3)
Chloride: 104 mmol/L (ref 98–111)
Creatinine, Ser: 2.03 mg/dL — ABNORMAL HIGH (ref 0.61–1.24)
GFR calc Af Amer: 34 mL/min — ABNORMAL LOW (ref >60)
GFR calc non Af Amer: 30 mL/min — ABNORMAL LOW (ref >60)
Glucose, Bld: 107 mg/dL — ABNORMAL HIGH (ref 70–99)
Potassium: 4.3 mmol/L (ref 3.5–5.1)
Sodium: 133 mmol/L — ABNORMAL LOW (ref 135–145)

## 2019-04-12 LAB — SARS CORONAVIRUS 2 BY RT PCR (HOSPITAL ORDER, PERFORMED IN ~~LOC~~ HOSPITAL LAB): SARS Coronavirus 2: NEGATIVE

## 2019-04-12 LAB — PROCALCITONIN: Procalcitonin: <0.1 ng/mL

## 2019-04-12 LAB — BRAIN NATRIURETIC PEPTIDE: B Natriuretic Peptide: 182 pg/mL — ABNORMAL HIGH (ref 0.0–100.0)

## 2019-04-12 MED ORDER — ASPIRIN EC 81 MG PO TBEC
81.0000 mg | DELAYED_RELEASE_TABLET | Freq: Every day | ORAL | Status: DC
  Administered 2019-04-13 – 2019-04-15 (×3): 81 mg via ORAL
  Filled 2019-04-12 (×3): qty 1

## 2019-04-12 MED ORDER — AMLODIPINE BESYLATE 5 MG PO TABS
5.0000 mg | ORAL_TABLET | Freq: Every day | ORAL | Status: DC
  Administered 2019-04-13 – 2019-04-14 (×2): 5 mg via ORAL
  Filled 2019-04-12 (×2): qty 1

## 2019-04-12 MED ORDER — ONDANSETRON HCL 4 MG PO TABS: 4.0000 mg | ORAL_TABLET | Freq: Four times a day (QID) | ORAL | Status: DC | PRN

## 2019-04-12 MED ORDER — FUROSEMIDE 10 MG/ML IJ SOLN
80.0000 mg | Freq: Once | INTRAMUSCULAR | Status: AC
  Administered 2019-04-12: 12:00:00 80 mg via INTRAVENOUS
  Filled 2019-04-12 (×2): qty 8

## 2019-04-12 MED ORDER — TICAGRELOR 90 MG PO TABS
90.0000 mg | ORAL_TABLET | Freq: Two times a day (BID) | ORAL | Status: AC
  Administered 2019-04-12 – 2019-04-13 (×3): 90 mg via ORAL
  Filled 2019-04-12 (×3): qty 1

## 2019-04-12 MED ORDER — FUROSEMIDE 10 MG/ML IJ SOLN
40.0000 mg | Freq: Two times a day (BID) | INTRAMUSCULAR | Status: DC
  Administered 2019-04-13: 09:00:00 40 mg via INTRAVENOUS
  Filled 2019-04-12: qty 4

## 2019-04-12 MED ORDER — ONDANSETRON HCL 4 MG/2ML IJ SOLN: 4.0000 mg | Freq: Four times a day (QID) | INTRAMUSCULAR | Status: DC | PRN

## 2019-04-12 MED ORDER — CARVEDILOL 12.5 MG PO TABS
12.5000 mg | ORAL_TABLET | Freq: Two times a day (BID) | ORAL | Status: DC
  Administered 2019-04-13 – 2019-04-15 (×4): 12.5 mg via ORAL
  Filled 2019-04-12 (×5): qty 1

## 2019-04-12 MED ORDER — POLYETHYLENE GLYCOL 3350 17 G PO PACK: 17.0000 g | PACK | Freq: Every day | ORAL | Status: DC | PRN

## 2019-04-12 MED ORDER — MOMETASONE FURO-FORMOTEROL FUM 200-5 MCG/ACT IN AERO
2.0000 | INHALATION_SPRAY | Freq: Two times a day (BID) | RESPIRATORY_TRACT | Status: DC
  Administered 2019-04-12 – 2019-04-15 (×6): 2 via RESPIRATORY_TRACT
  Filled 2019-04-12: qty 8.8

## 2019-04-12 MED ORDER — ACETAMINOPHEN 650 MG RE SUPP: 650.0000 mg | Freq: Four times a day (QID) | RECTAL | Status: DC | PRN

## 2019-04-12 MED ORDER — LOSARTAN POTASSIUM 25 MG PO TABS
12.5000 mg | ORAL_TABLET | Freq: Every day | ORAL | Status: DC
  Administered 2019-04-13 – 2019-04-15 (×3): 12.5 mg via ORAL
  Filled 2019-04-12 (×3): qty 1

## 2019-04-12 MED ORDER — ISOSORBIDE MONONITRATE ER 30 MG PO TB24
30.0000 mg | ORAL_TABLET | Freq: Every day | ORAL | Status: DC
  Administered 2019-04-13 – 2019-04-15 (×3): 30 mg via ORAL
  Filled 2019-04-12 (×3): qty 1

## 2019-04-12 MED ORDER — NITROGLYCERIN 0.4 MG SL SUBL: 0.4000 mg | SUBLINGUAL_TABLET | SUBLINGUAL | Status: DC | PRN

## 2019-04-12 MED ORDER — ALBUTEROL SULFATE HFA 108 (90 BASE) MCG/ACT IN AERS: 2.0000 | INHALATION_SPRAY | Freq: Four times a day (QID) | RESPIRATORY_TRACT | Status: DC | PRN

## 2019-04-12 MED ORDER — ATORVASTATIN CALCIUM 20 MG PO TABS
40.0000 mg | ORAL_TABLET | Freq: Every day | ORAL | Status: DC
  Administered 2019-04-13 – 2019-04-14 (×2): 40 mg via ORAL
  Filled 2019-04-12 (×2): qty 2

## 2019-04-12 MED ORDER — ENOXAPARIN SODIUM 40 MG/0.4ML ~~LOC~~ SOLN
40.0000 mg | SUBCUTANEOUS | Status: DC
  Administered 2019-04-12 – 2019-04-14 (×3): 40 mg via SUBCUTANEOUS
  Filled 2019-04-12 (×3): qty 0.4

## 2019-04-12 MED ORDER — ACETAMINOPHEN 325 MG PO TABS
650.0000 mg | ORAL_TABLET | Freq: Four times a day (QID) | ORAL | Status: DC | PRN
  Administered 2019-04-12 – 2019-04-13 (×2): 650 mg via ORAL
  Filled 2019-04-12 (×2): qty 2

## 2019-04-12 MED ORDER — IPRATROPIUM-ALBUTEROL 0.5-2.5 (3) MG/3ML IN SOLN: 3.0000 mL | Freq: Four times a day (QID) | RESPIRATORY_TRACT | Status: DC | PRN

## 2019-04-12 NOTE — ED Notes (Signed)
Date and time results received: 04/12/19 11:20 AM   Test: troponin Critical Value: 0.03  Name of Provider Notified: Jacqualine Code

## 2019-04-12 NOTE — Consult Note (Signed)
Cardiology Consultation:   Patient ID: [name] MRN: [id number]; DOB: [birth year]  Admit date: 04/12/2019 Date of Consult: 04/12/2019  Primary Care Provider: Marguerita Merles, MD Primary Cardiologist: Kathlyn Sacramento, MD     Patient Profile:   [name] is a 84 y.o. male with a hx of CAD who is being seen today for the evaluation of SOB  at the request of Dr Jacqualine Code  History of Present Illness:   [name] is an 84 yo with hx of COPD, CAD (s/p TAXUS stent to RCA 8315), systolic/diastolic VVO(1607 echo LVEF 40 to 45% with mild to moderate AS)   Patient also has a history of  CKD Stage III, HL, HTN, tobacco abuse, EtOHism, PVOD and  Asthma  The pt was recently admitted to [facility] (4/14- 04/02/19) for CP and STEMI  L heart catheterization 4/14 showed LAD: 20%; Ramus 20%; LCx normal   RCA 90% prox; 80% mid; 95% mid  10% distal (in previously placed DES)  He underwent PTCA/DES (RESOLUTE) x 2 to mid RCA    Echo  45 to 50%  Severe inferior/inferoseptal hypokinesis   AS could not be evaluated, not severe   Pt discharged to rehab [facility]) on 04/02/19  Plan for 6 days   Pt did not like and signed out  Had virtual visit with Rod Can on 04/10/19   At that visit had problems with communication.   Confused about meds.   ? Taking brilinta.   He says he was out of some meds  The pt says he was at home yesterday   Started getting SOB  Mild chest pressure    Went to bed  Today  More SOB   Mild chest pressure   Nothing like pain prior to stent   Came to ED Denies cough  No F/C   No n/v    Sl edema   Appetite OK   In ED patient tachypneic   CXR done   Bronchitic changes   COVID test negative   Patient given lasix x 1   Pt says breathing is some better He denis eating extra salt  Past Medical History:  Diagnosis Date  . Alcoholism ([place])   . Asthma   . Bipolar affective disorder ([place])   . Chronic combined systolic and diastolic CHF (congestive heart failure) ([place])    a. 08/2016 Echo: EF 40-45%, mild AS, mild  to mod MR, mildly dil LA/RA, mild-mod TR; b. 07/2017 Echo: EF 40-45%, mod LVH, Gr1 DD, mild to mod AS, mildly dil LA, nl RV fxn; c. 03/2019 Echo: EF 45-50%, AS (not severe). Mod dil PA.  . CKD (chronic kidney disease), stage III ([place])   . COPD (chronic obstructive pulmonary disease) ([place])   . Coronary artery disease    a. 01/2004 s/p PCI and Taxus DES to dRCA (3.5 x 12 mm); b. 07/2017 Lexiscan MV: no ischemia. Sm area of apicl thinning, likely attenuation. EF 33% (GI uptake noted)-->Low risk; c. 03/2019 Inf STEMI/PCI: LM nl, LAD min irregs, D1 20ost, RI 20ost, LCX nl, RCA 90p/54m (4.0x26 Resolute Onyx DES), 81m (4.0x15 Resolute Onyx DES), 10d ISR.  Marland Kitchen Degenerative joint disease   . Essential hypertension   . Hyperlipidemia   . Hypertension   . Ischemic cardiomyopathy    a. 08/2016 Echo: EF 40-45%; b. 07/2017 Echo: EF 40-45%; c. 03/2019 Echo: EF 45-50%.  . Prostate CA ([place])    prostate ca dx 20 yrs ago  . PVD (peripheral vascular disease) (  [place])   . Tobacco abuse     Past Surgical History:  Procedure Laterality Date  . CARDIAC CATHETERIZATION    . CORONARY ANGIOGRAPHY N/A 04/01/2019   Procedure: CORONARY ANGIOGRAPHY;  Surgeon: Nelva Bush, MD;  Location: [place] [facility];  Service: Cardiovascular;  Laterality: N/A;  . CORONARY ANGIOPLASTY WITH STENT PLACEMENT    . CORONARY/GRAFT ACUTE MI REVASCULARIZATION N/A 04/01/2019   Procedure: Coronary/Graft Acute MI Revascularization;  Surgeon: Nelva Bush, MD;  Location: [place] [facility];  Service: Cardiovascular;  Laterality: N/A;  . TOTAL HIP ARTHROPLASTY     right       Inpatient Medications: Scheduled Meds:  Continuous Infusions:  PRN Meds:   Allergies:    Allergies  Allergen Reactions  . Amlodipine Other (See Comments)    Constipation  Constipation Constipation  . Benazepril-Hydrochlorothiazide Other (See Comments)    Constipation  Constipation Constipation  . Chlorthalidone Other (See Comments)    Hyponatremia  Hyponatremia  . Lisinopril Other (See Comments)  . Other Other (See Comments)    "ANY BLOOD PRESSURE MEDICATIONS THAT I'VE TRIED" - PT. DOES NOT REMEMBER WHICH ONES "ANY BLOOD PRESSURE MEDICATIONS THAT I'VE TRIED"- PT. DOES NOT REMEMBER WHICH ONES- CONSTIPATION    Social History:   Social History   Socioeconomic History  . Marital status: Legally Separated    Spouse name: Not on file  . Number of children: Not on file  . Years of education: Not on file  . Highest education level: Not on file  Occupational History  . Occupation: retired  Scientific laboratory technician  . Financial resource strain: Not on file  . Food insecurity:    Worry: Not on file    Inability: Not on file  . Transportation needs:    Medical: Not on file    Non-medical: Not on file  Tobacco Use  . Smoking status: Former Smoker    Packs/day: 1.00    Years: 50.00    Pack years: 50.00    Types: Cigarettes  . Smokeless tobacco: Never Used  Substance and Sexual Activity  . Alcohol use: Yes    Comment: social   . Drug use: No  . Sexual activity: Never  Lifestyle  . Physical activity:    Days per week: Not on file    Minutes per session: Not on file  . Stress: Not on file  Relationships  . Social connections:    Talks on phone: Not on file    Gets together: Not on file    Attends religious service: Not on file    Active member of club or organization: Not on file    Attends meetings of clubs or organizations: Not on file    Relationship status: Not on file  . Intimate partner violence:    Fear of current or ex partner: Not on file    Emotionally abused: Not on file    Physically abused: Not on file    Forced sexual activity: Not on file  Other Topics Concern  . Not on file  Social History Narrative  . Not on file    Family History:    Family History  Problem Relation Age of Onset  . Hypertension Mother   . Hyperlipidemia Mother   . Heart attack Mother   . Hypertension Father   . Hyperlipidemia Father    . Heart attack Father   . Prostate cancer Neg Hx   . Bladder Cancer Neg Hx   . Kidney cancer Neg Hx  ROS:  Please see the history of present illness.   All other ROS reviewed and negative.     Physical Exam/Data:   Vitals:   04/12/19 1042 04/12/19 1043 04/12/19 1212  BP: 135/60  135/60  Pulse: 64  (!) 59  Resp: 18  (!) 22  Temp: 98.2 F (36.8 C)    SpO2: 97%  100%  Weight:  (!) 136.5 kg   Height:  5\' 7"  (1.702 m)    No intake or output data in the 24 hours ending 04/12/19 1246 Last 3 Weights 04/12/2019 04/10/2019 04/02/2019  Weight (lbs) 301 lb 304 lb 309 lb 8 oz  Weight (kg) 136.533 kg 137.893 kg 140.388 kg     Body mass index is 47.14 kg/m.  General:  Morbidly obese 84 yo  in no acute distress HEENT: normal Neck: no JVD  Neck is full though   Vascular: No carotid bruits; FA pulses 2+ bilaterally without bruits  Cardiac:  normal S1, S2; RRR; no murmur  Lungs:  clear to auscultation bilaterally, no wheezing, rhonchi or rales  Abd: Obese   RUQ wiwth mild tenderness Ext: No signif edema Musculoskeletal:  No deformities, BUE and BLE strength normal and equal Skin: warm and dry  Neuro:  CNs 2-12 intact, no focal abnormalities noted Psych:  Normal affect   EKG:  The EKG was personally reviewed and demonstrates: SR 65  Occasional PVC, PAC  Nonspecific ST changes   Telemetry:  Telemetry was personally reviewed and demonstrates:  SR   Relevant CV Studies: Noted above Cath and echo  Laboratory Data:  Chemistry Recent Labs  Lab 04/12/19 1047  NA 133*  K 4.3  CL 104  CO2 20*  GLUCOSE 107*  BUN 29*  CREATININE 2.03*  CALCIUM 8.0*  GFRNONAA 30*  GFRAA 34*  ANIONGAP 9    No results for input(s): PROT, ALBUMIN, AST, ALT, ALKPHOS, BILITOT in the last 168 hours. Hematology Recent Labs  Lab 04/12/19 1047  WBC 6.5  RBC 3.50*  HGB 9.5*  HCT 29.0*  MCV 82.9  MCH 27.1  MCHC 32.8  RDW 14.6  PLT 246   Cardiac Enzymes Recent Labs  Lab 04/12/19 1047   TROPONINI 0.03*   No results for input(s): TROPIPOC in the last 168 hours.  BNP Recent Labs  Lab 04/12/19 1048  BNP 182.0*    DDimer No results for input(s): DDIMER in the last 168 hours.  Radiology/Studies:  Dg Chest Portable 1 View  Result Date: 04/12/2019 CLINICAL DATA:  Shortness of breath. EXAM: PORTABLE CHEST 1 VIEW COMPARISON:  April 01, 2019 FINDINGS: Stable cardiomegaly. No overt edema. Mild atelectasis in the bases. No suspicious infiltrates. Mildly coarsened lung markings. IMPRESSION: 1. Mildly coarsened lung markings suggest the possibility of bronchitic change. 2. Mild opacity in the bases consistent with atelectasis. Electronically Signed   By: Dorise Bullion III M.D   On: 04/12/2019 11:23    Assessment and Plan:   Pt is an 84 yo who with hx of CAD, CHF   Just discharged from the hospital about 1 wk ago     Now presents with 1 day hx SOB   Has been out of some meds he admits       1  Dypsnea   I am not  Sure what triggered current episode  He has some RUQ tenderness   Given lasix with some improviement    May have been some volume overload  Exam is difficult given size Follow renal function  Dose additional lasix x 1  Check LFTs  I am not convinced related to brilinta    2  Chronic systolic CHF  As noted above     3   CAD EKG without acute changes to sugg stent closure  COntiue meds   Need social work/ pharmacy to work with pt so that he does not miss meds, esp platelet agents  4  CKD  Follow renal funtion with lasix   4  HL   Continue statin      For questions or updates, please contact [place] Please consult [email or web address] for contact info under     Signed, Dorris Carnes, MD  04/12/2019 12:46 PM

## 2019-04-12 NOTE — ED Notes (Signed)
ED TO INPATIENT HANDOFF REPORT  ED Nurse Name and Phone #: Anderson Malta [phone]  S Name/Age/Gender [name] 84 y.o. male Room/Bed: ED32A/ED32A  Code Status   Code Status: Prior  Home/SNF/Other Home Patient oriented to: self, place, time and situation Is this baseline? Yes   Triage Complete: Triage complete  Chief Complaint sob ems  Triage Note Pt to ER via EMS from home with c/o [facility] starting last night.  Pt seen for the first time by home health nurse and noted wheezes.  [facility] nurse suggested pt come for evaluation.  PT denies fever, cough, sore throat or other c/o.  Pt with hx of recent MI, denies CP at this time.   Allergies Allergies  Allergen Reactions  . Amlodipine Other (See Comments)    Constipation  Constipation Constipation  . Benazepril-Hydrochlorothiazide Other (See Comments)    Constipation  Constipation Constipation  . Chlorthalidone Other (See Comments)    Hyponatremia Hyponatremia  . Lisinopril Other (See Comments)  . Other Other (See Comments)    "ANY BLOOD PRESSURE MEDICATIONS THAT I'VE TRIED" - PT. DOES NOT REMEMBER WHICH ONES "ANY BLOOD PRESSURE MEDICATIONS THAT I'VE TRIED"- PT. DOES NOT REMEMBER WHICH ONES- CONSTIPATION    Level of Care/Admitting Diagnosis ED Disposition    ED Disposition Condition [facility] Area: [place] [[id number]]  Level of Care: Telemetry [5]  Covid Evaluation: N/A  Diagnosis: Acute respiratory failure with hypoxia [facility]) [[id number]]  Admitting Physician: Hyman Bible DODD [[id number]]  Attending Physician: Hyman Bible DODD [[id number]]  Estimated length of stay: past midnight tomorrow  Certification:: I certify this patient will need inpatient services for at least 2 midnights  PT Class (Do Not Modify): Inpatient [101]  PT Acc Code (Do Not Modify): Private [1]       B Medical/Surgery History Past Medical History:  Diagnosis Date  . Alcoholism ([place])   . Asthma   . Bipolar affective  disorder ([place])   . Chronic combined systolic and diastolic CHF (congestive heart failure) ([place])    a. 08/2016 Echo: EF 40-45%, mild AS, mild to mod MR, mildly dil LA/RA, mild-mod TR; b. 07/2017 Echo: EF 40-45%, mod LVH, Gr1 DD, mild to mod AS, mildly dil LA, nl RV fxn; c. 03/2019 Echo: EF 45-50%, AS (not severe). Mod dil PA.  . CKD (chronic kidney disease), stage III ([place])   . COPD (chronic obstructive pulmonary disease) ([place])   . Coronary artery disease    a. 01/2004 s/p PCI and Taxus DES to dRCA (3.5 x 12 mm); b. 07/2017 Lexiscan MV: no ischemia. Sm area of apicl thinning, likely attenuation. EF 33% (GI uptake noted)-->Low risk; c. 03/2019 Inf STEMI/PCI: LM nl, LAD min irregs, D1 20ost, RI 20ost, LCX nl, RCA 90p/24m (4.0x26 Resolute Onyx DES), 34m (4.0x15 Resolute Onyx DES), 10d ISR.  Marland Kitchen Degenerative joint disease   . Essential hypertension   . Hyperlipidemia   . Hypertension   . Ischemic cardiomyopathy    a. 08/2016 Echo: EF 40-45%; b. 07/2017 Echo: EF 40-45%; c. 03/2019 Echo: EF 45-50%.  . Prostate CA ([place])    prostate ca dx 20 yrs ago  . PVD (peripheral vascular disease) ([place])   . Tobacco abuse    Past Surgical History:  Procedure Laterality Date  . CARDIAC CATHETERIZATION    . CORONARY ANGIOGRAPHY N/A 04/01/2019   Procedure: CORONARY ANGIOGRAPHY;  Surgeon: Nelva Bush, MD;  Location: [place] [facility];  Service: Cardiovascular;  Laterality: N/A;  . CORONARY  ANGIOPLASTY WITH STENT PLACEMENT    . CORONARY/GRAFT ACUTE MI REVASCULARIZATION N/A 04/01/2019   Procedure: Coronary/Graft Acute MI Revascularization;  Surgeon: Nelva Bush, MD;  Location: [place] [facility];  Service: Cardiovascular;  Laterality: N/A;  . TOTAL HIP ARTHROPLASTY     right     A IV Location/Drains/Wounds Patient Lines/Drains/Airways Status   Active Line/Drains/Airways    Name:   Placement date:   Placement time:   Site:   Days:   Peripheral IV 04/12/19 Left Hand   04/12/19    1051    Hand   less than 1    External Urinary Catheter   04/01/19    0256    -   11          Intake/Output Last 24 hours No intake or output data in the 24 hours ending 04/12/19 1353  Labs/Imaging Results for orders placed or performed during the hospital encounter of 04/12/19 (from the past 48 hour(s))  Troponin I - ONCE - STAT     Status: Abnormal   Collection Time: 04/12/19 10:47 AM  Result Value Ref Range   Troponin I 0.03 (HH) <0.03 ng/mL    Comment: CRITICAL RESULT CALLED TO, READ BACK BY AND VERIFIED WITH Kathee Tumlin AT 1120 04/12/2019.PMF Performed at [facility], [address] [place], [place] [address]   CBC     Status: Abnormal   Collection Time: 04/12/19 10:47 AM  Result Value Ref Range   WBC 6.5 4.0 - 10.5 K/uL   RBC 3.50 (L) 4.22 - 5.81 MIL/uL   Hemoglobin 9.5 (L) 13.0 - 17.0 g/dL   HCT 29.0 (L) 39.0 - 52.0 %   MCV 82.9 80.0 - 100.0 fL   MCH 27.1 26.0 - 34.0 pg   MCHC 32.8 30.0 - 36.0 g/dL   RDW 14.6 11.5 - 15.5 %   Platelets 246 150 - 400 K/uL   nRBC 0.0 0.0 - 0.2 %    Comment: Performed at [facility], [address] [place], [place] [address]  Basic metabolic panel     Status: Abnormal   Collection Time: 04/12/19 10:47 AM  Result Value Ref Range   Sodium 133 (L) 135 - 145 mmol/L   Potassium 4.3 3.5 - 5.1 mmol/L   Chloride 104 98 - 111 mmol/L   CO2 20 (L) 22 - 32 mmol/L   Glucose, Bld 107 (H) 70 - 99 mg/dL   BUN 29 (H) 8 - 23 mg/dL   Creatinine, Ser 2.03 (H) 0.61 - 1.24 mg/dL   Calcium 8.0 (L) 8.9 - 10.3 mg/dL   GFR calc non Af Amer 30 (L) >60 mL/min   GFR calc Af Amer 34 (L) >60 mL/min   Anion gap 9 5 - 15    Comment: Performed at [facility], [address] [place], [place] [address]  Brain natriuretic peptide     Status: Abnormal   Collection Time: 04/12/19 10:48 AM  Result Value Ref Range   B Natriuretic Peptide 182.0 (H) 0.0 - 100.0 pg/mL    Comment: Performed at [facility], [address] [place], [place] [address]   SARS Coronavirus 2 [facility] order, Performed in [place] hospital lab)     Status: None   Collection Time: 04/12/19 11:14 AM  Result Value Ref Range   SARS Coronavirus 2 NEGATIVE NEGATIVE    Comment: (NOTE) If result is NEGATIVE SARS-CoV-2 target nucleic acids are NOT DETECTED. The SARS-CoV-2 RNA is generally detectable in  upper and lower  respiratory specimens during the acute phase of infection. The lowest  concentration of SARS-CoV-2 viral copies this assay can detect is 250  copies / mL. A negative result does not preclude SARS-CoV-2 infection  and should not be used as the sole basis for treatment or other  patient management decisions.  A negative result may occur with  improper specimen collection / handling, submission of specimen other  than nasopharyngeal swab, presence of viral mutation(s) within the  areas targeted by this assay, and inadequate number of viral copies  (<250 copies / mL). A negative result must be combined with clinical  observations, patient history, and epidemiological information. If result is POSITIVE SARS-CoV-2 target nucleic acids are DETECTED. The SARS-CoV-2 RNA is generally detectable in upper and lower  respiratory specimens dur ing the acute phase of infection.  Positive  results are indicative of active infection with SARS-CoV-2.  Clinical  correlation with patient history and other diagnostic information is  necessary to determine patient infection status.  Positive results do  not rule out bacterial infection or co-infection with other viruses. If result is PRESUMPTIVE POSTIVE SARS-CoV-2 nucleic acids MAY BE PRESENT.   A presumptive positive result was obtained on the submitted specimen  and confirmed on repeat testing.  While 2019 novel coronavirus  (SARS-CoV-2) nucleic acids may be present in the submitted sample  additional confirmatory testing may be necessary for epidemiological  and / or clinical management purposes  to  differentiate between  SARS-CoV-2 and other Sarbecovirus currently known to infect humans.  If clinically indicated additional testing with an alternate test  methodology [phone]) is advised. The SARS-CoV-2 RNA is generally  detectable in upper and lower respiratory sp ecimens during the acute  phase of infection. The expected result is Negative. Fact Sheet for Patients:  StrictlyIdeas.no Fact Sheet for Healthcare Providers: BankingDealers.co.za This test is not yet approved or cleared by the [place] FDA and has been authorized for detection and/or diagnosis of SARS-CoV-2 by FDA under an Emergency Use Authorization (EUA).  This EUA will remain in effect (meaning this test can be used) for the duration of the COVID-19 declaration under Section 564(b)(1) of the Act, 21 U.S.C. section 360bbb-3(b)(1), unless the authorization is terminated or revoked sooner. Performed at [facility], [address], [place], [place] [address]    Dg Chest Portable 1 View  Result Date: 04/12/2019 CLINICAL DATA:  Shortness of breath. EXAM: PORTABLE CHEST 1 VIEW COMPARISON:  April 01, 2019 FINDINGS: Stable cardiomegaly. No overt edema. Mild atelectasis in the bases. No suspicious infiltrates. Mildly coarsened lung markings. IMPRESSION: 1. Mildly coarsened lung markings suggest the possibility of bronchitic change. 2. Mild opacity in the bases consistent with atelectasis. Electronically Signed   By: Dorise Bullion III M.D   On: 04/12/2019 11:23    Pending Labs Unresulted Labs (From admission, onward)    Start     Ordered   Signed and Occupational hygienist morning,   R     Signed and Held   Signed and Held  CBC  Tomorrow morning,   R     Signed and Held   Signed and Held  Procalcitonin - Baseline  ONCE - STAT,   STAT     Signed and Held          Vitals/Pain Today's Vitals   04/12/19 1042 04/12/19 1043 04/12/19 1212  BP:  135/60  135/60  Pulse: 64  (!) 59  Resp:  18  (!) 22  Temp: 98.2 F (36.8 C)    SpO2: 97%  100%  Weight:  (!) 136.5 kg   Height:  5\' 7"  (1.702 m)   PainSc:  10-Worst pain ever     Isolation Precautions No active isolations  Medications Medications  furosemide (LASIX) injection 80 mg (80 mg Intravenous Given 04/12/19 1206)    Mobility walks Low fall risk   Focused Assessments Pulmonary Assessment Handoff:  Lung sounds: Bilateral Breath Sounds: Coarse crackles O2 Device: Room Air O2 Flow Rate (L/min): 2 L/min      R Recommendations: See Admitting Provider Note  Report given to:   Additional Notes:

## 2019-04-12 NOTE — H&P (Addendum)
[facility] [place] NAME: [name]    MR#:  [id number]  DATE OF BIRTH:  [birth year]  DATE OF ADMISSION:  04/12/2019  PRIMARY CARE PHYSICIAN: Marguerita Merles, MD   REQUESTING/REFERRING PHYSICIAN: Delman Kitten, MD  CHIEF COMPLAINT:   Chief Complaint  Patient presents with  . Shortness of Breath    HISTORY OF PRESENT ILLNESS:  [name]  is a 84 y.o. male with a known history of recent STEMI 4/14, chronic combined systolic and diastolic CHF, hypertension, hyperlipidemia, CKD 3, COPD, bipolar disorder who presented to the ED with shortness of breath for the last couple of days.  Of note, he was recently hospitalized from 4/14-4/15 with an inferior STEMI.  He underwent cardiac cath with successful primary PCI.  He was discharged to [place] on aspirin, statin, Brilinta, and Coreg.  Patient states he did not receive any of his new medications while at [facility], and just began taking his new medicines last night.  He endorses worsening lower extremity edema and orthopnea.  He denies any cough, fevers, chills.  No chest pain.  In the ED, he was tachypneic with respiratory rates in the low 20s.  Labs were significant for creatinine 2.03, BNP 182, hemoglobin 9.5.  COVID testing was negative.  Chest x-ray was unremarkable.  Cardiology was consulted and recommended IV Lasix 80 mg x 1.  Hospitalists were called for admission.  PAST MEDICAL HISTORY:   Past Medical History:  Diagnosis Date  . Alcoholism ([place])   . Asthma   . Bipolar affective disorder ([place])   . Chronic combined systolic and diastolic CHF (congestive heart failure) ([place])    a. 08/2016 Echo: EF 40-45%, mild AS, mild to mod MR, mildly dil LA/RA, mild-mod TR; b. 07/2017 Echo: EF 40-45%, mod LVH, Gr1 DD, mild to mod AS, mildly dil LA, nl RV fxn; c. 03/2019 Echo: EF 45-50%, AS (not severe). Mod dil PA.  . CKD (chronic kidney disease), stage III ([place])   . COPD (chronic obstructive  pulmonary disease) ([place])   . Coronary artery disease    a. 01/2004 s/p PCI and Taxus DES to dRCA (3.5 x 12 mm); b. 07/2017 Lexiscan MV: no ischemia. Sm area of apicl thinning, likely attenuation. EF 33% (GI uptake noted)-->Low risk; c. 03/2019 Inf STEMI/PCI: LM nl, LAD min irregs, D1 20ost, RI 20ost, LCX nl, RCA 90p/43m (4.0x26 Resolute Onyx DES), 42m (4.0x15 Resolute Onyx DES), 10d ISR.  Marland Kitchen Degenerative joint disease   . Essential hypertension   . Hyperlipidemia   . Hypertension   . Ischemic cardiomyopathy    a. 08/2016 Echo: EF 40-45%; b. 07/2017 Echo: EF 40-45%; c. 03/2019 Echo: EF 45-50%.  . Prostate CA ([place])    prostate ca dx 20 yrs ago  . PVD (peripheral vascular disease) ([place])   . Tobacco abuse     PAST SURGICAL HISTORY:   Past Surgical History:  Procedure Laterality Date  . CARDIAC CATHETERIZATION    . CORONARY ANGIOGRAPHY N/A 04/01/2019   Procedure: CORONARY ANGIOGRAPHY;  Surgeon: Nelva Bush, MD;  Location: [place] [facility];  Service: Cardiovascular;  Laterality: N/A;  . CORONARY ANGIOPLASTY WITH STENT PLACEMENT    . CORONARY/GRAFT ACUTE MI REVASCULARIZATION N/A 04/01/2019   Procedure: Coronary/Graft Acute MI Revascularization;  Surgeon: Nelva Bush, MD;  Location: [place] [facility];  Service: Cardiovascular;  Laterality: N/A;  . TOTAL HIP ARTHROPLASTY     right    SOCIAL HISTORY:  Social History   Tobacco Use  . Smoking status: Former Smoker    Packs/day: 1.00    Years: 50.00    Pack years: 50.00    Types: Cigarettes  . Smokeless tobacco: Never Used  Substance Use Topics  . Alcohol use: Yes    Comment: social     FAMILY HISTORY:   Family History  Problem Relation Age of Onset  . Hypertension Mother   . Hyperlipidemia Mother   . Heart attack Mother   . Hypertension Father   . Hyperlipidemia Father   . Heart attack Father   . Prostate cancer Neg Hx   . Bladder Cancer Neg Hx   . Kidney cancer Neg Hx     DRUG ALLERGIES:   Allergies   Allergen Reactions  . Amlodipine Other (See Comments)    Constipation  Constipation Constipation  . Benazepril-Hydrochlorothiazide Other (See Comments)    Constipation  Constipation Constipation  . Chlorthalidone Other (See Comments)    Hyponatremia Hyponatremia  . Lisinopril Other (See Comments)  . Other Other (See Comments)    "ANY BLOOD PRESSURE MEDICATIONS THAT I'VE TRIED" - PT. DOES NOT REMEMBER WHICH ONES "ANY BLOOD PRESSURE MEDICATIONS THAT I'VE TRIED"- PT. DOES NOT REMEMBER WHICH ONES- CONSTIPATION    REVIEW OF SYSTEMS:   Review of Systems  Constitutional: Negative for chills and fever.  HENT: Negative for congestion and sore throat.   Eyes: Negative for blurred vision and double vision.  Respiratory: Positive for shortness of breath. Negative for cough.   Cardiovascular: Positive for orthopnea and leg swelling. Negative for chest pain and palpitations.  Gastrointestinal: Negative for nausea and vomiting.  Genitourinary: Negative for dysuria and urgency.  Musculoskeletal: Negative for back pain and neck pain.  Neurological: Negative for dizziness and headaches.  Psychiatric/Behavioral: Negative for depression. The patient is not nervous/anxious.     MEDICATIONS AT HOME:   Prior to Admission medications   Medication Sig Start Date End Date Taking? Authorizing Provider  albuterol (PROAIR HFA) 108 (90 Base) MCG/ACT inhaler Inhale 2 puffs into the lungs every 6 (six) hours as needed for wheezing or shortness of breath. Reported on 04/21/2016 05/31/17 04/10/19  Loletha Grayer, MD  amLODipine (NORVASC) 5 MG tablet Take 1 tablet (5 mg total) by mouth daily. 04/10/19   [name], MD  aspirin EC 81 MG tablet Take 1 tablet (81 mg total) by mouth daily. 04/10/19   [name], MD  atorvastatin (LIPITOR) 40 MG tablet Take 1 tablet (40 mg total) by mouth daily. 04/10/19   [name], MD  carvedilol (COREG) 12.5 MG tablet Take 1 tablet (12.5 mg total) by mouth 2  (two) times daily with a meal. 04/02/19   Bettey Costa, MD  fluticasone-salmeterol (ADVAIR HFA) 115-21 MCG/ACT inhaler Inhale 2 puffs into the lungs 2 (two) times daily.    [provider]  isosorbide mononitrate (IMDUR) 30 MG 24 hr tablet Take 1 tablet (30 mg total) by mouth daily. 04/10/19   [name], MD  losartan (COZAAR) 25 MG tablet Take 0.5 tablets (12.5 mg total) by mouth daily. 04/10/19   [name], MD  nitroGLYCERIN (NITROSTAT) 0.4 MG SL tablet Take 0.4 mg by mouth every 5 (five) minutes x 3 doses as needed for chest pain. As needed for chest pain 12/06/15   [provider]  ticagrelor (BRILINTA) 90 MG TABS tablet Take 1 tablet (90 mg total) by mouth 2 (two) times daily. 04/10/19   [name], MD  VITAL SIGNS:  Blood pressure 135/60, pulse (!) 59, temperature 98.2 F (36.8 C), resp. rate (!) 22, height 5\' 7"  (1.702 m), weight (!) 136.5 kg, SpO2 100 %.  PHYSICAL EXAMINATION:  Physical Exam  GENERAL:  84 y.o.-year-old patient lying in the bed with no acute distress.  EYES: Pupils equal, round, reactive to light and accommodation. No scleral icterus. Extraocular muscles intact.  HEENT: Head atraumatic, normocephalic. Oropharynx and nasopharynx clear.  NECK:  Supple, no jugular venous distention. No thyroid enlargement, no tenderness.  LUNGS: + Diminished breath sounds in the lung bases bilaterally.  No wheezing, rales,rhonchi or crepitation. + Unable to speak in full sentences due to shortness of breath. CARDIOVASCULAR: RRR, S1, S2 normal. No murmurs, rubs, or gallops.  ABDOMEN: Soft, nontender, nondistended. Bowel sounds present. No organomegaly or mass.  EXTREMITIES: No cyanosis, or clubbing. + Trace bilateral pitting edema. NEUROLOGIC: Cranial nerves II through XII are intact. Muscle strength 5/5 in all extremities. Sensation intact. Gait not checked.  PSYCHIATRIC: The patient is alert and oriented x 3.  SKIN: No obvious rash, lesion, or  ulcer.   LABORATORY PANEL:   CBC Recent Labs  Lab 04/12/19 1047  WBC 6.5  HGB 9.5*  HCT 29.0*  PLT 246   ------------------------------------------------------------------------------------------------------------------  Chemistries  Recent Labs  Lab 04/12/19 1047  NA 133*  K 4.3  CL 104  CO2 20*  GLUCOSE 107*  BUN 29*  CREATININE 2.03*  CALCIUM 8.0*   ------------------------------------------------------------------------------------------------------------------  Cardiac Enzymes Recent Labs  Lab 04/12/19 1047  TROPONINI 0.03*   ------------------------------------------------------------------------------------------------------------------  RADIOLOGY:  Dg Chest Portable 1 View  Result Date: 04/12/2019 CLINICAL DATA:  Shortness of breath. EXAM: PORTABLE CHEST 1 VIEW COMPARISON:  April 01, 2019 FINDINGS: Stable cardiomegaly. No overt edema. Mild atelectasis in the bases. No suspicious infiltrates. Mildly coarsened lung markings. IMPRESSION: 1. Mildly coarsened lung markings suggest the possibility of bronchitic change. 2. Mild opacity in the bases consistent with atelectasis. Electronically Signed   By: Dorise Bullion III M.D   On: 04/12/2019 11:23      IMPRESSION AND PLAN:   Acute hypoxic respiratory failure-likely secondary to acute on chronic combined systolic and diastolic heart failure.  Patient with recent STEMI 10 days ago. No wheezing to suggest COPD exacerbation. Requiring 2 L O2 in the ED. -COVID testing negative -Received Lasix 80 mg IV x1 in the ED, will continue Lasix 40 mg IV twice daily -Cardiology consult -Repeat chest x-ray in the morning -Check procalcitonin -Cardiac monitoring -Wean O2 as able  CAD with recent inferior STEMI 4/14 s/p PCI- no active chest pain. -Continue home aspirin, Brilinta, Coreg, Imdur, losartan  Hypertension- normotensive in the ED -Continue home norvasc, losartan, coreg  CKD stage III-stable.  Creatinine at  baseline. -Avoid nephrotoxic agents -Monitor creatinine closely in the setting of IV diuresis  COPD-stable, no signs of acute exacerbation. -Continue home inhalers -DuoNebs PRN  Hyperlipidemia-stable -Continue home Lipitor  All the records are reviewed and case discussed with ED provider. Management plans discussed with the patient, family and they are in agreement.  CODE STATUS: Full  TOTAL TIME TAKING CARE OF THIS PATIENT: 45 minutes.    [name] M.D on 04/12/2019 at 1:07 PM  Between 7am to 6pm - Pager [phone]  After 6pm go to [email or web address] - password [email or web address] [facility]  [facility]  Office  [phone]  CC: Primary care physician; Marguerita Merles, MD   Note: This dictation was prepared with Dragon dictation along with smaller  Company secretary. Any transcriptional errors that result from this process are unintentional.

## 2019-04-12 NOTE — ED Triage Notes (Signed)
Pt to ER via EMS from home with c/o [facility] starting last night.  Pt seen for the first time by home health nurse and noted wheezes.  [facility] nurse suggested pt come for evaluation.  PT denies fever, cough, sore throat or other c/o.  Pt with hx of recent MI, denies CP at this time.

## 2019-04-12 NOTE — ED Provider Notes (Signed)
[facility] Emergency Department Provider Note ____________________________________________   First MD Initiated Contact with Patient 04/12/19 1034     (approximate)  I have reviewed the triage vital signs and the nursing notes.  HISTORY  Chief Complaint Shortness of Breath  HPI [name] is a 84 y.o. male recently hospitalized for heart attack  Patient reports that he started developing shortness of breath yesterday.  Worse when he tries to sleep at night.  Is getting short of breath having to catch his breath frequently.  Reports he had some similar in the past when he had fluid on his lungs, it seems to be worse though.  He has been compliant with his medication, but had a short stay at rehab and left 2 days ago went 1 day without his medications which he now has again  No fevers or chills.  No cough.  No nausea vomiting or abdominal pain.  Has had some intermittent slight chest pain but he cannot really describe it too well, currently not having any chest pain  Legs are slightly swollen as well.  Feels very short of breath specially when he tries to get up and do anything, cannot seem to catch his breath due to shortness of breath.   Past Medical History:  Diagnosis Date  . Alcoholism ([place])   . Asthma   . Bipolar affective disorder ([place])   . Chronic combined systolic and diastolic CHF (congestive heart failure) ([place])    a. 08/2016 Echo: EF 40-45%, mild AS, mild to mod MR, mildly dil LA/RA, mild-mod TR; b. 07/2017 Echo: EF 40-45%, mod LVH, Gr1 DD, mild to mod AS, mildly dil LA, nl RV fxn; c. 03/2019 Echo: EF 45-50%, AS (not severe). Mod dil PA.  . CKD (chronic kidney disease), stage III ([place])   . COPD (chronic obstructive pulmonary disease) ([place])   . Coronary artery disease    a. 01/2004 s/p PCI and Taxus DES to dRCA (3.5 x 12 mm); b. 07/2017 Lexiscan MV: no ischemia. Sm area of apicl thinning, likely attenuation. EF 33% (GI uptake noted)-->Low risk; c.  03/2019 Inf STEMI/PCI: LM nl, LAD min irregs, D1 20ost, RI 20ost, LCX nl, RCA 90p/76m (4.0x26 Resolute Onyx DES), 60m (4.0x15 Resolute Onyx DES), 10d ISR.  Marland Kitchen Degenerative joint disease   . Essential hypertension   . Hyperlipidemia   . Hypertension   . Ischemic cardiomyopathy    a. 08/2016 Echo: EF 40-45%; b. 07/2017 Echo: EF 40-45%; c. 03/2019 Echo: EF 45-50%.  . Prostate CA ([place])    prostate ca dx 20 yrs ago  . PVD (peripheral vascular disease) ([place])   . Tobacco abuse     Patient Active Problem List   Diagnosis Date Noted  . STEMI (ST elevation myocardial infarction) ([place]) 04/01/2019  . STEMI involving right coronary artery ([place]) 04/01/2019  . Acute ST elevation myocardial infarction (STEMI) ([employer]) 04/01/2019  . Lower extremity pain, bilateral 05/15/2018  . Elevated troponin I level 07/31/2017  . Hyperkalemia 05/28/2017  . Syncope 05/27/2017  . Meningioma ([place]) 01/05/2017  . GI bleed 12/01/2016  . Alcohol abuse 10/30/2016  . Chronic systolic heart failure ([place]) 10/04/2016  . COPD (chronic obstructive pulmonary disease) with chronic bronchitis ([place]) 10/04/2016  . Obstructive sleep apnea 10/04/2016  . Hyponatremia 08/31/2016  . UTI (lower urinary tract infection) 08/31/2016  . Exertional dyspnea 02/11/2016  . Hyperlipidemia 12/03/2015  . Essential hypertension   . Coronary artery disease 01/19/2004    Past Surgical History:  Procedure  Laterality Date  . CARDIAC CATHETERIZATION    . CORONARY ANGIOGRAPHY N/A 04/01/2019   Procedure: CORONARY ANGIOGRAPHY;  Surgeon: Nelva Bush, MD;  Location: [place] [facility];  Service: Cardiovascular;  Laterality: N/A;  . CORONARY ANGIOPLASTY WITH STENT PLACEMENT    . CORONARY/GRAFT ACUTE MI REVASCULARIZATION N/A 04/01/2019   Procedure: Coronary/Graft Acute MI Revascularization;  Surgeon: Nelva Bush, MD;  Location: [place] [facility];  Service: Cardiovascular;  Laterality: N/A;  . TOTAL HIP ARTHROPLASTY     right    Prior to  Admission medications   Medication Sig Start Date End Date Taking? Authorizing Provider  albuterol (PROAIR HFA) 108 (90 Base) MCG/ACT inhaler Inhale 2 puffs into the lungs every 6 (six) hours as needed for wheezing or shortness of breath. Reported on 04/21/2016 05/31/17 04/10/19  Loletha Grayer, MD  amLODipine (NORVASC) 5 MG tablet Take 1 tablet (5 mg total) by mouth daily. 04/10/19   [name], MD  aspirin EC 81 MG tablet Take 1 tablet (81 mg total) by mouth daily. 04/10/19   [name], MD  atorvastatin (LIPITOR) 40 MG tablet Take 1 tablet (40 mg total) by mouth daily. 04/10/19   [name], MD  carvedilol (COREG) 12.5 MG tablet Take 1 tablet (12.5 mg total) by mouth 2 (two) times daily with a meal. 04/02/19   Bettey Costa, MD  fluticasone-salmeterol (ADVAIR HFA) 115-21 MCG/ACT inhaler Inhale 2 puffs into the lungs 2 (two) times daily.    [provider]  isosorbide mononitrate (IMDUR) 30 MG 24 hr tablet Take 1 tablet (30 mg total) by mouth daily. 04/10/19   [name], MD  losartan (COZAAR) 25 MG tablet Take 0.5 tablets (12.5 mg total) by mouth daily. 04/10/19   [name], MD  nitroGLYCERIN (NITROSTAT) 0.4 MG SL tablet Take 0.4 mg by mouth every 5 (five) minutes x 3 doses as needed for chest pain. As needed for chest pain 12/06/15   [provider]  ticagrelor (BRILINTA) 90 MG TABS tablet Take 1 tablet (90 mg total) by mouth 2 (two) times daily. 04/10/19   [name], MD    Allergies Amlodipine; Benazepril-hydrochlorothiazide; Chlorthalidone; Lisinopril; and Other  Family History  Problem Relation Age of Onset  . Hypertension Mother   . Hyperlipidemia Mother   . Heart attack Mother   . Hypertension Father   . Hyperlipidemia Father   . Heart attack Father   . Prostate cancer Neg Hx   . Bladder Cancer Neg Hx   . Kidney cancer Neg Hx     Social History Social History   Tobacco Use  . Smoking status: Former Smoker    Packs/day:  1.00    Years: 50.00    Pack years: 50.00    Types: Cigarettes  . Smokeless tobacco: Never Used  Substance Use Topics  . Alcohol use: Yes    Comment: social   . Drug use: No    Review of Systems Constitutional: No fever/chills Eyes: No visual changes. ENT: No sore throat. Cardiovascular: Denies chest pain. Respiratory: See HPI  Gastrointestinal: No abdominal pain.   Genitourinary: Negative for dysuria. Musculoskeletal: Negative for back pain. Skin: Negative for rash. Neurological: Negative for headaches, areas of focal weakness or numbness.    ____________________________________________   PHYSICAL EXAM:  VITAL SIGNS: ED Triage Vitals  Enc Vitals Group     BP      Pulse      Resp      Temp  Temp src      SpO2      Weight      Height      Head Circumference      Peak Flow      Pain Score      Pain Loc      Pain Edu?      Excl. in [place]?    Vitals:   04/12/19 1042 04/12/19 1212  BP: 135/60 135/60  Pulse: 64 (!) 59  Resp: 18 (!) 22  Temp: 98.2 F (36.8 C)   SpO2: 97% 100%     Constitutional: Alert and oriented.  Mild to moderately dyspneic.  Speaks in 2-3 word phrases but has to catch his breath between.  He is sitting up no acute extremitas Eyes: Conjunctivae are normal. Head: Atraumatic. Nose: No congestion/rhinnorhea. Mouth/Throat: Mucous membranes are moist. Neck: No stridor.  Cardiovascular: Normal rate, regular rhythm. Grossly normal heart sounds.  Good peripheral circulation. Respiratory: He speaks 2-3 word sentences.  Has to catch his breath in between, however his oxygen saturation remains normal on plus.  He does exhibit some mild accessory muscle use and does appear at least mild to moderately dyspneic.  Gastrointestinal: Soft and nontender. No distention. Musculoskeletal: No lower extremity tenderness but does have some mild edema in the lower legs bilaterally, no unilateral edema venous cords or congestion or calf tenderness. Neurologic:   Normal speech and language. No gross focal neurologic deficits are appreciated.  Skin:  Skin is warm, dry and intact. No rash noted. Psychiatric: Mood and affect are normal. Speech and behavior are normal.  ____________________________________________   LABS (all labs ordered are listed, but only abnormal results are displayed)  Labs Reviewed  TROPONIN I - Abnormal; Notable for the following components:      Result Value   Troponin I 0.03 (*)    All other components within normal limits  CBC - Abnormal; Notable for the following components:   RBC 3.50 (*)    Hemoglobin 9.5 (*)    HCT 29.0 (*)    All other components within normal limits  BASIC METABOLIC PANEL - Abnormal; Notable for the following components:   Sodium 133 (*)    CO2 20 (*)    Glucose, Bld 107 (*)    BUN 29 (*)    Creatinine, Ser 2.03 (*)    Calcium 8.0 (*)    GFR calc non Af Amer 30 (*)    GFR calc Af Amer 34 (*)    All other components within normal limits  BRAIN NATRIURETIC PEPTIDE - Abnormal; Notable for the following components:   B Natriuretic Peptide 182.0 (*)    All other components within normal limits  SARS CORONAVIRUS 2 (HOSPITAL ORDER, [place] [facility])   ____________________________________________  EKG  Reviewed and interpreted by me at 1040 Heart rate 65 QRS 90 QTc 400 Normal sinus rhythm, occasional PVC.  Somewhat flat T waves throughout, but no evidence of an acute ischemia ____________________________________________  RADIOLOGY  Dg Chest Portable 1 View  Result Date: 04/12/2019 CLINICAL DATA:  Shortness of breath. EXAM: PORTABLE CHEST 1 VIEW COMPARISON:  April 01, 2019 FINDINGS: Stable cardiomegaly. No overt edema. Mild atelectasis in the bases. No suspicious infiltrates. Mildly coarsened lung markings. IMPRESSION: 1. Mildly coarsened lung markings suggest the possibility of bronchitic change. 2. Mild opacity in the bases consistent with atelectasis.  Electronically Signed   By: Dorise Bullion III M.D   On: 04/12/2019 11:23  ____________________________________________   PROCEDURES  Procedure(s) performed: None  Procedures  Critical Care performed: No  ____________________________________________   INITIAL IMPRESSION / ASSESSMENT AND PLAN / ED COURSE  Pertinent labs & imaging results that were available during my care of the patient were reviewed by me and considered in my medical decision making (see chart for details).   Patient presents for dyspnea.  Having some since leaving the hospital and had notified his cardiologist of this at his last visit, but he reports it worsened quite a bit yesterday and he is very short of breath if he tries to lay down.  He is not having any acute chest pain, having a little bit of chest discomfort since the time of his STEMI but nothing acute.  He does not have any obvious signs or symptoms such as pleuritic pain to suggest PE, is not tachycardic, is not acutely hypoxic, and given his history and exam I suspect this is likely limited to some volume overload.  Coronavirus testing is negative.  Lab work shows elevated BNP, creatinine is chronically elevated but no worsening.  Discussed with Dr. Harrington Challenger of cardiology, patient will be admitted with cardiology consultation and further work-up for his dyspnea.  IV Lasix given as recommended by cardiology  ----------------------------------------- 12:58 PM on 04/12/2019 -----------------------------------------  The patient reports he started to feel just a little bit better, he has diuresed him at this time.  He continues to look slightly short of breath though slightly improved.  Discussed with Dr. Brett Albino, will admit to the medical service with cardiology consultation for further evaluation of his dyspnea in the setting of a recent STEMI    Patient agreeable  ____________________________________________   FINAL CLINICAL IMPRESSION(S) / ED DIAGNOSES   Final diagnoses:  Shortness of breath  Volume overload state of heart        Note:  This document was prepared using Dragon voice recognition software and may include unintentional dictation errors       Delman Kitten, MD 04/12/19 1258

## 2019-04-12 NOTE — Progress Notes (Signed)
Family Meeting Note  Advance Directive:no  Today a meeting took place with the Patient.  Patient is able to participate.  The following clinical team members were present during this meeting:MD  The following were discussed:Patient's diagnosis: acute on chronic combined systolic and diastolic CHF, Patient's progosis: Unable to determine and Goals for treatment: Full Code  Additional follow-up to be provided: prn  Time spent during discussion:20 minutes  Evette Doffing, MD

## 2019-04-13 ENCOUNTER — Inpatient Hospital Stay: Payer: Medicare HMO

## 2019-04-13 DIAGNOSIS — R0600 Dyspnea, unspecified: Secondary | ICD-10-CM

## 2019-04-13 DIAGNOSIS — I5023 Acute on chronic systolic (congestive) heart failure: Secondary | ICD-10-CM

## 2019-04-13 DIAGNOSIS — I2510 Atherosclerotic heart disease of native coronary artery without angina pectoris: Secondary | ICD-10-CM

## 2019-04-13 LAB — BASIC METABOLIC PANEL
Anion gap: 8 (ref 5–15)
BUN: 28 mg/dL — ABNORMAL HIGH (ref 8–23)
CO2: 21 mmol/L — ABNORMAL LOW (ref 22–32)
Calcium: 8 mg/dL — ABNORMAL LOW (ref 8.9–10.3)
Chloride: 105 mmol/L (ref 98–111)
Creatinine, Ser: 2.27 mg/dL — ABNORMAL HIGH (ref 0.61–1.24)
GFR calc Af Amer: 30 mL/min — ABNORMAL LOW (ref >60)
GFR calc non Af Amer: 26 mL/min — ABNORMAL LOW (ref >60)
Glucose, Bld: 96 mg/dL (ref 70–99)
Potassium: 4 mmol/L (ref 3.5–5.1)
Sodium: 134 mmol/L — ABNORMAL LOW (ref 135–145)

## 2019-04-13 LAB — CBC
HCT: 31.6 % — ABNORMAL LOW (ref 39.0–52.0)
Hemoglobin: 10.2 g/dL — ABNORMAL LOW (ref 13.0–17.0)
MCH: 27.1 pg (ref 26.0–34.0)
MCHC: 32.3 g/dL (ref 30.0–36.0)
MCV: 84 fL (ref 80.0–100.0)
Platelets: 232 10*3/uL (ref 150–400)
RBC: 3.76 MIL/uL — ABNORMAL LOW (ref 4.22–5.81)
RDW: 14.8 % (ref 11.5–15.5)
WBC: 5.7 10*3/uL (ref 4.0–10.5)
nRBC: 0 % (ref 0.0–0.2)

## 2019-04-13 MED ORDER — PRASUGREL HCL 10 MG PO TABS: 10.0000 mg | ORAL_TABLET | Freq: Every day | ORAL | Status: DC

## 2019-04-13 MED ORDER — PRASUGREL HCL 10 MG PO TABS
60.0000 mg | ORAL_TABLET | Freq: Once | ORAL | Status: DC
  Filled 2019-04-13: qty 6

## 2019-04-13 NOTE — TOC Initial Note (Signed)
Transition of Care [facility]) - Initial/Assessment Note    Patient Details  Name: [name] MRN: [id number] Date of Birth: 1935/08/11  Transition of Care [facility]) CM/SW Contact:    Annamaria Boots, [place] Phone Number: 04/13/2019, 3:19 PM  Clinical Narrative:   Patient is being recommended for SNF. CSW met with patient to discuss discharge plan. Patient reports that he was at [facility] from 4/15-4/23 and does NOT want to return there. Patient reports that prior to last hospitalization he lived alone and was able to care for himself. Patient states that he would go to short term rehab again but will not go to [facility]. Patient is agreeable to SNF and bed search. CSW will begin bed search and give offers once received. CSW will continue to follow for discharge planning.                 Expected Discharge Plan: Skilled Nursing Facility Barriers to Discharge: Continued Medical Work up, Ship broker, SNF Pending bed offer   Patient Goals and CMS Choice Patient states their goals for this hospitalization and ongoing recovery are:: I dont mind going to rehab again  CMS Medicare.gov Compare Post Acute Care list provided to:: Patient    Expected Discharge Plan and Services Expected Discharge Plan: [place]       Living arrangements for the past 2 months: Single Family Home                                      Prior Living Arrangements/Services Living arrangements for the past 2 months: Single Family Home Lives with:: Self Patient language and need for interpreter reviewed:: Yes Do you feel safe going back to the place where you live?: Yes      Need for Family Participation in Patient Care: No (Comment) Care giver support system in place?: No (comment)   Criminal Activity/Legal Involvement Pertinent to Current Situation/Hospitalization: No - Comment as needed  Activities of Daily Living Home Assistive Devices/Equipment: None ADL  Screening (condition at time of admission) Patient's cognitive ability adequate to safely complete daily activities?: Yes Is the patient deaf or have difficulty hearing?: No Does the patient have difficulty seeing, even when wearing glasses/contacts?: No Does the patient have difficulty concentrating, remembering, or making decisions?: No Patient able to express need for assistance with ADLs?: Yes Does the patient have difficulty dressing or bathing?: No Independently performs ADLs?: Yes (appropriate for developmental age) Does the patient have difficulty walking or climbing stairs?: No Weakness of Legs: None Weakness of Arms/Hands: None  Permission Sought/Granted Permission sought to share information with : Case Manager, Customer service manager, Family Supports Permission granted to share information with : Yes, Verbal Permission Granted              Emotional Assessment Appearance:: Appears stated age Attitude/Demeanor/Rapport: Guarded Affect (typically observed): Stable Orientation: : Oriented to Self, Oriented to Place, Oriented to  Time, Oriented to Situation Alcohol / Substance Use: Not Applicable Psych Involvement: No (comment)  Admission diagnosis:  Shortness of breath [R06.02] Volume overload state of heart [E87.79] Patient Active Problem List   Diagnosis Date Noted  . Acute respiratory failure with hypoxia ([place]) 04/12/2019  . STEMI (ST elevation myocardial infarction) ([place]) 04/01/2019  . STEMI involving right coronary artery ([place]) 04/01/2019  . Acute ST elevation myocardial infarction (STEMI) ([place]) 04/01/2019  . Lower extremity pain, bilateral 05/15/2018  .  Elevated troponin I level 07/31/2017  . Hyperkalemia 05/28/2017  . Syncope 05/27/2017  . Meningioma ([place]) 01/05/2017  . GI bleed 12/01/2016  . Alcohol abuse 10/30/2016  . Chronic systolic heart failure ([place]) 10/04/2016  . COPD (chronic obstructive pulmonary disease) with chronic bronchitis ([place])  10/04/2016  . Obstructive sleep apnea 10/04/2016  . Hyponatremia 08/31/2016  . UTI (lower urinary tract infection) 08/31/2016  . Exertional dyspnea 02/11/2016  . Hyperlipidemia 12/03/2015  . Essential hypertension   . Coronary artery disease 01/19/2004   PCP:  Marguerita Merles, MD Pharmacy:   [place], [place] - [address] [place] [address] Phone: [phone] Fax: [phone]     Social Determinants of Health (SDOH) Interventions    Readmission Risk Interventions No flowsheet data found.

## 2019-04-13 NOTE — Progress Notes (Signed)
Progress Note  Patient Name: [name] Date of Encounter: 04/13/2019  Primary Cardiologist: Kathlyn Sacramento, MD   Subjective   Patient is breathing a little better than yesterday HE says when he left hosp he was on SOB  (4/15)    Denies CP today  No pressures   Inpatient Medications    Scheduled Meds: . amLODipine  5 mg Oral Daily  . aspirin EC  81 mg Oral Daily  . atorvastatin  40 mg Oral Daily  . carvedilol  12.5 mg Oral BID WC  . enoxaparin (LOVENOX) injection  40 mg Subcutaneous Q24H  . furosemide  40 mg Intravenous BID  . isosorbide mononitrate  30 mg Oral Daily  . losartan  12.5 mg Oral Daily  . mometasone-formoterol  2 puff Inhalation BID  . ticagrelor  90 mg Oral BID   Continuous Infusions:  PRN Meds: acetaminophen **OR** acetaminophen, ipratropium-albuterol, nitroGLYCERIN, ondansetron **OR** ondansetron (ZOFRAN) IV, polyethylene glycol   Vital Signs    Vitals:   04/12/19 1504 04/12/19 2035 04/13/19 0429 04/13/19 0802  BP: 138/74 108/64 127/62 (!) 144/65  Pulse: (!) 59 73 69 (!) 57  Resp: (!) 22 20  19   Temp: 97.8 F (36.6 C) 98.2 F (36.8 C) 98.3 F (36.8 C) 97.8 F (36.6 C)  TempSrc: Oral Oral Oral Oral  SpO2: 100% 94% 100% 100%  Weight:   135.3 kg   Height:        Intake/Output Summary (Last 24 hours) at 04/13/2019 1112 Last data filed at 04/13/2019 0647 Gross per 24 hour  Intake -  Output 3425 ml  Net -3425 ml   Last 3 Weights 04/13/2019 04/12/2019 04/10/2019  Weight (lbs) 298 lb 4.8 oz 301 lb 304 lb  Weight (kg) 135.308 kg 136.533 kg 137.893 kg      Telemetry     SR  - Personally Reviewed  ECG      Physical Exam   GEN: No acute distress.  Obese  Neck: No JVD  Neck ful;   Cardiac: RRR, no murmurs, rubs, or gallops.  Respiratory: Clear to auscultation bilaterally. GI: Soft, nontender,  Obese  MS: No edema; No deformity. Neuro:  Nonfocal  Psych: Normal affect   Labs    Chemistry Recent Labs  Lab 04/12/19 1047 04/13/19  0510  NA 133* 134*  K 4.3 4.0  CL 104 105  CO2 20* 21*  GLUCOSE 107* 96  BUN 29* 28*  CREATININE 2.03* 2.27*  CALCIUM 8.0* 8.0*  GFRNONAA 30* 26*  GFRAA 34* 30*  ANIONGAP 9 8     Hematology Recent Labs  Lab 04/12/19 1047 04/13/19 0510  WBC 6.5 5.7  RBC 3.50* 3.76*  HGB 9.5* 10.2*  HCT 29.0* 31.6*  MCV 82.9 84.0  MCH 27.1 27.1  MCHC 32.8 32.3  RDW 14.6 14.8  PLT 246 232    Cardiac Enzymes Recent Labs  Lab 04/12/19 1047  TROPONINI 0.03*   No results for input(s): TROPIPOC in the last 168 hours.   BNP Recent Labs  Lab 04/12/19 1048  BNP 182.0*     DDimer No results for input(s): DDIMER in the last 168 hours.   Radiology    Dg Chest 1 View  Result Date: 04/13/2019 CLINICAL DATA:  Acute respiratory failure EXAM: CHEST  1 VIEW COMPARISON:  April 12, 2019 FINDINGS: Stable cardiomegaly. The hila and mediastinum are unremarkable. No focal infiltrate. Interstitial prominence without overt edema. IMPRESSION: Interstitial prominence may represent pulmonary venous congestion/mild edema. No focal  infiltrate. No other change. Electronically Signed   By: Dorise Bullion III M.D   On: 04/13/2019 06:56   Dg Chest Portable 1 View  Result Date: 04/12/2019 CLINICAL DATA:  Shortness of breath. EXAM: PORTABLE CHEST 1 VIEW COMPARISON:  April 01, 2019 FINDINGS: Stable cardiomegaly. No overt edema. Mild atelectasis in the bases. No suspicious infiltrates. Mildly coarsened lung markings. IMPRESSION: 1. Mildly coarsened lung markings suggest the possibility of bronchitic change. 2. Mild opacity in the bases consistent with atelectasis. Electronically Signed   By: Dorise Bullion III M.D   On: 04/12/2019 11:23    Cardiac Studies     Patient Profile     84 y.o. male with hx of CA, CHF   Recent PTCA/DES    Presented yesterday from home with SOB x 2 days      Assessment & Plan    1.  Dypsnea  Pt has diuresed some since admit with IV lasix   Is feeling a little better  Cr has  bumped   WIll hold lasix  ? If releated to Brilinta   HOld on further diuresis for now.    WIll discuss with pharmacy but will prob have to switch to Effient tomorrow since got am dose   Dietary to discuss 2 g NA diet   Pt did say he is watching salt   Does not add at home    2  CAD  Presented on 4/14 with STEMI   L ehart cath  With 20% LAD; 20% ramus;  LCx normal   RCA 90% prox, 80% mid, 95% mid; 10% distal (in stent)   PT underwent DES x 2 to mid RCA  Sent home on Brilinata and ASA   EKG without acute changes   Trop neg  If stent were a problem he would have had acute closure   3  CHF  LVEF limitied on last admit  Poor windows   LVEF probably 40 to 45%   Echo difficult on last visit    I have reivewed images   I would not order now    4  AS    Pt with probable midl to mod AS   Difficult echo   Will follow      For questions or updates, please contact [facility] Please consult [email or web address] for contact info under        Signed, Dorris Carnes, MD  04/13/2019, 11:12 AM

## 2019-04-13 NOTE — Progress Notes (Signed)
[place] [facility] [place] NAME: [name]    MR#:  [id number]  DATE OF BIRTH:  1935-07-26  SUBJECTIVE:  CHIEF COMPLAINT: Resting comfortably reports exertional dyspnea  REVIEW OF SYSTEMS:  CONSTITUTIONAL: No fever, fatigue or weakness.  EYES: No blurred or double vision.  EARS, NOSE, AND THROAT: No tinnitus or ear pain.  RESPIRATORY: No cough, reports exertional shortness of breath, denies wheezing or hemoptysis.  CARDIOVASCULAR: No chest pain, orthopnea, edema.  GASTROINTESTINAL: No nausea, vomiting, diarrhea or abdominal pain.  GENITOURINARY: No dysuria, hematuria.  ENDOCRINE: No polyuria, nocturia,  HEMATOLOGY: No anemia, easy bruising or bleeding SKIN: No rash or lesion. MUSCULOSKELETAL: No joint pain or arthritis.   NEUROLOGIC: No tingling, numbness, weakness.  PSYCHIATRY: No anxiety or depression.   DRUG ALLERGIES:   Allergies  Allergen Reactions  . Amlodipine Other (See Comments)    Constipation  Constipation Constipation  . Benazepril-Hydrochlorothiazide Other (See Comments)    Constipation  Constipation Constipation  . Chlorthalidone Other (See Comments)    Hyponatremia Hyponatremia  . Lisinopril Other (See Comments)  . Other Other (See Comments)    "ANY BLOOD PRESSURE MEDICATIONS THAT I'VE TRIED" - PT. DOES NOT REMEMBER WHICH ONES "ANY BLOOD PRESSURE MEDICATIONS THAT I'VE TRIED"- PT. DOES NOT REMEMBER WHICH ONES- CONSTIPATION    VITALS:  Blood pressure (!) 144/65, pulse (!) 57, temperature 97.8 F (36.6 C), temperature source Oral, resp. rate 19, height 5\' 7"  (1.702 m), weight 135.3 kg, SpO2 93 %.  PHYSICAL EXAMINATION:  GENERAL:  84 y.o.-year-old patient lying in the bed with no acute distress.  EYES: Pupils equal, round, reactive to light and accommodation. No scleral icterus. Extraocular muscles intact.  HEENT: Head atraumatic, normocephalic. Oropharynx and nasopharynx clear.  NECK:  Supple, no jugular  venous distention. No thyroid enlargement, no tenderness.  LUNGS: Moderately diminished breath sounds bilaterally, no wheezing, rales,rhonchi or crepitation. No use of accessory muscles of respiration.  CARDIOVASCULAR: S1, S2 normal. No murmurs, rubs, or gallops.  ABDOMEN: Soft, nontender, nondistended. Bowel sounds present.  EXTREMITIES: Positive pedal edema, no cyanosis, or clubbing.  NEUROLOGIC: Sensation intact. Gait not checked.  PSYCHIATRIC: The patient is alert and oriented x 3.  SKIN: No obvious rash, lesion, or ulcer.    LABORATORY PANEL:   CBC Recent Labs  Lab 04/13/19 0510  WBC 5.7  HGB 10.2*  HCT 31.6*  PLT 232   ------------------------------------------------------------------------------------------------------------------  Chemistries  Recent Labs  Lab 04/13/19 0510  NA 134*  K 4.0  CL 105  CO2 21*  GLUCOSE 96  BUN 28*  CREATININE 2.27*  CALCIUM 8.0*   ------------------------------------------------------------------------------------------------------------------  Cardiac Enzymes Recent Labs  Lab 04/12/19 1047  TROPONINI 0.03*   ------------------------------------------------------------------------------------------------------------------  RADIOLOGY:  Dg Chest 1 View  Result Date: 04/13/2019 CLINICAL DATA:  Acute respiratory failure EXAM: CHEST  1 VIEW COMPARISON:  April 12, 2019 FINDINGS: Stable cardiomegaly. The hila and mediastinum are unremarkable. No focal infiltrate. Interstitial prominence without overt edema. IMPRESSION: Interstitial prominence may represent pulmonary venous congestion/mild edema. No focal infiltrate. No other change. Electronically Signed   By: Dorise Bullion III M.D   On: 04/13/2019 06:56   Dg Chest Portable 1 View  Result Date: 04/12/2019 CLINICAL DATA:  Shortness of breath. EXAM: PORTABLE CHEST 1 VIEW COMPARISON:  April 01, 2019 FINDINGS: Stable cardiomegaly. No overt edema. Mild atelectasis in the bases. No  suspicious infiltrates. Mildly coarsened lung markings. IMPRESSION: 1. Mildly coarsened lung markings suggest the possibility of bronchitic change. 2.  Mild opacity in the bases consistent with atelectasis. Electronically Signed   By: Dorise Bullion III M.D   On: 04/12/2019 11:23    EKG:   Orders placed or performed during the hospital encounter of 04/12/19  . ED EKG  . ED EKG    ASSESSMENT AND PLAN:    Acute hypoxic respiratory failure-likely secondary to acute on chronic combined systolic and diastolic heart failure.  Patient with recent STEMI 10 days ago. No wheezing to suggest COPD exacerbation. Requiring 2 L O2 in the ED. -COVID testing negative -Received Lasix 80 mg IV x1 in the ED, will continue Lasix 40 mg IV twice daily -Cardiology consult placed Dr. Harrington Challenger is following -Repeat chest x-ray in the morning - procalcitonin normal -Cardiac monitoring -Wean O2 as able  CAD with recent inferior STEMI 4/14 s/p PCI- no active chest pain. -Continue home aspirin, Coreg, Imdur, losartan -Continue Brilinta tonight and cardiology is considering to start Effient from tomorrow.  Dr. Harrington Challenger will discuss with pharmacy regarding the same  Hypertension- normotensive in the ED -Continue home norvasc, losartan, coreg  CKD stage III-stable.  Creatinine at baseline. -Avoid nephrotoxic agents -Monitor creatinine closely in the setting of IV diuresis  COPD-stable, no signs of acute exacerbation. -Continue home inhalers -DuoNebs PRN  Hyperlipidemia-stable -Continue home Lipitor  pT recommending skilled nursing facility  All the records are reviewed and case discussed with Care Management/Social Workerr. Management plans discussed with the patient, he is in agreement.  CODE STATUS: fc   TOTAL TIME TAKING CARE OF THIS PATIENT: 36  minutes.   POSSIBLE D/C IN 2 DAYS, DEPENDING ON CLINICAL CONDITION.  Note: This dictation was prepared with Dragon dictation along with smaller phrase  technology. Any transcriptional errors that result from this process are unintentional.   Nicholes Mango M.D on 04/13/2019 at 1:39 PM  Between 7am to 6pm - Pager - [phone] After 6pm go to [email or web address] - password [email or web address] [facility]  [facility]  Office  [phone]  CC: Primary care physician; Marguerita Merles, MD

## 2019-04-13 NOTE — NC FL2 (Signed)
[place] LEVEL OF CARE SCREENING TOOL     IDENTIFICATION  Patient Name: [name] Birthdate: 03/11/1935 Sex: male Admission Date (Current Location): 04/12/2019  [place] and [place] Number:  Engineering geologist and Address:  [facility], [address] [place], [place] [address]      Provider Number: [id number]  Attending Physician Name and Address:  Nicholes Mango, MD  Relative Name and Phone Number:       Current Level of Care: Hospital Recommended Level of Care: [place] Prior Approval Number:    Date Approved/Denied:   PASRR Number: [id number]  Discharge Plan: SNF    Current Diagnoses: Patient Active Problem List   Diagnosis Date Noted  . Acute respiratory failure with hypoxia ([place]) 04/12/2019  . STEMI (ST elevation myocardial infarction) ([place]) 04/01/2019  . STEMI involving right coronary artery ([place]) 04/01/2019  . Acute ST elevation myocardial infarction (STEMI) ([place]) 04/01/2019  . Lower extremity pain, bilateral 05/15/2018  . Elevated troponin I level 07/31/2017  . Hyperkalemia 05/28/2017  . Syncope 05/27/2017  . Meningioma ([place]) 01/05/2017  . GI bleed 12/01/2016  . Alcohol abuse 10/30/2016  . Chronic systolic heart failure ([place]) 10/04/2016  . COPD (chronic obstructive pulmonary disease) with chronic bronchitis ([place]) 10/04/2016  . Obstructive sleep apnea 10/04/2016  . Hyponatremia 08/31/2016  . UTI (lower urinary tract infection) 08/31/2016  . Exertional dyspnea 02/11/2016  . Hyperlipidemia 12/03/2015  . Essential hypertension   . Coronary artery disease 01/19/2004    Orientation RESPIRATION BLADDER Height & Weight     Self, Time, Situation, Place  Normal Continent Weight: 298 lb 4.8 oz (135.3 kg) Height:  5\' 7"  (170.2 cm)  BEHAVIORAL SYMPTOMS/MOOD NEUROLOGICAL BOWEL NUTRITION STATUS  (none) (none) Continent Diet(Heart Healthy )  AMBULATORY STATUS COMMUNICATION OF NEEDS Skin   Extensive  Assist Verbally Normal                       Personal Care Assistance Level of Assistance  Bathing, Feeding, Dressing Bathing Assistance: Limited assistance Feeding assistance: Independent Dressing Assistance: Limited assistance     Functional Limitations Info  Sight, Hearing, Speech Sight Info: Adequate Hearing Info: Adequate Speech Info: Adequate    SPECIAL CARE FACTORS FREQUENCY  PT (By licensed PT), OT (By licensed OT)     PT Frequency: 5 OT Frequency: 5            Contractures Contractures Info: Not present    Additional Factors Info  Code Status, Allergies Code Status Info: Full Code  Allergies Info: Amlodipine, Benazepril-hydrochlorothiazide, Chlorthalidone, Lisinopril, Other           Current Medications (04/13/2019):  This is the current hospital active medication list Current Facility-Administered Medications  Medication Dose Route Frequency Provider Last Rate Last Dose  . acetaminophen (TYLENOL) tablet 650 mg  650 mg Oral Q6H PRN Sela Hua, MD   650 mg at 04/12/19 2122   Or  . acetaminophen (TYLENOL) suppository 650 mg  650 mg Rectal Q6H PRN Mayo, Pete Pelt, MD      . amLODipine (NORVASC) tablet 5 mg  5 mg Oral Daily Mayo, Pete Pelt, MD   5 mg at 04/13/19 0830  . aspirin EC tablet 81 mg  81 mg Oral Daily Mayo, Pete Pelt, MD   81 mg at 04/13/19 0830  . atorvastatin (LIPITOR) tablet 40 mg  40 mg Oral Daily Mayo, Pete Pelt, MD      . carvedilol (COREG)  tablet 12.5 mg  12.5 mg Oral BID WC Mayo, Pete Pelt, MD   12.5 mg at 04/13/19 0830  . enoxaparin (LOVENOX) injection 40 mg  40 mg Subcutaneous Q24H Mayo, Pete Pelt, MD   40 mg at 04/12/19 2122  . ipratropium-albuterol (DUONEB) 0.5-2.5 (3) MG/3ML nebulizer solution 3 mL  3 mL Nebulization Q6H PRN Mayo, Pete Pelt, MD      . isosorbide mononitrate (IMDUR) 24 hr tablet 30 mg  30 mg Oral Daily Mayo, Pete Pelt, MD   30 mg at 04/13/19 0830  . losartan (COZAAR) tablet 12.5 mg  12.5 mg Oral Daily Mayo, Pete Pelt, MD   12.5 mg at 04/13/19 0830  . mometasone-formoterol (DULERA) 200-5 MCG/ACT inhaler 2 puff  2 puff Inhalation BID Mayo, Pete Pelt, MD   2 puff at 04/13/19 0831  . nitroGLYCERIN (NITROSTAT) SL tablet 0.4 mg  0.4 mg Sublingual Q5 min PRN Mayo, Pete Pelt, MD      . ondansetron [facility]) tablet 4 mg  4 mg Oral Q6H PRN Mayo, Pete Pelt, MD       Or  . ondansetron [facility]) injection 4 mg  4 mg Intravenous Q6H PRN Mayo, Pete Pelt, MD      . polyethylene glycol (MIRALAX / GLYCOLAX) packet 17 g  17 g Oral Daily PRN Mayo, Pete Pelt, MD      . ticagrelor [facility]) tablet 90 mg  90 mg Oral BID Sela Hua, MD   90 mg at 04/13/19 0830     Discharge Medications: Please see discharge summary for a list of discharge medications.  Relevant Imaging Results:  Relevant Lab Results:   Additional Information SSN: [id number]  Annamaria Boots, [place]

## 2019-04-13 NOTE — Progress Notes (Signed)
Patients BP 95/34, held coreg and notified MD.

## 2019-04-13 NOTE — Evaluation (Signed)
Physical Therapy Evaluation Patient Details Name: [name] MRN: [id number] DOB: [birth year] Today's Date: 04/13/2019   History of Present Illness  Patient is a 84 year old man admitted for acute respiratory failure with hypoxia. Patient was recently hospitalized for inferior STEMI s/p primary PCI 04/01/19 and went to [facility] healthcare. He reports he did not recieve therapy or medication there and left to go home. Upon returning home for two days a nurse came to his house and told him he needed to go to the hospital. Patient PMH includes: alcoholism, asthma, bipolar disorder, CHF, CKD, COPD, CAD, HTN, HLD, ischemic cardiomyopathy, prostate cancer, and PVD.   Clinical Impression  Patient's a 84 year old male who presents with generalized weakness and limited mobility secondary to bilateral knee pain and respiratory failure. Patient reports pain in bilateral knees is baseline 7/10 increasing to 9-10/10 with standing. Oxygen monitored throughout session maintaining SP02>90 on room air with mobility. Patient demonstrated ability to transfer from bed to chair however requires CGA due to buckling of bilateral knees. Nursing informed patient will need CGA for bedside commode transfers. Patient did not demonstrate ability to ambulate or static stand >1 minute prior to LOB. Patient would benefit from potential wheelchair (power or scooter) evaluation due to available ROM of BUE and limited ability to weightbear on BLE's. While hospitalized patient will benefit from skilled physical therapy to improve LE strength, decrease pain, and increase safe transfers. Upon discharge patient would benefit from SNF placement due to limited mobility and high falls risk.     Follow Up Recommendations SNF    Equipment Recommendations  Other (comment)(potentially benefit from wheelchair or scooter)    Recommendations for Other Services       Precautions / Restrictions Restrictions Weight Bearing Restrictions: No       Mobility  Bed Mobility Overal bed mobility: Modified Independent             General bed mobility comments: extra time, uses bedrails to sit up   Transfers Overall transfer level: Needs assistance   Transfers: Stand Pivot Transfers;Sit to/from Stand Sit to Stand: Min guard Stand pivot transfers: Min guard       General transfer comment: Patient initially buckles bilateral knees however maintains standing without LOB, able to transition to/from chair to bed with CGA. Pain increased to 9/10   Ambulation/Gait             General Gait Details: deferred due to safety concerns  Stairs            Wheelchair Mobility    Modified Rankin (Stroke Patients Only)       Balance Overall balance assessment: Needs assistance Sitting-balance support: Feet supported Sitting balance-Leahy Scale: Good Sitting balance - Comments: able to reach gently within BOS    Standing balance support: Single extremity supported Standing balance-Leahy Scale: Poor Standing balance comment: touches surface tranferring to, unable to static stand upright without knee buckling                             Pertinent Vitals/Pain Pain Assessment: 0-10 Pain Score: 7  Pain Location: bilateral knees at baseline are 7/10, patient reports up to 9/10 with weightbearing  Pain Descriptors / Indicators: Grimacing;Moaning;Guarding Pain Intervention(s): Limited activity within patient's tolerance;Repositioned;Monitored during session    Home Living Family/patient expects to be discharged to:: Private residence Living Arrangements: Alone Available Help at Discharge: Family;Available PRN/intermittently Type of Home: Apartment Home Access: Stairs  to enter(front of apartment complex, lives on 4th floor uses elevator) Entrance Stairs-Rails: Chemical engineer of Steps: 3 Home Layout: One level Home Equipment: Environmental consultant - 4 wheels;Cane - single point;Shower seat;Grab bars -  tub/shower Additional Comments: Patient states he lives on the fourth floor, uses the elevator to get to his apartment,     Prior Function Level of Independence: Needs assistance   Gait / Transfers Assistance Needed: Pt performs stand pivot transfers to rollator, sits in rollator all day and uses this to move around his apartment.   ADL's / Homemaking Assistance Needed: reported performing own ADLs, stated it is getting harder to perform. Daughter assists with grocery trips/MD appts, not always able to make it to the restroom in time  Comments: Patient has not ambulated since his last hospitalization, he sits on his rollator and wheels around his apt that way, has accidents due to not making it to the restroom in time.      Hand Dominance   Dominant Hand: Left    Extremity/Trunk Assessment   Upper Extremity Assessment Upper Extremity Assessment: RUE deficits/detail;LUE deficits/detail RUE Deficits / Details: unable to lift >90 degrees, painful  RUE: Unable to fully assess due to pain RUE Coordination: decreased gross motor LUE Deficits / Details: unable to lift >90 degrees, painful  LUE: Unable to fully assess due to pain LUE Coordination: decreased gross motor    Lower Extremity Assessment Lower Extremity Assessment: RLE deficits/detail;LLE deficits/detail RLE Deficits / Details: hips: 4-/5 knees 3/5 ankles 3+/5 painful  RLE: Unable to fully assess due to pain RLE Coordination: decreased gross motor LLE Deficits / Details: hips: 4-/5 knees 3/5 ankles 3+/5 painful  LLE: Unable to fully assess due to pain LLE Coordination: decreased gross motor       Communication   Communication: No difficulties  Cognition Arousal/Alertness: Awake/alert Behavior During Therapy: WFL for tasks assessed/performed Overall Cognitive Status: Within Functional Limits for tasks assessed                                 General Comments: A and o x 4, not eager to participate in  therapy       General Comments General comments (skin integrity, edema, etc.): bilateral knee effusion     Exercises Other Exercises Other Exercises: patient educated on NWB interventions for knees Other Exercises: patient educated on safe transfers for mobility, unable to ambulate at this time    Assessment/Plan    PT Assessment Patient needs continued PT services  PT Problem List Decreased strength;Pain;Decreased range of motion;Decreased activity tolerance;Decreased balance;Decreased safety awareness;Decreased mobility;Decreased knowledge of precautions;Obesity       PT Treatment Interventions DME instruction;Therapeutic exercise;Gait training;Balance training;Wheelchair mobility training;Stair training;Neuromuscular re-education;Functional mobility training;Therapeutic activities;Patient/family education    PT Goals (Current goals can be found in the Care Plan section)  Acute Rehab PT Goals Patient Stated Goal: to go to assisted living/SNF and have less pain in knees PT Goal Formulation: With patient Time For Goal Achievement: 04/27/19 Potential to Achieve Goals: Fair    Frequency Min 2X/week   Barriers to discharge Inaccessible home environment;Decreased caregiver support would benefit from assisted living/SNF    Co-evaluation               AM-PAC PT "6 Clicks" Mobility  Outcome Measure Help needed turning from your back to your side while in a flat bed without using bedrails?: None Help needed moving from lying on  your back to sitting on the side of a flat bed without using bedrails?: A Little Help needed moving to and from a bed to a chair (including a wheelchair)?: A Little Help needed standing up from a chair using your arms (e.g., wheelchair or bedside chair)?: A Little Help needed to walk in hospital room?: Total Help needed climbing 3-5 steps with a railing? : Total 6 Click Score: 15    End of Session Equipment Utilized During Treatment: Gait  belt Activity Tolerance: Patient limited by fatigue;Patient limited by pain Patient left: in bed;with call bell/phone within reach;with bed alarm set Nurse Communication: Mobility status;Precautions(oxygen saturation >90 on room air) PT Visit Diagnosis: Difficulty in walking, not elsewhere classified (R26.2);Muscle weakness (generalized) (M62.81);Other abnormalities of gait and mobility (R26.89);Pain;Unsteadiness on feet (R26.81) Pain - Right/Left: Right(and L) Pain - part of body: Knee    Time: 7185-5015 PT Time Calculation (min) (ACUTE ONLY): 32 min   Charges:   PT Evaluation $PT Eval Low Complexity: 1 Low PT Treatments $Therapeutic Activity: 8-22 mins        Janna Arch, PT, DPT    Janna Arch 04/13/2019, 12:15 PM

## 2019-04-13 NOTE — Plan of Care (Signed)
Nutrition Education Note RD working remotely.  RD consulted for nutrition education regarding CHF.  84 year old male with PMHx of EtOH abuse, asthma, bipolar affective disorder, chronic combined systolic and diastolic CHF (LV EF 16-10% on 04/01/2019), CKD, COPD, CAD, HTN, HLD, PVD, tobacco abuse, hx prostate cancer, recent STEMI s/p PCI/DES to to proximal and mid RCA on 04/01/2019 now admitted with acute hypoxic respiratory likely related to acute on chronic combined systolic and diastolic heart failure.  Spoke with patient over the phone. He reports he has a good appetite and intake at baseline. He prepares his own meals at home. He has been following a low sodium diet for "years" now per his report. He chooses fresh or frozen foods and tries to avoid canned or processed foods. Also avoids fried food. He does not use salt in cooking or at the table. He does report that lately he has been choosing more "frozen meals" for ease of cooking and he has not looked at the sodium content of those. Discussed importance of checking sodium content on frozen foods as they can still be high in sodium. Patient also has not previously been on a very strict fluid restriction. We discussed the fluid restriction ordered by MD (1.5 L = 6.25 8-fl-oz cups). Also discussed importance of weighing self daily. He reports he does not currently have a scale at home.  RD reviewed "Heart Failure Nutrition Therapy" handout from the [employer] with patient over the phone. Will mail handout to patient. Reviewed patient's dietary recall. Provided examples on ways to decrease sodium intake in diet. Discouraged intake of processed foods and use of salt shaker. Encouraged fresh fruits and vegetables as well as whole grain sources of carbohydrates to maximize fiber intake.   RD discussed why it is important for patient to adhere to diet recommendations, and emphasized the role of fluids, foods to avoid, and importance  of weighing self daily. Teach back method used.  Expect fair to good compliance.  Body mass index is 46.72 kg/m. Pt meets criteria for obesity class III based on current BMI.  Current diet order is heart healthy with 1.5 L fluid restriction. Labs and medications reviewed. No further nutrition interventions warranted at this time. RD contact information provided. If additional nutrition issues arise, please re-consult RD.   Willey Blade, MS, [place], LDN Office: [phone] Pager: [phone] After Hours/Weekend Pager: [phone]

## 2019-04-14 LAB — CBC WITH DIFFERENTIAL/PLATELET
Abs Immature Granulocytes: 0.03 10*3/uL (ref 0.00–0.07)
Basophils Absolute: 0 10*3/uL (ref 0.0–0.1)
Basophils Relative: 1 %
Eosinophils Absolute: 0.1 10*3/uL (ref 0.0–0.5)
Eosinophils Relative: 2 %
HCT: 30.3 % — ABNORMAL LOW (ref 39.0–52.0)
Hemoglobin: 10 g/dL — ABNORMAL LOW (ref 13.0–17.0)
Immature Granulocytes: 1 %
Lymphocytes Relative: 20 %
Lymphs Abs: 1.2 10*3/uL (ref 0.7–4.0)
MCH: 27.3 pg (ref 26.0–34.0)
MCHC: 33 g/dL (ref 30.0–36.0)
MCV: 82.8 fL (ref 80.0–100.0)
Monocytes Absolute: 0.9 10*3/uL (ref 0.1–1.0)
Monocytes Relative: 14 %
Neutro Abs: 3.9 10*3/uL (ref 1.7–7.7)
Neutrophils Relative %: 62 %
Platelets: 252 10*3/uL (ref 150–400)
RBC: 3.66 MIL/uL — ABNORMAL LOW (ref 4.22–5.81)
RDW: 14.8 % (ref 11.5–15.5)
WBC: 6.1 10*3/uL (ref 4.0–10.5)
nRBC: 0 % (ref 0.0–0.2)

## 2019-04-14 LAB — BASIC METABOLIC PANEL
Anion gap: 10 (ref 5–15)
BUN: 33 mg/dL — ABNORMAL HIGH (ref 8–23)
CO2: 24 mmol/L (ref 22–32)
Calcium: 8.4 mg/dL — ABNORMAL LOW (ref 8.9–10.3)
Chloride: 102 mmol/L (ref 98–111)
Creatinine, Ser: 2.49 mg/dL — ABNORMAL HIGH (ref 0.61–1.24)
GFR calc Af Amer: 27 mL/min — ABNORMAL LOW (ref >60)
GFR calc non Af Amer: 23 mL/min — ABNORMAL LOW (ref >60)
Glucose, Bld: 109 mg/dL — ABNORMAL HIGH (ref 70–99)
Potassium: 4.3 mmol/L (ref 3.5–5.1)
Sodium: 136 mmol/L (ref 135–145)

## 2019-04-14 MED ORDER — TRAMADOL HCL 50 MG PO TABS
50.0000 mg | ORAL_TABLET | Freq: Three times a day (TID) | ORAL | Status: DC | PRN
  Administered 2019-04-14: 50 mg via ORAL
  Filled 2019-04-14: qty 1

## 2019-04-14 MED ORDER — FUROSEMIDE 40 MG PO TABS
40.0000 mg | ORAL_TABLET | Freq: Every day | ORAL | Status: DC
  Administered 2019-04-14 – 2019-04-15 (×2): 40 mg via ORAL
  Filled 2019-04-14 (×2): qty 1

## 2019-04-14 MED ORDER — CLOPIDOGREL BISULFATE 75 MG PO TABS
600.0000 mg | ORAL_TABLET | Freq: Once | ORAL | Status: AC
  Administered 2019-04-14: 600 mg via ORAL
  Filled 2019-04-14: qty 8

## 2019-04-14 MED ORDER — CLOPIDOGREL BISULFATE 75 MG PO TABS
75.0000 mg | ORAL_TABLET | Freq: Every day | ORAL | Status: DC
  Administered 2019-04-15: 75 mg via ORAL
  Filled 2019-04-14: qty 1

## 2019-04-14 MED ORDER — AMLODIPINE BESYLATE 5 MG PO TABS
2.5000 mg | ORAL_TABLET | Freq: Every day | ORAL | Status: DC
  Administered 2019-04-15: 2.5 mg via ORAL
  Filled 2019-04-14: qty 1

## 2019-04-14 MED ORDER — SODIUM CHLORIDE 0.9% FLUSH: 3.0000 mL | INTRAVENOUS | Status: DC | PRN

## 2019-04-14 MED ORDER — SODIUM CHLORIDE 0.9% FLUSH
3.0000 mL | Freq: Two times a day (BID) | INTRAVENOUS | Status: DC
  Administered 2019-04-14 – 2019-04-15 (×2): 3 mL via INTRAVENOUS

## 2019-04-14 NOTE — Progress Notes (Signed)
Nutrition Brief Note  Received another consult to educate patient. Please see education note from yesterday (4/26). Patient was educated on 2 gram sodium diet with 1.5 L fluid restriction per MD consult. Of note, patient reported to RD he does not have a digital scale at home to weigh himself daily.  No further nutrition needs at this time.  Willey Blade, MS, [place], LDN Office: [phone] Pager: [phone] After Hours/Weekend Pager: [phone]

## 2019-04-14 NOTE — Progress Notes (Signed)
Progress Note  Patient Name: [name] Date of Encounter: 04/14/2019  Primary Cardiologist: Kathlyn Sacramento, MD   Subjective   Patient reporting worsening breathing today.  He stated he woke up with worse SOB/DOE today. He also reports LEE, consistent with exam as below.   He reports mild, nonradiating, nonpleuritic, and central chest pressure that comes and goes without any identifiable aggravating or alleviating factors. He reportedly "hurts all over" and stated he is somewhat frustrated today.    Inpatient Medications    Scheduled Meds: . amLODipine  5 mg Oral Daily  . aspirin EC  81 mg Oral Daily  . atorvastatin  40 mg Oral Daily  . carvedilol  12.5 mg Oral BID WC  . [START ON 04/15/2019] clopidogrel  75 mg Oral Daily  . enoxaparin (LOVENOX) injection  40 mg Subcutaneous Q24H  . isosorbide mononitrate  30 mg Oral Daily  . losartan  12.5 mg Oral Daily  . mometasone-formoterol  2 puff Inhalation BID   Continuous Infusions:  PRN Meds: acetaminophen **OR** acetaminophen, ipratropium-albuterol, nitroGLYCERIN, ondansetron **OR** ondansetron (ZOFRAN) IV, polyethylene glycol, traMADol   Vital Signs    Vitals:   04/13/19 1949 04/14/19 0426 04/14/19 0743 04/14/19 0908  BP: 122/64 120/62 126/64   Pulse: 71 71 63 66  Resp:  20    Temp: 98.4 F (36.9 C) 98 F (36.7 C) 98 F (36.7 C)   TempSrc: Oral Oral Oral   SpO2: 99% 99% 99%   Weight:  135.8 kg    Height:        Intake/Output Summary (Last 24 hours) at 04/14/2019 1112 Last data filed at 04/14/2019 1009 Gross per 24 hour  Intake 360 ml  Output 1750 ml  Net -1390 ml   Filed Weights   04/12/19 1043 04/13/19 0429 04/14/19 0426  Weight: (!) 136.5 kg 135.3 kg 135.8 kg    Telemetry    SR, rates 60-70s - Personally Reviewed  ECG    No new tracings- Personally Reviewed  Physical Exam   GEN: No acute distress.   Neck:  JVD difficult to assess due to body habitus Cardiac:  Distant heart sounds, RRR, no  murmurs, rubs, or gallops.  Respiratory:  Bilateral diffuse wheezing, rhonchi, and with coarse breath sounds.  Increased respiratory effort. Reduced breath sounds at the bases. GI: Obese, TTP in RUQ, somewhat distended MS: 1+ bilateral lower extremity edema to level of shins; No deformity. Neuro:  Nonfocal  Psych: Normal affect, slightly agitated   Labs    Chemistry Recent Labs  Lab 04/12/19 1047 04/13/19 0510  NA 133* 134*  K 4.3 4.0  CL 104 105  CO2 20* 21*  GLUCOSE 107* 96  BUN 29* 28*  CREATININE 2.03* 2.27*  CALCIUM 8.0* 8.0*  GFRNONAA 30* 26*  GFRAA 34* 30*  ANIONGAP 9 8     Hematology Recent Labs  Lab 04/12/19 1047 04/13/19 0510  WBC 6.5 5.7  RBC 3.50* 3.76*  HGB 9.5* 10.2*  HCT 29.0* 31.6*  MCV 82.9 84.0  MCH 27.1 27.1  MCHC 32.8 32.3  RDW 14.6 14.8  PLT 246 232    Cardiac Enzymes Recent Labs  Lab 04/12/19 1047  TROPONINI 0.03*   No results for input(s): TROPIPOC in the last 168 hours.   BNP Recent Labs  Lab 04/12/19 1048  BNP 182.0*     DDimer No results for input(s): DDIMER in the last 168 hours.   Radiology    Dg Chest 1 View  Result  Date: 04/13/2019 CLINICAL DATA:  Acute respiratory failure EXAM: CHEST  1 VIEW COMPARISON:  April 12, 2019 FINDINGS: Stable cardiomegaly. The hila and mediastinum are unremarkable. No focal infiltrate. Interstitial prominence without overt edema. IMPRESSION: Interstitial prominence may represent pulmonary venous congestion/mild edema. No focal infiltrate. No other change. Electronically Signed   By: Dorise Bullion III M.D   On: 04/13/2019 06:56    Cardiac Studies   04/01/19 LHC Conclusions: 1. Severe single-vessel coronary artery disease with sequential 80-95% proximal and mid RCA stenoses with heavy thrombus causing inferior STEMI. 2. Mild, non-obstructive coronary artery disease involving the left coronary artery. 3. Patent distal RCA Taxus stent with mild in-stent restenosis. Challenging but  ultimately successful PCI to the proximal and mid RCA lesions with non-overlapping Resolute Onyx 4.0 x 26 mm (proximal - dilated to 4.4 mm) and 4.0 x 15 mm (distal - dilated to 4.2 mm) drug-eluting stents with 0% residual stenosis and TIMI-3 flow.  04/01/19 TTE  1. Severe hypokinesis of the left ventricular, entire inferior wall and inferoseptal wall.  2. The left ventricle has mildly reduced systolic function, with an ejection fraction of 45-50%. The cavity size was normal. There is severely increased left ventricular wall thickness. Left ventricular diastolic function could not be evaluated due to  nondiagnostic images.  3. Left atrial size was not well visualized.  4. The mitral valve was not well visualized. There is mild mitral annular calcification present.  5. The aortic valve is tricuspid. Mild thickening of the aortic valve. Mild calcification of the aortic valve. Aortic valve regurgitation was not assessed by color flow Doppler.  6. Degree of aortic stenosis could not be assessed due to lack of apical windows for pulse wave Doppler of the LVOT/AoV. However, it does not appear to be severe.  7. Moderately dilated pulmonary artery.  8. The interatrial septum was not well visualized.  07/31/2017 TTE Procedure narrative: Transthoracic echocardiography. The study   was technically difficult. - Left ventricle: The cavity size was at the upper limits of   normal. Wall thickness was increased in a pattern of moderate   LVH. Systolic function was mildly to moderately reduced. The   estimated ejection fraction was in the range of 40% to 45%.   Doppler parameters are consistent with abnormal left ventricular   relaxation (grade 1 diastolic dysfunction). Doppler parameters   are consistent with high ventricular filling pressure. - Aortic valve: Trileaflet; mildly thickened, mildly calcified   leaflets. Valve mobility was mildly restricted. Transvalvular   velocity was minimally increased.  There was mild to moderate   stenosis. Valve area (VTI): 1.67 cm^2. Valve area (Vmax): 1.25   cm^2. Valve area (Vmean): 1.29 cm^2. - Left atrium: The atrium was mildly dilated. - Right ventricle: The cavity size was normal. Systolic function   was normal. - Right atrium: The atrium was mildly dilated.   Patient Profile     84 y.o. male with history of CAD (s/p TAXUS stent to RCA 2005), inferior STEMI (s/p 04/01/19 DES x2 RCA), systolic/diastolic CHF (EF 97-35%), moderate AS (per 2018 echo), CKD III, HLD, HTN, tobacco abuse, alcohol abuse, PVD, COPD, and asthma who is being seen today for the evaluation of shortness of breath at the request of Dr. Jacqualine Code.  Assessment & Plan   Acute on chronic combined systolic and diastolic heart failure - Reports worsening shortness of breath this morning.  SOB likely multifactorial given AS, underlying COPD, anemia. BNP not terribly elevated but with consideration of lower numbers  associated with obesity. CXR yesterday showed interstitial prominence that may represent mild edema / pulmonary venous congestion. Some LEE noted today at tibia. -Brilinta changed to Plavix given breathing status.  Effient not ideal given patient's anemia and associated bleeding risk with Effient. - Diuresis previously held for bump in creatinine. Continue to hold pending BMET. Ordered stat BMET for this AM to assess renal function and electrolytes. Continue ARB with close monitoring of renal function.  - Continue to monitor I's/O, daily weights. -1.5L yesterday and almost -5L for the admission.  Weight remains around 299-300lbs.  - Recommend ambulate while monitoring O2. Off [place] oxygen.  - On review of EMR, it is documented that patient has history of medication confusion and noncompliance. Likely high risk for readmission. Will need close follow-up after discharge and in person visits given difficulty communicating over the phone in the past noted in previous progress notes.  CAD -  Reporting mild chest pressure this AM, but stated this is chronic and not a new finding for him. Recently underwent LHC as above due to inferior STEMI.  S/p Taxus stent to RCA in 2005 and recently placed DESx2 to [facility] 04/01/2019.   -Troponin 0.03.  EKG without acute changes. As previously documented this admission, findings not consistent with acute in stent thrombosis.  -Continue ASA 81 mg daily, Coreg 12.5 mg twice daily, atorvastatin 40 mg daily, Imdur 30 mg daily, losartan 12.5 mg daily, and sublingual nitro as needed for chest pain.  As above, previously on Brilinta, which we will change to Plavix D/T complaint of worsed breathing.  After initial Plavix load, continue Plavix 75 mg daily.  Given recent stenting, patient will need DAPT for at least 1 year.  AS - Likely contributing to SOB. Most recent echo not able to assess degree of AS. Previous 2018 echo  Showed mild to moderate AS. Continue to follow.  HTN  - Controlled. SBP 120s. Continue medical management for aggressive risk factor modification.   HLD - Continue statin for target LDL less than 70  CKDIII - Continue daily BMET  Anemia - Daily CBC recommended given anemia. Ordered STAT CBC.  - Likely anemia of chronic disease. Current Hgb 10.2. Consider also as contributing to SOB. Recommend transfuse below 8.0.  COPD - Recommend oxygen and nebs as needed, per IM   For questions or updates, please contact [place] Please consult [email or web address] for contact info under        Signed, Arvil Chaco, PA-C  04/14/2019, 11:12 AM

## 2019-04-14 NOTE — Progress Notes (Signed)
Pt complains of 10/10 generalized pain. MD notified. Orders for ultram 50 received. I will continue to assess.

## 2019-04-14 NOTE — TOC Progression Note (Signed)
Transition of Care [facility]) - Progression Note    Patient Details  Name: CHIRSTOPHER IOVINO MRN: [id number] Date of Birth: 18-Feb-1935  Transition of Care [facility]) CM/SW Contact  Elza Rafter, RN Phone Number: 04/14/2019, 2:57 PM  Clinical Narrative:   [place] and [facility] have offered beds.  Notified patient and he is refusing both.  States he just wants to go back home.  This RNCM voiced concern about going back home alone and his current difficulty ambulating.  He states he is just fine and he can use his walker.  Dr. Fritzi Mandes aware and states he can discharge home tomorrow with home health.      Expected Discharge Plan: [place] Barriers to Discharge: Continued Medical Work up, Ship broker, SNF Pending bed offer  Expected Discharge Plan and Services Expected Discharge Plan: [place] arrangements for the past 2 months: Single Family Home                                       Social Determinants of Health (SDOH) Interventions    Readmission Risk Interventions No flowsheet data found.

## 2019-04-14 NOTE — TOC Progression Note (Signed)
Transition of Care [facility]) - Progression Note    Patient Details  Name: [name] MRN: [id number] Date of Birth: [birth year]  [facility] of [facility]) CM/SW Contact  Elza Rafter, RN Phone Number: 04/14/2019, 4:21 PM  Clinical Narrative:   Damaris Schooner with [employer]; daughter.  Notified her of patients refusal to go to SNF.  She was not surprised.  She or her sister will be able to transport to home tomorrow at discharge.  He resides at [facility].      Expected Discharge Plan: Skilled Nursing Facility Barriers to Discharge: Continued Medical Work up  Expected Discharge Plan and Services Expected Discharge Plan: [place] arrangements for the past 2 months: Single Family Home                           HH Arranged: RN, PT, OT [facility] Agency: [facility] Date [facility] Agency Contacted: 04/14/19 Time HH Agency Contacted: 1500 Representative spoke with at [place]: Cassie   Social Determinants of Health ([place]) Interventions    Readmission Risk Interventions No flowsheet data found.

## 2019-04-14 NOTE — Progress Notes (Signed)
[place] [facility] [place] NAME: [name]    MR#:  [id number]  DATE OF BIRTH:  21-Oct-1935  SUBJECTIVE:  CHIEF COMPLAINT: Resting comfortably reports exertional dyspnea although not using oxygen and sats are 99% on RA  REVIEW OF SYSTEMS:  CONSTITUTIONAL: No fever, fatigue or weakness.  EYES: No blurred or double vision.  EARS, NOSE, AND THROAT: No tinnitus or ear pain.  RESPIRATORY: No cough, reports exertional shortness of breath, denies wheezing or hemoptysis.  CARDIOVASCULAR: No chest pain, orthopnea, edema.  GASTROINTESTINAL: No nausea, vomiting, diarrhea or abdominal pain.  GENITOURINARY: No dysuria, hematuria.  ENDOCRINE: No polyuria, nocturia,  HEMATOLOGY: No anemia, easy bruising or bleeding SKIN: No rash or lesion. MUSCULOSKELETAL: No joint pain or arthritis.   NEUROLOGIC: No tingling, numbness, weakness.  PSYCHIATRY: No anxiety or depression.   DRUG ALLERGIES:   Allergies  Allergen Reactions  . Amlodipine Other (See Comments)    Constipation  Constipation Constipation  . Benazepril-Hydrochlorothiazide Other (See Comments)    Constipation  Constipation Constipation  . Chlorthalidone Other (See Comments)    Hyponatremia Hyponatremia  . Lisinopril Other (See Comments)  . Other Other (See Comments)    "ANY BLOOD PRESSURE MEDICATIONS THAT I'VE TRIED" - PT. DOES NOT REMEMBER WHICH ONES "ANY BLOOD PRESSURE MEDICATIONS THAT I'VE TRIED"- PT. DOES NOT REMEMBER WHICH ONES- CONSTIPATION    VITALS:  Blood pressure 126/64, pulse 66, temperature 98 F (36.7 C), temperature source Oral, resp. rate 20, height 5\' 7"  (1.702 m), weight 135.8 kg, SpO2 99 %.  PHYSICAL EXAMINATION:  GENERAL:  84 y.o.-year-old patient lying in the bed with no acute distress. obese EYES: Pupils equal, round, reactive to light and accommodation. No scleral icterus. Extraocular muscles intact.  HEENT: Head atraumatic, normocephalic. Oropharynx and  nasopharynx clear.  NECK:  Supple, no jugular venous distention. No thyroid enlargement, no tenderness.  LUNGS: Moderately diminished breath sounds bilaterally, no wheezing, rales,rhonchi or crepitation. No use of accessory muscles of respiration.  CARDIOVASCULAR: S1, S2 normal. No murmurs, rubs, or gallops.  ABDOMEN: Soft, nontender, nondistended. Bowel sounds present.  EXTREMITIES: Positive pedal edema, no cyanosis, or clubbing.  NEUROLOGIC: Sensation intact. Gait not checked.  PSYCHIATRIC: The patient is alert and oriented x 3.  SKIN: No obvious rash, lesion, or ulcer.    LABORATORY PANEL:   CBC Recent Labs  Lab 04/14/19 1213  WBC 6.1  HGB 10.0*  HCT 30.3*  PLT 252   ------------------------------------------------------------------------------------------------------------------  Chemistries  Recent Labs  Lab 04/14/19 1213  NA 136  K 4.3  CL 102  CO2 24  GLUCOSE 109*  BUN 33*  CREATININE 2.49*  CALCIUM 8.4*   ------------------------------------------------------------------------------------------------------------------  Cardiac Enzymes Recent Labs  Lab 04/12/19 1047  TROPONINI 0.03*   ------------------------------------------------------------------------------------------------------------------  RADIOLOGY:  Dg Chest 1 View  Result Date: 04/13/2019 CLINICAL DATA:  Acute respiratory failure EXAM: CHEST  1 VIEW COMPARISON:  April 12, 2019 FINDINGS: Stable cardiomegaly. The hila and mediastinum are unremarkable. No focal infiltrate. Interstitial prominence without overt edema. IMPRESSION: Interstitial prominence may represent pulmonary venous congestion/mild edema. No focal infiltrate. No other change. Electronically Signed   By: Dorise Bullion III M.D   On: 04/13/2019 06:56    EKG:   Orders placed or performed during the hospital encounter of 04/12/19  . ED EKG  . ED EKG  . EKG    ASSESSMENT AND PLAN:    Acute hypoxic respiratory failure-likely  secondary to acute on chronic combined systolic and diastolic heart  failure.  Patient with recent STEMI 10 days ago. No wheezing to suggest COPD exacerbation. Requiring 2 L O2 in the ED. -COVID testing negative -Received Lasix 80 mg IV x1 in the ED, will continue Lasix 40 mg IV twice daily--now changed to oral dosing -Cardiology consult dr Ross/Dr Fletcher Anon -sats RA 99% - procalcitonin normal -Cardiac monitoring -Wean O2 as able  CAD with recent inferior STEMI 4/14 s/p PCI- no active chest pain. -Continue home aspirin, Coreg, Imdur, losartan -on po  plavix now  Hypertension- normotensive in the ED -Continue home norvasc, losartan, coreg  CKD stage III-stable.  Creatinine at baseline. -Avoid nephrotoxic agents -Monitor creatinine closely in the setting of IV diuresis  COPD-stable, no signs of acute exacerbation. -Continue home inhalers -DuoNebs PRN  Hyperlipidemia-stable -Continue home Lipitor  pT recommending skilled nursing facility-- patient does not want to go to [facility] or [facility]. Discussed with patient he is pretty much at baseline and ready for discharge. He says he will go home. I'll keep in one more day discharge home with home health PT tomorrow. Patient agreeable with plan  All the records are reviewed and case discussed with Care Management/Social Workerr. Management plans discussed with the patient, he is in agreement.  CODE STATUS: fc   TOTAL TIME TAKING CARE OF THIS PATIENT: 36  minutes.   POSSIBLE D/C IN 1 DAYS, DEPENDING ON CLINICAL CONDITION.  Note: This dictation was prepared with Dragon dictation along with smaller phrase technology. Any transcriptional errors that result from this process are unintentional.   Fritzi Mandes M.D on 04/14/2019 at 3:10 PM  Between 7am to 6pm - Pager - [phone] After 6pm go to [email or web address] - password [email or web address] [facility]  [place] [facility]  Office  [phone]  CC: Primary care physician; Marguerita Merles, MD

## 2019-04-15 ENCOUNTER — Telehealth: Payer: Medicare HMO | Admitting: Family

## 2019-04-15 ENCOUNTER — Ambulatory Visit: Payer: Medicare HMO | Admitting: Cardiovascular Disease

## 2019-04-15 DIAGNOSIS — R0602 Shortness of breath: Secondary | ICD-10-CM

## 2019-04-15 MED ORDER — AMLODIPINE BESYLATE 5 MG PO TABS: 2.5000 mg | ORAL_TABLET | Freq: Every day | ORAL | 1 refills | Status: DC

## 2019-04-15 MED ORDER — CLOPIDOGREL BISULFATE 75 MG PO TABS: 75.0000 mg | ORAL_TABLET | Freq: Every day | ORAL | 2 refills | Status: DC

## 2019-04-15 MED ORDER — FUROSEMIDE 40 MG PO TABS: 40.0000 mg | ORAL_TABLET | Freq: Every day | ORAL | 1 refills | Status: DC

## 2019-04-15 MED ORDER — MOMETASONE FURO-FORMOTEROL FUM 200-5 MCG/ACT IN AERO: 2.0000 | INHALATION_SPRAY | Freq: Two times a day (BID) | RESPIRATORY_TRACT | 0 refills | Status: DC

## 2019-04-15 NOTE — TOC Transition Note (Signed)
Transition of Care [facility]) - CM/SW Discharge Note   Patient Details  Name: [name] MRN: [id number] Date of Birth: 02/01/1935  Transition of Care [facility]) CM/SW Contact:  Elza Rafter, RN Phone Number: 04/15/2019, 10:03 AM   Clinical Narrative:   Notified Cassie yesterday that patient will discharge today to home and will resume home health RN, PT and OT.  Spoke with [name] daughter [phone] last evening as well and she states she or her sister can transport to home.  No further needs identified by case management.      Final next level of care: [place] Barriers to Discharge: No Barriers Identified   Patient Goals and CMS Choice Patient states their goals for this hospitalization and ongoing recovery are:: I just want to go home.  I don't want to go to one of those places CMS Medicare.gov Compare Post Acute Care list provided to:: Patient Choice offered to / list presented to : Patient  Discharge Placement                       Discharge Plan and Services                          HH Arranged: RN, PT, OT [facility] Agency: [facility] Date [facility] Agency Contacted: 04/14/19 Time HH Agency Contacted: 1500 Representative spoke with at [place]: Cassie  Social Determinants of Health ([place]) Interventions     Readmission Risk Interventions No flowsheet data found.

## 2019-04-15 NOTE — Progress Notes (Signed)
Progress Note  Patient Name: [name] Date of Encounter: 04/15/2019  Primary Cardiologist: Kathlyn Sacramento, MD   Subjective   He reported significant improvement in his shortness of breath today.  He stated that he believes this improvement in his breathing is due to the transition from Brilinta to Plavix yesterday.  He feels as if his breathing is back to baseline.  No chest pain.  He is no longer tender to palpation in his right upper quadrant.  Inpatient Medications    Scheduled Meds: . amLODipine  2.5 mg Oral Daily  . aspirin EC  81 mg Oral Daily  . atorvastatin  40 mg Oral Daily  . carvedilol  12.5 mg Oral BID WC  . clopidogrel  75 mg Oral Daily  . enoxaparin (LOVENOX) injection  40 mg Subcutaneous Q24H  . furosemide  40 mg Oral Daily  . isosorbide mononitrate  30 mg Oral Daily  . losartan  12.5 mg Oral Daily  . mometasone-formoterol  2 puff Inhalation BID  . sodium chloride flush  3 mL Intravenous Q12H   Continuous Infusions:  PRN Meds: acetaminophen **OR** acetaminophen, ipratropium-albuterol, nitroGLYCERIN, ondansetron **OR** ondansetron (ZOFRAN) IV, polyethylene glycol, sodium chloride flush, traMADol   Vital Signs    Vitals:   04/14/19 1633 04/14/19 1937 04/15/19 0438 04/15/19 0745  BP: (!) 121/38 (!) 104/37 (!) 89/47 (!) 162/63  Pulse: 64 61 77 65  Resp:  18 19 18   Temp: 98.1 F (36.7 C) 98.3 F (36.8 C) 98.6 F (37 C) 97.8 F (36.6 C)  TempSrc: Oral Oral  Oral  SpO2: 98% 98% 97% 95%  Weight:   134.6 kg   Height:        Intake/Output Summary (Last 24 hours) at 04/15/2019 1138 Last data filed at 04/15/2019 0718 Gross per 24 hour  Intake 240 ml  Output 345 ml  Net -105 ml   Filed Weights   04/13/19 0429 04/14/19 0426 04/15/19 0438  Weight: 135.3 kg 135.8 kg 134.6 kg    Telemetry    SR - Personally Reviewed  ECG    No new tracings- Personally Reviewed  Physical Exam   GEN: No acute distress.   Neck:  JVD difficult to assess due to  body habitus Cardiac:  Distant heart sounds, RRR, no murmurs, rubs, or gallops.  Respiratory:  Bilateral wheezing.   GI: Obese, not tender to palpation. MS:  No lower extremity edema; No deformity. Neuro:  Nonfocal  Psych: Normal affect   Labs    Chemistry Recent Labs  Lab 04/12/19 1047 04/13/19 0510 04/14/19 1213  NA 133* 134* 136  K 4.3 4.0 4.3  CL 104 105 102  CO2 20* 21* 24  GLUCOSE 107* 96 109*  BUN 29* 28* 33*  CREATININE 2.03* 2.27* 2.49*  CALCIUM 8.0* 8.0* 8.4*  GFRNONAA 30* 26* 23*  GFRAA 34* 30* 27*  ANIONGAP 9 8 10      Hematology Recent Labs  Lab 04/12/19 1047 04/13/19 0510 04/14/19 1213  WBC 6.5 5.7 6.1  RBC 3.50* 3.76* 3.66*  HGB 9.5* 10.2* 10.0*  HCT 29.0* 31.6* 30.3*  MCV 82.9 84.0 82.8  MCH 27.1 27.1 27.3  MCHC 32.8 32.3 33.0  RDW 14.6 14.8 14.8  PLT 246 232 252    Cardiac Enzymes Recent Labs  Lab 04/12/19 1047  TROPONINI 0.03*   No results for input(s): TROPIPOC in the last 168 hours.   BNP Recent Labs  Lab 04/12/19 1048  BNP 182.0*  DDimer No results for input(s): DDIMER in the last 168 hours.   Radiology    No results found.  Cardiac Studies   04/01/19 LHC Conclusions: 1. Severe single-vessel coronary artery disease with sequential 80-95% proximal and mid RCA stenoses with heavy thrombus causing inferior STEMI. 2. Mild, non-obstructive coronary artery disease involving the left coronary artery. 3. Patent distal RCA Taxus stent with mild in-stent restenosis. Challenging but ultimately successful PCI to the proximal and mid RCA lesions with non-overlapping Resolute Onyx 4.0 x 26 mm (proximal - dilated to 4.4 mm) and 4.0 x 15 mm (distal - dilated to 4.2 mm) drug-eluting stents with 0% residual stenosis and TIMI-3 flow.  04/01/19 TTE  1. Severe hypokinesis of the left ventricular, entire inferior wall and inferoseptal wall.  2. The left ventricle has mildly reduced systolic function, with an ejection fraction of 45-50%.  The cavity size was normal. There is severely increased left ventricular wall thickness. Left ventricular diastolic function could not be evaluated due to  nondiagnostic images.  3. Left atrial size was not well visualized.  4. The mitral valve was not well visualized. There is mild mitral annular calcification present.  5. The aortic valve is tricuspid. Mild thickening of the aortic valve. Mild calcification of the aortic valve. Aortic valve regurgitation was not assessed by color flow Doppler.  6. Degree of aortic stenosis could not be assessed due to lack of apical windows for pulse wave Doppler of the LVOT/AoV. However, it does not appear to be severe.  7. Moderately dilated pulmonary artery.  8. The interatrial septum was not well visualized.  07/31/2017 TTE Procedure narrative: Transthoracic echocardiography. The study   was technically difficult. - Left ventricle: The cavity size was at the upper limits of   normal. Wall thickness was increased in a pattern of moderate   LVH. Systolic function was mildly to moderately reduced. The   estimated ejection fraction was in the range of 40% to 45%.   Doppler parameters are consistent with abnormal left ventricular   relaxation (grade 1 diastolic dysfunction). Doppler parameters   are consistent with high ventricular filling pressure. - Aortic valve: Trileaflet; mildly thickened, mildly calcified   leaflets. Valve mobility was mildly restricted. Transvalvular   velocity was minimally increased. There was mild to moderate   stenosis. Valve area (VTI): 1.67 cm^2. Valve area (Vmax): 1.25   cm^2. Valve area (Vmean): 1.29 cm^2. - Left atrium: The atrium was mildly dilated. - Right ventricle: The cavity size was normal. Systolic function   was normal. - Right atrium: The atrium was mildly dilated.   Patient Profile     84 y.o. male with history of CAD (s/p TAXUS stent to RCA 2005), inferior STEMI (s/p 04/01/19 DES x2 RCA), systolic/diastolic  CHF (EF 12-45%), moderate AS (per 2018 echo), CKD III, HLD, HTN, tobacco abuse, alcohol abuse, PVD, COPD, and asthma who is being seen today for the evaluation of shortness of breath.  Assessment & Plan   Acute on chronic combined systolic and diastolic heart failure - Improved SOB and volume status. Brilinta changed to Plavix given breathing status, which patient feels has helped his breathing.  Stated he is back to baseline and appears relatively euvolemic on exam. - Transitioned to oral diuresis yesterday. Continue to monitor I's/O, daily weights. Almost -5L for the admission.  Weight decreased from 4/27. - Recommend ambulate while monitoring O2 saturation and vitals as below. Remains off Frannie oxygen.  - Will need very close monitoring of renal function  at follow-up given bump in Cr and restarted home diuretic to prevent recurrence of volume overload.  Continue lasix 40mg  once daily with losartan and close monitoring of renal function and electrolytes. Recommend follow-up virtual or office visit and labs within approximately 1 week.  CAD - No CP. Recently underwent LHC as above due to inferior STEMI.  S/p Taxus stent to RCA in 2005 and recently placed DESx2 to [facility] 04/01/2019. Troponin 0.03.  EKG without acute changes.   -Continue ASA 81 mg daily, Coreg 12.5 mg twice daily, atorvastatin 40 mg daily, Imdur 30 mg daily, losartan 12.5 mg daily, and sublingual nitro as needed for chest pain.  Continue newly started Plavix 90mg  bid as above.  Given recent stenting, patient will need DAPT for at least 1 year.  AS - Consider as contributing to SOB. Most recent echo not able to assess degree of AS. Previous 2018 echo  Showed mild to moderate AS. Continue to follow.  HTN  - Labile today. SBP 89-162. Recommend recheck BP. Continue medical management for aggressive risk factor modification and titrate as needed if continuously elevated BP.  HLD - Continue statin for target LDL less than 70   For  questions or updates, please contact [place] Please consult [email or web address] for contact info under        Signed, Arvil Chaco, PA-C  04/15/2019, 11:38 AM

## 2019-04-15 NOTE — Discharge Summary (Addendum)
[facility] NAME: [name]    MR#:  [id number]  DATE OF BIRTH:  06-Oct-1935  DATE OF ADMISSION:  04/12/2019 ADMITTING PHYSICIAN: Sela Hua, MD  DATE OF DISCHARGE: 04/15/2019  PRIMARY CARE PHYSICIAN: Marguerita Merles, MD    ADMISSION DIAGNOSIS:  Shortness of breath [R06.02] Volume overload state of heart [E87.79]  DISCHARGE DIAGNOSIS:  Acute systolic CHF Recent NSTEMI with stent placement Ischemic CMF EF 40-50% SECONDARY DIAGNOSIS:   Past Medical History:  Diagnosis Date  . Alcoholism ([place])   . Asthma   . Bipolar affective disorder ([place])   . Chronic combined systolic and diastolic CHF (congestive heart failure) ([place])    a. 08/2016 Echo: EF 40-45%, mild AS, mild to mod MR, mildly dil LA/RA, mild-mod TR; b. 07/2017 Echo: EF 40-45%, mod LVH, Gr1 DD, mild to mod AS, mildly dil LA, nl RV fxn; c. 03/2019 Echo: EF 45-50%, AS (not severe). Mod dil PA.  . CKD (chronic kidney disease), stage III ([place])   . COPD (chronic obstructive pulmonary disease) ([place])   . Coronary artery disease    a. 01/2004 s/p PCI and Taxus DES to dRCA (3.5 x 12 mm); b. 07/2017 Lexiscan MV: no ischemia. Sm area of apicl thinning, likely attenuation. EF 33% (GI uptake noted)-->Low risk; c. 03/2019 Inf STEMI/PCI: LM nl, LAD min irregs, D1 20ost, RI 20ost, LCX nl, RCA 90p/27m (4.0x26 Resolute Onyx DES), 33m (4.0x15 Resolute Onyx DES), 10d ISR.  Marland Kitchen Degenerative joint disease   . Essential hypertension   . Hyperlipidemia   . Hypertension   . Ischemic cardiomyopathy    a. 08/2016 Echo: EF 40-45%; b. 07/2017 Echo: EF 40-45%; c. 03/2019 Echo: EF 45-50%.  . Prostate CA ([place])    prostate ca dx 20 yrs ago  . PVD (peripheral vascular disease) ([place])   . Tobacco abuse     HOSPITAL COURSE:  [name]  is a 84 y.o. male with a known history of recent STEMI 4/14, chronic combined systolic and diastolic CHF, hypertension, hyperlipidemia, CKD 3, COPD, bipolar disorder who  presented to the ED with shortness of breath for the last couple of days  *Acute hypoxic respiratory failure-likely secondary to acute on chronic combined systolic and diastolic heart failure. Patient with recent STEMI 10 days ago. No wheezing to suggest COPD exacerbation.Requiring 2 L O2 in the ED. -COVID testing negative -Received Lasix 80 mg IV x1 in the ED,will continue Lasix 40 mg IV twice daily--now changed to oral dosing -Cardiology consult dr Ross/Dr Fletcher Anon -sats RA 99% - procalcitonin normal -weaned off oxygen  *CAD with recentinferiorSTEMI 4/14s/p PCI-no active chest pain. -Continue home aspirin, Coreg, Imdur, losartan -on po  plavix now. D/ced Brilinta  *Hypertension-normotensive in the ED -Continue homenorvasc,  Coreg, imdur -d/ced losartan --cardiology recommendation  *CKD stage III-stable. Creatinine at baseline. -Avoid nephrotoxic agents -Monitor creatinine closely in the setting of IV diuresis  *COPD-stable, no signs of acute exacerbation. -Continue home inhalers -DuoNebs PRN  *Hyperlipidemia-stable -Continue home Lipitor  *PT recommending skilled nursing facility-- patient does not want to go to [facility] or [facility]. Discussed with patient he is pretty much at baseline and ready for discharge.  Will d/c home with HHPT. dter aware  CONSULTS OBTAINED:  Treatment Team:  [name], MD  DRUG ALLERGIES:   Allergies  Allergen Reactions  . Amlodipine Other (See Comments)    Constipation  Constipation Constipation  . Benazepril-Hydrochlorothiazide Other (See Comments)  Constipation  Constipation Constipation  . Chlorthalidone Other (See Comments)    Hyponatremia Hyponatremia  . Lisinopril Other (See Comments)  . Other Other (See Comments)    "ANY BLOOD PRESSURE MEDICATIONS THAT I'VE TRIED" - PT. DOES NOT REMEMBER WHICH ONES "ANY BLOOD PRESSURE MEDICATIONS THAT I'VE TRIED"- PT. DOES NOT REMEMBER WHICH ONES-  CONSTIPATION    DISCHARGE MEDICATIONS:   Allergies as of 04/15/2019      Reactions   Amlodipine Other (See Comments)   Constipation Constipation Constipation   Benazepril-hydrochlorothiazide Other (See Comments)   Constipation Constipation Constipation   Chlorthalidone Other (See Comments)   Hyponatremia Hyponatremia   Lisinopril Other (See Comments)   Other Other (See Comments)   "ANY BLOOD PRESSURE MEDICATIONS THAT I'VE TRIED" - PT. DOES NOT REMEMBER WHICH ONES "ANY BLOOD PRESSURE MEDICATIONS THAT I'VE TRIED"- PT. DOES NOT REMEMBER WHICH ONES- CONSTIPATION      Medication List    STOP taking these medications   fluticasone-salmeterol 115-21 MCG/ACT inhaler Commonly known as:  ADVAIR HFA Replaced by:  mometasone-formoterol 200-5 MCG/ACT Aero   losartan 25 MG tablet Commonly known as:  COZAAR   ticagrelor 90 MG Tabs tablet Commonly known as:  BRILINTA     TAKE these medications   albuterol 108 (90 Base) MCG/ACT inhaler Commonly known as:  ProAir HFA Inhale 2 puffs into the lungs every 6 (six) hours as needed for wheezing or shortness of breath. Reported on 04/21/2016   amLODipine 5 MG tablet Commonly known as:  NORVASC Take 0.5 tablets (2.5 mg total) by mouth daily. What changed:  how much to take   aspirin EC 81 MG tablet Take 1 tablet (81 mg total) by mouth daily.   atorvastatin 40 MG tablet Commonly known as:  LIPITOR Take 1 tablet (40 mg total) by mouth daily.   carvedilol 12.5 MG tablet Commonly known as:  COREG Take 1 tablet (12.5 mg total) by mouth 2 (two) times daily with a meal.   clopidogrel 75 MG tablet Commonly known as:  PLAVIX Take 1 tablet (75 mg total) by mouth daily. Start taking on:  April 16, 2019   furosemide 40 MG tablet Commonly known as:  LASIX Take 1 tablet (40 mg total) by mouth daily. Start taking on:  April 16, 2019   isosorbide mononitrate 30 MG 24 hr tablet Commonly known as:  IMDUR Take 1 tablet (30 mg total) by mouth  daily.   mometasone-formoterol 200-5 MCG/ACT Aero Commonly known as:  DULERA Inhale 2 puffs into the lungs 2 (two) times daily. Replaces:  fluticasone-salmeterol 115-21 MCG/ACT inhaler   Nitrostat 0.4 MG SL tablet Generic drug:  nitroGLYCERIN Take 0.4 mg by mouth every 5 (five) minutes x 3 doses as needed for chest pain. As needed for chest pain       If you experience worsening of your admission symptoms, develop shortness of breath, life threatening emergency, suicidal or homicidal thoughts you must seek medical attention immediately by calling 911 or calling your MD immediately  if symptoms less severe.  You Must read complete instructions/literature along with all the possible adverse reactions/side effects for all the Medicines you take and that have been prescribed to you. Take any new Medicines after you have completely understood and accept all the possible adverse reactions/side effects.   Please note  You were cared for by a hospitalist during your hospital stay. If you have any questions about your discharge medications or the care you received while you were in the hospital after  you are discharged, you can call the unit and asked to speak with the hospitalist on call if the hospitalist that took care of you is not available. Once you are discharged, your primary care physician will handle any further medical issues. Please note that NO REFILLS for any discharge medications will be authorized once you are discharged, as it is imperative that you return to your primary care physician (or establish a relationship with a primary care physician if you do not have one) for your aftercare needs so that they can reassess your need for medications and monitor your lab values. Today   SUBJECTIVE   No new complaints  VITAL SIGNS:  Blood pressure (!) 162/63, pulse 65, temperature 97.8 F (36.6 C), temperature source Oral, resp. rate 18, height 5\' 7"  (1.702 m), weight 134.6 kg, SpO2 95  %.  I/O:    Intake/Output Summary (Last 24 hours) at 04/15/2019 1326 Last data filed at 04/15/2019 1100 Gross per 24 hour  Intake 240 ml  Output 345 ml  Net -105 ml    PHYSICAL EXAMINATION:  GENERAL:  83 y.o.-year-old patient lying in the bed with no acute distress. obese EYES: Pupils equal, round, reactive to light and accommodation. No scleral icterus. Extraocular muscles intact.  HEENT: Head atraumatic, normocephalic. Oropharynx and nasopharynx clear.  NECK:  Supple, no jugular venous distention. No thyroid enlargement, no tenderness.  LUNGS: decreasedl breath sounds bilaterally, no wheezing, rales,rhonchi or crepitation. No use of accessory muscles of respiration.  CARDIOVASCULAR: S1, S2 normal. No murmurs, rubs, or gallops.  ABDOMEN: Soft, non-tender, non-distended. Bowel sounds present. No organomegaly or mass.  EXTREMITIES: No pedal edema, cyanosis, or clubbing.  NEUROLOGIC: Cranial nerves II through XII are intact. Muscle strength 5/5 in all extremities. Sensation intact. Gait not checked.  PSYCHIATRIC: The patient is alert and oriented x 3.  SKIN: No obvious rash, lesion, or ulcer.   DATA REVIEW:   CBC  Recent Labs  Lab 04/14/19 1213  WBC 6.1  HGB 10.0*  HCT 30.3*  PLT 252    Chemistries  Recent Labs  Lab 04/14/19 1213  NA 136  K 4.3  CL 102  CO2 24  GLUCOSE 109*  BUN 33*  CREATININE 2.49*  CALCIUM 8.4*    Microbiology Results   Recent Results (from the past 240 hour(s))  SARS Coronavirus 2 [facility] order, Performed in [place] hospital lab)     Status: None   Collection Time: 04/12/19 11:14 AM  Result Value Ref Range Status   SARS Coronavirus 2 NEGATIVE NEGATIVE Final    Comment: (NOTE) If result is NEGATIVE SARS-CoV-2 target nucleic acids are NOT DETECTED. The SARS-CoV-2 RNA is generally detectable in upper and lower  respiratory specimens during the acute phase of infection. The lowest  concentration of SARS-CoV-2 viral copies this assay  can detect is 250  copies / mL. A negative result does not preclude SARS-CoV-2 infection  and should not be used as the sole basis for treatment or other  patient management decisions.  A negative result may occur with  improper specimen collection / handling, submission of specimen other  than nasopharyngeal swab, presence of viral mutation(s) within the  areas targeted by this assay, and inadequate number of viral copies  (<250 copies / mL). A negative result must be combined with clinical  observations, patient history, and epidemiological information. If result is POSITIVE SARS-CoV-2 target nucleic acids are DETECTED. The SARS-CoV-2 RNA is generally detectable in upper and lower  respiratory specimens dur  ing the acute phase of infection.  Positive  results are indicative of active infection with SARS-CoV-2.  Clinical  correlation with patient history and other diagnostic information is  necessary to determine patient infection status.  Positive results do  not rule out bacterial infection or co-infection with other viruses. If result is PRESUMPTIVE POSTIVE SARS-CoV-2 nucleic acids MAY BE PRESENT.   A presumptive positive result was obtained on the submitted specimen  and confirmed on repeat testing.  While 2019 novel coronavirus  (SARS-CoV-2) nucleic acids may be present in the submitted sample  additional confirmatory testing may be necessary for epidemiological  and / or clinical management purposes  to differentiate between  SARS-CoV-2 and other Sarbecovirus currently known to infect humans.  If clinically indicated additional testing with an alternate test  methodology [phone]) is advised. The SARS-CoV-2 RNA is generally  detectable in upper and lower respiratory sp ecimens during the acute  phase of infection. The expected result is Negative. Fact Sheet for Patients:  StrictlyIdeas.no Fact Sheet for Healthcare  Providers: BankingDealers.co.za This test is not yet approved or cleared by the [place] FDA and has been authorized for detection and/or diagnosis of SARS-CoV-2 by FDA under an Emergency Use Authorization (EUA).  This EUA will remain in effect (meaning this test can be used) for the duration of the COVID-19 declaration under Section 564(b)(1) of the Act, 21 U.S.C. section 360bbb-3(b)(1), unless the authorization is terminated or revoked sooner. Performed at [facility], [address], [place], [place] [address]     RADIOLOGY:  No results found.   CODE STATUS:     Code Status Orders  (From admission, onward)         Start     Ordered   04/12/19 1450  Full code  Continuous     04/12/19 1450        Code Status History    Date Active Date Inactive Code Status Order ID Comments User Context   04/01/2019 0229 04/02/2019 2239 Full Code [id number]  Nelva Bush, MD Inpatient   07/31/2017 1305 08/02/2017 2215 Full Code [id number]  Saundra Shelling, MD ED   05/27/2017 1923 05/31/2017 1412 Full Code [id number]  Baxter Hire, MD Inpatient   12/01/2016 2042 12/03/2016 1605 Full Code [id number]  Vaughan Basta, MD Inpatient   09/12/2016 0842 09/13/2016 2030 Full Code [id number]  Alonna Buckler, RN Inpatient   09/11/2016 0846 09/11/2016 1659 Full Code [id number]  Dustin Flock, MD ED   08/31/2016 0333 09/06/2016 1714 Full Code [id number]  Saundra Shelling, MD Inpatient      TOTAL TIME TAKING CARE OF THIS PATIENT: 40 minutes.    Fritzi Mandes M.D on 04/15/2019 at 1:26 PM  Between 7am to 6pm - Pager - [phone] After 6pm go to [email or web address] - password [email or web address] [facility]  Office  [phone]  CC: Primary care physician; Marguerita Merles, MD

## 2019-04-15 NOTE — Care Management Important Message (Signed)
Important Message  Patient Details  Name: [name] MRN: [id number] Date of Birth: 1935-02-08   Medicare Important Message Given:  Yes    Dannette Barbara 04/15/2019, 10:38 AM

## 2019-04-24 ENCOUNTER — Telehealth: Payer: Self-pay

## 2019-04-24 NOTE — Telephone Encounter (Signed)
   TELEPHONE CALL NOTE  This patient has been deemed a candidate for follow-up tele-health visit to limit community exposure during the Covid-19 pandemic. I spoke with the patient via phone to discuss instructions. The patient was advised to review the section on consent for treatment as well. The patient will receive a phone call 2-3 days prior to their E-Visit at which time consent will be verbally confirmed. A Virtual Office Visit appointment type has been scheduled for 04/25/2019 with [facility].  Vonda Antigua L, CMA 04/24/2019 8:14 AM

## 2019-04-24 NOTE — Telephone Encounter (Signed)
TELEPHONE CALL NOTE  [name] has been deemed a candidate for a follow-up tele-health visit to limit community exposure during the Covid-19 pandemic. I spoke with the patient via phone to ensure availability of phone/video source, confirm preferred email & phone number, discuss instructions and expectations, and review consent.   I reminded [name] to be prepared with any vital sign and/or heart rhythm information that could potentially be obtained via home monitoring, at the time of his visit.  Finally, I reminded [name] to expect an e-mail containing a link for their video-based visit approximately 15 minutes before his visit, or alternatively, a phone call at the time of his visit if his visit is planned to be a phone encounter.  Did the patient verbally consent to treatment as below? YES  Katieann Hungate L, CMA 04/24/2019 8:15 AM  CONSENT FOR TELE-HEALTH VISIT - PLEASE REVIEW  I hereby voluntarily request, consent and authorize [facility] and its employed or contracted physicians, physician assistants, nurse practitioners or other licensed health care professionals (the Practitioner), to provide me with telemedicine health care services (the "Services") as deemed necessary by the treating Practitioner. I acknowledge and consent to receive the Services by the Practitioner via telemedicine. I understand that the telemedicine visit will involve communicating with the Practitioner through telephonic communication technology and the disclosure of certain medical information by electronic transmission. I acknowledge that I have been given the opportunity to request an in-person assessment or other available alternative prior to the telemedicine visit and am voluntarily participating in the telemedicine visit.  I understand that I have the right to withhold or withdraw my consent to the use of telemedicine in the course of my care at any time, without affecting my right to  future care or treatment, and that the Practitioner or I may terminate the telemedicine visit at any time. I understand that I have the right to inspect all information obtained and/or recorded in the course of the telemedicine visit and may receive copies of available information for a reasonable fee.  I understand that some of the potential risks of receiving the Services via telemedicine include:  Marland Kitchen Delay or interruption in medical evaluation due to technological equipment failure or disruption; . Information transmitted may not be sufficient (e.g. poor resolution of images) to allow for appropriate medical decision making by the Practitioner; and/or  . In rare instances, security protocols could fail, causing a breach of personal health information.  Furthermore, I acknowledge that it is my responsibility to provide information about my medical history, conditions and care that is complete and accurate to the best of my ability. I acknowledge that Practitioner's advice, recommendations, and/or decision may be based on factors not within their control, such as incomplete or inaccurate data provided by me or lack of visual representation. I understand that the practice of medicine is not an exact science and that Practitioner makes no warranties or guarantees regarding treatment outcomes. I acknowledge that I will receive a copy of this consent concurrently upon execution via email to the email address I last provided but may also request a printed copy by calling the office of [facility].    I understand that my insurance may be billed for this visit.   I have read or had this consent read to me. . I understand the contents of this consent, which adequately explains the benefits and risks of the Services being provided via telemedicine.  Marland Kitchen  I have been provided ample opportunity to ask questions regarding this consent and the Services and have had my questions answered to my  satisfaction. . I give my informed consent for the services to be provided through the use of telemedicine in my medical care  By participating in this telemedicine visit I agree to the above.

## 2019-04-25 ENCOUNTER — Ambulatory Visit: Payer: Medicare HMO | Attending: Family | Admitting: Family

## 2019-04-25 ENCOUNTER — Encounter: Payer: Self-pay | Admitting: Family

## 2019-04-25 ENCOUNTER — Other Ambulatory Visit: Payer: Self-pay

## 2019-04-25 VITALS — HR 66 | Wt 293.0 lb

## 2019-04-25 DIAGNOSIS — I5042 Chronic combined systolic (congestive) and diastolic (congestive) heart failure: Secondary | ICD-10-CM

## 2019-04-25 DIAGNOSIS — I10 Essential (primary) hypertension: Secondary | ICD-10-CM

## 2019-04-25 NOTE — Patient Instructions (Signed)
Continue weighing daily and call for an overnight weight gain of > 2 pounds or a weekly weight gain of >5 pounds. 

## 2019-04-25 NOTE — Progress Notes (Signed)
Virtual Visit via Telephone Note   Evaluation Performed:  Initial visit  This visit type was conducted due to national recommendations for restrictions regarding the COVID-19 Pandemic (e.g. social distancing).  This format is felt to be most appropriate for this patient at this time.  All issues noted in this document were discussed and addressed.  No physical exam was performed (except for noted visual exam findings with Video Visits).  Please refer to the patient's chart (MyChart message for video visits and phone note for telephone visits) for the patient's consent to telehealth for [facility]  Date:  04/25/2019   ID:  [name], DOB 02/18/1935, MRN [id number]  Patient Location: [place] [address]   Provider location:   [facility] [address], [place] [address]  PCP:  Marguerita Merles, MD  Cardiologist:  Kathlyn Sacramento, MD  Electrophysiologist:  None   Chief Complaint:  Shortness of breath  History of Present Illness:    [name] is a 84 y.o. male who presents via audio/video conferencing for a telehealth visit today.  Patient verified DOB and address.  The patient does not have symptoms concerning for COVID-19 infection (fever, chills, cough, or new SHORTNESS OF BREATH).   Patient reports minimal shortness of breath upon moderate exertion. He describes this as chronic in nature having been present for several years. He has associated morning dizziness, head congestion and chronic difficulty sleeping along with this. He denies any swelling in his legs/ abdomen, palpitations, chest pain, cough, fatigue or weight gain.   Prior CV studies:   The following studies were reviewed today:  Echo report from 04/01/2019 reviewed and showed an EF of 45-50%.  Catheterization done 04/01/2019 with PCI to the proximal and mid RCA lesions.  Past Medical History:  Diagnosis Date  . Alcoholism ([place])   . Asthma   .  Bipolar affective disorder ([place])   . Chronic combined systolic and diastolic CHF (congestive heart failure) ([place])    a. 08/2016 Echo: EF 40-45%, mild AS, mild to mod MR, mildly dil LA/RA, mild-mod TR; b. 07/2017 Echo: EF 40-45%, mod LVH, Gr1 DD, mild to mod AS, mildly dil LA, nl RV fxn; c. 03/2019 Echo: EF 45-50%, AS (not severe). Mod dil PA.  . CKD (chronic kidney disease), stage III ([place])   . COPD (chronic obstructive pulmonary disease) ([place])   . Coronary artery disease    a. 01/2004 s/p PCI and Taxus DES to dRCA (3.5 x 12 mm); b. 07/2017 Lexiscan MV: no ischemia. Sm area of apicl thinning, likely attenuation. EF 33% (GI uptake noted)-->Low risk; c. 03/2019 Inf STEMI/PCI: LM nl, LAD min irregs, D1 20ost, RI 20ost, LCX nl, RCA 90p/17m (4.0x26 Resolute Onyx DES), 56m (4.0x15 Resolute Onyx DES), 10d ISR.  Marland Kitchen Degenerative joint disease   . Essential hypertension   . Hyperlipidemia   . Hypertension   . Ischemic cardiomyopathy    a. 08/2016 Echo: EF 40-45%; b. 07/2017 Echo: EF 40-45%; c. 03/2019 Echo: EF 45-50%.  . Prostate CA ([place])    prostate ca dx 20 yrs ago  . PVD (peripheral vascular disease) ([place])   . Tobacco abuse    Past Surgical History:  Procedure Laterality Date  . CARDIAC CATHETERIZATION    . CORONARY ANGIOGRAPHY N/A 04/01/2019   Procedure: CORONARY ANGIOGRAPHY;  Surgeon: Nelva Bush, MD;  Location: [place] [facility];  Service: Cardiovascular;  Laterality: N/A;  . CORONARY  ANGIOPLASTY WITH STENT PLACEMENT    . CORONARY/GRAFT ACUTE MI REVASCULARIZATION N/A 04/01/2019   Procedure: Coronary/Graft Acute MI Revascularization;  Surgeon: Nelva Bush, MD;  Location: [place] [facility];  Service: Cardiovascular;  Laterality: N/A;  . TOTAL HIP ARTHROPLASTY     right     Current Meds  Medication Sig  . albuterol (VENTOLIN HFA) 108 (90 Base) MCG/ACT inhaler Inhale 2 puffs into the lungs every 6 (six) hours as needed for wheezing or shortness of breath.  Marland Kitchen amLODipine (NORVASC) 5 MG  tablet Take 0.5 tablets (2.5 mg total) by mouth daily.  Marland Kitchen aspirin EC 81 MG tablet Take 1 tablet (81 mg total) by mouth daily.  Marland Kitchen atorvastatin (LIPITOR) 40 MG tablet Take 1 tablet (40 mg total) by mouth daily.  . carvedilol (COREG) 12.5 MG tablet Take 1 tablet (12.5 mg total) by mouth 2 (two) times daily with a meal.  . clopidogrel (PLAVIX) 75 MG tablet Take 1 tablet (75 mg total) by mouth daily.  . furosemide (LASIX) 40 MG tablet Take 1 tablet (40 mg total) by mouth daily. (Patient taking differently: Take 40 mg by mouth 2 (two) times daily. )  . isosorbide mononitrate (IMDUR) 30 MG 24 hr tablet Take 1 tablet (30 mg total) by mouth daily.  . mometasone-formoterol (DULERA) 200-5 MCG/ACT AERO Inhale 2 puffs into the lungs 2 (two) times daily.  . nitroGLYCERIN (NITROSTAT) 0.4 MG SL tablet Take 0.4 mg by mouth every 5 (five) minutes x 3 doses as needed for chest pain. As needed for chest pain     Allergies:   Amlodipine; Benazepril-hydrochlorothiazide; Chlorthalidone; Lisinopril; and Other   Social History   Tobacco Use  . Smoking status: Former Smoker    Packs/day: 1.00    Years: 50.00    Pack years: 50.00    Types: Cigarettes  . Smokeless tobacco: Never Used  Substance Use Topics  . Alcohol use: Yes    Comment: social   . Drug use: No     Family Hx: The patient's family history includes Heart attack in his father and mother; Hyperlipidemia in his father and mother; Hypertension in his father and mother. There is no history of Prostate cancer, Bladder Cancer, or Kidney cancer.  ROS:   Please see the history of present illness.     All other systems reviewed and are negative.   Labs/Other Tests and Data Reviewed:    Recent Labs: 04/02/2019: ALT 15 04/12/2019: B Natriuretic Peptide 182.0 04/14/2019: BUN 33; Creatinine, Ser 2.49; Hemoglobin 10.0; Platelets 252; Potassium 4.3; Sodium 136   Recent Lipid Panel Lab Results  Component Value Date/Time   CHOL 104 04/01/2019 04:31 AM    CHOL 189 04/26/2016 08:10 AM   CHOL 138 05/31/2014 04:16 AM   TRIG 30 04/01/2019 04:31 AM   TRIG 75 05/31/2014 04:16 AM   HDL 43 04/01/2019 04:31 AM   HDL 66 04/26/2016 08:10 AM   HDL 40 05/31/2014 04:16 AM   CHOLHDL 2.4 04/01/2019 04:31 AM   LDLCALC 55 04/01/2019 04:31 AM   LDLCALC 97 04/26/2016 08:10 AM   LDLCALC 83 05/31/2014 04:16 AM    Wt Readings from Last 3 Encounters:  04/25/19 293 lb (132.9 kg)  04/15/19 296 lb 11.8 oz (134.6 kg)  04/10/19 (!) 304 lb (137.9 kg)     Exam:    Vital Signs:  Pulse 66 Comment: self-reported  Wt 293 lb (132.9 kg) Comment: self-reported  SpO2 96% Comment: self-reported  BMI 45.89 kg/m  Well nourished, well developed male in no  acute distress.   ASSESSMENT & PLAN:    1. Chronic heart failure with minimally reduced ejection fraction- - NYHA class II - euvolemic today based on patient's description of symptoms - weighing daily and says that his weight has fluctuated between 291-293 pounds; currently taking his furosemide BID - not adding salt and is trying to eat low sodium foods - EF >40% so would not qualify for entresto - BNP 04/12/2019 was 182.0  2: HTN-   - not checking his BP at home - had telemedicine visit with PCP [name]) last week - BMP 04/14/2019 reviewed and showed sodium 136, potassium 4.3. creatinine 2.49 and GFR 27   COVID-19 Education: The signs and symptoms of COVID-19 were discussed with the patient and how to seek care for testing (follow up with PCP or arrange E-visit).  The importance of social distancing was discussed today.  Patient Risk:   After full review of this patients clinical status, I feel that they are at least moderate risk at this time.  Time:   Today, I have spent 9 minutes with the patient with telehealth technology discussing medications, weight and symptoms to report.     Medication Adjustments/Labs and Tests Ordered: Current medicines are reviewed at length with the patient today.   Concerns regarding medicines are outlined above.   Tests Ordered: No orders of the defined types were placed in this encounter.  Medication Changes: No orders of the defined types were placed in this encounter.   Disposition:  Follow-up in 6 weeks or sooner for any questions/problems before then.   Signed, Alisa Graff, FNP  04/25/2019 11:55 AM    [facility]

## 2019-04-28 ENCOUNTER — Telehealth: Payer: Self-pay | Admitting: Cardiovascular Disease

## 2019-04-28 NOTE — Telephone Encounter (Signed)
Patient daughter called to cancel in office appointment with Dr Fletcher Anon Please advise on where to r/s

## 2019-04-29 ENCOUNTER — Ambulatory Visit: Payer: Medicare HMO | Admitting: Cardiovascular Disease

## 2019-06-03 ENCOUNTER — Telehealth: Payer: Self-pay

## 2019-06-03 NOTE — Telephone Encounter (Signed)
   TELEPHONE CALL NOTE  This patient has been deemed a candidate for follow-up tele-health visit to limit community exposure during the Covid-19 pandemic. I spoke with the patient via phone to discuss instructions. The patient was advised to review the section on consent for treatment as well. The patient will receive a phone call 2-3 days prior to their E-Visit at which time consent will be verbally confirmed. A Virtual Office Visit appointment type has been scheduled for 06/04/2019 with Darylene Price FNP.  Gaylord Shih, CMA 06/03/2019 1:59 PM

## 2019-06-03 NOTE — Telephone Encounter (Signed)
TELEPHONE CALL NOTE  [name] has been deemed a candidate for a follow-up tele-health visit to limit community exposure during the Covid-19 pandemic. I spoke with the patient via phone to ensure availability of phone/video source, confirm preferred email & phone number, discuss instructions and expectations, and review consent.   I reminded [name] to be prepared with any vital sign and/or heart rhythm information that could potentially be obtained via home monitoring, at the time of his visit.  Finally, I reminded [name] to expect an e-mail containing a link for their video-based visit approximately 15 minutes before his visit, or alternatively, a phone call at the time of his visit if his visit is planned to be a phone encounter.  Did the patient verbally consent to treatment as below? YES  [name], CMA 06/03/2019 1:59 PM  CONSENT FOR TELE-HEALTH VISIT - PLEASE REVIEW  I hereby voluntarily request, consent and authorize [facility] and its employed or contracted physicians, physician assistants, nurse practitioners or other licensed health care professionals (the Practitioner), to provide me with telemedicine health care services (the "Services") as deemed necessary by the treating Practitioner. I acknowledge and consent to receive the Services by the Practitioner via telemedicine. I understand that the telemedicine visit will involve communicating with the Practitioner through telephonic communication technology and the disclosure of certain medical information by electronic transmission. I acknowledge that I have been given the opportunity to request an in-person assessment or other available alternative prior to the telemedicine visit and am voluntarily participating in the telemedicine visit.  I understand that I have the right to withhold or withdraw my consent to the use of telemedicine in the course of my care at any time, without affecting my right  to future care or treatment, and that the Practitioner or I may terminate the telemedicine visit at any time. I understand that I have the right to inspect all information obtained and/or recorded in the course of the telemedicine visit and may receive copies of available information for a reasonable fee.  I understand that some of the potential risks of receiving the Services via telemedicine include:  Marland Kitchen Delay or interruption in medical evaluation due to technological equipment failure or disruption; . Information transmitted may not be sufficient (e.g. poor resolution of images) to allow for appropriate medical decision making by the Practitioner; and/or  . In rare instances, security protocols could fail, causing a breach of personal health information.  Furthermore, I acknowledge that it is my responsibility to provide information about my medical history, conditions and care that is complete and accurate to the best of my ability. I acknowledge that Practitioner's advice, recommendations, and/or decision may be based on factors not within their control, such as incomplete or inaccurate data provided by me or lack of visual representation. I understand that the practice of medicine is not an exact science and that Practitioner makes no warranties or guarantees regarding treatment outcomes. I acknowledge that I will receive a copy of this consent concurrently upon execution via email to the email address I last provided but may also request a printed copy by calling the office of [facility].    I understand that my insurance may be billed for this visit.   I have read or had this consent read to me. . I understand the contents of this consent, which adequately explains the benefits and risks of the Services being provided via telemedicine.  Marland Kitchen  I have been provided ample opportunity to ask questions regarding this consent and the Services and have had my questions answered to my  satisfaction. . I give my informed consent for the services to be provided through the use of telemedicine in my medical care  By participating in this telemedicine visit I agree to the above.

## 2019-06-04 ENCOUNTER — Ambulatory Visit: Payer: Medicare HMO | Attending: Family | Admitting: Family

## 2019-06-04 ENCOUNTER — Other Ambulatory Visit: Payer: Self-pay

## 2019-06-04 VITALS — Wt 299.0 lb

## 2019-06-04 DIAGNOSIS — I10 Essential (primary) hypertension: Secondary | ICD-10-CM

## 2019-06-04 DIAGNOSIS — I5022 Chronic systolic (congestive) heart failure: Secondary | ICD-10-CM

## 2019-06-04 NOTE — Patient Instructions (Signed)
Continue weighing daily and call for an overnight weight gain of > 2 pounds or a weekly weight gain of >5 pounds. 

## 2019-06-04 NOTE — Progress Notes (Signed)
Virtual Visit via Telephone Note   Evaluation Performed:  Follow-up visit  This visit type was conducted due to national recommendations for restrictions regarding the COVID-19 Pandemic (e.g. social distancing).  This format is felt to be most appropriate for this patient at this time.  All issues noted in this document were discussed and addressed.  No physical exam was performed (except for noted visual exam findings with Video Visits).  Please refer to the patient's chart (MyChart message for video visits and phone note for telephone visits) for the patient's consent to telehealth for [facility]  Date:  06/04/2019   ID:  [name], DOB [birth year], MRN [id number]  Patient Location:  [place] [address]   Provider location:   [facility] [place] [address], [place] [address]  PCP:  Marguerita Merles, MD  Cardiologist:  Kathlyn Sacramento, MD  Electrophysiologist:  None   Chief Complaint:  Shortness of breath  History of Present Illness:    [name] is a 84 y.o. male who presents via audio/video conferencing for a telehealth visit today.  Patient verified DOB and address.  The patient does not have symptoms concerning for COVID-19 infection (fever, chills, cough, or new SHORTNESS OF BREATH).   Patient reports minimal shortness of breath upon moderate exertion. He says that this has been present for several years. He has associated knee pain along with this. He denies any dizziness, swelling in his legs/ abdomen, palpitations, chest pain, difficulty sleeping, fatigue or weight gain. Says that his weight has ranged from 295-299 pounds and that he's currently not taking his diuretic.   Prior CV studies:   The following studies were reviewed today:  Echo report from 04/01/2019 reviewed and showed an EF of 45-50%  Past Medical History:  Diagnosis Date  . Alcoholism ([place])   . Asthma   . Bipolar affective disorder ([place])    . Chronic combined systolic and diastolic CHF (congestive heart failure) ([place])    a. 08/2016 Echo: EF 40-45%, mild AS, mild to mod MR, mildly dil LA/RA, mild-mod TR; b. 07/2017 Echo: EF 40-45%, mod LVH, Gr1 DD, mild to mod AS, mildly dil LA, nl RV fxn; c. 03/2019 Echo: EF 45-50%, AS (not severe). Mod dil PA.  . CKD (chronic kidney disease), stage III ([place])   . COPD (chronic obstructive pulmonary disease) ([place])   . Coronary artery disease    a. 01/2004 s/p PCI and Taxus DES to dRCA (3.5 x 12 mm); b. 07/2017 Lexiscan MV: no ischemia. Sm area of apicl thinning, likely attenuation. EF 33% (GI uptake noted)-->Low risk; c. 03/2019 Inf STEMI/PCI: LM nl, LAD min irregs, D1 20ost, RI 20ost, LCX nl, RCA 90p/45m (4.0x26 Resolute Onyx DES), 59m (4.0x15 Resolute Onyx DES), 10d ISR.  Marland Kitchen Degenerative joint disease   . Essential hypertension   . Hyperlipidemia   . Hypertension   . Ischemic cardiomyopathy    a. 08/2016 Echo: EF 40-45%; b. 07/2017 Echo: EF 40-45%; c. 03/2019 Echo: EF 45-50%.  . Prostate CA ([place])    prostate ca dx 20 yrs ago  . PVD (peripheral vascular disease) ([place])   . Tobacco abuse    Past Surgical History:  Procedure Laterality Date  . CARDIAC CATHETERIZATION    . CORONARY ANGIOGRAPHY N/A 04/01/2019   Procedure: CORONARY ANGIOGRAPHY;  Surgeon: Nelva Bush, MD;  Location: [place] [facility];  Service: Cardiovascular;  Laterality: N/A;  . CORONARY ANGIOPLASTY WITH  STENT PLACEMENT    . CORONARY/GRAFT ACUTE MI REVASCULARIZATION N/A 04/01/2019   Procedure: Coronary/Graft Acute MI Revascularization;  Surgeon: Nelva Bush, MD;  Location: [facility];  Service: Cardiovascular;  Laterality: N/A;  . TOTAL HIP ARTHROPLASTY     right     Current Meds  Medication Sig  . albuterol (VENTOLIN HFA) 108 (90 Base) MCG/ACT inhaler Inhale 2 puffs into the lungs every 6 (six) hours as needed for wheezing or shortness of breath.  Marland Kitchen amLODipine (NORVASC) 5 MG tablet Take 0.5 tablets (2.5 mg  total) by mouth daily.  Marland Kitchen aspirin EC 81 MG tablet Take 1 tablet (81 mg total) by mouth daily.  Marland Kitchen atorvastatin (LIPITOR) 40 MG tablet Take 1 tablet (40 mg total) by mouth daily.  . carvedilol (COREG) 12.5 MG tablet Take 1 tablet (12.5 mg total) by mouth 2 (two) times daily with a meal. (Patient taking differently: Take 25 mg by mouth daily. )  . clopidogrel (PLAVIX) 75 MG tablet Take 1 tablet (75 mg total) by mouth daily.  . isosorbide mononitrate (IMDUR) 30 MG 24 hr tablet Take 1 tablet (30 mg total) by mouth daily.  Marland Kitchen losartan (COZAAR) 25 MG tablet Take 12.5 mg by mouth daily.  . mometasone-formoterol (DULERA) 200-5 MCG/ACT AERO Inhale 2 puffs into the lungs 2 (two) times daily.  . nitroGLYCERIN (NITROSTAT) 0.4 MG SL tablet Take 0.4 mg by mouth every 5 (five) minutes x 3 doses as needed for chest pain. As needed for chest pain     Allergies:   Tramadol, Amlodipine, Benazepril-hydrochlorothiazide, Chlorthalidone, Lisinopril, and Other   Social History   Tobacco Use  . Smoking status: Former Smoker    Packs/day: 1.00    Years: 50.00    Pack years: 50.00    Types: Cigarettes  . Smokeless tobacco: Never Used  Substance Use Topics  . Alcohol use: Yes    Comment: social   . Drug use: No     Family Hx: The patient's family history includes Heart attack in his father and mother; Hyperlipidemia in his father and mother; Hypertension in his father and mother. There is no history of Prostate cancer, Bladder Cancer, or Kidney cancer.  ROS:   Please see the history of present illness.     All other systems reviewed and are negative.   Labs/Other Tests and Data Reviewed:    Recent Labs: 04/02/2019: ALT 15 04/12/2019: B Natriuretic Peptide 182.0 04/14/2019: BUN 33; Creatinine, Ser 2.49; Hemoglobin 10.0; Platelets 252; Potassium 4.3; Sodium 136   Recent Lipid Panel Lab Results  Component Value Date/Time   CHOL 104 04/01/2019 04:31 AM   CHOL 189 04/26/2016 08:10 AM   CHOL 138 05/31/2014  04:16 AM   TRIG 30 04/01/2019 04:31 AM   TRIG 75 05/31/2014 04:16 AM   HDL 43 04/01/2019 04:31 AM   HDL 66 04/26/2016 08:10 AM   HDL 40 05/31/2014 04:16 AM   CHOLHDL 2.4 04/01/2019 04:31 AM   LDLCALC 55 04/01/2019 04:31 AM   LDLCALC 97 04/26/2016 08:10 AM   LDLCALC 83 05/31/2014 04:16 AM    Wt Readings from Last 3 Encounters:  06/04/19 299 lb (135.6 kg)  04/25/19 293 lb (132.9 kg)  04/15/19 296 lb 11.8 oz (134.6 kg)     Exam:    Vital Signs:  Wt 299 lb (135.6 kg) Comment: self-reported  BMI 46.83 kg/m    Well nourished, well developed male in no  acute distress.   ASSESSMENT & PLAN:  1.Chronic heart failure with minimally reduced ejection fraction- - NYHA class II - euvolemic today based on patient's description of symptoms - weighing daily and says that his weight has fluctuated between 295-299 pounds; reminded to call for an overnight weight gain of >2 pounds or a weekly weight gain of >5 pounds - hasn't been taking his furosemide for awhile and he was instructed to take his furosemide as needed if he has weight gain per above - sees cardiology Fletcher Anon) 06/19/2019 - not adding salt and is trying to eat low sodium foods - EF >40% so would not qualify for entresto - BNP 04/12/2019 was 182.0  2: HTN-   - not checking his BP at home - had telemedicine visit with PCP [name]) about a month ago - BMP 04/14/2019 reviewed and showed sodium 136, potassium 4.3. creatinine 2.49 and GFR 27    COVID-19 Education: The signs and symptoms of COVID-19 were discussed with the patient and how to seek care for testing (follow up with PCP or arrange E-visit).  The importance of social distancing was discussed today.  Patient Risk:   After full review of this patients clinical status, I feel that they are at least moderate risk at this time.  Time:   Today, I have spent 14 minutes with the patient with telehealth technology discussing medications, diet and symptoms to report.      Medication Adjustments/Labs and Tests Ordered: Current medicines are reviewed at length with the patient today.  Concerns regarding medicines are outlined above.   Tests Ordered: No orders of the defined types were placed in this encounter.  Medication Changes: No orders of the defined types were placed in this encounter.   Disposition: Follow-up 3 months or sooner for any questions/problems before then.   Signed, Alisa Graff, FNP  06/04/2019 11:50 AM    [facility]

## 2019-06-16 ENCOUNTER — Telehealth: Payer: Self-pay | Admitting: Cardiovascular Disease

## 2019-06-16 NOTE — Telephone Encounter (Signed)
Returned call to [employer] to update her with advice from Dr. Fletcher Anon.   She reported that she will ensure that Mr. Cyr has not had chest pain before we d/c his Imdur and will give [place] update.

## 2019-06-16 NOTE — Telephone Encounter (Signed)
Patient's PCP states patient is requesting Viagra, and wants to make sure Dr. Jacklynn Ganong is okay with this. Please call and advise.

## 2019-06-16 NOTE — Telephone Encounter (Signed)
Call to Jonelle Sidle, RN with [facility].   She reports that PCP is wanting to prescribe Mr. Fout with Viagra for ED.   I let Tiffany know that Viagra is contraindicated in conjunction with Imdur. RN is unaware at this time the frequency the patient would be needing impotence medication.   I let her know that I would reach out to Dr. Fletcher Anon to see what he would recommend in his particular case.   Routed to Dr. Fletcher Anon to further advise.

## 2019-06-16 NOTE — Telephone Encounter (Signed)
If he wants to use Viagra, we will have to stop Imdur which I am fine with as long as he is not having chest pain.

## 2019-06-19 ENCOUNTER — Ambulatory Visit: Payer: Medicare HMO | Admitting: Cardiovascular Disease

## 2019-09-03 NOTE — Progress Notes (Signed)
Patient ID: [name], male    DOB: 01/23/1935, 84 y.o.   MRN: [id number]  HPI  [name] is a 84 y/o male with a history of CAD, asthma, hyperlipidemia, HTN, CKD, COPD, alcoholism, PVD, bipolar, former tobacco use and chronic heart failure.   Echo report from 04/01/2019 reviewed and showed an EF of 45-50% along with moderately dilated pulmonary artery.   Catheterization done 04/01/2019 showed: 1. Severe single-vessel coronary artery disease with sequential 80-95% proximal and mid RCA stenoses with heavy thrombus causing inferior STEMI. 2. Mild, non-obstructive coronary artery disease involving the left coronary artery. 3. Patent distal RCA Taxus stent with mild in-stent restenosis. 4. Challenging but ultimately successful PCI to the proximal and mid RCA lesions with non-overlapping Resolute Onyx 4.0 x 26 mm (proximal - dilated to 4.4 mm) and 4.0 x 15 mm (distal - dilated to 4.2 mm) drug-eluting stents with 0% residual stenosis and TIMI-3 flow.  Admitted 04/12/2019 due to acute on chronic heart failure. Cardiology consult obtained. COVID test negative. Initially given IV lasix and then transitioned to oral diuretics. Discharged after 3 days with home health PT.   He presents today for a follow-up visit with a chief complaint of minimal shortness of breath upon moderate exertion. He has associated light-headedness, cough, fatigue, intermittent chest pain and chronic difficulty sleeping along with this. He denies any abdominal distention, palpitations, pedal edema or weight gain.   He says that he's taking his diuretic twice daily and it's keeping him up "all night".   Past Medical History:  Diagnosis Date  . Alcoholism ([place])   . Asthma   . Bipolar affective disorder ([place])   . Chronic combined systolic and diastolic CHF (congestive heart failure) ([place])    a. 08/2016 Echo: EF 40-45%, mild AS, mild to mod Nicholas, mildly dil LA/RA, mild-mod TR; b. 07/2017 Echo: EF 40-45%, mod LVH, Gr1 DD, mild to mod  AS, mildly dil LA, nl RV fxn; c. 03/2019 Echo: EF 45-50%, AS (not severe). Mod dil PA.  . CKD (chronic kidney disease), stage III ([place])   . COPD (chronic obstructive pulmonary disease) ([place])   . Coronary artery disease    a. 01/2004 s/p PCI and Taxus DES to dRCA (3.5 x 12 mm); b. 07/2017 Lexiscan MV: no ischemia. Sm area of apicl thinning, likely attenuation. EF 33% (GI uptake noted)-->Low risk; c. 03/2019 Inf STEMI/PCI: LM nl, LAD min irregs, D1 20ost, RI 20ost, LCX nl, RCA 90p/53m (4.0x26 Resolute Onyx DES), 79m (4.0x15 Resolute Onyx DES), 10d ISR.  Marland Kitchen Degenerative joint disease   . Essential hypertension   . Hyperlipidemia   . Hypertension   . Ischemic cardiomyopathy    a. 08/2016 Echo: EF 40-45%; b. 07/2017 Echo: EF 40-45%; c. 03/2019 Echo: EF 45-50%.  . Prostate CA ([place])    prostate ca dx 20 yrs ago  . PVD (peripheral vascular disease) ([place])   . Tobacco abuse    Past Surgical History:  Procedure Laterality Date  . CARDIAC CATHETERIZATION    . CORONARY ANGIOGRAPHY N/A 04/01/2019   Procedure: CORONARY ANGIOGRAPHY;  Surgeon: Nelva Bush, MD;  Location: [facility];  Service: Cardiovascular;  Laterality: N/A;  . CORONARY ANGIOPLASTY WITH STENT PLACEMENT    . CORONARY/GRAFT ACUTE MI REVASCULARIZATION N/A 04/01/2019   Procedure: Coronary/Graft Acute MI Revascularization;  Surgeon: Nelva Bush, MD;  Location: [facility];  Service: Cardiovascular;  Laterality: N/A;  . TOTAL HIP ARTHROPLASTY     right   Family History  Problem Relation Age of Onset  . Hypertension Mother   . Hyperlipidemia Mother   . Heart attack Mother   . Hypertension Father   . Hyperlipidemia Father   . Heart attack Father   . Prostate cancer Neg Hx   . Bladder Cancer Neg Hx   . Kidney cancer Neg Hx    Social History   Tobacco Use  . Smoking status: Former Smoker    Packs/day: 1.00    Years: 50.00    Pack years: 50.00    Types: Cigarettes  . Smokeless tobacco: Never Used  Substance  Use Topics  . Alcohol use: Yes    Comment: social    Allergies  Allergen Reactions  . Tramadol Nausea Only  . Amlodipine Other (See Comments)    Constipation  Constipation Constipation  . Benazepril-Hydrochlorothiazide Other (See Comments)    Constipation  Constipation Constipation  . Chlorthalidone Other (See Comments)    Hyponatremia Hyponatremia  . Lisinopril Other (See Comments)  . Other Other (See Comments)    "ANY BLOOD PRESSURE MEDICATIONS THAT I'VE TRIED" - PT. DOES NOT REMEMBER WHICH ONES "ANY BLOOD PRESSURE MEDICATIONS THAT I'VE TRIED"- PT. DOES NOT REMEMBER WHICH ONES- CONSTIPATION   Prior to Admission medications   Medication Sig Start Date End Date Taking? Authorizing Provider  albuterol (VENTOLIN HFA) 108 (90 Base) MCG/ACT inhaler Inhale 2 puffs into the lungs every 6 (six) hours as needed for wheezing or shortness of breath.   Yes [provider]  amLODipine (NORVASC) 5 MG tablet Take 0.5 tablets (2.5 mg total) by mouth daily. 04/15/19  Yes Fritzi Mandes, MD  aspirin EC 81 MG tablet Take 1 tablet (81 mg total) by mouth daily. 04/10/19  Yes [name], MD  atorvastatin (LIPITOR) 40 MG tablet Take 1 tablet (40 mg total) by mouth daily. 04/10/19  Yes [name], MD  carvedilol (COREG) 12.5 MG tablet Take 1 tablet (12.5 mg total) by mouth 2 (two) times daily with a meal. Patient taking differently: Take 25 mg by mouth daily.  04/02/19  Yes Bettey Costa, MD  clopidogrel (PLAVIX) 75 MG tablet Take 1 tablet (75 mg total) by mouth daily. 04/16/19  Yes Fritzi Mandes, MD  furosemide (LASIX) 40 MG tablet Take 1 tablet (40 mg total) by mouth daily. Patient taking differently: Take 40 mg by mouth 2 (two) times daily.  04/16/19  Yes Fritzi Mandes, MD  isosorbide mononitrate (IMDUR) 30 MG 24 hr tablet Take 1 tablet (30 mg total) by mouth daily. 04/10/19  Yes [name], MD  losartan (COZAAR) 25 MG tablet Take 12.5 mg by mouth daily.   Yes [provider]   mometasone-formoterol (DULERA) 200-5 MCG/ACT AERO Inhale 2 puffs into the lungs 2 (two) times daily. 04/15/19  Yes Fritzi Mandes, MD  nitroGLYCERIN (NITROSTAT) 0.4 MG SL tablet Take 0.4 mg by mouth every 5 (five) minutes x 3 doses as needed for chest pain. As needed for chest pain 12/06/15  Yes [provider]     Review of Systems  Constitutional: Positive for fatigue. Negative for appetite change.  HENT: Positive for congestion. Negative for postnasal drip and sore throat.   Eyes: Negative.   Respiratory: Positive for cough (dry) and shortness of breath.   Cardiovascular: Positive for chest pain ("sometimes"). Negative for palpitations and leg swelling.  Gastrointestinal: Negative for abdominal distention and abdominal pain.  Endocrine: Negative.   Genitourinary: Negative.   Musculoskeletal: Positive for arthralgias (knee pain). Negative for back pain.  Skin: Negative.   Allergic/Immunologic: Negative.   Neurological: Positive for light-headedness. Negative for dizziness.  Hematological: Negative for adenopathy. Does not bruise/bleed easily.  Psychiatric/Behavioral: Positive for sleep disturbance.    Vitals:   09/04/19 1330  BP: 111/66  Pulse: 65  Resp: 18  SpO2: 97%  Weight: (!) 310 lb 4 oz (140.7 kg)  Height: 5\' 7"  (1.702 m)   Wt Readings from Last 3 Encounters:  09/04/19 (!) 310 lb 4 oz (140.7 kg)  06/04/19 299 lb (135.6 kg)  04/25/19 293 lb (132.9 kg)   Lab Results  Component Value Date   CREATININE 2.49 (H) 04/14/2019   CREATININE 2.27 (H) 04/13/2019   CREATININE 2.03 (H) 04/12/2019    Physical Exam Vitals signs and nursing note reviewed.  Constitutional:      Appearance: He is well-developed.  HENT:     Head: Normocephalic and atraumatic.  Neck:     Musculoskeletal: Neck supple.     Vascular: No JVD.  Cardiovascular:     Rate and Rhythm: Normal rate and regular rhythm.  Pulmonary:     Effort: Pulmonary effort is normal. No respiratory distress.      Breath sounds: Examination of the right-lower field reveals rhonchi. Examination of the left-lower field reveals rhonchi. Rhonchi present.  Abdominal:     Palpations: Abdomen is soft.     Tenderness: There is no abdominal tenderness.  Musculoskeletal:     Right lower leg: He exhibits no tenderness. No edema.     Left lower leg: He exhibits no tenderness. No edema.  Skin:    General: Skin is warm and dry.  Neurological:     General: No focal deficit present.     Mental Status: He is alert and oriented to person, place, and time.  Psychiatric:        Mood and Affect: Mood normal.        Behavior: Behavior normal.     Assessment & Plan:  1.Chronic heart failure with mildly reduced ejection fraction- - NYHA class II - euvolemic today  - not weighing daily and he was instructed to start weighing every day so that he can call for an overnight weight gain of >2 pounds or a weekly weight gain of >5 pounds - will decrease his furosemide to daily with PRN afternoon dose for above weight gain, swelling or worsening shortness of breath - had telemedicine visit with cardiology [name]) 04/10/2019 - not adding salt and is trying to eat low sodium foods - EF >40% so would not qualify for entresto - BNP 04/12/2019 was 182.0 - currently has home health  2: HTN- - BP looks good today - had telemedicine visit with PCP [name]) about a month ago - BMP 04/14/2019 reviewed and showed sodium 136, potassium 4.3. creatinine 2.49 and GFR 27    Medication list was reviewed.  Return in 6 weeks or sooner for any questions/problems before then.

## 2019-09-04 ENCOUNTER — Ambulatory Visit: Payer: Medicare HMO | Attending: Family | Admitting: Family

## 2019-09-04 ENCOUNTER — Encounter: Payer: Self-pay | Admitting: Family

## 2019-09-04 ENCOUNTER — Other Ambulatory Visit: Payer: Self-pay

## 2019-09-04 VITALS — BP 111/66 | HR 65 | Resp 18 | Ht 67.0 in | Wt 310.2 lb

## 2019-09-04 DIAGNOSIS — F319 Bipolar disorder, unspecified: Secondary | ICD-10-CM | POA: Diagnosis not present

## 2019-09-04 DIAGNOSIS — Z8249 Family history of ischemic heart disease and other diseases of the circulatory system: Secondary | ICD-10-CM | POA: Diagnosis not present

## 2019-09-04 DIAGNOSIS — Z87891 Personal history of nicotine dependence: Secondary | ICD-10-CM | POA: Diagnosis not present

## 2019-09-04 DIAGNOSIS — Z7951 Long term (current) use of inhaled steroids: Secondary | ICD-10-CM | POA: Diagnosis not present

## 2019-09-04 DIAGNOSIS — Z7902 Long term (current) use of antithrombotics/antiplatelets: Secondary | ICD-10-CM | POA: Insufficient documentation

## 2019-09-04 DIAGNOSIS — Z955 Presence of coronary angioplasty implant and graft: Secondary | ICD-10-CM | POA: Diagnosis not present

## 2019-09-04 DIAGNOSIS — I255 Ischemic cardiomyopathy: Secondary | ICD-10-CM | POA: Insufficient documentation

## 2019-09-04 DIAGNOSIS — I2119 ST elevation (STEMI) myocardial infarction involving other coronary artery of inferior wall: Secondary | ICD-10-CM | POA: Insufficient documentation

## 2019-09-04 DIAGNOSIS — I739 Peripheral vascular disease, unspecified: Secondary | ICD-10-CM | POA: Diagnosis not present

## 2019-09-04 DIAGNOSIS — Z8546 Personal history of malignant neoplasm of prostate: Secondary | ICD-10-CM | POA: Diagnosis not present

## 2019-09-04 DIAGNOSIS — I252 Old myocardial infarction: Secondary | ICD-10-CM | POA: Diagnosis not present

## 2019-09-04 DIAGNOSIS — I130 Hypertensive heart and chronic kidney disease with heart failure and stage 1 through stage 4 chronic kidney disease, or unspecified chronic kidney disease: Secondary | ICD-10-CM | POA: Insufficient documentation

## 2019-09-04 DIAGNOSIS — J45909 Unspecified asthma, uncomplicated: Secondary | ICD-10-CM | POA: Diagnosis not present

## 2019-09-04 DIAGNOSIS — I2510 Atherosclerotic heart disease of native coronary artery without angina pectoris: Secondary | ICD-10-CM | POA: Insufficient documentation

## 2019-09-04 DIAGNOSIS — Z885 Allergy status to narcotic agent status: Secondary | ICD-10-CM | POA: Insufficient documentation

## 2019-09-04 DIAGNOSIS — I10 Essential (primary) hypertension: Secondary | ICD-10-CM

## 2019-09-04 DIAGNOSIS — Z7982 Long term (current) use of aspirin: Secondary | ICD-10-CM | POA: Diagnosis not present

## 2019-09-04 DIAGNOSIS — Z79899 Other long term (current) drug therapy: Secondary | ICD-10-CM | POA: Insufficient documentation

## 2019-09-04 DIAGNOSIS — I5022 Chronic systolic (congestive) heart failure: Secondary | ICD-10-CM

## 2019-09-04 DIAGNOSIS — N183 Chronic kidney disease, stage 3 (moderate): Secondary | ICD-10-CM | POA: Insufficient documentation

## 2019-09-04 DIAGNOSIS — F1020 Alcohol dependence, uncomplicated: Secondary | ICD-10-CM | POA: Diagnosis not present

## 2019-09-04 DIAGNOSIS — I5042 Chronic combined systolic (congestive) and diastolic (congestive) heart failure: Secondary | ICD-10-CM | POA: Diagnosis not present

## 2019-09-04 DIAGNOSIS — E785 Hyperlipidemia, unspecified: Secondary | ICD-10-CM | POA: Diagnosis not present

## 2019-09-04 NOTE — Patient Instructions (Addendum)
Begin weighing daily and call for an overnight weight gain of > 2 pounds or a weekly weight gain of >5 pounds.  Take your furosemide every morning and take the second dose if you need it for the above weight gain, any swelling or worsening shortness of breath.

## 2019-10-13 NOTE — Progress Notes (Signed)
Patient ID: [name], male    DOB: [birth year], 84 y.o.   MRN: [id number]  HPI  [name] is a 84 y/o male with a history of CAD, asthma, hyperlipidemia, HTN, CKD, COPD, alcoholism, PVD, bipolar, former tobacco use and chronic heart failure.   Echo report from 04/01/2019 reviewed and showed an EF of 45-50% along with moderately dilated pulmonary artery.   Catheterization done 04/01/2019 showed: 1. Severe single-vessel coronary artery disease with sequential 80-95% proximal and mid RCA stenoses with heavy thrombus causing inferior STEMI. 2. Mild, non-obstructive coronary artery disease involving the left coronary artery. 3. Patent distal RCA Taxus stent with mild in-stent restenosis. 4. Challenging but ultimately successful PCI to the proximal and mid RCA lesions with non-overlapping Resolute Onyx 4.0 x 26 mm (proximal - dilated to 4.4 mm) and 4.0 x 15 mm (distal - dilated to 4.2 mm) drug-eluting stents with 0% residual stenosis and TIMI-3 flow.  Admitted 04/12/2019 due to acute on chronic heart failure. Cardiology consult obtained. COVID test negative. Initially given IV lasix and then transitioned to oral diuretics. Discharged after 3 days with home health PT.   He presents today for a follow-up visit with a chief complaint of shortness of breath with minimal exertion. This is associated with chronic knee pain. He denies fatigue, cough, chest pain, leg swelling, palpitations, abdominal distention, dizziness, and trouble sleeping. He has a CPAP but says he doesn't wear it because he has to get up 3-4 times during the night to go to the bathroom. He weighs himself daily and his weight has been stable. He has not been adding salt to his food, however he eats a lot of frozen meals. He is currently on the wait list for meals on wheels. He has not been able to exercise because of his shortness of breath. He was recently started on flonase and albuterol for his shortness of breath and congestion.   Past  Medical History:  Diagnosis Date  . Alcoholism ([place])   . Asthma   . Bipolar affective disorder ([place])   . Chronic combined systolic and diastolic CHF (congestive heart failure) ([place])    a. 08/2016 Echo: EF 40-45%, mild AS, mild to mod Nicholas, mildly dil LA/RA, mild-mod TR; b. 07/2017 Echo: EF 40-45%, mod LVH, Gr1 DD, mild to mod AS, mildly dil LA, nl RV fxn; c. 03/2019 Echo: EF 45-50%, AS (not severe). Mod dil PA.  . CKD (chronic kidney disease), stage III ([place])   . COPD (chronic obstructive pulmonary disease) ([place])   . Coronary artery disease    a. 01/2004 s/p PCI and Taxus DES to dRCA (3.5 x 12 mm); b. 07/2017 Lexiscan MV: no ischemia. Sm area of apicl thinning, likely attenuation. EF 33% (GI uptake noted)-->Low risk; c. 03/2019 Inf STEMI/PCI: LM nl, LAD min irregs, D1 20ost, RI 20ost, LCX nl, RCA 90p/9m (4.0x26 Resolute Onyx DES), 26m (4.0x15 Resolute Onyx DES), 10d ISR.  Marland Kitchen Degenerative joint disease   . Essential hypertension   . Hyperlipidemia   . Hypertension   . Ischemic cardiomyopathy    a. 08/2016 Echo: EF 40-45%; b. 07/2017 Echo: EF 40-45%; c. 03/2019 Echo: EF 45-50%.  . Prostate CA ([place])    prostate ca dx 20 yrs ago  . PVD (peripheral vascular disease) ([place])   . Tobacco abuse    Past Surgical History:  Procedure Laterality Date  . CARDIAC CATHETERIZATION    . CORONARY ANGIOGRAPHY N/A 04/01/2019   Procedure: CORONARY ANGIOGRAPHY;  Surgeon: Nelva Bush,  MD;  Location: [facility];  Service: Cardiovascular;  Laterality: N/A;  . CORONARY ANGIOPLASTY WITH STENT PLACEMENT    . CORONARY/GRAFT ACUTE MI REVASCULARIZATION N/A 04/01/2019   Procedure: Coronary/Graft Acute MI Revascularization;  Surgeon: Nelva Bush, MD;  Location: [facility];  Service: Cardiovascular;  Laterality: N/A;  . TOTAL HIP ARTHROPLASTY     right   Family History  Problem Relation Age of Onset  . Hypertension Mother   . Hyperlipidemia Mother   . Heart attack Mother   . Hypertension Father    . Hyperlipidemia Father   . Heart attack Father   . Prostate cancer Neg Hx   . Bladder Cancer Neg Hx   . Kidney cancer Neg Hx    Social History   Tobacco Use  . Smoking status: Former Smoker    Packs/day: 1.00    Years: 50.00    Pack years: 50.00    Types: Cigarettes  . Smokeless tobacco: Never Used  Substance Use Topics  . Alcohol use: Yes    Comment: social    Allergies  Allergen Reactions  . Tramadol Nausea Only  . Amlodipine Other (See Comments)    Constipation  Constipation Constipation  . Benazepril-Hydrochlorothiazide Other (See Comments)    Constipation  Constipation Constipation  . Chlorthalidone Other (See Comments)    Hyponatremia Hyponatremia  . Lisinopril Other (See Comments)  . Other Other (See Comments)    "ANY BLOOD PRESSURE MEDICATIONS THAT I'VE TRIED" - PT. DOES NOT REMEMBER WHICH ONES "ANY BLOOD PRESSURE MEDICATIONS THAT I'VE TRIED"- PT. DOES NOT REMEMBER WHICH ONES- CONSTIPATION   Prior to Admission medications   Medication Sig Start Date End Date Taking? Authorizing Provider  amLODipine (NORVASC) 5 MG tablet Take 0.5 tablets (2.5 mg total) by mouth daily. 04/15/19  Yes Fritzi Mandes, MD  aspirin EC 81 MG tablet Take 1 tablet (81 mg total) by mouth daily. 04/10/19  Yes [name], MD  atorvastatin (LIPITOR) 40 MG tablet Take 1 tablet (40 mg total) by mouth daily. 04/10/19  Yes [name], MD  clopidogrel (PLAVIX) 75 MG tablet Take 1 tablet (75 mg total) by mouth daily. 04/16/19  Yes Fritzi Mandes, MD  furosemide (LASIX) 40 MG tablet Take 1 tablet (40 mg total) by mouth daily. And additional 40mg  in the afternoon as needed 10/14/19  Yes Darylene Price A, FNP  isosorbide mononitrate (IMDUR) 30 MG 24 hr tablet Take 1 tablet (30 mg total) by mouth daily. 04/10/19  Yes [name], MD  losartan (COZAAR) 25 MG tablet TAKE ONE-HALF TABLET BY MOUTH DAILY 10/14/19  Yes Hackney, Tina A, FNP  mometasone-formoterol (DULERA) 200-5 MCG/ACT AERO  Inhale 2 puffs into the lungs 2 (two) times daily. 04/15/19  Yes Fritzi Mandes, MD  albuterol (VENTOLIN HFA) 108 (90 Base) MCG/ACT inhaler USE 2 PUFFS EVERY 4 HOURS AS NEEDED 10/14/19   Darylene Price A, FNP  carvedilol (COREG) 12.5 MG tablet Take 1 tablet (12.5 mg total) by mouth 2 (two) times daily with a meal. Patient taking differently: Take 25 mg by mouth daily.  04/02/19   Bettey Costa, MD  nitroGLYCERIN (NITROSTAT) 0.4 MG SL tablet Take 0.4 mg by mouth every 5 (five) minutes x 3 doses as needed for chest pain. As needed for chest pain 12/06/15   [provider]   Review of Systems  Constitutional: Negative for appetite change and fatigue.  HENT: Positive for congestion. Negative for postnasal drip and sore throat.   Eyes: Negative.  Respiratory: Positive for shortness of breath (with minimal exertion). Negative for cough.   Cardiovascular: Negative for chest pain, palpitations and leg swelling.  Gastrointestinal: Negative for abdominal distention and abdominal pain.  Endocrine: Negative.   Genitourinary: Negative.   Musculoskeletal: Positive for arthralgias (knee pain). Negative for back pain.  Skin: Negative.   Allergic/Immunologic: Negative.   Neurological: Negative for dizziness and light-headedness.  Hematological: Negative for adenopathy. Does not bruise/bleed easily.  Psychiatric/Behavioral: Negative for sleep disturbance (1 pillow, does not wear CPAP but has one).   Vitals:   10/15/19 1136  BP: (!) 150/70  Pulse: 84  Resp: 20  SpO2: 96%   Filed Weights   10/15/19 1136  Weight: (!) 305 lb (138.3 kg)   Lab Results  Component Value Date   CREATININE 2.49 (H) 04/14/2019   CREATININE 2.27 (H) 04/13/2019   CREATININE 2.03 (H) 04/12/2019   Physical Exam Vitals signs and nursing note reviewed.  Constitutional:      Appearance: He is well-developed.  HENT:     Head: Normocephalic and atraumatic.  Neck:     Musculoskeletal: Neck supple.     Vascular: No JVD.   Cardiovascular:     Rate and Rhythm: Normal rate and regular rhythm.  Pulmonary:     Effort: Pulmonary effort is normal. No respiratory distress.     Breath sounds: Examination of the right-lower field reveals rhonchi. Examination of the left-lower field reveals rhonchi. Wheezing and rhonchi present.  Abdominal:     Palpations: Abdomen is soft.     Tenderness: There is no abdominal tenderness.  Musculoskeletal:     Right lower leg: He exhibits no tenderness. No edema.     Left lower leg: He exhibits no tenderness. No edema.  Skin:    General: Skin is warm and dry.  Neurological:     General: No focal deficit present.     Mental Status: He is alert and oriented to person, place, and time.  Psychiatric:        Mood and Affect: Mood normal.        Behavior: Behavior normal.     Assessment & Plan:  1.Chronic heart failure with mildly reduced ejection fraction- - NYHA class III - euvolemic today  - weighing daily and reminded to call for an overnight weight gain of >2 pounds or a weekly weight gain of >5 pounds - furosemide changed last visit to daily with PRN afternoon dose for above weight gain, swelling or worsening shortness of breath. He has not needed to take a prn dose since then. - had telemedicine visit with cardiology Fletcher Anon) 04/10/2019 - not adding salt and is eating frozen meals. Discussed 2,000mg  sodium diet. He is on the weight list for meals on wheels.  - EF >40% so would not qualify for entresto - BNP 04/12/2019 was 182.0  2: HTN- - BP elevated today - he was started on coreg 25mg  BID in the hospital, however he does not have that medication at home. Will start coreg 6.25mg  BID #180, refills 3.  - saw PCP [name]) a couple days ago - BMP 04/14/2019 reviewed and showed sodium 136, potassium 4.3. creatinine 2.49 and GFR 27    Medication bottles reviewed.  Return in 4 weeks or sooner for any questions/problems before then.

## 2019-10-14 ENCOUNTER — Other Ambulatory Visit: Payer: Self-pay | Admitting: Family

## 2019-10-15 ENCOUNTER — Other Ambulatory Visit: Payer: Self-pay

## 2019-10-15 ENCOUNTER — Encounter: Payer: Self-pay | Admitting: Family

## 2019-10-15 ENCOUNTER — Ambulatory Visit: Payer: Medicare HMO | Attending: Family | Admitting: Family

## 2019-10-15 VITALS — BP 150/70 | HR 84 | Resp 20 | Ht 67.0 in | Wt 305.0 lb

## 2019-10-15 DIAGNOSIS — Z8546 Personal history of malignant neoplasm of prostate: Secondary | ICD-10-CM | POA: Insufficient documentation

## 2019-10-15 DIAGNOSIS — J449 Chronic obstructive pulmonary disease, unspecified: Secondary | ICD-10-CM | POA: Diagnosis not present

## 2019-10-15 DIAGNOSIS — I130 Hypertensive heart and chronic kidney disease with heart failure and stage 1 through stage 4 chronic kidney disease, or unspecified chronic kidney disease: Secondary | ICD-10-CM | POA: Diagnosis present

## 2019-10-15 DIAGNOSIS — N189 Chronic kidney disease, unspecified: Secondary | ICD-10-CM | POA: Insufficient documentation

## 2019-10-15 DIAGNOSIS — I255 Ischemic cardiomyopathy: Secondary | ICD-10-CM | POA: Diagnosis not present

## 2019-10-15 DIAGNOSIS — I10 Essential (primary) hypertension: Secondary | ICD-10-CM

## 2019-10-15 DIAGNOSIS — Z8249 Family history of ischemic heart disease and other diseases of the circulatory system: Secondary | ICD-10-CM | POA: Diagnosis not present

## 2019-10-15 DIAGNOSIS — Z96641 Presence of right artificial hip joint: Secondary | ICD-10-CM | POA: Diagnosis not present

## 2019-10-15 DIAGNOSIS — I5042 Chronic combined systolic (congestive) and diastolic (congestive) heart failure: Secondary | ICD-10-CM | POA: Diagnosis not present

## 2019-10-15 DIAGNOSIS — Z8349 Family history of other endocrine, nutritional and metabolic diseases: Secondary | ICD-10-CM | POA: Insufficient documentation

## 2019-10-15 DIAGNOSIS — Z882 Allergy status to sulfonamides status: Secondary | ICD-10-CM | POA: Diagnosis not present

## 2019-10-15 DIAGNOSIS — M1990 Unspecified osteoarthritis, unspecified site: Secondary | ICD-10-CM | POA: Insufficient documentation

## 2019-10-15 DIAGNOSIS — Z888 Allergy status to other drugs, medicaments and biological substances status: Secondary | ICD-10-CM | POA: Diagnosis not present

## 2019-10-15 DIAGNOSIS — F1021 Alcohol dependence, in remission: Secondary | ICD-10-CM | POA: Diagnosis not present

## 2019-10-15 DIAGNOSIS — F319 Bipolar disorder, unspecified: Secondary | ICD-10-CM | POA: Diagnosis not present

## 2019-10-15 DIAGNOSIS — Z7951 Long term (current) use of inhaled steroids: Secondary | ICD-10-CM | POA: Diagnosis not present

## 2019-10-15 DIAGNOSIS — Z885 Allergy status to narcotic agent status: Secondary | ICD-10-CM | POA: Diagnosis not present

## 2019-10-15 DIAGNOSIS — I2510 Atherosclerotic heart disease of native coronary artery without angina pectoris: Secondary | ICD-10-CM | POA: Insufficient documentation

## 2019-10-15 DIAGNOSIS — Z955 Presence of coronary angioplasty implant and graft: Secondary | ICD-10-CM | POA: Diagnosis not present

## 2019-10-15 DIAGNOSIS — Z79899 Other long term (current) drug therapy: Secondary | ICD-10-CM | POA: Insufficient documentation

## 2019-10-15 DIAGNOSIS — Z7982 Long term (current) use of aspirin: Secondary | ICD-10-CM | POA: Diagnosis not present

## 2019-10-15 DIAGNOSIS — I739 Peripheral vascular disease, unspecified: Secondary | ICD-10-CM | POA: Insufficient documentation

## 2019-10-15 DIAGNOSIS — Z87891 Personal history of nicotine dependence: Secondary | ICD-10-CM | POA: Insufficient documentation

## 2019-10-15 DIAGNOSIS — E785 Hyperlipidemia, unspecified: Secondary | ICD-10-CM | POA: Diagnosis not present

## 2019-10-15 DIAGNOSIS — I252 Old myocardial infarction: Secondary | ICD-10-CM | POA: Insufficient documentation

## 2019-10-15 DIAGNOSIS — Z7902 Long term (current) use of antithrombotics/antiplatelets: Secondary | ICD-10-CM | POA: Diagnosis not present

## 2019-10-15 MED ORDER — CARVEDILOL 6.25 MG PO TABS: 6.2500 mg | ORAL_TABLET | Freq: Two times a day (BID) | ORAL | 3 refills | Status: DC

## 2019-10-15 NOTE — Patient Instructions (Signed)
Continue weighing daily and call for an overnight weight gain of > 2 pounds or a weekly weight gain of >5 pounds.  Start carvedilol 6.25mg  twice a day.

## 2019-10-27 ENCOUNTER — Telehealth: Payer: Self-pay

## 2019-10-27 MED ORDER — ATORVASTATIN CALCIUM 40 MG PO TABS: 40.0000 mg | ORAL_TABLET | Freq: Every day | ORAL | 0 refills | Status: DC

## 2019-10-27 NOTE — Telephone Encounter (Signed)
Requested Prescriptions   Signed Prescriptions Disp Refills  . atorvastatin (LIPITOR) 40 MG tablet 30 tablet 0    Sig: Take 1 tablet (40 mg total) by mouth daily. *NEEDS OFFICE VISIT FOR FURTHER REFILLS*    Authorizing Provider: Kathlyn [place] A    Ordering User: Raelene Bott, BRANDY L

## 2019-10-28 ENCOUNTER — Other Ambulatory Visit: Payer: Self-pay | Admitting: *Deleted

## 2019-10-28 ENCOUNTER — Other Ambulatory Visit: Payer: Self-pay | Admitting: Family

## 2019-10-28 MED ORDER — CLOPIDOGREL BISULFATE 75 MG PO TABS: 75.0000 mg | ORAL_TABLET | Freq: Every day | ORAL | 0 refills | Status: AC

## 2019-10-28 MED ORDER — ISOSORBIDE MONONITRATE ER 30 MG PO TB24: 30.0000 mg | ORAL_TABLET | Freq: Every day | ORAL | 0 refills | Status: DC

## 2019-11-23 NOTE — Progress Notes (Deleted)
Patient ID: [name], male    DOB: 12/15/1935, 84 y.o.   MRN: [id number]  HPI  Mr [name] is a 84 y/o male with a history of CAD, asthma, hyperlipidemia, HTN, CKD, COPD, alcoholism, PVD, bipolar, former tobacco use and chronic heart failure.   Echo report from 04/01/2019 reviewed and showed an EF of 45-50% along with moderately dilated pulmonary artery.   Catheterization done 04/01/2019 showed: 1. Severe single-vessel coronary artery disease with sequential 80-95% proximal and mid RCA stenoses with heavy thrombus causing inferior STEMI. 2. Mild, non-obstructive coronary artery disease involving the left coronary artery. 3. Patent distal RCA Taxus stent with mild in-stent restenosis. 4. Challenging but ultimately successful PCI to the proximal and mid RCA lesions with non-overlapping Resolute Onyx 4.0 x 26 mm (proximal - dilated to 4.4 mm) and 4.0 x 15 mm (distal - dilated to 4.2 mm) drug-eluting stents with 0% residual stenosis and TIMI-3 flow.  Has not been admitted or been in the ED in the last 6 months.   He presents today for a follow-up visit with a chief complaint of  Past Medical History:  Diagnosis Date  . Alcoholism ([place])   . Asthma   . Bipolar affective disorder ([place])   . Chronic combined systolic and diastolic CHF (congestive heart failure) ([place])    a. 08/2016 Echo: EF 40-45%, mild AS, mild to mod MR, mildly dil LA/RA, mild-mod TR; b. 07/2017 Echo: EF 40-45%, mod LVH, Gr1 DD, mild to mod AS, mildly dil LA, nl RV fxn; c. 03/2019 Echo: EF 45-50%, AS (not severe). Mod dil PA.  . CKD (chronic kidney disease), stage III   . COPD (chronic obstructive pulmonary disease) ([place])   . Coronary artery disease    a. 01/2004 s/p PCI and Taxus DES to dRCA (3.5 x 12 mm); b. 07/2017 Lexiscan MV: no ischemia. Sm area of apicl thinning, likely attenuation. EF 33% (GI uptake noted)-->Low risk; c. 03/2019 Inf STEMI/PCI: LM nl, LAD min irregs, D1 20ost, RI 20ost, LCX nl, RCA 90p/1m (4.0x26 Resolute Onyx  DES), 52m (4.0x15 Resolute Onyx DES), 10d ISR.  Marland Kitchen Degenerative joint disease   . Essential hypertension   . Hyperlipidemia   . Hypertension   . Ischemic cardiomyopathy    a. 08/2016 Echo: EF 40-45%; b. 07/2017 Echo: EF 40-45%; c. 03/2019 Echo: EF 45-50%.  . Prostate CA ([place])    prostate ca dx 20 yrs ago  . PVD (peripheral vascular disease) ([place])   . Tobacco abuse    Past Surgical History:  Procedure Laterality Date  . CARDIAC CATHETERIZATION    . CORONARY ANGIOGRAPHY N/A 04/01/2019   Procedure: CORONARY ANGIOGRAPHY;  Surgeon: Nelva Bush, MD;  Location: [facility];  Service: Cardiovascular;  Laterality: N/A;  . CORONARY ANGIOPLASTY WITH STENT PLACEMENT    . CORONARY/GRAFT ACUTE MI REVASCULARIZATION N/A 04/01/2019   Procedure: Coronary/Graft Acute MI Revascularization;  Surgeon: Nelva Bush, MD;  Location: [facility];  Service: Cardiovascular;  Laterality: N/A;  . TOTAL HIP ARTHROPLASTY     right   Family History  Problem Relation Age of Onset  . Hypertension Mother   . Hyperlipidemia Mother   . Heart attack Mother   . Hypertension Father   . Hyperlipidemia Father   . Heart attack Father   . Prostate cancer Neg Hx   . Bladder Cancer Neg Hx   . Kidney cancer Neg Hx    Social History   Tobacco Use  . Smoking status: Former Smoker  Packs/day: 1.00    Years: 50.00    Pack years: 50.00    Types: Cigarettes  . Smokeless tobacco: Never Used  Substance Use Topics  . Alcohol use: Yes    Comment: social    Allergies  Allergen Reactions  . Tramadol Nausea Only  . Amlodipine Other (See Comments)    Constipation  Constipation Constipation  . Benazepril-Hydrochlorothiazide Other (See Comments)    Constipation  Constipation Constipation  . Chlorthalidone Other (See Comments)    Hyponatremia Hyponatremia  . Lisinopril Other (See Comments)  . Other Other (See Comments)    "ANY BLOOD PRESSURE MEDICATIONS THAT I'VE TRIED" - PT. DOES NOT  REMEMBER WHICH ONES "ANY BLOOD PRESSURE MEDICATIONS THAT I'VE TRIED"- PT. DOES NOT REMEMBER WHICH ONES- CONSTIPATION    Review of Systems  Constitutional: Negative for appetite change and fatigue.  HENT: Positive for congestion. Negative for postnasal drip and sore throat.   Eyes: Negative.   Respiratory: Positive for shortness of breath (with minimal exertion). Negative for cough.   Cardiovascular: Negative for chest pain, palpitations and leg swelling.  Gastrointestinal: Negative for abdominal distention and abdominal pain.  Endocrine: Negative.   Genitourinary: Negative.   Musculoskeletal: Positive for arthralgias (knee pain). Negative for back pain.  Skin: Negative.   Allergic/Immunologic: Negative.   Neurological: Negative for dizziness and light-headedness.  Hematological: Negative for adenopathy. Does not bruise/bleed easily.  Psychiatric/Behavioral: Negative for sleep disturbance (1 pillow, does not wear CPAP but has one).    Physical Exam Vitals signs and nursing note reviewed.  Constitutional:      Appearance: He is well-developed.  HENT:     Head: Normocephalic and atraumatic.  Neck:     Musculoskeletal: Neck supple.     Vascular: No JVD.  Cardiovascular:     Rate and Rhythm: Normal rate and regular rhythm.  Pulmonary:     Effort: Pulmonary effort is normal. No respiratory distress.     Breath sounds: Examination of the right-lower field reveals rhonchi. Examination of the left-lower field reveals rhonchi. Wheezing and rhonchi present.  Abdominal:     Palpations: Abdomen is soft.     Tenderness: There is no abdominal tenderness.  Musculoskeletal:     Right lower leg: He exhibits no tenderness. No edema.     Left lower leg: He exhibits no tenderness. No edema.  Skin:    General: Skin is warm and dry.  Neurological:     General: No focal deficit present.     Mental Status: He is alert and oriented to person, place, and time.  Psychiatric:        Mood and  Affect: Mood normal.        Behavior: Behavior normal.     Assessment & Plan:  1.Chronic heart failure with mildly reduced ejection fraction- - NYHA class III - euvolemic today  - weighing daily and reminded to call for an overnight weight gain of >2 pounds or a weekly weight gain of >5 pounds - weight 305 piounds from last visit here 1 month ago - had telemedicine visit with cardiology Fletcher Anon) 04/10/2019 - not adding salt and is eating frozen meals. Discussed 2,000mg  sodium diet. He is on the weight list for meals on wheels.  - EF >40% so would not qualify for entresto - BNP 04/12/2019 was 182.0  2: HTN- - BP  - saw PCP [name]) a couple days ago - BMP 04/14/2019 reviewed and showed sodium 136, potassium 4.3. creatinine 2.49 and GFR 27  Medication bottles reviewed.

## 2019-11-24 ENCOUNTER — Telehealth: Payer: Medicare HMO | Admitting: Family

## 2019-11-26 ENCOUNTER — Other Ambulatory Visit: Payer: Self-pay

## 2019-11-26 MED ORDER — ATORVASTATIN CALCIUM 40 MG PO TABS: 40.0000 mg | ORAL_TABLET | Freq: Every day | ORAL | 0 refills | Status: DC

## 2019-11-28 ENCOUNTER — Other Ambulatory Visit: Payer: Self-pay

## 2019-11-28 ENCOUNTER — Ambulatory Visit: Payer: Medicare HMO | Attending: Family | Admitting: Family

## 2019-11-28 ENCOUNTER — Encounter: Payer: Self-pay | Admitting: Family

## 2019-11-28 ENCOUNTER — Telehealth: Payer: Self-pay

## 2019-11-28 VITALS — BP 146/87 | HR 61 | Resp 16 | Ht 67.0 in | Wt 294.8 lb

## 2019-11-28 DIAGNOSIS — I739 Peripheral vascular disease, unspecified: Secondary | ICD-10-CM | POA: Diagnosis not present

## 2019-11-28 DIAGNOSIS — M25562 Pain in left knee: Secondary | ICD-10-CM | POA: Diagnosis not present

## 2019-11-28 DIAGNOSIS — I2510 Atherosclerotic heart disease of native coronary artery without angina pectoris: Secondary | ICD-10-CM | POA: Insufficient documentation

## 2019-11-28 DIAGNOSIS — I252 Old myocardial infarction: Secondary | ICD-10-CM | POA: Diagnosis not present

## 2019-11-28 DIAGNOSIS — Z87891 Personal history of nicotine dependence: Secondary | ICD-10-CM | POA: Diagnosis not present

## 2019-11-28 DIAGNOSIS — I255 Ischemic cardiomyopathy: Secondary | ICD-10-CM | POA: Diagnosis not present

## 2019-11-28 DIAGNOSIS — M25561 Pain in right knee: Secondary | ICD-10-CM | POA: Insufficient documentation

## 2019-11-28 DIAGNOSIS — I130 Hypertensive heart and chronic kidney disease with heart failure and stage 1 through stage 4 chronic kidney disease, or unspecified chronic kidney disease: Secondary | ICD-10-CM | POA: Insufficient documentation

## 2019-11-28 DIAGNOSIS — E785 Hyperlipidemia, unspecified: Secondary | ICD-10-CM | POA: Diagnosis not present

## 2019-11-28 DIAGNOSIS — Z885 Allergy status to narcotic agent status: Secondary | ICD-10-CM | POA: Insufficient documentation

## 2019-11-28 DIAGNOSIS — Z7951 Long term (current) use of inhaled steroids: Secondary | ICD-10-CM | POA: Diagnosis not present

## 2019-11-28 DIAGNOSIS — Z888 Allergy status to other drugs, medicaments and biological substances status: Secondary | ICD-10-CM | POA: Diagnosis not present

## 2019-11-28 DIAGNOSIS — J449 Chronic obstructive pulmonary disease, unspecified: Secondary | ICD-10-CM | POA: Diagnosis not present

## 2019-11-28 DIAGNOSIS — Z8546 Personal history of malignant neoplasm of prostate: Secondary | ICD-10-CM | POA: Insufficient documentation

## 2019-11-28 DIAGNOSIS — R0981 Nasal congestion: Secondary | ICD-10-CM | POA: Insufficient documentation

## 2019-11-28 DIAGNOSIS — Z7982 Long term (current) use of aspirin: Secondary | ICD-10-CM | POA: Insufficient documentation

## 2019-11-28 DIAGNOSIS — F319 Bipolar disorder, unspecified: Secondary | ICD-10-CM | POA: Diagnosis not present

## 2019-11-28 DIAGNOSIS — Z8249 Family history of ischemic heart disease and other diseases of the circulatory system: Secondary | ICD-10-CM | POA: Insufficient documentation

## 2019-11-28 DIAGNOSIS — I10 Essential (primary) hypertension: Secondary | ICD-10-CM

## 2019-11-28 DIAGNOSIS — Z79899 Other long term (current) drug therapy: Secondary | ICD-10-CM | POA: Insufficient documentation

## 2019-11-28 DIAGNOSIS — I5042 Chronic combined systolic (congestive) and diastolic (congestive) heart failure: Secondary | ICD-10-CM | POA: Diagnosis present

## 2019-11-28 DIAGNOSIS — J45909 Unspecified asthma, uncomplicated: Secondary | ICD-10-CM | POA: Insufficient documentation

## 2019-11-28 DIAGNOSIS — N183 Chronic kidney disease, stage 3 unspecified: Secondary | ICD-10-CM | POA: Insufficient documentation

## 2019-11-28 DIAGNOSIS — I5022 Chronic systolic (congestive) heart failure: Secondary | ICD-10-CM

## 2019-11-28 DIAGNOSIS — Z955 Presence of coronary angioplasty implant and graft: Secondary | ICD-10-CM | POA: Diagnosis not present

## 2019-11-28 MED ORDER — ISOSORBIDE MONONITRATE ER 30 MG PO TB24: 30.0000 mg | ORAL_TABLET | Freq: Every day | ORAL | 0 refills | Status: DC

## 2019-11-28 NOTE — Patient Instructions (Addendum)
Continue weighing daily and call for an overnight weight gain of > 2 pounds or a weekly weight gain of >5 pounds.  Call us at anytime to make a return appointment.

## 2019-11-28 NOTE — Telephone Encounter (Signed)
Requested Prescriptions   Signed Prescriptions Disp Refills  . isosorbide mononitrate (IMDUR) 30 MG 24 hr tablet 30 tablet 0    Sig: Take 1 tablet (30 mg total) by mouth daily.    Authorizing Provider: Kathlyn Sacramento A    Ordering User: Raelene Bott, Antwone Capozzoli L

## 2019-11-28 NOTE — Progress Notes (Signed)
Patient ID: [name], male    DOB: 13-Aug-1935, 84 y.o.   MRN: [id number]  HPI  Mr Mullarkey is a 84 y/o male with a history of CAD, asthma, hyperlipidemia, HTN, CKD, COPD, alcoholism, PVD, bipolar, former tobacco use and chronic heart failure.   Echo report from 04/01/2019 reviewed and showed an EF of 45-50% along with moderately dilated pulmonary artery.   Catheterization done 04/01/2019 showed: 1. Severe single-vessel coronary artery disease with sequential 80-95% proximal and mid RCA stenoses with heavy thrombus causing inferior STEMI. 2. Mild, non-obstructive coronary artery disease involving the left coronary artery. 3. Patent distal RCA Taxus stent with mild in-stent restenosis. 4. Challenging but ultimately successful PCI to the proximal and mid RCA lesions with non-overlapping Resolute Onyx 4.0 x 26 mm (proximal - dilated to 4.4 mm) and 4.0 x 15 mm (distal - dilated to 4.2 mm) drug-eluting stents with 0% residual stenosis and TIMI-3 flow.  Has not been admitted or been in the ED in the last 6 months.   He presents today for a follow-up visit with a chief complaint of minimal shortness of breath upon moderate exertion. He describes this as chronic in nature having been present for several years. He has associated nasal congestion and bilateral knee pain along with this. He denies any difficulty sleeping, abdominal distention, palpitations, pedal edema, chest pain, dizziness, cough, fatigue or weight gain.   Past Medical History:  Diagnosis Date  . Alcoholism ([place])   . Asthma   . Bipolar affective disorder ([place])   . Chronic combined systolic and diastolic CHF (congestive heart failure) ([place])    a. 08/2016 Echo: EF 40-45%, mild AS, mild to mod MR, mildly dil LA/RA, mild-mod TR; b. 07/2017 Echo: EF 40-45%, mod LVH, Gr1 DD, mild to mod AS, mildly dil LA, nl RV fxn; c. 03/2019 Echo: EF 45-50%, AS (not severe). Mod dil PA.  . CKD (chronic kidney disease), stage III   . COPD (chronic  obstructive pulmonary disease) ([place])   . Coronary artery disease    a. 01/2004 s/p PCI and Taxus DES to dRCA (3.5 x 12 mm); b. 07/2017 Lexiscan MV: no ischemia. Sm area of apicl thinning, likely attenuation. EF 33% (GI uptake noted)-->Low risk; c. 03/2019 Inf STEMI/PCI: LM nl, LAD min irregs, D1 20ost, RI 20ost, LCX nl, RCA 90p/69m (4.0x26 Resolute Onyx DES), 7m (4.0x15 Resolute Onyx DES), 10d ISR.  Marland Kitchen Degenerative joint disease   . Essential hypertension   . Hyperlipidemia   . Hypertension   . Ischemic cardiomyopathy    a. 08/2016 Echo: EF 40-45%; b. 07/2017 Echo: EF 40-45%; c. 03/2019 Echo: EF 45-50%.  . Prostate CA ([place])    prostate ca dx 20 yrs ago  . PVD (peripheral vascular disease) ([place])   . Tobacco abuse    Past Surgical History:  Procedure Laterality Date  . CARDIAC CATHETERIZATION    . CORONARY ANGIOGRAPHY N/A 04/01/2019   Procedure: CORONARY ANGIOGRAPHY;  Surgeon: Nelva Bush, MD;  Location: [facility];  Service: Cardiovascular;  Laterality: N/A;  . CORONARY ANGIOPLASTY WITH STENT PLACEMENT    . CORONARY/GRAFT ACUTE MI REVASCULARIZATION N/A 04/01/2019   Procedure: Coronary/Graft Acute MI Revascularization;  Surgeon: Nelva Bush, MD;  Location: [facility];  Service: Cardiovascular;  Laterality: N/A;  . TOTAL HIP ARTHROPLASTY     right   Family History  Problem Relation Age of Onset  . Hypertension Mother   . Hyperlipidemia Mother   . Heart attack Mother   .  Hypertension Father   . Hyperlipidemia Father   . Heart attack Father   . Prostate cancer Neg Hx   . Bladder Cancer Neg Hx   . Kidney cancer Neg Hx    Social History   Tobacco Use  . Smoking status: Former Smoker    Packs/day: 1.00    Years: 50.00    Pack years: 50.00    Types: Cigarettes  . Smokeless tobacco: Never Used  Substance Use Topics  . Alcohol use: Yes    Comment: social    Allergies  Allergen Reactions  . Tramadol Nausea Only  . Amlodipine Other (See Comments)     Constipation  Constipation Constipation  . Benazepril-Hydrochlorothiazide Other (See Comments)    Constipation  Constipation Constipation  . Chlorthalidone Other (See Comments)    Hyponatremia Hyponatremia  . Lisinopril Other (See Comments)  . Other Other (See Comments)    "ANY BLOOD PRESSURE MEDICATIONS THAT I'VE TRIED" - PT. DOES NOT REMEMBER WHICH ONES "ANY BLOOD PRESSURE MEDICATIONS THAT I'VE TRIED"- PT. DOES NOT REMEMBER WHICH ONES- CONSTIPATION   Prior to Admission medications   Medication Sig Start Date End Date Taking? Authorizing Provider  albuterol (VENTOLIN HFA) 108 (90 Base) MCG/ACT inhaler USE 2 PUFFS EVERY 4 HOURS AS NEEDED 10/14/19  Yes Darylene Price A, FNP  amLODipine (NORVASC) 5 MG tablet Take 0.5 tablets (2.5 mg total) by mouth daily. 04/15/19  Yes Fritzi Mandes, MD  aspirin EC 81 MG tablet Take 1 tablet (81 mg total) by mouth daily. 04/10/19  Yes [name], MD  atorvastatin (LIPITOR) 40 MG tablet Take 1 tablet (40 mg total) by mouth daily. *NEEDS OFFICE VISIT FOR FURTHER REFILLS* 11/26/19  Yes [name], MD  carvedilol (COREG) 6.25 MG tablet Take 1 tablet (6.25 mg total) by mouth 2 (two) times daily. 10/15/19  Yes Darylene Price A, FNP  clopidogrel (PLAVIX) 75 MG tablet Take 1 tablet (75 mg total) by mouth daily. 10/28/19  Yes [name], MD  furosemide (LASIX) 40 MG tablet Take 1 tablet (40 mg total) by mouth daily. And additional 40mg  in the afternoon as needed 10/14/19  Yes Darylene Price A, FNP  HYDROcodone-acetaminophen (NORCO/VICODIN) 5-325 MG tablet Take 1 tablet by mouth every 6 (six) hours as needed for moderate pain.   Yes [provider]  losartan (COZAAR) 25 MG tablet TAKE ONE-HALF TABLET BY MOUTH DAILY 10/14/19  Yes Chason Mciver A, FNP  mometasone-formoterol (DULERA) 200-5 MCG/ACT AERO Inhale 2 puffs into the lungs 2 (two) times daily. 04/15/19  Yes Fritzi Mandes, MD  nitroGLYCERIN (NITROSTAT) 0.4 MG SL tablet Take 0.4 mg by mouth  every 5 (five) minutes x 3 doses as needed for chest pain. As needed for chest pain 12/06/15  Yes [provider]  isosorbide mononitrate (IMDUR) 30 MG 24 hr tablet Take 1 tablet (30 mg total) by mouth daily. 11/28/19   [name], MD    Review of Systems  Constitutional: Negative for appetite change and fatigue.  HENT: Positive for congestion. Negative for postnasal drip and sore throat.   Eyes: Negative.   Respiratory: Positive for shortness of breath. Negative for cough.   Cardiovascular: Negative for chest pain, palpitations and leg swelling.  Gastrointestinal: Negative for abdominal distention and abdominal pain.  Endocrine: Negative.   Genitourinary: Negative.   Musculoskeletal: Positive for arthralgias (knee pain). Negative for back pain.  Skin: Negative.   Allergic/Immunologic: Negative.   Neurological: Negative for dizziness and light-headedness.  Hematological: Negative for adenopathy.  Does not bruise/bleed easily.  Psychiatric/Behavioral: Negative for sleep disturbance (1 pillow, does not wear CPAP but has one).   Vitals:   11/28/19 1355  BP: (!) 146/87  Pulse: 61  Resp: 16  SpO2: 97%  Weight: 294 lb 12.8 oz (133.7 kg)  Height: 5\' 7"  (1.702 m)   Wt Readings from Last 3 Encounters:  11/28/19 294 lb 12.8 oz (133.7 kg)  10/15/19 (!) 305 lb (138.3 kg)  09/04/19 (!) 310 lb 4 oz (140.7 kg)   . Lab Results  Component Value Date   CREATININE 2.49 (H) 04/14/2019   CREATININE 2.27 (H) 04/13/2019   CREATININE 2.03 (H) 04/12/2019    Physical Exam Vitals and nursing note reviewed.  Constitutional:      Appearance: He is well-developed.  HENT:     Head: Normocephalic and atraumatic.  Neck:     Vascular: No JVD.  Cardiovascular:     Rate and Rhythm: Normal rate and regular rhythm.  Pulmonary:     Effort: Pulmonary effort is normal. No respiratory distress.     Breath sounds: No wheezing or rhonchi.  Abdominal:     Palpations: Abdomen is soft.      Tenderness: There is no abdominal tenderness.  Musculoskeletal:     Cervical back: Neck supple.     Right lower leg: No tenderness. No edema.     Left lower leg: No tenderness. No edema.  Skin:    General: Skin is warm and dry.  Neurological:     General: No focal deficit present.     Mental Status: He is alert and oriented to person, place, and time.  Psychiatric:        Mood and Affect: Mood normal.        Behavior: Behavior normal.     Assessment & Plan:  1.Chronic heart failure with mildly reduced ejection fraction- - NYHA class II - euvolemic today  - weighing daily and reminded to call for an overnight weight gain of >2 pounds or a weekly weight gain of >5 pounds - weight down ~ 11 pounds from last visit here 6 weeks ago - had telemedicine visit with cardiology Fletcher Anon) 04/10/2019 - not adding salt and is eating frozen meals. Discussed 2,000mg  sodium diet. He is on the weight list for meals on wheels.  - EF >40% so would not qualify for entresto - BNP 04/12/2019 was 182.0 - patient says that he's received his flu vaccine for this season  2: HTN- - BP looks good today - saw PCP [name]) a couple months ago - BMP 04/14/2019 reviewed and showed sodium 136, potassium 4.3. creatinine 2.49 and GFR 27    Medication bottles reviewed.  Will not make a return appointment for patient at this time. Advised patient to call back at anytime to schedule another appointment. Patient was comfortable with this plan.

## 2019-12-05 ENCOUNTER — Encounter: Payer: Self-pay | Admitting: Cardiovascular Disease

## 2019-12-05 ENCOUNTER — Ambulatory Visit (INDEPENDENT_AMBULATORY_CARE_PROVIDER_SITE_OTHER): Payer: Medicare HMO | Admitting: Cardiovascular Disease

## 2019-12-05 ENCOUNTER — Other Ambulatory Visit: Payer: Self-pay

## 2019-12-05 VITALS — BP 154/60 | HR 64 | Ht 67.0 in | Wt 293.0 lb

## 2019-12-05 DIAGNOSIS — I5023 Acute on chronic systolic (congestive) heart failure: Secondary | ICD-10-CM | POA: Diagnosis not present

## 2019-12-05 DIAGNOSIS — E7849 Other hyperlipidemia: Secondary | ICD-10-CM | POA: Diagnosis not present

## 2019-12-05 DIAGNOSIS — I10 Essential (primary) hypertension: Secondary | ICD-10-CM

## 2019-12-05 DIAGNOSIS — I2510 Atherosclerotic heart disease of native coronary artery without angina pectoris: Secondary | ICD-10-CM

## 2019-12-05 DIAGNOSIS — I5022 Chronic systolic (congestive) heart failure: Secondary | ICD-10-CM | POA: Diagnosis not present

## 2019-12-05 MED ORDER — AMLODIPINE BESYLATE 5 MG PO TABS: 5.0000 mg | ORAL_TABLET | Freq: Every day | ORAL | 3 refills | Status: DC

## 2019-12-05 NOTE — Patient Instructions (Signed)
Medication Instructions:  Your physician has recommended you make the following change in your medication:  1. INCREASE Amlodipine 5 mg once daily   *If you need a refill on your cardiac medications before your next appointment, please call your pharmacy*  Lab Work: CBC & BMP today.  If you have labs (blood work) drawn today and your tests are completely normal, you will receive your results only by: Marland Kitchen MyChart Message (if you have MyChart) OR . A paper copy in the mail If you have any lab test that is abnormal or we need to change your treatment, we will call you to review the results.  Testing/Procedures: None  Follow-Up: At [facility], you and your health needs are our priority.  As part of our continuing mission to provide you with exceptional heart care, we have created designated Provider Care Teams.  These Care Teams include your primary Cardiologist (physician) and Advanced Practice Providers (APPs -  Physician Assistants and Nurse Practitioners) who all work together to provide you with the care you need, when you need it.  Your next appointment:   6 month(s)  The format for your next appointment:   In Person  Provider:    You may see Kathlyn Sacramento, MD or one of the following Advanced Practice Providers on your designated Care Team:    [place] Hodgkins, NP  Christell Faith, PA-C  Marrianne Mood, PA-C

## 2019-12-05 NOTE — Progress Notes (Signed)
Cardiology Office Note   Date:  12/05/2019   ID:  [name], DOB Feb 09, 1935, MRN [id number]  PCP:  Nicholas Merles, MD  Cardiologist:   Nicholas Sacramento, MD   Chief Complaint  Patient presents with  . Other    Patient c/o some SOB. Med reviewed verbally with patient.       History of Present Illness: [name] is a 84 y.o. male who presents for a follow-up visit regarding coronary artery disease and chronic systolic heart failure. He is status post Taxus drug-eluting stent placement to the distal right coronary artery in 2005.  He has extensive medical problems that include borderline diabetes, hypertension, hyperlipidemia, chronic systolic/diastolic heart failure , sleep apnea not on CPAP, morbid obesity, previous tobacco use,  stage III CKD, bipolar disorder and peripheral arterial disease. Echocardiogram in August, 2018 showed an EF of 40-45%, mild to moderate aortic stenosis, mildly dilated right and left atrium. He was hospitalized in April 2020 with inferior ST elevation myocardial infarction.  Cardiac catheterization showed 90% proximal and mid RCA disease which was treated with non-overlapping drug-eluting stents.  The distal RCA stent was patent with mild in-stent restenosis.  Echocardiogram showed an EF of 45 to 50% with severe inferior wall hypokinesis. He was rehospitalized shortly after with heart failure and volume overload.  He improved with diuresis and has been stable since then.  He has not had any other hospitalizations and has been following up with the heart failure clinic.  He is doing well with no recent chest pain or worsening dyspnea.  His weight has gradually decreased and currently he has no leg edema.  Past Medical History:  Diagnosis Date  . Alcoholism ([place])   . Asthma   . Bipolar affective disorder ([place])   . Chronic combined systolic and diastolic CHF (congestive heart failure) ([place])    a. 08/2016 Echo: EF 40-45%, mild AS, mild to mod MR, mildly  dil LA/RA, mild-mod TR; b. 07/2017 Echo: EF 40-45%, mod LVH, Gr1 DD, mild to mod AS, mildly dil LA, nl RV fxn; c. 03/2019 Echo: EF 45-50%, AS (not severe). Mod dil PA.  . CKD (chronic kidney disease), stage III   . COPD (chronic obstructive pulmonary disease) ([place])   . Coronary artery disease    a. 01/2004 s/p PCI and Taxus DES to dRCA (3.5 x 12 mm); b. 07/2017 Lexiscan MV: no ischemia. Sm area of apicl thinning, likely attenuation. EF 33% (GI uptake noted)-->Low risk; c. 03/2019 Inf STEMI/PCI: LM nl, LAD min irregs, D1 20ost, RI 20ost, LCX nl, RCA 90p/47m (4.0x26 Resolute Onyx DES), 70m (4.0x15 Resolute Onyx DES), 10d ISR.  Marland Kitchen Degenerative joint disease   . Essential hypertension   . Hyperlipidemia   . Hypertension   . Ischemic cardiomyopathy    a. 08/2016 Echo: EF 40-45%; b. 07/2017 Echo: EF 40-45%; c. 03/2019 Echo: EF 45-50%.  . Prostate CA ([place])    prostate ca dx 20 yrs ago  . PVD (peripheral vascular disease) ([place])   . Tobacco abuse     Past Surgical History:  Procedure Laterality Date  . CARDIAC CATHETERIZATION    . CORONARY ANGIOGRAPHY N/A 04/01/2019   Procedure: CORONARY ANGIOGRAPHY;  Surgeon: Nelva Bush, MD;  Location: [facility];  Service: Cardiovascular;  Laterality: N/A;  . CORONARY ANGIOPLASTY WITH STENT PLACEMENT    . CORONARY/GRAFT ACUTE MI REVASCULARIZATION N/A 04/01/2019   Procedure: Coronary/Graft Acute MI Revascularization;  Surgeon: Nelva Bush, MD;  Location: [facility];  Service: Cardiovascular;  Laterality: N/A;  . TOTAL HIP ARTHROPLASTY     right     Current Outpatient Medications  Medication Sig Dispense Refill  . albuterol (VENTOLIN HFA) 108 (90 Base) MCG/ACT inhaler USE 2 PUFFS EVERY 4 HOURS AS NEEDED 18 g 5  . amLODipine (NORVASC) 5 MG tablet Take 0.5 tablets (2.5 mg total) by mouth daily. 30 tablet 1  . aspirin EC 81 MG tablet Take 1 tablet (81 mg total) by mouth daily. 30 tablet 11  . atorvastatin (LIPITOR) 40 MG tablet Take 1  tablet (40 mg total) by mouth daily. *NEEDS OFFICE VISIT FOR FURTHER REFILLS* 30 tablet 0  . azelastine (ASTELIN) 0.1 % nasal spray Place 2 sprays into both nostrils 2 (two) times daily. Use in each nostril as directed    . carvedilol (COREG) 6.25 MG tablet Take 1 tablet (6.25 mg total) by mouth 2 (two) times daily. 180 tablet 3  . clopidogrel (PLAVIX) 75 MG tablet Take 1 tablet (75 mg total) by mouth daily. 30 tablet 0  . fluticasone (FLONASE) 50 MCG/ACT nasal spray Place 2 sprays into both nostrils daily.    . furosemide (LASIX) 40 MG tablet Take 1 tablet (40 mg total) by mouth daily. And additional 40mg  in the afternoon as needed 40 tablet 5  . HYDROcodone-acetaminophen (NORCO/VICODIN) 5-325 MG tablet Take 1 tablet by mouth every 6 (six) hours as needed for moderate pain.    . isosorbide mononitrate (IMDUR) 30 MG 24 hr tablet Take 1 tablet (30 mg total) by mouth daily. 30 tablet 0  . losartan (COZAAR) 25 MG tablet TAKE ONE-HALF TABLET BY MOUTH DAILY 15 tablet 5  . mometasone-formoterol (DULERA) 200-5 MCG/ACT AERO Inhale 2 puffs into the lungs 2 (two) times daily. 1 Inhaler 0  . nitroGLYCERIN (NITROSTAT) 0.4 MG SL tablet Take 0.4 mg by mouth every 5 (five) minutes x 3 doses as needed for chest pain. As needed for chest pain    . pantoprazole (PROTONIX) 40 MG tablet Take 40 mg by mouth every morning.    . traMADol (ULTRAM) 50 MG tablet Take 50 mg by mouth every 6 (six) hours as needed.     No current facility-administered medications for this visit.    Allergies:   Tramadol, Amlodipine, Benazepril-hydrochlorothiazide, Chlorthalidone, Lisinopril, and Other    Social History:  The patient  reports that he has quit smoking. His smoking use included cigarettes. He has a 50.00 pack-year smoking history. He has never used smokeless tobacco. He reports current alcohol use. He reports that he does not use drugs.   Family History:  The patient's family history includes Heart attack in his father and  mother; Hyperlipidemia in his father and mother; Hypertension in his father and mother.    ROS:  Please see the history of present illness.   Otherwise, review of systems are positive for none.   All other systems are reviewed and negative.    PHYSICAL EXAM: VS:  BP (!) 154/60 (BP Location: Left Arm, Patient Position: Sitting, Cuff Size: Normal)   Pulse 64   Ht 5\' 7"  (1.702 m)   Wt 293 lb (132.9 kg)   SpO2 97%   BMI 45.89 kg/m  , BMI Body mass index is 45.89 kg/m. GEN: Well nourished, well developed, in no acute distress  HEENT: normal  Neck: no carotid bruits, or masses.  Jugular venous pressure is not well visualized Cardiac: RRR; 2 out of 6 systolic murmur at the base, rubs, or  gallops, trace bilateral leg edema  Respiratory:  clear to auscultation bilaterally, normal work of breathing GI: soft, nontender, nondistended, + BS MS: no deformity or atrophy  Skin: warm and dry, no rash Neuro:  Strength and sensation are intact Psych: euthymic mood, full affect   EKG:  EKG is ordered today. The ekg ordered today demonstrates sinus rhythm with sinus arrhythmia, moderate LVH with nonspecific T wave changes.   Recent Labs: 04/02/2019: ALT 15 04/12/2019: B Natriuretic Peptide 182.0 04/14/2019: BUN 33; Creatinine, Ser 2.49; Hemoglobin 10.0; Platelets 252; Potassium 4.3; Sodium 136    Lipid Panel    Component Value Date/Time   CHOL 104 04/01/2019 0431   CHOL 189 04/26/2016 0810   CHOL 138 05/31/2014 0416   TRIG 30 04/01/2019 0431   TRIG 75 05/31/2014 0416   HDL 43 04/01/2019 0431   HDL 66 04/26/2016 0810   HDL 40 05/31/2014 0416   CHOLHDL 2.4 04/01/2019 0431   VLDL 6 04/01/2019 0431   VLDL 15 05/31/2014 0416   LDLCALC 55 04/01/2019 0431   LDLCALC 97 04/26/2016 0810   LDLCALC 83 05/31/2014 0416      Wt Readings from Last 3 Encounters:  12/05/19 293 lb (132.9 kg)  11/28/19 294 lb 12.8 oz (133.7 kg)  10/15/19 (!) 305 lb (138.3 kg)       No flowsheet data  found.    ASSESSMENT AND PLAN:  1.  Coronary artery disease involving native coronary arteries without angina: He is overall doing well with no anginal symptoms.  Continue medical therapy.  Continue dual antiplatelet therapy at least until April 2021.  However, given multiple RCA stents, it might make sense to continue dual antiplatelet therapy long-term if tolerated.    2.  Chronic systolic heart failure due to ischemic cardiomyopathy: Mildly reduced ejection fraction.  Continue carvedilol and losartan.  He appears to be euvolemic on current dose of furosemide.  I requested routine labs today including CBC and basic metabolic profile given that he has underlying chronic kidney disease.    3.  Hyperlipidemia: Continue atorvastatin with a target LDL of less than 70.  Lipid profile in April showed an LDL of 55.  4. Essential hypertension: Blood pressure is elevated.  I elected to increase amlodipine to 5 mg daily.     Disposition:   FU with me in 6 months  Signed,  Nicholas Sacramento, MD  12/05/2019 3:06 PM    [facility]

## 2019-12-06 LAB — BASIC METABOLIC PANEL
BUN/Creatinine Ratio: 11 (ref 10–24)
BUN: 23 mg/dL (ref 8–27)
CO2: 20 mmol/L (ref 20–29)
Calcium: 8.3 mg/dL — ABNORMAL LOW (ref 8.6–10.2)
Chloride: 100 mmol/L (ref 96–106)
Creatinine, Ser: 2.04 mg/dL — ABNORMAL HIGH (ref 0.76–1.27)
GFR calc Af Amer: 34 mL/min/{1.73_m2} — ABNORMAL LOW (ref >59)
GFR calc non Af Amer: 29 mL/min/{1.73_m2} — ABNORMAL LOW (ref >59)
Glucose: 124 mg/dL — ABNORMAL HIGH (ref 65–99)
Potassium: 3.9 mmol/L (ref 3.5–5.2)
Sodium: 136 mmol/L (ref 134–144)

## 2019-12-06 LAB — CBC
Hematocrit: 34.5 % — ABNORMAL LOW (ref 37.5–51.0)
Hemoglobin: 11.5 g/dL — ABNORMAL LOW (ref 13.0–17.7)
MCH: 27.5 pg (ref 26.6–33.0)
MCHC: 33.3 g/dL (ref 31.5–35.7)
MCV: 83 fL (ref 79–97)
Platelets: 293 10*3/uL (ref 150–450)
RBC: 4.18 x10E6/uL (ref 4.14–5.80)
RDW: 13.9 % (ref 11.6–15.4)
WBC: 7.7 10*3/uL (ref 3.4–10.8)

## 2019-12-23 ENCOUNTER — Encounter: Payer: Self-pay | Admitting: Emergency Medicine

## 2019-12-23 ENCOUNTER — Other Ambulatory Visit: Payer: Self-pay

## 2019-12-23 ENCOUNTER — Emergency Department
Admission: EM | Admit: 2019-12-23 | Discharge: 2019-12-23 | Disposition: A | Discharge status: Home / Self Care | Payer: Medicare HMO | Attending: Student | Admitting: Student

## 2019-12-23 DIAGNOSIS — Z87891 Personal history of nicotine dependence: Secondary | ICD-10-CM | POA: Insufficient documentation

## 2019-12-23 DIAGNOSIS — I2510 Atherosclerotic heart disease of native coronary artery without angina pectoris: Secondary | ICD-10-CM | POA: Diagnosis not present

## 2019-12-23 DIAGNOSIS — I5042 Chronic combined systolic (congestive) and diastolic (congestive) heart failure: Secondary | ICD-10-CM | POA: Diagnosis not present

## 2019-12-23 DIAGNOSIS — Z8546 Personal history of malignant neoplasm of prostate: Secondary | ICD-10-CM | POA: Diagnosis not present

## 2019-12-23 DIAGNOSIS — J449 Chronic obstructive pulmonary disease, unspecified: Secondary | ICD-10-CM | POA: Diagnosis not present

## 2019-12-23 DIAGNOSIS — J45909 Unspecified asthma, uncomplicated: Secondary | ICD-10-CM | POA: Diagnosis not present

## 2019-12-23 DIAGNOSIS — Z79899 Other long term (current) drug therapy: Secondary | ICD-10-CM | POA: Insufficient documentation

## 2019-12-23 DIAGNOSIS — Z7982 Long term (current) use of aspirin: Secondary | ICD-10-CM | POA: Insufficient documentation

## 2019-12-23 DIAGNOSIS — N183 Chronic kidney disease, stage 3 unspecified: Secondary | ICD-10-CM | POA: Insufficient documentation

## 2019-12-23 DIAGNOSIS — I130 Hypertensive heart and chronic kidney disease with heart failure and stage 1 through stage 4 chronic kidney disease, or unspecified chronic kidney disease: Secondary | ICD-10-CM | POA: Diagnosis not present

## 2019-12-23 DIAGNOSIS — K625 Hemorrhage of anus and rectum: Secondary | ICD-10-CM | POA: Insufficient documentation

## 2019-12-23 LAB — CBC
HCT: 36 % — ABNORMAL LOW (ref 39.0–52.0)
Hemoglobin: 11.7 g/dL — ABNORMAL LOW (ref 13.0–17.0)
MCH: 27.1 pg (ref 26.0–34.0)
MCHC: 32.5 g/dL (ref 30.0–36.0)
MCV: 83.5 fL (ref 80.0–100.0)
Platelets: 252 10*3/uL (ref 150–400)
RBC: 4.31 MIL/uL (ref 4.22–5.81)
RDW: 13.8 % (ref 11.5–15.5)
WBC: 8 10*3/uL (ref 4.0–10.5)
nRBC: 0 % (ref 0.0–0.2)

## 2019-12-23 LAB — COMPREHENSIVE METABOLIC PANEL
ALT: 11 U/L (ref 0–44)
AST: 16 U/L (ref 15–41)
Albumin: 3.4 g/dL — ABNORMAL LOW (ref 3.5–5.0)
Alkaline Phosphatase: 166 U/L — ABNORMAL HIGH (ref 38–126)
Anion gap: 10 (ref 5–15)
BUN: 23 mg/dL (ref 8–23)
CO2: 27 mmol/L (ref 22–32)
Calcium: 8.6 mg/dL — ABNORMAL LOW (ref 8.9–10.3)
Chloride: 98 mmol/L (ref 98–111)
Creatinine, Ser: 1.97 mg/dL — ABNORMAL HIGH (ref 0.61–1.24)
GFR calc Af Amer: 36 mL/min — ABNORMAL LOW (ref >60)
GFR calc non Af Amer: 31 mL/min — ABNORMAL LOW (ref >60)
Glucose, Bld: 97 mg/dL (ref 70–99)
Potassium: 3.8 mmol/L (ref 3.5–5.1)
Sodium: 135 mmol/L (ref 135–145)
Total Bilirubin: 0.6 mg/dL (ref 0.3–1.2)
Total Protein: 7.5 g/dL (ref 6.5–8.1)

## 2019-12-23 LAB — TYPE AND SCREEN
ABO/RH(D): O POS
Antibody Screen: NEGATIVE

## 2019-12-23 LAB — TROPONIN I (HIGH SENSITIVITY): Troponin I (High Sensitivity): 14 ng/L (ref <18)

## 2019-12-23 MED ORDER — FAMOTIDINE 20 MG PO TABS: 20.0000 mg | ORAL_TABLET | Freq: Two times a day (BID) | ORAL | 0 refills | Status: DC

## 2019-12-23 NOTE — ED Provider Notes (Signed)
[facility] Emergency Department Provider Note  ____________________________________________   First MD Initiated Contact with Patient 12/23/19 1846     (approximate)  I have reviewed the triage vital signs and the nursing notes.  History  Chief Complaint Rectal Bleeding    HPI [name] is a 85 y.o. male hx of HF on Plavix, CKD, CAD, COPD, hemorrhoids who presents for a singular episode of painless, bright red stool.  This occurred this morning when he used the restroom.  He noted stool mixed with bright red blood.  At the time had some lightheadedness, but none since. At present he denies any symptoms - no SOB, chest pain, lightheadedness, or dizziness.  He denies any nausea, vomiting, or bloody emesis.  He is on ASA and Plavix, no other blood thinners.   On chart review, he did have a colonoscopy in 2016, which noted internal hemorrhoids, diverticulosis, and one polyp.     Past Medical Hx Past Medical History:  Diagnosis Date  . Alcoholism ([place])   . Asthma   . Bipolar affective disorder ([place])   . Chronic combined systolic and diastolic CHF (congestive heart failure) ([place])    a. 08/2016 Echo: EF 40-45%, mild AS, mild to mod MR, mildly dil LA/RA, mild-mod TR; b. 07/2017 Echo: EF 40-45%, mod LVH, Gr1 DD, mild to mod AS, mildly dil LA, nl RV fxn; c. 03/2019 Echo: EF 45-50%, AS (not severe). Mod dil PA.  . CKD (chronic kidney disease), stage III   . COPD (chronic obstructive pulmonary disease) ([place])   . Coronary artery disease    a. 01/2004 s/p PCI and Taxus DES to dRCA (3.5 x 12 mm); b. 07/2017 Lexiscan MV: no ischemia. Sm area of apicl thinning, likely attenuation. EF 33% (GI uptake noted)-->Low risk; c. 03/2019 Inf STEMI/PCI: LM nl, LAD min irregs, D1 20ost, RI 20ost, LCX nl, RCA 90p/35m (4.0x26 Resolute Onyx DES), 30m (4.0x15 Resolute Onyx DES), 10d ISR.  Marland Kitchen Degenerative joint disease   . Essential hypertension   . Hyperlipidemia   . Hypertension   .  Ischemic cardiomyopathy    a. 08/2016 Echo: EF 40-45%; b. 07/2017 Echo: EF 40-45%; c. 03/2019 Echo: EF 45-50%.  . Prostate CA ([place])    prostate ca dx 20 yrs ago  . PVD (peripheral vascular disease) ([place])   . Tobacco abuse     Problem List Patient Active Problem List   Diagnosis Date Noted  . Acute respiratory failure with hypoxia ([place]) 04/12/2019  . STEMI (ST elevation myocardial infarction) ([place]) 04/01/2019  . STEMI involving right coronary artery ([place]) 04/01/2019  . Acute ST elevation myocardial infarction (STEMI) ([place]) 04/01/2019  . Lower extremity pain, bilateral 05/15/2018  . Elevated troponin I level 07/31/2017  . Hyperkalemia 05/28/2017  . Syncope 05/27/2017  . Meningioma ([place]) 01/05/2017  . GI bleed 12/01/2016  . Alcohol abuse 10/30/2016  . Acute on chronic systolic heart failure ([place]) 10/04/2016  . COPD (chronic obstructive pulmonary disease) with chronic bronchitis ([place]) 10/04/2016  . Obstructive sleep apnea 10/04/2016  . Hyponatremia 08/31/2016  . UTI (lower urinary tract infection) 08/31/2016  . Shortness of breath 02/11/2016  . Hyperlipidemia 12/03/2015  . Essential hypertension   . Coronary artery disease 01/19/2004    Past Surgical Hx Past Surgical History:  Procedure Laterality Date  . CARDIAC CATHETERIZATION    . CORONARY ANGIOGRAPHY N/A 04/01/2019   Procedure: CORONARY ANGIOGRAPHY;  Surgeon: Nelva Bush, MD;  Location: [facility];  Service: Cardiovascular;  Laterality:  N/A;  . CORONARY ANGIOPLASTY WITH STENT PLACEMENT    . CORONARY/GRAFT ACUTE MI REVASCULARIZATION N/A 04/01/2019   Procedure: Coronary/Graft Acute MI Revascularization;  Surgeon: Nelva Bush, MD;  Location: [facility];  Service: Cardiovascular;  Laterality: N/A;  . TOTAL HIP ARTHROPLASTY     right    Medications Prior to Admission medications   Medication Sig Start Date End Date Taking? Authorizing Provider  albuterol (VENTOLIN HFA) 108 (90 Base) MCG/ACT inhaler  USE 2 PUFFS EVERY 4 HOURS AS NEEDED 10/14/19   Darylene Price A, FNP  amLODipine (NORVASC) 5 MG tablet Take 1 tablet (5 mg total) by mouth daily. 12/05/19   [name], MD  aspirin EC 81 MG tablet Take 1 tablet (81 mg total) by mouth daily. 04/10/19   [name], MD  atorvastatin (LIPITOR) 40 MG tablet Take 1 tablet (40 mg total) by mouth daily. *NEEDS OFFICE VISIT FOR FURTHER REFILLS* 11/26/19   [name], MD  azelastine (ASTELIN) 0.1 % nasal spray Place 2 sprays into both nostrils 2 (two) times daily. Use in each nostril as directed    [provider]  carvedilol (COREG) 6.25 MG tablet Take 1 tablet (6.25 mg total) by mouth 2 (two) times daily. 10/15/19   Alisa Graff, FNP  clopidogrel (PLAVIX) 75 MG tablet Take 1 tablet (75 mg total) by mouth daily. 10/28/19   [name], MD  fluticasone (FLONASE) 50 MCG/ACT nasal spray Place 2 sprays into both nostrils daily.    [provider]  furosemide (LASIX) 40 MG tablet Take 1 tablet (40 mg total) by mouth daily. And additional 40mg  in the afternoon as needed 10/14/19   Alisa Graff, FNP  HYDROcodone-acetaminophen (NORCO/VICODIN) 5-325 MG tablet Take 1 tablet by mouth every 6 (six) hours as needed for moderate pain.    [provider]  isosorbide mononitrate (IMDUR) 30 MG 24 hr tablet Take 1 tablet (30 mg total) by mouth daily. 11/28/19   [name], MD  losartan (COZAAR) 25 MG tablet TAKE ONE-HALF TABLET BY MOUTH DAILY 10/14/19   Darylene Price A, FNP  mometasone-formoterol (DULERA) 200-5 MCG/ACT AERO Inhale 2 puffs into the lungs 2 (two) times daily. 04/15/19   Fritzi Mandes, MD  nitroGLYCERIN (NITROSTAT) 0.4 MG SL tablet Take 0.4 mg by mouth every 5 (five) minutes x 3 doses as needed for chest pain. As needed for chest pain 12/06/15   [provider]  pantoprazole (PROTONIX) 40 MG tablet Take 40 mg by mouth every morning.    [provider]  traMADol (ULTRAM) 50 MG tablet  Take 50 mg by mouth every 6 (six) hours as needed.    [provider]    Allergies Chlorthalidone, Lisinopril, Other, Amlodipine, Benazepril-hydrochlorothiazide, and Tramadol  Family Hx Family History  Problem Relation Age of Onset  . Hypertension Mother   . Hyperlipidemia Mother   . Heart attack Mother   . Hypertension Father   . Hyperlipidemia Father   . Heart attack Father   . Prostate cancer Neg Hx   . Bladder Cancer Neg Hx   . Kidney cancer Neg Hx     Social Hx Social History   Tobacco Use  . Smoking status: Former Smoker    Packs/day: 1.00    Years: 50.00    Pack years: 50.00    Types: Cigarettes  . Smokeless tobacco: Never Used  Substance Use Topics  . Alcohol use: Yes  . Drug use: No  Review of Systems  Constitutional: Negative for fever, chills. Eyes: Negative for visual changes. ENT: Negative for sore throat. Cardiovascular: Negative for chest pain. Respiratory: Negative for shortness of breath. Gastrointestinal: Negative for nausea, vomiting. +BRBPR Genitourinary: Negative for dysuria. Musculoskeletal: Negative for leg swelling. Skin: Negative for rash. Neurological: Negative for for headaches.   Physical Exam  Vital Signs: ED Triage Vitals  Enc Vitals Group     BP 12/23/19 1621 124/74     Pulse Rate 12/23/19 1621 68     Resp 12/23/19 1621 20     Temp 12/23/19 1621 98 F (36.7 C)     Temp Source 12/23/19 1621 Oral     SpO2 12/23/19 1621 98 %     Weight 12/23/19 1622 286 lb (129.7 kg)     Height 12/23/19 1622 5\' 7"  (1.702 m)     Head Circumference --      Peak Flow --      Pain Score 12/23/19 1637 0     Pain Loc --      Pain Edu? --      Excl. in [place]? --     Constitutional: Alert and oriented.  Head: Normocephalic. Atraumatic. Eyes: Conjunctivae clear. Sclera anicteric. Nose: No congestion. No rhinorrhea. Mouth/Throat: Wearing mask.  Neck: No stridor.   Cardiovascular: Normal rate, regular rhythm. Extremities well  perfused. Respiratory: Normal respiratory effort.   Gastrointestinal: Soft. Obese. Rectal: Chaperone present. No external hemorrhoids. Dark stool mixed w/ bright red blood, guaiac positive.  Musculoskeletal: No lower extremity edema. No deformities. Neurologic:  Normal speech and language. No gross focal neurologic deficits are appreciated.  Skin: Skin is warm, dry and intact. No rash noted. Psychiatric: Mood and affect are appropriate for situation.  EKG  Personally reviewed.   Rate: 67 Rhythm: snius Axis: normal Intervals: WNL PVC Sinus pause Non specific T wave changes, no acute ischemic changes No STEMI    Radiology  N/A   Procedures  Procedure(s) performed (including critical care):  Procedures   Initial Impression / Assessment and Plan / ED Course  85 y.o. male who presents to the ED for an episode of BRBPR  Ddx: internal hemorrhoid, diverticular bleed, lower GI bleed, constipation  On exam, no obvious external hemorrhoids, rectal exam reveals brown stool mixed with bright blood, guaiac positive.  Hemoglobin 11.7, stable and actually slightly improved from 2 weeks ago.  Creatinine slightly elevated, but again improved from prior.  Normal BUN.  Troponin negative.  Given labs are stable, with no further bleeding episodes aside from this morning, patient is stable for discharge with outpatient GI follow-up.  Given referral.  Will start on famotidine, however higher likelihood of lower GI bleed based on history and exam.  Given return precautions.  Patient comfortable with plan.   Final Clinical Impression(s) / ED Diagnosis  Final diagnoses:  Rectal bleeding       Note:  This document was prepared using Dragon voice recognition software and may include unintentional dictation errors.   Lilia Pro., MD 12/24/19 [phone]

## 2019-12-23 NOTE — ED Triage Notes (Signed)
Pt in via POV, sent over from PCP office due to noticing bright red blood in stool this morning.  Per reports, Hgb 10.  Pt reports some dizziness and nausea as well.  Vitals WDL, NAD noted at this time.

## 2019-12-23 NOTE — ED Notes (Signed)
EDP at bedside  

## 2019-12-23 NOTE — ED Notes (Signed)
Full rainbow and type and screen drawn.

## 2019-12-23 NOTE — ED Notes (Signed)
Pt angry that he is cold. Pt yelling at this RN about the room being cold. Offered blanket. Pt remains voicing how mad he is.

## 2019-12-23 NOTE — ED Notes (Signed)
Verbal order from EDP to not draw second troponin.

## 2019-12-23 NOTE — ED Triage Notes (Signed)
FIRST NURSE NOTE - SENT FROM [facility] FOR RECTAL BLEEDING.  HGB 10 PER REPORT.

## 2019-12-23 NOTE — Discharge Instructions (Addendum)
Thank you for letting us take care of you in the emergency department today.   Please continue to take any regular, prescribed medications.   New medications we have prescribed:  - Famotidine, stomach protecting medication  Please follow up with: - Your primary care doctor to review your ER visit and follow up on your symptoms.  - GI doctor, referral and information for one is below  Please return to the ER for any new or worsening symptoms.

## 2019-12-25 ENCOUNTER — Ambulatory Visit: Payer: Medicare HMO | Admitting: Gastroenterology

## 2019-12-25 ENCOUNTER — Telehealth: Payer: Self-pay

## 2019-12-25 ENCOUNTER — Encounter: Payer: Self-pay | Admitting: Gastroenterology

## 2019-12-25 ENCOUNTER — Other Ambulatory Visit: Payer: Self-pay

## 2019-12-25 VITALS — BP 159/67 | HR 75 | Temp 98.1°F | Wt 289.0 lb

## 2019-12-25 DIAGNOSIS — K625 Hemorrhage of anus and rectum: Secondary | ICD-10-CM | POA: Diagnosis not present

## 2019-12-25 NOTE — Telephone Encounter (Signed)
   [facility] Pre-operative Risk Assessment    Request for surgical clearance:  1. What type of surgery is being performed? Colonoscopy   2. When is this surgery scheduled? TBD   3. What type of clearance is required (medical clearance vs. Pharmacy clearance to hold med vs. Both)? Both  4. Are there any medications that need to be held prior to surgery and how long? Plavix 75 mg, 5 day hold   5. Practice name and name of physician performing surgery? [facility],  Jonathon Bellows, MD   6. What is your office phone number [phone]    7.   What is your office fax number [phone]  8.   Anesthesia type (None, local, MAC, general) ? General   [name] 12/25/2019, 4:05 PM  _________________________________________________________________   (provider comments below)

## 2019-12-25 NOTE — Progress Notes (Signed)
Jonathon Bellows MD, MRCP(U.K) [address]  [place], [place] [address]  Main: [phone]  Fax: [phone]   Gastroenterology Consultation  Referring Provider:     Marguerita Merles, MD Primary Care Physician:  Marguerita Merles, MD Primary Gastroenterologist:  Dr. Jonathon Bellows  Reason for Consultation:    Rectal bleeding        HPI:   [name] is a 85 y.o. y/o male referred for consultation & management  by Dr. [name], Connye Burkitt, MD.  He has previously been under the care of [facility].  Been evaluated for incomplete rectal evacuation and fecal smearing.  Anorectal manometry demonstrated a weak internal anal sphincter pressure normal external anal sphincter pressure.  Able to expel defecation balloon.  Was advised to undergo pelvic floor retraining.  Per epic last colonoscopy was in 2016 found to have grade 1 internal hemorrhoids diverticulosis of the colon.  He presented to the emergency room on January 2021 while on Plavix for a single episode of painless bright red stool.  The stool was mixed with bright red blood.  Was also on aspirin at the point of time.  Advised to follow-up with GI.   He says he just had one episode of rectal bleeding none since.  Suffers from constipation.  Has a hard bowel movement.  No other complaints such as perianal itching or rectal seepage.  Past Medical History:  Diagnosis Date  . Alcoholism ([place])   . Asthma   . Bipolar affective disorder ([place])   . Chronic combined systolic and diastolic CHF (congestive heart failure) ([place])    a. 08/2016 Echo: EF 40-45%, mild AS, mild to mod MR, mildly dil LA/RA, mild-mod TR; b. 07/2017 Echo: EF 40-45%, mod LVH, Gr1 DD, mild to mod AS, mildly dil LA, nl RV fxn; c. 03/2019 Echo: EF 45-50%, AS (not severe). Mod dil PA.  . CKD (chronic kidney disease), stage III   . COPD (chronic obstructive pulmonary disease) ([place])   . Coronary artery disease    a. 01/2004 s/p PCI and Taxus DES to dRCA (3.5 x 12 mm); b. 07/2017  Lexiscan MV: no ischemia. Sm area of apicl thinning, likely attenuation. EF 33% (GI uptake noted)-->Low risk; c. 03/2019 Inf STEMI/PCI: LM nl, LAD min irregs, D1 20ost, RI 20ost, LCX nl, RCA 90p/29m (4.0x26 Resolute Onyx DES), 39m (4.0x15 Resolute Onyx DES), 10d ISR.  Marland Kitchen Degenerative joint disease   . Essential hypertension   . Hyperlipidemia   . Hypertension   . Ischemic cardiomyopathy    a. 08/2016 Echo: EF 40-45%; b. 07/2017 Echo: EF 40-45%; c. 03/2019 Echo: EF 45-50%.  . Prostate CA ([place])    prostate ca dx 20 yrs ago  . PVD (peripheral vascular disease) ([place])   . Tobacco abuse     Past Surgical History:  Procedure Laterality Date  . CARDIAC CATHETERIZATION    . CORONARY ANGIOGRAPHY N/A 04/01/2019   Procedure: CORONARY ANGIOGRAPHY;  Surgeon: Nelva Bush, MD;  Location: [facility];  Service: Cardiovascular;  Laterality: N/A;  . CORONARY ANGIOPLASTY WITH STENT PLACEMENT    . CORONARY/GRAFT ACUTE MI REVASCULARIZATION N/A 04/01/2019   Procedure: Coronary/Graft Acute MI Revascularization;  Surgeon: Nelva Bush, MD;  Location: [facility];  Service: Cardiovascular;  Laterality: N/A;  . TOTAL HIP ARTHROPLASTY     right    Prior to Admission medications   Medication Sig Start Date End Date Taking? Authorizing Provider  albuterol (VENTOLIN HFA) 108 (90  Base) MCG/ACT inhaler USE 2 PUFFS EVERY 4 HOURS AS NEEDED 10/14/19   Darylene Price A, FNP  amLODipine (NORVASC) 5 MG tablet Take 1 tablet (5 mg total) by mouth daily. 12/05/19   [name], MD  aspirin EC 81 MG tablet Take 1 tablet (81 mg total) by mouth daily. 04/10/19   [name], MD  atorvastatin (LIPITOR) 40 MG tablet Take 1 tablet (40 mg total) by mouth daily. *NEEDS OFFICE VISIT FOR FURTHER REFILLS* 11/26/19   [name], MD  azelastine (ASTELIN) 0.1 % nasal spray Place 2 sprays into both nostrils 2 (two) times daily. Use in each nostril as directed    [provider]  carvedilol  (COREG) 6.25 MG tablet Take 1 tablet (6.25 mg total) by mouth 2 (two) times daily. 10/15/19   Alisa Graff, FNP  clopidogrel (PLAVIX) 75 MG tablet Take 1 tablet (75 mg total) by mouth daily. 10/28/19   [name], MD  famotidine (PEPCID) 20 MG tablet Take 1 tablet (20 mg total) by mouth 2 (two) times daily. 12/23/19 01/22/20  Lilia Pro., MD  fluticasone (FLONASE) 50 MCG/ACT nasal spray Place 2 sprays into both nostrils daily.    [provider]  furosemide (LASIX) 40 MG tablet Take 1 tablet (40 mg total) by mouth daily. And additional 40mg  in the afternoon as needed 10/14/19   Alisa Graff, FNP  HYDROcodone-acetaminophen (NORCO/VICODIN) 5-325 MG tablet Take 1 tablet by mouth every 6 (six) hours as needed for moderate pain.    [provider]  isosorbide mononitrate (IMDUR) 30 MG 24 hr tablet Take 1 tablet (30 mg total) by mouth daily. 11/28/19   [name], MD  losartan (COZAAR) 25 MG tablet TAKE ONE-HALF TABLET BY MOUTH DAILY 10/14/19   Darylene Price A, FNP  mometasone-formoterol (DULERA) 200-5 MCG/ACT AERO Inhale 2 puffs into the lungs 2 (two) times daily. 04/15/19   Fritzi Mandes, MD  nitroGLYCERIN (NITROSTAT) 0.4 MG SL tablet Take 0.4 mg by mouth every 5 (five) minutes x 3 doses as needed for chest pain. As needed for chest pain 12/06/15   [provider]  pantoprazole (PROTONIX) 40 MG tablet Take 40 mg by mouth every morning.    [provider]  traMADol (ULTRAM) 50 MG tablet Take 50 mg by mouth every 6 (six) hours as needed.    [provider]    Family History  Problem Relation Age of Onset  . Hypertension Mother   . Hyperlipidemia Mother   . Heart attack Mother   . Hypertension Father   . Hyperlipidemia Father   . Heart attack Father   . Prostate cancer Neg Hx   . Bladder Cancer Neg Hx   . Kidney cancer Neg Hx      Social History   Tobacco Use  . Smoking status: Former Smoker    Packs/day: 1.00    Years: 50.00     Pack years: 50.00    Types: Cigarettes  . Smokeless tobacco: Never Used  Substance Use Topics  . Alcohol use: Yes  . Drug use: No    Allergies as of 12/25/2019 - Review Complete 12/23/2019  Allergen Reaction Noted  . Chlorthalidone Other (See Comments) 12/14/2015  . Lisinopril Other (See Comments) 04/24/2018  . Other Other (See Comments) 12/16/2015  . Amlodipine Other (See Comments) 12/03/2015  . Benazepril-hydrochlorothiazide Other (See Comments) 12/03/2015  . Tramadol Nausea Only 06/04/2019    Review of Systems:    All systems  reviewed and negative except where noted in HPI.   Physical Exam:  There were no vitals taken for this visit. No LMP for male patient. Psych:  Alert and cooperative. Normal mood and affect. General:   Alert,  Well-developed, well-nourished, pleasant and cooperative in NAD Head:  Normocephalic and atraumatic. Eyes:  Sclera clear, no icterus.   Conjunctiva pink. Lungs:  Respirations even and unlabored.  Clear throughout to auscultation.   No wheezes, crackles, or rhonchi. No acute distress. Heart:  Regular rate and rhythm; no murmurs, clicks, rubs, or gallops. Abdomen:  Normal bowel sounds.  No bruits.  Soft, non-tender and non-distended without masses, hepatosplenomegaly or hernias noted.  No guarding or rebound tenderness.    Neurologic:  Alert and oriented x3;  grossly normal neurologically. Skin:  Intact without significant lesions or rashes. No jaundice. Lymph Nodes:  No significant cervical adenopathy. Psych:  Alert and cooperative. Normal mood and affect.  Imaging Studies: No results found.  Assessment and Plan:   [name] is a 85 y.o. y/o male has been referred for rectal bleeding while on Plavix and aspirin.  He is not able to quantify the quantity of blood loss.  Discussed options of performing a colonoscopy which she would like to proceed with.  He is on aspirin and Plavix and will get Plavix holding instructions from his physician.  I  have discussed alternative options, risks & benefits,  which include, but are not limited to, bleeding, infection, perforation,respiratory complication & drug reaction.  The patient agrees with this plan & written consent will be obtained.     Follow up in PRN  Dr Jonathon Bellows MD,MRCP(U.K)

## 2019-12-25 NOTE — Telephone Encounter (Signed)
   Primary Cardiologist: Kathlyn Sacramento, MD  Chart reviewed as part of pre-operative protocol coverage. Patient has a history of CAD s/p STEMI 03/2019 with PCI/DES to RCA, recommended for uninterrupted DAPT for 1 year.   Pre-op covering staff: - Please contact requesting surgeon's office to clarify urgency of this procedure.   Abigail Butts, PA-C 12/25/2019, 4:24 PM

## 2019-12-26 NOTE — Telephone Encounter (Signed)
Left detailed message with [facility] and asked them to call back. Also sending this info to them via [place] fax.

## 2019-12-29 NOTE — Telephone Encounter (Signed)
[name] from [facility] returned call

## 2019-12-29 NOTE — Telephone Encounter (Signed)
Inform patient that we can still proceed with colonoscopy while on Plavix- if has any polyps will have to return again after April. Reason to perform is to rule out anything like cancer- if willing then schedule

## 2019-12-29 NOTE — Telephone Encounter (Signed)
[name] from [facility] left vm regarding having received a clearance request on pt. She states pt has a Stint placed in April 2020 and it is recommend  to NOT hold for 1 year please call her regarding this and to clarify if this is Urgent colonoscopy or routine [phone]

## 2019-12-30 ENCOUNTER — Other Ambulatory Visit: Payer: Self-pay | Admitting: Cardiovascular Disease

## 2019-12-30 NOTE — Telephone Encounter (Signed)
I will fax clearance to [facility]. I will remove from the pre op call back pool.

## 2019-12-30 NOTE — Telephone Encounter (Addendum)
   Primary Cardiologist: Kathlyn Sacramento, MD  Chart reviewed as part of pre-operative protocol coverage. Patient has a history of CAD s/p multiple stenting, chronic systolic CHF, PAD, hypertension, hyperlipidemia, diabetes, sleep apnea, morbid obesity, CKD stage III. He most recently had an inferior STEMI in 03/2019 and was treated with DES x2 to RCA. Echo at that time showed LVEF of 45-50%. Patient was recently seen by Dr. Fletcher Anon on 12/05/2019 and was doing well from a cardiac standpoint at that time. I called patient today and he reports no changes since visit with Dr. Fletcher Anon and states he is doing well from a heart standpoint. He has chronic stable shortness of breath with activity. No chest pain, acute CHF symptoms, palpitations, or syncope. He states he is not very active but that this is due to knee and hip pain and not due to chest pain or shortness of breath. Patient was recently seen in the ED for bright red blood stools. GI is planning for colonoscopy to rule out malignancy. Difficult to assess whether patient is truly able to complete >4 METs due to knee and hip pain; however, a colonoscopy is a  low risk procedure and given patient is stable from a cardiac standpoint and we need to rule out malignancy, patient would be at acceptable risk for the planned procedure without further cardiovascular testing.   However, patient should remain on Plavix and Aspirin. Uninterrupted dual antiplatelet recommended until 03/31/2020 (1 year out from STEMI with stenting). Dr. Vicente Males said he is OK with proceeding with colonoscopy while on Plavix.   I will route this recommendation to the requesting party via Epic fax function and remove from pre-op pool.  Please call with questions.  Darreld Mclean, PA-C 12/30/2019, 10:30 AM

## 2019-12-31 ENCOUNTER — Other Ambulatory Visit: Payer: Self-pay

## 2019-12-31 ENCOUNTER — Telehealth: Payer: Self-pay

## 2019-12-31 DIAGNOSIS — K625 Hemorrhage of anus and rectum: Secondary | ICD-10-CM

## 2019-12-31 MED ORDER — NA SULFATE-K SULFATE-MG SULF 17.5-3.13-1.6 GM/177ML PO SOLN: 1.0000 | Freq: Once | ORAL | 0 refills | Status: AC

## 2019-12-31 NOTE — Telephone Encounter (Signed)
Spoke with pt daughter and was able to schedule pt's colonoscopy. I have informed pt's daughter we will send the bowel prep prescription to pt's preferred pharmacy.

## 2020-01-09 ENCOUNTER — Other Ambulatory Visit: Payer: Self-pay | Admitting: Family

## 2020-01-09 MED ORDER — LOSARTAN POTASSIUM 25 MG PO TABS: 12.5000 mg | ORAL_TABLET | Freq: Every day | ORAL | 5 refills | Status: DC

## 2020-01-09 MED ORDER — AMLODIPINE BESYLATE 5 MG PO TABS: 5.0000 mg | ORAL_TABLET | Freq: Every day | ORAL | 5 refills | Status: DC

## 2020-01-15 ENCOUNTER — Other Ambulatory Visit: Payer: Self-pay

## 2020-01-15 ENCOUNTER — Encounter: Payer: Self-pay | Admitting: Gastroenterology

## 2020-01-16 ENCOUNTER — Encounter: Payer: Self-pay | Admitting: Anesthesiology

## 2020-01-19 ENCOUNTER — Telehealth: Payer: Self-pay

## 2020-01-19 NOTE — Telephone Encounter (Signed)
Spoke with pt's daughter and informed her that the Endo anesthesia team has reviewed pt's medical history and has informed Dr. Vicente Males that pt is too high risk and recommend pt having procedure performed at either [facility] or [facility]. She understands and agrees to pt having procedure at [facility]. I informed her that we will send the referral and [facility] will contact her to schedule.

## 2020-01-23 ENCOUNTER — Ambulatory Visit: Admission: RE | Admit: 2020-01-23 | Payer: Medicare HMO | Source: Home / Self Care | Admitting: Gastroenterology

## 2020-01-23 HISTORY — DX: Dyspnea, unspecified: R06.00

## 2020-01-23 SURGERY — COLONOSCOPY WITH PROPOFOL
Anesthesia: Choice

## 2020-01-27 ENCOUNTER — Other Ambulatory Visit: Payer: Self-pay | Admitting: Cardiovascular Disease

## 2020-02-13 ENCOUNTER — Other Ambulatory Visit: Payer: Self-pay

## 2020-02-13 ENCOUNTER — Telehealth: Payer: Self-pay | Admitting: Cardiovascular Disease

## 2020-02-13 ENCOUNTER — Ambulatory Visit (INDEPENDENT_AMBULATORY_CARE_PROVIDER_SITE_OTHER): Payer: Medicare HMO | Admitting: Cardiology

## 2020-02-13 ENCOUNTER — Encounter: Payer: Self-pay | Admitting: Cardiology

## 2020-02-13 VITALS — BP 102/44 | HR 64 | Ht 67.0 in | Wt 280.0 lb

## 2020-02-13 DIAGNOSIS — I2510 Atherosclerotic heart disease of native coronary artery without angina pectoris: Secondary | ICD-10-CM | POA: Diagnosis not present

## 2020-02-13 DIAGNOSIS — I10 Essential (primary) hypertension: Secondary | ICD-10-CM

## 2020-02-13 DIAGNOSIS — R079 Chest pain, unspecified: Secondary | ICD-10-CM

## 2020-02-13 NOTE — Telephone Encounter (Signed)
Incoming call from [place], NP from [facility] who was following up with patient for knee pain.   NP communicated that patient has been having right and left upper chest pain that is intermittant since 2/16 when he had his second covid shot. Pt reports that SOB is assoc w cxp. She advised pt to seek urgent medical care and patient refused to go to urgent care or call EMS.  Made call to patient to discuss further.   Pt lives home alone and has not been exposed to anyone with Covid. He denies any fever or cough.  Currently pain is 3/10, pt took NTG while on the phone with NP.  Pt speaks in full sentences and no audible distress is noted. He again refused EMS or urgent medical care. He is agreeable to come for OV this afternoon.   He does not have any current VS readings.   Daughter, [name] is next of kin. I made call to daughter at the request of patient to make next available appt for OV.   Daughter agreed to bring in patient this afternoon to see DOD for current complaint. appt made for 3:20P. Advised pt to seek urgent medical care for any new or worsening sx.

## 2020-02-13 NOTE — Progress Notes (Signed)
Cardiology Office Note:    Date:  02/13/2020   ID:  [name], DOB 10-03-1935, MRN [id number]  PCP:  Marguerita Merles, MD  Cardiologist:  Kathlyn Sacramento, MD  Electrophysiologist:  None   Referring MD: Marguerita Merles, MD   Chief Complaint  Patient presents with  . office visit    Pt states having "these symptoms come and go" numbing feeling in head SOB/ chest pain/ dizziness/ fatigue. Meds verbally reviewed.    History of Present Illness:    [name] is a 85 y.o. male with a hx of CAD with DES to the distal RCA in 2005, heart failure reduced EF, last EF 45% who presents with chest pain.  She states having chest pain for the past 10 days to having his Covid vaccine.  Pain is not related with exertion, reproducible with palpation.  Pain is sometimes made worse with laying on his back.  He states feeling well since his last stent in April 2020.  Denies any weight gain, edema.  He was hospitalized in April 2020 with inferior ST elevation myocardial infarction.  Cardiac catheterization showed 90% proximal and mid RCA disease which was treated with non-overlapping drug-eluting stents.  The distal RCA stent was patent with mild in-stent restenosis  Past Medical History:  Diagnosis Date  . Alcoholism ([place])   . Asthma   . Bipolar affective disorder ([place])   . Chronic combined systolic and diastolic CHF (congestive heart failure) ([place])    a. 08/2016 Echo: EF 40-45%, mild AS, mild to mod MR, mildly dil LA/RA, mild-mod TR; b. 07/2017 Echo: EF 40-45%, mod LVH, Gr1 DD, mild to mod AS, mildly dil LA, nl RV fxn; c. 03/2019 Echo: EF 45-50%, AS (not severe). Mod dil PA.  . CKD (chronic kidney disease), stage III   . COPD (chronic obstructive pulmonary disease) ([place])   . Coronary artery disease    a. 01/2004 s/p PCI and Taxus DES to dRCA (3.5 x 12 mm); b. 07/2017 Lexiscan MV: no ischemia. Sm area of apicl thinning, likely attenuation. EF 33% (GI uptake noted)-->Low risk; c. 03/2019 Inf STEMI/PCI: LM nl,  LAD min irregs, D1 20ost, RI 20ost, LCX nl, RCA 90p/58m (4.0x26 Resolute Onyx DES), 22m (4.0x15 Resolute Onyx DES), 10d ISR.  Marland Kitchen Degenerative joint disease    knees and hip  . Dyspnea    on exertion  . Essential hypertension   . Hyperlipidemia   . Hypertension    controlled on meds  . Ischemic cardiomyopathy    a. 08/2016 Echo: EF 40-45%; b. 07/2017 Echo: EF 40-45%; c. 03/2019 Echo: EF 45-50%.  . Prostate CA ([place])    prostate ca dx 20 yrs ago  . PVD (peripheral vascular disease) ([place])   . Tobacco abuse     Past Surgical History:  Procedure Laterality Date  . CARDIAC CATHETERIZATION    . CORONARY ANGIOGRAPHY N/A 04/01/2019   Procedure: CORONARY ANGIOGRAPHY;  Surgeon: Nelva Bush, MD;  Location: [facility];  Service: Cardiovascular;  Laterality: N/A;  . CORONARY ANGIOPLASTY WITH STENT PLACEMENT     x2  . CORONARY/GRAFT ACUTE MI REVASCULARIZATION N/A 04/01/2019   Procedure: Coronary/Graft Acute MI Revascularization;  Surgeon: Nelva Bush, MD;  Location: [facility];  Service: Cardiovascular;  Laterality: N/A;  . TOTAL HIP ARTHROPLASTY     right    Current Medications: Current Meds  Medication Sig  . albuterol (VENTOLIN HFA) 108 (90 Base) MCG/ACT inhaler USE 2 PUFFS EVERY 4 HOURS  AS NEEDED  . amLODipine (NORVASC) 5 MG tablet Take 1 tablet (5 mg total) by mouth daily.  Marland Kitchen aspirin EC 81 MG tablet Take 1 tablet (81 mg total) by mouth daily.  Marland Kitchen atorvastatin (LIPITOR) 40 MG tablet TAKE 1 TABLET BY MOUTH DAILY  . azelastine (ASTELIN) 0.1 % nasal spray Place 2 sprays into both nostrils 2 (two) times daily. Use in each nostril as directed  . carvedilol (COREG) 6.25 MG tablet Take 1 tablet (6.25 mg total) by mouth 2 (two) times daily.  . clopidogrel (PLAVIX) 75 MG tablet Take 1 tablet (75 mg total) by mouth daily.  . fluticasone (FLONASE) 50 MCG/ACT nasal spray Place 2 sprays into both nostrils daily.  . furosemide (LASIX) 40 MG tablet Take 1 tablet (40 mg total) by  mouth daily. And additional 40mg  in the afternoon as needed  . HYDROcodone-acetaminophen (NORCO/VICODIN) 5-325 MG tablet Take 1 tablet by mouth every 6 (six) hours as needed for moderate pain.  . isosorbide mononitrate (IMDUR) 30 MG 24 hr tablet TAKE 1 TABLET BY MOUTH DAILY  . losartan (COZAAR) 25 MG tablet Take 0.5 tablets (12.5 mg total) by mouth daily.  . mometasone-formoterol (DULERA) 200-5 MCG/ACT AERO Inhale 2 puffs into the lungs 2 (two) times daily.  . nitroGLYCERIN (NITROSTAT) 0.4 MG SL tablet Take 0.4 mg by mouth every 5 (five) minutes x 3 doses as needed for chest pain. As needed for chest pain  . pantoprazole (PROTONIX) 40 MG tablet Take 40 mg by mouth every morning.  . traMADol (ULTRAM) 50 MG tablet Take 50 mg by mouth every 6 (six) hours as needed.     Allergies:   Chlorthalidone, Lisinopril, Other, Amlodipine, Benazepril-hydrochlorothiazide, and Tramadol   Social History   Socioeconomic History  . Marital status: Legally Separated    Spouse name: Not on file  . Number of children: Not on file  . Years of education: Not on file  . Highest education level: Not on file  Occupational History  . Occupation: retired  Tobacco Use  . Smoking status: Former Smoker    Packs/day: 1.00    Years: 50.00    Pack years: 50.00    Types: Cigarettes  . Smokeless tobacco: Never Used  Substance and Sexual Activity  . Alcohol use: Not Currently  . Drug use: No  . Sexual activity: Never  Other Topics Concern  . Not on file  Social History Narrative  . Not on file   Social Determinants of Health   Financial Resource Strain: Low Risk   . Difficulty of Paying Living Expenses: Not hard at all  Food Insecurity: No Food Insecurity  . Worried About Charity fundraiser in the Last Year: Never true  . Ran Out of Food in the Last Year: Never true  Transportation Needs: Unknown  . Lack of Transportation (Medical): Not on file  . Lack of Transportation (Non-Medical): No  Physical  Activity: Inactive  . Days of Exercise per Week: 0 days  . Minutes of Exercise per Session: 0 min  Stress: No Stress Concern Present  . Feeling of Stress : Only a little  Social Connections: Moderately Isolated  . Frequency of Communication with Friends and Family: Never  . Frequency of Social Gatherings with Friends and Family: Once a week  . Attends Religious Services: 1 to 4 times per year  . Active Member of Clubs or Organizations: No  . Attends Archivist Meetings: Never  . Marital Status: Separated  Family History: The patient's family history includes Heart attack in his father and mother; Hyperlipidemia in his father and mother; Hypertension in his father and mother. There is no history of Prostate cancer, Bladder Cancer, or Kidney cancer.  ROS:   Please see the history of present illness.     All other systems reviewed and are negative.  EKGs/Labs/Other Studies Reviewed:    The following studies were reviewed today:   EKG:  EKG is  ordered today.  The ekg ordered today demonstrates sinus rhythm PACs.  Recent Labs: 04/12/2019: B Natriuretic Peptide 182.0 12/23/2019: ALT 11; BUN 23; Creatinine, Ser 1.97; Hemoglobin 11.7; Platelets 252; Potassium 3.8; Sodium 135  Recent Lipid Panel    Component Value Date/Time   CHOL 104 04/01/2019 0431   CHOL 189 04/26/2016 0810   CHOL 138 05/31/2014 0416   TRIG 30 04/01/2019 0431   TRIG 75 05/31/2014 0416   HDL 43 04/01/2019 0431   HDL 66 04/26/2016 0810   HDL 40 05/31/2014 0416   CHOLHDL 2.4 04/01/2019 0431   VLDL 6 04/01/2019 0431   VLDL 15 05/31/2014 0416   LDLCALC 55 04/01/2019 0431   LDLCALC 97 04/26/2016 0810   LDLCALC 83 05/31/2014 0416    Physical Exam:    VS:  BP (!) 102/44 (BP Location: Left Arm, Patient Position: Sitting, Cuff Size: Large)   Pulse 64   Ht 5\' 7"  (1.702 m)   Wt 280 lb (127 kg)   SpO2 95%   BMI 43.85 kg/m     Wt Readings from Last 3 Encounters:  02/13/20 280 lb (127 kg)  12/25/19  289 lb (131.1 kg)  12/23/19 286 lb (129.7 kg)     GEN:  Well nourished, well developed in no acute distress HEENT: Normal NECK: No JVD; No carotid bruits LYMPHATICS: No lymphadenopathy CARDIAC: RRR, no murmurs, rubs, gallops RESPIRATORY:  Clear to auscultation without rales, wheezing or rhonchi  ABDOMEN: Soft, non-tender, distended MUSCULOSKELETAL:  No edema; chest wall tender with palpation. SKIN: Warm and dry NEUROLOGIC:  Alert and oriented x 3 PSYCHIATRIC:  Normal affect   ASSESSMENT:    1. Chest pain of uncertain etiology   2. Coronary artery disease involving native coronary artery of native heart without angina pectoris   3. Essential hypertension    PLAN:    In order of problems listed above:  1. Patient with 10-day history of chest pain after COVID-19 vaccine.  Chest pain is reproducible with palpation on the left upper and right upper chest.  Symptoms consistent with a musculoskeletal etiology.  He has an appointment to follow-up with PCP on Monday.  Consider management as per primary care provider. 2. History of CAD.  Continue antiplatelets and statin as prescribed. 3. BP on the low normal and.  Continue current meds as prescribed.  Usually runs in the 416 systolic at home.  Follow-up as scheduled with Dr. Fletcher Anon   Medication Adjustments/Labs and Tests Ordered: Current medicines are reviewed at length with the patient today.  Concerns regarding medicines are outlined above.  Orders Placed This Encounter  Procedures  . EKG 12-Lead   No orders of the defined types were placed in this encounter.   Patient Instructions  Medication Instructions:  Your physician recommends that you continue on your current medications as directed. Please refer to the Current Medication list given to you today.  *If you need a refill on your cardiac medications before your next appointment, please call your pharmacy*  Lab Work: none If you  have labs (blood work) drawn today and  your tests are completely normal, you will receive your results only by: Marland Kitchen MyChart Message (if you have MyChart) OR . A paper copy in the mail If you have any lab test that is abnormal or we need to change your treatment, we will call you to review the results.  Testing/Procedures: none  Follow-Up: At [facility], you and your health needs are our priority.  As part of our continuing mission to provide you with exceptional heart care, we have created designated Provider Care Teams.  These Care Teams include your primary Cardiologist (physician) and Advanced Practice Providers (APPs -  Physician Assistants and Nurse Practitioners) who all work together to provide you with the care you need, when you need it.  We recommend signing up for the patient portal called "MyChart".  Sign up information is provided on this After Visit Summary.  MyChart is used to connect with patients for Virtual Visits (Telemedicine).  Patients are able to view lab/test results, encounter notes, upcoming appointments, etc.  Non-urgent messages can be sent to your provider as well.   To learn more about what you can do with MyChart, go to [email or web address].    Your next appointment:   As scheduled   The format for your next appointment:   In Person  Provider:   Kathlyn Sacramento, MD     Signed, Kate Sable, MD  02/13/2020 5:26 PM    [place]

## 2020-02-13 NOTE — Patient Instructions (Signed)
Medication Instructions:  Your physician recommends that you continue on your current medications as directed. Please refer to the Current Medication list given to you today.  *If you need a refill on your cardiac medications before your next appointment, please call your pharmacy*  Lab Work: none If you have labs (blood work) drawn today and your tests are completely normal, you will receive your results only by: Marland Kitchen MyChart Message (if you have MyChart) OR . A paper copy in the mail If you have any lab test that is abnormal or we need to change your treatment, we will call you to review the results.  Testing/Procedures: none  Follow-Up: At [facility], you and your health needs are our priority.  As part of our continuing mission to provide you with exceptional heart care, we have created designated Provider Care Teams.  These Care Teams include your primary Cardiologist (physician) and Advanced Practice Providers (APPs -  Physician Assistants and Nurse Practitioners) who all work together to provide you with the care you need, when you need it.  We recommend signing up for the patient portal called "MyChart".  Sign up information is provided on this After Visit Summary.  MyChart is used to connect with patients for Virtual Visits (Telemedicine).  Patients are able to view lab/test results, encounter notes, upcoming appointments, etc.  Non-urgent messages can be sent to your provider as well.   To learn more about what you can do with MyChart, go to [email or web address].    Your next appointment:   As scheduled   The format for your next appointment:   In Person  Provider:   Kathlyn Sacramento, MD

## 2020-02-13 NOTE — Telephone Encounter (Signed)
Nurse states pt is complaining of Chest pain.  Pt c/o of Chest Pain: STAT if CP now or developed within 24 hours  1. Are you having CP right now? yes  2. Are you experiencing any other symptoms (ex. SOB, nausea, vomiting, sweating)? Sob, sweats  3. How long have you been experiencing CP? Since 16th   4. Is your CP continuous or coming and going? Comes and goes  5. Have you taken Nitroglycerin? No States pt has taken the COVID vaccine shot on 16th ?

## 2020-02-18 ENCOUNTER — Telehealth: Payer: Self-pay | Admitting: Cardiovascular Disease

## 2020-02-18 NOTE — Telephone Encounter (Signed)
Patient daughter states pt went to PCP on Monday and his BP was still low., it was 105/(pt daughter doesn't remember) Today 127/75. Please call to discuss. Denies any symptoms.

## 2020-02-18 NOTE — Telephone Encounter (Signed)
Spoke to patient. He reports that he has been feeling well and just wanted to call and make sure Dr. Fletcher Anon is wanting to continue meds as ordered.   BP soft at last OV appt. Low again at PCP on Monday.  BP improved today 127/75.   I asked patient to continue meds as ordered and continue taking BP readings.   He will make [place] aware if any lower readings occur or if new sx arise.   Advised pt to call for any further questions or concerns.

## 2020-02-19 NOTE — Telephone Encounter (Signed)
Stop losartan continue other medications.

## 2020-02-20 NOTE — Telephone Encounter (Signed)
Spoke to patient about POC update from Dr. Fletcher Anon.   Pt verbalized understanding to stop Losartan and continue taking daily BP readings.   No further questions or orders at this time.   Advised pt to call for any further questions or concerns.

## 2020-03-02 ENCOUNTER — Ambulatory Visit: Payer: Medicare HMO | Admitting: Licensed Clinical Social Worker

## 2020-03-08 ENCOUNTER — Other Ambulatory Visit: Payer: Self-pay | Admitting: Cardiovascular Disease

## 2020-03-09 ENCOUNTER — Other Ambulatory Visit: Payer: Self-pay

## 2020-03-09 ENCOUNTER — Ambulatory Visit (INDEPENDENT_AMBULATORY_CARE_PROVIDER_SITE_OTHER): Payer: Medicare HMO | Admitting: Licensed Clinical Social Worker

## 2020-03-09 ENCOUNTER — Encounter: Payer: Self-pay | Admitting: Licensed Clinical Social Worker

## 2020-03-09 DIAGNOSIS — G894 Chronic pain syndrome: Secondary | ICD-10-CM | POA: Diagnosis not present

## 2020-03-09 DIAGNOSIS — F0631 Mood disorder due to known physiological condition with depressive features: Secondary | ICD-10-CM
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
## 2020-03-26 ENCOUNTER — Ambulatory Visit: Payer: Medicare HMO | Admitting: Licensed Clinical Social Worker

## 2020-04-13 ENCOUNTER — Encounter: Payer: Self-pay | Admitting: Pulmonary Disease

## 2020-04-13 ENCOUNTER — Other Ambulatory Visit: Payer: Self-pay

## 2020-04-13 ENCOUNTER — Institutional Professional Consult (permissible substitution): Payer: Medicare HMO | Admitting: Pulmonary Disease

## 2020-04-13 ENCOUNTER — Telehealth: Payer: Self-pay | Admitting: Cardiovascular Disease

## 2020-04-13 ENCOUNTER — Ambulatory Visit: Payer: Medicare HMO | Admitting: Pulmonary Disease

## 2020-04-13 VITALS — BP 122/70 | HR 70 | Ht 67.0 in | Wt 278.0 lb

## 2020-04-13 DIAGNOSIS — G4733 Obstructive sleep apnea (adult) (pediatric): Secondary | ICD-10-CM | POA: Diagnosis not present

## 2020-04-13 DIAGNOSIS — J449 Chronic obstructive pulmonary disease, unspecified: Secondary | ICD-10-CM

## 2020-04-13 DIAGNOSIS — Z6841 Body Mass Index (BMI) 40.0 and over, adult: Secondary | ICD-10-CM

## 2020-04-13 DIAGNOSIS — G478 Other sleep disorders: Secondary | ICD-10-CM | POA: Diagnosis not present

## 2020-04-13 DIAGNOSIS — J310 Chronic rhinitis: Secondary | ICD-10-CM

## 2020-04-13 DIAGNOSIS — R0602 Shortness of breath: Secondary | ICD-10-CM | POA: Diagnosis not present

## 2020-04-13 NOTE — Telephone Encounter (Signed)
   Primary Cardiologist: Kathlyn [place], MD  Chart reviewed as part of pre-operative protocol coverage. Left Voice mail to call back.   Hx of CAD s/p DES to dRCA in 2005. Hx of inferior STEMI 03/2019. Cardiac catheterization showed 90% proximal and mid RCA disease which was treated with non-overlapping drug-eluting stents.  The distal RCA stent was patent with mild in-stent restenosis.  Per your last OV note 11/2019 "Continue dual antiplatelet therapy at least until April 2021.  However, given multiple RCA stents, it might make sense to continue dual antiplatelet therapy long-term if tolerated".  Dr. Fletcher Anon, can you recommendations regarding Plavix for colonoscopy and long term?     [place], [place] 04/13/2020, 2:10 PM

## 2020-04-13 NOTE — Patient Instructions (Signed)
I recommend some Zyrtec (cetirizine) at bedtime to see if this helps with your mucus  You will need another sleep study to reevaluate your sleep apnea, we will have you see Dr. Halford Chessman our sleep specialist after the test  Please see if you can move up your appointment with Dr. Pryor Ochoa (ENT)  Continue Trelegy, this is an excellent inhaler, make sure that you rinse your mouth well after you use it you may put a little amount of baking soda in the rinsing water to clear the remnants of the powder from the inhaler  I will see you in follow-up in 3 months time call sooner should any new difficulties arise

## 2020-04-13 NOTE — Telephone Encounter (Signed)
   [facility] Pre-operative Risk Assessment    Request for surgical clearance:  1. What type of surgery is being performed? Colonoscopy  2. When is this surgery scheduled? ASAP  3. What type of clearance is required (medical clearance vs. Pharmacy clearance to hold med vs. Both)? both  4. Are there any medications that need to be held prior to surgery and how long? Clopidogrel (Plavix) hold for 5 days   5. Practice name and name of physician performing surgery? [facility]  6. What is your office phone number [phone]   7.   What is your office fax number [phone]  8.   Anesthesia type (None, local, MAC, general) not listed    Ace Gins 04/13/2020, 10:17 AM  _________________________________________________________________   (provider comments below)

## 2020-04-13 NOTE — Progress Notes (Signed)
Subjective:    Patient ID: [name], male    DOB: 1935-05-18, 85 y.o.   MRN: [id number]  HPI The patient is an 84 year old former smoker (quit 5 years ago) who presents for a second opinion on the issue of COPD.  He is kindly referred by Dr. Delight Stare.  Patient had previously been followed by Dr. Wallene Huh.  He last saw Dr. Raul Del in 2019.  He notes that he has issues with dyspnea but his symptoms are mostly upper airway symptoms.  He states that "mucus" collects in the back of his throat from postnasal drip.  He also has constant nasal congestion.  He has had these problems for several years.  Of note, even on normal conversation he has snoring type sounds indicating upper airway resistance.  He is supposed to see Dr. Pryor Ochoa, ENT on June 4.  He notes that physical exertion worsens his symptoms he feels better when he is "resting".  I have reviewed his prior spirometry's from [place] [facility] this showed that at best he had mild obstructive lung disease.  His second spirometry was indicative of restriction likely due to obesity.  He apparently had a sleep study in 2017 at [facility], however, it appears that those results were never followed through.  The patient states he is currently not on any noninvasive ventilation device.  Apparently one had been provided for him previously through [facility].  However I cannot get the details of the type of machine or when was this prescribed.  He is somewhat of a rambling historian.  Daughter presents today with him but cannot add much more to the history.  He has had issues with orthopnea and paroxysmal nocturnal dyspnea, no issues with lower extremity edema or calf pain.  He has had chest pain which is ill characterized and feels more of a tightness and also notes some dysphagia and globus sensation.  He voices no other complaint.  Data from [facility]: PFT of 12/16/2015: SPIROMETRY: FVC was 2.39 liters, 87% of  predicted FEV1 was 1.75, 83% of predicted FEV1 ratio was 73 FEF 25-75% liters per second was 54% of predicted  LUNG VOLUMES: TLC was 80% of predicted RV was 80% of predicted  DIFFUSION CAPACITY: DLCO was 60% of predicted DLCO/VA was 119% of predicted  FLOW VOLUME LOOP: NORMAL  Impression Mild obstruction Lung volumes are in normal DLCO is moderately decreased, but corrects for VA   Electronically signed by Erby Pian, MD at 12/16/2015 4:43 PM EST   Spirometry from 08/02/2018: SPIROMETRY: FVC was 1.74 liters, 65% of predicted FEV1 was 1.34, 66% of predicted FEV1 ratio was 77 FEF 25-75% liters per second was 58% of predicted  FLOW VOLUME LOOP: ??   Impression Moderate restriction  *Compared to Previous Study, numbers are declined.  Also read by Dr. Raul Del.  Review of Systems A 10 point review of systems was performed and it is as noted above otherwise negative.  Current Outpatient Medications  Medication Instructions  . ADULT ASPIRIN REGIMEN 81 MG EC tablet TAKE 1 TABLET BY MOUTH DAILY  . albuterol (VENTOLIN HFA) 108 (90 Base) MCG/ACT inhaler USE 2 PUFFS EVERY 4 HOURS AS NEEDED  . amLODipine (NORVASC) 5 mg, Oral, Daily  . atorvastatin (LIPITOR) 40 MG tablet TAKE 1 TABLET BY MOUTH DAILY  . azelastine (ASTELIN) 0.1 % nasal spray 2 sprays, Each Nare, 2 times daily, Use in each nostril as directed   . carvedilol (COREG) 6.25 mg,  Oral, 2 times daily  . clopidogrel (PLAVIX) 75 mg, Oral, Daily  . famotidine (PEPCID) 20 mg, Oral, 2 times daily  . fluticasone (FLONASE) 50 MCG/ACT nasal spray 2 sprays, Each Nare, Daily  . furosemide (LASIX) 40 mg, Oral, Daily, And additional 40mg  in the afternoon as needed  . HYDROcodone-acetaminophen (NORCO/VICODIN) 5-325 MG tablet 1 tablet, Oral, Every 6 hours PRN  . isosorbide mononitrate (IMDUR) 30 MG 24 hr tablet TAKE 1 TABLET BY MOUTH DAILY  . losartan (COZAAR) 25 mg, Oral, Daily  . nitroGLYCERIN (NITROSTAT) 0.4 mg, Oral,  Every 5 min x3 PRN, As needed for chest pain  . pantoprazole (PROTONIX) 40 mg, Oral, BH-each morning  . traMADol (ULTRAM) 50 mg, Oral, Every 6 hours PRN  . TRELEGY ELLIPTA 200-62.5-25 MCG/INH AEPB 1 puff, Inhalation, Daily   Allergies  Allergen Reactions  . Chlorthalidone Other (See Comments)    Hyponatremia  . Lisinopril Other (See Comments)  . Other Other (See Comments)    "ANY BLOOD PRESSURE MEDICATIONS THAT I'VE TRIED" - PT. DOES NOT REMEMBER WHICH ONES "ANY BLOOD PRESSURE MEDICATIONS THAT I'VE TRIED"- PT. DOES NOT REMEMBER WHICH ONES- CONSTIPATION  . Amlodipine Other (See Comments)    Constipation  Constipation Constipation  . Benazepril-Hydrochlorothiazide Other (See Comments)    Constipation  . Tramadol Nausea Only   Past Medical History:  Diagnosis Date  . Alcoholism ([place])   . Asthma   . Bipolar affective disorder ([place])   . Chronic combined systolic and diastolic CHF (congestive heart failure) ([place])    a. 08/2016 Echo: EF 40-45%, mild AS, mild to mod MR, mildly dil LA/RA, mild-mod TR; b. 07/2017 Echo: EF 40-45%, mod LVH, Gr1 DD, mild to mod AS, mildly dil LA, nl RV fxn; c. 03/2019 Echo: EF 45-50%, AS (not severe). Mod dil PA.  . CKD (chronic kidney disease), stage III   . COPD (chronic obstructive pulmonary disease) ([place])   . Coronary artery disease    a. 01/2004 s/p PCI and Taxus DES to dRCA (3.5 x 12 mm); b. 07/2017 Lexiscan MV: no ischemia. Sm area of apicl thinning, likely attenuation. EF 33% (GI uptake noted)-->Low risk; c. 03/2019 Inf STEMI/PCI: LM nl, LAD min irregs, D1 20ost, RI 20ost, LCX nl, RCA 90p/23m (4.0x26 Resolute Onyx DES), 58m (4.0x15 Resolute Onyx DES), 10d ISR.  Marland Kitchen Degenerative joint disease    knees and hip  . Dyspnea    on exertion  . Essential hypertension   . Hyperlipidemia   . Hypertension    controlled on meds  . Ischemic cardiomyopathy    a. 08/2016 Echo: EF 40-45%; b. 07/2017 Echo: EF 40-45%; c. 03/2019 Echo: EF 45-50%.  . OSA (obstructive sleep  apnea)   . Prostate CA [facility])    prostate ca dx 20 yrs ago  . PVD (peripheral vascular disease) ([place])   . Tobacco abuse    Past Surgical History:  Procedure Laterality Date  . CARDIAC CATHETERIZATION    . CORONARY ANGIOGRAPHY N/A 04/01/2019   Procedure: CORONARY ANGIOGRAPHY;  Surgeon: Nelva Bush, MD;  Location: [facility];  Service: Cardiovascular;  Laterality: N/A;  . CORONARY ANGIOPLASTY WITH STENT PLACEMENT     x2  . CORONARY/GRAFT ACUTE MI REVASCULARIZATION N/A 04/01/2019   Procedure: Coronary/Graft Acute MI Revascularization;  Surgeon: Nelva Bush, MD;  Location: [facility];  Service: Cardiovascular;  Laterality: N/A;  . TOTAL HIP ARTHROPLASTY     right   Family History  Problem Relation Age of Onset  . Hypertension  Mother   . Hyperlipidemia Mother   . Heart attack Mother   . Hypertension Father   . Hyperlipidemia Father   . Heart attack Father   . Prostate cancer Neg Hx   . Bladder Cancer Neg Hx   . Kidney cancer Neg Hx    Social History   Socioeconomic History  . Marital status: Legally Separated    Spouse name: Not on file  . Number of children: Not on file  . Years of education: Not on file  . Highest education level: Not on file  Occupational History  . Occupation: retired  Tobacco Use  . Smoking status: Former Smoker    Packs/day: 1.00    Years: 50.00    Pack years: 50.00    Types: Cigarettes  . Smokeless tobacco: Never Used  Substance and Sexual Activity  . Alcohol use: Not Currently  . Drug use: No  . Sexual activity: Never  Other Topics Concern  . Not on file  Social History Narrative  . Not on file   Social Determinants of Health   Financial Resource Strain: Low Risk   . Difficulty of Paying Living Expenses: Not hard at all  Food Insecurity: No Food Insecurity  . Worried About Charity fundraiser in the Last Year: Never true  . Ran Out of Food in the Last Year: Never true  Transportation Needs: Unknown  . Lack  of Transportation (Medical): Not on file  . Lack of Transportation (Non-Medical): No  Physical Activity: Inactive  . Days of Exercise per Week: 0 days  . Minutes of Exercise per Session: 0 min  Stress: No Stress Concern Present  . Feeling of Stress : Only a little  Social Connections: Moderately Isolated  . Frequency of Communication with Friends and Family: Never  . Frequency of Social Gatherings with Friends and Family: Once a week  . Attends Religious Services: 1 to 4 times per year  . Active Member of Clubs or Organizations: No  . Attends Archivist Meetings: Never  . Marital Status: Separated  Intimate Partner Violence: Not At Risk  . Fear of Current or Ex-Partner: No  . Emotionally Abused: No  . Physically Abused: No  . Sexually Abused: No   He has resided previously in [place] in [place].  Has not traveled outside the country.  Has not been in the [employer]  Does not describe any prior occupational exposures.  Quit smoking 5 years ago.    Objective:   Physical Exam BP 122/70 (BP Location: Right Arm, Cuff Size: Large)   Pulse 70   Ht 5\' 7"  (1.702 m)   Wt 278 lb (126.1 kg)   SpO2 98%   BMI 43.54 kg/m  GENERAL: Morbidly obese male, no acute respiratory distress, he has significant upper airway noise.  Very nasal speech.  Presents in motorized scooter.   HEAD: Normocephalic, atraumatic.  EYES: Pupils equal, round, reactive to light.  No scleral icterus.  MOUTH: He has a very crowded airway, class IV Mallampati.  Nasal passages are edematous. NECK: Supple. No thyromegaly.  Thick neck.  Cannot assess JVD.  PULMONARY: Good air entry bilaterally.  Few rhonchi noted.  Otherwise no other adventitious sounds. CARDIOVASCULAR: S1 and S2. Regular rate and rhythm.  GASTROINTESTINAL: Protuberant abdomen, nondistended. MUSCULOSKELETAL: No joint deformity, no clubbing, no edema.  NEUROLOGIC: Awake, alert, speech is fluent.  No overt focal deficit. SKIN: Intact,warm,dry.   No rashes  PSYCH: Mood and behavior appropriate  Assessment & Plan:   .   ICD-10-CM   1. Shortness of breath  R06.02   2. UARS (upper airway resistance syndrome)  G47.8   3. Obstructive sleep apnea  G47.33 Split night study  4. COPD (chronic obstructive pulmonary disease) with chronic bronchitis ([place])  J44.9   5. PNAR (perennial non-allergic rhinitis)  J31.0   6. Morbid obesity with BMI of 40.0-44.9, adult (HCC)  E66.01    Z68.41     Discussion: The patient's shortness of breath is likely multifactorial.  I suspect that this is due to to significant upper airway resistance syndrome from a crowded airway and chronic nasal congestion as well as obesity with perhaps an element of obesity with alveolar hypoventilation.  He has clinically symptoms consistent with very severe obstructive sleep apnea and I suspect that he likely has a complex type of sleep apnea.  He will need a split-night sleep study to reassess this issue.  With regards to his COPD this appears to be mild in nature according to his prior PFTs.  He is on excellent medications for control namely Trelegy and as needed albuterol.  He has been asked to continue these medications and to rinse well after use.  As I do not treat complex sleep apnea we will have him see our colleague Dr. Chesley Mires once his split-night study is done for further therapy.  Evaluation by ENT will also be very helpful.  I have asked the patient's daughter to try to move up his ENT appointment with Dr. Pryor Ochoa.  Weight loss will be of great importance to manage his symptoms.  This was discussed with the patient and his daughter.  Patient also has a flare of his allergic rhinitis I have recommended some Zyrtec at bedtime for control of the symptoms.  He is already using nasal sprays.  We will see the patient in follow-up in 3 months time he is to contact us prior to that time should any new difficulties arise.  We will have him see Dr. Halford Chessman prior to that  time.   Thank you for allowing me to participate in the care of this patient.   Renold Don, MD [facility]   *This note was dictated using voice recognition software/Dragon.  Despite best efforts to proofread, errors can occur which can change the meaning.  Any change was purely unintentional.

## 2020-04-13 NOTE — Telephone Encounter (Signed)
Hold Plavix 7 days before and resume after.  Continue aspirin 81 mg daily.

## 2020-04-14 NOTE — Telephone Encounter (Signed)
   Primary Cardiologist: Kathlyn Sacramento, MD  Chart reviewed as part of pre-operative protocol coverage. Attempted to call patient.   [name] has a Hx of CAD with DES to [facility] in 2005. Had inferior STEMI 03/2019 with cardiac cath showing 90% proximal and mRCA disease treated with DESx2. dRCA stent with patent with mild ISR.   Dr. Fletcher Anon recommendations noted ok to hold plavix 7 days prior to procedure and resume afterwards. Continue on ASA 81mg  daily.   [name], NP 04/14/2020, 2:00 PM

## 2020-04-15 NOTE — Telephone Encounter (Signed)
Left a VM at 10:35 04/15/2020. Waiting for call back.

## 2020-04-20 NOTE — Telephone Encounter (Signed)
   Primary Cardiologist: Nicholas Sacramento, MD  Chart reviewed as part of pre-operative protocol coverage. Patient was contacted 04/20/2020 in reference to pre-operative risk assessment for pending surgery as outlined below.  [name] was last seen on 02/13/20 by Dr. Garen [name]. He was evaluated for chest pain at this visit which was felt to be MSK related to recent COVID-19 infection. Since that day, [name] has felt much better from a cardiac standpoint. No further episodes of chest pain. He is quite limited in his physical activity 2/2 to knee pain, but lives independently. Difficult to determine is he is able to complete >4 METs, however colonoscopy is a low risk procedure and he is stable from a cardiac standpoint.   Therefore, based on ACC/AHA guidelines, the patient would be at acceptable risk for the planned procedure without further cardiovascular testing.   In regards to his antiplatelet per Dr. Fletcher [name]-  Hold plavix 7 days prior to procedure, then resume. Continue on aspirin 81mg  daily.   I will route this recommendation to the requesting party via Epic fax function and remove from pre-op pool.  Please call with questions.  Nicholas Bellis, NP 04/20/2020, 10:27 AM

## 2020-04-30 ENCOUNTER — Encounter: Payer: Self-pay | Admitting: Pulmonary Disease

## 2020-05-12 ENCOUNTER — Encounter: Payer: Self-pay | Admitting: *Deleted

## 2020-05-12 ENCOUNTER — Other Ambulatory Visit: Payer: Self-pay

## 2020-05-12 ENCOUNTER — Emergency Department
Admission: EM | Admit: 2020-05-12 | Discharge: 2020-05-14 | Disposition: A | Discharge status: Skilled Nursing Facility | Payer: Medicare HMO | Attending: Emergency Medicine | Admitting: Emergency Medicine

## 2020-05-12 ENCOUNTER — Emergency Department: Payer: Medicare HMO

## 2020-05-12 DIAGNOSIS — N183 Chronic kidney disease, stage 3 unspecified: Secondary | ICD-10-CM | POA: Diagnosis not present

## 2020-05-12 DIAGNOSIS — I2510 Atherosclerotic heart disease of native coronary artery without angina pectoris: Secondary | ICD-10-CM | POA: Insufficient documentation

## 2020-05-12 DIAGNOSIS — M25561 Pain in right knee: Secondary | ICD-10-CM | POA: Insufficient documentation

## 2020-05-12 DIAGNOSIS — J45909 Unspecified asthma, uncomplicated: Secondary | ICD-10-CM | POA: Diagnosis not present

## 2020-05-12 DIAGNOSIS — J449 Chronic obstructive pulmonary disease, unspecified: Secondary | ICD-10-CM | POA: Insufficient documentation

## 2020-05-12 DIAGNOSIS — Z20822 Contact with and (suspected) exposure to covid-19: Secondary | ICD-10-CM | POA: Diagnosis not present

## 2020-05-12 DIAGNOSIS — M25562 Pain in left knee: Secondary | ICD-10-CM | POA: Insufficient documentation

## 2020-05-12 DIAGNOSIS — Z96641 Presence of right artificial hip joint: Secondary | ICD-10-CM | POA: Insufficient documentation

## 2020-05-12 DIAGNOSIS — Z7982 Long term (current) use of aspirin: Secondary | ICD-10-CM | POA: Diagnosis not present

## 2020-05-12 DIAGNOSIS — Z79899 Other long term (current) drug therapy: Secondary | ICD-10-CM | POA: Diagnosis not present

## 2020-05-12 DIAGNOSIS — Z7902 Long term (current) use of antithrombotics/antiplatelets: Secondary | ICD-10-CM | POA: Insufficient documentation

## 2020-05-12 DIAGNOSIS — Z87891 Personal history of nicotine dependence: Secondary | ICD-10-CM | POA: Insufficient documentation

## 2020-05-12 DIAGNOSIS — I130 Hypertensive heart and chronic kidney disease with heart failure and stage 1 through stage 4 chronic kidney disease, or unspecified chronic kidney disease: Secondary | ICD-10-CM | POA: Diagnosis not present

## 2020-05-12 DIAGNOSIS — I5042 Chronic combined systolic (congestive) and diastolic (congestive) heart failure: Secondary | ICD-10-CM | POA: Insufficient documentation

## 2020-05-12 DIAGNOSIS — Z955 Presence of coronary angioplasty implant and graft: Secondary | ICD-10-CM | POA: Diagnosis not present

## 2020-05-12 DIAGNOSIS — I252 Old myocardial infarction: Secondary | ICD-10-CM | POA: Insufficient documentation

## 2020-05-12 DIAGNOSIS — Z8546 Personal history of malignant neoplasm of prostate: Secondary | ICD-10-CM | POA: Diagnosis not present

## 2020-05-12 LAB — SARS CORONAVIRUS 2 BY RT PCR (HOSPITAL ORDER, PERFORMED IN ~~LOC~~ HOSPITAL LAB): SARS Coronavirus 2: NEGATIVE

## 2020-05-12 MED ORDER — OXYCODONE-ACETAMINOPHEN 5-325 MG PO TABS
1.0000 | ORAL_TABLET | Freq: Once | ORAL | Status: AC
  Administered 2020-05-12: 1 via ORAL
  Filled 2020-05-12: qty 1

## 2020-05-12 MED ORDER — ONDANSETRON 8 MG PO TBDP: 8.0000 mg | ORAL_TABLET | Freq: Once | ORAL | Status: DC

## 2020-05-12 MED ORDER — LIDOCAINE 5 % EX PTCH
2.0000 | MEDICATED_PATCH | CUTANEOUS | Status: DC
  Administered 2020-05-12 – 2020-05-14 (×3): 2 via TRANSDERMAL
  Filled 2020-05-12 (×4): qty 2

## 2020-05-12 MED ORDER — ONDANSETRON 4 MG PO TBDP
4.0000 mg | ORAL_TABLET | Freq: Once | ORAL | Status: AC
  Administered 2020-05-12: 4 mg via ORAL

## 2020-05-12 MED ORDER — ONDANSETRON 4 MG PO TBDP
ORAL_TABLET | ORAL | Status: AC
  Filled 2020-05-12: qty 1

## 2020-05-12 MED ORDER — ONDANSETRON 4 MG PO TBDP
4.0000 mg | ORAL_TABLET | Freq: Once | ORAL | Status: AC
  Administered 2020-05-12: 4 mg via ORAL
  Filled 2020-05-12: qty 1

## 2020-05-12 NOTE — ED Notes (Signed)
Pt noted leaving ED lobby

## 2020-05-12 NOTE — ED Notes (Signed)
C/o pain 10/10.  PA aware and meds ordered & administered.

## 2020-05-12 NOTE — ED Notes (Signed)
See triage note  Presents via EMS from home  States he fell   Having pain to bilateral knee    States he lives by hisself  Has had freq falls recently

## 2020-05-12 NOTE — ED Notes (Signed)
Resting at present   awaiting PT consult

## 2020-05-12 NOTE — TOC Initial Note (Signed)
Transition of Care [facility]) - Initial/Assessment Note    Patient Details  Name: [name] MRN: [id number] Date of Birth: 1935/08/25  [facility] of [facility]) CM/SW Contact:    Anselm Pancoast, RN Phone Number: 05/12/2020, 4:18 PM  Clinical Narrative:                 PASRR [phone] A. Fl2 completed and sent out for SNF request.   Expected Discharge Plan: Skilled Nursing Facility Barriers to Discharge: Continued Medical Work up   Patient Goals and CMS Choice Patient states their goals for this hospitalization and ongoing recovery are:: Get pain relief and stronger      Expected Discharge Plan and Services Expected Discharge Plan: [place] Choice: [place] Living arrangements for the past 2 months: Single Family Home                                      Prior Living Arrangements/Services Living arrangements for the past 2 months: Single Family Home Lives with:: Self Patient language and need for interpreter reviewed:: Yes Do you feel safe going back to the place where you live?: No   Unable to walk  Need for Family Participation in Patient Care: Yes (Comment) Care giver support system in place?: Yes (comment) Current home services: DME(walker, wheelchair) Criminal Activity/Legal Involvement Pertinent to Current Situation/Hospitalization: No - Comment as needed  Activities of Daily Living      Permission Sought/Granted Permission sought to share information with : Chartered certified accountant granted to share information with : Yes, Verbal Permission Granted  Share Information with NAME: [facility] department and SNF requests           Emotional Assessment Appearance:: Appears stated age Attitude/Demeanor/Rapport: Engaged Affect (typically observed): Frustrated, Restless Orientation: : Oriented to Place, Oriented to  Time, Oriented to Situation, Oriented to Self Alcohol / Substance  Use: Never Used Psych Involvement: No (comment)  Admission diagnosis:  Fall/Knee Pain Patient Active Problem List   Diagnosis Date Noted  . Acute respiratory failure with hypoxia ([place]) 04/12/2019  . STEMI (ST elevation myocardial infarction) ([place]) 04/01/2019  . STEMI involving right coronary artery ([place]) 04/01/2019  . Acute ST elevation myocardial infarction (STEMI) ([place]) 04/01/2019  . Lower extremity pain, bilateral 05/15/2018  . Elevated troponin I level 07/31/2017  . Hyperkalemia 05/28/2017  . Syncope 05/27/2017  . Meningioma ([place]) 01/05/2017  . GI bleed 12/01/2016  . Alcohol abuse 10/30/2016  . Acute on chronic systolic heart failure ([place]) 10/04/2016  . COPD (chronic obstructive pulmonary disease) with chronic bronchitis ([place]) 10/04/2016  . Obstructive sleep apnea 10/04/2016  . Hyponatremia 08/31/2016  . UTI (lower urinary tract infection) 08/31/2016  . Shortness of breath 02/11/2016  . Hyperlipidemia 12/03/2015  . Essential hypertension   . Coronary artery disease 01/19/2004   PCP:  Marguerita Merles, MD Pharmacy:   [place], [place] - [address] [place] [address] Phone: [phone] Fax: [phone]     Social Determinants of Health (SDOH) Interventions    Readmission Risk Interventions No flowsheet data found.

## 2020-05-12 NOTE — TOC Initial Note (Signed)
Transition of Care [facility]) - Initial/Assessment Note    Patient Details  Name: [name] MRN: [id number] Date of Birth: [birth year]  [facility]) CM/SW Contact:    Anselm Pancoast, RN Phone Number: 05/12/2020, 4:11 PM  Clinical Narrative:                 Spoke to patient who expressed need for SNF placement due to living alone and not being able to walk or care for himself. Patient states he is in terrible pain and was very emotional during conversation. Patient frustrated with having to spend the night in the ED due to SNF transfer and insurance approval but understands process takes time.   Expected Discharge Plan: Skilled Nursing Facility Barriers to Discharge: Continued Medical Work up   Patient Goals and CMS Choice Patient states their goals for this hospitalization and ongoing recovery are:: Get pain relief and stronger      Expected Discharge Plan and Services Expected Discharge Plan: [place] Choice: [place] Living arrangements for the past 2 months: Single Family Home                                      Prior Living Arrangements/Services Living arrangements for the past 2 months: Single Family Home Lives with:: Self Patient language and need for interpreter reviewed:: Yes Do you feel safe going back to the place where you live?: No   Unable to walk  Need for Family Participation in Patient Care: Yes (Comment) Care giver support system in place?: Yes (comment) Current home services: DME(walker, wheelchair) Criminal Activity/Legal Involvement Pertinent to Current Situation/Hospitalization: No - Comment as needed  Activities of Daily Living      Permission Sought/Granted Permission sought to share information with : Chartered certified accountant granted to share information with : Yes, Verbal Permission Granted  Share Information with NAME: [facility] department and SNF requests            Emotional Assessment Appearance:: Appears stated age Attitude/Demeanor/Rapport: Engaged Affect (typically observed): Frustrated, Restless Orientation: : Oriented to Place, Oriented to  Time, Oriented to Situation, Oriented to Self Alcohol / Substance Use: Never Used Psych Involvement: No (comment)  Admission diagnosis:  Fall/Knee Pain Patient Active Problem List   Diagnosis Date Noted  . Acute respiratory failure with hypoxia ([place]) 04/12/2019  . STEMI (ST elevation myocardial infarction) ([place]) 04/01/2019  . STEMI involving right coronary artery ([place]) 04/01/2019  . Acute ST elevation myocardial infarction (STEMI) ([place]) 04/01/2019  . Lower extremity pain, bilateral 05/15/2018  . Elevated troponin I level 07/31/2017  . Hyperkalemia 05/28/2017  . Syncope 05/27/2017  . Meningioma ([place]) 01/05/2017  . GI bleed 12/01/2016  . Alcohol abuse 10/30/2016  . Acute on chronic systolic heart failure ([place]) 10/04/2016  . COPD (chronic obstructive pulmonary disease) with chronic bronchitis ([place]) 10/04/2016  . Obstructive sleep apnea 10/04/2016  . Hyponatremia 08/31/2016  . UTI (lower urinary tract infection) 08/31/2016  . Shortness of breath 02/11/2016  . Hyperlipidemia 12/03/2015  . Essential hypertension   . Coronary artery disease 01/19/2004   PCP:  Marguerita Merles, MD Pharmacy:   [place], [place] - [address] [place] [address] Phone: [phone] Fax: [phone]     Social Determinants of Health (SDOH) Interventions    Readmission Risk Interventions No flowsheet  data found.

## 2020-05-12 NOTE — NC FL2 (Addendum)
[name] LEVEL OF CARE SCREENING TOOL     IDENTIFICATION  Patient Name: [name] Birthdate: 1935-05-15 Sex: male Admission Date (Current Location): 05/12/2020  [place] and [place] Number:  Engineering geologist and Address:  [facility], [address], [place], [place] [address]      Provider Number: [id number]  Attending Physician Name and Address:  Carrie Mew, MD  Relative Name and Phone Number:       Current Level of Care: Hospital Recommended Level of Care: [place] Prior Approval Number:    Date Approved/Denied:   PASRR Number:    Discharge Plan: SNF    Current Diagnoses: Patient Active Problem List   Diagnosis Date Noted  . Acute respiratory failure with hypoxia ([place]) 04/12/2019  . STEMI (ST elevation myocardial infarction) ([place]) 04/01/2019  . STEMI involving right coronary artery ([place]) 04/01/2019  . Acute ST elevation myocardial infarction (STEMI) ([place]) 04/01/2019  . Lower extremity pain, bilateral 05/15/2018  . Elevated troponin I level 07/31/2017  . Hyperkalemia 05/28/2017  . Syncope 05/27/2017  . Meningioma ([place]) 01/05/2017  . GI bleed 12/01/2016  . Alcohol abuse 10/30/2016  . Acute on chronic systolic heart failure ([place]) 10/04/2016  . COPD (chronic obstructive pulmonary disease) with chronic bronchitis ([place]) 10/04/2016  . Obstructive sleep apnea 10/04/2016  . Hyponatremia 08/31/2016  . UTI (lower urinary tract infection) 08/31/2016  . Shortness of breath 02/11/2016  . Hyperlipidemia 12/03/2015  . Essential hypertension   . Coronary artery disease 01/19/2004    Orientation RESPIRATION BLADDER Height & Weight     Self, Time, Situation, Place  Normal Continent Weight: 122.5 kg Height:  5\' 7"  (170.2 cm)  BEHAVIORAL SYMPTOMS/MOOD NEUROLOGICAL BOWEL NUTRITION STATUS      Continent Diet  AMBULATORY STATUS COMMUNICATION OF NEEDS Skin   Extensive Assist Verbally Normal                     Personal Care Assistance Level of Assistance  Bathing, Dressing Bathing Assistance: Limited assistance   Dressing Assistance: Limited assistance     Functional Limitations Info             SPECIAL CARE FACTORS FREQUENCY  PT (By licensed PT), OT (By licensed OT)     PT Frequency: min 5xweek OT Frequency: min 5xweek            Contractures      Additional Factors Info                  Current Medications (05/12/2020):  This is the current hospital active medication list Current Facility-Administered Medications  Medication Dose Route Frequency Provider Last Rate Last Admin  . lidocaine (LIDODERM) 5 % 2 patch  2 patch Transdermal Q24H [name], PA-C   2 patch at 05/12/20 8119   Current Outpatient Medications  Medication Sig Dispense Refill  . ADULT ASPIRIN REGIMEN 81 MG EC tablet TAKE 1 TABLET BY MOUTH DAILY 30 tablet 2  . albuterol (VENTOLIN HFA) 108 (90 Base) MCG/ACT inhaler USE 2 PUFFS EVERY 4 HOURS AS NEEDED 18 g 5  . amLODipine (NORVASC) 5 MG tablet Take 1 tablet (5 mg total) by mouth daily. 30 tablet 5  . atorvastatin (LIPITOR) 40 MG tablet TAKE 1 TABLET BY MOUTH DAILY 30 tablet 3  . azelastine (ASTELIN) 0.1 % nasal spray Place 2 sprays into both nostrils 2 (two) times daily. Use in each nostril as directed    . carvedilol (  COREG) 6.25 MG tablet Take 1 tablet (6.25 mg total) by mouth 2 (two) times daily. 180 tablet 3  . clopidogrel (PLAVIX) 75 MG tablet Take 1 tablet (75 mg total) by mouth daily. 30 tablet 0  . famotidine (PEPCID) 20 MG tablet Take 1 tablet (20 mg total) by mouth 2 (two) times daily. 60 tablet 0  . fluticasone (FLONASE) 50 MCG/ACT nasal spray Place 2 sprays into both nostrils daily.    . furosemide (LASIX) 40 MG tablet Take 1 tablet (40 mg total) by mouth daily. And additional 40mg  in the afternoon as needed 40 tablet 5  . HYDROcodone-acetaminophen (NORCO/VICODIN) 5-325 MG tablet Take 1 tablet by mouth every 6 (six) hours as  needed for moderate pain.    . isosorbide mononitrate (IMDUR) 30 MG 24 hr tablet TAKE 1 TABLET BY MOUTH DAILY 30 tablet 12  . losartan (COZAAR) 25 MG tablet Take 25 mg by mouth daily.     . nitroGLYCERIN (NITROSTAT) 0.4 MG SL tablet Take 0.4 mg by mouth every 5 (five) minutes x 3 doses as needed for chest pain. As needed for chest pain    . pantoprazole (PROTONIX) 40 MG tablet Take 40 mg by mouth every morning.    . traMADol (ULTRAM) 50 MG tablet Take 50 mg by mouth every 6 (six) hours as needed.    Viviana Simpler ELLIPTA 200-62.5-25 MCG/INH AEPB Inhale 1 puff into the lungs daily.        Discharge Medications: Please see discharge summary for a list of discharge medications.  Relevant Imaging Results:  Relevant Lab Results:   Additional Information SSN [id number]  Anselm Pancoast, RN

## 2020-05-12 NOTE — ED Triage Notes (Signed)
Pt to triage via wheelchair.  Pt brought in by ems from home.  Pt has bilateral knee pain.  Pt reports falling twice tonight. States he gets cortisone shots and he can't stand because his knees have given out.  Pt alert.

## 2020-05-12 NOTE — ED Provider Notes (Signed)
[facility] Emergency Department Provider Note   ____________________________________________   First MD Initiated Contact with Patient 05/12/20 [phone]     (approximate)  I have reviewed the triage vital signs and the nursing notes.   HISTORY  Chief Complaint Knee Pain    HPI [name] is a 85 y.o. male patient presents with bilateral knee pain secondary to repetitive falls at home.  Patient has severe arthritis bilateral knees.  Patient lives alone.  Patient had 2 incidents of falling last night.  Patient ambulates with walker.  Patient called EMS twice to assist him to get off the floor.  Second episode brought him to the ED.  Patient states the use of Lasix causing him to have to get out of bed repetitive during the night to void.  Patient also takes a blood thinner      Past Medical History:  Diagnosis Date  . Alcoholism ([place])   . Asthma   . Bipolar affective disorder ([place])   . Chronic combined systolic and diastolic CHF (congestive heart failure) ([place])    a. 08/2016 Echo: EF 40-45%, mild AS, mild to mod MR, mildly dil LA/RA, mild-mod TR; b. 07/2017 Echo: EF 40-45%, mod LVH, Gr1 DD, mild to mod AS, mildly dil LA, nl RV fxn; c. 03/2019 Echo: EF 45-50%, AS (not severe). Mod dil PA.  . CKD (chronic kidney disease), stage III   . COPD (chronic obstructive pulmonary disease) ([place])   . Coronary artery disease    a. 01/2004 s/p PCI and Taxus DES to dRCA (3.5 x 12 mm); b. 07/2017 Lexiscan MV: no ischemia. Sm area of apicl thinning, likely attenuation. EF 33% (GI uptake noted)-->Low risk; c. 03/2019 Inf STEMI/PCI: LM nl, LAD min irregs, D1 20ost, RI 20ost, LCX nl, RCA 90p/51m (4.0x26 Resolute Onyx DES), 54m (4.0x15 Resolute Onyx DES), 10d ISR.  Marland Kitchen Degenerative joint disease    knees and hip  . Dyspnea    on exertion  . Essential hypertension   . Hyperlipidemia   . Hypertension    controlled on meds  . Ischemic cardiomyopathy    a. 08/2016 Echo: EF 40-45%; b.  07/2017 Echo: EF 40-45%; c. 03/2019 Echo: EF 45-50%.  . OSA (obstructive sleep apnea)   . Prostate CA [facility])    prostate ca dx 20 yrs ago  . PVD (peripheral vascular disease) ([place])   . Tobacco abuse     Patient Active Problem List   Diagnosis Date Noted  . Acute respiratory failure with hypoxia ([place]) 04/12/2019  . STEMI (ST elevation myocardial infarction) ([place]) 04/01/2019  . STEMI involving right coronary artery ([place]) 04/01/2019  . Acute ST elevation myocardial infarction (STEMI) ([place]) 04/01/2019  . Lower extremity pain, bilateral 05/15/2018  . Elevated troponin I level 07/31/2017  . Hyperkalemia 05/28/2017  . Syncope 05/27/2017  . Meningioma ([place]) 01/05/2017  . GI bleed 12/01/2016  . Alcohol abuse 10/30/2016  . Acute on chronic systolic heart failure ([place]) 10/04/2016  . COPD (chronic obstructive pulmonary disease) with chronic bronchitis ([place]) 10/04/2016  . Obstructive sleep apnea 10/04/2016  . Hyponatremia 08/31/2016  . UTI (lower urinary tract infection) 08/31/2016  . Shortness of breath 02/11/2016  . Hyperlipidemia 12/03/2015  . Essential hypertension   . Coronary artery disease 01/19/2004    Past Surgical History:  Procedure Laterality Date  . CARDIAC CATHETERIZATION    . CORONARY ANGIOGRAPHY N/A 04/01/2019   Procedure: CORONARY ANGIOGRAPHY;  Surgeon: Nelva Bush, MD;  Location: [facility];  Service: Cardiovascular;  Laterality: N/A;  . CORONARY ANGIOPLASTY WITH STENT PLACEMENT     x2  . CORONARY/GRAFT ACUTE MI REVASCULARIZATION N/A 04/01/2019   Procedure: Coronary/Graft Acute MI Revascularization;  Surgeon: Nelva Bush, MD;  Location: [facility];  Service: Cardiovascular;  Laterality: N/A;  . TOTAL HIP ARTHROPLASTY     right    Prior to Admission medications   Medication Sig Start Date End Date Taking? Authorizing Provider  ADULT ASPIRIN REGIMEN 81 MG EC tablet TAKE 1 TABLET BY MOUTH DAILY 03/08/20   [name], MD  albuterol  (VENTOLIN HFA) 108 (90 Base) MCG/ACT inhaler USE 2 PUFFS EVERY 4 HOURS AS NEEDED 10/14/19   Darylene Price A, FNP  amLODipine (NORVASC) 5 MG tablet Take 1 tablet (5 mg total) by mouth daily. 01/09/20   Alisa Graff, FNP  atorvastatin (LIPITOR) 40 MG tablet TAKE 1 TABLET BY MOUTH DAILY 01/27/20   [name], MD  azelastine (ASTELIN) 0.1 % nasal spray Place 2 sprays into both nostrils 2 (two) times daily. Use in each nostril as directed    [provider]  carvedilol (COREG) 6.25 MG tablet Take 1 tablet (6.25 mg total) by mouth 2 (two) times daily. 10/15/19   Alisa Graff, FNP  clopidogrel (PLAVIX) 75 MG tablet Take 1 tablet (75 mg total) by mouth daily. 10/28/19   [name], MD  famotidine (PEPCID) 20 MG tablet Take 1 tablet (20 mg total) by mouth 2 (two) times daily. 12/23/19 04/13/20  Lilia Pro., MD  fluticasone (FLONASE) 50 MCG/ACT nasal spray Place 2 sprays into both nostrils daily.    [provider]  furosemide (LASIX) 40 MG tablet Take 1 tablet (40 mg total) by mouth daily. And additional 40mg  in the afternoon as needed 10/14/19   Alisa Graff, FNP  HYDROcodone-acetaminophen (NORCO/VICODIN) 5-325 MG tablet Take 1 tablet by mouth every 6 (six) hours as needed for moderate pain.    [provider]  isosorbide mononitrate (IMDUR) 30 MG 24 hr tablet TAKE 1 TABLET BY MOUTH DAILY 12/30/19   [name], MD  losartan (COZAAR) 25 MG tablet Take 25 mg by mouth daily.  04/06/20   [provider]  nitroGLYCERIN (NITROSTAT) 0.4 MG SL tablet Take 0.4 mg by mouth every 5 (five) minutes x 3 doses as needed for chest pain. As needed for chest pain 12/06/15   [provider]  pantoprazole (PROTONIX) 40 MG tablet Take 40 mg by mouth every morning.    [provider]  traMADol (ULTRAM) 50 MG tablet Take 50 mg by mouth every 6 (six) hours as needed.    [provider]  Donnal Debar 200-62.5-25 MCG/INH AEPB Inhale 1 puff into  the lungs daily.  04/08/20   [provider]    Allergies Chlorthalidone, Lisinopril, Other, Amlodipine, Benazepril-hydrochlorothiazide, and Tramadol  Family History  Problem Relation Age of Onset  . Hypertension Mother   . Hyperlipidemia Mother   . Heart attack Mother   . Hypertension Father   . Hyperlipidemia Father   . Heart attack Father   . Prostate cancer Neg Hx   . Bladder Cancer Neg Hx   . Kidney cancer Neg Hx     Social History Social History   Tobacco Use  . Smoking status: Former Smoker    Packs/day: 1.00    Years: 50.00    Pack years: 50.00    Types: Cigarettes  . Smokeless tobacco: Never Used  Substance Use  Topics  . Alcohol use: Not Currently  . Drug use: No    Review of Systems  Constitutional: No fever/chills Eyes: No visual changes. ENT: No sore throat. Cardiovascular: Denies chest pain.  CAD. Respiratory: COPD Gastrointestinal: No abdominal pain.  No nausea, no vomiting.  No diarrhea.  No constipation. Genitourinary: Negative for dysuria. Musculoskeletal: Negative for back pain. Skin: Negative for rash. Neurological: Negative for headaches, focal weakness or numbness. Psychiatric:  Bipolar Endocrine:  Hyperlipidemia, hyperkalemia, and hypertension.  Hematological/Lymphatic:  Allergic/Immunilogical: ACE inhibitors, Chlorthalidone, amlodipine, and tramadol.  ____________________________________________   PHYSICAL EXAM:  VITAL SIGNS: ED Triage Vitals  Enc Vitals Group     BP 05/12/20 0041 112/60     Pulse Rate 05/12/20 0041 74     Resp 05/12/20 0041 18     Temp 05/12/20 0041 98.4 F (36.9 C)     Temp Source 05/12/20 0041 Oral     SpO2 05/12/20 0041 98 %     Weight 05/12/20 0041 270 lb (122.5 kg)     Height 05/12/20 0041 5\' 7"  (1.702 m)     Head Circumference --      Peak Flow --      Pain Score 05/12/20 0051 10     Pain Loc --      Pain Edu? --      Excl. in [place]? --     Constitutional: Alert and oriented. Well appearing  and in no acute distress. Eyes: Conjunctivae are normal. PERRL. EOMI. Head: Atraumatic. Nose: No congestion/rhinnorhea. Mouth/Throat: Mucous membranes are moist.  Oropharynx non-erythematous. Cardiovascular: Normal rate, regular rhythm. Grossly normal heart sounds.  Good peripheral circulation. Respiratory: Normal respiratory effort.  No retractions. Lungs CTAB. Gastrointestinal: Soft and nontender. No distention. No abdominal bruits. No CVA tenderness. Genitourinary: Deferred Musculoskeletal: No obvious deformity to bilateral knees.  Patient with bilateral mild effusion.  Moderate guarding palpation bilateral patella.  Unable to weight-bear. Neurologic:  Normal speech and language. No gross focal neurologic deficits are appreciated. No gait instability. Skin:  Skin is warm, dry and intact. No rash noted.  Abrasion left knee. Psychiatric: Mood and affect are normal. Speech and behavior are normal.  ____________________________________________   LABS (all labs ordered are listed, but only abnormal results are displayed)  Labs Reviewed  SARS CORONAVIRUS 2 BY RT PCR (HOSPITAL ORDER, [place] [facility])   ____________________________________________  EKG   ____________________________________________  RADIOLOGY  ED MD interpretation:    Official radiology report(s): DG Lumbar Spine 2-3 Views  Result Date: 05/12/2020 CLINICAL DATA:  Low back pain following fall yesterday, initial encounter EXAM: LUMBAR SPINE - 3 VIEW COMPARISON:  09/02/2008 FINDINGS: Five lumbar type vertebral bodies are well visualized. Vertebral body height is well maintained. Mild osteophytic changes are seen. Degenerative anterolisthesis of L4 on L5 is noted. Aortic calcifications are seen without aneurysmal dilatation. IMPRESSION: Degenerative changes without acute abnormality. Electronically Signed   By: Inez Catalina M.D.   On: 05/12/2020 13:30   DG Knee Complete 4 Views Left  Result Date:  05/12/2020 CLINICAL DATA:  Fall, knee pain EXAM: LEFT KNEE - COMPLETE 4+ VIEW COMPARISON:  Radiograph 05/28/2017 FINDINGS: Minimal soft tissue swelling anteriorly. Trace joint effusion. No acute osseous abnormality or suspicious osseous lesion. Mild-to-moderate tricompartmental osteoarthrosis most prominent about the patellofemoral articulation. Large bidirectional patellar enthesophytes are present. Extensive vascular calcification in the posterior soft tissues. IMPRESSION: 1. Minimal prepatellar swelling and trace effusion. No acute osseous abnormality. 2. Mild to moderate tricompartmental osteoarthrosis most prominent about the  patellofemoral articulation. 3. Atherosclerosis. Electronically Signed   By: Lovena Le M.D.   On: 05/12/2020 01:38   DG Knee Complete 4 Views Right  Result Date: 05/12/2020 CLINICAL DATA:  Knee pain post fall, bilateral knee pain EXAM: RIGHT KNEE - COMPLETE 4+ VIEW COMPARISON:  None. FINDINGS: Diffuse mild edematous swelling with a moderate joint effusion. Severe tricompartmental degenerative change with apparent medial femorotibial joint space narrowing incompletely assessed on nonweightbearing radiograph. No acute bony abnormality. Specifically, no fracture, subluxation, or dislocation. Corticated loose bodies seen along the anterior and posterior joint line. Bidirectional patellar enthesophytes are noted. Extensive vascular calcium with a fractured vascular stent in the posterior soft tissues. IMPRESSION: 1. No acute bony abnormality. Diffuse mild edematous swelling and a moderate joint effusion. 2. Severe tricompartmental degenerative change. 3. Corticated loose bodies along the anterior and posterior joint line. 4. Extensive vascular calcium with a fractured vascular stent in the posterior soft tissues. Electronically Signed   By: Lovena Le M.D.   On: 05/12/2020 01:36   DG Hip Unilat W or Wo Pelvis 2-3 Views Left  Result Date: 05/12/2020 CLINICAL DATA:  Posterior left  hip pain EXAM: DG HIP (WITH OR WITHOUT PELVIS) 2-3V LEFT COMPARISON:  August 05, 2017 FINDINGS: The patient is status post IM nail fixation of the left femur. There is advanced left hip osteoarthritis with superior joint space loss and marginal osteophyte formation. There is diffuse osteopenia which limits evaluation. No displaced fracture is seen. The patient is status post right total hip arthroplasty. Dense vascular calcifications are noted. IMPRESSION: Status post ORIF with IM nail fixation of the proximal left femur. No definite acute osseous abnormality, however limited due to diffuse osteopenia. If there is high clinical suspicion for occult hip fracture or the patient refuses to weightbear, consider further evaluation cross-sectional imaging. Electronically Signed   By: Prudencio Pair M.D.   On: 05/12/2020 13:25    ____________________________________________   PROCEDURES  Procedure(s) performed (including Critical Care):  Procedures   ____________________________________________   INITIAL IMPRESSION / ASSESSMENT AND PLAN / ED COURSE  As part of my medical decision making, I reviewed the following data within the [place] with bilateral knee pain unable to bear weight.  Discussed x-ray findings showing degenerative changes bilateral knee.  Patient's pending SNF placement.    [name] was evaluated in Emergency Department on 05/12/2020 for the symptoms described in the history of present illness. He was evaluated in the context of the global COVID-19 pandemic, which necessitated consideration that the patient might be at risk for infection with the SARS-CoV-2 virus that causes COVID-19. Institutional protocols and algorithms that pertain to the evaluation of patients at risk for COVID-19 are in a state of rapid change based on information released by regulatory bodies including the CDC and federal and state organizations. These policies and algorithms  were followed during the patient's care in the ED.       ____________________________________________   FINAL CLINICAL IMPRESSION(S) / ED DIAGNOSES  Final diagnoses:  Acute pain of both knees     ED Discharge Orders    None       Note:  This document was prepared using Dragon voice recognition software and may include unintentional dictation errors.    [name], PA-C 05/12/20 1610    Carrie Mew, MD 05/13/20 [phone]

## 2020-05-12 NOTE — Evaluation (Signed)
Physical Therapy Evaluation Patient Details Name: [name] MRN: [id number] DOB: 29-Aug-1935 Today's Date: 05/12/2020   History of Present Illness  presented to ER from home s/p bilat knee pain and recurrent falls in home environment.  Bilat knee imaging negative for acute osseous injury.  Clinical Impression  Upon evaluation, patient alert and oriented; follows commands and agreeable to participation with session as pain allows.  Reports persistent pain in bilat hips/knees, L > R, 7-8/10 per FACES scale with mobility and WBing.  Tender to palpation over L lateral hip/mid-femur, mildly over L SIJ.  Able to complete bilat hip/knee flexion partial-range against gravity, limited by pain.  Currently completes bed mobility with mod indep, though heavy use of momentum required; sit/stand and static balance with RW, mod assist +2 for safety.  Demonstrates heavy WBing bilat UEs due to pain, bilat LE buckling (L > R); requiring +2 to prevent fall.  Additional standing/stepping deferred due to fall risk, safety needs; will continue to assess/progress as medically appropriate. May benefit from L hip, L-spine imaging to rule out bony injury outside of bilat knee structures given pain presentation; RN/MD informed and aware.  Images ordered; will follow for results. Would benefit from skilled PT to address above deficits and promote optimal return to PLOF.; recommend transition to STR upon discharge from acute hospitalization. Unable to demonstrate ability to safely negotiate home environment at this time.     Follow Up Recommendations SNF    Equipment Recommendations  Rolling walker with 5" wheels;3in1 (PT)    Recommendations for Other Services       Precautions / Restrictions Precautions Precautions: Fall Restrictions Weight Bearing Restrictions: No      Mobility  Bed Mobility Overal bed mobility: Modified Independent Bed Mobility: Supine to Sit;Sit to Supine     Supine to sit: Modified  independent (Device/Increase time) Sit to supine: Modified independent (Device/Increase time)   General bed mobility comments: heavy use of momentum to complete supine/sit  Transfers Overall transfer level: Needs assistance Equipment used: Rolling walker (2 wheeled) Transfers: Sit to/from Stand Sit to Stand: Mod assist         General transfer comment: heavy WBing bilat UEs due to pain, bilat LE buckling (L > R)  Ambulation/Gait             General Gait Details: unsafe/unable to tolerate due to LE buckling, fall risk  Stairs            Wheelchair Mobility    Modified Rankin (Stroke Patients Only)       Balance Overall balance assessment: Needs assistance Sitting-balance support: Feet supported;No upper extremity supported Sitting balance-Leahy Scale: Good     Standing balance support: Bilateral upper extremity supported Standing balance-Leahy Scale: Poor Standing balance comment: heavy use of UEs to assist with balance/stability                             Pertinent Vitals/Pain Pain Assessment: Faces Faces Pain Scale: Hurts whole lot Pain Location: L > R LE, hip > knee Pain Descriptors / Indicators: Grimacing;Guarding;Moaning Pain Intervention(s): Limited activity within patient's tolerance;Monitored during session;Repositioned    Home Living Family/patient expects to be discharged to:: Private residence Living Arrangements: Alone Available Help at Discharge: Family;Available PRN/intermittently Type of Home: Apartment Home Access: Elevator     Home Layout: One level   Additional Comments: Lives on fourth floor of senior apartment complex    Prior Function  Comments: Ambulatory for limited household distances with 4WRW; limited ability to tolerate prolonged standing activities.  Daughter/family assists with driving and community errands.  Does endorse multiple fall history.     Hand Dominance        Extremity/Trunk  Assessment   Upper Extremity Assessment Upper Extremity Assessment: Overall WFL for tasks assessed    Lower Extremity Assessment Lower Extremity Assessment: Generalized weakness(grossly at least 3-/5; limited tolerance for WBing, L > R)       Communication   Communication: No difficulties  Cognition Arousal/Alertness: Awake/alert Behavior During Therapy: WFL for tasks assessed/performed Overall Cognitive Status: Within Functional Limits for tasks assessed                                        General Comments      Exercises     Assessment/Plan    PT Assessment Patient needs continued PT services  PT Problem List Decreased strength;Decreased range of motion;Decreased activity tolerance;Decreased balance;Decreased mobility;Decreased coordination;Decreased cognition;Decreased knowledge of use of DME;Decreased safety awareness;Decreased knowledge of precautions;Impaired sensation       PT Treatment Interventions DME instruction;Gait training;Functional mobility training;Therapeutic activities;Therapeutic exercise;Balance training;Neuromuscular re-education;Cognitive remediation;Patient/family education    PT Goals (Current goals can be found in the Care Plan section)  Acute Rehab PT Goals Patient Stated Goal: to get better, improve pain PT Goal Formulation: With patient Time For Goal Achievement: 05/26/20 Potential to Achieve Goals: Fair    Frequency Min 2X/week   Barriers to discharge Decreased caregiver support      Co-evaluation               AM-PAC PT "6 Clicks" Mobility  Outcome Measure Help needed turning from your back to your side while in a flat bed without using bedrails?: None Help needed moving from lying on your back to sitting on the side of a flat bed without using bedrails?: None Help needed moving to and from a bed to a chair (including a wheelchair)?: A Lot Help needed standing up from a chair using your arms (e.g., wheelchair  or bedside chair)?: A Lot Help needed to walk in hospital room?: Total Help needed climbing 3-5 steps with a railing? : Total 6 Click Score: 14    End of Session Equipment Utilized During Treatment: Gait belt Activity Tolerance: Patient tolerated treatment well Patient left: in bed;with call bell/phone within reach Nurse Communication: Mobility status PT Visit Diagnosis: Difficulty in walking, not elsewhere classified (R26.2);Repeated falls (R29.6);Muscle weakness (generalized) (M62.81);Pain Pain - Right/Left: Left Pain - part of body: Leg    Time: 1024-1050 PT Time Calculation (min) (ACUTE ONLY): 26 min   Charges:   PT Evaluation $PT Eval Moderate Complexity: 1 Mod          Kunio Cummiskey H. Owens Shark, PT, DPT, NCS 05/12/20, 12:09 PM [phone]

## 2020-05-13 ENCOUNTER — Emergency Department: Payer: Medicare HMO

## 2020-05-13 MED ORDER — ISOSORBIDE MONONITRATE ER 60 MG PO TB24
30.0000 mg | ORAL_TABLET | Freq: Every day | ORAL | Status: DC
  Administered 2020-05-13 – 2020-05-14 (×2): 30 mg via ORAL
  Filled 2020-05-13 (×2): qty 1

## 2020-05-13 MED ORDER — FUROSEMIDE 40 MG PO TABS
40.0000 mg | ORAL_TABLET | Freq: Every day | ORAL | Status: DC
  Administered 2020-05-13 – 2020-05-14 (×2): 40 mg via ORAL
  Filled 2020-05-13 (×2): qty 1

## 2020-05-13 MED ORDER — ASPIRIN EC 81 MG PO TBEC
81.0000 mg | DELAYED_RELEASE_TABLET | Freq: Every day | ORAL | Status: DC
  Administered 2020-05-13 – 2020-05-14 (×2): 81 mg via ORAL
  Filled 2020-05-13 (×2): qty 1

## 2020-05-13 MED ORDER — FLUTICASONE FUROATE-VILANTEROL 100-25 MCG/INH IN AEPB
1.0000 | INHALATION_SPRAY | Freq: Every day | RESPIRATORY_TRACT | Status: DC
  Administered 2020-05-13 – 2020-05-14 (×2): 1 via RESPIRATORY_TRACT
  Filled 2020-05-13: qty 28

## 2020-05-13 MED ORDER — CARVEDILOL 6.25 MG PO TABS
6.2500 mg | ORAL_TABLET | Freq: Two times a day (BID) | ORAL | Status: DC
  Administered 2020-05-13 – 2020-05-14 (×3): 6.25 mg via ORAL
  Filled 2020-05-13 (×3): qty 1

## 2020-05-13 MED ORDER — PANTOPRAZOLE SODIUM 40 MG PO TBEC
40.0000 mg | DELAYED_RELEASE_TABLET | ORAL | Status: DC
  Administered 2020-05-13 – 2020-05-14 (×2): 40 mg via ORAL
  Filled 2020-05-13 (×2): qty 1

## 2020-05-13 MED ORDER — IPRATROPIUM BROMIDE 0.03 % NA SOLN
2.0000 | Freq: Two times a day (BID) | NASAL | Status: DC
  Administered 2020-05-13 – 2020-05-14 (×3): 2 via NASAL
  Filled 2020-05-13: qty 30

## 2020-05-13 MED ORDER — OXYCODONE-ACETAMINOPHEN 5-325 MG PO TABS
2.0000 | ORAL_TABLET | Freq: Once | ORAL | Status: AC
  Administered 2020-05-13: 2 via ORAL
  Filled 2020-05-13: qty 2

## 2020-05-13 MED ORDER — UMECLIDINIUM BROMIDE 62.5 MCG/INH IN AEPB
1.0000 | INHALATION_SPRAY | Freq: Every day | RESPIRATORY_TRACT | Status: DC
  Administered 2020-05-13 – 2020-05-14 (×2): 1 via RESPIRATORY_TRACT
  Filled 2020-05-13: qty 7

## 2020-05-13 MED ORDER — FLUTICASONE-UMECLIDIN-VILANT 200-62.5-25 MCG/INH IN AEPB: 1.0000 | INHALATION_SPRAY | Freq: Every day | RESPIRATORY_TRACT | Status: DC

## 2020-05-13 MED ORDER — LOSARTAN POTASSIUM 25 MG PO TABS
12.5000 mg | ORAL_TABLET | Freq: Every day | ORAL | Status: DC
  Administered 2020-05-13 – 2020-05-14 (×2): 12.5 mg via ORAL
  Filled 2020-05-13 (×2): qty 0.5

## 2020-05-13 MED ORDER — AZELASTINE HCL 0.1 % NA SOLN
2.0000 | Freq: Two times a day (BID) | NASAL | Status: DC
  Administered 2020-05-13 – 2020-05-14 (×3): 2 via NASAL
  Filled 2020-05-13: qty 30

## 2020-05-13 MED ORDER — NITROGLYCERIN 0.4 MG SL SUBL: 0.4000 mg | SUBLINGUAL_TABLET | SUBLINGUAL | Status: DC | PRN

## 2020-05-13 MED ORDER — FAMOTIDINE 20 MG PO TABS
20.0000 mg | ORAL_TABLET | Freq: Two times a day (BID) | ORAL | Status: DC
  Administered 2020-05-13 – 2020-05-14 (×3): 20 mg via ORAL
  Filled 2020-05-13 (×3): qty 1

## 2020-05-13 MED ORDER — AMLODIPINE BESYLATE 5 MG PO TABS
5.0000 mg | ORAL_TABLET | Freq: Every day | ORAL | Status: DC
  Administered 2020-05-13 – 2020-05-14 (×2): 5 mg via ORAL
  Filled 2020-05-13 (×2): qty 1

## 2020-05-13 MED ORDER — ATORVASTATIN CALCIUM 20 MG PO TABS
40.0000 mg | ORAL_TABLET | Freq: Every day | ORAL | Status: DC
  Administered 2020-05-14: 40 mg via ORAL
  Filled 2020-05-13 (×2): qty 2

## 2020-05-13 MED ORDER — CLOPIDOGREL BISULFATE 75 MG PO TABS
75.0000 mg | ORAL_TABLET | Freq: Every day | ORAL | Status: DC
  Administered 2020-05-13 – 2020-05-14 (×2): 75 mg via ORAL
  Filled 2020-05-13 (×2): qty 1

## 2020-05-13 NOTE — ED Notes (Signed)
Pt requesting to go home and wait for placement there. Social work notified and will update patient about plan of care.

## 2020-05-13 NOTE — TOC Progression Note (Signed)
Transition of Care [facility]) - Progression Note   Patient Details  Name: [name] MRN: [id number] Date of Birth: September 03, 1935  Transition of Care [facility]) CM/SW [place], LCSW Phone Number: 05/13/2020, 7:15 PM  Clinical Narrative: CSW received call from RN, Janett Billow. Per RN, patient wanted to discharge home with [facility] and be placed to SNF from home as patient is uncomfortable in the ED. CSW called patient and explained that in order to be placed to SNF set up by the hospital, patient has to be currently in the hospital at the time of placement. CSW reviewed bed offers with patient. Patient requested [facility] as his first choice. CSW left voicemail for admissions director with [facility] and provided first shift SW contact number. CSW called [employer] to see if patient is managed by [facility] and was informed they manage the patient. CSW initiated insurance authorization, reference ID# [id number]. CSW faxed facesheet, ED provider note, and PT evaluation. Patient updated insurance Josem Kaufmann has been started.  Expected Discharge Plan: [place] Barriers to Discharge: Continued Medical Work up  Expected Discharge Plan and Services Expected Discharge Plan: [place] Choice: [place] arrangements for the past 2 months: Single Family Home  Readmission Risk Interventions No flowsheet data found.

## 2020-05-13 NOTE — ED Notes (Signed)
Pt cleansed of urine. New sheets and chux applied. Condom cath applied.

## 2020-05-13 NOTE — TOC Progression Note (Signed)
Transition of Care [facility]) - Progression Note    Patient Details  Name: [name] MRN: [id number] Date of Birth: Mar 08, 1935  Transition of Care [facility]) CM/SW [place], [place] Phone Number: [phone] 05/13/2020, 4:31 PM  Clinical Narrative:     SNF placement pending insurance authorization.  Expected Discharge Plan: [place] Barriers to Discharge: Continued Medical Work up  Expected Discharge Plan and Services Expected Discharge Plan: [place] Choice: [place] arrangements for the past 2 months: Single Family Home                                       Social Determinants of Health (SDOH) Interventions    Readmission Risk Interventions No flowsheet data found.

## 2020-05-13 NOTE — ED Notes (Signed)
Pt again refusing food. Given milk and water.

## 2020-05-13 NOTE — ED Notes (Signed)
Pt resting and refusing breakfast. Pt wants milk. Given milk. Pt urinated x1 in urinal.

## 2020-05-13 NOTE — ED Notes (Signed)
This RN went to check on said pt r/t him stating "his condom cath fell off in the floor"/ Pt visually upset and frustrated with the process of placement. MD gave orders to re-assess pt's willingness to stay in the ER to await SNF placement.  Pt st "I do not want to stay here but I have no way to get home and the whole reason I'm here is because I cant walk, nice of you to ask me if I want to stay now that everyone is leaving to go home"!.  This RN attempted to reason with pt in finding a common ground regarding his situation but pt was unwilling to speak/interact with this RN further.   Pt refused a new condom cath and refused for this RN to assist him with anything stating "I don't need anything other than for you to go away and leave me alone">   This RN stated "I can see you are visually upset and I acknowledge this is not where you want to be nor is sitting in the hallway the most ideal condition, however I am here to help. Should you need anything please reach out and let me know otherwise I will give you some time to cool off and will check on you later at another time."   Pt showed no sign of confusion  Regarding this statement and made no effort to acknowledge said comment.   Will attempt to check on pt later, and continue to monitor from a distance To reduce further agitation. Bed locked in lowest position with fall alarm set

## 2020-05-14 NOTE — ED Notes (Signed)
Pts daughter at bedside  

## 2020-05-14 NOTE — ED Notes (Signed)
Pt provided breakfast tray at bedside

## 2020-05-14 NOTE — TOC Transition Note (Signed)
Transition of Care [facility]) - CM/SW Discharge Note   Patient Details  Name: [name] MRN: [id number] Date of Birth: 09/20/1935  Transition of Care [facility]) CM/SW Contact:  Anselm Pancoast, RN Phone Number: 05/14/2020, 10:59 AM   Clinical Narrative:    Notified patient of acceptance to [facility] and insurance approval completed. Patient will transfer to SNF today. No further questions. ED RN and EDP notified of approval.      Barriers to Discharge: Continued Medical Work up   Patient Goals and CMS Choice Patient states their goals for this hospitalization and ongoing recovery are:: Get pain relief and stronger      Discharge Placement                       Discharge Plan and Services     Post Acute Care Choice: Skilled Nursing Facility                               Social Determinants of Health (SDOH) Interventions     Readmission Risk Interventions No flowsheet data found.

## 2020-05-14 NOTE — ED Notes (Signed)
Pt called out for breakfast, informed pt that breakfast has not been served yet but I would look and bring it as soon as it comes. Pt dissatisfied at bedside c/o slow service and not being taken care of. Re assured pt that I would do my best to take care of his needs throughout shift. Pt later apologized for being short to this RN.

## 2020-05-14 NOTE — TOC Progression Note (Signed)
Transition of Care [facility]) - Progression Note    Patient Details  Name: [name] MRN: [id number] Date of Birth: 12/04/1935  Transition of Care [facility]) CM/SW [place], [place] Phone Number: [phone] 05/14/2020, 10:38 AM  Clinical Narrative:     Pt. Has received insurance insurance auth for SNF placement.  Auth# [id number], to [facility], for five days staring 05/14/2020.  Follow up insurance auth, call on 05/18/2020, Doristine Section [phone] (fax/telephone the same).  This CSW contacted Claiborne Billings at [facility], patient has bed placement, RC/CM will confirm placement w/ patient.  Expected Discharge Plan: [place] Barriers to Discharge: Continued Medical Work up  Expected Discharge Plan and Services Expected Discharge Plan: [place] Choice: [place] arrangements for the past 2 months: Single Family Home                                       Social Determinants of Health (SDOH) Interventions    Readmission Risk Interventions No flowsheet data found.

## 2020-05-14 NOTE — ED Notes (Signed)
[facility]  CALLED FOR  TRANSPORT  TO  [facility] 

## 2020-06-08 ENCOUNTER — Ambulatory Visit: Payer: Medicare HMO | Admitting: Cardiovascular Disease

## 2020-06-08 ENCOUNTER — Encounter: Payer: Self-pay | Admitting: Cardiovascular Disease

## 2020-06-08 ENCOUNTER — Other Ambulatory Visit: Payer: Self-pay

## 2020-06-08 VITALS — BP 140/60 | HR 71 | Ht 67.0 in | Wt 291.1 lb

## 2020-06-08 DIAGNOSIS — I2510 Atherosclerotic heart disease of native coronary artery without angina pectoris: Secondary | ICD-10-CM | POA: Diagnosis not present

## 2020-06-08 DIAGNOSIS — I10 Essential (primary) hypertension: Secondary | ICD-10-CM | POA: Diagnosis not present

## 2020-06-08 DIAGNOSIS — I5022 Chronic systolic (congestive) heart failure: Secondary | ICD-10-CM

## 2020-06-08 DIAGNOSIS — E7849 Other hyperlipidemia: Secondary | ICD-10-CM | POA: Diagnosis not present

## 2020-06-08 NOTE — Patient Instructions (Signed)

## 2020-06-08 NOTE — Progress Notes (Signed)
Cardiology Office Note   Date:  06/08/2020   ID:  [name], DOB 1935-01-22, MRN [id number]  PCP:  Marguerita Merles, MD  Cardiologist:   Kathlyn Sacramento, MD   Chief Complaint  Patient presents with  . OTHER    12 month f/u c/o sob. Meds reviewed verbally with pt.      History of Present Illness: [name] is a 85 y.o. male who presents for a follow-up visit regarding coronary artery disease and chronic systolic heart failure. He is status post Taxus drug-eluting stent placement to the distal right coronary artery in 2005.  He has extensive medical problems that include borderline diabetes, hypertension, hyperlipidemia, chronic systolic/diastolic heart failure , sleep apnea not on CPAP, morbid obesity, previous tobacco use,  stage III CKD, bipolar disorder and peripheral arterial disease. Echocardiogram in August, 2018 showed an EF of 40-45%, mild to moderate aortic stenosis, mildly dilated right and left atrium. He was hospitalized in April 2020 with inferior ST elevation myocardial infarction.  Cardiac catheterization showed 90% proximal and mid RCA disease which was treated with non-overlapping drug-eluting stents.  The distal RCA stent was patent with mild in-stent restenosis.  Echocardiogram showed an EF of 45 to 50% with severe inferior wall hypokinesis. He was rehospitalized shortly after with heart failure and volume overload.  He improved with diuresis.  He was seen by Dr. Garen Lah in February with atypical chest pain after getting COVID-19 vaccine.  The pain was felt to be musculoskeletal.  He has been doing reasonably well with no recent chest pain or worsening dyspnea.  He did gain about 10 pounds but he has no significant lower extremity edema.  He takes his medications regularly.  Past Medical History:  Diagnosis Date  . Alcoholism ([place])   . Asthma   . Bipolar affective disorder ([place])   . Chronic combined systolic and diastolic CHF (congestive heart failure)  ([place])    a. 08/2016 Echo: EF 40-45%, mild AS, mild to mod MR, mildly dil LA/RA, mild-mod TR; b. 07/2017 Echo: EF 40-45%, mod LVH, Gr1 DD, mild to mod AS, mildly dil LA, nl RV fxn; c. 03/2019 Echo: EF 45-50%, AS (not severe). Mod dil PA.  . CKD (chronic kidney disease), stage III   . COPD (chronic obstructive pulmonary disease) ([place])   . Coronary artery disease    a. 01/2004 s/p PCI and Taxus DES to dRCA (3.5 x 12 mm); b. 07/2017 Lexiscan MV: no ischemia. Sm area of apicl thinning, likely attenuation. EF 33% (GI uptake noted)-->Low risk; c. 03/2019 Inf STEMI/PCI: LM nl, LAD min irregs, D1 20ost, RI 20ost, LCX nl, RCA 90p/11m (4.0x26 Resolute Onyx DES), 86m (4.0x15 Resolute Onyx DES), 10d ISR.  Marland Kitchen Degenerative joint disease    knees and hip  . Dyspnea    on exertion  . Essential hypertension   . Hyperlipidemia   . Hypertension    controlled on meds  . Ischemic cardiomyopathy    a. 08/2016 Echo: EF 40-45%; b. 07/2017 Echo: EF 40-45%; c. 03/2019 Echo: EF 45-50%.  . OSA (obstructive sleep apnea)   . Prostate CA [facility])    prostate ca dx 20 yrs ago  . PVD (peripheral vascular disease) ([place])   . Tobacco abuse     Past Surgical History:  Procedure Laterality Date  . CARDIAC CATHETERIZATION    . CORONARY ANGIOGRAPHY N/A 04/01/2019   Procedure: CORONARY ANGIOGRAPHY;  Surgeon: Nelva Bush, MD;  Location: [place] [facility];  Service: Cardiovascular;  Laterality: N/A;  . CORONARY ANGIOPLASTY WITH STENT PLACEMENT     x2  . CORONARY/GRAFT ACUTE MI REVASCULARIZATION N/A 04/01/2019   Procedure: Coronary/Graft Acute MI Revascularization;  Surgeon: Nelva Bush, MD;  Location: [place] [facility];  Service: Cardiovascular;  Laterality: N/A;  . TOTAL HIP ARTHROPLASTY     right     Current Outpatient Medications  Medication Sig Dispense Refill  . ADULT ASPIRIN REGIMEN 81 MG EC tablet TAKE 1 TABLET BY MOUTH DAILY 30 tablet 2  . albuterol (VENTOLIN HFA) 108 (90 Base) MCG/ACT inhaler USE 2 PUFFS EVERY  4 HOURS AS NEEDED 18 g 5  . amLODipine (NORVASC) 5 MG tablet Take 1 tablet (5 mg total) by mouth daily. 30 tablet 5  . atorvastatin (LIPITOR) 80 MG tablet Take 80 mg by mouth daily.    Marland Kitchen azelastine (ASTELIN) 0.1 % nasal spray Place 2 sprays into both nostrils 2 (two) times daily. Use in each nostril as directed    . carvedilol (COREG) 6.25 MG tablet Take 1 tablet (6.25 mg total) by mouth 2 (two) times daily. 180 tablet 3  . clopidogrel (PLAVIX) 75 MG tablet Take 1 tablet (75 mg total) by mouth daily. 30 tablet 0  . famotidine (PEPCID) 20 MG tablet Take 1 tablet (20 mg total) by mouth 2 (two) times daily. 60 tablet 0  . fluticasone (FLONASE) 50 MCG/ACT nasal spray Place 2 sprays into both nostrils daily.    . furosemide (LASIX) 40 MG tablet Take 1 tablet (40 mg total) by mouth daily. And additional 40mg  in the afternoon as needed 40 tablet 5  . ipratropium (ATROVENT) 0.03 % nasal spray Place 2 sprays into both nostrils 2 (two) times daily.     . isosorbide mononitrate (IMDUR) 30 MG 24 hr tablet TAKE 1 TABLET BY MOUTH DAILY 30 tablet 12  . losartan (COZAAR) 25 MG tablet Take 12.5 mg by mouth daily. 1/2 TABLET    . nitroGLYCERIN (NITROSTAT) 0.4 MG SL tablet Take 0.4 mg by mouth every 5 (five) minutes x 3 doses as needed for chest pain. As needed for chest pain    . pantoprazole (PROTONIX) 40 MG tablet Take 40 mg by mouth every morning.    Viviana Simpler ELLIPTA 200-62.5-25 MCG/INH AEPB Inhale 1 puff into the lungs daily.      No current facility-administered medications for this visit.    Allergies:   Chlorthalidone, Lisinopril, Other, Amlodipine, Benazepril-hydrochlorothiazide, and Tramadol    Social History:  The patient  reports that he has quit smoking. His smoking use included cigarettes. He has a 50.00 pack-year smoking history. He has never used smokeless tobacco. He reports previous alcohol use. He reports that he does not use drugs.   Family History:  The patient's family history includes  Heart attack in his father and mother; Hyperlipidemia in his father and mother; Hypertension in his father and mother.    ROS:  Please see the history of present illness.   Otherwise, review of systems are positive for none.   All other systems are reviewed and negative.    PHYSICAL EXAM: VS:  BP 140/60 (BP Location: Left Arm, Patient Position: Sitting, Cuff Size: Large)   Pulse 71   Ht 5\' 7"  (1.702 m)   Wt 291 lb 2 oz (132.1 kg)   SpO2 97%   BMI 45.60 kg/m  , BMI Body mass index is 45.6 kg/m. GEN: Well nourished, well developed, in no acute distress  HEENT: normal  Neck: no carotid bruits,  or masses.  Jugular venous pressure is not well visualized Cardiac: RRR; 2 out of 6 systolic murmur at the base, rubs, or gallops, trace bilateral leg edema  Respiratory:  clear to auscultation bilaterally, normal work of breathing GI: soft, nontender, nondistended, + BS MS: no deformity or atrophy  Skin: warm and dry, no rash Neuro:  Strength and sensation are intact Psych: euthymic mood, full affect   EKG:  EKG is ordered today. The ekg ordered today demonstrates normal sinus rhythm with moderate LVH.   Recent Labs: 12/23/2019: ALT 11; BUN 23; Creatinine, Ser 1.97; Hemoglobin 11.7; Platelets 252; Potassium 3.8; Sodium 135    Lipid Panel    Component Value Date/Time   CHOL 104 04/01/2019 0431   CHOL 189 04/26/2016 0810   CHOL 138 05/31/2014 0416   TRIG 30 04/01/2019 0431   TRIG 75 05/31/2014 0416   HDL 43 04/01/2019 0431   HDL 66 04/26/2016 0810   HDL 40 05/31/2014 0416   CHOLHDL 2.4 04/01/2019 0431   VLDL 6 04/01/2019 0431   VLDL 15 05/31/2014 0416   LDLCALC 55 04/01/2019 0431   LDLCALC 97 04/26/2016 0810   LDLCALC 83 05/31/2014 0416      Wt Readings from Last 3 Encounters:  06/08/20 291 lb 2 oz (132.1 kg)  05/12/20 270 lb (122.5 kg)  04/13/20 278 lb (126.1 kg)       No flowsheet data found.    ASSESSMENT AND PLAN:  1.  Coronary artery disease involving native  coronary arteries without angina: He is overall doing well with no anginal symptoms.  Continue medical therapy.  Continue dual antiplatelet therapy indefinitely as tolerated given multiple RCA stents.     2.  Chronic systolic heart failure due to ischemic cardiomyopathy: Mildly reduced ejection fraction.  Continue carvedilol and losartan.  He appears to be euvolemic on current dose of furosemide.    3.  Hyperlipidemia: Continue atorvastatin with a target LDL of less than 70.  Lipid profile in April showed an LDL of 55.  He does admit of not taking atorvastatin regularly and I discussed with him the importance of taking this.  4. Essential hypertension: Blood pressure is reasonably controlled on current medications.  5. Chronic kidney disease: Most recent creatinine was around 2.  Disposition:   FU with me in 6 months  Signed,  Kathlyn Sacramento, MD  06/08/2020 3:28 PM    [place]

## 2020-06-17 ENCOUNTER — Other Ambulatory Visit: Payer: Self-pay | Admitting: Cardiovascular Disease

## 2020-06-17 ENCOUNTER — Other Ambulatory Visit: Payer: Self-pay | Admitting: Family

## 2020-06-17 NOTE — Telephone Encounter (Signed)
Pharmacy is needed updated furosemide script with correct dosage for patient.

## 2020-06-17 NOTE — Telephone Encounter (Signed)
Patient was last seen on 06/08/20 by Dr. Fletcher Anon and no changes were made to his diuretic.  furosemide (LASIX) 40 MG tablet Take 1 tablet (40 mg total) by mouth daily. And additional 40mg  in the afternoon as needed 40 tablet [place], NP  @ the Heart failure clinic previously refilled Furosemide.  Message fwd to Dr. Fletcher Anon to advise on the correct dosage and to give an ok to refill.

## 2020-06-18 MED ORDER — FUROSEMIDE 40 MG PO TABS: 40.0000 mg | ORAL_TABLET | Freq: Every day | ORAL | 2 refills | Status: DC

## 2020-06-18 NOTE — Telephone Encounter (Signed)
Called pharmacy and s/w Bre.  She needed an updated prescription from Dr Fletcher Anon. New Rx sent to pharmacy with signature as stated by Dr Fletcher Anon.

## 2020-06-18 NOTE — Telephone Encounter (Signed)
I am not really sure what the question is. He takes 40 mg of Lasix daily with additional 40 mg in the afternoon as needed for weight gain or leg edema.

## 2020-07-19 ENCOUNTER — Other Ambulatory Visit: Payer: Self-pay | Admitting: Cardiovascular Disease

## 2020-08-16 ENCOUNTER — Ambulatory Visit: Payer: Medicare HMO | Admitting: Pain Medicine

## 2020-09-01 ENCOUNTER — Ambulatory Visit: Payer: Medicare HMO | Admitting: Gastroenterology

## 2020-09-13 ENCOUNTER — Ambulatory Visit: Payer: Medicare HMO | Attending: Pulmonary Disease

## 2020-09-13 DIAGNOSIS — I2510 Atherosclerotic heart disease of native coronary artery without angina pectoris: Secondary | ICD-10-CM | POA: Diagnosis not present

## 2020-09-13 DIAGNOSIS — R55 Syncope and collapse: Secondary | ICD-10-CM | POA: Insufficient documentation

## 2020-09-13 DIAGNOSIS — I10 Essential (primary) hypertension: Secondary | ICD-10-CM | POA: Diagnosis not present

## 2020-09-13 DIAGNOSIS — G4733 Obstructive sleep apnea (adult) (pediatric): Secondary | ICD-10-CM | POA: Diagnosis not present

## 2020-09-14 ENCOUNTER — Other Ambulatory Visit: Payer: Self-pay

## 2020-09-18 DIAGNOSIS — G894 Chronic pain syndrome: Secondary | ICD-10-CM | POA: Insufficient documentation

## 2020-09-18 DIAGNOSIS — Z789 Other specified health status: Secondary | ICD-10-CM | POA: Insufficient documentation

## 2020-09-18 DIAGNOSIS — M899 Disorder of bone, unspecified: Secondary | ICD-10-CM | POA: Insufficient documentation

## 2020-09-18 DIAGNOSIS — Z79899 Other long term (current) drug therapy: Secondary | ICD-10-CM | POA: Insufficient documentation

## 2020-09-18 NOTE — Progress Notes (Signed)
Patient: [name]  Service Category: E/M  Provider: Gaspar Cola, MD  DOB: 02-24-1935  DOS: 09/20/2020  Referring Provider: Marguerita Merles, MD  MRN: [id number]  Setting: Ambulatory outpatient  PCP: Marguerita Merles, MD  Type: New Patient  Specialty: Interventional Pain Management    Location: Office  Delivery: Face-to-face     Primary Reason(s) for Visit: Encounter for initial evaluation of one or more chronic problems (new to examiner) potentially causing chronic pain, and posing a threat to normal musculoskeletal function. (Level of risk: High) CC: Hip Pain  HPI  [name] is a 85 y.o. year old, male patient, who comes for the first time to our practice referred by Marguerita Merles, MD for our initial evaluation of his chronic pain. He has Coronary artery disease; Essential hypertension; Hyperlipidemia; Shortness of breath; Hyponatremia; UTI (lower urinary tract infection); Acute on chronic systolic heart failure (HCC); COPD (chronic obstructive pulmonary disease) with chronic bronchitis ([place]); Obstructive sleep apnea; Alcohol abuse; GI bleed; Meningioma ([place]); Syncope; Hyperkalemia; Elevated troponin I level; Lower extremity pain, bilateral; STEMI (ST elevation myocardial infarction) [facility]); STEMI involving right coronary artery ([place]); Acute ST elevation myocardial infarction (STEMI) ([place]); Acute respiratory failure with hypoxia ([place]); Chronic pain syndrome; Pharmacologic therapy; Disorder of skeletal system; Problems influencing health status; Chronic anticoagulation (Plavix); Chronic hip pain (Left); Unilateral post-traumatic osteoarthritis of hip (Left); Chronic knee pain (Bilateral); Secondary osteoarthritis of knee (Bilateral); Osteoarthritis of knees (Bilateral); Chronic constipation; Chronic shoulder pain (Bilateral); Anterolisthesis of lumbar spine (L4-5); Chronic groin pain (Left); Other intervertebral disc degeneration, lumbar region; and Neurogenic pain on their problem list. Today he  comes in for evaluation of his Hip Pain  Pain Assessment: Location: Left Hip Radiating: Denies Onset: More than a month ago Duration: Chronic pain Quality: Aching, Burning, Shooting, Stabbing, Sharp Severity: 10-Worst pain ever/10 (subjective, self-reported pain score)  Effect on ADL: limits my daily activities Timing: Constant Modifying factors: nothing BP: 137/77  HR: (!) 109  Onset and Duration: Date of onset: 3-4 years Cause of pain: arthritis Severity: NAS-11 at its worse: 10/10, NAS-11 at its best: 10/10, NAS-11 now: 10/10 and NAS-11 on the average: 10/10 Timing: Not influenced by the time of the day Aggravating Factors: Bending, Motion and Walking Alleviating Factors: nothing Associated Problems: Constipation, Depression and Sweating Quality of Pain: Aching, Agonizing and Sharp Previous Examinations or Tests: X-rays and Orthopedic evaluation Previous Treatments: Epidural steroid injections and Narcotic medications  According to the patient his worst/primary area of pain is that of the left hip which started secondary to a fracture that he suffered in 1960.  He was recently told that he also has a broken bone from an orthopedic procedure that he had on that hip.  He refers experiencing pain in the groin area.  This pain is described as a stabbing sensation.  In addition to this the patient indicates having bilateral knee pain where he had some surgery done on the right knee.  He has been taking tramadol and Zanaflex for the pain.  The patient denies having tried any Neurontin or Lyrica.  However he does take Plavix secondary to an MI that he had in 2020.  The patient indicates also having some pain in both shoulders.  Today I took the time to provide the patient with information regarding my pain practice. The patient was informed that my practice is divided into two sections: an interventional pain management section, as well as a completely separate and distinct medication  management section. I  explained that I have procedure days for my interventional therapies, and evaluation days for follow-ups and medication management. Because of the amount of documentation required during both, they are kept separated. This means that there is the possibility that he may be scheduled for a procedure on one day, and medication management the next. I have also informed him that because of staffing and facility limitations, I no longer take patients for medication management only. To illustrate the reasons for this, I gave the patient the example of surgeons, and how inappropriate it would be to refer a patient to his/her care, just to write for the post-surgical antibiotics on a surgery done by a different surgeon.   Because interventional pain management is my board-certified specialty, the patient was informed that joining my practice means that they are open to any and all interventional therapies. I made it clear that this does not mean that they will be forced to have any procedures done. What this means is that I believe interventional therapies to be essential part of the diagnosis and proper management of chronic pain conditions. Therefore, patients not interested in these interventional alternatives will be better served under the care of a different practitioner.  The patient was also made aware of my Comprehensive Pain Management Safety Guidelines where by joining my practice, they limit all of their nerve blocks and joint injections to those done by our practice, for as long as we are retained to manage their care.   Historic Controlled Substance Pharmacotherapy Review  PMP and historical list of controlled substances: Tramadol 50 mg, 1 tab p.o. 4 times daily Current opioid analgesics:  Tramadol 50 mg, 1 tab p.o. 4 times daily (200 mg/day of tramadol) (20 MME/day) MME/day: 20 mg/day  Historical Monitoring: The patient  reports no history of drug use. List of all UDS  Test(s): Lab Results  Component Value Date   MDMA NEGATIVE 12/29/2011   COCAINSCRNUR NEGATIVE 12/29/2011   COCAINSCRNUR NONE DETECTED 10/29/2010   COCAINSCRNUR NONE DETECTED 12/10/2009   COCAINSCRNUR NONE DETECTED 08/14/2009   COCAINSCRNUR NONE DETECTED 06/12/2009   COCAINSCRNUR NONE DETECTED 10/18/2008   COCAINSCRNUR NONE DETECTED 09/28/2008   COCAINSCRNUR NONE DETECTED 07/21/2008   PCPSCRNUR NEGATIVE 12/29/2011   THCU NEGATIVE 12/29/2011   THCU NONE DETECTED 10/29/2010   THCU POSITIVE (A) 12/10/2009   THCU POSITIVE (A) 08/14/2009   THCU POSITIVE (A) 06/12/2009   THCU NONE DETECTED 10/18/2008   THCU NONE DETECTED 09/28/2008   THCU NONE DETECTED 07/21/2008   ETH <5 08/30/2016   ETH (H) 03/27/2011    176        LOWEST DETECTABLE LIMIT FOR SERUM ALCOHOL IS 5 mg/dL FOR MEDICAL PURPOSES ONLY   ETH  10/29/2010    10        LOWEST DETECTABLE LIMIT FOR SERUM ALCOHOL IS 5 mg/dL FOR MEDICAL PURPOSES ONLY   ETH (H) 12/10/2009    252        LOWEST DETECTABLE LIMIT FOR SERUM ALCOHOL IS 5 mg/dL FOR MEDICAL PURPOSES ONLY   ETH  08/14/2009    <5        LOWEST DETECTABLE LIMIT FOR SERUM ALCOHOL IS 5 mg/dL FOR MEDICAL PURPOSES ONLY   ETH (H) 08/14/2009    102        LOWEST DETECTABLE LIMIT FOR SERUM ALCOHOL IS 5 mg/dL FOR MEDICAL PURPOSES ONLY   ETH (H) 06/12/2009    115        LOWEST DETECTABLE LIMIT FOR SERUM ALCOHOL IS  5 mg/dL FOR MEDICAL PURPOSES ONLY   ETH (H) 10/18/2008    14        LOWEST DETECTABLE LIMIT FOR SERUM ALCOHOL IS 11 mg/dL FOR MEDICAL PURPOSES ONLY   ETH (H) 09/28/2008    76        LOWEST DETECTABLE LIMIT FOR SERUM ALCOHOL IS 11 mg/dL FOR MEDICAL PURPOSES ONLY   ETH (H) 07/21/2008    196        LOWEST DETECTABLE LIMIT FOR SERUM ALCOHOL IS 11 mg/dL FOR MEDICAL PURPOSES ONLY   Historical Background Evaluation: [place] PMP: PDMP reviewed during this encounter. Online review of the past 28-monthperiod conducted.             PMP NARX Score Report:   Narcotic: 301 Sedative: 130 Stimulant: 000 [place] Department of public safety, offender search: (Editor, commissioningInformation) Non-contributory Risk Assessment Profile: Aberrant behavior: None observed or detected today Risk factors for fatal opioid overdose: None identified today PMP NARX Overdose Risk Score: 160 Fatal overdose hazard ratio (HR): Calculation deferred Non-fatal overdose hazard ratio (HR): Calculation deferred Risk of opioid abuse or dependence: 0.7-3.0% with doses ? 36 MME/day and 6.1-26% with doses ? 120 MME/day. Substance use disorder (SUD) risk level: See below Personal History of Substance Abuse (SUD-Substance use disorder):  Alcohol: Positive Male or Male  Illegal Drugs: Negative  Rx Drugs: Negative  ORT Risk Level calculation: Low Risk  Opioid Risk Tool - 09/20/20 1326      Family History of Substance Abuse   Alcohol Negative    Illegal Drugs Negative    Rx Drugs Negative      Personal History of Substance Abuse   Alcohol Positive Male or Male    Illegal Drugs Negative    Rx Drugs Negative      Age   Age between 159-45years  No      History of Preadolescent Sexual Abuse   History of Preadolescent Sexual Abuse Negative or Male      Psychological Disease   Psychological Disease Negative    Depression Negative      Total Score   Opioid Risk Tool Scoring 3    Opioid Risk Interpretation Low Risk          ORT Scoring interpretation table:  Score <3 = Low Risk for SUD  Score between 4-7 = Moderate Risk for SUD  Score >8 = High Risk for Opioid Abuse   PHQ-2 Depression Scale:  Total score:    PHQ-2 Scoring interpretation table: (Score and probability of major depressive disorder)  Score 0 = No depression  Score 1 = 15.4% Probability  Score 2 = 21.1% Probability  Score 3 = 38.4% Probability  Score 4 = 45.5% Probability  Score 5 = 56.4% Probability  Score 6 = 78.6% Probability   PHQ-9 Depression Scale:  Total score:    PHQ-9 Scoring interpretation  table:  Score 0-4 = No depression  Score 5-9 = Mild depression  Score 10-14 = Moderate depression  Score 15-19 = Moderately severe depression  Score 20-27 = Severe depression (2.4 times higher risk of SUD and 2.89 times higher risk of overuse)   Pharmacologic Plan: As per protocol, I have not taken over any controlled substance management, pending the results of ordered tests and/or consults.            Initial impression: Pending review of available data and ordered tests.  Meds   Current Outpatient Medications:  .  ADULT ASPIRIN REGIMEN 81 MG  EC tablet, TAKE 1 TABLET BY MOUTH DAILY, Disp: 30 tablet, Rfl: 2 .  albuterol (VENTOLIN HFA) 108 (90 Base) MCG/ACT inhaler, USE 2 PUFFS EVERY 4 HOURS AS NEEDED, Disp: 18 g, Rfl: 5 .  amLODipine (NORVASC) 5 MG tablet, Take 1 tablet (5 mg total) by mouth daily., Disp: 30 tablet, Rfl: 5 .  atorvastatin (LIPITOR) 80 MG tablet, Take 0.5 tablets (40 mg total) by mouth daily., Disp: 30 tablet, Rfl: 4 .  carvedilol (COREG) 6.25 MG tablet, Take 1 tablet (6.25 mg total) by mouth 2 (two) times daily., Disp: 180 tablet, Rfl: 3 .  clopidogrel (PLAVIX) 75 MG tablet, Take 1 tablet (75 mg total) by mouth daily., Disp: 30 tablet, Rfl: 0 .  fluticasone (FLONASE) 50 MCG/ACT nasal spray, Place 2 sprays into both nostrils daily., Disp: , Rfl:  .  furosemide (LASIX) 40 MG tablet, Take 1 tablet (40 mg total) by mouth daily. And additional $RemoveBefore'40mg'[id number]$  in the afternoon as needed for weight gain and leg edema., Disp: 100 tablet, Rfl: 2 .  ipratropium (ATROVENT) 0.03 % nasal spray, Place 2 sprays into both nostrils 2 (two) times daily. , Disp: , Rfl:  .  isosorbide mononitrate (IMDUR) 30 MG 24 hr tablet, TAKE 1 TABLET BY MOUTH DAILY, Disp: 30 tablet, Rfl: 12 .  nitroGLYCERIN (NITROSTAT) 0.4 MG SL tablet, Take 0.4 mg by mouth every 5 (five) minutes x 3 doses as needed for chest pain. As needed for chest pain, Disp: , Rfl:  .  QUEtiapine (SEROQUEL) 50 MG tablet, Take 50 mg by mouth at  bedtime., Disp: , Rfl:  .  tiZANidine (ZANAFLEX) 4 MG capsule, Take 4 mg by mouth daily., Disp: , Rfl:  .  traMADol (ULTRAM) 50 MG tablet, Take by mouth every 6 (six) hours as needed., Disp: , Rfl:  .  TRELEGY ELLIPTA 200-62.5-25 MCG/INH AEPB, Inhale 1 puff into the lungs daily. , Disp: , Rfl:  .  gabapentin (NEURONTIN) 100 MG capsule, Take 1 capsule (100 mg total) by mouth at bedtime for 15 days, THEN 2 capsules (200 mg total) at bedtime for 15 days, THEN 3 capsules (300 mg total) at bedtime for 15 days. Follow written titration schedule.., Disp: 90 capsule, Rfl: 0  Imaging Review  Cervical Imaging: Cervical CT wo contrast: Results for orders placed during the hospital encounter of 06/12/09 CT Cervical Spine Wo Contrast  Narrative Clinical Data:  Syncopal episode today.  Multiple facial and forehead lacerations.  CT HEAD WITHOUT CONTRAST CT CERVICAL SPINE WITHOUT CONTRAST  Technique:  Multidetector CT imaging of the head and cervical spine was performed following the standard protocol without intravenous contrast.  Multiplanar CT image reconstructions of the cervical spine were also generated.  Comparison: None.  CT HEAD  Findings: Mildly enlarged ventricles and subarachnoid spaces.  No skull fracture, intracranial hemorrhage, mass lesions, CT evidence of acute infarction or paranasal sinus air-fluid levels.  A small triangular shaped calcific density is demonstrate lateral to the right nasal bone.  Several additional tiny adjacent calcific densities are noted.  There is also a mildly comminuted anterior nasal bone fracture with posterior displacement and angulation of the fragments.  Mild deviation of the midportion of the nasal septum to the right.  IMPRESSION:  1.  Mildly comminuted anterior nasal bone fracture. 2.  Triangular shaped fracture fragment or foreign body lateral to the posterior aspect of the nasal bone on the right.  There are several additional tiny  fracture fragments or foreign bodies in that region as well.  3.  Mild atrophy. 4.  No skull fracture or intracranial hemorrhage.  CT CERVICAL SPINE  Findings: Anterior bone fusion at the C5-C7 levels with a narrow AP diameter of the vertebral bodies, compatible with a Klippel-Feil deformity.  Anterior and posterior spur formation at the other levels of the cervical spine.  Mild levoconvex cervical scoliosis. No prevertebral soft tissue swelling, fractures or subluxations seen.  Enlargement of the posterior aspect of the left lobe of the thyroid gland.  This appears to be due to a 2.0 cm oval mass.  No enlarged lymph nodes seen.  Dense bilateral carotid artery calcifications.  IMPRESSION:  1.  No fracture or subluxation. 2.  Multilevel degenerative changes. 3.  Klippel-Feil deformity at the C5-C7 levels. 4.  2.0 cm left lobe thyroid mass.  Correlation with elective thyroid ultrasound is recommended. 5.  Dense bilateral carotid artery atheromatous calcifications.  Provider: Wardell Heath  Lumbosacral Imaging: Lumbar MR wo contrast: Results for orders placed during the hospital encounter of 05/27/17 MR LUMBAR SPINE WO CONTRAST  Narrative CLINICAL DATA:  Left buttock, lateral hip, and thigh pain radiating to the knee.  EXAM: MRI LUMBAR SPINE WITHOUT CONTRAST  TECHNIQUE: Multiplanar, multisequence MR imaging of the lumbar spine was performed. No intravenous contrast was administered.  COMPARISON:  CT abdomen 12/01/2016  FINDINGS: Body habitus reduces diagnostic sensitivity and specificity.  Segmentation: The lowest lumbar type non-rib-bearing vertebra is labeled as L5.  Alignment: 4 mm degenerative anterolisthesis at L4-5, no pars defects.  Vertebrae:  Congenitally short pedicles in the lumbar spine.  Disc desiccation at all levels between L2 and L5.  Degenerative facet edema bilaterally at the L4-5 level.  Conus medullaris: Extends to the upper L2 level and appears  normal.  Paraspinal and other soft tissues: Fluid signal intensity lesions associated with the right kidney are faintly seen and most compatible with cysts. At least 1 similar lesion on the left.  Interspinous edema at the L4-5 and L5-S1 levels on image 9/4.  Disc levels:  L1-2:  No impingement, mild disc bulge.  L2-3: Prominent central narrowing of the thecal sac with moderate bilateral foraminal stenosis, mild-to-moderate bilateral subarticular lateral recess stenosis, and mild displacement of the right L2 nerve in the lateral extraforaminal space due to congenitally short pedicles, disc bulge, facet arthropathy, and ligamentum flavum redundancy. Cross-sectional area of the thecal sac 0.6 cm^2.  L3-4: Prominent central narrowing of the thecal sac with moderate bilateral foraminal stenosis, mild to moderate bilateral subarticular lateral recess stenosis, and mild displacement of both L3 nerves in the lateral extraforaminal space due to congenitally short pedicles, disc bulge, intervertebral spurring, facet arthropathy, and ligamentum flavum redundancy. Cross-sectional area of the thecal sac 0.6 cm^2.  L4-5: Prominent central narrowing of the thecal sac with moderate bilateral foraminal stenosis, moderate bilateral subarticular lateral recess stenosis, and mild displacement of both L4 nerves in the lateral extraforaminal space due to congenitally short pedicles, disc bulge, intervertebral spurring, disc uncovering, and facet arthropathy. Cross-sectional area of the thecal sac 0.6 cm^ 2.  L5-S1: Mild left and borderline right foraminal stenosis due to facet arthropathy, disc bulge, and mild intervertebral spurring.  IMPRESSION: 1. Congenitally short pedicles, lumbar spondylosis, and degenerative disc disease causing prominent impingement at L2- 3, L3-4, and L4-5; and mild impingement at L5-S1, as detailed above. 2. Interspinous edema at L4-5 and L5-S1 suggesting  Baastrup's disease. 3. Degenerative facet edema bilaterally at L4-5. 4. 4 mm of degenerative anterolisthesis at L4-5. 5. Suspected bilateral renal cysts. These are only partially  seen on today's exam but were also present on the CT scan from December 2017.   Electronically Signed By: Van Clines M.D. On: 05/29/2017 19:23  Lumbar DG 2-3 views: Results for orders placed during the hospital encounter of 05/12/20 DG Lumbar Spine 2-3 Views  Narrative CLINICAL DATA:  Low back pain following fall yesterday, initial encounter  EXAM: LUMBAR SPINE - 3 VIEW  COMPARISON:  09/02/2008  FINDINGS: Five lumbar type vertebral bodies are well visualized. Vertebral body height is well maintained. Mild osteophytic changes are seen. Degenerative anterolisthesis of L4 on L5 is noted. Aortic calcifications are seen without aneurysmal dilatation.  IMPRESSION: Degenerative changes without acute abnormality.   Electronically Signed By: Inez Catalina M.D. On: 05/12/2020 13:30  Lumbar DG (Complete) 4+V: Results for orders placed during the hospital encounter of 05/27/06 DG Lumbar Spine Complete  Narrative Clinical Data:  Low back pain.  LUMBAR SPINE - 5 VIEW 05/27/2006:  Comparison:  None.  Findings:  Five nonrib-bearing lumbar vertebrae demonstrate anatomic alignment. No fractures are identified. The disc spaces are well-preserved. There are lateral osteophytes arising at the L2-L3 and L3-L4 levels. The oblique views demonstrate no pars defect. Facet degenerative changes are present bilaterally at L4-L5 and L5-S1. Visualized sacroiliac joints appear intact. Note is made of atherosclerotic calcification of the abdominal aorta without evidence of aneurysm. Spondylosis is noted involving the visualized lower thoracic spine.  IMPRESSION:  No acute skeletal abnormalities. Facet degenerative changes of L4-L5 and L5-S1. Lower thoracic and lumbar spondylosis as detailed  above.  Provider: Kennieth Francois  Hip Imaging: Hip-L CT wo contrast: Results for orders placed during the hospital encounter of 05/15/17 CT HIP LEFT WO CONTRAST  Narrative CLINICAL DATA:  Left hip pain. History of prior hip fracture and surgery.  EXAM: CT OF THE LEFT HIP WITHOUT CONTRAST  TECHNIQUE: Multidetector CT imaging of the left hip was performed according to the standard protocol. Multiplanar CT image reconstructions were also generated.  COMPARISON:  12/01/2016  FINDINGS: Bones/Joint/Cartilage  Left hip screw and lateral plate fixator, this captures the medial margin of the femoral neck cortex on a chronic basis. No acute regional fracture.  Severe degenerative hip arthropathy noted with severe loss of articular cartilage, a 1.4 cm subcortical cystic lesion in the left femoral head on image 67/4, and marginal spurring.  Ligaments  Suboptimally assessed by CT.  Muscles and Tendons  Fatty atrophy of part of the vastus lateralis as on image 105/3.  Soft tissues  Sigmoid colon diverticulosis.  Penile prosthesis noted, relatively empty reservoir in the left hemipelvis.  Dense atherosclerotic calcification in the left proximal SFA, may be occluding the SFA.  IMPRESSION: 1. Severe degenerative arthritis of the left hip. A left hip screws in place, slightly capturing the medial cortical margin of the femoral neck on a chronic basis. 2. Penile prosthesis noted. 3. Sigmoid colon diverticulosis. 4. Advanced atherosclerosis.   Electronically Signed By: Van Clines M.D. On: 05/15/2017 09:31  Hip-L DG 2-3 views: Results for orders placed during the hospital encounter of 05/12/20 DG Hip Unilat W or Wo Pelvis 2-3 Views Left  Narrative CLINICAL DATA:  Posterior left hip pain  EXAM: DG HIP (WITH OR WITHOUT PELVIS) 2-3V LEFT  COMPARISON:  August 05, 2017  FINDINGS: The patient is status post IM nail fixation of the left femur. There is advanced  left hip osteoarthritis with superior joint space loss and marginal osteophyte formation. There is diffuse osteopenia which limits evaluation. No displaced fracture is seen. The patient  is status post right total hip arthroplasty. Dense vascular calcifications are noted.  IMPRESSION: Status post ORIF with IM nail fixation of the proximal left femur. No definite acute osseous abnormality, however limited due to diffuse osteopenia. If there is high clinical suspicion for occult hip fracture or the patient refuses to weightbear, consider further evaluation cross-sectional imaging.   Electronically Signed By: Prudencio Pair M.D. On: 05/12/2020 13:25  Knee-R DG 4 views: Results for orders placed during the hospital encounter of 05/12/20 DG Knee Complete 4 Views Right  Narrative CLINICAL DATA:  Knee pain post fall, bilateral knee pain  EXAM: RIGHT KNEE - COMPLETE 4+ VIEW  COMPARISON:  None.  FINDINGS: Diffuse mild edematous swelling with a moderate joint effusion. Severe tricompartmental degenerative change with apparent medial femorotibial joint space narrowing incompletely assessed on nonweightbearing radiograph. No acute bony abnormality. Specifically, no fracture, subluxation, or dislocation. Corticated loose bodies seen along the anterior and posterior joint line. Bidirectional patellar enthesophytes are noted. Extensive vascular calcium with a fractured vascular stent in the posterior soft tissues.  IMPRESSION: 1. No acute bony abnormality. Diffuse mild edematous swelling and a moderate joint effusion. 2. Severe tricompartmental degenerative change. 3. Corticated loose bodies along the anterior and posterior joint line. 4. Extensive vascular calcium with a fractured vascular stent in the posterior soft tissues.   Electronically Signed By: Lovena Le M.D. On: 05/12/2020 01:36  Knee-L DG 4 views: Results for orders placed during the hospital encounter of  05/12/20 DG Knee Complete 4 Views Left  Narrative CLINICAL DATA:  Fall, knee pain  EXAM: LEFT KNEE - COMPLETE 4+ VIEW  COMPARISON:  Radiograph 05/28/2017  FINDINGS: Minimal soft tissue swelling anteriorly. Trace joint effusion. No acute osseous abnormality or suspicious osseous lesion. Mild-to-moderate tricompartmental osteoarthrosis most prominent about the patellofemoral articulation. Large bidirectional patellar enthesophytes are present. Extensive vascular calcification in the posterior soft tissues.  IMPRESSION: 1. Minimal prepatellar swelling and trace effusion. No acute osseous abnormality. 2. Mild to moderate tricompartmental osteoarthrosis most prominent about the patellofemoral articulation. 3. Atherosclerosis.   Electronically Signed By: Lovena Le M.D. On: 05/12/2020 01:38  Ankle Imaging: Ankle-L DG Complete: Results for orders placed during the hospital encounter of 05/12/20 DG Ankle Complete Left  Narrative CLINICAL DATA:  Left ankle pain.  Fall.  EXAM: LEFT ANKLE COMPLETE - 3+ VIEW  COMPARISON:  None.  FINDINGS: Distal interosseous bony bridging. No acute fracture or subluxation. Mid foot and heel spurring. Atherosclerotic calcifications.  IMPRESSION: No acute finding.   Electronically Signed By: Monte Fantasia M.D. On: 05/13/2020 05:41  Wrist Imaging: Wrist-R DG Complete: Results for orders placed during the hospital encounter of 01/29/16 DG Wrist Complete Right  Narrative CLINICAL DATA:  Pain following fall  EXAM: RIGHT WRIST - COMPLETE 3+ VIEW  COMPARISON:  None.  FINDINGS: Frontal, oblique, lateral, and ulnar deviation scaphoid images were obtained. There is extensive soft tissue swelling over the palmar aspect of the hand. There is no demonstrable acute fracture or dislocation. There is extensive osteoarthritic change in the radial carpal joint as well as in the scaphotrapezial and first carpal -metacarpal joints.  Osteoarthritic change is also noted in all MCP joints. There are subchondral cystic changes in the distal ulna. There is bony overgrowth along the lateral aspect of the distal ulna. No bony destruction or erosion.  IMPRESSION: Extensive soft tissue swelling along the palmar aspect of the hand. Extensive multifocal osteoarthritic change. No acute fracture or dislocation evident.   Electronically Signed By: Lowella Grip  III M.D. On: 01/29/2016 08:23  Complexity Note: Imaging results reviewed. Results shared with Mr. Carel, using Layman's terms.                        ROS  Cardiovascular: Daily Aspirin intake, High blood pressure and Heart attack ( Date: 02/2019) Pulmonary or Respiratory: Lung problems and Temporary stoppage of breathing during sleep Neurological: No reported neurological signs or symptoms such as seizures, abnormal skin sensations, urinary and/or fecal incontinence, being born with an abnormal open spine and/or a tethered spinal cord Psychological-Psychiatric: Depressed Gastrointestinal: No reported gastrointestinal signs or symptoms such as vomiting or evacuating blood, reflux, heartburn, alternating episodes of diarrhea and constipation, inflamed or scarred liver, or pancreas or irrregular and/or infrequent bowel movements Genitourinary: No reported renal or genitourinary signs or symptoms such as difficulty voiding or producing urine, peeing blood, non-functioning kidney, kidney stones, difficulty emptying the bladder, difficulty controlling the flow of urine, or chronic kidney disease Hematological: No reported hematological signs or symptoms such as prolonged bleeding, low or poor functioning platelets, bruising or bleeding easily, hereditary bleeding problems, low energy levels due to low hemoglobin or being anemic Endocrine: No reported endocrine signs or symptoms such as high or low blood sugar, rapid heart rate due to high thyroid levels, obesity or weight gain  due to slow thyroid or thyroid disease Rheumatologic: No reported rheumatological signs and symptoms such as fatigue, joint pain, tenderness, swelling, redness, heat, stiffness, decreased range of motion, with or without associated rash Musculoskeletal: Negative for myasthenia gravis, muscular dystrophy, multiple sclerosis or malignant hyperthermia Work History: Retired  Allergies  Mr. Corporan is allergic to chlorthalidone, lisinopril, other, amlodipine, benazepril-hydrochlorothiazide, and tramadol.  Laboratory Chemistry Profile   Renal Lab Results  Component Value Date   BUN 32 (H) 09/20/2020   CREATININE 2.28 (H) 09/20/2020   LABCREA 124 05/29/2017   BCR 14 09/20/2020   GFRAA 30 (L) 09/20/2020   GFRNONAA 26 (L) 09/20/2020   SPECGRAV 1.020 06/14/2018   PHUR 5.5 06/14/2018   PROTEINUR 3+ (A) 06/14/2018     Electrolytes Lab Results  Component Value Date   NA 132 (L) 09/20/2020   K 3.9 09/20/2020   CL 94 (L) 09/20/2020   CALCIUM 8.3 (L) 09/20/2020   MG 2.0 09/20/2020     Hepatic Lab Results  Component Value Date   AST 14 09/20/2020   ALT 11 12/23/2019   ALBUMIN 3.5 (L) 09/20/2020   ALKPHOS 163 (H) 09/20/2020   AMYLASE 56 11/01/2010   LIPASE 48 07/31/2017     ID Lab Results  Component Value Date   HIV NON REACTIVE 12/11/2009   [place] NEGATIVE 05/12/2020   MRSAPCR NEGATIVE 04/01/2019   HCVAB NEGATIVE 10/31/2010     Bone No results found.   Endocrine Lab Results  Component Value Date   GLUCOSE 102 (H) 09/20/2020   GLUCOSEU Negative 06/14/2018   HGBA1C 6.2 (H) 04/01/2019   TSH 2.854 09/11/2016     Neuropathy Lab Results  Component Value Date   VITAMINB12 330 09/20/2020   HGBA1C 6.2 (H) 04/01/2019   HIV NON REACTIVE 12/11/2009     CNS No results found.   Inflammation (CRP: Acute  ESR: Chronic) Lab Results  Component Value Date   CRP 39 (H) 09/20/2020   ESRSEDRATE >140 (H) 09/20/2020   LATICACIDVEN 1.0 10/30/2010     Rheumatology No results  found.   Coagulation Lab Results  Component Value Date   INR 1.1 09/20/2020  LABPROT 11.2 09/20/2020   APTT 31 09/20/2020   PLT 303 09/20/2020     Cardiovascular Lab Results  Component Value Date   BNP 182.0 (H) 04/12/2019   CKTOTAL 351 (H) 05/30/2014   CKMB 2.3 05/30/2014   TROPONINI 0.03 (HH) 04/12/2019   HGB 11.7 (L) 12/23/2019   HCT 36.0 (L) 12/23/2019     Screening Lab Results  Component Value Date   [place] NEGATIVE 05/12/2020   MRSAPCR NEGATIVE 04/01/2019   HCVAB NEGATIVE 10/31/2010   HIV NON REACTIVE 12/11/2009     Cancer No results found.   Allergens No results found.     Note: Lab results reviewed.  PFSH  Drug: Mr. Metallo  reports no history of drug use. Alcohol:  reports previous alcohol use. Tobacco:  reports that he has quit smoking. His smoking use included cigarettes. He has a 50.00 pack-year smoking history. He has never used smokeless tobacco. Medical:  has a past medical history of Alcoholism ([place]), Asthma, Bipolar affective disorder ([place]), Chronic combined systolic and diastolic CHF (congestive heart failure) ([place]), CKD (chronic kidney disease), stage III ([place]), COPD (chronic obstructive pulmonary disease) ([place]), Coronary artery disease, Degenerative joint disease, Dyspnea, Essential hypertension, Hyperlipidemia, Hypertension, Ischemic cardiomyopathy, OSA (obstructive sleep apnea), Prostate CA ([place]), PVD (peripheral vascular disease) ([place]), and Tobacco abuse. Family: family history includes Heart attack in his father and mother; Hyperlipidemia in his father and mother; Hypertension in his father and mother.  Past Surgical History:  Procedure Laterality Date  . CARDIAC CATHETERIZATION    . CORONARY ANGIOGRAPHY N/A 04/01/2019   Procedure: CORONARY ANGIOGRAPHY;  Surgeon: Nelva Bush, MD;  Location: [facility];  Service: Cardiovascular;  Laterality: N/A;  . CORONARY ANGIOPLASTY WITH STENT PLACEMENT     x2  . CORONARY/GRAFT ACUTE MI  REVASCULARIZATION N/A 04/01/2019   Procedure: Coronary/Graft Acute MI Revascularization;  Surgeon: Nelva Bush, MD;  Location: [facility];  Service: Cardiovascular;  Laterality: N/A;  . TOTAL HIP ARTHROPLASTY     right   Active Ambulatory Problems    Diagnosis Date Noted  . Coronary artery disease 01/19/2004  . Essential hypertension   . Hyperlipidemia 12/03/2015  . Shortness of breath 02/11/2016  . Hyponatremia 08/31/2016  . UTI (lower urinary tract infection) 08/31/2016  . Acute on chronic systolic heart failure ([place]) 10/04/2016  . COPD (chronic obstructive pulmonary disease) with chronic bronchitis ([place]) 10/04/2016  . Obstructive sleep apnea 10/04/2016  . Alcohol abuse 10/30/2016  . GI bleed 12/01/2016  . Meningioma ([place]) 01/05/2017  . Syncope 05/27/2017  . Hyperkalemia 05/28/2017  . Elevated troponin I level 07/31/2017  . Lower extremity pain, bilateral 05/15/2018  . STEMI (ST elevation myocardial infarction) ([place]) 04/01/2019  . STEMI involving right coronary artery ([place]) 04/01/2019  . Acute ST elevation myocardial infarction (STEMI) ([place]) 04/01/2019  . Acute respiratory failure with hypoxia ([place]) 04/12/2019  . Chronic pain syndrome 09/18/2020  . Pharmacologic therapy 09/18/2020  . Disorder of skeletal system 09/18/2020  . Problems influencing health status 09/18/2020  . Chronic anticoagulation (Plavix) 09/20/2020  . Chronic hip pain (Left) 09/20/2020  . Unilateral post-traumatic osteoarthritis of hip (Left) 09/20/2020  . Chronic knee pain (Bilateral) 09/20/2020  . Secondary osteoarthritis of knee (Bilateral) 09/20/2020  . Osteoarthritis of knees (Bilateral) 09/20/2020  . Chronic constipation 09/20/2020  . Chronic shoulder pain (Bilateral) 09/20/2020  . Anterolisthesis of lumbar spine (L4-5) 09/20/2020  . Chronic groin pain (Left) 09/20/2020  . Other intervertebral disc degeneration, lumbar region 09/20/2020  . Neurogenic pain 09/20/2020  Resolved  Ambulatory Problems    Diagnosis Date Noted  . Acute CHF (congestive heart failure) ([place]) 09/11/2016   Past Medical History:  Diagnosis Date  . Alcoholism ([place])   . Asthma   . Bipolar affective disorder ([place])   . Chronic combined systolic and diastolic CHF (congestive heart failure) ([place])   . CKD (chronic kidney disease), stage III ([place])   . COPD (chronic obstructive pulmonary disease) ([place])   . Degenerative joint disease   . Dyspnea   . Hypertension   . Ischemic cardiomyopathy   . OSA (obstructive sleep apnea)   . Prostate CA ([place])   . PVD (peripheral vascular disease) ([place])   . Tobacco abuse    Constitutional Exam  General appearance: Well nourished, well developed, and well hydrated. In no apparent acute distress Vitals:   09/20/20 1311  BP: 137/77  Pulse: (!) 109  Temp: (!) 97.2 F (36.2 C)  SpO2: 98%  Weight: 225 lb (102.1 kg)  Height: _0  (1.702 m)   BMI Assessment: Estimated body mass index is 35.24 kg/m as calculated from the following:   Height as of this encounter: _1  (1.702 m).   Weight as of this encounter: 225 lb (102.1 kg).  BMI interpretation table: BMI level Category Range association with higher incidence of chronic pain  <18 kg/m2 Underweight   18.5-24.9 kg/m2 Ideal body weight   25-29.9 kg/m2 Overweight Increased incidence by 20%  30-34.9 kg/m2 Obese (Class I) Increased incidence by 68%  35-39.9 kg/m2 Severe obesity (Class II) Increased incidence by 136%  >40 kg/m2 Extreme obesity (Class III) Increased incidence by 254%   Patient's current BMI Ideal Body weight  Body mass index is 35.24 kg/m. Ideal body weight: 66.1 kg (145 lb 11.6 oz) Adjusted ideal body weight: 80.5 kg (177 lb 6.9 oz)   BMI Readings from Last 4 Encounters:  09/20/20 35.24 kg/m  06/08/20 45.60 kg/m  05/12/20 42.29 kg/m  04/13/20 43.54 kg/m   Wt Readings from Last 4 Encounters:  09/20/20 225 lb (102.1 kg)  06/08/20 291 lb 2 oz (132.1 kg)  05/12/20 270 lb (122.5  kg)  04/13/20 278 lb (126.1 kg)    Psych/Mental status: Alert, oriented x 3 (person, place, & time)       Eyes: PERLA Respiratory: No evidence of acute respiratory distress  Cervical Spine Exam  Skin & Axial Inspection: No masses, redness, edema, swelling, or associated skin lesions Alignment: Symmetrical Functional ROM: Unrestricted ROM      Stability: No instability detected Muscle Tone/Strength: Functionally intact. No obvious neuro-muscular anomalies detected. Sensory (Neurological): Unimpaired Palpation: No palpable anomalies              Upper Extremity (UE) Exam    Side: Right upper extremity  Side: Left upper extremity  Skin & Extremity Inspection: Skin color, temperature, and hair growth are WNL. No peripheral edema or cyanosis. No masses, redness, swelling, asymmetry, or associated skin lesions. No contractures.  Skin & Extremity Inspection: Skin color, temperature, and hair growth are WNL. No peripheral edema or cyanosis. No masses, redness, swelling, asymmetry, or associated skin lesions. No contractures.  Functional ROM: Unrestricted ROM          Functional ROM: Unrestricted ROM          Muscle Tone/Strength: Functionally intact. No obvious neuro-muscular anomalies detected.   Muscle Tone/Strength: Functionally intact. No obvious neuro-muscular anomalies detected.  Sensory (Neurological): Unimpaired          Sensory (Neurological): Unimpaired  Palpation: No palpable anomalies              Palpation: No palpable anomalies              Provocative Test(s):  Phalen's test: deferred Tinel's test: deferred Apley's scratch test (touch opposite shoulder):  Action 1 (Across chest): deferred Action 2 (Overhead): deferred Action 3 (LB reach): deferred   Provocative Test(s):  Phalen's test: deferred Tinel's test: deferred Apley's scratch test (touch opposite shoulder):  Action 1 (Across chest): deferred Action 2 (Overhead): deferred Action 3 (LB reach): deferred     Thoracic Spine Area Exam  Skin & Axial Inspection: No masses, redness, or swelling Alignment: Symmetrical Functional ROM: Unrestricted ROM Stability: No instability detected Muscle Tone/Strength: Functionally intact. No obvious neuro-muscular anomalies detected. Sensory (Neurological): Unimpaired Muscle strength & Tone: No palpable anomalies  Lumbar Exam  Skin & Axial Inspection: No masses, redness, or swelling Alignment: Symmetrical Functional ROM: Unrestricted ROM       Stability: No instability detected Muscle Tone/Strength: Functionally intact. No obvious neuro-muscular anomalies detected. Sensory (Neurological): Unimpaired Palpation: No palpable anomalies       Provocative Tests: Hyperextension/rotation test: deferred today       Lumbar quadrant test (Kemp's test): deferred today       Lateral bending test: deferred today       Patrick's Maneuver: deferred today                   FABER* test: deferred today                   S-I anterior distraction/compression test: deferred today         S-I lateral compression test: deferred today         S-I Thigh-thrust test: deferred today         S-I Gaenslen's test: deferred today         *(Flexion, ABduction and External Rotation)  Gait & Posture Assessment  Ambulation: Unassisted Gait: Relatively normal for age and body habitus Posture: WNL   Lower Extremity Exam    Side: Right lower extremity  Side: Left lower extremity  Stability: No instability observed          Stability: No instability observed          Skin & Extremity Inspection: Skin color, temperature, and hair growth are WNL. No peripheral edema or cyanosis. No masses, redness, swelling, asymmetry, or associated skin lesions. No contractures.  Skin & Extremity Inspection: Skin color, temperature, and hair growth are WNL. No peripheral edema or cyanosis. No masses, redness, swelling, asymmetry, or associated skin lesions. No contractures.  Functional ROM: Unrestricted  ROM                  Functional ROM: Unrestricted ROM                  Muscle Tone/Strength: Functionally intact. No obvious neuro-muscular anomalies detected.  Muscle Tone/Strength: Functionally intact. No obvious neuro-muscular anomalies detected.  Sensory (Neurological): Unimpaired        Sensory (Neurological): Unimpaired        DTR: Patellar: deferred today Achilles: deferred today Plantar: deferred today  DTR: Patellar: deferred today Achilles: deferred today Plantar: deferred today  Palpation: No palpable anomalies  Palpation: No palpable anomalies   Assessment  Primary Diagnosis & Pertinent Problem List: The primary encounter diagnosis was Chronic pain syndrome. Diagnoses of Chronic hip pain (Left), Unilateral post-traumatic osteoarthritis of hip (Left), Chronic knee pain (  Bilateral), Secondary osteoarthritis of knee (Bilateral), Osteoarthritis of knees (Bilateral), Chronic shoulder pain (Bilateral), Anterolisthesis of lumbar spine (L4-5), Other intervertebral disc degeneration, lumbar region, Chronic groin pain (Left), Pharmacologic therapy, Chronic anticoagulation (Plavix), Disorder of skeletal system, Problems influencing health status, Chronic constipation, and Neurogenic pain were also pertinent to this visit.  Visit Diagnosis (New problems to examiner): 1. Chronic pain syndrome   2. Chronic hip pain (Left)   3. Unilateral post-traumatic osteoarthritis of hip (Left)   4. Chronic knee pain (Bilateral)   5. Secondary osteoarthritis of knee (Bilateral)   6. Osteoarthritis of knees (Bilateral)   7. Chronic shoulder pain (Bilateral)   8. Anterolisthesis of lumbar spine (L4-5)   9. Other intervertebral disc degeneration, lumbar region   10. Chronic groin pain (Left)   11. Pharmacologic therapy   12. Chronic anticoagulation (Plavix)   13. Disorder of skeletal system   14. Problems influencing health status   15. Chronic constipation   16. Neurogenic pain    Plan of Care  (Initial workup plan)  Note: Mr. Heard was reminded that as per protocol, today's visit has been an evaluation only. We have not taken over the patient's controlled substance management.  Problem-specific plan: No problem-specific Assessment & Plan notes found for this encounter.   Lab Orders     Compliance Drug Analysis, Ur     Comp. Metabolic Panel (12)     Magnesium     Vitamin B12     Sedimentation rate     25-Hydroxy vitamin D Lcms D2+D3     C-reactive protein     Protime-INR     APTT     Platelet count     Platelet function assay  Imaging Orders     MR LUMBAR SPINE WO CONTRAST     DG Lumbar Spine Complete W/Bend Referral Orders  No referral(s) requested today   Procedure Orders    No procedure(s) ordered today   Pharmacotherapy (current): Medications ordered:  Meds ordered this encounter  Medications  . gabapentin (NEURONTIN) 100 MG capsule    Sig: Take 1 capsule (100 mg total) by mouth at bedtime for 15 days, THEN 2 capsules (200 mg total) at bedtime for 15 days, THEN 3 capsules (300 mg total) at bedtime for 15 days. Follow written titration schedule.Marland Kitchen    Dispense:  90 capsule    Refill:  0    Fill one day early if pharmacy is closed on scheduled refill date. May substitute for generic if available.   Medications administered during this visit: [name] had no medications administered during this visit.   Pharmacological management options:  Opioid Analgesics: The patient was informed that there is no guarantee that he would be a candidate for opioid analgesics. The decision will be made following CDC guidelines. This decision will be based on the results of diagnostic studies, as well as Mr. Michel risk profile.   Membrane stabilizer: To be determined at a later time  Muscle relaxant: To be determined at a later time  NSAID: To be determined at a later time  Other analgesic(s): To be determined at a later time   Interventional management options: Mr.  Caratachea was informed that there is no guarantee that he would be a candidate for interventional therapies. The decision will be based on the results of diagnostic studies, as well as Mr. Nuzum risk profile.  Procedure(s) under consideration:  Pending results of ordered test   Provider-requested follow-up: Return for (40-min), (2V) Follow-up, (s/p  Tests).  Future Appointments  Date Time Provider [place]  10/07/2020  5:00 PM [facility]  12/02/2020  1:30 PM Jonathon Bellows, MD [facility] None  12/08/2020  2:20 PM [name], MD [id number]    Note by: Gaspar Cola, MD Date: 09/20/2020; Time: 10:30 AM

## 2020-09-20 ENCOUNTER — Other Ambulatory Visit: Payer: Self-pay

## 2020-09-20 ENCOUNTER — Other Ambulatory Visit: Admission: RE | Admit: 2020-09-20 | Payer: Medicare HMO | Source: Home / Self Care | Admitting: Pain Medicine

## 2020-09-20 ENCOUNTER — Ambulatory Visit: Payer: Medicare HMO | Attending: Pain Medicine | Admitting: Pain Medicine

## 2020-09-20 ENCOUNTER — Ambulatory Visit
Admission: RE | Admit: 2020-09-20 | Discharge: 2020-09-20 | Disposition: A | Discharge status: Home / Self Care | Payer: Medicare HMO | Attending: Pain Medicine | Admitting: Pain Medicine

## 2020-09-20 ENCOUNTER — Ambulatory Visit
Admission: RE | Admit: 2020-09-20 | Discharge: 2020-09-20 | Disposition: A | Discharge status: Home / Self Care | Payer: Medicare HMO | Source: Ambulatory Visit | Attending: Pain Medicine | Admitting: Pain Medicine

## 2020-09-20 ENCOUNTER — Encounter: Payer: Self-pay | Admitting: Pain Medicine

## 2020-09-20 VITALS — BP 137/77 | HR 109 | Temp 97.2°F | Ht 67.0 in | Wt 225.0 lb

## 2020-09-20 DIAGNOSIS — M170 Bilateral primary osteoarthritis of knee: Secondary | ICD-10-CM

## 2020-09-20 DIAGNOSIS — M51369 Other intervertebral disc degeneration, lumbar region without mention of lumbar back pain or lower extremity pain: Secondary | ICD-10-CM

## 2020-09-20 DIAGNOSIS — M174 Other bilateral secondary osteoarthritis of knee: Secondary | ICD-10-CM | POA: Diagnosis present

## 2020-09-20 DIAGNOSIS — M5136 Other intervertebral disc degeneration, lumbar region: Secondary | ICD-10-CM | POA: Insufficient documentation

## 2020-09-20 DIAGNOSIS — K5909 Other constipation: Secondary | ICD-10-CM | POA: Diagnosis present

## 2020-09-20 DIAGNOSIS — M25552 Pain in left hip: Secondary | ICD-10-CM | POA: Insufficient documentation

## 2020-09-20 DIAGNOSIS — M25562 Pain in left knee: Secondary | ICD-10-CM | POA: Diagnosis present

## 2020-09-20 DIAGNOSIS — M25561 Pain in right knee: Secondary | ICD-10-CM | POA: Diagnosis present

## 2020-09-20 DIAGNOSIS — M4316 Spondylolisthesis, lumbar region: Secondary | ICD-10-CM | POA: Insufficient documentation

## 2020-09-20 DIAGNOSIS — Z79899 Other long term (current) drug therapy: Secondary | ICD-10-CM | POA: Diagnosis present

## 2020-09-20 DIAGNOSIS — G894 Chronic pain syndrome: Secondary | ICD-10-CM

## 2020-09-20 DIAGNOSIS — M25512 Pain in left shoulder: Secondary | ICD-10-CM | POA: Insufficient documentation

## 2020-09-20 DIAGNOSIS — M1652 Unilateral post-traumatic osteoarthritis, left hip: Secondary | ICD-10-CM

## 2020-09-20 DIAGNOSIS — R1032 Left lower quadrant pain: Secondary | ICD-10-CM

## 2020-09-20 DIAGNOSIS — G8929 Other chronic pain: Secondary | ICD-10-CM | POA: Diagnosis present

## 2020-09-20 DIAGNOSIS — Z789 Other specified health status: Secondary | ICD-10-CM | POA: Diagnosis present

## 2020-09-20 DIAGNOSIS — M899 Disorder of bone, unspecified: Secondary | ICD-10-CM | POA: Diagnosis present

## 2020-09-20 DIAGNOSIS — M792 Neuralgia and neuritis, unspecified: Secondary | ICD-10-CM | POA: Diagnosis present

## 2020-09-20 DIAGNOSIS — Z7901 Long term (current) use of anticoagulants: Secondary | ICD-10-CM

## 2020-09-20 DIAGNOSIS — M25511 Pain in right shoulder: Secondary | ICD-10-CM

## 2020-09-20 MED ORDER — GABAPENTIN 100 MG PO CAPS: ORAL_CAPSULE | ORAL | 0 refills | Status: DC

## 2020-09-20 NOTE — Progress Notes (Signed)
Safety precautions to be maintained throughout the outpatient stay will include: orient to surroundings, keep bed in low position, maintain call bell within reach at all times, provide assistance with transfer out of bed and ambulation.  

## 2020-09-20 NOTE — Patient Instructions (Signed)
______________________________________________________________________________________________  Weight Management Required  URGENT: Your weight has been found to be adversely affecting your health.  Dear [name]:  Your current Estimated body mass index is 35.24 kg/m as calculated from the following:   Height as of this encounter: $RemoveBeforeD'5\' 7"'[id number]$  (1.702 m).   Weight as of this encounter: 225 lb (102.1 kg).  Please use the table below to identify your weight category and associated incidence of chronic pain, secondary to your weight.  Body Mass Index (BMI) Classification BMI level (kg/m2) Category Associated incidence of chronic pain  <18  Underweight   18.5-24.9 Ideal body weight   25-29.9 Overweight  20%  30-34.9 Obese (Class I)  68%  35-39.9 Severe obesity (Class II)  136%  >40 Extreme obesity (Class III)  254%   In addition: You will be considered "Morbidly Obese", if your BMI is above 30 and you have one or more of the following conditions which are known to be caused and/or directly associated with obesity: 1.    Type 2 Diabetes (Which in turn can lead to cardiovascular diseases (CVD), stroke, peripheral vascular diseases (PVD), retinopathy, nephropathy, and neuropathy) 2.    Cardiovascular Disease (High Blood Pressure; Congestive Heart Failure; High Cholesterol; Coronary Artery Disease; Angina; or History of Heart Attacks) 3.    Breathing problems (Asthma; obesity-hypoventilation syndrome; obstructive sleep apnea; chronic inflammatory airway disease; reactive airway disease; or shortness of breath) 4.    Chronic kidney disease 5.    Liver disease (nonalcoholic fatty liver disease) 6.    High blood pressure 7.    Acid reflux (gastroesophageal reflux disease; heartburn) 8.    Osteoarthritis (OA) (with any of the following: hip pain; knee pain; and/or low back pain) 9.    Low back pain (Lumbar Facet Syndrome; and/or Degenerative Disc Disease) 10.  Hip pain (Osteoarthritis of hip) (For  every 1 lbs of added body weight, there is a 2 lbs increase in pressure inside of each hip articulation. 1:2 mechanical relationship) 11.  Knee pain (Osteoarthritis of knee) (For every 1 lbs of added body weight, there is a 4 lbs increase in pressure inside of each knee articulation. 1:4 mechanical relationship) (patients with a BMI>30 kg/m2 were 6.8 times more likely to develop knee OA than normal-weight individuals) 12.  Cancer: Epidemiological studies have shown that obesity is a risk factor for: post-menopausal breast cancer; cancers of the endometrium, colon and kidney cancer; malignant adenomas of the oesophagus. Obese subjects have an approximately 1.5-3.5-fold increased risk of developing these cancers compared with normal-weight subjects, and it has been estimated that between 15 and 45% of these cancers can be attributed to overweight. More recent studies suggest that obesity may also increase the risk of other types of cancer, including pancreatic, hepatic and gallbladder cancer. (Ref: Obesity and cancer. Pischon T, Nthlings U, [employer] H. Proc Nutr Soc. 2008 May;67(2):128-45. doi: 94.1740/C1448185631497026.) The International Agency for Research on Cancer (IARC) has identified 13 cancers associated with overweight and obesity: meningioma, multiple myeloma, adenocarcinoma of the esophagus, and cancers of the thyroid, postmenopausal breast cancer, gallbladder, stomach, liver, pancreas, kidney, ovaries, uterus, colon and rectal (colorectal) cancers. 44 percent of all cancers diagnosed in women and 24 percent of those diagnosed in men are associated with overweight and obesity.  Recommendation: At this point it is urgent that you take a step back and concentrate in loosing weight. Dedicate 100% of your efforts on this task. Nothing else will improve your health more than bringing your weight down and your BMI  to less than 30. If you are here, you probably have chronic pain. Because most chronic pain  patients have difficulty exercising secondary to their pain, you must rely on proper nutrition and diet in order to lose the weight. If your BMI is above 40, you should seriously consider bariatric surgery. A realistic goal is to lose 10% of your body weight over a period of 12 months.  Be honest to yourself, if over time you have unsuccessfully tried to lose weight, then it is time for you to seek professional help and to enter a medically supervised weight management program, and/or undergo bariatric surgery. Stop procrastinating.   Pain management considerations:  1.    Pharmacological Problems: Be advised that the use of opioid analgesics (oxycodone; hydrocodone; morphine; methadone; codeine; and all of their derivatives) have been associated with decreased metabolism and weight gain.  For this reason, should we see that you are unable to lose weight while taking these medications, it may become necessary for [place] to taper down and indefinitely discontinue them.  2.    Technical Problems: The incidence of successful interventional therapies decreases as the patient's BMI increases. It is much more difficult to accomplish a safe and effective interventional therapy on a patient with a BMI above 35. 3.    Radiation Exposure Problems: The x-rays machine, used to accomplish injection therapies, will automatically increase their x-ray output in order to capture an appropriate bone image. This means that radiation exposure increases exponentially with the patient's BMI. (The higher the BMI, the higher the radiation exposure.) Although the level of radiation used at a given time is still safe to the patient, it is not for the physician and/or assisting staff. Unfortunately, radiation exposure is accumulative. Because physicians and the staff have to do procedures and be exposed on a daily basis, this can result in health problems such as cancer and radiation burns. Radiation exposure to the staff is monitored by the  radiation batches that they wear. The exposure levels are reported back to the staff on a quarterly basis. Depending on levels of exposure, physicians and staff may be obligated by law to decrease this exposure. This means that they have the right and obligation to refuse providing therapies where they may be overexposed to radiation. For this reason, physicians may decline to offer therapies such as radiofrequency ablation or implants to patients with a BMI above 40. 4.    Current Trends: Be advised that the current trend is to no longer offer certain therapies to patients with a BMI equal to, or above 35, due to increase perioperative risks, increased technical procedural difficulties, and excessive radiation exposure to healthcare personnel.  ______________________________________________________________________________________________

## 2020-09-21 ENCOUNTER — Telehealth: Payer: Self-pay | Admitting: *Deleted

## 2020-09-21 NOTE — Progress Notes (Signed)
Test: CRP (C-reactive protein) levels Finding(s): Elevated  Normal Level(s): Less than <1.0 mg/dL. Clinical significance: C-reactive protein (CRP) is produced by the liver. The level of CRP rises when there is inflammation throughout the body. CRP goes up in response to inflammation. High levels suggests the presence of chronic inflammation but do not identify its location or cause. Drops of previously elevated levels suggest that the inflammation or infection is subsiding and/or responding to treatment. Signs and symptoms may include: Signs or symptoms, if present, would depend on the underlying inflammatory condition that is the cause of the elevated CRP level. Possible causes:  - Most common: High levels have been observed in obese patients, individuals with bacterial infections, chronic inflammation, or flare-ups of inflammatory conditions. Patient Recommendation(s): Unless contraindicated, consider the use of anti-inflammatory diet and medications. (visit http://inflammationfactor.com/ ) ___________________________________________________________________________________   Test: ESR (Erythrocyte Sedimentation Rate) levels Finding(s): Elevated  Normal Level(s): below 30 mm/hr. Clinical significance: The sed rate is an acute phase reactant that indirectly measures the degree of inflammation present in the body. It can be acute, developing rapidly after trauma, injury or infection, for example, or can occur over an extended time (chronic) with conditions such as autoimmune diseases or cancer. The ESR is not diagnostic; it is a non-specific, screening test that may be elevated in a number of these different conditions. It provides general information about the presence or absence of an inflammatory condition. Signs and symptoms may include: None Possible causes:  - Most common: inflammation Patient Recommendation(s): Unless contraindicated, consider the use of anti-inflammatory diet and medications.  (visit http://inflammationfactor.com/ ) ___________________________________________________________________________________   - The combined elevation of the ESR & CRP, may be suggestive of an autoimmune disease. Patients with elevated levels of both should consider an evaluation by a Rheumatologist. ___________________________________________________________________________________

## 2020-09-21 NOTE — Progress Notes (Signed)
Test: Blood sugar (Glucose levels) Finding(s): High (hyperglycemia) Normal Level(s): Normal fasting (NPO x 8 hours) glucose levels are between 65-99 mg/dl, with 2 hour fasting, levels are usually less than 140 mg/dl. Clinical significance: Any random blood glucose level greater than 200 mg/dl is considered to be Diabetes. Signs and symptoms may include: (when persistently above 180 mg/dL) Increased thirst; headaches; trouble concentrating; blurred vision; frequent peeing (urination); fatigue (weakness and tired feeling); weight loss. The most common and classical symptoms of an undiagnosed diabetes with hyperglycemia are: Increased urinary frequency (polyuria), thirst (polydipsia), hunger (polyphagia), and unexplained weight loss; numbness in the extremities, pain in feet (dysesthesias), fatigue, and blurred vision; recurrent or severe infections; loss of consciousness or severe nausea/vomiting (ketoacidosis) or coma. Patient Recommendation(s): Fasting levels above 140 mg/dL or any levels above 200 mg/dL should follow-up with PCP (Primary Care Physician) for further evaluation. ___________________________________________________________________________________   -  BUN levels between 7 to 20 mg/dL (2.5 to 7.1 mmol/L) are considered normal. Elevated blood urea nitrogen can also be due to: urinary tract obstruction; congestive heart failure or recent heart attack; gastrointestinal bleeding; dehydration; shock; severe burns; certain medications, such as corticosteroids and some antibiotics; and/or a high protein diet. ___________________________________________________________________________________   - Normal Creatinine levels are between 0.5 and 0.9 mg/dl for our lab. Any condition that impairs the function of the kidneys is likely to raise the creatinine level in the blood. The most common causes of longstanding (chronic) kidney disease in adults are high blood pressure and diabetes. Other causes of  elevated blood creatinine levels include drugs, ingestion of a large amount of dietary meat, kidney infections, rhabdomyolysis (abnormal muscle breakdown), and urinary tract obstruction. ___________________________________________________________________________________   eGFR (Estimated Glomerular Filtration Rate) results are reported as milliliters/minute/1.35m (mL/min/1.73m. Because some laboratories do not collect information on a patient's race when the sample is collected for testing, they may report calculated results for both African Americans and non-African Americans.  The NaN[employer] Insurance[employer] Centersuggests only reporting actual results once values are < 60 mL/min. 1. Normal values: 90-120 mL/min 2. Below 60 mL/min suggests that some kidney damage has occurred. 3. Between 5971nd 30 indicate (Moderate) Stage 3 kidney disease. 4. Between 29 and 15 represent (Severe) Stage 4 kidney disease. 5. Less than 15 is considered (Kidney Failure) Stage 5. ___________________________________________________________________________________   Test: Sodium (Na+) levels Finding(s): Low (hyponatremia) Normal Level(s): between 135 and 145 mmol/L Hyponatremia: levels below 135 mmol/L Mild: 130-134 mmol/L Moderate: 125-129 mmol/L Severe: <125 mmol/L  Clinical significance: When severe, symptoms can include: headaches; confusion or altered mental state; seizures; decreased consciousness which can proceed to coma and death. Signs and symptoms may include:  Less severe symptoms include: restlessness; muscle spasms or cramps; weakness, and tiredness.  Possible causes:  - Dilutional: chronic conditions such as kidney failure (when excess fluid cannot be efficiently excreted) and congestive heart failure, in which excess fluid accumulates in the body. SIADH (syndrome of inappropriate anti-diuretic hormone) is a disease whereby the body produces too much anti-diuretic hormone (ADH), resulting in  retention of water in the body. - Excessive loss: when sodium is lost from the body as is the case during prolonged sweating and severe vomiting or diarrhea. - Non-medical: The eating disorder anorexia can also cause a sodium imbalance. Some drugs can lower blood sodium levels. Examples of these include diuretics (water tablets), desmopressin, and sulfonylureas. - Medical: adrenal insufficiency, hypothyroidism, and cirrhosis of the liver. Patient Recommendation(s): Contact PCP (Primary Care Physician) for further evaluation if level is  below 134 mmol/L. Urgently call PCP if levels are below 130 mmol/L and symptoms are present. ___________________________________________________________________________________   Test: Chloride (Cl-) levels Finding(s): Low (hypochloremia) Normal Level(s): between 95 and 111 mmol/L Clinical significance: It may lead to dehydration. Signs and symptoms may include: Fluid loss, dehydration, weakness or fatigue, difficulty breathing, and/or diarrhea or vomiting. Possible causes: Addison disease; Bartter syndrome; burns; congestive heart failure; dehydration; excessive sweating; hyperaldosteronism; metabolic alkalosis; respiratory acidosis (compensated); Syndrome of inappropriate diuretic hormone secretion (SIADH); or vomiting. Patient Recommendation(s): Follow-up with primary care physician. ___________________________________________________________________________________   Normal Calcium (Ca+) Level(s): Levels between 9.0 and 10.5 mg/dL Low level(s): Levels below 9.0 mg/dL (hypocalcemia) Clinical significance: High levels may cause bone pain, muscle twitching and/or weakness. Signs and symptoms may include: Hypocalcemia may be associated with numbness and tingling sensations around the mouth, anger, and toes. Muscle cramps, particularly in the back and lower extremities. Difficulty swallowing. Voice changes (due to laryngospasm). Wheezing due to bronchospasm. If  irritability, impaired intellectual capacity, depression, personality changes, fatigue, seizures and uncontrolled movements, coarse hair, brittle nails, psoriasis, dry skin, chronic itching, poor dentition, and cataracts. Possible causes:  - Most common: Low blood protein levels, especially a low level of albumin, which can result from liver disease or malnutrition, both of which may result from alcoholism or other illnesses. Low albumin is also very common in people who are acutely ill. With low albumin, only the bound calcium is low. Ionized calcium remains normal, and calcium metabolism is being regulated appropriately. - Other: Underactive parathyroid gland (hypoparathyroidism); Inherited resistance to the effects of parathyroid hormone; Extreme deficiency in dietary calcium; Decreased levels of vitamin D; Magnesium deficiency; Increased levels of phosphorus; Acute inflammation of the pancreas (pancreatitis); & Renal failure. Recommendations: - Contact primary care physician for further evaluation and recommendations. - Oral calcium gluconate 650 to 1200 mg every day in a.m. with magnesium 500 mg and vitamin D 2000 IU (international units), every morning. _____________________________________________________________________________________________    - Levels of albumin may decrease when conditions interfere with its production, increase protein breakdown, increase protein loss, and/or expand plasma volume (diluting the blood). - A low albumin may suggest liver disease. - Low albumin levels can reflect diseases in which the kidneys cannot prevent albumin from leaking from the blood into the urine and being lost. - Low albumin levels can also be seen in inflammation, shock, and malnutrition. They may be seen with conditions in which the body does not properly absorb and digest protein, such as Crohns disease or celiac disease, or in which large volumes of protein are lost from the intestines. - A  low albumin may also be seen in several other conditions, such as: Infection Burns Surgery Chronic illness Cancer Diabetes Hypothyroidism Carcinoid syndrome Plasma volume expansion due to congestive heart failure, sometimes pregnancy. ___________________________________________________________________________________   - Normal ALP (Alkaline phosphatase) levels are between 35 -105 IU/L, for our Lab. High ALP could suggest liver damage or increased bone cell activity. If other tests such as bilirubin, aspartate aminotransferase (AST), or alanine aminotransferase (ALT) are also high, usually the increased ALP is coming from the liver. Higher-than-normal ALP levels can be seen with: biliary obstruction; bone conditions; osteoblastic bone tumors; osteomalacia; a healing fracture; liver disease; hepatitis; eating a fatty meal if you have blood type O or B; hyperparathyroidism; leukemia; lymphoma; Paget disease; rickets; and/or sarcoidosis. ___________________________________________________________________________________

## 2020-09-24 ENCOUNTER — Telehealth (HOSPITAL_BASED_OUTPATIENT_CLINIC_OR_DEPARTMENT_OTHER): Payer: Medicare HMO | Admitting: Pulmonary Disease

## 2020-09-24 DIAGNOSIS — G4733 Obstructive sleep apnea (adult) (pediatric): Secondary | ICD-10-CM

## 2020-09-24 LAB — COMPLIANCE DRUG ANALYSIS, UR

## 2020-09-24 NOTE — Telephone Encounter (Signed)
Severe OSa on split study , inadequate titration on 12 cm with residual OAs & few centrals Suggest trial of autoCPAP 5-20 cm with medium full face mask & if clinically inadequate, can consider other modes (bilevel vs ASV) under supervision of sleep doc

## 2020-09-26 LAB — PROTIME-INR
INR: 1.1 (ref 0.9–1.2)
Prothrombin Time: 11.2 s (ref 9.1–12.0)

## 2020-09-26 LAB — C-REACTIVE PROTEIN: CRP: 39 mg/L — ABNORMAL HIGH (ref 0–10)

## 2020-09-26 LAB — COMP. METABOLIC PANEL (12)
AST: 14 IU/L (ref 0–40)
Albumin/Globulin Ratio: 1 — ABNORMAL LOW (ref 1.2–2.2)
Albumin: 3.5 g/dL — ABNORMAL LOW (ref 3.6–4.6)
Alkaline Phosphatase: 163 IU/L — ABNORMAL HIGH (ref 44–121)
BUN/Creatinine Ratio: 14 (ref 10–24)
BUN: 32 mg/dL — ABNORMAL HIGH (ref 8–27)
Bilirubin Total: 0.3 mg/dL (ref 0.0–1.2)
Calcium: 8.3 mg/dL — ABNORMAL LOW (ref 8.6–10.2)
Chloride: 94 mmol/L — ABNORMAL LOW (ref 96–106)
Creatinine, Ser: 2.28 mg/dL — ABNORMAL HIGH (ref 0.76–1.27)
GFR calc Af Amer: 30 mL/min/{1.73_m2} — ABNORMAL LOW (ref >59)
GFR calc non Af Amer: 26 mL/min/{1.73_m2} — ABNORMAL LOW (ref >59)
Globulin, Total: 3.4 g/dL (ref 1.5–4.5)
Glucose: 102 mg/dL — ABNORMAL HIGH (ref 65–99)
Potassium: 3.9 mmol/L (ref 3.5–5.2)
Sodium: 132 mmol/L — ABNORMAL LOW (ref 134–144)
Total Protein: 6.9 g/dL (ref 6.0–8.5)

## 2020-09-26 LAB — APTT: aPTT: 31 s (ref 24–33)

## 2020-09-26 LAB — MAGNESIUM: Magnesium: 2 mg/dL (ref 1.6–2.3)

## 2020-09-26 LAB — 25-HYDROXY VITAMIN D LCMS D2+D3
25-Hydroxy, Vitamin D-2: <1 ng/mL
25-Hydroxy, Vitamin D-3: 15 ng/mL
25-Hydroxy, Vitamin D: 16 ng/mL — ABNORMAL LOW

## 2020-09-26 LAB — SEDIMENTATION RATE: Sed Rate: >140 mm/hr — ABNORMAL HIGH (ref 0–30)

## 2020-09-26 LAB — PLATELET COUNT: Platelets: 303 10*3/uL (ref 150–450)

## 2020-09-26 LAB — VITAMIN B12: Vitamin B-12: 330 pg/mL (ref 232–1245)

## 2020-09-27 DIAGNOSIS — G4733 Obstructive sleep apnea (adult) (pediatric): Secondary | ICD-10-CM | POA: Diagnosis not present

## 2020-09-27 NOTE — Telephone Encounter (Signed)
He has complex sleep apnea.  He will need an in lab CPAP titration study with titration to BiPAP/AVAPS if needed.

## 2020-09-27 NOTE — Telephone Encounter (Signed)
Spoke to patient's daughter, [place](DPR).  [name] is aware of results and voiced her understanding. She was agreeable with cpap titration.  Order has been placed. Nothing further needed.

## 2020-10-07 ENCOUNTER — Ambulatory Visit
Admission: RE | Admit: 2020-10-07 | Discharge: 2020-10-07 | Disposition: A | Discharge status: Home / Self Care | Payer: Medicare HMO | Source: Ambulatory Visit | Attending: Pain Medicine | Admitting: Pain Medicine

## 2020-10-07 ENCOUNTER — Other Ambulatory Visit: Payer: Self-pay

## 2020-10-07 DIAGNOSIS — M5136 Other intervertebral disc degeneration, lumbar region: Secondary | ICD-10-CM | POA: Insufficient documentation

## 2020-10-07 DIAGNOSIS — M51369 Other intervertebral disc degeneration, lumbar region without mention of lumbar back pain or lower extremity pain: Secondary | ICD-10-CM

## 2020-10-19 ENCOUNTER — Other Ambulatory Visit: Payer: Self-pay | Admitting: Family

## 2020-10-25 ENCOUNTER — Other Ambulatory Visit: Payer: Self-pay

## 2020-10-25 ENCOUNTER — Encounter: Payer: Self-pay | Admitting: Pain Medicine

## 2020-10-25 ENCOUNTER — Ambulatory Visit: Payer: Medicare HMO | Attending: Pain Medicine | Admitting: Pain Medicine

## 2020-10-25 VITALS — BP 135/69 | HR 69 | Resp 18 | Ht 67.0 in | Wt 223.0 lb

## 2020-10-25 DIAGNOSIS — R700 Elevated erythrocyte sedimentation rate: Secondary | ICD-10-CM | POA: Diagnosis present

## 2020-10-25 DIAGNOSIS — E079 Disorder of thyroid, unspecified: Secondary | ICD-10-CM | POA: Diagnosis present

## 2020-10-25 DIAGNOSIS — M47817 Spondylosis without myelopathy or radiculopathy, lumbosacral region: Secondary | ICD-10-CM

## 2020-10-25 DIAGNOSIS — E559 Vitamin D deficiency, unspecified: Secondary | ICD-10-CM | POA: Diagnosis present

## 2020-10-25 DIAGNOSIS — R7989 Other specified abnormal findings of blood chemistry: Secondary | ICD-10-CM | POA: Diagnosis present

## 2020-10-25 DIAGNOSIS — G894 Chronic pain syndrome: Secondary | ICD-10-CM

## 2020-10-25 DIAGNOSIS — R739 Hyperglycemia, unspecified: Secondary | ICD-10-CM

## 2020-10-25 DIAGNOSIS — D631 Anemia in chronic kidney disease: Secondary | ICD-10-CM | POA: Insufficient documentation

## 2020-10-25 DIAGNOSIS — M25562 Pain in left knee: Secondary | ICD-10-CM | POA: Insufficient documentation

## 2020-10-25 DIAGNOSIS — M48061 Spinal stenosis, lumbar region without neurogenic claudication: Secondary | ICD-10-CM

## 2020-10-25 DIAGNOSIS — M25511 Pain in right shoulder: Secondary | ICD-10-CM | POA: Diagnosis present

## 2020-10-25 DIAGNOSIS — N1832 Chronic kidney disease, stage 3b: Secondary | ICD-10-CM | POA: Diagnosis present

## 2020-10-25 DIAGNOSIS — Q761 Klippel-Feil syndrome: Secondary | ICD-10-CM | POA: Diagnosis present

## 2020-10-25 DIAGNOSIS — R799 Abnormal finding of blood chemistry, unspecified: Secondary | ICD-10-CM | POA: Diagnosis present

## 2020-10-25 DIAGNOSIS — G8929 Other chronic pain: Secondary | ICD-10-CM | POA: Diagnosis present

## 2020-10-25 DIAGNOSIS — R809 Proteinuria, unspecified: Secondary | ICD-10-CM | POA: Diagnosis present

## 2020-10-25 DIAGNOSIS — E878 Other disorders of electrolyte and fluid balance, not elsewhere classified: Secondary | ICD-10-CM

## 2020-10-25 DIAGNOSIS — M0580 Other rheumatoid arthritis with rheumatoid factor of unspecified site: Secondary | ICD-10-CM

## 2020-10-25 DIAGNOSIS — M25561 Pain in right knee: Secondary | ICD-10-CM | POA: Diagnosis not present

## 2020-10-25 DIAGNOSIS — R7982 Elevated C-reactive protein (CRP): Secondary | ICD-10-CM

## 2020-10-25 DIAGNOSIS — M47816 Spondylosis without myelopathy or radiculopathy, lumbar region: Secondary | ICD-10-CM

## 2020-10-25 DIAGNOSIS — K5730 Diverticulosis of large intestine without perforation or abscess without bleeding: Secondary | ICD-10-CM | POA: Diagnosis present

## 2020-10-25 DIAGNOSIS — M5137 Other intervertebral disc degeneration, lumbosacral region: Secondary | ICD-10-CM | POA: Diagnosis present

## 2020-10-25 DIAGNOSIS — M48062 Spinal stenosis, lumbar region with neurogenic claudication: Secondary | ICD-10-CM | POA: Diagnosis present

## 2020-10-25 DIAGNOSIS — R748 Abnormal levels of other serum enzymes: Secondary | ICD-10-CM

## 2020-10-25 DIAGNOSIS — M792 Neuralgia and neuritis, unspecified: Secondary | ICD-10-CM

## 2020-10-25 DIAGNOSIS — Z79899 Other long term (current) drug therapy: Secondary | ICD-10-CM

## 2020-10-25 DIAGNOSIS — R937 Abnormal findings on diagnostic imaging of other parts of musculoskeletal system: Secondary | ICD-10-CM | POA: Diagnosis present

## 2020-10-25 DIAGNOSIS — M1612 Unilateral primary osteoarthritis, left hip: Secondary | ICD-10-CM | POA: Diagnosis present

## 2020-10-25 DIAGNOSIS — R7309 Other abnormal glucose: Secondary | ICD-10-CM

## 2020-10-25 DIAGNOSIS — R1032 Left lower quadrant pain: Secondary | ICD-10-CM | POA: Diagnosis present

## 2020-10-25 DIAGNOSIS — M25512 Pain in left shoulder: Secondary | ICD-10-CM | POA: Diagnosis present

## 2020-10-25 DIAGNOSIS — M25552 Pain in left hip: Secondary | ICD-10-CM | POA: Diagnosis not present

## 2020-10-25 DIAGNOSIS — I6523 Occlusion and stenosis of bilateral carotid arteries: Secondary | ICD-10-CM | POA: Diagnosis present

## 2020-10-25 DIAGNOSIS — Z96641 Presence of right artificial hip joint: Secondary | ICD-10-CM | POA: Diagnosis present

## 2020-10-25 DIAGNOSIS — E8809 Other disorders of plasma-protein metabolism, not elsewhere classified: Secondary | ICD-10-CM

## 2020-10-25 DIAGNOSIS — N281 Cyst of kidney, acquired: Secondary | ICD-10-CM

## 2020-10-25 DIAGNOSIS — M51379 Other intervertebral disc degeneration, lumbosacral region without mention of lumbar back pain or lower extremity pain: Secondary | ICD-10-CM

## 2020-10-25 DIAGNOSIS — M174 Other bilateral secondary osteoarthritis of knee: Secondary | ICD-10-CM

## 2020-10-25 DIAGNOSIS — Z7901 Long term (current) use of anticoagulants: Secondary | ICD-10-CM

## 2020-10-25 DIAGNOSIS — E790 Hyperuricemia without signs of inflammatory arthritis and tophaceous disease: Secondary | ICD-10-CM

## 2020-10-25 MED ORDER — MAGNESIUM OXIDE -MG SUPPLEMENT 500 MG PO CAPS: 1.0000 | ORAL_CAPSULE | Freq: Every day | ORAL | 2 refills | Status: DC

## 2020-10-25 MED ORDER — ERGOCALCIFEROL 1.25 MG (50000 UT) PO CAPS: 50000.0000 [IU] | ORAL_CAPSULE | ORAL | 0 refills | Status: DC

## 2020-10-25 MED ORDER — GABAPENTIN 300 MG PO CAPS: 300.0000 mg | ORAL_CAPSULE | Freq: Every day | ORAL | 2 refills | Status: DC

## 2020-10-25 MED ORDER — VITAMIN D3 125 MCG (5000 UT) PO CAPS: 1.0000 | ORAL_CAPSULE | Freq: Every day | ORAL | 5 refills | Status: DC

## 2020-10-25 MED ORDER — CALCIUM CARBONATE 1500 (600 CA) MG PO TABS: 600.0000 mg | ORAL_TABLET | Freq: Two times a day (BID) | ORAL | 2 refills | Status: DC

## 2020-10-25 MED ORDER — OXYCODONE HCL 5 MG PO TABS: 5.0000 mg | ORAL_TABLET | Freq: Four times a day (QID) | ORAL | 0 refills | Status: DC | PRN

## 2020-10-25 NOTE — Progress Notes (Signed)
Safety precautions to be maintained throughout the outpatient stay will include: orient to surroundings, keep bed in low position, maintain call bell within reach at all times, provide assistance with transfer out of bed and ambulation.  

## 2020-10-25 NOTE — Progress Notes (Signed)
PROVIDER NOTE: Information contained herein reflects review and annotations entered in association with encounter. Interpretation of such information and data should be left to medically-trained personnel. Information provided to patient can be located elsewhere in the medical record under "Patient Instructions". Document created using STT-dictation technology, any transcriptional errors that may result from process are unintentional.    Patient: [name]  Service Category: E/M  Provider: Gaspar Cola, MD  DOB: 1935/03/17  DOS: 10/25/2020  Specialty: Interventional Pain Management  MRN: [id number]  Setting: Ambulatory outpatient  PCP: Marguerita Merles, MD  Type: Established Patient    Referring Provider: Marguerita Merles, MD  Location: Office  Delivery: Face-to-face     Primary Reason(s) for Visit: Encounter for evaluation before starting new chronic pain management plan of care (Level of risk: moderate) CC: Hip Pain (left)  HPI  [name] is a 85 y.o. year old, male patient, who comes today for a follow-up evaluation to review the test results and decide on a treatment plan. He has Coronary artery disease; Essential hypertension; Hyperlipidemia; Shortness of breath; Hyponatremia; UTI (lower urinary tract infection); Acute on chronic systolic heart failure (HCC); COPD (chronic obstructive pulmonary disease) with chronic bronchitis ([place]); Obstructive sleep apnea; Alcohol abuse; GI bleed; Meningioma ([place]); Syncope; Hyperkalemia; Elevated troponin I level; Lower extremity pain, bilateral; STEMI (ST elevation myocardial infarction) [facility]); STEMI involving right coronary artery ([place]); Acute ST elevation myocardial infarction (STEMI) ([place]); Acute respiratory failure with hypoxia ([place]); Chronic pain syndrome; Pharmacologic therapy; Disorder of skeletal system; Problems influencing health status; Chronic anticoagulation (Plavix); Chronic hip pain (1ry area of Pain) (Left); Unilateral post-traumatic  osteoarthritis of hip (Left); Chronic knee pain (2ry area of Pain) (Bilateral); Secondary osteoarthritis of knee (Bilateral); Osteoarthritis of knees (Bilateral); Chronic constipation; Chronic shoulder pain (3ry area of Pain) (Bilateral); Grade 1 (3 mm) anterolisthesis of lumbar spine (L4-5) (stable); Chronic groin pain (Left); Other intervertebral disc degeneration, lumbar region; Neurogenic pain; Elevated C-reactive protein (CRP); Elevated sed rate; Vitamin D deficiency; Stage 3b chronic kidney disease ([place]); Proteinuria, unspecified; Elevated BUN; Elevated serum creatinine; Hypochloremia; Hypocalcemia; Hypoalbuminemia; Elevated alkaline phosphatase level; Elevated hemoglobin A1c; Elevated random blood glucose level; Anemia in stage 3b chronic kidney disease ([place]);  Klippel-Feil deformity at the C5-C7 levels; Carotid artery calcification, bilateral; History of 2.0 cm thyroid mass (Left lobe); Bilateral renal cysts; Lumbosacral spondylosis; Osteoarthritis of facet joint of lumbar spine; DDD (degenerative disc disease), lumbosacral; History of total hip replacement (Right); Osteoarthritis of hip (Left); Diverticulosis of sigmoid colon; Abnormal MRI, lumbar spine (10/07/2020); Lumbar central spinal stenosis with neurogenic claudication; and Lumbar foraminal stenosis on their problem list. His primarily concern today is the Hip Pain (left)  Pain Assessment: Location: Left Hip Radiating: denies Onset: More than a month ago Duration: Chronic pain Quality: Sharp, Stabbing Severity: 10-Worst pain ever/10 (subjective, self-reported pain score)  Timing: Intermittent Modifying factors: nothing BP: 135/69  HR: 69  [name] comes in today for a follow-up visit after his initial evaluation on 09/21/2020. Today we went over the results of his tests. These were explained in "Layman's terms". During today's appointment we went over my diagnostic impression, as well as the proposed treatment plan.  According to the  patient his worst/primary area of pain is that of the left hip which started secondary to a fracture that he suffered in 1960.  He was recently told that he also has a broken bone from an orthopedic procedure that he had on that hip.  He refers experiencing pain in the  groin area.  This pain is described as a stabbing sensation.  In addition to this the patient indicates having bilateral knee pain where he had some surgery done on the right knee.  He has been taking tramadol and Zanaflex for the pain.  The patient denies having tried any Neurontin or Lyrica.  However he does take Plavix secondary to an MI that he had in 2020.  The patient indicates also having some pain in both shoulders.  The patient indicates being able to tolerate gabapentin that we started him on and he is up to 300 mg at bedtime.  We will be somewhat limited as to how much we can give him because of his chronic kidney disease.  In considering the treatment plan options, [name] was reminded that I no longer take patients for medication management only. I asked him to let me know if he had no intention of taking advantage of the interventional therapies, so that we could make arrangements to provide this space to someone interested. I also made it clear that undergoing interventional therapies for the purpose of getting pain medications is very inappropriate on the part of a patient, and it will not be tolerated in this practice. This type of behavior would suggest true addiction and therefore it requires referral to an addiction specialist.   Further details on both, my assessment(s), as well as the proposed treatment plan, please see below.  Today I spent a lot of time going over the results of his lab work and x-rays were I have explained each one of them to them in great detail.  I have also talked to them about alternatives to treatment, expectations, and rules and regulations associated with the medications.  I have also  provided them with a prescription for oxycodone and refills on the Neurontin.  Controlled Substance Pharmacotherapy Assessment REMS (Risk Evaluation and Mitigation Strategy)  Analgesic: Tramadol 50 mg, 1 tab p.o. 4 times daily (200 mg/day of tramadol) (20 MME/day) MME/day: 20 mg/day  Pill Count: None expected due to no prior prescriptions written by our practice. Landis Martins, RN  10/25/2020  2:28 PM  Sign when Signing Visit Safety precautions to be maintained throughout the outpatient stay will include: orient to surroundings, keep bed in low position, maintain call bell within reach at all times, provide assistance with transfer out of bed and ambulation.    Pharmacokinetics: Liberation and absorption (onset of action): WNL Distribution (time to peak effect): WNL Metabolism and excretion (duration of action): WNL         Pharmacodynamics: Desired effects: Analgesia: Mr. Culotta reports >50% benefit. Functional ability: Patient reports that medication allows him to accomplish basic ADLs Clinically meaningful improvement in function (CMIF): Sustained CMIF goals met Perceived effectiveness: Described as relatively effective, allowing for increase in activities of daily living (ADL) Undesirable effects: Side-effects or Adverse reactions: None reported Monitoring: [place] PMP: PDMP reviewed during this encounter. Online review of the past 32-month period previously conducted. Not applicable at this point since we have not taken over the patient's medication management yet. List of other Serum/Urine Drug Screening Test(s):  Lab Results  Component Value Date   COCAINSCRNUR NEGATIVE 12/29/2011   COCAINSCRNUR NONE DETECTED 10/29/2010   COCAINSCRNUR NONE DETECTED 12/10/2009   COCAINSCRNUR NONE DETECTED 08/14/2009   COCAINSCRNUR NONE DETECTED 06/12/2009   COCAINSCRNUR NONE DETECTED 10/18/2008   COCAINSCRNUR NONE DETECTED 09/28/2008   COCAINSCRNUR NONE DETECTED 07/21/2008   THCU NEGATIVE  12/29/2011   THCU NONE DETECTED 10/29/2010  THCU POSITIVE (A) 12/10/2009   THCU POSITIVE (A) 08/14/2009   THCU POSITIVE (A) 06/12/2009   THCU NONE DETECTED 10/18/2008   THCU NONE DETECTED 09/28/2008   THCU NONE DETECTED 07/21/2008   ETH <5 08/30/2016   ETH (H) 03/27/2011    176        LOWEST DETECTABLE LIMIT FOR SERUM ALCOHOL IS 5 mg/dL FOR MEDICAL PURPOSES ONLY   ETH  10/29/2010    10        LOWEST DETECTABLE LIMIT FOR SERUM ALCOHOL IS 5 mg/dL FOR MEDICAL PURPOSES ONLY   ETH (H) 12/10/2009    252        LOWEST DETECTABLE LIMIT FOR SERUM ALCOHOL IS 5 mg/dL FOR MEDICAL PURPOSES ONLY   ETH  08/14/2009    <5        LOWEST DETECTABLE LIMIT FOR SERUM ALCOHOL IS 5 mg/dL FOR MEDICAL PURPOSES ONLY   ETH (H) 08/14/2009    102        LOWEST DETECTABLE LIMIT FOR SERUM ALCOHOL IS 5 mg/dL FOR MEDICAL PURPOSES ONLY   ETH (H) 06/12/2009    115        LOWEST DETECTABLE LIMIT FOR SERUM ALCOHOL IS 5 mg/dL FOR MEDICAL PURPOSES ONLY   ETH (H) 10/18/2008    14        LOWEST DETECTABLE LIMIT FOR SERUM ALCOHOL IS 11 mg/dL FOR MEDICAL PURPOSES ONLY   ETH (H) 09/28/2008    76        LOWEST DETECTABLE LIMIT FOR SERUM ALCOHOL IS 11 mg/dL FOR MEDICAL PURPOSES ONLY   ETH (H) 07/21/2008    196        LOWEST DETECTABLE LIMIT FOR SERUM ALCOHOL IS 11 mg/dL FOR MEDICAL PURPOSES ONLY   List of all UDS test(s) done:  Lab Results  Component Value Date   SUMMARY Note 09/20/2020   Last UDS on record: Summary  Date Value Ref Range Status  09/20/2020 Note  Final    Comment:    ==================================================================== Compliance Drug Analysis, Ur ==================================================================== Test                             Result       Flag       Units  Drug Present and Declared for Prescription Verification   Tramadol                       >4274        EXPECTED   ng/mg creat   O-Desmethyltramadol            >4274        EXPECTED    ng/mg creat   N-Desmethyltramadol            843          EXPECTED   ng/mg creat    Source of tramadol is a prescription medication. O-desmethyltramadol    and N-desmethyltramadol are expected metabolites of tramadol.    Quetiapine                     PRESENT      EXPECTED   Salicylate                     PRESENT      EXPECTED  Drug Present not Declared for Prescription Verification   Naproxen  PRESENT      UNEXPECTED  Drug Absent but Declared for Prescription Verification   Tizanidine                     Not Detected UNEXPECTED    Tizanidine, as indicated in the declared medication list, is not    always detected even when used as directed.  ==================================================================== Test                      Result    Flag   Units      Ref Range   Creatinine              117              mg/dL      >=20 ==================================================================== Declared Medications:  The flagging and interpretation on this report are based on the  following declared medications.  Unexpected results may arise from  inaccuracies in the declared medications.   **Note: The testing scope of this panel includes these medications:   Quetiapine (Seroquel)  Tramadol (Ultram)   **Note: The testing scope of this panel does not include small to  moderate amounts of these reported medications:   Aspirin  Tizanidine (Zanaflex)   **Note: The testing scope of this panel does not include the  following reported medications:   Albuterol  Amlodipine  Atorvastatin  Carvedilol  Clopidogrel (Plavix)  Fluticasone (Flonase)  Fluticasone (Trelegy)  Furosemide (Lasix)  Ipratropium (Atrovent)  Isosorbide (Imdur)  Nitroglycerin (Nitrostat)  Umeclidinium (Trelegy)  Vilanterol (Trelegy) ==================================================================== For clinical consultation, please call [phone]. ====================================================================    UDS interpretation: No unexpected findings.          Medication Assessment Form: Patient introduced to form today Treatment compliance: Treatment may start today if patient agrees with proposed plan. Evaluation of compliance is not applicable at this point Risk Assessment Profile: Aberrant behavior: See initial evaluations. None observed or detected today Comorbid factors increasing risk of overdose: See initial evaluation. No additional risks detected today Opioid risk tool (ORT):  Opioid Risk  10/25/2020  Alcohol 0  Illegal Drugs 0  Rx Drugs 0  Alcohol 3  Illegal Drugs 0  Rx Drugs 0  Age between 16-45 years  0  History of Preadolescent Sexual Abuse 0  Psychological Disease 0  Depression 0  Opioid Risk Tool Scoring 3  Opioid Risk Interpretation Low Risk    ORT Scoring interpretation table:  Score <3 = Low Risk for SUD  Score between 4-7 = Moderate Risk for SUD  Score >8 = High Risk for Opioid Abuse   Risk of substance use disorder (SUD): Moderate-to-High.  See above areas where the patient tested positive for THC on 3 different occasions.  In addition there are multiple test demonstrating high alcohol levels when tested.  Risk Mitigation Strategies:  Patient opioid safety counseling: Completed today. Counseling provided to patient as per "Patient Counseling Document". Document signed by patient, attesting to counseling and understanding Patient-Prescriber Agreement (PPA): Obtained today.  Controlled substance notification to other providers: Written and sent today.  Pharmacologic Plan: Today we may be taking over the patient's pharmacological regimen. See below.             Laboratory Chemistry Profile   Renal Lab Results  Component Value Date   BUN 32 (H) 09/20/2020   CREATININE 2.28 (H) 09/20/2020   LABCREA 124 05/29/2017   BCR 14 09/20/2020   GFRAA 30 (L) 09/20/2020  GFRNONAA 26 (L)  09/20/2020   SPECGRAV 1.020 06/14/2018   PHUR 5.5 06/14/2018   PROTEINUR 3+ (A) 06/14/2018     Electrolytes Lab Results  Component Value Date   NA 132 (L) 09/20/2020   K 3.9 09/20/2020   CL 94 (L) 09/20/2020   CALCIUM 8.3 (L) 09/20/2020   MG 2.0 09/20/2020     Hepatic Lab Results  Component Value Date   AST 14 09/20/2020   ALT 11 12/23/2019   ALBUMIN 3.5 (L) 09/20/2020   ALKPHOS 163 (H) 09/20/2020   AMYLASE 56 11/01/2010   LIPASE 48 07/31/2017     ID Lab Results  Component Value Date   HIV NON REACTIVE 12/11/2009   SARSCOV2NAA NEGATIVE 05/12/2020   MRSAPCR NEGATIVE 04/01/2019   HCVAB NEGATIVE 10/31/2010     Bone Lab Results  Component Value Date   25OHVITD1 16 (L) 09/20/2020   25OHVITD2 <1.0 09/20/2020   25OHVITD3 15 09/20/2020     Endocrine Lab Results  Component Value Date   GLUCOSE 102 (H) 09/20/2020   GLUCOSEU Negative 06/14/2018   HGBA1C 6.2 (H) 04/01/2019   TSH 2.854 09/11/2016     Neuropathy Lab Results  Component Value Date   VITAMINB12 330 09/20/2020   HGBA1C 6.2 (H) 04/01/2019   HIV NON REACTIVE 12/11/2009     CNS No results found for: COLORCSF, APPEARCSF, RBCCOUNTCSF, WBCCSF, POLYSCSF, LYMPHSCSF, EOSCSF, PROTEINCSF, GLUCCSF, JCVIRUS, CSFOLI, IGGCSF, LABACHR, ACETBL, LABACHR, ACETBL   Inflammation (CRP: Acute  ESR: Chronic) Lab Results  Component Value Date   CRP 39 (H) 09/20/2020   ESRSEDRATE >140 (H) 09/20/2020   LATICACIDVEN 1.0 10/30/2010     Rheumatology No results found for: RF, ANA, LABURIC, URICUR, LYMEIGGIGMAB, LYMEABIGMQN, HLAB27   Coagulation Lab Results  Component Value Date   INR 1.1 09/20/2020   LABPROT 11.2 09/20/2020   APTT 31 09/20/2020   PLT 303 09/20/2020     Cardiovascular Lab Results  Component Value Date   BNP 182.0 (H) 04/12/2019   CKTOTAL 351 (H) 05/30/2014   CKMB 2.3 05/30/2014   TROPONINI 0.03 (HH) 04/12/2019   HGB 11.7 (L) 12/23/2019   HCT 36.0 (L) 12/23/2019     Screening Lab Results   Component Value Date   SARSCOV2NAA NEGATIVE 05/12/2020   MRSAPCR NEGATIVE 04/01/2019   HCVAB NEGATIVE 10/31/2010   HIV NON REACTIVE 12/11/2009     Cancer No results found for: CEA, CA125, LABCA2   Allergens No results found for: ALMOND, APPLE, ASPARAGUS, AVOCADO, BANANA, BARLEY, BASIL, BAYLEAF, GREENBEAN, LIMABEAN, WHITEBEAN, BEEFIGE, REDBEET, BLUEBERRY, BROCCOLI, CABBAGE, MELON, CARROT, CASEIN, CASHEWNUT, CAULIFLOWER, CELERY     Note: Lab results reviewed.  Recent Diagnostic Imaging Review  Cervical Imaging: Cervical CT wo contrast: Results for orders placed during the hospital encounter of 06/12/09 CT Cervical Spine Wo Contrast  Narrative Clinical Data:  Syncopal episode today.  Multiple facial and forehead lacerations.  CT HEAD WITHOUT CONTRAST CT CERVICAL SPINE WITHOUT CONTRAST  Technique:  Multidetector CT imaging of the head and cervical spine was performed following the standard protocol without intravenous contrast.  Multiplanar CT image reconstructions of the cervical spine were also generated.  Comparison: None.  CT HEAD  Findings: Mildly enlarged ventricles and subarachnoid spaces.  No skull fracture, intracranial hemorrhage, mass lesions, CT evidence of acute infarction or paranasal sinus air-fluid levels.  A small triangular shaped calcific density is demonstrate lateral to the right nasal bone.  Several additional tiny adjacent calcific densities are noted.  There is also a mildly comminuted anterior  nasal bone fracture with posterior displacement and angulation of the fragments.  Mild deviation of the midportion of the nasal septum to the right.  IMPRESSION:  1.  Mildly comminuted anterior nasal bone fracture. 2.  Triangular shaped fracture fragment or foreign body lateral to the posterior aspect of the nasal bone on the right.  There are several additional tiny fracture fragments or foreign bodies in that region as well. 3.  Mild atrophy. 4.  No  skull fracture or intracranial hemorrhage.  CT CERVICAL SPINE  Findings: Anterior bone fusion at the C5-C7 levels with a narrow AP diameter of the vertebral bodies, compatible with a Klippel-Feil deformity.  Anterior and posterior spur formation at the other levels of the cervical spine.  Mild levoconvex cervical scoliosis. No prevertebral soft tissue swelling, fractures or subluxations seen.  Enlargement of the posterior aspect of the left lobe of the thyroid gland.  This appears to be due to a 2.0 cm oval mass.  No enlarged lymph nodes seen.  Dense bilateral carotid artery calcifications.  IMPRESSION:  1.  No fracture or subluxation. 2.  Multilevel degenerative changes. 3.  Klippel-Feil deformity at the C5-C7 levels. 4.  2.0 cm left lobe thyroid mass.  Correlation with elective thyroid ultrasound is recommended. 5.  Dense bilateral carotid artery atheromatous calcifications.  Provider: Wardell Heath  Lumbosacral Imaging: Lumbar MR wo contrast: Results for orders placed during the hospital encounter of 10/07/20 MR LUMBAR SPINE WO CONTRAST  Narrative CLINICAL DATA:  Lumbosacral osteoarthritis. Low back pain for over 6 weeks. Left hip pain extending down the leg.  EXAM: MRI LUMBAR SPINE WITHOUT CONTRAST  TECHNIQUE: Multiplanar, multisequence MR imaging of the lumbar spine was performed. No intravenous contrast was administered.  COMPARISON:  05/29/2017  FINDINGS: Segmentation:  5 lumbar type vertebrae  Alignment:  Grade 1 anterolisthesis at L4-5, chronic.  Vertebrae:  No fracture, evidence of discitis, or bone lesion.  Conus medullaris and cauda equina: Conus extends to the L2 level. Conus has a normal appearance. There is cauda equina redundancy due to the degree of spinal stenosis, tortuosity greatest at the L1-2 level  Paraspinal and other soft tissues: Bilateral renal cystic intensities, partially covered. Trabeculated bladder.  Disc levels:  Degenerative  changes are superimposed on a congenitally narrow spinal canal from short pedicles.  T12- L1: Spondylosis with bridging osteophyte.  No impingement  L1-L2: Spondylosis.  Mild disc narrowing.  No impingement  L2-L3: Disc narrowing and bulging. Facet osteoarthritis with spurring and ligamentum flavum thickening. Advanced and compressive spinal stenosis, mildly progressed. Moderate bilateral foraminal narrowing.  L3-L4: Disc narrowing and bulging. Degenerative facet spurring on both sides. Advanced spinal stenosis. Moderate left more than right foraminal narrowing. Degenerative ganglion from the inter spinous space leftward, interval.  L4-L5: Facet osteoarthritis with spurring and ligamentum flavum thickening. Grade 1 anterolisthesis. The disc is narrowed and bulging and there is advanced spinal stenosis. Mild right and more moderate left foraminal narrowing.  L5-S1:Mild facet spurring and disc narrowing.  No impingement.  IMPRESSION: 1. Diffuse disc and facet degeneration superimposed on a congenitally narrow spinal canal from short pedicles. Mild progression when compared to 2018. 2. Compressive spinal stenosis and moderate foraminal narrowings from L2-3 to L4-5.   Electronically Signed By: Monte Fantasia M.D. On: 10/08/2020 05:57   Results for orders placed during the hospital encounter of 05/27/17 MR LUMBAR SPINE WO CONTRAST  Narrative CLINICAL DATA:  Left buttock, lateral hip, and thigh pain radiating to the knee.  EXAM: MRI LUMBAR SPINE WITHOUT  CONTRAST  TECHNIQUE: Multiplanar, multisequence MR imaging of the lumbar spine was performed. No intravenous contrast was administered.  COMPARISON:  CT abdomen 12/01/2016  FINDINGS: Body habitus reduces diagnostic sensitivity and specificity.  Segmentation: The lowest lumbar type non-rib-bearing vertebra is labeled as L5.  Alignment: 4 mm degenerative anterolisthesis at L4-5, no pars defects.  Vertebrae:   Congenitally short pedicles in the lumbar spine.  Disc desiccation at all levels between L2 and L5.  Degenerative facet edema bilaterally at the L4-5 level.  Conus medullaris: Extends to the upper L2 level and appears normal.  Paraspinal and other soft tissues: Fluid signal intensity lesions associated with the right kidney are faintly seen and most compatible with cysts. At least 1 similar lesion on the left.  Interspinous edema at the L4-5 and L5-S1 levels on image 9/4.  Disc levels:  L1-2:  No impingement, mild disc bulge.  L2-3: Prominent central narrowing of the thecal sac with moderate bilateral foraminal stenosis, mild-to-moderate bilateral subarticular lateral recess stenosis, and mild displacement of the right L2 nerve in the lateral extraforaminal space due to congenitally short pedicles, disc bulge, facet arthropathy, and ligamentum flavum redundancy. Cross-sectional area of the thecal sac 0.6 cm^2.  L3-4: Prominent central narrowing of the thecal sac with moderate bilateral foraminal stenosis, mild to moderate bilateral subarticular lateral recess stenosis, and mild displacement of both L3 nerves in the lateral extraforaminal space due to congenitally short pedicles, disc bulge, intervertebral spurring, facet arthropathy, and ligamentum flavum redundancy. Cross-sectional area of the thecal sac 0.6 cm^2.  L4-5: Prominent central narrowing of the thecal sac with moderate bilateral foraminal stenosis, moderate bilateral subarticular lateral recess stenosis, and mild displacement of both L4 nerves in the lateral extraforaminal space due to congenitally short pedicles, disc bulge, intervertebral spurring, disc uncovering, and facet arthropathy. Cross-sectional area of the thecal sac 0.6 cm^ 2.  L5-S1: Mild left and borderline right foraminal stenosis due to facet arthropathy, disc bulge, and mild intervertebral spurring.  IMPRESSION: 1. Congenitally short pedicles,  lumbar spondylosis, and degenerative disc disease causing prominent impingement at L2- 3, L3-4, and L4-5; and mild impingement at L5-S1, as detailed above. 2. 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 3. Degenerative facet edema bilaterally at L4-5. 4. 4 mm of degenerative anterolisthesis at L4-5. 5. Suspected bilateral renal cysts. These are only partially seen on today's exam but were also present on the CT scan from December 2017.   Electronically Signed By: Van Clines M.D. On: 05/29/2017 19:23  Results for orders placed in visit on 03/06/01 MR Lumbar Spine Wo Contrast  Narrative FINDINGS CLINICAL DATA:  LOW BACK AND BILATERAL HIP PAIN, RIGHT GREATER THAN LEFT.   PAIN EXTENDS INTO THE RIGHT THIGH POSTERIORLY. MRI OF THE LUMBAR SPINE WITHOUT CONTRAST PERFORMED IN THE OPEN MAGNET: THE SCAN DEMONSTRATES THAT THE PATIENT HAS WHAT APPEARS TO BE A CONGENITALLY NARROW SPINAL CANAL AT THE L2-3 LEVEL THROUGH THE L4-5 LEVEL.  THERE IS PROMINENT FAT IN THE EPIDURAL SPACE.  IN ADDITION, AT THE L4-5 LEVEL, THERE IS MARKED DEGENERATIVE FACET JOINT ARTHRITIS WITH FACET JOINT HYPERTROPHY AND HYPERTROPHY OF THE LIGAMENT OF FLAVA WHICH CONTRIBUTES TO NARROWING OF THE SPINAL CANAL.  THE AP DIAMETER IS NARROWED MOST SEVERELY AT L3-4 AS INDICATED ON THE SAGITTAL IMAGES.  THERE IS SLIGHTLY LESSER NARROWING OF THE AP DIAMETER AT L2-3 AND L3-4.  THERE IS BILATERAL FORAMINAL STENOSIS AT L4-5 DUE TO THE FACET JOINT HYPERTROPHY AND VERY SLIGHT BULGING OF THE DISC AS WELL AS SOME  SPURRING OF THE INFERIOR END PLATE OF L4 INTO THE NEURAL FORAMINA.   THE L4-5 NERVE ROOTS COULD BE COMPRESSED ON EACH SIDE. THERE IS NO SIGNIFICANT INTERVERTEBRAL DISC SPACE NARROWING AND THERE IS NO EVIDENCE OF SIGNIFICANT BULGING OR DISC HERNIATION.  THE CONUS MEDULLARIS APPEARS NORMAL. THE L5-S1 LEVEL SHOWS NO EVIDENCE OF SIGNIFICANT STENOSIS OR DISC DISEASE. IMPRESSION 1)    MULTILEVEL  CONGENITAL SPINAL STENOSIS, MILD AT L2-3 AND L3-4, AND MODERATE AT L4-5.  THE DEGREE OF STENOSIS AT L4-5 IS GREATER THAN AT THE OTHER LEVELS DUE TO MARKED FACET JOINT ARTHRITIS BILATERALLY WITH HYPERTROPHY OF THE LIGAMENT OF FLAVA AS WELL. 2)   BILATERAL FORAMINAL STENOSIS AT L4-5 MAY IMPINGE UPON THE EXITING L4 NERVE ROOTS. 3)   NO DISCRETE DISC HERNIATIONS.  Lumbar DG 2-3 views: Results for orders placed during the hospital encounter of 05/12/20 DG Lumbar Spine 2-3 Views  Narrative CLINICAL DATA:  Low back pain following fall yesterday, initial encounter  EXAM: LUMBAR SPINE - 3 VIEW  COMPARISON:  09/02/2008  FINDINGS: Five lumbar type vertebral bodies are well visualized. Vertebral body height is well maintained. Mild osteophytic changes are seen. Degenerative anterolisthesis of L4 on L5 is noted. Aortic calcifications are seen without aneurysmal dilatation.  IMPRESSION: Degenerative changes without acute abnormality.   Electronically Signed By: Inez Catalina M.D. On: 05/12/2020 13:30        Lumbar DG (Complete) 4+V: Results for orders placed during the hospital encounter of 05/27/06 DG Lumbar Spine Complete  Narrative Clinical Data:  Low back pain.  LUMBAR SPINE - 5 VIEW 05/27/2006:  Comparison:  None.  Findings:  Five nonrib-bearing lumbar vertebrae demonstrate anatomic alignment. No fractures are identified. The disc spaces are well-preserved. There are lateral osteophytes arising at the L2-L3 and L3-L4 levels. The oblique views demonstrate no pars defect. Facet degenerative changes are present bilaterally at L4-L5 and L5-S1. Visualized sacroiliac joints appear intact. Note is made of atherosclerotic calcification of the abdominal aorta without evidence of aneurysm. Spondylosis is noted involving the visualized lower thoracic spine.  IMPRESSION:  No acute skeletal abnormalities. Facet degenerative changes of L4-L5 and L5-S1. Lower thoracic and lumbar  spondylosis as detailed above.  Provider: Kennieth Francois  Lumbar DG Bending views: Results for orders placed during the hospital encounter of 09/20/20 DG Lumbar Spine Complete W/Bend  Narrative CLINICAL DATA:  Low back pain with radicular symptoms  EXAM: LUMBAR SPINE - COMPLETE WITH BENDING VIEWS  COMPARISON:  May 12, 2020  FINDINGS: Frontal, neutral lateral, flexion lateral, extension lateral, spot lumbosacral lateral, and bilateral oblique views were obtained. There are 5 non-rib-bearing lumbar type vertebral bodies. There is no appreciable fracture. On neutral lateral imaging, there is no spondylolisthesis. With flexion, there is 3 mm of anterolisthesis of L4 on L5. No other spondylolisthesis on flexion imaging. No spondylolisthesis on extension lateral imaging. There is fairly mild disc space narrowing at L4-5 and L5-S1. Other disc spaces appear unremarkable. There are anterior osteophytes at all levels. There is facet osteoarthritic change at L3-4, L4-5, and L5-S1 bilaterally. There is aortic and iliac artery atherosclerosis. Total hip replacement noted on the right.  IMPRESSION: No fracture. No spondylolisthesis with neutral lateral and extension lateral imaging. With flexion, there is 3 mm of anterolisthesis of L4 on L5, likely due to ligamentous instability in this area. No other spondylolisthesis with flexion. Osteoarthritic change in the facets at L3-4, L4-5, and L5-S1 bilaterally. Fairly mild disc space narrowing at L4-5 and L5-S1.  Aortic Atherosclerosis (ICD10-I70.0).   Electronically  Signed By: Lowella Grip III M.D. On: 09/21/2020 09:08  Hip Imaging: Hip-L CT wo contrast: Results for orders placed during the hospital encounter of 05/15/17 CT HIP LEFT WO CONTRAST  Narrative CLINICAL DATA:  Left hip pain. History of prior hip fracture and surgery.  EXAM: CT OF THE LEFT HIP WITHOUT CONTRAST  TECHNIQUE: Multidetector CT imaging of the left hip was  performed according to the standard protocol. Multiplanar CT image reconstructions were also generated.  COMPARISON:  12/01/2016  FINDINGS: Bones/Joint/Cartilage  Left hip screw and lateral plate fixator, this captures the medial margin of the femoral neck cortex on a chronic basis. No acute regional fracture.  Severe degenerative hip arthropathy noted with severe loss of articular cartilage, a 1.4 cm subcortical cystic lesion in the left femoral head on image 67/4, and marginal spurring.  Ligaments  Suboptimally assessed by CT.  Muscles and Tendons  Fatty atrophy of part of the vastus lateralis as on image 105/3.  Soft tissues  Sigmoid colon diverticulosis.  Penile prosthesis noted, relatively empty reservoir in the left hemipelvis.  Dense atherosclerotic calcification in the left proximal SFA, may be occluding the SFA.  IMPRESSION: 1. Severe degenerative arthritis of the left hip. A left hip screws in place, slightly capturing the medial cortical margin of the femoral neck on a chronic basis. 2. Penile prosthesis noted. 3. Sigmoid colon diverticulosis. 4. Advanced atherosclerosis.   Electronically Signed By: Van Clines M.D. On: 05/15/2017 09:31  Hip-L DG 2-3 views: Results for orders placed during the hospital encounter of 05/12/20 DG Hip Unilat W or Wo Pelvis 2-3 Views Left  Narrative CLINICAL DATA:  Posterior left hip pain  EXAM: DG HIP (WITH OR WITHOUT PELVIS) 2-3V LEFT  COMPARISON:  August 05, 2017  FINDINGS: The patient is status post IM nail fixation of the left femur. There is advanced left hip osteoarthritis with superior joint space loss and marginal osteophyte formation. There is diffuse osteopenia which limits evaluation. No displaced fracture is seen. The patient is status post right total hip arthroplasty. Dense vascular calcifications are noted.  IMPRESSION: Status post ORIF with IM nail fixation of the proximal left  femur. No definite acute osseous abnormality, however limited due to diffuse osteopenia. If there is high clinical suspicion for occult hip fracture or the patient refuses to weightbear, consider further evaluation cross-sectional imaging.   Electronically Signed By: Prudencio Pair M.D. On: 05/12/2020 13:25  Knee Imaging: Knee-R DG 4 views: Results for orders placed during the hospital encounter of 05/12/20 DG Knee Complete 4 Views Right  Narrative CLINICAL DATA:  Knee pain post fall, bilateral knee pain  EXAM: RIGHT KNEE - COMPLETE 4+ VIEW  COMPARISON:  None.  FINDINGS: Diffuse mild edematous swelling with a moderate joint effusion. Severe tricompartmental degenerative change with apparent medial femorotibial joint space narrowing incompletely assessed on nonweightbearing radiograph. No acute bony abnormality. Specifically, no fracture, subluxation, or dislocation. Corticated loose bodies seen along the anterior and posterior joint line. Bidirectional patellar enthesophytes are noted. Extensive vascular calcium with a fractured vascular stent in the posterior soft tissues.  IMPRESSION: 1. No acute bony abnormality. Diffuse mild edematous swelling and a moderate joint effusion. 2. Severe tricompartmental degenerative change. 3. Corticated loose bodies along the anterior and posterior joint line. 4. Extensive vascular calcium with a fractured vascular stent in the posterior soft tissues.   Electronically Signed By: Lovena Le M.D. On: 05/12/2020 01:36  Knee-L DG 4 views: Results for orders placed during the hospital encounter of  05/12/20 DG Knee Complete 4 Views Left  Narrative CLINICAL DATA:  Fall, knee pain  EXAM: LEFT KNEE - COMPLETE 4+ VIEW  COMPARISON:  Radiograph 05/28/2017  FINDINGS: Minimal soft tissue swelling anteriorly. Trace joint effusion. No acute osseous abnormality or suspicious osseous lesion. Mild-to-moderate tricompartmental  osteoarthrosis most prominent about the patellofemoral articulation. Large bidirectional patellar enthesophytes are present. Extensive vascular calcification in the posterior soft tissues.  IMPRESSION: 1. Minimal prepatellar swelling and trace effusion. No acute osseous abnormality. 2. Mild to moderate tricompartmental osteoarthrosis most prominent about the patellofemoral articulation. 3. Atherosclerosis.   Electronically Signed By: Lovena Le M.D. On: 05/12/2020 01:38  Ankle Imaging: Ankle-L DG Complete: Results for orders placed during the hospital encounter of 05/12/20 DG Ankle Complete Left  Narrative CLINICAL DATA:  Left ankle pain.  Fall.  EXAM: LEFT ANKLE COMPLETE - 3+ VIEW  COMPARISON:  None.  FINDINGS: Distal interosseous bony bridging. No acute fracture or subluxation. Mid foot and heel spurring. Atherosclerotic calcifications.  IMPRESSION: No acute finding.   Electronically Signed By: Monte Fantasia M.D. On: 05/13/2020 05:41  Wrist Imaging: Wrist-R DG Complete: Results for orders placed during the hospital encounter of 01/29/16 DG Wrist Complete Right  Narrative CLINICAL DATA:  Pain following fall  EXAM: RIGHT WRIST - COMPLETE 3+ VIEW  COMPARISON:  None.  FINDINGS: Frontal, oblique, lateral, and ulnar deviation scaphoid images were obtained. There is extensive soft tissue swelling over the palmar aspect of the hand. There is no demonstrable acute fracture or dislocation. There is extensive osteoarthritic change in the radial carpal joint as well as in the scaphotrapezial and first carpal -metacarpal joints. Osteoarthritic change is also noted in all MCP joints. There are subchondral cystic changes in the distal ulna. There is bony overgrowth along the lateral aspect of the distal ulna. No bony destruction or erosion.  IMPRESSION: Extensive soft tissue swelling along the palmar aspect of the hand. Extensive multifocal osteoarthritic  change. No acute fracture or dislocation evident.   Electronically Signed By: Lowella Grip III M.D. On: 01/29/2016 08:23  Complexity Note: Imaging results reviewed. Results shared with Mr. Gum, using Layman's terms.                        Meds   Current Outpatient Medications:  .  ADULT ASPIRIN REGIMEN 81 MG EC tablet, TAKE 1 TABLET BY MOUTH DAILY, Disp: 30 tablet, Rfl: 2 .  albuterol (VENTOLIN HFA) 108 (90 Base) MCG/ACT inhaler, USE 2 PUFFS EVERY 4 HOURS AS NEEDED, Disp: 18 g, Rfl: 5 .  amLODipine (NORVASC) 5 MG tablet, Take 1 tablet (5 mg total) by mouth daily., Disp: 30 tablet, Rfl: 5 .  atorvastatin (LIPITOR) 80 MG tablet, Take 0.5 tablets (40 mg total) by mouth daily., Disp: 30 tablet, Rfl: 4 .  carvedilol (COREG) 6.25 MG tablet, Take 1 tablet (6.25 mg total) by mouth 2 (two) times daily., Disp: 180 tablet, Rfl: 3 .  clopidogrel (PLAVIX) 75 MG tablet, Take 1 tablet (75 mg total) by mouth daily., Disp: 30 tablet, Rfl: 0 .  fluticasone (FLONASE) 50 MCG/ACT nasal spray, Place 2 sprays into both nostrils daily., Disp: , Rfl:  .  furosemide (LASIX) 40 MG tablet, Take 1 tablet (40 mg total) by mouth daily. And additional $RemoveBefore'40mg'[id number]$  in the afternoon as needed for weight gain and leg edema., Disp: 100 tablet, Rfl: 2 .  gabapentin (NEURONTIN) 300 MG capsule, Take 1 capsule (300 mg total) by mouth at bedtime. Follow written titration  schedule., Disp: 30 capsule, Rfl: 2 .  ipratropium (ATROVENT) 0.03 % nasal spray, Place 2 sprays into both nostrils 2 (two) times daily. , Disp: , Rfl:  .  isosorbide mononitrate (IMDUR) 30 MG 24 hr tablet, TAKE 1 TABLET BY MOUTH DAILY, Disp: 30 tablet, Rfl: 12 .  nitroGLYCERIN (NITROSTAT) 0.4 MG SL tablet, Take 0.4 mg by mouth every 5 (five) minutes x 3 doses as needed for chest pain. As needed for chest pain, Disp: , Rfl:  .  QUEtiapine (SEROQUEL) 50 MG tablet, Take 50 mg by mouth at bedtime., Disp: , Rfl:  .  tiZANidine (ZANAFLEX) 4 MG capsule, Take 4 mg by mouth  daily., Disp: , Rfl:  .  traMADol (ULTRAM) 50 MG tablet, Take by mouth every 6 (six) hours as needed., Disp: , Rfl:  .  TRELEGY ELLIPTA 200-62.5-25 MCG/INH AEPB, Inhale 1 puff into the lungs daily. , Disp: , Rfl:  .  calcium carbonate (OSCAL) 1500 (600 Ca) MG TABS tablet, Take 1 tablet (1,500 mg total) by mouth 2 (two) times daily with a meal., Disp: 60 tablet, Rfl: 2 .  Cholecalciferol (VITAMIN D3) 125 MCG (5000 UT) CAPS, Take 1 capsule (5,000 Units total) by mouth daily with breakfast. Take along with calcium and magnesium., Disp: 30 capsule, Rfl: 5 .  ergocalciferol (VITAMIN D2) 1.25 MG (50000 UT) capsule, Take 1 capsule (50,000 Units total) by mouth 2 (two) times a week. X 6 weeks., Disp: 12 capsule, Rfl: 0 .  Magnesium Oxide 500 MG CAPS, Take 1 capsule (500 mg total) by mouth daily., Disp: 30 capsule, Rfl: 2 .  oxyCODONE (OXY IR/ROXICODONE) 5 MG immediate release tablet, Take 1 tablet (5 mg total) by mouth every 6 (six) hours as needed for severe pain. Must last 30 days., Disp: 120 tablet, Rfl: 0  ROS  Constitutional: Denies any fever or chills Gastrointestinal: No reported hemesis, hematochezia, vomiting, or acute GI distress Musculoskeletal: Denies any acute onset joint swelling, redness, loss of ROM, or weakness Neurological: No reported episodes of acute onset apraxia, aphasia, dysarthria, agnosia, amnesia, paralysis, loss of coordination, or loss of consciousness  Allergies  Mr. Valent is allergic to chlorthalidone, lisinopril, other, and benazepril-hydrochlorothiazide.  PFSH  Drug: Mr. Schroepfer  reports no history of drug use. Alcohol:  reports previous alcohol use. Tobacco:  reports that he has quit smoking. His smoking use included cigarettes. He has a 50.00 pack-year smoking history. He has never used smokeless tobacco. Medical:  has a past medical history of Alcoholism ([place]), Asthma, Bipolar affective disorder ([place]), Chronic combined systolic and diastolic CHF (congestive heart  failure) ([place]), CKD (chronic kidney disease), stage III ([place]), COPD (chronic obstructive pulmonary disease) ([place]), Coronary artery disease, Degenerative joint disease, Dyspnea, Essential hypertension, Hyperlipidemia, Hypertension, Ischemic cardiomyopathy, OSA (obstructive sleep apnea), Prostate CA ([place]), PVD (peripheral vascular disease) ([place]), and Tobacco abuse. Surgical: Mr. Bennis  has a past surgical history that includes Total hip arthroplasty; Coronary/Graft Acute MI Revascularization (N/A, 04/01/2019); CORONARY ANGIOGRAPHY (N/A, 04/01/2019); Cardiac catheterization; and Coronary angioplasty with stent. Family: family history includes Heart attack in his father and mother; Hyperlipidemia in his father and mother; Hypertension in his father and mother.  Constitutional Exam  General appearance: Well nourished, well developed, and well hydrated. In no apparent acute distress Vitals:   10/25/20 1423  BP: 135/69  Pulse: 69  Resp: 18  SpO2: 98%  Weight: 223 lb (101.2 kg)  Height: _0  (1.702 m)   BMI Assessment: Estimated body mass index is 34.93 kg/m as  calculated from the following:   Height as of this encounter: $RemoveBeforeD'5\' 7"'AGaYOhEphOxppy$  (1.702 m).   Weight as of this encounter: 223 lb (101.2 kg).  BMI interpretation table: BMI level Category Range association with higher incidence of chronic pain  <18 kg/m2 Underweight   18.5-24.9 kg/m2 Ideal body weight   25-29.9 kg/m2 Overweight Increased incidence by 20%  30-34.9 kg/m2 Obese (Class I) Increased incidence by 68%  35-39.9 kg/m2 Severe obesity (Class II) Increased incidence by 136%  >40 kg/m2 Extreme obesity (Class III) Increased incidence by 254%   Patient's current BMI Ideal Body weight  Body mass index is 34.93 kg/m. Ideal body weight: 66.1 kg (145 lb 11.6 oz) Adjusted ideal body weight: 80.1 kg (176 lb 10.1 oz)   BMI Readings from Last 4 Encounters:  10/25/20 34.93 kg/m  09/20/20 35.24 kg/m  06/08/20 45.60 kg/m  05/12/20 42.29 kg/m   Wt  Readings from Last 4 Encounters:  10/25/20 223 lb (101.2 kg)  09/20/20 225 lb (102.1 kg)  06/08/20 291 lb 2 oz (132.1 kg)  05/12/20 270 lb (122.5 kg)   Psych/Mental status: Alert, oriented x 3 (person, place, & time)       Eyes: PERLA Respiratory: No evidence of acute respiratory distress  Assessment & Plan  Primary Diagnosis & Pertinent Problem List: The primary encounter diagnosis was Chronic pain syndrome. Diagnoses of Chronic hip pain (Left), Chronic groin pain (Left), Chronic knee pain (Bilateral), Chronic shoulder pain (Bilateral), Chronic anticoagulation (Plavix), Elevated C-reactive protein (CRP), Elevated sed rate, Vitamin D deficiency, Pharmacologic therapy, Neurogenic pain, Stage 3b chronic kidney disease ([place]), Proteinuria, unspecified type, Elevated BUN, Elevated serum creatinine, Hypochloremia, Hypocalcemia, Hypoalbuminemia, Elevated alkaline phosphatase level, Elevated hemoglobin A1c, Elevated random blood glucose level, Anemia in stage 3b chronic kidney disease (HCC),  Klippel-Feil deformity at the C5-C7 levels, Carotid artery calcification, bilateral, History of 2.0 cm thyroid mass (Left lobe), Bilateral renal cysts, Spondylosis of lumbosacral region, unspecified spinal osteoarthritis complication status, Osteoarthritis of facet joint of lumbar spine, DDD (degenerative disc disease), lumbosacral, History of total hip replacement (Right), Osteoarthritis of hip (Left), Diverticulosis of sigmoid colon, Secondary osteoarthritis of knee (Bilateral), Abnormal MRI, lumbar spine (10/07/2020), Lumbar central spinal stenosis with neurogenic claudication, and Lumbar foraminal stenosis were also pertinent to this visit.  Visit Diagnosis: 1. Chronic pain syndrome   2. Chronic hip pain (Left)   3. Chronic groin pain (Left)   4. Chronic knee pain (Bilateral)   5. Chronic shoulder pain (Bilateral)   6. Chronic anticoagulation (Plavix)   7. Elevated C-reactive protein (CRP)   8. Elevated sed  rate   9. Vitamin D deficiency   10. Pharmacologic therapy   11. Neurogenic pain   12. Stage 3b chronic kidney disease ([place])   13. Proteinuria, unspecified type   14. Elevated BUN   15. Elevated serum creatinine   16. Hypochloremia   17. Hypocalcemia   18. Hypoalbuminemia   19. Elevated alkaline phosphatase level   20. Elevated hemoglobin A1c   21. Elevated random blood glucose level   22. Anemia in stage 3b chronic kidney disease ([place])   23.  Klippel-Feil deformity at the C5-C7 levels   24. Carotid artery calcification, bilateral   25. History of 2.0 cm thyroid mass (Left lobe)   26. Bilateral renal cysts   27. Spondylosis of lumbosacral region, unspecified spinal osteoarthritis complication status   28. Osteoarthritis of facet joint of lumbar spine   29. DDD (degenerative disc disease), lumbosacral   30. History of total hip  replacement (Right)   31. Osteoarthritis of hip (Left)   32. Diverticulosis of sigmoid colon   33. Secondary osteoarthritis of knee (Bilateral)   34. Abnormal MRI, lumbar spine (10/07/2020)   35. Lumbar central spinal stenosis with neurogenic claudication   36. Lumbar foraminal stenosis    Problems updated and reviewed during this visit: Problem   Klippel-Feil deformity at the C5-C7 levels  Lumbosacral Spondylosis  Osteoarthritis of Facet Joint of Lumbar Spine  Ddd (Degenerative Disc Disease), Lumbosacral  History of total hip replacement (Right)  Osteoarthritis of hip (Left)   Severe degenerative arthritis of the left hip. A left hip screws in place, slightly capturing the medial cortical margin of the femoral neck on a chronic basis.   Abnormal MRI, lumbar spine (10/07/2020)   FINDINGS: Alignment:  Grade 1 anterolisthesis at L4-5, chronic. Paraspinal and other soft tissues: Bilateral renal cystic intensities, partially covered. Trabeculated bladder.  Disc levels: Degenerative changes are superimposed on a congenitally narrow spinal canal from  short pedicles. T12- L1: Spondylosis with bridging osteophyte.  No impingement L1-2: Spondylosis.  Mild disc narrowing.  No impingement L2-3: Disc narrowing and bulging. Facet osteoarthritis with spurring and ligamentum flavum thickening. Advanced and compressive spinal stenosis, mildly progressed. Moderate bilateral foraminal narrowing. L3-4: Disc narrowing and bulging. Degenerative facet spurring on both sides. Advanced spinal stenosis. Moderate left more than right foraminal narrowing. Degenerative ganglion from the inter spinous space leftward, interval. L4-5: Facet osteoarthritis with spurring and ligamentum flavum thickening. Grade 1 anterolisthesis. The disc is narrowed and bulging and there is advanced spinal stenosis. Mild right and more moderate left foraminal narrowing. L5-S1: Mild facet spurring and disc narrowing.  No impingement.  IMPRESSION: 1. Diffuse disc and facet degeneration superimposed on a congenitally narrow spinal canal from short pedicles. Mild progression when compared to 2018. 2. Compressive spinal stenosis and moderate foraminal narrowings from L2-3 to L4-5.   Lumbar central spinal stenosis with neurogenic claudication   Levels: Congenitally narrow spinal canal from short pedicles. L2-3: Advanced and compressive spinal stenosis L3-4: Advanced spinal stenosis L4-5: Advanced spinal stenosis  IMPRESSION: Compressive spinal stenosis from L2-3 to L4-5.   Lumbar Foraminal Stenosis   Levels: L2-3: Moderate bilateral foraminal narrowing. L3-4: Moderate left more than right foraminal narrowing. L4-5: Mild right and more moderate left foraminal narrowing.   Chronic hip pain (1ry area of Pain) (Left)   Status post ORIF with IM nail fixation of the proximal left femur. No definite acute osseous abnormality, however limited due to diffuse osteopenia.   Chronic knee pain (2ry area of Pain) (Bilateral)   IMPRESSION (DG Right knee): (05/12/2020) 1. No acute bony  abnormality. Diffuse mild edematous swelling and a moderate joint effusion. 2. Severe tricompartmental degenerative change. 3. Corticated loose bodies along the anterior and posterior joint line. 4. Extensive vascular calcium with a fractured vascular stent in the posterior soft tissues.  IMPRESSION (DG Left knee): (05/12/2020) 1. Minimal prepatellar swelling and trace effusion. No acute osseous abnormality. 2. Mild to moderate tricompartmental osteoarthrosis most prominent about the patellofemoral articulation. 3. Atherosclerosis.   Secondary osteoarthritis of knee (Bilateral)   IMPRESSION (DG Right knee): (05/12/2020) 1. Diffuse mild edematous swelling and a moderate joint effusion. 2. Severe tricompartmental degenerative change. 3. Corticated loose bodies along the anterior and posterior joint line.  IMPRESSION (DG Left knee): (05/12/2020) 1. Minimal prepatellar swelling and trace effusion. 2. Mild to moderate tricompartmental osteoarthrosis most prominent about the patellofemoral articulation.   Chronic shoulder pain (3ry area of Pain) (Bilateral)  Grade  1 (3 mm) anterolisthesis of lumbar spine (L4-5) (stable)  Other Intervertebral Disc Degeneration, Lumbar Region  Stage 3b Chronic Kidney Disease (Hcc)  Proteinuria, Unspecified  Elevated Bun  Elevated Serum Creatinine  Hypochloremia  Hypocalcemia  Hypoalbuminemia  Elevated Alkaline Phosphatase Level  Elevated Hemoglobin A1c  Elevated Random Blood Glucose Level  Anemia in Stage 3b Chronic Kidney Disease ([facility])  Carotid Artery Calcification, Bilateral  History of 2.0 cm thyroid mass (Left lobe)  Bilateral Renal Cysts  Diverticulosis of Sigmoid Colon    Plan of Care  Pharmacotherapy (Medications Ordered): Meds ordered this encounter  Medications  . oxyCODONE (OXY IR/ROXICODONE) 5 MG immediate release tablet    Sig: Take 1 tablet (5 mg total) by mouth every 6 (six) hours as needed for severe pain. Must last 30 days.     Dispense:  120 tablet    Refill:  0    Chronic Pain: STOP Act (Not applicable) Fill 1 day early if closed on refill date. Avoid benzodiazepines within 8 hours of opioids  . ergocalciferol (VITAMIN D2) 1.25 MG (50000 UT) capsule    Sig: Take 1 capsule (50,000 Units total) by mouth 2 (two) times a week. X 6 weeks.    Dispense:  12 capsule    Refill:  0    Fill one day early if pharmacy is closed on scheduled refill date. May substitute for generic, or similar, if available.  . Cholecalciferol (VITAMIN D3) 125 MCG (5000 UT) CAPS    Sig: Take 1 capsule (5,000 Units total) by mouth daily with breakfast. Take along with calcium and magnesium.    Dispense:  30 capsule    Refill:  5    Fill one day early if pharmacy is closed on scheduled refill date. May substitute for generic, or similar, if available.  . Magnesium Oxide 500 MG CAPS    Sig: Take 1 capsule (500 mg total) by mouth daily.    Dispense:  30 capsule    Refill:  2    Fill one day early if pharmacy is closed on scheduled refill date. May substitute for generic if available.  . calcium carbonate (OSCAL) 1500 (600 Ca) MG TABS tablet    Sig: Take 1 tablet (1,500 mg total) by mouth 2 (two) times daily with a meal.    Dispense:  60 tablet    Refill:  2    Fill one day early if pharmacy is closed on scheduled refill date. May substitute for generic, or similar, if available.  . gabapentin (NEURONTIN) 300 MG capsule    Sig: Take 1 capsule (300 mg total) by mouth at bedtime. Follow written titration schedule.    Dispense:  30 capsule    Refill:  2    Fill one day early if pharmacy is closed on scheduled refill date. May substitute for generic if available.   Procedure Orders    No procedure(s) ordered today    Lab Orders     Rheumatoid factor     ANA w/Reflex if Positive     Uric acid, random urine     Uric acid Imaging Orders  No imaging studies ordered today   Referral Orders  No referral(s) requested today     Pharmacological management options:  Opioid Analgesics: We'll take over management today. See above orders Membrane stabilizer: Options discussed, including a trial. Muscle relaxant: We have discussed the possibility of a trial NSAID: Trial discussed. Other analgesic(s): To be determined at a later time    Interventional  management options: Planned, scheduled, and/or pending:    NOTE: PLAVIX Anticoagulation (Stop: 7-10 days  Restart: 2 hours) Pending to order bilateral shoulder x-rays.  Possible need for neurosurgical evaluation.  We will also consider nerve conduction testing of the lower extremities.   Considering:   Diagnostic left L2-3 LESI #1  Diagnostic left L3-4 LESI #1  Diagnostic left L4-5 LESI #1  Diagnostic bilateral L2 TFESI #1  Diagnostic bilateral L3 TFESI #1  Diagnostic bilateral L4 TFESI #1  Diagnostic bilateral lumbar facet block #1  Diagnostic bilateral femoral nerve and obturator nerve block #1  Diagnostic bilateral IA knee joint injection (steroid) #1  Possible series of bilateral IA Hyalgan knee injections  Diagnostic bilateral genicular nerve block #1    PRN Procedures:   None at this time    Provider-requested follow-up: Return for Procedure (no sedation): (L) L2-3 LESI #1, (Blood Thinner Protocol). Recent Visits Date Type Provider Dept  10/25/20 Office Visit Milinda Pointer, MD [facility]  09/20/20 Office Visit Milinda Pointer, MD [facility]  Showing recent visits within past 90 days and meeting all other requirements Future Appointments Date Type Provider Dept  11/15/20 Appointment Milinda Pointer, MD [facility]  Showing future appointments within next 90 days and meeting all other requirements  Primary Care Physician: Marguerita Merles, MD Note by: Gaspar Cola, MD Date: 10/25/2020; Time: 7:26 AM

## 2020-10-25 NOTE — Patient Instructions (Addendum)
____________________________________________________________________________________________  CBD (cannabidiol) WARNING  Applicable to: All individuals currently taking or considering taking CBD (cannabidiol) and, more important, all patients taking opioid analgesic controlled substances (pain medication). (Example: oxycodone; oxymorphone; hydrocodone; hydromorphone; morphine; methadone; tramadol; tapentadol; fentanyl; buprenorphine; butorphanol; dextromethorphan; meperidine; codeine; etc.)  Legal status: CBD remains a Schedule I drug prohibited for any use. CBD is illegal with one exception. In the [place], CBD has a limited Food and Drug Administration (FDA) approval for the treatment of two specific types of epilepsy disorders. Only one CBD product has been approved by the FDA for this purpose: "Epidiolex". FDA is aware that some companies are marketing products containing cannabis and cannabis-derived compounds in ways that violate the Federal Food, Drug and Cosmetic Act (FD&C Act) and that may put the health and safety of consumers at risk. The FDA, a Federal agency, has not enforced the CBD status since 2018.   Legality: Some manufacturers ship CBD products nationally, which is illegal. Often such products are sold online and are therefore available throughout the country. CBD is openly sold in head shops and health food stores in some states where such sales have not been explicitly legalized. Selling unapproved products with unsubstantiated therapeutic claims is not only a violation of the law, but also can put patients at risk, as these products have not been proven to be safe or effective. Federal illegality makes it difficult to conduct research on CBD.  Reference: "FDA Regulation of Cannabis and Cannabis-Derived Products, Including Cannabidiol (CBD)" -  https://www.fda.gov/news-events/public-health-focus/fda-regulation-cannabis-and-cannabis-derived-products-including-cannabidiol-cbd  Warning: CBD is not FDA approved and has not undergo the same manufacturing controls as prescription drugs.  This means that the purity and safety of available CBD may be questionable. Most of the time, despite manufacturer's claims, it is contaminated with THC (delta-9-tetrahydrocannabinol - the chemical in marijuana responsible for the "HIGH").  When this is the case, the THC contaminant will trigger a positive urine drug screen (UDS) test for Marijuana (carboxy-THC). Because a positive UDS for any illicit substance is a violation of our medication agreement, your opioid analgesics (pain medicine) may be permanently discontinued.  MORE ABOUT CBD  General Information: CBD  is a derivative of the Marijuana (cannabis sativa) plant discovered in 1940. It is one of the 113 identified substances found in Marijuana. It accounts for up to 40% of the plant's extract. As of 2018, preliminary clinical studies on CBD included research for the treatment of anxiety, movement disorders, and pain. CBD is available and consumed in multiple forms, including inhalation of smoke or vapor, as an aerosol spray, and by mouth. It may be supplied as an oil containing CBD, capsules, dried cannabis, or as a liquid solution. CBD is thought not to be as psychoactive as THC (delta-9-tetrahydrocannabinol - the chemical in marijuana responsible for the "HIGH"). Studies suggest that CBD may interact with different biological target receptors in the body, including cannabinoid and other neurotransmitter receptors. As of 2018 the mechanism of action for its biological effects has not been determined.  Side-effects  Adverse reactions: Dry mouth, diarrhea, decreased appetite, fatigue, drowsiness, malaise, weakness, sleep disturbances, and others.  Drug interactions: CBC may interact with other medications  such as blood-thinners. (Last update: 07/24/2020) ____________________________________________________________________________________________   ____________________________________________________________________________________________  Medication Rules  Purpose: To inform patients, and their family members, of our rules and regulations.  Applies to: All patients receiving prescriptions (written or electronic).  Pharmacy of record: Pharmacy where electronic prescriptions will be sent. If written prescriptions are taken to a different pharmacy, please inform   the nursing staff. The pharmacy listed in the electronic medical record should be the one where you would like electronic prescriptions to be sent.  Electronic prescriptions: In compliance with the [place] Strengthen Opioid Misuse Prevention (STOP) Act of 2017 (Session Law 2017-74/H243), effective December 18, 2018, all controlled substances must be electronically prescribed. Calling prescriptions to the pharmacy will cease to exist.  Prescription refills: Only during scheduled appointments. Applies to all prescriptions.  NOTE: The following applies primarily to controlled substances (Opioid* Pain Medications).   Type of encounter (visit): For patients receiving controlled substances, face-to-face visits are required. (Not an option or up to the patient.)  Patient's responsibilities: 1. Pain Pills: Bring all pain pills to every appointment (except for procedure appointments). 2. Pill Bottles: Bring pills in original pharmacy bottle. Always bring the newest bottle. Bring bottle, even if empty. 3. Medication refills: You are responsible for knowing and keeping track of what medications you take and those you need refilled. The day before your appointment: write a list of all prescriptions that need to be refilled. The day of the appointment: give the list to the admitting nurse. Prescriptions will be written only during  appointments. No prescriptions will be written on procedure days. If you forget a medication: it will not be "Called in", "Faxed", or "electronically sent". You will need to get another appointment to get these prescribed. No early refills. Do not call asking to have your prescription filled early. 4. Prescription Accuracy: You are responsible for carefully inspecting your prescriptions before leaving our office. Have the discharge nurse carefully go over each prescription with you, before taking them home. Make sure that your name is accurately spelled, that your address is correct. Check the name and dose of your medication to make sure it is accurate. Check the number of pills, and the written instructions to make sure they are clear and accurate. Make sure that you are given enough medication to last until your next medication refill appointment. 5. Taking Medication: Take medication as prescribed. When it comes to controlled substances, taking less pills or less frequently than prescribed is permitted and encouraged. Never take more pills than instructed. Never take medication more frequently than prescribed.  6. Inform other Doctors: Always inform, all of your healthcare providers, of all the medications you take. 7. Pain Medication from other Providers: You are not allowed to accept any additional pain medication from any other Doctor or Healthcare provider. There are two exceptions to this rule. (see below) In the event that you require additional pain medication, you are responsible for notifying us, as stated below. 8. Medication Agreement: You are responsible for carefully reading and following our Medication Agreement. This must be signed before receiving any prescriptions from our practice. Safely store a copy of your signed Agreement. Violations to the Agreement will result in no further prescriptions. (Additional copies of our Medication Agreement are available upon request.) 9. Laws, Rules,  & Regulations: All patients are expected to follow all Federal and State Laws, Statutes, Rules, & Regulations. Ignorance of the Laws does not constitute a valid excuse.  10. Illegal drugs and Controlled Substances: The use of illegal substances (including, but not limited to marijuana and its derivatives) and/or the illegal use of any controlled substances is strictly prohibited. Violation of this rule may result in the immediate and permanent discontinuation of any and all prescriptions being written by our practice. The use of any illegal substances is prohibited. 11. Adopted CDC guidelines & recommendations: Target dosing   levels will be at or below 60 MME/day. Use of benzodiazepines** is not recommended.  Exceptions: There are only two exceptions to the rule of not receiving pain medications from other Healthcare Providers. 1. Exception #1 (Emergencies): In the event of an emergency (i.e.: accident requiring emergency care), you are allowed to receive additional pain medication. However, you are responsible for: As soon as you are able, call our office [phone], at any time of the day or night, and leave a message stating your name, the date and nature of the emergency, and the name and dose of the medication prescribed. In the event that your call is answered by a member of our staff, make sure to document and save the date, time, and the name of the person that took your information.  2. Exception #2 (Planned Surgery): In the event that you are scheduled by another doctor or dentist to have any type of surgery or procedure, you are allowed (for a period no longer than 30 days), to receive additional pain medication, for the acute post-op pain. However, in this case, you are responsible for picking up a copy of our "Post-op Pain Management for Surgeons" handout, and giving it to your surgeon or dentist. This document is available at our office, and does not require an appointment to obtain it. Simply  go to our office during business hours (Monday-Thursday from 8:00 AM to 4:00 PM) (Friday 8:00 AM to 12:00 Noon) or if you have a scheduled appointment with us, prior to your surgery, and ask for it by name. In addition, you are responsible for: calling our office [phone], at any time of the day or night, and leaving a message stating your name, name of your surgeon, type of surgery, and date of procedure or surgery. Failure to comply with your responsibilities may result in termination of therapy involving the controlled substances.  *Opioid medications include: morphine, codeine, oxycodone, oxymorphone, hydrocodone, hydromorphone, meperidine, tramadol, tapentadol, buprenorphine, fentanyl, methadone. **Benzodiazepine medications include: diazepam (Valium), alprazolam (Xanax), clonazepam (Klonopine), lorazepam (Ativan), clorazepate (Tranxene), chlordiazepoxide (Librium), estazolam (Prosom), oxazepam (Serax), temazepam (Restoril), triazolam (Halcion) (Last updated: 08/24/2020) ____________________________________________________________________________________________   ____________________________________________________________________________________________  Medication Recommendations and Reminders  Applies to: All patients receiving prescriptions (written and/or electronic).  Medication Rules & Regulations: These rules and regulations exist for your safety and that of others. They are not flexible and neither are we. Dismissing or ignoring them will be considered "non-compliance" with medication therapy, resulting in complete and irreversible termination of such therapy. (See document titled "Medication Rules" for more details.) In all conscience, because of safety reasons, we cannot continue providing a therapy where the patient does not follow instructions.  Pharmacy of record:   Definition: This is the pharmacy where your electronic prescriptions will be sent.   We do not endorse any  particular pharmacy, however, we have experienced problems with [facility] not securing enough medication supply for the community.  We do not restrict you in your choice of pharmacy. However, once we write for your prescriptions, we will NOT be re-sending more prescriptions to fix restricted supply problems created by your pharmacy, or your insurance.   The pharmacy listed in the electronic medical record should be the one where you want electronic prescriptions to be sent.  If you choose to change pharmacy, simply notify our nursing staff.  Recommendations:  Keep all of your pain medications in a safe place, under lock and key, even if you live alone. We will NOT replace lost, stolen, or   damaged medication.  After you fill your prescription, take 1 week's worth of pills and put them away in a safe place. You should keep a separate, properly labeled bottle for this purpose. The remainder should be kept in the original bottle. Use this as your primary supply, until it runs out. Once it's gone, then you know that you have 1 week's worth of medicine, and it is time to come in for a prescription refill. If you do this correctly, it is unlikely that you will ever run out of medicine.  To make sure that the above recommendation works, it is very important that you make sure your medication refill appointments are scheduled at least 1 week before you run out of medicine. To do this in an effective manner, make sure that you do not leave the office without scheduling your next medication management appointment. Always ask the nursing staff to show you in your prescription , when your medication will be running out. Then arrange for the receptionist to get you a return appointment, at least 7 days before you run out of medicine. Do not wait until you have 1 or 2 pills left, to come in. This is very poor planning and does not take into consideration that we may need to cancel appointments due to bad weather,  sickness, or emergencies affecting our staff.  DO NOT ACCEPT A "Partial Fill": If for any reason your pharmacy does not have enough pills/tablets to completely fill or refill your prescription, do not allow for a "partial fill". The law allows the pharmacy to complete that prescription within 72 hours, without requiring a new prescription. If they do not fill the rest of your prescription within those 72 hours, you will need a separate prescription to fill the remaining amount, which we will NOT provide. If the reason for the partial fill is your insurance, you will need to talk to the pharmacist about payment alternatives for the remaining tablets, but again, DO NOT ACCEPT A PARTIAL FILL, unless you can trust your pharmacist to obtain the remainder of the pills within 72 hours.  Prescription refills and/or changes in medication(s):   Prescription refills, and/or changes in dose or medication, will be conducted only during scheduled medication management appointments. (Applies to both, written and electronic prescriptions.)  No refills on procedure days. No medication will be changed or started on procedure days. No changes, adjustments, and/or refills will be conducted on a procedure day. Doing so will interfere with the diagnostic portion of the procedure.  No phone refills. No medications will be "called into the pharmacy".  No Fax refills.  No weekend refills.  No Holliday refills.  No after hours refills.  Remember:  Business hours are:  Monday to Thursday 8:00 AM to 4:00 PM Provider's Schedule: Milinda Pointer, MD - Appointments are:  Medication management: Monday and Wednesday 8:00 AM to 4:00 PM Procedure day: Tuesday and Thursday 7:30 AM to 4:00 PM Gillis Santa, MD - Appointments are:  Medication management: Tuesday and Thursday 8:00 AM to 4:00 PM Procedure day: Monday and Wednesday 7:30 AM to 4:00 PM (Last update:  07/07/2020) ____________________________________________________________________________________________   ____________________________________________________________________________________________  Preparing for your procedure (without sedation)  Procedure appointments are limited to planned procedures: . No Prescription Refills. . No disability issues will be discussed. . No medication changes will be discussed.  Instructions: . Oral Intake: Do not eat or drink anything for at least 6 hours prior to your procedure. (Exception: Blood Pressure Medication. See below.) .  Transportation: Unless otherwise stated by your physician, you may drive yourself after the procedure. . Blood Pressure Medicine: Do not forget to take your blood pressure medicine with a sip of water the morning of the procedure. If your Diastolic (lower reading)is above 100 mmHg, elective cases will be cancelled/rescheduled. . Blood thinners: These will need to be stopped for procedures. Notify our staff if you are taking any blood thinners. Depending on which one you take, there will be specific instructions on how and when to stop it. . Diabetics on insulin: Notify the staff so that you can be scheduled 1st case in the morning. If your diabetes requires high dose insulin, take only  of your normal insulin dose the morning of the procedure and notify the staff that you have done so. . Preventing infections: Shower with an antibacterial soap the morning of your procedure.  . Build-up your immune system: Take 1000 mg of Vitamin C with every meal (3 times a day) the day prior to your procedure. Marland Kitchen Antibiotics: Inform the staff if you have a condition or reason that requires you to take antibiotics before dental procedures. . Pregnancy: If you are pregnant, call and cancel the procedure. . Sickness: If you have a cold, fever, or any active infections, call and cancel the procedure. . Arrival: You must be in the facility at  least 30 minutes prior to your scheduled procedure. . Children: Do not bring any children with you. . Dress appropriately: Bring dark clothing that you would not mind if they get stained. . Valuables: Do not bring any jewelry or valuables.  Reasons to call and reschedule or cancel your procedure: (Following these recommendations will minimize the risk of a serious complication.) . Surgeries: Avoid having procedures within 2 weeks of any surgery. (Avoid for 2 weeks before or after any surgery). . Flu Shots: Avoid having procedures within 2 weeks of a flu shots or . (Avoid for 2 weeks before or after immunizations). . Barium: Avoid having a procedure within 7-10 days after having had a radiological study involving the use of radiological contrast. (Myelograms, Barium swallow or enema study). . Heart attacks: Avoid any elective procedures or surgeries for the initial 6 months after a "Myocardial Infarction" (Heart Attack). . Blood thinners: It is imperative that you stop these medications before procedures. Let us know if you if you take any blood thinner.  . Infection: Avoid procedures during or within two weeks of an infection (including chest colds or gastrointestinal problems). Symptoms associated with infections include: Localized redness, fever, chills, night sweats or profuse sweating, burning sensation when voiding, cough, congestion, stuffiness, runny nose, sore throat, diarrhea, nausea, vomiting, cold or Flu symptoms, recent or current infections. It is specially important if the infection is over the area that we intend to treat. Marland Kitchen Heart and lung problems: Symptoms that may suggest an active cardiopulmonary problem include: cough, chest pain, breathing difficulties or shortness of breath, dizziness, ankle swelling, uncontrolled high or unusually low blood pressure, and/or palpitations. If you are experiencing any of these symptoms, cancel your procedure and contact your primary care physician for  an evaluation.  Remember:  Regular Business hours are:  Monday to Thursday 8:00 AM to 4:00 PM  Provider's Schedule: Milinda Pointer, MD:  Procedure days: Tuesday and Thursday 7:30 AM to 4:00 PM  Gillis Santa, MD:  Procedure days: Monday and Wednesday 7:30 AM to 4:00 PM ____________________________________________________________________________________________   ____________________________________________________________________________________________  General Risks and Possible Complications  Patient Responsibilities: It is important  that you read this as it is part of your informed consent. It is our duty to inform you of the risks and possible complications associated with treatments offered to you. It is your responsibility as a patient to read this and to ask questions about anything that is not clear or that you believe was not covered in this document.  Patient's Rights: You have the right to refuse treatment. You also have the right to change your mind, even after initially having agreed to have the treatment done. However, under this last option, if you wait until the last second to change your mind, you may be charged for the materials used up to that point.  Introduction: Medicine is not an Chief Strategy Officer. Everything in Medicine, including the lack of treatment(s), carries the potential for danger, harm, or loss (which is by definition: Risk). In Medicine, a complication is a secondary problem, condition, or disease that can aggravate an already existing one. All treatments carry the risk of possible complications. The fact that a side effects or complications occurs, does not imply that the treatment was conducted incorrectly. It must be clearly understood that these can happen even when everything is done following the highest safety standards.  No treatment: You can choose not to proceed with the proposed treatment alternative. The "PRO(s)" would include: avoiding the risk  of complications associated with the therapy. The "CON(s)" would include: not getting any of the treatment benefits. These benefits fall under one of three categories: diagnostic; therapeutic; and/or palliative. Diagnostic benefits include: getting information which can ultimately lead to improvement of the disease or symptom(s). Therapeutic benefits are those associated with the successful treatment of the disease. Finally, palliative benefits are those related to the decrease of the primary symptoms, without necessarily curing the condition (example: decreasing the pain from a flare-up of a chronic condition, such as incurable terminal cancer).  General Risks and Complications: These are associated to most interventional treatments. They can occur alone, or in combination. They fall under one of the following six (6) categories: no benefit or worsening of symptoms; bleeding; infection; nerve damage; allergic reactions; and/or death. 1. No benefits or worsening of symptoms: In Medicine there are no guarantees, only probabilities. No healthcare provider can ever guarantee that a medical treatment will work, they can only state the probability that it may. Furthermore, there is always the possibility that the condition may worsen, either directly, or indirectly, as a consequence of the treatment. 2. Bleeding: This is more common if the patient is taking a blood thinner, either prescription or over the counter (example: Goody Powders, Fish oil, Aspirin, Garlic, etc.), or if suffering a condition associated with impaired coagulation (example: Hemophilia, cirrhosis of the liver, low platelet counts, etc.). However, even if you do not have one on these, it can still happen. If you have any of these conditions, or take one of these drugs, make sure to notify your treating physician. 3. Infection: This is more common in patients with a compromised immune system, either due to disease (example: diabetes, cancer, human  immunodeficiency virus [HIV], etc.), or due to medications or treatments (example: therapies used to treat cancer and rheumatological diseases). However, even if you do not have one on these, it can still happen. If you have any of these conditions, or take one of these drugs, make sure to notify your treating physician. 4. Nerve Damage: This is more common when the treatment is an invasive one, but it can also happen with the  use of medications, such as those used in the treatment of cancer. The damage can occur to small secondary nerves, or to large primary ones, such as those in the spinal cord and brain. This damage may be temporary or permanent and it may lead to impairments that can range from temporary numbness to permanent paralysis and/or brain death. 5. Allergic Reactions: Any time a substance or material comes in contact with our body, there is the possibility of an allergic reaction. These can range from a mild skin rash (contact dermatitis) to a severe systemic reaction (anaphylactic reaction), which can result in death. 6. Death: In general, any medical intervention can result in death, most of the time due to an unforeseen complication. ____________________________________________________________________________________________  ____________________________________________________________________________________________  Blood Thinners  IMPORTANT NOTICE:  If you take any of these, make sure to notify the nursing staff.  Failure to do so may result in injury.  Recommended time intervals to stop and restart blood-thinners, before & after invasive procedures  Generic Name Brand Name Stop Time. Must be stopped at least this long before procedures. After procedures, wait at least this long before re-starting.  Abciximab Reopro 15 days 2 hrs  Alteplase Activase 10 days 10 days  Anagrelide Agrylin    Apixaban Eliquis 3 days 6 hrs  Cilostazol Pletal 3 days 5 hrs  Clopidogrel Plavix 7-10  days 2 hrs  Dabigatran Pradaxa 5 days 6 hrs  Dalteparin Fragmin 24 hours 4 hrs  Dipyridamole Aggrenox 11days 2 hrs  Edoxaban Lixiana; Savaysa 3 days 2 hrs  Enoxaparin  Lovenox 24 hours 4 hrs  Eptifibatide Integrillin 8 hours 2 hrs  Fondaparinux  Arixtra 72 hours 12 hrs  Prasugrel Effient 7-10 days 6 hrs  Reteplase Retavase 10 days 10 days  Rivaroxaban Xarelto 3 days 6 hrs  Ticagrelor Brilinta 5-7 days 6 hrs  Ticlopidine Ticlid 10-14 days 2 hrs  Tinzaparin Innohep 24 hours 4 hrs  Tirofiban Aggrastat 8 hours 2 hrs  Warfarin Coumadin 5 days 2 hrs   Other medications with blood-thinning effects  Product indications Generic (Brand) names Note  Cholesterol Lipitor Stop 4 days before procedure  Blood thinner (injectable) Heparin (LMW or LMWH Heparin) Stop 24 hours before procedure  Cancer Ibrutinib (Imbruvica) Stop 7 days before procedure  Malaria/Rheumatoid Hydroxychloroquine (Plaquenil) Stop 11 days before procedure  Thrombolytics  10 days before or after procedures   Over-the-counter (OTC) Products with blood-thinning effects  Product Common names Stop Time  Aspirin > 325 mg Goody Powders, Excedrin, etc. 11 days  Aspirin ? 81 mg  7 days  Fish oil  4 days  Garlic supplements  7 days  Ginkgo biloba  36 hours  Ginseng  24 hours  NSAIDs Ibuprofen, Naprosyn, etc. 3 days  Vitamin E  4 days   ____________________________________________________________________________________________

## 2020-10-26 DIAGNOSIS — E8809 Other disorders of plasma-protein metabolism, not elsewhere classified: Secondary | ICD-10-CM | POA: Insufficient documentation

## 2020-10-26 DIAGNOSIS — R7989 Other specified abnormal findings of blood chemistry: Secondary | ICD-10-CM | POA: Insufficient documentation

## 2020-10-26 DIAGNOSIS — R799 Abnormal finding of blood chemistry, unspecified: Secondary | ICD-10-CM | POA: Insufficient documentation

## 2020-10-26 DIAGNOSIS — N1832 Chronic kidney disease, stage 3b: Secondary | ICD-10-CM | POA: Insufficient documentation

## 2020-10-26 DIAGNOSIS — K5730 Diverticulosis of large intestine without perforation or abscess without bleeding: Secondary | ICD-10-CM | POA: Insufficient documentation

## 2020-10-26 DIAGNOSIS — E079 Disorder of thyroid, unspecified: Secondary | ICD-10-CM | POA: Insufficient documentation

## 2020-10-26 DIAGNOSIS — Q761 Klippel-Feil syndrome: Secondary | ICD-10-CM | POA: Insufficient documentation

## 2020-10-26 DIAGNOSIS — I6523 Occlusion and stenosis of bilateral carotid arteries: Secondary | ICD-10-CM | POA: Insufficient documentation

## 2020-10-26 DIAGNOSIS — R937 Abnormal findings on diagnostic imaging of other parts of musculoskeletal system: Secondary | ICD-10-CM | POA: Insufficient documentation

## 2020-10-26 DIAGNOSIS — M1612 Unilateral primary osteoarthritis, left hip: Secondary | ICD-10-CM | POA: Insufficient documentation

## 2020-10-26 DIAGNOSIS — N184 Chronic kidney disease, stage 4 (severe): Secondary | ICD-10-CM | POA: Insufficient documentation

## 2020-10-26 DIAGNOSIS — E878 Other disorders of electrolyte and fluid balance, not elsewhere classified: Secondary | ICD-10-CM | POA: Insufficient documentation

## 2020-10-26 DIAGNOSIS — M48061 Spinal stenosis, lumbar region without neurogenic claudication: Secondary | ICD-10-CM | POA: Insufficient documentation

## 2020-10-26 DIAGNOSIS — M48062 Spinal stenosis, lumbar region with neurogenic claudication: Secondary | ICD-10-CM | POA: Insufficient documentation

## 2020-10-26 DIAGNOSIS — M47816 Spondylosis without myelopathy or radiculopathy, lumbar region: Secondary | ICD-10-CM | POA: Insufficient documentation

## 2020-10-26 DIAGNOSIS — D631 Anemia in chronic kidney disease: Secondary | ICD-10-CM | POA: Insufficient documentation

## 2020-10-26 DIAGNOSIS — N281 Cyst of kidney, acquired: Secondary | ICD-10-CM | POA: Insufficient documentation

## 2020-10-26 DIAGNOSIS — M5137 Other intervertebral disc degeneration, lumbosacral region: Secondary | ICD-10-CM | POA: Insufficient documentation

## 2020-10-26 DIAGNOSIS — Z96641 Presence of right artificial hip joint: Secondary | ICD-10-CM | POA: Insufficient documentation

## 2020-10-26 DIAGNOSIS — R748 Abnormal levels of other serum enzymes: Secondary | ICD-10-CM | POA: Insufficient documentation

## 2020-10-26 DIAGNOSIS — R7309 Other abnormal glucose: Secondary | ICD-10-CM | POA: Insufficient documentation

## 2020-10-26 DIAGNOSIS — M47817 Spondylosis without myelopathy or radiculopathy, lumbosacral region: Secondary | ICD-10-CM | POA: Insufficient documentation

## 2020-10-26 DIAGNOSIS — R809 Proteinuria, unspecified: Secondary | ICD-10-CM | POA: Insufficient documentation

## 2020-10-26 LAB — ANA W/REFLEX IF POSITIVE: Anti Nuclear Antibody (ANA): NEGATIVE

## 2020-10-26 LAB — URIC ACID, RANDOM URINE: Uric Acid, Ur: 16.9 mg/dL

## 2020-10-26 LAB — RHEUMATOID FACTOR: Rheumatoid fact SerPl-aCnc: 21.2 IU/mL — ABNORMAL HIGH (ref 0.0–13.9)

## 2020-10-26 LAB — URIC ACID: Uric Acid: 12.6 mg/dL — ABNORMAL HIGH (ref 3.8–8.4)

## 2020-10-27 ENCOUNTER — Telehealth: Payer: Self-pay | Admitting: Cardiovascular Disease

## 2020-10-27 NOTE — Telephone Encounter (Signed)
   [facility] Pre-operative Risk Assessment    HEARTCARE STAFF: - Please ensure there is not already an duplicate clearance open for this procedure. - Under Visit Info/Reason for Call, type in Other and utilize the format Clearance MM/DD/YY or Clearance TBD. Do not use dashes or single digits. - If request is for dental extraction, please clarify the # of teeth to be extracted.  Request for surgical clearance:  1. What type of surgery is being performed? Lumber epidural for chronic pain  2. When is this surgery scheduled? TBD  3. What type of clearance is required (medical clearance vs. Pharmacy clearance to hold med vs. Both)? pharm  4. Are there any medications that need to be held prior to surgery and how long? Plavix 7 days prior  5. Practice name and name of physician performing surgery? [facility] Dr. Dossie Arbour  6. What is the office phone number? [phone]   7.   What is the office fax number? [phone]  8.   Anesthesia type (None, local, MAC, general) ? monitored sedation    Marykay Lex 10/27/2020, 8:56 AM  _________________________________________________________________   (provider comments below)

## 2020-10-27 NOTE — Telephone Encounter (Signed)
   Primary Cardiologist: Kathlyn Sacramento, MD  Chart reviewed as part of pre-operative protocol coverage.   Per previous recommendation by Dr. Fletcher Anon and given lack of interval change in cardiac history, patient can hold plavix 5-7 days prior to his upcoming spinal ejection. Plavix should be restarted as soon as he is cleared to do so by his surgeon.   I will route this recommendation to the requesting party via Epic fax function and remove from pre-op pool.  Please call with questions.  Abigail Butts, PA-C 10/27/2020, 1:47 PM

## 2020-10-27 NOTE — Progress Notes (Signed)
Pt's daughter Nira Conn) brought in her father Tramadol 50mg  tabs to be wasted. 18 out of 60 on hand that was filled on 10/08/20. Waste was witnessed by Myself, Silvana Newness RN and his daughter.

## 2020-11-08 ENCOUNTER — Telehealth: Payer: Self-pay | Admitting: *Deleted

## 2020-11-08 NOTE — Telephone Encounter (Signed)
Called patient to inform him about permission to stop PLAVIX from his prescribing provider. No answer. LVM for him to call back.

## 2020-11-09 ENCOUNTER — Ambulatory Visit: Payer: Medicare HMO | Attending: Pulmonary Disease

## 2020-11-09 DIAGNOSIS — G4733 Obstructive sleep apnea (adult) (pediatric): Secondary | ICD-10-CM | POA: Insufficient documentation

## 2020-11-09 DIAGNOSIS — Z6835 Body mass index (BMI) 35.0-35.9, adult: Secondary | ICD-10-CM | POA: Diagnosis not present

## 2020-11-10 ENCOUNTER — Other Ambulatory Visit: Payer: Self-pay

## 2020-11-15 ENCOUNTER — Encounter: Payer: Medicare HMO | Admitting: Pain Medicine

## 2020-11-15 ENCOUNTER — Telehealth (HOSPITAL_BASED_OUTPATIENT_CLINIC_OR_DEPARTMENT_OTHER): Payer: Medicare HMO | Admitting: Pulmonary Disease

## 2020-11-15 DIAGNOSIS — G4733 Obstructive sleep apnea (adult) (pediatric): Secondary | ICD-10-CM | POA: Diagnosis not present

## 2020-11-15 NOTE — Telephone Encounter (Signed)
See recommendations by Dr. Elsworth Soho.  He needs CPAP at 12 cm H2O with a full facemask of choice.

## 2020-11-15 NOTE — Telephone Encounter (Signed)
Recommend CPAP 12 cm with med Full face mask

## 2020-11-15 NOTE — Telephone Encounter (Signed)
Patient's daughter, [name](DPR) is aware of results and voiced her understanding.  Order has been placed for cpap.  Recall has been placed for 12/2020, as schedule is not out.  Nothing further needed.

## 2020-11-16 ENCOUNTER — Encounter: Payer: Self-pay | Admitting: Pain Medicine

## 2020-11-16 ENCOUNTER — Ambulatory Visit
Admission: RE | Admit: 2020-11-16 | Discharge: 2020-11-16 | Disposition: A | Discharge status: Home / Self Care | Payer: Medicare HMO | Source: Ambulatory Visit | Attending: Pain Medicine | Admitting: Pain Medicine

## 2020-11-16 ENCOUNTER — Other Ambulatory Visit: Payer: Self-pay

## 2020-11-16 ENCOUNTER — Ambulatory Visit (HOSPITAL_BASED_OUTPATIENT_CLINIC_OR_DEPARTMENT_OTHER): Payer: Medicare HMO | Admitting: Pain Medicine

## 2020-11-16 VITALS — BP 120/61 | HR 65 | Temp 98.6°F | Resp 20 | Ht 67.0 in | Wt 278.0 lb

## 2020-11-16 DIAGNOSIS — M4727 Other spondylosis with radiculopathy, lumbosacral region: Secondary | ICD-10-CM | POA: Diagnosis present

## 2020-11-16 DIAGNOSIS — M5137 Other intervertebral disc degeneration, lumbosacral region: Secondary | ICD-10-CM | POA: Diagnosis not present

## 2020-11-16 DIAGNOSIS — M5136 Other intervertebral disc degeneration, lumbar region: Secondary | ICD-10-CM | POA: Diagnosis present

## 2020-11-16 DIAGNOSIS — M48061 Spinal stenosis, lumbar region without neurogenic claudication: Secondary | ICD-10-CM | POA: Insufficient documentation

## 2020-11-16 DIAGNOSIS — M4316 Spondylolisthesis, lumbar region: Secondary | ICD-10-CM

## 2020-11-16 DIAGNOSIS — Z7901 Long term (current) use of anticoagulants: Secondary | ICD-10-CM | POA: Insufficient documentation

## 2020-11-16 DIAGNOSIS — M48062 Spinal stenosis, lumbar region with neurogenic claudication: Secondary | ICD-10-CM | POA: Insufficient documentation

## 2020-11-16 MED ORDER — SODIUM CHLORIDE 0.9% FLUSH
2.0000 mL | Freq: Once | INTRAVENOUS | Status: AC
  Administered 2020-11-16: 2 mL

## 2020-11-16 MED ORDER — IOHEXOL 180 MG/ML  SOLN
10.0000 mL | Freq: Once | INTRAMUSCULAR | Status: AC
  Administered 2020-11-16: 10 mL via EPIDURAL
  Filled 2020-11-16: qty 20

## 2020-11-16 MED ORDER — SODIUM CHLORIDE (PF) 0.9 % IJ SOLN
INTRAMUSCULAR | Status: AC
  Filled 2020-11-16: qty 10

## 2020-11-16 MED ORDER — LIDOCAINE HCL (PF) 2 % IJ SOLN
INTRAMUSCULAR | Status: AC
  Filled 2020-11-16: qty 20

## 2020-11-16 MED ORDER — LIDOCAINE HCL (PF) 1 % IJ SOLN
INTRAMUSCULAR | Status: AC
  Filled 2020-11-16: qty 5

## 2020-11-16 MED ORDER — TRIAMCINOLONE ACETONIDE 40 MG/ML IJ SUSP
40.0000 mg | Freq: Once | INTRAMUSCULAR | Status: AC
  Administered 2020-11-16: 40 mg
  Filled 2020-11-16: qty 1

## 2020-11-16 MED ORDER — LIDOCAINE HCL 2 % IJ SOLN
20.0000 mL | Freq: Once | INTRAMUSCULAR | Status: AC
  Administered 2020-11-16: 400 mg

## 2020-11-16 MED ORDER — ROPIVACAINE HCL 2 MG/ML IJ SOLN
2.0000 mL | Freq: Once | INTRAMUSCULAR | Status: AC
  Administered 2020-11-16: 2 mL via EPIDURAL
  Filled 2020-11-16: qty 10

## 2020-11-16 NOTE — Progress Notes (Signed)
Safety precautions to be maintained throughout the outpatient stay will include: orient to surroundings, keep bed in low position, maintain call bell within reach at all times, provide assistance with transfer out of bed and ambulation.  

## 2020-11-16 NOTE — Patient Instructions (Addendum)
Do not restart Plavix any sooner than 3 hours after the procedure. ____________________________________________________________________________________________  Post-Procedure Discharge Instructions  Instructions:  Apply ice:   Purpose: This will minimize any swelling and discomfort after procedure.   When: Day of procedure, as soon as you get home.  How: Fill a plastic sandwich bag with crushed ice. Cover it with a small towel and apply to injection site.  How long: (15 min on, 15 min off) Apply for 15 minutes then remove x 15 minutes.  Repeat sequence on day of procedure, until you go to bed.  Apply heat:   Purpose: To treat any soreness and discomfort from the procedure.  When: Starting the next day after the procedure.  How: Apply heat to procedure site starting the day following the procedure.  How long: May continue to repeat daily, until discomfort goes away.  Food intake: Start with clear liquids (like water) and advance to regular food, as tolerated.   Physical activities: Keep activities to a minimum for the first 8 hours after the procedure. After that, then as tolerated.  Driving: If you have received any sedation, be responsible and do not drive. You are not allowed to drive for 24 hours after having sedation.  Blood thinner: (Applies only to those taking blood thinners) You may restart your blood thinner 6 hours after your procedure.  Insulin: (Applies only to Diabetic patients taking insulin) As soon as you can eat, you may resume your normal dosing schedule.  Infection prevention: Keep procedure site clean and dry. Shower daily and clean area with soap and water.  Post-procedure Pain Diary: Extremely important that this be done correctly and accurately. Recorded information will be used to determine the next step in treatment. For the purpose of accuracy, follow these rules:  Evaluate only the area treated. Do not report or include pain from an untreated area.  For the purpose of this evaluation, ignore all other areas of pain, except for the treated area.  After your procedure, avoid taking a long nap and attempting to complete the pain diary after you wake up. Instead, set your alarm clock to go off every hour, on the hour, for the initial 8 hours after the procedure. Document the duration of the numbing medicine, and the relief you are getting from it.  Do not go to sleep and attempt to complete it later. It will not be accurate. If you received sedation, it is likely that you were given a medication that may cause amnesia. Because of this, completing the diary at a later time may cause the information to be inaccurate. This information is needed to plan your care.  Follow-up appointment: Keep your post-procedure follow-up evaluation appointment after the procedure (usually 2 weeks for most procedures, 6 weeks for radiofrequencies). DO NOT FORGET to bring you pain diary with you.   Expect: (What should I expect to see with my procedure?)  From numbing medicine (AKA: Local Anesthetics): Numbness or decrease in pain. You may also experience some weakness, which if present, could last for the duration of the local anesthetic.  Onset: Full effect within 15 minutes of injected.  Duration: It will depend on the type of local anesthetic used. On the average, 1 to 8 hours.   From steroids (Applies only if steroids were used): Decrease in swelling or inflammation. Once inflammation is improved, relief of the pain will follow.  Onset of benefits: Depends on the amount of swelling present. The more swelling, the longer it will take for  the benefits to be seen. In some cases, up to 10 days.  Duration: Steroids will stay in the system x 2 weeks. Duration of benefits will depend on multiple posibilities including persistent irritating factors.  Side-effects: If present, they may typically last 2 weeks (the duration of the steroids).  Frequent: Cramps (if they  occur, drink Gatorade and take over-the-counter Magnesium 450-500 mg once to twice a day); water retention with temporary weight gain; increases in blood sugar; decreased immune system response; increased appetite.  Occasional: Facial flushing (red, warm cheeks); mood swings; menstrual changes.  Uncommon: Long-term decrease or suppression of natural hormones; bone thinning. (These are more common with higher doses or more frequent use. This is why we prefer that our patients avoid having any injection therapies in other practices.)   Very Rare: Severe mood changes; psychosis; aseptic necrosis.  From procedure: Some discomfort is to be expected once the numbing medicine wears off. This should be minimal if ice and heat are applied as instructed.  Call if: (When should I call?)  You experience numbness and weakness that gets worse with time, as opposed to wearing off.  New onset bowel or bladder incontinence. (Applies only to procedures done in the spine)  Emergency Numbers:  Durning business hours (Monday - Thursday, 8:00 AM - 4:00 PM) (Friday, 9:00 AM - 12:00 Noon): [phone]  After hours: [phone]  NOTE: If you are having a problem and are unable connect with, or to talk to a provider, then go to your nearest urgent care or emergency department. If the problem is serious and urgent, please call 911. ____________________________________________________________________________________________    ______________________________________________________________________________________________  Body mass index (BMI)  Body mass index (BMI) is a common tool for deciding whether a person has an appropriate body weight.  It measures a persons weight in relation to their height.   According to the [employer] of health (NIH): Marland Kitchen A BMI of less than 18.5 means that a person is underweight. . A BMI of between 18.5 and 24.9 is ideal. . A BMI of between 25 and 29.9 is  overweight. . A BMI over 30 indicates obesity.  Weight Management Required  URGENT: Your weight has been found to be adversely affecting your health.  Dear Mr. Guin:  Your current Estimated body mass index is 43.54 kg/m as calculated from the following:   Height as of this encounter: '5\' 7"'  (1.702 m).   Weight as of this encounter: 278 lb (126.1 kg).  Please use the table below to identify your weight category and associated incidence of chronic pain, secondary to your weight.  Body Mass Index (BMI) Classification BMI level (kg/m2) Category Associated incidence of chronic pain  <18  Underweight   18.5-24.9 Ideal body weight   25-29.9 Overweight  20%  30-34.9 Obese (Class I)  68%  35-39.9 Severe obesity (Class II)  136%  >40 Extreme obesity (Class III)  254%   In addition: You will be considered "Morbidly Obese", if your BMI is above 30 and you have one or more of the following conditions which are known to be caused and/or directly associated with obesity: 1.    Type 2 Diabetes (Which in turn can lead to cardiovascular diseases (CVD), stroke, peripheral vascular diseases (PVD), retinopathy, nephropathy, and neuropathy) 2.    Cardiovascular Disease (High Blood Pressure; Congestive Heart Failure; High Cholesterol; Coronary Artery Disease; Angina; or History of Heart Attacks) 3.    Breathing problems (Asthma; obesity-hypoventilation syndrome; obstructive sleep apnea; chronic inflammatory  airway disease; reactive airway disease; or shortness of breath) 4.    Chronic kidney disease 5.    Liver disease (nonalcoholic fatty liver disease) 6.    High blood pressure 7.    Acid reflux (gastroesophageal reflux disease; heartburn) 8.    Osteoarthritis (OA) (with any of the following: hip pain; knee pain; and/or low back pain) 9.    Low back pain (Lumbar Facet Syndrome; and/or Degenerative Disc Disease) 10.  Hip pain (Osteoarthritis of hip) (For every 1 lbs of added body weight, there is a 2 lbs  increase in pressure inside of each hip articulation. 1:2 mechanical relationship) 11.  Knee pain (Osteoarthritis of knee) (For every 1 lbs of added body weight, there is a 4 lbs increase in pressure inside of each knee articulation. 1:4 mechanical relationship) (patients with a BMI>30 kg/m2 were 6.8 times more likely to develop knee OA than normal-weight individuals) 12.  Cancer: Epidemiological studies have shown that obesity is a risk factor for: post-menopausal breast cancer; cancers of the endometrium, colon and kidney cancer; malignant adenomas of the oesophagus. Obese subjects have an approximately 1.5-3.5-fold increased risk of developing these cancers compared with normal-weight subjects, and it has been estimated that between 15 and 45% of these cancers can be attributed to overweight. More recent studies suggest that obesity may also increase the risk of other types of cancer, including pancreatic, hepatic and gallbladder cancer. (Ref: Obesity and cancer. Pischon T, Nthlings U, [employer] H. Proc Nutr Soc. 2008 May;67(2):128-45. doi: 77.4128/N8676720947096283.) The International Agency for Research on Cancer (IARC) has identified 13 cancers associated with overweight and obesity: meningioma, multiple myeloma, adenocarcinoma of the esophagus, and cancers of the thyroid, postmenopausal breast cancer, gallbladder, stomach, liver, pancreas, kidney, ovaries, uterus, colon and rectal (colorectal) cancers. 41 percent of all cancers diagnosed in women and 24 percent of those diagnosed in men are associated with overweight and obesity.  Recommendation: At this point it is urgent that you take a step back and concentrate in loosing weight. Dedicate 100% of your efforts on this task. Nothing else will improve your health more than bringing your weight down and your BMI to less than 30. If you are here, you probably have chronic pain. We know that most chronic pain patients have difficulty exercising secondary to  their pain. For this reason, you must rely on proper nutrition and diet in order to lose the weight. If your BMI is above 40, you should seriously consider bariatric surgery. A realistic goal is to lose 10% of your body weight over a period of 12 months.  Be honest to yourself, if over time you have unsuccessfully tried to lose weight, then it is time for you to seek professional help and to enter a medically supervised weight management program, and/or undergo bariatric surgery. Stop procrastinating.   Pain management considerations:  1.    Pharmacological Problems: Be advised that the use of opioid analgesics (oxycodone; hydrocodone; morphine; methadone; codeine; and all of their derivatives) have been associated with decreased metabolism and weight gain.  For this reason, should we see that you are unable to lose weight while taking these medications, it may become necessary for [place] to taper down and indefinitely discontinue them.  2.    Technical Problems: The incidence of successful interventional therapies decreases as the patient's BMI increases. It is much more difficult to accomplish a safe and effective interventional therapy on a patient with a BMI above 35. 3.    Radiation Exposure Problems: The x-rays  machine, used to accomplish injection therapies, will automatically increase their x-ray output in order to capture an appropriate bone image. This means that radiation exposure increases exponentially with the patient's BMI. (The higher the BMI, the higher the radiation exposure.) Although the level of radiation used at a given time is still safe to the patient, it is not for the physician and/or assisting staff. Unfortunately, radiation exposure is accumulative. Because physicians and the staff have to do procedures and be exposed on a daily basis, this can result in health problems such as cancer and radiation burns. Radiation exposure to the staff is monitored by the radiation batches that they  wear. The exposure levels are reported back to the staff on a quarterly basis. Depending on levels of exposure, physicians and staff may be obligated by law to decrease this exposure. This means that they have the right and obligation to refuse providing therapies where they may be overexposed to radiation. For this reason, physicians may decline to offer therapies such as radiofrequency ablation or implants to patients with a BMI above 40. 4.    Current Trends: Be advised that the current trend is to no longer offer certain therapies to patients with a BMI equal to, or above 35, due to increase perioperative risks, increased technical procedural difficulties, and excessive radiation exposure to healthcare personnel.  ______________________________________________________________________________________________

## 2020-11-16 NOTE — Addendum Note (Signed)
Addended by: Milinda Pointer A on: 11/16/2020 02:13 PM   Modules accepted: Orders

## 2020-11-16 NOTE — Progress Notes (Addendum)
PROVIDER NOTE: Information contained herein reflects review and annotations entered in association with encounter. Interpretation of such information and data should be left to medically-trained personnel. Information provided to patient can be located elsewhere in the medical record under "Patient Instructions". Document created using STT-dictation technology, any transcriptional errors that may result from process are unintentional.    Patient: [name]  Service Category: Procedure  Provider: Gaspar Cola, MD  DOB: [birth year]  DOS: 11/16/2020  Location: [facility]  MRN: [id number]  Setting: Ambulatory - outpatient  Referring Provider: Marguerita Merles, MD  Type: Established Patient  Specialty: Interventional Pain Management  PCP: Marguerita Merles, MD   Primary Reason for Visit: Interventional Pain Management Treatment. CC: Back Pain (lower)  Procedure:          Anesthesia, Analgesia, Anxiolysis:  Type: Diagnostic Inter-Laminar Epidural Steroid Injection  #1  Region: Lumbar Level: L2-3 Level. Laterality: Left Paramedial  Type: Local Anesthesia Indication(s): Analgesia         Route: Infiltration (Hoyt/IM) IV Access: Declined Sedation: Declined  Local Anesthetic: Lidocaine 1-2%  Position: Prone with head of the table was raised to facilitate breathing.   Indications: 1. DDD (degenerative disc disease), lumbosacral   2. Grade 1 (3 mm) anterolisthesis of lumbar spine (L4-5) (stable)   3. Lumbar central spinal stenosis with neurogenic claudication   4. Lumbar foraminal stenosis   5. Osteoarthritis of spine with radiculopathy, lumbosacral region   6. Other intervertebral disc degeneration, lumbar region    Chronic anticoagulation (Plavix)    Pain Score: Pre-procedure: 10-Worst pain ever/10 Post-procedure: 0-No pain (10 movement produces sharp,stabbing pain on left.)/10   Pre-op H&P Assessment:  Mr. Alcocer is a 85 y.o. (year old), male patient, seen today for  interventional treatment. He  has a past surgical history that includes Total hip arthroplasty; Coronary/Graft Acute MI Revascularization (N/A, 04/01/2019); CORONARY ANGIOGRAPHY (N/A, 04/01/2019); Cardiac catheterization; and Coronary angioplasty with stent. Mr. Foutz has a current medication list which includes the following prescription(s): adult aspirin regimen, albuterol, amlodipine, atorvastatin, calcium carbonate, carvedilol, vitamin d3, clopidogrel, ergocalciferol, fluticasone, furosemide, gabapentin, ipratropium, isosorbide mononitrate, magnesium oxide, nitroglycerin, oxycodone, quetiapine, tizanidine, tramadol, and trelegy ellipta. His primarily concern today is the Back Pain (lower)  Initial Vital Signs:  Pulse/HCG Rate: 65ECG Heart Rate: 69 Temp: 98.6 F (37 C) Resp: 16 BP: (!) 91/55 SpO2: 98 %  BMI: Estimated body mass index is 43.54 kg/m as calculated from the following:   Height as of this encounter: 5\' 7"  (1.702 m).   Weight as of this encounter: 278 lb (126.1 kg).  Risk Assessment: Allergies: Reviewed. He is allergic to chlorthalidone, lisinopril, other, and benazepril-hydrochlorothiazide.  Allergy Precautions: None required Coagulopathies: Reviewed. None identified.  Blood-thinner therapy: None at this time Active Infection(s): Reviewed. None identified. Mr. Kasler is afebrile  Site Confirmation: Mr. Vuncannon was asked to confirm the procedure and laterality before marking the site Procedure checklist: Completed Consent: Before the procedure and under the influence of no sedative(s), amnesic(s), or anxiolytics, the patient was informed of the treatment options, risks and possible complications. To fulfill our ethical and legal obligations, as recommended by the American Medical Association's Code of Ethics, I have informed the patient of my clinical impression; the nature and purpose of the treatment or procedure; the risks, benefits, and possible complications of the intervention;  the alternatives, including doing nothing; the risk(s) and benefit(s) of the alternative treatment(s) or procedure(s); and the risk(s) and benefit(s) of doing nothing. The patient was  provided information about the general risks and possible complications associated with the procedure. These may include, but are not limited to: failure to achieve desired goals, infection, bleeding, organ or nerve damage, allergic reactions, paralysis, and death. In addition, the patient was informed of those risks and complications associated to Spine-related procedures, such as failure to decrease pain; infection (i.e.: Meningitis, epidural or intraspinal abscess); bleeding (i.e.: epidural hematoma, subarachnoid hemorrhage, or any other type of intraspinal or peri-dural bleeding); organ or nerve damage (i.e.: Any type of peripheral nerve, nerve root, or spinal cord injury) with subsequent damage to sensory, motor, and/or autonomic systems, resulting in permanent pain, numbness, and/or weakness of one or several areas of the body; allergic reactions; (i.e.: anaphylactic reaction); and/or death. Furthermore, the patient was informed of those risks and complications associated with the medications. These include, but are not limited to: allergic reactions (i.e.: anaphylactic or anaphylactoid reaction(s)); adrenal axis suppression; blood sugar elevation that in diabetics may result in ketoacidosis or comma; water retention that in patients with history of congestive heart failure may result in shortness of breath, pulmonary edema, and decompensation with resultant heart failure; weight gain; swelling or edema; medication-induced neural toxicity; particulate matter embolism and blood vessel occlusion with resultant organ, and/or nervous system infarction; and/or aseptic necrosis of one or more joints. Finally, the patient was informed that Medicine is not an exact science; therefore, there is also the possibility of unforeseen or  unpredictable risks and/or possible complications that may result in a catastrophic outcome. The patient indicated having understood very clearly. We have given the patient no guarantees and we have made no promises. Enough time was given to the patient to ask questions, all of which were answered to the patient's satisfaction. Mr. Tarte has indicated that he wanted to continue with the procedure. Attestation: I, the ordering provider, attest that I have discussed with the patient the benefits, risks, side-effects, alternatives, likelihood of achieving goals, and potential problems during recovery for the procedure that I have provided informed consent. Date  Time: 11/16/2020 11:38 AM  Pre-Procedure Preparation:  Monitoring: As per clinic protocol. Respiration, ETCO2, SpO2, BP, heart rate and rhythm monitor placed and checked for adequate function Safety Precautions: Patient was assessed for positional comfort and pressure points before starting the procedure. Time-out: I initiated and conducted the "Time-out" before starting the procedure, as per protocol. The patient was asked to participate by confirming the accuracy of the "Time Out" information. Verification of the correct person, site, and procedure were performed and confirmed by me, the nursing staff, and the patient. "Time-out" conducted as per Joint Commission's Universal Protocol (UP.01.01.01). Time: 1207  Description of Procedure:          Target Area: The interlaminar space, initially targeting the lower laminar border of the superior vertebral body. Approach: Paramedial approach. Area Prepped: Entire Posterior Lumbar Region DuraPrep (Iodine Povacrylex [0.7% available iodine] and Isopropyl Alcohol, 74% w/w) Safety Precautions: Aspiration looking for blood return was conducted prior to all injections. At no point did we inject any substances, as a needle was being advanced. No attempts were made at seeking any paresthesias. Safe injection  practices and needle disposal techniques used. Medications properly checked for expiration dates. SDV (single dose vial) medications used. Description of the Procedure: Protocol guidelines were followed. The procedure needle was introduced through the skin, ipsilateral to the reported pain, and advanced to the target area. Bone was contacted and the needle walked caudad, until the lamina was cleared. The epidural space was  identified using "loss-of-resistance technique" with 2-3 ml of PF-NaCl (0.9% NSS), in a 5cc LOR glass syringe.  Vitals:   11/16/20 1211 11/16/20 1214 11/16/20 1224 11/16/20 1233  BP: 102/63 (!) 83/52 120/61   Pulse:      Resp: 10 12 20    Temp:      SpO2: 95% 96% 96%   Weight:    278 lb (126.1 kg)  Height:        Start Time: 1207 hrs. End Time: 1224 hrs.  Materials:  Needle(s) Type: Epidural needle Gauge: 20G Length: 10cm Medication(s): Please see orders for medications and dosing details.  Imaging Guidance (Spinal):          Type of Imaging Technique: Fluoroscopy Guidance (Spinal) Indication(s): Assistance in needle guidance and placement for procedures requiring needle placement in or near specific anatomical locations not easily accessible without such assistance. Exposure Time: Please see nurses notes. Contrast: Before injecting any contrast, we confirmed that the patient did not have an allergy to iodine, shellfish, or radiological contrast. Once satisfactory needle placement was completed at the desired level, radiological contrast was injected. Contrast injected under live fluoroscopy. No contrast complications. See chart for type and volume of contrast used. Fluoroscopic Guidance: I was personally present during the use of fluoroscopy. "Tunnel Vision Technique" used to obtain the best possible view of the target area. Parallax error corrected before commencing the procedure. "Direction-depth-direction" technique used to introduce the needle under continuous  pulsed fluoroscopy. Once target was reached, antero-posterior, oblique, and lateral fluoroscopic projection used confirm needle placement in all planes. Images permanently stored in EMR. Interpretation: I personally interpreted the imaging intraoperatively. Adequate needle placement confirmed in multiple planes. Appropriate spread of contrast into desired area was observed. No evidence of afferent or efferent intravascular uptake. No intrathecal or subarachnoid spread observed. Permanent images saved into the patient's record.  Antibiotic Prophylaxis:   Anti-infectives (From admission, onward)   None     Indication(s): None identified  Post-operative Assessment:  Post-procedure Vital Signs:  Pulse/HCG Rate: 6572 Temp: 98.6 F (37 C) Resp: 20 BP: 120/61 SpO2: 96 %  EBL: None  Complications: No immediate post-treatment complications observed by team, or reported by patient.  Note: The patient tolerated the entire procedure well. A repeat set of vitals were taken after the procedure and the patient was kept under observation following institutional policy, for this type of procedure. Post-procedural neurological assessment was performed, showing return to baseline, prior to discharge. The patient was provided with post-procedure discharge instructions, including a section on how to identify potential problems. Should any problems arise concerning this procedure, the patient was given instructions to immediately contact us, at any time, without hesitation. In any case, we plan to contact the patient by telephone for a follow-up status report regarding this interventional procedure.  Comments:  No additional relevant information.  Plan of Care  Orders:  Orders Placed This Encounter  Procedures  . Lumbar Epidural Injection    Scheduling Instructions:     Procedure: Interlaminar LESI L2-3     Laterality: Left-sided     Sedation: Patient's choice     Timeframe:  Today    Order Specific  Question:   Where will this procedure be performed?    Answer:   [facility]  . [facility] C-ARM 1-60 MIN NO REPORT    Intraoperative interpretation by procedural physician at [place].    Standing Status:   Standing    Number of Occurrences:  1    Order Specific Question:   Reason for exam:    Answer:   Assistance in needle guidance and placement for procedures requiring needle placement in or near specific anatomical locations not easily accessible without such assistance.  . Informed Consent Details: Physician/Practitioner Attestation; Transcribe to consent form and obtain patient signature    Note: Always confirm laterality of pain with Mr. Schwarz, before procedure. Transcribe to consent form and obtain patient signature.    Order Specific Question:   Physician/Practitioner attestation of informed consent for procedure/surgical case    Answer:   I, the physician/practitioner, attest that I have discussed with the patient the benefits, risks, side effects, alternatives, likelihood of achieving goals and potential problems during recovery for the procedure that I have provided informed consent.    Order Specific Question:   Procedure    Answer:   Lumbar epidural steroid injection under fluoroscopic guidance    Order Specific Question:   Physician/Practitioner performing the procedure    Answer:   Lurline Caver A. Dossie Arbour, MD    Order Specific Question:   Indication/Reason    Answer:   Low back and/or lower extremity pain secondary to lumbar radiculitis  . Care order/instruction: Please confirm that the patient has stopped the Plavix (Clopidogrel) x 7-10 days prior to procedure or surgery.    Please confirm that the patient has stopped the Plavix (Clopidogrel) x 7-10 days prior to procedure or surgery.    Standing Status:   Standing    Number of Occurrences:   1  . Provide equipment / supplies at bedside    "Epidural Tray" (Disposable  single use) Catheter: NOT  required    Standing Status:   Standing    Number of Occurrences:   1    Order Specific Question:   Specify    Answer:   Epidural Tray  . Bleeding precautions    Standing Status:   Standing    Number of Occurrences:   1   Chronic Opioid Analgesic:  Tramadol 50 mg, 1 tab p.o. 4 times daily (200 mg/day of tramadol) (20 MME/day) MME/day: 20 mg/day   Medications ordered for procedure: Meds ordered this encounter  Medications  . iohexol (OMNIPAQUE) 180 MG/ML injection 10 mL    Must be Myelogram-compatible. If not available, you may substitute with a water-soluble, non-ionic, hypoallergenic, myelogram-compatible radiological contrast medium.  Marland Kitchen lidocaine (XYLOCAINE) 2 % (with pres) injection 400 mg  . sodium chloride flush (NS) 0.9 % injection 2 mL  . ropivacaine (PF) 2 mg/mL (0.2%) (NAROPIN) injection 2 mL  . triamcinolone acetonide (KENALOG-40) injection 40 mg   Medications administered: We administered iohexol, lidocaine, sodium chloride flush, ropivacaine (PF) 2 mg/mL (0.2%), and triamcinolone acetonide.  See the medical record for exact dosing, route, and time of administration.  Follow-up plan:   Return in about 2 weeks (around 11/30/2020) for (F2F), (PP) Follow-up.       Interventional management options: Planned, scheduled, and/or pending:    NOTE: PLAVIX Anticoagulation (Stop: 7-10 days  Restart: 2 hours) Pending to order bilateral shoulder x-rays.  Possible need for neurosurgical evaluation.  We will also consider nerve conduction testing of the lower extremities.   Considering:   Diagnostic left L2-3 LESI #1  Diagnostic left L3-4 LESI #1  Diagnostic left L4-5 LESI #1  Diagnostic bilateral L2 TFESI #1  Diagnostic bilateral L3 TFESI #1  Diagnostic bilateral L4 TFESI #1  Diagnostic bilateral lumbar facet block #1  Diagnostic bilateral femoral nerve and obturator nerve  block #1  Diagnostic bilateral IA knee joint injection (steroid) #1  Possible series of bilateral IA  Hyalgan knee injections  Diagnostic bilateral genicular nerve block #1    PRN Procedures:   None at this time     Recent Visits Date Type Provider Dept  10/25/20 Office Visit Milinda Pointer, MD [facility]  09/20/20 Office Visit Milinda Pointer, MD [facility]  Showing recent visits within past 90 days and meeting all other requirements Today's Visits Date Type Provider Dept  11/16/20 Procedure visit Milinda Pointer, MD [facility]  Showing today's visits and meeting all other requirements Future Appointments Date Type Provider Dept  11/29/20 Appointment Milinda Pointer, MD [facility]  Showing future appointments within next 90 days and meeting all other requirements  Disposition: Discharge home  Discharge (Date  Time): 11/16/2020; 1233 hrs.   Primary Care Physician: Marguerita Merles, MD Location: [facility] Note by: Gaspar Cola, MD Date: 11/16/2020; Time: 2:13 PM  Disclaimer:  Medicine is not an Chief Strategy Officer. The only guarantee in medicine is that nothing is guaranteed. It is important to note that the decision to proceed with this intervention was based on the information collected from the patient. The Data and conclusions were drawn from the patient's questionnaire, the interview, and the physical examination. Because the information was provided in large part by the patient, it cannot be guaranteed that it has not been purposely or unconsciously manipulated. Every effort has been made to obtain as much relevant data as possible for this evaluation. It is important to note that the conclusions that lead to this procedure are derived in large part from the available data. Always take into account that the treatment will also be dependent on availability of resources and existing treatment guidelines, considered by other Pain Management Practitioners as being common knowledge and practice, at  the time of the intervention. For Medico-Legal purposes, it is also important to point out that variation in procedural techniques and pharmacological choices are the acceptable norm. The indications, contraindications, technique, and results of the above procedure should only be interpreted and judged by a Board-Certified Interventional Pain Specialist with extensive familiarity and expertise in the same exact procedure and technique.

## 2020-11-17 ENCOUNTER — Telehealth: Payer: Self-pay | Admitting: *Deleted

## 2020-11-17 DIAGNOSIS — M0580 Other rheumatoid arthritis with rheumatoid factor of unspecified site: Secondary | ICD-10-CM | POA: Insufficient documentation

## 2020-11-17 DIAGNOSIS — E790 Hyperuricemia without signs of inflammatory arthritis and tophaceous disease: Secondary | ICD-10-CM | POA: Insufficient documentation

## 2020-11-17 NOTE — Telephone Encounter (Signed)
Spoke with [name], reports she has not spoken with patient this a.m. She will check on him and call if any problems.

## 2020-11-28 NOTE — Progress Notes (Signed)
PROVIDER NOTE: Information contained herein reflects review and annotations entered in association with encounter. Interpretation of such information and data should be left to medically-trained personnel. Information provided to patient can be located elsewhere in the medical record under "Patient Instructions". Document created using STT-dictation technology, any transcriptional errors that may result from process are unintentional.    Patient: [name]  Service Category: E/M  Provider: Gaspar Cola, MD  DOB: 04-Feb-1935  DOS: 11/29/2020  Specialty: Interventional Pain Management  MRN: [id number]  Setting: Ambulatory outpatient  PCP: Marguerita Merles, MD  Type: Established Patient    Referring Provider: Marguerita Merles, MD  Location: Office  Delivery: Face-to-face     HPI  Mr. BERTHOLD GLACE, a 85 y.o. year old male, is here today because of his Chronic pain syndrome [G89.4]. Mr. Guandique primary complain today is Back Pain (Lumbar left side ) and Knee Pain (Left ) Last encounter: My last encounter with him was on 11/16/2020. Pertinent problems: Mr. Cozort has [facility]); Lower extremity pain, bilateral; Chronic pain syndrome; Chronic hip pain (1ry area of Pain) (Left); Unilateral post-traumatic osteoarthritis of hip (Left); Chronic knee pain (2ry area of Pain) (Bilateral); Secondary osteoarthritis of knee (Bilateral); Osteoarthritis of knees (Bilateral); Chronic shoulder pain (3ry area of Pain) (Bilateral); Grade 1 (3 mm) anterolisthesis of lumbar spine (L4-5) (stable); Chronic groin pain (Left); Other intervertebral disc degeneration, lumbar region; Neurogenic pain;  Klippel-Feil deformity at the C5-C7 levels; Lumbosacral spondylosis; Osteoarthritis of facet joint of lumbar spine; DDD (degenerative disc disease), lumbosacral; History of total hip replacement (Right); Osteoarthritis of hip (Left); Abnormal MRI, lumbar spine (10/07/2020); Lumbar central spinal stenosis with neurogenic  claudication; Lumbar foraminal stenosis; Polyarthritis with positive rheumatoid factor (HCC); and History of alcohol abuse on their pertinent problem list. Pain Assessment: Severity of Chronic pain is reported as a 10-Worst pain ever/10. Location: Back Lower,Left/into left leg and left knee. Onset: More than a month ago. Quality: Discomfort,Sharp,Stabbing. Timing: Constant. Modifying factor(s): procedure helped for a couple of days.. Vitals:  height is _0  (1.702 m) and weight is 278 lb (126.1 kg). His temporal temperature is 97.5 F (36.4 C) (abnormal). His blood pressure is 133/66 and his pulse is 57 (abnormal). His respiration is 16 and oxygen saturation is 100%.   Reason for encounter: both, medication management and post-procedure assessment.  The patient describes not really using the oxycodone since it does not help.  He also stopped taking very tramadol for the same reason.  In view of this, I will not be sending refills on either medication to the pharmacy.  Today he comes in and he appears to be having intermittent electrical-like sensations going towards the lateral aspect of his left hip.  Today I have reviewed the x-rays and he has an intramedullary nail through his femur going into the area of the hip joint.  The pain that he seems to be experiencing is a neuropathic type pain.  This would explain the reason why neither the tramadol nor the oxycodone have helped pain.  He is taking only 300 mg of gabapentin at bedtime and he seems to tolerate that well.  Today we will start increasing that as tolerated to see if we can really address this neuropathic pain.  The patient's uric acid and rheumatoid factor came back elevated.  This would suggest that he may be having some rheumatoid arthritis as well as gout.  Both, the C-reactive protein and the sed rate came back elevated and he also  has a vitamin D deficiency.  I will be referring him to a rheumatologist for further evaluation and  treatment.  Post-Procedure Evaluation  Procedure (11/16/2020): Diagnostic left L2-3 LESI #1 under fluoroscopic guidance, no sedation Pre-procedure pain level: 10/10 Post-procedure: 0/10 (100% relief)  Sedation: None.  Effectiveness during initial hour after procedure(Ultra-Short Term Relief): 100 %.  Local anesthetic used: Long-acting (4-6 hours) Effectiveness: Defined as any analgesic benefit obtained secondary to the administration of local anesthetics. This carries significant diagnostic value as to the etiological location, or anatomical origin, of the pain. Duration of benefit is expected to coincide with the duration of the local anesthetic used.  Effectiveness during initial 4-6 hours after procedure(Short-Term Relief): 100 %.  Long-term benefit: Defined as any relief past the pharmacologic duration of the local anesthetics.  Effectiveness past the initial 6 hours after procedure(Long-Term Relief): 100 % (good pain relief x 2 days and then pain returned).  Current benefits: Defined as benefit that persist at this time.   Analgesia:  <50% better Function: Back to baseline ROM: Back to baseline  Pharmacotherapy Assessment   Analgesic: Tramadol 50 mg, 1 tab p.o. 4 times daily (200 mg/day of tramadol) (20 MME/day) MME/day: 20 mg/day   Monitoring: [place] PMP: PDMP reviewed during this encounter.       Pharmacotherapy: No side-effects or adverse reactions reported. Compliance: No problems identified. Effectiveness: Clinically acceptable.  Janett Billow, RN  11/29/2020  2:10 PM  Sign when Signing Visit Nursing Pain Medication Assessment:  Safety precautions to be maintained throughout the outpatient stay will include: orient to surroundings, keep bed in low position, maintain call bell within reach at all times, provide assistance with transfer out of bed and ambulation.  Medication Inspection Compliance: Pill count conducted under aseptic conditions, in front of the patient.  Neither the pills nor the bottle was removed from the patient's sight at any time. Once count was completed pills were immediately returned to the patient in their original bottle.  Medication: Oxycodone IR Pill/Patch Count: 14 of 120 pills remain Pill/Patch Appearance: Markings consistent with prescribed medication Bottle Appearance: Standard pharmacy container. Clearly labeled. Filled Date: 55 / 08 / 2021 Last Medication intake:  couple of days ago.     UDS:  Summary  Date Value Ref Range Status  09/20/2020 Note  Final    Comment:    ==================================================================== Compliance Drug Analysis, Ur ==================================================================== Test                             Result       Flag       Units  Drug Present and Declared for Prescription Verification   Tramadol                       >4274        EXPECTED   ng/mg creat   O-Desmethyltramadol            >4274        EXPECTED   ng/mg creat   N-Desmethyltramadol            843          EXPECTED   ng/mg creat    Source of tramadol is a prescription medication. O-desmethyltramadol    and N-desmethyltramadol are expected metabolites of tramadol.    Quetiapine  PRESENT      EXPECTED   Salicylate                     PRESENT      EXPECTED  Drug Present not Declared for Prescription Verification   Naproxen                       PRESENT      UNEXPECTED  Drug Absent but Declared for Prescription Verification   Tizanidine                     Not Detected UNEXPECTED    Tizanidine, as indicated in the declared medication list, is not    always detected even when used as directed.  ==================================================================== Test                      Result    Flag   Units      Ref Range   Creatinine              117              mg/dL      >=20 ==================================================================== Declared  Medications:  The flagging and interpretation on this report are based on the  following declared medications.  Unexpected results may arise from  inaccuracies in the declared medications.   **Note: The testing scope of this panel includes these medications:   Quetiapine (Seroquel)  Tramadol (Ultram)   **Note: The testing scope of this panel does not include small to  moderate amounts of these reported medications:   Aspirin  Tizanidine (Zanaflex)   **Note: The testing scope of this panel does not include the  following reported medications:   Albuterol  Amlodipine  Atorvastatin  Carvedilol  Clopidogrel (Plavix)  Fluticasone (Flonase)  Fluticasone (Trelegy)  Furosemide (Lasix)  Ipratropium (Atrovent)  Isosorbide (Imdur)  Nitroglycerin (Nitrostat)  Umeclidinium (Trelegy)  Vilanterol (Trelegy) ==================================================================== For clinical consultation, please call [phone]. ====================================================================      ROS  Constitutional: Denies any fever or chills Gastrointestinal: No reported hemesis, hematochezia, vomiting, or acute GI distress Musculoskeletal: Denies any acute onset joint swelling, redness, loss of ROM, or weakness Neurological: No reported episodes of acute onset apraxia, aphasia, dysarthria, agnosia, amnesia, paralysis, loss of coordination, or loss of consciousness  Medication Review  Fluticasone-Umeclidin-Vilant, Magnesium Oxide, QUEtiapine, Vitamin D3, albuterol, amLODipine, aspirin, atorvastatin, calcium carbonate, carvedilol, clopidogrel, fluticasone, furosemide, gabapentin, ipratropium, isosorbide mononitrate, nitroGLYCERIN, and tiZANidine  History Review  Allergy: Mr. Scheaffer is allergic to chlorthalidone, lisinopril, other, and benazepril-hydrochlorothiazide. Drug: Mr. Gintz  reports no history of drug use. Alcohol:  reports previous alcohol use. Tobacco:  reports that he  has quit smoking. His smoking use included cigarettes. He has a 50.00 pack-year smoking history. He has never used smokeless tobacco. Social: Mr. Smithson  reports that he has quit smoking. His smoking use included cigarettes. He has a 50.00 pack-year smoking history. He has never used smokeless tobacco. He reports previous alcohol use. He reports that he does not use drugs. Medical:  has a past medical history of Alcoholism ([place]), Asthma, Bipolar affective disorder ([place]), Chronic combined systolic and diastolic CHF (congestive heart failure) ([place]), CKD (chronic kidney disease), stage III ([place]), COPD (chronic obstructive pulmonary disease) ([place]), Coronary artery disease, Degenerative joint disease, Dyspnea, Essential hypertension, Hyperlipidemia, Hypertension, Ischemic cardiomyopathy, OSA (obstructive sleep apnea), Prostate CA ([place]), PVD (peripheral vascular disease) ([place]), and Tobacco abuse. Surgical: Mr.  Morina  has a past surgical history that includes Total hip arthroplasty; Coronary/Graft Acute MI Revascularization (N/A, 04/01/2019); CORONARY ANGIOGRAPHY (N/A, 04/01/2019); Cardiac catheterization; and Coronary angioplasty with stent. Family: family history includes Heart attack in his father and mother; Hyperlipidemia in his father and mother; Hypertension in his father and mother.  Laboratory Chemistry Profile   Renal Lab Results  Component Value Date   BUN 32 (H) 09/20/2020   CREATININE 2.28 (H) 09/20/2020   LABCREA 124 05/29/2017   BCR 14 09/20/2020   GFRAA 30 (L) 09/20/2020   GFRNONAA 26 (L) 09/20/2020     Hepatic Lab Results  Component Value Date   AST 14 09/20/2020   ALT 11 12/23/2019   ALBUMIN 3.5 (L) 09/20/2020   ALKPHOS 163 (H) 09/20/2020   HCVAB NEGATIVE 10/31/2010   AMYLASE 56 11/01/2010   LIPASE 48 07/31/2017     Electrolytes Lab Results  Component Value Date   NA 132 (L) 09/20/2020   K 3.9 09/20/2020   CL 94 (L) 09/20/2020   CALCIUM 8.3 (L) 09/20/2020   MG 2.0  09/20/2020     Bone Lab Results  Component Value Date   25OHVITD1 16 (L) 09/20/2020   25OHVITD2 <1.0 09/20/2020   25OHVITD3 15 09/20/2020     Inflammation (CRP: Acute Phase) (ESR: Chronic Phase) Lab Results  Component Value Date   CRP 39 (H) 09/20/2020   ESRSEDRATE >140 (H) 09/20/2020   LATICACIDVEN 1.0 10/30/2010       Note: Above Lab results reviewed.  Recent Imaging Review  SLEEP STUDY DOCUMENTS Ordered by an unspecified provider. Note: Reviewed        Physical Exam  General appearance: Well nourished, well developed, and well hydrated. In no apparent acute distress Mental status: Alert, oriented x 3 (person, place, & time)       Respiratory: No evidence of acute respiratory distress Eyes: PERLA Vitals: BP 133/66 (BP Location: Right Arm, Patient Position: Sitting, Cuff Size: Large)   Pulse (!) 57   Temp (!) 97.5 F (36.4 C) (Temporal)   Resp 16   Ht _0  (1.702 m)   Wt 278 lb (126.1 kg)   SpO2 100%   BMI 43.54 kg/m  BMI: Estimated body mass index is 43.54 kg/m as calculated from the following:   Height as of this encounter: _1  (1.702 m).   Weight as of this encounter: 278 lb (126.1 kg). Ideal: Ideal body weight: 66.1 kg (145 lb 11.6 oz) Adjusted ideal body weight: 90.1 kg (198 lb 10.2 oz)  Assessment   Status Diagnosis  Controlled Controlled Controlled 1. Chronic pain syndrome   2. Chronic hip pain (1ry area of Pain) (Left)   3. Chronic knee pain (2ry area of Pain) (Bilateral)   4. Chronic shoulder pain (3ry area of Pain) (Bilateral)   5. Chronic groin pain (Left)   6. Grade 1 (3 mm) anterolisthesis of lumbar spine (L4-5) (stable)   7. Pharmacologic therapy   8. Neurogenic pain   9. Vitamin D deficiency   10. History of alcohol abuse   11. Chronic anticoagulation (Plavix)   12. Elevated C-reactive protein (CRP)   13. Elevated sed rate   14. Elevated uric acid in blood   15. Elevated rheumatoid factor      Updated Problems: Problem  History  of Alcohol Abuse   Documented on 10/30/2016.   Elevated Rheumatoid Factor    Plan of Care  Problem-specific:  No problem-specific Assessment & Plan notes found for this encounter.  Mr. GRAHM ETSITTY has a current medication list which includes the following long-term medication(s): albuterol, amlodipine, atorvastatin, carvedilol, furosemide, ipratropium, isosorbide mononitrate, nitroglycerin, quetiapine, calcium carbonate, vitamin d3, gabapentin, and magnesium oxide.  Pharmacotherapy (Medications Ordered): Meds ordered this encounter  Medications  . gabapentin (NEURONTIN) 300 MG capsule    Sig: Take 1 capsule (300 mg total) by mouth 2 (two) times daily for 7 days, THEN 1 capsule (300 mg total) 3 (three) times daily for 7 days, THEN 1 capsule (300 mg total) 4 (four) times daily for 15 days. Follow written titration schedule.Marland Kitchen    Dispense:  95 capsule    Refill:  0    Fill one day early if pharmacy is closed on scheduled refill date. Generic permitted. Do not send renewal requests. Void any older duplicate prescription or refill(s) that may be on file.  . Cholecalciferol (VITAMIN D3) 125 MCG (5000 UT) CAPS    Sig: Take 1 capsule (5,000 Units total) by mouth daily with breakfast. Take along with calcium and magnesium.    Dispense:  30 capsule    Refill:  2    Fill one day early if pharmacy is closed on scheduled refill date. Generic permitted. Do not send renewal requests. Void any older duplicate prescription or refill(s) that may be on file.  . calcium carbonate (OSCAL) 1500 (600 Ca) MG TABS tablet    Sig: Take 1 tablet (1,500 mg total) by mouth 2 (two) times daily with a meal.    Dispense:  60 tablet    Refill:  2    Fill one day early if pharmacy is closed on scheduled refill date. Generic permitted. Do not send renewal requests. Void any older duplicate prescription or refill(s) that may be on file.  . Magnesium Oxide 500 MG CAPS    Sig: Take 1 capsule (500 mg total) by mouth daily.     Dispense:  30 capsule    Refill:  2    Fill one day early if pharmacy is closed on scheduled refill date. Generic permitted. Do not send renewal requests. Void any older duplicate prescription or refill(s) that may be on file.   Orders:  Orders Placed This Encounter  Procedures  . HIP INJECTION    Standing Status:   Future    Standing Expiration Date:   02/27/2021    Scheduling Instructions:     Side: Left-sided     Sedation: Patient's choice.     Timeframe: As soon as schedule allows  . Ambulatory referral to Rheumatology    Referral Priority:   Routine    Referral Type:   Consultation    Referral Reason:   Specialty Services Required    Requested Specialty:   Rheumatology    Number of Visits Requested:   1  . Blood Thinner Instructions to Nursing    Always make sure patient has clearance from prescribing physician to stop blood thinners for interventional therapies. If the patient requires a Lovenox-bridge therapy, make sure arrangements are made to institute it with the assistance of the PCP.    Scheduling Instructions:     Have Mr. Grape stop the Plavix (Clopidogrel) x 7-10 days prior to procedure or surgery.   Follow-up plan:   Return for Procedure (w/ sedation): (L) IA Hip inj #1, (Blood Thinner Protocol).      Interventional management options: Planned, scheduled, and/or pending:    NOTE: PLAVIX Anticoagulation (Stop: 7-10 days  Restart: 2 hours) Pending to order bilateral shoulder x-rays.  Possible need for neurosurgical evaluation.  We will also consider nerve conduction testing of the lower extremities.   Considering:   Diagnostic left L2-3 LESI #1  Diagnostic left L3-4 LESI #1  Diagnostic left L4-5 LESI #1  Diagnostic bilateral L2 TFESI #1  Diagnostic bilateral L3 TFESI #1  Diagnostic bilateral L4 TFESI #1  Diagnostic bilateral lumbar facet block #1  Diagnostic bilateral femoral nerve and obturator nerve block #1  Diagnostic bilateral IA knee joint injection  (steroid) #1  Possible series of bilateral IA Hyalgan knee injections  Diagnostic bilateral genicular nerve block #1    PRN Procedures:   None at this time      Recent Visits Date Type Provider Dept  11/16/20 Procedure visit Milinda Pointer, MD [facility]  10/25/20 Office Visit Milinda Pointer, MD [facility]  09/20/20 Office Visit Milinda Pointer, MD [facility]  Showing recent visits within past 90 days and meeting all other requirements Today's Visits Date Type Provider Dept  11/29/20 Office Visit Milinda Pointer, MD [facility]  Showing today's visits and meeting all other requirements Future Appointments No visits were found meeting these conditions. Showing future appointments within next 90 days and meeting all other requirements  I discussed the assessment and treatment plan with the patient. The patient was provided an opportunity to ask questions and all were answered. The patient agreed with the plan and demonstrated an understanding of the instructions.  Patient advised to call back or seek an in-person evaluation if the symptoms or condition worsens.  Duration of encounter: 38 minutes.  Note by: Gaspar Cola, MD Date: 11/29/2020; Time: 2:48 PM

## 2020-11-29 ENCOUNTER — Other Ambulatory Visit: Payer: Self-pay

## 2020-11-29 ENCOUNTER — Ambulatory Visit: Payer: Medicare HMO | Attending: Pain Medicine | Admitting: Pain Medicine

## 2020-11-29 ENCOUNTER — Encounter: Payer: Self-pay | Admitting: Pain Medicine

## 2020-11-29 VITALS — BP 133/66 | HR 57 | Temp 97.5°F | Resp 16 | Ht 67.0 in | Wt 278.0 lb

## 2020-11-29 DIAGNOSIS — M25511 Pain in right shoulder: Secondary | ICD-10-CM | POA: Diagnosis present

## 2020-11-29 DIAGNOSIS — M4316 Spondylolisthesis, lumbar region: Secondary | ICD-10-CM | POA: Insufficient documentation

## 2020-11-29 DIAGNOSIS — M25552 Pain in left hip: Secondary | ICD-10-CM | POA: Diagnosis present

## 2020-11-29 DIAGNOSIS — Z7901 Long term (current) use of anticoagulants: Secondary | ICD-10-CM | POA: Insufficient documentation

## 2020-11-29 DIAGNOSIS — M25561 Pain in right knee: Secondary | ICD-10-CM | POA: Insufficient documentation

## 2020-11-29 DIAGNOSIS — Z79899 Other long term (current) drug therapy: Secondary | ICD-10-CM

## 2020-11-29 DIAGNOSIS — F1011 Alcohol abuse, in remission: Secondary | ICD-10-CM | POA: Diagnosis present

## 2020-11-29 DIAGNOSIS — M25512 Pain in left shoulder: Secondary | ICD-10-CM | POA: Diagnosis present

## 2020-11-29 DIAGNOSIS — R700 Elevated erythrocyte sedimentation rate: Secondary | ICD-10-CM

## 2020-11-29 DIAGNOSIS — R7982 Elevated C-reactive protein (CRP): Secondary | ICD-10-CM | POA: Insufficient documentation

## 2020-11-29 DIAGNOSIS — R1032 Left lower quadrant pain: Secondary | ICD-10-CM | POA: Diagnosis present

## 2020-11-29 DIAGNOSIS — E559 Vitamin D deficiency, unspecified: Secondary | ICD-10-CM

## 2020-11-29 DIAGNOSIS — R768 Other specified abnormal immunological findings in serum: Secondary | ICD-10-CM | POA: Diagnosis present

## 2020-11-29 DIAGNOSIS — G894 Chronic pain syndrome: Secondary | ICD-10-CM | POA: Diagnosis present

## 2020-11-29 DIAGNOSIS — E790 Hyperuricemia without signs of inflammatory arthritis and tophaceous disease: Secondary | ICD-10-CM | POA: Diagnosis present

## 2020-11-29 DIAGNOSIS — M792 Neuralgia and neuritis, unspecified: Secondary | ICD-10-CM | POA: Insufficient documentation

## 2020-11-29 DIAGNOSIS — M25562 Pain in left knee: Secondary | ICD-10-CM | POA: Insufficient documentation

## 2020-11-29 DIAGNOSIS — G8929 Other chronic pain: Secondary | ICD-10-CM | POA: Diagnosis present

## 2020-11-29 MED ORDER — GABAPENTIN 300 MG PO CAPS: ORAL_CAPSULE | ORAL | 0 refills | Status: DC

## 2020-11-29 MED ORDER — VITAMIN D3 125 MCG (5000 UT) PO CAPS: 1.0000 | ORAL_CAPSULE | Freq: Every day | ORAL | 2 refills | Status: DC

## 2020-11-29 MED ORDER — CALCIUM CARBONATE 1500 (600 CA) MG PO TABS: 600.0000 mg | ORAL_TABLET | Freq: Two times a day (BID) | ORAL | 2 refills | Status: AC

## 2020-11-29 MED ORDER — MAGNESIUM OXIDE -MG SUPPLEMENT 500 MG PO CAPS: 1.0000 | ORAL_CAPSULE | Freq: Every day | ORAL | 2 refills | Status: AC

## 2020-11-29 NOTE — Patient Instructions (Signed)
____________________________________________________________________________________________  Preparing for Procedure with Sedation  Procedure appointments are limited to planned procedures: . No Prescription Refills. . No disability issues will be discussed. . No medication changes will be discussed.  Instructions: . Oral Intake: Do not eat or drink anything for at least 8 hours prior to your procedure. (Exception: Blood Pressure Medication. See below.) . Transportation: Unless otherwise stated by your physician, you may drive yourself after the procedure. . Blood Pressure Medicine: Do not forget to take your blood pressure medicine with a sip of water the morning of the procedure. If your Diastolic (lower reading)is above 100 mmHg, elective cases will be cancelled/rescheduled. . Blood thinners: These will need to be stopped for procedures. Notify our staff if you are taking any blood thinners. Depending on which one you take, there will be specific instructions on how and when to stop it. . Diabetics on insulin: Notify the staff so that you can be scheduled 1st case in the morning. If your diabetes requires high dose insulin, take only  of your normal insulin dose the morning of the procedure and notify the staff that you have done so. . Preventing infections: Shower with an antibacterial soap the morning of your procedure. . Build-up your immune system: Take 1000 mg of Vitamin C with every meal (3 times a day) the day prior to your procedure. . Antibiotics: Inform the staff if you have a condition or reason that requires you to take antibiotics before dental procedures. . Pregnancy: If you are pregnant, call and cancel the procedure. . Sickness: If you have a cold, fever, or any active infections, call and cancel the procedure. . Arrival: You must be in the facility at least 30 minutes prior to your scheduled procedure. . Children: Do not bring children with you. . Dress appropriately:  Bring dark clothing that you would not mind if they get stained. . Valuables: Do not bring any jewelry or valuables.  Reasons to call and reschedule or cancel your procedure: (Following these recommendations will minimize the risk of a serious complication.) . Surgeries: Avoid having procedures within 2 weeks of any surgery. (Avoid for 2 weeks before or after any surgery). . Flu Shots: Avoid having procedures within 2 weeks of a flu shots or . (Avoid for 2 weeks before or after immunizations). . Barium: Avoid having a procedure within 7-10 days after having had a radiological study involving the use of radiological contrast. (Myelograms, Barium swallow or enema study). . Heart attacks: Avoid any elective procedures or surgeries for the initial 6 months after a "Myocardial Infarction" (Heart Attack). . Blood thinners: It is imperative that you stop these medications before procedures. Let us know if you if you take any blood thinner.  . Infection: Avoid procedures during or within two weeks of an infection (including chest colds or gastrointestinal problems). Symptoms associated with infections include: Localized redness, fever, chills, night sweats or profuse sweating, burning sensation when voiding, cough, congestion, stuffiness, runny nose, sore throat, diarrhea, nausea, vomiting, cold or Flu symptoms, recent or current infections. It is specially important if the infection is over the area that we intend to treat. . Heart and lung problems: Symptoms that may suggest an active cardiopulmonary problem include: cough, chest pain, breathing difficulties or shortness of breath, dizziness, ankle swelling, uncontrolled high or unusually low blood pressure, and/or palpitations. If you are experiencing any of these symptoms, cancel your procedure and contact your primary care physician for an evaluation.  Remember:  Regular Business hours are:    Monday to Thursday 8:00 AM to 4:00 PM  Provider's  Schedule: Milinda Pointer, MD:  Procedure days: Tuesday and Thursday 7:30 AM to 4:00 PM  Gillis Santa, MD:  Procedure days: Monday and Wednesday 7:30 AM to 4:00 PM ____________________________________________________________________________________________   ____________________________________________________________________________________________  Blood Thinners  IMPORTANT NOTICE:  If you take any of these, make sure to notify the nursing staff.  Failure to do so may result in injury.  Recommended time intervals to stop and restart blood-thinners, before & after invasive procedures  Generic Name Brand Name Stop Time. Must be stopped at least this long before procedures. After procedures, wait at least this long before re-starting.  Abciximab Reopro 15 days 2 hrs  Alteplase Activase 10 days 10 days  Anagrelide Agrylin    Apixaban Eliquis 3 days 6 hrs  Cilostazol Pletal 3 days 5 hrs  Clopidogrel Plavix 7-10 days 2 hrs  Dabigatran Pradaxa 5 days 6 hrs  Dalteparin Fragmin 24 hours 4 hrs  Dipyridamole Aggrenox 11days 2 hrs  Edoxaban Lixiana; Savaysa 3 days 2 hrs  Enoxaparin  Lovenox 24 hours 4 hrs  Eptifibatide Integrillin 8 hours 2 hrs  Fondaparinux  Arixtra 72 hours 12 hrs  Prasugrel Effient 7-10 days 6 hrs  Reteplase Retavase 10 days 10 days  Rivaroxaban Xarelto 3 days 6 hrs  Ticagrelor Brilinta 5-7 days 6 hrs  Ticlopidine Ticlid 10-14 days 2 hrs  Tinzaparin Innohep 24 hours 4 hrs  Tirofiban Aggrastat 8 hours 2 hrs  Warfarin Coumadin 5 days 2 hrs   Other medications with blood-thinning effects  Product indications Generic (Brand) names Note  Cholesterol Lipitor Stop 4 days before procedure  Blood thinner (injectable) Heparin (LMW or LMWH Heparin) Stop 24 hours before procedure  Cancer Ibrutinib (Imbruvica) Stop 7 days before procedure  Malaria/Rheumatoid Hydroxychloroquine (Plaquenil) Stop 11 days before procedure  Thrombolytics  10 days before or after procedures    Over-the-counter (OTC) Products with blood-thinning effects  Product Common names Stop Time  Aspirin > 325 mg Goody Powders, Excedrin, etc. 11 days  Aspirin ? 81 mg  7 days  Fish oil  4 days  Garlic supplements  7 days  Ginkgo biloba  36 hours  Ginseng  24 hours  NSAIDs Ibuprofen, Naprosyn, etc. 3 days  Vitamin E  4 days   ____________________________________________________________________________________________  ____________________________________________________________________________________________  General Risks and Possible Complications  Patient Responsibilities: It is important that you read this as it is part of your informed consent. It is our duty to inform you of the risks and possible complications associated with treatments offered to you. It is your responsibility as a patient to read this and to ask questions about anything that is not clear or that you believe was not covered in this document.  Patient's Rights: You have the right to refuse treatment. You also have the right to change your mind, even after initially having agreed to have the treatment done. However, under this last option, if you wait until the last second to change your mind, you may be charged for the materials used up to that point.  Introduction: Medicine is not an Chief Strategy Officer. Everything in Medicine, including the lack of treatment(s), carries the potential for danger, harm, or loss (which is by definition: Risk). In Medicine, a complication is a secondary problem, condition, or disease that can aggravate an already existing one. All treatments carry the risk of possible complications. The fact that a side effects or complications occurs, does not imply that the treatment was conducted  incorrectly. It must be clearly understood that these can happen even when everything is done following the highest safety standards.  No treatment: You can choose not to proceed with the proposed treatment  alternative. The "PRO(s)" would include: avoiding the risk of complications associated with the therapy. The "CON(s)" would include: not getting any of the treatment benefits. These benefits fall under one of three categories: diagnostic; therapeutic; and/or palliative. Diagnostic benefits include: getting information which can ultimately lead to improvement of the disease or symptom(s). Therapeutic benefits are those associated with the successful treatment of the disease. Finally, palliative benefits are those related to the decrease of the primary symptoms, without necessarily curing the condition (example: decreasing the pain from a flare-up of a chronic condition, such as incurable terminal cancer).  General Risks and Complications: These are associated to most interventional treatments. They can occur alone, or in combination. They fall under one of the following six (6) categories: no benefit or worsening of symptoms; bleeding; infection; nerve damage; allergic reactions; and/or death. 1. No benefits or worsening of symptoms: In Medicine there are no guarantees, only probabilities. No healthcare provider can ever guarantee that a medical treatment will work, they can only state the probability that it may. Furthermore, there is always the possibility that the condition may worsen, either directly, or indirectly, as a consequence of the treatment. 2. Bleeding: This is more common if the patient is taking a blood thinner, either prescription or over the counter (example: Goody Powders, Fish oil, Aspirin, Garlic, etc.), or if suffering a condition associated with impaired coagulation (example: Hemophilia, cirrhosis of the liver, low platelet counts, etc.). However, even if you do not have one on these, it can still happen. If you have any of these conditions, or take one of these drugs, make sure to notify your treating physician. 3. Infection: This is more common in patients with a compromised immune  system, either due to disease (example: diabetes, cancer, human immunodeficiency virus [HIV], etc.), or due to medications or treatments (example: therapies used to treat cancer and rheumatological diseases). However, even if you do not have one on these, it can still happen. If you have any of these conditions, or take one of these drugs, make sure to notify your treating physician. 4. Nerve Damage: This is more common when the treatment is an invasive one, but it can also happen with the use of medications, such as those used in the treatment of cancer. The damage can occur to small secondary nerves, or to large primary ones, such as those in the spinal cord and brain. This damage may be temporary or permanent and it may lead to impairments that can range from temporary numbness to permanent paralysis and/or brain death. 5. Allergic Reactions: Any time a substance or material comes in contact with our body, there is the possibility of an allergic reaction. These can range from a mild skin rash (contact dermatitis) to a severe systemic reaction (anaphylactic reaction), which can result in death. 6. Death: In general, any medical intervention can result in death, most of the time due to an unforeseen complication. ____________________________________________________________________________________________   ______________________________________________________________________________________________  Body mass index (BMI)  Body mass index (BMI) is a common tool for deciding whether a person has an appropriate body weight.  It measures a persons weight in relation to their height.   According to the [employer] of health (NIH): Marland Kitchen A BMI of less than 18.5 means that a person is underweight. . A BMI of  between 18.5 and 24.9 is ideal. . A BMI of between 25 and 29.9 is overweight. . A BMI over 30 indicates obesity.  Weight Management Required  URGENT: Your weight has been found to be adversely  affecting your health.  Dear [name]:  Your current Estimated body mass index is 43.54 kg/m as calculated from the following:   Height as of this encounter: _0  (1.702 m).   Weight as of this encounter: 278 lb (126.1 kg).  Please use the table below to identify your weight category and associated incidence of chronic pain, secondary to your weight.  Body Mass Index (BMI) Classification BMI level (kg/m2) Category Associated incidence of chronic pain  <18  Underweight   18.5-24.9 Ideal body weight   25-29.9 Overweight  20%  30-34.9 Obese (Class I)  68%  35-39.9 Severe obesity (Class II)  136%  >40 Extreme obesity (Class III)  254%   In addition: You will be considered "Morbidly Obese", if your BMI is above 30 and you have one or more of the following conditions which are known to be caused and/or directly associated with obesity: 1.    Type 2 Diabetes (Which in turn can lead to cardiovascular diseases (CVD), stroke, peripheral vascular diseases (PVD), retinopathy, nephropathy, and neuropathy) 2.    Cardiovascular Disease (High Blood Pressure; Congestive Heart Failure; High Cholesterol; Coronary Artery Disease; Angina; or History of Heart Attacks) 3.    Breathing problems (Asthma; obesity-hypoventilation syndrome; obstructive sleep apnea; chronic inflammatory airway disease; reactive airway disease; or shortness of breath) 4.    Chronic kidney disease 5.    Liver disease (nonalcoholic fatty liver disease) 6.    High blood pressure 7.    Acid reflux (gastroesophageal reflux disease; heartburn) 8.    Osteoarthritis (OA) (with any of the following: hip pain; knee pain; and/or low back pain) 9.    Low back pain (Lumbar Facet Syndrome; and/or Degenerative Disc Disease) 10.  Hip pain (Osteoarthritis of hip) (For every 1 lbs of added body weight, there is a 2 lbs increase in pressure inside of each hip articulation. 1:2 mechanical relationship) 11.  Knee pain (Osteoarthritis of knee) (For  every 1 lbs of added body weight, there is a 4 lbs increase in pressure inside of each knee articulation. 1:4 mechanical relationship) (patients with a BMI>30 kg/m2 were 6.8 times more likely to develop knee OA than normal-weight individuals) 12.  Cancer: Epidemiological studies have shown that obesity is a risk factor for: post-menopausal breast cancer; cancers of the endometrium, colon and kidney cancer; malignant adenomas of the oesophagus. Obese subjects have an approximately 1.5-3.5-fold increased risk of developing these cancers compared with normal-weight subjects, and it has been estimated that between 15 and 45% of these cancers can be attributed to overweight. More recent studies suggest that obesity may also increase the risk of other types of cancer, including pancreatic, hepatic and gallbladder cancer. (Ref: Obesity and cancer. Pischon T, Nthlings U, [employer] H. Proc Nutr Soc. 2008 May;67(2):128-45. doi: 75.6433/I9518841660630160.) The International Agency for Research on Cancer (IARC) has identified 13 cancers associated with overweight and obesity: meningioma, multiple myeloma, adenocarcinoma of the esophagus, and cancers of the thyroid, postmenopausal breast cancer, gallbladder, stomach, liver, pancreas, kidney, ovaries, uterus, colon and rectal (colorectal) cancers. 19 percent of all cancers diagnosed in women and 24 percent of those diagnosed in men are associated with overweight and obesity.  Recommendation: At this point it is urgent that you take a step back and concentrate in loosing weight.  Dedicate 100% of your efforts on this task. Nothing else will improve your health more than bringing your weight down and your BMI to less than 30. If you are here, you probably have chronic pain. We know that most chronic pain patients have difficulty exercising secondary to their pain. For this reason, you must rely on proper nutrition and diet in order to lose the weight. If your BMI is above 40, you  should seriously consider bariatric surgery. A realistic goal is to lose 10% of your body weight over a period of 12 months.  Be honest to yourself, if over time you have unsuccessfully tried to lose weight, then it is time for you to seek professional help and to enter a medically supervised weight management program, and/or undergo bariatric surgery. Stop procrastinating.   Pain management considerations:  1.    Pharmacological Problems: Be advised that the use of opioid analgesics (oxycodone; hydrocodone; morphine; methadone; codeine; and all of their derivatives) have been associated with decreased metabolism and weight gain.  For this reason, should we see that you are unable to lose weight while taking these medications, it may become necessary for [place] to taper down and indefinitely discontinue them.  2.    Technical Problems: The incidence of successful interventional therapies decreases as the patient's BMI increases. It is much more difficult to accomplish a safe and effective interventional therapy on a patient with a BMI above 35. 3.    Radiation Exposure Problems: The x-rays machine, used to accomplish injection therapies, will automatically increase their x-ray output in order to capture an appropriate bone image. This means that radiation exposure increases exponentially with the patient's BMI. (The higher the BMI, the higher the radiation exposure.) Although the level of radiation used at a given time is still safe to the patient, it is not for the physician and/or assisting staff. Unfortunately, radiation exposure is accumulative. Because physicians and the staff have to do procedures and be exposed on a daily basis, this can result in health problems such as cancer and radiation burns. Radiation exposure to the staff is monitored by the radiation batches that they wear. The exposure levels are reported back to the staff on a quarterly basis. Depending on levels of exposure, physicians and staff  may be obligated by law to decrease this exposure. This means that they have the right and obligation to refuse providing therapies where they may be overexposed to radiation. For this reason, physicians may decline to offer therapies such as radiofrequency ablation or implants to patients with a BMI above 40. 4.    Current Trends: Be advised that the current trend is to no longer offer certain therapies to patients with a BMI equal to, or above 35, due to increase perioperative risks, increased technical procedural difficulties, and excessive radiation exposure to healthcare personnel.  ______________________________________________________________________________________________

## 2020-11-29 NOTE — Progress Notes (Signed)
Nursing Pain Medication Assessment:  Safety precautions to be maintained throughout the outpatient stay will include: orient to surroundings, keep bed in low position, maintain call bell within reach at all times, provide assistance with transfer out of bed and ambulation.  Medication Inspection Compliance: Pill count conducted under aseptic conditions, in front of the patient. Neither the pills nor the bottle was removed from the patient's sight at any time. Once count was completed pills were immediately returned to the patient in their original bottle.  Medication: Oxycodone IR Pill/Patch Count: 14 of 120 pills remain Pill/Patch Appearance: Markings consistent with prescribed medication Bottle Appearance: Standard pharmacy container. Clearly labeled. Filled Date: 45 / 08 / 2021 Last Medication intake:  couple of days ago.

## 2020-11-30 ENCOUNTER — Encounter: Payer: Self-pay | Admitting: Pain Medicine

## 2020-11-30 ENCOUNTER — Ambulatory Visit (HOSPITAL_BASED_OUTPATIENT_CLINIC_OR_DEPARTMENT_OTHER): Payer: Medicare HMO | Admitting: Pain Medicine

## 2020-11-30 ENCOUNTER — Ambulatory Visit
Admission: RE | Admit: 2020-11-30 | Discharge: 2020-11-30 | Disposition: A | Discharge status: Home / Self Care | Payer: Medicare HMO | Source: Ambulatory Visit | Attending: Pain Medicine | Admitting: Pain Medicine

## 2020-11-30 ENCOUNTER — Telehealth: Payer: Self-pay

## 2020-11-30 VITALS — BP 122/62 | HR 70 | Temp 96.3°F | Resp 18 | Ht 67.0 in | Wt 270.0 lb

## 2020-11-30 DIAGNOSIS — M1612 Unilateral primary osteoarthritis, left hip: Secondary | ICD-10-CM | POA: Diagnosis present

## 2020-11-30 DIAGNOSIS — Z7901 Long term (current) use of anticoagulants: Secondary | ICD-10-CM

## 2020-11-30 DIAGNOSIS — M25552 Pain in left hip: Secondary | ICD-10-CM | POA: Diagnosis present

## 2020-11-30 DIAGNOSIS — M0580 Other rheumatoid arthritis with rheumatoid factor of unspecified site: Secondary | ICD-10-CM

## 2020-11-30 DIAGNOSIS — M1652 Unilateral post-traumatic osteoarthritis, left hip: Secondary | ICD-10-CM | POA: Diagnosis present

## 2020-11-30 DIAGNOSIS — G8929 Other chronic pain: Secondary | ICD-10-CM | POA: Insufficient documentation

## 2020-11-30 DIAGNOSIS — R1032 Left lower quadrant pain: Secondary | ICD-10-CM | POA: Insufficient documentation

## 2020-11-30 MED ORDER — MIDAZOLAM HCL 5 MG/5ML IJ SOLN
1.0000 mg | INTRAMUSCULAR | Status: DC | PRN
  Administered 2020-11-30: 10:00:00 1 mg via INTRAVENOUS

## 2020-11-30 MED ORDER — LIDOCAINE HCL 2 % IJ SOLN
INTRAMUSCULAR | Status: AC
  Filled 2020-11-30: qty 20

## 2020-11-30 MED ORDER — LIDOCAINE HCL 2 % IJ SOLN
20.0000 mL | Freq: Once | INTRAMUSCULAR | Status: AC
  Administered 2020-11-30: 10:00:00 400 mg

## 2020-11-30 MED ORDER — METHYLPREDNISOLONE ACETATE 80 MG/ML IJ SUSP
80.0000 mg | Freq: Once | INTRAMUSCULAR | Status: AC
  Administered 2020-11-30: 10:00:00 80 mg via INTRA_ARTICULAR

## 2020-11-30 MED ORDER — MIDAZOLAM HCL 5 MG/5ML IJ SOLN
INTRAMUSCULAR | Status: AC
  Filled 2020-11-30: qty 5

## 2020-11-30 MED ORDER — FENTANYL CITRATE (PF) 100 MCG/2ML IJ SOLN
25.0000 ug | INTRAMUSCULAR | Status: DC | PRN
  Administered 2020-11-30: 50 ug via INTRAVENOUS

## 2020-11-30 MED ORDER — LACTATED RINGERS IV SOLN
1000.0000 mL | Freq: Once | INTRAVENOUS | Status: AC
  Administered 2020-11-30: 10:00:00 1000 mL via INTRAVENOUS

## 2020-11-30 MED ORDER — IOHEXOL 180 MG/ML  SOLN
10.0000 mL | Freq: Once | INTRAMUSCULAR | Status: AC
  Administered 2020-11-30: 10:00:00 10 mL via INTRA_ARTICULAR

## 2020-11-30 MED ORDER — METHYLPREDNISOLONE ACETATE 80 MG/ML IJ SUSP
INTRAMUSCULAR | Status: AC
  Filled 2020-11-30: qty 1

## 2020-11-30 MED ORDER — ROPIVACAINE HCL 2 MG/ML IJ SOLN
INTRAMUSCULAR | Status: AC
  Filled 2020-11-30: qty 10

## 2020-11-30 MED ORDER — ROPIVACAINE HCL 2 MG/ML IJ SOLN
9.0000 mL | Freq: Once | INTRAMUSCULAR | Status: AC
  Administered 2020-11-30: 10:00:00 9 mL via INTRA_ARTICULAR

## 2020-11-30 MED ORDER — IOHEXOL 180 MG/ML  SOLN
INTRAMUSCULAR | Status: AC
  Filled 2020-11-30: qty 20

## 2020-11-30 MED ORDER — FENTANYL CITRATE (PF) 100 MCG/2ML IJ SOLN
INTRAMUSCULAR | Status: AC
  Filled 2020-11-30: qty 2

## 2020-11-30 NOTE — Telephone Encounter (Signed)
Oscal has been discontinued from the market, they want to use a substitute. Please call them. [phone]

## 2020-11-30 NOTE — Progress Notes (Signed)
PROVIDER NOTE: Information contained herein reflects review and annotations entered in association with encounter. Interpretation of such information and data should be left to medically-trained personnel. Information provided to patient can be located elsewhere in the medical record under "Patient Instructions". Document created using STT-dictation technology, any transcriptional errors that may result from process are unintentional.    Patient: [name]  Service Category: Procedure  Provider: Gaspar Cola, MD  DOB: 10-29-1935  DOS: 11/30/2020  Location: [facility]  MRN: [id number]  Setting: Ambulatory - outpatient  Referring Provider: Marguerita Merles, MD  Type: Established Patient  Specialty: Interventional Pain Management  PCP: Marguerita Merles, MD   Primary Reason for Visit: Interventional Pain Management Treatment. CC: Hip Pain (left)  Procedure #1:  Anesthesia, Analgesia, Anxiolysis:  Type: Intra-Articular Hip Injection #1  Primary Purpose: Diagnostic Region: Posterolateral hip joint area. Level: Lower pelvic and hip joint level. Target Area: Superior aspect of the hip joint cavity, going thru the superior portion of the capsular ligament. Approach: Posterolateral approach. Laterality: Left  Type: Moderate (Conscious) Sedation combined with Local Anesthesia Indication(s): Analgesia and Anxiety Route: Intravenous (IV) IV Access: Secured Sedation: Meaningful verbal contact was maintained at all times during the procedure  Local Anesthetic: Lidocaine 1-2%  Position: Lateral Decubitus with bad side up Area Prepped: Entire Posterolateral hip area. DuraPrep (Iodine Povacrylex [0.7% available iodine] and Isopropyl Alcohol, 74% w/w)   Procedure #2:    Type: Subgluteus Maximus Bursa Injection #1  Primary Purpose: Diagnostic Region: Upper (proximal) Femoral Region Level: Hip Joint Target Area: Superior aspect of the hip joint cavity, going thru the superior  portion of the capsular ligament. Approach: Posterolateral approach Laterality: Left     Indications: 1. Chronic hip pain (1ry area of Pain) (Left)   2. Chronic groin pain (Left)   3. Osteoarthritis of hip (Left)   4. Polyarthritis with positive rheumatoid factor (HCC)   5. Unilateral post-traumatic osteoarthritis of hip (Left)   6. Chronic anticoagulation (Plavix)    Pain Score: Pre-procedure: 10-Worst pain ever/10 Post-procedure: 8 /10   The patient comes into the clinics for the diagnostic left hip injection.  Upon arrival we learned that he took good upon himself not to restart the Plavix after his last procedure.  Today I had a long conversation with his daughter to make sure that he keeps an eye on him and that he is compliant with his therapy.  I stressed the fact that not taking the Plavix could lead to thromboembolic disease such as stroke, pulmonary embolism, embolic ischemic myocardial infarction, and even a septic necrosis of joints secondary to embolic phenomena.  She understood and indicated that she would keep an eye on him.  Pre-op H&P Assessment:  [name] is a 85 y.o. (year old), male patient, seen today for interventional treatment. He  has a past surgical history that includes Total hip arthroplasty; Coronary/Graft Acute MI Revascularization (N/A, 04/01/2019); CORONARY ANGIOGRAPHY (N/A, 04/01/2019); Cardiac catheterization; and Coronary angioplasty with stent. [name] has a current medication list which includes the following prescription(s): adult aspirin regimen, albuterol, amlodipine, atorvastatin, calcium carbonate, carvedilol, vitamin d3, clopidogrel, fluticasone, furosemide, gabapentin, ipratropium, isosorbide mononitrate, magnesium oxide, nitroglycerin, quetiapine, tizanidine, and trelegy ellipta, and the following Facility-Administered Medications: fentanyl and midazolam. His primarily concern today is the Hip Pain (left)  Initial Vital Signs:  Pulse/HCG Rate: 70ECG  Heart Rate: 75 Temp: (!) 96.3 F (35.7 C) Resp: 20 BP: (!) 92/54 SpO2: 97 %  BMI: Estimated body mass  index is 42.29 kg/m as calculated from the following:   Height as of this encounter: 5\' 7"  (1.702 m).   Weight as of this encounter: 270 lb (122.5 kg).  Risk Assessment: Allergies: Reviewed. He is allergic to chlorthalidone, ace inhibitors, hydrochlorothiazide, lisinopril, losartan, other, and benazepril-hydrochlorothiazide.  Allergy Precautions: None required Coagulopathies: Reviewed. None identified.  Blood-thinner therapy: None at this time Active Infection(s): Reviewed. None identified. [name] is afebrile  Site Confirmation: [name] was asked to confirm the procedure and laterality before marking the site Procedure checklist: Completed Consent: Before the procedure and under the influence of no sedative(s), amnesic(s), or anxiolytics, the patient was informed of the treatment options, risks and possible complications. To fulfill our ethical and legal obligations, as recommended by the American Medical Association's Code of Ethics, I have informed the patient of my clinical impression; the nature and purpose of the treatment or procedure; the risks, benefits, and possible complications of the intervention; the alternatives, including doing nothing; the risk(s) and benefit(s) of the alternative treatment(s) or procedure(s); and the risk(s) and benefit(s) of doing nothing. The patient was provided information about the general risks and possible complications associated with the procedure. These may include, but are not limited to: failure to achieve desired goals, infection, bleeding, organ or nerve damage, allergic reactions, paralysis, and death. In addition, the patient was informed of those risks and complications associated to the procedure, such as failure to decrease pain; infection; bleeding; organ or nerve damage with subsequent damage to sensory, motor, and/or autonomic systems,  resulting in permanent pain, numbness, and/or weakness of one or several areas of the body; allergic reactions; (i.e.: anaphylactic reaction); and/or death. Furthermore, the patient was informed of those risks and complications associated with the medications. These include, but are not limited to: allergic reactions (i.e.: anaphylactic or anaphylactoid reaction(s)); adrenal axis suppression; blood sugar elevation that in diabetics may result in ketoacidosis or comma; water retention that in patients with history of congestive heart failure may result in shortness of breath, pulmonary edema, and decompensation with resultant heart failure; weight gain; swelling or edema; medication-induced neural toxicity; particulate matter embolism and blood vessel occlusion with resultant organ, and/or nervous system infarction; and/or aseptic necrosis of one or more joints. Finally, the patient was informed that Medicine is not an exact science; therefore, there is also the possibility of unforeseen or unpredictable risks and/or possible complications that may result in a catastrophic outcome. The patient indicated having understood very clearly. We have given the patient no guarantees and we have made no promises. Enough time was given to the patient to ask questions, all of which were answered to the patient's satisfaction. [name] has indicated that he wanted to continue with the procedure. Attestation: I, the ordering provider, attest that I have discussed with the patient the benefits, risks, side-effects, alternatives, likelihood of achieving goals, and potential problems during recovery for the procedure that I have provided informed consent. Date   Time: 11/30/2020  8:57 AM  Pre-Procedure Preparation:  Monitoring: As per clinic protocol. Respiration, ETCO2, SpO2, BP, heart rate and rhythm monitor placed and checked for adequate function Safety Precautions: Patient was assessed for positional comfort and pressure  points before starting the procedure. Time-out: I initiated and conducted the "Time-out" before starting the procedure, as per protocol. The patient was asked to participate by confirming the accuracy of the "Time Out" information. Verification of the correct person, site, and procedure were performed and confirmed by me, the nursing staff, and the  patient. "Time-out" conducted as per Joint Commission's Universal Protocol (UP.01.01.01). Time: [phone]  Description of Procedure #1:  Safety Precautions: Aspiration looking for blood return was conducted prior to all injections. At no point did we inject any substances, as a needle was being advanced. No attempts were made at seeking any paresthesias. Safe injection practices and needle disposal techniques used. Medications properly checked for expiration dates. SDV (single dose vial) medications used. Description of the Procedure: Protocol guidelines were followed. The patient was placed in position over the fluoroscopy table. The target area was identified and the area prepped in the usual manner. Skin & deeper tissues infiltrated with local anesthetic. Appropriate amount of time allowed to pass for local anesthetics to take effect. The procedure needles were then advanced to the target area. Proper needle placement secured. Negative aspiration confirmed. Solution injected in intermittent fashion, asking for systemic symptoms every 0.5cc of injectate. The needles were then removed and the area cleansed, making sure to leave some of the prepping solution back to take advantage of its long term bactericidal properties.  Start Time: 0943 hrs. Materials:  Needle(s) Type: Spinal Needle Gauge: 22G Length: 7-in Medication(s): Please see orders for medications and dosing details.  Imaging Guidance (Non-Spinal):          Type of Imaging Technique: Fluoroscopy Guidance (Non-Spinal) Indication(s): Assistance in needle guidance and placement for procedures requiring  needle placement in or near specific anatomical locations not easily accessible without such assistance. Exposure Time: Please see nurses notes. Contrast: Before injecting any contrast, we confirmed that the patient did not have an allergy to iodine, shellfish, or radiological contrast. Once satisfactory needle placement was completed at the desired level, radiological contrast was injected. Contrast injected under live fluoroscopy. No contrast complications. See chart for type and volume of contrast used. Fluoroscopic Guidance: I was personally present during the use of fluoroscopy. "Tunnel Vision Technique" used to obtain the best possible view of the target area. Parallax error corrected before commencing the procedure. "Direction-depth-direction" technique used to introduce the needle under continuous pulsed fluoroscopy. Once target was reached, antero-posterior, oblique, and lateral fluoroscopic projection used confirm needle placement in all planes. Images permanently stored in EMR. Interpretation: I personally interpreted the imaging intraoperatively. Adequate needle placement confirmed in multiple planes. Appropriate spread of contrast into desired area was observed. No evidence of afferent or efferent intravascular uptake. Permanent images saved into the patient's record.  Description of Procedure #2:  Description of the Procedure: Skin & deeper tissues infiltrated with local anesthetic. Appropriate amount of time allowed to pass for local anesthetics to take effect. The procedure needles were then advanced to the target area. Proper needle placement secured. Negative aspiration confirmed. Solution injected in intermittent fashion, asking for systemic symptoms every 0.5cc of injectate. The needles were then removed and the area cleansed, making sure to leave some of the prepping solution back to take advantage of its long term bactericidal properties.  Vitals:   11/30/20 0948 11/30/20 0958  11/30/20 1008 11/30/20 1018  BP: 132/66 118/72 122/63 122/62  Pulse:      Resp: 17 18 16 18   Temp:      SpO2: 95% 96% 97% 98%  Weight:      Height:         End Time: 0948 hrs.      Materials:  Needle(s) Type: Spinal Needle Gauge: 22G Length: 7-in Medication(s): Please see orders for medications and dosing details.  Imaging Guidance (Non-Spinal):  Type of Imaging Technique: Fluoroscopy Guidance (Non-Spinal) Indication(s): Assistance in needle guidance and placement for procedures requiring needle placement in or near specific anatomical locations not easily accessible without such assistance. Exposure Time: Please see nurses notes. Contrast: Before injecting any contrast, we confirmed that the patient did not have an allergy to iodine, shellfish, or radiological contrast. Once satisfactory needle placement was completed at the desired level, radiological contrast was injected. Contrast injected under live fluoroscopy. No contrast complications. See chart for type and volume of contrast used. Fluoroscopic Guidance: I was personally present during the use of fluoroscopy. "Tunnel Vision Technique" used to obtain the best possible view of the target area. Parallax error corrected before commencing the procedure. "Direction-depth-direction" technique used to introduce the needle under continuous pulsed fluoroscopy. Once target was reached, antero-posterior, oblique, and lateral fluoroscopic projection used confirm needle placement in all planes. Images permanently stored in EMR. Interpretation: I personally interpreted the imaging intraoperatively. Adequate needle placement confirmed in multiple planes. Appropriate spread of contrast into desired area was observed. No evidence of afferent or efferent intravascular uptake. Permanent images saved into the patient's record.  Antibiotic Prophylaxis:   Anti-infectives (From admission, onward)   None     Indication(s): None  identified  Post-operative Assessment:  Post-procedure Vital Signs:  Pulse/HCG Rate: 7072 Temp: (!) 96.3 F (35.7 C) Resp: 18 BP: 122/62 SpO2: 98 %  EBL: None  Complications: No immediate post-treatment complications observed by team, or reported by patient.  Note: The patient tolerated the entire procedure well. A repeat set of vitals were taken after the procedure and the patient was kept under observation following institutional policy, for this type of procedure. Post-procedural neurological assessment was performed, showing return to baseline, prior to discharge. The patient was provided with post-procedure discharge instructions, including a section on how to identify potential problems. Should any problems arise concerning this procedure, the patient was given instructions to immediately contact us, at any time, without hesitation. In any case, we plan to contact the patient by telephone for a follow-up status report regarding this interventional procedure.  Comments:  No additional relevant information.  Plan of Care  Orders:  Orders Placed This Encounter  Procedures   HIP INJECTION    Scheduling Instructions:     Side: Left-sided     Sedation: Patient's choice.     Timeframe: Today   CT HIP LEFT WO CONTRAST    Standing Status:   Future    Standing Expiration Date:   12/31/2020    Scheduling Instructions:     Imaging must be done as soon as possible. Inform patient that order will expire within 30 days and I will not renew it.    Order Specific Question:   Preferred imaging location?    Answer:   [facility]    Order Specific Question:   Call Results- Best Contact Number?    Answer:   [phone]) [phone] ([facility])    Order Specific Question:   Radiology Contrast Protocol - do NOT remove file path    Answer:   \[email or web address]   [facility] C-ARM 1-60 MIN NO REPORT    Intraoperative interpretation by procedural physician at  [place].    Standing Status:   Standing    Number of Occurrences:   1    Order Specific Question:   Reason for exam:    Answer:   Assistance in needle guidance and placement for procedures requiring needle placement in or near specific anatomical locations  not easily accessible without such assistance.   Informed Consent Details: Physician/Practitioner Attestation; Transcribe to consent form and obtain patient signature    Nursing Order: Transcribe to consent form and obtain patient signature. Note: Always confirm laterality of pain with [name], before procedure.    Order Specific Question:   Physician/Practitioner attestation of informed consent for procedure/surgical case    Answer:   I, the physician/practitioner, attest that I have discussed with the patient the benefits, risks, side effects, alternatives, likelihood of achieving goals and potential problems during recovery for the procedure that I have provided informed consent.    Order Specific Question:   Procedure    Answer:   Hip injection    Order Specific Question:   Physician/Practitioner performing the procedure    Answer:   Peyton Spengler A. Dossie Arbour, MD    Order Specific Question:   Indication/Reason    Answer:   Hip Joint Pain (Arthralgia)   Care order/instruction: Please confirm that the patient has stopped the Plavix (Clopidogrel) x 7-10 days prior to procedure or surgery.    Please confirm that the patient has stopped the Plavix (Clopidogrel) x 7-10 days prior to procedure or surgery.    Standing Status:   Standing    Number of Occurrences:   1   Provide equipment / supplies at bedside    "Block Tray" (Disposable   single use) Needle type: SpinalSpinal Amount/quantity: 1 Size: Long (7-inch) Gauge: 22G    Standing Status:   Standing    Number of Occurrences:   1    Order Specific Question:   Specify    Answer:   Block Tray   Bleeding precautions    Standing Status:   Standing    Number of Occurrences:    1   Chronic Opioid Analgesic:  Tramadol 50 mg, 1 tab p.o. 4 times daily (200 mg/day of tramadol) (20 MME/day) MME/day: 20 mg/day   Medications ordered for procedure: Meds ordered this encounter  Medications   iohexol (OMNIPAQUE) 180 MG/ML injection 10 mL    Must be Myelogram-compatible. If not available, you may substitute with a water-soluble, non-ionic, hypoallergenic, myelogram-compatible radiological contrast medium.   lidocaine (XYLOCAINE) 2 % (with pres) injection 400 mg   lactated ringers infusion 1,000 mL   midazolam (VERSED) 5 MG/5ML injection 1-2 mg    Make sure Flumazenil is available in the pyxis when using this medication. If oversedation occurs, administer 0.2 mg IV over 15 sec. If after 45 sec no response, administer 0.2 mg again over 1 min; may repeat at 1 min intervals; not to exceed 4 doses (1 mg)   fentaNYL (SUBLIMAZE) injection 25-50 mcg    Make sure Narcan is available in the pyxis when using this medication. In the event of respiratory depression (RR< 8/min): Titrate NARCAN (naloxone) in increments of 0.1 to 0.2 mg IV at 2-3 minute intervals, until desired degree of reversal.   ropivacaine (PF) 2 mg/mL (0.2%) (NAROPIN) injection 9 mL   methylPREDNISolone acetate (DEPO-MEDROL) injection 80 mg   Medications administered: We administered iohexol, lidocaine, lactated ringers, midazolam, fentaNYL, ropivacaine (PF) 2 mg/mL (0.2%), and methylPREDNISolone acetate.  See the medical record for exact dosing, route, and time of administration.  Follow-up plan:   Return in about 2 weeks (around 12/14/2020) for (F2F), (PP) Follow-up.      Interventional Therapies  Risk   Complexity Considerations:   1. Class III morbid obesity (BMI 42.29 kg/m) 2. Difficulty breathing and tolerating prone position 3. NOTE: PLAVIX Anticoagulation (  Stop: 7-10 days   Restart: 2 hours)   Planned   Pending:   Pending further evaluation   Under consideration:   Pending to order  bilateral shoulder x-rays.  Possible need for neurosurgical evaluation.  We will also consider nerve conduction testing of the lower extremities. Diagnostic left L2-3 LESI #1  Diagnostic left L3-4 LESI #1  Diagnostic left L4-5 LESI #1  Diagnostic bilateral L2 TFESI #1  Diagnostic bilateral L3 TFESI #1  Diagnostic bilateral L4 TFESI #1  Diagnostic bilateral lumbar facet block #1  Diagnostic bilateral femoral nerve and obturator nerve block #1  Diagnostic bilateral IA knee joint injection (steroid) #1  Possible series of bilateral IA Hyalgan knee injections  Diagnostic bilateral genicular nerve block #1    Completed:   Diagnostic left L2-3 LESI x1 (11/16/2020)  Diagnostic/therapeutic left IA hip injection x1 (11/30/2020)   Palliative options:   None established    Recent Visits Date Type Provider Dept  11/29/20 Office Visit Milinda Pointer, MD [facility]  11/16/20 Procedure visit Milinda Pointer, MD [facility]  10/25/20 Office Visit Milinda Pointer, MD [facility]  09/20/20 Office Visit Milinda Pointer, MD [facility]  Showing recent visits within past 90 days and meeting all other requirements Today's Visits Date Type Provider Dept  11/30/20 Procedure visit Milinda Pointer, MD [facility]  Showing today's visits and meeting all other requirements Future Appointments Date Type Provider Dept  12/15/20 Appointment Milinda Pointer, MD [facility]  Showing future appointments within next 90 days and meeting all other requirements  Disposition: Discharge home  Discharge (Date   Time): 11/30/2020; 1020 hrs.   Primary Care Physician: Marguerita Merles, MD Location: [facility] Note by: Gaspar Cola, MD Date: 11/30/2020; Time: 10:33 AM  Disclaimer:  Medicine is not an exact science. The only guarantee in medicine is that nothing is guaranteed. It is important to note  that the decision to proceed with this intervention was based on the information collected from the patient. The Data and conclusions were drawn from the patient's questionnaire, the interview, and the physical examination. Because the information was provided in large part by the patient, it cannot be guaranteed that it has not been purposely or unconsciously manipulated. Every effort has been made to obtain as much relevant data as possible for this evaluation. It is important to note that the conclusions that lead to this procedure are derived in large part from the available data. Always take into account that the treatment will also be dependent on availability of resources and existing treatment guidelines, considered by other Pain Management Practitioners as being common knowledge and practice, at the time of the intervention. For Medico-Legal purposes, it is also important to point out that variation in procedural techniques and pharmacological choices are the acceptable norm. The indications, contraindications, technique, and results of the above procedure should only be interpreted and judged by a Board-Certified Interventional Pain Specialist with extensive familiarity and expertise in the same exact procedure and technique.

## 2020-11-30 NOTE — Progress Notes (Signed)
Safety precautions to be maintained throughout the outpatient stay will include: orient to surroundings, keep bed in low position, maintain call bell within reach at all times, provide assistance with transfer out of bed and ambulation.  

## 2020-11-30 NOTE — Telephone Encounter (Signed)
Called to [facility], returning their call about Calcium ([place]).  This has been discontinued and they were wanting to know what to replace with.  They do have Calcium 600 mg, which is what he received last time.  Okay to use, will let Dr Dossie Arbour know.

## 2020-11-30 NOTE — Patient Instructions (Addendum)
Pain Management Discharge Instructions  General Discharge Instructions :  If you need to reach your doctor call: Monday-Friday 8:00 am - 4:00 pm at [phone] or toll free [phone].  After clinic hours [phone] to have operator reach doctor.  Bring all of your medication bottles to all your appointments in the pain clinic.  To cancel or reschedule your appointment with Pain Management please remember to call 24 hours in advance to avoid a fee.  Refer to the educational materials which you have been given on: General Risks, I had my Procedure. Discharge Instructions, Post Sedation.  Post Procedure Instructions:  The drugs you were given will stay in your system until tomorrow, so for the next 24 hours you should not drive, make any legal decisions or drink any alcoholic beverages.  You may eat anything you prefer, but it is better to start with liquids then soups and crackers, and gradually work up to solid foods.  Please notify your doctor immediately if you have any unusual bleeding, trouble breathing or pain that is not related to your normal pain.  Depending on the type of procedure that was done, some parts of your body may feel week and/or numb.  This usually clears up by tonight or the next day.  Walk with the use of an assistive device or accompanied by an adult for the 24 hours.  You may use ice on the affected area for the first 24 hours.  Put ice in a Ziploc bag and cover with a towel and place against area 15 minutes on 15 minutes off.  You may switch to heat after 24 hours.>>>Go back on PLAVIX no sooner than 3 hours after procedure.<<<   ____________________________________________________________________________________________  Post-Procedure Discharge Instructions  Instructions:  Apply ice:   Purpose: This will minimize any swelling and discomfort after procedure.   When: Day of procedure, as soon as you get home.  How: Fill a plastic sandwich bag  with crushed ice. Cover it with a small towel and apply to injection site.  How long: (15 min on, 15 min off) Apply for 15 minutes then remove x 15 minutes.  Repeat sequence on day of procedure, until you go to bed.  Apply heat:   Purpose: To treat any soreness and discomfort from the procedure.  When: Starting the next day after the procedure.  How: Apply heat to procedure site starting the day following the procedure.  How long: May continue to repeat daily, until discomfort goes away.  Food intake: Start with clear liquids (like water) and advance to regular food, as tolerated.   Physical activities: Keep activities to a minimum for the first 8 hours after the procedure. After that, then as tolerated.  Driving: If you have received any sedation, be responsible and do not drive. You are not allowed to drive for 24 hours after having sedation.  Blood thinner: (Applies only to those taking blood thinners) You may restart your blood thinner 6 hours after your procedure.  Insulin: (Applies only to Diabetic patients taking insulin) As soon as you can eat, you may resume your normal dosing schedule.  Infection prevention: Keep procedure site clean and dry. Shower daily and clean area with soap and water.  Post-procedure Pain Diary: Extremely important that this be done correctly and accurately. Recorded information will be used to determine the next step in treatment. For the purpose of accuracy, follow these rules:  Evaluate only the area treated. Do not report or include pain from an untreated area.  For the purpose of this evaluation, ignore all other areas of pain, except for the treated area.  After your procedure, avoid taking a long nap and attempting to complete the pain diary after you wake up. Instead, set your alarm clock to go off every hour, on the hour, for the initial 8 hours after the procedure. Document the duration of the numbing medicine, and the relief you are getting  from it.  Do not go to sleep and attempt to complete it later. It will not be accurate. If you received sedation, it is likely that you were given a medication that may cause amnesia. Because of this, completing the diary at a later time may cause the information to be inaccurate. This information is needed to plan your care.  Follow-up appointment: Keep your post-procedure follow-up evaluation appointment after the procedure (usually 2 weeks for most procedures, 6 weeks for radiofrequencies). DO NOT FORGET to bring you pain diary with you.   Expect: (What should I expect to see with my procedure?)  From numbing medicine (AKA: Local Anesthetics): Numbness or decrease in pain. You may also experience some weakness, which if present, could last for the duration of the local anesthetic.  Onset: Full effect within 15 minutes of injected.  Duration: It will depend on the type of local anesthetic used. On the average, 1 to 8 hours.   From steroids (Applies only if steroids were used): Decrease in swelling or inflammation. Once inflammation is improved, relief of the pain will follow.  Onset of benefits: Depends on the amount of swelling present. The more swelling, the longer it will take for the benefits to be seen. In some cases, up to 10 days.  Duration: Steroids will stay in the system x 2 weeks. Duration of benefits will depend on multiple posibilities including persistent irritating factors.  Side-effects: If present, they may typically last 2 weeks (the duration of the steroids).  Frequent: Cramps (if they occur, drink Gatorade and take over-the-counter Magnesium 450-500 mg once to twice a day); water retention with temporary weight gain; increases in blood sugar; decreased immune system response; increased appetite.  Occasional: Facial flushing (red, warm cheeks); mood swings; menstrual changes.  Uncommon: Long-term decrease or suppression of natural hormones; bone thinning. (These are  more common with higher doses or more frequent use. This is why we prefer that our patients avoid having any injection therapies in other practices.)   Very Rare: Severe mood changes; psychosis; aseptic necrosis.  From procedure: Some discomfort is to be expected once the numbing medicine wears off. This should be minimal if ice and heat are applied as instructed.  Call if: (When should I call?)  You experience numbness and weakness that gets worse with time, as opposed to wearing off.  New onset bowel or bladder incontinence. (Applies only to procedures done in the spine)  Emergency Numbers:  Durning business hours (Monday - Thursday, 8:00 AM - 4:00 PM) (Friday, 9:00 AM - 12:00 Noon): [phone]  After hours: [phone]  NOTE: If you are having a problem and are unable connect with, or to talk to a provider, then go to your nearest urgent care or emergency department. If the problem is serious and urgent, please call 911. ____________________________________________________________________________________________    ______________________________________________________________________________________________  Body mass index (BMI)  Body mass index (BMI) is a common tool for deciding whether a person has an appropriate body weight.  It measures a persons weight in relation to their height.   According  to the [employer] of health (NIH): Marland Kitchen A BMI of less than 18.5 means that a person is underweight. . A BMI of between 18.5 and 24.9 is ideal. . A BMI of between 25 and 29.9 is overweight. . A BMI over 30 indicates obesity.  Weight Management Required  URGENT: Your weight has been found to be adversely affecting your health.  Dear [name]:  Your current Estimated body mass index is 43.54 kg/m as calculated from the following:   Height as of 11/29/20: _0  (1.702 m).   Weight as of 11/29/20: 278 lb (126.1 kg).  Please use the table below to identify your  weight category and associated incidence of chronic pain, secondary to your weight.  Body Mass Index (BMI) Classification BMI level (kg/m2) Category Associated incidence of chronic pain  <18  Underweight   18.5-24.9 Ideal body weight   25-29.9 Overweight  20%  30-34.9 Obese (Class I)  68%  35-39.9 Severe obesity (Class II)  136%  >40 Extreme obesity (Class III)  254%   In addition: You will be considered "Morbidly Obese", if your BMI is above 30 and you have one or more of the following conditions which are known to be caused and/or directly associated with obesity: 1.    Type 2 Diabetes (Which in turn can lead to cardiovascular diseases (CVD), stroke, peripheral vascular diseases (PVD), retinopathy, nephropathy, and neuropathy) 2.    Cardiovascular Disease (High Blood Pressure; Congestive Heart Failure; High Cholesterol; Coronary Artery Disease; Angina; or History of Heart Attacks) 3.    Breathing problems (Asthma; obesity-hypoventilation syndrome; obstructive sleep apnea; chronic inflammatory airway disease; reactive airway disease; or shortness of breath) 4.    Chronic kidney disease 5.    Liver disease (nonalcoholic fatty liver disease) 6.    High blood pressure 7.    Acid reflux (gastroesophageal reflux disease; heartburn) 8.    Osteoarthritis (OA) (with any of the following: hip pain; knee pain; and/or low back pain) 9.    Low back pain (Lumbar Facet Syndrome; and/or Degenerative Disc Disease) 10.  Hip pain (Osteoarthritis of hip) (For every 1 lbs of added body weight, there is a 2 lbs increase in pressure inside of each hip articulation. 1:2 mechanical relationship) 11.  Knee pain (Osteoarthritis of knee) (For every 1 lbs of added body weight, there is a 4 lbs increase in pressure inside of each knee articulation. 1:4 mechanical relationship) (patients with a BMI>30 kg/m2 were 6.8 times more likely to develop knee OA than normal-weight individuals) 12.  Cancer: Epidemiological studies  have shown that obesity is a risk factor for: post-menopausal breast cancer; cancers of the endometrium, colon and kidney cancer; malignant adenomas of the oesophagus. Obese subjects have an approximately 1.5-3.5-fold increased risk of developing these cancers compared with normal-weight subjects, and it has been estimated that between 15 and 45% of these cancers can be attributed to overweight. More recent studies suggest that obesity may also increase the risk of other types of cancer, including pancreatic, hepatic and gallbladder cancer. (Ref: Obesity and cancer. Pischon T, Nthlings U, [employer] H. Proc Nutr Soc. 2008 May;67(2):128-45. doi: 49.7026/V7858850277412878.) The International Agency for Research on Cancer (IARC) has identified 13 cancers associated with overweight and obesity: meningioma, multiple myeloma, adenocarcinoma of the esophagus, and cancers of the thyroid, postmenopausal breast cancer, gallbladder, stomach, liver, pancreas, kidney, ovaries, uterus, colon and rectal (colorectal) cancers. 2 percent of all cancers diagnosed in women and 24 percent of those diagnosed in men are associated with  overweight and obesity.  Recommendation: At this point it is urgent that you take a step back and concentrate in loosing weight. Dedicate 100% of your efforts on this task. Nothing else will improve your health more than bringing your weight down and your BMI to less than 30. If you are here, you probably have chronic pain. We know that most chronic pain patients have difficulty exercising secondary to their pain. For this reason, you must rely on proper nutrition and diet in order to lose the weight. If your BMI is above 40, you should seriously consider bariatric surgery. A realistic goal is to lose 10% of your body weight over a period of 12 months.  Be honest to yourself, if over time you have unsuccessfully tried to lose weight, then it is time for you to seek professional help and to enter a medically  supervised weight management program, and/or undergo bariatric surgery. Stop procrastinating.   Pain management considerations:  1.    Pharmacological Problems: Be advised that the use of opioid analgesics (oxycodone; hydrocodone; morphine; methadone; codeine; and all of their derivatives) have been associated with decreased metabolism and weight gain.  For this reason, should we see that you are unable to lose weight while taking these medications, it may become necessary for [place] to taper down and indefinitely discontinue them.  2.    Technical Problems: The incidence of successful interventional therapies decreases as the patient's BMI increases. It is much more difficult to accomplish a safe and effective interventional therapy on a patient with a BMI above 35. 3.    Radiation Exposure Problems: The x-rays machine, used to accomplish injection therapies, will automatically increase their x-ray output in order to capture an appropriate bone image. This means that radiation exposure increases exponentially with the patient's BMI. (The higher the BMI, the higher the radiation exposure.) Although the level of radiation used at a given time is still safe to the patient, it is not for the physician and/or assisting staff. Unfortunately, radiation exposure is accumulative. Because physicians and the staff have to do procedures and be exposed on a daily basis, this can result in health problems such as cancer and radiation burns. Radiation exposure to the staff is monitored by the radiation batches that they wear. The exposure levels are reported back to the staff on a quarterly basis. Depending on levels of exposure, physicians and staff may be obligated by law to decrease this exposure. This means that they have the right and obligation to refuse providing therapies where they may be overexposed to radiation. For this reason, physicians may decline to offer therapies such as radiofrequency ablation or implants to  patients with a BMI above 40. 4.    Current Trends: Be advised that the current trend is to no longer offer certain therapies to patients with a BMI equal to, or above 35, due to increase perioperative risks, increased technical procedural difficulties, and excessive radiation exposure to healthcare personnel.  ______________________________________________________________________________________________    ____________________________________________________________________________________________  Virtual Visits   Eligibility In order to be eligible for "Virtual Visit Encounters", you must be available over the phone. It is the patient's responsibility to make sure we have a way to contact you.  We understand how people are reluctant to pickup on "unknown" calls, therefore, we suggest adding our telephone numbers to your list of "CONTACT(s)". This way, you should be able to readily identify our calls when you receive them.  When will this type of visits be used? The  decision will be made on a case by case basis.  At what time will I be called? This is an excellent question. The providers will try to call you during the day, whenever they have spare time. For the purpose of the day's schedule, you are assigned a time for your appointment, however, the call may come anytime during the day. We need you to be available on a moment's notice. If you are unable to do this, then request an "in-person" appointment rather than a "virtual visit".  Can I request my medication visits to be "Virtual"? Yes you may request it, but the decision is entirely up to the healthcare provider. Control substances require specific monitoring that requires Face-to-Face encounters. The number of encounters  and the extent of the monitoring is determined on a case by case basis.  Add a new contact to your smart phone and label it "PAIN CLINIC" Under this contact add the following numbers: Main: [phone]  (Official Contact Number) Nurses: [phone] (These are outgoing only calling systems. Do not call this number.) Dr. Dossie Arbour: [phone] or [phone] (Outgoing calls only. Do not call this number.)  ____________________________________________________________________________________________   ____________________________________________________________________________________________  Blood Thinners  IMPORTANT NOTICE:  If you take any of these, make sure to notify the nursing staff.  Failure to do so may result in injury.  Recommended time intervals to stop and restart blood-thinners, before & after invasive procedures  Generic Name Brand Name Stop Time. Must be stopped at least this long before procedures. After procedures, wait at least this long before re-starting.  Abciximab Reopro 15 days 2 hrs  Alteplase Activase 10 days 10 days  Anagrelide Agrylin    Apixaban Eliquis 3 days 6 hrs  Cilostazol Pletal 3 days 5 hrs  Clopidogrel Plavix 7-10 days 2 hrs  Dabigatran Pradaxa 5 days 6 hrs  Dalteparin Fragmin 24 hours 4 hrs  Dipyridamole Aggrenox 11days 2 hrs  Edoxaban Lixiana; Savaysa 3 days 2 hrs  Enoxaparin  Lovenox 24 hours 4 hrs  Eptifibatide Integrillin 8 hours 2 hrs  Fondaparinux  Arixtra 72 hours 12 hrs  Prasugrel Effient 7-10 days 6 hrs  Reteplase Retavase 10 days 10 days  Rivaroxaban Xarelto 3 days 6 hrs  Ticagrelor Brilinta 5-7 days 6 hrs  Ticlopidine Ticlid 10-14 days 2 hrs  Tinzaparin Innohep 24 hours 4 hrs  Tirofiban Aggrastat 8 hours 2 hrs  Warfarin Coumadin 5 days 2 hrs   Other medications with blood-thinning effects  Product indications Generic (Brand) names Note  Cholesterol Lipitor Stop 4 days before procedure  Blood thinner (injectable) Heparin (LMW or LMWH Heparin) Stop 24 hours before procedure  Cancer Ibrutinib (Imbruvica) Stop 7 days before procedure  Malaria/Rheumatoid Hydroxychloroquine (Plaquenil) Stop 11 days before procedure   Thrombolytics  10 days before or after procedures   Over-the-counter (OTC) Products with blood-thinning effects  Product Common names Stop Time  Aspirin > 325 mg Goody Powders, Excedrin, etc. 11 days  Aspirin ? 81 mg  7 days  Fish oil  4 days  Garlic supplements  7 days  Ginkgo biloba  36 hours  Ginseng  24 hours  NSAIDs Ibuprofen, Naprosyn, etc. 3 days  Vitamin E  4 days   ____________________________________________________________________________________________

## 2020-12-01 ENCOUNTER — Telehealth: Payer: Self-pay

## 2020-12-01 NOTE — Telephone Encounter (Signed)
Post procedure phone call.  LM 

## 2020-12-02 ENCOUNTER — Ambulatory Visit: Payer: Medicare HMO | Admitting: Gastroenterology

## 2020-12-08 ENCOUNTER — Other Ambulatory Visit: Payer: Self-pay | Admitting: Cardiovascular Disease

## 2020-12-08 ENCOUNTER — Other Ambulatory Visit: Payer: Self-pay | Admitting: Pain Medicine

## 2020-12-08 ENCOUNTER — Ambulatory Visit: Payer: Medicare HMO | Admitting: Nurse Practitioner

## 2020-12-08 ENCOUNTER — Other Ambulatory Visit: Payer: Self-pay | Admitting: Family

## 2020-12-08 DIAGNOSIS — G894 Chronic pain syndrome: Secondary | ICD-10-CM

## 2020-12-14 NOTE — Progress Notes (Signed)
PROVIDER NOTE: Information contained herein reflects review and annotations entered in association with encounter. Interpretation of such information and data should be left to medically-trained personnel. Information provided to patient can be located elsewhere in the medical record under "Patient Instructions". Document created using STT-dictation technology, any transcriptional errors that may result from process are unintentional.    Patient: [name]  Service Category: E/M  Provider: Gaspar Cola, MD  DOB: March 04, 1935  DOS: 12/15/2020  Specialty: Interventional Pain Management  MRN: [id number]  Setting: Ambulatory outpatient  PCP: Marguerita Merles, MD  Type: Established Patient    Referring Provider: Marguerita Merles, MD  Location: Office  Delivery: Face-to-face     HPI  [name], a 85 y.o. year old male, is here today because of his Chronic pain syndrome [G89.4]. Mr. Bramel primary complain today is Back Pain and Knee Pain (left) Last encounter: My last encounter with him was on 12/08/2020. Pertinent problems: Mr. Francesconi has Meningioma [facility]); Lower extremity pain, bilateral; Chronic pain syndrome; Chronic hip pain (1ry area of Pain) (Left); Unilateral post-traumatic osteoarthritis of hip (Left); Chronic knee pain (2ry area of Pain) (Bilateral); Secondary osteoarthritis of knee (Bilateral); Osteoarthritis of knees (Bilateral); Chronic shoulder pain (3ry area of Pain) (Bilateral); Grade 1 (3 mm) anterolisthesis of lumbar spine (L4-5) (stable); Chronic groin pain (Left); Other intervertebral disc degeneration, lumbar region; Neurogenic pain;  Klippel-Feil deformity at the C5-C7 levels; Lumbosacral spondylosis; Osteoarthritis of facet joint of lumbar spine; DDD (degenerative disc disease), lumbosacral; History of total hip replacement (Right); Osteoarthritis of hip (Left); Abnormal MRI, lumbar spine (10/07/2020); Lumbar central spinal stenosis with neurogenic claudication; Lumbar foraminal  stenosis; and Polyarthritis with positive rheumatoid factor (HCC) on their pertinent problem list. Pain Assessment: Severity of Chronic pain is reported as a 10-Worst pain ever/10. Location: Back Lower/radiates into left leg and knee in the side. Onset: More than a month ago. Quality: Sharp,Stabbing,Discomfort. Timing: Constant. Modifying factor(s): injections. Vitals:  height is _0  (1.702 m) and weight is 270 lb (122.5 kg). His temperature is 97 F (36.1 C) (abnormal). His blood pressure is 116/54 (abnormal) and his pulse is 104 (abnormal). His respiration is 18 and oxygen saturation is 96%.   Reason for encounter: post-procedure assessment.  The patient refers having attained 100% relief of the pain from the diagnostic left intra-articular hip joint injection and bursa injection.  The pain did not return until today and even today it is still much better than when he first came in.  He also refers that his left knee pain, which had been previously treated by [facility] with "gel" injections, has also returned.  He would like to see if we can do both injections for him.  We will go ahead and schedule him to have that done.  Today I also took the time to explain to the patient the relationship between his weight and the issues associated with the osteoarthritis affecting the knees and hip.  He was instructed to work on a diet plan to decrease his weight as we do realize that with chronic pain it is very difficult to lose the weight by exercising.  I also reminded him to be realistic and if he does not see any benefit within a 28-month,  Then he needs to get some help from a medical weight loss program and if that fails then consider bariatric surgery.  Today we also spoke about the gabapentin and apparently it did not read the instructions correctly and therefore he  is still taking 300 mg p.o. twice daily.  Today I have reminded them to continue increasing that by 1 additional capsule every 7 days until  he gets to 300 mg 4 times daily.  Nonopioids transfer 11/29/2020: Magnesium, calcium, vitamin D3, and gabapentin.  However, the patient was still being titrated up, by 11/29/2020.  Post-Procedure Evaluation  Procedure (12/08/2020): Diagnostic left IA hip injection #1 + diagnostic left subgluteal maximus bursa injection #1 under fluoroscopic guidance and IV sedation Pre-procedure pain level: 10/10 Post-procedure: 8/10 (< 50% relief)  Sedation: Sedation provided.  Effectiveness during initial hour after procedure(Ultra-Short Term Relief): 100 %.  Local anesthetic used: Long-acting (4-6 hours) Effectiveness: Defined as any analgesic benefit obtained secondary to the administration of local anesthetics. This carries significant diagnostic value as to the etiological location, or anatomical origin, of the pain. Duration of benefit is expected to coincide with the duration of the local anesthetic used.  Effectiveness during initial 4-6 hours after procedure(Short-Term Relief): 100 %.  Long-term benefit: Defined as any relief past the pharmacologic duration of the local anesthetics.  Effectiveness past the initial 6 hours after procedure(Long-Term Relief): 100 % (pain came back today).  Current benefits: Defined as benefit that persist at this time.   Analgesia:  >75% relief Function: Mr. Swindler reports improvement in function ROM: Mr. Junkin reports improvement in ROM  Pharmacotherapy Assessment   Analgesic: Tramadol 50 mg, 1 tab p.o. 4 times daily (200 mg/day of tramadol) (20 MME/day) MME/day: 20 mg/day   Monitoring: [place] PMP: PDMP reviewed during this encounter.       Pharmacotherapy: No side-effects or adverse reactions reported. Compliance: No problems identified. Effectiveness: Clinically acceptable.  Ignatius Specking, RN  12/15/2020  1:31 PM  Sign when Signing Visit Nursing Pain Medication Assessment:  Safety precautions to be maintained throughout the outpatient stay will include:  orient to surroundings, keep bed in low position, maintain call bell within reach at all times, provide assistance with transfer out of bed and ambulation.  Medication Inspection Compliance: Pill count conducted under aseptic conditions, in front of the patient. Neither the pills nor the bottle was removed from the patient's sight at any time. Once count was completed pills were immediately returned to the patient in their original bottle.  Medication: oxycodone hcl 57m Pill/Patch Count: 0 of 120 pills remain Pill/Patch Appearance: Markings consistent with prescribed medication Bottle Appearance: Standard pharmacy container. Clearly labeled. Filled Date: 179/ 8 / 2022 Last Medication intake:  approximately 2 weeks ago.  TDewayne Shorter RN  12/15/2020 12:53 PM  Signed Safety precautions to be maintained throughout the outpatient stay will include: orient to surroundings, keep bed in low position, maintain call bell within reach at all times, provide assistance with transfer out of bed and ambulation.    UDS:  Summary  Date Value Ref Range Status  09/20/2020 Note  Final    Comment:    ==================================================================== Compliance Drug Analysis, Ur ==================================================================== Test                             Result       Flag       Units  Drug Present and Declared for Prescription Verification   Tramadol                       >4274        EXPECTED   ng/mg creat   O-Desmethyltramadol            >  4274        EXPECTED   ng/mg creat   N-Desmethyltramadol            843          EXPECTED   ng/mg creat    Source of tramadol is a prescription medication. O-desmethyltramadol    and N-desmethyltramadol are expected metabolites of tramadol.    Quetiapine                     PRESENT      EXPECTED   Salicylate                     PRESENT      EXPECTED  Drug Present not Declared for Prescription Verification   Naproxen                        PRESENT      UNEXPECTED  Drug Absent but Declared for Prescription Verification   Tizanidine                     Not Detected UNEXPECTED    Tizanidine, as indicated in the declared medication list, is not    always detected even when used as directed.  ==================================================================== Test                      Result    Flag   Units      Ref Range   Creatinine              117              mg/dL      >=20 ==================================================================== Declared Medications:  The flagging and interpretation on this report are based on the  following declared medications.  Unexpected results may arise from  inaccuracies in the declared medications.   **Note: The testing scope of this panel includes these medications:   Quetiapine (Seroquel)  Tramadol (Ultram)   **Note: The testing scope of this panel does not include small to  moderate amounts of these reported medications:   Aspirin  Tizanidine (Zanaflex)   **Note: The testing scope of this panel does not include the  following reported medications:   Albuterol  Amlodipine  Atorvastatin  Carvedilol  Clopidogrel (Plavix)  Fluticasone (Flonase)  Fluticasone (Trelegy)  Furosemide (Lasix)  Ipratropium (Atrovent)  Isosorbide (Imdur)  Nitroglycerin (Nitrostat)  Umeclidinium (Trelegy)  Vilanterol (Trelegy) ==================================================================== For clinical consultation, please call [phone]. ====================================================================      ROS  Constitutional: Denies any fever or chills Gastrointestinal: No reported hemesis, hematochezia, vomiting, or acute GI distress Musculoskeletal: Denies any acute onset joint swelling, redness, loss of ROM, or weakness Neurological: No reported episodes of acute onset apraxia, aphasia, dysarthria, agnosia, amnesia, paralysis, loss of coordination, or  loss of consciousness  Medication Review  Fluticasone-Umeclidin-Vilant, Magnesium Oxide, QUEtiapine, Vitamin D3, albuterol, amLODipine, aspirin, atorvastatin, calcium carbonate, carvedilol, clopidogrel, fluticasone, furosemide, gabapentin, ipratropium, isosorbide mononitrate, nitroGLYCERIN, and tiZANidine  History Review  Allergy: Mr. Clute is allergic to chlorthalidone, ace inhibitors, hydrochlorothiazide, lisinopril, losartan, other, and benazepril-hydrochlorothiazide. Drug: Mr. Cwynar  reports no history of drug use. Alcohol:  reports previous alcohol use. Tobacco:  reports that he has quit smoking. His smoking use included cigarettes. He has a 50.00 pack-year smoking history. He has never used smokeless tobacco. Social: Mr. Reh  reports that he has quit smoking. His smoking use included cigarettes. He has a  50.00 pack-year smoking history. He has never used smokeless tobacco. He reports previous alcohol use. He reports that he does not use drugs. Medical:  has a past medical history of Alcoholism ([place]), Asthma, Bipolar affective disorder ([place]), Chronic combined systolic and diastolic CHF (congestive heart failure) ([place]), CKD (chronic kidney disease), stage III ([place]), COPD (chronic obstructive pulmonary disease) ([place]), Coronary artery disease, Degenerative joint disease, Dyspnea, Essential hypertension, Hyperlipidemia, Hypertension, Ischemic cardiomyopathy, OSA (obstructive sleep apnea), Prostate CA ([place]), PVD (peripheral vascular disease) ([place]), and Tobacco abuse. Surgical: Mr. Esquivel  has a past surgical history that includes Total hip arthroplasty; Coronary/Graft Acute MI Revascularization (N/A, 04/01/2019); CORONARY ANGIOGRAPHY (N/A, 04/01/2019); Cardiac catheterization; and Coronary angioplasty with stent. Family: family history includes Heart attack in his father and mother; Hyperlipidemia in his father and mother; Hypertension in his father and mother.  Laboratory Chemistry Profile   Renal Lab  Results  Component Value Date   BUN 32 (H) 09/20/2020   CREATININE 2.28 (H) 09/20/2020   LABCREA 124 05/29/2017   BCR 14 09/20/2020   GFRAA 30 (L) 09/20/2020   GFRNONAA 26 (L) 09/20/2020     Hepatic Lab Results  Component Value Date   AST 14 09/20/2020   ALT 11 12/23/2019   ALBUMIN 3.5 (L) 09/20/2020   ALKPHOS 163 (H) 09/20/2020   HCVAB NEGATIVE 10/31/2010   AMYLASE 56 11/01/2010   LIPASE 48 07/31/2017     Electrolytes Lab Results  Component Value Date   NA 132 (L) 09/20/2020   K 3.9 09/20/2020   CL 94 (L) 09/20/2020   CALCIUM 8.3 (L) 09/20/2020   MG 2.0 09/20/2020     Bone Lab Results  Component Value Date   25OHVITD1 16 (L) 09/20/2020   25OHVITD2 <1.0 09/20/2020   25OHVITD3 15 09/20/2020     Inflammation (CRP: Acute Phase) (ESR: Chronic Phase) Lab Results  Component Value Date   CRP 39 (H) 09/20/2020   ESRSEDRATE >140 (H) 09/20/2020   LATICACIDVEN 1.0 10/30/2010       Note: Above Lab results reviewed.  Recent Imaging Review  DG PAIN CLINIC C-ARM 1-60 MIN NO REPORT Fluoro was used, but no Radiologist interpretation will be provided.  Please refer to "NOTES" tab for provider progress note. Note: Reviewed        Physical Exam  General appearance: Well nourished, well developed, and well hydrated. In no apparent acute distress Mental status: Alert, oriented x 3 (person, place, & time)       Respiratory: No evidence of acute respiratory distress Eyes: PERLA Vitals: BP (!) 116/54   Pulse (!) 104   Temp (!) 97 F (36.1 C)   Resp 18   Ht _0  (1.702 m)   Wt 270 lb (122.5 kg)   SpO2 96%   BMI 42.29 kg/m  BMI: Estimated body mass index is 42.29 kg/m as calculated from the following:   Height as of this encounter: _1  (1.702 m).   Weight as of this encounter: 270 lb (122.5 kg). Ideal: Ideal body weight: 66.1 kg (145 lb 11.6 oz) Adjusted ideal body weight: 88.6 kg (195 lb 6.9 oz)  Assessment   Status Diagnosis  Controlled Controlled Controlled  1. Chronic pain syndrome   2. Chronic hip pain (1ry area of Pain) (Left)   3. Chronic groin pain (Left)   4. Polyarthritis with positive rheumatoid factor (HCC)   5. Chronic knee pain (2ry area of Pain) (Bilateral)   6. Chronic shoulder pain (3ry area of Pain) (Bilateral)  7. Neurogenic pain   8. Chronic anticoagulation (Plavix)      Updated Problems: Problem  History of Alcohol Abuse   Documented on 10/30/2016.     Plan of Care  Problem-specific:  No problem-specific Assessment & Plan notes found for this encounter.  Mr. CULLEY HEDEEN has a current medication list which includes the following long-term medication(s): albuterol, amlodipine, atorvastatin, calcium carbonate, carvedilol, vitamin d3, furosemide, gabapentin, ipratropium, isosorbide mononitrate, magnesium oxide, nitroglycerin, and quetiapine.  Pharmacotherapy (Medications Ordered): No orders of the defined types were placed in this encounter.  Orders:  Orders Placed This Encounter  Procedures  . HIP INJECTION    Standing Status:   Future    Standing Expiration Date:   03/15/2021    Scheduling Instructions:     Side: Left-sided     Sedation: With Sedation.     Timeframe: As soon as schedule allows  . KNEE INJECTION    Hyalgan knee injection. Please order Hyalgan.    Standing Status:   Future    Standing Expiration Date:   01/15/2021    Scheduling Instructions:     Procedure: Intra-articular Hyalgan Knee injection #1     Side: Left-sided     Sedation: None     Timeframe: ASAA    Order Specific Question:   Where will this procedure be performed?    Answer:   [facility]  . Blood Thinner Instructions to Nursing    Always make sure patient has clearance from prescribing physician to stop blood thinners for interventional therapies. If the patient requires a Lovenox-bridge therapy, make sure arrangements are made to institute it with the assistance of the PCP.    Scheduling Instructions:     Have Mr.  Giannotti stop the Plavix (Clopidogrel) x 7-10 days prior to procedure or surgery.   Follow-up plan:   Return for Procedure (w/ sedation): (L) IA Hip inj. #2 + (L) Hyalgan Knee inj. #1, (Blood Thinner Protocol).      Interventional Therapies  Risk  Complexity Considerations:   Class III morbid obesity (BMI 42.29 kg/m) Difficulty breathing and tolerating prone position NOTE: PLAVIX Anticoagulation (Stop: 7-10 days  Restart: 2 hours) Chronic CHF  CAD with history of MI  History of alcohol abuse  Chronic kidney disease stage III  COPD  OSA  Carotid artery disease   Planned  Pending:   Pending further evaluation   Under consideration:   Pending to order bilateral shoulder x-rays.  Possible need for neurosurgical evaluation.  We will also consider nerve conduction testing of the lower extremities. Diagnostic left L2-3 LESI #1  Diagnostic left L3-4 LESI #1  Diagnostic left L4-5 LESI #1  Diagnostic bilateral L2 TFESI #1  Diagnostic bilateral L3 TFESI #1  Diagnostic bilateral L4 TFESI #1  Diagnostic bilateral lumbar facet block #1  Diagnostic bilateral femoral nerve and obturator nerve block #1  Diagnostic bilateral IA knee joint injection (steroid) #1  Possible series of bilateral IA Hyalgan knee injections  Diagnostic bilateral genicular nerve block #1    Completed:   Diagnostic left L2-3 LESI x1 (11/16/2020) (100/100/100 x2 days/<50) Diagnostic/therapeutic left IA hip injection x1 (11/30/2020)  Diagnostic/therapeutic left subgluteus maximus bursa injection x1 (11/30/2020)    Palliative options:   Therapeutic left L2-3 LESI #2     Recent Visits Date Type Provider Dept  11/30/20 Procedure visit Milinda Pointer, MD [facility]  11/29/20 Office Visit Milinda Pointer, MD [facility]  11/16/20 Procedure visit Milinda Pointer, MD [facility]  10/25/20 Office Visit Milinda Pointer, MD [facility]  09/20/20 Office Visit Milinda Pointer, MD [facility]  Showing recent visits within past 90 days and meeting all other requirements Today's Visits Date Type Provider Dept  12/15/20 Office Visit Milinda Pointer, MD [facility]  Showing today's visits and meeting all other requirements Future Appointments Date Type Provider Dept  12/23/20 Appointment Milinda Pointer, MD [facility]  Showing future appointments within next 90 days and meeting all other requirements  I discussed the assessment and treatment plan with the patient. The patient was provided an opportunity to ask questions and all were answered. The patient agreed with the plan and demonstrated an understanding of the instructions.  Patient advised to call back or seek an in-person evaluation if the symptoms or condition worsens.  Duration of encounter: 39 minutes.  Note by: Gaspar Cola, MD Date: 12/15/2020; Time: 2:00 PM

## 2020-12-15 ENCOUNTER — Ambulatory Visit: Payer: Medicare HMO | Admitting: Pain Medicine

## 2020-12-15 ENCOUNTER — Encounter: Payer: Self-pay | Admitting: Pain Medicine

## 2020-12-15 ENCOUNTER — Other Ambulatory Visit: Payer: Self-pay

## 2020-12-15 ENCOUNTER — Ambulatory Visit: Payer: Medicare HMO | Attending: Pain Medicine | Admitting: Pain Medicine

## 2020-12-15 VITALS — BP 116/54 | HR 104 | Temp 97.0°F | Resp 18 | Ht 67.0 in | Wt 270.0 lb

## 2020-12-15 DIAGNOSIS — M25511 Pain in right shoulder: Secondary | ICD-10-CM | POA: Insufficient documentation

## 2020-12-15 DIAGNOSIS — M25561 Pain in right knee: Secondary | ICD-10-CM | POA: Diagnosis present

## 2020-12-15 DIAGNOSIS — M25552 Pain in left hip: Secondary | ICD-10-CM | POA: Insufficient documentation

## 2020-12-15 DIAGNOSIS — M25512 Pain in left shoulder: Secondary | ICD-10-CM | POA: Insufficient documentation

## 2020-12-15 DIAGNOSIS — M25562 Pain in left knee: Secondary | ICD-10-CM | POA: Diagnosis present

## 2020-12-15 DIAGNOSIS — G894 Chronic pain syndrome: Secondary | ICD-10-CM | POA: Diagnosis present

## 2020-12-15 DIAGNOSIS — M792 Neuralgia and neuritis, unspecified: Secondary | ICD-10-CM | POA: Insufficient documentation

## 2020-12-15 DIAGNOSIS — G8929 Other chronic pain: Secondary | ICD-10-CM | POA: Diagnosis present

## 2020-12-15 DIAGNOSIS — R1032 Left lower quadrant pain: Secondary | ICD-10-CM | POA: Diagnosis not present

## 2020-12-15 DIAGNOSIS — M0580 Other rheumatoid arthritis with rheumatoid factor of unspecified site: Secondary | ICD-10-CM | POA: Insufficient documentation

## 2020-12-15 DIAGNOSIS — Z7901 Long term (current) use of anticoagulants: Secondary | ICD-10-CM | POA: Diagnosis present

## 2020-12-15 NOTE — Patient Instructions (Addendum)
______________________________________________________________________________________________  Body mass index (BMI)  Body mass index (BMI) is a common tool for deciding whether a person has an appropriate body weight.  It measures a persons weight in relation to their height.   According to the [employer] of health (NIH): Marland Kitchen A BMI of less than 18.5 means that a person is underweight. . A BMI of between 18.5 and 24.9 is ideal. . A BMI of between 25 and 29.9 is overweight. . A BMI over 30 indicates obesity.  Weight Management Required  URGENT: Your weight has been found to be adversely affecting your health.  Dear [name]:  Your current Estimated body mass index is 42.29 kg/m as calculated from the following:   Height as of 11/30/20: _0  (1.702 m).   Weight as of 11/30/20: 270 lb (122.5 kg).  Please use the table below to identify your weight category and associated incidence of chronic pain, secondary to your weight.  Body Mass Index (BMI) Classification BMI level (kg/m2) Category Associated incidence of chronic pain  <18  Underweight   18.5-24.9 Ideal body weight   25-29.9 Overweight  20%  30-34.9 Obese (Class I)  68%  35-39.9 Severe obesity (Class II)  136%  >40 Extreme obesity (Class III)  254%   In addition: You will be considered "Morbidly Obese", if your BMI is above 30 and you have one or more of the following conditions which are known to be caused and/or directly associated with obesity: 1.    Type 2 Diabetes (Which in turn can lead to cardiovascular diseases (CVD), stroke, peripheral vascular diseases (PVD), retinopathy, nephropathy, and neuropathy) 2.    Cardiovascular Disease (High Blood Pressure; Congestive Heart Failure; High Cholesterol; Coronary Artery Disease; Angina; or History of Heart Attacks) 3.    Breathing problems (Asthma; obesity-hypoventilation syndrome; obstructive sleep apnea; chronic inflammatory airway disease; reactive airway disease; or  shortness of breath) 4.    Chronic kidney disease 5.    Liver disease (nonalcoholic fatty liver disease) 6.    High blood pressure 7.    Acid reflux (gastroesophageal reflux disease; heartburn) 8.    Osteoarthritis (OA) (with any of the following: hip pain; knee pain; and/or low back pain) 9.    Low back pain (Lumbar Facet Syndrome; and/or Degenerative Disc Disease) 10.  Hip pain (Osteoarthritis of hip) (For every 1 lbs of added body weight, there is a 2 lbs increase in pressure inside of each hip articulation. 1:2 mechanical relationship) 11.  Knee pain (Osteoarthritis of knee) (For every 1 lbs of added body weight, there is a 4 lbs increase in pressure inside of each knee articulation. 1:4 mechanical relationship) (patients with a BMI>30 kg/m2 were 6.8 times more likely to develop knee OA than normal-weight individuals) 12.  Cancer: Epidemiological studies have shown that obesity is a risk factor for: post-menopausal breast cancer; cancers of the endometrium, colon and kidney cancer; malignant adenomas of the oesophagus. Obese subjects have an approximately 1.5-3.5-fold increased risk of developing these cancers compared with normal-weight subjects, and it has been estimated that between 15 and 45% of these cancers can be attributed to overweight. More recent studies suggest that obesity may also increase the risk of other types of cancer, including pancreatic, hepatic and gallbladder cancer. (Ref: Obesity and cancer. Pischon T, Nthlings U, [employer] H. Proc Nutr Soc. 2008 May;67(2):128-45. doi: 16.1096/E4540981191478295.) The International Agency for Research on Cancer (IARC) has identified 13 cancers associated with overweight and obesity: meningioma, multiple myeloma, adenocarcinoma of the esophagus, and  cancers of the thyroid, postmenopausal breast cancer, gallbladder, stomach, liver, pancreas, kidney, ovaries, uterus, colon and rectal (colorectal) cancers. 94 percent of all cancers diagnosed in women  and 24 percent of those diagnosed in men are associated with overweight and obesity.  Recommendation: At this point it is urgent that you take a step back and concentrate in loosing weight. Dedicate 100% of your efforts on this task. Nothing else will improve your health more than bringing your weight down and your BMI to less than 30. If you are here, you probably have chronic pain. We know that most chronic pain patients have difficulty exercising secondary to their pain. For this reason, you must rely on proper nutrition and diet in order to lose the weight. If your BMI is above 40, you should seriously consider bariatric surgery. A realistic goal is to lose 10% of your body weight over a period of 12 months.  Be honest to yourself, if over time you have unsuccessfully tried to lose weight, then it is time for you to seek professional help and to enter a medically supervised weight management program, and/or undergo bariatric surgery. Stop procrastinating.   Pain management considerations:  1.    Pharmacological Problems: Be advised that the use of opioid analgesics (oxycodone; hydrocodone; morphine; methadone; codeine; and all of their derivatives) have been associated with decreased metabolism and weight gain.  For this reason, should we see that you are unable to lose weight while taking these medications, it may become necessary for [place] to taper down and indefinitely discontinue them.  2.    Technical Problems: The incidence of successful interventional therapies decreases as the patient's BMI increases. It is much more difficult to accomplish a safe and effective interventional therapy on a patient with a BMI above 35. 3.    Radiation Exposure Problems: The x-rays machine, used to accomplish injection therapies, will automatically increase their x-ray output in order to capture an appropriate bone image. This means that radiation exposure increases exponentially with the patient's BMI. (The higher the  BMI, the higher the radiation exposure.) Although the level of radiation used at a given time is still safe to the patient, it is not for the physician and/or assisting staff. Unfortunately, radiation exposure is accumulative. Because physicians and the staff have to do procedures and be exposed on a daily basis, this can result in health problems such as cancer and radiation burns. Radiation exposure to the staff is monitored by the radiation batches that they wear. The exposure levels are reported back to the staff on a quarterly basis. Depending on levels of exposure, physicians and staff may be obligated by law to decrease this exposure. This means that they have the right and obligation to refuse providing therapies where they may be overexposed to radiation. For this reason, physicians may decline to offer therapies such as radiofrequency ablation or implants to patients with a BMI above 40. 4.    Current Trends: Be advised that the current trend is to no longer offer certain therapies to patients with a BMI equal to, or above 35, due to increase perioperative risks, increased technical procedural difficulties, and excessive radiation exposure to healthcare personnel.  ______________________________________________________________________________________________    ____________________________________________________________________________________________  Preparing for Procedure with Sedation  Procedure appointments are limited to planned procedures: . No Prescription Refills. . No disability issues will be discussed. . No medication changes will be discussed.  Instructions: . Oral Intake: Do not eat or drink anything for at least 8 hours  prior to your procedure. (Exception: Blood Pressure Medication. See below.) . Transportation: Unless otherwise stated by your physician, you may drive yourself after the procedure. . Blood Pressure Medicine: Do not forget to take your blood pressure  medicine with a sip of water the morning of the procedure. If your Diastolic (lower reading)is above 100 mmHg, elective cases will be cancelled/rescheduled. . Blood thinners: These will need to be stopped for procedures. Notify our staff if you are taking any blood thinners. Depending on which one you take, there will be specific instructions on how and when to stop it. . Diabetics on insulin: Notify the staff so that you can be scheduled 1st case in the morning. If your diabetes requires high dose insulin, take only  of your normal insulin dose the morning of the procedure and notify the staff that you have done so. . Preventing infections: Shower with an antibacterial soap the morning of your procedure. . Build-up your immune system: Take 1000 mg of Vitamin C with every meal (3 times a day) the day prior to your procedure. Marland Kitchen Antibiotics: Inform the staff if you have a condition or reason that requires you to take antibiotics before dental procedures. . Pregnancy: If you are pregnant, call and cancel the procedure. . Sickness: If you have a cold, fever, or any active infections, call and cancel the procedure. . Arrival: You must be in the facility at least 30 minutes prior to your scheduled procedure. . Children: Do not bring children with you. . Dress appropriately: Bring dark clothing that you would not mind if they get stained. . Valuables: Do not bring any jewelry or valuables.  Reasons to call and reschedule or cancel your procedure: (Following these recommendations will minimize the risk of a serious complication.) . Surgeries: Avoid having procedures within 2 weeks of any surgery. (Avoid for 2 weeks before or after any surgery). . Flu Shots: Avoid having procedures within 2 weeks of a flu shots or . (Avoid for 2 weeks before or after immunizations). . Barium: Avoid having a procedure within 7-10 days after having had a radiological study involving the use of radiological contrast.  (Myelograms, Barium swallow or enema study). . Heart attacks: Avoid any elective procedures or surgeries for the initial 6 months after a "Myocardial Infarction" (Heart Attack). . Blood thinners: It is imperative that you stop these medications before procedures. Let us know if you if you take any blood thinner.  . Infection: Avoid procedures during or within two weeks of an infection (including chest colds or gastrointestinal problems). Symptoms associated with infections include: Localized redness, fever, chills, night sweats or profuse sweating, burning sensation when voiding, cough, congestion, stuffiness, runny nose, sore throat, diarrhea, nausea, vomiting, cold or Flu symptoms, recent or current infections. It is specially important if the infection is over the area that we intend to treat. Marland Kitchen Heart and lung problems: Symptoms that may suggest an active cardiopulmonary problem include: cough, chest pain, breathing difficulties or shortness of breath, dizziness, ankle swelling, uncontrolled high or unusually low blood pressure, and/or palpitations. If you are experiencing any of these symptoms, cancel your procedure and contact your primary care physician for an evaluation.  Remember:  Regular Business hours are:  Monday to Thursday 8:00 AM to 4:00 PM  Provider's Schedule: Milinda Pointer, MD:  Procedure days: Tuesday and Thursday 7:30 AM to 4:00 PM  Gillis Santa, MD:  Procedure days: Monday and Wednesday 7:30 AM to 4:00 PM ____________________________________________________________________________________________

## 2020-12-15 NOTE — Progress Notes (Signed)
Nursing Pain Medication Assessment:  Safety precautions to be maintained throughout the outpatient stay will include: orient to surroundings, keep bed in low position, maintain call bell within reach at all times, provide assistance with transfer out of bed and ambulation.  Medication Inspection Compliance: Pill count conducted under aseptic conditions, in front of the patient. Neither the pills nor the bottle was removed from the patient's sight at any time. Once count was completed pills were immediately returned to the patient in their original bottle.  Medication: oxycodone hcl 5mg  Pill/Patch Count: 0 of 120 pills remain Pill/Patch Appearance: Markings consistent with prescribed medication Bottle Appearance: Standard pharmacy container. Clearly labeled. Filled Date: 32 / 8 / 2022 Last Medication intake:  approximately 2 weeks ago.

## 2020-12-15 NOTE — Progress Notes (Signed)
Safety precautions to be maintained throughout the outpatient stay will include: orient to surroundings, keep bed in low position, maintain call bell within reach at all times, provide assistance with transfer out of bed and ambulation.  

## 2020-12-21 ENCOUNTER — Other Ambulatory Visit: Payer: Self-pay | Admitting: Cardiovascular Disease

## 2020-12-23 ENCOUNTER — Encounter: Payer: Self-pay | Admitting: Pain Medicine

## 2020-12-23 ENCOUNTER — Other Ambulatory Visit: Payer: Self-pay

## 2020-12-23 ENCOUNTER — Ambulatory Visit
Admission: RE | Admit: 2020-12-23 | Discharge: 2020-12-23 | Disposition: A | Discharge status: Home / Self Care | Payer: HMO | Source: Ambulatory Visit | Attending: Pain Medicine | Admitting: Pain Medicine

## 2020-12-23 ENCOUNTER — Ambulatory Visit (HOSPITAL_BASED_OUTPATIENT_CLINIC_OR_DEPARTMENT_OTHER): Payer: HMO | Admitting: Pain Medicine

## 2020-12-23 VITALS — BP 132/80 | HR 86 | Temp 97.4°F | Resp 20 | Ht 67.0 in | Wt 270.0 lb

## 2020-12-23 DIAGNOSIS — M25552 Pain in left hip: Secondary | ICD-10-CM | POA: Diagnosis present

## 2020-12-23 DIAGNOSIS — M25452 Effusion, left hip: Secondary | ICD-10-CM

## 2020-12-23 DIAGNOSIS — G8929 Other chronic pain: Secondary | ICD-10-CM | POA: Insufficient documentation

## 2020-12-23 DIAGNOSIS — M25561 Pain in right knee: Secondary | ICD-10-CM | POA: Insufficient documentation

## 2020-12-23 DIAGNOSIS — Z7901 Long term (current) use of anticoagulants: Secondary | ICD-10-CM | POA: Diagnosis present

## 2020-12-23 DIAGNOSIS — M174 Other bilateral secondary osteoarthritis of knee: Secondary | ICD-10-CM

## 2020-12-23 DIAGNOSIS — M1652 Unilateral post-traumatic osteoarthritis, left hip: Secondary | ICD-10-CM | POA: Diagnosis present

## 2020-12-23 DIAGNOSIS — M25562 Pain in left knee: Secondary | ICD-10-CM | POA: Insufficient documentation

## 2020-12-23 DIAGNOSIS — Z6841 Body Mass Index (BMI) 40.0 and over, adult: Secondary | ICD-10-CM | POA: Diagnosis present

## 2020-12-23 DIAGNOSIS — E662 Morbid (severe) obesity with alveolar hypoventilation: Secondary | ICD-10-CM | POA: Diagnosis present

## 2020-12-23 DIAGNOSIS — M170 Bilateral primary osteoarthritis of knee: Secondary | ICD-10-CM | POA: Diagnosis not present

## 2020-12-23 DIAGNOSIS — M1612 Unilateral primary osteoarthritis, left hip: Secondary | ICD-10-CM | POA: Insufficient documentation

## 2020-12-23 DIAGNOSIS — E66813 Obesity, class 3: Secondary | ICD-10-CM

## 2020-12-23 MED ORDER — METHYLPREDNISOLONE ACETATE 80 MG/ML IJ SUSP
INTRAMUSCULAR | Status: AC
  Filled 2020-12-23: qty 1

## 2020-12-23 MED ORDER — METHYLPREDNISOLONE ACETATE 80 MG/ML IJ SUSP
80.0000 mg | Freq: Once | INTRAMUSCULAR | Status: AC
  Administered 2020-12-23: 80 mg via INTRA_ARTICULAR

## 2020-12-23 MED ORDER — MIDAZOLAM HCL 5 MG/5ML IJ SOLN
INTRAMUSCULAR | Status: AC
  Filled 2020-12-23: qty 5

## 2020-12-23 MED ORDER — FENTANYL CITRATE (PF) 100 MCG/2ML IJ SOLN
INTRAMUSCULAR | Status: AC
  Filled 2020-12-23: qty 2

## 2020-12-23 MED ORDER — LIDOCAINE HCL (PF) 1 % IJ SOLN
INTRAMUSCULAR | Status: AC
  Filled 2020-12-23: qty 5

## 2020-12-23 MED ORDER — MIDAZOLAM HCL 5 MG/5ML IJ SOLN
1.0000 mg | INTRAMUSCULAR | Status: DC | PRN
  Administered 2020-12-23: 1 mg via INTRAVENOUS

## 2020-12-23 MED ORDER — LIDOCAINE HCL (PF) 2 % IJ SOLN
INTRAMUSCULAR | Status: AC
  Filled 2020-12-23: qty 5

## 2020-12-23 MED ORDER — ROPIVACAINE HCL 2 MG/ML IJ SOLN
5.0000 mL | Freq: Once | INTRAMUSCULAR | Status: AC
  Administered 2020-12-23: 5 mL via INTRA_ARTICULAR

## 2020-12-23 MED ORDER — LIDOCAINE HCL 2 % IJ SOLN
20.0000 mL | Freq: Once | INTRAMUSCULAR | Status: AC
  Administered 2020-12-23: 200 mg

## 2020-12-23 MED ORDER — ROPIVACAINE HCL 2 MG/ML IJ SOLN
9.0000 mL | Freq: Once | INTRAMUSCULAR | Status: AC
  Administered 2020-12-23: 9 mL via INTRA_ARTICULAR

## 2020-12-23 MED ORDER — LIDOCAINE HCL (PF) 1 % IJ SOLN
5.0000 mL | Freq: Once | INTRAMUSCULAR | Status: AC
  Administered 2020-12-23: 5 mL

## 2020-12-23 MED ORDER — IOHEXOL 180 MG/ML  SOLN: 10.0000 mL | Freq: Once | INTRAMUSCULAR | Status: DC

## 2020-12-23 MED ORDER — LACTATED RINGERS IV SOLN
1000.0000 mL | Freq: Once | INTRAVENOUS | Status: AC
  Administered 2020-12-23: 1000 mL via INTRAVENOUS

## 2020-12-23 MED ORDER — ROPIVACAINE HCL 2 MG/ML IJ SOLN
INTRAMUSCULAR | Status: AC
  Filled 2020-12-23: qty 10

## 2020-12-23 MED ORDER — SODIUM HYALURONATE (VISCOSUP) 20 MG/2ML IX SOSY
2.0000 mL | PREFILLED_SYRINGE | Freq: Once | INTRA_ARTICULAR | Status: AC
  Administered 2020-12-23: 2 mL via INTRA_ARTICULAR

## 2020-12-23 MED ORDER — FENTANYL CITRATE (PF) 100 MCG/2ML IJ SOLN
25.0000 ug | INTRAMUSCULAR | Status: DC | PRN
  Administered 2020-12-23: 50 ug via INTRAVENOUS

## 2020-12-23 NOTE — Progress Notes (Signed)
PROVIDER NOTE: Information contained herein reflects review and annotations entered in association with encounter. Interpretation of such information and data should be left to medically-trained personnel. Information provided to patient can be located elsewhere in the medical record under "Patient Instructions". Document created using STT-dictation technology, any transcriptional errors that may result from process are unintentional.    Patient: [name]  Service Category: Procedure  Provider: Gaspar Cola, MD  DOB: 1935-01-18  DOS: 12/23/2020  Location: [facility]  MRN: [id number]  Setting: Ambulatory - outpatient  Referring Provider: Marguerita Merles, MD  Type: Established Patient  Specialty: Interventional Pain Management  PCP: Marguerita Merles, MD   Primary Reason for Visit: Interventional Pain Management Treatment. CC: Hip Pain (Left hip pain)  Procedure #1:  Anesthesia, Analgesia, Anxiolysis:  Type: Therapeutic Intra-Articular Hyalgan Knee Injection #1  Region: Lateral infrapatellar Knee Region Level: Knee Joint Laterality: Left knee  Type: Local Anesthesia Indication(s): Analgesia         Local Anesthetic: Lidocaine 1-2% Route: Infiltration ([place]/IM) IV Access: Declined Sedation: Declined   Position: Sitting   Indications: 1. Chronic knee pain (2ry area of Pain) (Bilateral)   2. Osteoarthritis of knees (Bilateral)   3. Secondary osteoarthritis of knee (Bilateral)    Procedure #2:  Anesthesia, Analgesia, Anxiolysis:  Type: Intra-Articular Hip Injection #2  Primary Purpose: Diagnostic Region: Posterolateral hip joint area. Level: Lower pelvic and hip joint level. Target Area: Superior aspect of the hip joint cavity, going thru the superior portion of the capsular ligament. Approach: Posterolateral approach. Laterality: Left  Type: Moderate (Conscious) Sedation combined with Local Anesthesia Indication(s): Analgesia and Anxiety Route: Intravenous  (IV) IV Access: Secured Sedation: Meaningful verbal contact was maintained at all times during the procedure  Local Anesthetic: Lidocaine 1-2%  Position: Lateral Decubitus with bad side up Prepped Area: Entire Posterolateral hip area. DuraPrep (Iodine Povacrylex [0.7% available iodine] and Isopropyl Alcohol, 74% w/w)   Indications: 1. Chronic hip pain (1ry area of Pain) (Left)   2. Osteoarthritis of hip (Left)   3. Unilateral post-traumatic osteoarthritis of hip (Left)   4. Osteoarthritis of knees (Bilateral)   5. Secondary osteoarthritis of knee (Bilateral)   6. Chronic knee pain (2ry area of Pain) (Bilateral)   7. Chronic anticoagulation (Plavix)   8. Class 3 obesity with alveolar hypoventilation, serious comorbidity, and body mass index (BMI) of 40.0 to 44.9 in adult [facility])    Pain Score: Pre-procedure: 10-Worst pain ever/10 Post-procedure: 0-No pain/10   Pre-op H&P Assessment:  Mr. Tanori is a 86 y.o. (year old), male patient, seen today for interventional treatment. He  has a past surgical history that includes Total hip arthroplasty; Coronary/Graft Acute MI Revascularization (N/A, 04/01/2019); CORONARY ANGIOGRAPHY (N/A, 04/01/2019); Cardiac catheterization; and Coronary angioplasty with stent. Mr. Gulas has a current medication list which includes the following prescription(s): adult aspirin regimen, albuterol, amlodipine, atorvastatin, calcium carbonate, carvedilol, vitamin d3, fluticasone, furosemide, gabapentin, ipratropium, isosorbide mononitrate, magnesium oxide, nitroglycerin, quetiapine, tizanidine, trelegy ellipta, and clopidogrel, and the following Facility-Administered Medications: fentanyl, iohexol, and midazolam. His primarily concern today is the Hip Pain (Left hip pain)  Initial Vital Signs:  Pulse/HCG Rate: 63ECG Heart Rate: 74 Temp: (!) 97 F (36.1 C) Resp: 16 BP: 122/62 SpO2: 96 %  BMI: Estimated body mass index is 42.29 kg/m as calculated from the following:    Height as of this encounter: 5\' 7"  (1.702 m).   Weight as of this encounter: 270 lb (122.5 kg).  Risk Assessment: Allergies: Reviewed.  He is allergic to chlorthalidone, ace inhibitors, hydrochlorothiazide, lisinopril, losartan, other, and benazepril-hydrochlorothiazide.  Allergy Precautions: None required Coagulopathies: Reviewed. None identified.  Blood-thinner therapy: None at this time Active Infection(s): Reviewed. None identified. Mr. Provencal is afebrile  Site Confirmation: Mr. Mccabe was asked to confirm the procedure and laterality before marking the site Procedure checklist: Completed Consent: Before the procedure and under the influence of no sedative(s), amnesic(s), or anxiolytics, the patient was informed of the treatment options, risks and possible complications. To fulfill our ethical and legal obligations, as recommended by the American Medical Association's Code of Ethics, I have informed the patient of my clinical impression; the nature and purpose of the treatment or procedure; the risks, benefits, and possible complications of the intervention; the alternatives, including doing nothing; the risk(s) and benefit(s) of the alternative treatment(s) or procedure(s); and the risk(s) and benefit(s) of doing nothing. The patient was provided information about the general risks and possible complications associated with the procedure. These may include, but are not limited to: failure to achieve desired goals, infection, bleeding, organ or nerve damage, allergic reactions, paralysis, and death. In addition, the patient was informed of those risks and complications associated to the procedure, such as failure to decrease pain; infection; bleeding; organ or nerve damage with subsequent damage to sensory, motor, and/or autonomic systems, resulting in permanent pain, numbness, and/or weakness of one or several areas of the body; allergic reactions; (i.e.: anaphylactic reaction); and/or  death. Furthermore, the patient was informed of those risks and complications associated with the medications. These include, but are not limited to: allergic reactions (i.e.: anaphylactic or anaphylactoid reaction(s)); adrenal axis suppression; blood sugar elevation that in diabetics may result in ketoacidosis or comma; water retention that in patients with history of congestive heart failure may result in shortness of breath, pulmonary edema, and decompensation with resultant heart failure; weight gain; swelling or edema; medication-induced neural toxicity; particulate matter embolism and blood vessel occlusion with resultant organ, and/or nervous system infarction; and/or aseptic necrosis of one or more joints. Finally, the patient was informed that Medicine is not an exact science; therefore, there is also the possibility of unforeseen or unpredictable risks and/or possible complications that may result in a catastrophic outcome. The patient indicated having understood very clearly. We have given the patient no guarantees and we have made no promises. Enough time was given to the patient to ask questions, all of which were answered to the patient's satisfaction. Mr. Lona has indicated that he wanted to continue with the procedure. Attestation: I, the ordering provider, attest that I have discussed with the patient the benefits, risks, side-effects, alternatives, likelihood of achieving goals, and potential problems during recovery for the procedure that I have provided informed consent. Date  Time: 12/23/2020 10:57 AM  Pre-Procedure Preparation:  Monitoring: As per clinic protocol. Respiration, ETCO2, SpO2, BP, heart rate and rhythm monitor placed and checked for adequate function Safety Precautions: Patient was assessed for positional comfort and pressure points before starting the procedure. Time-out: I initiated and conducted the "Time-out" before starting the procedure, as per protocol. The patient  was asked to participate by confirming the accuracy of the "Time Out" information. Verification of the correct person, site, and procedure were performed and confirmed by me, the nursing staff, and the patient. "Time-out" conducted as per Joint Commission's Universal Protocol (UP.01.01.01). Time: 1203 ($* ml of fluid drawn out of left hip and specimen sent to the lab)  Description of Procedure #1:  Target Area: Knee Joint Approach:  Just above the Lateral tibial plateau, lateral to the infrapatellar tendon. Area Prepped: Entire knee area, from the mid-thigh to the mid-shin. DuraPrep (Iodine Povacrylex [0.7% available iodine] and Isopropyl Alcohol, 74% w/w) Safety Precautions: Aspiration looking for blood return was conducted prior to all injections. At no point did we inject any substances, as a needle was being advanced. No attempts were made at seeking any paresthesias. Safe injection practices and needle disposal techniques used. Medications properly checked for expiration dates. SDV (single dose vial) medications used. Description of the Procedure: Protocol guidelines were followed. The patient was placed in position over the fluoroscopy table. The target area was identified and the area prepped in the usual manner. Skin & deeper tissues infiltrated with local anesthetic. Appropriate amount of time allowed to pass for local anesthetics to take effect. The procedure needles were then advanced to the target area. Proper needle placement secured. Negative aspiration confirmed. Solution injected in intermittent fashion, asking for systemic symptoms every 0.5cc of injectate. The needles were then removed and the area cleansed, making sure to leave some of the prepping solution back to take advantage of its long term bactericidal properties. Vitals:   12/23/20 1223 12/23/20 1233 12/23/20 1243 12/23/20 1253  BP: (!) 130/94 136/79 131/74 132/80  Pulse: 86     Resp: (!) 24 (!) 25 (!) 27 20  Temp:  (!) 97.5  F (36.4 C)  (!) 97.4 F (36.3 C)  TempSrc:      SpO2: 95% 96% 99% 99%  Weight:      Height:        Start Time: 1203 hrs. Materials:  Needle(s) Type: Regular needle Gauge: 25G Length: 1.5-in Medication(s): Please see orders for medications and dosing details.  Imaging Guidance for procedure #1:  Type of Imaging Technique: None used Indication(s): N/A Exposure Time: No patient exposure Contrast: None used. Fluoroscopic Guidance: N/A Ultrasound Guidance: N/A Interpretation: N/A  Description of Procedure #2:  Safety Precautions: Aspiration looking for blood return was conducted prior to all injections. At no point did we inject any substances, as a needle was being advanced. No attempts were made at seeking any paresthesias. Safe injection practices and needle disposal techniques used. Medications properly checked for expiration dates. SDV (single dose vial) medications used. Description of the Procedure: Protocol guidelines were followed. The patient was placed in position over the fluoroscopy table. The target area was identified and the area prepped in the usual manner. Skin & deeper tissues infiltrated with local anesthetic. Appropriate amount of time allowed to pass for local anesthetics to take effect. The procedure needles were then advanced to the target area. Proper needle placement secured.   Arthrocentesis: Aspiration of the joint yielded 48 mL of a serosanguineous synovial fluid which was sent out to the lab for analysis.    Steroid injection: Once I had drained the left hip of this fluid, then I proceeded to inject the local anesthetic and steroid solution in intermittent fashion, asking for systemic symptoms every 0.5cc of injectate. The needles were then removed and the area cleansed, making sure to leave some of the prepping solution back to take advantage of its long term bactericidal properties.  Vitals:   12/23/20 1223 12/23/20 1233 12/23/20 1243 12/23/20 1253  BP:  (!) 130/94 136/79 131/74 132/80  Pulse: 86     Resp: (!) 24 (!) 25 (!) 27 20  Temp:  (!) 97.5 F (36.4 C)  (!) 97.4 F (36.3 C)  TempSrc:      SpO2:  95% 96% 99% 99%  Weight:      Height:       End Time: 1224 (1205 finish knee) hrs. Materials:  Needle(s) Type: Spinal Needle Gauge: 22G Length: 7-in Medication(s): Please see orders for medications and dosing details.  Imaging Guidance (Non-Spinal) for procedure #2:  Type of Imaging Technique: Fluoroscopy Guidance (Non-Spinal) Indication(s): Assistance in needle guidance and placement for procedures requiring needle placement in or near specific anatomical locations not easily accessible without such assistance. Exposure Time: Please see nurses notes. Contrast: Before injecting any contrast, we confirmed that the patient did not have an allergy to iodine, shellfish, or radiological contrast. Once satisfactory needle placement was completed at the desired level, radiological contrast was injected. Contrast injected under live fluoroscopy. No contrast complications. See chart for type and volume of contrast used. Fluoroscopic Guidance: I was personally present during the use of fluoroscopy. "Tunnel Vision Technique" used to obtain the best possible view of the target area. Parallax error corrected before commencing the procedure. "Direction-depth-direction" technique used to introduce the needle under continuous pulsed fluoroscopy. Once target was reached, antero-posterior, oblique, and lateral fluoroscopic projection used confirm needle placement in all planes. Images permanently stored in EMR. Interpretation: I personally interpreted the imaging intraoperatively. Adequate needle placement confirmed in multiple planes. Appropriate spread of contrast into desired area was observed. No evidence of afferent or efferent intravascular uptake. Permanent images saved into the patient's record.  Antibiotic Prophylaxis:   Anti-infectives (From  admission, onward)   None     Indication(s): None identified  Post-operative Assessment:  Post-procedure Vital Signs:  Pulse/HCG Rate: 8680 Temp: (!) 97.4 F (36.3 C) Resp: 20 BP: 132/80 SpO2: 99 %  EBL: None  Complications: No immediate post-treatment complications observed by team, or reported by patient.  Note: The patient tolerated the entire procedure well. A repeat set of vitals were taken after the procedure and the patient was kept under observation following institutional policy, for this type of procedure. Post-procedural neurological assessment was performed, showing return to baseline, prior to discharge. The patient was provided with post-procedure discharge instructions, including a section on how to identify potential problems. Should any problems arise concerning this procedure, the patient was given instructions to immediately contact us, at any time, without hesitation. In any case, we plan to contact the patient by telephone for a follow-up status report regarding this interventional procedure.  Comments:  No additional relevant information.  Plan of Care  Orders:  Orders Placed This Encounter  Procedures  . KNEE INJECTION    Hyalgan knee injection to be done by MD.    Scheduling Instructions:     Procedure: Intra-articular Hyalgan Knee injection #1     Side(s): Left Knee     Sedation: None     Timeframe: Today    Order Specific Question:   Where will this procedure be performed?    Answer:   [facility]  . HIP INJECTION    Scheduling Instructions:     Side: Left-sided     Sedation: With Sedation.     Timeframe: Today  . Body fluid culture  . DG PAIN [facility] C-ARM 1-60 MIN NO REPORT    Intraoperative interpretation by procedural physician at [place].    Standing Status:   Standing    Number of Occurrences:   1    Order Specific Question:   Reason for exam:    Answer:   Assistance in needle guidance and placement for procedures  requiring needle  placement in or near specific anatomical locations not easily accessible without such assistance.  . Synovial Fluid Panel  . Synovial fluid, cell count  . Informed Consent Details: Physician/Practitioner Attestation; Transcribe to consent form and obtain patient signature    Note: Always confirm laterality of pain with Mr. Henkels, before procedure. Transcribe to consent form and obtain patient signature.    Order Specific Question:   Physician/Practitioner attestation of informed consent for procedure/surgical case    Answer:   I, the physician/practitioner, attest that I have discussed with the patient the benefits, risks, side effects, alternatives, likelihood of achieving goals and potential problems during recovery for the procedure that I have provided informed consent.    Order Specific Question:   Procedure    Answer:   Therapeutic, left sided, intra-articular Hyalgan knee injection    Order Specific Question:   Physician/Practitioner performing the procedure    Answer:   Sade Hollon A. Dossie Arbour, MD    Order Specific Question:   Indication/Reason    Answer:   Chronic left knee pain secondary to primary osteoarthritis of the knee  . Informed Consent Details: Physician/Practitioner Attestation; Transcribe to consent form and obtain patient signature    Nursing Order: Transcribe to consent form and obtain patient signature. Note: Always confirm laterality of pain with Mr. Enriquez, before procedure.    Order Specific Question:   Physician/Practitioner attestation of informed consent for procedure/surgical case    Answer:   I, the physician/practitioner, attest that I have discussed with the patient the benefits, risks, side effects, alternatives, likelihood of achieving goals and potential problems during recovery for the procedure that I have provided informed consent.    Order Specific Question:   Procedure    Answer:   Hip injection    Order Specific Question:    Physician/Practitioner performing the procedure    Answer:   Margarete Horace A. Dossie Arbour, MD    Order Specific Question:   Indication/Reason    Answer:   Hip Joint Pain (Arthralgia)  . Provide equipment / supplies at bedside    "Block Tray" (Disposable  single use) Needle type: SpinalSpinal Amount/quantity: 1 Size: Long (7-inch) Gauge: 22G    Standing Status:   Standing    Number of Occurrences:   1    Order Specific Question:   Specify    Answer:   Block Tray   Chronic Opioid Analgesic:  Tramadol 50 mg, 1 tab p.o. 4 times daily (200 mg/day of tramadol) (20 MME/day) MME/day: 20 mg/day   Medications ordered for procedure: Meds ordered this encounter  Medications  . lidocaine (PF) (XYLOCAINE) 1 % injection 5 mL  . ropivacaine (PF) 2 mg/mL (0.2%) (NAROPIN) injection 5 mL  . Sodium Hyaluronate SOSY 2 mL  . iohexol (OMNIPAQUE) 180 MG/ML injection 10 mL    Must be Myelogram-compatible. If not available, you may substitute with a water-soluble, non-ionic, hypoallergenic, myelogram-compatible radiological contrast medium.  Marland Kitchen lidocaine (XYLOCAINE) 2 % (with pres) injection 400 mg  . lactated ringers infusion 1,000 mL  . midazolam (VERSED) 5 MG/5ML injection 1-2 mg    Make sure Flumazenil is available in the pyxis when using this medication. If oversedation occurs, administer 0.2 mg IV over 15 sec. If after 45 sec no response, administer 0.2 mg again over 1 min; may repeat at 1 min intervals; not to exceed 4 doses (1 mg)  . fentaNYL (SUBLIMAZE) injection 25-50 mcg    Make sure Narcan is available in the pyxis when using this medication. In the  event of respiratory depression (RR< 8/min): Titrate NARCAN (naloxone) in increments of 0.1 to 0.2 mg IV at 2-3 minute intervals, until desired degree of reversal.  . ropivacaine (PF) 2 mg/mL (0.2%) (NAROPIN) injection 9 mL  . methylPREDNISolone acetate (DEPO-MEDROL) injection 80 mg   Medications administered: We administered lidocaine (PF), ropivacaine  (PF) 2 mg/mL (0.2%), Sodium Hyaluronate, lidocaine, lactated ringers, midazolam, fentaNYL, ropivacaine (PF) 2 mg/mL (0.2%), and methylPREDNISolone acetate.  See the medical record for exact dosing, route, and time of administration.  Follow-up plan:   Return in about 2 weeks (around 01/06/2021) for Procedure (no sedation): (L) Hyalgan #2, (Blood Thinner Protocol).       Interventional Therapies  Risk  Complexity Considerations:   Class III morbid obesity (BMI 42.29 kg/m) Difficulty breathing and tolerating prone position NOTE: PLAVIX Anticoagulation (Stop: 7-10 days  Restart: 2 hours) Chronic CHF  CAD with history of MI  History of alcohol abuse  Chronic kidney disease stage III  COPD  OSA  Carotid artery disease  Abnormal heart rhythm with PVCs during procedure    Planned  Pending:   Possible series of 5 left knee Hyalgan injections    Under consideration:   Pending to order bilateral shoulder x-rays.  Possible need for neurosurgical evaluation.  We will also consider nerve conduction testing of the lower extremities. Diagnostic left L2-3 LESI #1  Diagnostic left L3-4 LESI #1  Diagnostic left L4-5 LESI #1  Diagnostic bilateral L2 TFESI #1  Diagnostic bilateral L3 TFESI #1  Diagnostic bilateral L4 TFESI #1  Diagnostic bilateral lumbar facet block #1  Diagnostic bilateral femoral nerve and obturator nerve block #1  Diagnostic bilateral IA knee joint injection (steroid) #1  Possible series of bilateral IA Hyalgan knee injections  Diagnostic bilateral genicular nerve block #1    Completed:   Diagnostic left L2-3 LESI x1 (11/16/2020) (100/100/100 x2 days/<50) Diagnostic/therapeutic left IA hip injection x2 (11/30/2020) (12/23/2020) Diagnostic/therapeutic left subgluteus maximus bursa injection x1 (11/30/2020)  Therapeutic left IA Hyalgan knee injection x1 (12/23/2020)   Palliative options:   Therapeutic left L2-3 LESI #2     Recent Visits Date Type Provider Dept  12/15/20  Office Visit Milinda Pointer, MD [facility]  11/30/20 Procedure visit Milinda Pointer, MD [facility]  11/29/20 Office Visit Milinda Pointer, MD [facility]  11/16/20 Procedure visit Milinda Pointer, MD [facility]  10/25/20 Office Visit Milinda Pointer, MD [facility]  Showing recent visits within past 90 days and meeting all other requirements Today's Visits Date Type Provider Dept  12/23/20 Procedure visit Milinda Pointer, MD [facility]  Showing today's visits and meeting all other requirements Future Appointments Date Type Provider Dept  01/06/21 Appointment Milinda Pointer, MD [facility]  Showing future appointments within next 90 days and meeting all other requirements  Disposition: Discharge home  Discharge (Date  Time): 12/23/2020; 1300 hrs.   Primary Care Physician: Marguerita Merles, MD Location: [facility] Note by: Gaspar Cola, MD Date: 12/23/2020; Time: 3:03 PM  Disclaimer:  Medicine is not an Chief Strategy Officer. The only guarantee in medicine is that nothing is guaranteed. It is important to note that the decision to proceed with this intervention was based on the information collected from the patient. The Data and conclusions were drawn from the patient's questionnaire, the interview, and the physical examination. Because the information was provided in large part by the patient, it cannot be guaranteed that it has not been purposely  or unconsciously manipulated. Every effort has been made to obtain as much relevant data as possible for this evaluation. It is important to note that the conclusions that lead to this procedure are derived in large part from the available data. Always take into account that the treatment will also be dependent on availability of resources and existing treatment guidelines, considered by other Pain Management Practitioners as  being common knowledge and practice, at the time of the intervention. For Medico-Legal purposes, it is also important to point out that variation in procedural techniques and pharmacological choices are the acceptable norm. The indications, contraindications, technique, and results of the above procedure should only be interpreted and judged by a Board-Certified Interventional Pain Specialist with extensive familiarity and expertise in the same exact procedure and technique.

## 2020-12-23 NOTE — Patient Instructions (Addendum)
______________________________________________________________________________________________  Body mass index (BMI)  Body mass index (BMI) is a common tool for deciding whether a person has an appropriate body weight.  It measures a persons weight in relation to their height.   According to the [employer] of health (NIH): Marland Kitchen A BMI of less than 18.5 means that a person is underweight. . A BMI of between 18.5 and 24.9 is ideal. . A BMI of between 25 and 29.9 is overweight. . A BMI over 30 indicates obesity.  Weight Management Required  URGENT: Your weight has been found to be adversely affecting your health.  Dear Mr. Feighner:  Your current Estimated body mass index is 42.29 kg/m as calculated from the following:   Height as of 12/15/20: '5\' 7"'  (1.702 m).   Weight as of 12/15/20: 270 lb (122.5 kg).  Please use the table below to identify your weight category and associated incidence of chronic pain, secondary to your weight.  Body Mass Index (BMI) Classification BMI level (kg/m2) Category Associated incidence of chronic pain  <18  Underweight   18.5-24.9 Ideal body weight   25-29.9 Overweight  20%  30-34.9 Obese (Class I)  68%  35-39.9 Severe obesity (Class II)  136%  >40 Extreme obesity (Class III)  254%   In addition: You will be considered "Morbidly Obese", if your BMI is above 30 and you have one or more of the following conditions which are known to be caused and/or directly associated with obesity: 1.    Type 2 Diabetes (Which in turn can lead to cardiovascular diseases (CVD), stroke, peripheral vascular diseases (PVD), retinopathy, nephropathy, and neuropathy) 2.    Cardiovascular Disease (High Blood Pressure; Congestive Heart Failure; High Cholesterol; Coronary Artery Disease; Angina; or History of Heart Attacks) 3.    Breathing problems (Asthma; obesity-hypoventilation syndrome; obstructive sleep apnea; chronic inflammatory airway disease; reactive airway disease; or  shortness of breath) 4.    Chronic kidney disease 5.    Liver disease (nonalcoholic fatty liver disease) 6.    High blood pressure 7.    Acid reflux (gastroesophageal reflux disease; heartburn) 8.    Osteoarthritis (OA) (with any of the following: hip pain; knee pain; and/or low back pain) 9.    Low back pain (Lumbar Facet Syndrome; and/or Degenerative Disc Disease) 10.  Hip pain (Osteoarthritis of hip) (For every 1 lbs of added body weight, there is a 2 lbs increase in pressure inside of each hip articulation. 1:2 mechanical relationship) 11.  Knee pain (Osteoarthritis of knee) (For every 1 lbs of added body weight, there is a 4 lbs increase in pressure inside of each knee articulation. 1:4 mechanical relationship) (patients with a BMI>30 kg/m2 were 6.8 times more likely to develop knee OA than normal-weight individuals) 12.  Cancer: Epidemiological studies have shown that obesity is a risk factor for: post-menopausal breast cancer; cancers of the endometrium, colon and kidney cancer; malignant adenomas of the oesophagus. Obese subjects have an approximately 1.5-3.5-fold increased risk of developing these cancers compared with normal-weight subjects, and it has been estimated that between 15 and 45% of these cancers can be attributed to overweight. More recent studies suggest that obesity may also increase the risk of other types of cancer, including pancreatic, hepatic and gallbladder cancer. (Ref: Obesity and cancer. Pischon T, Nthlings U, [employer] H. Proc Nutr Soc. 2008 May;67(2):128-45. doi: 59.5638/V5643329518841660.) The International Agency for Research on Cancer (IARC) has identified 13 cancers associated with overweight and obesity: meningioma, multiple myeloma, adenocarcinoma of the esophagus, and  cancers of the thyroid, postmenopausal breast cancer, gallbladder, stomach, liver, pancreas, kidney, ovaries, uterus, colon and rectal (colorectal) cancers. 61 percent of all cancers diagnosed in women  and 24 percent of those diagnosed in men are associated with overweight and obesity.  Recommendation: At this point it is urgent that you take a step back and concentrate in loosing weight. Dedicate 100% of your efforts on this task. Nothing else will improve your health more than bringing your weight down and your BMI to less than 30. If you are here, you probably have chronic pain. We know that most chronic pain patients have difficulty exercising secondary to their pain. For this reason, you must rely on proper nutrition and diet in order to lose the weight. If your BMI is above 40, you should seriously consider bariatric surgery. A realistic goal is to lose 10% of your body weight over a period of 12 months.  Be honest to yourself, if over time you have unsuccessfully tried to lose weight, then it is time for you to seek professional help and to enter a medically supervised weight management program, and/or undergo bariatric surgery. Stop procrastinating.   Pain management considerations:  1.    Pharmacological Problems: Be advised that the use of opioid analgesics (oxycodone; hydrocodone; morphine; methadone; codeine; and all of their derivatives) have been associated with decreased metabolism and weight gain.  For this reason, should we see that you are unable to lose weight while taking these medications, it may become necessary for [place] to taper down and indefinitely discontinue them.  2.    Technical Problems: The incidence of successful interventional therapies decreases as the patient's BMI increases. It is much more difficult to accomplish a safe and effective interventional therapy on a patient with a BMI above 35. 3.    Radiation Exposure Problems: The x-rays machine, used to accomplish injection therapies, will automatically increase their x-ray output in order to capture an appropriate bone image. This means that radiation exposure increases exponentially with the patient's BMI. (The higher the  BMI, the higher the radiation exposure.) Although the level of radiation used at a given time is still safe to the patient, it is not for the physician and/or assisting staff. Unfortunately, radiation exposure is accumulative. Because physicians and the staff have to do procedures and be exposed on a daily basis, this can result in health problems such as cancer and radiation burns. Radiation exposure to the staff is monitored by the radiation batches that they wear. The exposure levels are reported back to the staff on a quarterly basis. Depending on levels of exposure, physicians and staff may be obligated by law to decrease this exposure. This means that they have the right and obligation to refuse providing therapies where they may be overexposed to radiation. For this reason, physicians may decline to offer therapies such as radiofrequency ablation or implants to patients with a BMI above 40. 4.    Current Trends: Be advised that the current trend is to no longer offer certain therapies to patients with a BMI equal to, or above 35, due to increase perioperative risks, increased technical procedural difficulties, and excessive radiation exposure to healthcare personnel.  ______________________________________________________________________________________________    ____________________________________________________________________________________________  Blood Thinners  IMPORTANT NOTICE:  If you take any of these, make sure to notify the nursing staff.  Failure to do so may result in injury.  Recommended time intervals to stop and restart blood-thinners, before & after invasive procedures  Generic Name Brand Name  Stop Time. Must be stopped at least this long before procedures. After procedures, wait at least this long before re-starting.  Abciximab Reopro 15 days 2 hrs  Alteplase Activase 10 days 10 days  Anagrelide Agrylin    Apixaban Eliquis 3 days 6 hrs  Cilostazol Pletal 3 days 5  hrs  Clopidogrel Plavix 7-10 days 2 hrs  Dabigatran Pradaxa 5 days 6 hrs  Dalteparin Fragmin 24 hours 4 hrs  Dipyridamole Aggrenox 11days 2 hrs  Edoxaban Lixiana; Savaysa 3 days 2 hrs  Enoxaparin  Lovenox 24 hours 4 hrs  Eptifibatide Integrillin 8 hours 2 hrs  Fondaparinux  Arixtra 72 hours 12 hrs  Prasugrel Effient 7-10 days 6 hrs  Reteplase Retavase 10 days 10 days  Rivaroxaban Xarelto 3 days 6 hrs  Ticagrelor Brilinta 5-7 days 6 hrs  Ticlopidine Ticlid 10-14 days 2 hrs  Tinzaparin Innohep 24 hours 4 hrs  Tirofiban Aggrastat 8 hours 2 hrs  Warfarin Coumadin 5 days 2 hrs   Other medications with blood-thinning effects  Product indications Generic (Brand) names Note  Cholesterol Lipitor Stop 4 days before procedure  Blood thinner (injectable) Heparin (LMW or LMWH Heparin) Stop 24 hours before procedure  Cancer Ibrutinib (Imbruvica) Stop 7 days before procedure  Malaria/Rheumatoid Hydroxychloroquine (Plaquenil) Stop 11 days before procedure  Thrombolytics  10 days before or after procedures   Over-the-counter (OTC) Products with blood-thinning effects  Product Common names Stop Time  Aspirin > 325 mg Goody Powders, Excedrin, etc. 11 days  Aspirin ? 81 mg  7 days  Fish oil  4 days  Garlic supplements  7 days  Ginkgo biloba  36 hours  Ginseng  24 hours  NSAIDs Ibuprofen, Naprosyn, etc. 3 days  Vitamin E  4 days   ____________________________________________________________________________________________  ____________________________________________________________________________________________  Post-Procedure Discharge Instructions  Instructions:  Apply ice:   Purpose: This will minimize any swelling and discomfort after procedure.   When: Day of procedure, as soon as you get home.  How: Fill a plastic sandwich bag with crushed ice. Cover it with a small towel and apply to injection site.  How long: (15 min on, 15 min off) Apply for 15 minutes then remove x 15  minutes.  Repeat sequence on day of procedure, until you go to bed.  Apply heat:   Purpose: To treat any soreness and discomfort from the procedure.  When: Starting the next day after the procedure.  How: Apply heat to procedure site starting the day following the procedure.  How long: May continue to repeat daily, until discomfort goes away.  Food intake: Start with clear liquids (like water) and advance to regular food, as tolerated.   Physical activities: Keep activities to a minimum for the first 8 hours after the procedure. After that, then as tolerated.  Driving: If you have received any sedation, be responsible and do not drive. You are not allowed to drive for 24 hours after having sedation.  Blood thinner: (Applies only to those taking blood thinners) You may restart your blood thinner 6 hours after your procedure.  Insulin: (Applies only to Diabetic patients taking insulin) As soon as you can eat, you may resume your normal dosing schedule.  Infection prevention: Keep procedure site clean and dry. Shower daily and clean area with soap and water.  Post-procedure Pain Diary: Extremely important that this be done correctly and accurately. Recorded information will be used to determine the next step in treatment. For the purpose of accuracy, follow these rules:  Evaluate only  the area treated. Do not report or include pain from an untreated area. For the purpose of this evaluation, ignore all other areas of pain, except for the treated area.  After your procedure, avoid taking a long nap and attempting to complete the pain diary after you wake up. Instead, set your alarm clock to go off every hour, on the hour, for the initial 8 hours after the procedure. Document the duration of the numbing medicine, and the relief you are getting from it.  Do not go to sleep and attempt to complete it later. It will not be accurate. If you received sedation, it is likely that you were given a  medication that may cause amnesia. Because of this, completing the diary at a later time may cause the information to be inaccurate. This information is needed to plan your care.  Follow-up appointment: Keep your post-procedure follow-up evaluation appointment after the procedure (usually 2 weeks for most procedures, 6 weeks for radiofrequencies). DO NOT FORGET to bring you pain diary with you.   Expect: (What should I expect to see with my procedure?)  From numbing medicine (AKA: Local Anesthetics): Numbness or decrease in pain. You may also experience some weakness, which if present, could last for the duration of the local anesthetic.  Onset: Full effect within 15 minutes of injected.  Duration: It will depend on the type of local anesthetic used. On the average, 1 to 8 hours.   From steroids (Applies only if steroids were used): Decrease in swelling or inflammation. Once inflammation is improved, relief of the pain will follow.  Onset of benefits: Depends on the amount of swelling present. The more swelling, the longer it will take for the benefits to be seen. In some cases, up to 10 days.  Duration: Steroids will stay in the system x 2 weeks. Duration of benefits will depend on multiple posibilities including persistent irritating factors.  Side-effects: If present, they may typically last 2 weeks (the duration of the steroids).  Frequent: Cramps (if they occur, drink Gatorade and take over-the-counter Magnesium 450-500 mg once to twice a day); water retention with temporary weight gain; increases in blood sugar; decreased immune system response; increased appetite.  Occasional: Facial flushing (red, warm cheeks); mood swings; menstrual changes.  Uncommon: Long-term decrease or suppression of natural hormones; bone thinning. (These are more common with higher doses or more frequent use. This is why we prefer that our patients avoid having any injection therapies in other practices.)    Very Rare: Severe mood changes; psychosis; aseptic necrosis.  From procedure: Some discomfort is to be expected once the numbing medicine wears off. This should be minimal if ice and heat are applied as instructed.  Call if: (When should I call?)  You experience numbness and weakness that gets worse with time, as opposed to wearing off.  New onset bowel or bladder incontinence. (Applies only to procedures done in the spine)  Emergency Numbers:  Durning business hours (Monday - Thursday, 8:00 AM - 4:00 PM) (Friday, 9:00 AM - 12:00 Noon): [phone]  After hours: [phone]  NOTE: If you are having a problem and are unable connect with, or to talk to a provider, then go to your nearest urgent care or emergency department. If the problem is serious and urgent, please call 911. ____________________________________________________________________________________________   ____________________________________________________________________________________________  Preparing for your procedure (without sedation)  Procedure appointments are limited to planned procedures: . No Prescription Refills. . No disability issues will be discussed. Marland Kitchen  No medication changes will be discussed.  Instructions: . Oral Intake: Do not eat or drink anything for at least 6 hours prior to your procedure. (Exception: Blood Pressure Medication. See below.) . Transportation: Unless otherwise stated by your physician, you may drive yourself after the procedure. . Blood Pressure Medicine: Do not forget to take your blood pressure medicine with a sip of water the morning of the procedure. If your Diastolic (lower reading)is above 100 mmHg, elective cases will be cancelled/rescheduled. . Blood thinners: These will need to be stopped for procedures. Notify our staff if you are taking any blood thinners. Depending on which one you take, there will be specific instructions on how and when to stop  it. . Diabetics on insulin: Notify the staff so that you can be scheduled 1st case in the morning. If your diabetes requires high dose insulin, take only  of your normal insulin dose the morning of the procedure and notify the staff that you have done so. . Preventing infections: Shower with an antibacterial soap the morning of your procedure.  . Build-up your immune system: Take 1000 mg of Vitamin C with every meal (3 times a day) the day prior to your procedure. Marland Kitchen Antibiotics: Inform the staff if you have a condition or reason that requires you to take antibiotics before dental procedures. . Pregnancy: If you are pregnant, call and cancel the procedure. . Sickness: If you have a cold, fever, or any active infections, call and cancel the procedure. . Arrival: You must be in the facility at least 30 minutes prior to your scheduled procedure. . Children: Do not bring any children with you. . Dress appropriately: Bring dark clothing that you would not mind if they get stained. . Valuables: Do not bring any jewelry or valuables.  Reasons to call and reschedule or cancel your procedure: (Following these recommendations will minimize the risk of a serious complication.) . Surgeries: Avoid having procedures within 2 weeks of any surgery. (Avoid for 2 weeks before or after any surgery). . Flu Shots: Avoid having procedures within 2 weeks of a flu shots or . (Avoid for 2 weeks before or after immunizations). . Barium: Avoid having a procedure within 7-10 days after having had a radiological study involving the use of radiological contrast. (Myelograms, Barium swallow or enema study). . Heart attacks: Avoid any elective procedures or surgeries for the initial 6 months after a "Myocardial Infarction" (Heart Attack). . Blood thinners: It is imperative that you stop these medications before procedures. Let us know if you if you take any blood thinner.  . Infection: Avoid procedures during or within two weeks  of an infection (including chest colds or gastrointestinal problems). Symptoms associated with infections include: Localized redness, fever, chills, night sweats or profuse sweating, burning sensation when voiding, cough, congestion, stuffiness, runny nose, sore throat, diarrhea, nausea, vomiting, cold or Flu symptoms, recent or current infections. It is specially important if the infection is over the area that we intend to treat. Marland Kitchen Heart and lung problems: Symptoms that may suggest an active cardiopulmonary problem include: cough, chest pain, breathing difficulties or shortness of breath, dizziness, ankle swelling, uncontrolled high or unusually low blood pressure, and/or palpitations. If you are experiencing any of these symptoms, cancel your procedure and contact your primary care physician for an evaluation.  Remember:  Regular Business hours are:  Monday to Thursday 8:00 AM to 4:00 PM  Provider's Schedule: Milinda Pointer, MD:  Procedure days: Tuesday and Thursday 7:30 AM to  4:00 PM  Gillis Santa, MD:  Procedure days: Monday and Wednesday 7:30 AM to 4:00 PM ____________________________________________________________________________________________

## 2020-12-24 ENCOUNTER — Telehealth: Payer: Self-pay | Admitting: *Deleted

## 2020-12-24 LAB — SYNOVIAL FLUID, CELL COUNT
Eos, Fluid: 2 %
Lining Cells, Synovial: 0 %
Lymphs, Fluid: 2 %
Macrophages Fld: 10 %
Nuc cell # Fld: 974 cells/uL — ABNORMAL HIGH (ref 0–200)
Polys, Fluid: 86 %
RBC, Fluid: 60000 /uL

## 2020-12-24 NOTE — Telephone Encounter (Signed)
Attempted to call for post procedure follow-up. Message left. 

## 2020-12-27 LAB — BODY FLUID CULTURE

## 2020-12-30 ENCOUNTER — Encounter: Payer: Self-pay | Admitting: Cardiovascular Disease

## 2020-12-30 ENCOUNTER — Other Ambulatory Visit: Payer: Self-pay

## 2020-12-30 ENCOUNTER — Ambulatory Visit (INDEPENDENT_AMBULATORY_CARE_PROVIDER_SITE_OTHER): Payer: HMO | Admitting: Cardiovascular Disease

## 2020-12-30 VITALS — BP 108/56 | HR 78 | Ht 67.0 in | Wt 277.0 lb

## 2020-12-30 DIAGNOSIS — E785 Hyperlipidemia, unspecified: Secondary | ICD-10-CM

## 2020-12-30 DIAGNOSIS — I5022 Chronic systolic (congestive) heart failure: Secondary | ICD-10-CM | POA: Diagnosis not present

## 2020-12-30 DIAGNOSIS — I2510 Atherosclerotic heart disease of native coronary artery without angina pectoris: Secondary | ICD-10-CM | POA: Diagnosis not present

## 2020-12-30 DIAGNOSIS — I10 Essential (primary) hypertension: Secondary | ICD-10-CM | POA: Diagnosis not present

## 2020-12-30 DIAGNOSIS — N189 Chronic kidney disease, unspecified: Secondary | ICD-10-CM

## 2020-12-30 NOTE — Patient Instructions (Signed)

## 2020-12-30 NOTE — Progress Notes (Signed)
Cardiology Office Note   Date:  12/30/2020   ID:  ARASH KARSTENS, DOB [birth year], MRN [id number]  PCP:  Marguerita Merles, MD  Cardiologist:   Kathlyn Sacramento, MD   Chief Complaint  Patient presents with  . Follow-up    6 Months follow up. Medications verbally reviewed with patient.       History of Present Illness: AMIERE [name] is a 86 y.o. male who presents for a follow-up visit regarding coronary artery disease and chronic systolic heart failure. He is status post Taxus drug-eluting stent placement to the distal right coronary artery in 2005.  He has extensive medical problems that include borderline diabetes, hypertension, hyperlipidemia, chronic systolic/diastolic heart failure , sleep apnea not on CPAP, morbid obesity, previous tobacco use,  stage III CKD, bipolar disorder and peripheral arterial disease. Echocardiogram in August, 2018 showed an EF of 40-45%, mild to moderate aortic stenosis, mildly dilated right and left atrium. He was hospitalized in April 2020 with inferior ST elevation myocardial infarction.  Cardiac catheterization showed 90% proximal and mid RCA disease which was treated with non-overlapping drug-eluting stents.  The distal RCA stent was patent with mild in-stent restenosis.  Echocardiogram showed an EF of 45 to 50% with severe inferior wall hypokinesis.  He has been doing reasonably well and denies any chest pain, shortness of breath or palpitations.  No significant leg edema and his weight has been relatively stable.  He is mostly bothered by chronic left hip pain.  He was told that he needed surgery but was not a candidate due to his age and comorbidities.  Past Medical History:  Diagnosis Date  . Alcoholism ([place])   . Asthma   . Bipolar affective disorder ([place])   . Chronic combined systolic and diastolic CHF (congestive heart failure) ([place])    a. 08/2016 Echo: EF 40-45%, mild AS, mild to mod MR, mildly dil LA/RA, mild-mod TR; b. 07/2017 Echo: EF 40-45%,  mod LVH, Gr1 DD, mild to mod AS, mildly dil LA, nl RV fxn; c. 03/2019 Echo: EF 45-50%, AS (not severe). Mod dil PA.  . CKD (chronic kidney disease), stage III ([place])   . COPD (chronic obstructive pulmonary disease) ([place])   . Coronary artery disease    a. 01/2004 s/p PCI and Taxus DES to dRCA (3.5 x 12 mm); b. 07/2017 Lexiscan MV: no ischemia. Sm area of apicl thinning, likely attenuation. EF 33% (GI uptake noted)-->Low risk; c. 03/2019 Inf STEMI/PCI: LM nl, LAD min irregs, D1 20ost, RI 20ost, LCX nl, RCA 90p/65m (4.0x26 Resolute Onyx DES), 43m (4.0x15 Resolute Onyx DES), 10d ISR.  Marland Kitchen Degenerative joint disease    knees and hip  . Dyspnea    on exertion  . Essential hypertension   . Hyperlipidemia   . Hypertension    controlled on meds  . Ischemic cardiomyopathy    a. 08/2016 Echo: EF 40-45%; b. 07/2017 Echo: EF 40-45%; c. 03/2019 Echo: EF 45-50%.  . OSA (obstructive sleep apnea)   . Prostate CA [facility])    prostate ca dx 20 yrs ago  . PVD (peripheral vascular disease) ([place])   . Tobacco abuse     Past Surgical History:  Procedure Laterality Date  . CARDIAC CATHETERIZATION    . CORONARY ANGIOGRAPHY N/A 04/01/2019   Procedure: CORONARY ANGIOGRAPHY;  Surgeon: Nelva Bush, MD;  Location: [facility];  Service: Cardiovascular;  Laterality: N/A;  . CORONARY ANGIOPLASTY WITH STENT PLACEMENT     x2  . CORONARY/GRAFT  ACUTE MI REVASCULARIZATION N/A 04/01/2019   Procedure: Coronary/Graft Acute MI Revascularization;  Surgeon: Nelva Bush, MD;  Location: [facility];  Service: Cardiovascular;  Laterality: N/A;  . TOTAL HIP ARTHROPLASTY     right     Current Outpatient Medications  Medication Sig Dispense Refill  . ADULT ASPIRIN REGIMEN 81 MG EC tablet TAKE 1 TABLET BY MOUTH DAILY 30 tablet 2  . albuterol (VENTOLIN HFA) 108 (90 Base) MCG/ACT inhaler USE 2 PUFFS EVERY 4 HOURS AS NEEDED 18 g 5  . amLODipine (NORVASC) 5 MG tablet Take 1 tablet (5 mg total) by mouth daily. 30  tablet 5  . atorvastatin (LIPITOR) 80 MG tablet Take 0.5 tablets (40 mg total) by mouth daily. 30 tablet 4  . azelastine (ASTELIN) 0.1 % nasal spray azelastine 137 mcg (0.1 %) nasal spray aerosol    . calcium carbonate (OSCAL) 1500 (600 Ca) MG TABS tablet Take 1 tablet (1,500 mg total) by mouth 2 (two) times daily with a meal. 60 tablet 2  . carvedilol (COREG) 6.25 MG tablet TAKE 1 TABLET BY MOUTH TWICE A DAY 180 tablet 0  . Cholecalciferol (VITAMIN D3) 125 MCG (5000 UT) CAPS Take 1 capsule (5,000 Units total) by mouth daily with breakfast. Take along with calcium and magnesium. 30 capsule 2  . clopidogrel (PLAVIX) 75 MG tablet Take 1 tablet (75 mg total) by mouth daily. 30 tablet 0  . fluticasone (FLONASE) 50 MCG/ACT nasal spray Place 2 sprays into both nostrils daily.    . furosemide (LASIX) 40 MG tablet TAKE 1 TABLET BY MOUTH DAILY AND TAKE ANADDITIONAL TABLET IN THE AFTERNOON AS NEEDED FOR WEIGHT GAIN AND LEG EDEMA 100 tablet 0  . gabapentin (NEURONTIN) 300 MG capsule Take 1 capsule (300 mg total) by mouth 2 (two) times daily for 7 days, THEN 1 capsule (300 mg total) 3 (three) times daily for 7 days, THEN 1 capsule (300 mg total) 4 (four) times daily for 15 days. Follow written titration schedule.. 95 capsule 0  . ipratropium (ATROVENT) 0.03 % nasal spray Place 2 sprays into both nostrils 2 (two) times daily.     . isosorbide mononitrate (IMDUR) 30 MG 24 hr tablet TAKE 1 TABLET BY MOUTH DAILY 30 tablet 12  . Magnesium Oxide 500 MG CAPS Take 1 capsule (500 mg total) by mouth daily. 30 capsule 2  . naproxen (NAPROSYN) 500 MG tablet naproxen 500 mg tablet    . nitroGLYCERIN (NITROSTAT) 0.4 MG SL tablet Take 0.4 mg by mouth every 5 (five) minutes x 3 doses as needed for chest pain. As needed for chest pain    . QUEtiapine (SEROQUEL) 50 MG tablet Take 50 mg by mouth at bedtime.    Viviana Simpler ELLIPTA 200-62.5-25 MCG/INH AEPB Inhale 1 puff into the lungs daily.      No current facility-administered  medications for this visit.    Allergies:   Chlorthalidone, Ace inhibitors, Hydrochlorothiazide, Lisinopril, Losartan, Other, and Benazepril-hydrochlorothiazide    Social History:  The patient  reports that he has quit smoking. His smoking use included cigarettes. He has a 50.00 pack-year smoking history. He has never used smokeless tobacco. He reports previous alcohol use. He reports that he does not use drugs.   Family History:  The patient's family history includes Heart attack in his father and mother; Hyperlipidemia in his father and mother; Hypertension in his father and mother.    ROS:  Please see the history of present illness.   Otherwise, review of  systems are positive for none.   All other systems are reviewed and negative.    PHYSICAL EXAM: VS:  BP (!) 108/56 (BP Location: Left Arm, Patient Position: Sitting, Cuff Size: Normal)   Pulse 78   Ht 5\' 7"  (1.702 m)   Wt 277 lb (125.6 kg)   SpO2 97%   BMI 43.38 kg/m  , BMI Body mass index is 43.38 kg/m. GEN: Well nourished, well developed, in no acute distress  HEENT: normal  Neck: no carotid bruits, or masses.  Jugular venous pressure is not well visualized Cardiac: RRR; 1/ 6 systolic murmur at the base, rubs, or gallops, trace bilateral leg edema  Respiratory:  clear to auscultation bilaterally, normal work of breathing GI: soft, nontender, nondistended, + BS MS: no deformity or atrophy  Skin: warm and dry, no rash Neuro:  Strength and sensation are intact Psych: euthymic mood, full affect   EKG:  EKG is ordered today. The ekg ordered today demonstrates normal sinus rhythm with moderate LVH.   Recent Labs: 09/20/2020: BUN 32; Creatinine, Ser 2.28; Magnesium 2.0; Platelets 303; Potassium 3.9; Sodium 132    Lipid Panel    Component Value Date/Time   CHOL 104 04/01/2019 0431   CHOL 189 04/26/2016 0810   CHOL 138 05/31/2014 0416   TRIG 30 04/01/2019 0431   TRIG 75 05/31/2014 0416   HDL 43 04/01/2019 0431   HDL 66  04/26/2016 0810   HDL 40 05/31/2014 0416   CHOLHDL 2.4 04/01/2019 0431   VLDL 6 04/01/2019 0431   VLDL 15 05/31/2014 0416   LDLCALC 55 04/01/2019 0431   LDLCALC 97 04/26/2016 0810   LDLCALC 83 05/31/2014 0416      Wt Readings from Last 3 Encounters:  12/30/20 277 lb (125.6 kg)  12/23/20 270 lb (122.5 kg)  12/15/20 270 lb (122.5 kg)       No flowsheet data found.    ASSESSMENT AND PLAN:  1.  Coronary artery disease involving native coronary arteries without angina: He is overall doing well with no anginal symptoms.  Continue medical therapy.  Continue dual antiplatelet therapy indefinitely as tolerated given multiple RCA stents.   I asked him to resume Plavix which was held recently for an injection.  2.  Chronic systolic heart failure due to ischemic cardiomyopathy: Mildly reduced ejection fraction.  Continue carvedilol.  It seems that he is no longer on losartan likely due to slight worsening of renal function.  3.  Hyperlipidemia: Continue atorvastatin with a target LDL of less than 70.  Lipid profile in April showed an LDL of 55.   4. Essential hypertension: Blood pressure is reasonably controlled on current medications.  5. Chronic kidney disease: Most recent creatinine was slightly above 2.   Disposition:   FU with me in 6 months  Signed,  Kathlyn Sacramento, MD  12/30/2020 10:07 AM    [place]

## 2021-01-04 ENCOUNTER — Encounter: Payer: Self-pay | Admitting: Licensed Clinical Social Worker

## 2021-01-04 ENCOUNTER — Ambulatory Visit (INDEPENDENT_AMBULATORY_CARE_PROVIDER_SITE_OTHER): Payer: HMO | Admitting: Licensed Clinical Social Worker

## 2021-01-04 ENCOUNTER — Other Ambulatory Visit: Payer: Self-pay

## 2021-01-04 DIAGNOSIS — F1011 Alcohol abuse, in remission: Secondary | ICD-10-CM | POA: Diagnosis not present

## 2021-01-04 DIAGNOSIS — F0631 Mood disorder due to known physiological condition with depressive features: Secondary | ICD-10-CM | POA: Diagnosis not present

## 2021-01-04 DIAGNOSIS — Z634 Disappearance and death of family member: Secondary | ICD-10-CM

## 2021-01-04 DIAGNOSIS — Z8659 Personal history of other mental and behavioral disorders: Secondary | ICD-10-CM
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
## 2021-01-06 ENCOUNTER — Encounter: Payer: Self-pay | Admitting: Pain Medicine

## 2021-01-06 ENCOUNTER — Ambulatory Visit (HOSPITAL_BASED_OUTPATIENT_CLINIC_OR_DEPARTMENT_OTHER): Payer: HMO | Admitting: Pain Medicine

## 2021-01-06 ENCOUNTER — Other Ambulatory Visit: Payer: Self-pay

## 2021-01-06 VITALS — BP 111/60 | HR 64 | Temp 97.2°F | Resp 18 | Ht 67.0 in | Wt 278.0 lb

## 2021-01-06 DIAGNOSIS — E662 Morbid (severe) obesity with alveolar hypoventilation: Secondary | ICD-10-CM | POA: Insufficient documentation

## 2021-01-06 DIAGNOSIS — M170 Bilateral primary osteoarthritis of knee: Secondary | ICD-10-CM | POA: Insufficient documentation

## 2021-01-06 DIAGNOSIS — Z7901 Long term (current) use of anticoagulants: Secondary | ICD-10-CM | POA: Insufficient documentation

## 2021-01-06 DIAGNOSIS — G8929 Other chronic pain: Secondary | ICD-10-CM

## 2021-01-06 DIAGNOSIS — M1712 Unilateral primary osteoarthritis, left knee: Secondary | ICD-10-CM | POA: Insufficient documentation

## 2021-01-06 DIAGNOSIS — M25562 Pain in left knee: Secondary | ICD-10-CM | POA: Insufficient documentation

## 2021-01-06 DIAGNOSIS — M25561 Pain in right knee: Secondary | ICD-10-CM | POA: Insufficient documentation

## 2021-01-06 DIAGNOSIS — Z6841 Body Mass Index (BMI) 40.0 and over, adult: Secondary | ICD-10-CM | POA: Insufficient documentation

## 2021-01-06 DIAGNOSIS — M174 Other bilateral secondary osteoarthritis of knee: Secondary | ICD-10-CM | POA: Insufficient documentation

## 2021-01-06 DIAGNOSIS — J449 Chronic obstructive pulmonary disease, unspecified: Secondary | ICD-10-CM | POA: Diagnosis not present

## 2021-01-06 DIAGNOSIS — S72115A Nondisplaced fracture of greater trochanter of left femur, initial encounter for closed fracture: Secondary | ICD-10-CM | POA: Diagnosis not present

## 2021-01-06 MED ORDER — LIDOCAINE HCL (PF) 1 % IJ SOLN
5.0000 mL | Freq: Once | INTRAMUSCULAR | Status: AC
  Administered 2021-01-06: 5 mL

## 2021-01-06 MED ORDER — SODIUM HYALURONATE (VISCOSUP) 20 MG/2ML IX SOSY: 2.0000 mL | PREFILLED_SYRINGE | Freq: Once | INTRA_ARTICULAR | Status: DC

## 2021-01-06 MED ORDER — ROPIVACAINE HCL 2 MG/ML IJ SOLN
INTRAMUSCULAR | Status: AC
  Filled 2021-01-06: qty 10

## 2021-01-06 MED ORDER — ROPIVACAINE HCL 2 MG/ML IJ SOLN
5.0000 mL | Freq: Once | INTRAMUSCULAR | Status: AC
  Administered 2021-01-06: 5 mL via INTRA_ARTICULAR

## 2021-01-06 MED ORDER — LIDOCAINE HCL (PF) 1 % IJ SOLN
INTRAMUSCULAR | Status: AC
  Filled 2021-01-06: qty 5

## 2021-01-06 NOTE — Progress Notes (Signed)
PROVIDER NOTE: Information contained herein reflects review and annotations entered in association with encounter. Interpretation of such information and data should be left to medically-trained personnel. Information provided to patient can be located elsewhere in the medical record under "Patient Instructions". Document created using STT-dictation technology, any transcriptional errors that may result from process are unintentional.    Patient: [name]  Service Category: Procedure  Provider: Gaspar Cola, MD  DOB: 04/14/1935  DOS: 01/06/2021  Location: [facility]  MRN: [id number]  Setting: Ambulatory - outpatient  Referring Provider: Marguerita Merles, MD  Type: Established Patient  Specialty: Interventional Pain Management  PCP: Marguerita Merles, MD   Primary Reason for Visit: Interventional Pain Management Treatment. CC: Knee Pain (bialteral)  Procedure:          Anesthesia, Analgesia, Anxiolysis:  Type: Therapeutic Intra-Articular Hyalgan Knee Injection #2  Region: Lateral infrapatellar Knee Region Level: Knee Joint Laterality: Left knee  Type: Local Anesthesia Indication(s): Analgesia         Local Anesthetic: Lidocaine 1-2% Route: Infiltration ([place]/IM) IV Access: Declined Sedation: Declined   Position: Sitting   Indications: 1. Chronic pain of left knee   2. Arthropathy of left knee   3. Primary osteoarthritis of left knee   4. Chronic knee pain (2ry area of Pain) (Bilateral)   5. Osteoarthritis of knees (Bilateral)   6. Secondary osteoarthritis of knee (Bilateral)   7. Class 3 obesity with alveolar hypoventilation, serious comorbidity, and body mass index (BMI) of 40.0 to 44.9 in adult [facility])    Pain Score: Pre-procedure: 7 /10 Post-procedure: 0-No pain/10   Pre-op H&P Assessment:  Mr. Ono is a 86 y.o. (year old), male patient, seen today for interventional treatment. He  has a past surgical history that includes Total hip arthroplasty;  Coronary/Graft Acute MI Revascularization (N/A, 04/01/2019); CORONARY ANGIOGRAPHY (N/A, 04/01/2019); Cardiac catheterization; and Coronary angioplasty with stent. Mr. Vetsch has a current medication list which includes the following prescription(s): adult aspirin regimen, albuterol, amlodipine, atorvastatin, azelastine, calcium carbonate, carvedilol, vitamin d3, clopidogrel, fluticasone, furosemide, gabapentin, ipratropium, isosorbide mononitrate, magnesium oxide, naproxen, nitroglycerin, quetiapine, and trelegy ellipta, and the following Facility-Administered Medications: sodium hyaluronate. His primarily concern today is the Knee Pain (bialteral)  Initial Vital Signs:  Pulse/HCG Rate: 73  Temp: (!) 97.2 F (36.2 C) Resp: 18 BP: 110/64 SpO2: 95 %  BMI: Estimated body mass index is 43.54 kg/m as calculated from the following:   Height as of this encounter: 5\' 7"  (1.702 m).   Weight as of this encounter: 278 lb (126.1 kg).  Risk Assessment: Allergies: Reviewed. He is allergic to chlorthalidone, ace inhibitors, hydrochlorothiazide, lisinopril, losartan, other, and benazepril-hydrochlorothiazide.  Allergy Precautions: None required Coagulopathies: Reviewed. None identified.  Blood-thinner therapy: None at this time Active Infection(s): Reviewed. None identified. Mr. Pettis is afebrile  Site Confirmation: Mr. Titsworth was asked to confirm the procedure and laterality before marking the site Procedure checklist: Completed Consent: Before the procedure and under the influence of no sedative(s), amnesic(s), or anxiolytics, the patient was informed of the treatment options, risks and possible complications. To fulfill our ethical and legal obligations, as recommended by the American Medical Association's Code of Ethics, I have informed the patient of my clinical impression; the nature and purpose of the treatment or procedure; the risks, benefits, and possible complications of the intervention; the  alternatives, including doing nothing; the risk(s) and benefit(s) of the alternative treatment(s) or procedure(s); and the risk(s) and benefit(s) of doing nothing. The patient  was provided information about the general risks and possible complications associated with the procedure. These may include, but are not limited to: failure to achieve desired goals, infection, bleeding, organ or nerve damage, allergic reactions, paralysis, and death. In addition, the patient was informed of those risks and complications associated to the procedure, such as failure to decrease pain; infection; bleeding; organ or nerve damage with subsequent damage to sensory, motor, and/or autonomic systems, resulting in permanent pain, numbness, and/or weakness of one or several areas of the body; allergic reactions; (i.e.: anaphylactic reaction); and/or death. Furthermore, the patient was informed of those risks and complications associated with the medications. These include, but are not limited to: allergic reactions (i.e.: anaphylactic or anaphylactoid reaction(s)); adrenal axis suppression; blood sugar elevation that in diabetics may result in ketoacidosis or comma; water retention that in patients with history of congestive heart failure may result in shortness of breath, pulmonary edema, and decompensation with resultant heart failure; weight gain; swelling or edema; medication-induced neural toxicity; particulate matter embolism and blood vessel occlusion with resultant organ, and/or nervous system infarction; and/or aseptic necrosis of one or more joints. Finally, the patient was informed that Medicine is not an exact science; therefore, there is also the possibility of unforeseen or unpredictable risks and/or possible complications that may result in a catastrophic outcome. The patient indicated having understood very clearly. We have given the patient no guarantees and we have made no promises. Enough time was given to the  patient to ask questions, all of which were answered to the patient's satisfaction. Mr. Capozzi has indicated that he wanted to continue with the procedure. Attestation: I, the ordering provider, attest that I have discussed with the patient the benefits, risks, side-effects, alternatives, likelihood of achieving goals, and potential problems during recovery for the procedure that I have provided informed consent. Date  Time: 01/06/2021 10:15 AM  Pre-Procedure Preparation:  Monitoring: As per clinic protocol. Respiration, ETCO2, SpO2, BP, heart rate and rhythm monitor placed and checked for adequate function Safety Precautions: Patient was assessed for positional comfort and pressure points before starting the procedure. Time-out: I initiated and conducted the "Time-out" before starting the procedure, as per protocol. The patient was asked to participate by confirming the accuracy of the "Time Out" information. Verification of the correct person, site, and procedure were performed and confirmed by me, the nursing staff, and the patient. "Time-out" conducted as per Joint Commission's Universal Protocol (UP.01.01.01). Time: 1033  Description of Procedure:          Target Area: Knee Joint Approach: Just above the Lateral tibial plateau, lateral to the infrapatellar tendon. Area Prepped: Entire knee area, from the mid-thigh to the mid-shin. DuraPrep (Iodine Povacrylex [0.7% available iodine] and Isopropyl Alcohol, 74% w/w) Safety Precautions: Aspiration looking for blood return was conducted prior to all injections. At no point did we inject any substances, as a needle was being advanced. No attempts were made at seeking any paresthesias. Safe injection practices and needle disposal techniques used. Medications properly checked for expiration dates. SDV (single dose vial) medications used. Description of the Procedure: Protocol guidelines were followed. The patient was placed in position over the  fluoroscopy table. The target area was identified and the area prepped in the usual manner. Skin & deeper tissues infiltrated with local anesthetic. Appropriate amount of time allowed to pass for local anesthetics to take effect. The procedure needles were then advanced to the target area. Proper needle placement secured. Negative aspiration confirmed. Solution injected in intermittent  fashion, asking for systemic symptoms every 0.5cc of injectate. The needles were then removed and the area cleansed, making sure to leave some of the prepping solution back to take advantage of its long term bactericidal properties. Vitals:   01/06/21 1013 01/06/21 1036  BP: 110/64 111/60  Pulse: 73 64  Resp: 18 18  Temp: (!) 97.2 F (36.2 C)   SpO2: 95% 96%  Weight: 278 lb (126.1 kg)   Height: 5\' 7"  (1.702 m)     Start Time: 1033 hrs. End Time: 1035 hrs. Materials:  Needle(s) Type: Regular needle Gauge: 25G Length: 1.5-in Medication(s): Please see orders for medications and dosing details.  Imaging Guidance:          Type of Imaging Technique: None used Indication(s): N/A Exposure Time: No patient exposure Contrast: None used. Fluoroscopic Guidance: N/A Ultrasound Guidance: N/A Interpretation: N/A  Antibiotic Prophylaxis:   Anti-infectives (From admission, onward)   None     Indication(s): None identified  Post-operative Assessment:  Post-procedure Vital Signs:  Pulse/HCG Rate: 64  Temp: (!) 97.2 F (36.2 C) Resp: 18 BP: 111/60 SpO2: 96 %  EBL: None  Complications: No immediate post-treatment complications observed by team, or reported by patient.  Note: The patient tolerated the entire procedure well. A repeat set of vitals were taken after the procedure and the patient was kept under observation following institutional policy, for this type of procedure. Post-procedural neurological assessment was performed, showing return to baseline, prior to discharge. The patient was provided  with post-procedure discharge instructions, including a section on how to identify potential problems. Should any problems arise concerning this procedure, the patient was given instructions to immediately contact us, at any time, without hesitation. In any case, we plan to contact the patient by telephone for a follow-up status report regarding this interventional procedure.  Comments:  No additional relevant information.  Plan of Care  Orders:  Orders Placed This Encounter  Procedures  . KNEE INJECTION    Hyalgan knee injection to be done by MD.    Scheduling Instructions:     Procedure: Intra-articular Hyalgan Knee injection #2     Side(s): Left Knee     Sedation: None     Timeframe: Today    Order Specific Question:   Where will this procedure be performed?    Answer:   [facility]  . KNEE INJECTION    Hyalgan knee injection. Please order Hyalgan.    Standing Status:   Future    Standing Expiration Date:   02/06/2021    Scheduling Instructions:     Procedure: Intra-articular Hyalgan Knee injection #3     Side: Left-sided     Sedation: None     Timeframe: in two (2) weeks    Order Specific Question:   Where will this procedure be performed?    Answer:   [facility]  . Informed Consent Details: Physician/Practitioner Attestation; Transcribe to consent form and obtain patient signature    Note: Always confirm laterality of pain with Mr. Stuckey, before procedure. Transcribe to consent form and obtain patient signature.    Order Specific Question:   Physician/Practitioner attestation of informed consent for procedure/surgical case    Answer:   I, the physician/practitioner, attest that I have discussed with the patient the benefits, risks, side effects, alternatives, likelihood of achieving goals and potential problems during recovery for the procedure that I have provided informed consent.    Order Specific Question:   Procedure  Answer:   Therapeutic, left sided,  intra-articular Hyalgan knee injection    Order Specific Question:   Physician/Practitioner performing the procedure    Answer:   Darwin Guastella A. Dossie Arbour, MD    Order Specific Question:   Indication/Reason    Answer:   Chronic left knee pain secondary to primary osteoarthritis of the knee  . Care order/instruction: Please confirm that the patient has stopped the Plavix (Clopidogrel) x 7-10 days prior to procedure or surgery.    Please confirm that the patient has stopped the Plavix (Clopidogrel) x 7-10 days prior to procedure or surgery.    Standing Status:   Standing    Number of Occurrences:   1  . Provide equipment / supplies at bedside    "Block Tray" (Disposable  single use) Needle type: SpinalRegular Amount/quantity: 1 Size: Short(1.5-inch) Gauge: 25G    Standing Status:   Standing    Number of Occurrences:   1    Order Specific Question:   Specify    Answer:   Block Tray  . Blood Thinner Instructions to Nursing    Always make sure patient has clearance from prescribing physician to stop blood thinners for interventional therapies. If the patient requires a Lovenox-bridge therapy, make sure arrangements are made to institute it with the assistance of the PCP.    Scheduling Instructions:     Have Mr. Orth stop the Plavix (Clopidogrel) x 7-10 days prior to procedure or surgery.  . Bleeding precautions    Standing Status:   Standing    Number of Occurrences:   1   Chronic Opioid Analgesic:  Tramadol 50 mg, 1 tab p.o. 4 times daily (200 mg/day of tramadol) (20 MME/day) MME/day: 20 mg/day   Medications ordered for procedure: Meds ordered this encounter  Medications  . lidocaine (PF) (XYLOCAINE) 1 % injection 5 mL  . ropivacaine (PF) 2 mg/mL (0.2%) (NAROPIN) injection 5 mL  . Sodium Hyaluronate SOSY 2 mL   Medications administered: We administered lidocaine (PF) and ropivacaine (PF) 2 mg/mL (0.2%).  See the medical record for exact dosing, route, and time of  administration.  Follow-up plan:   Return in about 2 weeks (around 01/20/2021) for Procedure (no sedation): (L) Hyalgan #3, (Blood Thinner Protocol).       Interventional Therapies  Risk  Complexity Considerations:   Class III morbid obesity (BMI 42.29 kg/m) Difficulty breathing and tolerating prone position NOTE: PLAVIX Anticoagulation (Stop: 7-10 days  Restart: 2 hours) Chronic CHF  CAD with history of MI  History of alcohol abuse  Chronic kidney disease stage III  COPD  OSA  Carotid artery disease  Abnormal heart rhythm with PVCs during procedure    Planned  Pending:   Possible series of 5 left knee Hyalgan injections    Under consideration:   Pending to order bilateral shoulder x-rays.  Possible need for neurosurgical evaluation.  We will also consider nerve conduction testing of the lower extremities. Diagnostic left L2-3 LESI #1  Diagnostic left L3-4 LESI #1  Diagnostic left L4-5 LESI #1  Diagnostic bilateral L2 TFESI #1  Diagnostic bilateral L3 TFESI #1  Diagnostic bilateral L4 TFESI #1  Diagnostic bilateral lumbar facet block #1  Diagnostic bilateral femoral nerve and obturator nerve block #1  Diagnostic bilateral IA knee joint injection (steroid) #1  Possible series of bilateral IA Hyalgan knee injections  Diagnostic bilateral genicular nerve block #1    Completed:   Diagnostic left L2-3 LESI x1 (11/16/2020) (100/100/100 x2 days/<50) Diagnostic/therapeutic left IA  hip injection x2 (11/30/2020) (12/23/2020) Diagnostic/therapeutic left subgluteus maximus bursa injection x1 (11/30/2020)  Therapeutic left IA Hyalgan knee injection x1 (12/23/2020)   Palliative options:   Therapeutic left L2-3 LESI #2      Recent Visits Date Type Provider Dept  12/23/20 Procedure visit Milinda Pointer, MD [facility]  12/15/20 Office Visit Milinda Pointer, MD [facility]  11/30/20 Procedure visit Milinda Pointer, MD [facility]  11/29/20 Office  Visit Milinda Pointer, MD [facility]  11/16/20 Procedure visit Milinda Pointer, MD [facility]  10/25/20 Office Visit Milinda Pointer, MD [facility]  Showing recent visits within past 90 days and meeting all other requirements Today's Visits Date Type Provider Dept  01/06/21 Procedure visit Milinda Pointer, MD [facility]  Showing today's visits and meeting all other requirements Future Appointments Date Type Provider Dept  01/20/21 Appointment Milinda Pointer, MD [facility]  Showing future appointments within next 90 days and meeting all other requirements  Disposition: Discharge home  Discharge (Date  Time): 01/06/2021; 1038 hrs.   Primary Care Physician: Marguerita Merles, MD Location: [facility] Note by: Gaspar Cola, MD Date: 01/06/2021; Time: 10:42 AM  Disclaimer:  Medicine is not an Chief Strategy Officer. The only guarantee in medicine is that nothing is guaranteed. It is important to note that the decision to proceed with this intervention was based on the information collected from the patient. The Data and conclusions were drawn from the patient's questionnaire, the interview, and the physical examination. Because the information was provided in large part by the patient, it cannot be guaranteed that it has not been purposely or unconsciously manipulated. Every effort has been made to obtain as much relevant data as possible for this evaluation. It is important to note that the conclusions that lead to this procedure are derived in large part from the available data. Always take into account that the treatment will also be dependent on availability of resources and existing treatment guidelines, considered by other Pain Management Practitioners as being common knowledge and practice, at the time of the intervention. For Medico-Legal purposes, it is also important to point out that variation in  procedural techniques and pharmacological choices are the acceptable norm. The indications, contraindications, technique, and results of the above procedure should only be interpreted and judged by a Board-Certified Interventional Pain Specialist with extensive familiarity and expertise in the same exact procedure and technique.

## 2021-01-06 NOTE — Patient Instructions (Signed)
____________________________________________________________________________________________  Post-Procedure Discharge Instructions  Instructions:  Apply ice:   Purpose: This will minimize any swelling and discomfort after procedure.   When: Day of procedure, as soon as you get home.  How: Fill a plastic sandwich bag with crushed ice. Cover it with a small towel and apply to injection site.  How long: (15 min on, 15 min off) Apply for 15 minutes then remove x 15 minutes.  Repeat sequence on day of procedure, until you go to bed.  Apply heat:   Purpose: To treat any soreness and discomfort from the procedure.  When: Starting the next day after the procedure.  How: Apply heat to procedure site starting the day following the procedure.  How long: May continue to repeat daily, until discomfort goes away.  Food intake: Start with clear liquids (like water) and advance to regular food, as tolerated.   Physical activities: Keep activities to a minimum for the first 8 hours after the procedure. After that, then as tolerated.  Driving: If you have received any sedation, be responsible and do not drive. You are not allowed to drive for 24 hours after having sedation.  Blood thinner: (Applies only to those taking blood thinners) You may restart your blood thinner 6 hours after your procedure.  Insulin: (Applies only to Diabetic patients taking insulin) As soon as you can eat, you may resume your normal dosing schedule.  Infection prevention: Keep procedure site clean and dry. Shower daily and clean area with soap and water.  Post-procedure Pain Diary: Extremely important that this be done correctly and accurately. Recorded information will be used to determine the next step in treatment. For the purpose of accuracy, follow these rules:  Evaluate only the area treated. Do not report or include pain from an untreated area. For the purpose of this evaluation, ignore all other areas of pain,  except for the treated area.  After your procedure, avoid taking a long nap and attempting to complete the pain diary after you wake up. Instead, set your alarm clock to go off every hour, on the hour, for the initial 8 hours after the procedure. Document the duration of the numbing medicine, and the relief you are getting from it.  Do not go to sleep and attempt to complete it later. It will not be accurate. If you received sedation, it is likely that you were given a medication that may cause amnesia. Because of this, completing the diary at a later time may cause the information to be inaccurate. This information is needed to plan your care.  Follow-up appointment: Keep your post-procedure follow-up evaluation appointment after the procedure (usually 2 weeks for most procedures, 6 weeks for radiofrequencies). DO NOT FORGET to bring you pain diary with you.   Expect: (What should I expect to see with my procedure?)  From numbing medicine (AKA: Local Anesthetics): Numbness or decrease in pain. You may also experience some weakness, which if present, could last for the duration of the local anesthetic.  Onset: Full effect within 15 minutes of injected.  Duration: It will depend on the type of local anesthetic used. On the average, 1 to 8 hours.   From steroids (Applies only if steroids were used): Decrease in swelling or inflammation. Once inflammation is improved, relief of the pain will follow.  Onset of benefits: Depends on the amount of swelling present. The more swelling, the longer it will take for the benefits to be seen. In some cases, up to 10 days.    Duration: Steroids will stay in the system x 2 weeks. Duration of benefits will depend on multiple posibilities including persistent irritating factors.  Side-effects: If present, they may typically last 2 weeks (the duration of the steroids).  Frequent: Cramps (if they occur, drink Gatorade and take over-the-counter Magnesium 450-500 mg  once to twice a day); water retention with temporary weight gain; increases in blood sugar; decreased immune system response; increased appetite.  Occasional: Facial flushing (red, warm cheeks); mood swings; menstrual changes.  Uncommon: Long-term decrease or suppression of natural hormones; bone thinning. (These are more common with higher doses or more frequent use. This is why we prefer that our patients avoid having any injection therapies in other practices.)   Very Rare: Severe mood changes; psychosis; aseptic necrosis.  From procedure: Some discomfort is to be expected once the numbing medicine wears off. This should be minimal if ice and heat are applied as instructed.  Call if: (When should I call?)  You experience numbness and weakness that gets worse with time, as opposed to wearing off.  New onset bowel or bladder incontinence. (Applies only to procedures done in the spine)  Emergency Numbers:  Durning business hours (Monday - Thursday, 8:00 AM - 4:00 PM) (Friday, 9:00 AM - 12:00 Noon): [phone]  After hours: [phone]  NOTE: If you are having a problem and are unable connect with, or to talk to a provider, then go to your nearest urgent care or emergency department. If the problem is serious and urgent, please call 911. ____________________________________________________________________________________________   ____________________________________________________________________________________________  Preparing for your procedure (without sedation)  Procedure appointments are limited to planned procedures: . No Prescription Refills. . No disability issues will be discussed. . No medication changes will be discussed.  Instructions: . Oral Intake: Do not eat or drink anything for at least 6 hours prior to your procedure. (Exception: Blood Pressure Medication. See below.) . Transportation: Unless otherwise stated by your physician, you may drive yourself  after the procedure. . Blood Pressure Medicine: Do not forget to take your blood pressure medicine with a sip of water the morning of the procedure. If your Diastolic (lower reading)is above 100 mmHg, elective cases will be cancelled/rescheduled. . Blood thinners: These will need to be stopped for procedures. Notify our staff if you are taking any blood thinners. Depending on which one you take, there will be specific instructions on how and when to stop it. . Diabetics on insulin: Notify the staff so that you can be scheduled 1st case in the morning. If your diabetes requires high dose insulin, take only  of your normal insulin dose the morning of the procedure and notify the staff that you have done so. . Preventing infections: Shower with an antibacterial soap the morning of your procedure.  . Build-up your immune system: Take 1000 mg of Vitamin C with every meal (3 times a day) the day prior to your procedure. . Antibiotics: Inform the staff if you have a condition or reason that requires you to take antibiotics before dental procedures. . Pregnancy: If you are pregnant, call and cancel the procedure. . Sickness: If you have a cold, fever, or any active infections, call and cancel the procedure. . Arrival: You must be in the facility at least 30 minutes prior to your scheduled procedure. . Children: Do not bring any children with you. . Dress appropriately: Bring dark clothing that you would not mind if they get stained. . Valuables: Do not bring any jewelry   or valuables.  Reasons to call and reschedule or cancel your procedure: (Following these recommendations will minimize the risk of a serious complication.) . Surgeries: Avoid having procedures within 2 weeks of any surgery. (Avoid for 2 weeks before or after any surgery). . Flu Shots: Avoid having procedures within 2 weeks of a flu shots or . (Avoid for 2 weeks before or after immunizations). . Barium: Avoid having a procedure within 7-10  days after having had a radiological study involving the use of radiological contrast. (Myelograms, Barium swallow or enema study). . Heart attacks: Avoid any elective procedures or surgeries for the initial 6 months after a "Myocardial Infarction" (Heart Attack). . Blood thinners: It is imperative that you stop these medications before procedures. Let us know if you if you take any blood thinner.  . Infection: Avoid procedures during or within two weeks of an infection (including chest colds or gastrointestinal problems). Symptoms associated with infections include: Localized redness, fever, chills, night sweats or profuse sweating, burning sensation when voiding, cough, congestion, stuffiness, runny nose, sore throat, diarrhea, nausea, vomiting, cold or Flu symptoms, recent or current infections. It is specially important if the infection is over the area that we intend to treat. . Heart and lung problems: Symptoms that may suggest an active cardiopulmonary problem include: cough, chest pain, breathing difficulties or shortness of breath, dizziness, ankle swelling, uncontrolled high or unusually low blood pressure, and/or palpitations. If you are experiencing any of these symptoms, cancel your procedure and contact your primary care physician for an evaluation.  Remember:  Regular Business hours are:  Monday to Thursday 8:00 AM to 4:00 PM  Provider's Schedule: Keimon Basaldua, MD:  Procedure days: Tuesday and Thursday 7:30 AM to 4:00 PM  Bilal Lateef, MD:  Procedure days: Monday and Wednesday 7:30 AM to 4:00 PM ____________________________________________________________________________________________    

## 2021-01-06 NOTE — Progress Notes (Signed)
Safety precautions to be maintained throughout the outpatient stay will include: orient to surroundings, keep bed in low position, maintain call bell within reach at all times, provide assistance with transfer out of bed and ambulation.  

## 2021-01-07 ENCOUNTER — Telehealth: Payer: Self-pay

## 2021-01-07 NOTE — Telephone Encounter (Signed)
Post procedure phone call.  LM 

## 2021-01-08 ENCOUNTER — Inpatient Hospital Stay
Admission: EM | Admit: 2021-01-08 | Discharge: 2021-01-13 | DRG: 536 | Disposition: A | Discharge status: Skilled Nursing Facility | Payer: HMO | Attending: Internal Medicine | Admitting: Internal Medicine

## 2021-01-08 ENCOUNTER — Inpatient Hospital Stay: Payer: HMO

## 2021-01-08 ENCOUNTER — Encounter: Payer: Self-pay | Admitting: Emergency Medicine

## 2021-01-08 ENCOUNTER — Other Ambulatory Visit: Payer: Self-pay

## 2021-01-08 ENCOUNTER — Emergency Department: Payer: HMO

## 2021-01-08 DIAGNOSIS — M25552 Pain in left hip: Secondary | ICD-10-CM | POA: Diagnosis not present

## 2021-01-08 DIAGNOSIS — I255 Ischemic cardiomyopathy: Secondary | ICD-10-CM | POA: Diagnosis present

## 2021-01-08 DIAGNOSIS — J449 Chronic obstructive pulmonary disease, unspecified: Secondary | ICD-10-CM | POA: Diagnosis present

## 2021-01-08 DIAGNOSIS — Z9119 Patient's noncompliance with other medical treatment and regimen: Secondary | ICD-10-CM | POA: Diagnosis not present

## 2021-01-08 DIAGNOSIS — I130 Hypertensive heart and chronic kidney disease with heart failure and stage 1 through stage 4 chronic kidney disease, or unspecified chronic kidney disease: Secondary | ICD-10-CM | POA: Diagnosis present

## 2021-01-08 DIAGNOSIS — I5022 Chronic systolic (congestive) heart failure: Secondary | ICD-10-CM | POA: Diagnosis present

## 2021-01-08 DIAGNOSIS — G8929 Other chronic pain: Secondary | ICD-10-CM | POA: Diagnosis not present

## 2021-01-08 DIAGNOSIS — Z79899 Other long term (current) drug therapy: Secondary | ICD-10-CM

## 2021-01-08 DIAGNOSIS — Z96641 Presence of right artificial hip joint: Secondary | ICD-10-CM | POA: Diagnosis present

## 2021-01-08 DIAGNOSIS — M25551 Pain in right hip: Secondary | ICD-10-CM | POA: Diagnosis present

## 2021-01-08 DIAGNOSIS — I739 Peripheral vascular disease, unspecified: Secondary | ICD-10-CM | POA: Diagnosis present

## 2021-01-08 DIAGNOSIS — G894 Chronic pain syndrome: Secondary | ICD-10-CM | POA: Diagnosis present

## 2021-01-08 DIAGNOSIS — M170 Bilateral primary osteoarthritis of knee: Secondary | ICD-10-CM | POA: Diagnosis present

## 2021-01-08 DIAGNOSIS — I2510 Atherosclerotic heart disease of native coronary artery without angina pectoris: Secondary | ICD-10-CM | POA: Diagnosis present

## 2021-01-08 DIAGNOSIS — Z8546 Personal history of malignant neoplasm of prostate: Secondary | ICD-10-CM

## 2021-01-08 DIAGNOSIS — D631 Anemia in chronic kidney disease: Secondary | ICD-10-CM | POA: Diagnosis present

## 2021-01-08 DIAGNOSIS — S92001A Unspecified fracture of right calcaneus, initial encounter for closed fracture: Secondary | ICD-10-CM | POA: Diagnosis present

## 2021-01-08 DIAGNOSIS — N184 Chronic kidney disease, stage 4 (severe): Secondary | ICD-10-CM | POA: Diagnosis present

## 2021-01-08 DIAGNOSIS — S72009A Fracture of unspecified part of neck of unspecified femur, initial encounter for closed fracture: Secondary | ICD-10-CM

## 2021-01-08 DIAGNOSIS — G4733 Obstructive sleep apnea (adult) (pediatric): Secondary | ICD-10-CM | POA: Diagnosis present

## 2021-01-08 DIAGNOSIS — W1830XA Fall on same level, unspecified, initial encounter: Secondary | ICD-10-CM | POA: Diagnosis present

## 2021-01-08 DIAGNOSIS — M25571 Pain in right ankle and joints of right foot: Secondary | ICD-10-CM | POA: Diagnosis not present

## 2021-01-08 DIAGNOSIS — F0631 Mood disorder due to known physiological condition with depressive features: Secondary | ICD-10-CM | POA: Diagnosis not present

## 2021-01-08 DIAGNOSIS — Z955 Presence of coronary angioplasty implant and graft: Secondary | ICD-10-CM | POA: Diagnosis not present

## 2021-01-08 DIAGNOSIS — Z20822 Contact with and (suspected) exposure to covid-19: Secondary | ICD-10-CM | POA: Diagnosis present

## 2021-01-08 DIAGNOSIS — F319 Bipolar disorder, unspecified: Secondary | ICD-10-CM | POA: Diagnosis present

## 2021-01-08 DIAGNOSIS — I252 Old myocardial infarction: Secondary | ICD-10-CM

## 2021-01-08 DIAGNOSIS — Z83438 Family history of other disorder of lipoprotein metabolism and other lipidemia: Secondary | ICD-10-CM

## 2021-01-08 DIAGNOSIS — N1832 Chronic kidney disease, stage 3b: Secondary | ICD-10-CM | POA: Diagnosis not present

## 2021-01-08 DIAGNOSIS — Z8249 Family history of ischemic heart disease and other diseases of the circulatory system: Secondary | ICD-10-CM

## 2021-01-08 DIAGNOSIS — I10 Essential (primary) hypertension: Secondary | ICD-10-CM | POA: Diagnosis not present

## 2021-01-08 DIAGNOSIS — S72115A Nondisplaced fracture of greater trochanter of left femur, initial encounter for closed fracture: Secondary | ICD-10-CM | POA: Diagnosis present

## 2021-01-08 DIAGNOSIS — I5042 Chronic combined systolic (congestive) and diastolic (congestive) heart failure: Secondary | ICD-10-CM | POA: Diagnosis present

## 2021-01-08 DIAGNOSIS — N179 Acute kidney failure, unspecified: Secondary | ICD-10-CM | POA: Diagnosis present

## 2021-01-08 DIAGNOSIS — Z6841 Body Mass Index (BMI) 40.0 and over, adult: Secondary | ICD-10-CM

## 2021-01-08 DIAGNOSIS — E785 Hyperlipidemia, unspecified: Secondary | ICD-10-CM | POA: Diagnosis present

## 2021-01-08 DIAGNOSIS — N189 Chronic kidney disease, unspecified: Secondary | ICD-10-CM | POA: Diagnosis not present

## 2021-01-08 DIAGNOSIS — Z7982 Long term (current) use of aspirin: Secondary | ICD-10-CM

## 2021-01-08 DIAGNOSIS — Z7902 Long term (current) use of antithrombotics/antiplatelets: Secondary | ICD-10-CM

## 2021-01-08 DIAGNOSIS — Z888 Allergy status to other drugs, medicaments and biological substances status: Secondary | ICD-10-CM

## 2021-01-08 DIAGNOSIS — Z87891 Personal history of nicotine dependence: Secondary | ICD-10-CM

## 2021-01-08 DIAGNOSIS — Z7951 Long term (current) use of inhaled steroids: Secondary | ICD-10-CM

## 2021-01-08 LAB — BASIC METABOLIC PANEL
Anion gap: 14 (ref 5–15)
BUN: 33 mg/dL — ABNORMAL HIGH (ref 8–23)
CO2: 25 mmol/L (ref 22–32)
Calcium: 8.6 mg/dL — ABNORMAL LOW (ref 8.9–10.3)
Chloride: 101 mmol/L (ref 98–111)
Creatinine, Ser: 2.41 mg/dL — ABNORMAL HIGH (ref 0.61–1.24)
GFR, Estimated: 26 mL/min — ABNORMAL LOW (ref >60)
Glucose, Bld: 106 mg/dL — ABNORMAL HIGH (ref 70–99)
Potassium: 3.7 mmol/L (ref 3.5–5.1)
Sodium: 140 mmol/L (ref 135–145)

## 2021-01-08 LAB — CBC WITH DIFFERENTIAL/PLATELET
Abs Immature Granulocytes: 0.06 10*3/uL (ref 0.00–0.07)
Basophils Absolute: 0 10*3/uL (ref 0.0–0.1)
Basophils Relative: 0 %
Eosinophils Absolute: 0.1 10*3/uL (ref 0.0–0.5)
Eosinophils Relative: 1 %
HCT: 32.8 % — ABNORMAL LOW (ref 39.0–52.0)
Hemoglobin: 10.7 g/dL — ABNORMAL LOW (ref 13.0–17.0)
Immature Granulocytes: 1 %
Lymphocytes Relative: 18 %
Lymphs Abs: 1.5 10*3/uL (ref 0.7–4.0)
MCH: 28.2 pg (ref 26.0–34.0)
MCHC: 32.6 g/dL (ref 30.0–36.0)
MCV: 86.3 fL (ref 80.0–100.0)
Monocytes Absolute: 0.9 10*3/uL (ref 0.1–1.0)
Monocytes Relative: 11 %
Neutro Abs: 5.9 10*3/uL (ref 1.7–7.7)
Neutrophils Relative %: 69 %
Platelets: 209 10*3/uL (ref 150–400)
RBC: 3.8 MIL/uL — ABNORMAL LOW (ref 4.22–5.81)
RDW: 15.8 % — ABNORMAL HIGH (ref 11.5–15.5)
WBC: 8.6 10*3/uL (ref 4.0–10.5)
nRBC: 0 % (ref 0.0–0.2)

## 2021-01-08 LAB — SARS CORONAVIRUS 2 (TAT 6-24 HRS): SARS Coronavirus 2: NEGATIVE

## 2021-01-08 MED ORDER — HEPARIN SODIUM (PORCINE) 5000 UNIT/ML IJ SOLN
5000.0000 [IU] | Freq: Three times a day (TID) | INTRAMUSCULAR | Status: DC
  Administered 2021-01-09 – 2021-01-13 (×14): 5000 [IU] via SUBCUTANEOUS
  Filled 2021-01-08 (×14): qty 1

## 2021-01-08 MED ORDER — HYDROCODONE-ACETAMINOPHEN 5-325 MG PO TABS
1.0000 | ORAL_TABLET | Freq: Four times a day (QID) | ORAL | Status: DC | PRN
  Administered 2021-01-09 – 2021-01-11 (×5): 2 via ORAL
  Administered 2021-01-11 – 2021-01-12 (×2): 1 via ORAL
  Filled 2021-01-08 (×5): qty 2
  Filled 2021-01-08: qty 1
  Filled 2021-01-08: qty 2

## 2021-01-08 MED ORDER — ASPIRIN EC 81 MG PO TBEC
81.0000 mg | DELAYED_RELEASE_TABLET | Freq: Every day | ORAL | Status: DC
  Administered 2021-01-09 – 2021-01-13 (×5): 81 mg via ORAL
  Filled 2021-01-08 (×5): qty 1

## 2021-01-08 MED ORDER — ISOSORBIDE MONONITRATE ER 30 MG PO TB24
30.0000 mg | ORAL_TABLET | Freq: Every day | ORAL | Status: DC
  Administered 2021-01-09 – 2021-01-13 (×5): 30 mg via ORAL
  Filled 2021-01-08 (×5): qty 1

## 2021-01-08 MED ORDER — ALBUTEROL SULFATE HFA 108 (90 BASE) MCG/ACT IN AERS
2.0000 | INHALATION_SPRAY | RESPIRATORY_TRACT | Status: DC | PRN
  Administered 2021-01-09 – 2021-01-13 (×3): 2 via RESPIRATORY_TRACT
  Filled 2021-01-08 (×2): qty 6.7

## 2021-01-08 MED ORDER — CARVEDILOL 3.125 MG PO TABS
6.2500 mg | ORAL_TABLET | Freq: Two times a day (BID) | ORAL | Status: DC
  Administered 2021-01-08 – 2021-01-13 (×10): 6.25 mg via ORAL
  Filled 2021-01-08 (×4): qty 2
  Filled 2021-01-08 (×2): qty 1
  Filled 2021-01-08 (×2): qty 2
  Filled 2021-01-08: qty 1
  Filled 2021-01-08: qty 2

## 2021-01-08 MED ORDER — MORPHINE SULFATE (PF) 2 MG/ML IV SOLN
0.5000 mg | INTRAVENOUS | Status: DC | PRN
  Administered 2021-01-13: 0.5 mg via INTRAVENOUS
  Filled 2021-01-08: qty 1

## 2021-01-08 MED ORDER — QUETIAPINE FUMARATE 25 MG PO TABS
50.0000 mg | ORAL_TABLET | Freq: Every day | ORAL | Status: DC
  Administered 2021-01-09 – 2021-01-12 (×4): 50 mg via ORAL
  Filled 2021-01-08 (×4): qty 2

## 2021-01-08 MED ORDER — CLOPIDOGREL BISULFATE 75 MG PO TABS
75.0000 mg | ORAL_TABLET | Freq: Every day | ORAL | Status: DC
  Administered 2021-01-09 – 2021-01-13 (×5): 75 mg via ORAL
  Filled 2021-01-08 (×5): qty 1

## 2021-01-08 MED ORDER — AMLODIPINE BESYLATE 5 MG PO TABS
5.0000 mg | ORAL_TABLET | Freq: Every day | ORAL | Status: DC
  Administered 2021-01-09 – 2021-01-13 (×5): 5 mg via ORAL
  Filled 2021-01-08 (×5): qty 1

## 2021-01-08 MED ORDER — FUROSEMIDE 40 MG PO TABS
40.0000 mg | ORAL_TABLET | Freq: Every day | ORAL | Status: DC
  Administered 2021-01-09 – 2021-01-13 (×5): 40 mg via ORAL
  Filled 2021-01-08 (×5): qty 1

## 2021-01-08 MED ORDER — ATORVASTATIN CALCIUM 20 MG PO TABS
40.0000 mg | ORAL_TABLET | Freq: Every day | ORAL | Status: DC
  Administered 2021-01-09 – 2021-01-13 (×5): 40 mg via ORAL
  Filled 2021-01-08 (×5): qty 2

## 2021-01-08 NOTE — ED Notes (Signed)
This nurse unable to draw blood on pt. Will ask other nurse to attempt blood draw.

## 2021-01-08 NOTE — Progress Notes (Signed)
20 g iv started in rt ac labs drawn and sent.

## 2021-01-08 NOTE — ED Triage Notes (Signed)
Patient brought in by ems from home. Patient states that he had a mechanical fall this morning. Patient with complaint of pain to his left hip but states that it is chronic. Patient with complaint of pain to his right hip that is new since the fall. Patient denies hitting his head.

## 2021-01-08 NOTE — ED Notes (Signed)
Pt to xray

## 2021-01-08 NOTE — ED Notes (Signed)
Pt in MRI.

## 2021-01-08 NOTE — ED Provider Notes (Signed)
-----------------------------------------   10:40 PM on 01/08/2021 -----------------------------------------  I took over care of this patient from Dr. Jari Pigg.  The patient presented after a fall with bilateral ankle and left hip pain.  X-ray and CT of the left hip were negative for acute fracture.  An MRI was pending at the time of signout.  MRI reveals findings compatible with possible nondisplaced trochanteric fracture.  I consulted Dr. Posey Pronto from orthopedics who advised that based on the MRI findings and clinical presentation, this is likely nonoperative.  He recommends admission to the hospitalist, work-up with some additional labs, he will consult in the morning.  I then discussed the case with the hospitalist for admission.  I updated the patient on the plan and he expressed agreement.   Arta Silence, MD 01/08/21 2242

## 2021-01-08 NOTE — ED Provider Notes (Signed)
[facility] Emergency Department Provider Note   ____________________________________________   Event Date/Time   First MD Initiated Contact with Patient 01/08/21 0815     (approximate)  I have reviewed the triage vital signs and the nursing notes.   HISTORY  Chief Complaint Fall    HPI [name] is a 86 y.o. male patient arrived via EMS states he is unable to stand.  Patient had a fall yesterday and another fall this morning.  Patient uses a walker.  Patient states bilateral hip pain.  Patient states unable to have a hip replacement to the left hip secondary to age and weight.  Patient had total hip replacement to the right.  Patient state his ankles give out  when he tries to stand.  Patient required assisted living evaluation secondary to living alone.         Past Medical History:  Diagnosis Date  . Alcoholism ([place])   . Asthma   . Bipolar affective disorder ([place])   . Chronic combined systolic and diastolic CHF (congestive heart failure) ([place])    a. 08/2016 Echo: EF 40-45%, mild AS, mild to mod MR, mildly dil LA/RA, mild-mod TR; b. 07/2017 Echo: EF 40-45%, mod LVH, Gr1 DD, mild to mod AS, mildly dil LA, nl RV fxn; c. 03/2019 Echo: EF 45-50%, AS (not severe). Mod dil PA.  . CKD (chronic kidney disease), stage III ([place])   . COPD (chronic obstructive pulmonary disease) ([place])   . Coronary artery disease    a. 01/2004 s/p PCI and Taxus DES to dRCA (3.5 x 12 mm); b. 07/2017 Lexiscan MV: no ischemia. Sm area of apicl thinning, likely attenuation. EF 33% (GI uptake noted)-->Low risk; c. 03/2019 Inf STEMI/PCI: LM nl, LAD min irregs, D1 20ost, RI 20ost, LCX nl, RCA 90p/45m (4.0x26 Resolute Onyx DES), 59m (4.0x15 Resolute Onyx DES), 10d ISR.  Marland Kitchen Degenerative joint disease    knees and hip  . Dyspnea    on exertion  . Essential hypertension   . Hyperlipidemia   . Hypertension    controlled on meds  . Ischemic cardiomyopathy    a. 08/2016 Echo: EF 40-45%; b.  07/2017 Echo: EF 40-45%; c. 03/2019 Echo: EF 45-50%.  . OSA (obstructive sleep apnea)   . Prostate CA [facility])    prostate ca dx 20 yrs ago  . PVD (peripheral vascular disease) ([place])   . Tobacco abuse     Patient Active Problem List   Diagnosis Date Noted  . Primary osteoarthritis of left knee 01/06/2021  . Arthropathy of left knee 01/06/2021  . Chronic pain of left knee 01/06/2021  . Class 3 obesity with alveolar hypoventilation and body mass index (BMI) of 40.0 to 44.9 in adult ([place]) 12/23/2020  . Hip joint effusion (Left) 12/23/2020  . History of alcohol abuse 11/29/2020  . Elevated rheumatoid factor 11/29/2020  . Polyarthritis with positive rheumatoid factor ([place]) 11/17/2020  . Elevated uric acid in blood 11/17/2020  . Stage 3b chronic kidney disease ([place]) 10/26/2020  . Proteinuria, unspecified 10/26/2020  . Elevated BUN 10/26/2020  . Elevated serum creatinine 10/26/2020  . Hypochloremia 10/26/2020  . Hypocalcemia 10/26/2020  . Hypoalbuminemia 10/26/2020  . Elevated alkaline phosphatase level 10/26/2020  . Elevated hemoglobin A1c 10/26/2020  . Elevated random blood glucose level 10/26/2020  . Anemia in stage 3b chronic kidney disease ([place]) 10/26/2020  .  Klippel-Feil deformity at the C5-C7 levels 10/26/2020  . Carotid artery calcification, bilateral 10/26/2020  . History of 2.0  cm thyroid mass (Left lobe) 10/26/2020    Class: History of  . Bilateral renal cysts 10/26/2020  . Lumbosacral spondylosis 10/26/2020  . Osteoarthritis of facet joint of lumbar spine 10/26/2020  . DDD (degenerative disc disease), lumbosacral 10/26/2020  . History of total hip replacement (Right) 10/26/2020  . Osteoarthritis of hip (Left) 10/26/2020  . Diverticulosis of sigmoid colon 10/26/2020  . Abnormal MRI, lumbar spine (10/07/2020) 10/26/2020  . Lumbar central spinal stenosis with neurogenic claudication 10/26/2020  . Lumbar foraminal stenosis 10/26/2020  . Elevated C-reactive protein (CRP)  10/25/2020  . Elevated sed rate 10/25/2020  . Vitamin D deficiency 10/25/2020  . Chronic anticoagulation (Plavix) 09/20/2020  . Chronic hip pain (1ry area of Pain) (Left) 09/20/2020  . Unilateral post-traumatic osteoarthritis of hip (Left) 09/20/2020  . Chronic knee pain (2ry area of Pain) (Bilateral) 09/20/2020  . Secondary osteoarthritis of knee (Bilateral) 09/20/2020  . Osteoarthritis of knees (Bilateral) 09/20/2020  . Chronic constipation 09/20/2020  . Chronic shoulder pain (3ry area of Pain) (Bilateral) 09/20/2020  . Grade 1 (3 mm) anterolisthesis of lumbar spine (L4-5) (stable) 09/20/2020  . Chronic groin pain (Left) 09/20/2020  . Other intervertebral disc degeneration, lumbar region 09/20/2020  . Neurogenic pain 09/20/2020  . Chronic pain syndrome 09/18/2020  . Pharmacologic therapy 09/18/2020  . Disorder of skeletal system 09/18/2020  . Problems influencing health status 09/18/2020  . Acute respiratory failure with hypoxia ([place]) 04/12/2019  . STEMI (ST elevation myocardial infarction) ([place]) 04/01/2019  . STEMI involving right coronary artery ([place]) 04/01/2019  . Acute ST elevation myocardial infarction (STEMI) ([place]) 04/01/2019  . Lower extremity pain, bilateral 05/15/2018  . Elevated troponin I level 07/31/2017  . Hyperkalemia 05/28/2017  . Syncope 05/27/2017  . Meningioma ([place]) 01/05/2017  . GI bleed 12/01/2016  . Alcohol abuse 10/30/2016  . Acute on chronic systolic heart failure ([place]) 10/04/2016  . COPD (chronic obstructive pulmonary disease) with chronic bronchitis ([place]) 10/04/2016  . Obstructive sleep apnea 10/04/2016  . Hyponatremia 08/31/2016  . UTI (lower urinary tract infection) 08/31/2016  . Shortness of breath 02/11/2016  . Hyperlipidemia 12/03/2015  . Essential hypertension   . Coronary artery disease 01/19/2004    Past Surgical History:  Procedure Laterality Date  . CARDIAC CATHETERIZATION    . CORONARY ANGIOGRAPHY N/A 04/01/2019   Procedure: CORONARY  ANGIOGRAPHY;  Surgeon: Nelva Bush, MD;  Location: [facility];  Service: Cardiovascular;  Laterality: N/A;  . CORONARY ANGIOPLASTY WITH STENT PLACEMENT     x2  . CORONARY/GRAFT ACUTE MI REVASCULARIZATION N/A 04/01/2019   Procedure: Coronary/Graft Acute MI Revascularization;  Surgeon: Nelva Bush, MD;  Location: [place];  Service: Cardiovascular;  Laterality: N/A;  . TOTAL HIP ARTHROPLASTY     right    Prior to Admission medications   Medication Sig Start Date End Date Taking? Authorizing Provider  ADULT ASPIRIN REGIMEN 81 MG EC tablet TAKE 1 TABLET BY MOUTH DAILY 03/08/20   [name], MD  albuterol (VENTOLIN HFA) 108 (90 Base) MCG/ACT inhaler USE 2 PUFFS EVERY 4 HOURS AS NEEDED 10/14/19   Darylene Price A, FNP  amLODipine (NORVASC) 5 MG tablet Take 1 tablet (5 mg total) by mouth daily. 01/09/20   Alisa Graff, FNP  atorvastatin (LIPITOR) 80 MG tablet Take 0.5 tablets (40 mg total) by mouth daily. 07/19/20   [name], MD  azelastine (ASTELIN) 0.1 % nasal spray azelastine 137 mcg (0.1 %) nasal spray aerosol    [provider]  calcium carbonate (  OSCAL) 1500 (600 Ca) MG TABS tablet Take 1 tablet (1,500 mg total) by mouth 2 (two) times daily with a meal. 11/29/20 02/27/21  Milinda Pointer, MD  carvedilol (COREG) 6.25 MG tablet TAKE 1 TABLET BY MOUTH TWICE A DAY 12/08/20   [name], MD  Cholecalciferol (VITAMIN D3) 125 MCG (5000 UT) CAPS Take 1 capsule (5,000 Units total) by mouth daily with breakfast. Take along with calcium and magnesium. 11/29/20 02/27/21  Milinda Pointer, MD  clopidogrel (PLAVIX) 75 MG tablet Take 1 tablet (75 mg total) by mouth daily. 10/28/19   [name], MD  fluticasone (FLONASE) 50 MCG/ACT nasal spray Place 2 sprays into both nostrils daily.    [provider]  furosemide (LASIX) 40 MG tablet TAKE 1 TABLET BY MOUTH DAILY AND TAKE ANADDITIONAL TABLET IN THE AFTERNOON AS NEEDED FOR WEIGHT GAIN  AND LEG EDEMA 12/21/20   [name], MD  gabapentin (NEURONTIN) 300 MG capsule Take 1 capsule (300 mg total) by mouth 2 (two) times daily for 7 days, THEN 1 capsule (300 mg total) 3 (three) times daily for 7 days, THEN 1 capsule (300 mg total) 4 (four) times daily for 15 days. Follow written titration schedule.. 11/29/20 12/28/20  Milinda Pointer, MD  ipratropium (ATROVENT) 0.03 % nasal spray Place 2 sprays into both nostrils 2 (two) times daily.  05/07/20   [provider]  isosorbide mononitrate (IMDUR) 30 MG 24 hr tablet TAKE 1 TABLET BY MOUTH DAILY 12/30/19   [name], MD  Magnesium Oxide 500 MG CAPS Take 1 capsule (500 mg total) by mouth daily. 11/29/20 02/27/21  Milinda Pointer, MD  naproxen (NAPROSYN) 500 MG tablet naproxen 500 mg tablet    [provider]  nitroGLYCERIN (NITROSTAT) 0.4 MG SL tablet Take 0.4 mg by mouth every 5 (five) minutes x 3 doses as needed for chest pain. As needed for chest pain 12/06/15   [provider]  QUEtiapine (SEROQUEL) 50 MG tablet Take 50 mg by mouth at bedtime.    [provider]  Donnal Debar 200-62.5-25 MCG/INH AEPB Inhale 1 puff into the lungs daily.  04/08/20   [provider]    Allergies Chlorthalidone, Ace inhibitors, Hydrochlorothiazide, Lisinopril, Losartan, Other, and Benazepril-hydrochlorothiazide  Family History  Problem Relation Age of Onset  . Hypertension Mother   . Hyperlipidemia Mother   . Heart attack Mother   . Hypertension Father   . Hyperlipidemia Father   . Heart attack Father   . Prostate cancer Neg Hx   . Bladder Cancer Neg Hx   . Kidney cancer Neg Hx     Social History Social History   Tobacco Use  . Smoking status: Former Smoker    Packs/day: 1.00    Years: 50.00    Pack years: 50.00    Types: Cigarettes  . Smokeless tobacco: Never Used  Vaping Use  . Vaping Use: Never used  Substance Use Topics  . Alcohol use: Not Currently  . Drug use: No     Review of Systems Constitutional: No fever/chills Eyes: No visual changes. ENT: No sore throat. Cardiovascular: Denies chest pain. Respiratory: Denies shortness of breath. Gastrointestinal: No abdominal pain.  No nausea, no vomiting.  No diarrhea.  No constipation. Genitourinary: Negative for dysuria. Musculoskeletal: Bilateral hip and ankle pain. Skin: Negative for rash. Neurological: Negative for headaches, focal weakness or numbness. Endocrine:  Hyperlipidemia and hypertension Allergic/Immunilogical: ACE inhibitors and losartan. ____________________________________________   PHYSICAL EXAM:  VITAL SIGNS: ED Triage Vitals [  01/08/21 0618]  Enc Vitals Group     BP 131/66     Pulse Rate 65     Resp 18     Temp 98.7 F (37.1 C)     Temp Source Oral     SpO2 95 %     Weight 277 lb (125.6 kg)     Height 5\' 7"  (1.702 m)     Head Circumference      Peak Flow      Pain Score 7     Pain Loc      Pain Edu?      Excl. in [place]?    Constitutional: Alert and oriented. Well appearing and in no acute distress.  BMI 43.38.  Unable to bear weight. Eyes: Conjunctivae are normal. PERRL. EOMI. Head: Atraumatic. Nose: No congestion/rhinnorhea. Mouth/Throat: Mucous membranes are moist.  Oropharynx non-erythematous. Neck: No stridor. Hematological/Lymphatic/Immunilogical: No cervical lymphadenopathy. Cardiovascular: Normal rate, regular rhythm. Grossly normal heart sounds.  Good peripheral circulation. Respiratory: Normal respiratory effort.  No retractions. Lungs CTAB. Gastrointestinal: Soft and nontender. No distention. No abdominal bruits. No CVA tenderness. Musculoskeletal: No obvious deformity to bilateral lower extremities.  Patient has mild guarding bilateral hip.  In the supine position patient has full and equal l range of motion right hip hip.  Decreased range of motion with the left hip.  Mild edema bilateral ankle. Neurologic:  Normal speech and language. No gross focal  neurologic deficits are appreciated. No gait instability. Skin:  Skin is warm, dry and intact. No rash noted. Psychiatric: Mood and affect are normal. Speech and behavior are normal.  ____________________________________________   LABS (all labs ordered are listed, but only abnormal results are displayed)  Labs Reviewed  BASIC METABOLIC PANEL - Abnormal; Notable for the following components:      Result Value   Glucose, Bld 106 (*)    BUN 33 (*)    Creatinine, Ser 2.41 (*)    Calcium 8.6 (*)    GFR, Estimated 26 (*)    All other components within normal limits  CBC WITH DIFFERENTIAL/PLATELET - Abnormal; Notable for the following components:   RBC 3.80 (*)    Hemoglobin 10.7 (*)    HCT 32.8 (*)    RDW 15.8 (*)    All other components within normal limits  SARS CORONAVIRUS 2 (TAT 6-24 HRS)   ____________________________________________  EKG   ____________________________________________  RADIOLOGY Cecilio Asper, personally viewed and evaluated these images (plain radiographs) as part of my medical decision making, as well as reviewing the written report by the radiologist.  ED MD interpretation: No acute findings of the bilateral hip in comparison to previous views.  No acute findings bilateral ankle.  Official radiology report(s): DG Ankle Complete Left  Result Date: 01/08/2021 CLINICAL DATA:  Status post fall with left ankle pain. EXAM: LEFT ANKLE COMPLETE - 3+ VIEW COMPARISON:  None. FINDINGS: There is no evidence of fracture, dislocation, or joint effusion. Chronic fusion of the distal tibia and fibula is identified. IMPRESSION: No acute fracture or dislocation. Electronically Signed   By: Abelardo Diesel M.D.   On: 01/08/2021 09:31   DG Ankle Complete Right  Result Date: 01/08/2021 CLINICAL DATA:  Status post fall with ankle pain. EXAM: RIGHT ANKLE - COMPLETE 3+ VIEW COMPARISON:  None. FINDINGS: There is no evidence of fracture, dislocation, or joint effusion.  Plantar calcaneal spur is identified. IMPRESSION: No acute fracture or dislocation. Electronically Signed   By: Mallie Darting.D.  On: 01/08/2021 09:32   CT Hip Left Wo Contrast  Result Date: 01/08/2021 CLINICAL DATA:  Chronic left hip pain EXAM: CT OF THE LEFT HIP WITHOUT CONTRAST TECHNIQUE: Multidetector CT imaging of the left hip was performed according to the standard protocol. Multiplanar CT image reconstructions were also generated. COMPARISON:  Right hip/pelvic radiographs dated 01/08/2021 at 0835 hours FINDINGS: Deformity related to a prior/healed left hip fracture status post ORIF. Flattening of the femoral head with proximal migration and mild to moderate degenerative changes. Associated loose bodies posteriorly in the hip joint (coronal image 83). No acute fracture is seen. No evidence of hardware loosening or complication. No intramuscular hematoma or subcutaneous fluid collection. Right hip arthroplasty, incompletely visualized. Incidental findings include a penile prosthesis, sigmoid diverticulosis, and vascular calcifications. IMPRESSION: Deformity related to a prior/healed left hip fracture status post ORIF. No evidence of hardware loosening or complication. Associated mild to moderate degenerative changes with two loose bodies posteriorly in the hip joint, as described above. Electronically Signed   By: Julian Hy M.D.   On: 01/08/2021 11:03   DG Hip Unilat With Pelvis 2-3 Views Left  Result Date: 01/08/2021 CLINICAL DATA:  Status post fall with hip pain. EXAM: DG HIP (WITH OR WITHOUT PELVIS) 2-3V LEFT COMPARISON:  May 12, 2020 FINDINGS: There is no evidence of hip fracture or dislocation. Total right hip replacement is identified. Patient status post prior compression screw placement of left femur unchanged compared prior exam. IMPRESSION: No acute fracture or dislocation. Electronically Signed   By: Abelardo Diesel M.D.   On: 01/08/2021 09:30   DG Hip Unilat  With Pelvis 2-3 Views  Right  Result Date: 01/08/2021 CLINICAL DATA:  Status post fall this morning with hip pain. EXAM: DG HIP (WITH OR WITHOUT PELVIS) 2-3V RIGHT COMPARISON:  May 12, 2020 FINDINGS: There is no evidence of hip fracture or dislocation. Total right hip replacement is identified. Chronic changes of the left hip are noted stable. IMPRESSION: No acute fracture or dislocation. Electronically Signed   By: Abelardo Diesel M.D.   On: 01/08/2021 09:29    ____________________________________________   PROCEDURES  Procedure(s) performed (including Critical Care):  Procedures   ____________________________________________   INITIAL IMPRESSION / ASSESSMENT AND PLAN / ED COURSE  As part of my medical decision making, I reviewed the following data within the [place]         Patient presents with unable to bear weight secondary to hip and ankle pain.  Patient unable to ambulate with use of walker.  No acute findings x-rays of the bilateral hips and ankles.  Further evaluation by CT and MRI is warranted.   Assessment by PT and TOC is warranted.  Dr. Cherylann Banas assumed care of patient..      ____________________________________________   FINAL CLINICAL IMPRESSION(S) / ED DIAGNOSES  Final diagnoses:  Joint pain of right hip on movement     ED Discharge Orders    None      *Please note:  [name] was evaluated in Emergency Department on 01/08/2021 for the symptoms described in the history of present illness. He was evaluated in the context of the global COVID-19 pandemic, which necessitated consideration that the patient might be at risk for infection with the SARS-CoV-2 virus that causes COVID-19. Institutional protocols and algorithms that pertain to the evaluation of patients at risk for COVID-19 are in a state of rapid change based on information released by regulatory bodies including the CDC and federal and  state organizations. These policies and algorithms were followed  during the patient's care in the ED.  Some ED evaluations and interventions may be delayed as a result of limited staffing during and the pandemic.*   Note:  This document was prepared using Dragon voice recognition software and may include unintentional dictation errors.    [name], PA-C 01/08/21 1604    Vanessa [place], MD 01/09/21 [phone]

## 2021-01-08 NOTE — ED Notes (Signed)
Pt to CT

## 2021-01-08 NOTE — ED Notes (Signed)
Pt states that had mechanical fall both yesterday morning and this morning. R ankle is painful. R hip is painful. Pt states unable to bear weight on R or L leg.  L hip was broken about 9-12 months ago, not operated on. Ever since then, pt has had to use walker and has had difficulty bearing weight on L leg.

## 2021-01-08 NOTE — H&P (Signed)
History and Physical    [name]:[id number] DOB: November 03, 1935 DOA: 01/08/2021  PCP: Marguerita Merles, MD  Patient coming from: home  I have personally briefly reviewed patient's old medical records in [place]  Chief Complaint:  Fall x 2 with b/l ankle pain ,as well as left hip pain   HPI: [name] is a 86 y.o. male with medical history significant of  COPD,bipolar d/o, CKDIII, combined systolic diastolic CHF, CAD s/p stents,HTN,HLD,OSA not complaint with cpap,PVD, severe djd of the hips and knees who presents to ed have 2 falls within the last 48 hours with persistent b/l ankle pain and left hip pain and inability to stand. Patient states initial fall occurred while getting out of shower and he states it felt as if his ankle gave way. He states with first fall he called EMS due to not being able to get up on his own. Once they arrived and he was assisted to standing he felt well and did not come to hospital. However, the next day he had similar event and due to recurrence of falls and increase pain in b/l ankles and left hip he was brought to hospital.  He notes on ros that he has felt in his normal health otherwise, No fever/chills/ sob/chest pain/ n/v/d/abdominal pain or dysuria. He denies any head injury or loc with falls.   ED Course:  In ed patient noted to have stable vitals  And work up was notable for MRI that showed possible left hip left greater trochanter with suspected subtle acute nondisplaced fracture. Dr Posey Pronto from orthopedics was consulted who recommend admission with ortho f/u in am w/o plans for surgery at this time. At this time appears to favor a conservative approach .   Review of Systems: As per HPI otherwise 10 point review of systems negative.   Past Medical History:  Diagnosis Date  . Alcoholism ([place])   . Asthma   . Bipolar affective disorder ([place])   . Chronic combined systolic and diastolic CHF (congestive heart failure) ([place])    a. 08/2016  Echo: EF 40-45%, mild AS, mild to mod MR, mildly dil LA/RA, mild-mod TR; b. 07/2017 Echo: EF 40-45%, mod LVH, Gr1 DD, mild to mod AS, mildly dil LA, nl RV fxn; c. 03/2019 Echo: EF 45-50%, AS (not severe). Mod dil PA.  . CKD (chronic kidney disease), stage III ([place])   . COPD (chronic obstructive pulmonary disease) ([place])   . Coronary artery disease    a. 01/2004 s/p PCI and Taxus DES to dRCA (3.5 x 12 mm); b. 07/2017 Lexiscan MV: no ischemia. Sm area of apicl thinning, likely attenuation. EF 33% (GI uptake noted)-->Low risk; c. 03/2019 Inf STEMI/PCI: LM nl, LAD min irregs, D1 20ost, RI 20ost, LCX nl, RCA 90p/67m(4.0x26 Resolute Onyx DES), 940m4.0x15 Resolute Onyx DES), 10d ISR.  . Marland Kitchenegenerative joint disease    knees and hip  . Dyspnea    on exertion  . Essential hypertension   . Hyperlipidemia   . Hypertension    controlled on meds  . Ischemic cardiomyopathy    a. 08/2016 Echo: EF 40-45%; b. 07/2017 Echo: EF 40-45%; c. 03/2019 Echo: EF 45-50%.  . OSA (obstructive sleep apnea)   . [facility]   prostate ca dx 20 yrs ago  . PVD [place]peripheral vascular disease) [place]  . Tobacco abuse     Past Surgical History:  Procedure Laterality Date  . CARDIAC CATHETERIZATION    .  CORONARY ANGIOGRAPHY N/A 04/01/2019   Procedure: CORONARY ANGIOGRAPHY;  Surgeon: Nelva Bush, MD;  Location: [facility];  Service: Cardiovascular;  Laterality: N/A;  . CORONARY ANGIOPLASTY WITH STENT PLACEMENT     x2  . CORONARY/GRAFT ACUTE MI REVASCULARIZATION N/A 04/01/2019   Procedure: Coronary/Graft Acute MI Revascularization;  Surgeon: Nelva Bush, MD;  Location: [facility];  Service: Cardiovascular;  Laterality: N/A;  . TOTAL HIP ARTHROPLASTY     right     reports that he has quit smoking. His smoking use included cigarettes. He has a 50.00 pack-year smoking history. He has never used smokeless tobacco. He reports previous alcohol use. He reports that he does not use drugs.  Allergies   Allergen Reactions  . Chlorthalidone Other (See Comments)    Hyponatremia  . Ace Inhibitors Cough  . Hydrochlorothiazide   . Lisinopril Other (See Comments)  . Losartan   . Other Other (See Comments)    "ANY BLOOD PRESSURE MEDICATIONS THAT I'VE TRIED" - PT. DOES NOT REMEMBER WHICH ONES "ANY BLOOD PRESSURE MEDICATIONS THAT I'VE TRIED"- PT. DOES NOT REMEMBER WHICH ONES- CONSTIPATION  . Benazepril-Hydrochlorothiazide Other (See Comments)    Constipation    Family History  Problem Relation Age of Onset  . Hypertension Mother   . Hyperlipidemia Mother   . Heart attack Mother   . Hypertension Father   . Hyperlipidemia Father   . Heart attack Father   . Prostate cancer Neg Hx   . Bladder Cancer Neg Hx   . Kidney cancer Neg Hx     Prior to Admission medications   Medication Sig Start Date End Date Taking? Authorizing Provider  ADULT ASPIRIN REGIMEN 81 MG EC tablet TAKE 1 TABLET BY MOUTH DAILY 03/08/20   [name], MD  albuterol (VENTOLIN HFA) 108 (90 Base) MCG/ACT inhaler USE 2 PUFFS EVERY 4 HOURS AS NEEDED 10/14/19   Darylene Price A, FNP  amLODipine (NORVASC) 5 MG tablet Take 1 tablet (5 mg total) by mouth daily. 01/09/20   Alisa Graff, FNP  atorvastatin (LIPITOR) 80 MG tablet Take 0.5 tablets (40 mg total) by mouth daily. 07/19/20   [name], MD  azelastine (ASTELIN) 0.1 % nasal spray azelastine 137 mcg (0.1 %) nasal spray aerosol    [provider]  calcium carbonate (OSCAL) 1500 (600 Ca) MG TABS tablet Take 1 tablet (1,500 mg total) by mouth 2 (two) times daily with a meal. 11/29/20 02/27/21  Milinda Pointer, MD  carvedilol (COREG) 6.25 MG tablet TAKE 1 TABLET BY MOUTH TWICE A DAY 12/08/20   [name], MD  Cholecalciferol (VITAMIN D3) 125 MCG (5000 UT) CAPS Take 1 capsule (5,000 Units total) by mouth daily with breakfast. Take along with calcium and magnesium. 11/29/20 02/27/21  Milinda Pointer, MD  clopidogrel (PLAVIX) 75 MG tablet Take 1  tablet (75 mg total) by mouth daily. 10/28/19   [name], MD  fluticasone (FLONASE) 50 MCG/ACT nasal spray Place 2 sprays into both nostrils daily.    [provider]  furosemide (LASIX) 40 MG tablet TAKE 1 TABLET BY MOUTH DAILY AND TAKE ANADDITIONAL TABLET IN THE AFTERNOON AS NEEDED FOR WEIGHT GAIN AND LEG EDEMA 12/21/20   [name], MD  gabapentin (NEURONTIN) 300 MG capsule Take 1 capsule (300 mg total) by mouth 2 (two) times daily for 7 days, THEN 1 capsule (300 mg total) 3 (three) times daily for 7 days, THEN 1 capsule (300 mg total) 4 (four) times daily  for 15 days. Follow written titration schedule.. 11/29/20 12/28/20  Milinda Pointer, MD  ipratropium (ATROVENT) 0.03 % nasal spray Place 2 sprays into both nostrils 2 (two) times daily.  05/07/20   [provider]  isosorbide mononitrate (IMDUR) 30 MG 24 hr tablet TAKE 1 TABLET BY MOUTH DAILY 12/30/19   [name], MD  Magnesium Oxide 500 MG CAPS Take 1 capsule (500 mg total) by mouth daily. 11/29/20 02/27/21  Milinda Pointer, MD  naproxen (NAPROSYN) 500 MG tablet naproxen 500 mg tablet    [provider]  nitroGLYCERIN (NITROSTAT) 0.4 MG SL tablet Take 0.4 mg by mouth every 5 (five) minutes x 3 doses as needed for chest pain. As needed for chest pain 12/06/15   [provider]  QUEtiapine (SEROQUEL) 50 MG tablet Take 50 mg by mouth at bedtime.    [provider]  Donnal Debar 200-62.5-25 MCG/INH AEPB Inhale 1 puff into the lungs daily.  04/08/20   [provider]    Physical Exam: Vitals:   01/08/21 0618 01/08/21 1109 01/08/21 1409 01/08/21 1530  BP: 131/66 (!) 152/85 (!) 149/110 140/77  Pulse: 65 78 72 84  Resp: '18 12 18 17  ' Temp: 98.7 F (37.1 C) 97.8 F (36.6 C)    TempSrc: Oral Oral    SpO2: 95% 93% 95% 95%  Weight: 125.6 kg     Height: '5\' 7"'  (1.702 m)        Vitals:   01/08/21 0618 01/08/21 1109 01/08/21 1409 01/08/21 1530  BP: 131/66 (!) 152/85  (!) 149/110 140/77  Pulse: 65 78 72 84  Resp: '18 12 18 17  ' Temp: 98.7 F (37.1 C) 97.8 F (36.6 C)    TempSrc: Oral Oral    SpO2: 95% 93% 95% 95%  Weight: 125.6 kg     Height: '5\' 7"'  (1.702 m)     Constitutional: NAD, calm, comfortable Eyes: PERRL, lids and conjunctivae normal ENMT: Mucous membranes are moist. Posterior pharynx clear of any exudate or lesions.Normal dentition.  Neck: normal, supple, no masses, no thyromegaly Respiratory: clear to auscultation bilaterally, no wheezing, no crackles. Normal respiratory effort. No accessory muscle use.  Cardiovascular: Regular rate and rhythm, no murmurs / rubs / gallops. No extremity edema. 2+ pedal pulses. No carotid bruits.  Abdomen: obese no tenderness, no masses palpated. No hepatosplenomegaly. Bowel sounds positive.  Musculoskeletal: no clubbing / cyanosis. No joint deformity upper and lower extremities. Good ROM, but noted severe pain with internal rotation of left hip, as well as ROM of b/l ankles, no swollen or warmth noted of joints, no contractures. Normal muscle tone.  Skin: no rashes, lesions, ulcers. No induration Neurologic: CN 2-12 grossly intact. Sensation intact, DTR normal. Strength 5/5 in all 4.  Psychiatric: Normal judgment and insight. Alert and oriented x 3. Normal mood.    Labs on Admission: I have personally reviewed following labs and imaging studies  CBC: Recent Labs  Lab 01/08/21 1148  WBC 8.6  NEUTROABS 5.9  HGB 10.7*  HCT 32.8*  MCV 86.3  PLT 242   Basic Metabolic Panel: Recent Labs  Lab 01/08/21 1148  NA 140  K 3.7  CL 101  CO2 25  GLUCOSE 106*  BUN 33*  CREATININE 2.41*  CALCIUM 8.6*   GFR: Estimated Creatinine Clearance: 29.5 mL/min (A) (by C-G formula based on SCr of 2.41 mg/dL (H)). Liver Function Tests: No results for input(s): AST, ALT, ALKPHOS, BILITOT, PROT, ALBUMIN in the last 168 hours. No results for  input(s): LIPASE, AMYLASE in the last 168 hours. No results for input(s):  AMMONIA in the last 168 hours. Coagulation Profile: No results for input(s): INR, PROTIME in the last 168 hours. Cardiac Enzymes: No results for input(s): CKTOTAL, CKMB, CKMBINDEX, TROPONINI in the last 168 hours. BNP (last 3 results) No results for input(s): PROBNP in the last 8760 hours. HbA1C: No results for input(s): HGBA1C in the last 72 hours. CBG: No results for input(s): GLUCAP in the last 168 hours. Lipid Profile: No results for input(s): CHOL, HDL, LDLCALC, TRIG, CHOLHDL, LDLDIRECT in the last 72 hours. Thyroid Function Tests: No results for input(s): TSH, T4TOTAL, FREET4, T3FREE, THYROIDAB in the last 72 hours. Anemia Panel: No results for input(s): VITAMINB12, FOLATE, FERRITIN, TIBC, IRON, RETICCTPCT in the last 72 hours. Urine analysis:    Component Value Date/Time   COLORURINE YELLOW (A) 07/31/2017 1025   APPEARANCEUR Clear 06/14/2018 1341   LABSPEC 1.013 07/31/2017 1025   LABSPEC 1.004 04/03/2015 1216   PHURINE 5.0 07/31/2017 1025   GLUCOSEU Negative 06/14/2018 1341   GLUCOSEU Negative 04/03/2015 1216   HGBUR MODERATE (A) 07/31/2017 1025   BILIRUBINUR Negative 06/14/2018 1341   BILIRUBINUR Negative 04/03/2015 1216   KETONESUR 5 (A) 07/31/2017 1025   PROTEINUR 3+ (A) 06/14/2018 1341   PROTEINUR 100 (A) 07/31/2017 1025   UROBILINOGEN 0.2 03/27/2011 0845   NITRITE Negative 06/14/2018 1341   NITRITE NEGATIVE 07/31/2017 1025   LEUKOCYTESUR Trace (A) 06/14/2018 1341   LEUKOCYTESUR Negative 04/03/2015 1216    Radiological Exams on Admission: DG Ankle Complete Left  Result Date: 01/08/2021 CLINICAL DATA:  Status post fall with left ankle pain. EXAM: LEFT ANKLE COMPLETE - 3+ VIEW COMPARISON:  None. FINDINGS: There is no evidence of fracture, dislocation, or joint effusion. Chronic fusion of the distal tibia and fibula is identified. IMPRESSION: No acute fracture or dislocation. Electronically Signed   By: Abelardo Diesel M.D.   On: 01/08/2021 09:31   DG Ankle Complete  Right  Result Date: 01/08/2021 CLINICAL DATA:  Status post fall with ankle pain. EXAM: RIGHT ANKLE - COMPLETE 3+ VIEW COMPARISON:  None. FINDINGS: There is no evidence of fracture, dislocation, or joint effusion. Plantar calcaneal spur is identified. IMPRESSION: No acute fracture or dislocation. Electronically Signed   By: Abelardo Diesel M.D.   On: 01/08/2021 09:32   CT Hip Left Wo Contrast  Result Date: 01/08/2021 CLINICAL DATA:  Chronic left hip pain EXAM: CT OF THE LEFT HIP WITHOUT CONTRAST TECHNIQUE: Multidetector CT imaging of the left hip was performed according to the standard protocol. Multiplanar CT image reconstructions were also generated. COMPARISON:  Right hip/pelvic radiographs dated 01/08/2021 at 0835 hours FINDINGS: Deformity related to a prior/healed left hip fracture status post ORIF. Flattening of the femoral head with proximal migration and mild to moderate degenerative changes. Associated loose bodies posteriorly in the hip joint (coronal image 83). No acute fracture is seen. No evidence of hardware loosening or complication. No intramuscular hematoma or subcutaneous fluid collection. Right hip arthroplasty, incompletely visualized. Incidental findings include a penile prosthesis, sigmoid diverticulosis, and vascular calcifications. IMPRESSION: Deformity related to a prior/healed left hip fracture status post ORIF. No evidence of hardware loosening or complication. Associated mild to moderate degenerative changes with two loose bodies posteriorly in the hip joint, as described above. Electronically Signed   By: Julian Hy M.D.   On: 01/08/2021 11:03   MR HIP LEFT WO CONTRAST  Result Date: 01/08/2021 CLINICAL DATA:  Left hip pain after fall EXAM: MR  OF THE LEFT HIP WITHOUT CONTRAST TECHNIQUE: Multiplanar, multisequence MR imaging was performed. No intravenous contrast was administered. COMPARISON:  X-ray and CT 01/08/2021 FINDINGS: Bones/Joint/Cartilage Status post ORIF of the  left femur side plate and compression screw fixation construct. Susceptibility artifact from hardware degrades evaluation of the adjacent structures. There is significant remodeling of the femoral head. Fluid signal is present within the left hip joint space with remodeling/expansion of the acetabulum (series 2, image 16). There is bone marrow edema within the greater trochanter with subtle linear low T1 signal intensity component suggesting a nondisplaced fracture (series 4 and 5, image 16). Patchy marrow edema within the supra-acetabular aspect of the left ilium, which is likely reactive. Susceptibility artifact related to right total hip arthroplasty hardware without apparent complication. No periprosthetic fluid collection. Degenerative changes of the pubic symphysis. Ligaments Grossly intact. Muscles and Tendons Intramuscular edema within the left gluteus medius and minimus muscles. No acute tendinous injury is seen. Soft tissues Negative for soft tissue hematoma. No inguinal lymphadenopathy. Diverticular change within the visualized sigmoid colon. Bilateral hydroceles. IMPRESSION: 1. Bone marrow edema within the left greater trochanter with suspected subtle acute nondisplaced fracture. 2. Status post ORIF of the left femur side plate and compression screw fixation construct. Fluid signal within the left hip joint space with remodeling/expansion of the acetabulum. Findings may reflect granulomatosis/particle disease. 3. Patchy marrow edema within the supra-acetabular aspect of the left ilium, which is likely reactive. 4. Intramuscular edema within the left gluteus medius and minimus muscles, which may reflect muscle strain. Electronically Signed   By: Davina Poke D.O.   On: 01/08/2021 19:06   DG Hip Unilat With Pelvis 2-3 Views Left  Result Date: 01/08/2021 CLINICAL DATA:  Status post fall with hip pain. EXAM: DG HIP (WITH OR WITHOUT PELVIS) 2-3V LEFT COMPARISON:  May 12, 2020 FINDINGS: There is no  evidence of hip fracture or dislocation. Total right hip replacement is identified. Patient status post prior compression screw placement of left femur unchanged compared prior exam. IMPRESSION: No acute fracture or dislocation. Electronically Signed   By: Abelardo Diesel M.D.   On: 01/08/2021 09:30   DG Hip Unilat  With Pelvis 2-3 Views Right  Result Date: 01/08/2021 CLINICAL DATA:  Status post fall this morning with hip pain. EXAM: DG HIP (WITH OR WITHOUT PELVIS) 2-3V RIGHT COMPARISON:  May 12, 2020 FINDINGS: There is no evidence of hip fracture or dislocation. Total right hip replacement is identified. Chronic changes of the left hip are noted stable. IMPRESSION: No acute fracture or dislocation. Electronically Signed   By: Abelardo Diesel M.D.   On: 01/08/2021 09:29    EKG: Independently reviewed. Pending  Assessment/Plan  left greater trochanter with suspected subtle acute nondisplaced fracture -place on hip protocol  -orthopedic consult in am  -supportive care with pain regimen per protocol  -esr/crp pending  -per ortho most likely non-operative managent   COPD -stable no acute exacerbation  -resume controller medications   bipolar d/o -no active issues well compensated -continue seroquel   CKDIII -at baseline   Anemia -at close to baseline  -check anemia labs  Combined systolic diastolic CHF -well compensated  -resume cardiac regimen    CAD - s/p stents - no active issues -continue imdur,plavix,carvedilol ,statin ,asa  HTN -elevated in ed -resume  Home regimen  -treatment pain  -prn as needed   HLD -continue statin   OSA  -not complaint with cpap -monitor O2 qhs prn [place] as needed   PVD -  no acute issues    DVT prophylaxis: scd Code Status: Full Family Communication: not at bedside Disposition Plan: patient  expected to be admitted greater than 2 midnights Consults called:  Posey Pronto MD ortho  Admission status: inpatient med surg   Clance Boll  MD [facility]  If 7PM-7AM, please contact night-coverage [email or web address] Password [email or web address]  01/08/2021, 10:50 PM

## 2021-01-08 NOTE — ED Notes (Signed)
Pt on phone with MRI

## 2021-01-09 ENCOUNTER — Encounter: Payer: Self-pay | Admitting: Internal Medicine

## 2021-01-09 ENCOUNTER — Inpatient Hospital Stay: Payer: HMO

## 2021-01-09 DIAGNOSIS — N189 Chronic kidney disease, unspecified: Secondary | ICD-10-CM

## 2021-01-09 DIAGNOSIS — I5042 Chronic combined systolic (congestive) and diastolic (congestive) heart failure: Secondary | ICD-10-CM

## 2021-01-09 DIAGNOSIS — M25571 Pain in right ankle and joints of right foot: Secondary | ICD-10-CM

## 2021-01-09 DIAGNOSIS — N179 Acute kidney failure, unspecified: Secondary | ICD-10-CM

## 2021-01-09 DIAGNOSIS — M25552 Pain in left hip: Secondary | ICD-10-CM

## 2021-01-09 LAB — IRON AND TIBC
Iron: 31 ug/dL — ABNORMAL LOW (ref 45–182)
Saturation Ratios: 14 % — ABNORMAL LOW (ref 17.9–39.5)
TIBC: 220 ug/dL — ABNORMAL LOW (ref 250–450)
UIBC: 189 ug/dL

## 2021-01-09 LAB — BASIC METABOLIC PANEL
Anion gap: 9 (ref 5–15)
BUN: 29 mg/dL — ABNORMAL HIGH (ref 8–23)
CO2: 25 mmol/L (ref 22–32)
Calcium: 8.5 mg/dL — ABNORMAL LOW (ref 8.9–10.3)
Chloride: 106 mmol/L (ref 98–111)
Creatinine, Ser: 2.08 mg/dL — ABNORMAL HIGH (ref 0.61–1.24)
GFR, Estimated: 31 mL/min — ABNORMAL LOW (ref >60)
Glucose, Bld: 106 mg/dL — ABNORMAL HIGH (ref 70–99)
Potassium: 4.1 mmol/L (ref 3.5–5.1)
Sodium: 140 mmol/L (ref 135–145)

## 2021-01-09 LAB — CBC
HCT: 31 % — ABNORMAL LOW (ref 39.0–52.0)
Hemoglobin: 10.2 g/dL — ABNORMAL LOW (ref 13.0–17.0)
MCH: 28.3 pg (ref 26.0–34.0)
MCHC: 32.9 g/dL (ref 30.0–36.0)
MCV: 85.9 fL (ref 80.0–100.0)
Platelets: 215 10*3/uL (ref 150–400)
RBC: 3.61 MIL/uL — ABNORMAL LOW (ref 4.22–5.81)
RDW: 15.5 % (ref 11.5–15.5)
WBC: 8.3 10*3/uL (ref 4.0–10.5)
nRBC: 0 % (ref 0.0–0.2)

## 2021-01-09 LAB — C-REACTIVE PROTEIN: CRP: 8.9 mg/dL — ABNORMAL HIGH (ref <1.0)

## 2021-01-09 LAB — SEDIMENTATION RATE: Sed Rate: 87 mm/hr — ABNORMAL HIGH (ref 0–20)

## 2021-01-09 LAB — CREATININE, SERUM
Creatinine, Ser: 2.16 mg/dL — ABNORMAL HIGH (ref 0.61–1.24)
GFR, Estimated: 30 mL/min — ABNORMAL LOW (ref >60)

## 2021-01-09 LAB — VITAMIN D 25 HYDROXY (VIT D DEFICIENCY, FRACTURES): Vit D, 25-Hydroxy: 52.12 ng/mL (ref 30–100)

## 2021-01-09 NOTE — Evaluation (Signed)
Physical Therapy Evaluation Patient Details Name: [name] MRN: [id number] DOB: 1935/01/11 Today's Date: 01/09/2021   History of Present Illness  Pt is an 86 y/o M admitted on 01/08/21 with c/c of fall x 2 with BLE ankle pain & L hip pain. MRI showed possible L hip greater trochanter with suspected subtle acute nondisplaced fx. PMH: COPD, bipolar disorder, CKD3, combined systolic diastolic CHF, CAD s/p stents, HTN, HLD, OSA not complain with cpap, PVD, severe DJD of the hips & knees, alcoholism, asthma, dyspnea, prostate CA  Clinical Impression  Per secure chat, Dr. Posey Pronto cleared pt for participation in PT with WBAT LLE & trochanter precautions (no active hip abduction). Pt educated on precautions but would benefit from continued education re: implementing precautions into functional mobility.  Pt reports prior to admission he completed transfers to rollator & would scoot himself around. On this date, pt requires CGA supine>sit, min assist sit>supine and min/mod assist for sit>stand at EOB with RW. Pt with poor awareness of proper hand placement during transfers as pt is used to pulling up on rollator at home. Standing EOB pt unable to take side steps and returns to sitting but is able to scoot to R along EOB without assistance. Pt returns supine with assist & declines bed level exercises. Pt would benefit from STR upon d/c to maximize independence with functional mobility & reduce fall risk prior to return home. Will continue to follow pt acutely to progress gait as able.     Follow Up Recommendations SNF    Equipment Recommendations  None recommended by PT    Recommendations for Other Services       Precautions / Restrictions Precautions Precautions: Fall Restrictions Weight Bearing Restrictions: Yes LLE Weight Bearing: Weight bearing as tolerated Other Position/Activity Restrictions: No active hip abduction (trochanter precautions)      Mobility  Bed Mobility Overal bed  mobility: Needs Assistance Bed Mobility: Supine to Sit;Sit to Supine     Supine to sit: Min guard;HOB elevated Sit to supine: Min assist;+2 for physical assistance   General bed mobility comments: assistance with sit>supine to elevate LLE onto bed to maintain trochanter precautions    Transfers Overall transfer level: Needs assistance Equipment used: Rolling walker (2 wheeled) Transfers: Sit to/from Stand Sit to Stand: Min assist;Mod assist         General transfer comment: cuing for hand placement to push to standing but pt preferring to push/pull on RW with BUE (pt does this with rollator at home), only maintains standing ~30 seconds  Ambulation/Gait Ambulation/Gait assistance:  (does not attempt side steps EOB)              Stairs            Wheelchair Mobility    Modified Rankin (Stroke Patients Only)       Balance Overall balance assessment: Needs assistance Sitting-balance support: Bilateral upper extremity supported;Feet supported Sitting balance-Leahy Scale: Fair Sitting balance - Comments: close supervision/CGA sitting EOB   Standing balance support: Bilateral upper extremity supported Standing balance-Leahy Scale: Poor Standing balance comment: BUE support on RW                             Pertinent Vitals/Pain Pain Assessment: 0-10 Pain Score: 6  Pain Location: R foot, L groin Pain Descriptors / Indicators: Sore;Aching Pain Intervention(s): Monitored during session    Home Living Family/patient expects to be discharged to:: Private residence Living Arrangements: Alone  Type of Home: Apartment Home Access: Elevator     Home Layout: One level Home Equipment: Walker - 4 wheels;Wheelchair - manual;Bedside commode Additional Comments: Lives on fourth floor of senior apartment complex    Prior Function Level of Independence: Needs assistance         Comments: Pt reports he transfers to rollater and scoots himself around  on it. Pt reports he hasn't walked in a couple months, "maybe longer than that".     Hand Dominance        Extremity/Trunk Assessment   Upper Extremity Assessment Upper Extremity Assessment: Generalized weakness    Lower Extremity Assessment Lower Extremity Assessment: Generalized weakness (Pt with c/o increased pain with active & PROM dorsiflexion RLE, able to perform 3/5 knee extension BLE seated EOB)       Communication   Communication: No difficulties  Cognition Arousal/Alertness: Awake/alert Behavior During Therapy: WFL for tasks assessed/performed Overall Cognitive Status: Within Functional Limits for tasks assessed                                        General Comments General comments (skin integrity, edema, etc.): Pt frequently states "you're rushing me" despite offering pt rest breaks. Pt does not appear to fully understand trochanter precautions despite education.    Exercises General Exercises - Lower Extremity Ankle Circles/Pumps: AROM;Right;Left;Seated (RLE x 4 reps, LLE x 10 reps) Long Arc Quad: AROM;Right;Left;Both;10 reps;Seated   Assessment/Plan    PT Assessment Patient needs continued PT services  PT Problem List Decreased strength;Decreased mobility;Decreased range of motion;Decreased knowledge of precautions;Obesity;Decreased activity tolerance;Decreased safety awareness;Cardiopulmonary status limiting activity;Pain;Impaired sensation;Decreased knowledge of use of DME;Decreased balance       PT Treatment Interventions DME instruction;Therapeutic exercise;Gait training;Balance training;Manual techniques;Wheelchair mobility training;Stair training;Neuromuscular re-education;Functional mobility training;Cognitive remediation;Therapeutic activities;Patient/family education;Modalities    PT Goals (Current goals can be found in the Care Plan section)  Acute Rehab PT Goals Patient Stated Goal: get better PT Goal Formulation: With  patient Time For Goal Achievement: 01/23/21 Potential to Achieve Goals: Fair    Frequency 7X/week   Barriers to discharge Decreased caregiver support      Co-evaluation               AM-PAC PT "6 Clicks" Mobility  Outcome Measure Help needed turning from your back to your side while in a flat bed without using bedrails?: A Lot Help needed moving from lying on your back to sitting on the side of a flat bed without using bedrails?: A Lot Help needed moving to and from a bed to a chair (including a wheelchair)?: Total Help needed standing up from a chair using your arms (e.g., wheelchair or bedside chair)?: A Lot Help needed to walk in hospital room?: Total Help needed climbing 3-5 steps with a railing? : Total 6 Click Score: 9    End of Session   Activity Tolerance: Patient limited by pain;Patient limited by fatigue Patient left: in bed;with call bell/phone within reach   PT Visit Diagnosis: Difficulty in walking, not elsewhere classified (R26.2);Muscle weakness (generalized) (M62.81);History of falling (Z91.81)    Time: 5621-3086 PT Time Calculation (min) (ACUTE ONLY): 19 min   Charges:   PT Evaluation $PT Eval Low Complexity: 1 Low PT Treatments $Therapeutic Activity: 8-22 mins        Lavone Nian, PT, DPT 01/09/21, 1:44 PM   [name] 01/09/2021, 1:40 PM

## 2021-01-09 NOTE — ED Notes (Signed)
Report to rasheda, rn.

## 2021-01-09 NOTE — Progress Notes (Signed)
Patient is currently resting. Applied ice pack to Left Hip. Repositioned. Patient has been using the urinal.Pain med given early morning. Pharmacy contacted for respiratory inhaler.  Report given to oncoming Nurse.

## 2021-01-09 NOTE — Evaluation (Signed)
Occupational Therapy Evaluation Patient Details Name: [name] MRN: [id number] DOB: 1935-03-22 Today's Date: 01/09/2021    History of Present Illness Pt is an 86 y/o M admitted on 01/08/21 with c/c of fall x 2 with BLE ankle pain & L hip pain. MRI showed possible L hip greater trochanter with suspected subtle acute nondisplaced fx. PMH: COPD, bipolar disorder, CKD3, combined systolic diastolic CHF, CAD s/p stents, HTN, HLD, OSA not complain with cpap, PVD, severe DJD of the hips & knees, alcoholism, asthma, dyspnea, prostate CA   Clinical Impression   [name] was seen for OT evaluation this date. Prior to hospital admission, pt was MOD I using 4WW (per PT, pt does not ambulate frequently). Pt lives alone in apartment c elevator access. Pt presents to acute OT demonstrating impaired ADL performance and functional mobility 2/2 poor insight into deficits, decreased activity tolerance, and functional strength/ROM/balance deficits. Pt currently requires MOD A for LBD seated EOB. SETUP + SUPERVISION grooming tasks seated EOB. Pt would benefit from skilled OT to address noted impairments and functional limitations (see below for any additional details) in order to maximize safety and independence while minimizing falls risk and caregiver burden. Upon hospital discharge, recommend STR to maximize pt safety and return to PLOF.     Follow Up Recommendations  SNF    Equipment Recommendations  Other (comment) (TBD)    Recommendations for Other Services       Precautions / Restrictions Precautions Precautions: Fall Restrictions Weight Bearing Restrictions: Yes LLE Weight Bearing: Weight bearing as tolerated Other Position/Activity Restrictions: No active hip abduction (trochanter precautions)      Mobility Bed Mobility Overal bed mobility: Needs Assistance Bed Mobility: Supine to Sit;Sit to Supine     Supine to sit: Min guard;HOB elevated Sit to supine: Min assist   General bed  mobility comments: assistance with sit>supine to elevate LLE onto bed to maintain trochanter precautions    Transfers General transfer comment: Pt deferred stating "forget about it"    Balance Overall balance assessment: Needs assistance Sitting-balance support: Bilateral upper extremity supported;Feet supported Sitting balance-Leahy Scale: Fair                   ADL either performed or assessed with clinical judgement   ADL Overall ADL's : Needs assistance/impaired                                       General ADL Comments: MOD A for LBD seated EOB. SETUP + SUPERVISION grooming tasks seated EOB                  Pertinent Vitals/Pain Pain Assessment: Faces Pain Score: 6  Faces Pain Scale: Hurts even more Pain Location: R foot, L groin Pain Descriptors / Indicators: Sore;Aching Pain Intervention(s): Limited activity within patient's tolerance;Repositioned     Hand Dominance     Extremity/Trunk Assessment Upper Extremity Assessment Upper Extremity Assessment: Generalized weakness   Lower Extremity Assessment Lower Extremity Assessment: Generalized weakness       Communication Communication Communication: No difficulties   Cognition Arousal/Alertness: Awake/alert Behavior During Therapy: Agitated Overall Cognitive Status: Within Functional Limits for tasks assessed                                     General Comments  Exercises Exercises: Other exercises Other Exercises Other Exercises: Pt educated re: OT role, DME recs, d/c recs, falls prevention Other Exercises: LBD, sup<.sit, sitting balance/tolerance   Shoulder Instructions      Home Living Family/patient expects to be discharged to:: Private residence Living Arrangements: Alone Available Help at Discharge: Family;Available PRN/intermittently Type of Home: Apartment Home Access: Elevator     Home Layout: One level               Home Equipment:  Walker - 4 wheels;Wheelchair - manual;Bedside commode   Additional Comments: Lives on fourth floor of senior apartment complex      Prior Functioning/Environment Level of Independence: Needs assistance        Comments: Pt reports he transfers to rollater and scoots himself around on it. Pt reports he hasn't walked in a couple months, "maybe longer than that".        OT Problem List: Decreased strength;Decreased range of motion;Decreased activity tolerance;Impaired balance (sitting and/or standing);Decreased safety awareness      OT Treatment/Interventions: Self-care/ADL training;Therapeutic exercise;Energy conservation;Therapeutic activities;Patient/family education;Balance training    OT Goals(Current goals can be found in the care plan section) Acute Rehab OT Goals Patient Stated Goal: get better OT Goal Formulation: With patient Time For Goal Achievement: 01/23/21 Potential to Achieve Goals: Good ADL Goals Pt Will Perform Grooming: sitting;with set-up Pt Will Perform Lower Body Dressing: with min assist;sitting/lateral leans Pt Will Transfer to Toilet: with mod assist;stand pivot transfer;bedside commode (c LRAD PRN)  OT Frequency: Min 1X/week   Barriers to D/C: Inaccessible home environment;Decreased caregiver support             AM-PAC OT "6 Clicks" Daily Activity     Outcome Measure Help from another person eating meals?: None Help from another person taking care of personal grooming?: A Little Help from another person toileting, which includes using toliet, bedpan, or urinal?: A Lot Help from another person bathing (including washing, rinsing, drying)?: A Lot Help from another person to put on and taking off regular upper body clothing?: A Little Help from another person to put on and taking off regular lower body clothing?: A Lot 6 Click Score: 16   End of Session Nurse Communication: Mobility status  Activity Tolerance: Patient tolerated treatment  well Patient left: in bed;with call bell/phone within reach  OT Visit Diagnosis: Other abnormalities of gait and mobility (R26.89);History of falling (Z91.81)                Time: 1610-9604 OT Time Calculation (min): 11 min Charges:  OT General Charges $OT Visit: 1 Visit OT Evaluation $OT Eval Low Complexity: 1 Low OT Treatments $Self Care/Home Management : 8-22 mins   Dessie Coma, M.S. OTR/L  01/09/21, 5:08 PM  ascom [phone]

## 2021-01-09 NOTE — ED Notes (Signed)
OT at bedside. 

## 2021-01-09 NOTE — ED Notes (Signed)
Per Leim Fabry MD, RN can d/c active rest orders

## 2021-01-09 NOTE — ED Notes (Signed)
Patient transported to CT 

## 2021-01-09 NOTE — ED Notes (Signed)
Provided urinal. °

## 2021-01-09 NOTE — Progress Notes (Signed)
PROGRESS NOTE    [name]  YYQ:[id number] DOB: 10/11/1935 DOA: 01/08/2021 PCP: Marguerita Merles, MD  Brief Narrative:  HPI per Dr. Myles Rosenthal on 01/08/21 [name] is a 86 y.o. male with medical history significant of  COPD,bipolar d/o, CKDIII, combined systolic diastolic CHF, CAD s/p stents,HTN,HLD,OSA not complaint with cpap,PVD, severe djd of the hips and knees who presents to ed have 2 falls within the last 48 hours with persistent b/l ankle pain and left hip pain and inability to stand. Patient states initial fall occurred while getting out of shower and he states it felt as if his ankle gave way. He states with first fall he called EMS due to not being able to get up on his own. Once they arrived and he was assisted to standing he felt well and did not come to hospital. However, the next day he had similar event and due to recurrence of falls and increase pain in b/l ankles and left hip he was brought to hospital.  He notes on ros that he has felt in his normal health otherwise, No fever/chills/ sob/chest pain/ n/v/d/abdominal pain or dysuria. He denies any head injury or loc with falls.   ED Course:  In ed patient noted to have stable vitals  And work up was notable for MRI that showed possible left hip left greater trochanter with suspected subtle acute nondisplaced fracture. Dr Posey Pronto from orthopedics was consulted who recommend admission with ortho f/u in am w/o plans for surgery at this time. At this time appears to favor a conservative approach .   **Interim History  Orthopedic surgery evaluated and they are less concerned about his left hip and more concerned about his his foot so they ordered a CT of his right foot.  Left hip seems more chronic as he sees pain management and just received a cortisone injection in a few weeks ago.  We will follow-up on further orthopedic recommendations and PT OT evaluating and he was able to stand at the edge of the bed with moderate  assistance but did not want to take much steps.  Assessment & Plan:   Active Problems:   Hip fracture ([place])  Left Hip/Left Greater Ttrochanter with suspected subtle acute nondisplaced fracture -He was placed on Hip protocol  -Left Hip X-Ray showed "No acute fracture or dislocation." -Left Hip CT Scan w/o Contrast showed "Deformity related to a prior/healed left hip fracture status post ORIF. No evidence of hardware loosening or complication. Associated mild to moderate degenerative changes with two loose bodies posteriorly in the hip joint, as described above." -MRI Hip showed "Bone marrow edema within the left greater trochanter with suspected subtle acute nondisplaced fracture. 2. Status post ORIF of the left femur side plate and compression screw fixation  construct. Fluid signal within the left hip joint space with remodeling/expansion of the acetabulum. Findings may reflect granulomatosis/particle disease. 3. Patchy marrow edema within the supra-acetabular aspect of the left ilium, which is likely reactive. 4. Intramuscular edema within the left gluteus medius and minimus muscles, which may reflect muscle strain." -Orthopedic Surgery Consulted and Dr. Posey Pronto is less concerned about his left hip and more concerned about his right foot.  Patient has had chronic left hip pain and sees pain management-2 weeks ago received a cortisone injection of the left hip -PT OT evaluating and is bedrest restrictions were lifted and he was placed on weightbearing as tolerated as well as trochanter precautions and upon evaluation patient was able  to stand at the edge of the bed with moderate assistance but did not want to take many steps. PT reports that he has not walked in a couple months or maybe more and that he pushes himself around with his walker and the seat  -Continue supportive care with pain regimen per protocol as below -ESR was 87 and CRP is still pending -Orthopedic Surgery most likely recommending  non-operative management further care per orthopedic surgery  Right Foot and Ankle Pain -Orthopedic Surgery consulted and evaluating and recommending CT Right foot w/o Contrast  -PT/OT evaluating   COPD -Stable no acute exacerbation  -Resume controller medications with Albuterol 2 puff IH q4hprn Wheezing and SOB -Continue to monitor for decompensation  Bipolar Disorder -No active issues well compensated -C/w Quetiapine 50 mg po qHS  AKI on CKDIIIb -His BUN/creatinine on admission was 33/2.41 and is now improved 29/2.16 -Avoid further nephrotoxic medications, contrast dyes, hypotension and renally adjust medications -C/w Furosemide 40 mg po Daily  -Continue to monitor and trend renal function carefully and repeat CMP in a.m.  Chronic Combined Systolic and Diastolic CHF -Currently compensated -C/w Carvedilol 6.25 mg po BID and Furosemide 40 mg po Daily  -Strict I's and O's and Daily Weights -Continue to Monitor Volume Status Carefully   CAD -s/p stents -no active issues and denies CP currently -C/w  -ASA 81 mg po Daily, Atorvastatin 40 mg po Daily and Clopidogrel 75 mg po Daily, Isosorbide Mononitrate 30 mg po Daily, and Carvedilol 6.25 mg po BID  -Will need EKG if Develops Chest Pain  HTN -Was elevated in the ED -Resume Home regimen with Amlodipine 5 mg po Daily, Carvedilol 6.25 mg po BID, and Furosemide 40 mg po Daily  -Elevated BP could be 2/2 to Pain; Continue treat Pain with Hydrocodone-Acetaminophen 1-2 tab po q6hprn Moderate Pain and IV Morphine 0.5 mg q2hprn  HLD -Continue Atorvastatin 40 mg po Daily   OSA  -He has been non-complaint with CPAP -Monitor O2 qhs prn [place] as needed   PVD -Currently does not have any active Issues and will continue ASA 81 mg po Daily, Atorvastatin 40 mg po Daily and Clopidogrel 75 mg po Daily   Normocytic Anemia -Patient's Hgb/Hct went from 10.7/32.8 -> 10.2/31.0 -Checked an iron panel and an iron level was 31, U IBC is 189,  TIBC is 220, saturation ratios of 14% -Continue to Monitor for S/Sx of Bleeding; Currently no overt bleeding noted -Repeat CBC in the AM   Morbid Obesity -Complicates overall prognosis and care -Estimated body mass index is 43.38 kg/m as calculated from the following:   Height as of this encounter: '5\' 7"'  (1.702 m).   Weight as of this encounter: 125.6 kg. -Weight Loss and Dietary Counseling given   DVT prophylaxis: Heparin 5,000 unit sq q8h Code Status: FULL CODE  Family Communication: No family present at bedside  Disposition Plan: Pending further clinical improvement and clearance by orthopedic surgery and evaluation by PT and OT  Status is: Inpatient  Remains inpatient appropriate because:Unsafe d/c plan, IV treatments appropriate due to intensity of illness or inability to take PO and Inpatient level of care appropriate due to severity of illness   Dispo: The patient is from: Home              Anticipated d/c is to: SNF              Anticipated d/c date is: 2 days  Patient currently is not medically stable to d/c.   Difficult to place patient No  Consultants:   Orthopedic Surgery Dr. Posey Pronto   Procedures: None  Antimicrobials:  Anti-infectives (From admission, onward)   None       Subjective: Seen and examined at bedside and states that his right ankle is hurting as well as his left hip.  No nausea or vomiting.  Denies any lightheadedness or dizziness but states that he keeps falling.  Denies any other concerns or complaints at this time and no chest pain or shortness of breath.  Objective: Vitals:   01/08/21 1530 01/09/21 0538 01/09/21 0807 01/09/21 0808  BP: 140/77 (!) 157/74 (!) 143/76 (!) 143/76  Pulse: 84 96 70 70  Resp: '17 18  18  ' Temp:  98.2 F (36.8 C)    TempSrc:      SpO2: 95% 97%  96%  Weight:      Height:        Intake/Output Summary (Last 24 hours) at 01/09/2021 0846 Last data filed at 01/09/2021 0500 Gross per 24 hour  Intake --   Output 425 ml  Net -425 ml   Filed Weights   01/08/21 0618  Weight: 125.6 kg   Examination: Physical Exam:  Constitutional: WN/WD morbidly obese African-American male in mild distress appears slightly uncomfortable  Eyes: Lids and conjunctivae normal, sclerae anicteric  ENMT: External Ears, Nose appear normal. Grossly normal hearing.  Neck: Appears normal, supple, no cervical masses, normal ROM, no appreciable thyromegaly; no JVD Respiratory: Diminished to auscultation bilaterally, no wheezing, rales, rhonchi or crackles. Normal respiratory effort and patient is not tachypenic. No accessory muscle use.  Unlabored breathing Cardiovascular: RRR, no murmurs / rubs / gallops. S1 and S2 auscultated.  Has some mild ankle edema on the right Abdomen: Soft, non-tender, distended secondary body habitus. Bowel sounds positive.  GU: Deferred. Musculoskeletal: No clubbing / cyanosis of digits/nails. No joint deformity upper and lower extremities.  Skin: No rashes, lesions, ulcers limited skin evaluation. No induration; Warm and dry.  Neurologic: CN 2-12 grossly intact with no focal deficits. Romberg sign and cerebellar reflexes not assessed.  Psychiatric: Normal judgment and insight. Alert and oriented x 3. Normal mood and appropriate affect.   Data Reviewed: I have personally reviewed following labs and imaging studies  CBC: Recent Labs  Lab 01/08/21 1148 01/09/21 0730  WBC 8.6 8.3  NEUTROABS 5.9  --   HGB 10.7* 10.2*  HCT 32.8* 31.0*  MCV 86.3 85.9  PLT 209 562   Basic Metabolic Panel: Recent Labs  Lab 01/08/21 1148 01/09/21 0730  NA 140 140  K 3.7 4.1  CL 101 106  CO2 25 25  GLUCOSE 106* 106*  BUN 33* 29*  CREATININE 2.41* 2.08*  CALCIUM 8.6* 8.5*   GFR: Estimated Creatinine Clearance: 34.2 mL/min (A) (by C-G formula based on SCr of 2.08 mg/dL (H)). Liver Function Tests: No results for input(s): AST, ALT, ALKPHOS, BILITOT, PROT, ALBUMIN in the last 168 hours. No results  for input(s): LIPASE, AMYLASE in the last 168 hours. No results for input(s): AMMONIA in the last 168 hours. Coagulation Profile: No results for input(s): INR, PROTIME in the last 168 hours. Cardiac Enzymes: No results for input(s): CKTOTAL, CKMB, CKMBINDEX, TROPONINI in the last 168 hours. BNP (last 3 results) No results for input(s): PROBNP in the last 8760 hours. HbA1C: No results for input(s): HGBA1C in the last 72 hours. CBG: No results for input(s): GLUCAP in the last 168 hours. Lipid  Profile: No results for input(s): CHOL, HDL, LDLCALC, TRIG, CHOLHDL, LDLDIRECT in the last 72 hours. Thyroid Function Tests: No results for input(s): TSH, T4TOTAL, FREET4, T3FREE, THYROIDAB in the last 72 hours. Anemia Panel: No results for input(s): VITAMINB12, FOLATE, FERRITIN, TIBC, IRON, RETICCTPCT in the last 72 hours. Sepsis Labs: No results for input(s): PROCALCITON, LATICACIDVEN in the last 168 hours.  Recent Results (from the past 240 hour(s))  SARS CORONAVIRUS 2 (TAT 6-24 HRS) Nasopharyngeal Nasopharyngeal Swab     Status: None   Collection Time: 01/08/21  3:00 PM   Specimen: Nasopharyngeal Swab  Result Value Ref Range Status   SARS Coronavirus 2 NEGATIVE NEGATIVE Final    Comment: (NOTE) SARS-CoV-2 target nucleic acids are NOT DETECTED.  The SARS-CoV-2 RNA is generally detectable in upper and lower respiratory specimens during the acute phase of infection. Negative results do not preclude SARS-CoV-2 infection, do not rule out co-infections with other pathogens, and should not be used as the sole basis for treatment or other patient management decisions. Negative results must be combined with clinical observations, patient history, and epidemiological information. The expected result is Negative.  Fact Sheet for Patients: SugarRoll.be  Fact Sheet for Healthcare Providers: https://www.woods-mathews.com/  This test is not yet approved or  cleared by the [place] FDA and  has been authorized for detection and/or diagnosis of SARS-CoV-2 by FDA under an Emergency Use Authorization (EUA). This EUA will remain  in effect (meaning this test can be used) for the duration of the COVID-19 declaration under Se ction 564(b)(1) of the Act, 21 U.S.C. section 360bbb-3(b)(1), unless the authorization is terminated or revoked sooner.  Performed at [facility], [place] [address], [place], [place] [address]      RN Pressure Injury Documentation:     Estimated body mass index is 43.38 kg/m as calculated from the following:   Height as of this encounter: '5\' 7"'  (1.702 m).   Weight as of this encounter: 125.6 kg.  Malnutrition Type:   Malnutrition Characteristics   Nutrition Interventions:   Radiology Studies: DG Ankle Complete Left  Result Date: 01/08/2021 CLINICAL DATA:  Status post fall with left ankle pain. EXAM: LEFT ANKLE COMPLETE - 3+ VIEW COMPARISON:  None. FINDINGS: There is no evidence of fracture, dislocation, or joint effusion. Chronic fusion of the distal tibia and fibula is identified. IMPRESSION: No acute fracture or dislocation. Electronically Signed   By: Abelardo Diesel M.D.   On: 01/08/2021 09:31   DG Ankle Complete Right  Result Date: 01/08/2021 CLINICAL DATA:  Status post fall with ankle pain. EXAM: RIGHT ANKLE - COMPLETE 3+ VIEW COMPARISON:  None. FINDINGS: There is no evidence of fracture, dislocation, or joint effusion. Plantar calcaneal spur is identified. IMPRESSION: No acute fracture or dislocation. Electronically Signed   By: Abelardo Diesel M.D.   On: 01/08/2021 09:32   CT Hip Left Wo Contrast  Result Date: 01/08/2021 CLINICAL DATA:  Chronic left hip pain EXAM: CT OF THE LEFT HIP WITHOUT CONTRAST TECHNIQUE: Multidetector CT imaging of the left hip was performed according to the standard protocol. Multiplanar CT image reconstructions were also generated. COMPARISON:  Right hip/pelvic radiographs dated  01/08/2021 at 0835 hours FINDINGS: Deformity related to a prior/healed left hip fracture status post ORIF. Flattening of the femoral head with proximal migration and mild to moderate degenerative changes. Associated loose bodies posteriorly in the hip joint (coronal image 83). No acute fracture is seen. No evidence of hardware loosening or complication. No intramuscular hematoma or  subcutaneous fluid collection. Right hip arthroplasty, incompletely visualized. Incidental findings include a penile prosthesis, sigmoid diverticulosis, and vascular calcifications. IMPRESSION: Deformity related to a prior/healed left hip fracture status post ORIF. No evidence of hardware loosening or complication. Associated mild to moderate degenerative changes with two loose bodies posteriorly in the hip joint, as described above. Electronically Signed   By: Julian Hy M.D.   On: 01/08/2021 11:03   MR HIP LEFT WO CONTRAST  Result Date: 01/08/2021 CLINICAL DATA:  Left hip pain after fall EXAM: MR OF THE LEFT HIP WITHOUT CONTRAST TECHNIQUE: Multiplanar, multisequence MR imaging was performed. No intravenous contrast was administered. COMPARISON:  X-ray and CT 01/08/2021 FINDINGS: Bones/Joint/Cartilage Status post ORIF of the left femur side plate and compression screw fixation construct. Susceptibility artifact from hardware degrades evaluation of the adjacent structures. There is significant remodeling of the femoral head. Fluid signal is present within the left hip joint space with remodeling/expansion of the acetabulum (series 2, image 16). There is bone marrow edema within the greater trochanter with subtle linear low T1 signal intensity component suggesting a nondisplaced fracture (series 4 and 5, image 16). Patchy marrow edema within the supra-acetabular aspect of the left ilium, which is likely reactive. Susceptibility artifact related to right total hip arthroplasty hardware without apparent complication. No  periprosthetic fluid collection. Degenerative changes of the pubic symphysis. Ligaments Grossly intact. Muscles and Tendons Intramuscular edema within the left gluteus medius and minimus muscles. No acute tendinous injury is seen. Soft tissues Negative for soft tissue hematoma. No inguinal lymphadenopathy. Diverticular change within the visualized sigmoid colon. Bilateral hydroceles. IMPRESSION: 1. Bone marrow edema within the left greater trochanter with suspected subtle acute nondisplaced fracture. 2. Status post ORIF of the left femur side plate and compression screw fixation construct. Fluid signal within the left hip joint space with remodeling/expansion of the acetabulum. Findings may reflect granulomatosis/particle disease. 3. Patchy marrow edema within the supra-acetabular aspect of the left ilium, which is likely reactive. 4. Intramuscular edema within the left gluteus medius and minimus muscles, which may reflect muscle strain. Electronically Signed   By: Davina Poke D.O.   On: 01/08/2021 19:06   Chest Portable 1 View  Result Date: 01/09/2021 CLINICAL DATA:  Two falls. History of heart disease, COPD, hypertension. EXAM: PORTABLE CHEST 1 VIEW COMPARISON:  04/13/2019 FINDINGS: Heart size and pulmonary vascularity are normal for technique. No definite airspace disease or consolidation in the lungs. No pleural effusions. No pneumothorax. Calcification of the aorta. IMPRESSION: No active disease. Electronically Signed   By: Lucienne Capers M.D.   On: 01/09/2021 00:22   DG Hip Unilat With Pelvis 2-3 Views Left  Result Date: 01/08/2021 CLINICAL DATA:  Status post fall with hip pain. EXAM: DG HIP (WITH OR WITHOUT PELVIS) 2-3V LEFT COMPARISON:  May 12, 2020 FINDINGS: There is no evidence of hip fracture or dislocation. Total right hip replacement is identified. Patient status post prior compression screw placement of left femur unchanged compared prior exam. IMPRESSION: No acute fracture or  dislocation. Electronically Signed   By: Abelardo Diesel M.D.   On: 01/08/2021 09:30   DG Hip Unilat  With Pelvis 2-3 Views Right  Result Date: 01/08/2021 CLINICAL DATA:  Status post fall this morning with hip pain. EXAM: DG HIP (WITH OR WITHOUT PELVIS) 2-3V RIGHT COMPARISON:  May 12, 2020 FINDINGS: There is no evidence of hip fracture or dislocation. Total right hip replacement is identified. Chronic changes of the left hip are noted stable. IMPRESSION: No acute  fracture or dislocation. Electronically Signed   By: Abelardo Diesel M.D.   On: 01/08/2021 09:29   Scheduled Meds: . amLODipine  5 mg Oral Daily  . aspirin EC  81 mg Oral Daily  . atorvastatin  40 mg Oral Daily  . carvedilol  6.25 mg Oral BID WC  . clopidogrel  75 mg Oral Daily  . furosemide  40 mg Oral Daily  . heparin  5,000 Units Subcutaneous Q8H  . isosorbide mononitrate  30 mg Oral Daily  . QUEtiapine  50 mg Oral QHS   Continuous Infusions:   LOS: 1 day   Kerney Elbe, DO [facility] PAGER is on [place]  If 7PM-7AM, please contact night-coverage [email or web address]

## 2021-01-09 NOTE — Consult Note (Signed)
ORTHOPAEDIC CONSULTATION  REQUESTING PHYSICIAN: Kerney Elbe, DO  Chief Complaint:   R foot, L hip pain  History of Present Illness: [name] is a 86 y.o. male who had a fall 2 days ago and noted significant right ankle/foot pain as well as left hip pain.  Of note, the patient has had chronic left hip pain and has had difficulty ambulating at baseline.  He is followed by pain management and underwent a left hip joint aspiration and corticosteroid injection on 12/23/2020.  This aspirate was not concerning for any infectious process.  Of note, he has had prior right total hip arthroplasty and left hip DHS placement.  After the fall, he had difficulty standing, but was able to mobilize with EMS.  However he then had falls again yesterday with increased pain in the right foot and left hip so he presented to the emergency department.  He has a history of COPD, bipolar, CKD stage III, CHF, CAD with prior stenting, OSA without compliance with CPAP, peripheral vascular disease, and history of significant degenerative changes to the hip and knee joints.  He does not ambulate well at baseline.  In the emergency department, MRI showed possible left hip greater trochanter nondisplaced fracture.  Additionally, there was an effusion in the hip joint on the left side.  Other radiographs were negative.  Past Medical History:  Diagnosis Date  . Alcoholism ([place])   . Asthma   . Bipolar affective disorder ([place])   . Chronic combined systolic and diastolic CHF (congestive heart failure) ([place])    a. 08/2016 Echo: EF 40-45%, mild AS, mild to mod MR, mildly dil LA/RA, mild-mod TR; b. 07/2017 Echo: EF 40-45%, mod LVH, Gr1 DD, mild to mod AS, mildly dil LA, nl RV fxn; c. 03/2019 Echo: EF 45-50%, AS (not severe). Mod dil PA.  . CKD (chronic kidney disease), stage III ([place])   . COPD (chronic obstructive pulmonary disease) ([place])   . Coronary artery  disease    a. 01/2004 s/p PCI and Taxus DES to dRCA (3.5 x 12 mm); b. 07/2017 Lexiscan MV: no ischemia. Sm area of apicl thinning, likely attenuation. EF 33% (GI uptake noted)-->Low risk; c. 03/2019 Inf STEMI/PCI: LM nl, LAD min irregs, D1 20ost, RI 20ost, LCX nl, RCA 90p/31m(4.0x26 Resolute Onyx DES), 974m4.0x15 Resolute Onyx DES), 10d ISR.  . Marland Kitchenegenerative joint disease    knees and hip  . Dyspnea    on exertion  . Essential hypertension   . Hyperlipidemia   . Hypertension    controlled on meds  . Ischemic cardiomyopathy    a. 08/2016 Echo: EF 40-45%; b. 07/2017 Echo: EF 40-45%; c. 03/2019 Echo: EF 45-50%.  . OSA (obstructive sleep apnea)   . [facility]   prostate ca dx 20 yrs ago  . PVD [place]peripheral vascular disease) [place]  . Tobacco abuse    Past Surgical History:  Procedure Laterality Date  . CARDIAC CATHETERIZATION    . CORONARY ANGIOGRAPHY N/A 04/01/2019   Procedure: CORONARY ANGIOGRAPHY;  Surgeon: EnNelva BushMD;  [place] [facility];  Service: Cardiovascular;  Laterality: N/A;  . CORONARY ANGIOPLASTY WITH STENT PLACEMENT     x2  . CORONARY/GRAFT ACUTE MI REVASCULARIZATION N/A 04/01/2019   Procedure: Coronary/Graft Acute MI Revascularization;  Surgeon: EnNelva BushMD;  [place] [facility];  Service: Cardiovascular;  Laterality: N/A;  . TOTAL HIP ARTHROPLASTY     right   Social History   Socioeconomic  History  . Marital status: Legally Separated    Spouse name: Not on file  . Number of children: Not on file  . Years of education: Not on file  . Highest education level: Not on file  Occupational History  . Occupation: retired  Tobacco Use  . Smoking status: Former Smoker    Packs/day: 1.00    Years: 50.00    Pack years: 50.00    Types: Cigarettes  . Smokeless tobacco: Never Used  Vaping Use  . Vaping Use: Never used  Substance and Sexual Activity  . Alcohol use: Not Currently  . Drug use: No  . Sexual activity: Never  Other  Topics Concern  . Not on file  Social History Narrative  . Not on file   Social Determinants of Health   Financial Resource Strain: Not on file  Food Insecurity: Not on file  Transportation Needs: Not on file  Physical Activity: Not on file  Stress: Not on file  Social Connections: Not on file   Family History  Problem Relation Age of Onset  . Hypertension Mother   . Hyperlipidemia Mother   . Heart attack Mother   . Hypertension Father   . Hyperlipidemia Father   . Heart attack Father   . Prostate cancer Neg Hx   . Bladder Cancer Neg Hx   . Kidney cancer Neg Hx    Allergies  Allergen Reactions  . Chlorthalidone Other (See Comments)    Hyponatremia  . Ace Inhibitors Cough  . Hydrochlorothiazide   . Lisinopril Other (See Comments)  . Losartan   . Other Other (See Comments)    "ANY BLOOD PRESSURE MEDICATIONS THAT I'VE TRIED" - PT. DOES NOT REMEMBER WHICH ONES "ANY BLOOD PRESSURE MEDICATIONS THAT I'VE TRIED"- PT. DOES NOT REMEMBER WHICH ONES- CONSTIPATION  . Benazepril-Hydrochlorothiazide Other (See Comments)    Constipation   Prior to Admission medications   Medication Sig Start Date End Date Taking? Authorizing Provider  ADULT ASPIRIN REGIMEN 81 MG EC tablet TAKE 1 TABLET BY MOUTH DAILY 03/08/20  Yes [name], MD  albuterol (VENTOLIN HFA) 108 (90 Base) MCG/ACT inhaler USE 2 PUFFS EVERY 4 HOURS AS NEEDED 10/14/19  Yes Darylene Price A, FNP  amLODipine (NORVASC) 5 MG tablet Take 1 tablet (5 mg total) by mouth daily. 01/09/20  Yes Darylene Price A, FNP  atorvastatin (LIPITOR) 80 MG tablet Take 0.5 tablets (40 mg total) by mouth daily. 07/19/20  Yes [name], MD  azelastine (ASTELIN) 0.1 % nasal spray azelastine 137 mcg (0.1 %) nasal spray aerosol   Yes [provider]  calcium carbonate (OSCAL) 1500 (600 Ca) MG TABS tablet Take 1 tablet (1,500 mg total) by mouth 2 (two) times daily with a meal. 11/29/20 02/27/21 Yes Milinda Pointer, MD  carvedilol  (COREG) 6.25 MG tablet TAKE 1 TABLET BY MOUTH TWICE A DAY 12/08/20  Yes [name], MD  Cholecalciferol (VITAMIN D3) 125 MCG (5000 UT) CAPS Take 1 capsule (5,000 Units total) by mouth daily with breakfast. Take along with calcium and magnesium. 11/29/20 02/27/21 Yes Milinda Pointer, MD  clopidogrel (PLAVIX) 75 MG tablet Take 1 tablet (75 mg total) by mouth daily. 10/28/19  Yes [name], MD  fluticasone (FLONASE) 50 MCG/ACT nasal spray Place 2 sprays into both nostrils daily.   Yes [provider]  furosemide (LASIX) 40 MG tablet TAKE 1 TABLET BY MOUTH DAILY AND TAKE ANADDITIONAL TABLET IN THE AFTERNOON AS NEEDED FOR WEIGHT GAIN AND LEG EDEMA  12/21/20  Yes [name], MD  gabapentin (NEURONTIN) 300 MG capsule Take 1 capsule (300 mg total) by mouth 2 (two) times daily for 7 days, THEN 1 capsule (300 mg total) 3 (three) times daily for 7 days, THEN 1 capsule (300 mg total) 4 (four) times daily for 15 days. Follow written titration schedule.. 11/29/20 12/28/20 Yes Milinda Pointer, MD  ipratropium (ATROVENT) 0.03 % nasal spray Place 2 sprays into both nostrils daily. 05/07/20  Yes [provider]  isosorbide mononitrate (IMDUR) 30 MG 24 hr tablet TAKE 1 TABLET BY MOUTH DAILY 12/30/19  Yes [name], MD  Magnesium Oxide 500 MG CAPS Take 1 capsule (500 mg total) by mouth daily. 11/29/20 02/27/21 Yes Milinda Pointer, MD  naproxen (NAPROSYN) 500 MG tablet Take 500 mg by mouth daily.   Yes [provider]  nitroGLYCERIN (NITROSTAT) 0.4 MG SL tablet Take 0.4 mg by mouth every 5 (five) minutes x 3 doses as needed for chest pain. As needed for chest pain 12/06/15  Yes [provider]  QUEtiapine (SEROQUEL) 50 MG tablet Take 50 mg by mouth at bedtime.   Yes [provider]  TRELEGY ELLIPTA 200-62.5-25 MCG/INH AEPB Inhale 1 puff into the lungs daily.  04/08/20  Yes [provider]   Recent Labs    01/08/21 1148 01/09/21 0730   WBC 8.6 8.3  HGB 10.7* 10.2*  HCT 32.8* 31.0*  PLT 209 215  K 3.7 4.1  CL 101 106  CO2 25 25  BUN 33* 29*  CREATININE 2.41* 2.16*  2.08*  GLUCOSE 106* 106*  CALCIUM 8.6* 8.5*   DG Ankle Complete Left  Result Date: 01/08/2021 CLINICAL DATA:  Status post fall with left ankle pain. EXAM: LEFT ANKLE COMPLETE - 3+ VIEW COMPARISON:  None. FINDINGS: There is no evidence of fracture, dislocation, or joint effusion. Chronic fusion of the distal tibia and fibula is identified. IMPRESSION: No acute fracture or dislocation. Electronically Signed   By: Abelardo Diesel M.D.   On: 01/08/2021 09:31   DG Ankle Complete Right  Result Date: 01/08/2021 CLINICAL DATA:  Status post fall with ankle pain. EXAM: RIGHT ANKLE - COMPLETE 3+ VIEW COMPARISON:  None. FINDINGS: There is no evidence of fracture, dislocation, or joint effusion. Plantar calcaneal spur is identified. IMPRESSION: No acute fracture or dislocation. Electronically Signed   By: Abelardo Diesel M.D.   On: 01/08/2021 09:32   CT Hip Left Wo Contrast  Result Date: 01/08/2021 CLINICAL DATA:  Chronic left hip pain EXAM: CT OF THE LEFT HIP WITHOUT CONTRAST TECHNIQUE: Multidetector CT imaging of the left hip was performed according to the standard protocol. Multiplanar CT image reconstructions were also generated. COMPARISON:  Right hip/pelvic radiographs dated 01/08/2021 at 0835 hours FINDINGS: Deformity related to a prior/healed left hip fracture status post ORIF. Flattening of the femoral head with proximal migration and mild to moderate degenerative changes. Associated loose bodies posteriorly in the hip joint (coronal image 83). No acute fracture is seen. No evidence of hardware loosening or complication. No intramuscular hematoma or subcutaneous fluid collection. Right hip arthroplasty, incompletely visualized. Incidental findings include a penile prosthesis, sigmoid diverticulosis, and vascular calcifications. IMPRESSION: Deformity related to a  prior/healed left hip fracture status post ORIF. No evidence of hardware loosening or complication. Associated mild to moderate degenerative changes with two loose bodies posteriorly in the hip joint, as described above. Electronically Signed   By: Julian Hy M.D.   On: 01/08/2021 11:03   CT FOOT RIGHT  WO CONTRAST  Result Date: 01/09/2021 CLINICAL DATA:  Right foot pain after fall 1 day ago EXAM: CT OF THE RIGHT FOOT WITHOUT CONTRAST TECHNIQUE: Multidetector CT imaging of the right foot was performed according to the standard protocol. Multiplanar CT image reconstructions were also generated. COMPARISON:  Right ankle x-ray 01/08/2021 FINDINGS: Bones/Joint/Cartilage No acute fracture or dislocation of the right ankle. There is a chronic healed fracture of the distal fibular metaphysis. Degenerative spurring at the distal tibiofibular joint. Chronic well corticated bony fragments along the inferior aspect of the medial malleolus. Degenerative spurring within the medial gutter. Ankle mortise is congruent. There are a few tiny intra-articular loose bodies adjacent to the lateral talar shoulder. No tibiotalar joint effusion. Acute nondisplaced fracture of the tip of the anterior process of the calcaneus (series 8, image 59). Remainder of the calcaneus is intact. Bidirectional calcaneal enthesophytes. Subtalar joints are maintained. Bones of the midfoot are intact without fracture or malalignment. Mild joint space narrowing throughout the midfoot most pronounced at the naviculocuneiform articulation. Tarsometatarsal joints are intact with mild joint space narrowing. Severe osteoarthritis of the first MTP joint with prominent subchondral cystic changes and marginal osteophyte formation. Degenerative changes of the hallux sesamoid complex. No fracture or malalignment of the forefoot. Ligaments Suboptimally assessed by CT. Muscles and Tendons No evidence of acute musculotendinous abnormality. Soft tissues Mild  soft tissue swelling at the lateral aspect of the midfoot/hindfoot. No organized fluid collection or hematoma. IMPRESSION: 1. Acute nondisplaced fracture of the tip of the anterior process of the calcaneus. 2. Severe osteoarthritis of the first MTP joint. 3. Additional chronic degenerative and posttraumatic changes, as above. Electronically Signed   By: Davina Poke D.O.   On: 01/09/2021 14:32   MR HIP LEFT WO CONTRAST  Result Date: 01/08/2021 CLINICAL DATA:  Left hip pain after fall EXAM: MR OF THE LEFT HIP WITHOUT CONTRAST TECHNIQUE: Multiplanar, multisequence MR imaging was performed. No intravenous contrast was administered. COMPARISON:  X-ray and CT 01/08/2021 FINDINGS: Bones/Joint/Cartilage Status post ORIF of the left femur side plate and compression screw fixation construct. Susceptibility artifact from hardware degrades evaluation of the adjacent structures. There is significant remodeling of the femoral head. Fluid signal is present within the left hip joint space with remodeling/expansion of the acetabulum (series 2, image 16). There is bone marrow edema within the greater trochanter with subtle linear low T1 signal intensity component suggesting a nondisplaced fracture (series 4 and 5, image 16). Patchy marrow edema within the supra-acetabular aspect of the left ilium, which is likely reactive. Susceptibility artifact related to right total hip arthroplasty hardware without apparent complication. No periprosthetic fluid collection. Degenerative changes of the pubic symphysis. Ligaments Grossly intact. Muscles and Tendons Intramuscular edema within the left gluteus medius and minimus muscles. No acute tendinous injury is seen. Soft tissues Negative for soft tissue hematoma. No inguinal lymphadenopathy. Diverticular change within the visualized sigmoid colon. Bilateral hydroceles. IMPRESSION: 1. Bone marrow edema within the left greater trochanter with suspected subtle acute nondisplaced fracture.  2. Status post ORIF of the left femur side plate and compression screw fixation construct. Fluid signal within the left hip joint space with remodeling/expansion of the acetabulum. Findings may reflect granulomatosis/particle disease. 3. Patchy marrow edema within the supra-acetabular aspect of the left ilium, which is likely reactive. 4. Intramuscular edema within the left gluteus medius and minimus muscles, which may reflect muscle strain. Electronically Signed   By: Davina Poke D.O.   On: 01/08/2021 19:06   Chest Portable 1 View  Result Date: 01/09/2021 CLINICAL DATA:  Two falls. History of heart disease, COPD, hypertension. EXAM: PORTABLE CHEST 1 VIEW COMPARISON:  04/13/2019 FINDINGS: Heart size and pulmonary vascularity are normal for technique. No definite airspace disease or consolidation in the lungs. No pleural effusions. No pneumothorax. Calcification of the aorta. IMPRESSION: No active disease. Electronically Signed   By: Lucienne Capers M.D.   On: 01/09/2021 00:22   DG Hip Unilat With Pelvis 2-3 Views Left  Result Date: 01/08/2021 CLINICAL DATA:  Status post fall with hip pain. EXAM: DG HIP (WITH OR WITHOUT PELVIS) 2-3V LEFT COMPARISON:  May 12, 2020 FINDINGS: There is no evidence of hip fracture or dislocation. Total right hip replacement is identified. Patient status post prior compression screw placement of left femur unchanged compared prior exam. IMPRESSION: No acute fracture or dislocation. Electronically Signed   By: Abelardo Diesel M.D.   On: 01/08/2021 09:30   DG Hip Unilat  With Pelvis 2-3 Views Right  Result Date: 01/08/2021 CLINICAL DATA:  Status post fall this morning with hip pain. EXAM: DG HIP (WITH OR WITHOUT PELVIS) 2-3V RIGHT COMPARISON:  May 12, 2020 FINDINGS: There is no evidence of hip fracture or dislocation. Total right hip replacement is identified. Chronic changes of the left hip are noted stable. IMPRESSION: No acute fracture or dislocation. Electronically Signed    By: Abelardo Diesel M.D.   On: 01/08/2021 09:29     Positive ROS: All other systems have been reviewed and were otherwise negative with the exception of those mentioned in the HPI and as above.  Physical Exam: BP 132/70 (BP Location: Left Arm)   Pulse 73   Temp 98.2 F (36.8 C)   Resp 18   Ht 5' 7" (1.702 m)   Wt 125.6 kg   SpO2 97%   BMI 43.38 kg/m  General:  Alert, no acute distress Psychiatric:  Patient is competent for consent with normal mood and affect   Cardiovascular:  No pedal edema, regular rate and rhythm Respiratory:  No wheezing, non-labored breathing GI:  Abdomen is soft and non-tender Skin:  No lesions in the area of chief complaint, no erythema Neurologic:  Sensation intact distally, CN grossly intact Lymphatic:  No axillary or cervical lymphadenopathy  Orthopedic Exam:  LLE: 5/5 DF/PF/EHL SILT s/s/t/sp/dp distr Foot wwp RoM hip: 90 degrees FF, 30 degrees ER, 10 degrees IR, all relatively painless No significant tenderness to palpation over the greater trochanter  RLE: 5/5 DF/PF/EHL SILT s/s/t/sp/dp distr Foot wwp RoM hip: 90 degrees FF, 30 degrees ER, 10 degrees IR, all relatively painless Significant tenderness over the dorsal lateral aspect of the hindfoot/midfoot region   Imaging: Left hip: Questionable left greater trochanter nondisplaced fracture.  Joint effusion of the left hip significant deformity of the left femoral head with degenerative changes  Labs: Component     Latest Ref Rng & Units 01/09/2021  Sed Rate     0 - 20 mm/hr 87 (H)  CRP     <1.0 mg/dL 8.9 (H)     Assessment/Plan: 86 year old male presenting with mild left groin pain and more significant right foot pain.  CT scan shows nondisplaced anterior calcaneus fracture. 1.  Regarding his right foot pain, which the patient feels is more limiting at this point in time, I recommended obtaining a CT scan of the right foot given that radiographs are negative.  The CT scan shows a  nondisplaced right anterior calcaneus fracture.  This can be treated with a walking boot for  immobilization.  Patient can remain weightbearing as tolerated.  2.  Regarding his left hip, he has appropriate range of motion and does not have clinical signs of a septic hip joint. The patient had a recent hip joint aspiration and corticosteroid injection, and pain feels like his baseline amount of pain.  Given this exam, I do not think hip joint aspiration is warranted at this time.  Additionally, he does not have significant tenderness about the greater trochanter on the left hip.  However, given MRI findings, we can plan to limit his hip abduction to protect in the event of a possible fracture.  He can remain weightbearing as tolerated on the left lower extremity.  3.  The patient does have elevated inflammatory markers with ESR and CRP abnormal.  I would recommend medical work-up for this.  If there is no other clinical concern for infection or other acute inflammatory process, this can be performed as an outpatient.   Leim Fabry   01/09/2021 5:27 PM

## 2021-01-10 DIAGNOSIS — G4733 Obstructive sleep apnea (adult) (pediatric): Secondary | ICD-10-CM

## 2021-01-10 DIAGNOSIS — I739 Peripheral vascular disease, unspecified: Secondary | ICD-10-CM

## 2021-01-10 DIAGNOSIS — J449 Chronic obstructive pulmonary disease, unspecified: Secondary | ICD-10-CM

## 2021-01-10 DIAGNOSIS — S92001A Unspecified fracture of right calcaneus, initial encounter for closed fracture: Secondary | ICD-10-CM

## 2021-01-10 DIAGNOSIS — I5022 Chronic systolic (congestive) heart failure: Secondary | ICD-10-CM

## 2021-01-10 DIAGNOSIS — N184 Chronic kidney disease, stage 4 (severe): Secondary | ICD-10-CM

## 2021-01-10 LAB — COMPREHENSIVE METABOLIC PANEL
ALT: 10 U/L (ref 0–44)
AST: 16 U/L (ref 15–41)
Albumin: 2.8 g/dL — ABNORMAL LOW (ref 3.5–5.0)
Alkaline Phosphatase: 91 U/L (ref 38–126)
Anion gap: 10 (ref 5–15)
BUN: 33 mg/dL — ABNORMAL HIGH (ref 8–23)
CO2: 24 mmol/L (ref 22–32)
Calcium: 8.2 mg/dL — ABNORMAL LOW (ref 8.9–10.3)
Chloride: 106 mmol/L (ref 98–111)
Creatinine, Ser: 2.31 mg/dL — ABNORMAL HIGH (ref 0.61–1.24)
GFR, Estimated: 27 mL/min — ABNORMAL LOW (ref >60)
Glucose, Bld: 98 mg/dL (ref 70–99)
Potassium: 3.8 mmol/L (ref 3.5–5.1)
Sodium: 140 mmol/L (ref 135–145)
Total Bilirubin: 1.1 mg/dL (ref 0.3–1.2)
Total Protein: 6.7 g/dL (ref 6.5–8.1)

## 2021-01-10 LAB — CBC WITH DIFFERENTIAL/PLATELET
Abs Immature Granulocytes: 0.04 10*3/uL (ref 0.00–0.07)
Basophils Absolute: 0 10*3/uL (ref 0.0–0.1)
Basophils Relative: 0 %
Eosinophils Absolute: 0.1 10*3/uL (ref 0.0–0.5)
Eosinophils Relative: 1 %
HCT: 29 % — ABNORMAL LOW (ref 39.0–52.0)
Hemoglobin: 9.8 g/dL — ABNORMAL LOW (ref 13.0–17.0)
Immature Granulocytes: 1 %
Lymphocytes Relative: 20 %
Lymphs Abs: 1.4 10*3/uL (ref 0.7–4.0)
MCH: 28.6 pg (ref 26.0–34.0)
MCHC: 33.8 g/dL (ref 30.0–36.0)
MCV: 84.5 fL (ref 80.0–100.0)
Monocytes Absolute: 0.8 10*3/uL (ref 0.1–1.0)
Monocytes Relative: 11 %
Neutro Abs: 4.5 10*3/uL (ref 1.7–7.7)
Neutrophils Relative %: 67 %
Platelets: 203 10*3/uL (ref 150–400)
RBC: 3.43 MIL/uL — ABNORMAL LOW (ref 4.22–5.81)
RDW: 15.6 % — ABNORMAL HIGH (ref 11.5–15.5)
WBC: 6.8 10*3/uL (ref 4.0–10.5)
nRBC: 0 % (ref 0.0–0.2)

## 2021-01-10 LAB — PHOSPHORUS: Phosphorus: 4.6 mg/dL (ref 2.5–4.6)

## 2021-01-10 LAB — MAGNESIUM: Magnesium: 2.1 mg/dL (ref 1.7–2.4)

## 2021-01-10 MED ORDER — ACETAMINOPHEN 325 MG PO TABS: 650.0000 mg | ORAL_TABLET | Freq: Four times a day (QID) | ORAL | Status: DC | PRN

## 2021-01-10 MED ORDER — ENSURE ENLIVE PO LIQD
237.0000 mL | Freq: Three times a day (TID) | ORAL | Status: DC
  Administered 2021-01-10 – 2021-01-13 (×7): 237 mL via ORAL

## 2021-01-10 MED ORDER — ADULT MULTIVITAMIN W/MINERALS CH
1.0000 | ORAL_TABLET | Freq: Every day | ORAL | Status: DC
  Administered 2021-01-11 – 2021-01-13 (×3): 1 via ORAL
  Filled 2021-01-10 (×3): qty 1

## 2021-01-10 NOTE — NC FL2 (Signed)
[place] LEVEL OF CARE SCREENING TOOL     IDENTIFICATION  Patient Name: [name] Birthdate: Nov 02, 1935 Sex: male Admission Date (Current Location): 01/08/2021  [place] and [place] Number:  Engineering geologist and Address:  [facility], [address], [place], [place] [address]      Provider Number: [id number]  Attending Physician Name and Address:  Annita Brod, MD  Relative Name and Phone Number:  Nira Conn (Daughter)   [phone] [facility] Phone)    Current Level of Care: Hospital Recommended Level of Care: [place] Prior Approval Number:    Date Approved/Denied:   PASRR Number: [id number]  Discharge Plan: SNF    Current Diagnoses: Patient Active Problem List   Diagnosis Date Noted  . Hip fracture ([place]) 01/08/2021  . Primary osteoarthritis of left knee 01/06/2021  . Arthropathy of left knee 01/06/2021  . Chronic pain of left knee 01/06/2021  . Class 3 obesity with alveolar hypoventilation and body mass index (BMI) of 40.0 to 44.9 in adult ([place]) 12/23/2020  . Hip joint effusion (Left) 12/23/2020  . History of alcohol abuse 11/29/2020  . Elevated rheumatoid factor 11/29/2020  . Polyarthritis with positive rheumatoid factor ([place]) 11/17/2020  . Elevated uric acid in blood 11/17/2020  . Stage 3b chronic kidney disease ([employer]) 10/26/2020  . Proteinuria, unspecified 10/26/2020  . Elevated BUN 10/26/2020  . Elevated serum creatinine 10/26/2020  . Hypochloremia 10/26/2020  . Hypocalcemia 10/26/2020  . Hypoalbuminemia 10/26/2020  . Elevated alkaline phosphatase level 10/26/2020  . Elevated hemoglobin A1c 10/26/2020  . Elevated random blood glucose level 10/26/2020  . Anemia in stage 3b chronic kidney disease ([place]) 10/26/2020  .  Klippel-Feil deformity at the C5-C7 levels 10/26/2020  . Carotid artery calcification, bilateral 10/26/2020  . History of 2.0 cm thyroid mass (Left lobe) 10/26/2020  .  Bilateral renal cysts 10/26/2020  . Lumbosacral spondylosis 10/26/2020  . Osteoarthritis of facet joint of lumbar spine 10/26/2020  . DDD (degenerative disc disease), lumbosacral 10/26/2020  . History of total hip replacement (Right) 10/26/2020  . Osteoarthritis of hip (Left) 10/26/2020  . Diverticulosis of sigmoid colon 10/26/2020  . Abnormal MRI, lumbar spine (10/07/2020) 10/26/2020  . Lumbar central spinal stenosis with neurogenic claudication 10/26/2020  . Lumbar foraminal stenosis 10/26/2020  . Elevated C-reactive protein (CRP) 10/25/2020  . Elevated sed rate 10/25/2020  . Vitamin D deficiency 10/25/2020  . Chronic anticoagulation (Plavix) 09/20/2020  . Chronic hip pain (1ry area of Pain) (Left) 09/20/2020  . Unilateral post-traumatic osteoarthritis of hip (Left) 09/20/2020  . Chronic knee pain (2ry area of Pain) (Bilateral) 09/20/2020  . Secondary osteoarthritis of knee (Bilateral) 09/20/2020  . Osteoarthritis of knees (Bilateral) 09/20/2020  . Chronic constipation 09/20/2020  . Chronic shoulder pain (3ry area of Pain) (Bilateral) 09/20/2020  . Grade 1 (3 mm) anterolisthesis of lumbar spine (L4-5) (stable) 09/20/2020  . Chronic groin pain (Left) 09/20/2020  . Other intervertebral disc degeneration, lumbar region 09/20/2020  . Neurogenic pain 09/20/2020  . Chronic pain syndrome 09/18/2020  . Pharmacologic therapy 09/18/2020  . Disorder of skeletal system 09/18/2020  . Problems influencing health status 09/18/2020  . Acute respiratory failure with hypoxia ([place]) 04/12/2019  . STEMI (ST elevation myocardial infarction) ([place]) 04/01/2019  . STEMI involving right coronary artery ([place]) 04/01/2019  . Acute ST elevation myocardial infarction (STEMI) ([place]) 04/01/2019  . Lower extremity pain, bilateral 05/15/2018  . Elevated troponin I level 07/31/2017  . Hyperkalemia 05/28/2017  . Syncope 05/27/2017  .  Meningioma ([place]) 01/05/2017  . GI bleed 12/01/2016  . Alcohol abuse 10/30/2016   . Acute on chronic systolic heart failure ([place]) 10/04/2016  . COPD (chronic obstructive pulmonary disease) with chronic bronchitis ([place]) 10/04/2016  . Obstructive sleep apnea 10/04/2016  . Hyponatremia 08/31/2016  . UTI (lower urinary tract infection) 08/31/2016  . Shortness of breath 02/11/2016  . Hyperlipidemia 12/03/2015  . Essential hypertension   . Coronary artery disease 01/19/2004    Orientation RESPIRATION BLADDER Height & Weight     Self,Time,Place,Situation  Normal Continent Weight: 277 lb (125.6 kg) Height:  5\' 7"  (170.2 cm)  BEHAVIORAL SYMPTOMS/MOOD NEUROLOGICAL BOWEL NUTRITION STATUS      Continent Diet (heart, thin liquids)  AMBULATORY STATUS COMMUNICATION OF NEEDS Skin   Limited Assist (RW) Verbally Normal                       Personal Care Assistance Level of Assistance  Feeding,Dressing,Bathing Bathing Assistance: Maximum assistance Feeding assistance: Independent Dressing Assistance: Maximum assistance     Functional Limitations Info             SPECIAL CARE FACTORS FREQUENCY  PT (By licensed PT),OT (By licensed OT)     PT Frequency: 5 x/week OT Frequency: 5 x/week            Contractures      Additional Factors Info  Code Status,Allergies Code Status Info: full code Allergies Info: chlorthalidone, ace inhibitors, hydrochlorothiazide, lisinopril, losartan, benazepril-hydrochlorothiazide, bp meds           Current Medications (01/10/2021):  This is the current hospital active medication list Current Facility-Administered Medications  Medication Dose Route Frequency Provider Last Rate Last Admin  . albuterol (VENTOLIN HFA) 108 (90 Base) MCG/ACT inhaler 2 puff  2 puff Inhalation Q4H PRN Arta Silence, MD   2 puff at 01/09/21 2307  . amLODipine (NORVASC) tablet 5 mg  5 mg Oral Daily Arta Silence, MD   5 mg at 01/10/21 0816  . aspirin EC tablet 81 mg  81 mg Oral Daily Arta Silence, MD   81 mg at 01/10/21 0816  .  atorvastatin (LIPITOR) tablet 40 mg  40 mg Oral Daily Arta Silence, MD   40 mg at 01/10/21 0815  . carvedilol (COREG) tablet 6.25 mg  6.25 mg Oral BID WC Arta Silence, MD   6.25 mg at 01/10/21 0815  . clopidogrel (PLAVIX) tablet 75 mg  75 mg Oral Daily Arta Silence, MD   75 mg at 01/10/21 0816  . furosemide (LASIX) tablet 40 mg  40 mg Oral Daily Arta Silence, MD   40 mg at 01/10/21 0816  . heparin injection 5,000 Units  5,000 Units Subcutaneous Q8H Clance Boll, MD   5,000 Units at 01/10/21 0532  . HYDROcodone-acetaminophen (NORCO/VICODIN) 5-325 MG per tablet 1-2 tablet  1-2 tablet Oral Q6H PRN Clance Boll, MD   2 tablet at 01/10/21 0815  . isosorbide mononitrate (IMDUR) 24 hr tablet 30 mg  30 mg Oral Daily Arta Silence, MD   30 mg at 01/10/21 0815  . morphine 2 MG/ML injection 0.5 mg  0.5 mg Intravenous Q2H PRN Myles Rosenthal A, MD      . QUEtiapine (SEROQUEL) tablet 50 mg  50 mg Oral QHS Clance Boll, MD   50 mg at 01/09/21 2121     Discharge Medications: Please see discharge summary for a list of discharge medications.  Relevant Imaging Results:  Relevant Lab Results:   Additional  Information SS #: [id number]  Cesar Chavez, LCSW

## 2021-01-10 NOTE — Progress Notes (Signed)
Physical Therapy Treatment Patient Details Name: [name] MRN: [id number] DOB: January 01, 1935 Today's Date: 01/10/2021    History of Present Illness Pt is an 86 y/o M admitted on 01/08/21 with c/c of fall x 2 with BLE ankle pain & L hip pain. MRI showed possible L hip greater trochanter with suspected subtle acute nondisplaced fx. PMH: COPD, bipolar disorder, CKD3, combined systolic diastolic CHF, CAD s/p stents, HTN, HLD, OSA not complain with cpap, PVD, severe DJD of the hips & knees, alcoholism, asthma, dyspnea, prostate CA    PT Comments    Since last PT session, pt had CT scan that showed a nondisplaced right anterior calcaneus fracture with ortho MD recommending: "This can be treated with a walking boot for immobilization.  Patient can remain weightbearing as tolerated."   Pt seen for PT treatment & educated on new precautions (WBAT RLE with walking boot) and reviewed LLE WBAT with no active hip abduction with pt unable to recall this from yesterday. No walking boot in room & nurse notified & working on obtaining boot. Provided pt with supine LE HEP & pt performed the following exercises as noted below with multimodal cuing & instruction for proper technique, as well as cuing to count repetitions out loud as pt with poor attention to task. Pt lethargic requiring cuing to maintain eyes open during session. HR = 74-126 bpm lying in bed, SpO2 96-97% but pt sounds as though he needs to clear throat but doesn't. Pt encouraged to transfer to sitting on EOB with pt mildly initiating movement then pt grimacing & quickly returning to semi fowler position in bed. Pt closes eyes and with minimal engagement with PT at this point. When asked, pt reports it is painful when he tries to sit EOB but doesn't elaborate on location/feeling/rating of pain. Pt ultimately reports he is finished participating at this time. Pt left in bed with needs met.   Pt would benefit from STR upon d/c to maximize independence with  functional mobility & reduce fall risk prior to return home. Will continue to follow pt acutely to progress transfers & gait as able, and focus on strengthening, balance, and activity tolerance.      Follow Up Recommendations  SNF     Equipment Recommendations  None recommended by PT    Recommendations for Other Services       Precautions / Restrictions Precautions Precautions: Fall Required Braces or Orthoses: Other Brace Other Brace: RLE walking boot Restrictions Weight Bearing Restrictions: Yes RLE Weight Bearing: Weight bearing as tolerated (in walking boot) LLE Weight Bearing: Weight bearing as tolerated Other Position/Activity Restrictions: No active hip abduction (trochanter precautions) LLE    Mobility  Bed Mobility Overal bed mobility:  (pt declines) Bed Mobility: Sit to Supine;Supine to Sit       Sit to supine: Min assist      Transfers                 General transfer comment: Deferred  Ambulation/Gait                 Stairs             Wheelchair Mobility    Modified Rankin (Stroke Patients Only)       Balance Overall balance assessment: Needs assistance   Sitting balance-Leahy Scale: Fair  Cognition Arousal/Alertness: Lethargic Behavior During Therapy:  (irritable) Overall Cognitive Status: Difficult to assess                                        Exercises General Exercises - Lower Extremity Ankle Circles/Pumps: AROM;Right;Left;10 reps;Supine Quad Sets: AROM;Right;Left;10 reps;Supine Gluteal Sets: AROM;10 reps;Supine Short Arc Quad: AROM;Right;Left;10 reps;Supine Heel Slides: AROM;Right;Left;10 reps;Supine Hip ABduction/ADduction: AROM;10 reps;Supine (hip adduction pillow squeezes) Straight Leg Raises: AROM;Right;Left;10 reps;Supine    General Comments        Pertinent Vitals/Pain Pain Assessment: Faces Faces Pain Scale: Hurts little  more Pain Location: BLE feet (R>L) Pain Descriptors / Indicators: Sore;Aching;Grimacing;Guarding Pain Intervention(s): Monitored during session;Repositioned    Home Living                      Prior Function            PT Goals (current goals can now be found in the care plan section) Acute Rehab PT Goals Patient Stated Goal: get better PT Goal Formulation: With patient Time For Goal Achievement: 01/23/21 Progress towards PT goals: PT to reassess next treatment    Frequency    7X/week      PT Plan Current plan remains appropriate    Co-evaluation              AM-PAC PT "6 Clicks" Mobility   Outcome Measure  Help needed turning from your back to your side while in a flat bed without using bedrails?: A Lot Help needed moving from lying on your back to sitting on the side of a flat bed without using bedrails?: A Lot Help needed moving to and from a bed to a chair (including a wheelchair)?: Total Help needed standing up from a chair using your arms (e.g., wheelchair or bedside chair)?: A Lot Help needed to walk in hospital room?: Total Help needed climbing 3-5 steps with a railing? : Total 6 Click Score: 9    End of Session   Activity Tolerance: Patient limited by fatigue Patient left: in bed;with call bell/phone within reach;with bed alarm set Nurse Communication:  (need for RLE walking boot) PT Visit Diagnosis: Difficulty in walking, not elsewhere classified (R26.2);Muscle weakness (generalized) (M62.81);History of falling (Z91.81)     Time: 0102-7253 PT Time Calculation (min) (ACUTE ONLY): 26 min  Charges:  $Therapeutic Exercise: 8-22 mins $Therapeutic Activity: 8-22 mins                     Lavone Nian, PT, DPT 01/10/21, 2:43 PM    [name] 01/10/2021, 2:28 PM

## 2021-01-10 NOTE — Progress Notes (Signed)
PROGRESS NOTE  [name] BPZ:[id number] DOB: 06-13-1935 DOA: 01/08/2021 PCP: Marguerita Merles, MD  HPI/Recap of past 16 hours: 86 year old male with past medical history of COPD, bipolar disorder, stage IV chronic kidney disease, chronic systolic CHF plus CAD and sleep apnea and PVD admitted on 1/22 after coming to the emergency room with 2 falls in the past few days with persistent bilateral ankle pain, left hip pain and inability to stand. In the emergency room, MRI noted questionable left hip/left greater trochanter with suspected subtle acute nondisplaced fracture. Orthopedic surgery consulted and it was felt that it was not confirmed that his left hip had indeed a operable fracture. Is possible some of this may be more from chronic pain and DJD plus bursitis. It was recommended weightbearing and limiting hip abduction. However, orthopedic surgery also evaluated patient's right foot and a CT scan noted a nondisplaced right anterior calcaneal fracture. Walking boot recommended and PT evaluation done which recommended skilled nursing.  Today, patient is doing a little bit better, some pain and difficulty standing and walking.  Assessment/Plan: Principal Problem:   Right calcaneal fracture: Seen by orthopedic surgery. Nonop. Boot. Active Problems:   Essential hypertension   COPD (chronic obstructive pulmonary disease) with chronic bronchitis ([place]): Stable. Continue inhalers.    Obstructive sleep apnea: As needed oxygen nightly. Patient noncompliant with CPAP.    Chronic hip pain (1ry area of Pain) (Left): Previous history of fracture and repair. Some intramuscular edema reflecting muscle strain also seen in the area. Orthopedic surgery at this time recommends limited abduction. No evidence of septic joint. Physical therapy/skilled nursing for now.    CKD (chronic kidney disease), stage IV ([place]): With chronic anemia: Stable. At baseline.     Morbid obesity ([place]): Meets criteria for BMI  greater than 40.    PVD (peripheral vascular disease) ([place]): Continue aspirin Plavix.    Chronic systolic CHF (congestive heart failure) ([place]): Appears to be euvolemic. Continue Coreg and Lasix.   Code Status: Full code  Family Communication: Left message with family  Disposition Plan: Skilled nursing when bed available   Consultants:  Orthopedic surgery-[place]  Procedures:  None  Antimicrobials:  None  DVT prophylaxis: Heparin subcu  Level of care: Med-Surg   Objective: Vitals:   01/10/21 0900 01/10/21 1130  BP: (!) 156/80 117/64  Pulse: 75 68  Resp: 18 19  Temp: 98 F (36.7 C) 98.1 F (36.7 C)  SpO2: 99% 96%    Intake/Output Summary (Last 24 hours) at 01/10/2021 1227 Last data filed at 01/10/2021 1000 Gross per 24 hour  Intake 0 ml  Output 0 ml  Net 0 ml   Filed Weights   01/08/21 0618  Weight: 125.6 kg   Body mass index is 43.38 kg/m.  Exam:   General: Alert and oriented x2, no acute distress  Cardiovascular: Regular rate and rhythm, S1/S2, 2 out of 6 systolic ejection murmur  Respiratory: Decreased breath sounds throughout secondary body habitus  Abdomen: Soft, obese, nontender, positive bowel sounds  Musculoskeletal: No clubbing or cyanosis, trace pitting edema  Skin: No skin breaks, tears or lesions  Psychiatry: Appropriate, no evidence of psychoses  Neurology: No focal deficits   Data Reviewed: CBC: Recent Labs  Lab 01/08/21 1148 01/09/21 0730 01/10/21 0245  WBC 8.6 8.3 6.8  NEUTROABS 5.9  --  4.5  HGB 10.7* 10.2* 9.8*  HCT 32.8* 31.0* 29.0*  MCV 86.3 85.9 84.5  PLT 209 215 242   Basic Metabolic Panel: Recent Labs  Lab 01/08/21 1148 01/09/21 0730 01/10/21 0245  NA 140 140 140  K 3.7 4.1 3.8  CL 101 106 106  CO2 25 25 24   GLUCOSE 106* 106* 98  BUN 33* 29* 33*  CREATININE 2.41* 2.16*  2.08* 2.31*  CALCIUM 8.6* 8.5* 8.2*  MG  --   --  2.1  PHOS  --   --  4.6   GFR: Estimated Creatinine Clearance: 30.8  mL/min (A) (by C-G formula based on SCr of 2.31 mg/dL (H)). Liver Function Tests: Recent Labs  Lab 01/10/21 0245  AST 16  ALT 10  ALKPHOS 91  BILITOT 1.1  PROT 6.7  ALBUMIN 2.8*   No results for input(s): LIPASE, AMYLASE in the last 168 hours. No results for input(s): AMMONIA in the last 168 hours. Coagulation Profile: No results for input(s): INR, PROTIME in the last 168 hours. Cardiac Enzymes: No results for input(s): CKTOTAL, CKMB, CKMBINDEX, TROPONINI in the last 168 hours. BNP (last 3 results) No results for input(s): PROBNP in the last 8760 hours. HbA1C: No results for input(s): HGBA1C in the last 72 hours. CBG: No results for input(s): GLUCAP in the last 168 hours. Lipid Profile: No results for input(s): CHOL, HDL, LDLCALC, TRIG, CHOLHDL, LDLDIRECT in the last 72 hours. Thyroid Function Tests: No results for input(s): TSH, T4TOTAL, FREET4, T3FREE, THYROIDAB in the last 72 hours. Anemia Panel: Recent Labs    01/09/21 0730  TIBC 220*  IRON 31*   Urine analysis:    Component Value Date/Time   COLORURINE YELLOW (A) 07/31/2017 1025   APPEARANCEUR Clear 06/14/2018 1341   LABSPEC 1.013 07/31/2017 1025   LABSPEC 1.004 04/03/2015 1216   PHURINE 5.0 07/31/2017 1025   GLUCOSEU Negative 06/14/2018 1341   GLUCOSEU Negative 04/03/2015 1216   HGBUR MODERATE (A) 07/31/2017 1025   BILIRUBINUR Negative 06/14/2018 1341   BILIRUBINUR Negative 04/03/2015 1216   KETONESUR 5 (A) 07/31/2017 1025   PROTEINUR 3+ (A) 06/14/2018 1341   PROTEINUR 100 (A) 07/31/2017 1025   UROBILINOGEN 0.2 03/27/2011 0845   NITRITE Negative 06/14/2018 1341   NITRITE NEGATIVE 07/31/2017 1025   LEUKOCYTESUR Trace (A) 06/14/2018 1341   LEUKOCYTESUR Negative 04/03/2015 1216   Sepsis Labs: @LABRCNTIP (procalcitonin:4,lacticidven:4)  ) Recent Results (from the past 240 hour(s))  SARS CORONAVIRUS 2 (TAT 6-24 HRS) Nasopharyngeal Nasopharyngeal Swab     Status: None   Collection Time: 01/08/21  3:00 PM    Specimen: Nasopharyngeal Swab  Result Value Ref Range Status   SARS Coronavirus 2 NEGATIVE NEGATIVE Final    Comment: (NOTE) SARS-CoV-2 target nucleic acids are NOT DETECTED.  The SARS-CoV-2 RNA is generally detectable in upper and lower respiratory specimens during the acute phase of infection. Negative results do not preclude SARS-CoV-2 infection, do not rule out co-infections with other pathogens, and should not be used as the sole basis for treatment or other patient management decisions. Negative results must be combined with clinical observations, patient history, and epidemiological information. The expected result is Negative.  Fact Sheet for Patients: SugarRoll.be  Fact Sheet for Healthcare Providers: https://www.woods-mathews.com/  This test is not yet approved or cleared by the [place] FDA and  has been authorized for detection and/or diagnosis of SARS-CoV-2 by FDA under an Emergency Use Authorization (EUA). This EUA will remain  in effect (meaning this test can be used) for the duration of the COVID-19 declaration under Se ction 564(b)(1) of the Act, 21 U.S.C. section 360bbb-3(b)(1), unless the authorization is terminated or revoked sooner.  Performed at  [facility], [place] [address] [place], [place] [address]       Studies: CT FOOT RIGHT WO CONTRAST  Result Date: 01/09/2021 CLINICAL DATA:  Right foot pain after fall 1 day ago EXAM: CT OF THE RIGHT FOOT WITHOUT CONTRAST TECHNIQUE: Multidetector CT imaging of the right foot was performed according to the standard protocol. Multiplanar CT image reconstructions were also generated. COMPARISON:  Right ankle x-ray 01/08/2021 FINDINGS: Bones/Joint/Cartilage No acute fracture or dislocation of the right ankle. There is a chronic healed fracture of the distal fibular metaphysis. Degenerative spurring at the distal tibiofibular joint. Chronic well corticated bony  fragments along the inferior aspect of the medial malleolus. Degenerative spurring within the medial gutter. Ankle mortise is congruent. There are a few tiny intra-articular loose bodies adjacent to the lateral talar shoulder. No tibiotalar joint effusion. Acute nondisplaced fracture of the tip of the anterior process of the calcaneus (series 8, image 59). Remainder of the calcaneus is intact. Bidirectional calcaneal enthesophytes. Subtalar joints are maintained. Bones of the midfoot are intact without fracture or malalignment. Mild joint space narrowing throughout the midfoot most pronounced at the naviculocuneiform articulation. Tarsometatarsal joints are intact with mild joint space narrowing. Severe osteoarthritis of the first MTP joint with prominent subchondral cystic changes and marginal osteophyte formation. Degenerative changes of the hallux sesamoid complex. No fracture or malalignment of the forefoot. Ligaments Suboptimally assessed by CT. Muscles and Tendons No evidence of acute musculotendinous abnormality. Soft tissues Mild soft tissue swelling at the lateral aspect of the midfoot/hindfoot. No organized fluid collection or hematoma. IMPRESSION: 1. Acute nondisplaced fracture of the tip of the anterior process of the calcaneus. 2. Severe osteoarthritis of the first MTP joint. 3. Additional chronic degenerative and posttraumatic changes, as above. Electronically Signed   By: Davina Poke D.O.   On: 01/09/2021 14:32    Scheduled Meds: . amLODipine  5 mg Oral Daily  . aspirin EC  81 mg Oral Daily  . atorvastatin  40 mg Oral Daily  . carvedilol  6.25 mg Oral BID WC  . clopidogrel  75 mg Oral Daily  . furosemide  40 mg Oral Daily  . heparin  5,000 Units Subcutaneous Q8H  . isosorbide mononitrate  30 mg Oral Daily  . QUEtiapine  50 mg Oral QHS    Continuous Infusions:   LOS: 2 days     Annita Brod, MD [facility]   01/10/2021, 12:27 PM

## 2021-01-10 NOTE — TOC Initial Note (Signed)
Transition of Care [facility]) - Initial/Assessment Note    Patient Details  Name: [name] MRN: [id number] Date of Birth: 1935/07/16  Transition of Care [facility]) CM/SW Contact:    Magnus Ivan, LCSW Phone Number: 01/10/2021, 10:47 AM  Clinical Narrative:                CSW spoke to patient at bedside. Patient lives alone. Daughter provides transportation. Patient could not recall pharmacy or PCP. Per chart, PCP is Dr. [name]. Patient uses a RW at home. Patient agreeable to SNF recommendation. Went to [facility] in the past, informed him they are not taking patients at this time. Patient with no SNF preference, wants something close by if possible. CSW starting SNF work up.   Expected Discharge Plan: Skilled Nursing Facility Barriers to Discharge: Continued Medical Work up   Patient Goals and CMS Choice Patient states their goals for this hospitalization and ongoing recovery are:: SNF rehab CMS Medicare.gov Compare Post Acute Care list provided to:: Patient Choice offered to / list presented to : Patient  Expected Discharge Plan and Services Expected Discharge Plan: [place]       Living arrangements for the past 2 months: Single Family Home                                      Prior Living Arrangements/Services Living arrangements for the past 2 months: Single Family Home Lives with:: Self Patient language and need for interpreter reviewed:: Yes Do you feel safe going back to the place where you live?: Yes      Need for Family Participation in Patient Care: Yes (Comment) Care giver support system in place?: Yes (comment) Current home services: DME Criminal Activity/Legal Involvement Pertinent to Current Situation/Hospitalization: No - Comment as needed  Activities of Daily Living Home Assistive Devices/Equipment: Scales,Blood pressure cuff,Eyeglasses ADL Screening (condition at time of admission) Patient's cognitive ability adequate to  safely complete daily activities?: No Is the patient deaf or have difficulty hearing?: No Does the patient have difficulty seeing, even when wearing glasses/contacts?: No Does the patient have difficulty concentrating, remembering, or making decisions?: No Patient able to express need for assistance with ADLs?: Yes Does the patient have difficulty dressing or bathing?: No Independently performs ADLs?: Yes (appropriate for developmental age) Does the patient have difficulty walking or climbing stairs?: No Weakness of Legs: Both Weakness of Arms/Hands: None  Permission Sought/Granted Permission sought to share information with : Chartered certified accountant granted to share information with : Yes, Verbal Permission Granted     Permission granted to share info w AGENCY: SNF  Permission granted to share info w Relationship: daughter     Emotional Assessment       Orientation: : Oriented to Self,Oriented to Place,Oriented to  Time Alcohol / Substance Use: Not Applicable Psych Involvement: No (comment)  Admission diagnosis:  COPD (chronic obstructive pulmonary disease) ([place]) [J44.9] Hip fracture ([place]) [S72.009A] Left hip pain [M25.552] Patient Active Problem List   Diagnosis Date Noted  . Hip fracture ([place]) 01/08/2021  . Primary osteoarthritis of left knee 01/06/2021  . Arthropathy of left knee 01/06/2021  . Chronic pain of left knee 01/06/2021  . Class 3 obesity with alveolar hypoventilation and body mass index (BMI) of 40.0 to 44.9 in adult ([place]) 12/23/2020  . Hip joint effusion (Left) 12/23/2020  . History of alcohol abuse 11/29/2020  .  Elevated rheumatoid factor 11/29/2020  . Polyarthritis with positive rheumatoid factor ([place]) 11/17/2020  . Elevated uric acid in blood 11/17/2020  . Stage 3b chronic kidney disease ([place]) 10/26/2020  . Proteinuria, unspecified 10/26/2020  . Elevated BUN 10/26/2020  . Elevated serum creatinine 10/26/2020  . Hypochloremia  10/26/2020  . Hypocalcemia 10/26/2020  . Hypoalbuminemia 10/26/2020  . Elevated alkaline phosphatase level 10/26/2020  . Elevated hemoglobin A1c 10/26/2020  . Elevated random blood glucose level 10/26/2020  . Anemia in stage 3b chronic kidney disease ([place]) 10/26/2020  .  Klippel-Feil deformity at the C5-C7 levels 10/26/2020  . Carotid artery calcification, bilateral 10/26/2020  . History of 2.0 cm thyroid mass (Left lobe) 10/26/2020    Class: History of  . Bilateral renal cysts 10/26/2020  . Lumbosacral spondylosis 10/26/2020  . Osteoarthritis of facet joint of lumbar spine 10/26/2020  . DDD (degenerative disc disease), lumbosacral 10/26/2020  . History of total hip replacement (Right) 10/26/2020  . Osteoarthritis of hip (Left) 10/26/2020  . Diverticulosis of sigmoid colon 10/26/2020  . Abnormal MRI, lumbar spine (10/07/2020) 10/26/2020  . Lumbar central spinal stenosis with neurogenic claudication 10/26/2020  . Lumbar foraminal stenosis 10/26/2020  . Elevated C-reactive protein (CRP) 10/25/2020  . Elevated sed rate 10/25/2020  . Vitamin D deficiency 10/25/2020  . Chronic anticoagulation (Plavix) 09/20/2020  . Chronic hip pain (1ry area of Pain) (Left) 09/20/2020  . Unilateral post-traumatic osteoarthritis of hip (Left) 09/20/2020  . Chronic knee pain (2ry area of Pain) (Bilateral) 09/20/2020  . Secondary osteoarthritis of knee (Bilateral) 09/20/2020  . Osteoarthritis of knees (Bilateral) 09/20/2020  . Chronic constipation 09/20/2020  . Chronic shoulder pain (3ry area of Pain) (Bilateral) 09/20/2020  . Grade 1 (3 mm) anterolisthesis of lumbar spine (L4-5) (stable) 09/20/2020  . Chronic groin pain (Left) 09/20/2020  . Other intervertebral disc degeneration, lumbar region 09/20/2020  . Neurogenic pain 09/20/2020  . Chronic pain syndrome 09/18/2020  . Pharmacologic therapy 09/18/2020  . Disorder of skeletal system 09/18/2020  . Problems influencing health status 09/18/2020  .  Acute respiratory failure with hypoxia ([place]) 04/12/2019  . STEMI (ST elevation myocardial infarction) ([place]) 04/01/2019  . STEMI involving right coronary artery ([place]) 04/01/2019  . Acute ST elevation myocardial infarction (STEMI) ([place]) 04/01/2019  . Lower extremity pain, bilateral 05/15/2018  . Elevated troponin I level 07/31/2017  . Hyperkalemia 05/28/2017  . Syncope 05/27/2017  . Meningioma ([place]) 01/05/2017  . GI bleed 12/01/2016  . Alcohol abuse 10/30/2016  . Acute on chronic systolic heart failure ([place]) 10/04/2016  . COPD (chronic obstructive pulmonary disease) with chronic bronchitis ([place]) 10/04/2016  . Obstructive sleep apnea 10/04/2016  . Hyponatremia 08/31/2016  . UTI (lower urinary tract infection) 08/31/2016  . Shortness of breath 02/11/2016  . Hyperlipidemia 12/03/2015  . Essential hypertension   . Coronary artery disease 01/19/2004   PCP:  Marguerita Merles, MD Pharmacy:   [place], [place] - [address] [place] [address] Phone: [phone] Fax: [phone]     Social Determinants of Health (SDOH) Interventions    Readmission Risk Interventions No flowsheet data found.

## 2021-01-10 NOTE — Progress Notes (Signed)
Occupational Therapy Treatment Patient Details Name: [name] MRN: [id number] DOB: January 11, 1935 Today's Date: 01/10/2021    History of present illness Pt is an 86 y/o M admitted on 01/08/21 with c/c of fall x 2 with BLE ankle pain & L hip pain. MRI showed possible L hip greater trochanter with suspected subtle acute nondisplaced fx. PMH: COPD, bipolar disorder, CKD3, combined systolic diastolic CHF, CAD s/p stents, HTN, HLD, OSA not complain with cpap, PVD, severe DJD of the hips & knees, alcoholism, asthma, dyspnea, prostate CA   OT comments  Upon arrival, pt.'s gown was located at the end of the bed. 61 assisted with donning his gown with minA to organize, and donn the gown. Pt. Education was provided about A/E use for LE ADLs. Pt. required modA LE dressing. Pt. Reports having several reachers at home. Pt. Intermittently closed his eyes, requiring cues to engage.  Pt. Repeatedly reported that he doesn't know where he's going next, referring to following discharge. Pt. Continues to benefit from OT services for ADL training, A/E training, and pt. education about home modification, and DME. Pt. Continues to be appropriate for SNF level of care upon discharge, with follow-up OT services.   Follow Up Recommendations  SNF    Equipment Recommendations       Recommendations for Other Services      Precautions / Restrictions Precautions Precautions: Fall Restrictions Weight Bearing Restrictions: Yes LLE Weight Bearing: Weight bearing as tolerated Other Position/Activity Restrictions: No active hip abduction (trochanter precautions)       Mobility Bed Mobility Overal bed mobility: Needs Assistance Bed Mobility: Sit to Supine;Supine to Sit       Sit to supine: Min assist      Transfers                 General transfer comment: Deferred    Balance Overall balance assessment: Needs assistance   Sitting balance-Leahy Scale: Fair                                      ADL either performed or assessed with clinical judgement   ADL                                         General ADL Comments: MinA donning gown. [place] LE dressing.     Vision Patient Visual Report: No change from baseline     Perception     Praxis      Cognition Arousal/Alertness: Awake/alert   Overall Cognitive Status: Difficult to assess                                          Exercises     Shoulder Instructions       General Comments      Pertinent Vitals/ Pain       Pain Assessment: No/denies pain  Home Living                                          Prior Functioning/Environment  Frequency  Min 1X/week        Progress Toward Goals  OT Goals(current goals can now be found in the care plan section)  Progress towards OT goals: Progressing toward goals  Acute Rehab OT Goals Patient Stated Goal: get better OT Goal Formulation: With patient Time For Goal Achievement: 01/23/21 Potential to Achieve Goals: Good  Plan Discharge plan remains appropriate    Co-evaluation                 AM-PAC OT "6 Clicks" Daily Activity     Outcome Measure   Help from another person eating meals?: None Help from another person taking care of personal grooming?: A Little Help from another person toileting, which includes using toliet, bedpan, or urinal?: A Lot Help from another person bathing (including washing, rinsing, drying)?: A Lot Help from another person to put on and taking off regular upper body clothing?: A Little Help from another person to put on and taking off regular lower body clothing?: A Lot 6 Click Score: 16    End of Session    OT Visit Diagnosis: Other abnormalities of gait and mobility (R26.89);History of falling (Z91.81)   Activity Tolerance Patient tolerated treatment well   Patient Left in bed;with call bell/phone within reach   Nurse Communication           Time: 2542-7062 OT Time Calculation (min): 15 min  Charges: OT General Charges $OT Visit: 1 Visit OT Treatments $Self Care/Home Management : 8-22 mins  Harrel Carina, MS, OTR/L    Harrel Carina 01/10/2021, 11:26 AM

## 2021-01-10 NOTE — Progress Notes (Signed)
Initial Nutrition Assessment  DOCUMENTATION CODES:   Morbid obesity  INTERVENTION:  Provide Ensure Enlive po TID, each supplement provides 350 kcal and 20 grams of protein.  Provide MVI po daily.  NUTRITION DIAGNOSIS:   Increased nutrient needs related to catabolic illness (COPD, CHF, CKD) as evidenced by estimated needs.  GOAL:   Patient will meet greater than or equal to 90% of their needs  MONITOR:   PO intake,Supplement acceptance,Labs,Weight trends,I & O's  REASON FOR ASSESSMENT:   Consult Assessment of nutrition requirement/status  ASSESSMENT:   86 year old male with PMHx of COPD, HLD, EtOH abuse, bipolar affective disorder, CAD, asthma, CHF, CKD stage III, HTN, OSA admitted with right anterior calcaneus fracture (recommendation is for walking boot), chronic hip pain.   Met with patient at bedside. Difficult to obtain history as he kept falling asleep during assessment. Patient reports to RD his appetite is fine and he is eating well. However per chart patient ate 0% of breakfast and only bites of lunch. Patient unable to provide any details on usual intake at baseline.  Patient denies any weight loss. Per review of chart patient documented to be 125.6 kg (277 lbs). He was 102.1 kg on 09/20/2020.  Medications reviewed and include: Lasix 40 mg daily.  Labs reviewed: BUN 33, Creatinine 2.31.  Patient does not meet criteria for malnutrition at this time.   NUTRITION - FOCUSED PHYSICAL EXAM:  Flowsheet Row Most Recent Value  Orbital Region No depletion  Upper Arm Region No depletion  Thoracic and Lumbar Region No depletion  Buccal Region No depletion  Temple Region No depletion  Clavicle Bone Region No depletion  Clavicle and Acromion Bone Region Mild depletion  Scapular Bone Region No depletion  Dorsal Hand No depletion  Patellar Region No depletion  Anterior Thigh Region No depletion  Posterior Calf Region No depletion  Edema (RD Assessment) None  Hair  Reviewed  Eyes Unable to assess  Mouth Unable to assess  Skin Reviewed  Nails Reviewed     Diet Order:   Diet Order            Diet Heart Room service appropriate? Yes; Fluid consistency: Thin  Diet effective now                EDUCATION NEEDS:   No education needs have been identified at this time  Skin:  Skin Assessment: Reviewed RN Assessment  Last BM:  01/08/2021 per chart  Height:   Ht Readings from Last 1 Encounters:  01/08/21 5' 7" (1.702 m)   Weight:   Wt Readings from Last 1 Encounters:  01/08/21 125.6 kg   BMI:  Body mass index is 43.38 kg/m.  Estimated Nutritional Needs:   Kcal:  2300-2500  Protein:  >125 grams  Fluid:  2 L/day  Jacklynn Barnacle, MS, RD, LDN Pager number available on Amion

## 2021-01-11 DIAGNOSIS — G8929 Other chronic pain: Secondary | ICD-10-CM

## 2021-01-11 DIAGNOSIS — N1832 Chronic kidney disease, stage 3b: Secondary | ICD-10-CM

## 2021-01-11 DIAGNOSIS — D631 Anemia in chronic kidney disease: Secondary | ICD-10-CM

## 2021-01-11 MED ORDER — UMECLIDINIUM BROMIDE 62.5 MCG/INH IN AEPB
1.0000 | INHALATION_SPRAY | Freq: Every day | RESPIRATORY_TRACT | Status: DC
  Administered 2021-01-11 – 2021-01-13 (×3): 1 via RESPIRATORY_TRACT
  Filled 2021-01-11: qty 7

## 2021-01-11 MED ORDER — FLUTICASONE FUROATE-VILANTEROL 100-25 MCG/INH IN AEPB
1.0000 | INHALATION_SPRAY | Freq: Every day | RESPIRATORY_TRACT | Status: DC
  Administered 2021-01-11 – 2021-01-13 (×3): 1 via RESPIRATORY_TRACT
  Filled 2021-01-11: qty 28

## 2021-01-11 NOTE — TOC Progression Note (Addendum)
Transition of Care [facility]) - Progression Note    Patient Details  Name: [name] MRN: [id number] Date of Birth: [birth year]  Transition of Care [facility]) CM/SW [place], LCSW Phone Number: 01/11/2021, 10:53 AM  Clinical Narrative:     CSW met with patient, informed him of 1 bed offer at [facility]. Patient agreeable to [employer]. Spoke to [place] in Admissions to accept bed offer. [facility] if he is vaccinated for COVID and if he has rehab bed days. [facility] is running benefits check. Patient reported he has had both COVID vaccines and has the card in his wallet. CSW left VM for Tammy at [facility] to start auth. Also left VM for patient's daughter requesting a return call.  11:05- Return call from [place] with [employer], requested authorization. Informed Tammy patient is medically ready once we get auth for SNF and EMS.   1:55[facility] at [facility] can take patient tomorrow.  2:55- Return call from patient's daughter. Provided update that patient will go to [facility], she agrees with this plan. She confirmed patient has had his COVID vaccines and booster.   Expected Discharge Plan: [place] Barriers to Discharge: Continued Medical Work up  Expected Discharge Plan and Services Expected Discharge Plan: [place] arrangements for the past 2 months: Single Family Home                                       Social Determinants of Health (SDOH) Interventions    Readmission Risk Interventions No flowsheet data found.

## 2021-01-11 NOTE — Progress Notes (Signed)
PROGRESS NOTE    [name]  QHU:[id number] DOB: [birth year] DOA: 01/08/2021 PCP: Marguerita Merles, MD   Brief Narrative: Taken from prior notes. 86 year old male with past medical history of COPD, bipolar disorder, stage IV chronic kidney disease, chronic systolic CHF plus CAD and sleep apnea and PVD admitted on 1/22 after coming to the emergency room with 2 falls in the past few days with persistent bilateral ankle pain, left hip pain and inability to stand. In the emergency room, MRI noted questionable left hip/left greater trochanter with suspected subtle acute nondisplaced fracture. Orthopedic surgery consulted and it was felt that it was not confirmed that his left hip had indeed a operable fracture. Is possible some of this may be more from chronic pain and DJD plus bursitis. It was recommended weightbearing and limiting hip abduction. However, orthopedic surgery also evaluated patient's right foot and a CT scan noted a nondisplaced right anterior calcaneal fracture. Walking boot recommended and PT evaluation done which recommended skilled nursing.  PT recommended SNF placement-had 1 bed offer today, pending insurance authorization.  Covid testing ordered.  Subjective: Patient wants to sleep when seen this morning, stating that he could not sleep well last night and wants to get some rest.  Denies any complaint.  Breakfast was sitting next to his bed and not being touched.  When asked he said he is not hungry yet and requesting to let him sleep.  Assessment & Plan:   Principal Problem:   Right calcaneal fracture Active Problems:   Essential hypertension   COPD (chronic obstructive pulmonary disease) with chronic bronchitis (HCC)   Obstructive sleep apnea   Chronic hip pain (1ry area of Pain) (Left)   CKD (chronic kidney disease), stage IV (HCC)   Anemia in stage 3b chronic kidney disease (HCC)   Morbid obesity (HCC)   PVD (peripheral vascular disease) (HCC)   Chronic systolic CHF  (congestive heart failure) (HCC)  Right calcaneal fracture/left nondisplaced questionable hip fracture. Orthopedic was consulted and they are recommending conservative management. Right boot is in place.  And he is weightbearing on left.  They are recommending limited abduction due to some intramuscular edema reflecting muscle strain in that area. PT recommending SNF placement-had 1 bed of her today, pending insurance authorization.  OSA.  Patient is noncompliant with CPAP. -CPAP at night.  Essential hypertension.  Blood pressure elevated today. Patient is on Lasix, Imdur, Coreg and amlodipine. Monitor for today if remains elevated we will increase the dose of amlodipine.  COPD.  History of chronic bronchitis.  Currently stable, no acute concern. -Continue with as needed bronchodilators  CKD stage IV with chronic anemia.  Seems stable and at baseline.  Stage III obesity. Body mass index is 43.38 kg/m.  Meets criteria for stage III obesity.  This will complicate overall prognosis.  History of peripheral vascular disease. -Continue with aspirin and Plavix.  Chronic HFrEF.  Appears euvolemic -Continue home dose of Coreg, Imdur and Lasix.  Objective: Vitals:   01/10/21 2120 01/11/21 0031 01/11/21 0526 01/11/21 0800  BP: (!) 135/55 (!) 155/83 (!) 172/75 (!) 178/98  Pulse: 81 77 71 66  Resp: 16 17 17    Temp: 98.6 F (37 C) 98.3 F (36.8 C) 97.9 F (36.6 C)   TempSrc: Oral Oral Oral   SpO2: 98% 94% 96%   Weight:      Height:        Intake/Output Summary (Last 24 hours) at 01/11/2021 1433 Last data filed at 01/11/2021 0536 Gross  per 24 hour  Intake 0 ml  Output 200 ml  Net -200 ml   Filed Weights   01/08/21 0618  Weight: 125.6 kg    Examination:  General exam: Appears calm and comfortable  Respiratory system: Clear to auscultation. Respiratory effort normal. Cardiovascular system: S1 & S2 heard, RRR. Gastrointestinal system: Soft, nontender, nondistended, bowel  sounds positive. Central nervous system: Alert and oriented. No focal neurological deficits. Extremities: Left foot with boot in place, no edema, pulses intact and symmetrical. Psychiatry: Judgement and insight appear normal.     DVT prophylaxis: Heparin Code Status: Full Family Communication: Call daughter with no response Disposition Plan:  Status is: Inpatient  Remains inpatient appropriate because:Inpatient level of care appropriate due to severity of illness   Dispo: The patient is from: Home              Anticipated d/c is to: SNF              Anticipated d/c date is: 1 day              Patient currently is medically stable to d/c.   Difficult to place patient No   Consultants:   Orthopedic surgery  Procedures:  Antimicrobials:   Data Reviewed: I have personally reviewed following labs and imaging studies  CBC: Recent Labs  Lab 01/08/21 1148 01/09/21 0730 01/10/21 0245  WBC 8.6 8.3 6.8  NEUTROABS 5.9  --  4.5  HGB 10.7* 10.2* 9.8*  HCT 32.8* 31.0* 29.0*  MCV 86.3 85.9 84.5  PLT 209 215 254   Basic Metabolic Panel: Recent Labs  Lab 01/08/21 1148 01/09/21 0730 01/10/21 0245  NA 140 140 140  K 3.7 4.1 3.8  CL 101 106 106  CO2 25 25 24   GLUCOSE 106* 106* 98  BUN 33* 29* 33*  CREATININE 2.41* 2.16*  2.08* 2.31*  CALCIUM 8.6* 8.5* 8.2*  MG  --   --  2.1  PHOS  --   --  4.6   GFR: Estimated Creatinine Clearance: 30.8 mL/min (A) (by C-G formula based on SCr of 2.31 mg/dL (H)). Liver Function Tests: Recent Labs  Lab 01/10/21 0245  AST 16  ALT 10  ALKPHOS 91  BILITOT 1.1  PROT 6.7  ALBUMIN 2.8*   No results for input(s): LIPASE, AMYLASE in the last 168 hours. No results for input(s): AMMONIA in the last 168 hours. Coagulation Profile: No results for input(s): INR, PROTIME in the last 168 hours. Cardiac Enzymes: No results for input(s): CKTOTAL, CKMB, CKMBINDEX, TROPONINI in the last 168 hours. BNP (last 3 results) No results for input(s):  PROBNP in the last 8760 hours. HbA1C: No results for input(s): HGBA1C in the last 72 hours. CBG: No results for input(s): GLUCAP in the last 168 hours. Lipid Profile: No results for input(s): CHOL, HDL, LDLCALC, TRIG, CHOLHDL, LDLDIRECT in the last 72 hours. Thyroid Function Tests: No results for input(s): TSH, T4TOTAL, FREET4, T3FREE, THYROIDAB in the last 72 hours. Anemia Panel: Recent Labs    01/09/21 0730  TIBC 220*  IRON 31*   Sepsis Labs: No results for input(s): PROCALCITON, LATICACIDVEN in the last 168 hours.  Recent Results (from the past 240 hour(s))  SARS CORONAVIRUS 2 (TAT 6-24 HRS) Nasopharyngeal Nasopharyngeal Swab     Status: None   Collection Time: 01/08/21  3:00 PM   Specimen: Nasopharyngeal Swab  Result Value Ref Range Status   SARS Coronavirus 2 NEGATIVE NEGATIVE Final    Comment: (NOTE) SARS-CoV-2 target  nucleic acids are NOT DETECTED.  The SARS-CoV-2 RNA is generally detectable in upper and lower respiratory specimens during the acute phase of infection. Negative results do not preclude SARS-CoV-2 infection, do not rule out co-infections with other pathogens, and should not be used as the sole basis for treatment or other patient management decisions. Negative results must be combined with clinical observations, patient history, and epidemiological information. The expected result is Negative.  Fact Sheet for Patients: SugarRoll.be  Fact Sheet for Healthcare Providers: https://www.woods-mathews.com/  This test is not yet approved or cleared by the [place] FDA and  has been authorized for detection and/or diagnosis of SARS-CoV-2 by FDA under an Emergency Use Authorization (EUA). This EUA will remain  in effect (meaning this test can be used) for the duration of the COVID-19 declaration under Se ction 564(b)(1) of the Act, 21 U.S.C. section 360bbb-3(b)(1), unless the authorization is terminated  or revoked sooner.  Performed at [facility], [place] [address] [place], [place] [address]      Radiology Studies: No results found.  Scheduled Meds: . amLODipine  5 mg Oral Daily  . aspirin EC  81 mg Oral Daily  . atorvastatin  40 mg Oral Daily  . carvedilol  6.25 mg Oral BID WC  . clopidogrel  75 mg Oral Daily  . feeding supplement  237 mL Oral TID BM  . fluticasone furoate-vilanterol  1 puff Inhalation Daily   And  . umeclidinium bromide  1 puff Inhalation Daily  . furosemide  40 mg Oral Daily  . heparin  5,000 Units Subcutaneous Q8H  . isosorbide mononitrate  30 mg Oral Daily  . multivitamin with minerals  1 tablet Oral Daily  . QUEtiapine  50 mg Oral QHS   Continuous Infusions:   LOS: 3 days   Time spent: 30 minutes.  Lorella Nimrod, MD [facility]  If 7PM-7AM, please contact night-coverage [email or web address]  01/11/2021, 2:33 PM   This record has been created using Systems analyst. Errors have been sought and corrected,but may not always be located. Such creation errors do not reflect on the standard of care.

## 2021-01-11 NOTE — Progress Notes (Signed)
PT Cancellation Note  Patient Details Name: [name] MRN: [id number] DOB: 07/31/1935   Cancelled Treatment:      PT attempt. Pr 3 x total today. Pt did not eat breakfast or lunch and becomes very agitated when therapist attempted to motivate pt to participate." Why wont you just leave me alone?! I'm suppose to leave today." Acute PT will continue efforts to progress as able per POC.    Willette Pa 01/11/2021, 3:25 PM

## 2021-01-11 NOTE — Progress Notes (Signed)
PT Cancellation Note  Patient Details Name: [name] MRN: [id number] DOB: Aug 05, 1935   Cancelled Treatment:     Pt was asleep upon entering room. Easily awakes however quickly becomes agitated/frustrated with author for attempting to treat. BP elevated at 164/91. Will return later when pt is more cooperative and willing to participate.   Willette Pa 01/11/2021, 9:40 AM

## 2021-01-12 ENCOUNTER — Encounter: Payer: Self-pay | Admitting: Licensed Clinical Social Worker

## 2021-01-12 ENCOUNTER — Ambulatory Visit (INDEPENDENT_AMBULATORY_CARE_PROVIDER_SITE_OTHER): Payer: HMO | Admitting: Licensed Clinical Social Worker

## 2021-01-12 DIAGNOSIS — F0631 Mood disorder due to known physiological condition with depressive features: Secondary | ICD-10-CM

## 2021-01-12 LAB — SARS CORONAVIRUS 2 (TAT 6-24 HRS): SARS Coronavirus 2: NEGATIVE

## 2021-01-12 MED ORDER — GABAPENTIN 300 MG PO CAPS
300.0000 mg | ORAL_CAPSULE | Freq: Two times a day (BID) | ORAL | Status: DC
  Administered 2021-01-12 – 2021-01-13 (×3): 300 mg via ORAL
  Filled 2021-01-12 (×3): qty 1

## 2021-01-12 NOTE — Progress Notes (Signed)
Patient had blood clot from nose this shift, No blood on face. States " it came from my nose. " Dr. Reesa Chew aware . No new orders at this time

## 2021-01-12 NOTE — Care Management Important Message (Signed)
Important Message  Patient Details  Name: [name] MRN: [id number] Date of Birth: Sep 15, 1935   Medicare Important Message Given:  Yes     Juliann Pulse A Meha Vidrine 01/12/2021, 10:50 AM

## 2021-01-12 NOTE — Progress Notes (Signed)
PROGRESS NOTE    [name]  URK:[id number] DOB: [birth year] DOA: 01/08/2021 PCP: Marguerita Merles, MD   Brief Narrative: Taken from prior notes. 86 year old male with past medical history of COPD, bipolar disorder, stage IV chronic kidney disease, chronic systolic CHF plus CAD and sleep apnea and PVD admitted on 1/22 after coming to the emergency room with 2 falls in the past few days with persistent bilateral ankle pain, left hip pain and inability to stand. In the emergency room, MRI noted questionable left hip/left greater trochanter with suspected subtle acute nondisplaced fracture. Orthopedic surgery consulted and it was felt that it was not confirmed that his left hip had indeed a operable fracture. Is possible some of this may be more from chronic pain and DJD plus bursitis. It was recommended weightbearing and limiting hip abduction. However, orthopedic surgery also evaluated patient's right foot and a CT scan noted a nondisplaced right anterior calcaneal fracture. Walking boot recommended and PT evaluation done which recommended skilled nursing.  PT recommended SNF placement-had 1 bed offer today, pending insurance authorization.  Covid testing ordered. Insurance authorization required peer to peer review today which was done and Dr. Amalia Hailey gave verbal approval.  Waiting for final documentation. They were concerned about possible alcohol withdrawal, patient does not exhibit any sign of alcohol withdrawal, do become at times agitated which is very common in this age group, most likely some underlying dementia and sundowning.  Subjective: Patient was sitting in chair and drinking Ensure when seen today.  No new complaint.  There was some nursing concern that he did not eat well yesterday.  Appears appropriate.  Assessment & Plan:   Principal Problem:   Right calcaneal fracture Active Problems:   Essential hypertension   COPD (chronic obstructive pulmonary disease) with chronic bronchitis  (HCC)   Obstructive sleep apnea   Chronic hip pain (1ry area of Pain) (Left)   CKD (chronic kidney disease), stage IV (HCC)   Anemia in stage 3b chronic kidney disease (HCC)   Morbid obesity (HCC)   PVD (peripheral vascular disease) (HCC)   Chronic systolic CHF (congestive heart failure) (HCC)  Right calcaneal fracture/left nondisplaced questionable hip fracture. Orthopedic was consulted and they are recommending conservative management. Right boot is in place.  And he is weightbearing on left.  They are recommending limited abduction due to some intramuscular edema reflecting muscle strain in that area. PT recommending SNF placement-had 1 bed offer, he required peer to peer review for insurance authorization which was done and Dr. Amalia Hailey verbally said okay, waiting for final approval.  OSA.  Patient is noncompliant with CPAP. -CPAP at night.  Essential hypertension.  Blood pressure elevated today. Patient is on Lasix, Imdur, Coreg and amlodipine. Monitor for today if remains elevated we will increase the dose of amlodipine.  COPD.  History of chronic bronchitis.  Currently stable, no acute concern. -Continue with as needed bronchodilators  CKD stage IV with chronic anemia.  Seems stable and at baseline.  Stage III obesity. Body mass index is 43.38 kg/m.  Meets criteria for stage III obesity.  This will complicate overall prognosis.  History of peripheral vascular disease. -Continue with aspirin and Plavix.  Chronic HFrEF.  Appears euvolemic -Continue home dose of Coreg, Imdur and Lasix.  Objective: Vitals:   01/12/21 0416 01/12/21 0739 01/12/21 1120 01/12/21 1507  BP: 136/67 (!) 153/59 (!) 106/54 (!) 119/54  Pulse: 64 71 65 (!) 53  Resp: 17 16 18 18   Temp: 97.7 F (36.5 C)  98.1 F (36.7 C) 97.9 F (36.6 C)   TempSrc: Oral     SpO2: 94% 100% 100% 98%  Weight:      Height:        Intake/Output Summary (Last 24 hours) at 01/12/2021 1715 Last data filed at 01/12/2021  1409 Gross per 24 hour  Intake 477 ml  Output 300 ml  Net 177 ml   Filed Weights   01/08/21 0618  Weight: 125.6 kg    Examination:  General.  Well-developed morbidly obese elderly man, in no acute distress. Pulmonary.  Lungs clear bilaterally, normal respiratory effort. CV.  Regular rate and rhythm, no JVD, rub or murmur. Abdomen.  Soft, nontender, nondistended, BS positive. CNS.  Alert and oriented x3.  No focal neurologic deficit. Extremities.  No edema, no cyanosis, pulses intact and symmetrical, right foot with boot. Psychiatry.  Judgment and insight appears normal.  DVT prophylaxis: Heparin Code Status: Full Family Communication: Call daughter with no response Disposition Plan:  Status is: Inpatient  Remains inpatient appropriate because:Inpatient level of care appropriate due to severity of illness   Dispo: The patient is from: Home              Anticipated d/c is to: SNF              Anticipated d/c date is: 1 day              Patient currently is medically stable to d/c.   Difficult to place patient No   Consultants:   Orthopedic surgery  Procedures:  Antimicrobials:   Data Reviewed: I have personally reviewed following labs and imaging studies  CBC: Recent Labs  Lab 01/08/21 1148 01/09/21 0730 01/10/21 0245  WBC 8.6 8.3 6.8  NEUTROABS 5.9  --  4.5  HGB 10.7* 10.2* 9.8*  HCT 32.8* 31.0* 29.0*  MCV 86.3 85.9 84.5  PLT 209 215 962   Basic Metabolic Panel: Recent Labs  Lab 01/08/21 1148 01/09/21 0730 01/10/21 0245  NA 140 140 140  K 3.7 4.1 3.8  CL 101 106 106  CO2 25 25 24   GLUCOSE 106* 106* 98  BUN 33* 29* 33*  CREATININE 2.41* 2.16*  2.08* 2.31*  CALCIUM 8.6* 8.5* 8.2*  MG  --   --  2.1  PHOS  --   --  4.6   GFR: Estimated Creatinine Clearance: 30.8 mL/min (A) (by C-G formula based on SCr of 2.31 mg/dL (H)). Liver Function Tests: Recent Labs  Lab 01/10/21 0245  AST 16  ALT 10  ALKPHOS 91  BILITOT 1.1  PROT 6.7  ALBUMIN  2.8*   No results for input(s): LIPASE, AMYLASE in the last 168 hours. No results for input(s): AMMONIA in the last 168 hours. Coagulation Profile: No results for input(s): INR, PROTIME in the last 168 hours. Cardiac Enzymes: No results for input(s): CKTOTAL, CKMB, CKMBINDEX, TROPONINI in the last 168 hours. BNP (last 3 results) No results for input(s): PROBNP in the last 8760 hours. HbA1C: No results for input(s): HGBA1C in the last 72 hours. CBG: No results for input(s): GLUCAP in the last 168 hours. Lipid Profile: No results for input(s): CHOL, HDL, LDLCALC, TRIG, CHOLHDL, LDLDIRECT in the last 72 hours. Thyroid Function Tests: No results for input(s): TSH, T4TOTAL, FREET4, T3FREE, THYROIDAB in the last 72 hours. Anemia Panel: No results for input(s): VITAMINB12, FOLATE, FERRITIN, TIBC, IRON, RETICCTPCT in the last 72 hours. Sepsis Labs: No results for input(s): PROCALCITON, LATICACIDVEN in the last 168 hours.  Recent Results (from the past 240 hour(s))  SARS CORONAVIRUS 2 (TAT 6-24 HRS) Nasopharyngeal Nasopharyngeal Swab     Status: None   Collection Time: 01/08/21  3:00 PM   Specimen: Nasopharyngeal Swab  Result Value Ref Range Status   SARS Coronavirus 2 NEGATIVE NEGATIVE Final    Comment: (NOTE) SARS-CoV-2 target nucleic acids are NOT DETECTED.  The SARS-CoV-2 RNA is generally detectable in upper and lower respiratory specimens during the acute phase of infection. Negative results do not preclude SARS-CoV-2 infection, do not rule out co-infections with other pathogens, and should not be used as the sole basis for treatment or other patient management decisions. Negative results must be combined with clinical observations, patient history, and epidemiological information. The expected result is Negative.  Fact Sheet for Patients: SugarRoll.be  Fact Sheet for Healthcare Providers: https://www.woods-mathews.com/  This test is  not yet approved or cleared by the [place] FDA and  has been authorized for detection and/or diagnosis of SARS-CoV-2 by FDA under an Emergency Use Authorization (EUA). This EUA will remain  in effect (meaning this test can be used) for the duration of the COVID-19 declaration under Se ction 564(b)(1) of the Act, 21 U.S.C. section 360bbb-3(b)(1), unless the authorization is terminated or revoked sooner.  Performed at [facility], [employer] [address], [place], [place] [address]   SARS CORONAVIRUS 2 (TAT 6-24 HRS) Nasopharyngeal Nasopharyngeal Swab     Status: None   Collection Time: 01/11/21  4:15 PM   Specimen: Nasopharyngeal Swab  Result Value Ref Range Status   SARS Coronavirus 2 NEGATIVE NEGATIVE Final    Comment: (NOTE) SARS-CoV-2 target nucleic acids are NOT DETECTED.  The SARS-CoV-2 RNA is generally detectable in upper and lower respiratory specimens during the acute phase of infection. Negative results do not preclude SARS-CoV-2 infection, do not rule out co-infections with other pathogens, and should not be used as the sole basis for treatment or other patient management decisions. Negative results must be combined with clinical observations, patient history, and epidemiological information. The expected result is Negative.  Fact Sheet for Patients: SugarRoll.be  Fact Sheet for Healthcare Providers: https://www.woods-mathews.com/  This test is not yet approved or cleared by the [place] FDA and  has been authorized for detection and/or diagnosis of SARS-CoV-2 by FDA under an Emergency Use Authorization (EUA). This EUA will remain  in effect (meaning this test can be used) for the duration of the COVID-19 declaration under Se ction 564(b)(1) of the Act, 21 U.S.C. section 360bbb-3(b)(1), unless the authorization is terminated or revoked sooner.  Performed at [facility], [place] [address], [place],  [place] [address]      Radiology Studies: No results found.  Scheduled Meds: . amLODipine  5 mg Oral Daily  . aspirin EC  81 mg Oral Daily  . atorvastatin  40 mg Oral Daily  . carvedilol  6.25 mg Oral BID WC  . clopidogrel  75 mg Oral Daily  . feeding supplement  237 mL Oral TID BM  . fluticasone furoate-vilanterol  1 puff Inhalation Daily   And  . umeclidinium bromide  1 puff Inhalation Daily  . furosemide  40 mg Oral Daily  . gabapentin  300 mg Oral BID  . heparin  5,000 Units Subcutaneous Q8H  . isosorbide mononitrate  30 mg Oral Daily  . multivitamin with minerals  1 tablet Oral Daily  . QUEtiapine  50 mg Oral QHS   Continuous Infusions:   LOS: 4 days  Time spent: 25 minutes.  Lorella Nimrod, MD [facility]  If 7PM-7AM, please contact night-coverage [email or web address]  01/12/2021, 5:15 PM   This record has been created using Systems analyst. Errors have been sought and corrected,but may not always be located. Such creation errors do not reflect on the standard of care.

## 2021-01-12 NOTE — Progress Notes (Signed)
Virtual Visit via Telephone Note  I connected with [name] on 01/12/21 at  2:00 PM EST by telephone and verified that I am speaking with the correct person using two identifiers.  Participating Parties Patient Provider  Location: Patient: Reported he is at home, although medical chart indicates he is currently admitted to hospital for hip fracture since 01/08/21 with no update about discharging home at time of this visit.  Provider: Home Office   I discussed the limitations, risks, security and privacy concerns of performing an evaluation and management service by telephone and the availability of in person appointments. I also discussed with the patient that there may be a patient responsible charge related to this service. The patient expressed understanding and agreed to proceed.  THERAPY PROGRESS NOTE  Session Time: 5 Minutes  Participation Level: Minimal  Behavioral Response: DrowsyTired  Type of Therapy: Individual Therapy  Treatment Goals addressed: Coping  Interventions: Supportive  Summary: [name] is a 86 y.o. male who presents with depression sxs. Prior to visit, therapist reviewed patient's chart. Chart shows patient is currently admitted to the hospital and has been there since 01/08/21 with no update about discharging home. Pt answered the telephone. Pt denied he was in the hospital and reported he was discharged "sometime yesterday". Pt reported he is feeling "okay" and confirmed he was taking a lot of pain medication after experiencing a recent fall w/ difficulty ambulating. Pt gave minimal responses to therapist assessment of current situation.   Suicidal/Homicidal: No  Therapist Response: Therapist called patient to check-in expecting to leave message to reschedule today's appointment as chart indicates patient is currently admitted to hospital, however, patient answered. Therapist could hear what sounded like loud snoring and asked if patient wanted to  reschedule to get some rest. Pt agreed. Appointment was rescheduled for 2/3 at 10am.  Plan: Return again in 1 week.  Diagnosis: Axis I: Depression due to physical illness    Axis II: N/A  Josephine Igo, LCSW, LCAS 01/12/2021

## 2021-01-12 NOTE — Progress Notes (Signed)
Physical Therapy Treatment Patient Details Name: [name] MRN: [id number] DOB: 1935/02/15 Today's Date: 01/12/2021    History of Present Illness Pt is an 86 y/o M admitted on 01/08/21 with c/c of fall x 2 with BLE ankle pain & L hip pain. MRI showed possible L hip greater trochanter with suspected subtle acute nondisplaced fx. PMH: COPD, bipolar disorder, CKD3, combined systolic diastolic CHF, CAD s/p stents, HTN, HLD, OSA not complain with cpap, PVD, severe DJD of the hips & knees, alcoholism, asthma, dyspnea, prostate CA    PT Comments    Pt was supine (flat) in bed upon arriving. He keeps eyes closed throughout most of session. Pt becomes  Agitated quickly and throws himself OOB to EOB with little regard to safety. Pt was tangled up in blankets and needs max cues to slow down and relax. He then proceeded to impulsively stand to RW and quickly take steps to recliner prior to [employer] requesting him to. He then stood 2 x from recliner to RW. Unwilling to ambulate further distances. Pt yells out," Why can't you just leave me alone!"  Discontinued session. He was in recliner with chair alarm in place and call bell in reach. RN staff notified of pt's safety concerns. Recommend Pt have 24 hour assistance at DC.     Follow Up Recommendations  SNF;Home health PT;Supervision/Assistance - 24 hour;Supervision for mobility/OOB;Other (comment) (difficulty to assess due to pt's willingness)     Equipment Recommendations  3in1 (PT);Rolling walker with 5" wheels    Recommendations for Other Services       Precautions / Restrictions Precautions Precautions: Fall Required Braces or Orthoses:  (CAM boot RLE) Other Brace: RLE walking boot Restrictions Weight Bearing Restrictions: Yes RLE Weight Bearing: Weight bearing as tolerated    Mobility  Bed Mobility Overal bed mobility: Modified Independent Bed Mobility: Supine to Sit     Supine to sit: Supervision     General bed mobility comments:  bed height flat. Pt impulsively comes to sitting EOB before blankets/sheet were removed. Throws blamkets off himself and quickly stands. Pt required no physical assistance.  Transfers Overall transfer level: Needs assistance Equipment used: Rolling walker (2 wheeled) Transfers: Sit to/from Stand Sit to Stand: Supervision         General transfer comment: Pt impulsively stands to RW and quickly ambulated to recliner. Poor safety awareness due to agitation.  Ambulation/Gait Ambulation/Gait assistance: Supervision Gait Distance (Feet): 5 Feet Assistive device: Rolling walker (2 wheeled) Gait Pattern/deviations: Step-through pattern;Trunk flexed Gait velocity: impulsively fast   General Gait Details: difficulty to assess 2/2 to pt's agitation and limited gait distance. pt unwilling to continue session after getting to chair.       Balance Overall balance assessment: Needs assistance Sitting-balance support: Bilateral upper extremity supported;Feet supported Sitting balance-Leahy Scale: Good Sitting balance - Comments: supervision   Standing balance support: Bilateral upper extremity supported Standing balance-Leahy Scale: Fair Standing balance comment: BUE support on RW         Cognition Arousal/Alertness: Lethargic Behavior During Therapy: Agitated;Impulsive Overall Cognitive Status: Difficult to assess        General Comments: Pt was supine in bed awake with eyes closed upon arriving. He quickly gets very agitated and frustrated with very minimal conversation. Pt becomes very angry with Chief Strategy [employer].             Pertinent Vitals/Pain Pain Assessment: No/denies pain Pain Score: 0-No pain           PT Goals (  current goals can now be found in the care plan section) Acute Rehab PT Goals Patient Stated Goal: none stated Progress towards PT goals: Progressing toward goals    Frequency    7X/week      PT Plan Current plan remains appropriate       AM-PAC PT  "6 Clicks" Mobility   Outcome Measure  Help needed turning from your back to your side while in a flat bed without using bedrails?: None Help needed moving from lying on your back to sitting on the side of a flat bed without using bedrails?: None Help needed moving to and from a bed to a chair (including a wheelchair)?: A Little Help needed standing up from a chair using your arms (e.g., wheelchair or bedside chair)?: A Little Help needed to walk in hospital room?: A Little Help needed climbing 3-5 steps with a railing? : A Little 6 Click Score: 20    End of Session Equipment Utilized During Treatment: Other (comment) (unwilling to allow gait belt) Activity Tolerance: Treatment limited secondary to agitation (pt very agitated throughout session. Impulsive due to being agitated) Patient left: in chair;with call bell/phone within reach;with chair alarm set Nurse Communication: Mobility status PT Visit Diagnosis: Difficulty in walking, not elsewhere classified (R26.2);Muscle weakness (generalized) (M62.81);History of falling (Z91.81)     Time: 1010-1025 PT Time Calculation (min) (ACUTE ONLY): 15 min  Charges:  $Therapeutic Activity: 8-22 mins                     Julaine Fusi PTA 01/12/21, 10:57 AM

## 2021-01-12 NOTE — TOC Progression Note (Signed)
Transition of Care [facility]) - Progression Note    Patient Details  Name: [name] MRN: [id number] Date of Birth: 12-Apr-1935  Transition of Care [facility]) CM/SW [place], RN Phone Number: 01/12/2021, 8:34 AM  Clinical Narrative:   RNCM reached out to Tammy with [employer] to check on authorization. Tammy reports that the initial authorization for SNF has been denied but that a peer to peer has been offered. The MD can reach out to Dr. Amalia Hailey at [phone] and needs to be done by noon on 1/27.    Expected Discharge Plan: [place] Barriers to Discharge: Continued Medical Work up  Expected Discharge Plan and Services Expected Discharge Plan: [place] arrangements for the past 2 months: Single Family Home                                       Social Determinants of Health (SDOH) Interventions    Readmission Risk Interventions No flowsheet data found.

## 2021-01-13 DIAGNOSIS — I10 Essential (primary) hypertension: Secondary | ICD-10-CM

## 2021-01-13 LAB — BASIC METABOLIC PANEL
Anion gap: 12 (ref 5–15)
BUN: 34 mg/dL — ABNORMAL HIGH (ref 8–23)
CO2: 21 mmol/L — ABNORMAL LOW (ref 22–32)
Calcium: 8.5 mg/dL — ABNORMAL LOW (ref 8.9–10.3)
Chloride: 107 mmol/L (ref 98–111)
Creatinine, Ser: 2.16 mg/dL — ABNORMAL HIGH (ref 0.61–1.24)
GFR, Estimated: 30 mL/min — ABNORMAL LOW (ref >60)
Glucose, Bld: 93 mg/dL (ref 70–99)
Potassium: 4.3 mmol/L (ref 3.5–5.1)
Sodium: 140 mmol/L (ref 135–145)

## 2021-01-13 LAB — CBC
HCT: 31.4 % — ABNORMAL LOW (ref 39.0–52.0)
Hemoglobin: 10.4 g/dL — ABNORMAL LOW (ref 13.0–17.0)
MCH: 28 pg (ref 26.0–34.0)
MCHC: 33.1 g/dL (ref 30.0–36.0)
MCV: 84.6 fL (ref 80.0–100.0)
Platelets: 210 10*3/uL (ref 150–400)
RBC: 3.71 MIL/uL — ABNORMAL LOW (ref 4.22–5.81)
RDW: 15.1 % (ref 11.5–15.5)
WBC: 6 10*3/uL (ref 4.0–10.5)
nRBC: 0 % (ref 0.0–0.2)

## 2021-01-13 MED ORDER — HYDROCODONE-ACETAMINOPHEN 5-325 MG PO TABS
1.0000 | ORAL_TABLET | Freq: Four times a day (QID) | ORAL | 0 refills | Status: DC | PRN
Start: 2021-01-13 — End: 2021-02-17

## 2021-01-13 MED ORDER — ADULT MULTIVITAMIN W/MINERALS CH: 1.0000 | ORAL_TABLET | Freq: Every day | ORAL | Status: DC

## 2021-01-13 MED ORDER — ENSURE ENLIVE PO LIQD: 237.0000 mL | Freq: Three times a day (TID) | ORAL | 12 refills | Status: AC

## 2021-01-13 NOTE — TOC Transition Note (Addendum)
Transition of Care [facility]) - CM/SW Discharge Note   Patient Details  Name: [name] MRN: [id number] Date of Birth: Dec 14, 1935  Transition of Care [facility]) CM/SW Contact:  Magnus Ivan, LCSW Phone Number: 01/13/2021, 11:08 AM   Clinical Narrative:   Peer to peer approved, patient has insurance authorization to go to SNF rehab. Patient to discharge to [facility] today, Room 228. Confirmed with Claiborne Billings at [facility]. CSW updated MD, RN, patient's daughter via voicemail. Asked RN to call report and MD to submit DC Summary. COVID test that was already completed is still good per Claiborne Billings. Medical Necessity Form and Face Sheet placed in Discharge Packet by patient chart. Will call EMS when patient is ready for transport.  2:00- LPN ready for transport. [facility] for transport to [facility], patient is next on the list. Updated LPN and Claiborne Billings at [facility].   Final next level of care: Skilled Nursing Facility Barriers to Discharge: Barriers Resolved   Patient Goals and CMS Choice Patient states their goals for this hospitalization and ongoing recovery are:: SNF rehab CMS Medicare.gov Compare Post Acute Care list provided to:: Patient Choice offered to / list presented to : [place]  Discharge Placement              Patient chooses bed at: [facility] Patient to be transferred to facility by: EMS Name of family member notified: [name] - daughter Patient and family notified of of transfer: 01/13/21  Discharge Plan and Services                                     Social Determinants of Health (SDOH) Interventions     Readmission Risk Interventions No flowsheet data found.

## 2021-01-13 NOTE — Evaluation (Signed)
Occupational Therapy Evaluation Patient Details Name: [name] MRN: [id number] DOB: 12-24-1934 Today's Date: 01/13/2021    History of Present Illness Pt is an 86 y/o M admitted on 01/08/21 with c/c of fall x 2 with BLE ankle pain & L hip pain. MRI showed possible L hip greater trochanter with suspected subtle acute nondisplaced fx. PMH: COPD, bipolar disorder, CKD3, combined systolic diastolic CHF, CAD s/p stents, HTN, HLD, OSA not complain with cpap, PVD, severe DJD of the hips & knees, alcoholism, asthma, dyspnea, prostate CA   Clinical Impression   Pt seen for OT treatment this date to f/u re: safety with ADLs/ADL mobility. Pt is pleasant and agreeable to session. OT engages pt in sup to sit transition with SUPV as pt is somewhat impulsive. He demos G static sitting balance. OT engages pt in seated UB bathing/dressing with SETUP/MIN A and MAX A for LB dressing in sitting to don sock to L LE. Pt requires MIN A/CGA to CTS and MOD/MAX A For posterior LB bathing d/t decreased dynamic standing balance/tolerance. Pt requires CGA/MIN A to complete SPS transfer with RW from EOB to chair adjacent and SETUP to eat breakfast. Pt requires cues for safety awareness throughout. While he is able to follow all cues, he demos decreased insight into deficits. Pt left with all needs met and in reach. Continue to anticipate pt will require SNF upon d/c from acute setting given the current level of assist he is requiring for ADLs/ADL mobility relative to PLOF of living [place].     Follow Up Recommendations  SNF    Equipment Recommendations  Other (comment) (defer)    Recommendations for Other Services       Precautions / Restrictions Precautions Precautions: Fall Other Brace: RLE walking boot Restrictions Weight Bearing Restrictions: Yes RLE Weight Bearing: Weight bearing as tolerated Other Position/Activity Restrictions: No active hip abduction (trochanter precautions) LLE      Mobility Bed  Mobility Overal bed mobility: Modified Independent Bed Mobility: Supine to Sit           General bed mobility comments: increased time, decreased safety awareness.    Transfers Overall transfer level: Needs assistance Equipment used: Rolling walker (2 wheeled) Transfers: Sit to/from [facility] Sit to Stand: Min guard;Min assist Stand pivot transfers: Min guard;Min assist       General transfer comment: pt impulsive, requires increased time.    Balance Overall balance assessment: Needs assistance Sitting-balance support: Bilateral upper extremity supported;Feet supported Sitting balance-Leahy Scale: Good Sitting balance - Comments: supervision   Standing balance support: Bilateral upper extremity supported Standing balance-Leahy Scale: Fair                             ADL either performed or assessed with clinical judgement   ADL Overall ADL's : Needs assistance/impaired Eating/Feeding: Set up;Sitting Eating/Feeding Details (indicate cue type and reason): supported in recliner Grooming: Wash/dry face;Applying deodorant;Set up;Sitting Grooming Details (indicate cue type and reason): seated EOB Upper Body Bathing: Minimal assistance;Sitting Upper Body Bathing Details (indicate cue type and reason): EOB Lower Body Bathing: Moderate assistance;Maximal assistance;Sit to/from stand Lower Body Bathing Details (indicate cue type and reason): able to complete anterior LB bathing with CGA with UE support on RW. Pt requires MOD/MAX A for posterior LB bathing in standing to both assist with balance and thorough completion of task. Upper Body Dressing : Minimal assistance;Sitting   Lower Body Dressing: Maximal assistance;Sitting/lateral leans Lower Body  Dressing Details (indicate cue type and reason): attempts to help with donning socks, but ultimately requires MAX A     Toileting- Clothing Manipulation and Hygiene: Minimal assistance;Sit to/from  stand Toileting - Clothing Manipulation Details (indicate cue type and reason): to perfrom             Vision Patient Visual Report: No change from baseline       Perception     Praxis      Pertinent Vitals/Pain Pain Assessment: No/denies pain     Hand Dominance     Extremity/Trunk Assessment Upper Extremity Assessment Upper Extremity Assessment: Generalized weakness   Lower Extremity Assessment Lower Extremity Assessment: Generalized weakness       Communication     Cognition Arousal/Alertness: Lethargic Behavior During Therapy: Impulsive;WFL for tasks assessed/performed Overall Cognitive Status: Difficult to assess                                 General Comments: pt with improved mood during OT session versus notes in chart from yesterday. However, demos some confusion. Pt follows simple one step commands appropriately.   General Comments       Exercises     Shoulder Instructions      Home Living                                          Prior Functioning/Environment                   OT Problem List:        OT Treatment/Interventions: Self-care/ADL training;Therapeutic exercise;Energy conservation;Therapeutic activities;Patient/family education;Balance training    OT Goals(Current goals can be found in the care plan section) Acute Rehab OT Goals Patient Stated Goal: none stated OT Goal Formulation: With patient Time For Goal Achievement: 01/23/21 Potential to Achieve Goals: Good  OT Frequency: Min 1X/week   Barriers to D/C: Inaccessible home environment;Decreased caregiver support          Co-evaluation              AM-PAC OT "6 Clicks" Daily Activity     Outcome Measure Help from another person eating meals?: None Help from another person taking care of personal grooming?: A Little Help from another person toileting, which includes using toliet, bedpan, or urinal?: A Lot Help from another  person bathing (including washing, rinsing, drying)?: A Lot Help from another person to put on and taking off regular upper body clothing?: A Little Help from another person to put on and taking off regular lower body clothing?: A Lot 6 Click Score: 16   End of Session Equipment Utilized During Treatment: Gait belt;Rolling walker Nurse Communication: Mobility status  Activity Tolerance: Patient tolerated treatment well Patient left: in bed;with call bell/phone within reach  OT Visit Diagnosis: Other abnormalities of gait and mobility (R26.89);History of falling (Z91.81)                Time: 6073-7106 OT Time Calculation (min): 38 min Charges:  OT General Charges $OT Visit: 1 Visit OT Treatments $Self Care/Home Management : 23-37 mins $Therapeutic Activity: 8-22 mins  Gerrianne Scale, MS, OTR/L ascom [phone] 01/13/21, 2:59 PM

## 2021-01-13 NOTE — Discharge Summary (Signed)
Physician Discharge Summary  [name] HYW:[id number] DOB: 26-Jan-1935 DOA: 01/08/2021  PCP: Marguerita Merles, MD  Admit date: 01/08/2021 Discharge date: 01/13/2021  Admitted From: Home Disposition: SNF  Recommendations for Outpatient Follow-up:  1. Follow up with PCP in 1-2 weeks 2. Follow-up with orthopedic surgery 3. Please obtain BMP/CBC in one week 4. Please follow up on the following pending results: None  Home Health: No Equipment/Devices: Rolling walker Discharge Condition: Stable CODE STATUS: Full Diet recommendation: Heart Healthy   Brief/Interim Summary: 86 year old male with past medical history of COPD, bipolar disorder, stage IV chronic kidney disease, chronic systolic CHF plus CAD and sleep apnea and PVD admitted on 1/22 after coming to the emergency room with 2 falls in the past few days with persistent bilateral ankle pain, left hip pain and inability to stand. In the emergency room, MRI noted questionable left hip/left greater trochanter with suspected subtle acute nondisplaced fracture. Orthopedic surgery consulted and it was felt that it was not confirmed that his left hip had indeed a operable fracture. Is possible some of this may be more from chronic pain and DJD plus bursitis. It was recommended weightbearing and limiting hip abduction. However, orthopedic surgery also evaluated patient's right foot and a CT scan noted a nondisplaced right anterior calcaneal fracture. Walking boot recommended and PT evaluation done which recommended skilled nursing.  Patient is being discharged to SNF for further rehab.  They were concerned about possible alcohol withdrawal, patient does not exhibit any sign of alcohol withdrawal, do become at times agitated which is very common in this age group, most likely some underlying dementia and sundowning.  Patient has an history of obstructive sleep apnea and needs CPAP at night.  Patient has an history of CKD stage IV and anemia of  chronic disease.  Creatinine and hemoglobin stable and at baseline.  He will continue his home medications which include Lasix, Imdur, Coreg and amlodipine.  Needs to follow-up with primary care provider to titrate the dose if needed.  Patient has an history of peripheral vascular disease and will continue his home dose of aspirin and Plavix.  He also has an history of chronic HFrEF, appears euvolemic and continue home dose of Coreg, Imdur and Lasix  Has an history of COPD.  Remained stable during current hospitalization.  And will continue as needed bronchodilators.  Patient will follow up with his providers for further management.  Discharge Diagnoses:  Principal Problem:   Right calcaneal fracture Active Problems:   Essential hypertension   COPD (chronic obstructive pulmonary disease) with chronic bronchitis (HCC)   Obstructive sleep apnea   Left hip pain   CKD (chronic kidney disease), stage IV (HCC)   Anemia in stage 3b chronic kidney disease (HCC)   Morbid obesity (HCC)   PVD (peripheral vascular disease) (HCC)   Chronic systolic CHF (congestive heart failure) ([place])    Discharge Instructions  Discharge Instructions    Diet - low sodium heart healthy   Complete by: As directed    Increase activity slowly   Complete by: As directed      Allergies as of 01/13/2021      Reactions   Chlorthalidone Other (See Comments)   Hyponatremia   Ace Inhibitors Cough   Hydrochlorothiazide    Lisinopril Other (See Comments)   Losartan    Other Other (See Comments)   "ANY BLOOD PRESSURE MEDICATIONS THAT I'VE TRIED" - PT. DOES NOT REMEMBER WHICH ONES "ANY BLOOD PRESSURE MEDICATIONS THAT I'VE TRIED"- PT.  DOES NOT REMEMBER WHICH ONES- CONSTIPATION   Benazepril-hydrochlorothiazide Other (See Comments)   Constipation      Medication List    TAKE these medications   Adult Aspirin Regimen 81 MG EC tablet Generic drug: aspirin TAKE 1 TABLET BY MOUTH DAILY   albuterol 108 (90  Base) MCG/ACT inhaler Commonly known as: VENTOLIN HFA USE 2 PUFFS EVERY 4 HOURS AS NEEDED   amLODipine 5 MG tablet Commonly known as: NORVASC Take 1 tablet (5 mg total) by mouth daily.   atorvastatin 80 MG tablet Commonly known as: LIPITOR Take 0.5 tablets (40 mg total) by mouth daily.   azelastine 0.1 % nasal spray Commonly known as: ASTELIN azelastine 137 mcg (0.1 %) nasal spray aerosol   calcium carbonate 1500 (600 Ca) MG Tabs tablet Commonly known as: OSCAL Take 1 tablet (1,500 mg total) by mouth 2 (two) times daily with a meal.   carvedilol 6.25 MG tablet Commonly known as: COREG TAKE 1 TABLET BY MOUTH TWICE A DAY   clopidogrel 75 MG tablet Commonly known as: PLAVIX Take 1 tablet (75 mg total) by mouth daily.   feeding supplement Liqd Take 237 mLs by mouth 3 (three) times daily between meals.   fluticasone 50 MCG/ACT nasal spray Commonly known as: FLONASE Place 2 sprays into both nostrils daily.   furosemide 40 MG tablet Commonly known as: LASIX TAKE 1 TABLET BY MOUTH DAILY AND TAKE ANADDITIONAL TABLET IN THE AFTERNOON AS NEEDED FOR WEIGHT GAIN AND LEG EDEMA   gabapentin 300 MG capsule Commonly known as: Neurontin Take 1 capsule (300 mg total) by mouth 2 (two) times daily for 7 days, THEN 1 capsule (300 mg total) 3 (three) times daily for 7 days, THEN 1 capsule (300 mg total) 4 (four) times daily for 15 days. Follow written titration schedule.. Start taking on: November 29, 2020   HYDROcodone-acetaminophen 5-325 MG tablet Commonly known as: NORCO/VICODIN Take 1-2 tablets by mouth every 6 (six) hours as needed for moderate pain.   ipratropium 0.03 % nasal spray Commonly known as: ATROVENT Place 2 sprays into both nostrils daily.   isosorbide mononitrate 30 MG 24 hr tablet Commonly known as: IMDUR TAKE 1 TABLET BY MOUTH DAILY   Magnesium Oxide 500 MG Caps Take 1 capsule (500 mg total) by mouth daily.   multivitamin with minerals Tabs tablet Take 1 tablet  by mouth daily.   naproxen 500 MG tablet Commonly known as: NAPROSYN Take 500 mg by mouth daily.   nitroGLYCERIN 0.4 MG SL tablet Commonly known as: NITROSTAT Take 0.4 mg by mouth every 5 (five) minutes x 3 doses as needed for chest pain. As needed for chest pain   QUEtiapine 50 MG tablet Commonly known as: SEROQUEL Take 50 mg by mouth at bedtime.   Trelegy Ellipta 200-62.5-25 MCG/INH Aepb Generic drug: Fluticasone-Umeclidin-Vilant Inhale 1 puff into the lungs daily.   Vitamin D3 125 MCG (5000 UT) Caps Take 1 capsule (5,000 Units total) by mouth daily with breakfast. Take along with calcium and magnesium.       Contact information for follow-up providers    Marguerita Merles, MD. Schedule an appointment as soon as possible for a visit.   Specialty: Family Medicine Contact information: [place] [address] [phone]        [name], MD .   Specialty: Cardiology Contact information: [place] [address] [phone]            Contact  information for after-discharge care    Destination    [facility] Preferred SNF .   Service: Skilled Nursing Contact information: [address] [address] [phone]                 Allergies  Allergen Reactions  . Chlorthalidone Other (See Comments)    Hyponatremia  . Ace Inhibitors Cough  . Hydrochlorothiazide   . Lisinopril Other (See Comments)  . Losartan   . Other Other (See Comments)    "ANY BLOOD PRESSURE MEDICATIONS THAT I'VE TRIED" - PT. DOES NOT REMEMBER WHICH ONES "ANY BLOOD PRESSURE MEDICATIONS THAT I'VE TRIED"- PT. DOES NOT REMEMBER WHICH ONES- CONSTIPATION  . Benazepril-Hydrochlorothiazide Other (See Comments)    Constipation    Consultations:  Orthopedic surgery  Procedures/Studies: DG Ankle Complete Left  Result Date: 01/08/2021 CLINICAL DATA:  Status post fall with left ankle pain. EXAM:  LEFT ANKLE COMPLETE - 3+ VIEW COMPARISON:  None. FINDINGS: There is no evidence of fracture, dislocation, or joint effusion. Chronic fusion of the distal tibia and fibula is identified. IMPRESSION: No acute fracture or dislocation. Electronically Signed   By: Abelardo Diesel M.D.   On: 01/08/2021 09:31   DG Ankle Complete Right  Result Date: 01/08/2021 CLINICAL DATA:  Status post fall with ankle pain. EXAM: RIGHT ANKLE - COMPLETE 3+ VIEW COMPARISON:  None. FINDINGS: There is no evidence of fracture, dislocation, or joint effusion. Plantar calcaneal spur is identified. IMPRESSION: No acute fracture or dislocation. Electronically Signed   By: Abelardo Diesel M.D.   On: 01/08/2021 09:32   CT Hip Left Wo Contrast  Result Date: 01/08/2021 CLINICAL DATA:  Chronic left hip pain EXAM: CT OF THE LEFT HIP WITHOUT CONTRAST TECHNIQUE: Multidetector CT imaging of the left hip was performed according to the standard protocol. Multiplanar CT image reconstructions were also generated. COMPARISON:  Right hip/pelvic radiographs dated 01/08/2021 at 0835 hours FINDINGS: Deformity related to a prior/healed left hip fracture status post ORIF. Flattening of the femoral head with proximal migration and mild to moderate degenerative changes. Associated loose bodies posteriorly in the hip joint (coronal image 83). No acute fracture is seen. No evidence of hardware loosening or complication. No intramuscular hematoma or subcutaneous fluid collection. Right hip arthroplasty, incompletely visualized. Incidental findings include a penile prosthesis, sigmoid diverticulosis, and vascular calcifications. IMPRESSION: Deformity related to a prior/healed left hip fracture status post ORIF. No evidence of hardware loosening or complication. Associated mild to moderate degenerative changes with two loose bodies posteriorly in the hip joint, as described above. Electronically Signed   By: Julian Hy M.D.   On: 01/08/2021 11:03   CT FOOT  RIGHT WO CONTRAST  Result Date: 01/09/2021 CLINICAL DATA:  Right foot pain after fall 1 day ago EXAM: CT OF THE RIGHT FOOT WITHOUT CONTRAST TECHNIQUE: Multidetector CT imaging of the right foot was performed according to the standard protocol. Multiplanar CT image reconstructions were also generated. COMPARISON:  Right ankle x-ray 01/08/2021 FINDINGS: Bones/Joint/Cartilage No acute fracture or dislocation of the right ankle. There is a chronic healed fracture of the distal fibular metaphysis. Degenerative spurring at the distal tibiofibular joint. Chronic well corticated bony fragments along the inferior aspect of the medial malleolus. Degenerative spurring within the medial gutter. Ankle mortise is congruent. There are a few tiny intra-articular loose bodies adjacent to the lateral talar shoulder. No tibiotalar joint effusion. Acute nondisplaced fracture of the tip of the anterior process of the calcaneus (series 8, image 59). Remainder  of the calcaneus is intact. Bidirectional calcaneal enthesophytes. Subtalar joints are maintained. Bones of the midfoot are intact without fracture or malalignment. Mild joint space narrowing throughout the midfoot most pronounced at the naviculocuneiform articulation. Tarsometatarsal joints are intact with mild joint space narrowing. Severe osteoarthritis of the first MTP joint with prominent subchondral cystic changes and marginal osteophyte formation. Degenerative changes of the hallux sesamoid complex. No fracture or malalignment of the forefoot. Ligaments Suboptimally assessed by CT. Muscles and Tendons No evidence of acute musculotendinous abnormality. Soft tissues Mild soft tissue swelling at the lateral aspect of the midfoot/hindfoot. No organized fluid collection or hematoma. IMPRESSION: 1. Acute nondisplaced fracture of the tip of the anterior process of the calcaneus. 2. Severe osteoarthritis of the first MTP joint. 3. Additional chronic degenerative and posttraumatic  changes, as above. Electronically Signed   By: Davina Poke D.O.   On: 01/09/2021 14:32   MR HIP LEFT WO CONTRAST  Result Date: 01/08/2021 CLINICAL DATA:  Left hip pain after fall EXAM: MR OF THE LEFT HIP WITHOUT CONTRAST TECHNIQUE: Multiplanar, multisequence MR imaging was performed. No intravenous contrast was administered. COMPARISON:  X-ray and CT 01/08/2021 FINDINGS: Bones/Joint/Cartilage Status post ORIF of the left femur side plate and compression screw fixation construct. Susceptibility artifact from hardware degrades evaluation of the adjacent structures. There is significant remodeling of the femoral head. Fluid signal is present within the left hip joint space with remodeling/expansion of the acetabulum (series 2, image 16). There is bone marrow edema within the greater trochanter with subtle linear low T1 signal intensity component suggesting a nondisplaced fracture (series 4 and 5, image 16). Patchy marrow edema within the supra-acetabular aspect of the left ilium, which is likely reactive. Susceptibility artifact related to right total hip arthroplasty hardware without apparent complication. No periprosthetic fluid collection. Degenerative changes of the pubic symphysis. Ligaments Grossly intact. Muscles and Tendons Intramuscular edema within the left gluteus medius and minimus muscles. No acute tendinous injury is seen. Soft tissues Negative for soft tissue hematoma. No inguinal lymphadenopathy. Diverticular change within the visualized sigmoid colon. Bilateral hydroceles. IMPRESSION: 1. Bone marrow edema within the left greater trochanter with suspected subtle acute nondisplaced fracture. 2. Status post ORIF of the left femur side plate and compression screw fixation construct. Fluid signal within the left hip joint space with remodeling/expansion of the acetabulum. Findings may reflect granulomatosis/particle disease. 3. Patchy marrow edema within the supra-acetabular aspect of the left  ilium, which is likely reactive. 4. Intramuscular edema within the left gluteus medius and minimus muscles, which may reflect muscle strain. Electronically Signed   By: Davina Poke D.O.   On: 01/08/2021 19:06   Chest Portable 1 View  Result Date: 01/09/2021 CLINICAL DATA:  Two falls. History of heart disease, COPD, hypertension. EXAM: PORTABLE CHEST 1 VIEW COMPARISON:  04/13/2019 FINDINGS: Heart size and pulmonary vascularity are normal for technique. No definite airspace disease or consolidation in the lungs. No pleural effusions. No pneumothorax. Calcification of the aorta. IMPRESSION: No active disease. Electronically Signed   By: Lucienne Capers M.D.   On: 01/09/2021 00:22   DG PAIN CLINIC C-ARM 1-60 MIN NO REPORT  Result Date: 12/23/2020 Fluoro was used, but no Radiologist interpretation will be provided. Please refer to "NOTES" tab for provider progress note.  DG Hip Unilat With Pelvis 2-3 Views Left  Result Date: 01/08/2021 CLINICAL DATA:  Status post fall with hip pain. EXAM: DG HIP (WITH OR WITHOUT PELVIS) 2-3V LEFT COMPARISON:  May 12, 2020 FINDINGS: There is  no evidence of hip fracture or dislocation. Total right hip replacement is identified. Patient status post prior compression screw placement of left femur unchanged compared prior exam. IMPRESSION: No acute fracture or dislocation. Electronically Signed   By: Abelardo Diesel M.D.   On: 01/08/2021 09:30   DG Hip Unilat  With Pelvis 2-3 Views Right  Result Date: 01/08/2021 CLINICAL DATA:  Status post fall this morning with hip pain. EXAM: DG HIP (WITH OR WITHOUT PELVIS) 2-3V RIGHT COMPARISON:  May 12, 2020 FINDINGS: There is no evidence of hip fracture or dislocation. Total right hip replacement is identified. Chronic changes of the left hip are noted stable. IMPRESSION: No acute fracture or dislocation. Electronically Signed   By: Abelardo Diesel M.D.   On: 01/08/2021 09:29    Subjective: Patient was seen and examined today.   Sitting in chair.  Ate his breakfast and no new complaints.  He is ready to go to rehab.  Discharge Exam: Vitals:   01/13/21 0735 01/13/21 1121  BP: (!) 148/73 (!) 144/126  Pulse: (!) 58 60  Resp: 16 17  Temp: 97.8 F (36.6 C) 98 F (36.7 C)  SpO2: 94% 97%   Vitals:   01/12/21 1507 01/13/21 0536 01/13/21 0735 01/13/21 1121  BP: (!) 119/54 (!) 153/94 (!) 148/73 (!) 144/126  Pulse: (!) 53 71 (!) 58 60  Resp: 18 16 16 17   Temp:  98.3 F (36.8 C) 97.8 F (36.6 C) 98 F (36.7 C)  TempSrc:    Oral  SpO2: 98% 98% 94% 97%  Weight:      Height:        General: Pt is alert, awake, not in acute distress Cardiovascular: RRR, S1/S2 +, no rubs, no gallops Respiratory: CTA bilaterally, no wheezing, no rhonchi Abdominal: Soft, NT, ND, bowel sounds + Extremities: no edema, no cyanosis, right foot with boot   The results of significant diagnostics from this hospitalization (including imaging, microbiology, ancillary and laboratory) are listed below for reference.    Microbiology: Recent Results (from the past 240 hour(s))  SARS CORONAVIRUS 2 (TAT 6-24 HRS) Nasopharyngeal Nasopharyngeal Swab     Status: None   Collection Time: 01/08/21  3:00 PM   Specimen: Nasopharyngeal Swab  Result Value Ref Range Status   SARS Coronavirus 2 NEGATIVE NEGATIVE Final    Comment: (NOTE) SARS-CoV-2 target nucleic acids are NOT DETECTED.  The SARS-CoV-2 RNA is generally detectable in upper and lower respiratory specimens during the acute phase of infection. Negative results do not preclude SARS-CoV-2 infection, do not rule out co-infections with other pathogens, and should not be used as the sole basis for treatment or other patient management decisions. Negative results must be combined with clinical observations, patient history, and epidemiological information. The expected result is Negative.  Fact Sheet for Patients: SugarRoll.be  Fact Sheet for Healthcare  Providers: https://www.woods-mathews.com/  This test is not yet approved or cleared by the [place] FDA and  has been authorized for detection and/or diagnosis of SARS-CoV-2 by FDA under an Emergency Use Authorization (EUA). This EUA will remain  in effect (meaning this test can be used) for the duration of the COVID-19 declaration under Se ction 564(b)(1) of the Act, 21 U.S.C. section 360bbb-3(b)(1), unless the authorization is terminated or revoked sooner.  Performed at [facility], [place] [address] [place], [place] [address]   SARS CORONAVIRUS 2 (TAT 6-24 HRS) Nasopharyngeal Nasopharyngeal Swab     Status: None   Collection Time: 01/11/21  4:15  PM   Specimen: Nasopharyngeal Swab  Result Value Ref Range Status   SARS Coronavirus 2 NEGATIVE NEGATIVE Final    Comment: (NOTE) SARS-CoV-2 target nucleic acids are NOT DETECTED.  The SARS-CoV-2 RNA is generally detectable in upper and lower respiratory specimens during the acute phase of infection. Negative results do not preclude SARS-CoV-2 infection, do not rule out co-infections with other pathogens, and should not be used as the sole basis for treatment or other patient management decisions. Negative results must be combined with clinical observations, patient history, and epidemiological information. The expected result is Negative.  Fact Sheet for Patients: SugarRoll.be  Fact Sheet for Healthcare Providers: https://www.woods-mathews.com/  This test is not yet approved or cleared by the [place] FDA and  has been authorized for detection and/or diagnosis of SARS-CoV-2 by FDA under an Emergency Use Authorization (EUA). This EUA will remain  in effect (meaning this test can be used) for the duration of the COVID-19 declaration under Se ction 564(b)(1) of the Act, 21 U.S.C. section 360bbb-3(b)(1), unless the authorization is terminated or revoked  sooner.  Performed at [facility], [place] [address] [place], [place] [address]      Labs: BNP (last 3 results) No results for input(s): BNP in the last 8760 hours. Basic Metabolic Panel: Recent Labs  Lab 01/08/21 1148 01/09/21 0730 01/10/21 0245 01/13/21 0718  NA 140 140 140 140  K 3.7 4.1 3.8 4.3  CL 101 106 106 107  CO2 25 25 24  21*  GLUCOSE 106* 106* 98 93  BUN 33* 29* 33* 34*  CREATININE 2.41* 2.16*  2.08* 2.31* 2.16*  CALCIUM 8.6* 8.5* 8.2* 8.5*  MG  --   --  2.1  --   PHOS  --   --  4.6  --    Liver Function Tests: Recent Labs  Lab 01/10/21 0245  AST 16  ALT 10  ALKPHOS 91  BILITOT 1.1  PROT 6.7  ALBUMIN 2.8*   No results for input(s): LIPASE, AMYLASE in the last 168 hours. No results for input(s): AMMONIA in the last 168 hours. CBC: Recent Labs  Lab 01/08/21 1148 01/09/21 0730 01/10/21 0245 01/13/21 0718  WBC 8.6 8.3 6.8 6.0  NEUTROABS 5.9  --  4.5  --   HGB 10.7* 10.2* 9.8* 10.4*  HCT 32.8* 31.0* 29.0* 31.4*  MCV 86.3 85.9 84.5 84.6  PLT 209 215 203 210   Cardiac Enzymes: No results for input(s): CKTOTAL, CKMB, CKMBINDEX, TROPONINI in the last 168 hours. BNP: Invalid input(s): POCBNP CBG: No results for input(s): GLUCAP in the last 168 hours. D-Dimer No results for input(s): DDIMER in the last 72 hours. Hgb A1c No results for input(s): HGBA1C in the last 72 hours. Lipid Profile No results for input(s): CHOL, HDL, LDLCALC, TRIG, CHOLHDL, LDLDIRECT in the last 72 hours. Thyroid function studies No results for input(s): TSH, T4TOTAL, T3FREE, THYROIDAB in the last 72 hours.  Invalid input(s): FREET3 Anemia work up No results for input(s): VITAMINB12, FOLATE, FERRITIN, TIBC, IRON, RETICCTPCT in the last 72 hours. Urinalysis    Component Value Date/Time   COLORURINE YELLOW (A) 07/31/2017 1025   APPEARANCEUR Clear 06/14/2018 1341   LABSPEC 1.013 07/31/2017 1025   LABSPEC 1.004 04/03/2015 1216   PHURINE 5.0 07/31/2017 1025    GLUCOSEU Negative 06/14/2018 1341   GLUCOSEU Negative 04/03/2015 1216   HGBUR MODERATE (A) 07/31/2017 1025   BILIRUBINUR Negative 06/14/2018 1341   BILIRUBINUR Negative 04/03/2015 1216   KETONESUR 5 (A) 07/31/2017  1025   PROTEINUR 3+ (A) 06/14/2018 1341   PROTEINUR 100 (A) 07/31/2017 1025   UROBILINOGEN 0.2 03/27/2011 0845   NITRITE Negative 06/14/2018 1341   NITRITE NEGATIVE 07/31/2017 1025   LEUKOCYTESUR Trace (A) 06/14/2018 1341   LEUKOCYTESUR Negative 04/03/2015 1216   Sepsis Labs Invalid input(s): PROCALCITONIN,  WBC,  LACTICIDVEN Microbiology Recent Results (from the past 240 hour(s))  SARS CORONAVIRUS 2 (TAT 6-24 HRS) Nasopharyngeal Nasopharyngeal Swab     Status: None   Collection Time: 01/08/21  3:00 PM   Specimen: Nasopharyngeal Swab  Result Value Ref Range Status   SARS Coronavirus 2 NEGATIVE NEGATIVE Final    Comment: (NOTE) SARS-CoV-2 target nucleic acids are NOT DETECTED.  The SARS-CoV-2 RNA is generally detectable in upper and lower respiratory specimens during the acute phase of infection. Negative results do not preclude SARS-CoV-2 infection, do not rule out co-infections with other pathogens, and should not be used as the sole basis for treatment or other patient management decisions. Negative results must be combined with clinical observations, patient history, and epidemiological information. The expected result is Negative.  Fact Sheet for Patients: SugarRoll.be  Fact Sheet for Healthcare Providers: https://www.woods-mathews.com/  This test is not yet approved or cleared by the [place] FDA and  has been authorized for detection and/or diagnosis of SARS-CoV-2 by FDA under an Emergency Use Authorization (EUA). This EUA will remain  in effect (meaning this test can be used) for the duration of the COVID-19 declaration under Se ction 564(b)(1) of the Act, 21 U.S.C. section 360bbb-3(b)(1), unless the  authorization is terminated or revoked sooner.  Performed at [facility], [place] [address], [place], [place] [address]   SARS CORONAVIRUS 2 (TAT 6-24 HRS) Nasopharyngeal Nasopharyngeal Swab     Status: None   Collection Time: 01/11/21  4:15 PM   Specimen: Nasopharyngeal Swab  Result Value Ref Range Status   SARS Coronavirus 2 NEGATIVE NEGATIVE Final    Comment: (NOTE) SARS-CoV-2 target nucleic acids are NOT DETECTED.  The SARS-CoV-2 RNA is generally detectable in upper and lower respiratory specimens during the acute phase of infection. Negative results do not preclude SARS-CoV-2 infection, do not rule out co-infections with other pathogens, and should not be used as the sole basis for treatment or other patient management decisions. Negative results must be combined with clinical observations, patient history, and epidemiological information. The expected result is Negative.  Fact Sheet for Patients: SugarRoll.be  Fact Sheet for Healthcare Providers: https://www.woods-mathews.com/  This test is not yet approved or cleared by the [place] FDA and  has been authorized for detection and/or diagnosis of SARS-CoV-2 by FDA under an Emergency Use Authorization (EUA). This EUA will remain  in effect (meaning this test can be used) for the duration of the COVID-19 declaration under Se ction 564(b)(1) of the Act, 21 U.S.C. section 360bbb-3(b)(1), unless the authorization is terminated or revoked sooner.  Performed at [facility], [place] [address] [place], [place] [address]     Time coordinating discharge: Over 30 minutes  SIGNED:  Lorella Nimrod, MD  [facility] 01/13/2021, 11:53 AM  If 7PM-7AM, please contact night-coverage [email or web address]  This record has been created using Systems analyst. Errors have been sought and corrected,but may not always be located. Such creation errors do not reflect on  the standard of care.

## 2021-01-18 NOTE — Progress Notes (Deleted)
Cardiology Office Note    Date:  01/18/2021   ID:  Nicholas UZZLE, DOB 1935-08-16, MRN [id number]  PCP:  Marguerita Merles, MD  Cardiologist:  Kathlyn Sacramento, MD  Electrophysiologist:  None   Chief Complaint: Hospital follow up  History of Present Illness:   [name] is a 86 y.o. male with history of CAD s/p Taxus DES to the distal RCA in 2005 with an inferior STEMI in 03/2019 s/p overlapping DES to the proximal and mid RCA, chronic combined systolic and diastolic CHF, aortic stenosis, borderline DM, CKD stage IV, anemia of chronic disease, PAD, bipolar disorder, HTN, HLD, morbid obesity, and prior tobacco use who presents for hospital follow up for mechanical fall leading to right anterior calcaneal fracture and possible left hip fracture, both conservatively managed.  Echo in 07/2017 showed an EF of 40-45%, mild to moderate aortic stenosis, and mild biatrial enlargement. Lexiscan MPI at that time showed no significant ischemia with a small region of apical thinning likely attenuation artifact with an EF of 33% (felt to be falsely reduced secondary to GI uptake artifact). He was admitted in 03/2019 with an inferior STEMI with LHC demonstrating 90% proximal and mid RCA stenosis which was successfully treated with overlapping DES. The previously placed distal RCA stent was patent with mild ISR. Echo showed an EF of 45-50% with severe inferior wall hypokinesis. He was last seen in the office on 12/30/2020 and was doing well from a cardiac perspective. He was mostly bothered by chronic left hip pain, though was told he was not a surgical candidate due to his age and comorbid conditions.   He was admitted to [facility] from 1/22 to 1/27 for 2 separate mechanical falls with a possible left hip fracture and a nondisplaced right anterior calcaneal fracture with conservative management advised for both by orthopedics.   ***   Labs independently reviewed: 12/2020 - HGB 10.4, PLT 210, potassium 4.3, BUN 34, SCr  2.16, magnesium 2.1, albumin 2.8, AST/ALT normal 03/2019 - A1c 6.2, TC 104, TG 129, HDL 43, LDL 55  Past Medical History:  Diagnosis Date  . Alcoholism ([place])   . Asthma   . Bipolar affective disorder ([place])   . Chronic combined systolic and diastolic CHF (congestive heart failure) ([place])    a. 08/2016 Echo: EF 40-45%, mild AS, mild to mod MR, mildly dil LA/RA, mild-mod TR; b. 07/2017 Echo: EF 40-45%, mod LVH, Gr1 DD, mild to mod AS, mildly dil LA, nl RV fxn; c. 03/2019 Echo: EF 45-50%, AS (not severe). Mod dil PA.  . CKD (chronic kidney disease), stage III ([place])   . COPD (chronic obstructive pulmonary disease) ([place])   . Coronary artery disease    a. 01/2004 s/p PCI and Taxus DES to dRCA (3.5 x 12 mm); b. 07/2017 Lexiscan MV: no ischemia. Sm area of apicl thinning, likely attenuation. EF 33% (GI uptake noted)-->Low risk; c. 03/2019 Inf STEMI/PCI: LM nl, LAD min irregs, D1 20ost, RI 20ost, LCX nl, RCA 90p/59m (4.0x26 Resolute Onyx DES), 67m (4.0x15 Resolute Onyx DES), 10d ISR.  [place] Kitchen Degenerative joint disease    knees and hip  . Dyspnea    on exertion  . Essential hypertension   . Hyperlipidemia   . Hypertension    controlled on meds  . Ischemic cardiomyopathy    a. 08/2016 Echo: EF 40-45%; b. 07/2017 Echo: EF 40-45%; c. 03/2019 Echo: EF 45-50%.  . OSA (obstructive sleep apnea)   . Prostate CA ([place])  prostate ca dx 20 yrs ago  . PVD (peripheral vascular disease) ([place])   . Tobacco abuse     Past Surgical History:  Procedure Laterality Date  . CARDIAC CATHETERIZATION    . CORONARY ANGIOGRAPHY N/A 04/01/2019   Procedure: CORONARY ANGIOGRAPHY;  Surgeon: Nelva Bush, MD;  Location: [facility];  Service: Cardiovascular;  Laterality: N/A;  . CORONARY ANGIOPLASTY WITH STENT PLACEMENT     x2  . CORONARY/GRAFT ACUTE MI REVASCULARIZATION N/A 04/01/2019   Procedure: Coronary/Graft Acute MI Revascularization;  Surgeon: Nelva Bush, MD;  Location: [facility];  Service:  Cardiovascular;  Laterality: N/A;  . TOTAL HIP ARTHROPLASTY     right    Current Medications: No outpatient medications have been marked as taking for the 01/21/21 encounter (Appointment) with Rise Mu, PA-C.    Allergies:   Chlorthalidone, Ace inhibitors, Hydrochlorothiazide, Lisinopril, Losartan, Other, and Benazepril-hydrochlorothiazide   Social History   Socioeconomic History  . Marital status: Legally Separated    Spouse name: Not on file  . Number of children: Not on file  . Years of education: Not on file  . Highest education level: Not on file  Occupational History  . Occupation: retired  Tobacco Use  . Smoking status: Former Smoker    Packs/day: 1.00    Years: 50.00    Pack years: 50.00    Types: Cigarettes  . Smokeless tobacco: Never Used  Vaping Use  . Vaping Use: Never used  Substance and Sexual Activity  . Alcohol use: Not Currently  . Drug use: No  . Sexual activity: Never  Other Topics Concern  . Not on file  Social History Narrative  . Not on file   Social Determinants of Health   Financial Resource Strain: Not on file  Food Insecurity: Not on file  Transportation Needs: Not on file  Physical Activity: Not on file  Stress: Not on file  Social Connections: Not on file     Family History:  The patient's family history includes Heart attack in his father and mother; Hyperlipidemia in his father and mother; Hypertension in his father and mother. There is no history of Prostate cancer, Bladder Cancer, or Kidney cancer.  ROS:   ROS   EKGs/Labs/Other Studies Reviewed:    Studies reviewed were summarized above. The additional studies were reviewed today:  2D echo 03/2019: 1. Severe hypokinesis of the left ventricular, entire inferior wall and  inferoseptal wall.  2. The left ventricle has mildly reduced systolic function, with an  ejection fraction of 45-50%. The cavity size was normal. There is severely  increased left ventricular wall  thickness. Left ventricular diastolic  function could not be evaluated due to  nondiagnostic images.  3. Left atrial size was not well visualized.  4. The mitral valve was not well visualized. There is mild mitral annular  calcification present.  5. The aortic valve is tricuspid. Mild thickening of the aortic valve.  Mild calcification of the aortic valve. Aortic valve regurgitation was not  assessed by color flow Doppler.  6. Degree of aortic stenosis could not be assessed due to lack of apical  windows for pulse wave Doppler of the LVOT/AoV. However, it does not  appear to be severe.  7. Moderately dilated pulmonary artery.  8. The interatrial septum was not well visualized.  __________  LHC 03/2019: Conclusions: 1. Severe single-vessel coronary artery disease with sequential 80-95% proximal and mid RCA stenoses with heavy thrombus causing inferior STEMI. 2. Mild,  non-obstructive coronary artery disease involving the left coronary artery. 3. Patent distal RCA Taxus stent with mild in-stent restenosis. 4. Challenging but ultimately successful PCI to the proximal and mid RCA lesions with non-overlapping Resolute Onyx 4.0 x 26 mm (proximal - dilated to 4.4 mm) and 4.0 x 15 mm (distal - dilated to 4.2 mm) drug-eluting stents with 0% residual stenosis and TIMI-3 flow.  Recommendations: 1. Admit to ICU, hospitalist service, for post-STEMI monitoring. 2. Dual antiplatelet therapy with aspirin and ticagrelor for at least 12 months, ideally longer given multiple stents in the RCA, including an old Taxus stent. 3. Gentle post-catheterization hydration, given chronic kidney disease and contrast use during today's case. 4. Obtain transthoracic echocardiogram for evaluation of LVEF and aortic stenosis. 5. Aggressive secondary prevention. __________  [place] [facility] MPI 07/2017: Pharmacological myocardial perfusion imaging study with no significant  Ischemia Small region of apical thinning, likely  attenuation artifact Mild global hypokinesis, EF estimated at 33%, (estimated EF difficult to measure given GI uptake artifact) No EKG changes concerning for ischemia at peak stress or in recovery. Resting EKG with T-wave abnormality anterolateral leads, PVCs Low risk scan __________  2D echo 07/2017: - Procedure narrative: Transthoracic echocardiography. The study  was technically difficult.  - Left ventricle: The cavity size was at the upper limits of  normal. Wall thickness was increased in a pattern of moderate  LVH. Systolic function was mildly to moderately reduced. The  estimated ejection fraction was in the range of 40% to 45%.  Doppler parameters are consistent with abnormal left ventricular  relaxation (grade 1 diastolic dysfunction). Doppler parameters  are consistent with high ventricular filling pressure.  - Aortic valve: Trileaflet; mildly thickened, mildly calcified  leaflets. Valve mobility was mildly restricted. Transvalvular  velocity was minimally increased. There was mild to moderate  stenosis. Valve area (VTI): 1.67 cm^2. Valve area (Vmax): 1.25  cm^2. Valve area (Vmean): 1.29 cm^2.  - Left atrium: The atrium was mildly dilated.  - Right ventricle: The cavity size was normal. Systolic function  was normal.  - Right atrium: The atrium was mildly dilated. __________  2D echo 08/2016: - Left ventricle: Systolic function was mildly to moderately  reduced. The estimated ejection fraction was in the range of 40%  to 45%.  - Aortic valve: There was mild stenosis. Valve area (VTI): 1.26  cm^2. Valve area (Vmax): 1.32 cm^2. Valve area (Vmean): 1.33  cm^2.  - Mitral valve: There was mild to moderate regurgitation.  - Left atrium: The atrium was mildly dilated.  - Right atrium: The atrium was mildly dilated.  - Tricuspid valve: There was mild-moderate regurgitation. __________  2D echo 02/2016: - Left ventricle: The cavity size was  normal. There was moderate  concentric hypertrophy. Systolic function was normal. The  estimated ejection fraction was in the range of 50% to 55%.  Doppler parameters are consistent with abnormal left ventricular  relaxation (grade 1 diastolic dysfunction).  - Aortic valve: There was mild stenosis. Mean gradient (S): 7 mm  Hg. Valve area (VTI): 2.29 cm^2.  - Mitral valve: Calcified annulus.  - Left atrium: The atrium was moderately dilated.  - Pericardium, extracardiac: A trivial pericardial effusion was  identified posterior to the heart.   EKG:  EKG is ordered today.  The EKG ordered today demonstrates ***  Recent Labs: 01/10/2021: ALT 10; Magnesium 2.1 01/13/2021: BUN 34; Creatinine, Ser 2.16; Hemoglobin 10.4; Platelets 210; Potassium 4.3; Sodium 140  Recent Lipid Panel    Component Value Date/Time  CHOL 104 04/01/2019 0431   CHOL 189 04/26/2016 0810   CHOL 138 05/31/2014 0416   TRIG 30 04/01/2019 0431   TRIG 75 05/31/2014 0416   HDL 43 04/01/2019 0431   HDL 66 04/26/2016 0810   HDL 40 05/31/2014 0416   CHOLHDL 2.4 04/01/2019 0431   VLDL 6 04/01/2019 0431   VLDL 15 05/31/2014 0416   LDLCALC 55 04/01/2019 0431   LDLCALC 97 04/26/2016 0810   LDLCALC 83 05/31/2014 0416    PHYSICAL EXAM:    VS:  There were no vitals taken for this visit.  BMI: There is no height or weight on file to calculate BMI.  Physical Exam  Wt Readings from Last 3 Encounters:  01/08/21 277 lb (125.6 kg)  01/06/21 278 lb (126.1 kg)  12/30/20 277 lb (125.6 kg)     ASSESSMENT & PLAN:   1. CAD involving the native coronary arteries without***angina:  2. HFrEF secondary to ICM:  3. HTN: Blood pressure ***  4. HLD: LDL 55 with a triglyceride of 129 from 03/2019.  Disposition: F/u with Dr. Fletcher Anon or an APP in ***.   Medication Adjustments/Labs and Tests Ordered: Current medicines are reviewed at length with the patient today.  Concerns regarding medicines are outlined above.  Medication changes, Labs and Tests ordered today are summarized above and listed in the Patient Instructions accessible in Encounters.   Signed, Christell Faith, PA-C 01/18/2021 7:37 AM     [place] [address] [place], [place] [address] [phone]

## 2021-01-20 ENCOUNTER — Ambulatory Visit: Payer: HMO | Admitting: Licensed Clinical Social Worker

## 2021-01-20 ENCOUNTER — Ambulatory Visit: Payer: HMO | Admitting: Pain Medicine

## 2021-01-20 ENCOUNTER — Other Ambulatory Visit: Payer: Self-pay

## 2021-01-21 ENCOUNTER — Ambulatory Visit: Payer: HMO | Admitting: Physician Assistant

## 2021-01-24 ENCOUNTER — Encounter: Payer: Self-pay | Admitting: Physician Assistant

## 2021-01-27 ENCOUNTER — Emergency Department (HOSPITAL_COMMUNITY): Payer: HMO

## 2021-01-27 ENCOUNTER — Emergency Department (HOSPITAL_COMMUNITY)
Admission: EM | Admit: 2021-01-27 | Discharge: 2021-01-30 | Disposition: A | Discharge status: Home / Self Care | Payer: HMO | Attending: Emergency Medicine | Admitting: Emergency Medicine

## 2021-01-27 DIAGNOSIS — R4689 Other symptoms and signs involving appearance and behavior: Secondary | ICD-10-CM

## 2021-01-27 DIAGNOSIS — Z7982 Long term (current) use of aspirin: Secondary | ICD-10-CM | POA: Insufficient documentation

## 2021-01-27 DIAGNOSIS — F3131 Bipolar disorder, current episode depressed, mild: Secondary | ICD-10-CM | POA: Insufficient documentation

## 2021-01-27 DIAGNOSIS — J45909 Unspecified asthma, uncomplicated: Secondary | ICD-10-CM | POA: Insufficient documentation

## 2021-01-27 DIAGNOSIS — J449 Chronic obstructive pulmonary disease, unspecified: Secondary | ICD-10-CM | POA: Insufficient documentation

## 2021-01-27 DIAGNOSIS — Z96641 Presence of right artificial hip joint: Secondary | ICD-10-CM | POA: Insufficient documentation

## 2021-01-27 DIAGNOSIS — Z20822 Contact with and (suspected) exposure to covid-19: Secondary | ICD-10-CM | POA: Insufficient documentation

## 2021-01-27 DIAGNOSIS — R454 Irritability and anger: Secondary | ICD-10-CM | POA: Diagnosis not present

## 2021-01-27 DIAGNOSIS — F0151 Vascular dementia with behavioral disturbance: Secondary | ICD-10-CM | POA: Insufficient documentation

## 2021-01-27 DIAGNOSIS — Z87891 Personal history of nicotine dependence: Secondary | ICD-10-CM | POA: Diagnosis not present

## 2021-01-27 DIAGNOSIS — I2510 Atherosclerotic heart disease of native coronary artery without angina pectoris: Secondary | ICD-10-CM | POA: Insufficient documentation

## 2021-01-27 DIAGNOSIS — N390 Urinary tract infection, site not specified: Secondary | ICD-10-CM

## 2021-01-27 DIAGNOSIS — N184 Chronic kidney disease, stage 4 (severe): Secondary | ICD-10-CM | POA: Diagnosis not present

## 2021-01-27 DIAGNOSIS — Z7951 Long term (current) use of inhaled steroids: Secondary | ICD-10-CM | POA: Diagnosis not present

## 2021-01-27 DIAGNOSIS — I5022 Chronic systolic (congestive) heart failure: Secondary | ICD-10-CM | POA: Insufficient documentation

## 2021-01-27 DIAGNOSIS — Z8546 Personal history of malignant neoplasm of prostate: Secondary | ICD-10-CM | POA: Diagnosis not present

## 2021-01-27 DIAGNOSIS — Z951 Presence of aortocoronary bypass graft: Secondary | ICD-10-CM | POA: Diagnosis not present

## 2021-01-27 DIAGNOSIS — Z79899 Other long term (current) drug therapy: Secondary | ICD-10-CM | POA: Diagnosis not present

## 2021-01-27 DIAGNOSIS — I130 Hypertensive heart and chronic kidney disease with heart failure and stage 1 through stage 4 chronic kidney disease, or unspecified chronic kidney disease: Secondary | ICD-10-CM | POA: Diagnosis not present

## 2021-01-27 DIAGNOSIS — D329 Benign neoplasm of meninges, unspecified: Secondary | ICD-10-CM

## 2021-01-27 DIAGNOSIS — Z7902 Long term (current) use of antithrombotics/antiplatelets: Secondary | ICD-10-CM | POA: Diagnosis not present

## 2021-01-27 LAB — CBC WITH DIFFERENTIAL/PLATELET
Abs Immature Granulocytes: 0.03 10*3/uL (ref 0.00–0.07)
Basophils Absolute: 0 10*3/uL (ref 0.0–0.1)
Basophils Relative: 0 %
Eosinophils Absolute: 0.1 10*3/uL (ref 0.0–0.5)
Eosinophils Relative: 1 %
HCT: 37.8 % — ABNORMAL LOW (ref 39.0–52.0)
Hemoglobin: 12.1 g/dL — ABNORMAL LOW (ref 13.0–17.0)
Immature Granulocytes: 0 %
Lymphocytes Relative: 17 %
Lymphs Abs: 1.7 10*3/uL (ref 0.7–4.0)
MCH: 28.2 pg (ref 26.0–34.0)
MCHC: 32 g/dL (ref 30.0–36.0)
MCV: 88.1 fL (ref 80.0–100.0)
Monocytes Absolute: 0.9 10*3/uL (ref 0.1–1.0)
Monocytes Relative: 9 %
Neutro Abs: 7.1 10*3/uL (ref 1.7–7.7)
Neutrophils Relative %: 73 %
Platelets: 318 10*3/uL (ref 150–400)
RBC: 4.29 MIL/uL (ref 4.22–5.81)
RDW: 15.3 % (ref 11.5–15.5)
WBC: 9.8 10*3/uL (ref 4.0–10.5)
nRBC: 0 % (ref 0.0–0.2)

## 2021-01-27 LAB — POC SARS CORONAVIRUS 2 AG -  ED: SARS Coronavirus 2 Ag: NEGATIVE

## 2021-01-27 MED ORDER — QUETIAPINE FUMARATE 50 MG PO TABS
50.0000 mg | ORAL_TABLET | Freq: Every day | ORAL | Status: DC
Start: 2021-01-27 — End: 2021-01-30
  Administered 2021-01-29: 50 mg via ORAL
  Filled 2021-01-27 (×2): qty 1

## 2021-01-27 MED ORDER — LORAZEPAM 1 MG PO TABS
1.0000 mg | ORAL_TABLET | Freq: Once | ORAL | Status: DC
  Filled 2021-01-27: qty 1

## 2021-01-27 MED ORDER — IPRATROPIUM BROMIDE 0.03 % NA SOLN
2.0000 | Freq: Every day | NASAL | Status: DC
  Administered 2021-01-28 – 2021-01-30 (×2): 2 via NASAL
  Filled 2021-01-27: qty 30

## 2021-01-27 MED ORDER — FUROSEMIDE 40 MG PO TABS
40.0000 mg | ORAL_TABLET | Freq: Every day | ORAL | Status: DC
  Administered 2021-01-28 – 2021-01-30 (×2): 40 mg via ORAL
  Filled 2021-01-27 (×2): qty 1

## 2021-01-27 MED ORDER — ADULT MULTIVITAMIN W/MINERALS CH
1.0000 | ORAL_TABLET | Freq: Every day | ORAL | Status: DC
  Administered 2021-01-28 – 2021-01-30 (×2): 1 via ORAL
  Filled 2021-01-27 (×2): qty 1

## 2021-01-27 MED ORDER — LORAZEPAM 2 MG/ML IJ SOLN: 1.5000 mg | Freq: Once | INTRAMUSCULAR | Status: AC

## 2021-01-27 MED ORDER — LORAZEPAM 2 MG/ML IJ SOLN
INTRAMUSCULAR | Status: AC
  Administered 2021-01-27: 1.5 mg via INTRAMUSCULAR
  Filled 2021-01-27: qty 1

## 2021-01-27 MED ORDER — NAPROXEN 500 MG PO TABS
500.0000 mg | ORAL_TABLET | Freq: Every day | ORAL | Status: DC
  Administered 2021-01-28 – 2021-01-30 (×2): 500 mg via ORAL
  Filled 2021-01-27 (×2): qty 1

## 2021-01-27 MED ORDER — ISOSORBIDE MONONITRATE ER 30 MG PO TB24
30.0000 mg | ORAL_TABLET | Freq: Every day | ORAL | Status: DC
  Administered 2021-01-28 – 2021-01-30 (×2): 30 mg via ORAL
  Filled 2021-01-27 (×3): qty 1

## 2021-01-27 MED ORDER — AMLODIPINE BESYLATE 5 MG PO TABS
5.0000 mg | ORAL_TABLET | Freq: Every day | ORAL | Status: DC
  Administered 2021-01-28 – 2021-01-30 (×2): 5 mg via ORAL
  Filled 2021-01-27 (×2): qty 1

## 2021-01-27 MED ORDER — LORAZEPAM 0.5 MG PO TABS: 0.5000 mg | ORAL_TABLET | Freq: Once | ORAL | Status: DC

## 2021-01-27 MED ORDER — ATORVASTATIN CALCIUM 40 MG PO TABS
40.0000 mg | ORAL_TABLET | Freq: Every day | ORAL | Status: DC
  Administered 2021-01-28 – 2021-01-30 (×2): 40 mg via ORAL
  Filled 2021-01-27 (×2): qty 1

## 2021-01-27 MED ORDER — CARVEDILOL 3.125 MG PO TABS
6.2500 mg | ORAL_TABLET | Freq: Two times a day (BID) | ORAL | Status: DC
  Administered 2021-01-28 – 2021-01-30 (×3): 6.25 mg via ORAL
  Filled 2021-01-27 (×5): qty 2

## 2021-01-27 MED ORDER — FLUTICASONE PROPIONATE 50 MCG/ACT NA SUSP
2.0000 | Freq: Every day | NASAL | Status: DC
  Administered 2021-01-28 – 2021-01-30 (×2): 2 via NASAL
  Filled 2021-01-27: qty 16
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
## 2021-01-27 NOTE — Discharge Instructions (Addendum)
Follow-up with your doctor as needed  For your behavioral health needs, you are advised to follow up with an outpatient provider.  The providers listed below offer psychiatry/medication management, as well as therapy.  Norma Fredrickson, MD, a geriatric psychiatry specialist, sees patients at both locations:       [facility]. [address] [place], [place] [address]      [phone]       [facility]      [address]      [place], [place] [address]      [phone]

## 2021-01-27 NOTE — BH Assessment (Addendum)
Melissa to fax IVC paperwork, clinician expressed once IVC paperwork id received then clinician to complete pt's TTS assessment.    Vertell Novak, [place], [facility], [facility] Triage Specialist [phone]
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
## 2021-01-28 LAB — COMPREHENSIVE METABOLIC PANEL
ALT: 17 U/L (ref 0–44)
AST: 34 U/L (ref 15–41)
Albumin: 3.7 g/dL (ref 3.5–5.0)
Alkaline Phosphatase: 129 U/L — ABNORMAL HIGH (ref 38–126)
Anion gap: 13 (ref 5–15)
BUN: 34 mg/dL — ABNORMAL HIGH (ref 8–23)
CO2: 22 mmol/L (ref 22–32)
Calcium: 8.8 mg/dL — ABNORMAL LOW (ref 8.9–10.3)
Chloride: 104 mmol/L (ref 98–111)
Creatinine, Ser: 2.96 mg/dL — ABNORMAL HIGH (ref 0.61–1.24)
GFR, Estimated: 20 mL/min — ABNORMAL LOW (ref >60)
Glucose, Bld: 91 mg/dL (ref 70–99)
Potassium: 4.3 mmol/L (ref 3.5–5.1)
Sodium: 139 mmol/L (ref 135–145)
Total Bilirubin: 1 mg/dL (ref 0.3–1.2)
Total Protein: 7.8 g/dL (ref 6.5–8.1)

## 2021-01-28 LAB — URINALYSIS, ROUTINE W REFLEX MICROSCOPIC
Bilirubin Urine: NEGATIVE
Glucose, UA: NEGATIVE mg/dL
Ketones, ur: 5 mg/dL — AB
Nitrite: NEGATIVE
Protein, ur: 100 mg/dL — AB
Specific Gravity, Urine: 1.014 (ref 1.005–1.030)
WBC, UA: >50 WBC/hpf — ABNORMAL HIGH (ref 0–5)
pH: 5 (ref 5.0–8.0)

## 2021-01-28 LAB — RAPID URINE DRUG SCREEN, HOSP PERFORMED
Amphetamines: NOT DETECTED
Barbiturates: NOT DETECTED
Benzodiazepines: NOT DETECTED
Cocaine: NOT DETECTED
Opiates: POSITIVE — AB
Tetrahydrocannabinol: NOT DETECTED

## 2021-01-28 LAB — ETHANOL: Alcohol, Ethyl (B): <10 mg/dL (ref <10)

## 2021-01-28 LAB — RESP PANEL BY RT-PCR (FLU A&B, COVID) ARPGX2
Influenza A by PCR: NEGATIVE
Influenza B by PCR: NEGATIVE
SARS Coronavirus 2 by RT PCR: NEGATIVE

## 2021-01-28 MED ORDER — MAGNESIUM OXIDE 400 (241.3 MG) MG PO TABS
400.0000 mg | ORAL_TABLET | Freq: Every day | ORAL | Status: DC
  Administered 2021-01-28 – 2021-01-30 (×2): 400 mg via ORAL
  Filled 2021-01-28 (×2): qty 1

## 2021-01-28 MED ORDER — CLOPIDOGREL BISULFATE 75 MG PO TABS
75.0000 mg | ORAL_TABLET | Freq: Every day | ORAL | Status: DC
  Administered 2021-01-28 – 2021-01-30 (×2): 75 mg via ORAL
  Filled 2021-01-28 (×3): qty 1

## 2021-01-28 MED ORDER — FLUTICASONE-UMECLIDIN-VILANT 200-62.5-25 MCG/INH IN AEPB: 1.0000 | INHALATION_SPRAY | Freq: Every day | RESPIRATORY_TRACT | Status: DC

## 2021-01-28 MED ORDER — UMECLIDINIUM BROMIDE 62.5 MCG/INH IN AEPB
1.0000 | INHALATION_SPRAY | Freq: Every day | RESPIRATORY_TRACT | Status: DC
  Administered 2021-01-30: 1 via RESPIRATORY_TRACT
  Filled 2021-01-28: qty 7

## 2021-01-28 MED ORDER — ENSURE ENLIVE PO LIQD
237.0000 mL | Freq: Three times a day (TID) | ORAL | Status: DC
  Administered 2021-01-28 – 2021-01-30 (×5): 237 mL via ORAL
  Filled 2021-01-28 (×10): qty 237

## 2021-01-28 MED ORDER — ALBUTEROL SULFATE HFA 108 (90 BASE) MCG/ACT IN AERS: 2.0000 | INHALATION_SPRAY | RESPIRATORY_TRACT | Status: DC | PRN

## 2021-01-28 MED ORDER — ASPIRIN EC 81 MG PO TBEC
81.0000 mg | DELAYED_RELEASE_TABLET | Freq: Every day | ORAL | Status: DC
  Administered 2021-01-28 – 2021-01-30 (×3): 81 mg via ORAL
  Filled 2021-01-28 (×3): qty 1

## 2021-01-28 MED ORDER — FLUTICASONE FUROATE-VILANTEROL 100-25 MCG/INH IN AEPB
1.0000 | INHALATION_SPRAY | Freq: Every day | RESPIRATORY_TRACT | Status: DC
  Administered 2021-01-30: 1 via RESPIRATORY_TRACT
  Filled 2021-01-28: qty 28

## 2021-01-28 MED ORDER — CEPHALEXIN 500 MG PO CAPS
500.0000 mg | ORAL_CAPSULE | Freq: Two times a day (BID) | ORAL | Status: DC
  Administered 2021-01-28 – 2021-01-30 (×4): 500 mg via ORAL
  Filled 2021-01-28 (×5): qty 1
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
## 2021-01-28 NOTE — ED Provider Notes (Signed)
Nursing notes and vitals signs, including pulse oximetry, reviewed.  Summary of this visit's results, reviewed by myself:  EKG:  EKG Interpretation  Date/Time:    Ventricular Rate:    PR Interval:    QRS Duration:   QT Interval:    QTC Calculation:   R Axis:     Text Interpretation:         Labs:  Results for orders placed or performed during the hospital encounter of 01/27/21 (from the past 24 hour(s))  POC SARS Coronavirus 2 Ag-ED - Nasal Swab     Status: None   Collection Time: 01/27/21  9:52 PM  Result Value Ref Range   SARS Coronavirus 2 Ag NEGATIVE NEGATIVE  Ethanol     Status: None   Collection Time: 01/27/21 11:10 PM  Result Value Ref Range   Alcohol, Ethyl (B) <10 <10 mg/dL  CBC with Differential/Platelet     Status: Abnormal   Collection Time: 01/27/21 11:10 PM  Result Value Ref Range   WBC 9.8 4.0 - 10.5 K/uL   RBC 4.29 4.22 - 5.81 MIL/uL   Hemoglobin 12.1 (L) 13.0 - 17.0 g/dL   HCT 37.8 (L) 39.0 - 52.0 %   MCV 88.1 80.0 - 100.0 fL   MCH 28.2 26.0 - 34.0 pg   MCHC 32.0 30.0 - 36.0 g/dL   RDW 15.3 11.5 - 15.5 %   Platelets 318 150 - 400 K/uL   nRBC 0.0 0.0 - 0.2 %   Neutrophils Relative % 73 %   Neutro Abs 7.1 1.7 - 7.7 K/uL   Lymphocytes Relative 17 %   Lymphs Abs 1.7 0.7 - 4.0 K/uL   Monocytes Relative 9 %   Monocytes Absolute 0.9 0.1 - 1.0 K/uL   Eosinophils Relative 1 %   Eosinophils Absolute 0.1 0.0 - 0.5 K/uL   Basophils Relative 0 %   Basophils Absolute 0.0 0.0 - 0.1 K/uL   Immature Granulocytes 0 %   Abs Immature Granulocytes 0.03 0.00 - 0.07 K/uL  Comprehensive metabolic panel     Status: Abnormal   Collection Time: 01/27/21 11:10 PM  Result Value Ref Range   Sodium 139 135 - 145 mmol/L   Potassium 4.3 3.5 - 5.1 mmol/L   Chloride 104 98 - 111 mmol/L   CO2 22 22 - 32 mmol/L   Glucose, Bld 91 70 - 99 mg/dL   BUN 34 (H) 8 - 23 mg/dL   Creatinine, Ser 2.96 (H) 0.61 - 1.24 mg/dL   Calcium 8.8 (L) 8.9 - 10.3 mg/dL   Total Protein 7.8 6.5 -  8.1 g/dL   Albumin 3.7 3.5 - 5.0 g/dL   AST 34 15 - 41 U/L   ALT 17 0 - 44 U/L   Alkaline Phosphatase 129 (H) 38 - 126 U/L   Total Bilirubin 1.0 0.3 - 1.2 mg/dL   GFR, Estimated 20 (L) >60 mL/min   Anion gap 13 5 - 15  Rapid urine drug screen (hospital performed)     Status: Abnormal   Collection Time: 01/28/21  1:00 AM  Result Value Ref Range   Opiates POSITIVE (A) NONE DETECTED   Cocaine NONE DETECTED NONE DETECTED   Benzodiazepines NONE DETECTED NONE DETECTED   Amphetamines NONE DETECTED NONE DETECTED   Tetrahydrocannabinol NONE DETECTED NONE DETECTED   Barbiturates NONE DETECTED NONE DETECTED  Urinalysis, Routine w reflex microscopic Urine, Clean Catch     Status: Abnormal   Collection Time: 01/28/21  1:00 AM  Result Value  Ref Range   Color, Urine YELLOW YELLOW   APPearance CLOUDY (A) CLEAR   Specific Gravity, Urine 1.014 1.005 - 1.030   pH 5.0 5.0 - 8.0   Glucose, UA NEGATIVE NEGATIVE mg/dL   Hgb urine dipstick SMALL (A) NEGATIVE   Bilirubin Urine NEGATIVE NEGATIVE   Ketones, ur 5 (A) NEGATIVE mg/dL   Protein, ur 100 (A) NEGATIVE mg/dL   Nitrite NEGATIVE NEGATIVE   Leukocytes,Ua MODERATE (A) NEGATIVE   RBC / HPF 11-20 0 - 5 RBC/hpf   WBC, UA >50 (H) 0 - 5 WBC/hpf   Bacteria, UA MANY (A) NONE SEEN   Squamous Epithelial / LPF 0-5 0 - 5   Amorphous Crystal PRESENT   Resp Panel by RT-PCR (Flu A&B, Covid) Nasopharyngeal Swab     Status: None   Collection Time: 01/28/21  1:00 AM   Specimen: Nasopharyngeal Swab; Nasopharyngeal(NP) swabs in vial transport medium  Result Value Ref Range   SARS Coronavirus 2 by RT PCR NEGATIVE NEGATIVE   Influenza A by PCR NEGATIVE NEGATIVE   Influenza B by PCR NEGATIVE NEGATIVE    Imaging Studies: CT Head Wo Contrast  Result Date: 01/27/2021 CLINICAL DATA:  Acute mental status changes.  Confusion. EXAM: CT HEAD WITHOUT CONTRAST TECHNIQUE: Contiguous axial images were obtained from the base of the skull through the vertex without  intravenous contrast. COMPARISON:  01/19/2018 FINDINGS: Brain: No abnormality affects the brainstem or cerebellum. There is a meningioma arising from the left upper tentorium with areas of calcification. This has enlarged slightly since 2019. Dimension at that time was approximately 31 x 36 mm. Measured in the same locations today, the measurements are 34 x 38 mm. There is mass-effect upon the posteromedial left temporal lobe and occipital lobe, but without brain edema or any midline shift. Cerebral hemispheres otherwise show minimal small vessel change of the white matter. No hydrocephalus, hemorrhage or extra-axial collection. Vascular: There is atherosclerotic calcification of the major vessels at the base of the brain. Skull: Negative Sinuses/Orbits: Clear/normal Other: None IMPRESSION: 1. No acute finding by CT. 2. Slowly enlarging meningioma arising from the left upper tentorium with mild mass-effect upon the posteromedial left temporal lobe and occipital lobe, but without brain edema or any midline shift. Electronically Signed   By: Nelson Chimes M.D.   On: 01/27/2021 23:55   Patient has a urinary tract infection.  We will start him on Keflex.  Urine has been sent for culture.  TTS is recommending geriatric psychiatric admission.     Shanon Rosser, MD 01/28/21 [phone]

## 2021-01-28 NOTE — BH Assessment (Addendum)
Per [name], she's still waiting on the notary before she can fax the IVC paperwork.   Clinician Geroge Baseman, RN via secure chat in Epic, that she's ready to complete the pt's assessment.    Vertell Novak, [place], [facility], [facility] Triage Specialist [phone]
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
## 2021-01-28 NOTE — BH Assessment (Signed)
[facility] Assessment Progress Note  Per Letitia Libra, NP, this pt does not require psychiatric hospitalization at this time.  Pt presents under IVC initiated by EDP Lacretia Leigh, MD which has been rescinded by EDP Aletta Edouard, MD.  Pt is psychiatrically cleared.  Discharge instructions include referral information for area behavioral health providers, including those offering geriatric psychiatry.  A TOC consult has been ordered to address pt's psychosocial needs.  Dr Melina Copa and pt's nurse, Abby, have been notified.  Jalene Mullet, [place] Triage Specialist [phone]

## 2021-01-28 NOTE — Progress Notes (Signed)
TOC CM/CSW attempted to contact pts legal guardian/[name] [phone].  CSW left HIPPA compliant message that pt was returning home with my contact information.  CSW will continue to follow for dc needs.  [name], MSW, LCSW-A Pronouns:  She, Her, Hers                  [name] ED Transitions of CareClinical Social Worker [name] [phone]

## 2021-01-28 NOTE — TOC Initial Note (Addendum)
Transition of Care [facility]) - Initial/Assessment Note    Patient Details  Name: [name] MRN: [id number] Date of Birth: Feb 03, 1935  Transition of Care [facility]) CM/SW Contact:    Erenest Rasher, RN Phone Number: [phone] 01/28/2021, 5:47 PM  Clinical Narrative:                  TOC CM consulted for pt for SNF vs HH. Pt was recently at [facility] but was discharged due to aggressive behaviors. Pt seen by psych and was cleared by psych. Spoke to dtr, [name] to discuss dc home with [facility]. States pt lives alone and takes his meds independently. She will check on him later today. TOC CM spoke to pt and states he does not know if he has apt. Does not have keys in room. TOC CM attempted call back to dtr, left HIPAA complaint message for return call to verify apt and to bring his keys.   PT evaluation scheduled. Attempted call to [facility] PT dept to see if they could see pt today.   TOC CM contacted PT for evaluation. [place] for Rollator for home. Rollator to be delivered to room. Dtr will have to pick up RW with seat as [facility] will not be able to transport DME due to safety issues     Expected Discharge Plan: [place] Barriers to Discharge: ED DME delivery   Patient Goals and CMS Choice Patient states their goals for this hospitalization and ongoing recovery are:: patient requesting to discharge home      Expected Discharge Plan and Services Expected Discharge Plan: [place] In-house Referral: Clinical Social Work Discharge Planning Services: CM Consult Post Acute Care Choice: [place] arrangements for the past 2 months: Apartment                   DME Agency: [employer] Date DME Agency Contacted: 01/28/21 Time DME Agency Contacted: [phone] Representative spoke with at DME Agency: Freda Munro [facility] Arranged: PT,OT,Nurse's Aide          Prior Living Arrangements/Services Living arrangements for the past 2 months:  Apartment Lives with:: Self Patient language and need for interpreter reviewed:: Yes Do you feel safe going back to the place where you live?: Yes      Need for Family Participation in Patient Care: Yes (Comment) Care giver support system in place?: Yes (comment) Current home services: DME (cane) Criminal Activity/Legal Involvement Pertinent to Current Situation/Hospitalization: No - Comment as needed  Activities of Daily Living      Permission Sought/Granted Permission sought to share information with : Case Manager,PCP,Family Supports,Facility Contact Representative Permission granted to share information with : Yes, Verbal Permission Granted  Share Information with NAME: [place] granted to share info w AGENCY: [place] granted to share info w Relationship: daughter  Permission granted to share info w Contact Information: [phone]  Emotional Assessment Appearance:: Appears stated age   Affect (typically observed): Accepting Orientation: : Oriented to Self   Psych Involvement: No (comment)  Admission diagnosis:  psych eval Patient Active Problem List   Diagnosis Date Noted  . Morbid obesity ([place]) 01/10/2021  . Right calcaneal fracture 01/10/2021  . PVD (peripheral vascular disease) ([place]) 01/10/2021  . Chronic systolic CHF (congestive heart failure) ([place]) 01/10/2021  . Hip fracture ([place]) 01/08/2021  . Primary osteoarthritis of left knee 01/06/2021  . Arthropathy of left knee 01/06/2021  .  Chronic pain of left knee 01/06/2021  . Class 3 obesity with alveolar hypoventilation and body mass index (BMI) of 40.0 to 44.9 in adult ([place]) 12/23/2020  . Hip joint effusion (Left) 12/23/2020  . History of alcohol abuse 11/29/2020  . Elevated rheumatoid factor 11/29/2020  . Polyarthritis with positive rheumatoid factor ([place]) 11/17/2020  . Elevated uric acid in blood 11/17/2020  . CKD (chronic kidney disease), stage IV ([place]) 10/26/2020  .  Proteinuria, unspecified 10/26/2020  . Elevated BUN 10/26/2020  . Elevated serum creatinine 10/26/2020  . Hypochloremia 10/26/2020  . Hypocalcemia 10/26/2020  . Hypoalbuminemia 10/26/2020  . Elevated alkaline phosphatase level 10/26/2020  . Elevated hemoglobin A1c 10/26/2020  . Elevated random blood glucose level 10/26/2020  . Anemia in stage 3b chronic kidney disease ([place]) 10/26/2020  .  Klippel-Feil deformity at the C5-C7 levels 10/26/2020  . Carotid artery calcification, bilateral 10/26/2020  . History of 2.0 cm thyroid mass (Left lobe) 10/26/2020    Class: History of  . Bilateral renal cysts 10/26/2020  . Lumbosacral spondylosis 10/26/2020  . Osteoarthritis of facet joint of lumbar spine 10/26/2020  . DDD (degenerative disc disease), lumbosacral 10/26/2020  . History of total hip replacement (Right) 10/26/2020  . Osteoarthritis of hip (Left) 10/26/2020  . Diverticulosis of sigmoid colon 10/26/2020  . Abnormal MRI, lumbar spine (10/07/2020) 10/26/2020  . Lumbar central spinal stenosis with neurogenic claudication 10/26/2020  . Lumbar foraminal stenosis 10/26/2020  . Elevated C-reactive protein (CRP) 10/25/2020  . Elevated sed rate 10/25/2020  . Vitamin D deficiency 10/25/2020  . Chronic anticoagulation (Plavix) 09/20/2020  . Left hip pain 09/20/2020  . Unilateral post-traumatic osteoarthritis of hip (Left) 09/20/2020  . Chronic knee pain (2ry area of Pain) (Bilateral) 09/20/2020  . Secondary osteoarthritis of knee (Bilateral) 09/20/2020  . Osteoarthritis of knees (Bilateral) 09/20/2020  . Chronic constipation 09/20/2020  . Chronic shoulder pain (3ry area of Pain) (Bilateral) 09/20/2020  . Grade 1 (3 mm) anterolisthesis of lumbar spine (L4-5) (stable) 09/20/2020  . Chronic groin pain (Left) 09/20/2020  . Other intervertebral disc degeneration, lumbar region 09/20/2020  . Neurogenic pain 09/20/2020  . Chronic pain syndrome 09/18/2020  . Pharmacologic therapy 09/18/2020  .  Disorder of skeletal system 09/18/2020  . Problems influencing health status 09/18/2020  . Lower extremity pain, bilateral 05/15/2018  . Elevated troponin I level 07/31/2017  . Hyperkalemia 05/28/2017  . Syncope 05/27/2017  . Meningioma ([place]) 01/05/2017  . GI bleed 12/01/2016  . Alcohol abuse 10/30/2016  . COPD (chronic obstructive pulmonary disease) with chronic bronchitis ([place]) 10/04/2016  . Obstructive sleep apnea 10/04/2016  . Hyponatremia 08/31/2016  . Shortness of breath 02/11/2016  . Hyperlipidemia 12/03/2015  . Essential hypertension   . Coronary artery disease 01/19/2004   PCP:  Marguerita Merles, MD Pharmacy:   [place], [place] - [address] [address] Phone: [phone] Fax: [phone]     Social Determinants of Health (SDOH) Interventions    Readmission Risk Interventions No flowsheet data found.

## 2021-01-28 NOTE — Progress Notes (Signed)
TOC CM spoke to pt and states he uses his RW at home. States he doesn't know if he still has his apt. Attempted call to dtr, and left HIPAA compliant message for return call to verify pt's address. Pt does not have his keys to the apt. Will need to confirm from dtr that pt has apt and will need keys.   TOC CM contacted PT for evaluation. [place] for Rollator for home. Rollator to be delivered to room. Dtr will have to pick up RW with seat as [facility] will not be able to transport DME due to safety issues. [place], [place] ED TOC CM [phone]

## 2021-01-28 NOTE — ED Provider Notes (Signed)
Emergency Medicine Observation Re-evaluation Note  [name] is a 86 y.o. male, seen on rounds today.  Pt initially presented to the ED for complaints of Psychiatric Evaluation Currently, the patient is resting quietly.  Physical Exam  BP (!) 160/71 (BP Location: Left Arm)   Pulse 79   Temp 97.8 F (36.6 C) (Oral)   Resp 18   Ht 5\' 7"  (1.702 m)   Wt 125.6 kg   SpO2 95%   BMI 43.37 kg/m  Physical Exam General: No acute distress Cardiac: Well perfused Lungs: Nonlabored Psych: Cooperative  ED Course / MDM  EKG:    I have reviewed the labs performed to date as well as medications administered while in observation.  Recent changes in the last 24 hours include medical clearance and behavioral health evaluation. UTI identified and patient placed on antibiotics Plan  Current plan is for [facility] psych placement. Patient is not under full IVC at this time.  1:30 PM. Received call from Tonette Bihari. They are psychiatrically clearing patient. TOC needs to be involved to arrange return to his facility. They are making recommendations for outpatient Geri psych counseling.    Hayden Rasmussen, MD 01/29/21 [phone]

## 2021-01-28 NOTE — Progress Notes (Signed)
TOC CM spoke to pt's dtr, and she is agreement to dc home with [facility]. States pt lives alone and she will available to check on him. He has Rollator at home but will need new Rollator. [place], [place] ED TOC CM [phone]

## 2021-01-28 NOTE — Progress Notes (Signed)
TOC CM/CSW received a call from [facility].  Claiborne Billings stated pt cld not return to [facility].  Pt has been to [facility] and [facility].    CSW will continue to follow for dc needs.  Kalani Baray Tarpley-Carter, MSW, LCSW-A Pronouns:  She, Her, [place] ED Transitions of CareClinical Social Worker [email or web address] [phone]

## 2021-01-28 NOTE — TOC Progression Note (Signed)
CSW made another attempt to contact pts daughter/ legal guardian [place] at [phone], CSW left VM asking for call back.

## 2021-01-29 NOTE — ED Notes (Signed)
Pt asked multiple times if he would take meds, refused each occasion.

## 2021-01-29 NOTE — ED Notes (Addendum)
Pt refused lunch tray and denying vitals to be taken at this time.

## 2021-01-29 NOTE — Progress Notes (Addendum)
CSW attempted to reach patient's daughter [name] via phone without success. CSW communicated with daughter via text - [name] reports her father still has his apartment, but that his keys and cell phone are still at [employer]. [name] agreeable to come pick up the DME and take it to his home this afternoon. [name] requesting ambulance transport home as the patient has difficulty getting in and out of her vehicle.  CSW spoke with Juliann Pulse at [employer] who confirmed the patient's personal items are still at the facility - CSW advised [name] she can go to the facility to pick up the items.  CSW initiated insurance authorization from [employer] for ambulance transport - waiting on return call for decision.  Madilyn Fireman, MSW, LCSW Transitions of Care  Clinical Social Worker II [phone]

## 2021-01-29 NOTE — ED Notes (Addendum)
Patient refused to have his vitals  2 techs attempted to collect vitals and patient ripped and broke the BP cuff RN notified

## 2021-01-29 NOTE — ED Notes (Signed)
Meal tray at bedside.  

## 2021-01-29 NOTE — ED Notes (Signed)
Pt removed all monitor leads and refusing continuing vitals.

## 2021-01-29 NOTE — ED Notes (Signed)
Pt continues to be uncooperative and refuses to allow vitals to be taken

## 2021-01-29 NOTE — Progress Notes (Signed)
PT Cancellation Note  Patient Details Name: [name] MRN: [id number] DOB: [birth year]   Cancelled Treatment:    Reason Eval/Treat Not Completed: Patient lying in bed, does not acknowledge PT presence. Attempted to arouse by shaking bed.Patient did not participate.  [place] Pager [phone] Office [phone]

## 2021-01-30 LAB — URINE CULTURE: Culture: >=100000 — AB

## 2021-01-30 NOTE — ED Notes (Signed)
Patient daughter call for update but no one answer the phone.

## 2021-01-30 NOTE — Progress Notes (Signed)
CSW received text from guardian stating that patient's apartment is unlocked in anticipation of his arrival. Verdell Face, daughter [place], further states that should any issues arrive that Pt has a friend named Hassan Rowan who lives in same building who is willing to assist Pt upon arrival. CSW staffed case with [facility] Supervisor on call Andreas [place] and updated Pt.

## 2021-01-30 NOTE — Progress Notes (Signed)
10:47 CSW called daughter in an effort to get update about status of keys. Left HIPAA compliant voicemail.       9:07 CSW called [employer] to enquire about the status of Pt's personal belongings so that daughter can gather Pt's keys to get him back to his apartment. Facility states they are looking into matter and will call back.

## 2021-01-30 NOTE — Progress Notes (Signed)
11:38  CSW called [employer] and spoke with maintenance personal who state that they are able to let Pt into apartment and supply new lock/key.  Since CSW is unable to reach daughter via phone, CSW attempted to relay this information to daughter via text.  CSW again asked daughter to contact CSW as Pt has been ready for d/c for some time.    11:16CSW called [facility] care and spoke with Otila Kluver who states that supervisor is still looking for Pt's house keys.

## 2021-01-30 NOTE — Progress Notes (Signed)
CSW spoke with Pt's daughter via phone. Daughter will coordinate with building manager to get Pt back into apartment. CSW spoke with Freda Munro for [employer]. Pt is not eligible for rollator due to recently getting one. CSW informed daughter and informed daughter of other options for obtaining one privately.  CSW chatted floor RN to update.

## 2021-01-30 NOTE — Progress Notes (Signed)
PT Cancellation Note  Patient Details Name: [name] MRN: [id number] DOB: March 29, 1935   Cancelled Treatment:     PT eval re-attempted but pt continues to refuse participation.  Pt agreeable only to getting another blanket.  Will follow.   Yash Cacciola 01/30/2021, 11:11 AM

## 2021-01-30 NOTE — ED Notes (Signed)
Patient found laying in a wet bed. Complete bed change done and patient was clean up.

## 2021-01-30 NOTE — ED Notes (Signed)
[facility] call to transport patient home. 

## 2021-01-30 NOTE — Progress Notes (Signed)
CSW called Pt's daughter @ [phone] Left HIPAA compliant voicemail requesting callback.

## 2021-01-31 ENCOUNTER — Telehealth: Payer: Self-pay | Admitting: Emergency Medicine

## 2021-01-31 NOTE — Telephone Encounter (Signed)
Post ED Visit - Positive Culture Follow-up  Culture report reviewed by antimicrobial stewardship pharmacist: [place] Team []  [address], Pharm.D. []  Heide Guile, Pharm.D., BCPS AQ-ID []  Parks Neptune, Pharm.D., BCPS []  Alycia Rossetti, Pharm.D., BCPS []  [place], Pharm.D., BCPS, AAHIVP []  Legrand Como, Pharm.D., BCPS, AAHIVP []  Salome Arnt, PharmD, BCPS []  Johnnette Gourd, PharmD, BCPS []  Hughes Better, PharmD, BCPS []  Leeroy Cha, PharmD []  Laqueta Linden, PharmD, BCPS []  Albertina Parr, PharmD  [place] Team []  Leodis Sias, PharmD []  Lindell Spar, PharmD []  Royetta Asal, PharmD []  Graylin Shiver, Rph []  Rema Fendt) Glennon Mac, PharmD []  Arlyn Dunning, PharmD []  Netta Cedars, PharmD []  Dia Sitter, PharmD []  Leone [place], PharmD []  Gretta Arab, PharmD []  Theodis Shove, PharmD []  Peggyann Juba, PharmD []  Reuel Boom, PharmD   Positive urine culture Treated with keflex, organism sensitive to the same and no further patient follow-up is required at this time.  Hazle Nordmann 01/31/2021, 11:52 AM

## 2021-02-11 ENCOUNTER — Inpatient Hospital Stay
Admission: EM | Admit: 2021-02-11 | Discharge: 2021-02-17 | DRG: 178 | Disposition: A | Discharge status: Skilled Nursing Facility | Payer: HMO | Attending: Student | Admitting: Student

## 2021-02-11 ENCOUNTER — Other Ambulatory Visit: Payer: Self-pay

## 2021-02-11 ENCOUNTER — Emergency Department: Payer: HMO

## 2021-02-11 DIAGNOSIS — Z7951 Long term (current) use of inhaled steroids: Secondary | ICD-10-CM

## 2021-02-11 DIAGNOSIS — F1010 Alcohol abuse, uncomplicated: Secondary | ICD-10-CM | POA: Diagnosis present

## 2021-02-11 DIAGNOSIS — G629 Polyneuropathy, unspecified: Secondary | ICD-10-CM | POA: Diagnosis present

## 2021-02-11 DIAGNOSIS — E785 Hyperlipidemia, unspecified: Secondary | ICD-10-CM | POA: Diagnosis present

## 2021-02-11 DIAGNOSIS — Z7982 Long term (current) use of aspirin: Secondary | ICD-10-CM

## 2021-02-11 DIAGNOSIS — F1011 Alcohol abuse, in remission: Secondary | ICD-10-CM | POA: Diagnosis present

## 2021-02-11 DIAGNOSIS — I5042 Chronic combined systolic (congestive) and diastolic (congestive) heart failure: Secondary | ICD-10-CM | POA: Diagnosis present

## 2021-02-11 DIAGNOSIS — E66813 Obesity, class 3: Secondary | ICD-10-CM

## 2021-02-11 DIAGNOSIS — R8271 Bacteriuria: Secondary | ICD-10-CM | POA: Diagnosis present

## 2021-02-11 DIAGNOSIS — E871 Hypo-osmolality and hyponatremia: Secondary | ICD-10-CM | POA: Diagnosis present

## 2021-02-11 DIAGNOSIS — Z951 Presence of aortocoronary bypass graft: Secondary | ICD-10-CM

## 2021-02-11 DIAGNOSIS — D329 Benign neoplasm of meninges, unspecified: Secondary | ICD-10-CM | POA: Diagnosis present

## 2021-02-11 DIAGNOSIS — G4733 Obstructive sleep apnea (adult) (pediatric): Secondary | ICD-10-CM | POA: Diagnosis present

## 2021-02-11 DIAGNOSIS — E662 Morbid (severe) obesity with alveolar hypoventilation: Secondary | ICD-10-CM | POA: Diagnosis present

## 2021-02-11 DIAGNOSIS — U071 COVID-19: Principal | ICD-10-CM | POA: Diagnosis present

## 2021-02-11 DIAGNOSIS — B952 Enterococcus as the cause of diseases classified elsewhere: Secondary | ICD-10-CM | POA: Diagnosis present

## 2021-02-11 DIAGNOSIS — I2510 Atherosclerotic heart disease of native coronary artery without angina pectoris: Secondary | ICD-10-CM | POA: Diagnosis present

## 2021-02-11 DIAGNOSIS — N184 Chronic kidney disease, stage 4 (severe): Secondary | ICD-10-CM | POA: Diagnosis present

## 2021-02-11 DIAGNOSIS — Z83438 Family history of other disorder of lipoprotein metabolism and other lipidemia: Secondary | ICD-10-CM

## 2021-02-11 DIAGNOSIS — Z8546 Personal history of malignant neoplasm of prostate: Secondary | ICD-10-CM

## 2021-02-11 DIAGNOSIS — F1020 Alcohol dependence, uncomplicated: Secondary | ICD-10-CM | POA: Diagnosis present

## 2021-02-11 DIAGNOSIS — K5909 Other constipation: Secondary | ICD-10-CM | POA: Diagnosis present

## 2021-02-11 DIAGNOSIS — I255 Ischemic cardiomyopathy: Secondary | ICD-10-CM | POA: Diagnosis present

## 2021-02-11 DIAGNOSIS — I130 Hypertensive heart and chronic kidney disease with heart failure and stage 1 through stage 4 chronic kidney disease, or unspecified chronic kidney disease: Secondary | ICD-10-CM | POA: Diagnosis present

## 2021-02-11 DIAGNOSIS — Z955 Presence of coronary angioplasty implant and graft: Secondary | ICD-10-CM

## 2021-02-11 DIAGNOSIS — Z96641 Presence of right artificial hip joint: Secondary | ICD-10-CM | POA: Diagnosis present

## 2021-02-11 DIAGNOSIS — Z8249 Family history of ischemic heart disease and other diseases of the circulatory system: Secondary | ICD-10-CM

## 2021-02-11 DIAGNOSIS — Z87891 Personal history of nicotine dependence: Secondary | ICD-10-CM

## 2021-02-11 DIAGNOSIS — Z6841 Body Mass Index (BMI) 40.0 and over, adult: Secondary | ICD-10-CM

## 2021-02-11 DIAGNOSIS — R531 Weakness: Secondary | ICD-10-CM

## 2021-02-11 DIAGNOSIS — R296 Repeated falls: Secondary | ICD-10-CM | POA: Diagnosis present

## 2021-02-11 DIAGNOSIS — I252 Old myocardial infarction: Secondary | ICD-10-CM

## 2021-02-11 DIAGNOSIS — Z7902 Long term (current) use of antithrombotics/antiplatelets: Secondary | ICD-10-CM

## 2021-02-11 DIAGNOSIS — Z9119 Patient's noncompliance with other medical treatment and regimen: Secondary | ICD-10-CM

## 2021-02-11 DIAGNOSIS — Z79899 Other long term (current) drug therapy: Secondary | ICD-10-CM

## 2021-02-11 DIAGNOSIS — J449 Chronic obstructive pulmonary disease, unspecified: Secondary | ICD-10-CM | POA: Diagnosis present

## 2021-02-11 DIAGNOSIS — I10 Essential (primary) hypertension: Secondary | ICD-10-CM | POA: Diagnosis present

## 2021-02-11 DIAGNOSIS — F319 Bipolar disorder, unspecified: Secondary | ICD-10-CM | POA: Diagnosis present

## 2021-02-11 DIAGNOSIS — G4700 Insomnia, unspecified: Secondary | ICD-10-CM | POA: Diagnosis present

## 2021-02-11 DIAGNOSIS — Z1611 Resistance to penicillins: Secondary | ICD-10-CM | POA: Diagnosis present

## 2021-02-11 LAB — CBC WITH DIFFERENTIAL/PLATELET
Abs Immature Granulocytes: 0.14 10*3/uL — ABNORMAL HIGH (ref 0.00–0.07)
Basophils Absolute: 0 10*3/uL (ref 0.0–0.1)
Basophils Relative: 0 %
Eosinophils Absolute: 0.1 10*3/uL (ref 0.0–0.5)
Eosinophils Relative: 1 %
HCT: 30 % — ABNORMAL LOW (ref 39.0–52.0)
Hemoglobin: 10.1 g/dL — ABNORMAL LOW (ref 13.0–17.0)
Immature Granulocytes: 2 %
Lymphocytes Relative: 14 %
Lymphs Abs: 1.2 10*3/uL (ref 0.7–4.0)
MCH: 28.1 pg (ref 26.0–34.0)
MCHC: 33.7 g/dL (ref 30.0–36.0)
MCV: 83.3 fL (ref 80.0–100.0)
Monocytes Absolute: 0.9 10*3/uL (ref 0.1–1.0)
Monocytes Relative: 12 %
Neutro Abs: 5.7 10*3/uL (ref 1.7–7.7)
Neutrophils Relative %: 71 %
Platelets: 240 10*3/uL (ref 150–400)
RBC: 3.6 MIL/uL — ABNORMAL LOW (ref 4.22–5.81)
RDW: 15.2 % (ref 11.5–15.5)
WBC: 8.1 10*3/uL (ref 4.0–10.5)
nRBC: 0 % (ref 0.0–0.2)

## 2021-02-11 LAB — COMPREHENSIVE METABOLIC PANEL
ALT: 55 U/L — ABNORMAL HIGH (ref 0–44)
AST: 33 U/L (ref 15–41)
Albumin: 2.9 g/dL — ABNORMAL LOW (ref 3.5–5.0)
Alkaline Phosphatase: 111 U/L (ref 38–126)
Anion gap: 10 (ref 5–15)
BUN: 35 mg/dL — ABNORMAL HIGH (ref 8–23)
CO2: 23 mmol/L (ref 22–32)
Calcium: 8.3 mg/dL — ABNORMAL LOW (ref 8.9–10.3)
Chloride: 94 mmol/L — ABNORMAL LOW (ref 98–111)
Creatinine, Ser: 2.28 mg/dL — ABNORMAL HIGH (ref 0.61–1.24)
GFR, Estimated: 28 mL/min — ABNORMAL LOW (ref >60)
Glucose, Bld: 116 mg/dL — ABNORMAL HIGH (ref 70–99)
Potassium: 4 mmol/L (ref 3.5–5.1)
Sodium: 127 mmol/L — ABNORMAL LOW (ref 135–145)
Total Bilirubin: 1.1 mg/dL (ref 0.3–1.2)
Total Protein: 6.9 g/dL (ref 6.5–8.1)

## 2021-02-11 LAB — URINALYSIS, COMPLETE (UACMP) WITH MICROSCOPIC
Bilirubin Urine: NEGATIVE
Glucose, UA: NEGATIVE mg/dL
Ketones, ur: NEGATIVE mg/dL
Nitrite: NEGATIVE
Protein, ur: 30 mg/dL — AB
Specific Gravity, Urine: 1.008 (ref 1.005–1.030)
pH: 5 (ref 5.0–8.0)

## 2021-02-11 LAB — TSH: TSH: 1.388 u[IU]/mL (ref 0.350–4.500)

## 2021-02-11 LAB — PROCALCITONIN: Procalcitonin: 1.29 ng/mL

## 2021-02-11 LAB — RESP PANEL BY RT-PCR (FLU A&B, COVID) ARPGX2
Influenza A by PCR: NEGATIVE
Influenza B by PCR: NEGATIVE
SARS Coronavirus 2 by RT PCR: POSITIVE — AB

## 2021-02-11 LAB — AMMONIA: Ammonia: 11 umol/L (ref 9–35)

## 2021-02-11 LAB — ETHANOL: Alcohol, Ethyl (B): <10 mg/dL (ref <10)

## 2021-02-11 MED ORDER — ACETAMINOPHEN 325 MG PO TABS
325.0000 mg | ORAL_TABLET | Freq: Four times a day (QID) | ORAL | Status: AC | PRN
  Administered 2021-02-12: 325 mg via ORAL
  Filled 2021-02-11: qty 1

## 2021-02-11 MED ORDER — FLUTICASONE-UMECLIDIN-VILANT 200-62.5-25 MCG/INH IN AEPB: 1.0000 | INHALATION_SPRAY | Freq: Every day | RESPIRATORY_TRACT | Status: DC

## 2021-02-11 MED ORDER — GABAPENTIN 300 MG PO CAPS
300.0000 mg | ORAL_CAPSULE | Freq: Three times a day (TID) | ORAL | Status: DC
  Administered 2021-02-11 – 2021-02-17 (×19): 300 mg via ORAL
  Filled 2021-02-11 (×5): qty 1
  Filled 2021-02-11: qty 3
  Filled 2021-02-11 (×14): qty 1

## 2021-02-11 MED ORDER — LACTATED RINGERS IV BOLUS
500.0000 mL | Freq: Once | INTRAVENOUS | Status: AC
  Administered 2021-02-11: 500 mL via INTRAVENOUS

## 2021-02-11 MED ORDER — GUAIFENESIN-DM 100-10 MG/5ML PO SYRP
10.0000 mL | ORAL_SOLUTION | ORAL | Status: DC | PRN
  Administered 2021-02-16 (×2): 10 mL via ORAL
  Filled 2021-02-11 (×2): qty 10

## 2021-02-11 MED ORDER — HYDROCOD POLST-CPM POLST ER 10-8 MG/5ML PO SUER: 5.0000 mL | Freq: Two times a day (BID) | ORAL | Status: DC | PRN

## 2021-02-11 MED ORDER — ONDANSETRON HCL 4 MG/2ML IJ SOLN: 4.0000 mg | Freq: Four times a day (QID) | INTRAMUSCULAR | Status: DC | PRN

## 2021-02-11 MED ORDER — ENOXAPARIN SODIUM 40 MG/0.4ML ~~LOC~~ SOLN
40.0000 mg | SUBCUTANEOUS | Status: DC
  Administered 2021-02-11 – 2021-02-12 (×2): 40 mg via SUBCUTANEOUS
  Filled 2021-02-11 (×2): qty 0.4

## 2021-02-11 MED ORDER — CARVEDILOL 6.25 MG PO TABS
6.2500 mg | ORAL_TABLET | Freq: Two times a day (BID) | ORAL | Status: DC
  Administered 2021-02-12 – 2021-02-17 (×12): 6.25 mg via ORAL
  Filled 2021-02-11 (×12): qty 1

## 2021-02-11 MED ORDER — ASPIRIN EC 81 MG PO TBEC
81.0000 mg | DELAYED_RELEASE_TABLET | Freq: Every day | ORAL | Status: DC
  Administered 2021-02-11 – 2021-02-17 (×7): 81 mg via ORAL
  Filled 2021-02-11 (×7): qty 1

## 2021-02-11 MED ORDER — ISOSORBIDE MONONITRATE ER 30 MG PO TB24
30.0000 mg | ORAL_TABLET | Freq: Every day | ORAL | Status: DC
  Administered 2021-02-12 – 2021-02-17 (×6): 30 mg via ORAL
  Filled 2021-02-11 (×4): qty 1

## 2021-02-11 MED ORDER — CALCIUM CARBONATE 1250 (500 CA) MG PO TABS
500.0000 mg | ORAL_TABLET | Freq: Two times a day (BID) | ORAL | Status: DC
  Administered 2021-02-11 – 2021-02-17 (×13): 500 mg via ORAL
  Filled 2021-02-11 (×14): qty 1

## 2021-02-11 MED ORDER — SODIUM CHLORIDE 0.9 % IV SOLN
1.0000 g | INTRAVENOUS | Status: DC
  Administered 2021-02-11: 1 g via INTRAVENOUS
  Filled 2021-02-11 (×2): qty 10

## 2021-02-11 MED ORDER — ADULT MULTIVITAMIN W/MINERALS CH
1.0000 | ORAL_TABLET | Freq: Every day | ORAL | Status: DC
  Administered 2021-02-12 – 2021-02-17 (×6): 1 via ORAL
  Filled 2021-02-11 (×6): qty 1

## 2021-02-11 MED ORDER — ENSURE ENLIVE PO LIQD
237.0000 mL | Freq: Three times a day (TID) | ORAL | Status: DC
  Administered 2021-02-13 – 2021-02-17 (×13): 237 mL via ORAL

## 2021-02-11 MED ORDER — UMECLIDINIUM BROMIDE 62.5 MCG/INH IN AEPB
1.0000 | INHALATION_SPRAY | Freq: Every day | RESPIRATORY_TRACT | Status: DC
  Administered 2021-02-12 – 2021-02-17 (×6): 1 via RESPIRATORY_TRACT
  Filled 2021-02-11: qty 7

## 2021-02-11 MED ORDER — HEPARIN SODIUM (PORCINE) 5000 UNIT/ML IJ SOLN: 5000.0000 [IU] | Freq: Three times a day (TID) | INTRAMUSCULAR | Status: DC

## 2021-02-11 MED ORDER — AMLODIPINE BESYLATE 5 MG PO TABS
5.0000 mg | ORAL_TABLET | Freq: Every day | ORAL | Status: DC
  Administered 2021-02-12 – 2021-02-17 (×6): 5 mg via ORAL
  Filled 2021-02-11 (×6): qty 1

## 2021-02-11 MED ORDER — NITROGLYCERIN 0.4 MG SL SUBL: 0.4000 mg | SUBLINGUAL_TABLET | SUBLINGUAL | Status: DC | PRN

## 2021-02-11 MED ORDER — ATORVASTATIN CALCIUM 20 MG PO TABS
40.0000 mg | ORAL_TABLET | Freq: Every day | ORAL | Status: DC
  Administered 2021-02-11 – 2021-02-16 (×6): 40 mg via ORAL
  Filled 2021-02-11 (×6): qty 2

## 2021-02-11 MED ORDER — MORPHINE SULFATE (PF) 2 MG/ML IV SOLN
1.0000 mg | INTRAVENOUS | Status: DC | PRN
  Administered 2021-02-11: 1 mg via INTRAVENOUS
  Filled 2021-02-11: qty 1

## 2021-02-11 MED ORDER — ONDANSETRON HCL 4 MG PO TABS: 4.0000 mg | ORAL_TABLET | Freq: Four times a day (QID) | ORAL | Status: DC | PRN

## 2021-02-11 MED ORDER — IPRATROPIUM BROMIDE 0.03 % NA SOLN
2.0000 | Freq: Every day | NASAL | Status: DC
  Administered 2021-02-12 – 2021-02-17 (×6): 2 via NASAL
  Filled 2021-02-11: qty 30

## 2021-02-11 MED ORDER — SODIUM CHLORIDE 0.9 % IV SOLN
200.0000 mg | Freq: Once | INTRAVENOUS | Status: AC
  Administered 2021-02-11: 200 mg via INTRAVENOUS
  Filled 2021-02-11: qty 200

## 2021-02-11 MED ORDER — FLUTICASONE FUROATE-VILANTEROL 200-25 MCG/INH IN AEPB
1.0000 | INHALATION_SPRAY | Freq: Every day | RESPIRATORY_TRACT | Status: DC
  Administered 2021-02-12 – 2021-02-17 (×6): 1 via RESPIRATORY_TRACT
  Filled 2021-02-11: qty 28

## 2021-02-11 MED ORDER — QUETIAPINE FUMARATE 25 MG PO TABS
50.0000 mg | ORAL_TABLET | Freq: Every day | ORAL | Status: DC
Start: 2021-02-11 — End: 2021-02-17
  Administered 2021-02-11 – 2021-02-16 (×6): 50 mg via ORAL
  Filled 2021-02-11 (×6): qty 2

## 2021-02-11 MED ORDER — SODIUM CHLORIDE 0.9 % IV SOLN
100.0000 mg | Freq: Every day | INTRAVENOUS | Status: DC
  Administered 2021-02-12 – 2021-02-13 (×2): 100 mg via INTRAVENOUS
  Filled 2021-02-11 (×3): qty 20
  Filled 2021-02-11: qty 100

## 2021-02-11 MED ORDER — ACETAMINOPHEN 500 MG PO TABS
1000.0000 mg | ORAL_TABLET | Freq: Once | ORAL | Status: AC
  Administered 2021-02-11: 1000 mg via ORAL
  Filled 2021-02-11: qty 2

## 2021-02-11 MED ORDER — CLOPIDOGREL BISULFATE 75 MG PO TABS
75.0000 mg | ORAL_TABLET | Freq: Every day | ORAL | Status: DC
  Administered 2021-02-11 – 2021-02-17 (×7): 75 mg via ORAL
  Filled 2021-02-11 (×7): qty 1

## 2021-02-11 MED ORDER — FLUTICASONE PROPIONATE 50 MCG/ACT NA SUSP
2.0000 | Freq: Every day | NASAL | Status: DC
  Administered 2021-02-12 – 2021-02-17 (×6): 2 via NASAL
  Filled 2021-02-11: qty 16

## 2021-02-11 MED ORDER — ALBUTEROL SULFATE HFA 108 (90 BASE) MCG/ACT IN AERS
2.0000 | INHALATION_SPRAY | Freq: Four times a day (QID) | RESPIRATORY_TRACT | Status: DC
  Administered 2021-02-11 – 2021-02-17 (×20): 2 via RESPIRATORY_TRACT
  Filled 2021-02-11: qty 6.7

## 2021-02-11 NOTE — ED Notes (Signed)
Per Venetia Maxon., RN, pt to be going to different unit so no longer has bed assignment

## 2021-02-11 NOTE — ED Notes (Signed)
Answered call bell. Pt stating need to use urinal. Urinal set up and pt was unable to urinate at this time. Will continue to monitor.

## 2021-02-11 NOTE — ED Notes (Signed)
Informed [place] RN of bed assignment

## 2021-02-11 NOTE — H&P (Addendum)
History and Physical   TAUNO FALOTICO WSF:[id number] DOB: 01/24/1935 DOA: 02/11/2021  PCP: Marguerita Merles, MD  Outpatient Specialists: Dr. Fletcher Anon, Cardiology Patient coming from: home via EMS  I have personally briefly reviewed patient's old medical records in [place].  Chief Concern: facial droop  HPI: [name] is a 86 y.o. male with medical history significant for CAD status post DES in the right coronary artery in 2005, hypertension, non-insulin-dependent diabetes mellitus, hyperlipidemia, chronic diastolic/systolic heart failure, sleep apnea not on CPAP, morbid obesity, previous tobacco abuse, previous history of alcohol abuse, CKD4, bipolar disorder with behavioral disturbances/agitation, peripheral arterial disease, mild to moderate aortic stenosis, presented from home via EMS for chief concerns of facial drooping.  He denies facial drooping, slurring of speech. He endorses burning with urination for one day. He denies nausea and vomtiing.  He states he is not having any chest pain or shortness of breath that he feels as well as he can feel and wants to go home.  At bedside, patient refused to participate in physical exam. He yelled at him and cursed at me stating that the bed is the worst bed in the world and he wants to leave the hospital.  He was able to follow commands with extensive redirection.  Social history: lives by himself and a healthcare aid comes in to see him daily  Vaccination: two doses, no booster  ROS: Unable to complete due to patient's agitation  ED Course: Discussed with ED provider, patient requiring hospitalization due to COVID-19 infection with.  Patient voiced to EDP that he wanted to leave the hospital, however patient's guardian requested patient be admitted to the hospital.  Vitals in the emergency room was reassuring with temperatures 98.4, respiration of 18, heart rate 79, blood pressure 122/97, satting at 99% on room air.  Labs were  remarkable serum for serum creatinine of 2.28, which is baseline for patient a.m. level 127, serum potassium 4, chloride 94, bicarb 23, BUN 35.  No WBC elevation, hemoglobin 10.1, hematocrit 30, platelets 240.  Patient tested positive for COVID-19.  CT of the head was read as negative for acute intracranial abnormality.  Stable 3.9 cm meningioma arising from the left upper tentorium.  Assessment/Plan  Principal Problem:   COVID-19 Active Problems:   Coronary artery disease   Essential hypertension   COPD (chronic obstructive pulmonary disease) with chronic bronchitis (HCC)   Obstructive sleep apnea   Alcohol abuse   Chronic constipation   CKD (chronic kidney disease), stage IV (HCC)   History of alcohol abuse   Class 3 obesity with alveolar hypoventilation and body mass index (BMI) of 40.0 to 44.9 in adult [facility])   Weakness   Acute generalized weakness secondary to COVID-19 infection - IV remdesivir per pharmacy - Incentive spirometry and flutter valve for 10 reps every 2 hours while awake - Albuterol inhaler 2 puffs every 4 hours while awake - Daily labs: CMP, CBC, CRP - Supplemental oxygen to maintain SPO2 goal of greater than 88% - Airborne and contact precautions  Enterococcus faecalis UTI -ceftriaxone 1 g IV daily -Resistant to ampicillin, susceptible to nitrofurantoin and vancomycin -Collected on 01/28/2020` Hypertension-amlodipine 5 mg daily, Coreg 6.25 mg twice daily, isosorbide mononitrate 30 mg daily  No facial droop was appreciated on physical exam, CT of the head was negative, we will admit to medical surgery with telemetry and monitor -If neuro status changes, we will order MRI as appropriate  Insomnia-quetiapine 50 mg daily at bedtime  Hyperlipidemia-atorvastatin 40 mg daily  CAD-clopidogrel 75 mg daily, aspirin 81 mg daily, Coreg, atorvastatin,  Neuropathy-gabapentin 300 mg twice daily  COPD-trilogy Ellipta resumed,  OSA-CPAP, questionable  compliance -CPAP nightly ordered  Chart reviewed.   DVT prophylaxis: Enoxaparin 30 mg subcutaneous Code Status: Full code Diet: Heart healthy Family Communication: attempted to call Ms. [name] McCain at [phone] twice and no answer and mailbox was full. She is patient's legal guardian Disposition Plan: Pending clinical course Consults called: No Admission status: Observation MedSurg with telemetry  Past Medical History:  Diagnosis Date  . Alcoholism ([place])   . Asthma   . Bipolar affective disorder ([place])   . Chronic combined systolic and diastolic CHF (congestive heart failure) ([place])    a. 08/2016 Echo: EF 40-45%, mild AS, mild to mod MR, mildly dil LA/RA, mild-mod TR; b. 07/2017 Echo: EF 40-45%, mod LVH, Gr1 DD, mild to mod AS, mildly dil LA, nl RV fxn; c. 03/2019 Echo: EF 45-50%, AS (not severe). Mod dil PA.  . CKD (chronic kidney disease), stage III ([place])   . COPD (chronic obstructive pulmonary disease) ([place])   . Coronary artery disease    a. 01/2004 s/p PCI and Taxus DES to dRCA (3.5 x 12 mm); b. 07/2017 Lexiscan MV: no ischemia. Sm area of apicl thinning, likely attenuation. EF 33% (GI uptake noted)-->Low risk; c. 03/2019 Inf STEMI/PCI: LM nl, LAD min irregs, D1 20ost, RI 20ost, LCX nl, RCA 90p/28m (4.0x26 Resolute Onyx DES), 54m (4.0x15 Resolute Onyx DES), 10d ISR.  Marland Kitchen Degenerative joint disease    knees and hip  . Dyspnea    on exertion  . Essential hypertension   . Hyperlipidemia   . Hypertension    controlled on meds  . Ischemic cardiomyopathy    a. 08/2016 Echo: EF 40-45%; b. 07/2017 Echo: EF 40-45%; c. 03/2019 Echo: EF 45-50%.  . OSA (obstructive sleep apnea)   . Prostate CA [facility])    prostate ca dx 20 yrs ago  . PVD (peripheral vascular disease) ([place])   . Tobacco abuse    Past Surgical History:  Procedure Laterality Date  . CARDIAC CATHETERIZATION    . CORONARY ANGIOGRAPHY N/A 04/01/2019   Procedure: CORONARY ANGIOGRAPHY;  Surgeon: Nelva Bush, MD;  Location: [place] [facility];  Service: Cardiovascular;  Laterality: N/A;  . CORONARY ANGIOPLASTY WITH STENT PLACEMENT     x2  . CORONARY/GRAFT ACUTE MI REVASCULARIZATION N/A 04/01/2019   Procedure: Coronary/Graft Acute MI Revascularization;  Surgeon: Nelva Bush, MD;  Location: [place] [facility];  Service: Cardiovascular;  Laterality: N/A;  . TOTAL HIP ARTHROPLASTY     right   Social History:  reports that he has quit smoking. His smoking use included cigarettes. He has a 50.00 pack-year smoking history. He has never used smokeless tobacco. He reports previous alcohol use. He reports that he does not use drugs.  Allergies  Allergen Reactions  . Chlorthalidone Other (See Comments)    Hyponatremia  . Ace Inhibitors Cough  . Hydrochlorothiazide   . Lisinopril Other (See Comments)  . Losartan   . Other Other (See Comments)    "ANY BLOOD PRESSURE MEDICATIONS THAT I'VE TRIED" - PT. DOES NOT REMEMBER WHICH ONES "ANY BLOOD PRESSURE MEDICATIONS THAT I'VE TRIED"- PT. DOES NOT REMEMBER WHICH ONES- CONSTIPATION  . Benazepril-Hydrochlorothiazide Other (See Comments)    Constipation   Family History  Problem Relation Age of Onset  . Hypertension Mother   . Hyperlipidemia Mother   . Heart attack Mother   .  Hypertension Father   . Hyperlipidemia Father   . Heart attack Father   . Prostate cancer Neg Hx   . Bladder Cancer Neg Hx   . Kidney cancer Neg Hx    Family history: Family history reviewed and not pertinent  Prior to Admission medications   Medication Sig Start Date End Date Taking? Authorizing Provider  ADULT ASPIRIN REGIMEN 81 MG EC tablet TAKE 1 TABLET BY MOUTH DAILY Patient taking differently: Take 81 mg by mouth daily. 03/08/20   [name], MD  albuterol (VENTOLIN HFA) 108 (90 Base) MCG/ACT inhaler USE 2 PUFFS EVERY 4 HOURS AS NEEDED Patient taking differently: Inhale 2 puffs into the lungs every 4 (four) hours as needed for shortness of breath. 10/14/19   Alisa Graff, FNP   amLODipine (NORVASC) 5 MG tablet Take 1 tablet (5 mg total) by mouth daily. 01/09/20   Alisa Graff, FNP  atorvastatin (LIPITOR) 80 MG tablet Take 0.5 tablets (40 mg total) by mouth daily. 07/19/20   [name], MD  azelastine (ASTELIN) 0.1 % nasal spray Place 1 spray into both nostrils daily as needed for allergies.    [provider]  calcium carbonate (OSCAL) 1500 (600 Ca) MG TABS tablet Take 1 tablet (1,500 mg total) by mouth 2 (two) times daily with a meal. 11/29/20 02/27/21  Milinda Pointer, MD  carvedilol (COREG) 6.25 MG tablet TAKE 1 TABLET BY MOUTH TWICE A DAY Patient taking differently: Take 6.25 mg by mouth 2 (two) times daily with a meal. 12/08/20   [name], MD  clopidogrel (PLAVIX) 75 MG tablet Take 1 tablet (75 mg total) by mouth daily. 10/28/19   [name], MD  feeding supplement (ENSURE ENLIVE / ENSURE PLUS) LIQD Take 237 mLs by mouth 3 (three) times daily between meals. 01/13/21   Lorella Nimrod, MD  fluticasone (FLONASE) 50 MCG/ACT nasal spray Place 2 sprays into both nostrils daily.    [provider]  furosemide (LASIX) 40 MG tablet TAKE 1 TABLET BY MOUTH DAILY AND TAKE ANADDITIONAL TABLET IN THE AFTERNOON AS NEEDED FOR WEIGHT GAIN AND LEG EDEMA Patient taking differently: Take 40 mg by mouth daily. May take an additional If needed for weight gain/edema 12/21/20   [name], MD  gabapentin (NEURONTIN) 300 MG capsule Take 1 capsule (300 mg total) by mouth 2 (two) times daily for 7 days, THEN 1 capsule (300 mg total) 3 (three) times daily for 7 days, THEN 1 capsule (300 mg total) 4 (four) times daily for 15 days. Follow written titration schedule.. 11/29/20 12/28/20  Milinda Pointer, MD  HYDROcodone-acetaminophen (NORCO/VICODIN) 5-325 MG tablet Take 1-2 tablets by mouth every 6 (six) hours as needed for moderate pain. Patient taking differently: Take 1 tablet by mouth every 12 (twelve) hours. 01/13/21   Lorella Nimrod, MD  ipratropium  (ATROVENT) 0.03 % nasal spray Place 2 sprays into both nostrils daily. 05/07/20   [provider]  isosorbide mononitrate (IMDUR) 30 MG 24 hr tablet TAKE 1 TABLET BY MOUTH DAILY Patient taking differently: Take 30 mg by mouth daily. 12/30/19   [name], MD  Magnesium Oxide 500 MG CAPS Take 1 capsule (500 mg total) by mouth daily. 11/29/20 02/27/21  Milinda Pointer, MD  Multiple Vitamin (MULTIVITAMIN WITH MINERALS) TABS tablet Take 1 tablet by mouth daily. 01/13/21   Lorella Nimrod, MD  naproxen (NAPROSYN) 500 MG tablet Take 500 mg by mouth daily.    [provider]  nitroGLYCERIN (NITROSTAT) 0.4  MG SL tablet Take 0.4 mg by mouth every 5 (five) minutes x 3 doses as needed for chest pain. As needed for chest pain 12/06/15   [provider]  QUEtiapine (SEROQUEL) 50 MG tablet Take 50 mg by mouth at bedtime.    [provider]  Donnal Debar 200-62.5-25 MCG/INH AEPB Inhale 1 puff into the lungs daily.  04/08/20   [provider]  Cholecalciferol (VITAMIN D3) 125 MCG (5000 UT) CAPS Take 1 capsule (5,000 Units total) by mouth daily with breakfast. Take along with calcium and magnesium. Patient not taking: Reported on 01/27/2021 11/29/20 01/28/21  Milinda Pointer, MD   Physical Exam: Vitals:   02/11/21 1030 02/11/21 1100 02/11/21 1230 02/11/21 1339  BP: 108/79 (!) 94/57 (!) 122/97 (!) 122/97  Pulse: (!) 49 (!) 56 76 79  Resp:    18  Temp:    98.4 F (36.9 C)  TempSrc:    Oral  SpO2: 97% 98% 98% 99%  Weight:      Height:       Constitutional: appears age-appropriate, NAD, calm, comfortable Eyes: PERRL, lids and conjunctivae normal ENMT: Mucous membranes are moist. Posterior pharynx clear of any exudate or lesions. Age-appropriate dentition. Hearing appropriate Neck: normal, supple, no masses, no thyromegaly Respiratory: clear to auscultation bilaterally, no wheezing, no crackles. Normal respiratory effort. No accessory muscle use.   Cardiovascular: Regular rate and rhythm, no murmurs / rubs / gallops. No extremity edema. 2+ pedal pulses. No carotid bruits.  Abdomen: Obese abdomen, no tenderness, no masses palpated, no hepatosplenomegaly. Bowel sounds positive.  Musculoskeletal: no clubbing / cyanosis. No joint deformity upper and lower extremities. Good ROM, no contractures, no atrophy. Normal muscle tone.  Skin: no rashes, lesions, ulcers. No induration Neurologic: Sensation intact. Strength 5/5 in all 4.  Psychiatric: Normal judgment and insight. Alert and oriented x 3. Normal mood.   EKG: independently reviewed, showing sinus rhythm with rate of 65, QTc 479  Chest x-ray on Admission: I personally reviewed and I agree with radiologist reading as below.  DG Chest 1 View  Result Date: 02/11/2021 CLINICAL DATA:  Weakness, COVID-19. EXAM: CHEST  1 VIEW COMPARISON:  January 08, 2021. FINDINGS: Stable cardiomegaly. Lungs are clear. No pneumothorax or pleural effusion is noted. Bony thorax is unremarkable. IMPRESSION: No acute cardiopulmonary abnormality seen. Aortic Atherosclerosis (ICD10-I70.0). Electronically Signed   By: Marijo Conception M.D.   On: 02/11/2021 13:22   CT Head Wo Contrast  Result Date: 02/11/2021 CLINICAL DATA:  Right facial droop EXAM: CT HEAD WITHOUT CONTRAST TECHNIQUE: Contiguous axial images were obtained from the base of the skull through the vertex without intravenous contrast. COMPARISON:  01/27/2021 FINDINGS: Brain: No evidence of acute infarction, hemorrhage, hydrocephalus, or extra-axial collection. 3.8 x 3.9 cm meningioma arising from the left upper tentorium (series 3/image 17), with mild mass effect on the left temporo-occipital region, unchanged. Vascular: Mild subcortical white matter and periventricular small vessel ischemic changes. Skull: Normal. Negative for fracture or focal lesion. Sinuses/Orbits: Near complete opacification of the right maxillary sinus. Partial opacification of the right  frontal and sphenoid sinuses. Mastoid air cells are clear. Other: None. IMPRESSION: No evidence of acute intracranial abnormality. Stable 3.9 cm meningioma arising from the left upper tentorium. Electronically Signed   By: Julian Hy M.D.   On: 02/11/2021 10:24   DG Pelvis Portable  Result Date: 02/11/2021 CLINICAL DATA:  Weakness. EXAM: PORTABLE PELVIS 1-2 VIEWS COMPARISON:  September 04, 2012. FINDINGS: There is no evidence of acute pelvic  fracture or diastasis. No pelvic bone lesions are seen. Status post right total hip arthroplasty. Status post surgical internal fixation of old proximal left femoral fracture. There appears to be chronic resorption of the left femoral head with degenerative joint disease of the left hip. IMPRESSION: No acute abnormality seen in the pelvis. Electronically Signed   By: Marijo Conception M.D.   On: 02/11/2021 13:25   Labs on Admission: I have personally reviewed following labs  CBC: Recent Labs  Lab 02/11/21 0946  WBC 8.1  NEUTROABS 5.7  HGB 10.1*  HCT 30.0*  MCV 83.3  PLT 397   Basic Metabolic Panel: Recent Labs  Lab 02/11/21 0946  NA 127*  K 4.0  CL 94*  CO2 23  GLUCOSE 116*  BUN 35*  CREATININE 2.28*  CALCIUM 8.3*   GFR: Estimated Creatinine Clearance: 30.7 mL/min (A) (by C-G formula based on SCr of 2.28 mg/dL (H)). Liver Function Tests: Recent Labs  Lab 02/11/21 0946  AST 33  ALT 55*  ALKPHOS 111  BILITOT 1.1  PROT 6.9  ALBUMIN 2.9*   Recent Labs  Lab 02/11/21 0946  AMMONIA 11   Thyroid Function Tests: Recent Labs    02/11/21 0946  TSH 1.388   Urine analysis:    Component Value Date/Time   COLORURINE YELLOW 01/28/2021 0100   APPEARANCEUR CLOUDY (A) 01/28/2021 0100   APPEARANCEUR Clear 06/14/2018 1341   LABSPEC 1.014 01/28/2021 0100   LABSPEC 1.004 04/03/2015 1216   PHURINE 5.0 01/28/2021 0100   GLUCOSEU NEGATIVE 01/28/2021 0100   GLUCOSEU Negative 04/03/2015 1216   HGBUR SMALL (A) 01/28/2021 0100    BILIRUBINUR NEGATIVE 01/28/2021 0100   BILIRUBINUR Negative 06/14/2018 1341   BILIRUBINUR Negative 04/03/2015 1216   KETONESUR 5 (A) 01/28/2021 0100   PROTEINUR 100 (A) 01/28/2021 0100   UROBILINOGEN 0.2 03/27/2011 0845   NITRITE NEGATIVE 01/28/2021 0100   LEUKOCYTESUR MODERATE (A) 01/28/2021 0100   LEUKOCYTESUR Negative 04/03/2015 1216   Mitali Shenefield N Jaymere Alen D.O. [facility]  If 7PM-7AM, please contact overnight-coverage provider If 7AM-7PM, please contact day coverage provider [email or web address]  02/11/2021, 3:44 PM

## 2021-02-11 NOTE — ED Notes (Signed)
Pt moved to hospital bed for comfort.

## 2021-02-11 NOTE — ED Provider Notes (Signed)
[facility] Emergency Department Provider Note  ____________________________________________   Event Date/Time   First MD Initiated Contact with Patient 02/11/21 0935     (approximate)  I have reviewed the triage vital signs and the nursing notes.   HISTORY  Chief Complaint Facial Droop (Right sided facial droop)   HPI [name] SEE is a 86 y.o. male with a past medical history of blindness in left eye, COPD, bipolar disorder, PVD, HTN, systolic HF (most recent EF 40-45%), CAD s/p stent placement and prostate CA (remote), chronic hyponatremia, and recent ED visit on 2/10 where patient was evaluated after he became very aggressive with nursing facility he was staying at for rehab from recent hip fracture discharged home on 2/13 who presents via EMS from home for assessment of some possible right-sided facial droop and difficulty swallowing the patient's daughter reportedly noticed yesterday.  Unclear when the symptoms were first noticed yesterday or when patient was last known well.  Per EMS patient also has reportedly not been able to walk since coming home due to some generalized weakness.  Patient states "I hurt everywhere".  He denies any focal chest pain, abdominal pain, back pain, headache area, sore throat, vomiting or urinary symptoms.  States he had some diarrhea for several weeks but over the last couple days this has resolved.  He denies any recent injuries or falls but refuses to elaborate on any details regarding his fall and hip fracture before today.           Past Medical History:  Diagnosis Date  . Alcoholism ([place])   . Asthma   . Bipolar affective disorder ([place])   . Chronic combined systolic and diastolic CHF (congestive heart failure) ([place])    a. 08/2016 Echo: EF 40-45%, mild AS, mild to mod MR, mildly dil LA/RA, mild-mod TR; b. 07/2017 Echo: EF 40-45%, mod LVH, Gr1 DD, mild to mod AS, mildly dil LA, nl RV fxn; c. 03/2019 Echo: EF 45-50%, AS  (not severe). Mod dil PA.  . CKD (chronic kidney disease), stage III ([place])   . COPD (chronic obstructive pulmonary disease) ([place])   . Coronary artery disease    a. 01/2004 s/p PCI and Taxus DES to dRCA (3.5 x 12 mm); b. 07/2017 Lexiscan MV: no ischemia. Sm area of apicl thinning, likely attenuation. EF 33% (GI uptake noted)-->Low risk; c. 03/2019 Inf STEMI/PCI: LM nl, LAD min irregs, D1 20ost, RI 20ost, LCX nl, RCA 90p/56m (4.0x26 Resolute Onyx DES), 96m (4.0x15 Resolute Onyx DES), 10d ISR.  Marland Kitchen Degenerative joint disease    knees and hip  . Dyspnea    on exertion  . Essential hypertension   . Hyperlipidemia   . Hypertension    controlled on meds  . Ischemic cardiomyopathy    a. 08/2016 Echo: EF 40-45%; b. 07/2017 Echo: EF 40-45%; c. 03/2019 Echo: EF 45-50%.  . OSA (obstructive sleep apnea)   . Prostate CA [facility])    prostate ca dx 20 yrs ago  . PVD (peripheral vascular disease) ([place])   . Tobacco abuse     Patient Active Problem List   Diagnosis Date Noted  . Weakness 02/11/2021  . Morbid obesity ([place]) 01/10/2021  . Right calcaneal fracture 01/10/2021  . PVD (peripheral vascular disease) ([place]) 01/10/2021  . Chronic systolic CHF (congestive heart failure) ([name]) 01/10/2021  . Hip fracture ([place]) 01/08/2021  . Primary osteoarthritis of left knee 01/06/2021  . Arthropathy of left knee 01/06/2021  . Chronic pain of left  knee 01/06/2021  . Class 3 obesity with alveolar hypoventilation and body mass index (BMI) of 40.0 to 44.9 in adult ([place]) 12/23/2020  . Hip joint effusion (Left) 12/23/2020  . History of alcohol abuse 11/29/2020  . Elevated rheumatoid factor 11/29/2020  . Polyarthritis with positive rheumatoid factor ([place]) 11/17/2020  . Elevated uric acid in blood 11/17/2020  . CKD (chronic kidney disease), stage IV ([place]) 10/26/2020  . Proteinuria, unspecified 10/26/2020  . Elevated BUN 10/26/2020  . Elevated serum creatinine 10/26/2020  . Hypochloremia 10/26/2020  . Hypocalcemia  10/26/2020  . Hypoalbuminemia 10/26/2020  . Elevated alkaline phosphatase level 10/26/2020  . Elevated hemoglobin A1c 10/26/2020  . Elevated random blood glucose level 10/26/2020  . Anemia in stage 3b chronic kidney disease ([place]) 10/26/2020  .  Klippel-Feil deformity at the C5-C7 levels 10/26/2020  . Carotid artery calcification, bilateral 10/26/2020  . History of 2.0 cm thyroid mass (Left lobe) 10/26/2020    Class: History of  . Bilateral renal cysts 10/26/2020  . Lumbosacral spondylosis 10/26/2020  . Osteoarthritis of facet joint of lumbar spine 10/26/2020  . DDD (degenerative disc disease), lumbosacral 10/26/2020  . History of total hip replacement (Right) 10/26/2020  . Osteoarthritis of hip (Left) 10/26/2020  . Diverticulosis of sigmoid colon 10/26/2020  . Abnormal MRI, lumbar spine (10/07/2020) 10/26/2020  . Lumbar central spinal stenosis with neurogenic claudication 10/26/2020  . Lumbar foraminal stenosis 10/26/2020  . Elevated C-reactive protein (CRP) 10/25/2020  . Elevated sed rate 10/25/2020  . Vitamin D deficiency 10/25/2020  . Chronic anticoagulation (Plavix) 09/20/2020  . Left hip pain 09/20/2020  . Unilateral post-traumatic osteoarthritis of hip (Left) 09/20/2020  . Chronic knee pain (2ry area of Pain) (Bilateral) 09/20/2020  . Secondary osteoarthritis of knee (Bilateral) 09/20/2020  . Osteoarthritis of knees (Bilateral) 09/20/2020  . Chronic constipation 09/20/2020  . Chronic shoulder pain (3ry area of Pain) (Bilateral) 09/20/2020  . Grade 1 (3 mm) anterolisthesis of lumbar spine (L4-5) (stable) 09/20/2020  . Chronic groin pain (Left) 09/20/2020  . Other intervertebral disc degeneration, lumbar region 09/20/2020  . Neurogenic pain 09/20/2020  . Chronic pain syndrome 09/18/2020  . Pharmacologic therapy 09/18/2020  . Disorder of skeletal system 09/18/2020  . Problems influencing health status 09/18/2020  . Lower extremity pain, bilateral 05/15/2018  . Elevated  troponin I level 07/31/2017  . Hyperkalemia 05/28/2017  . Syncope 05/27/2017  . Meningioma ([place]) 01/05/2017  . GI bleed 12/01/2016  . Alcohol abuse 10/30/2016  . COPD (chronic obstructive pulmonary disease) with chronic bronchitis ([place]) 10/04/2016  . Obstructive sleep apnea 10/04/2016  . Hyponatremia 08/31/2016  . Shortness of breath 02/11/2016  . Hyperlipidemia 12/03/2015  . Essential hypertension   . Coronary artery disease 01/19/2004    Past Surgical History:  Procedure Laterality Date  . CARDIAC CATHETERIZATION    . CORONARY ANGIOGRAPHY N/A 04/01/2019   Procedure: CORONARY ANGIOGRAPHY;  Surgeon: Nelva Bush, MD;  Location: [facility];  Service: Cardiovascular;  Laterality: N/A;  . CORONARY ANGIOPLASTY WITH STENT PLACEMENT     x2  . CORONARY/GRAFT ACUTE MI REVASCULARIZATION N/A 04/01/2019   Procedure: Coronary/Graft Acute MI Revascularization;  Surgeon: Nelva Bush, MD;  Location: [place] [facility];  Service: Cardiovascular;  Laterality: N/A;  . TOTAL HIP ARTHROPLASTY     right    Prior to Admission medications   Medication Sig Start Date End Date Taking? Authorizing Provider  ADULT ASPIRIN REGIMEN 81 MG EC tablet TAKE 1 TABLET BY MOUTH DAILY Patient taking differently: Take 81 mg by  mouth daily. 03/08/20   [name], MD  albuterol (VENTOLIN HFA) 108 (90 Base) MCG/ACT inhaler USE 2 PUFFS EVERY 4 HOURS AS NEEDED Patient taking differently: Inhale 2 puffs into the lungs every 4 (four) hours as needed for shortness of breath. 10/14/19   Alisa Graff, FNP  amLODipine (NORVASC) 5 MG tablet Take 1 tablet (5 mg total) by mouth daily. 01/09/20   Alisa Graff, FNP  atorvastatin (LIPITOR) 80 MG tablet Take 0.5 tablets (40 mg total) by mouth daily. 07/19/20   [name], MD  azelastine (ASTELIN) 0.1 % nasal spray Place 1 spray into both nostrils daily as needed for allergies.    [provider]  calcium carbonate (OSCAL) 1500 (600 Ca) MG  TABS tablet Take 1 tablet (1,500 mg total) by mouth 2 (two) times daily with a meal. 11/29/20 02/27/21  Milinda Pointer, MD  carvedilol (COREG) 6.25 MG tablet TAKE 1 TABLET BY MOUTH TWICE A DAY Patient taking differently: Take 6.25 mg by mouth 2 (two) times daily with a meal. 12/08/20   [name], MD  clopidogrel (PLAVIX) 75 MG tablet Take 1 tablet (75 mg total) by mouth daily. 10/28/19   [name], MD  feeding supplement (ENSURE ENLIVE / ENSURE PLUS) LIQD Take 237 mLs by mouth 3 (three) times daily between meals. 01/13/21   Lorella Nimrod, MD  fluticasone (FLONASE) 50 MCG/ACT nasal spray Place 2 sprays into both nostrils daily.    [provider]  furosemide (LASIX) 40 MG tablet TAKE 1 TABLET BY MOUTH DAILY AND TAKE ANADDITIONAL TABLET IN THE AFTERNOON AS NEEDED FOR WEIGHT GAIN AND LEG EDEMA Patient taking differently: Take 40 mg by mouth daily. May take an additional If needed for weight gain/edema 12/21/20   [name], MD  gabapentin (NEURONTIN) 300 MG capsule Take 1 capsule (300 mg total) by mouth 2 (two) times daily for 7 days, THEN 1 capsule (300 mg total) 3 (three) times daily for 7 days, THEN 1 capsule (300 mg total) 4 (four) times daily for 15 days. Follow written titration schedule.. 11/29/20 12/28/20  Milinda Pointer, MD  HYDROcodone-acetaminophen (NORCO/VICODIN) 5-325 MG tablet Take 1-2 tablets by mouth every 6 (six) hours as needed for moderate pain. Patient taking differently: Take 1 tablet by mouth every 12 (twelve) hours. 01/13/21   Lorella Nimrod, MD  ipratropium (ATROVENT) 0.03 % nasal spray Place 2 sprays into both nostrils daily. 05/07/20   [provider]  isosorbide mononitrate (IMDUR) 30 MG 24 hr tablet TAKE 1 TABLET BY MOUTH DAILY Patient taking differently: Take 30 mg by mouth daily. 12/30/19   [name], MD  Magnesium Oxide 500 MG CAPS Take 1 capsule (500 mg total) by mouth daily. 11/29/20 02/27/21  Milinda Pointer, MD  Multiple  Vitamin (MULTIVITAMIN WITH MINERALS) TABS tablet Take 1 tablet by mouth daily. 01/13/21   Lorella Nimrod, MD  naproxen (NAPROSYN) 500 MG tablet Take 500 mg by mouth daily.    [provider]  nitroGLYCERIN (NITROSTAT) 0.4 MG SL tablet Take 0.4 mg by mouth every 5 (five) minutes x 3 doses as needed for chest pain. As needed for chest pain 12/06/15   [provider]  QUEtiapine (SEROQUEL) 50 MG tablet Take 50 mg by mouth at bedtime.    [provider]  Donnal Debar 200-62.5-25 MCG/INH AEPB Inhale 1 puff into the lungs daily.  04/08/20   [provider]  Cholecalciferol (VITAMIN D3) 125 MCG (5000 UT) CAPS Take 1 capsule (  5,000 Units total) by mouth daily with breakfast. Take along with calcium and magnesium. Patient not taking: Reported on 01/27/2021 11/29/20 01/28/21  Milinda Pointer, MD    Allergies Chlorthalidone, Ace inhibitors, Hydrochlorothiazide, Lisinopril, Losartan, Other, and Benazepril-hydrochlorothiazide  Family History  Problem Relation Age of Onset  . Hypertension Mother   . Hyperlipidemia Mother   . Heart attack Mother   . Hypertension Father   . Hyperlipidemia Father   . Heart attack Father   . Prostate cancer Neg Hx   . Bladder Cancer Neg Hx   . Kidney cancer Neg Hx     Social History Social History   Tobacco Use  . Smoking status: Former Smoker    Packs/day: 1.00    Years: 50.00    Pack years: 50.00    Types: Cigarettes  . Smokeless tobacco: Never Used  Vaping Use  . Vaping Use: Never used  Substance Use Topics  . Alcohol use: Not Currently  . Drug use: No    Review of Systems  Review of Systems  Constitutional: Negative for chills and fever.  HENT: Negative for sore throat.   Eyes: Negative for pain.  Respiratory: Negative for cough and stridor.   Cardiovascular: Negative for chest pain.  Gastrointestinal: Positive for diarrhea ( "for several weeks but has resovled over the last couple of days). Negative for  vomiting.  Genitourinary: Negative for dysuria.  Musculoskeletal: Positive for myalgias ( "everything hurts").  Skin: Negative for rash.  Neurological: Positive for weakness ( "everywhere but can't walk for 2-3 weeks"). Negative for seizures, loss of consciousness and headaches.  Psychiatric/Behavioral: Negative for suicidal ideas.  All other systems reviewed and are negative.     ____________________________________________   PHYSICAL EXAM:  VITAL SIGNS: ED Triage Vitals  Enc Vitals Group     BP      Pulse      Resp      Temp      Temp src      SpO2      Weight      Height      Head Circumference      Peak Flow      Pain Score      Pain Loc      Pain Edu?      Excl. in [employer]?    Vitals:   02/11/21 1230 02/11/21 1339  BP: (!) 122/97 (!) 122/97  Pulse: 76 79  Resp:  18  Temp:  98.4 F (36.9 C)  SpO2: 98% 99%   Physical Exam Vitals and nursing note reviewed.  Constitutional:      Appearance: He is well-developed and well-nourished.  HENT:     Head: Normocephalic and atraumatic.     Right Ear: External ear normal.     Left Ear: External ear normal.     Nose: Nose normal.  Eyes:     Conjunctiva/sclera: Conjunctivae normal.  Cardiovascular:     Rate and Rhythm: Normal rate and regular rhythm.     Heart sounds: No murmur heard.   Pulmonary:     Effort: Pulmonary effort is normal. No respiratory distress.     Breath sounds: Normal breath sounds.  Abdominal:     Palpations: Abdomen is soft.     Tenderness: There is no abdominal tenderness.  Musculoskeletal:        General: No edema.     Cervical back: Neck supple.  Skin:    General: Skin is warm and dry.  Capillary Refill: Capillary refill takes less than 2 seconds.  Neurological:     Mental Status: He is alert and oriented to person, place, and time.  Psychiatric:        Mood and Affect: Mood and affect and mood normal.     Patient has symmetric strength in his bilateral upper and lower  extremities.  Sensation is intact to light touch of all extremities.  The exception of the left eye which has some weakness on abduction no other clear cranial nerve deficits.  Patient's right lower lip is held slightly differently than the left but on smiling there is no clear evident facial droop.  No slurred speech.  Patient is oriented x3. ____________________________________________   LABS (all labs ordered are listed, but only abnormal results are displayed)  Labs Reviewed  RESP PANEL BY RT-PCR (FLU A&B, COVID) ARPGX2 - Abnormal; Notable for the following components:      Result Value   SARS Coronavirus 2 by RT PCR POSITIVE (*)    All other components within normal limits  CBC WITH DIFFERENTIAL/PLATELET - Abnormal; Notable for the following components:   RBC 3.60 (*)    Hemoglobin 10.1 (*)    HCT 30.0 (*)    Abs Immature Granulocytes 0.14 (*)    All other components within normal limits  COMPREHENSIVE METABOLIC PANEL - Abnormal; Notable for the following components:   Sodium 127 (*)    Chloride 94 (*)    Glucose, Bld 116 (*)    BUN 35 (*)    Creatinine, Ser 2.28 (*)    Calcium 8.3 (*)    Albumin 2.9 (*)    ALT 55 (*)    GFR, Estimated 28 (*)    All other components within normal limits  CULTURE, BLOOD (ROUTINE X 2)  CULTURE, BLOOD (ROUTINE X 2)  AMMONIA  TSH  ETHANOL  URINALYSIS, COMPLETE (UACMP) WITH MICROSCOPIC  PROCALCITONIN   ____________________________________________  EKG  Sinus rhythm with a ventricular rate of 65, normal axis, unremarkable intervals and no clear evidence of acute ischemia or other significant underlying arrhythmia. ____________________________________________  RADIOLOGY  ED MD interpretation: Chest x-ray is unremarkable for consolidation, large effusion, significant edema or other clear acute intrathoracic process.  Pelvic x-ray shows no clear fracture dislocation.  CT head shows no evidence of subacute CVA, intracranial hemorrhage or other  clear acute intracranial process.  There is a stable meningioma.  Official radiology report(s): DG Chest 1 View  Result Date: 02/11/2021 CLINICAL DATA:  Weakness, COVID-19. EXAM: CHEST  1 VIEW COMPARISON:  January 08, 2021. FINDINGS: Stable cardiomegaly. Lungs are clear. No pneumothorax or pleural effusion is noted. Bony thorax is unremarkable. IMPRESSION: No acute cardiopulmonary abnormality seen. Aortic Atherosclerosis (ICD10-I70.0). Electronically Signed   By: Marijo Conception M.D.   On: 02/11/2021 13:22   CT Head Wo Contrast  Result Date: 02/11/2021 CLINICAL DATA:  Right facial droop EXAM: CT HEAD WITHOUT CONTRAST TECHNIQUE: Contiguous axial images were obtained from the base of the skull through the vertex without intravenous contrast. COMPARISON:  01/27/2021 FINDINGS: Brain: No evidence of acute infarction, hemorrhage, hydrocephalus, or extra-axial collection. 3.8 x 3.9 cm meningioma arising from the left upper tentorium (series 3/image 17), with mild mass effect on the left temporo-occipital region, unchanged. Vascular: Mild subcortical white matter and periventricular small vessel ischemic changes. Skull: Normal. Negative for fracture or focal lesion. Sinuses/Orbits: Near complete opacification of the right maxillary sinus. Partial opacification of the right frontal and sphenoid sinuses. Mastoid air cells are  clear. Other: None. IMPRESSION: No evidence of acute intracranial abnormality. Stable 3.9 cm meningioma arising from the left upper tentorium. Electronically Signed   By: Julian Hy M.D.   On: 02/11/2021 10:24   DG Pelvis Portable  Result Date: 02/11/2021 CLINICAL DATA:  Weakness. EXAM: PORTABLE PELVIS 1-2 VIEWS COMPARISON:  September 04, 2012. FINDINGS: There is no evidence of acute pelvic fracture or diastasis. No pelvic bone lesions are seen. Status post right total hip arthroplasty. Status post surgical internal fixation of old proximal left femoral fracture. There appears to be  chronic resorption of the left femoral head with degenerative joint disease of the left hip. IMPRESSION: No acute abnormality seen in the pelvis. Electronically Signed   By: Marijo Conception M.D.   On: 02/11/2021 13:25    ____________________________________________   PROCEDURES  Procedure(s) performed (including Critical Care):  .1-3 Lead EKG Interpretation Performed by: Lucrezia Starch, MD Authorized by: Lucrezia Starch, MD     Interpretation: normal     ECG rate assessment: normal     Rhythm: sinus rhythm     Ectopy: none     Conduction: normal       ____________________________________________   INITIAL IMPRESSION / ASSESSMENT AND PLAN / ED COURSE      Patient presents with above to history exam initially with concern for possible right-sided facial droop although he does not appear to have any significant droop on arrival.  History is fairly limited from the patient as he states he is hurting everywhere although is unable to clarify when this began or further specify any focal symptoms.  He denies any recent falls or injuries although does state he has been very weak unable to walk around the last couple days.  On arrival he is afebrile hemodynamically stable.  He is weak in his lower extremities although he has a nonfocal exam otherwise.  No obvious evidence of trauma on exam.  I was able to speak with his daughter Ms. Marshell Levan who stated that patient has actually had several falls in the last couple of days and is currently residing at home alone and gets intermittent home health but she believes requires 24-hour supervision as he is unable to take care of himself, needs the bathroom on his own or feed himself.  She also states he has not been eating.  Patient denies this.  No obvious evidence of acute pathology on CT head.  Chest x-ray pelvic x-ray unremarkable for acute trauma or acute infectious process.  CBC shows no leukocytosis and hemoglobin of 10.1 compared to 12.12  weeks ago.  CMP remarkable for hyponatremia with NA 127 compared to 1.92 weeks ago and creatinine at baseline.  No other significant derangements.  Ammonia TSH and ethanol unremarkable.  Patient is Covid positive.  Certainly possible he is describing myalgias and history I suspect is Covid is likely contributing to his overall weakness.  He has not hypoxic.  However given history of multiple recent falls at home in general weakness I do not believe patient is safe for discharge and wants admission for further evaluation management.  Unclear if his hyponatremia is contributing to his weakness at this time although we will plan to gently hydrate while in the ED.  I will plan to admit to medicine service for further evaluation management.     ____________________________________________   FINAL CLINICAL IMPRESSION(S) / ED DIAGNOSES  Final diagnoses:  Weakness  COVID  Hyponatremia    Medications  aspirin EC tablet 81 mg (  has no administration in time range)  amLODipine (NORVASC) tablet 5 mg (has no administration in time range)  atorvastatin (LIPITOR) tablet 40 mg (has no administration in time range)  carvedilol (COREG) tablet 6.25 mg (has no administration in time range)  isosorbide mononitrate (IMDUR) 24 hr tablet 30 mg (has no administration in time range)  nitroGLYCERIN (NITROSTAT) SL tablet 0.4 mg (has no administration in time range)  QUEtiapine (SEROQUEL) tablet 50 mg (has no administration in time range)  clopidogrel (PLAVIX) tablet 75 mg (has no administration in time range)  gabapentin (NEURONTIN) capsule 300 mg (has no administration in time range)  calcium carbonate (OSCAL) tablet 1,500 mg (has no administration in time range)  feeding supplement (ENSURE ENLIVE / ENSURE PLUS) liquid 237 mL (has no administration in time range)  albuterol (VENTOLIN HFA) 108 (90 Base) MCG/ACT inhaler 2 puff (has no administration in time range)  multivitamin with minerals tablet 1 tablet (has no  administration in time range)  fluticasone (FLONASE) 50 MCG/ACT nasal spray 2 spray (has no administration in time range)  ipratropium (ATROVENT) 0.03 % nasal spray 2 spray (has no administration in time range)  Fluticasone-Umeclidin-Vilant 200-62.5-25 MCG/INH AEPB 1 puff (has no administration in time range)  acetaminophen (TYLENOL) tablet 325 mg (has no administration in time range)  ondansetron (ZOFRAN) tablet 4 mg (has no administration in time range)    Or  ondansetron (ZOFRAN) injection 4 mg (has no administration in time range)  heparin injection 5,000 Units (has no administration in time range)  remdesivir 200 mg in sodium chloride 0.9% 250 mL IVPB (has no administration in time range)    Followed by  remdesivir 100 mg in sodium chloride 0.9 % 100 mL IVPB (has no administration in time range)  guaiFENesin-dextromethorphan (ROBITUSSIN DM) 100-10 MG/5ML syrup 10 mL (has no administration in time range)  chlorpheniramine-HYDROcodone (TUSSIONEX) 10-8 MG/5ML suspension 5 mL (has no administration in time range)  cefTRIAXone (ROCEPHIN) 1 g in sodium chloride 0.9 % 100 mL IVPB (has no administration in time range)  acetaminophen (TYLENOL) tablet 1,000 mg (1,000 mg Oral Given 02/11/21 1400)  lactated ringers bolus 500 mL (500 mLs Intravenous New Bag/Given 02/11/21 1400)     ED Discharge Orders         Ordered    Ambulatory referral for Covid Treatment        02/11/21 1203           Note:  This document was prepared using Dragon voice recognition software and may include unintentional dictation errors.   Lucrezia Starch, MD 02/11/21 [phone]

## 2021-02-11 NOTE — ED Notes (Signed)
Pt given sandwich tray and apple juice, ok per Mickel Baas, RN.

## 2021-02-11 NOTE — ED Triage Notes (Signed)
Reported via EMS from home for new onset right sided facial droop. Daughter noticed this yesterday, but unsure of symptom onset. Patients only complain is chronic pain.

## 2021-02-11 NOTE — ED Notes (Signed)
Patient up on the side of the bed to eat dinner. Call bell within reaching distance. Advised patient to call when ready to lay back down. Patient reports 10/10 pain in entire body, especially in hips. Dr. Tobie Poet notified. Awaiting further orders.

## 2021-02-12 DIAGNOSIS — E785 Hyperlipidemia, unspecified: Secondary | ICD-10-CM | POA: Diagnosis present

## 2021-02-12 DIAGNOSIS — I130 Hypertensive heart and chronic kidney disease with heart failure and stage 1 through stage 4 chronic kidney disease, or unspecified chronic kidney disease: Secondary | ICD-10-CM | POA: Diagnosis present

## 2021-02-12 DIAGNOSIS — F1020 Alcohol dependence, uncomplicated: Secondary | ICD-10-CM | POA: Diagnosis present

## 2021-02-12 DIAGNOSIS — I252 Old myocardial infarction: Secondary | ICD-10-CM | POA: Diagnosis not present

## 2021-02-12 DIAGNOSIS — Z6841 Body Mass Index (BMI) 40.0 and over, adult: Secondary | ICD-10-CM | POA: Diagnosis not present

## 2021-02-12 DIAGNOSIS — Z8546 Personal history of malignant neoplasm of prostate: Secondary | ICD-10-CM | POA: Diagnosis not present

## 2021-02-12 DIAGNOSIS — I5042 Chronic combined systolic (congestive) and diastolic (congestive) heart failure: Secondary | ICD-10-CM | POA: Diagnosis present

## 2021-02-12 DIAGNOSIS — K5909 Other constipation: Secondary | ICD-10-CM | POA: Diagnosis present

## 2021-02-12 DIAGNOSIS — F1010 Alcohol abuse, uncomplicated: Secondary | ICD-10-CM | POA: Diagnosis not present

## 2021-02-12 DIAGNOSIS — B952 Enterococcus as the cause of diseases classified elsewhere: Secondary | ICD-10-CM | POA: Diagnosis present

## 2021-02-12 DIAGNOSIS — R531 Weakness: Secondary | ICD-10-CM | POA: Diagnosis not present

## 2021-02-12 DIAGNOSIS — G4700 Insomnia, unspecified: Secondary | ICD-10-CM | POA: Diagnosis present

## 2021-02-12 DIAGNOSIS — Z1611 Resistance to penicillins: Secondary | ICD-10-CM | POA: Diagnosis present

## 2021-02-12 DIAGNOSIS — J449 Chronic obstructive pulmonary disease, unspecified: Secondary | ICD-10-CM | POA: Diagnosis present

## 2021-02-12 DIAGNOSIS — F319 Bipolar disorder, unspecified: Secondary | ICD-10-CM | POA: Diagnosis present

## 2021-02-12 DIAGNOSIS — E871 Hypo-osmolality and hyponatremia: Secondary | ICD-10-CM | POA: Diagnosis present

## 2021-02-12 DIAGNOSIS — N184 Chronic kidney disease, stage 4 (severe): Secondary | ICD-10-CM | POA: Diagnosis present

## 2021-02-12 DIAGNOSIS — I2510 Atherosclerotic heart disease of native coronary artery without angina pectoris: Secondary | ICD-10-CM | POA: Diagnosis present

## 2021-02-12 DIAGNOSIS — U071 COVID-19: Secondary | ICD-10-CM | POA: Diagnosis present

## 2021-02-12 DIAGNOSIS — Z955 Presence of coronary angioplasty implant and graft: Secondary | ICD-10-CM | POA: Diagnosis not present

## 2021-02-12 DIAGNOSIS — E662 Morbid (severe) obesity with alveolar hypoventilation: Secondary | ICD-10-CM | POA: Diagnosis present

## 2021-02-12 DIAGNOSIS — D329 Benign neoplasm of meninges, unspecified: Secondary | ICD-10-CM | POA: Diagnosis present

## 2021-02-12 DIAGNOSIS — I255 Ischemic cardiomyopathy: Secondary | ICD-10-CM | POA: Diagnosis present

## 2021-02-12 DIAGNOSIS — Z96641 Presence of right artificial hip joint: Secondary | ICD-10-CM | POA: Diagnosis present

## 2021-02-12 DIAGNOSIS — R296 Repeated falls: Secondary | ICD-10-CM | POA: Diagnosis present

## 2021-02-12 DIAGNOSIS — Z79899 Other long term (current) drug therapy: Secondary | ICD-10-CM | POA: Diagnosis not present

## 2021-02-12 LAB — CBC WITH DIFFERENTIAL/PLATELET
Abs Immature Granulocytes: 0.15 10*3/uL — ABNORMAL HIGH (ref 0.00–0.07)
Basophils Absolute: 0 10*3/uL (ref 0.0–0.1)
Basophils Relative: 0 %
Eosinophils Absolute: 0.1 10*3/uL (ref 0.0–0.5)
Eosinophils Relative: 2 %
HCT: 28.4 % — ABNORMAL LOW (ref 39.0–52.0)
Hemoglobin: 9.4 g/dL — ABNORMAL LOW (ref 13.0–17.0)
Immature Granulocytes: 2 %
Lymphocytes Relative: 21 %
Lymphs Abs: 1.3 10*3/uL (ref 0.7–4.0)
MCH: 27.7 pg (ref 26.0–34.0)
MCHC: 33.1 g/dL (ref 30.0–36.0)
MCV: 83.8 fL (ref 80.0–100.0)
Monocytes Absolute: 0.7 10*3/uL (ref 0.1–1.0)
Monocytes Relative: 11 %
Neutro Abs: 4 10*3/uL (ref 1.7–7.7)
Neutrophils Relative %: 64 %
Platelets: 234 10*3/uL (ref 150–400)
RBC: 3.39 MIL/uL — ABNORMAL LOW (ref 4.22–5.81)
RDW: 15.3 % (ref 11.5–15.5)
WBC: 6.3 10*3/uL (ref 4.0–10.5)
nRBC: 0 % (ref 0.0–0.2)

## 2021-02-12 LAB — COMPREHENSIVE METABOLIC PANEL
ALT: 43 U/L (ref 0–44)
AST: 28 U/L (ref 15–41)
Albumin: 2.5 g/dL — ABNORMAL LOW (ref 3.5–5.0)
Alkaline Phosphatase: 97 U/L (ref 38–126)
Anion gap: 7 (ref 5–15)
BUN: 35 mg/dL — ABNORMAL HIGH (ref 8–23)
CO2: 25 mmol/L (ref 22–32)
Calcium: 8.3 mg/dL — ABNORMAL LOW (ref 8.9–10.3)
Chloride: 98 mmol/L (ref 98–111)
Creatinine, Ser: 2.13 mg/dL — ABNORMAL HIGH (ref 0.61–1.24)
GFR, Estimated: 30 mL/min — ABNORMAL LOW (ref >60)
Glucose, Bld: 100 mg/dL — ABNORMAL HIGH (ref 70–99)
Potassium: 4 mmol/L (ref 3.5–5.1)
Sodium: 130 mmol/L — ABNORMAL LOW (ref 135–145)
Total Bilirubin: 0.7 mg/dL (ref 0.3–1.2)
Total Protein: 6.1 g/dL — ABNORMAL LOW (ref 6.5–8.1)

## 2021-02-12 LAB — C-REACTIVE PROTEIN: CRP: 13.9 mg/dL — ABNORMAL HIGH (ref <1.0)

## 2021-02-12 LAB — D-DIMER, QUANTITATIVE: D-Dimer, Quant: 3.19 ug/mL-FEU — ABNORMAL HIGH (ref 0.00–0.50)

## 2021-02-12 MED ORDER — HYDROCODONE-ACETAMINOPHEN 5-325 MG PO TABS
1.0000 | ORAL_TABLET | Freq: Two times a day (BID) | ORAL | Status: DC | PRN
  Administered 2021-02-12 – 2021-02-16 (×7): 1 via ORAL
  Filled 2021-02-12 (×7): qty 1

## 2021-02-12 MED ORDER — SODIUM CHLORIDE 0.9 % IV SOLN
INTRAVENOUS | Status: DC | PRN
  Administered 2021-02-12: 250 mL via INTRAVENOUS

## 2021-02-12 NOTE — Evaluation (Addendum)
Physical Therapy Evaluation Patient Details Name: [name] MRN: [id number] DOB: 12-29-1934 Today's Date: 02/12/2021   History of Present Illness  Pt is an 86 y/o M admitted from home on 02/11/21 with c/c of facial droop.  No facial droop was appreciated on physical exam, CT of the head was negative. Pt tested (+) for COVID. PMH: CAD s/p DES in R coronary artery 2005, HTN, NIDDM, HLD, chronic diastolic/systolic heart failure, sleep apnea not on CPAP, morbid obesity, tobacco abuse, alcohol abuse, CKD4, bipolar disorder with behavioral disturbances/agitation, PAD, mild to moderate aortic stenosis, alcoholism, asthma, DJD  Clinical Impression  Pt seen for PT evaluation with co-tx with OT to maximize pt participation & for pt/therapists safety. Pt very irritable throughout session, requiring passive encouragement to get OOB to recliner. Pt requires +2 assist for safely mobility for stand pivot to recliner. Pt found to be incontinent of urine & BM smear & is initially agreeable to assistance with peri hygiene but then declines as pt adamantly wants to eat breakfast. Pt not open to further mobility attempts at this time & even becomes more irritable when PT adjusts height of tray table for increased ease of eating breakfast. Pt is currently unsafe to d/c home alone and would benefit from rehab services to maximize independence, reduce fall risk & reduce need for caregiver assistance if pt is willing to participate. Will continue to see pt acutely to progress gait as able & address balance & activity tolerance.   Addendum: Pt with poor awareness & attempts at maintaining LLE trochanteric precautions.    Follow Up Recommendations SNF;Supervision for mobility/OOB    Equipment Recommendations   (TBD in next venue)    Recommendations for Other Services       Precautions / Restrictions Precautions Precautions: Fall Precaution Comments: LLE trochanteric precautions (no active hip  abduction) Restrictions Weight Bearing Restrictions: No      Mobility  Bed Mobility Overal bed mobility: Needs Assistance Bed Mobility: Supine to Sit     Supine to sit: Min assist;+2 for physical assistance;HOB elevated     General bed mobility comments: assistance to upright trunk to sitting EOB    Transfers Overall transfer level: Needs assistance Equipment used: 2 person hand held assist Transfers: Sit to/from [facility] Sit to Stand: Mod assist;+2 physical assistance;From elevated surface Stand pivot transfers: Mod assist;+2 physical assistance       General transfer comment: Pt with BLE knee weakness during transfer, requires mod assist +2 for safe stand pivot transfer to recliner with BUE HHA.  Ambulation/Gait                Stairs            Wheelchair Mobility    Modified Rankin (Stroke Patients Only)       Balance Overall balance assessment: Needs assistance Sitting-balance support: Feet supported Sitting balance-Leahy Scale: Fair Sitting balance - Comments: supervision sitting EOB   Standing balance support: Bilateral upper extremity supported;During functional activity Standing balance-Leahy Scale: Zero                               Pertinent Vitals/Pain Pain Assessment: Faces Faces Pain Scale: No hurt    Home Living Family/patient expects to be discharged to:: Private residence Living Arrangements: Alone Available Help at Discharge: Family;Available PRN/intermittently Type of Home: Apartment Home Access: Elevator     Home Layout: One level Home Equipment: Walker - 4 wheels;Wheelchair -  manual;Bedside commode Additional Comments: Lives on fourth floor of senior apartment complex    Prior Function Level of Independence: Needs assistance         Comments: Pt reports he transfers to rollater and scoots himself around on it. Pt reports he hasn't walked in a couple months, "maybe longer than that".      Hand Dominance   Dominant Hand: Left    Extremity/Trunk Assessment   Upper Extremity Assessment Upper Extremity Assessment: Generalized weakness    Lower Extremity Assessment Lower Extremity Assessment: Generalized weakness       Communication   Communication: No difficulties  Cognition Arousal/Alertness: Awake/alert Behavior During Therapy: Agitated Overall Cognitive Status: No family/caregiver present to determine baseline cognitive functioning                                 General Comments: Pt irritable throughout session, adamantly wanting to eat breakfast despite therapist's offering assistance for peri hygiene 2/2 incontinent BM smear.      General Comments General comments (skin integrity, edema, etc.): HR up to 96 bpm with transfer    Exercises     Assessment/Plan    PT Assessment Patient needs continued PT services  PT Problem List Decreased strength;Decreased balance;Decreased knowledge of precautions;Decreased mobility;Decreased knowledge of use of DME;Cardiopulmonary status limiting activity;Obesity;Decreased safety awareness;Decreased activity tolerance       PT Treatment Interventions DME instruction;Functional mobility training;Balance training;Patient/family education;Wheelchair mobility training;Neuromuscular re-education;Therapeutic activities;Gait training;Stair training;Therapeutic exercise;Manual techniques    PT Goals (Current goals can be found in the Care Plan section)  Acute Rehab PT Goals Patient Stated Goal: eat breakfast PT Goal Formulation: With patient Time For Goal Achievement: 02/25/21 Potential to Achieve Goals: Poor    Frequency Min 2X/week   Barriers to discharge Decreased caregiver support      Co-evaluation PT/OT/SLP Co-Evaluation/Treatment: Yes Reason for Co-Treatment: Complexity of the patient's impairments (multi-system involvement);Necessary to address cognition/behavior during functional  activity;For patient/therapist safety;To address functional/ADL transfers PT goals addressed during session: Mobility/safety with mobility;Balance         AM-PAC PT "6 Clicks" Mobility  Outcome Measure Help needed turning from your back to your side while in a flat bed without using bedrails?: A Little Help needed moving from lying on your back to sitting on the side of a flat bed without using bedrails?: A Lot Help needed moving to and from a bed to a chair (including a wheelchair)?: Total Help needed standing up from a chair using your arms (e.g., wheelchair or bedside chair)?: Total Help needed to walk in hospital room?: Total Help needed climbing 3-5 steps with a railing? : Total 6 Click Score: 9    End of Session   Activity Tolerance: Treatment limited secondary to agitation Patient left: in chair;with call bell/phone within reach;with chair alarm set   PT Visit Diagnosis: Unsteadiness on feet (R26.81);Muscle weakness (generalized) (M62.81);Difficulty in walking, not elsewhere classified (R26.2)    Time: 8309-4076 PT Time Calculation (min) (ACUTE ONLY): 10 min   Charges:   PT Evaluation $PT Eval Low Complexity: [name], PT, [name] 02/12/21, 10:54 AM   [name] 02/12/2021, 10:50 AM

## 2021-02-12 NOTE — Progress Notes (Signed)
PROGRESS NOTE    [name]  ELF:[id number] DOB: 05-Jun-1935 DOA: 02/11/2021 PCP: Nicholas Merles, MD    Assessment & Plan:   Principal Problem:   COVID-19 Active Problems:   Coronary artery disease   Essential hypertension   COPD (chronic obstructive pulmonary disease) with chronic bronchitis (HCC)   Obstructive sleep apnea   Alcohol abuse   Chronic constipation   CKD (chronic kidney disease), stage IV (HCC)   History of alcohol abuse   Class 3 obesity with alveolar hypoventilation and body mass index (BMI) of 40.0 to 44.9 in adult [facility])   Weakness   [name] is a 86 y.o. male with medical history significant for CAD status post DES in the right coronary artery in 2005, hypertension, non-insulin-dependent diabetes mellitus, hyperlipidemia, chronic diastolic/systolic heart failure, sleep apnea not on CPAP, morbid obesity, previous tobacco abuse, previous history of alcohol abuse, CKD4, bipolar disorder with behavioral disturbances/agitation, peripheral arterial disease, mild to moderate aortic stenosis, presented from home via EMS for chief concern from daughter of facial drooping.   Reported facial droop, ruled out No facial droop was appreciated on physical exam, CT of the head was negative. --Monitor for focal neuro deficit.  No need for MRI brain this time.  generalized weakness  --recent hip fracture, reported poor cooperation at rehab.   Plan: --PT/OT rec SNF rehab, TOC contacted to initiate search  COVID-19 infection --Not symptomatic, no hypoxia, CXR wnl. Plan: --IV Remdesivir for 3 days  Bacteruria, asymptomatic --d/c ceftriaxone, no need to treat.  Hypertension -takes home amlodipine 5 mg daily, Coreg 6.25 mg twice daily, isosorbide mononitrate 30 mg daily --cont home regimen  Insomnia-quetiapine 50 mg daily at bedtime  Hyperlipidemia-atorvastatin 40 mg daily  CAD-clopidogrel 75 mg daily, aspirin 81 mg daily, Coreg,  atorvastatin,  Neuropathy-gabapentin 300 mg twice daily  COPD-trilogy Ellipta resumed,  OSA-CPAP, questionable compliance -CPAP nightly ordered   DVT prophylaxis: Lovenox SQ Code Status: Full code  Family Communication:  Level of care: Med-Surg Dispo:   The patient is from: home Anticipated d/c is to: SNF rehab Anticipated d/c date is: whenever bed available Patient currently is medically ready to d/c.   Subjective and Interval History:  Pt denied N/V/D.  No dyspnea, no cough.    PT found pt very weak and recommended SNF rehab.   Objective: Vitals:   02/11/21 2347 02/12/21 0026 02/12/21 0531 02/12/21 1115  BP:   (!) 128/55 (!) 132/53  Pulse:  80 63 63  Resp: 16 20 18 19   Temp: 97.6 F (36.4 C) 97.7 F (36.5 C) (!) 97.4 F (36.3 C) (!) 97.3 F (36.3 C)  TempSrc: Oral Oral Oral Oral  SpO2:  96% 95% 100%  Weight:      Height:        Intake/Output Summary (Last 24 hours) at 02/12/2021 1859 Last data filed at 02/12/2021 1739 Gross per 24 hour  Intake 366.78 ml  Output 2050 ml  Net -1683.22 ml   Filed Weights   02/11/21 0936  Weight: 126 kg    Examination:   Constitutional: NAD, AAOx3 HEENT: conjunctivae and lids normal, EOMI CV: No cyanosis.   RESP: normal respiratory effort, on RA Extremities: No effusions, edema in BLE SKIN: warm, dry Neuro: II - XII grossly intact.     Data Reviewed: I have personally reviewed following labs and imaging studies  CBC: Recent Labs  Lab 02/11/21 0946 02/12/21 0537  WBC 8.1 6.3  NEUTROABS 5.7 4.0  HGB 10.1* 9.4*  HCT 30.0* 28.4*  MCV 83.3 83.8  PLT 240 998   Basic Metabolic Panel: Recent Labs  Lab 02/11/21 0946 02/12/21 0537  NA 127* 130*  K 4.0 4.0  CL 94* 98  CO2 23 25  GLUCOSE 116* 100*  BUN 35* 35*  CREATININE 2.28* 2.13*  CALCIUM 8.3* 8.3*   GFR: Estimated Creatinine Clearance: 32.9 mL/min (A) (by C-G formula based on SCr of 2.13 mg/dL (H)). Liver Function Tests: Recent Labs  Lab  02/11/21 0946 02/12/21 0537  AST 33 28  ALT 55* 43  ALKPHOS 111 97  BILITOT 1.1 0.7  PROT 6.9 6.1*  ALBUMIN 2.9* 2.5*   No results for input(s): LIPASE, AMYLASE in the last 168 hours. Recent Labs  Lab 02/11/21 0946  AMMONIA 11   Coagulation Profile: No results for input(s): INR, PROTIME in the last 168 hours. Cardiac Enzymes: No results for input(s): CKTOTAL, CKMB, CKMBINDEX, TROPONINI in the last 168 hours. BNP (last 3 results) No results for input(s): PROBNP in the last 8760 hours. HbA1C: No results for input(s): HGBA1C in the last 72 hours. CBG: No results for input(s): GLUCAP in the last 168 hours. Lipid Profile: No results for input(s): CHOL, HDL, LDLCALC, TRIG, CHOLHDL, LDLDIRECT in the last 72 hours. Thyroid Function Tests: Recent Labs    02/11/21 0946  TSH 1.388   Anemia Panel: No results for input(s): VITAMINB12, FOLATE, FERRITIN, TIBC, IRON, RETICCTPCT in the last 72 hours. Sepsis Labs: Recent Labs  Lab 02/11/21 1645  PROCALCITON 1.29    Recent Results (from the past 240 hour(s))  Resp Panel by RT-PCR (Flu A&B, Covid) Nasopharyngeal Swab     Status: Abnormal   Collection Time: 02/11/21  9:46 AM   Specimen: Nasopharyngeal Swab; Nasopharyngeal(NP) swabs in vial transport medium  Result Value Ref Range Status   SARS Coronavirus 2 by RT PCR POSITIVE (A) NEGATIVE Final    Comment: RESULT CALLED TO, READ BACK BY AND VERIFIED WITH:  [name] AT 1104 02/11/21 SDR (NOTE) SARS-CoV-2 target nucleic acids are DETECTED.  The SARS-CoV-2 RNA is generally detectable in upper respiratory specimens during the acute phase of infection. Positive results are indicative of the presence of the identified virus, but do not rule out bacterial infection or co-infection with other pathogens not detected by the test. Clinical correlation with patient history and other diagnostic information is necessary to determine patient infection status. The expected result is  Negative.  Fact Sheet for Patients: EntrepreneurPulse.com.au  Fact Sheet for Healthcare Providers: IncredibleEmployment.be  This test is not yet approved or cleared by the [place] FDA and  has been authorized for detection and/or diagnosis of SARS-CoV-2 by FDA under an Emergency Use Authorization (EUA).  This EUA will remain in effect (meaning this test can  be used) for the duration of  the COVID-19 declaration under Section 564(b)(1) of the Act, 21 U.S.C. section 360bbb-3(b)(1), unless the authorization is terminated or revoked sooner.     Influenza A by PCR NEGATIVE NEGATIVE Final   Influenza B by PCR NEGATIVE NEGATIVE Final    Comment: (NOTE) The Xpert Xpress SARS-CoV-2/FLU/RSV plus assay is intended as an aid in the diagnosis of influenza from Nasopharyngeal swab specimens and should not be used as a sole basis for treatment. Nasal washings and aspirates are unacceptable for Xpert Xpress SARS-CoV-2/FLU/RSV testing.  Fact Sheet for Patients: EntrepreneurPulse.com.au  Fact Sheet for Healthcare Providers: IncredibleEmployment.be  This test is not yet approved or cleared by the [place] and has been authorized  for detection and/or diagnosis of SARS-CoV-2 by FDA under an Emergency Use Authorization (EUA). This EUA will remain in effect (meaning this test can be used) for the duration of the COVID-19 declaration under Section 564(b)(1) of the Act, 21 U.S.C. section 360bbb-3(b)(1), unless the authorization is terminated or revoked.  Performed at [facility], [address] [place], [place] [address]   Blood culture (routine x 2)     Status: None (Preliminary result)   Collection Time: 02/11/21  4:43 PM   Specimen: BLOOD  Result Value Ref Range Status   Specimen Description BLOOD LEFT ANTECUBITAL  Final   Special Requests   Final    BOTTLES DRAWN AEROBIC AND ANAEROBIC Blood  Culture adequate volume   Culture   Final    NO GROWTH < 24 HOURS Performed at [facility], [address] [place], [place] [address]    Report Status PENDING  Incomplete  Blood culture (routine x 2)     Status: None (Preliminary result)   Collection Time: 02/11/21  4:44 PM   Specimen: BLOOD  Result Value Ref Range Status   Specimen Description BLOOD BLOOD LEFT HAND  Final   Special Requests   Final    BOTTLES DRAWN AEROBIC AND ANAEROBIC Blood Culture adequate volume   Culture   Final    NO GROWTH < 24 HOURS Performed at [facility], [address] [place], [place] [address]    Report Status PENDING  Incomplete      Radiology Studies: DG Chest 1 View  Result Date: 02/11/2021 CLINICAL DATA:  Weakness, COVID-19. EXAM: CHEST  1 VIEW COMPARISON:  January 08, 2021. FINDINGS: Stable cardiomegaly. Lungs are clear. No pneumothorax or pleural effusion is noted. Bony thorax is unremarkable. IMPRESSION: No acute cardiopulmonary abnormality seen. Aortic Atherosclerosis (ICD10-I70.0). Electronically Signed   By: Marijo Conception M.D.   On: 02/11/2021 13:22   CT Head Wo Contrast  Result Date: 02/11/2021 CLINICAL DATA:  Right facial droop EXAM: CT HEAD WITHOUT CONTRAST TECHNIQUE: Contiguous axial images were obtained from the base of the skull through the vertex without intravenous contrast. COMPARISON:  01/27/2021 FINDINGS: Brain: No evidence of acute infarction, hemorrhage, hydrocephalus, or extra-axial collection. 3.8 x 3.9 cm meningioma arising from the left upper tentorium (series 3/image 17), with mild mass effect on the left temporo-occipital region, unchanged. Vascular: Mild subcortical white matter and periventricular small vessel ischemic changes. Skull: Normal. Negative for fracture or focal lesion. Sinuses/Orbits: Near complete opacification of the right maxillary sinus. Partial opacification of the right frontal and sphenoid sinuses. Mastoid air cells are clear.  Other: None. IMPRESSION: No evidence of acute intracranial abnormality. Stable 3.9 cm meningioma arising from the left upper tentorium. Electronically Signed   By: Julian Hy M.D.   On: 02/11/2021 10:24   DG Pelvis Portable  Result Date: 02/11/2021 CLINICAL DATA:  Weakness. EXAM: PORTABLE PELVIS 1-2 VIEWS COMPARISON:  September 04, 2012. FINDINGS: There is no evidence of acute pelvic fracture or diastasis. No pelvic bone lesions are seen. Status post right total hip arthroplasty. Status post surgical internal fixation of old proximal left femoral fracture. There appears to be chronic resorption of the left femoral head with degenerative joint disease of the left hip. IMPRESSION: No acute abnormality seen in the pelvis. Electronically Signed   By: Marijo Conception M.D.   On: 02/11/2021 13:25     Scheduled Meds: . albuterol  2 puff Inhalation Q6H WA  . amLODipine  5 mg Oral Daily  .  aspirin EC  81 mg Oral Daily  . atorvastatin  40 mg Oral Daily  . calcium carbonate  500 mg of elemental calcium Oral BID WC  . carvedilol  6.25 mg Oral BID WC  . clopidogrel  75 mg Oral Daily  . enoxaparin (LOVENOX) injection  40 mg Subcutaneous Q24H  . feeding supplement  237 mL Oral TID BM  . fluticasone  2 spray Each Nare Daily  . fluticasone furoate-vilanterol  1 puff Inhalation Daily  . gabapentin  300 mg Oral TID  . ipratropium  2 spray Each Nare Daily  . isosorbide mononitrate  30 mg Oral Daily  . multivitamin with minerals  1 tablet Oral Daily  . QUEtiapine  50 mg Oral QHS  . umeclidinium bromide  1 puff Inhalation Daily   Continuous Infusions: . sodium chloride Stopped (02/12/21 1629)  . remdesivir 100 mg in NS 100 mL Stopped (02/12/21 1653)     LOS: 0 days     Enzo Bi, MD [facility] If 7PM-7AM, please contact night-coverage 02/12/2021, 6:59 PM

## 2021-02-12 NOTE — TOC Progression Note (Signed)
Transition of Care [facility]) - Progression Note    Patient Details  Name: [name] MRN: [id number] Date of Birth: 03/11/1935  Transition of [facility]) CM/SW Contact  Truitt Merle, LCSW Phone Number: 02/12/2021, 5:18 PM  Clinical Narrative:    LCSW attempted to contact patient's legal guardian [place] at [phone]. No answer. Left HIPPA-friendly voicemail. TOC continuing to follow for discharge needs.       Expected Discharge Plan and Services                                                 Social Determinants of Health (SDOH) Interventions    Readmission Risk Interventions No flowsheet data found.

## 2021-02-12 NOTE — Evaluation (Signed)
Occupational Therapy Evaluation Patient Details Name: [name] MRN: [id number] DOB: [birth year] Today's Date: 02/12/2021    History of Present Illness Pt is an 86 y/o M admitted from home on 02/11/21 with c/c of facial droop.  No facial droop was appreciated on physical exam, CT of the head was negative. Pt tested (+) for COVID. PMH: CAD s/p DES in R coronary artery 2005, HTN, NIDDM, HLD, chronic diastolic/systolic heart failure, sleep apnea not on CPAP, morbid obesity, tobacco abuse, alcohol abuse, CKD4, bipolar disorder with behavioral disturbances/agitation, PAD, mild to moderate aortic stenosis, alcoholism, asthma, DJD   Clinical Impression   Pt seen for OT evaluation this date in setting of acute hospitalization d/t COVID. Difficult to gather current PLOF/home setup information from pt as he is somewhat confused and intermittently agitated throughout session. Per chart review, he lives in a senior apartment with elevator and sits in rollator seat, using his LEs to propel like a wheelchair. Pt presents this date with gross weakness and deconditioning of both UEs/LEs and trunk. It is difficult to engage pt in any meaningful task. Based on clinical observation, pt at least needs SETUP for self feed as he is seen attempting to eat [place] toast with syrup with his hands. In addition, pt appears to require MAX to TOTAL A for LB ADLs including posterior peri care given the nature of his full brief and bed and seemingly uncaring nature towards being soiled. Pt requires MOD A +2 with arm in arm assist to perform fxl transfers at this time and is noted to have instability in the knees with standing. Overall, pt unsafe to return to living alone in any capacity. Anticipate he will require STR for strengthening and transfers with potential LTC needs following for overall safety. Will continue to follow acutely.     Follow Up Recommendations  SNF    Equipment Recommendations  Other (comment) (defer to next  level of care)    Recommendations for Other Services       Precautions / Restrictions Precautions Precautions: Fall Precaution Comments: LLE trochanteric precautions (no active hip abduction) Restrictions Weight Bearing Restrictions: No      Mobility Bed Mobility Overal bed mobility: Needs Assistance Bed Mobility: Supine to Sit     Supine to sit: Min assist;+2 for physical assistance;HOB elevated     General bed mobility comments: assistance to upright trunk to sitting EOB    Transfers Overall transfer level: Needs assistance Equipment used: 2 person hand held assist Transfers: Sit to/from [facility] Sit to Stand: Mod assist;+2 physical assistance;From elevated surface Stand pivot transfers: Mod assist;+2 physical assistance       General transfer comment: Pt with BLE knee weakness during transfer, requires mod assist +2 for safe stand pivot transfer to recliner with BUE HHA. Pt requires tactile/verbal cues. Demos some impulsivity possibly related to his agitation.    Balance Overall balance assessment: Needs assistance Sitting-balance support: Feet supported Sitting balance-Leahy Scale: Fair Sitting balance - Comments: supervision sitting EOB   Standing balance support: Bilateral upper extremity supported;During functional activity Standing balance-Leahy Scale: Zero Standing balance comment: requires bilateral external support and is noted to have some knee instability with SPS transfer, not quite full-scale knee buckling, but unstable and unsteady.                           ADL either performed or assessed with clinical judgement   ADL Overall ADL's : Needs assistance/impaired  General ADL Comments: difficult to engage pt in any meaningful task. Based on clinical observation, pt at least needs SETUP for self feed as he is seen attempting to eat [place] toast with syrup with his  hands. In addition, pt appears to require MAX to TOTAL A for LB ADLs including posterior peri care given the nature of his full brief and bed and seemingly uncaring nature towards being soiled.     Vision   Additional Comments: unable to formally assess, pt appears to at least have functional vision to perform transfer to chair in room     Perception     Praxis      Pertinent Vitals/Pain Pain Assessment: Faces Faces Pain Scale: No hurt     Hand Dominance Left   Extremity/Trunk Assessment Upper Extremity Assessment Upper Extremity Assessment: Generalized weakness   Lower Extremity Assessment Lower Extremity Assessment: Generalized weakness       Communication Communication Communication: No difficulties   Cognition Arousal/Alertness: Awake/alert Behavior During Therapy: Agitated Overall Cognitive Status: No family/caregiver present to determine baseline cognitive functioning                                 General Comments: Pt with waxing and waning irritability, he will be agreeable to something and then in the same sentence, start yelling at therapists. Ex: Upon t/f to chair, pt is noted to have BM smear and heavy brief falling off. He is initially agreeable to standing for posterior peri care, but then when therapy team attempts to help, pt becomes agitated and exclaims to therapists to "just give me my food and leave me alone!" Similar conversational interactions for much of the session making cueing for safety and generally encouraging participation   General Comments  HR up to 96 bpm with transfer    Exercises Other Exercises Other Exercises: Attempts to educate pt this session were futile as he was very rapidly emotionally labile with both therapists; making guaging conversational cues wtih patient quite difficult.   Shoulder Instructions      Home Living Family/patient expects to be discharged to:: Private residence Living Arrangements:  Alone Available Help at Discharge: Family;Available PRN/intermittently Type of Home: Apartment Home Access: Elevator     Home Layout: One level               Home Equipment: Walker - 4 wheels;Wheelchair - manual;Bedside commode   Additional Comments: per chart review: Lives on fourth floor of senior apartment complex      Prior Functioning/Environment Level of Independence: Needs assistance  Gait / Transfers Assistance Needed: Pt not forthcoming with information this date. Old chart  reviews indicate he sits in his rollator seat and pedals with his feet for fxl mobility in his apartment ADL's / Homemaking Assistance Needed: Pt reports that he was able to perform basic self care and previous documentation indicates that pt's daughter assisted with groceries and MD appt transportation. Endorses some incontinence and wearing briefs.   Comments: inconsistent information regarding when he last walked; Pt reports he transfers to rollater and scoots himself around on it. Pt reports he hasn't walked in a couple months, "maybe longer than that".        OT Problem List: Decreased strength;Decreased activity tolerance;Impaired balance (sitting and/or standing);Decreased coordination;Decreased cognition;Decreased safety awareness;Decreased knowledge of use of DME or AE;Decreased knowledge of precautions;Cardiopulmonary status limiting activity;Obesity      OT Treatment/Interventions:  OT Goals(Current goals can be found in the care plan section) Acute Rehab OT Goals Patient Stated Goal: eat breakfast OT Goal Formulation: With patient Time For Goal Achievement: 02/26/21 Potential to Achieve Goals: Fair ADL Goals Pt Will Perform Lower Body Dressing: with min assist;with mod assist;sit to/from stand (with LRAD/AE PRN) Pt Will Transfer to Toilet: with min assist;with mod assist;stand pivot transfer;bedside commode Pt/caregiver will Perform Home Exercise Program: Increased strength;Both  right and left upper extremity;With minimal assist  OT Frequency:     Barriers to D/C:            Co-evaluation PT/OT/SLP Co-Evaluation/Treatment: Yes Reason for Co-Treatment: Complexity of the patient's impairments (multi-system involvement);For patient/therapist safety PT goals addressed during session: Mobility/safety with mobility OT goals addressed during session: ADL's and self-care      AM-PAC OT "6 Clicks" Daily Activity     Outcome Measure Help from another person eating meals?: A Little Help from another person taking care of personal grooming?: A Little Help from another person toileting, which includes using toliet, bedpan, or urinal?: Total Help from another person bathing (including washing, rinsing, drying)?: A Lot Help from another person to put on and taking off regular upper body clothing?: A Little Help from another person to put on and taking off regular lower body clothing?: Total 6 Click Score: 13   End of Session    Activity Tolerance:   Patient left:                     Time: 0626-9485 OT Time Calculation (min): 10 min Charges:  OT General Charges $OT Visit: 1 Visit OT Evaluation $OT Eval Moderate Complexity: [place], [place], OTR/L ascom [phone] 02/12/21, 11:22 AM

## 2021-02-13 LAB — PROCALCITONIN: Procalcitonin: 0.67 ng/mL

## 2021-02-13 LAB — BASIC METABOLIC PANEL
Anion gap: 5 (ref 5–15)
BUN: 40 mg/dL — ABNORMAL HIGH (ref 8–23)
CO2: 24 mmol/L (ref 22–32)
Calcium: 8.3 mg/dL — ABNORMAL LOW (ref 8.9–10.3)
Chloride: 103 mmol/L (ref 98–111)
Creatinine, Ser: 2.15 mg/dL — ABNORMAL HIGH (ref 0.61–1.24)
GFR, Estimated: 30 mL/min — ABNORMAL LOW (ref >60)
Glucose, Bld: 97 mg/dL (ref 70–99)
Potassium: 4.5 mmol/L (ref 3.5–5.1)
Sodium: 132 mmol/L — ABNORMAL LOW (ref 135–145)

## 2021-02-13 LAB — CBC
HCT: 28.2 % — ABNORMAL LOW (ref 39.0–52.0)
Hemoglobin: 9.2 g/dL — ABNORMAL LOW (ref 13.0–17.0)
MCH: 27.9 pg (ref 26.0–34.0)
MCHC: 32.6 g/dL (ref 30.0–36.0)
MCV: 85.5 fL (ref 80.0–100.0)
Platelets: 249 10*3/uL (ref 150–400)
RBC: 3.3 MIL/uL — ABNORMAL LOW (ref 4.22–5.81)
RDW: 15.5 % (ref 11.5–15.5)
WBC: 7.7 10*3/uL (ref 4.0–10.5)
nRBC: 0 % (ref 0.0–0.2)

## 2021-02-13 LAB — C-REACTIVE PROTEIN: CRP: 12.5 mg/dL — ABNORMAL HIGH (ref <1.0)

## 2021-02-13 LAB — MAGNESIUM: Magnesium: 2.5 mg/dL — ABNORMAL HIGH (ref 1.7–2.4)

## 2021-02-13 MED ORDER — ENOXAPARIN SODIUM 80 MG/0.8ML ~~LOC~~ SOLN
0.5000 mg/kg | SUBCUTANEOUS | Status: DC
  Administered 2021-02-13 – 2021-02-16 (×4): 62.5 mg via SUBCUTANEOUS
  Filled 2021-02-13 (×6): qty 0.8

## 2021-02-13 NOTE — Progress Notes (Signed)
PROGRESS NOTE    [name]  BZJ:[id number] DOB: 1935-11-26 DOA: 02/11/2021 PCP: Marguerita Merles, MD    Assessment & Plan:   Principal Problem:   COVID-19 Active Problems:   Coronary artery disease   Essential hypertension   COPD (chronic obstructive pulmonary disease) with chronic bronchitis (HCC)   Obstructive sleep apnea   Alcohol abuse   Chronic constipation   CKD (chronic kidney disease), stage IV (HCC)   History of alcohol abuse   Class 3 obesity with alveolar hypoventilation and body mass index (BMI) of 40.0 to 44.9 in adult [facility])   Weakness   [name] is a 86 y.o. male with medical history significant for CAD status post DES in the right coronary artery in 2005, hypertension, non-insulin-dependent diabetes mellitus, hyperlipidemia, chronic diastolic/systolic heart failure, sleep apnea not on CPAP, morbid obesity, previous tobacco abuse, previous history of alcohol abuse, CKD4, bipolar disorder with behavioral disturbances/agitation, peripheral arterial disease, mild to moderate aortic stenosis, presented from home via EMS for chief concern from daughter of facial drooping.   Reported facial droop, ruled out No facial droop was appreciated on physical exam, CT of the head was negative. --Monitor for focal neuro deficit.  No need for MRI brain this time.  generalized weakness  --recent hip fracture, reported poor cooperation at rehab.   Plan: --PT/OT rec SNF rehab, TOC contacted to initiate search  COVID-19 infection --Not symptomatic, no hypoxia, CXR wnl. Plan: --day 3 of 3 IV Remdesivir  Bacteruria, asymptomatic --d/c ceftriaxone, no need to treat.  Hypertension -takes home amlodipine 5 mg daily, Coreg 6.25 mg twice daily, isosorbide mononitrate 30 mg daily --cont home regimen  Insomnia -quetiapine 50 mg daily at bedtime  Hyperlipidemia -atorvastatin 40 mg daily  CAD -clopidogrel 75 mg daily, aspirin 81 mg daily, Coreg,  atorvastatin,  Neuropathy -gabapentin 300 mg twice daily  COPD -trilogy Ellipta  OSA-CPAP, questionable compliance -CPAP nightly ordered   DVT prophylaxis: Lovenox SQ Code Status: Full code  Family Communication:  Level of care: Med-Surg Dispo:   The patient is from: home Anticipated d/c is to: SNF rehab Anticipated d/c date is: whenever bed available Patient currently is medically ready to d/c.   Subjective and Interval History:  Pt reported having some phlegm, but no dyspnea or cough.  Normal oral intake.  No N/V/D.   Objective: Vitals:   02/12/21 1115 02/12/21 2124 02/13/21 0429 02/13/21 1048  BP: (!) 132/53 127/69 (!) 122/56 (!) 114/100  Pulse: 63 65 (!) 59 72  Resp: 19 20 (!) 22   Temp: (!) 97.3 F (36.3 C) 97.9 F (36.6 C) 97.7 F (36.5 C) 98.1 F (36.7 C)  TempSrc: Oral Oral Oral Oral  SpO2: 100% 99% 94% 98%  Weight:      Height:        Intake/Output Summary (Last 24 hours) at 02/13/2021 1606 Last data filed at 02/13/2021 0516 Gross per 24 hour  Intake 51.78 ml  Output 800 ml  Net -748.22 ml   Filed Weights   02/11/21 0936  Weight: 126 kg    Examination:   Constitutional: NAD, AAOx3, sitting in chair eating HEENT: conjunctivae and lids normal, EOMI CV: No cyanosis.   RESP: normal respiratory effort, on RA Extremities: No effusions, edema in BLE SKIN: warm, dry Neuro: II - XII grossly intact.     Data Reviewed: I have personally reviewed following labs and imaging studies  CBC: Recent Labs  Lab 02/11/21 0946 02/12/21 0537 02/13/21 0424  WBC  8.1 6.3 7.7  NEUTROABS 5.7 4.0  --   HGB 10.1* 9.4* 9.2*  HCT 30.0* 28.4* 28.2*  MCV 83.3 83.8 85.5  PLT 240 234 426   Basic Metabolic Panel: Recent Labs  Lab 02/11/21 0946 02/12/21 0537 02/13/21 0424  NA 127* 130* 132*  K 4.0 4.0 4.5  CL 94* 98 103  CO2 23 25 24   GLUCOSE 116* 100* 97  BUN 35* 35* 40*  CREATININE 2.28* 2.13* 2.15*  CALCIUM 8.3* 8.3* 8.3*  MG  --   --  2.5*    GFR: Estimated Creatinine Clearance: 32.6 mL/min (A) (by C-G formula based on SCr of 2.15 mg/dL (H)). Liver Function Tests: Recent Labs  Lab 02/11/21 0946 02/12/21 0537  AST 33 28  ALT 55* 43  ALKPHOS 111 97  BILITOT 1.1 0.7  PROT 6.9 6.1*  ALBUMIN 2.9* 2.5*   No results for input(s): LIPASE, AMYLASE in the last 168 hours. Recent Labs  Lab 02/11/21 0946  AMMONIA 11   Coagulation Profile: No results for input(s): INR, PROTIME in the last 168 hours. Cardiac Enzymes: No results for input(s): CKTOTAL, CKMB, CKMBINDEX, TROPONINI in the last 168 hours. BNP (last 3 results) No results for input(s): PROBNP in the last 8760 hours. HbA1C: No results for input(s): HGBA1C in the last 72 hours. CBG: No results for input(s): GLUCAP in the last 168 hours. Lipid Profile: No results for input(s): CHOL, HDL, LDLCALC, TRIG, CHOLHDL, LDLDIRECT in the last 72 hours. Thyroid Function Tests: Recent Labs    02/11/21 0946  TSH 1.388   Anemia Panel: No results for input(s): VITAMINB12, FOLATE, FERRITIN, TIBC, IRON, RETICCTPCT in the last 72 hours. Sepsis Labs: Recent Labs  Lab 02/11/21 1645 02/13/21 0424  PROCALCITON 1.29 0.67    Recent Results (from the past 240 hour(s))  Resp Panel by RT-PCR (Flu A&B, Covid) Nasopharyngeal Swab     Status: Abnormal   Collection Time: 02/11/21  9:46 AM   Specimen: Nasopharyngeal Swab; Nasopharyngeal(NP) swabs in vial transport medium  Result Value Ref Range Status   SARS Coronavirus 2 by RT PCR POSITIVE (A) NEGATIVE Final    Comment: RESULT CALLED TO, READ BACK BY AND VERIFIED WITH:  LAURA HERNANDEZ AT 1104 02/11/21 SDR (NOTE) SARS-CoV-2 target nucleic acids are DETECTED.  The SARS-CoV-2 RNA is generally detectable in upper respiratory specimens during the acute phase of infection. Positive results are indicative of the presence of the identified virus, but do not rule out bacterial infection or co-infection with other pathogens not detected  by the test. Clinical correlation with patient history and other diagnostic information is necessary to determine patient infection status. The expected result is Negative.  Fact Sheet for Patients: EntrepreneurPulse.com.au  Fact Sheet for Healthcare Providers: IncredibleEmployment.be  This test is not yet approved or cleared by the [place] FDA and  has been authorized for detection and/or diagnosis of SARS-CoV-2 by FDA under an Emergency Use Authorization (EUA).  This EUA will remain in effect (meaning this test can  be used) for the duration of  the COVID-19 declaration under Section 564(b)(1) of the Act, 21 U.S.C. section 360bbb-3(b)(1), unless the authorization is terminated or revoked sooner.     Influenza A by PCR NEGATIVE NEGATIVE Final   Influenza B by PCR NEGATIVE NEGATIVE Final    Comment: (NOTE) The Xpert Xpress SARS-CoV-2/FLU/RSV plus assay is intended as an aid in the diagnosis of influenza from Nasopharyngeal swab specimens and should not be used as a sole basis for treatment. Nasal  washings and aspirates are unacceptable for Xpert Xpress SARS-CoV-2/FLU/RSV testing.  Fact Sheet for Patients: EntrepreneurPulse.com.au  Fact Sheet for Healthcare Providers: IncredibleEmployment.be  This test is not yet approved or cleared by the [place] FDA and has been authorized for detection and/or diagnosis of SARS-CoV-2 by FDA under an Emergency Use Authorization (EUA). This EUA will remain in effect (meaning this test can be used) for the duration of the COVID-19 declaration under Section 564(b)(1) of the Act, 21 U.S.C. section 360bbb-3(b)(1), unless the authorization is terminated or revoked.  Performed at [facility], [address] [place], [place] [address]   Blood culture (routine x 2)     Status: None (Preliminary result)   Collection Time: 02/11/21  4:43 PM   Specimen:  BLOOD  Result Value Ref Range Status   Specimen Description BLOOD LEFT ANTECUBITAL  Final   Special Requests   Final    BOTTLES DRAWN AEROBIC AND ANAEROBIC Blood Culture adequate volume   Culture   Final    NO GROWTH 2 DAYS Performed at [facility], [address] [place], [place] [address]    Report Status PENDING  Incomplete  Blood culture (routine x 2)     Status: None (Preliminary result)   Collection Time: 02/11/21  4:44 PM   Specimen: BLOOD  Result Value Ref Range Status   Specimen Description BLOOD BLOOD LEFT HAND  Final   Special Requests   Final    BOTTLES DRAWN AEROBIC AND ANAEROBIC Blood Culture adequate volume   Culture   Final    NO GROWTH 2 DAYS Performed at [facility], [address], [place], [place] [address]    Report Status PENDING  Incomplete      Radiology Studies: No results found.   Scheduled Meds: . albuterol  2 puff Inhalation Q6H WA  . amLODipine  5 mg Oral Daily  . aspirin EC  81 mg Oral Daily  . atorvastatin  40 mg Oral Daily  . calcium carbonate  500 mg of elemental calcium Oral BID WC  . carvedilol  6.25 mg Oral BID WC  . clopidogrel  75 mg Oral Daily  . enoxaparin (LOVENOX) injection  0.5 mg/kg Subcutaneous Q24H  . feeding supplement  237 mL Oral TID BM  . fluticasone  2 spray Each Nare Daily  . fluticasone furoate-vilanterol  1 puff Inhalation Daily  . gabapentin  300 mg Oral TID  . ipratropium  2 spray Each Nare Daily  . isosorbide mononitrate  30 mg Oral Daily  . multivitamin with minerals  1 tablet Oral Daily  . QUEtiapine  50 mg Oral QHS  . umeclidinium bromide  1 puff Inhalation Daily   Continuous Infusions: . sodium chloride Stopped (02/12/21 1629)  . remdesivir 100 mg in NS 100 mL 100 mg (02/13/21 1108)     LOS: 1 day     Enzo Bi, MD [facility] If 7PM-7AM, please contact night-coverage 02/13/2021, 4:06 PM

## 2021-02-13 NOTE — TOC Progression Note (Signed)
Transition of Care [facility]) - Progression Note    Patient Details  Name: LON KLIPPEL MRN: [id number] Date of Birth: Apr 15, 1935  Transition of [facility]) CM/SW Contact  Truitt Merle, LCSW Phone Number: 02/13/2021, 5:29 PM  Clinical Narrative:    LCSW attempted to contact patient's legal guardian [place] at [phone]. No answer. Left HIPPA-friendly voicemail. TOC continuing to follow for discharge needs.        Expected Discharge Plan and Services                                                 Social Determinants of Health (SDOH) Interventions    Readmission Risk Interventions No flowsheet data found.

## 2021-02-14 LAB — BASIC METABOLIC PANEL
Anion gap: 8 (ref 5–15)
BUN: 45 mg/dL — ABNORMAL HIGH (ref 8–23)
CO2: 23 mmol/L (ref 22–32)
Calcium: 8.5 mg/dL — ABNORMAL LOW (ref 8.9–10.3)
Chloride: 103 mmol/L (ref 98–111)
Creatinine, Ser: 1.98 mg/dL — ABNORMAL HIGH (ref 0.61–1.24)
GFR, Estimated: 33 mL/min — ABNORMAL LOW (ref >60)
Glucose, Bld: 98 mg/dL (ref 70–99)
Potassium: 5.2 mmol/L — ABNORMAL HIGH (ref 3.5–5.1)
Sodium: 134 mmol/L — ABNORMAL LOW (ref 135–145)

## 2021-02-14 LAB — CBC
HCT: 28.8 % — ABNORMAL LOW (ref 39.0–52.0)
Hemoglobin: 9.7 g/dL — ABNORMAL LOW (ref 13.0–17.0)
MCH: 28.4 pg (ref 26.0–34.0)
MCHC: 33.7 g/dL (ref 30.0–36.0)
MCV: 84.5 fL (ref 80.0–100.0)
Platelets: 262 10*3/uL (ref 150–400)
RBC: 3.41 MIL/uL — ABNORMAL LOW (ref 4.22–5.81)
RDW: 15.3 % (ref 11.5–15.5)
WBC: 7.1 10*3/uL (ref 4.0–10.5)
nRBC: 0 % (ref 0.0–0.2)

## 2021-02-14 LAB — PROCALCITONIN: Procalcitonin: 0.46 ng/mL

## 2021-02-14 LAB — MAGNESIUM: Magnesium: 2.2 mg/dL (ref 1.7–2.4)

## 2021-02-14 LAB — C-REACTIVE PROTEIN: CRP: 10.9 mg/dL — ABNORMAL HIGH (ref <1.0)

## 2021-02-14 MED ORDER — SODIUM ZIRCONIUM CYCLOSILICATE 10 G PO PACK
10.0000 g | PACK | Freq: Once | ORAL | Status: AC
  Administered 2021-02-14: 10 g via ORAL
  Filled 2021-02-14: qty 1

## 2021-02-14 NOTE — Progress Notes (Signed)
PT Cancellation Note  Patient Details Name: [name] MRN: [id number] DOB: [birth year]   Cancelled Treatment:    Reason Eval/Treat Not Completed: Medical issues which prohibited therapy Pt noted to have elevated K+ levels (5.2) & exertional activity contraindicated at this time. Will f/u as able & as pt is medically appropriate to participate in tx.   Lavone Nian, PT, DPT 02/14/21, 1:50 PM    [name] 02/14/2021, 1:49 PM

## 2021-02-14 NOTE — NC FL2 (Signed)
[place] LEVEL OF CARE SCREENING TOOL     IDENTIFICATION  Patient Name: [name] Birthdate: [birth year] Sex: male Admission Date (Current Location): 02/11/2021  [place] and [place] Number:  Engineering geologist and Address:  [facility], [address] [place], [place] [address]      Provider Number: [id number]  Attending Physician Name and Address:  Fritzi Mandes, MD  Relative Name and Phone Number:       Current Level of Care: Hospital Recommended Level of Care: [place] Prior Approval Number:    Date Approved/Denied:   PASRR Number: [id number]  Discharge Plan: SNF    Current Diagnoses: Patient Active Problem List   Diagnosis Date Noted  . Weakness 02/11/2021  . COVID-19 02/11/2021  . Morbid obesity ([place]) 01/10/2021  . Right calcaneal fracture 01/10/2021  . PVD (peripheral vascular disease) ([place]) 01/10/2021  . Chronic systolic CHF (congestive heart failure) ([place]) 01/10/2021  . Hip fracture ([place]) 01/08/2021  . Primary osteoarthritis of left knee 01/06/2021  . Arthropathy of left knee 01/06/2021  . Chronic pain of left knee 01/06/2021  . Class 3 obesity with alveolar hypoventilation and body mass index (BMI) of 40.0 to 44.9 in adult ([place]) 12/23/2020  . Hip joint effusion (Left) 12/23/2020  . History of alcohol abuse 11/29/2020  . Elevated rheumatoid factor 11/29/2020  . Polyarthritis with positive rheumatoid factor ([place]) 11/17/2020  . Elevated uric acid in blood 11/17/2020  . CKD (chronic kidney disease), stage IV ([place]) 10/26/2020  . Proteinuria, unspecified 10/26/2020  . Elevated BUN 10/26/2020  . Elevated serum creatinine 10/26/2020  . Hypochloremia 10/26/2020  . Hypocalcemia 10/26/2020  . Hypoalbuminemia 10/26/2020  . Elevated alkaline phosphatase level 10/26/2020  . Elevated hemoglobin A1c 10/26/2020  . Elevated random blood glucose level 10/26/2020  . Anemia in stage 3b chronic kidney disease  ([place]) 10/26/2020  .  Klippel-Feil deformity at the C5-C7 levels 10/26/2020  . Carotid artery calcification, bilateral 10/26/2020  . History of 2.0 cm thyroid mass (Left lobe) 10/26/2020  . Bilateral renal cysts 10/26/2020  . Lumbosacral spondylosis 10/26/2020  . Osteoarthritis of facet joint of lumbar spine 10/26/2020  . DDD (degenerative disc disease), lumbosacral 10/26/2020  . History of total hip replacement (Right) 10/26/2020  . Osteoarthritis of hip (Left) 10/26/2020  . Diverticulosis of sigmoid colon 10/26/2020  . Abnormal MRI, lumbar spine (10/07/2020) 10/26/2020  . Lumbar central spinal stenosis with neurogenic claudication 10/26/2020  . Lumbar foraminal stenosis 10/26/2020  . Elevated C-reactive protein (CRP) 10/25/2020  . Elevated sed rate 10/25/2020  . Vitamin D deficiency 10/25/2020  . Chronic anticoagulation (Plavix) 09/20/2020  . Left hip pain 09/20/2020  . Unilateral post-traumatic osteoarthritis of hip (Left) 09/20/2020  . Chronic knee pain (2ry area of Pain) (Bilateral) 09/20/2020  . Secondary osteoarthritis of knee (Bilateral) 09/20/2020  . Osteoarthritis of knees (Bilateral) 09/20/2020  . Chronic constipation 09/20/2020  . Chronic shoulder pain (3ry area of Pain) (Bilateral) 09/20/2020  . Grade 1 (3 mm) anterolisthesis of lumbar spine (L4-5) (stable) 09/20/2020  . Chronic groin pain (Left) 09/20/2020  . Other intervertebral disc degeneration, lumbar region 09/20/2020  . Neurogenic pain 09/20/2020  . Chronic pain syndrome 09/18/2020  . Pharmacologic therapy 09/18/2020  . Disorder of skeletal system 09/18/2020  . Problems influencing health status 09/18/2020  . Lower extremity pain, bilateral 05/15/2018  . Elevated troponin I level 07/31/2017  . Hyperkalemia 05/28/2017  . Syncope 05/27/2017  . Meningioma ([place]) 01/05/2017  . GI bleed 12/01/2016  .  Alcohol abuse 10/30/2016  . COPD (chronic obstructive pulmonary disease) with chronic bronchitis ([place]) 10/04/2016   . Obstructive sleep apnea 10/04/2016  . Hyponatremia 08/31/2016  . Shortness of breath 02/11/2016  . Hyperlipidemia 12/03/2015  . Essential hypertension   . Coronary artery disease 01/19/2004    Orientation RESPIRATION BLADDER Height & Weight     Self,Situation,Place  Normal Incontinent,External catheter Weight: 126 kg Height:  5\' 7"  (170.2 cm)  BEHAVIORAL SYMPTOMS/MOOD NEUROLOGICAL BOWEL NUTRITION STATUS      Continent Diet (Heart Healthy)  AMBULATORY STATUS COMMUNICATION OF NEEDS Skin   Extensive Assist Verbally Normal                       Personal Care Assistance Level of Assistance              Functional Limitations Info             SPECIAL CARE FACTORS FREQUENCY  PT (By licensed PT),OT (By licensed OT)                    Contractures Contractures Info: Not present    Additional Factors Info  Code Status,Allergies Code Status Info: DNR Allergies Info: Chlorthalidone, Ace Inhibitors, Hydrochlorothiazide, Lisinopril, Losartan, Other, Benazepril-hydrochlorothiazide           Current Medications (02/14/2021):  This is the current hospital active medication list Current Facility-Administered Medications  Medication Dose Route Frequency Provider Last Rate Last Admin  . 0.9 %  sodium chloride infusion   Intravenous PRN Enzo Bi, MD   Stopped at 02/12/21 1629  . albuterol (VENTOLIN HFA) 108 (90 Base) MCG/ACT inhaler 2 puff  2 puff Inhalation Q6H WA Cox, Amy N, DO   2 puff at 02/14/21 1002  . amLODipine (NORVASC) tablet 5 mg  5 mg Oral Daily Cox, Amy N, DO   5 mg at 02/14/21 1002  . aspirin EC tablet 81 mg  81 mg Oral Daily Cox, Amy N, DO   81 mg at 02/14/21 1002  . atorvastatin (LIPITOR) tablet 40 mg  40 mg Oral Daily Cox, Amy N, DO   40 mg at 02/13/21 2131  . calcium carbonate (OS-CAL - dosed in mg of elemental calcium) tablet 500 mg of elemental calcium  500 mg of elemental calcium Oral BID WC Cox, Amy N, DO   500 mg of elemental calcium at  02/14/21 1001  . carvedilol (COREG) tablet 6.25 mg  6.25 mg Oral BID WC Cox, Amy N, DO   6.25 mg at 02/14/21 1002  . chlorpheniramine-HYDROcodone (TUSSIONEX) 10-8 MG/5ML suspension 5 mL  5 mL Oral Q12H PRN Cox, Amy N, DO      . clopidogrel (PLAVIX) tablet 75 mg  75 mg Oral Daily Cox, Amy N, DO   75 mg at 02/14/21 1002  . enoxaparin (LOVENOX) injection 62.5 mg  0.5 mg/kg Subcutaneous Q24H Oswald Hillock, RPH   62.5 mg at 02/13/21 2132  . feeding supplement (ENSURE ENLIVE / ENSURE PLUS) liquid 237 mL  237 mL Oral TID BM Cox, Amy N, DO   237 mL at 02/14/21 1004  . fluticasone (FLONASE) 50 MCG/ACT nasal spray 2 spray  2 spray Each Nare Daily Cox, Amy N, DO   2 spray at 02/14/21 1003  . fluticasone furoate-vilanterol (BREO ELLIPTA) 200-25 MCG/INH 1 puff  1 puff Inhalation Daily Oswald Hillock, RPH   1 puff at 02/14/21 1004  . gabapentin (NEURONTIN) capsule 300 mg  300 mg Oral  TID Cox, Amy N, DO   300 mg at 02/14/21 1002  . guaiFENesin-dextromethorphan (ROBITUSSIN DM) 100-10 MG/5ML syrup 10 mL  10 mL Oral Q4H PRN Cox, Amy N, DO      . HYDROcodone-acetaminophen (NORCO/VICODIN) 5-325 MG per tablet 1 tablet  1 tablet Oral BID PRN Enzo Bi, MD   1 tablet at 02/14/21 1002  . ipratropium (ATROVENT) 0.03 % nasal spray 2 spray  2 spray Each Nare Daily Cox, Amy N, DO   2 spray at 02/14/21 1002  . isosorbide mononitrate (IMDUR) 24 hr tablet 30 mg  30 mg Oral Daily Cox, Amy N, DO   30 mg at 02/14/21 1001  . multivitamin with minerals tablet 1 tablet  1 tablet Oral Daily Cox, Amy N, DO   1 tablet at 02/14/21 1001  . nitroGLYCERIN (NITROSTAT) SL tablet 0.4 mg  0.4 mg Sublingual Q5 Min x 3 PRN Cox, Amy N, DO      . ondansetron (ZOFRAN) tablet 4 mg  4 mg Oral Q6H PRN Cox, Amy N, DO       Or  . ondansetron (ZOFRAN) injection 4 mg  4 mg Intravenous Q6H PRN Cox, Amy N, DO      . QUEtiapine (SEROQUEL) tablet 50 mg  50 mg Oral QHS Cox, Amy N, DO   50 mg at 02/13/21 2131  . sodium zirconium cyclosilicate (LOKELMA) packet  10 g  10 g Oral Once Fritzi Mandes, MD      . umeclidinium bromide (INCRUSE ELLIPTA) 62.5 MCG/INH 1 puff  1 puff Inhalation Daily Oswald Hillock, RPH   1 puff at 02/14/21 1004     Discharge Medications: Please see discharge summary for a list of discharge medications.  Relevant Imaging Results:  Relevant Lab Results:   Additional Information SS #: [place]  Beverly Sessions, RN

## 2021-02-14 NOTE — Progress Notes (Signed)
[place] at [place] NAME: [name]    MR#:  [id number]  DATE OF BIRTH:  [birth year]  SUBJECTIVE:  patient helps me he is going nuts sitting here in the room alone. Answers all my questions appropriately has been calm and pleasant.   REVIEW OF SYSTEMS:   Review of Systems  Constitutional: Negative for chills, fever and weight loss.  HENT: Negative for ear discharge, ear pain and nosebleeds.   Eyes: Negative for blurred vision, pain and discharge.  Respiratory: Negative for sputum production, shortness of breath, wheezing and stridor.   Cardiovascular: Negative for chest pain, palpitations, orthopnea and PND.  Gastrointestinal: Negative for abdominal pain, diarrhea, nausea and vomiting.  Genitourinary: Negative for frequency and urgency.  Musculoskeletal: Negative for back pain and joint pain.  Neurological: Positive for weakness. Negative for sensory change, speech change and focal weakness.  Psychiatric/Behavioral: Negative for depression and hallucinations. The patient is not nervous/anxious.    Tolerating Diet:yes Tolerating PT: STR  DRUG ALLERGIES:   Allergies  Allergen Reactions   Chlorthalidone Other (See Comments)    Hyponatremia   Ace Inhibitors Cough   Hydrochlorothiazide    Lisinopril Other (See Comments)   Losartan    Other Other (See Comments)    "ANY BLOOD PRESSURE MEDICATIONS THAT I'VE TRIED" - PT. DOES NOT REMEMBER WHICH ONES "ANY BLOOD PRESSURE MEDICATIONS THAT I'VE TRIED"- PT. DOES NOT REMEMBER WHICH ONES- CONSTIPATION   Benazepril-Hydrochlorothiazide Other (See Comments)    Constipation    VITALS:  Blood pressure 102/62, pulse 68, temperature 98 F (36.7 C), temperature source Oral, resp. rate 16, height 5\' 7"  (1.702 m), weight 126 kg, SpO2 97 %.  PHYSICAL EXAMINATION:   Physical Exam  GENERAL:  86 y.o.-year-old patient lying in the bed with no acute distress. Obese--morbidly LUNGS: Normal  breath sounds bilaterally, no wheezing, rales, rhonchi. No use of accessory muscles of respiration.  CARDIOVASCULAR: S1, S2 normal. No murmurs, rubs, or gallops.  ABDOMEN: Soft, nontender, nondistended. Abdominal obesity  EXTREMITIES: No cyanosis, clubbing or edema b/l.    NEUROLOGIC: grossly nonfocal. Generalized weakness PSYCHIATRIC:  patient is alert and oriented x 3.  SKIN: No obvious rash, lesion, or ulcer.   LABORATORY PANEL:  CBC Recent Labs  Lab 02/14/21 0440  WBC 7.1  HGB 9.7*  HCT 28.8*  PLT 262    Chemistries  Recent Labs  Lab 02/12/21 0537 02/13/21 0424 02/14/21 0440  NA 130*   < > 134*  K 4.0   < > 5.2*  CL 98   < > 103  CO2 25   < > 23  GLUCOSE 100*   < > 98  BUN 35*   < > 45*  CREATININE 2.13*   < > 1.98*  CALCIUM 8.3*   < > 8.5*  MG  --    < > 2.2  AST 28  --   --   ALT 43  --   --   ALKPHOS 97  --   --   BILITOT 0.7  --   --    < > = values in this interval not displayed.   Cardiac Enzymes No results for input(s): TROPONINI in the last 168 hours. RADIOLOGY:  No results found. ASSESSMENT AND PLAN:   [name] a 86 y.o.malewith medical history significant forCAD status post DES in the right coronary artery in 2005, hypertension, non-insulin-dependent diabetes mellitus, hyperlipidemia, chronic diastolic/systolic heart failure, sleep apnea not on CPAP, morbid  obesity, previous tobacco abuse, previous history of alcohol abuse, CKD4, bipolar disorder with behavioral disturbances/agitation, peripheral arterial disease, mild to moderate aortic stenosis, presented from home via EMS for chief concern from daughter of facial drooping.   Reported facial droop, ruled out No facial droop was appreciated on physical exam, CT of the head was negative. --Monitor for focal neuro deficit.  No need for MRI brain this time.  generalized weakness  --recent hip fracture, reported poor cooperation at rehab.   Plan: --PT/OT rec SNF rehab, TOC contacted to  initiate search  COVID-19 infection --Not symptomatic, no hypoxia, CXR wnl. Plan: --completed  3 of 3 IV Remdesivir  Hypertension -takes home amlodipine 5 mg daily, Coreg 6.25 mg twice daily, isosorbide mononitrate 30 mg daily --cont home regimen  Insomnia -quetiapine 50 mg daily at bedtime  Hyperlipidemia -atorvastatin40   CAD -clopidogrel 75 mg daily, aspirin 81 mg daily, Coreg, atorvastatin,  Neuropathy -gabapentin 300 mg twice daily  COPD -trilogy Ellipta  OSA/morbid obesity -CPAP, questionable compliance -CPAP nightly ordered   DVT prophylaxis: Lovenox SQ Code Status: Full code  Family Communication:  spoke with patient [name] on the phone Level of care: Med-Surg Dispo:   The patient is from: home Anticipated d/c is to: SNF rehab Anticipated d/c date is: whenever bed available Patient currently is medically ready to d/c.   Awaiting rehab bed availability.       TOTAL TIME TAKING CARE OF THIS PATIENT: *20* minutes.  >50% time spent on counselling and coordination of care  Note: This dictation was prepared with Dragon dictation along with smaller phrase technology. Any transcriptional errors that result from this process are unintentional.  [name] M.D    [facility]   CC: Primary care physician; [name], MDPatient ID: [name], male   DOB: 17-Jul-1935, 86 y.o.   MRN: [id number]

## 2021-02-14 NOTE — TOC Initial Note (Signed)
Transition of Care [facility]) - Initial/Assessment Note    Patient Details  Name: [name] MRN: [id number] Date of Birth: 1935/03/16  Transition of Care [facility]) CM/SW Contact:    Beverly Sessions, RN Phone Number: 02/14/2021, 2:33 PM  Clinical Narrative:                 Patient admitted for covid.  Tested positive covid on 2/25  Patient A&O  x3  Listed that patient has legal guardian who is daughter [name].  No legal documentation on chart  Called and left VM x2.   Received call from daughter [name] [phone].  She states that her sister is not patient's legal guardian, she is his HPOA, but has been dealing with her own health issues.    Patient lives at home alone Has had 3 recent falls  PCP [name].  Baseline family transports to appointments, however currently is patient is max assist x2  PT has assessed patient and recommends SNF.  Daughter in agreement.  She was notified that due to patient being covid positive we would be limited to what facility could accept him  Due to covid isolation I reached out to patient by phone.  He is in agreement for SNF placement.  I assisted him in creating a password, and will provide it to his daughter [name] per his request  Fl2 sent for signature.  Bed search initiated  Expected Discharge Plan: [place] Barriers to Discharge: Continued Medical Work up   Patient Goals and CMS Choice        Expected Discharge Plan and Services Expected Discharge Plan: [place]                                              Prior Living Arrangements/Services   Lives with:: Self              Current home services: DME    Activities of Daily Living Home Assistive Devices/Equipment: Environmental consultant (specify type) ADL Screening (condition at time of admission) Patient's cognitive ability adequate to safely complete daily activities?: Yes Is the patient deaf or have difficulty hearing?: Yes Does the  patient have difficulty seeing, even when wearing glasses/contacts?: No Does the patient have difficulty concentrating, remembering, or making decisions?: Yes Patient able to express need for assistance with ADLs?: Yes Does the patient have difficulty dressing or bathing?: No Independently performs ADLs?: No Communication: Independent Dressing (OT): Needs assistance Is this a change from baseline?: Pre-admission baseline Grooming: Needs assistance Is this a change from baseline?: Pre-admission baseline Feeding: Independent Bathing: Needs assistance Is this a change from baseline?: Pre-admission baseline Toileting: Needs assistance Is this a change from baseline?: Pre-admission baseline In/Out Bed: Needs assistance Is this a change from baseline?: Pre-admission baseline Walks in Home: Needs assistance Is this a change from baseline?: Pre-admission baseline Does the patient have difficulty walking or climbing stairs?: Yes Weakness of Legs: Both Weakness of Arms/Hands: Both  Permission Sought/Granted                  Emotional Assessment       Orientation: : Oriented to Self,Oriented to Place,Oriented to Situation   Psych Involvement: No (comment)  Admission diagnosis:  Hyponatremia [E87.1] Weakness [R53.1] COVID [U07.1] COVID-19 [U07.1] Patient Active Problem List   Diagnosis Date Noted  . Weakness 02/11/2021  . COVID-19  02/11/2021  . Morbid obesity ([place]) 01/10/2021  . Right calcaneal fracture 01/10/2021  . PVD (peripheral vascular disease) ([place]) 01/10/2021  . Chronic systolic CHF (congestive heart failure) ([place]) 01/10/2021  . Hip fracture ([place]) 01/08/2021  . Primary osteoarthritis of left knee 01/06/2021  . Arthropathy of left knee 01/06/2021  . Chronic pain of left knee 01/06/2021  . Class 3 obesity with alveolar hypoventilation and body mass index (BMI) of 40.0 to 44.9 in adult ([place]) 12/23/2020  . Hip joint effusion (Left) 12/23/2020  . History of alcohol  abuse 11/29/2020  . Elevated rheumatoid factor 11/29/2020  . Polyarthritis with positive rheumatoid factor ([place]) 11/17/2020  . Elevated uric acid in blood 11/17/2020  . CKD (chronic kidney disease), stage IV ([place]) 10/26/2020  . Proteinuria, unspecified 10/26/2020  . Elevated BUN 10/26/2020  . Elevated serum creatinine 10/26/2020  . Hypochloremia 10/26/2020  . Hypocalcemia 10/26/2020  . Hypoalbuminemia 10/26/2020  . Elevated alkaline phosphatase level 10/26/2020  . Elevated hemoglobin A1c 10/26/2020  . Elevated random blood glucose level 10/26/2020  . Anemia in stage 3b chronic kidney disease ([place]) 10/26/2020  .  Klippel-Feil deformity at the C5-C7 levels 10/26/2020  . Carotid artery calcification, bilateral 10/26/2020  . History of 2.0 cm thyroid mass (Left lobe) 10/26/2020    Class: History of  . Bilateral renal cysts 10/26/2020  . Lumbosacral spondylosis 10/26/2020  . Osteoarthritis of facet joint of lumbar spine 10/26/2020  . DDD (degenerative disc disease), lumbosacral 10/26/2020  . History of total hip replacement (Right) 10/26/2020  . Osteoarthritis of hip (Left) 10/26/2020  . Diverticulosis of sigmoid colon 10/26/2020  . Abnormal MRI, lumbar spine (10/07/2020) 10/26/2020  . Lumbar central spinal stenosis with neurogenic claudication 10/26/2020  . Lumbar foraminal stenosis 10/26/2020  . Elevated C-reactive protein (CRP) 10/25/2020  . Elevated sed rate 10/25/2020  . Vitamin D deficiency 10/25/2020  . Chronic anticoagulation (Plavix) 09/20/2020  . Left hip pain 09/20/2020  . Unilateral post-traumatic osteoarthritis of hip (Left) 09/20/2020  . Chronic knee pain (2ry area of Pain) (Bilateral) 09/20/2020  . Secondary osteoarthritis of knee (Bilateral) 09/20/2020  . Osteoarthritis of knees (Bilateral) 09/20/2020  . Chronic constipation 09/20/2020  . Chronic shoulder pain (3ry area of Pain) (Bilateral) 09/20/2020  . Grade 1 (3 mm) anterolisthesis of lumbar spine (L4-5) (stable)  09/20/2020  . Chronic groin pain (Left) 09/20/2020  . Other intervertebral disc degeneration, lumbar region 09/20/2020  . Neurogenic pain 09/20/2020  . Chronic pain syndrome 09/18/2020  . Pharmacologic therapy 09/18/2020  . Disorder of skeletal system 09/18/2020  . Problems influencing health status 09/18/2020  . Lower extremity pain, bilateral 05/15/2018  . Elevated troponin I level 07/31/2017  . Hyperkalemia 05/28/2017  . Syncope 05/27/2017  . Meningioma ([place]) 01/05/2017  . GI bleed 12/01/2016  . Alcohol abuse 10/30/2016  . COPD (chronic obstructive pulmonary disease) with chronic bronchitis ([place]) 10/04/2016  . Obstructive sleep apnea 10/04/2016  . Hyponatremia 08/31/2016  . Shortness of breath 02/11/2016  . Hyperlipidemia 12/03/2015  . Essential hypertension   . Coronary artery disease 01/19/2004   PCP:  Marguerita Merles, MD Pharmacy:   [facility], [place] [address] [place] [address] Phone: [phone] Fax: [phone]     Social Determinants of Health (SDOH) Interventions    Readmission Risk Interventions No flowsheet data found.

## 2021-02-14 NOTE — TOC Progression Note (Signed)
Transition of Care [facility]) - Progression Note    Patient Details  Name: [name] MRN: [id number] Date of Birth: 18-Feb-1935  Transition of Care [facility]) CM/SW [place], RN Phone Number: 02/14/2021, 4:19 PM  Clinical Narrative:     Damaris Schooner to Wray Kearns @ [facility] for possible SNF admission. States she would review patient after 10 day COVID quarantine. No COVID bed available at this time.    Expected Discharge Plan: Skilled Nursing Facility Barriers to Discharge: Continued Medical Work up  Expected Discharge Plan and Services Expected Discharge Plan: [place]                                               Social Determinants of Health (SDOH) Interventions    Readmission Risk Interventions No flowsheet data found.

## 2021-02-15 LAB — BASIC METABOLIC PANEL
Anion gap: 6 (ref 5–15)
BUN: 46 mg/dL — ABNORMAL HIGH (ref 8–23)
CO2: 25 mmol/L (ref 22–32)
Calcium: 8.4 mg/dL — ABNORMAL LOW (ref 8.9–10.3)
Chloride: 103 mmol/L (ref 98–111)
Creatinine, Ser: 2.18 mg/dL — ABNORMAL HIGH (ref 0.61–1.24)
GFR, Estimated: 29 mL/min — ABNORMAL LOW (ref >60)
Glucose, Bld: 91 mg/dL (ref 70–99)
Potassium: 4.8 mmol/L (ref 3.5–5.1)
Sodium: 134 mmol/L — ABNORMAL LOW (ref 135–145)

## 2021-02-15 LAB — CBC
HCT: 28.2 % — ABNORMAL LOW (ref 39.0–52.0)
Hemoglobin: 9.5 g/dL — ABNORMAL LOW (ref 13.0–17.0)
MCH: 28.6 pg (ref 26.0–34.0)
MCHC: 33.7 g/dL (ref 30.0–36.0)
MCV: 84.9 fL (ref 80.0–100.0)
Platelets: 268 10*3/uL (ref 150–400)
RBC: 3.32 MIL/uL — ABNORMAL LOW (ref 4.22–5.81)
RDW: 15.6 % — ABNORMAL HIGH (ref 11.5–15.5)
WBC: 6.2 10*3/uL (ref 4.0–10.5)
nRBC: 0 % (ref 0.0–0.2)

## 2021-02-15 LAB — C-REACTIVE PROTEIN: CRP: 7.8 mg/dL — ABNORMAL HIGH (ref <1.0)

## 2021-02-15 LAB — PROCALCITONIN: Procalcitonin: 0.33 ng/mL

## 2021-02-15 LAB — MAGNESIUM: Magnesium: 2 mg/dL (ref 1.7–2.4)

## 2021-02-15 NOTE — Progress Notes (Signed)
Physical Therapy Treatment Patient Details Name: [name] MRN: [id number] DOB: [birth year] Today's Date: 02/15/2021    History of Present Illness Pt is an 86 y/o M admitted from home on 02/11/21 with c/c of facial droop.  No facial droop was appreciated on physical exam, CT of the head was negative. Pt tested (+) for COVID. PMH: CAD s/p DES in R coronary artery 2005, HTN, NIDDM, HLD, chronic diastolic/systolic heart failure, sleep apnea not on CPAP, morbid obesity, tobacco abuse, alcohol abuse, CKD4, bipolar disorder with behavioral disturbances/agitation, PAD, mild to moderate aortic stenosis, alcoholism, asthma, DJD    PT Comments    Pt remains hesitant to do a lot secondary to persistent L hip pain, but was willing to participate and pleasant t/o the session despite some L hip aggravation with activity.  He did well with R LE exercises and despite clearly having pain and needing frequent rest breaks/encouragement he did relatively well with L side.  Pt did not do any formal ambulation (2/2 L hip pain, leg buckling, etc) but did better than previous sessions.   Follow Up Recommendations  SNF;Supervision for mobility/OOB     Equipment Recommendations   (TBD at next venue of care)    Recommendations for Other Services       Precautions / Restrictions Precautions Precautions: Fall Restrictions Weight Bearing Restrictions: No    Mobility  Bed Mobility Overal bed mobility: Needs Assistance Bed Mobility: Supine to Sit     Supine to sit: Min assist     General bed mobility comments: Pt did well with getting to EOB, needed only light assist to fully transition to sitting    Transfers Overall transfer level: Needs assistance Equipment used: Rolling walker (2 wheeled) Transfers: Sit to/from Stand Sit to Stand: Min assist         General transfer comment: Elevated bed 1-2", min assist to keep hips shifting forward and up.  Unsuccessful on first 2 attempts with light  assist.  Ambulation/Gait             General Gait Details: Deferred ambulation 2/2 L hip pain/stability and pt confidence.  Pt did manage to take ~4 side steps along EOB with heavy walker reliance - did show ability to take some weight through L   Stairs             Wheelchair Mobility    Modified Rankin (Stroke Patients Only)       Balance Overall balance assessment: Needs assistance Sitting-balance support: Feet supported Sitting balance-Leahy Scale: Fair     Standing balance support: Bilateral upper extremity supported;During functional activity Standing balance-Leahy Scale: Poor Standing balance comment: Pt did not have overt L LE buckling today, but clearly hesitant to put full weight through it, highly reliant on UEs/walker during standing                            Cognition Arousal/Alertness: Awake/alert Behavior During Therapy: WFL for tasks assessed/performed Overall Cognitive Status: Within Functional Limits for tasks assessed                                 General Comments: Pt calm and appropraite this date.  Eager to work with PT despite pain with most L hip movements.      Exercises General Exercises - Lower Extremity Ankle Circles/Pumps: AROM;10 reps Short Arc Quad: Strengthening;10 reps;AROM Heel Slides:  AAROM;5 reps;Strengthening (resisted on R, AAROM on L; resisted R>L leg ext) Hip ABduction/ADduction: Strengthening;AROM;10 reps (resisted on R, AROM (+ pain) on L`)    General Comments        Pertinent Vitals/Pain Pain Assessment: 0-10 Pain Score: 4  Pain Location: L hip    Home Living                      Prior Function            PT Goals (current goals can now be found in the care plan section) Progress towards PT goals: Progressing toward goals    Frequency    Min 2X/week      PT Plan Current plan remains appropriate    Co-evaluation              AM-PAC PT "6 Clicks"  Mobility   Outcome Measure  Help needed turning from your back to your side while in a flat bed without using bedrails?: A Little Help needed moving from lying on your back to sitting on the side of a flat bed without using bedrails?: A Lot Help needed moving to and from a bed to a chair (including a wheelchair)?: A Lot Help needed standing up from a chair using your arms (e.g., wheelchair or bedside chair)?: A Lot Help needed to walk in hospital room?: Total Help needed climbing 3-5 steps with a railing? : Total 6 Click Score: 11    End of Session Equipment Utilized During Treatment: Gait belt Activity Tolerance: Treatment limited secondary to agitation Patient left: in chair;with call bell/phone within reach;with chair alarm set Nurse Communication: Mobility status PT Visit Diagnosis: Unsteadiness on feet (R26.81);Muscle weakness (generalized) (M62.81);Difficulty in walking, not elsewhere classified (R26.2)     Time: 2353-6144 PT Time Calculation (min) (ACUTE ONLY): 27 min  Charges:  $Therapeutic Exercise: 8-22 mins $Therapeutic Activity: 8-22 mins                     Kreg Shropshire, DPT 02/15/2021, 5:16 PM

## 2021-02-15 NOTE — Plan of Care (Signed)

## 2021-02-15 NOTE — Progress Notes (Signed)
[facility] at [place] NAME: [name]    MR#:  [id number]  DATE OF BIRTH:  January 27, 1935  SUBJECTIVE:  No new complaints  REVIEW OF SYSTEMS:   Review of Systems  Constitutional: Negative for chills, fever and weight loss.  HENT: Negative for ear discharge, ear pain and nosebleeds.   Eyes: Negative for blurred vision, pain and discharge.  Respiratory: Negative for sputum production, shortness of breath, wheezing and stridor.   Cardiovascular: Negative for chest pain, palpitations, orthopnea and PND.  Gastrointestinal: Negative for abdominal pain, diarrhea, nausea and vomiting.  Genitourinary: Negative for frequency and urgency.  Musculoskeletal: Negative for back pain and joint pain.  Neurological: Positive for weakness. Negative for sensory change, speech change and focal weakness.  Psychiatric/Behavioral: Negative for depression and hallucinations. The patient is not nervous/anxious.    Tolerating Diet:yes Tolerating PT: STR  DRUG ALLERGIES:   Allergies  Allergen Reactions  . Chlorthalidone Other (See Comments)    Hyponatremia  . Ace Inhibitors Cough  . Hydrochlorothiazide   . Lisinopril Other (See Comments)  . Losartan   . Other Other (See Comments)    "ANY BLOOD PRESSURE MEDICATIONS THAT I'VE TRIED" - PT. DOES NOT REMEMBER WHICH ONES "ANY BLOOD PRESSURE MEDICATIONS THAT I'VE TRIED"- PT. DOES NOT REMEMBER WHICH ONES- CONSTIPATION  . Benazepril-Hydrochlorothiazide Other (See Comments)    Constipation    VITALS:  Blood pressure (!) 115/52, pulse 65, temperature 98.2 F (36.8 C), temperature source Oral, resp. rate 20, height 5\' 7"  (1.702 m), weight 126 kg, SpO2 97 %.  PHYSICAL EXAMINATION:   Physical Exam  GENERAL:  86 y.o.-year-old patient lying in the bed with no acute distress. Obese--morbidly LUNGS: Normal breath sounds bilaterally, no wheezing, rales, rhonchi. No use of accessory muscles of respiration.   CARDIOVASCULAR: S1, S2 normal. No murmurs, rubs, or gallops.  ABDOMEN: Soft, nontender, nondistended. Abdominal obesity  EXTREMITIES: No cyanosis, clubbing or edema b/l.    NEUROLOGIC: grossly nonfocal. Generalized weakness PSYCHIATRIC:  patient is alert and oriented x 3.  SKIN: No obvious rash, lesion, or ulcer.   LABORATORY PANEL:  CBC Recent Labs  Lab 02/15/21 0511  WBC 6.2  HGB 9.5*  HCT 28.2*  PLT 268    Chemistries  Recent Labs  Lab 02/12/21 0537 02/13/21 0424 02/15/21 0511  NA 130*   < > 134*  K 4.0   < > 4.8  CL 98   < > 103  CO2 25   < > 25  GLUCOSE 100*   < > 91  BUN 35*   < > 46*  CREATININE 2.13*   < > 2.18*  CALCIUM 8.3*   < > 8.4*  MG  --    < > 2.0  AST 28  --   --   ALT 43  --   --   ALKPHOS 97  --   --   BILITOT 0.7  --   --    < > = values in this interval not displayed.   Cardiac Enzymes No results for input(s): TROPONINI in the last 168 hours. RADIOLOGY:  No results found. ASSESSMENT AND PLAN:   [name] a 86 y.o.malewith medical history significant forCAD status post DES in the right coronary artery in 2005, hypertension, non-insulin-dependent diabetes mellitus, hyperlipidemia, chronic diastolic/systolic heart failure, sleep apnea not on CPAP, morbid obesity, previous tobacco abuse, previous history of alcohol abuse, CKD4, bipolar disorder with behavioral disturbances/agitation, peripheral arterial disease, mild to  moderate aortic stenosis, presented from home via EMS for chief concern from daughter of facial drooping.   Reported facial droop, ruled out No facial droop was appreciated on physical exam, CT of the head was negative.  generalized weakness  --recent hip fracture, reported poor cooperation at rehab.   Plan: --PT/OT rec SNF rehab, TOC contacted to initiate search  COVID-19 infection --Not symptomatic, no hypoxia, CXR wnl. Plan: --completed  3 of 3 IV Remdesivir  Hypertension -takes home amlodipine 5 mg  daily, Coreg 6.25 mg twice daily, isosorbide mononitrate 30 mg daily --cont home regimen  Insomnia -quetiapine  at bedtime  Hyperlipidemia -atorvastatin   CAD -clopidogrel, aspirin, Coreg, atorvastatin,  Neuropathy -gabapentin 300 mg twice daily  COPD -trilogy and Breo- Ellipta  OSA/morbid obesity -CPAP, questionable compliance -CPAP nightly ordered   DVT prophylaxis: Lovenox SQ Code Status: Full code  Family Communication:  spoke with patient [name] on the phone Level of care: Med-Surg Dispo:   The patient is from: home Anticipated d/c is to: SNF rehab Anticipated d/c date is: whenever bed available Patient currently is medically ready to d/c.   Patient has accepted bed at [facility]. Awaiting insurance authorization.       TOTAL TIME TAKING CARE OF THIS PATIENT: *20* minutes.  >50% time spent on counselling and coordination of care  Note: This dictation was prepared with Dragon dictation along with smaller phrase technology. Any transcriptional errors that result from this process are unintentional.  Fritzi Mandes M.D    [facility]   CC: Primary care physician; Marguerita Merles, MDPatient ID: [name], male   DOB: [birth year], 86 y.o.   MRN: [id number]

## 2021-02-15 NOTE — TOC Progression Note (Addendum)
Transition of Care [facility]) - Progression Note    Patient Details  Name: [name] MRN: [id number] Date of Birth: 02-15-1935  [facility] CM/SW Contact  Beverly Sessions, RN Phone Number: 02/15/2021, 10:37 AM  Clinical Narrative:    Bed offers presented to patient and daughter [name] on 3 way call.  Accepted bed at [place] [facility] notified and accepted in [place] with [place] at [facility] to start [place] for SNF and EMS   Update:  [facility] requesting additional therapy notes.  PT and MD notified   Expected Discharge Plan: [place] Barriers to Discharge: Continued Medical Work up  Expected Discharge Plan and Services Expected Discharge Plan: [place]                                               Social Determinants of Health (SDOH) Interventions    Readmission Risk Interventions No flowsheet data found.

## 2021-02-16 DIAGNOSIS — R531 Weakness: Secondary | ICD-10-CM

## 2021-02-16 DIAGNOSIS — I25119 Atherosclerotic heart disease of native coronary artery with unspecified angina pectoris: Secondary | ICD-10-CM

## 2021-02-16 DIAGNOSIS — Z6841 Body Mass Index (BMI) 40.0 and over, adult: Secondary | ICD-10-CM

## 2021-02-16 DIAGNOSIS — F1010 Alcohol abuse, uncomplicated: Secondary | ICD-10-CM

## 2021-02-16 DIAGNOSIS — I10 Essential (primary) hypertension: Secondary | ICD-10-CM

## 2021-02-16 DIAGNOSIS — J449 Chronic obstructive pulmonary disease, unspecified: Secondary | ICD-10-CM

## 2021-02-16 DIAGNOSIS — I5042 Chronic combined systolic (congestive) and diastolic (congestive) heart failure: Secondary | ICD-10-CM

## 2021-02-16 DIAGNOSIS — G4733 Obstructive sleep apnea (adult) (pediatric): Secondary | ICD-10-CM

## 2021-02-16 DIAGNOSIS — E662 Morbid (severe) obesity with alveolar hypoventilation: Secondary | ICD-10-CM

## 2021-02-16 DIAGNOSIS — N184 Chronic kidney disease, stage 4 (severe): Secondary | ICD-10-CM

## 2021-02-16 LAB — BASIC METABOLIC PANEL
Anion gap: 7 (ref 5–15)
BUN: 48 mg/dL — ABNORMAL HIGH (ref 8–23)
CO2: 25 mmol/L (ref 22–32)
Calcium: 8.4 mg/dL — ABNORMAL LOW (ref 8.9–10.3)
Chloride: 103 mmol/L (ref 98–111)
Creatinine, Ser: 2.16 mg/dL — ABNORMAL HIGH (ref 0.61–1.24)
GFR, Estimated: 29 mL/min — ABNORMAL LOW (ref >60)
Glucose, Bld: 92 mg/dL (ref 70–99)
Potassium: 4.8 mmol/L (ref 3.5–5.1)
Sodium: 135 mmol/L (ref 135–145)

## 2021-02-16 LAB — CULTURE, BLOOD (ROUTINE X 2)
Culture: NO GROWTH
Culture: NO GROWTH
Special Requests: ADEQUATE
Special Requests: ADEQUATE

## 2021-02-16 LAB — CBC
HCT: 28.1 % — ABNORMAL LOW (ref 39.0–52.0)
Hemoglobin: 9.1 g/dL — ABNORMAL LOW (ref 13.0–17.0)
MCH: 27.7 pg (ref 26.0–34.0)
MCHC: 32.4 g/dL (ref 30.0–36.0)
MCV: 85.7 fL (ref 80.0–100.0)
Platelets: 269 10*3/uL (ref 150–400)
RBC: 3.28 MIL/uL — ABNORMAL LOW (ref 4.22–5.81)
RDW: 15.5 % (ref 11.5–15.5)
WBC: 5.9 10*3/uL (ref 4.0–10.5)
nRBC: 0 % (ref 0.0–0.2)

## 2021-02-16 LAB — C-REACTIVE PROTEIN: CRP: 5.7 mg/dL — ABNORMAL HIGH (ref <1.0)

## 2021-02-16 LAB — MAGNESIUM: Magnesium: 2 mg/dL (ref 1.7–2.4)

## 2021-02-16 MED ORDER — FOLIC ACID 1 MG PO TABS
1.0000 mg | ORAL_TABLET | Freq: Every day | ORAL | Status: DC
  Administered 2021-02-16 – 2021-02-17 (×2): 1 mg via ORAL
  Filled 2021-02-16 (×2): qty 1

## 2021-02-16 MED ORDER — THIAMINE HCL 100 MG PO TABS
100.0000 mg | ORAL_TABLET | Freq: Every day | ORAL | Status: DC
  Administered 2021-02-16 – 2021-02-17 (×2): 100 mg via ORAL
  Filled 2021-02-16 (×2): qty 1

## 2021-02-16 MED ORDER — FUROSEMIDE 40 MG PO TABS
40.0000 mg | ORAL_TABLET | Freq: Every day | ORAL | Status: DC
  Administered 2021-02-16 – 2021-02-17 (×2): 40 mg via ORAL
  Filled 2021-02-16 (×2): qty 1

## 2021-02-16 MED ORDER — ACETAMINOPHEN 500 MG PO TABS
1000.0000 mg | ORAL_TABLET | Freq: Three times a day (TID) | ORAL | Status: DC
  Administered 2021-02-16 – 2021-02-17 (×4): 1000 mg via ORAL
  Filled 2021-02-16 (×4): qty 2

## 2021-02-16 MED ORDER — OXYCODONE HCL 5 MG PO TABS
5.0000 mg | ORAL_TABLET | Freq: Three times a day (TID) | ORAL | Status: DC | PRN
  Administered 2021-02-17 (×2): 5 mg via ORAL
  Filled 2021-02-16 (×2): qty 1

## 2021-02-16 NOTE — Progress Notes (Signed)
Physical Therapy Treatment Patient Details Name: [name] MRN: [id number] DOB: 1935-05-27 Today's Date: 02/16/2021    History of Present Illness Pt is an 86 y/o M admitted from home on 02/11/21 with c/c of facial droop.  No facial droop was appreciated on physical exam, CT of the head was negative. Pt tested (+) for COVID. PMH: CAD s/p DES in R coronary artery 2005, HTN, NIDDM, HLD, chronic diastolic/systolic heart failure, sleep apnea not on CPAP, morbid obesity, tobacco abuse, alcohol abuse, CKD4, bipolar disorder with behavioral disturbances/agitation, PAD, mild to moderate aortic stenosis, alcoholism, asthma, DJD    PT Comments    Pt was supine in bed with HOB elevated ~ 25 degrees. He agrees to PT session and was cooperative throughout. Pt asked several times." Why are my legs so weak?" Author discussed lack of OOB activity and ms atrophy with sedentary life style. He states understanding and agrees to OOB activity. Required assistance to exit L side of bed, stand, and ambulate. Was able to ambulate 2 x 30 ft with RW + min assist. Does endorse severe hip pain but overall tolerated well. Pain resolved once out of wt bearing. Pt is agreeable to DC to SNF. He will greatly benefit from SNF to address deficits while improving strength and safety with ADLs. Acute PT will continue to follow and progress as able per POC.     Follow Up Recommendations  SNF;Supervision for mobility/OOB     Equipment Recommendations  Other (comment) (defer to next level of care)    Recommendations for Other Services       Precautions / Restrictions Precautions Precautions: Fall Precaution Comments: LLE trochanteric precautions (no active hip abduction) Restrictions Weight Bearing Restrictions: No    Mobility  Bed Mobility Overal bed mobility: Needs Assistance Bed Mobility: Supine to Sit     Supine to sit: Min assist     General bed mobility comments: increased tie to perform. pt slightly impulsive  at times however was able to achieve EOB short sit with min assist only. HOB slightly elevated with pt using bedrail    Transfers Overall transfer level: Needs assistance Equipment used: Rolling walker (2 wheeled) Transfers: Sit to/from Stand Sit to Stand: Min assist;Mod assist         General transfer comment: slightly more assistance required to achieve standing EOB however pt did stand 3 x throughout session  Ambulation/Gait Ambulation/Gait assistance: Min assist Gait Distance (Feet): 30 Feet Assistive device: Rolling walker (2 wheeled) Gait Pattern/deviations: Step-to pattern;Trunk flexed;Antalgic (hip pain) Gait velocity: decrease   General Gait Details: Pt was able to ambulate 30 ft 2 x with RW       Balance Overall balance assessment: Needs assistance Sitting-balance support: Feet supported Sitting balance-Leahy Scale: Fair     Standing balance support: Bilateral upper extremity supported;During functional activity Standing balance-Leahy Scale: Poor Standing balance comment: reliant on BUE support for standing balance       Cognition Arousal/Alertness: Lethargic Behavior During Therapy: WFL for tasks assessed/performed Overall Cognitive Status: Within Functional Limits for tasks assessed      General Comments: Pt was long sitting in bed upon arriving. He is awake but keeps eyes closed alot. Was able to appropriately answer simple questions and is agreeable to rehab at DC.             Pertinent Vitals/Pain Pain Assessment: 0-10 Pain Score: 7  Faces Pain Scale: Hurts even more Pain Location: L hip Pain Descriptors / Indicators: Burning;Discomfort;Nagging Pain Intervention(s): Limited  activity within patient's tolerance;Monitored during session;Premedicated before session;Repositioned           PT Goals (current goals can now be found in the care plan section) Acute Rehab PT Goals Patient Stated Goal: get stronger so I can walk by myself  again Progress towards PT goals: Progressing toward goals    Frequency    Min 2X/week      PT Plan Current plan remains appropriate    Co-evaluation     PT goals addressed during session: Mobility/safety with mobility;Balance;Proper use of DME;Strengthening/ROM        AM-PAC PT "6 Clicks" Mobility   Outcome Measure  Help needed turning from your back to your side while in a flat bed without using bedrails?: A Little Help needed moving from lying on your back to sitting on the side of a flat bed without using bedrails?: A Lot Help needed moving to and from a bed to a chair (including a wheelchair)?: A Lot Help needed standing up from a chair using your arms (e.g., wheelchair or bedside chair)?: A Lot Help needed to walk in hospital room?: A Lot Help needed climbing 3-5 steps with a railing? : A Lot 6 Click Score: 13    End of Session Equipment Utilized During Treatment: Gait belt Activity Tolerance: Patient tolerated treatment well Patient left: in chair;with call bell/phone within reach;with chair alarm set Nurse Communication: Mobility status PT Visit Diagnosis: Unsteadiness on feet (R26.81);Muscle weakness (generalized) (M62.81);Difficulty in walking, not elsewhere classified (R26.2)     Time: 5176-1607 PT Time Calculation (min) (ACUTE ONLY): 13 min  Charges:  $Gait Training: 8-22 mins                     Julaine Fusi PTA 02/16/21, 3:15 PM

## 2021-02-16 NOTE — Progress Notes (Signed)
PROGRESS NOTE  [name] ION:[id number] DOB: 1935/01/08   PCP: Marguerita Merles, MD  Patient is from: Home  DOA: 02/11/2021 LOS: 4  Chief complaints: Facial droop, dry cough, chest congestion, wheeze  Brief Narrative / Interim history: 86 year old M with PMH of CAD/DES to the right CEA in 2005, NIDDM-2, HTN, HLD, combined CHF, OSA not on CPAP, CKD-4, PAD, bipolar disorder and alcohol abuse brought to ED by EMS with concern for facial droop pain per patient's daughter although not noted by EMS or previous providers.  Patient tested positive for COVID-19 infection.  He had no significant symptoms or radiologic evidence.  He was treated with remdesivir for 3 days.  Therapy recommended SNF.  Subjective: Seen and examined earlier this morning.  No major events overnight of this morning.  He endorses shortness of breath, cough, chest pain and left hip pain although he does not appear to be in distress.  He is oriented x2.  Follows command.  Objective: Vitals:   02/15/21 2355 02/16/21 0525 02/16/21 0830 02/16/21 1151  BP:  (!) 134/59 (!) 147/56 (!) 108/57  Pulse:  (!) 58 68 62  Resp:  18 17 20   Temp:  97.6 F (36.4 C) 97.8 F (36.6 C) 97.8 F (36.6 C)  TempSrc:  Oral Oral Oral  SpO2: 94% 95% 96% 95%  Weight:      Height:        Intake/Output Summary (Last 24 hours) at 02/16/2021 1418 Last data filed at 02/16/2021 1149 Gross per 24 hour  Intake 0 ml  Output 1100 ml  Net -1100 ml   Filed Weights   02/11/21 0936  Weight: 126 kg    Examination:  GENERAL: No apparent distress.  Nontoxic. HEENT: MMM.  Vision and hearing grossly intact.  NECK: Supple.  No apparent JVD.  RESP: On RA.  No IWOB.  Fair aeration bilaterally. CVS:  RRR. Heart sounds normal.  ABD/GI/GU: BS+. Abd soft, NTND.  MSK/EXT:  Moves extremities. No apparent deformity. No edema.  SKIN: no apparent skin lesion or wound NEURO: Awake and alert.  Oriented to self and place.  No apparent focal neuro deficit but  generalized weakness. PSYCH: Calm. Normal affect.  Procedures:  None  Microbiology summarized: 02/11/2021-COVID-19 PCR positive.  Assessment & Plan: Subjective facial droop: Reported by patient's daughter but not noted on exam by EMS providers so far.  CT head without contrast without acute finding.  No focal neuro deficits on exam.  Generalized weakness/ambulatory dysfunction after recent left hip fracture -Scheduled Tylenol with as needed oxycodone for pain. -Continue home gabapentin. -Continue PT/OT  COVID-19 infection-reports shortness of breath, chest pain and dry cough but no hypoxia or radiologic evidence of infection.  No respiratory distress on exam.  Completed 3 days of IV remdesivir -Supportive care with incentive spirometry, OOB/PT/OT  Essential hypertension: Normotensive -Continue home amlodipine, Coreg, Imdur  Insomnia/bipolar disorder: Stable -quetiapine  at bedtime  Hyperlipidemia -atorvastatin   CAD s/p DES to RCA in 2005: Stable -Continue clopidogrel, aspirin, Coreg, atorvastatin,  Chronic combined CHF: TTE in 03/2019 with LVEF of 45 to 50%, severe LVH with unknown DD.  He is on p.o. Lasix at home.  Does not appear fluid overloaded. -Resume home Lasix -GDMT-Coreg and Imdur as above -Monitor fluid status  Neuropathy -gabapentin 300 mg twice daily  Chronic COPD -trilogy and Breo- Ellipta  OSA not compliant with CPAP -Encourage nightly CPAP.  Chronic alcoholism-seems to be main issue.  Per patient's daughter, seems to be medicating his  left hip pain with alcohol.  No signs of withdrawal here. -Recommend alcohol rehab in the future once physically optimized -Multivitamin, thiamine and folic acid  Morbid obesity Body mass index is 43.51 kg/m.         DVT prophylaxis:  Place TED hose Start: 02/11/21 1421  Code Status: Full code Family Communication: Updated patient's daughter over the phone. Level of care: Med-Surg Status is:  Inpatient  Remains inpatient appropriate because:Unsafe d/c plan   Dispo: The patient is from: Home              Anticipated d/c is to: SNF              Patient currently is medically stable to d/c.   Difficult to place patient No       Consultants:  None   Sch Meds:  Scheduled Meds: . albuterol  2 puff Inhalation Q6H WA  . amLODipine  5 mg Oral Daily  . aspirin EC  81 mg Oral Daily  . atorvastatin  40 mg Oral Daily  . calcium carbonate  500 mg of elemental calcium Oral BID WC  . carvedilol  6.25 mg Oral BID WC  . clopidogrel  75 mg Oral Daily  . enoxaparin (LOVENOX) injection  0.5 mg/kg Subcutaneous Q24H  . feeding supplement  237 mL Oral TID BM  . fluticasone  2 spray Each Nare Daily  . fluticasone furoate-vilanterol  1 puff Inhalation Daily  . gabapentin  300 mg Oral TID  . ipratropium  2 spray Each Nare Daily  . isosorbide mononitrate  30 mg Oral Daily  . multivitamin with minerals  1 tablet Oral Daily  . QUEtiapine  50 mg Oral QHS  . umeclidinium bromide  1 puff Inhalation Daily   Continuous Infusions: . sodium chloride Stopped (02/12/21 1629)   PRN Meds:.sodium chloride, chlorpheniramine-HYDROcodone, guaiFENesin-dextromethorphan, HYDROcodone-acetaminophen, nitroGLYCERIN, ondansetron **OR** ondansetron (ZOFRAN) IV  Antimicrobials: Anti-infectives (From admission, onward)   Start     Dose/Rate Route Frequency Ordered Stop   02/12/21 1000  remdesivir 100 mg in sodium chloride 0.9 % 100 mL IVPB  Status:  Discontinued       "Followed by" Linked Group Details   100 mg 200 mL/hr over 30 Minutes Intravenous Daily 02/11/21 1423 02/13/21 1639   02/11/21 1500  remdesivir 200 mg in sodium chloride 0.9% 250 mL IVPB       "Followed by" Linked Group Details   200 mg 580 mL/hr over 30 Minutes Intravenous Once 02/11/21 1423 02/11/21 1753   02/11/21 1500  cefTRIAXone (ROCEPHIN) 1 g in sodium chloride 0.9 % 100 mL IVPB  Status:  Discontinued        1 g 200 mL/hr over 30  Minutes Intravenous Every 24 hours 02/11/21 1426 02/12/21 0920       I have personally reviewed the following labs and images: CBC: Recent Labs  Lab 02/11/21 0946 02/12/21 0537 02/13/21 0424 02/14/21 0440 02/15/21 0511 02/16/21 0457  WBC 8.1 6.3 7.7 7.1 6.2 5.9  NEUTROABS 5.7 4.0  --   --   --   --   HGB 10.1* 9.4* 9.2* 9.7* 9.5* 9.1*  HCT 30.0* 28.4* 28.2* 28.8* 28.2* 28.1*  MCV 83.3 83.8 85.5 84.5 84.9 85.7  PLT 240 234 249 262 268 269   BMP &GFR Recent Labs  Lab 02/12/21 0537 02/13/21 0424 02/14/21 0440 02/15/21 0511 02/16/21 0457  NA 130* 132* 134* 134* 135  K 4.0 4.5 5.2* 4.8 4.8  CL 98 103 103  103 103  CO2 25 24 23 25 25   GLUCOSE 100* 97 98 91 92  BUN 35* 40* 45* 46* 48*  CREATININE 2.13* 2.15* 1.98* 2.18* 2.16*  CALCIUM 8.3* 8.3* 8.5* 8.4* 8.4*  MG  --  2.5* 2.2 2.0 2.0   Estimated Creatinine Clearance: 32.4 mL/min (A) (by C-G formula based on SCr of 2.16 mg/dL (H)). Liver & Pancreas: Recent Labs  Lab 02/11/21 0946 02/12/21 0537  AST 33 28  ALT 55* 43  ALKPHOS 111 97  BILITOT 1.1 0.7  PROT 6.9 6.1*  ALBUMIN 2.9* 2.5*   No results for input(s): LIPASE, AMYLASE in the last 168 hours. Recent Labs  Lab 02/11/21 0946  AMMONIA 11   Diabetic: No results for input(s): HGBA1C in the last 72 hours. No results for input(s): GLUCAP in the last 168 hours. Cardiac Enzymes: No results for input(s): CKTOTAL, CKMB, CKMBINDEX, TROPONINI in the last 168 hours. No results for input(s): PROBNP in the last 8760 hours. Coagulation Profile: No results for input(s): INR, PROTIME in the last 168 hours. Thyroid Function Tests: No results for input(s): TSH, T4TOTAL, FREET4, T3FREE, THYROIDAB in the last 72 hours. Lipid Profile: No results for input(s): CHOL, HDL, LDLCALC, TRIG, CHOLHDL, LDLDIRECT in the last 72 hours. Anemia Panel: No results for input(s): VITAMINB12, FOLATE, FERRITIN, TIBC, IRON, RETICCTPCT in the last 72 hours. Urine analysis:    Component  Value Date/Time   COLORURINE YELLOW (A) 02/11/2021 0946   APPEARANCEUR CLOUDY (A) 02/11/2021 0946   APPEARANCEUR Clear 06/14/2018 1341   LABSPEC 1.008 02/11/2021 0946   LABSPEC 1.004 04/03/2015 1216   PHURINE 5.0 02/11/2021 0946   GLUCOSEU NEGATIVE 02/11/2021 0946   GLUCOSEU Negative 04/03/2015 1216   HGBUR MODERATE (A) 02/11/2021 0946   BILIRUBINUR NEGATIVE 02/11/2021 0946   BILIRUBINUR Negative 06/14/2018 1341   BILIRUBINUR Negative 04/03/2015 1216   KETONESUR NEGATIVE 02/11/2021 0946   PROTEINUR 30 (A) 02/11/2021 0946   UROBILINOGEN 0.2 03/27/2011 0845   NITRITE NEGATIVE 02/11/2021 0946   LEUKOCYTESUR LARGE (A) 02/11/2021 0946   LEUKOCYTESUR Negative 04/03/2015 1216   Sepsis Labs: Invalid input(s): PROCALCITONIN, [address]  Microbiology: Recent Results (from the past 240 hour(s))  Resp Panel by RT-PCR (Flu A&B, Covid) Nasopharyngeal Swab     Status: Abnormal   Collection Time: 02/11/21  9:46 AM   Specimen: Nasopharyngeal Swab; Nasopharyngeal(NP) swabs in vial transport medium  Result Value Ref Range Status   SARS Coronavirus 2 by RT PCR POSITIVE (A) NEGATIVE Final    Comment: RESULT CALLED TO, READ BACK BY AND VERIFIED WITH:  LAURA HERNANDEZ AT 1104 02/11/21 SDR (NOTE) SARS-CoV-2 target nucleic acids are DETECTED.  The SARS-CoV-2 RNA is generally detectable in upper respiratory specimens during the acute phase of infection. Positive results are indicative of the presence of the identified virus, but do not rule out bacterial infection or co-infection with other pathogens not detected by the test. Clinical correlation with patient history and other diagnostic information is necessary to determine patient infection status. The expected result is Negative.  Fact Sheet for Patients: EntrepreneurPulse.com.au  Fact Sheet for Healthcare Providers: IncredibleEmployment.be  This test is not yet approved or cleared by the [place] FDA  and  has been authorized for detection and/or diagnosis of SARS-CoV-2 by FDA under an Emergency Use Authorization (EUA).  This EUA will remain in effect (meaning this test can  be used) for the duration of  the COVID-19 declaration under Section 564(b)(1) of the Act, 21 U.S.C. section 360bbb-3(b)(1), unless the authorization  is terminated or revoked sooner.     Influenza A by PCR NEGATIVE NEGATIVE Final   Influenza B by PCR NEGATIVE NEGATIVE Final    Comment: (NOTE) The Xpert Xpress SARS-CoV-2/FLU/RSV plus assay is intended as an aid in the diagnosis of influenza from Nasopharyngeal swab specimens and should not be used as a sole basis for treatment. Nasal washings and aspirates are unacceptable for Xpert Xpress SARS-CoV-2/FLU/RSV testing.  Fact Sheet for Patients: EntrepreneurPulse.com.au  Fact Sheet for Healthcare Providers: IncredibleEmployment.be  This test is not yet approved or cleared by the [place] FDA and has been authorized for detection and/or diagnosis of SARS-CoV-2 by FDA under an Emergency Use Authorization (EUA). This EUA will remain in effect (meaning this test can be used) for the duration of the COVID-19 declaration under Section 564(b)(1) of the Act, 21 U.S.C. section 360bbb-3(b)(1), unless the authorization is terminated or revoked.  Performed at [facility], [address] [place], [place] [address]   Blood culture (routine x 2)     Status: None   Collection Time: 02/11/21  4:43 PM   Specimen: BLOOD  Result Value Ref Range Status   Specimen Description BLOOD LEFT ANTECUBITAL  Final   Special Requests   Final    BOTTLES DRAWN AEROBIC AND ANAEROBIC Blood Culture adequate volume   Culture   Final    NO GROWTH 5 DAYS Performed at [facility], [address] [place], [place] [address]    Report Status 02/16/2021 FINAL  Final  Blood culture (routine x 2)     Status: None   Collection Time:  02/11/21  4:44 PM   Specimen: BLOOD  Result Value Ref Range Status   Specimen Description BLOOD BLOOD LEFT HAND  Final   Special Requests   Final    BOTTLES DRAWN AEROBIC AND ANAEROBIC Blood Culture adequate volume   Culture   Final    NO GROWTH 5 DAYS Performed at [facility], [name], [place],  [address]    Report Status 02/16/2021 FINAL  Final    Radiology Studies: No results found.    Shamieka Gullo T. [employer]  If 7PM-7AM, please contact night-coverage [email or web address] 02/16/2021, 2:18 PM

## 2021-02-16 NOTE — TOC Progression Note (Signed)
Transition of Care [facility]) - Progression Note    Patient Details  Name: [name] MRN: [id number] Date of Birth: [birth year]  Transition of Care [facility]) CM/SW Contact  Beverly Sessions, RN Phone Number: 02/16/2021, 10:15 AM  Clinical Narrative:    Received call from [facility].  SNF has been denied  They are offering peer to peer Dr Hilaria Ota - [phone] Has be completed 12 noon tomorrow.  MD notified    Patient has been approved for EMS transport through [facility].  Auth number [id number]    Expected Discharge Plan: Skilled Nursing Facility Barriers to Discharge: Continued Medical Work up  Expected Discharge Plan and Services Expected Discharge Plan: [place]                                               Social Determinants of Health (SDOH) Interventions    Readmission Risk Interventions No flowsheet data found.

## 2021-02-16 NOTE — Plan of Care (Signed)
  Problem: Education: Goal: Knowledge of risk factors and measures for prevention of condition will improve Outcome: Progressing   Problem: Coping: Goal: Psychosocial and spiritual needs will be supported Outcome: Progressing   Problem: Respiratory: Goal: Will maintain a patent airway Outcome: Progressing Goal: Complications related to the disease process, condition or treatment will be avoided or minimized Outcome: Progressing   Problem: Education: Goal: Knowledge of General Education information will improve Description: Including pain rating scale, medication(s)/side effects and non-pharmacologic comfort measures Outcome: Progressing   Problem: Clinical Measurements: Goal: Ability to maintain clinical measurements within normal limits will improve Outcome: Progressing Goal: Will remain free from infection Outcome: Progressing Goal: Diagnostic test results will improve Outcome: Progressing Goal: Respiratory complications will improve Outcome: Progressing Goal: Cardiovascular complication will be avoided Outcome: Progressing   Problem: Activity: Goal: Risk for activity intolerance will decrease Outcome: Progressing   Problem: Nutrition: Goal: Adequate nutrition will be maintained Outcome: Progressing   Problem: Coping: Goal: Level of anxiety will decrease Outcome: Progressing   Problem: Elimination: Goal: Will not experience complications related to bowel motility Outcome: Progressing Goal: Will not experience complications related to urinary retention Outcome: Progressing   Problem: Safety: Goal: Ability to remain free from injury will improve Outcome: Progressing   Problem: Skin Integrity: Goal: Risk for impaired skin integrity will decrease Outcome: Progressing

## 2021-02-16 NOTE — Care Plan (Signed)
Pt moved via bed to room 227, pt made aware of transfer.

## 2021-02-17 MED ORDER — GABAPENTIN 300 MG PO CAPS: 300.0000 mg | ORAL_CAPSULE | Freq: Three times a day (TID) | ORAL | 1 refills | Status: DC

## 2021-02-17 MED ORDER — OXYCODONE HCL 5 MG PO TABS
5.0000 mg | ORAL_TABLET | Freq: Three times a day (TID) | ORAL | 0 refills | Status: DC | PRN
Start: 2021-02-17 — End: 2021-06-06

## 2021-02-17 MED ORDER — ACETAMINOPHEN 500 MG PO TABS: 1000.0000 mg | ORAL_TABLET | Freq: Three times a day (TID) | ORAL | 1 refills | Status: AC

## 2021-02-17 MED ORDER — GABAPENTIN 300 MG PO CAPS: 300.0000 mg | ORAL_CAPSULE | Freq: Two times a day (BID) | ORAL | 0 refills | Status: DC

## 2021-02-17 NOTE — Care Plan (Signed)
Pt off unit in stable condition.

## 2021-02-17 NOTE — TOC Transition Note (Signed)
Transition of Care [facility]) - CM/SW Discharge Note   Patient Details  Name: [name] MRN: [id number] Date of Birth: 1935/05/29  Transition of Care [facility]) CM/SW Contact:  Beverly Sessions, RN Phone Number: 02/17/2021, 2:39 PM   Clinical Narrative:     Patient to discharge to [facility] today  AUth # [phone]  Daughter [name] notified Patient updated.   DC info sent in the hub  Bedside RN to call report EMS transport called.   Final next level of care: Skilled Nursing Facility Barriers to Discharge: No Barriers Identified   Patient Goals and CMS Choice        Discharge Placement              Patient chooses bed at: [facility] Patient to be transferred to facility by: EMS Name of family member notified: [name] Patient and family notified of of transfer: 02/17/21  Discharge Plan and Services                                     Social Determinants of Health (SDOH) Interventions     Readmission Risk Interventions No flowsheet data found.

## 2021-02-17 NOTE — Progress Notes (Signed)
AVS placed in packet. [facility] called at this time. Report called to Paulina.

## 2021-02-17 NOTE — Discharge Summary (Addendum)
Physician Discharge Summary  HRISTOPHER MISSILDINE RPR:[id number] DOB: 1935-01-13 DOA: 02/11/2021  PCP: Marguerita Merles, MD  Admit date: 02/11/2021 Discharge date: 02/17/2021  Admitted From: Home Disposition: SNF  Recommendations for Outpatient Follow-up:  1. Follow ups as below. 2. Please obtain CBC/BMP/Mag at follow up 3. Please follow up on the following pending results: None   Discharge Condition: Stable CODE STATUS: Full code   Contact information for follow-up providers    Schedule an appointment as soon as possible for a visit  with Marguerita Merles, MD.   Specialty: Family Medicine Contact information: [place] [address] [phone]            Contact information for after-discharge care    [facility] .   Service: Skilled Nursing Contact information: [place] [facility] Course: 86 year old M with PMH of CAD/DES to the right CEA in 2005, NIDDM-2, HTN, HLD, combined CHF, OSA not on CPAP, CKD-4, PAD, bipolar disorder and alcohol abuse brought to ED by EMS with concern for facial droop pain per patient's daughter although not noted by EMS or previous providers.  Patient tested positive for COVID-19 infection.  He had no significant symptoms or radiologic evidence.  He was treated with remdesivir for 3 days.  Therapy recommended SNF.  Patient would benefit from alcohol rehab once he is physically rehabilitated.  See individual problem list below for more on hospital course.  Discharge Diagnoses:  Subjective facial droop: Reported by patient's daughter but not noted on exam by EMS providers so far.  CT head without contrast without acute finding.  No focal neuro deficits on exam.  Could be related to encephalopathy.  Generalized weakness/ambulatory dysfunction after recent left hip fracture/peripheral neuropathy -Scheduled Tylenol  with as needed oxycodone for pain. -Continue home gabapentin as 300 mg twice daily-renally dosed. -Continue PT/OT  COVID-19 infection-Tested positive on 02/11/2021. Reports shortness of breath, chest pain and dry cough but no hypoxia or radiologic evidence of infection.  No respiratory distress on exam.  Completed 3 days of IV remdesivir -Supportive care with incentive spirometry, OOB/PT/OT -Airborne isolation precaution through 02/21/2021.  Essential hypertension: Normotensive -Continue home amlodipine, Coreg, Imdur  Insomnia/bipolar disorder: Stable -quetiapine at bedtime  Hyperlipidemia -atorvastatin   CAD s/p DES to RCA in 2005: Stable -Continue clopidogrel, aspirin, Coreg, atorvastatin,  Chronic combined CHF: TTE in 03/2019 with LVEF of 45 to 50%, severe LVH with unknown DD.  He is on p.o. Lasix at home.  Does not appear fluid overloaded. -Continue home Lasix -GDMT-Coreg and Imdur as above -Monitor fluid status with daily weight  Chronic COPD -trilogyand Breo-Ellipta  OSA not compliant with CPAP -Recommend outpatient follow-up at sleep clinic  Chronic alcoholism-seems to be main issue.  Per patient's daughter, seems to be medicating his left hip pain with alcohol.  No signs of withdrawal here. -Recommend alcohol rehab in the future once physically optimized -Multivitamin, thiamine and folic acid  Morbid obesity Body mass index is 43.51 kg/m.            Discharge Exam: Vitals:   02/17/21 0520 02/17/21 0819  BP: (!) 155/87 110/81  Pulse: 64 60  Resp: 20 20  Temp: (!) 97.3 F (36.3 C) 97.9 F (36.6 C)  SpO2: 96% 96%    GENERAL: No apparent distress.  Nontoxic. HEENT:  MMM.  Vision and hearing grossly intact.  NECK: Supple.  No apparent JVD.  RESP: On room air.  No IWOB.  Fair aeration bilaterally. CVS:  RRR. Heart sounds normal.  ABD/GI/GU: Bowel sounds present. Soft. Non tender.  MSK/EXT:  Moves extremities. No apparent deformity. No  edema.  SKIN: no apparent skin lesion or wound NEURO: Awake, alert and oriented appropriately.  No apparent focal neuro deficit. PSYCH: Calm. Normal affect.  Discharge Instructions   Allergies as of 02/17/2021      Reactions   Chlorthalidone Other (See Comments)   Hyponatremia   Ace Inhibitors Cough   Hydrochlorothiazide    Lisinopril Other (See Comments)   Losartan    Other Other (See Comments)   "ANY BLOOD PRESSURE MEDICATIONS THAT I'VE TRIED" - PT. DOES NOT REMEMBER WHICH ONES "ANY BLOOD PRESSURE MEDICATIONS THAT I'VE TRIED"- PT. DOES NOT REMEMBER WHICH ONES- CONSTIPATION   Benazepril-hydrochlorothiazide Other (See Comments)   Constipation      Medication List    STOP taking these medications   HYDROcodone-acetaminophen 5-325 MG tablet Commonly known as: NORCO/VICODIN   naproxen 500 MG tablet Commonly known as: NAPROSYN     TAKE these medications   acetaminophen 500 MG tablet Commonly known as: TYLENOL Take 2 tablets (1,000 mg total) by mouth every 8 (eight) hours.   Adult Aspirin Regimen 81 MG EC tablet Generic drug: aspirin TAKE 1 TABLET BY MOUTH DAILY What changed: how much to take   albuterol 108 (90 Base) MCG/ACT inhaler Commonly known as: VENTOLIN HFA USE 2 PUFFS EVERY 4 HOURS AS NEEDED What changed: See the new instructions.   amLODipine 5 MG tablet Commonly known as: NORVASC Take 1 tablet (5 mg total) by mouth daily.   atorvastatin 80 MG tablet Commonly known as: LIPITOR Take 0.5 tablets (40 mg total) by mouth daily.   azelastine 0.1 % nasal spray Commonly known as: ASTELIN Place 1 spray into both nostrils daily as needed for allergies.   calcium carbonate 1500 (600 Ca) MG Tabs tablet Commonly known as: OSCAL Take 1 tablet (1,500 mg total) by mouth 2 (two) times daily with a meal.   carvedilol 6.25 MG tablet Commonly known as: COREG TAKE 1 TABLET BY MOUTH TWICE A DAY What changed: when to take this   clopidogrel 75 MG tablet Commonly  known as: PLAVIX Take 1 tablet (75 mg total) by mouth daily.   feeding supplement Liqd Take 237 mLs by mouth 3 (three) times daily between meals.   fluticasone 50 MCG/ACT nasal spray Commonly known as: FLONASE Place 2 sprays into both nostrils daily.   furosemide 40 MG tablet Commonly known as: LASIX TAKE 1 TABLET BY MOUTH DAILY AND TAKE ANADDITIONAL TABLET IN THE AFTERNOON AS NEEDED FOR WEIGHT GAIN AND LEG EDEMA What changed: See the new instructions.   gabapentin 300 MG capsule Commonly known as: Neurontin Take 1 capsule (300 mg total) by mouth 2 (two) times daily. What changed: See the new instructions.   ipratropium 0.03 % nasal spray Commonly known as: ATROVENT Place 2 sprays into both nostrils daily.   isosorbide mononitrate 30 MG 24 hr tablet Commonly known as: IMDUR TAKE 1 TABLET BY MOUTH DAILY   Magnesium Oxide 500 MG Caps Take 1 capsule (500 mg total) by mouth daily.   multivitamin with minerals Tabs tablet Take 1 tablet by mouth daily.   nitroGLYCERIN 0.4 MG SL tablet Commonly known as: NITROSTAT Take 0.4 mg by mouth every 5 (five) minutes x 3 doses as needed  for chest pain. As needed for chest pain   oxyCODONE 5 MG immediate release tablet Commonly known as: Oxy IR/ROXICODONE Take 1 tablet (5 mg total) by mouth every 8 (eight) hours as needed for severe pain or breakthrough pain.   QUEtiapine 50 MG tablet Commonly known as: SEROQUEL Take 50 mg by mouth at bedtime.   Trelegy Ellipta 200-62.5-25 MCG/INH Aepb Generic drug: Fluticasone-Umeclidin-Vilant Inhale 1 puff into the lungs daily.       Consultations:  None  Procedures/Studies:   DG Chest 1 View  Result Date: 02/11/2021 CLINICAL DATA:  Weakness, COVID-19. EXAM: CHEST  1 VIEW COMPARISON:  January 08, 2021. FINDINGS: Stable cardiomegaly. Lungs are clear. No pneumothorax or pleural effusion is noted. Bony thorax is unremarkable. IMPRESSION: No acute cardiopulmonary abnormality seen. Aortic  Atherosclerosis (ICD10-I70.0). Electronically Signed   By: Marijo Conception M.D.   On: 02/11/2021 13:22   CT Head Wo Contrast  Result Date: 02/11/2021 CLINICAL DATA:  Right facial droop EXAM: CT HEAD WITHOUT CONTRAST TECHNIQUE: Contiguous axial images were obtained from the base of the skull through the vertex without intravenous contrast. COMPARISON:  01/27/2021 FINDINGS: Brain: No evidence of acute infarction, hemorrhage, hydrocephalus, or extra-axial collection. 3.8 x 3.9 cm meningioma arising from the left upper tentorium (series 3/image 17), with mild mass effect on the left temporo-occipital region, unchanged. Vascular: Mild subcortical white matter and periventricular small vessel ischemic changes. Skull: Normal. Negative for fracture or focal lesion. Sinuses/Orbits: Near complete opacification of the right maxillary sinus. Partial opacification of the right frontal and sphenoid sinuses. Mastoid air cells are clear. Other: None. IMPRESSION: No evidence of acute intracranial abnormality. Stable 3.9 cm meningioma arising from the left upper tentorium. Electronically Signed   By: Julian Hy M.D.   On: 02/11/2021 10:24   CT Head Wo Contrast  Result Date: 01/27/2021 CLINICAL DATA:  Acute mental status changes.  Confusion. EXAM: CT HEAD WITHOUT CONTRAST TECHNIQUE: Contiguous axial images were obtained from the base of the skull through the vertex without intravenous contrast. COMPARISON:  01/19/2018 FINDINGS: Brain: No abnormality affects the brainstem or cerebellum. There is a meningioma arising from the left upper tentorium with areas of calcification. This has enlarged slightly since 2019. Dimension at that time was approximately 31 x 36 mm. Measured in the same locations today, the measurements are 34 x 38 mm. There is mass-effect upon the posteromedial left temporal lobe and occipital lobe, but without brain edema or any midline shift. Cerebral hemispheres otherwise show minimal small vessel  change of the white matter. No hydrocephalus, hemorrhage or extra-axial collection. Vascular: There is atherosclerotic calcification of the major vessels at the base of the brain. Skull: Negative Sinuses/Orbits: Clear/normal Other: None IMPRESSION: 1. No acute finding by CT. 2. Slowly enlarging meningioma arising from the left upper tentorium with mild mass-effect upon the posteromedial left temporal lobe and occipital lobe, but without brain edema or any midline shift. Electronically Signed   By: Nelson Chimes M.D.   On: 01/27/2021 23:55   DG Pelvis Portable  Result Date: 02/11/2021 CLINICAL DATA:  Weakness. EXAM: PORTABLE PELVIS 1-2 VIEWS COMPARISON:  September 04, 2012. FINDINGS: There is no evidence of acute pelvic fracture or diastasis. No pelvic bone lesions are seen. Status post right total hip arthroplasty. Status post surgical internal fixation of old proximal left femoral fracture. There appears to be chronic resorption of the left femoral head with degenerative joint disease of the left hip. IMPRESSION: No acute abnormality seen in the pelvis. Electronically Signed  By: Marijo Conception M.D.   On: 02/11/2021 13:25       The results of significant diagnostics from this hospitalization (including imaging, microbiology, ancillary and laboratory) are listed below for reference.     Microbiology: Recent Results (from the past 240 hour(s))  Resp Panel by RT-PCR (Flu A&B, Covid) Nasopharyngeal Swab     Status: Abnormal   Collection Time: 02/11/21  9:46 AM   Specimen: Nasopharyngeal Swab; Nasopharyngeal(NP) swabs in vial transport medium  Result Value Ref Range Status   SARS Coronavirus 2 by RT PCR POSITIVE (A) NEGATIVE Final    Comment: RESULT CALLED TO, READ BACK BY AND VERIFIED WITH:  LAURA HERNANDEZ AT 1104 02/11/21 SDR (NOTE) SARS-CoV-2 target nucleic acids are DETECTED.  The SARS-CoV-2 RNA is generally detectable in upper respiratory specimens during the acute phase of infection.  Positive results are indicative of the presence of the identified virus, but do not rule out bacterial infection or co-infection with other pathogens not detected by the test. Clinical correlation with patient history and other diagnostic information is necessary to determine patient infection status. The expected result is Negative.  Fact Sheet for Patients: EntrepreneurPulse.com.au  Fact Sheet for Healthcare Providers: IncredibleEmployment.be  This test is not yet approved or cleared by the [place] FDA and  has been authorized for detection and/or diagnosis of SARS-CoV-2 by FDA under an Emergency Use Authorization (EUA).  This EUA will remain in effect (meaning this test can  be used) for the duration of  the COVID-19 declaration under Section 564(b)(1) of the Act, 21 U.S.C. section 360bbb-3(b)(1), unless the authorization is terminated or revoked sooner.     Influenza A by PCR NEGATIVE NEGATIVE Final   Influenza B by PCR NEGATIVE NEGATIVE Final    Comment: (NOTE) The Xpert Xpress SARS-CoV-2/FLU/RSV plus assay is intended as an aid in the diagnosis of influenza from Nasopharyngeal swab specimens and should not be used as a sole basis for treatment. Nasal washings and aspirates are unacceptable for Xpert Xpress SARS-CoV-2/FLU/RSV testing.  Fact Sheet for Patients: EntrepreneurPulse.com.au  Fact Sheet for Healthcare Providers: IncredibleEmployment.be  This test is not yet approved or cleared by the [place] FDA and has been authorized for detection and/or diagnosis of SARS-CoV-2 by FDA under an Emergency Use Authorization (EUA). This EUA will remain in effect (meaning this test can be used) for the duration of the COVID-19 declaration under Section 564(b)(1) of the Act, 21 U.S.C. section 360bbb-3(b)(1), unless the authorization is terminated or revoked.  Performed at [facility],  [address] [place], [place] [address]   Blood culture (routine x 2)     Status: None   Collection Time: 02/11/21  4:43 PM   Specimen: BLOOD  Result Value Ref Range Status   Specimen Description BLOOD LEFT ANTECUBITAL  Final   Special Requests   Final    BOTTLES DRAWN AEROBIC AND ANAEROBIC Blood Culture adequate volume   Culture   Final    NO GROWTH 5 DAYS Performed at [facility], [address], [place], [place] [address]    Report Status 02/16/2021 FINAL  Final  Blood culture (routine x 2)     Status: None   Collection Time: 02/11/21  4:44 PM   Specimen: BLOOD  Result Value Ref Range Status   Specimen Description BLOOD BLOOD LEFT HAND  Final   Special Requests   Final    BOTTLES DRAWN AEROBIC AND ANAEROBIC Blood Culture adequate volume   Culture  Final    NO GROWTH 5 DAYS Performed at [facility], [address] [place], [place] [address]    Report Status 02/16/2021 FINAL  Final     Labs:  CBC: Recent Labs  Lab 02/11/21 0946 02/12/21 0537 02/13/21 0424 02/14/21 0440 02/15/21 0511 02/16/21 0457  WBC 8.1 6.3 7.7 7.1 6.2 5.9  NEUTROABS 5.7 4.0  --   --   --   --   HGB 10.1* 9.4* 9.2* 9.7* 9.5* 9.1*  HCT 30.0* 28.4* 28.2* 28.8* 28.2* 28.1*  MCV 83.3 83.8 85.5 84.5 84.9 85.7  PLT 240 234 249 262 268 269   BMP &GFR Recent Labs  Lab 02/12/21 0537 02/13/21 0424 02/14/21 0440 02/15/21 0511 02/16/21 0457  NA 130* 132* 134* 134* 135  K 4.0 4.5 5.2* 4.8 4.8  CL 98 103 103 103 103  CO2 25 24 23 25 25   GLUCOSE 100* 97 98 91 92  BUN 35* 40* 45* 46* 48*  CREATININE 2.13* 2.15* 1.98* 2.18* 2.16*  CALCIUM 8.3* 8.3* 8.5* 8.4* 8.4*  MG  --  2.5* 2.2 2.0 2.0   Estimated Creatinine Clearance: 32.4 mL/min (A) (by C-G formula based on SCr of 2.16 mg/dL (H)). Liver & Pancreas: Recent Labs  Lab 02/11/21 0946 02/12/21 0537  AST 33 28  ALT 55* 43  ALKPHOS 111 97  BILITOT 1.1 0.7  PROT 6.9 6.1*  ALBUMIN 2.9* 2.5*   No results for  input(s): LIPASE, AMYLASE in the last 168 hours. Recent Labs  Lab 02/11/21 0946  AMMONIA 11   Diabetic: No results for input(s): HGBA1C in the last 72 hours. No results for input(s): GLUCAP in the last 168 hours. Cardiac Enzymes: No results for input(s): CKTOTAL, CKMB, CKMBINDEX, TROPONINI in the last 168 hours. No results for input(s): PROBNP in the last 8760 hours. Coagulation Profile: No results for input(s): INR, PROTIME in the last 168 hours. Thyroid Function Tests: No results for input(s): TSH, T4TOTAL, FREET4, T3FREE, THYROIDAB in the last 72 hours. Lipid Profile: No results for input(s): CHOL, HDL, LDLCALC, TRIG, CHOLHDL, LDLDIRECT in the last 72 hours. Anemia Panel: No results for input(s): VITAMINB12, FOLATE, FERRITIN, TIBC, IRON, RETICCTPCT in the last 72 hours. Urine analysis:    Component Value Date/Time   COLORURINE YELLOW (A) 02/11/2021 0946   APPEARANCEUR CLOUDY (A) 02/11/2021 0946   APPEARANCEUR Clear 06/14/2018 1341   LABSPEC 1.008 02/11/2021 0946   LABSPEC 1.004 04/03/2015 1216   PHURINE 5.0 02/11/2021 0946   GLUCOSEU NEGATIVE 02/11/2021 0946   GLUCOSEU Negative 04/03/2015 1216   HGBUR MODERATE (A) 02/11/2021 0946   BILIRUBINUR NEGATIVE 02/11/2021 0946   BILIRUBINUR Negative 06/14/2018 1341   BILIRUBINUR Negative 04/03/2015 1216   KETONESUR NEGATIVE 02/11/2021 0946   PROTEINUR 30 (A) 02/11/2021 0946   UROBILINOGEN 0.2 03/27/2011 0845   NITRITE NEGATIVE 02/11/2021 0946   LEUKOCYTESUR LARGE (A) 02/11/2021 0946   LEUKOCYTESUR Negative 04/03/2015 1216   Sepsis Labs: Invalid input(s): PROCALCITONIN, LACTICIDVEN   Time coordinating discharge: 40 minutes  SIGNED:  Mercy Riding, MD  [facility] 02/17/2021, 2:24 PM  If 7PM-7AM, please contact night-coverage [email or web address]

## 2021-04-15 DIAGNOSIS — E1122 Type 2 diabetes mellitus with diabetic chronic kidney disease: Secondary | ICD-10-CM | POA: Insufficient documentation

## 2021-04-15 DIAGNOSIS — E1151 Type 2 diabetes mellitus with diabetic peripheral angiopathy without gangrene: Secondary | ICD-10-CM | POA: Insufficient documentation

## 2021-04-15 DIAGNOSIS — E871 Hypo-osmolality and hyponatremia: Secondary | ICD-10-CM | POA: Insufficient documentation

## 2021-04-15 DIAGNOSIS — E039 Hypothyroidism, unspecified: Secondary | ICD-10-CM | POA: Insufficient documentation

## 2021-04-21 ENCOUNTER — Other Ambulatory Visit: Payer: Self-pay | Admitting: Cardiovascular Disease

## 2021-05-10 ENCOUNTER — Other Ambulatory Visit: Payer: Self-pay | Admitting: Cardiovascular Disease

## 2021-06-05 NOTE — Progress Notes (Signed)
PROVIDER NOTE: Information contained herein reflects review and annotations entered in association with encounter. Interpretation of such information and data should be left to medically-trained personnel. Information provided to patient can be located elsewhere in the medical record under "Patient Instructions". Document created using STT-dictation technology, any transcriptional errors that may result from process are unintentional.    Patient: [name]  Service Category: E/M  Provider: Gaspar Cola, MD  DOB: 1935/09/02  DOS: 06/06/2021  Specialty: Interventional Pain Management  MRN: [id number]  Setting: Ambulatory outpatient  PCP: Marguerita Merles, MD  Type: Established Patient    Referring Provider: Marguerita Merles, MD  Location: Office  Delivery: Face-to-face     HPI  [name], a 86 y.o. year old male, is here today because of his Chronic pain syndrome [G89.4]. Mr. Devincent primary complain today is Hip Pain (Left s/p fx x 1 year ago ), Knee Pain (Bilateral ), and Shoulder Pain (Right ) Last encounter: My last encounter with him was on 01/06/2021. Pertinent problems: Mr. Punt has [facility]); Lower extremity pain, bilateral; Chronic pain syndrome; Chronic hip pain, left; Unilateral post-traumatic osteoarthritis of hip (Left); Chronic knee pain (2ry area of Pain) (Bilateral); Secondary osteoarthritis of knee (Bilateral); Osteoarthritis of knees (Bilateral); Chronic shoulder pain (3ry area of Pain) (Bilateral); Grade 1 (3 mm) anterolisthesis of lumbar spine (L4-5) (stable); Chronic groin pain (Left); Other intervertebral disc degeneration, lumbar region; Neurogenic pain;  Klippel-Feil deformity at the C5-C7 levels; Lumbosacral spondylosis; Osteoarthritis of facet joint of lumbar spine; DDD (degenerative disc disease), lumbosacral; History of total hip replacement (Right); Osteoarthritis of hip (Left); Abnormal MRI, lumbar spine (10/07/2020); Lumbar central spinal stenosis with  neurogenic claudication; Lumbar foraminal stenosis; Polyarthritis with positive rheumatoid factor ([place]); Hip joint effusion (Left); Primary osteoarthritis of left knee; Arthropathy of left knee; Chronic pain of left knee; Hip fracture ([place]); Right calcaneal fracture; and PVD (peripheral vascular disease) ([place]) on their pertinent problem list. Pain Assessment: Severity of   is reported as a  /10. Location:    / . Onset:  . Quality:  . Timing:  . Modifying factor(s):  Marland Kitchen Vitals:  height is _0  (1.702 m) and weight is 277 lb (125.6 kg). His temporal temperature is 97.1 F (36.2 C) (abnormal). His blood pressure is 167/81 (abnormal) and his pulse is 66. His respiration is 16 and oxygen saturation is 98%.   Reason for encounter: post-procedure assessment.  The patient was treated with a left intra-articular knee joint injection and left intra-articular hip joint injection on 12/23/2020.  He returned on 01/06/2021 for a second intra-articular left knee joint injection this time with Hyalgan.  He was supposed to return in 2 weeks for a second injection but he never came back.  He returns today 06/06/2021 for evaluation and follow-up, but clearly he does not remember the results of any of these injections and therefore we have lost all of the information from the diagnostic portion of the procedure.  The patient comes into the clinic today with quite a bit of pain in the area of the hip.  I could hear him complain from the examining room all the way down to my office.  The nurses did help him with some pillows to lessen his discomfort.  The last time that I saw this patient was in January.  We did some interventional therapies for him and I did prescribe oxycodone.  Today when I attempted to get some information from him all he would  say is "nothing helps".  Thank God, his care giver was with him and she was able to provide me with some useful information.  Eventually we got to the point where it did seem that the  oxycodone 5 mg did help his pain.  However he has been having some problems with his insurance company and he refers that he has no PCP and I am the first physician that he sees since January.  According to the PMP, we wrote a prescription for oxycodone IR for this patient on 10/25/2020 and on 03/22/2021 he received an oxycodone prescription from [facility], NP in [place], in violation of our medication agreement.  The PMP also revealed that he used 120 tablets over a period of 148 days, which is an average of 0.8 tablets/day or 24 tablets/month.  Medical record also indicates the patient to have received an oxycodone prescription on 02/17/2021 by Mercy Riding, MD, a hospitalist at [facility] in [place].  The patient apparently fell and really fractured his hip around 02/17/2021.  Obviously, the accuracy of the PMP is going to be difficult to gauge since he had not been getting the medications on a regular basis.  Today I will provide him with a prescription for oxycodone IR 5 mg to be taken every 8 hours and I will reevaluate him in 30 days to see how he is doing.  I have provided them with information about our medication rules and regulations as well as some very specific questions to evaluate the medication.  RTCB: 07/06/2021 Nonopioids transfer 11/29/2020: Magnesium, calcium, vitamin D3, and gabapentin.  Post-Procedure Evaluation  Procedure (01/06/2021): Therapeutic left IA Hyalgan knee injection #2, no fluoroscopy or IV sedation Pre-procedure pain level: 7/10 Post-procedure: 0/10 (100% relief)  Sedation: None.  Effectiveness during initial hour after procedure(Ultra-Short Term Relief): 0 %.  Local anesthetic used: Long-acting (4-6 hours) Effectiveness: Defined as any analgesic benefit obtained secondary to the administration of local anesthetics. This carries significant diagnostic value as to the etiological location, or anatomical origin, of the pain. Duration of benefit is  expected to coincide with the duration of the local anesthetic used.  Effectiveness during initial 4-6 hours after procedure(Short-Term Relief): 0 %.  Long-term benefit: Defined as any relief past the pharmacologic duration of the local anesthetics.  Effectiveness past the initial 6 hours after procedure(Long-Term Relief): 0 %.  Current benefits: Defined as benefit that persist at this time.   Analgesia:  No benefit Function: No benefit ROM: No benefit   Pharmacotherapy Assessment  Analgesic: Oxycodone IR 5 mg, 1 tab p.o. daily as needed (23 pills/month, average use) MME/day: 7.5 mg/day   Monitoring: [place] PMP: PDMP reviewed during this encounter.       Pharmacotherapy: No side-effects or adverse reactions reported. Compliance: No problems identified. Effectiveness: Clinically acceptable.  No notes on file  UDS:  Summary  Date Value Ref Range Status  09/20/2020 Note  Final    Comment:    ==================================================================== Compliance Drug Analysis, Ur ==================================================================== Test                             Result       Flag       Units  Drug Present and Declared for Prescription Verification   Tramadol                       >4274  EXPECTED   ng/mg creat   O-Desmethyltramadol            >4274        EXPECTED   ng/mg creat   N-Desmethyltramadol            843          EXPECTED   ng/mg creat    Source of tramadol is a prescription medication. O-desmethyltramadol    and N-desmethyltramadol are expected metabolites of tramadol.    Quetiapine                     PRESENT      EXPECTED   Salicylate                     PRESENT      EXPECTED  Drug Present not Declared for Prescription Verification   Naproxen                       PRESENT      UNEXPECTED  Drug Absent but Declared for Prescription Verification   Tizanidine                     Not Detected UNEXPECTED    Tizanidine, as indicated in the  declared medication list, is not    always detected even when used as directed.  ==================================================================== Test                      Result    Flag   Units      Ref Range   Creatinine              117              mg/dL      >=20 ==================================================================== Declared Medications:  The flagging and interpretation on this report are based on the  following declared medications.  Unexpected results may arise from  inaccuracies in the declared medications.   **Note: The testing scope of this panel includes these medications:   Quetiapine (Seroquel)  Tramadol (Ultram)   **Note: The testing scope of this panel does not include small to  moderate amounts of these reported medications:   Aspirin  Tizanidine (Zanaflex)   **Note: The testing scope of this panel does not include the  following reported medications:   Albuterol  Amlodipine  Atorvastatin  Carvedilol  Clopidogrel (Plavix)  Fluticasone (Flonase)  Fluticasone (Trelegy)  Furosemide (Lasix)  Ipratropium (Atrovent)  Isosorbide (Imdur)  Nitroglycerin (Nitrostat)  Umeclidinium (Trelegy)  Vilanterol (Trelegy) ==================================================================== For clinical consultation, please call [phone]. ====================================================================      ROS  Constitutional: Denies any fever or chills Gastrointestinal: No reported hemesis, hematochezia, vomiting, or acute GI distress Musculoskeletal: Denies any acute onset joint swelling, redness, loss of ROM, or weakness Neurological: No reported episodes of acute onset apraxia, aphasia, dysarthria, agnosia, amnesia, paralysis, loss of coordination, or loss of consciousness  Medication Review  Fluticasone-Umeclidin-Vilant, Magnesium Oxide, QUEtiapine, Vitamin D3, albuterol, amLODipine, aspirin, atorvastatin, azelastine, calcium carbonate,  carvedilol, clopidogrel, feeding supplement, fluticasone, furosemide, gabapentin, ipratropium, isosorbide mononitrate, multivitamin with minerals, nitroGLYCERIN, and oxyCODONE  History Review  Allergy: Mr. Tift is allergic to chlorthalidone, ace inhibitors, hydrochlorothiazide, lisinopril, losartan, other, and benazepril-hydrochlorothiazide. Drug: Mr. Arvidson  reports no history of drug use. Alcohol:  reports previous alcohol use. Tobacco:  reports that he has quit smoking. His smoking use included cigarettes. He has a 50.00 pack-year smoking  history. He has never used smokeless tobacco. Social: Mr. Treese  reports that he has quit smoking. His smoking use included cigarettes. He has a 50.00 pack-year smoking history. He has never used smokeless tobacco. He reports previous alcohol use. He reports that he does not use drugs. Medical:  has a past medical history of Alcoholism ([place]), Asthma, Bipolar affective disorder ([place]), Chronic combined systolic and diastolic CHF (congestive heart failure) ([place]), CKD (chronic kidney disease), stage III ([place]), COPD (chronic obstructive pulmonary disease) ([place]), Coronary artery disease, Degenerative joint disease, Dyspnea, Essential hypertension, Hyperlipidemia, Hypertension, Ischemic cardiomyopathy, OSA (obstructive sleep apnea), Prostate CA ([place]), PVD (peripheral vascular disease) ([place]), and Tobacco abuse. Surgical: Mr. Derasmo  has a past surgical history that includes Total hip arthroplasty; Coronary/Graft Acute MI Revascularization (N/A, 04/01/2019); CORONARY ANGIOGRAPHY (N/A, 04/01/2019); Cardiac catheterization; and Coronary angioplasty with stent. Family: family history includes Heart attack in his father and mother; Hyperlipidemia in his father and mother; Hypertension in his father and mother.  Laboratory Chemistry Profile   Renal Lab Results  Component Value Date   BUN 48 (H) 02/16/2021   CREATININE 2.16 (H) 02/16/2021   LABCREA 124 05/29/2017   BCR 14  09/20/2020   GFRAA 30 (L) 09/20/2020   GFRNONAA 29 (L) 02/16/2021     Hepatic Lab Results  Component Value Date   AST 28 02/12/2021   ALT 43 02/12/2021   ALBUMIN 2.5 (L) 02/12/2021   ALKPHOS 97 02/12/2021   HCVAB NEGATIVE 10/31/2010   AMYLASE 56 11/01/2010   LIPASE 48 07/31/2017   AMMONIA 11 02/11/2021     Electrolytes Lab Results  Component Value Date   NA 135 02/16/2021   K 4.8 02/16/2021   CL 103 02/16/2021   CALCIUM 8.4 (L) 02/16/2021   MG 2.0 02/16/2021   PHOS 4.6 01/10/2021     Bone Lab Results  Component Value Date   VD25OH 52.12 01/09/2021   25OHVITD1 16 (L) 09/20/2020   25OHVITD2 <1.0 09/20/2020   25OHVITD3 15 09/20/2020     Inflammation (CRP: Acute Phase) (ESR: Chronic Phase) Lab Results  Component Value Date   CRP 5.7 (H) 02/16/2021   ESRSEDRATE 87 (H) 01/09/2021   LATICACIDVEN 1.0 10/30/2010       Note: Above Lab results reviewed.  Recent Imaging Review  DG Pelvis Portable CLINICAL DATA:  Weakness.  EXAM: PORTABLE PELVIS 1-2 VIEWS  COMPARISON:  September 04, 2012.  FINDINGS: There is no evidence of acute pelvic fracture or diastasis. No pelvic bone lesions are seen. Status post right total hip arthroplasty. Status post surgical internal fixation of old proximal left femoral fracture. There appears to be chronic resorption of the left femoral head with degenerative joint disease of the left hip.  IMPRESSION: No acute abnormality seen in the pelvis.  Electronically Signed   By: Marijo Conception M.D.   On: 02/11/2021 13:25 DG Chest 1 View CLINICAL DATA:  Weakness, COVID-19.  EXAM: CHEST  1 VIEW  COMPARISON:  January 08, 2021.  FINDINGS: Stable cardiomegaly. Lungs are clear. No pneumothorax or pleural effusion is noted. Bony thorax is unremarkable.  IMPRESSION: No acute cardiopulmonary abnormality seen.  Aortic Atherosclerosis (ICD10-I70.0).  Electronically Signed   By: Marijo Conception M.D.   On: 02/11/2021 13:22 CT Head  Wo Contrast CLINICAL DATA:  Right facial droop  EXAM: CT HEAD WITHOUT CONTRAST  TECHNIQUE: Contiguous axial images were obtained from the base of the skull through the vertex without intravenous contrast.  COMPARISON:  01/27/2021  FINDINGS: Brain: No  evidence of acute infarction, hemorrhage, hydrocephalus, or extra-axial collection.  3.8 x 3.9 cm meningioma arising from the left upper tentorium (series 3/image 17), with mild mass effect on the left temporo-occipital region, unchanged.  Vascular: Mild subcortical white matter and periventricular small vessel ischemic changes.  Skull: Normal. Negative for fracture or focal lesion.  Sinuses/Orbits: Near complete opacification of the right maxillary sinus. Partial opacification of the right frontal and sphenoid sinuses. Mastoid air cells are clear.  Other: None.  IMPRESSION: No evidence of acute intracranial abnormality.  Stable 3.9 cm meningioma arising from the left upper tentorium.  Electronically Signed   By: Julian Hy M.D.   On: 02/11/2021 10:24 Note: Reviewed        Physical Exam  General appearance: Well nourished, well developed, and well hydrated. In no apparent acute distress Mental status: Alert, oriented x 3 (person, place, & time)       Respiratory: No evidence of acute respiratory distress Eyes: PERLA Vitals: BP (!) 167/81 (BP Location: Right Arm, Patient Position: Sitting, Cuff Size: Large)   Pulse 66   Temp (!) 97.1 F (36.2 C) (Temporal)   Resp 16   Ht _0  (1.702 m)   Wt 277 lb (125.6 kg)   SpO2 98%   BMI 43.38 kg/m  BMI: Estimated body mass index is 43.38 kg/m as calculated from the following:   Height as of this encounter: _1  (1.702 m).   Weight as of this encounter: 277 lb (125.6 kg). Ideal: Ideal body weight: 66.1 kg (145 lb 11.6 oz) Adjusted ideal body weight: 89.9 kg (198 lb 3.7 oz)  Assessment   Status Diagnosis  Controlled Controlled Controlled 1. Chronic pain  syndrome   2. Chronic pain of left knee   3. Arthropathy of left knee   4. Chronic hip pain (1ry area of Pain) (Left)   5. Pharmacologic therapy   6. Chronic use of opiate for therapeutic purpose   7. Chronic anticoagulation (Plavix)      Updated Problems: Problem  Right Calcaneal Fracture  Pvd (Peripheral Vascular Disease) ([facility])  Hip Fracture ([facility])  Chronic Hip Pain, Left   Status post ORIF with IM nail fixation of the proximal left femur. No definite acute osseous abnormality, however limited due to diffuse osteopenia.    Chronic Use of Opiate for Therapeutic Purpose  Weakness  Covid-19  Morbid Obesity ([facility])  Chronic Systolic Chf (Congestive Heart Failure) ([facility])  Essential Hypertension  Hyposmolality and/Or Hyponatremia  Hypothyroidism  Type 2 Diabetes Mellitus With Diabetic Chronic Kidney Disease ([facility])  Type 2 Diabetes Mellitus With Diabetic Peripheral Angiopathy Without Gangrene ([facility])    Plan of Care  Problem-specific:  No problem-specific Assessment & Plan notes found for this encounter.  Mr. DANILO CAPPIELLO has a current medication list which includes the following long-term medication(s): albuterol, amlodipine, atorvastatin, calcium carbonate, carvedilol, furosemide, gabapentin, ipratropium, isosorbide mononitrate, nitroglycerin, oxycodone, quetiapine, magnesium oxide, and [DISCONTINUED] vitamin d3.  Pharmacotherapy (Medications Ordered): Meds ordered this encounter  Medications   oxyCODONE (OXY IR/ROXICODONE) 5 MG immediate release tablet    Sig: Take 1 tablet (5 mg total) by mouth every 8 (eight) hours. Must last 30 days.    Dispense:  90 tablet    Refill:  0    Not a duplicate. Do NOT delete! Dispense 1 day early if closed on refill date. Avoid benzodiazepines within 8 hours of opioids. Do not send refill requests.    Orders:  Orders Placed This Encounter  Procedures  ToxASSURE Select 13 (MW), Urine    Volume: 30 ml(s). Minimum 3 ml of urine is  needed. Document temperature of fresh sample. Indications: Long term (current) use of opiate analgesic (L10.289)    Order Specific Question:   Release to patient    Answer:   Immediate    Follow-up plan:   Return in about 1 month (around 07/06/2021) for evaluation day (F2F) (MM).     Interventional Therapies  Risk  Complexity Considerations:   Class III morbid obesity (BMI 42.29 kg/m) Difficulty breathing and tolerating prone position NOTE: PLAVIX Anticoagulation (Stop: 7-10 days  Restart: 2 hours) Chronic CHF  CAD with history of MI  History of alcohol abuse  Chronic kidney disease stage III  COPD  OSA  Carotid artery disease  Abnormal heart rhythm with PVCs during procedure    Planned  Pending:   Possible series of 5 left knee Hyalgan injections    Under consideration:   Pending to order bilateral shoulder x-rays.  Possible need for neurosurgical evaluation.  We will also consider nerve conduction testing of the lower extremities. Diagnostic left L2-3 LESI #1  Diagnostic left L3-4 LESI #1  Diagnostic left L4-5 LESI #1  Diagnostic bilateral L2 TFESI #1  Diagnostic bilateral L3 TFESI #1  Diagnostic bilateral L4 TFESI #1  Diagnostic bilateral lumbar facet block #1  Diagnostic bilateral femoral nerve and obturator nerve block #1  Diagnostic bilateral IA knee joint injection (steroid) #1  Possible series of bilateral IA Hyalgan knee injections  Diagnostic bilateral genicular nerve block #1    Completed:   Diagnostic left L2-3 LESI x1 (11/16/2020) (100/100/100 x2 days/<50) Diagnostic/therapeutic left IA hip injection x2 (11/30/2020) (12/23/2020) Diagnostic/therapeutic left subgluteus maximus bursa injection x1 (11/30/2020)  Therapeutic left IA Hyalgan knee injection x1 (12/23/2020)   Palliative options:   Therapeutic left L2-3 LESI #2     Recent Visits No visits were found meeting these conditions. Showing recent visits within past 90 days and meeting all other  requirements Today's Visits Date Type Provider Dept  06/06/21 Office Visit Milinda Pointer, MD [facility]  Showing today's visits and meeting all other requirements Future Appointments No visits were found meeting these conditions. Showing future appointments within next 90 days and meeting all other requirements I discussed the assessment and treatment plan with the patient. The patient was provided an opportunity to ask questions and all were answered. The patient agreed with the plan and demonstrated an understanding of the instructions.  Patient advised to call back or seek an in-person evaluation if the symptoms or condition worsens.  Duration of encounter: 30 minutes.  Note by: Gaspar Cola, MD Date: 06/06/2021; Time: 11:21 AM

## 2021-06-06 ENCOUNTER — Other Ambulatory Visit: Payer: Self-pay

## 2021-06-06 ENCOUNTER — Ambulatory Visit: Payer: HMO | Attending: Pain Medicine | Admitting: Pain Medicine

## 2021-06-06 ENCOUNTER — Encounter: Payer: Self-pay | Admitting: Pain Medicine

## 2021-06-06 VITALS — BP 167/81 | HR 66 | Temp 97.1°F | Resp 16 | Ht 67.0 in | Wt 277.0 lb

## 2021-06-06 DIAGNOSIS — Z79891 Long term (current) use of opiate analgesic: Secondary | ICD-10-CM | POA: Insufficient documentation

## 2021-06-06 DIAGNOSIS — G8929 Other chronic pain: Secondary | ICD-10-CM | POA: Diagnosis present

## 2021-06-06 DIAGNOSIS — G894 Chronic pain syndrome: Secondary | ICD-10-CM

## 2021-06-06 DIAGNOSIS — Z7901 Long term (current) use of anticoagulants: Secondary | ICD-10-CM | POA: Diagnosis present

## 2021-06-06 DIAGNOSIS — M25552 Pain in left hip: Secondary | ICD-10-CM | POA: Diagnosis not present

## 2021-06-06 DIAGNOSIS — M25562 Pain in left knee: Secondary | ICD-10-CM | POA: Diagnosis not present

## 2021-06-06 DIAGNOSIS — M1712 Unilateral primary osteoarthritis, left knee: Secondary | ICD-10-CM | POA: Diagnosis not present

## 2021-06-06 DIAGNOSIS — Z79899 Other long term (current) drug therapy: Secondary | ICD-10-CM | POA: Insufficient documentation

## 2021-06-06 MED ORDER — OXYCODONE HCL 5 MG PO TABS: 5.0000 mg | ORAL_TABLET | Freq: Three times a day (TID) | ORAL | 0 refills | Status: DC

## 2021-06-06 NOTE — Patient Instructions (Addendum)
____________________________________________________________________________________________  Medication Rules  Purpose: To inform patients, and their family members, of our rules and regulations.  Applies to: All patients receiving prescriptions (written or electronic).  Pharmacy of record: Pharmacy where electronic prescriptions will be sent. If written prescriptions are taken to a different pharmacy, please inform the nursing staff. The pharmacy listed in the electronic medical record should be the one where you would like electronic prescriptions to be sent.  Electronic prescriptions: In compliance with the [place] Strengthen Opioid Misuse Prevention (STOP) Act of 2017 (Session Law 2017-74/H243), effective December 18, 2018, all controlled substances must be electronically prescribed. Calling prescriptions to the pharmacy will cease to exist.  Prescription refills: Only during scheduled appointments. Applies to all prescriptions.  NOTE: The following applies primarily to controlled substances (Opioid* Pain Medications).   Type of encounter (visit): For patients receiving controlled substances, face-to-face visits are required. (Not an option or up to the patient.)  Patient's responsibilities: Pain Pills: Bring all pain pills to every appointment (except for procedure appointments). Pill Bottles: Bring pills in original pharmacy bottle. Always bring the newest bottle. Bring bottle, even if empty. Medication refills: You are responsible for knowing and keeping track of what medications you take and those you need refilled. The day before your appointment: write a list of all prescriptions that need to be refilled. The day of the appointment: give the list to the admitting nurse. Prescriptions will be written only during appointments. No prescriptions will be written on procedure days. If you forget a medication: it will not be "Called in", "Faxed", or "electronically sent". You will  need to get another appointment to get these prescribed. No early refills. Do not call asking to have your prescription filled early. Prescription Accuracy: You are responsible for carefully inspecting your prescriptions before leaving our office. Have the discharge nurse carefully go over each prescription with you, before taking them home. Make sure that your name is accurately spelled, that your address is correct. Check the name and dose of your medication to make sure it is accurate. Check the number of pills, and the written instructions to make sure they are clear and accurate. Make sure that you are given enough medication to last until your next medication refill appointment. Taking Medication: Take medication as prescribed. When it comes to controlled substances, taking less pills or less frequently than prescribed is permitted and encouraged. Never take more pills than instructed. Never take medication more frequently than prescribed.  Inform other Doctors: Always inform, all of your healthcare providers, of all the medications you take. Pain Medication from other Providers: You are not allowed to accept any additional pain medication from any other Doctor or Healthcare provider. There are two exceptions to this rule. (see below) In the event that you require additional pain medication, you are responsible for notifying us, as stated below. Cough Medicine: Often these contain an opioid, such as codeine or hydrocodone. Never accept or take cough medicine containing these opioids if you are already taking an opioid* medication. The combination may cause respiratory failure and death. Medication Agreement: You are responsible for carefully reading and following our Medication Agreement. This must be signed before receiving any prescriptions from our practice. Safely store a copy of your signed Agreement. Violations to the Agreement will result in no further prescriptions. (Additional copies of our  Medication Agreement are available upon request.) Laws, Rules, & Regulations: All patients are expected to follow all Federal and State Laws, Statutes, Rules, & Regulations. Ignorance of   the Laws does not constitute a valid excuse.  Illegal drugs and Controlled Substances: The use of illegal substances (including, but not limited to marijuana and its derivatives) and/or the illegal use of any controlled substances is strictly prohibited. Violation of this rule may result in the immediate and permanent discontinuation of any and all prescriptions being written by our practice. The use of any illegal substances is prohibited. Adopted CDC guidelines & recommendations: Target dosing levels will be at or below 60 MME/day. Use of benzodiazepines** is not recommended.  Exceptions: There are only two exceptions to the rule of not receiving pain medications from other Healthcare Providers. Exception #1 (Emergencies): In the event of an emergency (i.e.: accident requiring emergency care), you are allowed to receive additional pain medication. However, you are responsible for: As soon as you are able, call our office [phone], at any time of the day or night, and leave a message stating your name, the date and nature of the emergency, and the name and dose of the medication prescribed. In the event that your call is answered by a member of our staff, make sure to document and save the date, time, and the name of the person that took your information.  Exception #2 (Planned Surgery): In the event that you are scheduled by another doctor or dentist to have any type of surgery or procedure, you are allowed (for a period no longer than 30 days), to receive additional pain medication, for the acute post-op pain. However, in this case, you are responsible for picking up a copy of our "Post-op Pain Management for Surgeons" handout, and giving it to your surgeon or dentist. This document is available at our office, and  does not require an appointment to obtain it. Simply go to our office during business hours (Monday-Thursday from 8:00 AM to 4:00 PM) (Friday 8:00 AM to 12:00 Noon) or if you have a scheduled appointment with us, prior to your surgery, and ask for it by name. In addition, you are responsible for: calling our office [phone], at any time of the day or night, and leaving a message stating your name, name of your surgeon, type of surgery, and date of procedure or surgery. Failure to comply with your responsibilities may result in termination of therapy involving the controlled substances.  *Opioid medications include: morphine, codeine, oxycodone, oxymorphone, hydrocodone, hydromorphone, meperidine, tramadol, tapentadol, buprenorphine, fentanyl, methadone. **Benzodiazepine medications include: diazepam (Valium), alprazolam (Xanax), clonazepam (Klonopine), lorazepam (Ativan), clorazepate (Tranxene), chlordiazepoxide (Librium), estazolam (Prosom), oxazepam (Serax), temazepam (Restoril), triazolam (Halcion) (Last updated: 11/15/2020) ____________________________________________________________________________________________  ____________________________________________________________________________________________  Medication Recommendations and Reminders  Applies to: All patients receiving prescriptions (written and/or electronic).  Medication Rules & Regulations: These rules and regulations exist for your safety and that of others. They are not flexible and neither are we. Dismissing or ignoring them will be considered "non-compliance" with medication therapy, resulting in complete and irreversible termination of such therapy. (See document titled "Medication Rules" for more details.) In all conscience, because of safety reasons, we cannot continue providing a therapy where the patient does not follow instructions.  Pharmacy of record:  Definition: This is the pharmacy where your electronic  prescriptions will be sent.  We do not endorse any particular pharmacy, however, we have experienced problems with [facility] not securing enough medication supply for the community. We do not restrict you in your choice of pharmacy. However, once we write for your prescriptions, we will NOT be re-sending more prescriptions to fix restricted supply problems   created by your pharmacy, or your insurance.  The pharmacy listed in the electronic medical record should be the one where you want electronic prescriptions to be sent. If you choose to change pharmacy, simply notify our nursing staff.  Recommendations: Keep all of your pain medications in a safe place, under lock and key, even if you live alone. We will NOT replace lost, stolen, or damaged medication. After you fill your prescription, take 1 week's worth of pills and put them away in a safe place. You should keep a separate, properly labeled bottle for this purpose. The remainder should be kept in the original bottle. Use this as your primary supply, until it runs out. Once it's gone, then you know that you have 1 week's worth of medicine, and it is time to come in for a prescription refill. If you do this correctly, it is unlikely that you will ever run out of medicine. To make sure that the above recommendation works, it is very important that you make sure your medication refill appointments are scheduled at least 1 week before you run out of medicine. To do this in an effective manner, make sure that you do not leave the office without scheduling your next medication management appointment. Always ask the nursing staff to show you in your prescription , when your medication will be running out. Then arrange for the receptionist to get you a return appointment, at least 7 days before you run out of medicine. Do not wait until you have 1 or 2 pills left, to come in. This is very poor planning and does not take into consideration that we may need to  cancel appointments due to bad weather, sickness, or emergencies affecting our staff. DO NOT ACCEPT A "Partial Fill": If for any reason your pharmacy does not have enough pills/tablets to completely fill or refill your prescription, do not allow for a "partial fill". The law allows the pharmacy to complete that prescription within 72 hours, without requiring a new prescription. If they do not fill the rest of your prescription within those 72 hours, you will need a separate prescription to fill the remaining amount, which we will NOT provide. If the reason for the partial fill is your insurance, you will need to talk to the pharmacist about payment alternatives for the remaining tablets, but again, DO NOT ACCEPT A PARTIAL FILL, unless you can trust your pharmacist to obtain the remainder of the pills within 72 hours.  Prescription refills and/or changes in medication(s):  Prescription refills, and/or changes in dose or medication, will be conducted only during scheduled medication management appointments. (Applies to both, written and electronic prescriptions.) No refills on procedure days. No medication will be changed or started on procedure days. No changes, adjustments, and/or refills will be conducted on a procedure day. Doing so will interfere with the diagnostic portion of the procedure. No phone refills. No medications will be "called into the pharmacy". No Fax refills. No weekend refills. No Holliday refills. No after hours refills.  Remember:  Business hours are:  Monday to Thursday 8:00 AM to 4:00 PM Provider's Schedule: Marsella Suman, MD - Appointments are:  Medication management: Monday and Wednesday 8:00 AM to 4:00 PM Procedure day: Tuesday and Thursday 7:30 AM to 4:00 PM Bilal Lateef, MD - Appointments are:  Medication management: Tuesday and Thursday 8:00 AM to 4:00 PM Procedure day: Monday and Wednesday 7:30 AM to 4:00 PM (Last update:  07/07/2020) ____________________________________________________________________________________________  ____________________________________________________________________________________________  CBD (cannabidiol) WARNING    Applicable to: All individuals currently taking or considering taking CBD (cannabidiol) and, more important, all patients taking opioid analgesic controlled substances (pain medication). (Example: oxycodone; oxymorphone; hydrocodone; hydromorphone; morphine; methadone; tramadol; tapentadol; fentanyl; buprenorphine; butorphanol; dextromethorphan; meperidine; codeine; etc.)  Legal status: CBD remains a Schedule I drug prohibited for any use. CBD is illegal with one exception. In the [place], CBD has a limited Food and Drug Administration (FDA) approval for the treatment of two specific types of epilepsy disorders. Only one CBD product has been approved by the FDA for this purpose: "Epidiolex". FDA is aware that some companies are marketing products containing cannabis and cannabis-derived compounds in ways that violate the Federal Food, Drug and Cosmetic Act (FD&C Act) and that may put the health and safety of consumers at risk. The FDA, a Federal agency, has not enforced the CBD status since 2018.   Legality: Some manufacturers ship CBD products nationally, which is illegal. Often such products are sold online and are therefore available throughout the country. CBD is openly sold in head shops and health food stores in some states where such sales have not been explicitly legalized. Selling unapproved products with unsubstantiated therapeutic claims is not only a violation of the law, but also can put patients at risk, as these products have not been proven to be safe or effective. Federal illegality makes it difficult to conduct research on CBD.  Reference: "FDA Regulation of Cannabis and Cannabis-Derived Products, Including Cannabidiol (CBD)" -  https://www.fda.gov/news-events/public-health-focus/fda-regulation-cannabis-and-cannabis-derived-products-including-cannabidiol-cbd  Warning: CBD is not FDA approved and has not undergo the same manufacturing controls as prescription drugs.  This means that the purity and safety of available CBD may be questionable. Most of the time, despite manufacturer's claims, it is contaminated with THC (delta-9-tetrahydrocannabinol - the chemical in marijuana responsible for the "HIGH").  When this is the case, the THC contaminant will trigger a positive urine drug screen (UDS) test for Marijuana (carboxy-THC). Because a positive UDS for any illicit substance is a violation of our medication agreement, your opioid analgesics (pain medicine) may be permanently discontinued.  MORE ABOUT CBD  General Information: CBD  is a derivative of the Marijuana (cannabis sativa) plant discovered in 1940. It is one of the 113 identified substances found in Marijuana. It accounts for up to 40% of the plant's extract. As of 2018, preliminary clinical studies on CBD included research for the treatment of anxiety, movement disorders, and pain. CBD is available and consumed in multiple forms, including inhalation of smoke or vapor, as an aerosol spray, and by mouth. It may be supplied as an oil containing CBD, capsules, dried cannabis, or as a liquid solution. CBD is thought not to be as psychoactive as THC (delta-9-tetrahydrocannabinol - the chemical in marijuana responsible for the "HIGH"). Studies suggest that CBD may interact with different biological target receptors in the body, including cannabinoid and other neurotransmitter receptors. As of 2018 the mechanism of action for its biological effects has not been determined.  Side-effects  Adverse reactions: Dry mouth, diarrhea, decreased appetite, fatigue, drowsiness, malaise, weakness, sleep disturbances, and others.  Drug interactions: CBC may interact with other medications  such as blood-thinners. (Last update: 07/24/2020) ____________________________________________________________________________________________  ____________________________________________________________________________________________  Drug Holidays (Slow)  What is a "Drug Holiday"? Drug Holiday: is the name given to the period of time during which a patient stops taking a medication(s) for the purpose of eliminating tolerance to the drug.  Benefits Improved effectiveness of opioids. Decreased opioid dose needed to achieve benefits. Improved pain with lesser dose.    What is tolerance? Tolerance: is the progressive decreased in effectiveness of a drug due to its repetitive use. With repetitive use, the body gets use to the medication and as a consequence, it loses its effectiveness. This is a common problem seen with opioid pain medications. As a result, a larger dose of the drug is needed to achieve the same effect that used to be obtained with a smaller dose.  How long should a "Drug Holiday" last? You should stay off of the pain medicine for at least 14 consecutive days. (2 weeks)  Should I stop the medicine "cold [place]"? No. You should always coordinate with your Pain Specialist so that he/she can provide you with the correct medication dose to make the transition as smoothly as possible.  How do I stop the medicine? Slowly. You will be instructed to decrease the daily amount of pills that you take by one (1) pill every seven (7) days. This is called a "slow downward taper" of your dose. For example: if you normally take four (4) pills per day, you will be asked to drop this dose to three (3) pills per day for seven (7) days, then to two (2) pills per day for seven (7) days, then to one (1) per day for seven (7) days, and at the end of those last seven (7) days, this is when the "Drug Holiday" would start.   Will I have withdrawals? By doing a "slow downward taper" like this one, it  is unlikely that you will experience any significant withdrawal symptoms. Typically, what triggers withdrawals is the sudden stop of a high dose opioid therapy. Withdrawals can usually be avoided by slowly decreasing the dose over a prolonged period of time. If you do not follow these instructions and decide to stop your medication abruptly, withdrawals may be possible.  What are withdrawals? Withdrawals: refers to the wide range of symptoms that occur after stopping or dramatically reducing opiate drugs after heavy and prolonged use. Withdrawal symptoms do not occur to patients that use low dose opioids, or those who take the medication sporadically. Contrary to benzodiazepine (example: Valium, Xanax, etc.) or alcohol withdrawals ("Delirium Tremens"), opioid withdrawals are not lethal. Withdrawals are the physical manifestation of the body getting rid of the excess receptors.  Expected Symptoms Early symptoms of withdrawal may include: Agitation Anxiety Muscle aches Increased tearing Insomnia Runny nose Sweating Yawning  Late symptoms of withdrawal may include: Abdominal cramping Diarrhea Dilated pupils Goose bumps Nausea Vomiting  Will I experience withdrawals? Due to the slow nature of the taper, it is very unlikely that you will experience any.  What is a slow taper? Taper: refers to the gradual decrease in dose.  (Last update: 07/07/2020) ____________________________________________________________________________________________   ____________________________________________________________________________________________  Medication Evaluation  Purpose: The purpose of these questions is to establish the onset of effects (speed of absorption), peak benefit (effectiveness), side-effects (ability to tolerate), and duration (excretion/metabolism) of the prescribed medication. The results will help the healthcare provider decide what to do with the medication in  question.  Please indicate:  Time to onset of benefits: The amount of time it takes for you to begin perceiving any benefits after taking (swallowing) your pain medicine. _______ minutes.  Time to peak effect: The time it takes between taking medicine (swallowing it) and the moment when you feel the most benefit (peak effect) from the medicine. _______ minutes.  Peak benefit: Please quantify the amount of relief you obtain from the medicine at the moment it is  working the best. Does you pain completely go away (100% gone)? Is it three quarters (3/4) better (75% relief)? Does half of your pain go away (50% benefit)? Is it a third better (33% improved)? Or does it go down only by a fourth (25% better)? _______ % relief.  Duration of benefit: The time it takes for all benefits of the medicine to be gone. This period of time starts when you first swallow your pain pill and ends when you feel that your pain has increased to the point where you absolutely need to take another pill. _______ hours and _______ minutes.  Adverse reactions: These are side effects and/or adverse reactions you experience since taking the medicine or only when taking your pain medicine. Please circle any and all that apply:  Allergic reactions: itching; hives; generalized redness; swelling of the tongue; swelling of the eyes; difficulty breathing. Neurological Intolerance: cognitive impairment (difficulty thinking clearly; memory problems; difficulty remembering things; slurred speech); oversedation; sleepiness: unsteadiness; difficulty walking. Gastrointestinal problems: constipation; nausea; vomiting; dry heaves; decreased appetite. Hormonal problems: decreased sex drive; erection problems; abnormal menstrual period; weight gain or inability to lose weight; weakness or low energy; hair loss.  What to do: When completed, please bring back to your next visit. Please give it to the admitting nurse.  (Last updated:  05/01/2019) ____________________________________________________________________________________________

## 2021-06-09 ENCOUNTER — Telehealth: Payer: Self-pay | Admitting: Pain Medicine

## 2021-06-09 NOTE — Telephone Encounter (Signed)
Patients' daughter called asking if Dr. Dossie Arbour will write a note stating that [name] would benefit from being moved into a handicap accessible appartment. There is one coming open and they would like to get him into it. Please cal daughter and let her know if this can be done.   I called her and let her know it would be Tues before we could find out anything.

## 2021-06-14 LAB — TOXASSURE SELECT 13 (MW), URINE

## 2021-06-14 NOTE — Telephone Encounter (Signed)
I have bubbled Dr. Dossie Arbour about this to see if he will be willing to do this.

## 2021-06-20 ENCOUNTER — Other Ambulatory Visit: Payer: Self-pay

## 2021-06-20 ENCOUNTER — Emergency Department: Payer: HMO

## 2021-06-20 ENCOUNTER — Emergency Department
Admission: EM | Admit: 2021-06-20 | Discharge: 2021-06-20 | Disposition: A | Discharge status: Home / Self Care | Payer: HMO | Attending: Emergency Medicine | Admitting: Emergency Medicine

## 2021-06-20 ENCOUNTER — Encounter: Payer: Self-pay | Admitting: Emergency Medicine

## 2021-06-20 DIAGNOSIS — I5042 Chronic combined systolic (congestive) and diastolic (congestive) heart failure: Secondary | ICD-10-CM | POA: Diagnosis not present

## 2021-06-20 DIAGNOSIS — E1122 Type 2 diabetes mellitus with diabetic chronic kidney disease: Secondary | ICD-10-CM | POA: Diagnosis not present

## 2021-06-20 DIAGNOSIS — I130 Hypertensive heart and chronic kidney disease with heart failure and stage 1 through stage 4 chronic kidney disease, or unspecified chronic kidney disease: Secondary | ICD-10-CM | POA: Insufficient documentation

## 2021-06-20 DIAGNOSIS — Z79899 Other long term (current) drug therapy: Secondary | ICD-10-CM | POA: Diagnosis not present

## 2021-06-20 DIAGNOSIS — I2510 Atherosclerotic heart disease of native coronary artery without angina pectoris: Secondary | ICD-10-CM | POA: Insufficient documentation

## 2021-06-20 DIAGNOSIS — Z8546 Personal history of malignant neoplasm of prostate: Secondary | ICD-10-CM | POA: Insufficient documentation

## 2021-06-20 DIAGNOSIS — R0789 Other chest pain: Secondary | ICD-10-CM | POA: Insufficient documentation

## 2021-06-20 DIAGNOSIS — J45909 Unspecified asthma, uncomplicated: Secondary | ICD-10-CM | POA: Diagnosis not present

## 2021-06-20 DIAGNOSIS — E039 Hypothyroidism, unspecified: Secondary | ICD-10-CM | POA: Diagnosis not present

## 2021-06-20 DIAGNOSIS — Z8616 Personal history of COVID-19: Secondary | ICD-10-CM | POA: Diagnosis not present

## 2021-06-20 DIAGNOSIS — N184 Chronic kidney disease, stage 4 (severe): Secondary | ICD-10-CM | POA: Insufficient documentation

## 2021-06-20 DIAGNOSIS — Z7902 Long term (current) use of antithrombotics/antiplatelets: Secondary | ICD-10-CM | POA: Diagnosis not present

## 2021-06-20 DIAGNOSIS — Z87891 Personal history of nicotine dependence: Secondary | ICD-10-CM | POA: Insufficient documentation

## 2021-06-20 DIAGNOSIS — J449 Chronic obstructive pulmonary disease, unspecified: Secondary | ICD-10-CM | POA: Insufficient documentation

## 2021-06-20 DIAGNOSIS — Z96641 Presence of right artificial hip joint: Secondary | ICD-10-CM | POA: Diagnosis not present

## 2021-06-20 DIAGNOSIS — E1151 Type 2 diabetes mellitus with diabetic peripheral angiopathy without gangrene: Secondary | ICD-10-CM | POA: Diagnosis not present

## 2021-06-20 DIAGNOSIS — E1129 Type 2 diabetes mellitus with other diabetic kidney complication: Secondary | ICD-10-CM | POA: Diagnosis not present

## 2021-06-20 DIAGNOSIS — Z955 Presence of coronary angioplasty implant and graft: Secondary | ICD-10-CM | POA: Diagnosis not present

## 2021-06-20 LAB — HEPATIC FUNCTION PANEL
ALT: 9 U/L (ref 0–44)
AST: 18 U/L (ref 15–41)
Albumin: 3.5 g/dL (ref 3.5–5.0)
Alkaline Phosphatase: 123 U/L (ref 38–126)
Bilirubin, Direct: 0.1 mg/dL (ref 0.0–0.2)
Indirect Bilirubin: 0.5 mg/dL (ref 0.3–0.9)
Total Bilirubin: 0.6 mg/dL (ref 0.3–1.2)
Total Protein: 8 g/dL (ref 6.5–8.1)

## 2021-06-20 LAB — CBC
HCT: 31 % — ABNORMAL LOW (ref 39.0–52.0)
Hemoglobin: 10.3 g/dL — ABNORMAL LOW (ref 13.0–17.0)
MCH: 27.7 pg (ref 26.0–34.0)
MCHC: 33.2 g/dL (ref 30.0–36.0)
MCV: 83.3 fL (ref 80.0–100.0)
Platelets: 323 10*3/uL (ref 150–400)
RBC: 3.72 MIL/uL — ABNORMAL LOW (ref 4.22–5.81)
RDW: 14.3 % (ref 11.5–15.5)
WBC: 6.6 10*3/uL (ref 4.0–10.5)
nRBC: 0 % (ref 0.0–0.2)

## 2021-06-20 LAB — BRAIN NATRIURETIC PEPTIDE: B Natriuretic Peptide: 147.7 pg/mL — ABNORMAL HIGH (ref 0.0–100.0)

## 2021-06-20 LAB — BASIC METABOLIC PANEL
Anion gap: 14 (ref 5–15)
BUN: 26 mg/dL — ABNORMAL HIGH (ref 8–23)
CO2: 19 mmol/L — ABNORMAL LOW (ref 22–32)
Calcium: 8.7 mg/dL — ABNORMAL LOW (ref 8.9–10.3)
Chloride: 96 mmol/L — ABNORMAL LOW (ref 98–111)
Creatinine, Ser: 2.19 mg/dL — ABNORMAL HIGH (ref 0.61–1.24)
GFR, Estimated: 29 mL/min — ABNORMAL LOW (ref >60)
Glucose, Bld: 109 mg/dL — ABNORMAL HIGH (ref 70–99)
Potassium: 4.1 mmol/L (ref 3.5–5.1)
Sodium: 129 mmol/L — ABNORMAL LOW (ref 135–145)

## 2021-06-20 LAB — TROPONIN I (HIGH SENSITIVITY)
Troponin I (High Sensitivity): 16 ng/L (ref <18)
Troponin I (High Sensitivity): 15 ng/L (ref <18)

## 2021-06-20 MED ORDER — OXYCODONE-ACETAMINOPHEN 5-325 MG PO TABS
1.0000 | ORAL_TABLET | Freq: Once | ORAL | Status: AC
Start: 2021-06-20 — End: 2021-06-20
  Administered 2021-06-20: 1 via ORAL
  Filled 2021-06-20: qty 1

## 2021-06-20 NOTE — ED Provider Notes (Signed)
[facility] Emergency Department Provider Note  ____________________________________________   Event Date/Time   First MD Initiated Contact with Patient 06/20/21 [phone]     (approximate)  I have reviewed the triage vital signs and the nursing notes.   HISTORY  Chief Complaint Chest Pain    HPI [name] is a 86 y.o. male with coronary disease, diabetes, hypertension, hyperlipidemia, CHF, OSA on CPAP, CKD, bipolar, alcohol abuse who comes in with chest pain.  Patient states that he had some chest pain that started last night, nonradiating, constant, moderate.  Denies any chest pain currently.  States that it all went away.  He states now he is just having his normal chronic pain from his prior hip fracture and joint pain.  Patient reports that he lives at home but has people coming into the house to help him get around.  Denies any shortness of breath or fevers.  Patient states that he is here just to make sure that his heart is okay.  He denies any alcohol or drugs today.   On review of records patient was admitted to the hospital on 2/25 and was COVID-positive          Past Medical History:  Diagnosis Date   Alcoholism ([place])    Asthma    Bipolar affective disorder ([place])    Chronic combined systolic and diastolic CHF (congestive heart failure) ([place])    a. 08/2016 Echo: EF 40-45%, mild AS, mild to mod MR, mildly dil LA/RA, mild-mod TR; b. 07/2017 Echo: EF 40-45%, mod LVH, Gr1 DD, mild to mod AS, mildly dil LA, nl RV fxn; c. 03/2019 Echo: EF 45-50%, AS (not severe). Mod dil PA.   CKD (chronic kidney disease), stage III (HCC)    COPD (chronic obstructive pulmonary disease) (HCC)    Coronary artery disease    a. 01/2004 s/p PCI and Taxus DES to dRCA (3.5 x 12 mm); b. 07/2017 Lexiscan MV: no ischemia. Sm area of apicl thinning, likely attenuation. EF 33% (GI uptake noted)-->Low risk; c. 03/2019 Inf STEMI/PCI: LM nl, LAD min irregs, D1 20ost, RI 20ost, LCX nl,  RCA 90p/32m (4.0x26 Resolute Onyx DES), 68m (4.0x15 Resolute Onyx DES), 10d ISR.   Degenerative joint disease    knees and hip   Dyspnea    on exertion   Essential hypertension    Hyperlipidemia    Hypertension    controlled on meds   Ischemic cardiomyopathy    a. 08/2016 Echo: EF 40-45%; b. 07/2017 Echo: EF 40-45%; c. 03/2019 Echo: EF 45-50%.   OSA (obstructive sleep apnea)    Prostate CA (HCC)    prostate ca dx 20 yrs ago   PVD (peripheral vascular disease) ([place])    Tobacco abuse     Patient Active Problem List   Diagnosis Date Noted   Chronic use of opiate for therapeutic purpose 06/06/2021   Essential hypertension 04/15/2021   Hyposmolality and/or hyponatremia 04/15/2021   Hypothyroidism 04/15/2021   Type 2 diabetes mellitus with diabetic chronic kidney disease ([employer]) 04/15/2021   Type 2 diabetes mellitus with diabetic peripheral angiopathy without gangrene ([place]) 04/15/2021   Weakness 02/11/2021   COVID-19 02/11/2021   Morbid obesity ([place]) 01/10/2021   Right calcaneal fracture 01/10/2021   PVD (peripheral vascular disease) ([place]) 51/01/5851   Chronic systolic CHF (congestive heart failure) ([place]) 01/10/2021   Hip fracture ([place]) 01/08/2021   Primary osteoarthritis of left knee 01/06/2021   Arthropathy of left knee 01/06/2021   Chronic pain  of left knee 01/06/2021   Class 3 obesity with alveolar hypoventilation and body mass index (BMI) of 40.0 to 44.9 in adult [facility]) 12/23/2020   Hip joint effusion (Left) 12/23/2020   History of alcohol abuse 11/29/2020   Elevated rheumatoid factor 11/29/2020   Polyarthritis with positive rheumatoid factor ([place]) 11/17/2020   Elevated uric acid in blood 11/17/2020   CKD (chronic kidney disease), stage IV (HCC) 10/26/2020   Proteinuria, unspecified 10/26/2020   Elevated BUN 10/26/2020   Elevated serum creatinine 10/26/2020   Hypochloremia 10/26/2020   Hypocalcemia 10/26/2020   Hypoalbuminemia 10/26/2020   Elevated alkaline phosphatase level  10/26/2020   Elevated hemoglobin A1c 10/26/2020   Elevated random blood glucose level 10/26/2020   Anemia in stage 3b chronic kidney disease ([place]) 10/26/2020    Klippel-Feil deformity at the C5-C7 levels 10/26/2020   Carotid artery calcification, bilateral 10/26/2020   History of 2.0 cm thyroid mass (Left lobe) 10/26/2020    Class: History of   Bilateral renal cysts 10/26/2020   Lumbosacral spondylosis 10/26/2020   Osteoarthritis of facet joint of lumbar spine 10/26/2020   DDD (degenerative disc disease), lumbosacral 10/26/2020   History of total hip replacement (Right) 10/26/2020   Osteoarthritis of hip (Left) 10/26/2020   Diverticulosis of sigmoid colon 10/26/2020   Abnormal MRI, lumbar spine (10/07/2020) 10/26/2020   Lumbar central spinal stenosis with neurogenic claudication 10/26/2020   Lumbar foraminal stenosis 10/26/2020   Elevated C-reactive protein (CRP) 10/25/2020   Elevated sed rate 10/25/2020   Vitamin D deficiency 10/25/2020   Chronic anticoagulation (Plavix) 09/20/2020   Chronic hip pain, left 09/20/2020   Unilateral post-traumatic osteoarthritis of hip (Left) 09/20/2020   Chronic knee pain (2ry area of Pain) (Bilateral) 09/20/2020   Secondary osteoarthritis of knee (Bilateral) 09/20/2020   Osteoarthritis of knees (Bilateral) 09/20/2020   Chronic constipation 09/20/2020   Chronic shoulder pain (3ry area of Pain) (Bilateral) 09/20/2020   Grade 1 (3 mm) anterolisthesis of lumbar spine (L4-5) (stable) 09/20/2020   Chronic groin pain (Left) 09/20/2020   Other intervertebral disc degeneration, lumbar region 09/20/2020   Neurogenic pain 09/20/2020   Chronic pain syndrome 09/18/2020   Pharmacologic therapy 09/18/2020   Disorder of skeletal system 09/18/2020   Problems influencing health status 09/18/2020   Lower extremity pain, bilateral 05/15/2018   Elevated troponin I level 07/31/2017   Hyperkalemia 05/28/2017   Syncope 05/27/2017   Meningioma ([place]) 01/05/2017   GI  bleed 12/01/2016   Alcohol abuse 10/30/2016   COPD (chronic obstructive pulmonary disease) with chronic bronchitis ([place]) 10/04/2016   Obstructive sleep apnea 10/04/2016   Hyponatremia 08/31/2016   Shortness of breath 02/11/2016   Hyperlipidemia 12/03/2015   Coronary artery disease 01/19/2004    Past Surgical History:  Procedure Laterality Date   CARDIAC CATHETERIZATION     CORONARY ANGIOGRAPHY N/A 04/01/2019   Procedure: CORONARY ANGIOGRAPHY;  Surgeon: Nelva Bush, MD;  Location: [facility];  Service: Cardiovascular;  Laterality: N/A;   CORONARY ANGIOPLASTY WITH STENT PLACEMENT     x2   CORONARY/GRAFT ACUTE MI REVASCULARIZATION N/A 04/01/2019   Procedure: Coronary/Graft Acute MI Revascularization;  Surgeon: Nelva Bush, MD;  Location: [facility];  Service: Cardiovascular;  Laterality: N/A;   TOTAL HIP ARTHROPLASTY     right    Prior to Admission medications   Medication Sig Start Date End Date Taking? Authorizing Provider  ADULT ASPIRIN REGIMEN 81 MG EC tablet TAKE 1 TABLET BY MOUTH DAILY Patient taking differently: Take 81 mg by mouth daily.  03/08/20   [name], MD  albuterol (VENTOLIN HFA) 108 (90 Base) MCG/ACT inhaler USE 2 PUFFS EVERY 4 HOURS AS NEEDED Patient taking differently: Inhale 2 puffs into the lungs every 4 (four) hours as needed for shortness of breath. 10/14/19   Alisa Graff, FNP  amLODipine (NORVASC) 5 MG tablet Take 1 tablet (5 mg total) by mouth daily. 01/09/20   Alisa Graff, FNP  atorvastatin (LIPITOR) 80 MG tablet Take 0.5 tablets (40 mg total) by mouth daily. 07/19/20   [name], MD  azelastine (ASTELIN) 0.1 % nasal spray Place 1 spray into both nostrils daily as needed for allergies.    [provider]  calcium carbonate (OSCAL) 1500 (600 Ca) MG TABS tablet Take 1 tablet (1,500 mg total) by mouth 2 (two) times daily with a meal. 11/29/20 06/06/21  Milinda Pointer, MD  carvedilol (COREG) 6.25 MG tablet  TAKE 1 TABLET BY MOUTH TWICE A DAY 05/10/21   [name], MD  clopidogrel (PLAVIX) 75 MG tablet Take 1 tablet (75 mg total) by mouth daily. 10/28/19   [name], MD  feeding supplement (ENSURE ENLIVE / ENSURE PLUS) LIQD Take 237 mLs by mouth 3 (three) times daily between meals. 01/13/21   Lorella Nimrod, MD  fluticasone (FLONASE) 50 MCG/ACT nasal spray Place 2 sprays into both nostrils daily.    [provider]  furosemide (LASIX) 40 MG tablet TAKE 1 TABLET BY MOUTH DAILY AND TAKE ANADDITIONAL TABLET IN THE AFTERNOON AS NEEDED FOR WEIGHT GAIN AND LEG EDEMA 04/21/21   [name], MD  gabapentin (NEURONTIN) 300 MG capsule Take 1 capsule (300 mg total) by mouth 2 (two) times daily. 02/17/21   Mercy Riding, MD  ipratropium (ATROVENT) 0.03 % nasal spray Place 2 sprays into both nostrils daily. 05/07/20   [provider]  isosorbide mononitrate (IMDUR) 30 MG 24 hr tablet TAKE 1 TABLET BY MOUTH DAILY Patient taking differently: Take 30 mg by mouth daily. 12/30/19   [name], MD  Magnesium Oxide 500 MG CAPS Take 1 capsule (500 mg total) by mouth daily. 11/29/20 02/27/21  Milinda Pointer, MD  Multiple Vitamin (MULTIVITAMIN WITH MINERALS) TABS tablet Take 1 tablet by mouth daily. 01/13/21   Lorella Nimrod, MD  nitroGLYCERIN (NITROSTAT) 0.4 MG SL tablet Take 0.4 mg by mouth every 5 (five) minutes x 3 doses as needed for chest pain. As needed for chest pain 12/06/15   [provider]  oxyCODONE (OXY IR/ROXICODONE) 5 MG immediate release tablet Take 1 tablet (5 mg total) by mouth every 8 (eight) hours. Must last 30 days. 06/06/21 07/06/21  Milinda Pointer, MD  QUEtiapine (SEROQUEL) 50 MG tablet Take 50 mg by mouth at bedtime.    [provider]  Donnal Debar 200-62.5-25 MCG/INH AEPB Inhale 1 puff into the lungs daily.  04/08/20   [provider]  Cholecalciferol (VITAMIN D3) 125 MCG (5000 UT) CAPS Take 1 capsule (5,000 Units total) by mouth  daily with breakfast. Take along with calcium and magnesium. Patient not taking: Reported on 01/27/2021 11/29/20 01/28/21  Milinda Pointer, MD    Allergies Chlorthalidone, Ace inhibitors, Hydrochlorothiazide, Lisinopril, Losartan, Other, and Benazepril-hydrochlorothiazide  Family History  Problem Relation Age of Onset   Hypertension Mother    Hyperlipidemia Mother    Heart attack Mother    Hypertension Father    Hyperlipidemia Father    Heart attack Father    Prostate cancer Neg Hx    Bladder Cancer Neg  Hx    Kidney cancer Neg Hx     Social History Social History   Tobacco Use   Smoking status: Former    Packs/day: 1.00    Years: 50.00    Pack years: 50.00    Types: Cigarettes   Smokeless tobacco: Never  Vaping Use   Vaping Use: Never used  Substance Use Topics   Alcohol use: Not Currently   Drug use: No      Review of Systems Constitutional: No fever/chills Eyes: No visual changes. ENT: No sore throat. Cardiovascular: Positive chest pain Respiratory: Denies shortness of breath. Gastrointestinal: No abdominal pain.  No nausea, no vomiting.  No diarrhea.  No constipation. Genitourinary: Negative for dysuria. Musculoskeletal: Negative for back pain. Skin: Negative for rash. Neurological: Negative for headaches, focal weakness or numbness. All other ROS negative ____________________________________________   PHYSICAL EXAM:  VITAL SIGNS: ED Triage Vitals [06/20/21 0800]  Enc Vitals Group     BP (!) 170/80     Pulse Rate 71     Resp 20     Temp 98.1 F (36.7 C)     Temp Source Oral     SpO2 98 %     Weight 270 lb (122.5 kg)     Height 5\' 7"  (1.702 m)     Head Circumference      Peak Flow      Pain Score 7     Pain Loc      Pain Edu?      Excl. in [place]?     Constitutional: Alert and oriented. Well appearing and in no acute distress. Eyes: Conjunctivae are normal. EOMI. Head: Atraumatic. Nose: No congestion/rhinnorhea. Mouth/Throat: Mucous  membranes are moist.   Neck: No stridor. Trachea Midline. FROM Cardiovascular: Normal rate, regular rhythm. Grossly normal heart sounds.  Good peripheral circulation. Respiratory: Normal respiratory effort.  No retractions. Lungs CTAB. Gastrointestinal: Soft and nontender. No distention. No abdominal bruits.  Musculoskeletal: No lower extremity tenderness nor edema.  No joint effusions. Neurologic:  Normal speech and language. No gross focal neurologic deficits are appreciated.  Unable to lift the left leg up off the bed secondary to prior hip fracture.  Otherwise sensation intact throughout.  Strength intact Skin:  Skin is warm, dry and intact. No rash noted. Psychiatric: Mood and affect are normal. Speech and behavior are normal. GU: Deferred   ____________________________________________   LABS (all labs ordered are listed, but only abnormal results are displayed)  Labs Reviewed  BASIC METABOLIC PANEL - Abnormal; Notable for the following components:      Result Value   Sodium 129 (*)    Chloride 96 (*)    CO2 19 (*)    Glucose, Bld 109 (*)    BUN 26 (*)    Creatinine, Ser 2.19 (*)    Calcium 8.7 (*)    GFR, Estimated 29 (*)    All other components within normal limits  CBC - Abnormal; Notable for the following components:   RBC 3.72 (*)    Hemoglobin 10.3 (*)    HCT 31.0 (*)    All other components within normal limits  TROPONIN I (HIGH SENSITIVITY)  TROPONIN I (HIGH SENSITIVITY)   ____________________________________________   ED ECG REPORT I, Vanessa Skidmore, the attending physician, personally viewed and interpreted this ECG.  Normal sinus rate of 72, no ST elevation, no T wave versions, type I AV block, occasional PVC ____________________________________________  RADIOLOGY Robert Bellow, personally viewed and evaluated these  images (plain radiographs) as part of my medical decision making, as well as reviewing the written report by the radiologist.  ED MD  interpretation: No pneumonia  Official radiology report(s): DG Chest 2 View  Result Date: 06/20/2021 CLINICAL DATA:  Pt c/o non-radiating centralized CP since last night, per pt he is no longer having CP. Pt has been out of some his medications but doesn't know which he is out of. Pt does have a hx of L hip fx that inoperable. Hx of asthma, HTN, and COPD. EXAM: CHEST - 2 VIEW COMPARISON:  02/11/2021 FINDINGS: Cardiac silhouette is normal in size. No mediastinal or hilar masses or evidence of adenopathy. Right middle lobe opacity partly obscures the right hemidiaphragm, consistent with atelectasis and/or scarring, similar to the prior study. Remainder of the lungs is clear. No pleural effusion or pneumothorax. Skeletal structures are intact. IMPRESSION: No active cardiopulmonary disease. Electronically Signed   By: Lajean Manes M.D.   On: 06/20/2021 08:38    ____________________________________________   PROCEDURES  Procedure(s) performed (including Critical Care):  Procedures   ____________________________________________   INITIAL IMPRESSION / ASSESSMENT AND PLAN / ED COURSE   [name] was evaluated in Emergency Department on 06/20/2021 for the symptoms described in the history of present illness. He was evaluated in the context of the global COVID-19 pandemic, which necessitated consideration that the patient might be at risk for infection with the SARS-CoV-2 virus that causes COVID-19. Institutional protocols and algorithms that pertain to the evaluation of patients at risk for COVID-19 are in a state of rapid change based on information released by regulatory bodies including the CDC and federal and state organizations. These policies and algorithms were followed during the patient's care in the ED.    Most Likely DDx:  -MSK (atypical chest pain) but will get cardiac markers to evaluate for ACS given risk factors/age -Patient reports that his chest pain is now resolved.  Denies any  shortness of breath and not hypoxic to suggest PE.  Patient does report some aching in his joints and chronic hip pain we will give him 1 dose of his home oxycodone   DDx that was also considered d/t potential to cause harm, but was found less likely based on history and physical (as detailed above): -PNA (no fevers, cough but CXR to evaluate) -PNX (reassured with equal b/l breath sounds, CXR to evaluate) -Symptomatic anemia (will get H&H) -Pulmonary embolism as no sob at rest, not pleuritic in nature, no hypoxia -Aortic Dissection as no tearing pain and no radiation to the mid back, pulses equal -Pericarditis no rub on exam, EKG changes or hx to suggest dx -Tamponade (no notable SOB, tachycardic, hypotensive) -Esophageal rupture (no h/o diffuse vomitting/no crepitus)  Patient's sodium is slightly low but he has had prior low sodiums in the past.  He is asymptomatic from it.  Do not want to give him any fluid due to his history of CHF.  His kidney function is around his baseline.  His hemoglobin is around baseline.  No white count elevation to suggest infection.  His troponin went from 16-15 therefore unlikely to be ACS.  On repeat evaluation patient remains without chest pain and states "get me out of here.  It is cold in here".  Patient denies alcohol use and does not appear intoxicated.  He states that he is going to wait for a ride home but is requesting discharge home given his cardiac markers are reassuring.  He states that he has  been out of some of his home medication but he is going to get them tomorrow.  Denies needing prescriptions today.  I discussed the provisional nature of ED diagnosis, the treatment so far, the ongoing plan of care, follow up appointments and return precautions with the patient and any family or support people present. They expressed understanding and agreed with the plan, discharged home.          ____________________________________________   FINAL  CLINICAL IMPRESSION(S) / ED DIAGNOSES   Final diagnoses:  Atypical chest pain     MEDICATIONS GIVEN DURING THIS VISIT:  Medications  oxyCODONE-acetaminophen (PERCOCET/ROXICET) 5-325 MG per tablet 1 tablet (1 tablet Oral Given 06/20/21 1026)     ED Discharge Orders     None        Note:  This document was prepared using Dragon voice recognition software and may include unintentional dictation errors.    Vanessa Max, MD 06/20/21 1149

## 2021-06-20 NOTE — ED Triage Notes (Addendum)
Pt via EMS from home. Per EMS, pt c/o non-radiating centralized CP since last night, per pt he is no longer having CP. Pt has been out of some his medications but doesn't know which he is out of. Pt does have a hx of L hip fx that inoperable. EMS gave 324 ASA. All 4 of pt's extremities have edema, pt does have a hx of CHF. Pt is A&Ox4 and NAD.

## 2021-06-20 NOTE — Discharge Instructions (Signed)
Your work-up was reassuring and her cardiac markers were negative.  However if you develop return of your chest pain you should return to the ER.  Your blood pressure was slightly elevated you should have this follow-up with your primary doctor.  You should also discuss this chest pain episode with your cardiologist.  Please call them to make an appointment.  Return to the ER if develop worsening pain or any other concern

## 2021-06-21 ENCOUNTER — Telehealth: Payer: Self-pay | Admitting: Cardiovascular Disease

## 2021-06-21 NOTE — Telephone Encounter (Signed)
Letter sent.

## 2021-06-21 NOTE — Telephone Encounter (Signed)
Per [employer] call patient to discuss:   patient had a recent ED visit for cp and htn with no resolution.   Reached out to patient to discuss issues but patient declined to give additional information and wanted to just schedule an appt.   Scheduled 8-4 with Arida and added to wait list .    ?

## 2021-06-28 NOTE — Progress Notes (Signed)
PROVIDER NOTE: Information contained herein reflects review and annotations entered in association with encounter. Interpretation of such information and data should be left to medically-trained personnel. Information provided to patient can be located elsewhere in the medical record under "Patient Instructions". Document created using STT-dictation technology, any transcriptional errors that may result from process are unintentional.    Patient: [name]  Service Category: E/M  Provider: Gaspar Cola, MD  DOB: 1935/06/18  DOS: 06/29/2021  Specialty: Interventional Pain Management  MRN: [id number]  Setting: Ambulatory outpatient  PCP: Pcp, No  Type: Established Patient    Referring Provider: Marguerita Merles, MD  Location: Office  Delivery: Face-to-face     HPI  [name], a 86 y.o. year old male, is here today because of his Chronic pain syndrome [G89.4]. [name] primary complain today is Shoulder Pain (right), Hip Pain (left), and Knee Pain (bilateral) Last encounter: My last encounter with him was on 06/09/2021. Pertinent problems: [name] has Meningioma [facility]); Lower extremity pain (Bilateral); Chronic pain syndrome; Chronic hip pain (1ry area of Pain) (Left); Unilateral post-traumatic osteoarthritis of hip (Left); Chronic knee pain (2ry area of Pain) (Bilateral); Secondary osteoarthritis of knee (Bilateral); Osteoarthritis of knees (Bilateral); Chronic shoulder pain (3ry area of Pain) (Bilateral); Grade 1 (3 mm) anterolisthesis of lumbar spine (L4-5) (stable); Chronic groin pain (Left); Other intervertebral disc degeneration, lumbar region; Neurogenic pain; Klippel-Feil deformity at the C5-C7 levels; Lumbosacral spondylosis; Osteoarthritis of facet joint of lumbar spine; DDD (degenerative disc disease), lumbosacral; History of total hip replacement (Right); Osteoarthritis of hip (Left); Abnormal MRI, lumbar spine (10/07/2020); Lumbar central spinal stenosis with neurogenic  claudication; Lumbar foraminal stenosis; Polyarthritis with positive rheumatoid factor ([place]); Hip joint effusion (Left); Osteoarthritis of knee (Left); Arthropathy of knee (Left); Chronic knee pain (Left); Hip fracture ([place]); Calcaneal fracture (Right); PVD (peripheral vascular disease) ([place]); and Acute pain of right shoulder on their pertinent problem list. Pain Assessment: Severity of Chronic pain is reported as a 10-Worst pain ever/10. Location: Shoulder Right/ . Onset: More than a month ago. Quality: Aching. Timing: Constant. Modifying factor(s): nothing. Vitals:  height is '5\' 7"'  (1.702 m) and weight is 250 lb (113.4 kg). His temporal temperature is 97.1 F (36.2 C) (abnormal). His blood pressure is 123/58 (abnormal) and his pulse is 65. His respiration is 16 and oxygen saturation is 97%.   Reason for encounter: medication management.  [name] is an extremely difficult patient to deal with since he has very little interest in cooperating with the medical staff.  His knee jerk reflex answered to all of our questions is: "I do not know.".  He states that he recently went to the emergency room due to cardiac problems and according to him "they did not find anything".  Today he is reporting a right shoulder pain with decreased range of motion which he states started 2 weeks ago.  However, during our initial evaluation he had stated that his third worst pain was chronic bilateral shoulder pain.  Apparently he is currently having a flareup of the pain in the right shoulder.  At this point, despite the fact that he was informed that in order to be a patient here he has to have a PCP, he has not secured one.  He comes in accompanied by a nurse caregiver.  During the visit she put me in contact with the patient's daughter and between the nurse and the daughter did provide me with some useful information.  Apparently he has  an upcoming appointment on August 4 to see a cardiologist, on follow-up from the recent  hospital visit.  He also appears to have an initial visit appointment on August 8 with his possible future PCP, Gladstone Lighter, MD [phone])  Regarding his pain medication, he indicates that it is not helping.  He also indicates that it is causing some constipation.  His last UDS came back negative for the medication.  Today I will be repeating that UDS since I suspect that he is not taking it appropriately.  Today I spoke to him and I recommended that he stop the medication altogether for a couple of days to see if he could tell a difference between taking the medication and not taking it.  I suggested that if during those days his pain worsens, it is likely due to the fact that the medication was in fact helping with some of his pain.  If on the other hand, he reports back to me that he cannot really tell a difference, then I will discontinue the use of the opioid due to failure to provide him with benefit.  For the constipation, prescription to the pharmacy for [place].  I will start with a low-dose and if he has no side effects, but it still not effective, we will increase it.  I also noticed that he has elevated uric acid levels but he does not have a diagnosis of gout and he is also not been treated with any allopurinol or colchicine.  The medical record indicates that he has diabetes, but there is no hemoglobin A1c and the patient refers that he does not have diabetes and he is also not taking any medications for it.  Today I will be ordering a hemoglobin A1c level as well as a point-of-care dextrose. (POC Dextrostick - 96 mg/dl).  In addition to the above, today I will also order an x-ray of his right shoulder.  Today he is experiencing quite a bit of pain and has decreased range of motion.  He was unable to raise the arm above shoulder level.  Physical exam today was compatible with a possible rotator cuff problem.  RTCB: 08/05/2021 Nonopioids transfer 11/29/2020: Magnesium, calcium, vitamin  D3, and gabapentin.  Pharmacotherapy Assessment  Analgesic: Oxycodone IR 5 mg, 1 tab p.o. daily as needed (23 pills/month, average use) MME/day: 7.5 mg/day   Monitoring:  PMP: PDMP reviewed during this encounter.       Pharmacotherapy: No side-effects or adverse reactions reported. Compliance: No problems identified. Effectiveness: Clinically acceptable.  No notes on file  UDS:  Summary  Date Value Ref Range Status  06/06/2021 Note  Final    Comment:    ==================================================================== ToxASSURE Select 13 (MW) ==================================================================== Test                             Result       Flag       Units  Drug Absent but Declared for Prescription Verification   Oxycodone                      Not Detected UNEXPECTED ng/mg creat ==================================================================== Test                      Result    Flag   Units      Ref Range   Creatinine  57               mg/dL      >=20 ==================================================================== Declared Medications:  The flagging and interpretation on this report are based on the  following declared medications.  Unexpected results may arise from  inaccuracies in the declared medications.   **Note: The testing scope of this panel includes these medications:   Oxycodone (Roxicodone)   **Note: The testing scope of this panel does not include the  following reported medications:   Albuterol (Ventolin HFA)  Amlodipine (Norvasc)  Aspirin  Atorvastatin (Lipitor)  Azelastine (Astelin)  Calcium  Carvedilol (Coreg)  Clopidogrel (Plavix)  Fluticasone (Flonase)  Fluticasone (Trelegy)  Furosemide (Lasix)  Gabapentin (Neurontin)  Ipratropium (Atrovent)  Isosorbide (Imdur)  Magnesium  Multivitamin  Nitroglycerin (Nitrostat)  Quetiapine (Seroquel)  Supplement  Umeclidinium (Trelegy)  Vilanterol (Trelegy)   Vitamin D3 ==================================================================== For clinical consultation, please call [phone]. ====================================================================      ROS  Constitutional: Denies any fever or chills Gastrointestinal: No reported hemesis, hematochezia, vomiting, or acute GI distress Musculoskeletal: Denies any acute onset joint swelling, redness, loss of ROM, or weakness Neurological: No reported episodes of acute onset apraxia, aphasia, dysarthria, agnosia, amnesia, paralysis, loss of coordination, or loss of consciousness  Medication Review  Fluticasone-Umeclidin-Vilant, Magnesium Oxide, QUEtiapine, Vitamin D3, albuterol, amLODipine, aspirin, atorvastatin, azelastine, calcium carbonate, carvedilol, clopidogrel, feeding supplement, fluticasone, furosemide, gabapentin, ipratropium, isosorbide mononitrate, lubiprostone, multivitamin with minerals, nitroGLYCERIN, and oxyCODONE  History Review  Allergy: Mr. Sarafian is allergic to chlorthalidone, ace inhibitors, hydrochlorothiazide, lisinopril, losartan, other, and benazepril-hydrochlorothiazide. Drug: Mr. Morren  reports no history of drug use. Alcohol:  reports previous alcohol use. Tobacco:  reports that he has quit smoking. His smoking use included cigarettes. He has a 50.00 pack-year smoking history. He has never used smokeless tobacco. Social: Mr. Jurney  reports that he has quit smoking. His smoking use included cigarettes. He has a 50.00 pack-year smoking history. He has never used smokeless tobacco. He reports previous alcohol use. He reports that he does not use drugs. Medical:  has a past medical history of Alcoholism ([place]), Asthma, Bipolar affective disorder ([place]), Chronic combined systolic and diastolic CHF (congestive heart failure) ([place]), CKD (chronic kidney disease), stage III ([place]), COPD (chronic obstructive pulmonary disease) ([place]), Coronary artery disease, Degenerative joint  disease, Dyspnea, Essential hypertension, Hyperlipidemia, Hypertension, Ischemic cardiomyopathy, OSA (obstructive sleep apnea), Prostate CA ([place]), PVD (peripheral vascular disease) ([place]), and Tobacco abuse. Surgical: Mr. Welty  has a past surgical history that includes Total hip arthroplasty; Coronary/Graft Acute MI Revascularization (N/A, 04/01/2019); CORONARY ANGIOGRAPHY (N/A, 04/01/2019); Cardiac catheterization; and Coronary angioplasty with stent. Family: family history includes Heart attack in his father and mother; Hyperlipidemia in his father and mother; Hypertension in his father and mother.  Laboratory Chemistry Profile   Renal Lab Results  Component Value Date   BUN 26 (H) 06/20/2021   CREATININE 2.19 (H) 06/20/2021   LABCREA 124 05/29/2017   BCR 14 09/20/2020   GFRAA 30 (L) 09/20/2020   GFRNONAA 29 (L) 06/20/2021    Hepatic Lab Results  Component Value Date   AST 18 06/20/2021   ALT 9 06/20/2021   ALBUMIN 3.5 06/20/2021   ALKPHOS 123 06/20/2021   HCVAB NEGATIVE 10/31/2010   AMYLASE 56 11/01/2010   LIPASE 48 07/31/2017   AMMONIA 11 02/11/2021    Electrolytes Lab Results  Component Value Date   NA 129 (L) 06/20/2021   K 4.1 06/20/2021   CL 96 (L) 06/20/2021   CALCIUM 8.7 (  L) 06/20/2021   MG 2.0 02/16/2021   PHOS 4.6 01/10/2021    Bone Lab Results  Component Value Date   VD25OH 52.12 01/09/2021   25OHVITD1 16 (L) 09/20/2020   25OHVITD2 <1.0 09/20/2020   25OHVITD3 15 09/20/2020    Inflammation (CRP: Acute Phase) (ESR: Chronic Phase) Lab Results  Component Value Date   CRP 5.7 (H) 02/16/2021   ESRSEDRATE 87 (H) 01/09/2021   LATICACIDVEN 1.0 10/30/2010       Note: Above Lab results reviewed.  Recent Imaging Review  DG Chest 2 View CLINICAL DATA:  Pt c/o non-radiating centralized CP since last night, per pt he is no longer having CP. Pt has been out of some his medications but doesn't know which he is out of. Pt does have a hx of L hip fx that inoperable.  Hx of asthma, HTN, and COPD.  EXAM: CHEST - 2 VIEW  COMPARISON:  02/11/2021  FINDINGS: Cardiac silhouette is normal in size. No mediastinal or hilar masses or evidence of adenopathy.  Right middle lobe opacity partly obscures the right hemidiaphragm, consistent with atelectasis and/or scarring, similar to the prior study. Remainder of the lungs is clear.  No pleural effusion or pneumothorax.  Skeletal structures are intact.  IMPRESSION: No active cardiopulmonary disease.  Electronically Signed   By: Lajean Manes M.D.   On: 06/20/2021 08:38 Note: Reviewed        Physical Exam  General appearance: Well nourished, well developed, and well hydrated. In no apparent acute distress Mental status: Alert, oriented x 3 (person, place, & time)       Respiratory: No evidence of acute respiratory distress Eyes: PERLA Vitals: BP (!) 123/58 (BP Location: Right Arm, Patient Position: Sitting, Cuff Size: Normal)   Pulse 65   Temp (!) 97.1 F (36.2 C) (Temporal)   Resp 16   Ht '5\' 7"'  (1.702 m)   Wt 250 lb (113.4 kg)   SpO2 97%   BMI 39.16 kg/m  BMI: Estimated body mass index is 39.16 kg/m as calculated from the following:   Height as of this encounter: '5\' 7"'  (1.702 m).   Weight as of this encounter: 250 lb (113.4 kg). Ideal: Ideal body weight: 66.1 kg (145 lb 11.6 oz) Adjusted ideal body weight: 85 kg (187 lb 6.9 oz)  Assessment   Status Diagnosis  Controlled Controlled Controlled 1. Chronic pain syndrome   2. Chronic hip pain (1ry area of Pain) (Left)   3. Chronic knee pain (2ry area of Pain) (Bilateral)   4. Chronic shoulder pain (3ry area of Pain) (Bilateral)   5. Arthropathy of knee (Left)   6. Chronic knee pain (Left)   7. Pharmacologic therapy   8. Chronic use of opiate for therapeutic purpose   9. Encounter for medication management   10. Type 2 diabetes mellitus with chronic kidney disease, without long-term current use of insulin, unspecified CKD stage ([place])    11. Type 2 diabetes mellitus with diabetic peripheral angiopathy without gangrene, without long-term current use of insulin ([place])   12. Acute pain of right shoulder   13. Therapeutic opioid-induced constipation (OIC)      Updated Problems: Problem  Acute Pain of Right Shoulder  Therapeutic Opioid-Induced Constipation ([facility])  Hypothyroidism  Hyposmolality and/Or Hyponatremia  Type 2 Diabetes Mellitus With Diabetic Chronic Kidney Disease ([facility])  Type 2 Diabetes Mellitus With Diabetic Peripheral Angiopathy Without Gangrene ([facility])    Plan of Care  Problem-specific:  No problem-specific Assessment & Plan notes found for  this encounter.  Mr. JAIMIN KRUPKA has a current medication list which includes the following long-term medication(s): albuterol, amlodipine, atorvastatin, carvedilol, furosemide, gabapentin, ipratropium, isosorbide mononitrate, nitroglycerin, quetiapine, calcium carbonate, magnesium oxide, [START ON 07/06/2021] oxycodone, and [DISCONTINUED] vitamin d3.  Pharmacotherapy (Medications Ordered): Meds ordered this encounter  Medications   oxyCODONE (OXY IR/ROXICODONE) 5 MG immediate release tablet    Sig: Take 1 tablet (5 mg total) by mouth every 8 (eight) hours. Must last 30 days.    Dispense:  90 tablet    Refill:  0    Not a duplicate. Do NOT delete! Dispense 1 day early if closed on refill date. Avoid benzodiazepines within 8 hours of opioids. Do not send refill requests.   lubiprostone (AMITIZA) 8 MCG capsule    Sig: Take 1 capsule (8 mcg total) by mouth 2 (two) times daily with a meal. Swallow whole, do not break or chew.    Dispense:  60 capsule    Refill:  2    Fill one day early if pharmacy is closed on scheduled refill date. Generic permitted. Do not send renewal requests.    Orders:  Orders Placed This Encounter  Procedures   DG Shoulder Right    Standing Status:   Future    Standing Expiration Date:   07/30/2021    Scheduling Instructions:     Imaging must  be done as soon as possible. Inform patient that order will expire within 30 days and I will not renew it.    Order Specific Question:   Reason for Exam (SYMPTOM  OR DIAGNOSIS REQUIRED)    Answer:   Right shoulder pain    Order Specific Question:   Preferred imaging location?    Answer:   [facility]    Order Specific Question:   Call Results- Best Contact Number?    Answer:   [phone] ([facility])    Order Specific Question:   Release to patient    Answer:   Immediate   ToxASSURE Select 13 (MW), Urine    Volume: 30 ml(s). Minimum 3 ml of urine is needed. Document temperature of fresh sample. Indications: Long term (current) use of opiate analgesic [id number])    Order Specific Question:   Release to patient    Answer:   Immediate   Hemoglobin A1c    Order Specific Question:   CC Results    Answer:   PCP-NURSE [[id number]]    Order Specific Question:   Release to patient    Answer:   Immediate   POCT glucose (manual entry)    Follow-up plan:   Return in about 5 weeks (around 08/05/2021) for evaluation day (F2F) (MM) to review labs, shoulder XR, and OIC.     Interventional Therapies  Risk  Complexity Considerations:   Class III morbid obesity (BMI 42.29 kg/m) Difficulty breathing and tolerating prone position NOTE: PLAVIX Anticoagulation (Stop: 7-10 days  Restart: 2 hours) Chronic CHF  CAD with history of MI  History of alcohol abuse  Chronic kidney disease stage III  COPD  OSA  Carotid artery disease  Abnormal heart rhythm with PVCs during procedure    Planned  Pending:   Possible series of 5 left knee Hyalgan injections    Under consideration:   Pending to order bilateral shoulder x-rays.  Possible need for neurosurgical evaluation.  We will also consider nerve conduction testing of the lower extremities. Diagnostic left L2-3 LESI #1  Diagnostic left L3-4 LESI #1  Diagnostic left L4-5  LESI #1  Diagnostic bilateral L2 TFESI #1  Diagnostic bilateral L3  TFESI #1  Diagnostic bilateral L4 TFESI #1  Diagnostic bilateral lumbar facet block #1  Diagnostic bilateral femoral nerve and obturator nerve block #1  Diagnostic bilateral IA knee joint injection (steroid) #1  Possible series of bilateral IA Hyalgan knee injections  Diagnostic bilateral genicular nerve block #1    Completed:   Diagnostic left L2-3 LESI x1 (11/16/2020) (100/100/100 x2 days/<50) Diagnostic/therapeutic left IA hip injection x2 (11/30/2020) (12/23/2020) Diagnostic/therapeutic left subgluteus maximus bursa injection x1 (11/30/2020)  Therapeutic left IA Hyalgan knee injection x1 (12/23/2020)   Palliative options:   Therapeutic left L2-3 LESI #2     Recent Visits Date Type Provider Dept  06/06/21 Office Visit Milinda Pointer, MD [facility]  Showing recent visits within past 90 days and meeting all other requirements Today's Visits Date Type Provider Dept  06/29/21 Office Visit Milinda Pointer, MD [facility]  Showing today's visits and meeting all other requirements Future Appointments No visits were found meeting these conditions. Showing future appointments within next 90 days and meeting all other requirements I discussed the assessment and treatment plan with the patient. The patient was provided an opportunity to ask questions and all were answered. The patient agreed with the plan and demonstrated an understanding of the instructions.  Patient advised to call back or seek an in-person evaluation if the symptoms or condition worsens.  Duration of encounter: 50 minutes.  Note by: Gaspar Cola, MD Date: 06/29/2021; Time: 11:09 AM

## 2021-06-29 ENCOUNTER — Ambulatory Visit
Admission: RE | Admit: 2021-06-29 | Discharge: 2021-06-29 | Disposition: A | Discharge status: Home / Self Care | Payer: HMO | Source: Ambulatory Visit | Attending: Pain Medicine | Admitting: Pain Medicine

## 2021-06-29 ENCOUNTER — Other Ambulatory Visit
Admission: RE | Admit: 2021-06-29 | Discharge: 2021-06-29 | Disposition: A | Discharge status: Home / Self Care | Payer: HMO | Source: Home / Self Care | Attending: Pain Medicine | Admitting: Pain Medicine

## 2021-06-29 ENCOUNTER — Encounter: Payer: Self-pay | Admitting: Pain Medicine

## 2021-06-29 ENCOUNTER — Ambulatory Visit (HOSPITAL_BASED_OUTPATIENT_CLINIC_OR_DEPARTMENT_OTHER): Payer: HMO | Admitting: Pain Medicine

## 2021-06-29 ENCOUNTER — Other Ambulatory Visit: Payer: Self-pay

## 2021-06-29 VITALS — BP 123/58 | HR 65 | Temp 97.1°F | Resp 16 | Ht 67.0 in | Wt 250.0 lb

## 2021-06-29 DIAGNOSIS — M25552 Pain in left hip: Secondary | ICD-10-CM

## 2021-06-29 DIAGNOSIS — M1712 Unilateral primary osteoarthritis, left knee: Secondary | ICD-10-CM | POA: Insufficient documentation

## 2021-06-29 DIAGNOSIS — M25511 Pain in right shoulder: Secondary | ICD-10-CM | POA: Insufficient documentation

## 2021-06-29 DIAGNOSIS — T402X5A Adverse effect of other opioids, initial encounter: Secondary | ICD-10-CM | POA: Insufficient documentation

## 2021-06-29 DIAGNOSIS — M25562 Pain in left knee: Secondary | ICD-10-CM | POA: Insufficient documentation

## 2021-06-29 DIAGNOSIS — M25561 Pain in right knee: Secondary | ICD-10-CM

## 2021-06-29 DIAGNOSIS — G8929 Other chronic pain: Secondary | ICD-10-CM | POA: Insufficient documentation

## 2021-06-29 DIAGNOSIS — Z79899 Other long term (current) drug therapy: Secondary | ICD-10-CM

## 2021-06-29 DIAGNOSIS — M25512 Pain in left shoulder: Secondary | ICD-10-CM

## 2021-06-29 DIAGNOSIS — G894 Chronic pain syndrome: Secondary | ICD-10-CM

## 2021-06-29 DIAGNOSIS — Z79891 Long term (current) use of opiate analgesic: Secondary | ICD-10-CM | POA: Insufficient documentation

## 2021-06-29 DIAGNOSIS — E1122 Type 2 diabetes mellitus with diabetic chronic kidney disease: Secondary | ICD-10-CM | POA: Insufficient documentation

## 2021-06-29 DIAGNOSIS — K5903 Drug induced constipation: Secondary | ICD-10-CM | POA: Insufficient documentation

## 2021-06-29 DIAGNOSIS — E1151 Type 2 diabetes mellitus with diabetic peripheral angiopathy without gangrene: Secondary | ICD-10-CM | POA: Insufficient documentation

## 2021-06-29 LAB — HEMOGLOBIN A1C
Hgb A1c MFr Bld: 6.1 % — ABNORMAL HIGH (ref 4.8–5.6)
Mean Plasma Glucose: 128.37 mg/dL

## 2021-06-29 LAB — GLUCOSE, CAPILLARY: Glucose-Capillary: 96 mg/dL (ref 70–99)

## 2021-06-29 MED ORDER — LUBIPROSTONE 8 MCG PO CAPS: 8.0000 ug | ORAL_CAPSULE | Freq: Two times a day (BID) | ORAL | 2 refills | Status: DC

## 2021-06-29 MED ORDER — OXYCODONE HCL 5 MG PO TABS: 5.0000 mg | ORAL_TABLET | Freq: Three times a day (TID) | ORAL | 0 refills | Status: DC

## 2021-06-29 NOTE — Patient Instructions (Addendum)
_Patient informed that he had xrays and labs to be done. Assistant that was with him was informed.  ___________________________________________________________________________________________  Medication Evaluation  Purpose: The purpose of these questions is to establish the onset of effects (speed of absorption), peak benefit (effectiveness), side-effects (ability to tolerate), and duration (excretion/metabolism) of the prescribed medication. The results will help the healthcare provider decide what to do with the medication in question.  Please indicate:  Time to onset of benefits: The amount of time it takes for you to begin perceiving any benefits after taking (swallowing) your pain medicine. _______ minutes.  Time to peak effect: The time it takes between taking medicine (swallowing it) and the moment when you feel the most benefit (peak effect) from the medicine. _______ minutes.  Peak benefit: Please quantify the amount of relief you obtain from the medicine at the moment it is working the best. Does you pain completely go away (100% gone)? Is it three quarters (3/4) better (75% relief)? Does half of your pain go away (50% benefit)? Is it a third better (33% improved)? Or does it go down only by a fourth (25% better)? _______ % relief.  Duration of benefit: The time it takes for all benefits of the medicine to be gone. This period of time starts when you first swallow your pain pill and ends when you feel that your pain has increased to the point where you absolutely need to take another pill. _______ hours and _______ minutes.  Adverse reactions: These are side effects and/or adverse reactions you experience since taking the medicine or only when taking your pain medicine. Please circle any and all that apply:  Allergic reactions: itching; hives; generalized redness; swelling of the tongue; swelling of the eyes; difficulty breathing. Neurological Intolerance: cognitive impairment  (difficulty thinking clearly; memory problems; difficulty remembering things; slurred speech); oversedation; sleepiness: unsteadiness; difficulty walking. Gastrointestinal problems: constipation; nausea; vomiting; dry heaves; decreased appetite. Hormonal problems: decreased sex drive; erection problems; abnormal menstrual period; weight gain or inability to lose weight; weakness or low energy; hair loss.  What to do: When completed, please bring back to your next visit. Please give it to the admitting nurse.  (Last updated: 05/01/2019  _________________________________________________________________________________________

## 2021-06-29 NOTE — Progress Notes (Signed)
Nursing Pain Medication Assessment:  Safety precautions to be maintained throughout the outpatient stay will include: orient to surroundings, keep bed in low position, maintain call bell within reach at all times, provide assistance with transfer out of bed and ambulation.  Medication Inspection Compliance: Pill count conducted under aseptic conditions, in front of the patient. Neither the pills nor the bottle was removed from the patient's sight at any time. Once count was completed pills were immediately returned to the patient in their original bottle.  Medication: See above Pill/Patch Count:  16 of 90 pills remain Pill/Patch Appearance: Markings consistent with prescribed medication Bottle Appearance: Standard pharmacy container. Clearly labeled. Filled Date: 06 / 20 / 2022 Last Medication intake:   States he does not know  Patient being very agitated.  Not wanting to answer any questions and raising his voice.  Dr Dossie Arbour notified.

## 2021-07-04 ENCOUNTER — Other Ambulatory Visit: Payer: Self-pay | Admitting: Pain Medicine

## 2021-07-04 DIAGNOSIS — S43014A Anterior dislocation of right humerus, initial encounter: Secondary | ICD-10-CM

## 2021-07-04 NOTE — Progress Notes (Signed)
X-rays of the right shoulder show a possible interval anterior shoulder dislocation and degenerative changes.  Today I am entering a referral to orthopedic surgery.

## 2021-07-06 ENCOUNTER — Telehealth: Payer: Self-pay

## 2021-07-06 NOTE — Telephone Encounter (Signed)
Called patient to inform him of the xray results.  He states that someone called him this morning to inform him of this.

## 2021-07-07 LAB — TOXASSURE SELECT 13 (MW), URINE

## 2021-07-15 ENCOUNTER — Ambulatory Visit: Payer: HMO | Admitting: Orthopaedic Surgery

## 2021-07-21 ENCOUNTER — Ambulatory Visit: Payer: HMO | Admitting: Cardiovascular Disease

## 2021-07-25 DIAGNOSIS — M76899 Other specified enthesopathies of unspecified lower limb, excluding foot: Secondary | ICD-10-CM | POA: Insufficient documentation

## 2021-07-27 ENCOUNTER — Other Ambulatory Visit: Payer: Self-pay | Admitting: Pain Medicine

## 2021-07-27 DIAGNOSIS — G894 Chronic pain syndrome: Secondary | ICD-10-CM

## 2021-07-27 DIAGNOSIS — Z79891 Long term (current) use of opiate analgesic: Secondary | ICD-10-CM

## 2021-07-27 DIAGNOSIS — G8929 Other chronic pain: Secondary | ICD-10-CM

## 2021-07-27 DIAGNOSIS — Z79899 Other long term (current) drug therapy: Secondary | ICD-10-CM

## 2021-07-27 DIAGNOSIS — M25562 Pain in left knee: Secondary | ICD-10-CM

## 2021-07-31 NOTE — Progress Notes (Signed)
PROVIDER NOTE: Information contained herein reflects review and annotations entered in association with encounter. Interpretation of such information and data should be left to medically-trained personnel. Information provided to patient can be located elsewhere in the medical record under "Patient Instructions". Document created using STT-dictation technology, any transcriptional errors that may result from process are unintentional.    Patient: [name]  Service Category: E/M  Provider: Gaspar Cola, MD  DOB: 31-Oct-1935  DOS: 08/02/2021  Specialty: Interventional Pain Management  MRN: [id number]  Setting: Ambulatory outpatient  PCP: Pcp, No  Type: Established Patient    Referring Provider: No ref. provider found  Location: Office  Delivery: Face-to-face     HPI  [name], a 86 y.o. year old male, is here today because of his Closed anterior dislocation of right shoulder, subsequent encounter [S43.014D]. Mr. Gunner primary complain today is Shoulder Pain (right) Last encounter: My last encounter with him was on 07/27/2021. Pertinent problems: Mr. Rout has [facility]); Lower extremity pain (Bilateral); Chronic pain syndrome; Chronic hip pain (1ry area of Pain) (Left); Unilateral post-traumatic osteoarthritis of hip (Left); Chronic knee pain (2ry area of Pain) (Bilateral); Secondary osteoarthritis of knee (Bilateral); Osteoarthritis of knees (Bilateral); Chronic shoulder pain (3ry area of Pain) (Bilateral); Grade 1 (3 mm) anterolisthesis of lumbar spine (L4-5) (stable); Chronic groin pain (Left); Other intervertebral disc degeneration, lumbar region; Neurogenic pain; Klippel-Feil deformity at the C5-C7 levels; Lumbosacral spondylosis; Osteoarthritis of facet joint of lumbar spine; DDD (degenerative disc disease), lumbosacral; History of total hip replacement (Right); Osteoarthritis of hip (Left); Abnormal MRI, lumbar spine (10/07/2020); Lumbar central spinal stenosis with neurogenic  claudication; Lumbar foraminal stenosis; Polyarthritis with positive rheumatoid factor ([place]); Hip joint effusion (Left); Osteoarthritis of knee (Left); Arthropathy of knee (Left); Chronic knee pain (Left); Hip fracture ([place]); Calcaneal fracture (Right); PVD (peripheral vascular disease) ([place]); Acute pain of right shoulder; Anterior dislocation of right shoulder; and Closed anterior dislocation of shoulder (Right) on their pertinent problem list. Pain Assessment: Severity of Chronic pain is reported as a 10-Worst pain ever/10. Location: Shoulder Right/Denies. Onset: More than a month ago. Quality: Constant, Pressure, Aching, Throbbing. Timing: Constant. Modifying factor(s): nothing. Vitals:  height is '5\' 7"'  (1.702 m) and weight is 240 lb (108.9 kg). His temperature is 97.1 F (36.2 C) (abnormal). His blood pressure is 122/64 and his pulse is 69. His oxygen saturation is 98%.   Reason for encounter: medication management.  In addition, today's visit is to follow-up with the ordered tests and referrals.  X-rays of the right shoulder show a possible interval anterior shoulder dislocation and degenerative changes.  On 07/04/2021 I entered a referral to orthopedic surgery for evaluation and management of this dislocation.  The patient indicates doing well with the current medication regimen. No adverse reactions or side effects reported to the medications.  He refers that he is now taking the medications as he should and he is doing well with it.  He is pending to see the orthopedic surgeon.  RTCB: 11/03/2021  Pharmacotherapy Assessment  Analgesic: Oxycodone IR 5 mg, 1 tab p.o. daily as needed (23 pills/month, average use) MME/day: 7.5 mg/day   Monitoring: [place] PMP: PDMP reviewed during this encounter.       Pharmacotherapy: No side-effects or adverse reactions reported. Compliance: No problems identified. Effectiveness: Clinically acceptable.  Chauncey Fischer, RN  08/02/2021  2:30 PM  Sign when Signing  Visit Nursing Pain Medication Assessment:  Safety precautions to be maintained throughout the outpatient stay will  include: orient to surroundings, keep bed in low position, maintain call bell within reach at all times, provide assistance with transfer out of bed and ambulation.  Medication Inspection Compliance: Pill count conducted under aseptic conditions, in front of the patient. Neither the pills nor the bottle was removed from the patient's sight at any time. Once count was completed pills were immediately returned to the patient in their original bottle.  Medication: Oxycodone IR Pill/Patch Count:  35 of 90 pills remain Pill/Patch Appearance: Markings consistent with prescribed medication Bottle Appearance: Standard pharmacy container. Clearly labeled. Filled Date: 7 / 20 / 2022 Last Medication intake:  Today Safety precautions to be maintained throughout the outpatient stay will include: orient to surroundings, keep bed in low position, maintain call bell within reach at all times, provide assistance with transfer out of bed and ambulation.      UDS:  Summary  Date Value Ref Range Status  06/29/2021 Note  Final    Comment:    ==================================================================== [employer] (MW) ==================================================================== Test                             Result       Flag       Units  Drug Present and Declared for Prescription Verification   Oxymorphone                    85           EXPECTED   ng/mg creat    Sources of oxymorphone include scheduled prescription medications;    it is also an expected metabolite of oxycodone.  Drug Absent but Declared for Prescription Verification   Oxycodone                      Not Detected UNEXPECTED ng/mg creat    Oxycodone is almost always present in patients taking this drug    consistently.  Absence of oxycodone could be due to lapse of time    since the last dose or  unusual pharmacokinetics (rapid metabolism).  ==================================================================== Test                      Result    Flag   Units      Ref Range   Creatinine              81               mg/dL      >=20 ==================================================================== Declared Medications:  The flagging and interpretation on this report are based on the  following declared medications.  Unexpected results may arise from  inaccuracies in the declared medications.   **Note: The testing scope of this panel includes these medications:   Oxycodone   **Note: The testing scope of this panel does not include the  following reported medications:   Albuterol  Amlodipine  Aspirin  Atorvastatin  Azelastine  Calcium  Carvedilol  Cholecalciferol  Clopidogrel (Plavix)  Fluticasone (Flonase)  Fluticasone (Trelegy)  Furosemide  Ipratropium (Atrovent)  Isosorbide (Imdur)  Magnesium  Multivitamin  Nitroglycerin  Quetiapine  Umeclidinium (Trelegy)  Vilanterol (Trelegy) ==================================================================== For clinical consultation, please call [phone]. ====================================================================      ROS  Constitutional: Denies any fever or chills Gastrointestinal: No reported hemesis, hematochezia, vomiting, or acute GI distress Musculoskeletal: Denies any acute onset joint swelling, redness, loss of ROM, or weakness Neurological:  No reported episodes of acute onset apraxia, aphasia, dysarthria, agnosia, amnesia, paralysis, loss of coordination, or loss of consciousness  Medication Review  Fluticasone-Umeclidin-Vilant, Magnesium Oxide, QUEtiapine, Vitamin D3, albuterol, amLODipine, aspirin, atorvastatin, azelastine, calcium carbonate, carvedilol, clopidogrel, feeding supplement, fluticasone, furosemide, ipratropium, isosorbide mononitrate, nitroGLYCERIN, oxyCODONE, and  tamsulosin  History Review  Allergy: Mr. Belshe is allergic to chlorthalidone, ace inhibitors, hydrochlorothiazide, lisinopril, losartan, other, and benazepril-hydrochlorothiazide. Drug: Mr. Manthey  reports no history of drug use. Alcohol:  reports that he does not currently use alcohol. Tobacco:  reports that he has quit smoking. His smoking use included cigarettes. He has a 50.00 pack-year smoking history. He has never used smokeless tobacco. Social: Mr. Masini  reports that he has quit smoking. His smoking use included cigarettes. He has a 50.00 pack-year smoking history. He has never used smokeless tobacco. He reports that he does not currently use alcohol. He reports that he does not use drugs. Medical:  has a past medical history of Alcoholism ([place]), Asthma, Bipolar affective disorder ([place]), Chronic combined systolic and diastolic CHF (congestive heart failure) ([place]), CKD (chronic kidney disease), stage III ([place]), COPD (chronic obstructive pulmonary disease) ([place]), Coronary artery disease, Degenerative joint disease, Dyspnea, Essential hypertension, Hyperlipidemia, Hypertension, Ischemic cardiomyopathy, OSA (obstructive sleep apnea), Prostate CA ([place]), PVD (peripheral vascular disease) ([place]), and Tobacco abuse. Surgical: Mr. Kniskern  has a past surgical history that includes Total hip arthroplasty; Coronary/Graft Acute MI Revascularization (N/A, 04/01/2019); CORONARY ANGIOGRAPHY (N/A, 04/01/2019); Cardiac catheterization; and Coronary angioplasty with stent. Family: family history includes Heart attack in his father and mother; Hyperlipidemia in his father and mother; Hypertension in his father and mother.  Laboratory Chemistry Profile   Renal Lab Results  Component Value Date   BUN 26 (H) 06/20/2021   CREATININE 2.19 (H) 06/20/2021   LABCREA 124 05/29/2017   BCR 14 09/20/2020   GFRAA 30 (L) 09/20/2020   GFRNONAA 29 (L) 06/20/2021    Hepatic Lab Results  Component Value Date   AST 18 06/20/2021    ALT 9 06/20/2021   ALBUMIN 3.5 06/20/2021   ALKPHOS 123 06/20/2021   HCVAB NEGATIVE 10/31/2010   AMYLASE 56 11/01/2010   LIPASE 48 07/31/2017   AMMONIA 11 02/11/2021    Electrolytes Lab Results  Component Value Date   NA 129 (L) 06/20/2021   K 4.1 06/20/2021   CL 96 (L) 06/20/2021   CALCIUM 8.7 (L) 06/20/2021   MG 2.0 02/16/2021   PHOS 4.6 01/10/2021    Bone Lab Results  Component Value Date   VD25OH 52.12 01/09/2021   25OHVITD1 16 (L) 09/20/2020   25OHVITD2 <1.0 09/20/2020   25OHVITD3 15 09/20/2020    Inflammation (CRP: Acute Phase) (ESR: Chronic Phase) Lab Results  Component Value Date   CRP 5.7 (H) 02/16/2021   ESRSEDRATE 87 (H) 01/09/2021   LATICACIDVEN 1.0 10/30/2010         Note: Above Lab results reviewed.  Recent Imaging Review  DG Shoulder Right CLINICAL DATA:  Chronic right shoulder pain, worse this morning. No shoulder injury.  EXAM: RIGHT SHOULDER - 2+ VIEW  COMPARISON:  Portable chest dated 06/20/2021.  FINDINGS: Somewhat oblique views of the right shoulder suggest interval anterior and medial dislocation of the humeral head relative to the glenoid. This is not seen on the previous portable chest radiograph with better positioning of the shoulder in the frontal projection on that radiograph. Glenohumeral and acromioclavicular spur formation. No visible fractures.  IMPRESSION: Possible interval anterior shoulder dislocation. Degenerative changes.  These results will  be called to the ordering clinician or representative by the Radiologist Assistant, and communication documented in the PACS or [employer].  Electronically Signed   By: Claudie Revering M.D.   On: 07/01/2021 21:11 Note: Reviewed        Physical Exam  General appearance: Well nourished, well developed, and well hydrated. In no apparent acute distress Mental status: Alert, oriented x 3 (person, place, & time)       Respiratory: No evidence of acute respiratory  distress Eyes: PERLA Vitals: BP 122/64   Pulse 69   Temp (!) 97.1 F (36.2 C)   Ht '5\' 7"'  (1.702 m)   Wt 240 lb (108.9 kg)   SpO2 98%   BMI 37.59 kg/m  BMI: Estimated body mass index is 37.59 kg/m as calculated from the following:   Height as of this encounter: '5\' 7"'  (1.702 m).   Weight as of this encounter: 240 lb (108.9 kg). Ideal: Ideal body weight: 66.1 kg (145 lb 11.6 oz) Adjusted ideal body weight: 83.2 kg (183 lb 6.9 oz)  Assessment   Status Diagnosis  Controlled Controlled Controlled 1. Closed anterior dislocation of right shoulder, subsequent encounter   2. Acute pain of right shoulder   3. Anterior dislocation of right shoulder, subsequent encounter   4. Chronic pain syndrome   5. Chronic hip pain (1ry area of Pain) (Left)   6. Pharmacologic therapy   7. Chronic use of opiate for therapeutic purpose   8. Chronic knee pain (2ry area of Pain) (Bilateral)   9. Chronic shoulder pain (3ry area of Pain) (Bilateral)   10. Encounter for medication management      Updated Problems: Problem  Closed anterior dislocation of shoulder (Right)  Cad (Coronary Artery Disease), Native Coronary Artery   PCI and Taxus drug-eluting stent placement to the distal RCA (3.5 x 12 mm) Formatting of this note might be different from the original. Formatting of this note might be different from the original. PCI and Taxus drug-eluting stent placement to the distal RCA (3.5 x 12 mm)  Last Assessment & Plan:  Formatting of this note might be different from the original. No further chest pain since controlling his blood pressure. Continue medical therapy.   Hyperlipidemia, Unspecified   Formatting of this note might be different from the original. Last Assessment & Plan:  Formatting of this note might be different from the original. Given his history of coronary artery disease, he should be on a statin. I requested fasting lipid and liver profile.   Essential Hypertension   Formatting  of this note might be different from the original. Last Assessment & Plan:  Formatting of this note might be different from the original. Blood pressure is now well controlled on losartan.   Enthesopathy of Hip Region  Cyst of Kidney, Acquired   Formatting of this note might be different from the original. Formatting of this note might be different from the original. Bilateral   Erectile Dysfunction    Plan of Care  Problem-specific:  No problem-specific Assessment & Plan notes found for this encounter.  Mr. CAMERYN CHRISLEY has a current medication list which includes the following long-term medication(s): albuterol, amlodipine, atorvastatin, carvedilol, furosemide, ipratropium, isosorbide mononitrate, nitroglycerin, [START ON 08/05/2021] oxycodone, [START ON 09/04/2021] oxycodone, [START ON 10/04/2021] oxycodone, quetiapine, calcium carbonate, magnesium oxide, and [DISCONTINUED] vitamin d3.  Pharmacotherapy (Medications Ordered): Meds ordered this encounter  Medications   oxyCODONE (OXY IR/ROXICODONE) 5 MG immediate release tablet    Sig: Take  1 tablet (5 mg total) by mouth every 8 (eight) hours. Must last 30 days.    Dispense:  90 tablet    Refill:  0    Not a duplicate. Do NOT delete! Dispense 1 day early if closed on refill date. Avoid benzodiazepines within 8 hours of opioids. Do not send refill requests.   oxyCODONE (OXY IR/ROXICODONE) 5 MG immediate release tablet    Sig: Take 1 tablet (5 mg total) by mouth every 8 (eight) hours. Must last 30 days.    Dispense:  90 tablet    Refill:  0    Not a duplicate. Do NOT delete! Dispense 1 day early if closed on refill date. Avoid benzodiazepines within 8 hours of opioids. Do not send refill requests.   oxyCODONE (OXY IR/ROXICODONE) 5 MG immediate release tablet    Sig: Take 1 tablet (5 mg total) by mouth every 8 (eight) hours. Must last 30 days.    Dispense:  90 tablet    Refill:  0    Not a duplicate. Do NOT delete! Dispense 1 day  early if closed on refill date. Avoid benzodiazepines within 8 hours of opioids. Do not send refill requests.    Orders:  No orders of the defined types were placed in this encounter.  Follow-up plan:   Return in about 3 months (around 11/03/2021) for Eval day (M,W), (F2F), (MM).     Interventional Therapies  Risk  Complexity Considerations:   Class III morbid obesity (BMI 42.29 kg/m) Difficulty breathing and tolerating prone position NOTE: PLAVIX Anticoagulation (Stop: 7-10 days  Restart: 2 hours) Chronic CHF  CAD with history of MI  History of alcohol abuse  Chronic kidney disease stage III  COPD  OSA  Carotid artery disease  Abnormal heart rhythm with PVCs during procedure    Planned  Pending:   Possible series of 5 left knee Hyalgan injections    Under consideration:   Pending to order bilateral shoulder x-rays.  Possible need for neurosurgical evaluation.  We will also consider nerve conduction testing of the lower extremities. Diagnostic left L2-3 LESI #1  Diagnostic left L3-4 LESI #1  Diagnostic left L4-5 LESI #1  Diagnostic bilateral L2 TFESI #1  Diagnostic bilateral L3 TFESI #1  Diagnostic bilateral L4 TFESI #1  Diagnostic bilateral lumbar facet block #1  Diagnostic bilateral femoral nerve and obturator nerve block #1  Diagnostic bilateral IA knee joint injection (steroid) #1  Possible series of bilateral IA Hyalgan knee injections  Diagnostic bilateral genicular nerve block #1    Completed:   Diagnostic left L2-3 LESI x1 (11/16/2020) (100/100/100 x2 days/<50) Diagnostic/therapeutic left IA hip injection x2 (11/30/2020) (12/23/2020) Diagnostic/therapeutic left subgluteus maximus bursa injection x1 (11/30/2020)  Therapeutic left IA Hyalgan knee injection x1 (12/23/2020)   Palliative options:   Therapeutic left L2-3 LESI #2     Recent Visits Date Type Provider Dept  06/29/21 Office Visit Milinda Pointer, MD [facility]  06/06/21 Office Visit  Milinda Pointer, MD [facility]  Showing recent visits within past 90 days and meeting all other requirements Today's Visits Date Type Provider Dept  08/02/21 Office Visit Milinda Pointer, MD [facility]  Showing today's visits and meeting all other requirements Future Appointments No visits were found meeting these conditions. Showing future appointments within next 90 days and meeting all other requirements I discussed the assessment and treatment plan with the patient. The patient was provided an opportunity to ask questions and all were answered. The patient  agreed with the plan and demonstrated an understanding of the instructions.  Patient advised to call back or seek an in-person evaluation if the symptoms or condition worsens.  Duration of encounter: 30 minutes.  Note by: Gaspar Cola, MD Date: 08/02/2021; Time: 2:55 PM

## 2021-08-01 ENCOUNTER — Other Ambulatory Visit: Payer: Self-pay

## 2021-08-01 ENCOUNTER — Ambulatory Visit (INDEPENDENT_AMBULATORY_CARE_PROVIDER_SITE_OTHER): Payer: Self-pay | Admitting: Licensed Clinical Social Worker

## 2021-08-01 DIAGNOSIS — Z5329 Procedure and treatment not carried out because of patient's decision for other reasons: Secondary | ICD-10-CM

## 2021-08-01 NOTE — Progress Notes (Signed)
Called pt to connect for 9:00 appt.  Pt answered phone and was very irritable with LCSW counselor "You woke me up!!".  Asked pt if he needed a few minutes to be more awake and alert and pt stated yes, call back in 15 min.  When LCSW counselor returrned call, pt did not pick up phone.

## 2021-08-02 ENCOUNTER — Encounter: Payer: Self-pay | Admitting: Pain Medicine

## 2021-08-02 ENCOUNTER — Ambulatory Visit: Payer: HMO | Attending: Pain Medicine | Admitting: Pain Medicine

## 2021-08-02 ENCOUNTER — Other Ambulatory Visit: Payer: Self-pay

## 2021-08-02 VITALS — BP 122/64 | HR 69 | Temp 97.1°F | Ht 67.0 in | Wt 240.0 lb

## 2021-08-02 DIAGNOSIS — M25561 Pain in right knee: Secondary | ICD-10-CM

## 2021-08-02 DIAGNOSIS — G8929 Other chronic pain: Secondary | ICD-10-CM | POA: Diagnosis present

## 2021-08-02 DIAGNOSIS — M25511 Pain in right shoulder: Secondary | ICD-10-CM | POA: Diagnosis not present

## 2021-08-02 DIAGNOSIS — G894 Chronic pain syndrome: Secondary | ICD-10-CM | POA: Diagnosis not present

## 2021-08-02 DIAGNOSIS — Z79891 Long term (current) use of opiate analgesic: Secondary | ICD-10-CM | POA: Insufficient documentation

## 2021-08-02 DIAGNOSIS — S43014D Anterior dislocation of right humerus, subsequent encounter: Secondary | ICD-10-CM | POA: Insufficient documentation

## 2021-08-02 DIAGNOSIS — S43014A Anterior dislocation of right humerus, initial encounter: Secondary | ICD-10-CM | POA: Insufficient documentation

## 2021-08-02 DIAGNOSIS — M25512 Pain in left shoulder: Secondary | ICD-10-CM | POA: Insufficient documentation

## 2021-08-02 DIAGNOSIS — M25552 Pain in left hip: Secondary | ICD-10-CM | POA: Diagnosis not present

## 2021-08-02 DIAGNOSIS — Z79899 Other long term (current) drug therapy: Secondary | ICD-10-CM | POA: Insufficient documentation

## 2021-08-02 DIAGNOSIS — M25562 Pain in left knee: Secondary | ICD-10-CM

## 2021-08-02 MED ORDER — OXYCODONE HCL 5 MG PO TABS: 5.0000 mg | ORAL_TABLET | Freq: Three times a day (TID) | ORAL | 0 refills | Status: DC

## 2021-08-02 NOTE — Progress Notes (Signed)
Nursing Pain Medication Assessment:  Safety precautions to be maintained throughout the outpatient stay will include: orient to surroundings, keep bed in low position, maintain call bell within reach at all times, provide assistance with transfer out of bed and ambulation.  Medication Inspection Compliance: Pill count conducted under aseptic conditions, in front of the patient. Neither the pills nor the bottle was removed from the patient's sight at any time. Once count was completed pills were immediately returned to the patient in their original bottle.  Medication: Oxycodone IR Pill/Patch Count:  35 of 90 pills remain Pill/Patch Appearance: Markings consistent with prescribed medication Bottle Appearance: Standard pharmacy container. Clearly labeled. Filled Date: 7 / 20 / 2022 Last Medication intake:  Today Safety precautions to be maintained throughout the outpatient stay will include: orient to surroundings, keep bed in low position, maintain call bell within reach at all times, provide assistance with transfer out of bed and ambulation.

## 2021-08-15 ENCOUNTER — Other Ambulatory Visit: Payer: Self-pay | Admitting: Cardiovascular Disease

## 2021-08-26 ENCOUNTER — Ambulatory Visit: Payer: HMO | Admitting: Medical

## 2021-08-26 ENCOUNTER — Other Ambulatory Visit: Payer: Self-pay

## 2021-08-26 ENCOUNTER — Encounter: Payer: Self-pay | Admitting: Medical

## 2021-08-26 VITALS — BP 110/58 | HR 64 | Ht 67.0 in

## 2021-08-26 DIAGNOSIS — R600 Localized edema: Secondary | ICD-10-CM

## 2021-08-26 DIAGNOSIS — R011 Cardiac murmur, unspecified: Secondary | ICD-10-CM

## 2021-08-26 DIAGNOSIS — N189 Chronic kidney disease, unspecified: Secondary | ICD-10-CM

## 2021-08-26 DIAGNOSIS — E785 Hyperlipidemia, unspecified: Secondary | ICD-10-CM

## 2021-08-26 DIAGNOSIS — I5022 Chronic systolic (congestive) heart failure: Secondary | ICD-10-CM | POA: Diagnosis not present

## 2021-08-26 DIAGNOSIS — I2510 Atherosclerotic heart disease of native coronary artery without angina pectoris: Secondary | ICD-10-CM

## 2021-08-26 DIAGNOSIS — R0602 Shortness of breath: Secondary | ICD-10-CM

## 2021-08-26 DIAGNOSIS — R079 Chest pain, unspecified: Secondary | ICD-10-CM

## 2021-08-26 DIAGNOSIS — I10 Essential (primary) hypertension: Secondary | ICD-10-CM

## 2021-08-26 MED ORDER — ISOSORBIDE MONONITRATE ER 60 MG PO TB24: 60.0000 mg | ORAL_TABLET | Freq: Every day | ORAL | 5 refills | Status: DC

## 2021-08-26 MED ORDER — FUROSEMIDE 40 MG PO TABS: 40.0000 mg | ORAL_TABLET | Freq: Every day | ORAL | 5 refills | Status: DC

## 2021-08-26 NOTE — Progress Notes (Signed)
Cardiology Office Note:    Date:  08/26/2021   ID:  [name], DOB 1935-04-06, MRN [id number]  PCP:  Merryl Hacker, No  [facility] Cardiologist:  Kathlyn Sacramento, MD  [facility] Electrophysiologist:  None   Referring MD: No ref. provider found   Chief Complaint: 3-4 month follow-up  History of Present Illness:    [name] is a 86 y.o. male with a hx of CAD, HFrEF s/p DES to distal RCA in 2005 and subsequent stenting in 2020, borderline DM2, HTN, HLD, OSA not on CPAP, morbid obesity, previous tobacco use, CKD stage 3, bipolar disorder, PAD, chronic joint pain on narcotics.   Echo 07/2017 showed EF 40-45%, mild to mod AS, mildly dilated right and left atrium. He was hospitalized in April 2020 with inferior STEMI. Cardiac cath showed 90% proximal and mid RCA disease which was treated with non-overlapping stents. The distal RCA stent was patent with mild ISR. Echo showed an EF 45-50% with severe inferior wall HK.   Last seen 12/2020 and was doing well. Plavix was resumed (had been held for injection).   Today, the patient reports lower leg edema, chest pain and shortness of breath. Swelling has been worse for the last several months. Legs are swollen when he wakes up. He elevates his feet and this helps. Doesn't wear compression socks bc he cant bend down due to broken hip. Tries to eat low salt. Has chronic shortness of breath. He doesn't walk from chronic severe joint pain. He sleep in a chair and reports chronic orthopnea. Chest pain is occasional, center of the chest, pressure. Comes and goes, occurs once a week. Not sure if this is the same as before stenting. He is very sedentary due to chronic pain. No palpitations. He lives by himself and is barely able to take care of himself. Trying to get nurse to come to the home.   Past Medical History:  Diagnosis Date   Alcoholism ([place])    Asthma    Bipolar affective disorder ([place])    Chronic combined systolic and diastolic CHF (congestive  heart failure) ([place])    a. 08/2016 Echo: EF 40-45%, mild AS, mild to mod MR, mildly dil LA/RA, mild-mod TR; b. 07/2017 Echo: EF 40-45%, mod LVH, Gr1 DD, mild to mod AS, mildly dil LA, nl RV fxn; c. 03/2019 Echo: EF 45-50%, AS (not severe). Mod dil PA.   CKD (chronic kidney disease), stage III (HCC)    COPD (chronic obstructive pulmonary disease) (HCC)    Coronary artery disease    a. 01/2004 s/p PCI and Taxus DES to dRCA (3.5 x 12 mm); b. 07/2017 Lexiscan MV: no ischemia. Sm area of apicl thinning, likely attenuation. EF 33% (GI uptake noted)-->Low risk; c. 03/2019 Inf STEMI/PCI: LM nl, LAD min irregs, D1 20ost, RI 20ost, LCX nl, RCA 90p/32m (4.0x26 Resolute Onyx DES), 95m (4.0x15 Resolute Onyx DES), 10d ISR.   Degenerative joint disease    knees and hip   Dyspnea    on exertion   Essential hypertension    Hyperlipidemia    Hypertension    controlled on meds   Ischemic cardiomyopathy    a. 08/2016 Echo: EF 40-45%; b. 07/2017 Echo: EF 40-45%; c. 03/2019 Echo: EF 45-50%.   OSA (obstructive sleep apnea)    Prostate CA (HCC)    prostate ca dx 20 yrs ago   PVD (peripheral vascular disease) (HCC)    Tobacco abuse     Past Surgical History:  Procedure Laterality  Date   CARDIAC CATHETERIZATION     CORONARY ANGIOGRAPHY N/A 04/01/2019   Procedure: CORONARY ANGIOGRAPHY;  Surgeon: Nelva Bush, MD;  Location: [facility];  Service: Cardiovascular;  Laterality: N/A;   CORONARY ANGIOPLASTY WITH STENT PLACEMENT     x2   CORONARY/GRAFT ACUTE MI REVASCULARIZATION N/A 04/01/2019   Procedure: Coronary/Graft Acute MI Revascularization;  Surgeon: Nelva Bush, MD;  Location: [facility];  Service: Cardiovascular;  Laterality: N/A;   TOTAL HIP ARTHROPLASTY     right    Current Medications: Current Meds  Medication Sig   ADULT ASPIRIN REGIMEN 81 MG EC tablet TAKE 1 TABLET BY MOUTH DAILY   albuterol (VENTOLIN HFA) 108 (90 Base) MCG/ACT inhaler USE 2 PUFFS EVERY 4 HOURS AS NEEDED    atorvastatin (LIPITOR) 40 MG tablet Take 40 mg by mouth daily.   azelastine (ASTELIN) 0.1 % nasal spray Place 1 spray into both nostrils daily as needed for allergies.   carvedilol (COREG) 6.25 MG tablet TAKE 1 TABLET BY MOUTH TWICE A DAY   clopidogrel (PLAVIX) 75 MG tablet Take 1 tablet (75 mg total) by mouth daily.   feeding supplement (ENSURE ENLIVE / ENSURE PLUS) LIQD Take 237 mLs by mouth 3 (three) times daily between meals.   fluticasone (FLONASE) 50 MCG/ACT nasal spray Place 2 sprays into both nostrils daily.   furosemide (LASIX) 40 MG tablet Take 1 tablet (40 mg total) by mouth daily.   ipratropium (ATROVENT) 0.03 % nasal spray Place 2 sprays into both nostrils daily.   nitroGLYCERIN (NITROSTAT) 0.4 MG SL tablet Take 0.4 mg by mouth every 5 (five) minutes x 3 doses as needed for chest pain. As needed for chest pain   oxyCODONE (OXY IR/ROXICODONE) 5 MG immediate release tablet Take 1 tablet (5 mg total) by mouth every 8 (eight) hours. Must last 30 days.   [START ON 09/04/2021] oxyCODONE (OXY IR/ROXICODONE) 5 MG immediate release tablet Take 1 tablet (5 mg total) by mouth every 8 (eight) hours. Must last 30 days.   [START ON 10/04/2021] oxyCODONE (OXY IR/ROXICODONE) 5 MG immediate release tablet Take 1 tablet (5 mg total) by mouth every 8 (eight) hours. Must last 30 days.   QUEtiapine (SEROQUEL) 50 MG tablet Take 50 mg by mouth at bedtime.   tamsulosin (FLOMAX) 0.4 MG CAPS capsule Take 0.4 mg by mouth.   TRELEGY ELLIPTA 200-62.5-25 MCG/INH AEPB Inhale 1 puff into the lungs daily.    [DISCONTINUED] amLODipine (NORVASC) 5 MG tablet Take 1 tablet (5 mg total) by mouth daily.   [DISCONTINUED] isosorbide mononitrate (IMDUR) 30 MG 24 hr tablet TAKE 1 TABLET BY MOUTH DAILY     Allergies:   Chlorthalidone, Ace inhibitors, Hydrochlorothiazide, Lisinopril, Losartan, Other, and Benazepril-hydrochlorothiazide   Social History   Socioeconomic History   Marital status: Legally Separated    Spouse  name: Not on file   Number of children: Not on file   Years of education: Not on file   Highest education level: Not on file  Occupational History   Occupation: retired  Tobacco Use   Smoking status: Former    Packs/day: 1.00    Years: 50.00    Pack years: 50.00    Types: Cigarettes   Smokeless tobacco: Never  Vaping Use   Vaping Use: Never used  Substance and Sexual Activity   Alcohol use: Not Currently   Drug use: No   Sexual activity: Never  Other Topics Concern   Not on file  Social History Narrative  Not on file   Social Determinants of Health   Financial Resource Strain: Not on file  Food Insecurity: Not on file  Transportation Needs: Not on file  Physical Activity: Not on file  Stress: Not on file  Social Connections: Not on file     Family History: The patient's family history includes Heart attack in his father and mother; Hyperlipidemia in his father and mother; Hypertension in his father and mother. There is no history of Prostate cancer, Bladder Cancer, or Kidney cancer.  ROS:   Please see the history of present illness.     All other systems reviewed and are negative.  EKGs/Labs/Other Studies Reviewed:    The following studies were reviewed today:  Echo 03/2019  1. Severe hypokinesis of the left ventricular, entire inferior wall and  inferoseptal wall.   2. The left ventricle has mildly reduced systolic function, with an  ejection fraction of 45-50%. The cavity size was normal. There is severely  increased left ventricular wall thickness. Left ventricular diastolic  function could not be evaluated due to  nondiagnostic images.   3. Left atrial size was not well visualized.   4. The mitral valve was not well visualized. There is mild mitral annular  calcification present.   5. The aortic valve is tricuspid. Mild thickening of the aortic valve.  Mild calcification of the aortic valve. Aortic valve regurgitation was not  assessed by color flow  Doppler.   6. Degree of aortic stenosis could not be assessed due to lack of apical  windows for pulse wave Doppler of the LVOT/AoV. However, it does not  appear to be severe.   7. Moderately dilated pulmonary artery.   8. The interatrial septum was not well visualized.   Cardiac cath 03/2019 Conclusion  Conclusions: Severe single-vessel coronary artery disease with sequential 80-95% proximal and mid RCA stenoses with heavy thrombus causing inferior STEMI. Mild, non-obstructive coronary artery disease involving the left coronary artery. Patent distal RCA Taxus stent with mild in-stent restenosis. Challenging but ultimately successful PCI to the proximal and mid RCA lesions with non-overlapping Resolute Onyx 4.0 x 26 mm (proximal - dilated to 4.4 mm) and 4.0 x 15 mm (distal - dilated to 4.2 mm) drug-eluting stents with 0% residual stenosis and TIMI-3 flow.   Recommendations: Admit to ICU, hospitalist service, for post-STEMI monitoring. Dual antiplatelet therapy with aspirin and ticagrelor for at least 12 months, ideally longer given multiple stents in the RCA, including an old Taxus stent. Gentle post-catheterization hydration, given chronic kidney disease and contrast use during today's case. Obtain transthoracic echocardiogram for evaluation of LVEF and aortic stenosis. Aggressive secondary prevention.   Nelva Bush, MD [facility] Pager: [phone]  Coronary Diagrams  Diagnostic Dominance: Right Intervention    EKG:  EKG is  ordered today.  The ekg ordered today demonstrates NSR, 64bpm, PVC, nonspecific T wave changes  Recent Labs: 02/11/2021: TSH 1.388 02/16/2021: Magnesium 2.0 06/20/2021: ALT 9; B Natriuretic Peptide 147.7; BUN 26; Creatinine, Ser 2.19; Hemoglobin 10.3; Platelets 323; Potassium 4.1; Sodium 129  Recent Lipid Panel    Component Value Date/Time   CHOL 104 04/01/2019 0431   CHOL 189 04/26/2016 0810   CHOL 138 05/31/2014 0416   TRIG 30 04/01/2019  0431   TRIG 75 05/31/2014 0416   HDL 43 04/01/2019 0431   HDL 66 04/26/2016 0810   HDL 40 05/31/2014 0416   CHOLHDL 2.4 04/01/2019 0431   VLDL 6 04/01/2019 0431   VLDL 15 05/31/2014 0416  LDLCALC 55 04/01/2019 0431   LDLCALC 97 04/26/2016 0810   LDLCALC 83 05/31/2014 0416     Physical Exam:    VS:  BP (!) 110/58 (BP Location: Left Arm, Patient Position: Sitting, Cuff Size: Normal)   Pulse 64   Ht 5\' 7"  (1.702 m)   SpO2 95%   BMI 37.59 kg/m     Wt Readings from Last 3 Encounters:  08/02/21 240 lb (108.9 kg)  06/29/21 250 lb (113.4 kg)  06/20/21 270 lb (122.5 kg)     GEN:  Well nourished, well developed in no acute distress HEENT: Normal NECK: No JVD; No carotid bruits LYMPHATICS: No lymphadenopathy CARDIAC: RRR, no murmurs, rubs, gallops RESPIRATORY:  Clear to auscultation without rales, wheezing or rhonchi  ABDOMEN: Soft, non-tender, non-distended MUSCULOSKELETAL:  1-2+ edema; No deformity  SKIN: Warm and dry NEUROLOGIC:  Alert and oriented x 3 PSYCHIATRIC:  Normal affect   ASSESSMENT:    1. Chronic systolic CHF (congestive heart failure) ([place])   2. Murmur   3. Hyperlipidemia, unspecified hyperlipidemia type   4. Coronary artery disease involving native coronary artery of native heart without angina pectoris   5. Chronic kidney disease, unspecified CKD stage   6. Essential hypertension   7. Chest pain of uncertain etiology   8. Shortness of breath   9. Lower leg edema    PLAN:    In order of problems listed above:  CAD s/p multiple prior stents to the RCA Reports intermittent chest pain with atypical and typical features. He is sedentay at baseline so difficult to know if chest pain is exertional. Last cath was 2020 when was hospitalized in April 2020 with inferior STEMI. Cardiac cath showed 90% proximal and mid RCA disease which was treated with non-overlapping stents. The distal RCA stent was patent with mild ISR. Echo showed an EF 45-50% with severe  inferior wall HK.  EKG with SR and nonspecific T wave changes. Not a good cath candidate due to CKD. I will increase Imdur to 60mg  daily. Continue Aspirin, statin, BB. Reevaluate symptoms at follow-up, may need Leixscan Myoview.   HFrEF LLE Most recent echo in 2020 showed LVEF 45-50%. He reports worsening lower leg edema for the past few months. He used to be on lasix 40mg  but has since been stopped, unsure as to why. He is sedentary at baseline so unsure if he has dyspnea on exertion. On exam he has 1-2+ LLE. Stop amlodipine for lower leg swelling and reduced EF. I will obtain BMET and BNP today. Restart lasix 40mg  daily. BMET in a week. Discussed daily weights, low salt diet, and leg elevation. Continue Coreg. CKD limiting further GDMT.  HLD Update fasting lipid panel today. Continue Lipitor 40mg  daily.    HTN BP good. Stop amlodipine for LLE and reduced EF. Increase Imdur to 60mg  daily. Continue coreg. He has BP cuff at home and will monitor pressures at home.   CKD stage 3-4 Follows with nephrology. Avoid nephrotoxic agents  Murmur Echo as above  Disposition: Follow up in 6 week(s) with Md/APP   Signed, Brexton Sofia Ninfa Meeker, PA-C  08/26/2021 4:44 PM    [facility]

## 2021-08-26 NOTE — Patient Instructions (Signed)
Medication Instructions:  Your physician has recommended you make the following change in your medication:   -RESUME Furosemide (Lasix) 40 mg daily. An Rx has been sent to your pharmacy.  -STOP Amlodipine  -INCREASE Isosorbide (Imdur) to 60 mg daily. An Rx has been sent to your pharmacy.    *If you need a refill on your cardiac medications before your next appointment, please call your pharmacy*   Lab Work: Bmp, Bnp, Lipid today  Your physician recommends that you return for lab work (Bmp) in: 1 week  If you have labs (blood work) drawn today and your tests are completely normal, you will receive your results only by: [place] (if you have MyChart) OR A paper copy in the mail If you have any lab test that is abnormal or we need to change your treatment, we will call you to review the results.   Testing/Procedures: Your physician has requested that you have an echocardiogram. Echocardiography is a painless test that uses sound waves to create images of your heart. It provides your doctor with information about the size and shape of your heart and how well your heart's chambers and valves are working. This procedure takes approximately one hour. There are no restrictions for this procedure.    Follow-Up: At [facility], you and your health needs are our priority.  As part of our continuing mission to provide you with exceptional heart care, we have created designated Provider Care Teams.  These Care Teams include your primary Cardiologist (physician) and Advanced Practice Providers (APPs -  Physician Assistants and Nurse Practitioners) who all work together to provide you with the care you need, when you need it.  We recommend signing up for the patient portal called "MyChart".  Sign up information is provided on this After Visit Summary.  MyChart is used to connect with patients for Virtual Visits (Telemedicine).  Patients are able to view lab/test results, encounter notes,  upcoming appointments, etc.  Non-urgent messages can be sent to your provider as well.   To learn more about what you can do with MyChart, go to [email or web address].    Your next appointment:   6 week(s)  The format for your next appointment:   In Person  Provider:   You may see Kathlyn Sacramento, MD or one of the following Advanced Practice Providers on your designated Care Team:   Murray Hodgkins, NP Christell Faith, PA-C Marrianne Mood, PA-C Cadence Kathlen Mody, [name]   Other Instructions N/A

## 2021-08-27 LAB — LIPID PANEL
Chol/HDL Ratio: 2.8 ratio (ref 0.0–5.0)
Cholesterol, Total: 105 mg/dL (ref 100–199)
HDL: 38 mg/dL — ABNORMAL LOW (ref >39)
LDL Chol Calc (NIH): 52 mg/dL (ref 0–99)
Triglycerides: 70 mg/dL (ref 0–149)
VLDL Cholesterol Cal: 15 mg/dL (ref 5–40)

## 2021-08-27 LAB — BASIC METABOLIC PANEL
BUN/Creatinine Ratio: 6 — ABNORMAL LOW (ref 10–24)
BUN: 13 mg/dL (ref 8–27)
CO2: 19 mmol/L — ABNORMAL LOW (ref 20–29)
Calcium: 8.6 mg/dL (ref 8.6–10.2)
Chloride: 104 mmol/L (ref 96–106)
Creatinine, Ser: 2.17 mg/dL — ABNORMAL HIGH (ref 0.76–1.27)
Glucose: 87 mg/dL (ref 65–99)
Potassium: 4.6 mmol/L (ref 3.5–5.2)
Sodium: 137 mmol/L (ref 134–144)
eGFR: 29 mL/min/{1.73_m2} — ABNORMAL LOW (ref >59)

## 2021-08-27 LAB — BRAIN NATRIURETIC PEPTIDE: BNP: 125.9 pg/mL — ABNORMAL HIGH (ref 0.0–100.0)

## 2021-08-29 ENCOUNTER — Telehealth: Payer: Self-pay | Admitting: Medical

## 2021-08-29 DIAGNOSIS — R600 Localized edema: Secondary | ICD-10-CM

## 2021-08-29 NOTE — Telephone Encounter (Signed)
Cadence Ninfa Meeker, PA-C  08/29/2021 10:04 AM EDT     Labs showed only mildly elevated heart failure level. He was started on lasix 40mg  daily. pending future labs, may need lasix 20mg . Encourage CHF education with leg elevation, compression socks, low salt. Thanks Can we also check an albumin level.

## 2021-08-29 NOTE — Telephone Encounter (Signed)
I spoke with the patient regarding his lab results. He is aware to continue his current dose of lasix 40 mg once daily. Per the patient, he has not started this yet as his medications are delivered to him and he will get this tonight. I have advised him to start this tomorrow morning.  He is aware to come on Friday 09/02/21 @ 2:00 pm as previously scheduled to repeat his BMP/ add on an albumin level.  He is aware that base on repeat lab work, we may need to adjust his lasix dose.  I have encouraged him to watch his sodium/ fluid intake, elevate legs when possible, use compression socks if able.  The patient voices understanding of the above recommendations and is agreeable.  Albumin order placed.

## 2021-09-02 ENCOUNTER — Other Ambulatory Visit (INDEPENDENT_AMBULATORY_CARE_PROVIDER_SITE_OTHER): Payer: HMO

## 2021-09-02 ENCOUNTER — Other Ambulatory Visit: Payer: Self-pay

## 2021-09-02 DIAGNOSIS — R600 Localized edema: Secondary | ICD-10-CM | POA: Diagnosis not present

## 2021-09-03 LAB — BASIC METABOLIC PANEL
BUN/Creatinine Ratio: 7 — ABNORMAL LOW (ref 10–24)
BUN: 15 mg/dL (ref 8–27)
CO2: 20 mmol/L (ref 20–29)
Calcium: 8.3 mg/dL — ABNORMAL LOW (ref 8.6–10.2)
Chloride: 100 mmol/L (ref 96–106)
Creatinine, Ser: 2.18 mg/dL — ABNORMAL HIGH (ref 0.76–1.27)
Glucose: 94 mg/dL (ref 65–99)
Potassium: 4 mmol/L (ref 3.5–5.2)
Sodium: 137 mmol/L (ref 134–144)
eGFR: 29 mL/min/{1.73_m2} — ABNORMAL LOW (ref >59)

## 2021-09-03 LAB — ALBUMIN: Albumin: 3.2 g/dL — ABNORMAL LOW (ref 3.6–4.6)

## 2021-09-28 ENCOUNTER — Other Ambulatory Visit: Payer: HMO

## 2021-10-07 ENCOUNTER — Ambulatory Visit: Payer: HMO | Admitting: Medical

## 2021-10-26 ENCOUNTER — Other Ambulatory Visit: Payer: Self-pay | Admitting: Pain Medicine

## 2021-11-01 NOTE — Progress Notes (Signed)
PROVIDER NOTE: Information contained herein reflects review and annotations entered in association with encounter. Interpretation of such information and data should be left to medically-trained personnel. Information provided to patient can be located elsewhere in the medical record under "Patient Instructions". Document created using STT-dictation technology, any transcriptional errors that may result from process are unintentional.    Patient: [name]  Service Category: E/M  Provider: Gaspar Cola, MD  DOB: 01/15/1935  DOS: 11/02/2021  Specialty: Interventional Pain Management  MRN: [id number]  Setting: Ambulatory outpatient  PCP: Pcp, No  Type: Established Patient    Referring Provider: No ref. provider found  Location: Office  Delivery: Face-to-face     HPI  [name], a 86 y.o. year old male, is here today because of his No primary diagnosis found.. Mr. Prater primary complain today is Hip Pain, Knee Pain, and Shoulder Pain Last encounter: My last encounter with him was on 10/26/2021. Pertinent problems: Mr. Redditt has [facility]); Lower extremity pain (Bilateral); Chronic pain syndrome; Chronic hip pain (1ry area of Pain) (Left); Unilateral post-traumatic osteoarthritis of hip (Left); Chronic knee pain (2ry area of Pain) (Bilateral); Secondary osteoarthritis of knee (Bilateral); Osteoarthritis of knees (Bilateral); Chronic shoulder pain (3ry area of Pain) (Bilateral); Grade 1 (3 mm) anterolisthesis of lumbar spine (L4-5) (stable); Chronic groin pain (Left); Other intervertebral disc degeneration, lumbar region; Neurogenic pain; Klippel-Feil deformity at the C5-C7 levels; Lumbosacral spondylosis; Osteoarthritis of facet joint of lumbar spine; DDD (degenerative disc disease), lumbosacral; History of total hip replacement (Right); Osteoarthritis of hip (Left); Abnormal MRI, lumbar spine (10/07/2020); Lumbar central spinal stenosis with neurogenic claudication; Lumbar foraminal  stenosis; Polyarthritis with positive rheumatoid factor ([place]); Hip joint effusion (Left); Osteoarthritis of knee (Left); Arthropathy of knee (Left); Chronic knee pain (Left); Hip fracture ([place]); Calcaneal fracture (Right); PVD (peripheral vascular disease) ([place]); Acute pain of right shoulder; Anterior dislocation of right shoulder; and Closed anterior dislocation of shoulder (Right) on their pertinent problem list. Pain Assessment: Severity of   is reported as a 8 /10. Location: Shoulder Right/denies. Onset: More than a month ago. Quality: Aching, Burning, Constant, Discomfort. Timing:  . Modifying factor(s): Denies. Vitals:  height is '5\' 7"'  (1.702 m). His temperature is 97 F (36.1 C) (abnormal). His blood pressure is 127/75 and his pulse is 74. His respiration is 16 and oxygen saturation is 100%.   Reason for encounter: medication management.   The patient indicates doing well with the current medication regimen. No adverse reactions or side effects reported to the medications.  The patient is pending to see his PCP on Friday for evaluation and treatment of a right buttocks boil/pressure sore.  RTCB: 02/01/2022 Nonopioids transfer 11/29/2020: Magnesium, calcium, vitamin D3, and gabapentin.  Pharmacotherapy Assessment  Analgesic: Oxycodone IR 5 mg, 1 tab p.o. daily as needed (23 pills/month, average use) MME/day: 7.5 mg/day   Monitoring: [place] PMP: PDMP reviewed during this encounter.       Pharmacotherapy: No side-effects or adverse reactions reported. Compliance: No problems identified. Effectiveness: Clinically acceptable.  Ignatius Specking, RN  11/02/2021  3:08 PM  Sign when Signing Visit Safety precautions to be maintained throughout the outpatient stay will include: orient to surroundings, keep bed in low position, maintain call bell within reach at all times, provide assistance with transfer out of bed and ambulation.   Patient did not  bring medication with him today. Lat paim medication  taken yesterday.  Patient has boil on right buttocks, Instructed to see MD or  ED if symtoms worse. Daughter at bedside and understands.    UDS:  Summary  Date Value Ref Range Status  06/29/2021 Note  Final    Comment:    ==================================================================== [employer] [facility] [employer] (MW) ==================================================================== Test                             Result       Flag       Units  Drug Present and Declared for Prescription Verification   Oxymorphone                    85           EXPECTED   ng/mg creat    Sources of oxymorphone include scheduled prescription medications;    it is also an expected metabolite of oxycodone.  Drug Absent but Declared for Prescription Verification   Oxycodone                      Not Detected UNEXPECTED ng/mg creat    Oxycodone is almost always present in patients taking this drug    consistently.  Absence of oxycodone could be due to lapse of time    since the last dose or unusual pharmacokinetics (rapid metabolism).  ==================================================================== Test                      Result    Flag   Units      Ref Range   Creatinine              81               mg/dL      >=20 ==================================================================== Declared Medications:  The flagging and interpretation on this report are based on the  following declared medications.  Unexpected results may arise from  inaccuracies in the declared medications.   **Note: The testing scope of this panel includes these medications:   Oxycodone   **Note: The testing scope of this panel does not include the  following reported medications:   Albuterol  Amlodipine  Aspirin  Atorvastatin  Azelastine  Calcium  Carvedilol  Cholecalciferol  Clopidogrel (Plavix)  Fluticasone (Flonase)  Fluticasone (Trelegy)  Furosemide  Ipratropium (Atrovent)  Isosorbide (Imdur)   Magnesium  Multivitamin  Nitroglycerin  Quetiapine  Umeclidinium (Trelegy)  Vilanterol (Trelegy) ==================================================================== For clinical consultation, please call [phone]. ====================================================================      ROS  Constitutional: Denies any fever or chills Gastrointestinal: No reported hemesis, hematochezia, vomiting, or acute GI distress Musculoskeletal: Denies any acute onset joint swelling, redness, loss of ROM, or weakness Neurological: No reported episodes of acute onset apraxia, aphasia, dysarthria, agnosia, amnesia, paralysis, loss of coordination, or loss of consciousness  Medication Review  Fluticasone-Umeclidin-Vilant, Magnesium Oxide, QUEtiapine, Vitamin D3, albuterol, aspirin, atorvastatin, azelastine, calcium carbonate, carvedilol, clopidogrel, feeding supplement, fluticasone, furosemide, ipratropium, isosorbide mononitrate, lubiprostone, nitroGLYCERIN, oxyCODONE, and tamsulosin  History Review  Allergy: Mr. Erazo is allergic to chlorthalidone, ace inhibitors, hydrochlorothiazide, lisinopril, losartan, other, and benazepril-hydrochlorothiazide. Drug: Mr. Ramthun  reports no history of drug use. Alcohol:  reports that he does not currently use alcohol. Tobacco:  reports that he has quit smoking. His smoking use included cigarettes. He has a 50.00 pack-year smoking history. He has never used smokeless tobacco. Social: Mr. Liew  reports that he has quit smoking. His smoking use included cigarettes. He has a 50.00 pack-year smoking history. He has  never used smokeless tobacco. He reports that he does not currently use alcohol. He reports that he does not use drugs. Medical:  has a past medical history of Alcoholism ([place]), Asthma, Bipolar affective disorder ([place]), Chronic combined systolic and diastolic CHF (congestive heart failure) ([place]), CKD (chronic kidney disease), stage III ([place]), COPD (chronic  obstructive pulmonary disease) ([place]), Coronary artery disease, Degenerative joint disease, Dyspnea, Essential hypertension, Hyperlipidemia, Hypertension, Ischemic cardiomyopathy, OSA (obstructive sleep apnea), Prostate CA ([place]), PVD (peripheral vascular disease) ([place]), and Tobacco abuse. Surgical: Mr. Diana  has a past surgical history that includes Total hip arthroplasty; Coronary/Graft Acute MI Revascularization (N/A, 04/01/2019); CORONARY ANGIOGRAPHY (N/A, 04/01/2019); Cardiac catheterization; and Coronary angioplasty with stent. Family: family history includes Heart attack in his father and mother; Hyperlipidemia in his father and mother; Hypertension in his father and mother.  Laboratory Chemistry Profile   Renal Lab Results  Component Value Date   BUN 15 09/02/2021   CREATININE 2.18 (H) 09/02/2021   LABCREA 124 05/29/2017   BCR 7 (L) 09/02/2021   GFRAA 30 (L) 09/20/2020   GFRNONAA 29 (L) 06/20/2021    Hepatic Lab Results  Component Value Date   AST 18 06/20/2021   ALT 9 06/20/2021   ALBUMIN 3.2 (L) 09/02/2021   ALKPHOS 123 06/20/2021   HCVAB NEGATIVE 10/31/2010   AMYLASE 56 11/01/2010   LIPASE 48 07/31/2017   AMMONIA 11 02/11/2021    Electrolytes Lab Results  Component Value Date   NA 137 09/02/2021   K 4.0 09/02/2021   CL 100 09/02/2021   CALCIUM 8.3 (L) 09/02/2021   MG 2.0 02/16/2021   PHOS 4.6 01/10/2021    Bone Lab Results  Component Value Date   VD25OH 52.12 01/09/2021   25OHVITD1 16 (L) 09/20/2020   25OHVITD2 <1.0 09/20/2020   25OHVITD3 15 09/20/2020    Inflammation (CRP: Acute Phase) (ESR: Chronic Phase) Lab Results  Component Value Date   CRP 5.7 (H) 02/16/2021   ESRSEDRATE 87 (H) 01/09/2021   LATICACIDVEN 1.0 10/30/2010         Note: Above Lab results reviewed.  Recent Imaging Review  DG Shoulder Right CLINICAL DATA:  Chronic right shoulder pain, worse this morning. No shoulder injury.  EXAM: RIGHT SHOULDER - 2+ VIEW  COMPARISON:  Portable  chest dated 06/20/2021.  FINDINGS: Somewhat oblique views of the right shoulder suggest interval anterior and medial dislocation of the humeral head relative to the glenoid. This is not seen on the previous portable chest radiograph with better positioning of the shoulder in the frontal projection on that radiograph. Glenohumeral and acromioclavicular spur formation. No visible fractures.  IMPRESSION: Possible interval anterior shoulder dislocation. Degenerative changes.  These results will be called to the ordering clinician or representative by the Radiologist Assistant, and communication documented in the PACS or [employer].  Electronically Signed   By: Claudie Revering M.D.   On: 07/01/2021 21:11 Note: Reviewed        Physical Exam  General appearance: Well nourished, well developed, and well hydrated. In no apparent acute distress Mental status: Alert, oriented x 3 (person, place, & time)       Respiratory: No evidence of acute respiratory distress Eyes: PERLA Vitals: BP 127/75   Pulse 74   Temp (!) 97 F (36.1 C)   Resp 16   Ht '5\' 7"'  (1.702 m)   SpO2 100%   BMI 37.59 kg/m  BMI: Estimated body mass index is 37.59 kg/m as calculated from the following:   Height  as of this encounter: '5\' 7"'  (1.702 m).   Weight as of 08/02/21: 240 lb (108.9 kg). Ideal: Patient weight not recorded  Assessment   Status Diagnosis  Controlled Controlled Controlled 1. Chronic pain syndrome   2. Chronic hip pain (1ry area of Pain) (Left)   3. Pharmacologic therapy   4. Chronic use of opiate for therapeutic purpose   5. Chronic knee pain (2ry area of Pain) (Bilateral)   6. Chronic shoulder pain (3ry area of Pain) (Bilateral)   7. Encounter for medication management      Updated Problems: No problems updated.  Plan of Care  Problem-specific:  No problem-specific Assessment & Plan notes found for this encounter.  Mr. WESTLY HINNANT has a current medication list which includes  the following long-term medication(s): albuterol, carvedilol, furosemide, furosemide, ipratropium, isosorbide mononitrate, nitroglycerin, [START ON 11/03/2021] oxycodone, [START ON 12/03/2021] oxycodone, [START ON 01/02/2022] oxycodone, quetiapine, calcium carbonate, magnesium oxide, and [DISCONTINUED] vitamin d3.  Pharmacotherapy (Medications Ordered): Meds ordered this encounter  Medications   oxyCODONE (OXY IR/ROXICODONE) 5 MG immediate release tablet    Sig: Take 1 tablet (5 mg total) by mouth every 8 (eight) hours. Must last 30 days.    Dispense:  90 tablet    Refill:  0    DO NOT: delete (not duplicate); no partial-fill (will deny script to complete), no refill request (F/U required). DISPENSE: 1 day early if closed on fill date. WARN: No CNS-depressants within 8 hrs of med.   oxyCODONE (OXY IR/ROXICODONE) 5 MG immediate release tablet    Sig: Take 1 tablet (5 mg total) by mouth every 8 (eight) hours. Must last 30 days.    Dispense:  90 tablet    Refill:  0    DO NOT: delete (not duplicate); no partial-fill (will deny script to complete), no refill request (F/U required). DISPENSE: 1 day early if closed on fill date. WARN: No CNS-depressants within 8 hrs of med.   oxyCODONE (OXY IR/ROXICODONE) 5 MG immediate release tablet    Sig: Take 1 tablet (5 mg total) by mouth every 8 (eight) hours. Must last 30 days.    Dispense:  90 tablet    Refill:  0    DO NOT: delete (not duplicate); no partial-fill (will deny script to complete), no refill request (F/U required). DISPENSE: 1 day early if closed on fill date. WARN: No CNS-depressants within 8 hrs of med.    Orders:  No orders of the defined types were placed in this encounter.  Follow-up plan:   Return in about 13 weeks (around 02/01/2022) for Eval-day (M,W), (F2F), (MM).     Interventional Therapies  Risk  Complexity Considerations:   Estimated body mass index is 37.59 kg/m as calculated from the following:   Height as of this  encounter: '5\' 7"'  (1.702 m).   Weight as of 08/02/21: 240 lb (108.9 kg). Class III morbid obesity (BMI 42.29 kg/m) Difficulty breathing and tolerating prone position NOTE: PLAVIX Anticoagulation (Stop: 7-10 days  Restart: 2 hours) Chronic CHF  CAD with history of MI  History of alcohol abuse  Chronic kidney disease stage III  COPD  OSA  Carotid artery disease  Abnormal heart rhythm with PVCs during procedure    Planned  Pending:   Pending further evaluation   Under consideration:   Diagnostic left L2-3 LESI #1  Diagnostic left L3-4 LESI #1  Diagnostic left L4-5 LESI #1  Diagnostic bilateral L2 TFESI #1  Diagnostic bilateral L3 TFESI #1  Diagnostic bilateral L4 TFESI #1  Diagnostic bilateral lumbar facet block #1  Diagnostic bilateral femoral nerve and obturator nerve block #1  Diagnostic bilateral IA knee joint injection (steroid) #1  Possible series of bilateral IA Hyalgan knee injections  Diagnostic bilateral genicular nerve block #1    Completed:   Diagnostic left L2-3 LESI x1 (11/16/2020) (100/100/100 x2 days/<50) Diagnostic/therapeutic left IA hip injection x2 (11/30/2020) (12/23/2020) Diagnostic/therapeutic left subgluteus maximus bursa injection x1 (11/30/2020)  Therapeutic left IA Hyalgan knee injection x1 (12/23/2020)   Therapeutic  Palliative (PRN) options:   Therapeutic left L2-3 LESI #2     Recent Visits No visits were found meeting these conditions. Showing recent visits within past 90 days and meeting all other requirements Today's Visits Date Type Provider Dept  11/02/21 Office Visit Milinda Pointer, MD [facility]  Showing today's visits and meeting all other requirements Future Appointments No visits were found meeting these conditions. Showing future appointments within next 90 days and meeting all other requirements I discussed the assessment and treatment plan with the patient. The patient was provided an opportunity to ask questions and  all were answered. The patient agreed with the plan and demonstrated an understanding of the instructions.  Patient advised to call back or seek an in-person evaluation if the symptoms or condition worsens.  Duration of encounter: 30 minutes.  Note by: Gaspar Cola, MD Date: 11/02/2021; Time: 3:11 PM

## 2021-11-02 ENCOUNTER — Other Ambulatory Visit: Payer: Self-pay

## 2021-11-02 ENCOUNTER — Encounter: Payer: Self-pay | Admitting: Pain Medicine

## 2021-11-02 ENCOUNTER — Ambulatory Visit: Payer: HMO | Attending: Pain Medicine | Admitting: Pain Medicine

## 2021-11-02 DIAGNOSIS — M25512 Pain in left shoulder: Secondary | ICD-10-CM | POA: Diagnosis present

## 2021-11-02 DIAGNOSIS — M25562 Pain in left knee: Secondary | ICD-10-CM | POA: Diagnosis present

## 2021-11-02 DIAGNOSIS — Z79899 Other long term (current) drug therapy: Secondary | ICD-10-CM | POA: Insufficient documentation

## 2021-11-02 DIAGNOSIS — M25561 Pain in right knee: Secondary | ICD-10-CM | POA: Diagnosis present

## 2021-11-02 DIAGNOSIS — Z79891 Long term (current) use of opiate analgesic: Secondary | ICD-10-CM | POA: Diagnosis not present

## 2021-11-02 DIAGNOSIS — G8929 Other chronic pain: Secondary | ICD-10-CM | POA: Diagnosis present

## 2021-11-02 DIAGNOSIS — G894 Chronic pain syndrome: Secondary | ICD-10-CM | POA: Diagnosis not present

## 2021-11-02 DIAGNOSIS — M25511 Pain in right shoulder: Secondary | ICD-10-CM | POA: Insufficient documentation

## 2021-11-02 DIAGNOSIS — M25552 Pain in left hip: Secondary | ICD-10-CM | POA: Diagnosis not present

## 2021-11-02 MED ORDER — OXYCODONE HCL 5 MG PO TABS: 5.0000 mg | ORAL_TABLET | Freq: Three times a day (TID) | ORAL | 0 refills | Status: DC

## 2021-11-02 NOTE — Progress Notes (Signed)
Safety precautions to be maintained throughout the outpatient stay will include: orient to surroundings, keep bed in low position, maintain call bell within reach at all times, provide assistance with transfer out of bed and ambulation.   Patient did not  bring medication with him today. Lat paim medication taken yesterday.  Patient has boil on right buttocks, Instructed to see MD or ED if symtoms worse. Daughter at bedside and understands.

## 2021-11-18 ENCOUNTER — Other Ambulatory Visit: Payer: HMO

## 2021-11-23 ENCOUNTER — Emergency Department: Payer: HMO

## 2021-11-23 ENCOUNTER — Other Ambulatory Visit (HOSPITAL_COMMUNITY): Payer: Self-pay

## 2021-11-23 ENCOUNTER — Encounter: Payer: Self-pay | Admitting: Internal Medicine

## 2021-11-23 ENCOUNTER — Inpatient Hospital Stay
Admission: EM | Admit: 2021-11-23 | Discharge: 2021-12-05 | DRG: 872 | Disposition: A | Discharge status: Skilled Nursing Facility | Payer: HMO | Attending: Internal Medicine | Admitting: Internal Medicine

## 2021-11-23 DIAGNOSIS — Z83438 Family history of other disorder of lipoprotein metabolism and other lipidemia: Secondary | ICD-10-CM

## 2021-11-23 DIAGNOSIS — F319 Bipolar disorder, unspecified: Secondary | ICD-10-CM | POA: Diagnosis present

## 2021-11-23 DIAGNOSIS — N3001 Acute cystitis with hematuria: Secondary | ICD-10-CM

## 2021-11-23 DIAGNOSIS — I130 Hypertensive heart and chronic kidney disease with heart failure and stage 1 through stage 4 chronic kidney disease, or unspecified chronic kidney disease: Secondary | ICD-10-CM | POA: Diagnosis present

## 2021-11-23 DIAGNOSIS — Z66 Do not resuscitate: Secondary | ICD-10-CM | POA: Diagnosis present

## 2021-11-23 DIAGNOSIS — G4733 Obstructive sleep apnea (adult) (pediatric): Secondary | ICD-10-CM | POA: Diagnosis present

## 2021-11-23 DIAGNOSIS — I255 Ischemic cardiomyopathy: Secondary | ICD-10-CM | POA: Diagnosis present

## 2021-11-23 DIAGNOSIS — Z8546 Personal history of malignant neoplasm of prostate: Secondary | ICD-10-CM

## 2021-11-23 DIAGNOSIS — M25552 Pain in left hip: Secondary | ICD-10-CM

## 2021-11-23 DIAGNOSIS — N1832 Chronic kidney disease, stage 3b: Secondary | ICD-10-CM | POA: Diagnosis present

## 2021-11-23 DIAGNOSIS — A4181 Sepsis due to Enterococcus: Secondary | ICD-10-CM | POA: Diagnosis not present

## 2021-11-23 DIAGNOSIS — E1151 Type 2 diabetes mellitus with diabetic peripheral angiopathy without gangrene: Secondary | ICD-10-CM | POA: Diagnosis present

## 2021-11-23 DIAGNOSIS — B952 Enterococcus as the cause of diseases classified elsewhere: Secondary | ICD-10-CM | POA: Diagnosis not present

## 2021-11-23 DIAGNOSIS — Z96641 Presence of right artificial hip joint: Secondary | ICD-10-CM | POA: Diagnosis present

## 2021-11-23 DIAGNOSIS — N390 Urinary tract infection, site not specified: Secondary | ICD-10-CM | POA: Diagnosis present

## 2021-11-23 DIAGNOSIS — Z6839 Body mass index (BMI) 39.0-39.9, adult: Secondary | ICD-10-CM | POA: Diagnosis not present

## 2021-11-23 DIAGNOSIS — Z955 Presence of coronary angioplasty implant and graft: Secondary | ICD-10-CM

## 2021-11-23 DIAGNOSIS — N320 Bladder-neck obstruction: Secondary | ICD-10-CM | POA: Diagnosis present

## 2021-11-23 DIAGNOSIS — J449 Chronic obstructive pulmonary disease, unspecified: Secondary | ICD-10-CM | POA: Diagnosis present

## 2021-11-23 DIAGNOSIS — Z79899 Other long term (current) drug therapy: Secondary | ICD-10-CM

## 2021-11-23 DIAGNOSIS — E039 Hypothyroidism, unspecified: Secondary | ICD-10-CM | POA: Diagnosis present

## 2021-11-23 DIAGNOSIS — E1122 Type 2 diabetes mellitus with diabetic chronic kidney disease: Secondary | ICD-10-CM | POA: Diagnosis present

## 2021-11-23 DIAGNOSIS — E785 Hyperlipidemia, unspecified: Secondary | ICD-10-CM | POA: Diagnosis present

## 2021-11-23 DIAGNOSIS — F0393 Unspecified dementia, unspecified severity, with mood disturbance: Secondary | ICD-10-CM | POA: Diagnosis present

## 2021-11-23 DIAGNOSIS — F03911 Unspecified dementia, unspecified severity, with agitation: Secondary | ICD-10-CM | POA: Diagnosis present

## 2021-11-23 DIAGNOSIS — I2510 Atherosclerotic heart disease of native coronary artery without angina pectoris: Secondary | ICD-10-CM | POA: Diagnosis present

## 2021-11-23 DIAGNOSIS — Z1611 Resistance to penicillins: Secondary | ICD-10-CM | POA: Diagnosis present

## 2021-11-23 DIAGNOSIS — N179 Acute kidney failure, unspecified: Secondary | ICD-10-CM | POA: Diagnosis present

## 2021-11-23 DIAGNOSIS — D631 Anemia in chronic kidney disease: Secondary | ICD-10-CM | POA: Diagnosis present

## 2021-11-23 DIAGNOSIS — I10 Essential (primary) hypertension: Secondary | ICD-10-CM | POA: Diagnosis present

## 2021-11-23 DIAGNOSIS — A419 Sepsis, unspecified organism: Secondary | ICD-10-CM | POA: Diagnosis present

## 2021-11-23 DIAGNOSIS — Z888 Allergy status to other drugs, medicaments and biological substances status: Secondary | ICD-10-CM

## 2021-11-23 DIAGNOSIS — N401 Enlarged prostate with lower urinary tract symptoms: Secondary | ICD-10-CM | POA: Diagnosis present

## 2021-11-23 DIAGNOSIS — R112 Nausea with vomiting, unspecified: Secondary | ICD-10-CM

## 2021-11-23 DIAGNOSIS — Z6838 Body mass index (BMI) 38.0-38.9, adult: Secondary | ICD-10-CM | POA: Diagnosis not present

## 2021-11-23 DIAGNOSIS — Z20822 Contact with and (suspected) exposure to covid-19: Secondary | ICD-10-CM | POA: Diagnosis present

## 2021-11-23 DIAGNOSIS — Z7982 Long term (current) use of aspirin: Secondary | ICD-10-CM

## 2021-11-23 DIAGNOSIS — R7881 Bacteremia: Secondary | ICD-10-CM | POA: Diagnosis not present

## 2021-11-23 DIAGNOSIS — R338 Other retention of urine: Secondary | ICD-10-CM | POA: Diagnosis present

## 2021-11-23 DIAGNOSIS — E871 Hypo-osmolality and hyponatremia: Secondary | ICD-10-CM | POA: Diagnosis not present

## 2021-11-23 DIAGNOSIS — Z635 Disruption of family by separation and divorce: Secondary | ICD-10-CM

## 2021-11-23 DIAGNOSIS — E669 Obesity, unspecified: Secondary | ICD-10-CM | POA: Diagnosis present

## 2021-11-23 DIAGNOSIS — Z8249 Family history of ischemic heart disease and other diseases of the circulatory system: Secondary | ICD-10-CM

## 2021-11-23 DIAGNOSIS — R319 Hematuria, unspecified: Secondary | ICD-10-CM | POA: Diagnosis present

## 2021-11-23 DIAGNOSIS — Z7902 Long term (current) use of antithrombotics/antiplatelets: Secondary | ICD-10-CM

## 2021-11-23 DIAGNOSIS — Z87891 Personal history of nicotine dependence: Secondary | ICD-10-CM

## 2021-11-23 DIAGNOSIS — R339 Retention of urine, unspecified: Secondary | ICD-10-CM | POA: Diagnosis not present

## 2021-11-23 DIAGNOSIS — L8990 Pressure ulcer of unspecified site, unspecified stage: Secondary | ICD-10-CM | POA: Insufficient documentation

## 2021-11-23 DIAGNOSIS — R609 Edema, unspecified: Secondary | ICD-10-CM

## 2021-11-23 DIAGNOSIS — I5042 Chronic combined systolic (congestive) and diastolic (congestive) heart failure: Secondary | ICD-10-CM | POA: Diagnosis present

## 2021-11-23 DIAGNOSIS — Z923 Personal history of irradiation: Secondary | ICD-10-CM

## 2021-11-23 DIAGNOSIS — R310 Gross hematuria: Secondary | ICD-10-CM

## 2021-11-23 LAB — URINALYSIS, COMPLETE (UACMP) WITH MICROSCOPIC
Bilirubin Urine: NEGATIVE
Glucose, UA: NEGATIVE mg/dL
Ketones, ur: NEGATIVE mg/dL
Nitrite: NEGATIVE
Protein, ur: 100 mg/dL — AB
RBC / HPF: >50 RBC/hpf (ref 0–5)
Specific Gravity, Urine: 1.015 (ref 1.005–1.030)
Squamous Epithelial / HPF: NONE SEEN (ref 0–5)
WBC, UA: >50 WBC/hpf (ref 0–5)
pH: 7 (ref 5.0–8.0)

## 2021-11-23 LAB — CBC WITH DIFFERENTIAL/PLATELET
Abs Immature Granulocytes: 0.05 10*3/uL (ref 0.00–0.07)
Basophils Absolute: 0 10*3/uL (ref 0.0–0.1)
Basophils Relative: 0 %
Eosinophils Absolute: 0.1 10*3/uL (ref 0.0–0.5)
Eosinophils Relative: 2 %
HCT: 30.8 % — ABNORMAL LOW (ref 39.0–52.0)
Hemoglobin: 9.8 g/dL — ABNORMAL LOW (ref 13.0–17.0)
Immature Granulocytes: 1 %
Lymphocytes Relative: 17 %
Lymphs Abs: 1.5 10*3/uL (ref 0.7–4.0)
MCH: 26.6 pg (ref 26.0–34.0)
MCHC: 31.8 g/dL (ref 30.0–36.0)
MCV: 83.5 fL (ref 80.0–100.0)
Monocytes Absolute: 0.8 10*3/uL (ref 0.1–1.0)
Monocytes Relative: 9 %
Neutro Abs: 6.2 10*3/uL (ref 1.7–7.7)
Neutrophils Relative %: 71 %
Platelets: 309 10*3/uL (ref 150–400)
RBC: 3.69 MIL/uL — ABNORMAL LOW (ref 4.22–5.81)
RDW: 15.1 % (ref 11.5–15.5)
WBC: 8.7 10*3/uL (ref 4.0–10.5)
nRBC: 0 % (ref 0.0–0.2)

## 2021-11-23 LAB — BASIC METABOLIC PANEL
Anion gap: 7 (ref 5–15)
BUN: 20 mg/dL (ref 8–23)
CO2: 22 mmol/L (ref 22–32)
Calcium: 8.4 mg/dL — ABNORMAL LOW (ref 8.9–10.3)
Chloride: 101 mmol/L (ref 98–111)
Creatinine, Ser: 2.1 mg/dL — ABNORMAL HIGH (ref 0.61–1.24)
GFR, Estimated: 30 mL/min — ABNORMAL LOW (ref >60)
Glucose, Bld: 87 mg/dL (ref 70–99)
Potassium: 4.7 mmol/L (ref 3.5–5.1)
Sodium: 130 mmol/L — ABNORMAL LOW (ref 135–145)

## 2021-11-23 LAB — LACTIC ACID, PLASMA
Lactic Acid, Venous: 2.5 mmol/L (ref 0.5–1.9)
Lactic Acid, Venous: 2.8 mmol/L (ref 0.5–1.9)

## 2021-11-23 LAB — RESP PANEL BY RT-PCR (FLU A&B, COVID) ARPGX2
Influenza A by PCR: NEGATIVE
Influenza B by PCR: NEGATIVE
SARS Coronavirus 2 by RT PCR: NEGATIVE

## 2021-11-23 LAB — PROTIME-INR
INR: 1.4 — ABNORMAL HIGH (ref 0.8–1.2)
Prothrombin Time: 17 seconds — ABNORMAL HIGH (ref 11.4–15.2)

## 2021-11-23 MED ORDER — TAMSULOSIN HCL 0.4 MG PO CAPS: 0.4000 mg | ORAL_CAPSULE | Freq: Every day | ORAL | Status: DC

## 2021-11-23 MED ORDER — ASPIRIN EC 81 MG PO TBEC
81.0000 mg | DELAYED_RELEASE_TABLET | Freq: Every day | ORAL | Status: DC
  Administered 2021-11-23 – 2021-12-05 (×13): 81 mg via ORAL
  Filled 2021-11-23 (×14): qty 1

## 2021-11-23 MED ORDER — LACTATED RINGERS IV SOLN: INTRAVENOUS | Status: AC

## 2021-11-23 MED ORDER — FLUTICASONE-UMECLIDIN-VILANT 200-62.5-25 MCG/ACT IN AEPB: 1.0000 | INHALATION_SPRAY | Freq: Every day | RESPIRATORY_TRACT | Status: DC

## 2021-11-23 MED ORDER — SODIUM CHLORIDE 0.9 % IV SOLN
1.0000 g | Freq: Once | INTRAVENOUS | Status: AC
  Administered 2021-11-23: 1 g via INTRAVENOUS
  Filled 2021-11-23: qty 10

## 2021-11-23 MED ORDER — ONDANSETRON HCL 4 MG/2ML IJ SOLN
4.0000 mg | Freq: Once | INTRAMUSCULAR | Status: AC
  Administered 2021-11-23: 4 mg via INTRAVENOUS
  Filled 2021-11-23: qty 2

## 2021-11-23 MED ORDER — NITROGLYCERIN 0.4 MG SL SUBL: 0.4000 mg | SUBLINGUAL_TABLET | SUBLINGUAL | Status: DC | PRN

## 2021-11-23 MED ORDER — VANCOMYCIN HCL IN DEXTROSE 1-5 GM/200ML-% IV SOLN
1000.0000 mg | INTRAVENOUS | Status: DC
Start: 2021-11-25 — End: 2021-11-24

## 2021-11-23 MED ORDER — ONDANSETRON HCL 4 MG/2ML IJ SOLN
4.0000 mg | Freq: Four times a day (QID) | INTRAMUSCULAR | Status: DC | PRN
  Administered 2021-11-23: 4 mg via INTRAVENOUS
  Filled 2021-11-23: qty 2

## 2021-11-23 MED ORDER — ASPIRIN EC 81 MG PO TBEC: 81.0000 mg | DELAYED_RELEASE_TABLET | Freq: Every day | ORAL | Status: DC

## 2021-11-23 MED ORDER — LUBIPROSTONE 8 MCG PO CAPS
8.0000 ug | ORAL_CAPSULE | Freq: Two times a day (BID) | ORAL | Status: DC
  Administered 2021-11-23 – 2021-12-05 (×22): 8 ug via ORAL
  Filled 2021-11-23 (×28): qty 1

## 2021-11-23 MED ORDER — ACETAMINOPHEN 650 MG RE SUPP: 650.0000 mg | Freq: Four times a day (QID) | RECTAL | Status: DC | PRN

## 2021-11-23 MED ORDER — TAMSULOSIN HCL 0.4 MG PO CAPS
0.4000 mg | ORAL_CAPSULE | Freq: Every day | ORAL | Status: DC
  Administered 2021-11-23 – 2021-12-05 (×13): 0.4 mg via ORAL
  Filled 2021-11-23 (×13): qty 1

## 2021-11-23 MED ORDER — ENSURE ENLIVE PO LIQD
237.0000 mL | Freq: Three times a day (TID) | ORAL | Status: DC
Start: 2021-11-23 — End: 2021-12-05
  Administered 2021-11-24 – 2021-12-04 (×22): 237 mL via ORAL

## 2021-11-23 MED ORDER — LACTATED RINGERS IV SOLN: Freq: Once | INTRAVENOUS | Status: AC

## 2021-11-23 MED ORDER — CLOPIDOGREL BISULFATE 75 MG PO TABS: 75.0000 mg | ORAL_TABLET | Freq: Every day | ORAL | Status: DC

## 2021-11-23 MED ORDER — QUETIAPINE FUMARATE 25 MG PO TABS
50.0000 mg | ORAL_TABLET | Freq: Every day | ORAL | Status: DC
  Administered 2021-11-24 – 2021-12-04 (×11): 50 mg via ORAL
  Filled 2021-11-23 (×11): qty 2

## 2021-11-23 MED ORDER — OXYCODONE HCL 5 MG PO TABS
5.0000 mg | ORAL_TABLET | Freq: Once | ORAL | Status: AC
  Administered 2021-11-23: 5 mg via ORAL
  Filled 2021-11-23: qty 1

## 2021-11-23 MED ORDER — SODIUM CHLORIDE 0.9 % IV BOLUS
500.0000 mL | Freq: Once | INTRAVENOUS | Status: AC
  Administered 2021-11-23: 500 mL via INTRAVENOUS

## 2021-11-23 MED ORDER — ONDANSETRON HCL 4 MG PO TABS: 4.0000 mg | ORAL_TABLET | Freq: Four times a day (QID) | ORAL | Status: DC | PRN

## 2021-11-23 MED ORDER — IPRATROPIUM BROMIDE 0.03 % NA SOLN
2.0000 | Freq: Every day | NASAL | Status: DC
  Administered 2021-11-24 – 2021-12-05 (×11): 2 via NASAL
  Filled 2021-11-23 (×2): qty 30

## 2021-11-23 MED ORDER — ACETAMINOPHEN 325 MG PO TABS
650.0000 mg | ORAL_TABLET | Freq: Four times a day (QID) | ORAL | Status: DC | PRN
  Administered 2021-11-27 – 2021-11-29 (×2): 650 mg via ORAL
  Filled 2021-11-23 (×3): qty 2

## 2021-11-23 MED ORDER — SODIUM CHLORIDE 0.9 % IV SOLN: 2.0000 g | INTRAVENOUS | Status: DC

## 2021-11-23 MED ORDER — VANCOMYCIN HCL 1500 MG/300ML IV SOLN
1500.0000 mg | Freq: Once | INTRAVENOUS | Status: AC
Start: 2021-11-23 — End: 2021-11-23
  Administered 2021-11-23: 1500 mg via INTRAVENOUS
  Filled 2021-11-23: qty 300

## 2021-11-23 MED ORDER — ALBUTEROL SULFATE (2.5 MG/3ML) 0.083% IN NEBU: 3.0000 mL | INHALATION_SOLUTION | RESPIRATORY_TRACT | Status: DC | PRN

## 2021-11-23 MED ORDER — FLUTICASONE PROPIONATE 50 MCG/ACT NA SUSP
2.0000 | Freq: Every day | NASAL | Status: DC | PRN
  Filled 2021-11-23: qty 16

## 2021-11-23 MED ORDER — FLUTICASONE FUROATE-VILANTEROL 200-25 MCG/ACT IN AEPB
1.0000 | INHALATION_SPRAY | Freq: Every day | RESPIRATORY_TRACT | Status: DC
  Administered 2021-11-24 – 2021-12-05 (×12): 1 via RESPIRATORY_TRACT
  Filled 2021-11-23: qty 28

## 2021-11-23 MED ORDER — VANCOMYCIN HCL IN DEXTROSE 1-5 GM/200ML-% IV SOLN
1000.0000 mg | Freq: Once | INTRAVENOUS | Status: AC
  Administered 2021-11-23: 1000 mg via INTRAVENOUS
  Filled 2021-11-23: qty 200

## 2021-11-23 MED ORDER — OXYCODONE HCL 5 MG PO TABS
5.0000 mg | ORAL_TABLET | Freq: Three times a day (TID) | ORAL | Status: DC
  Administered 2021-11-23 – 2021-12-01 (×24): 5 mg via ORAL
  Filled 2021-11-23 (×24): qty 1

## 2021-11-23 MED ORDER — CEPHALEXIN 500 MG PO CAPS: 500.0000 mg | ORAL_CAPSULE | Freq: Once | ORAL | Status: DC

## 2021-11-23 MED ORDER — FOSFOMYCIN TROMETHAMINE 3 G PO PACK: 3.0000 g | PACK | Freq: Once | ORAL | Status: DC

## 2021-11-23 MED ORDER — MORPHINE SULFATE (PF) 4 MG/ML IV SOLN
4.0000 mg | INTRAVENOUS | Status: DC | PRN
  Administered 2021-11-23 (×2): 4 mg via INTRAMUSCULAR
  Filled 2021-11-23 (×2): qty 1

## 2021-11-23 MED ORDER — UMECLIDINIUM BROMIDE 62.5 MCG/ACT IN AEPB
1.0000 | INHALATION_SPRAY | Freq: Every day | RESPIRATORY_TRACT | Status: DC
  Administered 2021-11-24 – 2021-12-05 (×12): 1 via RESPIRATORY_TRACT
  Filled 2021-11-23: qty 7

## 2021-11-23 MED ORDER — ATORVASTATIN CALCIUM 20 MG PO TABS
40.0000 mg | ORAL_TABLET | Freq: Every evening | ORAL | Status: DC
  Administered 2021-11-23 – 2021-12-04 (×12): 40 mg via ORAL
  Filled 2021-11-23 (×12): qty 2

## 2021-11-23 MED ORDER — SODIUM CHLORIDE 0.9 % IV SOLN
2.0000 g | INTRAVENOUS | Status: DC
  Administered 2021-11-24: 05:00:00 2 g via INTRAVENOUS
  Filled 2021-11-23 (×2): qty 20

## 2021-11-23 MED ORDER — CLOPIDOGREL BISULFATE 75 MG PO TABS
75.0000 mg | ORAL_TABLET | Freq: Every day | ORAL | Status: DC
  Administered 2021-11-23 – 2021-12-05 (×13): 75 mg via ORAL
  Filled 2021-11-23 (×13): qty 1

## 2021-11-23 MED ORDER — LACTATED RINGERS IV SOLN: INTRAVENOUS | Status: DC

## 2021-11-23 NOTE — ED Notes (Signed)
52F coude foley placed by Melissa, RN with approx 175ml of clear red fluid return. After initial urine/fluid drainage from the catheter, it stopped draining. Bladder scan performed and >696 ml's of urine remain in the bladder. Attempted to flush catheter without success. Foley removed by Lorriane Shire, RN. While removing, A moderate amount of bright red blood began to pour from the patient's penis. Dr. Leonides Schanz notified and aware. Urology contacted.

## 2021-11-23 NOTE — ED Notes (Signed)
Urology at bedside fore catheter insertion

## 2021-11-23 NOTE — ED Notes (Signed)
MD messaged in regard to pt's BP 89/61 with a MAP of 72.

## 2021-11-23 NOTE — Progress Notes (Signed)
Pharmacy Antibiotic Note  [name] is a 86 y.o. male admitted on 11/23/2021.  Pharmacy has been consulted for vancomycin dosing for UTI. Pt with history of ampicillin resistant Enterococcus in urine culture.   Plan: Vanomycin 2500 mg IV x1 loading dose followed by 1000 mg IV q36h Est AUC: 479.5 Used: Scr 2.1, IBW, Vd 0.5 Obtain vanc levels around 4th or 5th dose if continued Monitor renal function and adjust dose as clinically indicated  Height: 5\' 7"  (170.2 cm) Weight: 112.5 kg (248 lb) IBW/kg (Calculated) : 66.1  Temp (24hrs), Avg:99.1 F (37.3 C), Min:99.1 F (37.3 C), Max:99.1 F (37.3 C)  Recent Labs  Lab 11/23/21 0518 11/23/21 0955  WBC 8.7  --   CREATININE 2.10*  --   LATICACIDVEN  --  2.5*    Estimated Creatinine Clearance: 31.4 mL/min (A) (by C-G formula based on SCr of 2.1 mg/dL (H)).    Allergies  Allergen Reactions   Chlorthalidone Other (See Comments)    Hyponatremia   Ace Inhibitors Cough   Hydrochlorothiazide    Lisinopril Other (See Comments)   Losartan    Other Other (See Comments)    "ANY BLOOD PRESSURE MEDICATIONS THAT I'VE TRIED" - PT. DOES NOT REMEMBER WHICH ONES "ANY BLOOD PRESSURE MEDICATIONS THAT I'VE TRIED"- PT. DOES NOT REMEMBER WHICH ONES- CONSTIPATION   Benazepril-Hydrochlorothiazide Other (See Comments)    Constipation    Antimicrobials this admission: 12/7 ceftriaxone >>  12/7 vancomycin >>   Microbiology results: 12/7 BCx: sent 12/7 UCx: sent   Thank you for allowing pharmacy to be a part of this patient's care.  Forde Dandy Donis Kotowski 11/23/2021 12:21 PM

## 2021-11-23 NOTE — H&P (Signed)
History and Physical    [name] JJO:[id number] DOB: January 10, 1935 DOA: 11/23/2021  PCP: Pcp, No   Patient coming from: Home  I have personally briefly reviewed patient's old medical records in [place]  Chief Complaint: Difficulty voiding  HPI: [name] is a 86 y.o. male with medical history significant for chronic combined systolic and diastolic dysfunction CHF, stage III chronic kidney disease, coronary artery disease as well as COPD, bipolar disorder, history of prostate cancer treated with radiation therapy 1994, status post penile prosthesis in 2010 with revision in 2014 who presents to the ER via EMS for evaluation difficulty voiding over the last 24 hours. Patient states that he had an episode of gross hematuria on the day of his admission.  He also complained of severe suprapubic pain that he rated a 10 x 10 in intensity at its worst. He was seen in the ER and had a bladder scan which showed greater than 669 mL of urine. The ER staff attempted to place a Foley catheter without success so urology was consulted and was able to place a Foley catheter in the ER with drainage of purulent urine. He has had chills at home but denies having any fever.  He has had episodes of nausea and vomiting.  He also has lower extremity swelling.  He denies having any cough, no chest pain, no shortness of breath, no dizziness, no lightheadedness, no headache, no changes in his bowel habits, no blurred vision, no focal deficits. Sodium 130, potassium 4.7, chloride 101, bicarb 22, glucose 87, BUN 20, creatinine 2.10, calcium 8.4, lactic acid 2.5, white count 8.7, hemoglobin 9.8, hematocrit 30.8, MCV 83.5, RDW 15.1, platelet count 309, PT 17.0, INR 1.4 Urine analysis shows pyuria Renal stone CT shows no acute abnormality in the abdomen or pelvis.  No hydronephrosis.  Bilateral renal cysts. Twelve-lead EKG reviewed by me shows normal sinus rhythm     ED Course: Patient is an 86 year old male  who presents to the ER for evaluation of acute urinary retention. Attempts were made by the ER staff to place a Foley catheter but they were unsuccessful and so urology was consulted and he was able to place a 12 [place] coud catheter.  Patient noted to have purulent urine and previous urine culture yielded ampicillin resistant Enterococcus faecalis. Patient has a low-grade fever with a T-max of 99.1, he is tachycardic, has relative hypotension and lactic acid is elevated at 2.5. He will be admitted to the hospital for further evaluation.   Review of Systems: As per HPI otherwise all other systems reviewed and negative.    Past Medical History:  Diagnosis Date   Alcoholism ([place])    Asthma    Bipolar affective disorder ([place])    Chronic combined systolic and diastolic CHF (congestive heart failure) ([place])    a. 08/2016 Echo: EF 40-45%, mild AS, mild to mod MR, mildly dil LA/RA, mild-mod TR; b. 07/2017 Echo: EF 40-45%, mod LVH, Gr1 DD, mild to mod AS, mildly dil LA, nl RV fxn; c. 03/2019 Echo: EF 45-50%, AS (not severe). Mod dil PA.   CKD (chronic kidney disease), stage III (HCC)    COPD (chronic obstructive pulmonary disease) (HCC)    Coronary artery disease    a. 01/2004 s/p PCI and Taxus DES to dRCA (3.5 x 12 mm); b. 07/2017 Lexiscan MV: no ischemia. Sm area of apicl thinning, likely attenuation. EF 33% (GI uptake noted)-->Low risk; c. 03/2019 Inf STEMI/PCI: LM nl, LAD min irregs,  D1 20ost, RI 20ost, LCX nl, RCA 90p/38m (4.0x26 Resolute Onyx DES), 40m (4.0x15 Resolute Onyx DES), 10d ISR.   Degenerative joint disease    knees and hip   Dyspnea    on exertion   Essential hypertension    Hyperlipidemia    Hypertension    controlled on meds   Ischemic cardiomyopathy    a. 08/2016 Echo: EF 40-45%; b. 07/2017 Echo: EF 40-45%; c. 03/2019 Echo: EF 45-50%.   OSA (obstructive sleep apnea)    Prostate CA (HCC)    prostate ca dx 20 yrs ago   PVD (peripheral vascular disease) ([place])    Tobacco abuse      Past Surgical History:  Procedure Laterality Date   CARDIAC CATHETERIZATION     CORONARY ANGIOGRAPHY N/A 04/01/2019   Procedure: CORONARY ANGIOGRAPHY;  Surgeon: Nelva Bush, MD;  Location: [facility];  Service: Cardiovascular;  Laterality: N/A;   CORONARY ANGIOPLASTY WITH STENT PLACEMENT     x2   CORONARY/GRAFT ACUTE MI REVASCULARIZATION N/A 04/01/2019   Procedure: Coronary/Graft Acute MI Revascularization;  Surgeon: Nelva Bush, MD;  Location: [facility];  Service: Cardiovascular;  Laterality: N/A;   TOTAL HIP ARTHROPLASTY     right     reports that he has quit smoking. His smoking use included cigarettes. He has a 50.00 pack-year smoking history. He has never used smokeless tobacco. He reports that he does not currently use alcohol. He reports that he does not use drugs.  Allergies  Allergen Reactions   Chlorthalidone Other (See Comments)    Hyponatremia   Ace Inhibitors Cough   Hydrochlorothiazide    Lisinopril Other (See Comments)   Losartan    Other Other (See Comments)    "ANY BLOOD PRESSURE MEDICATIONS THAT I'VE TRIED" - PT. DOES NOT REMEMBER WHICH ONES "ANY BLOOD PRESSURE MEDICATIONS THAT I'VE TRIED"- PT. DOES NOT REMEMBER WHICH ONES- CONSTIPATION   Benazepril-Hydrochlorothiazide Other (See Comments)    Constipation    Family History  Problem Relation Age of Onset   Hypertension Mother    Hyperlipidemia Mother    Heart attack Mother    Hypertension Father    Hyperlipidemia Father    Heart attack Father    Prostate cancer Neg Hx    Bladder Cancer Neg Hx    Kidney cancer Neg Hx       Prior to Admission medications   Medication Sig Start Date End Date Taking? Authorizing Provider  ADULT ASPIRIN REGIMEN 81 MG EC tablet TAKE 1 TABLET BY MOUTH DAILY 03/08/20   [name], MD  albuterol (VENTOLIN HFA) 108 (90 Base) MCG/ACT inhaler USE 2 PUFFS EVERY 4 HOURS AS NEEDED 10/14/19   Darylene Price A, FNP  atorvastatin (LIPITOR) 40 MG  tablet Take 40 mg by mouth daily.    [provider]  azelastine (ASTELIN) 0.1 % nasal spray Place 1 spray into both nostrils daily as needed for allergies.    [provider]  calcium carbonate (OSCAL) 1500 (600 Ca) MG TABS tablet Take 1 tablet (1,500 mg total) by mouth 2 (two) times daily with a meal. Patient not taking: Reported on 08/26/2021 11/29/20 06/06/21  Milinda Pointer, MD  carvedilol (COREG) 6.25 MG tablet TAKE 1 TABLET BY MOUTH TWICE A DAY 08/15/21   [name], MD  clopidogrel (PLAVIX) 75 MG tablet Take 1 tablet (75 mg total) by mouth daily. 10/28/19   [name], MD  feeding supplement (ENSURE ENLIVE / ENSURE PLUS) LIQD Take 237  mLs by mouth 3 (three) times daily between meals. 01/13/21   Lorella Nimrod, MD  fluticasone (FLONASE) 50 MCG/ACT nasal spray Place 2 sprays into both nostrils daily.    [provider]  furosemide (LASIX) 40 MG tablet TAKE 1 TABLET BY MOUTH DAILY AND TAKE ANADDITIONAL TABLET IN THE AFTERNOON AS NEEDED FOR WEIGHT GAIN AND LEG EDEMA 04/21/21   [name], MD  furosemide (LASIX) 40 MG tablet Take 1 tablet (40 mg total) by mouth daily. 08/26/21 11/24/21  Furth, Cadence H, PA-C  ipratropium (ATROVENT) 0.03 % nasal spray Place 2 sprays into both nostrils daily. 05/07/20   [provider]  isosorbide mononitrate (IMDUR) 60 MG 24 hr tablet Take 1 tablet (60 mg total) by mouth daily. 08/26/21   Furth, Cadence H, PA-C  lubiprostone (AMITIZA) 8 MCG capsule Take 8 mcg by mouth 2 (two) times daily. 08/24/21   [provider]  Magnesium Oxide 500 MG CAPS Take 1 capsule (500 mg total) by mouth daily. Patient not taking: Reported on 08/26/2021 11/29/20 02/27/21  Milinda Pointer, MD  nitroGLYCERIN (NITROSTAT) 0.4 MG SL tablet Take 0.4 mg by mouth every 5 (five) minutes x 3 doses as needed for chest pain. As needed for chest pain 12/06/15   [provider]  oxyCODONE (OXY IR/ROXICODONE) 5 MG immediate release tablet  Take 1 tablet (5 mg total) by mouth every 8 (eight) hours. Must last 30 days. 11/03/21 12/03/21  Milinda Pointer, MD  oxyCODONE (OXY IR/ROXICODONE) 5 MG immediate release tablet Take 1 tablet (5 mg total) by mouth every 8 (eight) hours. Must last 30 days. 12/03/21 01/02/22  Milinda Pointer, MD  oxyCODONE (OXY IR/ROXICODONE) 5 MG immediate release tablet Take 1 tablet (5 mg total) by mouth every 8 (eight) hours. Must last 30 days. 01/02/22 02/01/22  Milinda Pointer, MD  QUEtiapine (SEROQUEL) 50 MG tablet Take 50 mg by mouth at bedtime.    [provider]  tamsulosin (FLOMAX) 0.4 MG CAPS capsule Take 0.4 mg by mouth.    [provider]  Donnal Debar 200-62.5-25 MCG/INH AEPB Inhale 1 puff into the lungs daily.  04/08/20   [provider]  Cholecalciferol (VITAMIN D3) 125 MCG (5000 UT) CAPS Take 1 capsule (5,000 Units total) by mouth daily with breakfast. Take along with calcium and magnesium. Patient not taking: Reported on 01/27/2021 11/29/20 01/28/21  Milinda Pointer, MD    Physical Exam: Vitals:   11/23/21 0930 11/23/21 1000 11/23/21 1130 11/23/21 1200  BP: 128/60 115/60 (!) 109/96 114/66  Pulse: (!) 107 (!) 125 (!) 110 (!) 109  Resp:  18 20 16   Temp:      TempSrc:      SpO2: 95% 92% 95% 96%  Weight:      Height:         Vitals:   11/23/21 0930 11/23/21 1000 11/23/21 1130 11/23/21 1200  BP: 128/60 115/60 (!) 109/96 114/66  Pulse: (!) 107 (!) 125 (!) 110 (!) 109  Resp:  18 20 16   Temp:      TempSrc:      SpO2: 95% 92% 95% 96%  Weight:      Height:          Constitutional: Lethargic but arouses to verbal stimuli. Not in any apparent distress HEENT:      Head: Normocephalic and atraumatic.         Eyes: PERLA, EOMI, Conjunctivae are normal. Sclera is non-icteric.       Mouth/Throat: Mucous membranes are moist.  Neck: Supple with no signs of meningismus. Cardiovascular: Tachycardic. No murmurs, gallops, or rubs. 2+ symmetrical distal  pulses are present . No JVD. + LE edema Respiratory: Respiratory effort normal .Lungs sounds clear bilaterally. No wheezes, crackles, or rhonchi.  Gastrointestinal: Soft, suprapubic tenderness, and non distended with positive bowel sounds.  Genitourinary: No CVA tenderness.  Foley catheter in place draining purulent urine Musculoskeletal: Nontender with normal range of motion in all extremities. No cyanosis, or erythema of extremities. Neurologic:  Face is symmetric. Moving all extremities. No gross focal neurologic deficits.  Generalized weakness Skin: Skin is warm, dry.  No rash or ulcers Psychiatric: Mood and affect are normal    Labs on Admission: I have personally reviewed following labs and imaging studies  CBC: Recent Labs  Lab 11/23/21 0518  WBC 8.7  NEUTROABS 6.2  HGB 9.8*  HCT 30.8*  MCV 83.5  PLT 017   Basic Metabolic Panel: Recent Labs  Lab 11/23/21 0518  NA 130*  K 4.7  CL 101  CO2 22  GLUCOSE 87  BUN 20  CREATININE 2.10*  CALCIUM 8.4*   GFR: Estimated Creatinine Clearance: 31.4 mL/min (A) (by C-G formula based on SCr of 2.1 mg/dL (H)). Liver Function Tests: No results for input(s): AST, ALT, ALKPHOS, BILITOT, PROT, ALBUMIN in the last 168 hours. No results for input(s): LIPASE, AMYLASE in the last 168 hours. No results for input(s): AMMONIA in the last 168 hours. Coagulation Profile: Recent Labs  Lab 11/23/21 0518  INR 1.4*   Cardiac Enzymes: No results for input(s): CKTOTAL, CKMB, CKMBINDEX, TROPONINI in the last 168 hours. BNP (last 3 results) No results for input(s): PROBNP in the last 8760 hours. HbA1C: No results for input(s): HGBA1C in the last 72 hours. CBG: No results for input(s): GLUCAP in the last 168 hours. Lipid Profile: No results for input(s): CHOL, HDL, LDLCALC, TRIG, CHOLHDL, LDLDIRECT in the last 72 hours. Thyroid Function Tests: No results for input(s): TSH, T4TOTAL, FREET4, T3FREE, THYROIDAB in the last 72 hours. Anemia  Panel: No results for input(s): VITAMINB12, FOLATE, FERRITIN, TIBC, IRON, RETICCTPCT in the last 72 hours. Urine analysis:    Component Value Date/Time   COLORURINE AMBER (A) 11/23/2021 0741   APPEARANCEUR HAZY (A) 11/23/2021 0741   APPEARANCEUR Clear 06/14/2018 1341   LABSPEC 1.015 11/23/2021 0741   LABSPEC 1.004 04/03/2015 1216   PHURINE 7.0 11/23/2021 0741   GLUCOSEU NEGATIVE 11/23/2021 0741   GLUCOSEU Negative 04/03/2015 1216   HGBUR LARGE (A) 11/23/2021 0741   BILIRUBINUR NEGATIVE 11/23/2021 0741   BILIRUBINUR Negative 06/14/2018 1341   BILIRUBINUR Negative 04/03/2015 1216   KETONESUR NEGATIVE 11/23/2021 0741   PROTEINUR 100 (A) 11/23/2021 0741   UROBILINOGEN 0.2 03/27/2011 0845   NITRITE NEGATIVE 11/23/2021 0741   LEUKOCYTESUR LARGE (A) 11/23/2021 0741   LEUKOCYTESUR Negative 04/03/2015 1216    Radiological Exams on Admission: DG Chest Portable 1 View  Result Date: 11/23/2021 CLINICAL DATA:  Weakness, evaluate for edema EXAM: PORTABLE CHEST 1 VIEW COMPARISON:  Chest radiograph 06/20/2021 FINDINGS: The heart is enlarged. The upper mediastinal contours are within normal limits. There are diffusely increased interstitial markings bilaterally likely reflecting mild pulmonary interstitial edema. There is no focal consolidation. There is no pleural effusion or pneumothorax There is no acute osseous abnormality. IMPRESSION: Cardiomegaly with mild pulmonary interstitial edema. Electronically Signed   By: Valetta Mole M.D.   On: 11/23/2021 11:44   CT Renal Stone Study  Result Date: 11/23/2021 CLINICAL DATA:  Flank pain, kidney stone suspected.  Urinary retention. EXAM: CT ABDOMEN AND PELVIS WITHOUT CONTRAST TECHNIQUE: Multidetector CT imaging of the abdomen and pelvis was performed following the standard protocol without IV contrast. COMPARISON:  CT abdomen and pelvis 08/05/2017 FINDINGS: Lower chest: Mild bibasilar lung scarring. No pleural effusion. Three-vessel coronary  atherosclerosis. Hepatobiliary: No focal liver abnormality is seen. No gallstones, gallbladder wall thickening, or biliary dilatation. Pancreas: Unremarkable. Spleen: Calcified granulomas. Adrenals/Urinary Tract: Unremarkable adrenal glands. Bilateral low-density renal lesions compatible with cysts measuring up to 5.1 cm on the right and 4.4 cm on the left with some of the cysts having at most mildly enlarged since 2018. No renal calculi or hydronephrosis. Vascular calcifications in both renal hila. No ureteral calculi or ureteral dilatation. Foley catheter in the bladder which is largely collapsed limiting evaluation. Stomach/Bowel: The stomach is unremarkable. There is no evidence of bowel obstruction or inflammation. Predominantly left-sided colonic diverticulosis is noted without evidence of acute diverticulitis. The appendix is unremarkable. Vascular/Lymphatic: Abdominal aortic atherosclerosis without aneurysm. No enlarged lymph nodes. Reproductive: Partially visualized penile implant with reservoir in the left pelvis. Unremarkable prostate. Other: No ascites or pneumoperitoneum. Musculoskeletal: Right hip arthroplasty. ORIF of the left femur with extensive erosion/resorption of the left femoral head, largely new from 2018 but similar in appearance to 02/11/2021 pelvic radiographs. IMPRESSION: 1. No acute abnormality identified in the abdomen or pelvis. No hydronephrosis. 2. Bilateral renal cysts. 3. Colonic diverticulosis. 4. Aortic Atherosclerosis (ICD10-I70.0). Electronically Signed   By: Logan Bores M.D.   On: 11/23/2021 11:19     Assessment/Plan Principal Problem:   Sepsis secondary to UTI [facility]) Active Problems:   CAD (coronary artery disease), native coronary artery   Essential hypertension   COPD (chronic obstructive pulmonary disease) (HCC)   Anemia in stage 3b chronic kidney disease (HCC)   Hypothyroidism   Type 2 diabetes mellitus with diabetic chronic kidney disease ([place])     Sepsis  secondary to UTI ([place]) As evidenced by low-grade fever with a T-max of 99.1, tachycardia, relative hypotension, pyuria and elevated lactic acid level of 2.5. Aggressive IV fluid resuscitation Patient's prior urine culture yielded ampicillin resistant Enterococcus faecalis Will place patient empirically on vancomycin and Rocephin pending results of urine culture Follow-up results of blood cultures      BPH with obstructive symptoms Patient is status post Foley catheter insertion Continue Flomax    Coronary artery disease Continue aspirin, Plavix and statins     Obesity (BMI 38) Complicates overall prognosis and care    History of chronic combined systolic and diastolic dysfunction CHF Hold carvedilol, furosemide and nitrates due to relative hypotension    COPD Not acutely exacerbated Continue as needed bronchodilator, Trelegy and inhaled steroids    DVT prophylaxis: SCD Code Status: DO NOT RESUSCITATE Family Communication: Greater than 50% of time was spent discussing patient's condition and plan of care with him at the bedside.  All questions and concerns have been addressed.  He verbalizes understanding and agrees to the plan.  CODE STATUS was discussed and he is a DNR Disposition Plan: Back to previous home environment Consults called: none  Status:At the time of admission, it appears that the appropriate admission status for this patient is inpatient. This is judged to be reasonable and necessary to provide the required intensity of service to ensure the patient's safety given the presenting symptoms, physical exam findings, and initial radiographic and laboratory data in the context of their comorbid conditions. Patient requires inpatient status due to high intensity of service, high risk for further  deterioration and high frequency of surveillance required.     Collier Bullock MD [facility]     11/23/2021, 12:30 PM

## 2021-11-23 NOTE — ED Triage Notes (Signed)
Symptoms started yesterday. He was unable to urinate even though he felt his bladder was full.  Urinated this morning and there was straight blood. Has hx of prostate ca.

## 2021-11-23 NOTE — Consult Note (Signed)
Urology Consult  Requesting physician: Dr. Leonides Schanz  Reason for consultation: Urinary retention with inability to place Foley catheter  Chief Complaint: Unable to urinate  History of Present Illness: [name] is a 86 y.o. male who presented to the ED complaining of inability to void  States acute onset yesterday Noted a small amount of blood in his urine yesterday.  Told ED physician he can put a small piece of metal in his urethra"relieve the pressure" Last urologic history remarkable for prostate cancer mid 1990s status post radiation ED status post penile implant at [facility] and 2014 and revision 2019 Multiple attempts at catheter placement in ED without success   Past Medical History:  Diagnosis Date   Alcoholism ([place])    Asthma    Bipolar affective disorder ([place])    Chronic combined systolic and diastolic CHF (congestive heart failure) ([place])    a. 08/2016 Echo: EF 40-45%, mild AS, mild to mod MR, mildly dil LA/RA, mild-mod TR; b. 07/2017 Echo: EF 40-45%, mod LVH, Gr1 DD, mild to mod AS, mildly dil LA, nl RV fxn; c. 03/2019 Echo: EF 45-50%, AS (not severe). Mod dil PA.   CKD (chronic kidney disease), stage III (HCC)    COPD (chronic obstructive pulmonary disease) (HCC)    Coronary artery disease    a. 01/2004 s/p PCI and Taxus DES to dRCA (3.5 x 12 mm); b. 07/2017 Lexiscan MV: no ischemia. Sm area of apicl thinning, likely attenuation. EF 33% (GI uptake noted)-->Low risk; c. 03/2019 Inf STEMI/PCI: LM nl, LAD min irregs, D1 20ost, RI 20ost, LCX nl, RCA 90p/52m (4.0x26 Resolute Onyx DES), 33m (4.0x15 Resolute Onyx DES), 10d ISR.   Degenerative joint disease    knees and hip   Dyspnea    on exertion   Essential hypertension    Hyperlipidemia    Hypertension    controlled on meds   Ischemic cardiomyopathy    a. 08/2016 Echo: EF 40-45%; b. 07/2017 Echo: EF 40-45%; c. 03/2019 Echo: EF 45-50%.   OSA (obstructive sleep apnea)    Prostate CA (HCC)    prostate ca dx 20 yrs ago   PVD  (peripheral vascular disease) ([place])    Tobacco abuse     Past Surgical History:  Procedure Laterality Date   CARDIAC CATHETERIZATION     CORONARY ANGIOGRAPHY N/A 04/01/2019   Procedure: CORONARY ANGIOGRAPHY;  Surgeon: Nelva Bush, MD;  Location: [facility];  Service: Cardiovascular;  Laterality: N/A;   CORONARY ANGIOPLASTY WITH STENT PLACEMENT     x2   CORONARY/GRAFT ACUTE MI REVASCULARIZATION N/A 04/01/2019   Procedure: Coronary/Graft Acute MI Revascularization;  Surgeon: Nelva Bush, MD;  Location: [facility];  Service: Cardiovascular;  Laterality: N/A;   TOTAL HIP ARTHROPLASTY     right    Home Medications:  No outpatient medications have been marked as taking for the 11/23/21 encounter [facility] Encounter).    Allergies:  Allergies  Allergen Reactions   Chlorthalidone Other (See Comments)    Hyponatremia   Ace Inhibitors Cough   Hydrochlorothiazide    Lisinopril Other (See Comments)   Losartan    Other Other (See Comments)    "ANY BLOOD PRESSURE MEDICATIONS THAT I'VE TRIED" - PT. DOES NOT REMEMBER WHICH ONES "ANY BLOOD PRESSURE MEDICATIONS THAT I'VE TRIED"- PT. DOES NOT REMEMBER WHICH ONES- CONSTIPATION   Benazepril-Hydrochlorothiazide Other (See Comments)    Constipation    Family History  Problem Relation Age of Onset   Hypertension Mother  Hyperlipidemia Mother    Heart attack Mother    Hypertension Father    Hyperlipidemia Father    Heart attack Father    Prostate cancer Neg Hx    Bladder Cancer Neg Hx    Kidney cancer Neg Hx     Social History:  reports that he has quit smoking. His smoking use included cigarettes. He has a 50.00 pack-year smoking history. He has never used smokeless tobacco. He reports that he does not currently use alcohol. He reports that he does not use drugs.  ROS: A complete review of systems was performed.  All systems are negative except for pertinent findings as noted.  Physical Exam:  Vital signs  in last 24 hours: Temp:  [99.1 F (37.3 C)] 99.1 F (37.3 C) (12/07 0429) Pulse Rate:  [77-94] 88 (12/07 0700) Resp:  [18-21] 21 (12/07 0700) BP: (158-177)/(78-91) 171/91 (12/07 0700) SpO2:  [95 %-98 %] 98 % (12/07 0700) Weight:  [112.5 kg] 112.5 kg (12/07 0430) Constitutional:  Alert, moderate distress secondary to retention HEENT: [place] AT, moist mucus membranes.  Trachea midline, no masses Cardiovascular: Regular rate and rhythm, no clubbing, cyanosis, or edema. Respiratory: Normal respiratory effort, lungs clear bilaterally GI: Abdomen is soft, nontender, nondistended, no abdominal masses GU: Phallus uncircumcised.  No palpable abnormalities.  Blood urethral meatus and on sheets Neurologic: Grossly intact, no focal deficits, moving all 4 extremities   Laboratory Data:  Recent Labs    11/23/21 0518  WBC 8.7  HGB 9.8*  HCT 30.8*   Recent Labs    11/23/21 0518  NA 130*  K 4.7  CL 101  CO2 22  GLUCOSE 87  BUN 20  CREATININE 2.10*  CALCIUM 8.4*   Recent Labs    11/23/21 0518  INR 1.4*   No results for input(s): LABURIN in the last 72 hours. Results for orders placed or performed during the hospital encounter of 02/11/21  Resp Panel by RT-PCR (Flu A&B, Covid) Nasopharyngeal Swab     Status: Abnormal   Collection Time: 02/11/21  9:46 AM   Specimen: Nasopharyngeal Swab; Nasopharyngeal(NP) swabs in vial transport medium  Result Value Ref Range Status   SARS Coronavirus 2 by RT PCR POSITIVE (A) NEGATIVE Final    Comment: RESULT CALLED TO, READ BACK BY AND VERIFIED WITH:  LAURA HERNANDEZ AT 1104 02/11/21 SDR (NOTE) SARS-CoV-2 target nucleic acids are DETECTED.  The SARS-CoV-2 RNA is generally detectable in upper respiratory specimens during the acute phase of infection. Positive results are indicative of the presence of the identified virus, but do not rule out bacterial infection or co-infection with other pathogens not detected by the test. Clinical correlation with  patient history and other diagnostic information is necessary to determine patient infection status. The expected result is Negative.  Fact Sheet for Patients: EntrepreneurPulse.com.au  Fact Sheet for Healthcare Providers: IncredibleEmployment.be  This test is not yet approved or cleared by the [place] FDA and  has been authorized for detection and/or diagnosis of SARS-CoV-2 by FDA under an Emergency Use Authorization (EUA).  This EUA will remain in effect (meaning this test can  be used) for the duration of  the COVID-19 declaration under Section 564(b)(1) of the Act, 21 U.S.C. section 360bbb-3(b)(1), unless the authorization is terminated or revoked sooner.     Influenza A by PCR NEGATIVE NEGATIVE Final   Influenza B by PCR NEGATIVE NEGATIVE Final    Comment: (NOTE) The Xpert Xpress SARS-CoV-2/FLU/RSV plus assay is intended as an aid in  the diagnosis of influenza from Nasopharyngeal swab specimens and should not be used as a sole basis for treatment. Nasal washings and aspirates are unacceptable for Xpert Xpress SARS-CoV-2/FLU/RSV testing.  Fact Sheet for Patients: EntrepreneurPulse.com.au  Fact Sheet for Healthcare Providers: IncredibleEmployment.be  This test is not yet approved or cleared by the [place] FDA and has been authorized for detection and/or diagnosis of SARS-CoV-2 by FDA under an Emergency Use Authorization (EUA). This EUA will remain in effect (meaning this test can be used) for the duration of the COVID-19 declaration under Section 564(b)(1) of the Act, 21 U.S.C. section 360bbb-3(b)(1), unless the authorization is terminated or revoked.  Performed at [facility], [address] [place], [place] [address]   Blood culture (routine x 2)     Status: None   Collection Time: 02/11/21  4:43 PM   Specimen: BLOOD  Result Value Ref Range Status   Specimen Description  BLOOD LEFT ANTECUBITAL  Final   Special Requests   Final    BOTTLES DRAWN AEROBIC AND ANAEROBIC Blood Culture adequate volume   Culture   Final    NO GROWTH 5 DAYS Performed at [facility], [address], [place], [place] [address]    Report Status 02/16/2021 FINAL  Final  Blood culture (routine x 2)     Status: None   Collection Time: 02/11/21  4:44 PM   Specimen: BLOOD  Result Value Ref Range Status   Specimen Description BLOOD BLOOD LEFT HAND  Final   Special Requests   Final    BOTTLES DRAWN AEROBIC AND ANAEROBIC Blood Culture adequate volume   Culture   Final    NO GROWTH 5 DAYS Performed at [facility], [address], [place], [place] [address]    Report Status 02/16/2021 FINAL  Final   A 12 French Coloplast coud catheter was passed per urethra.  Mild resistance in region of bladder neck which then "popped" then advanced.  Catheter irrigated with return of amber, slightly cloudy urine.  Balloon inflated with 10 mL sterile water and placed to gravity drainage.   Impression/Assessment:  Urinary retention most likely secondary to stricture/bladder neck contracture History of prostate cancer status post radiation  Plan:  Indwelling Foley x7 days Office follow-up next week for cystoscopy Urine was sent for culture Recommend oral antibiotics at discharge due to multiple catheter attempts and slightly cloudy urine   11/23/2021, 8:18 AM  John Giovanni,  MD

## 2021-11-23 NOTE — ED Notes (Signed)
Lab called to collect blood specimens.

## 2021-11-23 NOTE — TOC Benefit Eligibility Note (Signed)
Patient Advocate Encounter   Received notification that prior authorization for Linezolid 600 mg tablets is required.   PA submitted on 11/23/2021 Key [id number] Status is pending       Lyndel Safe, [place] Patient Advocate Specialist [place] Patient Advocate Team Direct Number: [phone]  Fax: [phone]

## 2021-11-23 NOTE — TOC Benefit Eligibility Note (Signed)
Patient Advocate Encounter  Prior Authorization for Linezolid 600 mg tablets has been approved.    PA# [id number] Effective dates: 11/23/2021 through 12/23/2021  Patients co-pay is $0.00.     Lyndel Safe, [place] Patient Advocate Specialist [place] Patient Advocate Team Direct Number: [phone]  Fax: [phone]

## 2021-11-23 NOTE — Progress Notes (Signed)
Notified by RN that patient's blood pressure is 89/61 with a MAP of 72 Patient received 500 cc normal saline bolus His ideal body weight is 66kg and patient ideally should receive 1800 cc of fluid. Will give 1 L IV fluid bolus now and monitor respiratory status closely

## 2021-11-23 NOTE — ED Provider Notes (Signed)
[facility] Emergency Department Provider Note  ____________________________________________   Event Date/Time   First MD Initiated Contact with Patient 11/23/21 0425     (approximate)  I have reviewed the triage vital signs and the nursing notes.   HISTORY  Chief Complaint Hematuria (Blood in urine last night)    HPI [name] is a 86 y.o. male with history of CHF, CKD, CAD, hypertension, hyperlipidemia, prostate cancer who presents to the emergency department with complaints of urinary retention.  He is unable to tell me clearly when he last was able to urinate.  States that he has seen some blood in his urine today but is not on any blood thinners.  He states that he was feeling very full and having a lot of pressure so he took a small piece of metal and stuck it into his urethra to help "relieve the pressure".  States he was able to urinate some. States he was seeing hematuria prior to putting anything into his urethra.    On review of his records, it appears patient is on Plavix.    Past Medical History:  Diagnosis Date   Alcoholism ([place])    Asthma    Bipolar affective disorder ([place])    Chronic combined systolic and diastolic CHF (congestive heart failure) ([place])    a. 08/2016 Echo: EF 40-45%, mild AS, mild to mod MR, mildly dil LA/RA, mild-mod TR; b. 07/2017 Echo: EF 40-45%, mod LVH, Gr1 DD, mild to mod AS, mildly dil LA, nl RV fxn; c. 03/2019 Echo: EF 45-50%, AS (not severe). Mod dil PA.   CKD (chronic kidney disease), stage III (HCC)    COPD (chronic obstructive pulmonary disease) (HCC)    Coronary artery disease    a. 01/2004 s/p PCI and Taxus DES to dRCA (3.5 x 12 mm); b. 07/2017 Lexiscan MV: no ischemia. Sm area of apicl thinning, likely attenuation. EF 33% (GI uptake noted)-->Low risk; c. 03/2019 Inf STEMI/PCI: LM nl, LAD min irregs, D1 20ost, RI 20ost, LCX nl, RCA 90p/66m (4.0x26 Resolute Onyx DES), 62m (4.0x15 Resolute Onyx DES), 10d ISR.    Degenerative joint disease    knees and hip   Dyspnea    on exertion   Essential hypertension    Hyperlipidemia    Hypertension    controlled on meds   Ischemic cardiomyopathy    a. 08/2016 Echo: EF 40-45%; b. 07/2017 Echo: EF 40-45%; c. 03/2019 Echo: EF 45-50%.   OSA (obstructive sleep apnea)    Prostate CA (HCC)    prostate ca dx 20 yrs ago   PVD (peripheral vascular disease) (HCC)    Tobacco abuse     Patient Active Problem List   Diagnosis Date Noted   Closed anterior dislocation of shoulder (Right) 08/02/2021   Enthesopathy of hip region 07/25/2021   Anterior dislocation of right shoulder 07/04/2021   Acute pain of right shoulder 06/29/2021   Therapeutic opioid-induced constipation (OIC) 06/29/2021   Chronic use of opiate for therapeutic purpose 06/06/2021   Hyposmolality and/or hyponatremia 04/15/2021   Hypothyroidism 04/15/2021   Type 2 diabetes mellitus with diabetic chronic kidney disease ([place]) 04/15/2021   Type 2 diabetes mellitus with diabetic peripheral angiopathy without gangrene ([place]) 04/15/2021   Weakness 02/11/2021   COVID-19 02/11/2021   Morbid obesity ([place]) 01/10/2021   Calcaneal fracture (Right) 01/10/2021   PVD (peripheral vascular disease) ([place]) 76/73/4193   Chronic systolic CHF (congestive heart failure) ([place]) 01/10/2021   Hip fracture ([place]) 01/08/2021  Osteoarthritis of knee (Left) 01/06/2021   Arthropathy of knee (Left) 01/06/2021   Chronic knee pain (Left) 01/06/2021   Class 3 obesity with alveolar hypoventilation and body mass index (BMI) of 40.0 to 44.9 in adult ([place]) 12/23/2020   Hip joint effusion (Left) 12/23/2020   History of alcohol abuse 11/29/2020   Elevated rheumatoid factor 11/29/2020   Polyarthritis with positive rheumatoid factor ([place]) 11/17/2020   Elevated uric acid in blood 11/17/2020   CKD (chronic kidney disease), stage IV ([place]) 10/26/2020   Proteinuria, unspecified 10/26/2020   Elevated BUN 10/26/2020   Elevated serum creatinine  10/26/2020   Hypochloremia 10/26/2020   Hypocalcemia 10/26/2020   Hypoalbuminemia 10/26/2020   Elevated alkaline phosphatase level 10/26/2020   Elevated hemoglobin A1c 10/26/2020   Elevated random blood glucose level 10/26/2020   Anemia in stage 3b chronic kidney disease ([place]) 10/26/2020   Klippel-Feil deformity at the C5-C7 levels 10/26/2020   Carotid artery calcification, bilateral 10/26/2020   History of 2.0 cm thyroid mass (Left lobe) 10/26/2020    Class: History of   Bilateral renal cysts 10/26/2020   Lumbosacral spondylosis 10/26/2020   Osteoarthritis of facet joint of lumbar spine 10/26/2020   DDD (degenerative disc disease), lumbosacral 10/26/2020   History of total hip replacement (Right) 10/26/2020   Osteoarthritis of hip (Left) 10/26/2020   Diverticulosis of sigmoid colon 10/26/2020   Abnormal MRI, lumbar spine (10/07/2020) 10/26/2020   Lumbar central spinal stenosis with neurogenic claudication 10/26/2020   Lumbar foraminal stenosis 10/26/2020   Cyst of kidney, acquired 10/26/2020   Elevated C-reactive protein (CRP) 10/25/2020   Elevated sed rate 10/25/2020   Vitamin D deficiency 10/25/2020   Chronic anticoagulation (Plavix) 09/20/2020   Chronic hip pain (1ry area of Pain) (Left) 09/20/2020   Unilateral post-traumatic osteoarthritis of hip (Left) 09/20/2020   Chronic knee pain (2ry area of Pain) (Bilateral) 09/20/2020   Secondary osteoarthritis of knee (Bilateral) 09/20/2020   Osteoarthritis of knees (Bilateral) 09/20/2020   Chronic constipation 09/20/2020   Chronic shoulder pain (3ry area of Pain) (Bilateral) 09/20/2020   Grade 1 (3 mm) anterolisthesis of lumbar spine (L4-5) (stable) 09/20/2020   Chronic groin pain (Left) 09/20/2020   Other intervertebral disc degeneration, lumbar region 09/20/2020   Neurogenic pain 09/20/2020   Chronic pain syndrome 09/18/2020   Pharmacologic therapy 09/18/2020   Disorder of skeletal system 09/18/2020   Problems influencing  health status 09/18/2020   Lower extremity pain (Bilateral) 05/15/2018   Elevated troponin I level 07/31/2017   Hyperkalemia 05/28/2017   Syncope 05/27/2017   Meningioma ([place]) 01/05/2017   GI bleed 12/01/2016   Alcohol abuse 10/30/2016   COPD (chronic obstructive pulmonary disease) with chronic bronchitis ([place]) 10/04/2016   Obstructive sleep apnea 10/04/2016   Hyponatremia 08/31/2016   Shortness of breath 02/11/2016   Hyperlipidemia, unspecified 12/03/2015   Essential hypertension 07/17/2013   Erectile dysfunction 05/27/2013   CAD (coronary artery disease), native coronary artery 01/19/2004    Past Surgical History:  Procedure Laterality Date   CARDIAC CATHETERIZATION     CORONARY ANGIOGRAPHY N/A 04/01/2019   Procedure: CORONARY ANGIOGRAPHY;  Surgeon: Nelva Bush, MD;  Location: [facility];  Service: Cardiovascular;  Laterality: N/A;   CORONARY ANGIOPLASTY WITH STENT PLACEMENT     x2   CORONARY/GRAFT ACUTE MI REVASCULARIZATION N/A 04/01/2019   Procedure: Coronary/Graft Acute MI Revascularization;  Surgeon: Nelva Bush, MD;  Location: [facility];  Service: Cardiovascular;  Laterality: N/A;   TOTAL HIP ARTHROPLASTY     right  Prior to Admission medications   Medication Sig Start Date End Date Taking? Authorizing Provider  ADULT ASPIRIN REGIMEN 81 MG EC tablet TAKE 1 TABLET BY MOUTH DAILY 03/08/20   [name], MD  albuterol (VENTOLIN HFA) 108 (90 Base) MCG/ACT inhaler USE 2 PUFFS EVERY 4 HOURS AS NEEDED 10/14/19   Darylene Price A, FNP  atorvastatin (LIPITOR) 40 MG tablet Take 40 mg by mouth daily.    [provider]  azelastine (ASTELIN) 0.1 % nasal spray Place 1 spray into both nostrils daily as needed for allergies.    [provider]  calcium carbonate (OSCAL) 1500 (600 Ca) MG TABS tablet Take 1 tablet (1,500 mg total) by mouth 2 (two) times daily with a meal. Patient not taking: Reported on 08/26/2021 11/29/20 06/06/21   Milinda Pointer, MD  carvedilol (COREG) 6.25 MG tablet TAKE 1 TABLET BY MOUTH TWICE A DAY 08/15/21   [name], MD  clopidogrel (PLAVIX) 75 MG tablet Take 1 tablet (75 mg total) by mouth daily. 10/28/19   [name], MD  feeding supplement (ENSURE ENLIVE / ENSURE PLUS) LIQD Take 237 mLs by mouth 3 (three) times daily between meals. 01/13/21   Lorella Nimrod, MD  fluticasone (FLONASE) 50 MCG/ACT nasal spray Place 2 sprays into both nostrils daily.    [provider]  furosemide (LASIX) 40 MG tablet TAKE 1 TABLET BY MOUTH DAILY AND TAKE ANADDITIONAL TABLET IN THE AFTERNOON AS NEEDED FOR WEIGHT GAIN AND LEG EDEMA 04/21/21   [name], MD  furosemide (LASIX) 40 MG tablet Take 1 tablet (40 mg total) by mouth daily. 08/26/21 11/24/21  Furth, Cadence H, PA-C  ipratropium (ATROVENT) 0.03 % nasal spray Place 2 sprays into both nostrils daily. 05/07/20   [provider]  isosorbide mononitrate (IMDUR) 60 MG 24 hr tablet Take 1 tablet (60 mg total) by mouth daily. 08/26/21   Furth, Cadence H, PA-C  lubiprostone (AMITIZA) 8 MCG capsule Take 8 mcg by mouth 2 (two) times daily. 08/24/21   [provider]  Magnesium Oxide 500 MG CAPS Take 1 capsule (500 mg total) by mouth daily. Patient not taking: Reported on 08/26/2021 11/29/20 02/27/21  Milinda Pointer, MD  nitroGLYCERIN (NITROSTAT) 0.4 MG SL tablet Take 0.4 mg by mouth every 5 (five) minutes x 3 doses as needed for chest pain. As needed for chest pain 12/06/15   [provider]  oxyCODONE (OXY IR/ROXICODONE) 5 MG immediate release tablet Take 1 tablet (5 mg total) by mouth every 8 (eight) hours. Must last 30 days. 11/03/21 12/03/21  Milinda Pointer, MD  oxyCODONE (OXY IR/ROXICODONE) 5 MG immediate release tablet Take 1 tablet (5 mg total) by mouth every 8 (eight) hours. Must last 30 days. 12/03/21 01/02/22  Milinda Pointer, MD  oxyCODONE (OXY IR/ROXICODONE) 5 MG immediate release tablet Take 1 tablet (5 mg  total) by mouth every 8 (eight) hours. Must last 30 days. 01/02/22 02/01/22  Milinda Pointer, MD  QUEtiapine (SEROQUEL) 50 MG tablet Take 50 mg by mouth at bedtime.    [provider]  tamsulosin (FLOMAX) 0.4 MG CAPS capsule Take 0.4 mg by mouth.    [provider]  Donnal Debar 200-62.5-25 MCG/INH AEPB Inhale 1 puff into the lungs daily.  04/08/20   [provider]  Cholecalciferol (VITAMIN D3) 125 MCG (5000 UT) CAPS Take 1 capsule (5,000 Units total) by mouth daily with breakfast. Take along with calcium and magnesium. Patient not taking: Reported on 01/27/2021 11/29/20 01/28/21  Dossie Arbour,  Francisco, MD    Allergies Chlorthalidone, Ace inhibitors, Hydrochlorothiazide, Lisinopril, Losartan, Other, and Benazepril-hydrochlorothiazide  Family History  Problem Relation Age of Onset   Hypertension Mother    Hyperlipidemia Mother    Heart attack Mother    Hypertension Father    Hyperlipidemia Father    Heart attack Father    Prostate cancer Neg Hx    Bladder Cancer Neg Hx    Kidney cancer Neg Hx     Social History Social History   Tobacco Use   Smoking status: Former    Packs/day: 1.00    Years: 50.00    Pack years: 50.00    Types: Cigarettes   Smokeless tobacco: Never  Vaping Use   Vaping Use: Never used  Substance Use Topics   Alcohol use: Not Currently   Drug use: No    Review of Systems -limited as patient is a very poor historian Constitutional: No fever. Eyes: No visual changes. ENT: No sore throat. Cardiovascular: Denies chest pain. Respiratory: Denies shortness of breath. Gastrointestinal: No nausea, vomiting, diarrhea. Genitourinary: Negative for dysuria.  + For hematuria. Musculoskeletal: Negative for back pain. Skin: Negative for rash. Neurological: Negative for focal weakness or numbness.  ____________________________________________   PHYSICAL EXAM:  VITAL SIGNS: ED Triage Vitals  Enc Vitals Group     BP 11/23/21 0448 (!)  177/85     Pulse Rate 11/23/21 0429 77     Resp 11/23/21 0429 19     Temp 11/23/21 0429 99.1 F (37.3 C)     Temp Source 11/23/21 0429 Oral     SpO2 11/23/21 0426 98 %     Weight 11/23/21 0430 248 lb (112.5 kg)     Height 11/23/21 0430 5\' 7"  (1.702 m)     Head Circumference --      Peak Flow --      Pain Score 11/23/21 0430 10     Pain Loc --      Pain Edu? --      Excl. in [place]? --    CONSTITUTIONAL: Alert and oriented and responds appropriately to questions.  Elderly, appears uncomfortable HEAD: Normocephalic EYES: Conjunctivae clear, pupils appear equal, EOM appear intact ENT: normal nose; moist mucous membranes NECK: Supple, normal ROM CARD: RRR; S1 and S2 appreciated; no murmurs, no clicks, no rubs, no gallops RESP: Normal chest excursion without splinting or tachypnea; breath sounds clear and equal bilaterally; no wheezes, no rhonchi, no rales, no hypoxia or respiratory distress, speaking full sentences ABD/GI: Normal bowel sounds; soft, tender throughout the lower abdomen with abdominal distention but no fluid wave or tympany.  No guarding or rebound. GU:  Normal external genitalia, circumcised male, normal penile shaft, no blood or discharge at the urethral meatus, no testicular masses or tenderness on exam, no scrotal masses or swelling, no hernias appreciated, 2+ femoral pulses bilaterally; no perineal erythema, warmth, subcutaneous air or crepitus; no high riding testicle, normal bilateral cremasteric reflex.  Chaperone present for exam.  She has a small amount of blood at the urethral meatus but no other discharge.  He was able to urinate approximately 100 mL of rust colored urine but still expresses discomfort. BACK: The back appears normal EXT: Normal ROM in all joints; no deformity noted, no edema; no cyanosis SKIN: Normal color for age and race; warm; no rash on exposed skin NEURO: Moves all extremities equally PSYCH: The patient's mood and manner are  appropriate.  ____________________________________________   LABS (all labs ordered are listed, but only  abnormal results are displayed)  Labs Reviewed  CBC WITH DIFFERENTIAL/PLATELET - Abnormal; Notable for the following components:      Result Value   RBC 3.69 (*)    Hemoglobin 9.8 (*)    HCT 30.8 (*)    All other components within normal limits  BASIC METABOLIC PANEL - Abnormal; Notable for the following components:   Sodium 130 (*)    Creatinine, Ser 2.10 (*)    Calcium 8.4 (*)    GFR, Estimated 30 (*)    All other components within normal limits  URINE CULTURE  URINALYSIS, COMPLETE (UACMP) WITH MICROSCOPIC  PROTIME-INR   ____________________________________________  EKG   EKG Interpretation  Date/Time:  Wednesday November 23 2021 04:30:14 EST Ventricular Rate:  75 PR Interval:  208 QRS Duration: 92 QT Interval:  413 QTC Calculation: 462 R Axis:   7 Text Interpretation: Sinus rhythm Confirmed by Pryor Curia [phone]) on 11/23/2021 4:45:55 AM        ____________________________________________  RADIOLOGY Jessie Foot [place] Zirkelbach, personally viewed and evaluated these images (plain radiographs) as part of my medical decision making, as well as reviewing the written report by the radiologist.  ED MD interpretation:    Official radiology report(s): No results found.  ____________________________________________   PROCEDURES  Procedure(s) performed (including Critical Care):  Procedures    ____________________________________________   INITIAL IMPRESSION / ASSESSMENT AND PLAN / ED COURSE  As part of my medical decision making, I reviewed the following data within the [facility] notes reviewed and incorporated, Labs reviewed , Old chart reviewed, A consult was requested and obtained from this/these consultant(s) UROLOGY, and Notes from prior ED visits         Patient here with urinary retention and hematuria.  He has over 600 mL  in his bladder on bladder scan.  Was able to urinate a small amount on his own but still uncomfortable.  Urine appears to have gross hematuria but no clots.  Does have history of prostate cancer and I suspect that urinary retention is due to BPH.  Will place Foley catheter.  Will check labs for signs of worsening renal function.  Will check urinalysis for signs of infection.  ED PROGRESS  Labs appear to be at patient's baseline.  He has stable hemoglobin and renal function.  Nursing staff has attempted to place a 16 French Foley catheter without success.  Also attempted to place a 16 french coude.  He did have a small return of blood-tinged urine after placement of the coude but then it stopped draining and would not drain even after attempts to flush the catheter.  Nursing staff removed catheter and patient had frank blood coming from the urethral meatus.  On review of records from care everywhere it appears patient has had a history of prostate cancer that was treated with radiation in 1994.  He has had a penile prosthesis placed in 2010 with revision in 2014.  It appears last time he saw urology at [facility] was in 2019.  He denies that he has ever had a Foley catheter before for urinary retention.  6:30 AM  Spoke with Dr. Bernardo Heater with urology who will see patient in the emergency department given urinary retention and a staff unable to successfully place Foley catheter.  Patient updated with plan.  Appreciate urology help.  Signed out to oncoming EDP.   I reviewed all nursing notes and pertinent previous records as available.  I have reviewed and interpreted any EKGs, lab  and urine results, imaging (as available).  ____________________________________________   FINAL CLINICAL IMPRESSION(S) / ED DIAGNOSES  Final diagnoses:  Urinary retention  Gross hematuria     ED Discharge Orders     None       *Please note:  MICAHEL OMLOR was evaluated in Emergency Department on 11/23/2021 for the  symptoms described in the history of present illness. He was evaluated in the context of the global COVID-19 pandemic, which necessitated consideration that the patient might be at risk for infection with the SARS-CoV-2 virus that causes COVID-19. Institutional protocols and algorithms that pertain to the evaluation of patients at risk for COVID-19 are in a state of rapid change based on information released by regulatory bodies including the CDC and federal and state organizations. These policies and algorithms were followed during the patient's care in the ED.  Some ED evaluations and interventions may be delayed as a result of limited staffing during and the pandemic.*   Note:  This document was prepared using Dragon voice recognition software and may include unintentional dictation errors.    Chaquana Nichols, Delice Bison, DO 11/23/21 [phone]

## 2021-11-23 NOTE — ED Notes (Signed)
Bladder scan says over 600 ml

## 2021-11-23 NOTE — ED Provider Notes (Signed)
Patient was received in signout from Dr. Leonides Schanz pending urology consultation for Foley catheter placement.  They were able to obtain Foley catheter and patient becoming increasingly nauseated and vomiting several times.  Also becoming increasingly tachycardic.  Urinalysis does show many bacteria findings consistent with cystitis.  Patient complaining of back pain therefore CT renal ordered which does not show any sign of stone.  Does have a history of ampicillin resistant Enterococcus urinary tract infection.  We will repeat culture.  As the patient is ill-appearing frail with multiple comorbidities I do believe that observation is appropriate for IV antibiotics as he is having vomiting and not tolerating p.o.  Will discuss with hospitalist.   Merlyn Lot, MD 11/23/21 1130

## 2021-11-24 ENCOUNTER — Encounter: Payer: Self-pay | Admitting: Internal Medicine

## 2021-11-24 ENCOUNTER — Other Ambulatory Visit: Payer: Self-pay

## 2021-11-24 DIAGNOSIS — L8990 Pressure ulcer of unspecified site, unspecified stage: Secondary | ICD-10-CM | POA: Insufficient documentation

## 2021-11-24 DIAGNOSIS — B952 Enterococcus as the cause of diseases classified elsewhere: Secondary | ICD-10-CM

## 2021-11-24 LAB — BLOOD CULTURE ID PANEL (REFLEXED) - BCID2

## 2021-11-24 LAB — BASIC METABOLIC PANEL
Anion gap: 12 (ref 5–15)
BUN: 24 mg/dL — ABNORMAL HIGH (ref 8–23)
CO2: 22 mmol/L (ref 22–32)
Calcium: 8.2 mg/dL — ABNORMAL LOW (ref 8.9–10.3)
Chloride: 99 mmol/L (ref 98–111)
Creatinine, Ser: 2.35 mg/dL — ABNORMAL HIGH (ref 0.61–1.24)
GFR, Estimated: 27 mL/min — ABNORMAL LOW (ref >60)
Glucose, Bld: 82 mg/dL (ref 70–99)
Potassium: 4.4 mmol/L (ref 3.5–5.1)
Sodium: 133 mmol/L — ABNORMAL LOW (ref 135–145)

## 2021-11-24 LAB — CBC
HCT: 29.5 % — ABNORMAL LOW (ref 39.0–52.0)
Hemoglobin: 9.5 g/dL — ABNORMAL LOW (ref 13.0–17.0)
MCH: 26.5 pg (ref 26.0–34.0)
MCHC: 32.2 g/dL (ref 30.0–36.0)
MCV: 82.2 fL (ref 80.0–100.0)
Platelets: 249 10*3/uL (ref 150–400)
RBC: 3.59 MIL/uL — ABNORMAL LOW (ref 4.22–5.81)
RDW: 15.5 % (ref 11.5–15.5)
WBC: 21.7 10*3/uL — ABNORMAL HIGH (ref 4.0–10.5)
nRBC: 0 % (ref 0.0–0.2)

## 2021-11-24 LAB — MAGNESIUM: Magnesium: 1.5 mg/dL — ABNORMAL LOW (ref 1.7–2.4)

## 2021-11-24 LAB — CORTISOL-AM, BLOOD: Cortisol - AM: 37.2 ug/dL — ABNORMAL HIGH (ref 6.7–22.6)

## 2021-11-24 LAB — LACTIC ACID, PLASMA: Lactic Acid, Venous: 2.7 mmol/L (ref 0.5–1.9)

## 2021-11-24 LAB — PROTIME-INR
INR: 1.2 (ref 0.8–1.2)
Prothrombin Time: 15.7 seconds — ABNORMAL HIGH (ref 11.4–15.2)

## 2021-11-24 LAB — PROCALCITONIN: Procalcitonin: >150 ng/mL

## 2021-11-24 MED ORDER — ENOXAPARIN SODIUM 40 MG/0.4ML IJ SOSY
40.0000 mg | PREFILLED_SYRINGE | INTRAMUSCULAR | Status: DC
Start: 2021-11-24 — End: 2021-11-26
  Administered 2021-11-24 – 2021-11-25 (×2): 40 mg via SUBCUTANEOUS
  Filled 2021-11-24 (×2): qty 0.4

## 2021-11-24 MED ORDER — MORPHINE SULFATE (PF) 4 MG/ML IV SOLN
4.0000 mg | INTRAVENOUS | Status: DC | PRN
  Administered 2021-11-24 (×2): 4 mg via INTRAVENOUS
  Filled 2021-11-24 (×2): qty 1

## 2021-11-24 MED ORDER — CARVEDILOL 6.25 MG PO TABS
6.2500 mg | ORAL_TABLET | Freq: Two times a day (BID) | ORAL | Status: DC
  Administered 2021-11-24 – 2021-12-05 (×23): 6.25 mg via ORAL
  Filled 2021-11-24 (×22): qty 1

## 2021-11-24 MED ORDER — CHLORHEXIDINE GLUCONATE CLOTH 2 % EX PADS
6.0000 | MEDICATED_PAD | Freq: Every day | CUTANEOUS | Status: DC
  Administered 2021-11-24 – 2021-12-05 (×11): 6 via TOPICAL

## 2021-11-24 MED ORDER — VANCOMYCIN HCL 1250 MG/250ML IV SOLN
1250.0000 mg | INTRAVENOUS | Status: DC
  Administered 2021-11-25 – 2021-11-27 (×2): 1250 mg via INTRAVENOUS
  Filled 2021-11-24 (×2): qty 250

## 2021-11-24 MED ORDER — MAGNESIUM SULFATE 2 GM/50ML IV SOLN
2.0000 g | Freq: Once | INTRAVENOUS | Status: AC
  Administered 2021-11-24: 2 g via INTRAVENOUS
  Filled 2021-11-24: qty 50

## 2021-11-24 NOTE — Consult Note (Addendum)
NAME: [name]  DOB: December 29, 1934  MRN: [id number]  Date/Time: 11/24/2021 2:01 PM  REQUESTING PROVIDER: Dr.Dahal Subjective:  REASON FOR CONSULT: enterococcus baceremia ?pt is a limited historian-  Chart reviewed [name] is a 86 y.o. with a history of HTN, HLD, prostate ca, HLD, ischemic cardiomyopathy, CKD, Rt THA, left ORIF Presented to the ED on 11/23/21 with difficulty passing urine and hematuria. In the ED vitals 177/85, HR 77, Temp 99.1, sats 98% Bladder scan revealed 600cc . Nursing staff attempted to place foley and they couldn't, they tried coude and it was not working after a few drops of blood tinged urine. When they removed it frank blood was noted at the urinary meatus. He then vomited several times, and was becoming tachycardic. UA was > 50 wbc  Dr.Stoioff urologist saw the patient and placed a 12 F coude catheter. Labs revealed Hb 9.8, cr 2.10 His temp went up to 100.9 and cultures were sent  He was given ceftriaxone , and then vanco As blood culture came back positive for enterococcus I am seeing the patient  Pt had ampicillin  resistant enterococcus faecium in feb 2022  Pt has a h/o prostate cancer treated with radiation in 1994. He has had a penile prosthesis placed in 2010 and revised in 2014. Followed by urology at [facility] and last them in 2019.  Pt says he lives on his won Not very mobile  Denies any cough, sob, fever,. Deneis any urinary retention before   Past Medical History:  Diagnosis Date   Alcoholism ([place])    Asthma    Bipolar affective disorder ([place])    Chronic combined systolic and diastolic CHF (congestive heart failure) ([place])    a. 08/2016 Echo: EF 40-45%, mild AS, mild to mod MR, mildly dil LA/RA, mild-mod TR; b. 07/2017 Echo: EF 40-45%, mod LVH, Gr1 DD, mild to mod AS, mildly dil LA, nl RV fxn; c. 03/2019 Echo: EF 45-50%, AS (not severe). Mod dil PA.   CKD (chronic kidney disease), stage III (HCC)    COPD (chronic obstructive pulmonary disease)  (HCC)    Coronary artery disease    a. 01/2004 s/p PCI and Taxus DES to dRCA (3.5 x 12 mm); b. 07/2017 Lexiscan MV: no ischemia. Sm area of apicl thinning, likely attenuation. EF 33% (GI uptake noted)-->Low risk; c. 03/2019 Inf STEMI/PCI: LM nl, LAD min irregs, D1 20ost, RI 20ost, LCX nl, RCA 90p/4m (4.0x26 Resolute Onyx DES), 27m (4.0x15 Resolute Onyx DES), 10d ISR.   Degenerative joint disease    knees and hip   Dyspnea    on exertion   Essential hypertension    Hyperlipidemia    Hypertension    controlled on meds   Ischemic cardiomyopathy    a. 08/2016 Echo: EF 40-45%; b. 07/2017 Echo: EF 40-45%; c. 03/2019 Echo: EF 45-50%.   OSA (obstructive sleep apnea)    Prostate CA (HCC)    prostate ca dx 20 yrs ago   PVD (peripheral vascular disease) ([place])    Tobacco abuse     Past Surgical History:  Procedure Laterality Date   CARDIAC CATHETERIZATION     CORONARY ANGIOGRAPHY N/A 04/01/2019   Procedure: CORONARY ANGIOGRAPHY;  Surgeon: Nelva Bush, MD;  Location: [facility];  Service: Cardiovascular;  Laterality: N/A;   CORONARY ANGIOPLASTY WITH STENT PLACEMENT     x2   CORONARY/GRAFT ACUTE MI REVASCULARIZATION N/A 04/01/2019   Procedure: Coronary/Graft Acute MI Revascularization;  Surgeon: Nelva Bush, MD;  Location: [facility]  INVASIVE CV LAB;  Service: Cardiovascular;  Laterality: N/A;   TOTAL HIP ARTHROPLASTY     right    Social History   Socioeconomic History   Marital status: Legally Separated    Spouse name: Not on file   Number of children: Not on file   Years of education: Not on file   Highest education level: Not on file  Occupational History   Occupation: retired  Tobacco Use   Smoking status: Former    Packs/day: 1.00    Years: 50.00    Pack years: 50.00    Types: Cigarettes   Smokeless tobacco: Never  Vaping Use   Vaping Use: Never used  Substance and Sexual Activity   Alcohol use: Not Currently   Drug use: No   Sexual activity: Never  Other Topics  Concern   Not on file  Social History Narrative   Not on file   Social Determinants of Health   Financial Resource Strain: Not on file  Food Insecurity: Not on file  Transportation Needs: Not on file  Physical Activity: Not on file  Stress: Not on file  Social Connections: Not on file  Intimate Partner Violence: Not on file    Family History  Problem Relation Age of Onset   Hypertension Mother    Hyperlipidemia Mother    Heart attack Mother    Hypertension Father    Hyperlipidemia Father    Heart attack Father    Prostate cancer Neg Hx    Bladder Cancer Neg Hx    Kidney cancer Neg Hx    Allergies  Allergen Reactions   Chlorthalidone Other (See Comments)    Hyponatremia   Ace Inhibitors Cough   Hydrochlorothiazide    Lisinopril Other (See Comments)   Losartan    Other Other (See Comments)    "ANY BLOOD PRESSURE MEDICATIONS THAT I'VE TRIED" - PT. DOES NOT REMEMBER WHICH ONES "ANY BLOOD PRESSURE MEDICATIONS THAT I'VE TRIED"- PT. DOES NOT REMEMBER WHICH ONES- CONSTIPATION   Benazepril-Hydrochlorothiazide Other (See Comments)    Constipation   I? Current Facility-Administered Medications  Medication Dose Route Frequency Provider Last Rate Last Admin   acetaminophen (TYLENOL) tablet 650 mg  650 mg Oral Q6H PRN Agbata, Tochukwu, MD       Or   acetaminophen (TYLENOL) suppository 650 mg  650 mg Rectal Q6H PRN Agbata, Tochukwu, MD       albuterol (PROVENTIL) (2.5 MG/3ML) 0.083% nebulizer solution 3 mL  3 mL Inhalation Q4H PRN Agbata, Tochukwu, MD       aspirin EC tablet 81 mg  81 mg Oral Daily Agbata, Tochukwu, MD   81 mg at 11/24/21 1007   atorvastatin (LIPITOR) tablet 40 mg  40 mg Oral QPM Agbata, Tochukwu, MD   40 mg at 11/23/21 1740   carvedilol (COREG) tablet 6.25 mg  6.25 mg Oral BID Dahal, Marlowe Aschoff, MD       cefTRIAXone (ROCEPHIN) 2 g in sodium chloride 0.9 % 100 mL IVPB  2 g Intravenous Q24H Agbata, Tochukwu, MD 200 mL/hr at 11/24/21 0512 2 g at 11/24/21 2878    Chlorhexidine Gluconate Cloth 2 % PADS 6 each  6 each Topical Daily Agbata, Tochukwu, MD       clopidogrel (PLAVIX) tablet 75 mg  75 mg Oral Daily Agbata, Tochukwu, MD   75 mg at 11/24/21 1007   enoxaparin (LOVENOX) injection 40 mg  40 mg Subcutaneous Q24H Dahal, Marlowe Aschoff, MD       feeding supplement (ENSURE ENLIVE /  ENSURE PLUS) liquid 237 mL  237 mL Oral TID BM Agbata, Tochukwu, MD   237 mL at 11/24/21 1235   fluticasone (FLONASE) 50 MCG/ACT nasal spray 2 spray  2 spray Each Nare Daily PRN Agbata, Tochukwu, MD       fluticasone furoate-vilanterol (BREO ELLIPTA) 200-25 MCG/ACT 1 puff  1 puff Inhalation Daily Nazari, Walid A, RPH   1 puff at 11/24/21 1008   And   umeclidinium bromide (INCRUSE ELLIPTA) 62.5 MCG/ACT 1 puff  1 puff Inhalation Daily Nazari, Walid A, RPH   1 puff at 11/24/21 1008   ipratropium (ATROVENT) 0.03 % nasal spray 2 spray  2 spray Each Nare Daily Agbata, Tochukwu, MD   2 spray at 11/24/21 1113   lubiprostone (AMITIZA) capsule 8 mcg  8 mcg Oral BID Agbata, Tochukwu, MD   8 mcg at 11/24/21 1117   magnesium sulfate IVPB 2 g 50 mL  2 g Intravenous Once Terrilee Croak, MD 50 mL/hr at 11/24/21 1318 2 g at 11/24/21 1318   morphine 4 MG/ML injection 4 mg  4 mg Intravenous Q3H PRN Sharion Settler, NP   4 mg at 11/24/21 1005   nitroGLYCERIN (NITROSTAT) SL tablet 0.4 mg  0.4 mg Sublingual Q5 min PRN Agbata, Tochukwu, MD       ondansetron (ZOFRAN) tablet 4 mg  4 mg Oral Q6H PRN Agbata, Tochukwu, MD       Or   ondansetron (ZOFRAN) injection 4 mg  4 mg Intravenous Q6H PRN Agbata, Tochukwu, MD   4 mg at 11/23/21 1740   oxyCODONE (Oxy IR/ROXICODONE) immediate release tablet 5 mg  5 mg Oral Q8H Agbata, Tochukwu, MD   5 mg at 11/24/21 1235   QUEtiapine (SEROQUEL) tablet 50 mg  50 mg Oral QHS Agbata, Tochukwu, MD       tamsulosin (FLOMAX) capsule 0.4 mg  0.4 mg Oral Daily Agbata, Tochukwu, MD   0.4 mg at 11/24/21 1007   [START ON 11/25/2021] vancomycin (VANCOREADY) IVPB 1250 mg/250 mL  1,250 mg  Intravenous Q48H Zeigler, Dustin G, RPH         Abtx:  Anti-infectives (From admission, onward)    Start     Dose/Rate Route Frequency Ordered Stop   11/25/21 1000  vancomycin (VANCOREADY) IVPB 1250 mg/250 mL        1,250 mg 166.7 mL/hr over 90 Minutes Intravenous Every 48 hours 11/24/21 1216     11/25/21 0100  vancomycin (VANCOCIN) IVPB 1000 mg/200 mL premix  Status:  Discontinued        1,000 mg 200 mL/hr over 60 Minutes Intravenous Every 36 hours 11/23/21 1227 11/24/21 1216   11/24/21 0600  cefTRIAXone (ROCEPHIN) 2 g in sodium chloride 0.9 % 100 mL IVPB        2 g 200 mL/hr over 30 Minutes Intravenous Every 24 hours 11/23/21 1212 12/01/21 0559   11/23/21 1330  vancomycin (VANCOREADY) IVPB 1500 mg/300 mL       See Hyperspace for full Linked Orders Report.   1,500 mg 150 mL/hr over 120 Minutes Intravenous  Once 11/23/21 1219 11/23/21 1653   11/23/21 1230  vancomycin (VANCOCIN) IVPB 1000 mg/200 mL premix       See Hyperspace for full Linked Orders Report.   1,000 mg 200 mL/hr over 60 Minutes Intravenous  Once 11/23/21 1219 11/23/21 1438   11/23/21 1215  cefTRIAXone (ROCEPHIN) 2 g in sodium chloride 0.9 % 100 mL IVPB  Status:  Discontinued  2 g 200 mL/hr over 30 Minutes Intravenous Every 24 hours 11/23/21 1211 11/23/21 1212   11/23/21 1015  fosfomycin (MONUROL) packet 3 g  Status:  Discontinued        3 g Oral  Once 11/23/21 1009 11/23/21 1009   11/23/21 0930  cefTRIAXone (ROCEPHIN) 1 g in sodium chloride 0.9 % 100 mL IVPB        1 g 200 mL/hr over 30 Minutes Intravenous  Once 11/23/21 0917 11/23/21 1127   11/23/21 0900  cephALEXin (KEFLEX) capsule 500 mg  Status:  Discontinued        500 mg Oral  Once 11/23/21 4128 11/23/21 0917       REVIEW OF SYSTEMS:  Pt is in discomfort and does not want to talk- he says other than the difficulty in passing urine he does not have other symptoms Const: negative fever, negative chills, negative weight loss Eyes: negative diplopia or  visual changes, negative eye pain ENT: negative coryza, negative sore throat Resp: negative cough, hemoptysis, dyspnea Cards: negative for chest pain, palpitations, lower extremity edema GU: urinary retention hematuria GI: + abdominal pain,  no diarrhea, bleeding, constipation Skin: negative for rash and pruritus Heme: negative for easy bruising and gum/nose bleeding NO:MVEHMCN weakness- does not walk much Neurolo:negative for headaches, dizziness, vertigo, memory problems  Psych: negative for feelings of anxiety, depression  Endocrine: negative for thyroid, diabetes Allergy/Immunology-as above Objective:  VITALS:  BP (!) 110/59 (BP Location: Left Arm)   Pulse 91   Temp 98.9 F (37.2 C) (Oral)   Resp 19   Ht 5\' 7"  (1.702 m)   Wt 114.5 kg   SpO2 95%   BMI 39.54 kg/m  PHYSICAL EXAM:  General: pt in bed with eyes closed, moaning- on calling his name and asking him to open his eyes he does- He responds to questions appropriately but does not want to be bothered Head: Normocephalic, without obvious abnormality, atraumatic. Eyes: b/l congestion of the conjunctiva Corneal opacity left eye  ENT Nares normal. No drainage or sinus tenderness. Lips, mucosa, and tongue normal. No Thrush  Neck:  symmetrical, no adenopathy, thyroid: non tender no carotid bruit and no JVD. Back: did not examine Lungs: b/l air entry Heart:s1s2 Abdomen: Soft, obese Foley cath- blood tinged urine Extremities: atraumatic, no cyanosis. No edema. No clubbing Skin: No rashes or lesions. Or bruising Lymph: Cervical, supraclavicular normal. Neurologic: cannot assess in detail Moves limbs Pertinent Labs Lab Results CBC    Component Value Date/Time   WBC 21.7 (H) 11/24/2021 0509   RBC 3.59 (L) 11/24/2021 0509   HGB 9.5 (L) 11/24/2021 0509   HGB 11.5 (L) 12/05/2019 1528   HCT 29.5 (L) 11/24/2021 0509   HCT 34.5 (L) 12/05/2019 1528   PLT 249 11/24/2021 0509   PLT 303 09/20/2020 1500   MCV 82.2  11/24/2021 0509   MCV 83 12/05/2019 1528   MCV 85 04/03/2015 1216   MCH 26.5 11/24/2021 0509   MCHC 32.2 11/24/2021 0509   RDW 15.5 11/24/2021 0509   RDW 13.9 12/05/2019 1528   RDW 15.1 (H) 04/03/2015 1216   LYMPHSABS 1.5 11/23/2021 0518   LYMPHSABS 1.7 05/31/2014 0416   MONOABS 0.8 11/23/2021 0518   MONOABS 0.5 05/31/2014 0416   EOSABS 0.1 11/23/2021 0518   EOSABS 0.2 05/31/2014 0416   BASOSABS 0.0 11/23/2021 0518   BASOSABS 0.0 05/31/2014 0416    CMP Latest Ref Rng & Units 11/24/2021 11/23/2021 09/02/2021  Glucose 70 - 99 mg/dL 82 87  94  BUN 8 - 23 mg/dL 24(H) 20 15  Creatinine 0.61 - 1.24 mg/dL 2.35(H) 2.10(H) 2.18(H)  Sodium 135 - 145 mmol/L 133(L) 130(L) 137  Potassium 3.5 - 5.1 mmol/L 4.4 4.7 4.0  Chloride 98 - 111 mmol/L 99 101 100  CO2 22 - 32 mmol/L 22 22 20   Calcium 8.9 - 10.3 mg/dL 8.2(L) 8.4(L) 8.3(L)  Total Protein 6.5 - 8.1 g/dL - - -  Total Bilirubin 0.3 - 1.2 mg/dL - - -  Alkaline Phos 38 - 126 U/L - - -  AST 15 - 41 U/L - - -  ALT 0 - 44 U/L - - -      Microbiology: Recent Results (from the past 240 hour(s))  Urine Culture     Status: None (Preliminary result)   Collection Time: 11/23/21  7:41 AM   Specimen: Urine, Catheterized  Result Value Ref Range Status   Specimen Description   Final    URINE, CATHETERIZED Performed at [facility], [address] [place], [place] [address]    Special Requests   Final    NONE Performed at [facility], [address], [place], [place] [address]    Culture   Final    CULTURE REINCUBATED FOR BETTER GROWTH Performed at [facility], [place] [address] [place], [place] [address]    Report Status PENDING  Incomplete  Blood culture (routine x 2)     Status: None (Preliminary result)   Collection Time: 11/23/21  9:55 AM   Specimen: BLOOD  Result Value Ref Range Status   Specimen Description BLOOD RIGHT FA  Final   Special Requests   Final    BOTTLES DRAWN AEROBIC AND ANAEROBIC Blood  Culture adequate volume   Culture  Setup Time   Final    Organism ID to follow GRAM POSITIVE COCCI IN BOTH AEROBIC AND ANAEROBIC BOTTLES CRITICAL RESULT CALLED TO, READ BACK BY AND VERIFIED WITH: Lu Duffel 11/24/21 0857 MW Performed at [facility], [address] [place], [place] [address]    Culture GRAM POSITIVE COCCI  Final   Report Status PENDING  Incomplete  Blood Culture ID Panel (Reflexed)     Status: Abnormal   Collection Time: 11/23/21  9:55 AM  Result Value Ref Range Status   Enterococcus faecalis NOT DETECTED NOT DETECTED Final   Enterococcus Faecium DETECTED (A) NOT DETECTED Final    Comment: RESULT CALLED TO, READ BACK BY AND VERIFIED WITH: [facility] 11/24/21 0857 MW    Listeria monocytogenes NOT DETECTED NOT DETECTED Final   Staphylococcus species NOT DETECTED NOT DETECTED Final   Staphylococcus aureus (BCID) NOT DETECTED NOT DETECTED Final   Staphylococcus epidermidis NOT DETECTED NOT DETECTED Final   Staphylococcus lugdunensis NOT DETECTED NOT DETECTED Final   Streptococcus species NOT DETECTED NOT DETECTED Final   Streptococcus agalactiae NOT DETECTED NOT DETECTED Final   Streptococcus pneumoniae NOT DETECTED NOT DETECTED Final   Streptococcus pyogenes NOT DETECTED NOT DETECTED Final   A.calcoaceticus-baumannii NOT DETECTED NOT DETECTED Final   Bacteroides fragilis NOT DETECTED NOT DETECTED Final   Enterobacterales NOT DETECTED NOT DETECTED Final   Enterobacter cloacae complex NOT DETECTED NOT DETECTED Final   Escherichia coli NOT DETECTED NOT DETECTED Final   Klebsiella aerogenes NOT DETECTED NOT DETECTED Final   Klebsiella oxytoca NOT DETECTED NOT DETECTED Final   Klebsiella pneumoniae NOT DETECTED NOT DETECTED Final   Proteus species NOT DETECTED NOT DETECTED Final   Salmonella species NOT DETECTED NOT  DETECTED Final   Serratia marcescens NOT DETECTED NOT DETECTED Final   Haemophilus influenzae NOT DETECTED NOT DETECTED Final   Neisseria  meningitidis NOT DETECTED NOT DETECTED Final   Pseudomonas aeruginosa NOT DETECTED NOT DETECTED Final   Stenotrophomonas maltophilia NOT DETECTED NOT DETECTED Final   Candida albicans NOT DETECTED NOT DETECTED Final   Candida auris NOT DETECTED NOT DETECTED Final   Candida glabrata NOT DETECTED NOT DETECTED Final   Candida krusei NOT DETECTED NOT DETECTED Final   Candida parapsilosis NOT DETECTED NOT DETECTED Final   Candida tropicalis NOT DETECTED NOT DETECTED Final   Cryptococcus neoformans/gattii NOT DETECTED NOT DETECTED Final   Vancomycin resistance NOT DETECTED NOT DETECTED Final    Comment: Performed at [facility], [address] [place], [place] [address]  Resp Panel by RT-PCR (Flu A&B, Covid) Nasopharyngeal Swab     Status: None   Collection Time: 11/23/21 11:24 AM   Specimen: Nasopharyngeal Swab; Nasopharyngeal(NP) swabs in vial transport medium  Result Value Ref Range Status   SARS Coronavirus 2 by RT PCR NEGATIVE NEGATIVE Final    Comment: (NOTE) SARS-CoV-2 target nucleic acids are NOT DETECTED.  The SARS-CoV-2 RNA is generally detectable in upper respiratory specimens during the acute phase of infection. The lowest concentration of SARS-CoV-2 viral copies this assay can detect is 138 copies/mL. A negative result does not preclude SARS-Cov-2 infection and should not be used as the sole basis for treatment or other patient management decisions. A negative result may occur with  improper specimen collection/handling, submission of specimen other than nasopharyngeal swab, presence of viral mutation(s) within the areas targeted by this assay, and inadequate number of viral copies(<138 copies/mL). A negative result must be combined with clinical observations, patient history, and epidemiological information. The expected result is Negative.  Fact Sheet for Patients:  EntrepreneurPulse.com.au  Fact Sheet for Healthcare Providers:   IncredibleEmployment.be  This test is no t yet approved or cleared by the [place] FDA and  has been authorized for detection and/or diagnosis of SARS-CoV-2 by FDA under an Emergency Use Authorization (EUA). This EUA will remain  in effect (meaning this test can be used) for the duration of the COVID-19 declaration under Section 564(b)(1) of the Act, 21 U.S.C.section 360bbb-3(b)(1), unless the authorization is terminated  or revoked sooner.       Influenza A by PCR NEGATIVE NEGATIVE Final   Influenza B by PCR NEGATIVE NEGATIVE Final    Comment: (NOTE) The Xpert Xpress SARS-CoV-2/FLU/RSV plus assay is intended as an aid in the diagnosis of influenza from Nasopharyngeal swab specimens and should not be used as a sole basis for treatment. Nasal washings and aspirates are unacceptable for Xpert Xpress SARS-CoV-2/FLU/RSV testing.  Fact Sheet for Patients: EntrepreneurPulse.com.au  Fact Sheet for Healthcare Providers: IncredibleEmployment.be  This test is not yet approved or cleared by the [place] FDA and has been authorized for detection and/or diagnosis of SARS-CoV-2 by FDA under an Emergency Use Authorization (EUA). This EUA will remain in effect (meaning this test can be used) for the duration of the COVID-19 declaration under Section 564(b)(1) of the Act, 21 U.S.C. section 360bbb-3(b)(1), unless the authorization is terminated or revoked.  Performed at [facility], [address] [place], [place] [address]   Blood culture (routine x 2)     Status: None (Preliminary result)   Collection Time: 11/23/21  1:55 PM   Specimen: BLOOD  Result Value Ref Range Status   Specimen Description BLOOD BLOOD LEFT HAND  Final   Special Requests   Final    BOTTLES DRAWN AEROBIC AND ANAEROBIC Blood Culture adequate volume   Culture   Final    NO GROWTH < 24 HOURS Performed at [facility], [address] [place], [place] [address]    Report Status PENDING  Incomplete    IMAGING RESULTS: Cardiolmegaly with interstitial edema  I have personally reviewed the films ?CT abdomen- penile prosthesis Rt THA  Impression/Recommendation ? ?Acute urinary retention- traumatic catheterization  Enterococcus bacteremia- likely translocation from the bladder due to traumatic cath Continue vanco as previously the bacteria was cultured in the urine in feb 2022 and was resistant to ampicillin. Await susceptibility  Leucocytosis following catheterization - could be a reactive response and infection  Anemia    Pat has penile prosthesis- watch closely to r/o any hardware infection  H/o ca prostate - s/p radiation in 1994  CKD ? __HTN  Ischemic cardiomyopathy- on carvedilol _____CAD- on plavix  RT THA ____________________________________________ Discussed with care team Note:  This document was prepared using Dragon voice recognition software and may include unintentional dictation errors.

## 2021-11-24 NOTE — Progress Notes (Signed)
PROGRESS NOTE  [name]  DOB: 1935-06-04  PCP: Kathyrn Lass YJE:[id number]  DOA: 11/23/2021  LOS: 1 day  Hospital Day: 2  Chief Complaint  Patient presents with   Hematuria    Blood in urine last night    Brief narrative: [name] is a 86 y.o. male with PMH significant for HTN, HLD, PAD, CAD s/p stents, chronic combined systolic and diastolic CHF COPD, chronic smoking, chronic alcoholism, sleep apnea bipolar disorder, degenerative joint disease, history of prostate cancer treated with radiation therapy 1994, status post penile prosthesis in 2010 with revision in 2014.  Patient presented to the ED on 12/7 with complaint difficulty voiding for 24 hours. On 12/6, he felt difficulty voiding despite feeling that his bladder was full.  Next morning on 12/7, he apparently tried to put in a metal object through his urethra to relieve any 'obstruction'. He was able to urinate but it was grossly bloody.  He had severe suprapubic pain 10 out of 10 and hence was brought to the ED by EMS.  In the ED, bladder scan showed more than 669 mL of urine.  Urology had to be called for Foley catheter placement that drained purulent urine. Vital signs with temperature nine 9.1, initial heart rate 70s which later jumped to 100s Initial labs with sodium 130, creatinine 2.1, lactic acid elevated 2.5, WBC 8.7, hemoglobin 9.8 with platelet 319, INR 1.4 Urinalysis with hazy amber color urine with large amount of hemoglobin, large amount of leukocytes and many bacteria. Blood culture urine culture was sent. CT renal study was obtained which did not show any acute abnormality in the abdomen pelvis, specifically no hydronephrosis. Admitted to hospitalist service. Urology consultation was obtained.  Subjective: Patient was seen and examined this morning.  Pleasant elderly African-American male.  Lying down in bed.  Continues to have abdominal pain and tenderness. Foley catheter with clear urine. Chart reviewed.   Had a temperature 100.9 earlier this morning, breathing on room air, blood pressure 124/54, heart rate 80s Labs this morning with sodium 133, creatinine further worse at 2.35, magnesium low at 1.5, lactic acid up to 2.7, WC count up to 21.7, hemoglobin slightly lower at 9.5 Blood culture obtained on admission is growing Enterococcus faecium  Assessment/Plan: Sepsis secondary to UTI Enterococcus faecium UTI -Presented with abdominal pain, urinary retention.  Purulent urine was drained in ED. -Noted to have low-grade temperature, leukocytosis, lactic acidosis -Urinalysis with large amount of leukocytes and many bacteria.  -Urine culture obtained on admission is growing Enterococcus faecium.  Pending sensitivity. -Currently on IV Rocephin and IV vancomycin. -ID consulted. Recent Labs  Lab 11/23/21 0518 11/23/21 0955 11/23/21 1355 11/24/21 0509  WBC 8.7  --   --  21.7*  LATICACIDVEN  --  2.5* 2.8* 2.7*  PROCALCITON  --   --   --  >150.00   Acute urinary retention Hematuria BPH  -Patient apparently had UTI followed precipitating acute retention.  He apparently tried to relieve the 'obstruction' by inserting a metal rod through the urethra.  After that he started having hematuria and severe suprapubic pain.  He was brought to the ED and seen by urologist.  Foley catheter was placed.  Currently has clear urine. -Currently on Flomax. -CT renal stone on admission did not show any acute intra-abdominal or pelvic pathology.  -Currently on IV morphine for pain.  Wean down IV opiates.  Coronary artery disease -Continue aspirin, Plavix and statins.    h/o chronic combined systolic and diastolic  dysfunction CHF -Home meds include Coreg, Lasix, nitrate.  Resume Coreg.  Continue nitrate.  Keep Lasix on hold. -Last echo from 2020 with EF 45 to 50%, severely increased LV wall thickness.   COPD -Home meds include Trelegy, Atrovent  Bipolar disorder -On Seroquel  Mobility: Needs PT  eval Living condition: Lives at home Goals of care:   Code Status: DNR  Nutritional status: Body mass index is 39.54 kg/m.      Diet:  Diet Order             Diet full liquid Room service appropriate? Yes; Fluid consistency: Thin  Diet effective now                  DVT prophylaxis:  enoxaparin (LOVENOX) injection 40 mg Start: 11/24/21 2200 SCDs Start: 11/23/21 1209   Antimicrobials: IV Rocephin and vancomycin Fluid: None Consultants: Urologist Family Communication: None at bedside  Status is: Inpatient  Continue in-hospital care because: Needs IV antibiotics, further ID work-up Level of care: Telemetry Medical   Dispo: The patient is from: Home              Anticipated d/c is to: Pending PT eval              Patient currently is not medically stable to d/c.   Difficult to place patient No     Infusions:   cefTRIAXone (ROCEPHIN)  IV 2 g (11/24/21 0512)   magnesium sulfate bolus IVPB 2 g (11/24/21 1318)   [START ON 11/25/2021] vancomycin      Scheduled Meds:  aspirin EC  81 mg Oral Daily   atorvastatin  40 mg Oral QPM   carvedilol  6.25 mg Oral BID   Chlorhexidine Gluconate Cloth  6 each Topical Daily   clopidogrel  75 mg Oral Daily   enoxaparin (LOVENOX) injection  40 mg Subcutaneous Q24H   feeding supplement  237 mL Oral TID BM   fluticasone furoate-vilanterol  1 puff Inhalation Daily   And   umeclidinium bromide  1 puff Inhalation Daily   ipratropium  2 spray Each Nare Daily   lubiprostone  8 mcg Oral BID   oxyCODONE  5 mg Oral Q8H   QUEtiapine  50 mg Oral QHS   tamsulosin  0.4 mg Oral Daily    PRN meds: acetaminophen **OR** acetaminophen, albuterol, fluticasone, morphine injection, nitroGLYCERIN, ondansetron **OR** ondansetron (ZOFRAN) IV   Antimicrobials: Anti-infectives (From admission, onward)    Start     Dose/Rate Route Frequency Ordered Stop   11/25/21 1000  vancomycin (VANCOREADY) IVPB 1250 mg/250 mL        1,250 mg 166.7 mL/hr over  90 Minutes Intravenous Every 48 hours 11/24/21 1216     11/25/21 0100  vancomycin (VANCOCIN) IVPB 1000 mg/200 mL premix  Status:  Discontinued        1,000 mg 200 mL/hr over 60 Minutes Intravenous Every 36 hours 11/23/21 1227 11/24/21 1216   11/24/21 0600  cefTRIAXone (ROCEPHIN) 2 g in sodium chloride 0.9 % 100 mL IVPB        2 g 200 mL/hr over 30 Minutes Intravenous Every 24 hours 11/23/21 1212 12/01/21 0559   11/23/21 1330  vancomycin (VANCOREADY) IVPB 1500 mg/300 mL       See Hyperspace for full Linked Orders Report.   1,500 mg 150 mL/hr over 120 Minutes Intravenous  Once 11/23/21 1219 11/23/21 1653   11/23/21 1230  vancomycin (VANCOCIN) IVPB 1000 mg/200 mL premix  See Hyperspace for full Linked Orders Report.   1,000 mg 200 mL/hr over 60 Minutes Intravenous  Once 11/23/21 1219 11/23/21 1438   11/23/21 1215  cefTRIAXone (ROCEPHIN) 2 g in sodium chloride 0.9 % 100 mL IVPB  Status:  Discontinued        2 g 200 mL/hr over 30 Minutes Intravenous Every 24 hours 11/23/21 1211 11/23/21 1212   11/23/21 1015  fosfomycin (MONUROL) packet 3 g  Status:  Discontinued        3 g Oral  Once 11/23/21 1009 11/23/21 1009   11/23/21 0930  cefTRIAXone (ROCEPHIN) 1 g in sodium chloride 0.9 % 100 mL IVPB        1 g 200 mL/hr over 30 Minutes Intravenous  Once 11/23/21 0917 11/23/21 1127   11/23/21 0900  cephALEXin (KEFLEX) capsule 500 mg  Status:  Discontinued        500 mg Oral  Once 11/23/21 0852 11/23/21 0917       Objective: Vitals:   11/24/21 0839 11/24/21 1223  BP: (!) 124/54 (!) 110/59  Pulse: 88 91  Resp: (!) 21 19  Temp: 98 F (36.7 C) 98.9 F (37.2 C)  SpO2: 96% 95%    Intake/Output Summary (Last 24 hours) at 11/24/2021 1326 Last data filed at 11/24/2021 0646 Gross per 24 hour  Intake --  Output 100 ml  Net -100 ml   Filed Weights   11/23/21 0430 11/24/21 0500  Weight: 112.5 kg 114.5 kg   Weight change: 2.008 kg Body mass index is 39.54 kg/m.   Physical Exam: General  exam: Pleasant, elderly Caucasian male. Skin: No rashes, lesions or ulcers. HEENT: Atraumatic, normocephalic, no obvious bleeding Lungs: Clear to auscultation bilaterally CVS: Regular rate and rhythm, no murmur GI/Abd soft, mild tenderness diffuse, distention, obesity, bowel sound present CNS: Opens eyes on verbal command, able to have a meaningful conversation when awake Psychiatry: Sad affect Extremities: No pedal edema, no calf tenderness  Data Review: I have personally reviewed the laboratory data and studies available.  F/u labs ordered Unresulted Labs (From admission, onward)     Start     Ordered   11/25/21 0500  Creatinine, serum  Tomorrow morning,   R        11/24/21 0711   11/25/21 0500  CBC with Differential/Platelet  Daily,   R      11/24/21 1326   11/25/21 2585  Basic metabolic panel  Daily,   R      11/24/21 1326            Signed, Terrilee Croak, MD [facility] 11/24/2021

## 2021-11-24 NOTE — Consult Note (Signed)
[place] Nurse wound consult note Consultation was completed by review of records, images and assistance from the bedside nurse/clinical staff.   Reason for Consult: stage 2 Patient admitted for urinary retention Wound type: Pressure injury: Stage 2 Pressure Injury POA: Yes Per Nursing Flow sheet Measurement:2cm x 2cm x 0.1cm  Wound bed: pink and red Drainage (amount, consistency, odor) none  Periwound: intact  Dressing procedure/placement/frequency:  Continue silicone foam as implemented by bedside nursing staff per skin care order set.    Re consult if needed, will not follow at this time. Thanks  Shelbie Franken [employer], RN,CWOCN, CNS, [place] [phone])

## 2021-11-24 NOTE — Progress Notes (Signed)
Pharmacy Antibiotic Note  [name] is a 86 y.o. male admitted on 11/23/2021.  Pharmacy has been consulted for vancomycin dosing for UTI. Pt with history of ampicillin resistant Enterococcus in urine culture. Multiple attempts to place foley in ED, urology able to place (likely stricture)   Today, 11/24/2021 12/7 Blood culture with GPC In 1/2 sets (both bottles), BCID E faecium (vancomycin susc as  VAN A/B gene not detected) Feb 2022 Ucx E faecium R to amp, susc to vanco Renal function: SCr 2.35 with appears to be above baseline with PMH of CKD, baseline ~2.15 WBC 21.7 (inc overnight) Tm 100.9   Plan: Adjust vancomycin to 1250mg  IV q48h based on current renal function and indication Est AUC: 484 Used: Scr 2.35, IBW, Vd 0.5 If SCr continues to worsen, dose per random levels ID to see F/u Cultures F/u ability to stop ceftriaxone   Height: 5\' 7"  (170.2 cm) Weight: 114.5 kg (252 lb 6.8 oz) IBW/kg (Calculated) : 66.1  Temp (24hrs), Avg:99.3 F (37.4 C), Min:98 F (36.7 C), Max:100.9 F (38.3 C)  Recent Labs  Lab 11/23/21 0518 11/23/21 0955 11/23/21 1355 11/24/21 0509  WBC 8.7  --   --  21.7*  CREATININE 2.10*  --   --  2.35*  LATICACIDVEN  --  2.5* 2.8* 2.7*     Estimated Creatinine Clearance: 28.3 mL/min (A) (by C-G formula based on SCr of 2.35 mg/dL (H)).    Allergies  Allergen Reactions   Chlorthalidone Other (See Comments)    Hyponatremia   Ace Inhibitors Cough   Hydrochlorothiazide    Lisinopril Other (See Comments)   Losartan    Other Other (See Comments)    "ANY BLOOD PRESSURE MEDICATIONS THAT I'VE TRIED" - PT. DOES NOT REMEMBER WHICH ONES "ANY BLOOD PRESSURE MEDICATIONS THAT I'VE TRIED"- PT. DOES NOT REMEMBER WHICH ONES- CONSTIPATION   Benazepril-Hydrochlorothiazide Other (See Comments)    Constipation    Antimicrobials this admission: 12/7 ceftriaxone >>  12/7 vancomycin >>   Microbiology results: 12/7 BCx: 1/2 sets GPC, BCID E faecalis.  No  resistance detected 12/7 UCx: pending  Thank you for allowing pharmacy to be a part of this patient's care.  Doreene Eland, PharmD, BCPS, BCIDP Work Cell: [phone] 11/24/2021 12:14 PM

## 2021-11-25 ENCOUNTER — Ambulatory Visit: Payer: HMO | Admitting: Physician Assistant

## 2021-11-25 LAB — BASIC METABOLIC PANEL
Anion gap: 9 (ref 5–15)
BUN: 36 mg/dL — ABNORMAL HIGH (ref 8–23)
CO2: 24 mmol/L (ref 22–32)
Calcium: 7.9 mg/dL — ABNORMAL LOW (ref 8.9–10.3)
Chloride: 100 mmol/L (ref 98–111)
Creatinine, Ser: 2.63 mg/dL — ABNORMAL HIGH (ref 0.61–1.24)
GFR, Estimated: 23 mL/min — ABNORMAL LOW (ref >60)
Glucose, Bld: 94 mg/dL (ref 70–99)
Potassium: 4.1 mmol/L (ref 3.5–5.1)
Sodium: 133 mmol/L — ABNORMAL LOW (ref 135–145)

## 2021-11-25 LAB — CBC WITH DIFFERENTIAL/PLATELET
Abs Immature Granulocytes: 1.24 10*3/uL — ABNORMAL HIGH (ref 0.00–0.07)
Basophils Absolute: 0.1 10*3/uL (ref 0.0–0.1)
Basophils Relative: 0 %
Eosinophils Absolute: 0.4 10*3/uL (ref 0.0–0.5)
Eosinophils Relative: 2 %
HCT: 25.7 % — ABNORMAL LOW (ref 39.0–52.0)
Hemoglobin: 8.5 g/dL — ABNORMAL LOW (ref 13.0–17.0)
Immature Granulocytes: 8 %
Lymphocytes Relative: 5 %
Lymphs Abs: 0.8 10*3/uL (ref 0.7–4.0)
MCH: 27.2 pg (ref 26.0–34.0)
MCHC: 33.1 g/dL (ref 30.0–36.0)
MCV: 82.4 fL (ref 80.0–100.0)
Monocytes Absolute: 1 10*3/uL (ref 0.1–1.0)
Monocytes Relative: 6 %
Neutro Abs: 12.5 10*3/uL — ABNORMAL HIGH (ref 1.7–7.7)
Neutrophils Relative %: 79 %
Platelets: 209 10*3/uL (ref 150–400)
RBC: 3.12 MIL/uL — ABNORMAL LOW (ref 4.22–5.81)
RDW: 15.9 % — ABNORMAL HIGH (ref 11.5–15.5)
WBC: 16 10*3/uL — ABNORMAL HIGH (ref 4.0–10.5)
nRBC: 0 % (ref 0.0–0.2)

## 2021-11-25 MED ORDER — SODIUM CHLORIDE 0.9 % IV SOLN: INTRAVENOUS | Status: DC

## 2021-11-25 NOTE — Progress Notes (Signed)
PROGRESS NOTE  [name]  DOB: [birth year]  PCP: Kathyrn Lass CHY:[id number]  DOA: 11/23/2021  LOS: 2 days  Hospital Day: 3  Chief Complaint  Patient presents with   Hematuria    Blood in urine last night    Brief narrative: [name] is a 86 y.o. male with PMH significant for HTN, HLD, PAD, CAD s/p stents, chronic combined systolic and diastolic CHF COPD, chronic smoking, chronic alcoholism, sleep apnea bipolar disorder, degenerative joint disease, history of prostate cancer treated with radiation therapy 1994, status post penile prosthesis in 2010 with revision in 2014.  Patient presented to the ED on 12/7 with complaint difficulty voiding for 24 hours. On 12/6, he felt difficulty voiding despite feeling that his bladder was full.  Next morning on 12/7, he apparently tried to put in a metal object through his urethra to relieve any 'obstruction'. He was able to urinate but it was grossly bloody.  He had severe suprapubic pain 10 out of 10 and hence was brought to the ED by EMS.  In the ED, bladder scan showed more than 669 mL of urine.  Urology had to be called for Foley catheter placement that drained purulent urine. Vital signs with temperature nine 9.1, initial heart rate 70s which later jumped to 100s Initial labs with sodium 130, creatinine 2.1, lactic acid elevated 2.5, WBC 8.7, hemoglobin 9.8 with platelet 319, INR 1.4 Urinalysis with hazy amber color urine with large amount of hemoglobin, large amount of leukocytes and many bacteria. Blood culture urine culture was sent. CT renal study was obtained which did not show any acute abnormality in the abdomen pelvis, specifically no hydronephrosis. Admitted to hospitalist service. Urology consultation was obtained.  Subjective: Patient was seen and examined this morning.   Propped up in bed.  Not in distress.  Reports abdominal pain is improving.  Clear urine.  No family at bedside.  Assessment/Plan: Sepsis secondary to  UTI Enterococcus faecium UTI -Presented with abdominal pain, urinary retention.  Purulent urine was drained in ED. -Noted to have low-grade temperature, leukocytosis, lactic acidosis -Urinalysis with large amount of leukocytes and many bacteria.  -Urine culture obtained on admission grew more than 100,000 CFU per mL of Enterococcus faecium.  Pending sensitivity. -Currently on IV vancomycin. -ID consult appreciated. -WBC count improving Recent Labs  Lab 11/23/21 0518 11/23/21 0955 11/23/21 1355 11/24/21 0509 11/25/21 0340  WBC 8.7  --   --  21.7* 16.0*  LATICACIDVEN  --  2.5* 2.8* 2.7*  --   PROCALCITON  --   --   --  >150.00  --    Acute urinary retention Hematuria BPH  -Patient apparently had UTI followed precipitating acute retention.  He apparently tried to relieve the 'obstruction' by inserting a metal rod through the urethra.  After that he started having hematuria and severe suprapubic pain.  He was brought to the ED and seen by urologist.  Foley catheter was placed.  Currently has clear urine. -Currently on Flomax. -CT renal stone on admission did not show any acute intra-abdominal or pelvic pathology.  -Currently on IV morphine for pain.  Patient seems sedated on examination this morning.  I would stop IV morphine.  Continue oxycodone as needed  AKI on CKD stage IIIb -Baseline creatinine 2.18 from September.  Presented with creatinine elevated 2.35 which worsened to 2.63 today. -Started on IV fluid today.  Lasix on hold.  Continue to monitor. Recent Labs    02/13/21 0424 02/14/21 0440 02/15/21 8786  02/16/21 0457 06/20/21 0803 08/26/21 1545 09/02/21 1418 11/23/21 0518 11/24/21 0509 11/25/21 0340  BUN 40* 45* 46* 48* 26* 13 15 20  24* 36*  CREATININE 2.15* 1.98* 2.18* 2.16* 2.19* 2.17* 2.18* 2.10* 2.35* 2.63*    Coronary artery disease -Continue aspirin, Plavix and statins.    h/o chronic combined systolic and diastolic dysfunction CHF -Home meds include Coreg,  Lasix, nitrate.  Currently on Coreg and nitrate.  Lasix on hold.  Blood pressures running low.  I would keep nitrate on hold as well.  Started on IV fluid.  Continue to reassess for CHF symptoms. -Last echo from 2020 with EF 45 to 50%, severely increased LV wall thickness.   COPD -Home meds include Trelegy, Atrovent  Bipolar disorder -On Seroquel  Mobility: Ordered PT eval. Living condition: Lives at home Goals of care:   Code Status: DNR  Nutritional status: Body mass index is 39.54 kg/m.      Diet:  Diet Order             Diet full liquid Room service appropriate? Yes; Fluid consistency: Thin  Diet effective now                  DVT prophylaxis:  enoxaparin (LOVENOX) injection 40 mg Start: 11/24/21 2200 SCDs Start: 11/23/21 1209   Antimicrobials: IV vancomycin Fluid: None Consultants: Urologist Family Communication: None at bedside  Status is: Inpatient  Continue in-hospital care because: Needs IV antibiotics, further ID work-up Level of care: Telemetry Medical   Dispo: The patient is from: Home              Anticipated d/c is to: Pending PT eval              Patient currently is not medically stable to d/c.   Difficult to place patient No     Infusions:   sodium chloride 75 mL/hr at 11/25/21 1100   vancomycin 1,250 mg (11/25/21 1002)    Scheduled Meds:  aspirin EC  81 mg Oral Daily   atorvastatin  40 mg Oral QPM   carvedilol  6.25 mg Oral BID   Chlorhexidine Gluconate Cloth  6 each Topical Daily   clopidogrel  75 mg Oral Daily   enoxaparin (LOVENOX) injection  40 mg Subcutaneous Q24H   feeding supplement  237 mL Oral TID BM   fluticasone furoate-vilanterol  1 puff Inhalation Daily   And   umeclidinium bromide  1 puff Inhalation Daily   ipratropium  2 spray Each Nare Daily   lubiprostone  8 mcg Oral BID   oxyCODONE  5 mg Oral Q8H   QUEtiapine  50 mg Oral QHS   tamsulosin  0.4 mg Oral Daily    PRN meds: acetaminophen **OR** acetaminophen,  albuterol, fluticasone, nitroGLYCERIN, ondansetron **OR** ondansetron (ZOFRAN) IV   Antimicrobials: Anti-infectives (From admission, onward)    Start     Dose/Rate Route Frequency Ordered Stop   11/25/21 1000  vancomycin (VANCOREADY) IVPB 1250 mg/250 mL        1,250 mg 166.7 mL/hr over 90 Minutes Intravenous Every 48 hours 11/24/21 1216     11/25/21 0100  vancomycin (VANCOCIN) IVPB 1000 mg/200 mL premix  Status:  Discontinued        1,000 mg 200 mL/hr over 60 Minutes Intravenous Every 36 hours 11/23/21 1227 11/24/21 1216   11/24/21 0600  cefTRIAXone (ROCEPHIN) 2 g in sodium chloride 0.9 % 100 mL IVPB  Status:  Discontinued  2 g 200 mL/hr over 30 Minutes Intravenous Every 24 hours 11/23/21 1212 11/24/21 1610   11/23/21 1330  vancomycin (VANCOREADY) IVPB 1500 mg/300 mL       See Hyperspace for full Linked Orders Report.   1,500 mg 150 mL/hr over 120 Minutes Intravenous  Once 11/23/21 1219 11/23/21 1653   11/23/21 1230  vancomycin (VANCOCIN) IVPB 1000 mg/200 mL premix       See Hyperspace for full Linked Orders Report.   1,000 mg 200 mL/hr over 60 Minutes Intravenous  Once 11/23/21 1219 11/23/21 1438   11/23/21 1215  cefTRIAXone (ROCEPHIN) 2 g in sodium chloride 0.9 % 100 mL IVPB  Status:  Discontinued        2 g 200 mL/hr over 30 Minutes Intravenous Every 24 hours 11/23/21 1211 11/23/21 1212   11/23/21 1015  fosfomycin (MONUROL) packet 3 g  Status:  Discontinued        3 g Oral  Once 11/23/21 1009 11/23/21 1009   11/23/21 0930  cefTRIAXone (ROCEPHIN) 1 g in sodium chloride 0.9 % 100 mL IVPB        1 g 200 mL/hr over 30 Minutes Intravenous  Once 11/23/21 0917 11/23/21 1127   11/23/21 0900  cephALEXin (KEFLEX) capsule 500 mg  Status:  Discontinued        500 mg Oral  Once 11/23/21 0852 11/23/21 0917       Objective: Vitals:   11/25/21 0832 11/25/21 1144  BP: 119/61 (!) 96/45  Pulse: 75 72  Resp: 18 20  Temp: 98.2 F (36.8 C) 98.7 F (37.1 C)  SpO2: 98% 95%     Intake/Output Summary (Last 24 hours) at 11/25/2021 1601 Last data filed at 11/25/2021 0414 Gross per 24 hour  Intake 240 ml  Output 625 ml  Net -385 ml   Filed Weights   11/23/21 0430 11/24/21 0500  Weight: 112.5 kg 114.5 kg   Weight change:  Body mass index is 39.54 kg/m.   Physical Exam: General exam: Pleasant, elderly Caucasian male.  Foley catheter with clear urine. Skin: No rashes, lesions or ulcers. HEENT: Atraumatic, normocephalic, no obvious bleeding Lungs: Clear to auscultation bilaterally CVS: Regular rate and rhythm, no murmur GI/Abd soft, mild tenderness diffuse, distention, obesity, bowel sound present CNS: Opens eyes on verbal command, able to have a meaningful conversation when awake Psychiatry: Sad affect Extremities: No pedal edema, no calf tenderness  Data Review: I have personally reviewed the laboratory data and studies available.  F/u labs ordered Unresulted Labs (From admission, onward)     Start     Ordered   11/26/21 0500  Lactic acid, plasma  Tomorrow morning,   R       Question:  Specimen collection method  Answer:  Lab=Lab collect   11/25/21 0823   11/26/21 0500  CULTURE, BLOOD (ROUTINE X 2) w Reflex to ID Panel  BLOOD CULTURE X 2,   TIMED      11/25/21 1236   11/25/21 0500  CBC with Differential/Platelet  Daily,   R      11/24/21 1326   11/25/21 9030  Basic metabolic panel  Daily,   R      11/24/21 1326            Signed, Terrilee Croak, MD [facility] 11/25/2021

## 2021-11-25 NOTE — Progress Notes (Signed)
Pharmacy Antibiotic Note  [name] is a 86 y.o. male admitted on 11/23/2021.  Pharmacy has been consulted for vancomycin dosing for UTI. Pt with history of ampicillin resistant Enterococcus in urine culture. Multiple attempts to place foley in ED, urology able to place (likely stricture). ID is following.  12/7 Blood culture with GPC In 1/2 sets (both bottles), BCID E faecium (vancomycin susc as  VAN A/B gene not detected) Feb 2022 Ucx E faecium R to amp, susc to vanco Renal function: SCr trending up 2.35>2.63 which appears to be above baseline with PMH of CKD, baseline ~2.15  Plan: Continue vancomycin 1250mg  IV q48h based on current renal function and indication Est AUC: 529.3 Used: Scr 2.35, IBW, Vd 0.5 If SCr continues to worsen, dose per random levels F/u Cultures  Height: 5\' 7"  (170.2 cm) Weight: 114.5 kg (252 lb 6.8 oz) IBW/kg (Calculated) : 66.1  Temp (24hrs), Avg:98.3 F (36.8 C), Min:98 F (36.7 C), Max:98.9 F (37.2 C)  Recent Labs  Lab 11/23/21 0518 11/23/21 0955 11/23/21 1355 11/24/21 0509 11/25/21 0340  WBC 8.7  --   --  21.7* 16.0*  CREATININE 2.10*  --   --  2.35* 2.63*  LATICACIDVEN  --  2.5* 2.8* 2.7*  --      Estimated Creatinine Clearance: 25.3 mL/min (A) (by C-G formula based on SCr of 2.63 mg/dL (H)).    Allergies  Allergen Reactions   Chlorthalidone Other (See Comments)    Hyponatremia   Ace Inhibitors Cough   Hydrochlorothiazide    Lisinopril Other (See Comments)   Losartan    Other Other (See Comments)    "ANY BLOOD PRESSURE MEDICATIONS THAT I'VE TRIED" - PT. DOES NOT REMEMBER WHICH ONES "ANY BLOOD PRESSURE MEDICATIONS THAT I'VE TRIED"- PT. DOES NOT REMEMBER WHICH ONES- CONSTIPATION   Benazepril-Hydrochlorothiazide Other (See Comments)    Constipation    Antimicrobials this admission: 12/7 ceftriaxone >> 12/8 12/7 vancomycin >>   Microbiology results: 12/7 BCx: 1/2 sets GPC, BCID E faecalis.  No resistance detected 12/7 UCx:  pending  Thank you for allowing pharmacy to be a part of this patient's care.  Sherilyn Banker, PharmD Clinical Pharmacist 11/25/2021 7:24 AM

## 2021-11-25 NOTE — Progress Notes (Signed)
Date of Admission:  11/23/2021    ID: [name] is a 86 y.o. male    Principal Problem:   Sepsis secondary to UTI [facility]) Active Problems:   CAD (coronary artery disease), native coronary artery   Essential hypertension   COPD (chronic obstructive pulmonary disease) (HCC)   Anemia in stage 3b chronic kidney disease (HCC)   Hypothyroidism   Type 2 diabetes mellitus with diabetic chronic kidney disease (HCC)   Pressure injury of skin    Subjective: In bed with eyes closed Daughter at bed side Pt is responding to questions appropriately Says he is feeling better  Medications:   aspirin EC  81 mg Oral Daily   atorvastatin  40 mg Oral QPM   carvedilol  6.25 mg Oral BID   Chlorhexidine Gluconate Cloth  6 each Topical Daily   clopidogrel  75 mg Oral Daily   enoxaparin (LOVENOX) injection  40 mg Subcutaneous Q24H   feeding supplement  237 mL Oral TID BM   fluticasone furoate-vilanterol  1 puff Inhalation Daily   And   umeclidinium bromide  1 puff Inhalation Daily   ipratropium  2 spray Each Nare Daily   lubiprostone  8 mcg Oral BID   oxyCODONE  5 mg Oral Q8H   QUEtiapine  50 mg Oral QHS   tamsulosin  0.4 mg Oral Daily    Objective: Vital signs in last 24 hours: Temp:  [98 F (36.7 C)-98.7 F (37.1 C)] 98.7 F (37.1 C) (12/09 1144) Pulse Rate:  [72-88] 72 (12/09 1144) Resp:  [16-20] 20 (12/09 1144) BP: (91-133)/(45-70) 96/45 (12/09 1144) SpO2:  [92 %-98 %] 95 % (12/09 1144)  PHYSICAL EXAM:  General: eyes closed but opens on calling his name- responds to questions appropriately- some discomofort Head: Normocephalic, without obvious abnormality, atraumatic. Eyes: b/l injection of conjuctiva Lungs:b/l air entry Heart: s1s2 Abdomen: Soft, non-tender,not distended. Bowel sounds normal. No masses foley Extremities: atraumatic, no cyanosis. No edema. No clubbing Skin: No rashes or lesions. Or bruising Lymph: Cervical, supraclavicular normal. Neurologic: Grossly  non-focal  Lab Results Recent Labs    11/24/21 0509 11/25/21 0340  WBC 21.7* 16.0*  HGB 9.5* 8.5*  HCT 29.5* 25.7*  NA 133* 133*  K 4.4 4.1  CL 99 100  CO2 22 24  BUN 24* 36*  CREATININE 2.35* 2.63*   Liver Panel No results for input(s): PROT, ALBUMIN, AST, ALT, ALKPHOS, BILITOT, BILIDIR, IBILI in the last 72 hours. Sedimentation Rate No results for input(s): ESRSEDRATE in the last 72 hours. C-Reactive Protein No results for input(s): CRP in the last 72 hours.  Microbiology: 11/23/21 BC - enterococcus faecium- Vancomycin resistance not detected UC- enterococcus faecium Studies/Results: No results found.   Assessment/Plan: Acute urinary retention- traumatic catheterization   Enterococcus bacteremia- with UTI Traumatic catheterization contributing to it No vancomycin resistance on BCID On  vanco as previously the bacteria was cultured in the urine in feb 2022 and was resistant to ampicillin. Await susceptibility Repeat blood culture  AKI on ckd- will change vanco to daptomycin On atorvastatin- risk for rhabdo when switched to dapto- will have to reduce atorvastatin to 20mg  or 10   Leucocytosis following catheterization - improving   Anemia       Pat has penile prosthesis- watch closely to r/o any hardware infection   H/o ca prostate - s/p radiation in 1994    ? __HTN   Ischemic cardiomyopathy- on carvedilol _____CAD- on plavix   RT THA  Discussed the  management with care team ID will follow him peripherally this weekend- call if needed

## 2021-11-26 LAB — CBC WITH DIFFERENTIAL/PLATELET
Abs Immature Granulocytes: 0.08 10*3/uL — ABNORMAL HIGH (ref 0.00–0.07)
Basophils Absolute: 0 10*3/uL (ref 0.0–0.1)
Basophils Relative: 0 %
Eosinophils Absolute: 0.3 10*3/uL (ref 0.0–0.5)
Eosinophils Relative: 3 %
HCT: 25.8 % — ABNORMAL LOW (ref 39.0–52.0)
Hemoglobin: 8.4 g/dL — ABNORMAL LOW (ref 13.0–17.0)
Immature Granulocytes: 1 %
Lymphocytes Relative: 12 %
Lymphs Abs: 1.3 10*3/uL (ref 0.7–4.0)
MCH: 26.9 pg (ref 26.0–34.0)
MCHC: 32.6 g/dL (ref 30.0–36.0)
MCV: 82.7 fL (ref 80.0–100.0)
Monocytes Absolute: 0.7 10*3/uL (ref 0.1–1.0)
Monocytes Relative: 6 %
Neutro Abs: 8.3 10*3/uL — ABNORMAL HIGH (ref 1.7–7.7)
Neutrophils Relative %: 78 %
Platelets: 186 10*3/uL (ref 150–400)
RBC: 3.12 MIL/uL — ABNORMAL LOW (ref 4.22–5.81)
RDW: 15.8 % — ABNORMAL HIGH (ref 11.5–15.5)
WBC: 10.7 10*3/uL — ABNORMAL HIGH (ref 4.0–10.5)
nRBC: 0 % (ref 0.0–0.2)

## 2021-11-26 LAB — BASIC METABOLIC PANEL
Anion gap: 8 (ref 5–15)
BUN: 37 mg/dL — ABNORMAL HIGH (ref 8–23)
CO2: 25 mmol/L (ref 22–32)
Calcium: 8 mg/dL — ABNORMAL LOW (ref 8.9–10.3)
Chloride: 103 mmol/L (ref 98–111)
Creatinine, Ser: 2.53 mg/dL — ABNORMAL HIGH (ref 0.61–1.24)
GFR, Estimated: 24 mL/min — ABNORMAL LOW (ref >60)
Glucose, Bld: 83 mg/dL (ref 70–99)
Potassium: 4.6 mmol/L (ref 3.5–5.1)
Sodium: 136 mmol/L (ref 135–145)

## 2021-11-26 LAB — LACTIC ACID, PLASMA: Lactic Acid, Venous: 1.2 mmol/L (ref 0.5–1.9)

## 2021-11-26 LAB — CULTURE, BLOOD (ROUTINE X 2)
Special Requests: ADEQUATE
Special Requests: ADEQUATE

## 2021-11-26 MED ORDER — ENOXAPARIN SODIUM 30 MG/0.3ML IJ SOSY
30.0000 mg | PREFILLED_SYRINGE | INTRAMUSCULAR | Status: DC
  Administered 2021-11-26: 30 mg via SUBCUTANEOUS
  Filled 2021-11-26: qty 0.3

## 2021-11-26 NOTE — Evaluation (Signed)
Physical Therapy Evaluation Patient Details Name: RAZI HICKLE MRN: [id number] DOB: 07/14/1935 Today's Date: 11/26/2021  History of Present Illness  Patient is an 86 year old male who was admitted to [facility] with sepsis secondary to UTI. When admitted to [facility] on 11/23/21,  patient reported diffiuclty voiding over last 24 hours. PMH includes chronic combined systolic and diastolic dysfunction CHF, stage III chronic kidney disease, coronary artery disease as well as COPD, bipolar disorder, history of prostate cancer treated with radiation therapy 1994.   Clinical Impression  Patient tolerated session fairly and was agreeable to treatment. Upon arrival patient was asleep with HOB elevated. Patient reported 8/10 pain globally. Patient was cooperative for right ROM/ MMT assessment (4/5), BLE sensation intact. When attempting to perform bed mobility/ EOB/ Ambulating tasks patient refused due to increased pain and stated "I have a broken left hip". When asked if patient was able to roll into R sidelying he stated he could but he won't because he is in extreme pain. RN was noted of patient's pain. Patient would benefit from skilled physical therapy during acute hospitalization to optimize return to patient's PLOF. Will continue to assess in future sessions as patient allows.       Recommendations for follow up therapy are one component of a multi-disciplinary discharge planning process, led by the attending physician.  Recommendations may be updated based on patient status, additional functional criteria and insurance authorization.  Follow Up Recommendations Skilled nursing-short term rehab (<3 hours/day) (pending additional mobility assessment)     Assistance Recommended at Discharge Intermittent Supervision/Assistance  Functional Status Assessment Patient has had a recent decline in their functional status and demonstrates the ability to make significant improvements in function in a reasonable and  predictable amount of time.  Equipment Recommendations  Rolling walker (2 wheels)    Recommendations for Other Services       Precautions / Restrictions Precautions Precautions: Fall Restrictions Weight Bearing Restrictions: No      Mobility  Bed Mobility Overal bed mobility:  (Unable to assess due to pain and patient refusal)                  Transfers Overall transfer level:  (Unable to assess due to pain and patient refusal)                      Ambulation/Gait Ambulation/Gait assistance:  (Unable to assess due to pain and patient refusal)                Stairs            Wheelchair Mobility    Modified Rankin (Stroke Patients Only)       Balance Overall balance assessment:  (Unable to assess due to pain and patient refusal)                                           Pertinent Vitals/Pain Pain Assessment: 0-10 Pain Score: 8  Pain Location: all over Pain Descriptors / Indicators: Grimacing;Sharp Pain Intervention(s): Limited activity within patient's tolerance;Monitored during session;Patient requesting pain meds-RN notified    Home Living Family/patient expects to be discharged to:: Private residence Living Arrangements: Alone Available Help at Discharge: Family;Available PRN/intermittently Type of Home: Apartment Home Access: Elevator       Home Layout: One level Home Equipment: Rollator (4 wheels);Wheelchair - manual Additional Comments: per chart  review: Lives on fourth floor of senior apartment complex    Prior Function                       Hand Dominance   Dominant Hand: Left    Extremity/Trunk Assessment   Upper Extremity Assessment Upper Extremity Assessment: Overall WFL for tasks assessed    Lower Extremity Assessment Lower Extremity Assessment: Generalized weakness;LLE deficits/detail LLE Deficits / Details: Patient refused LLE ROM and MMT as stated from patient "i have had a  broken hip for several months", B PF strenght 5/5, RLE hip flexion strength 4/5, RLE ROM WFL LLE: Unable to fully assess due to pain LLE Sensation: WNL       Communication   Communication: No difficulties  Cognition Arousal/Alertness: Awake/alert;Lethargic Behavior During Therapy: Agitated Overall Cognitive Status: Within Functional Limits for tasks assessed                                          General Comments      Exercises     Assessment/Plan    PT Assessment Patient needs continued PT services  PT Problem List Decreased strength;Decreased mobility;Decreased activity tolerance;Decreased knowledge of precautions;Decreased safety awareness       PT Treatment Interventions Therapeutic exercise;Functional mobility training;Therapeutic activities;Patient/family education;Gait training;DME instruction    PT Goals (Current goals can be found in the Care Plan section)  Acute Rehab PT Goals Patient Stated Goal: Patient wants to go home PT Goal Formulation: With patient Time For Goal Achievement: 12/10/21 Potential to Achieve Goals: Fair    Frequency 7X/week   Barriers to discharge Decreased caregiver support      Co-evaluation               AM-PAC PT "6 Clicks" Mobility  Outcome Measure Help needed turning from your back to your side while in a flat bed without using bedrails?: A Lot Help needed moving from lying on your back to sitting on the side of a flat bed without using bedrails?: A Lot Help needed moving to and from a bed to a chair (including a wheelchair)?: A Lot Help needed standing up from a chair using your arms (e.g., wheelchair or bedside chair)?: A Lot Help needed to walk in hospital room?: A Lot Help needed climbing 3-5 steps with a railing? : A Lot 6 Click Score: 14    End of Session   Activity Tolerance: Patient limited by pain;Treatment limited secondary to agitation Patient left: in bed;with bed alarm set;with call  bell/phone within reach Nurse Communication: Mobility status (and patient refusal to therapy)      Time: 1610-9604 PT Time Calculation (min) (ACUTE ONLY): 15 min   Charges:   PT Evaluation $PT Eval Low Complexity: 1 Low          Iva Boop, PT  11/26/21. 10:06 AM

## 2021-11-26 NOTE — Progress Notes (Signed)
Pharmacy Lovenox Dosing  86 y.o. male admitted with Hematuria (Blood in urine last night) . Patient ordered Lovenox 40 mg daily for VTE prophylaxis.   Filed Weights   11/23/21 0430 11/24/21 0500  Weight: 112.5 kg (248 lb) 114.5 kg (252 lb 6.8 oz)    Body mass index is 39.54 kg/m.  Estimated Creatinine Clearance: 26.3 mL/min (A) (by C-G formula based on SCr of 2.53 mg/dL (H)).  Will adjust Lovenox dosing to 30 mg Q24.   Skyrah Krupp A Steffen Hase 11/26/2021 11:57 AM

## 2021-11-26 NOTE — Progress Notes (Signed)
PROGRESS NOTE  [name]  DOB: January 29, 1935  PCP: Kathyrn Lass ZOX:[id number]  DOA: 11/23/2021  LOS: 3 days  Hospital Day: 4  Chief Complaint  Patient presents with   Hematuria    Blood in urine last night    Brief narrative: [name] is a 86 y.o. male with PMH significant for HTN, HLD, PAD, CAD s/p stents, chronic combined systolic and diastolic CHF COPD, chronic smoking, chronic alcoholism, sleep apnea bipolar disorder, degenerative joint disease, history of prostate cancer treated with radiation therapy 1994, status post penile prosthesis in 2010 with revision in 2014.  Patient presented to the ED on 12/7 with complaint difficulty voiding for 24 hours. On 12/6, he felt difficulty voiding despite feeling that his bladder was full.  Next morning on 12/7, he apparently tried to put in a metal object through his urethra to relieve any 'obstruction'. He was able to urinate but it was grossly bloody.  He had severe suprapubic pain 10 out of 10 and hence was brought to the ED by EMS.  In the ED, bladder scan showed more than 669 mL of urine.  Urology had to be called for Foley catheter placement that drained purulent urine. Vital signs with temperature nine 9.1, initial heart rate 70s which later jumped to 100s Initial labs with sodium 130, creatinine 2.1, lactic acid elevated 2.5, WBC 8.7, hemoglobin 9.8 with platelet 319, INR 1.4 Urinalysis with hazy amber color urine with large amount of hemoglobin, large amount of leukocytes and many bacteria. Blood culture urine culture was sent. CT renal study was obtained which did not show any acute abnormality in the abdomen pelvis, specifically no hydronephrosis. Admitted to hospitalist service. Urology consultation was obtained.  Subjective: Feeling better. Denies sob, cp, dizziness.   Assessment/Plan:  Sepsis due to uti /bacteremia Enteroc. Fae. Bactermia UTI ID following Traumatic catherization contributing to it Repeat cx  pending Wbc improving On vancomycin   Acute urinary retention Hematuria BPH  -Patient apparently had UTI followed precipitating acute retention.  He apparently tried to relieve the 'obstruction' by inserting a metal rod through the urethra.  After that he started having hematuria and severe suprapubic pain.  He was brought to the ED and seen by urologist.  Foley catheter was placed.   12/10 continue flomax CT renal - no acute issues in abd/pelvis    AKI on CKD stage IIIb Improving with ivf Decrease ivf rate to 33ml/hr Lasix on hold   Coronary artery disease Continue aspirin, Plavix, statin      h/o chronic combined systolic and diastolic dysfunction CHF -Home meds include Coreg, Lasix, nitrate.  Currently on Coreg and nitrate.  Lasix on hold.  Blood pressures running low.  I would keep nitrate on hold as well.  Started on IV fluid.  Continue to reassess for CHF symptoms. -Last echo from 2020 with EF 45 to 50%, severely increased LV wall thickness.   COPD -Home meds include Trelegy, Atrovent  Bipolar disorder -On Seroquel  Mobility: Ordered PT eval. Living condition: Lives at home Goals of care:   Code Status: DNR  Nutritional status: Body mass index is 39.54 kg/m.      Diet:  Diet Order             Diet full liquid Room service appropriate? Yes; Fluid consistency: Thin  Diet effective now                  DVT prophylaxis:  enoxaparin (LOVENOX) injection 40 mg Start:  11/24/21 2200 SCDs Start: 11/23/21 1209    Consultants: Urologist, ID Family Communication: None at bedside  Status is: Inpatient  Continue in-hospital care because: Needs IV antibiotics, further ID work-up Level of care: Telemetry Medical   Dispo: The patient is from: Home              Anticipated d/c is to: SNF per PT rec.              Patient currently is not medically stable to d/c.   Difficult to place patient No     Infusions:   sodium chloride 75 mL/hr at 11/26/21 0100    vancomycin Stopped (11/25/21 1133)    Scheduled Meds:  aspirin EC  81 mg Oral Daily   atorvastatin  40 mg Oral QPM   carvedilol  6.25 mg Oral BID   Chlorhexidine Gluconate Cloth  6 each Topical Daily   clopidogrel  75 mg Oral Daily   enoxaparin (LOVENOX) injection  40 mg Subcutaneous Q24H   feeding supplement  237 mL Oral TID BM   fluticasone furoate-vilanterol  1 puff Inhalation Daily   And   umeclidinium bromide  1 puff Inhalation Daily   ipratropium  2 spray Each Nare Daily   lubiprostone  8 mcg Oral BID   oxyCODONE  5 mg Oral Q8H   QUEtiapine  50 mg Oral QHS   tamsulosin  0.4 mg Oral Daily    PRN meds: acetaminophen **OR** acetaminophen, albuterol, fluticasone, nitroGLYCERIN, ondansetron **OR** ondansetron (ZOFRAN) IV   Antimicrobials: Anti-infectives (From admission, onward)    Start     Dose/Rate Route Frequency Ordered Stop   11/25/21 1000  vancomycin (VANCOREADY) IVPB 1250 mg/250 mL        1,250 mg 166.7 mL/hr over 90 Minutes Intravenous Every 48 hours 11/24/21 1216     11/25/21 0100  vancomycin (VANCOCIN) IVPB 1000 mg/200 mL premix  Status:  Discontinued        1,000 mg 200 mL/hr over 60 Minutes Intravenous Every 36 hours 11/23/21 1227 11/24/21 1216   11/24/21 0600  cefTRIAXone (ROCEPHIN) 2 g in sodium chloride 0.9 % 100 mL IVPB  Status:  Discontinued        2 g 200 mL/hr over 30 Minutes Intravenous Every 24 hours 11/23/21 1212 11/24/21 1610   11/23/21 1330  vancomycin (VANCOREADY) IVPB 1500 mg/300 mL       See Hyperspace for full Linked Orders Report.   1,500 mg 150 mL/hr over 120 Minutes Intravenous  Once 11/23/21 1219 11/23/21 1653   11/23/21 1230  vancomycin (VANCOCIN) IVPB 1000 mg/200 mL premix       See Hyperspace for full Linked Orders Report.   1,000 mg 200 mL/hr over 60 Minutes Intravenous  Once 11/23/21 1219 11/23/21 1438   11/23/21 1215  cefTRIAXone (ROCEPHIN) 2 g in sodium chloride 0.9 % 100 mL IVPB  Status:  Discontinued        2 g 200 mL/hr over  30 Minutes Intravenous Every 24 hours 11/23/21 1211 11/23/21 1212   11/23/21 1015  fosfomycin (MONUROL) packet 3 g  Status:  Discontinued        3 g Oral  Once 11/23/21 1009 11/23/21 1009   11/23/21 0930  cefTRIAXone (ROCEPHIN) 1 g in sodium chloride 0.9 % 100 mL IVPB        1 g 200 mL/hr over 30 Minutes Intravenous  Once 11/23/21 0917 11/23/21 1127   11/23/21 0900  cephALEXin (KEFLEX) capsule 500 mg  Status:  Discontinued        500 mg Oral  Once 11/23/21 0852 11/23/21 0917       Objective: Vitals:   11/26/21 0419 11/26/21 0815  BP: 139/63 133/78  Pulse: 69 65  Resp: 18 20  Temp: 98.7 F (37.1 C) 98 F (36.7 C)  SpO2: 98% 98%    Intake/Output Summary (Last 24 hours) at 11/26/2021 0836 Last data filed at 11/25/2021 2359 Gross per 24 hour  Intake 1650 ml  Output 575 ml  Net 1075 ml   Filed Weights   11/23/21 0430 11/24/21 0500  Weight: 112.5 kg 114.5 kg   Weight change:  Body mass index is 39.54 kg/m.   Physical Exam: Calm, nad Cta no w/r Reg s1/s2 no galloop Soft benign +bs Foley with yellow urine some clots No edema aaoxox3  Data Review: I have personally reviewed the laboratory data and studies available.  F/u labs ordered Unresulted Labs (From admission, onward)     Start     Ordered   11/26/21 0500  CULTURE, BLOOD (ROUTINE X 2) w Reflex to ID Panel  BLOOD CULTURE X 2,   TIMED      11/25/21 1236   11/25/21 0500  CBC with Differential/Platelet  Daily,   R      11/24/21 1326   11/25/21 2671  Basic metabolic panel  Daily,   R      11/24/21 1326            Signed, Nolberto Hanlon, MD [facility] 11/26/2021    35 min with >50% on coc

## 2021-11-27 LAB — CREATININE, SERUM
Creatinine, Ser: 2.13 mg/dL — ABNORMAL HIGH (ref 0.61–1.24)
GFR, Estimated: 30 mL/min — ABNORMAL LOW (ref >60)

## 2021-11-27 LAB — URINE CULTURE: Culture: >=100000 — AB

## 2021-11-27 MED ORDER — POLYETHYLENE GLYCOL 3350 17 G PO PACK
17.0000 g | PACK | Freq: Every day | ORAL | Status: DC
  Administered 2021-11-27 – 2021-12-04 (×7): 17 g via ORAL
  Filled 2021-11-27 (×7): qty 1

## 2021-11-27 MED ORDER — VANCOMYCIN HCL 1500 MG/300ML IV SOLN
1500.0000 mg | INTRAVENOUS | Status: DC
  Administered 2021-11-27: 10:00:00 1500 mg via INTRAVENOUS
  Filled 2021-11-27: qty 300

## 2021-11-27 MED ORDER — SENNOSIDES-DOCUSATE SODIUM 8.6-50 MG PO TABS
2.0000 | ORAL_TABLET | Freq: Two times a day (BID) | ORAL | Status: DC
  Administered 2021-11-27 – 2021-12-04 (×14): 2 via ORAL
  Filled 2021-11-27 (×15): qty 2

## 2021-11-27 MED ORDER — ENOXAPARIN SODIUM 40 MG/0.4ML IJ SOSY
40.0000 mg | PREFILLED_SYRINGE | INTRAMUSCULAR | Status: DC
  Administered 2021-11-27: 40 mg via SUBCUTANEOUS
  Filled 2021-11-27: qty 0.4

## 2021-11-27 NOTE — TOC Initial Note (Signed)
Transition of Care [facility]) - Initial/Assessment Note    Patient Details  Name: RASHAN ROUNSAVILLE MRN: [id number] Date of Birth: 08-14-1935  Transition of Care [facility]) CM/SW Contact:    Magnus Ivan, LCSW Phone Number: 11/27/2021, 2:33 PM  Clinical Narrative:                Met with patient at bedside to discuss DC planning. Patient is from home. Has been to [facility] and [facility] in the past for STR. Patient is agreeable to SNF rec. Prefers to go somewhere different. Patient has had 2 COVID vaccines and 1 booster. CSW is starting SNF work up.   Expected Discharge Plan: Skilled Nursing Facility Barriers to Discharge: Continued Medical Work up   Patient Goals and CMS Choice Patient states their goals for this hospitalization and ongoing recovery are:: agreeable to SNF CMS Medicare.gov Compare Post Acute Care list provided to:: Patient Choice offered to / list presented to : Patient  Expected Discharge Plan and Services Expected Discharge Plan: [place]       Living arrangements for the past 2 months: Single Family Home                                      Prior Living Arrangements/Services Living arrangements for the past 2 months: Single Family Home   Patient language and need for interpreter reviewed:: Yes Do you feel safe going back to the place where you live?: Yes      Need for Family Participation in Patient Care: Yes (Comment) Care giver support system in place?: Yes (comment) Current home services: DME Criminal Activity/Legal Involvement Pertinent to Current Situation/Hospitalization: No - Comment as needed  Activities of Daily Living Home Assistive Devices/Equipment: Gilford Rile (specify type) ADL Screening (condition at time of admission) Patient's cognitive ability adequate to safely complete daily activities?: Yes Is the patient deaf or have difficulty hearing?: No Does the patient have difficulty seeing, even when  wearing glasses/contacts?: No Does the patient have difficulty concentrating, remembering, or making decisions?: No Patient able to express need for assistance with ADLs?: Yes Does the patient have difficulty dressing or bathing?: Yes Independently performs ADLs?: Yes (appropriate for developmental age) Does the patient have difficulty walking or climbing stairs?: Yes Weakness of Legs: Both Weakness of Arms/Hands: None  Permission Sought/Granted Permission sought to share information with : Facility Art therapist granted to share information with : Yes, Verbal Permission Granted     Permission granted to share info w AGENCY: SNFs        Emotional Assessment       Orientation: : Oriented to Self, Oriented to Place, Oriented to  Time, Oriented to Situation Alcohol / Substance Use: Not Applicable Psych Involvement: No (comment)  Admission diagnosis:  Urinary retention [R33.9] Gross hematuria [R31.0] Acute cystitis with hematuria [N30.01] Sepsis secondary to UTI ([place]) [A41.9, N39.0] Nausea and vomiting, unspecified vomiting type [R11.2] Patient Active Problem List   Diagnosis Date Noted   Pressure injury of skin 11/24/2021   Sepsis secondary to UTI ([place]) 11/23/2021   Closed anterior dislocation of shoulder (Right) 08/02/2021   Enthesopathy of hip region 07/25/2021   Anterior dislocation of right shoulder 07/04/2021   Acute pain of right shoulder 06/29/2021   Therapeutic opioid-induced constipation (OIC) 06/29/2021   Chronic use of opiate for therapeutic purpose 06/06/2021   Hyposmolality and/or hyponatremia 04/15/2021   Hypothyroidism 04/15/2021  Type 2 diabetes mellitus with diabetic chronic kidney disease ([place]) 04/15/2021   Type 2 diabetes mellitus with diabetic peripheral angiopathy without gangrene ([place]) 04/15/2021   Weakness 02/11/2021   COVID-19 02/11/2021   Morbid obesity ([place]) 01/10/2021   Calcaneal fracture (Right) 01/10/2021   PVD (peripheral  vascular disease) ([place]) 61/44/3154   Chronic systolic CHF (congestive heart failure) ([place]) 01/10/2021   Hip fracture ([place]) 01/08/2021   Osteoarthritis of knee (Left) 01/06/2021   Arthropathy of knee (Left) 01/06/2021   Chronic knee pain (Left) 01/06/2021   Class 3 obesity with alveolar hypoventilation and body mass index (BMI) of 40.0 to 44.9 in adult ([place]) 12/23/2020   Hip joint effusion (Left) 12/23/2020   History of alcohol abuse 11/29/2020   Elevated rheumatoid factor 11/29/2020   Polyarthritis with positive rheumatoid factor ([place]) 11/17/2020   Elevated uric acid in blood 11/17/2020   CKD (chronic kidney disease), stage IV (HCC) 10/26/2020   Proteinuria, unspecified 10/26/2020   Elevated BUN 10/26/2020   Elevated serum creatinine 10/26/2020   Hypochloremia 10/26/2020   Hypocalcemia 10/26/2020   Hypoalbuminemia 10/26/2020   Elevated alkaline phosphatase level 10/26/2020   Elevated hemoglobin A1c 10/26/2020   Elevated random blood glucose level 10/26/2020   Anemia in stage 3b chronic kidney disease ([place]) 10/26/2020   Klippel-Feil deformity at the C5-C7 levels 10/26/2020   Carotid artery calcification, bilateral 10/26/2020   History of 2.0 cm thyroid mass (Left lobe) 10/26/2020    Class: History of   Bilateral renal cysts 10/26/2020   Lumbosacral spondylosis 10/26/2020   Osteoarthritis of facet joint of lumbar spine 10/26/2020   DDD (degenerative disc disease), lumbosacral 10/26/2020   History of total hip replacement (Right) 10/26/2020   Osteoarthritis of hip (Left) 10/26/2020   Diverticulosis of sigmoid colon 10/26/2020   Abnormal MRI, lumbar spine (10/07/2020) 10/26/2020   Lumbar central spinal stenosis with neurogenic claudication 10/26/2020   Lumbar foraminal stenosis 10/26/2020   Cyst of kidney, acquired 10/26/2020   Elevated C-reactive protein (CRP) 10/25/2020   Elevated sed rate 10/25/2020   Vitamin D deficiency 10/25/2020   Chronic anticoagulation (Plavix)  09/20/2020   Chronic hip pain (1ry area of Pain) (Left) 09/20/2020   Unilateral post-traumatic osteoarthritis of hip (Left) 09/20/2020   Chronic knee pain (2ry area of Pain) (Bilateral) 09/20/2020   Secondary osteoarthritis of knee (Bilateral) 09/20/2020   Osteoarthritis of knees (Bilateral) 09/20/2020   Chronic constipation 09/20/2020   Chronic shoulder pain (3ry area of Pain) (Bilateral) 09/20/2020   Grade 1 (3 mm) anterolisthesis of lumbar spine (L4-5) (stable) 09/20/2020   Chronic groin pain (Left) 09/20/2020   Other intervertebral disc degeneration, lumbar region 09/20/2020   Neurogenic pain 09/20/2020   Chronic pain syndrome 09/18/2020   Pharmacologic therapy 09/18/2020   Disorder of skeletal system 09/18/2020   Problems influencing health status 09/18/2020   Lower extremity pain (Bilateral) 05/15/2018   Elevated troponin I level 07/31/2017   Hyperkalemia 05/28/2017   Syncope 05/27/2017   Meningioma ([place]) 01/05/2017   GI bleed 12/01/2016   Alcohol abuse 10/30/2016   COPD (chronic obstructive pulmonary disease) ([place]) 10/04/2016   Obstructive sleep apnea 10/04/2016   Hyponatremia 08/31/2016   Shortness of breath 02/11/2016   Hyperlipidemia, unspecified 12/03/2015   Essential hypertension 07/17/2013   Erectile dysfunction 05/27/2013   CAD (coronary artery disease), native coronary artery 01/19/2004   PCP:  Pcp, No Pharmacy:   [place], [place] - [place] [address] [place] [address] Phone: [phone] Fax: [phone]  Social Determinants of Health (SDOH) Interventions    Readmission Risk Interventions Readmission Risk Prevention Plan 11/27/2021 02/17/2021  Transportation Screening Complete Complete  PCP or Specialist Appt within 3-5 Days - (No Data)  [place] or [place] - (No Data)  Social Work Consult for [place] Planning/Counseling - Complete  Medication Review Press photographer) Complete -  PCP  or Specialist appointment within 3-5 days of discharge Complete -  [place] or [place] Complete -  SW Recovery Care/Counseling Consult Complete -  Palliative Care Screening Not Applicable -  [place] Complete -  Some recent data might be hidden

## 2021-11-27 NOTE — Progress Notes (Addendum)
Pharmacy Antibiotic Note  [name] is a 86 y.o. male admitted on 11/23/2021.  Pharmacy has been consulted for vancomycin dosing for UTI. Pt with history of ampicillin resistant Enterococcus in urine culture. Multiple attempts to place foley in ED, urology able to place (likely stricture). ID is following.  12/7 Blood culture with GPC In 1/2 sets (both bottles), BCID E faecium (vancomycin susc as  VAN A/B gene not detected) Feb 2022 Ucx E faecium R to amp, susc to vanco Renal function: SCr trending down 2.63>2.13, near baseline  Plan: Renal function improving, changing to Vancomycin 1500mg  IV q48h based on current renal function and indication Est AUC: 536.2 Used: Scr 2.13, IBW, Vd 0.5 F/u Cultures  Height: 5\' 7"  (170.2 cm) Weight: 114.5 kg (252 lb 6.8 oz) IBW/kg (Calculated) : 66.1  Temp (24hrs), Avg:98.1 F (36.7 C), Min:97.7 F (36.5 C), Max:98.6 F (37 C)  Recent Labs  Lab 11/23/21 0518 11/23/21 0955 11/23/21 1355 11/24/21 0509 11/25/21 0340 11/26/21 0633 11/27/21 0652  WBC 8.7  --   --  21.7* 16.0* 10.7*  --   CREATININE 2.10*  --   --  2.35* 2.63* 2.53* 2.13*  LATICACIDVEN  --  2.5* 2.8* 2.7*  --  1.2  --      Estimated Creatinine Clearance: 31.2 mL/min (A) (by C-G formula based on SCr of 2.13 mg/dL (H)).    Allergies  Allergen Reactions   Chlorthalidone Other (See Comments)    Hyponatremia   Ace Inhibitors Cough   Hydrochlorothiazide    Lisinopril Other (See Comments)   Losartan    Other Other (See Comments)    "ANY BLOOD PRESSURE MEDICATIONS THAT I'VE TRIED" - PT. DOES NOT REMEMBER WHICH ONES "ANY BLOOD PRESSURE MEDICATIONS THAT I'VE TRIED"- PT. DOES NOT REMEMBER WHICH ONES- CONSTIPATION   Benazepril-Hydrochlorothiazide Other (See Comments)    Constipation    Antimicrobials this admission: 12/7 ceftriaxone >> 12/8 12/7 vancomycin >>   Microbiology results: 12/7 BCx: 1/2 sets GPC, BCID E faecalis.  No resistance detected 12/7 UCx: E.  faecaelis  Thank you for allowing pharmacy to be a part of this patient's care.  Pearla Dubonnet, PharmD Clinical Pharmacist 11/27/2021 9:12 AM

## 2021-11-27 NOTE — NC FL2 (Signed)
[place] LEVEL OF CARE SCREENING TOOL     IDENTIFICATION  Patient Name: [name] Birthdate: 1935-01-07 Sex: male Admission Date (Current Location): 11/23/2021  [facility] - [place] and [place] Number:  Engineering geologist and Address:  [facility], [address] [place], [place] [address]      Provider Number: [id number]  Attending Physician Name and Address:  Nolberto Hanlon, MD  Relative Name and Phone Number:  [name] (Daughter)   [phone] [facility])    Current Level of Care: Hospital Recommended Level of Care: [place] Prior Approval Number:    Date Approved/Denied:   PASRR Number: [id number]  Discharge Plan:      Current Diagnoses: Patient Active Problem List   Diagnosis Date Noted   Pressure injury of skin 11/24/2021   Sepsis secondary to UTI ([place]) 11/23/2021   Closed anterior dislocation of shoulder (Right) 08/02/2021   Enthesopathy of hip region 07/25/2021   Anterior dislocation of right shoulder 07/04/2021   Acute pain of right shoulder 06/29/2021   Therapeutic opioid-induced constipation (OIC) 06/29/2021   Chronic use of opiate for therapeutic purpose 06/06/2021   Hyposmolality and/or hyponatremia 04/15/2021   Hypothyroidism 04/15/2021   Type 2 diabetes mellitus with diabetic chronic kidney disease ([place]) 04/15/2021   Type 2 diabetes mellitus with diabetic peripheral angiopathy without gangrene ([place]) 04/15/2021   Weakness 02/11/2021   COVID-19 02/11/2021   Morbid obesity ([place]) 01/10/2021   Calcaneal fracture (Right) 01/10/2021   PVD (peripheral vascular disease) ([place]) 19/16/6060   Chronic systolic CHF (congestive heart failure) ([place]) 01/10/2021   Hip fracture ([place]) 01/08/2021   Osteoarthritis of knee (Left) 01/06/2021   Arthropathy of knee (Left) 01/06/2021   Chronic knee pain (Left) 01/06/2021   Class 3 obesity with alveolar hypoventilation and body mass index (BMI) of 40.0 to 44.9 in adult ([place])  12/23/2020   Hip joint effusion (Left) 12/23/2020   History of alcohol abuse 11/29/2020   Elevated rheumatoid factor 11/29/2020   Polyarthritis with positive rheumatoid factor ([place]) 11/17/2020   Elevated uric acid in blood 11/17/2020   CKD (chronic kidney disease), stage IV ([place]) 10/26/2020   Proteinuria, unspecified 10/26/2020   Elevated BUN 10/26/2020   Elevated serum creatinine 10/26/2020   Hypochloremia 10/26/2020   Hypocalcemia 10/26/2020   Hypoalbuminemia 10/26/2020   Elevated alkaline phosphatase level 10/26/2020   Elevated hemoglobin A1c 10/26/2020   Elevated random blood glucose level 10/26/2020   Anemia in stage 3b chronic kidney disease ([place]) 10/26/2020   Klippel-Feil deformity at the C5-C7 levels 10/26/2020   Carotid artery calcification, bilateral 10/26/2020   History of 2.0 cm thyroid mass (Left lobe) 10/26/2020   Bilateral renal cysts 10/26/2020   Lumbosacral spondylosis 10/26/2020   Osteoarthritis of facet joint of lumbar spine 10/26/2020   DDD (degenerative disc disease), lumbosacral 10/26/2020   History of total hip replacement (Right) 10/26/2020   Osteoarthritis of hip (Left) 10/26/2020   Diverticulosis of sigmoid colon 10/26/2020   Abnormal MRI, lumbar spine (10/07/2020) 10/26/2020   Lumbar central spinal stenosis with neurogenic claudication 10/26/2020   Lumbar foraminal stenosis 10/26/2020   Cyst of kidney, acquired 10/26/2020   Elevated C-reactive protein (CRP) 10/25/2020   Elevated sed rate 10/25/2020   Vitamin D deficiency 10/25/2020   Chronic anticoagulation (Plavix) 09/20/2020   Chronic hip pain (1ry area of Pain) (Left) 09/20/2020   Unilateral post-traumatic osteoarthritis of hip (Left) 09/20/2020   Chronic knee pain (2ry area of Pain) (Bilateral) 09/20/2020   Secondary osteoarthritis of knee (Bilateral)  09/20/2020   Osteoarthritis of knees (Bilateral) 09/20/2020   Chronic constipation 09/20/2020   Chronic shoulder pain (3ry area of Pain)  (Bilateral) 09/20/2020   Grade 1 (3 mm) anterolisthesis of lumbar spine (L4-5) (stable) 09/20/2020   Chronic groin pain (Left) 09/20/2020   Other intervertebral disc degeneration, lumbar region 09/20/2020   Neurogenic pain 09/20/2020   Chronic pain syndrome 09/18/2020   Pharmacologic therapy 09/18/2020   Disorder of skeletal system 09/18/2020   Problems influencing health status 09/18/2020   Lower extremity pain (Bilateral) 05/15/2018   Elevated troponin I level 07/31/2017   Hyperkalemia 05/28/2017   Syncope 05/27/2017   Meningioma ([place]) 01/05/2017   GI bleed 12/01/2016   Alcohol abuse 10/30/2016   COPD (chronic obstructive pulmonary disease) ([place]) 10/04/2016   Obstructive sleep apnea 10/04/2016   Hyponatremia 08/31/2016   Shortness of breath 02/11/2016   Hyperlipidemia, unspecified 12/03/2015   Essential hypertension 07/17/2013   Erectile dysfunction 05/27/2013   CAD (coronary artery disease), native coronary artery 01/19/2004    Orientation RESPIRATION BLADDER Height & Weight     Self, Time, Situation, Place  Normal Incontinent, External catheter Weight: 252 lb 6.8 oz (114.5 kg) Height:  5\' 7"  (170.2 cm)  BEHAVIORAL SYMPTOMS/MOOD NEUROLOGICAL BOWEL NUTRITION STATUS      Incontinent Diet (full liquid diet; thin liquids)  AMBULATORY STATUS COMMUNICATION OF NEEDS Skin   Limited Assist   Other (Comment) (stage 2 R thigh)                       Personal Care Assistance Level of Assistance  Bathing, Feeding, Dressing Bathing Assistance: Limited assistance Feeding assistance: Independent Dressing Assistance: Limited assistance     Functional Limitations Info             SPECIAL CARE FACTORS FREQUENCY  PT (By licensed PT), OT (By licensed OT)     PT Frequency: 5 x per week OT Frequency: 5 x per week            Contractures      Additional Factors Info  Code Status, Allergies Code Status Info: DNR Allergies Info: Chlorthalidone, Ace Inhibitors,  Hydrochlorothiazide, Lisinopril, Losartan, Other, Benazepril-hydrochlorothiazide           Current Medications (11/27/2021):  This is the current hospital active medication list Current Facility-Administered Medications  Medication Dose Route Frequency Provider Last Rate Last Admin   0.9 %  sodium chloride infusion   Intravenous Continuous Nolberto Hanlon, MD 50 mL/hr at 11/26/21 1747 Rate Change at 11/26/21 1747   acetaminophen (TYLENOL) tablet 650 mg  650 mg Oral Q6H PRN Agbata, Tochukwu, MD   650 mg at 11/27/21 0901   Or   acetaminophen (TYLENOL) suppository 650 mg  650 mg Rectal Q6H PRN Agbata, Tochukwu, MD       albuterol (PROVENTIL) (2.5 MG/3ML) 0.083% nebulizer solution 3 mL  3 mL Inhalation Q4H PRN Agbata, Tochukwu, MD       aspirin EC tablet 81 mg  81 mg Oral Daily Agbata, Tochukwu, MD   81 mg at 11/27/21 0901   atorvastatin (LIPITOR) tablet 40 mg  40 mg Oral QPM Agbata, Tochukwu, MD   40 mg at 11/26/21 2026   carvedilol (COREG) tablet 6.25 mg  6.25 mg Oral BID Terrilee Croak, MD   6.25 mg at 11/27/21 0909   Chlorhexidine Gluconate Cloth 2 % PADS 6 each  6 each Topical Daily Agbata, Tochukwu, MD   6 each at 11/27/21 0909   clopidogrel (PLAVIX) tablet  75 mg  75 mg Oral Daily Agbata, Tochukwu, MD   75 mg at 11/27/21 0900   enoxaparin (LOVENOX) injection 40 mg  40 mg Subcutaneous Q24H [place], Abby K, RPH       feeding supplement (ENSURE ENLIVE / ENSURE PLUS) liquid 237 mL  237 mL Oral TID BM Agbata, Tochukwu, MD   237 mL at 11/27/21 0909   fluticasone (FLONASE) 50 MCG/ACT nasal spray 2 spray  2 spray Each Nare Daily PRN Agbata, Tochukwu, MD       fluticasone furoate-vilanterol (BREO ELLIPTA) 200-25 MCG/ACT 1 puff  1 puff Inhalation Daily Nazari, Walid A, RPH   1 puff at 11/27/21 0909   And   umeclidinium bromide (INCRUSE ELLIPTA) 62.5 MCG/ACT 1 puff  1 puff Inhalation Daily Rito Ehrlich A, RPH   1 puff at 11/27/21 0909   ipratropium (ATROVENT) 0.03 % nasal spray 2 spray  2 spray Each  Nare Daily Agbata, Tochukwu, MD   2 spray at 11/27/21 0901   lubiprostone (AMITIZA) capsule 8 mcg  8 mcg Oral BID Agbata, Tochukwu, MD   8 mcg at 11/27/21 0901   nitroGLYCERIN (NITROSTAT) SL tablet 0.4 mg  0.4 mg Sublingual Q5 min PRN Agbata, Tochukwu, MD       ondansetron (ZOFRAN) tablet 4 mg  4 mg Oral Q6H PRN Agbata, Tochukwu, MD       Or   ondansetron (ZOFRAN) injection 4 mg  4 mg Intravenous Q6H PRN Agbata, Tochukwu, MD   4 mg at 11/23/21 1740   oxyCODONE (Oxy IR/ROXICODONE) immediate release tablet 5 mg  5 mg Oral Q8H Agbata, Tochukwu, MD   5 mg at 11/27/21 1314   polyethylene glycol (MIRALAX / GLYCOLAX) packet 17 g  17 g Oral Daily Nolberto Hanlon, MD   17 g at 11/27/21 0946   QUEtiapine (SEROQUEL) tablet 50 mg  50 mg Oral QHS Agbata, Tochukwu, MD   50 mg at 11/26/21 2026   senna-docusate (Senokot-S) tablet 2 tablet  2 tablet Oral BID Nolberto Hanlon, MD   2 tablet at 11/27/21 1314   tamsulosin (FLOMAX) capsule 0.4 mg  0.4 mg Oral Daily Agbata, Tochukwu, MD   0.4 mg at 11/27/21 0901   vancomycin (VANCOREADY) IVPB 1500 mg/300 mL  1,500 mg Intravenous Q48H Nazari, Walid A, RPH 150 mL/hr at 11/27/21 0945 1,500 mg at 11/27/21 0945     Discharge Medications: Please see discharge summary for a list of discharge medications.  Relevant Imaging Results:  Relevant Lab Results:   Additional Information SS #: [place], LCSW

## 2021-11-27 NOTE — Progress Notes (Signed)
PROGRESS NOTE  [name]  DOB: November 27, 1935  PCP: Kathyrn Lass LOV:[id number]  DOA: 11/23/2021  LOS: 4 days  Hospital Day: 5  Chief Complaint  Patient presents with   Hematuria    Blood in urine last night    Brief narrative: [name] is a 86 y.o. male with PMH significant for HTN, HLD, PAD, CAD s/p stents, chronic combined systolic and diastolic CHF COPD, chronic smoking, chronic alcoholism, sleep apnea bipolar disorder, degenerative joint disease, history of prostate cancer treated with radiation therapy 1994, status post penile prosthesis in 2010 with revision in 2014.  Patient presented to the ED on 12/7 with complaint difficulty voiding for 24 hours. On 12/6, he felt difficulty voiding despite feeling that his bladder was full.  Next morning on 12/7, he apparently tried to put in a metal object through his urethra to relieve any 'obstruction'. He was able to urinate but it was grossly bloody.  He had severe suprapubic pain 10 out of 10 and hence was brought to the ED by EMS.  In the ED, bladder scan showed more than 669 mL of urine.  Urology had to be called for Foley catheter placement that drained purulent urine. Vital signs with temperature nine 9.1, initial heart rate 70s which later jumped to 100s Initial labs with sodium 130, creatinine 2.1, lactic acid elevated 2.5, WBC 8.7, hemoglobin 9.8 with platelet 319, INR 1.4 Urinalysis with hazy amber color urine with large amount of hemoglobin, large amount of leukocytes and many bacteria. Blood culture urine culture was sent. CT renal study was obtained which did not show any acute abnormality in the abdomen pelvis, specifically no hydronephrosis. Admitted to hospitalist service. Urology consultation was obtained.  12/11 foley with clear urine, no blood clots  Subjective: No sob, no cp, no abd pain.    Assessment/Plan:  Sepsis due to uti /bacteremia Enteroc. Fae. Bactermia UTI ID following Traumatic catherization  contributing to it 12/11 follow-up with culture sensitivities  WBC improved  Continue vancomycin    Acute urinary retention Hematuria BPH  -Patient apparently had UTI followed precipitating acute retention.  He apparently tried to relieve the 'obstruction' by inserting a metal rod through the urethra.  After that he started having hematuria and severe suprapubic pain.  He was brought to the ED and seen by urologist.  Foley catheter was placed.   12/11 continue Flomax  CT renal no acute issues in abdomen/pelvis       AKI on CKD stage IIIb Improving with gentle hydration Lasix on hold Continue monitoring    Coronary artery disease Continue aspirin, Plavix, statin        h/o chronic combined systolic and diastolic dysfunction CHF -Home meds include Coreg, Lasix, nitrate.  Currently on Coreg and nitrate.  Lasix on hold.  Blood pressures running low.  I would keep nitrate on hold as well.  Started on IV fluid.  Continue to reassess for CHF symptoms. -Last echo from 2020 with EF 45 to 50%, severely increased LV wall thickness.   COPD -Home meds include Trelegy, Atrovent  Bipolar disorder -On Seroquel  Mobility: Ordered PT eval. Living condition: Lives at home Goals of care:   Code Status: DNR  Nutritional status: Body mass index is 39.54 kg/m.      Diet:  Diet Order             Diet full liquid Room service appropriate? Yes; Fluid consistency: Thin  Diet effective now  DVT prophylaxis:  enoxaparin (LOVENOX) injection 30 mg Start: 11/26/21 2200 SCDs Start: 11/23/21 1209    Consultants: Urologist, ID Family Communication: None at bedside  Status is: Inpatient  Continue in-hospital care because: Needs IV antibiotics, further ID work-up Level of care: Telemetry Medical   Dispo: The patient is from: Home              Anticipated d/c is to: SNF              Patient currently is not medically stable to d/c.   Difficult to place patient  No     Infusions:   sodium chloride 50 mL/hr at 11/26/21 1747   vancomycin Stopped (11/25/21 1133)    Scheduled Meds:  aspirin EC  81 mg Oral Daily   atorvastatin  40 mg Oral QPM   carvedilol  6.25 mg Oral BID   Chlorhexidine Gluconate Cloth  6 each Topical Daily   clopidogrel  75 mg Oral Daily   enoxaparin (LOVENOX) injection  30 mg Subcutaneous Q24H   feeding supplement  237 mL Oral TID BM   fluticasone furoate-vilanterol  1 puff Inhalation Daily   And   umeclidinium bromide  1 puff Inhalation Daily   ipratropium  2 spray Each Nare Daily   lubiprostone  8 mcg Oral BID   oxyCODONE  5 mg Oral Q8H   QUEtiapine  50 mg Oral QHS   tamsulosin  0.4 mg Oral Daily    PRN meds: acetaminophen **OR** acetaminophen, albuterol, fluticasone, nitroGLYCERIN, ondansetron **OR** ondansetron (ZOFRAN) IV   Antimicrobials: Anti-infectives (From admission, onward)    Start     Dose/Rate Route Frequency Ordered Stop   11/25/21 1000  vancomycin (VANCOREADY) IVPB 1250 mg/250 mL        1,250 mg 166.7 mL/hr over 90 Minutes Intravenous Every 48 hours 11/24/21 1216     11/25/21 0100  vancomycin (VANCOCIN) IVPB 1000 mg/200 mL premix  Status:  Discontinued        1,000 mg 200 mL/hr over 60 Minutes Intravenous Every 36 hours 11/23/21 1227 11/24/21 1216   11/24/21 0600  cefTRIAXone (ROCEPHIN) 2 g in sodium chloride 0.9 % 100 mL IVPB  Status:  Discontinued        2 g 200 mL/hr over 30 Minutes Intravenous Every 24 hours 11/23/21 1212 11/24/21 1610   11/23/21 1330  vancomycin (VANCOREADY) IVPB 1500 mg/300 mL       See Hyperspace for full Linked Orders Report.   1,500 mg 150 mL/hr over 120 Minutes Intravenous  Once 11/23/21 1219 11/23/21 1653   11/23/21 1230  vancomycin (VANCOCIN) IVPB 1000 mg/200 mL premix       See Hyperspace for full Linked Orders Report.   1,000 mg 200 mL/hr over 60 Minutes Intravenous  Once 11/23/21 1219 11/23/21 1438   11/23/21 1215  cefTRIAXone (ROCEPHIN) 2 g in sodium chloride  0.9 % 100 mL IVPB  Status:  Discontinued        2 g 200 mL/hr over 30 Minutes Intravenous Every 24 hours 11/23/21 1211 11/23/21 1212   11/23/21 1015  fosfomycin (MONUROL) packet 3 g  Status:  Discontinued        3 g Oral  Once 11/23/21 1009 11/23/21 1009   11/23/21 0930  cefTRIAXone (ROCEPHIN) 1 g in sodium chloride 0.9 % 100 mL IVPB        1 g 200 mL/hr over 30 Minutes Intravenous  Once 11/23/21 0917 11/23/21 1127   11/23/21 0900  cephALEXin (KEFLEX)  capsule 500 mg  Status:  Discontinued        500 mg Oral  Once 11/23/21 0852 11/23/21 0917       Objective: Vitals:   11/27/21 0525 11/27/21 0754  BP: (!) 168/79 (!) 159/73  Pulse: 70 69  Resp: 18 16  Temp: 97.7 F (36.5 C) 98.1 F (36.7 C)  SpO2: 100% 98%    Intake/Output Summary (Last 24 hours) at 11/27/2021 0852 Last data filed at 11/27/2021 0541 Gross per 24 hour  Intake 460 ml  Output 4300 ml  Net -3840 ml   Filed Weights   11/23/21 0430 11/24/21 0500  Weight: 112.5 kg 114.5 kg   Weight change:  Body mass index is 39.54 kg/m.   Physical Exam: sleepy, calm, NAD CTA no wheeze rales Regular S1-S2 no gallops Soft benign positive bowel sounds No edema Mood and affect appropriate in current setting  Data Review: I have personally reviewed the laboratory data and studies available.  F/u labs ordered Unresulted Labs (From admission, onward)    None       Signed, Nolberto Hanlon, MD [facility] 11/27/2021    Time spent: 35 min with >50% on coc

## 2021-11-27 NOTE — Progress Notes (Signed)
Physical Therapy Treatment Patient Details Name: DRAYVEN MARCHENA MRN: [id number] DOB: 02-10-1935 Today's Date: 11/27/2021   History of Present Illness Patient is an 86 year old male who was admitted to [facility] with sepsis secondary to UTI. When admitted to [facility] on 11/23/21,  patient reported diffiuclty voiding over last 24 hours. PMH includes chronic combined systolic and diastolic dysfunction CHF, stage III chronic kidney disease, coronary artery disease as well as COPD, bipolar disorder, history of prostate cancer treated with radiation therapy 1994.    PT Comments    Pt ready for session.  Participated in exercises as described below.  Initially stated he could not do it because of a broken hip.  He does have an incision L but it is well healed and when questioned further stated it was a long time ago but unable to date.  He is able to get to EOB with min a x 1 and cues.  Remains sitting for 15 minutes EOB with close supervision.  He does report some dizziness upon sitting and nausea but is relieved with time.  He returns to bed with min guard and repositions easily in bed.  Standing is deferred given dizziness and unable to coordinate care for +2 assist for safety.  Will plan standing and mobility progression tomorrow.   Recommendations for follow up therapy are one component of a multi-disciplinary discharge planning process, led by the attending physician.  Recommendations may be updated based on patient status, additional functional criteria and insurance authorization.  Follow Up Recommendations  Skilled nursing-short term rehab (<3 hours/day)     Assistance Recommended at Discharge    Equipment Recommendations  Rolling walker (2 wheels)    Recommendations for Other Services       Precautions / Restrictions Precautions Precautions: Fall Restrictions Weight Bearing Restrictions: No     Mobility  Bed Mobility Overal bed mobility: Needs Assistance Bed Mobility: Rolling Rolling:  Modified independent (Device/Increase time)              Transfers                        Ambulation/Gait                   Stairs             Wheelchair Mobility    Modified Rankin (Stroke Patients Only)       Balance Overall balance assessment: Needs assistance Sitting-balance support: Feet supported Sitting balance-Leahy Scale: Good                                      Cognition Arousal/Alertness: Awake/alert Behavior During Therapy: WFL for tasks assessed/performed Overall Cognitive Status: No family/caregiver present to determine baseline cognitive functioning                                          Exercises Other Exercises Other Exercises: supine AROM X !0    General Comments        Pertinent Vitals/Pain Pain Assessment: No/denies pain    Home Living                          Prior Function  PT Goals (current goals can now be found in the care plan section) Progress towards PT goals: Progressing toward goals    Frequency    7X/week      PT Plan Current plan remains appropriate    Co-evaluation              AM-PAC PT "6 Clicks" Mobility   Outcome Measure  Help needed turning from your back to your side while in a flat bed without using bedrails?: A Little Help needed moving from lying on your back to sitting on the side of a flat bed without using bedrails?: A Little Help needed moving to and from a bed to a chair (including a wheelchair)?: A Lot Help needed standing up from a chair using your arms (e.g., wheelchair or bedside chair)?: A Lot Help needed to walk in hospital room?: A Lot Help needed climbing 3-5 steps with a railing? : Total 6 Click Score: 13    End of Session   Activity Tolerance: Patient tolerated treatment well Patient left: in bed;with bed alarm set;with call bell/phone within reach Nurse Communication: Mobility status        Time: 4627-0350 PT Time Calculation (min) (ACUTE ONLY): 23 min  Charges:  $Therapeutic Exercise: 8-22 mins $Therapeutic Activity: 8-22 mins                    Chesley Noon, PTA 11/27/21, 10:16 AM

## 2021-11-28 ENCOUNTER — Inpatient Hospital Stay: Payer: HMO

## 2021-11-28 LAB — CREATININE, SERUM
Creatinine, Ser: 1.9 mg/dL — ABNORMAL HIGH (ref 0.61–1.24)
GFR, Estimated: 34 mL/min — ABNORMAL LOW (ref >60)

## 2021-11-28 MED ORDER — ENOXAPARIN SODIUM 60 MG/0.6ML IJ SOSY
0.5000 mg/kg | PREFILLED_SYRINGE | INTRAMUSCULAR | Status: DC
  Administered 2021-11-28 – 2021-12-04 (×7): 57.5 mg via SUBCUTANEOUS
  Filled 2021-11-28 (×7): qty 0.6

## 2021-11-28 MED ORDER — AMLODIPINE BESYLATE 5 MG PO TABS
5.0000 mg | ORAL_TABLET | Freq: Every day | ORAL | Status: DC
  Administered 2021-11-28 – 2021-12-05 (×8): 5 mg via ORAL
  Filled 2021-11-28 (×8): qty 1

## 2021-11-28 MED ORDER — LABETALOL HCL 5 MG/ML IV SOLN
20.0000 mg | INTRAVENOUS | Status: DC | PRN
  Administered 2021-11-28: 06:00:00 20 mg via INTRAVENOUS
  Filled 2021-11-28: qty 4

## 2021-11-28 NOTE — Progress Notes (Signed)
Date of Admission:  11/23/2021    ID: [name] is a 86 y.o. male    Principal Problem:   Sepsis secondary to UTI [facility]) Active Problems:   CAD (coronary artery disease), native coronary artery   Essential hypertension   COPD (chronic obstructive pulmonary disease) (HCC)   Anemia in stage 3b chronic kidney disease (HCC)   Hypothyroidism   Type 2 diabetes mellitus with diabetic chronic kidney disease (HCC)   Pressure injury of skin    Subjective: Awake and alert Says he hurts all over especially the joints- this is chronic but being in bed has made it worse  Medications:   amLODipine  5 mg Oral Daily   aspirin EC  81 mg Oral Daily   atorvastatin  40 mg Oral QPM   carvedilol  6.25 mg Oral BID   Chlorhexidine Gluconate Cloth  6 each Topical Daily   clopidogrel  75 mg Oral Daily   enoxaparin (LOVENOX) injection  0.5 mg/kg Subcutaneous Q24H   feeding supplement  237 mL Oral TID BM   fluticasone furoate-vilanterol  1 puff Inhalation Daily   And   umeclidinium bromide  1 puff Inhalation Daily   ipratropium  2 spray Each Nare Daily   lubiprostone  8 mcg Oral BID   oxyCODONE  5 mg Oral Q8H   polyethylene glycol  17 g Oral Daily   QUEtiapine  50 mg Oral QHS   senna-docusate  2 tablet Oral BID   tamsulosin  0.4 mg Oral Daily    Objective: Vital signs in last 24 hours: Temp:  [97.7 F (36.5 C)-98.2 F (36.8 C)] 97.7 F (36.5 C) (12/12 0854) Pulse Rate:  [66-75] 69 (12/12 0854) Resp:  [18-20] 19 (12/12 0854) BP: (145-173)/(71-79) 152/74 (12/12 0854) SpO2:  [97 %-100 %] 98 % (12/12 0854)  PHYSICAL EXAM:  General: awake and alert, responds to questions appropriately Some discomfort ankles Head: Normocephalic, without obvious abnormality, atraumatic. Eyes: b/l injection of conjuctiva Lungs:b/l air entry Heart: s1s2 Abdomen: Soft, non-tender,not distended. Bowel sounds normal. No masses foley Extremities: edema ankles Skin: No rashes or lesions. Or bruising Lymph:  Cervical, supraclavicular normal. Neurologic: Grossly non-focal  Lab Results Recent Labs    11/26/21 0633 11/27/21 0652 11/28/21 0353  WBC 10.7*  --   --   HGB 8.4*  --   --   HCT 25.8*  --   --   NA 136  --   --   K 4.6  --   --   CL 103  --   --   CO2 25  --   --   BUN 37*  --   --   CREATININE 2.53* 2.13* 1.90*  Microbiology: 11/23/21 BC - enterococcus faecium- Vancomycin resistance not detected UC- enterococcus faecium Studies/Results: No results found.   Assessment/Plan: Acute urinary retention- traumatic catheterization   Enterococcus bacteremia- with UTI Traumatic catheterization contributing to it. Amp resistant, vanco susceptible Enterococcus faecium Will need 2 weeks total of antibiotics end date 12/08/21. Because of CKD- will switch vanco to PO linezolid to finish the 2 weeks  AKI on ckd- on vanco q 48- will switch to PO linezolid   Leucocytosis following catheterization - almost normalized   Anemia       Pat has penile prosthesis- watch closely to r/o any hardware infection   H/o ca prostate - s/p radiation in 1994    ? __HTN   Ischemic cardiomyopathy- on carvedilol _____CAD- on plavix   RT THA  Discussed the management with patient and care team

## 2021-11-28 NOTE — Progress Notes (Addendum)
Physical Therapy Treatment Patient Details Name: [name] MRN: [id number] DOB: 08/19/1935 Today's Date: 11/28/2021   History of Present Illness Patient is an 86 year old male who was admitted to [facility] with sepsis secondary to UTI. When admitted to [facility] on 11/23/21,  patient reported diffiuclty voiding over last 24 hours. PMH includes chronic combined systolic and diastolic dysfunction CHF, stage III chronic kidney disease, coronary artery disease as well as COPD, bipolar disorder, history of prostate cancer treated with radiation therapy 1994.    PT Comments    Agrees to session and try to sit in chair.  Tech in to assist.  To EOB with min a x 1.  Stood x 2 at bedside with mod a x 2 but is unable to step or attempt transfer and sits promptly back on bed with c/o L hip pain.  Overall does well with bed mobility and standing but is limited by L hip.  Pt continues to state he has broken his L hip and it is not healed.  He has an old incision that is well healed.  He stated he broke it and that MD told him they could not do any surgery to fix it "They said I was too old and fat for surgery."  Pt is unable to state when fracture was and to date nothing has been found in chart.  Will reach out to primary MD as it is limiting his participation in mobility.  Discussed with RN.  Addendum: hip x-ray pending.  Will check results prior to next session,   Recommendations for follow up therapy are one component of a multi-disciplinary discharge planning process, led by the attending physician.  Recommendations may be updated based on patient status, additional functional criteria and insurance authorization.  Follow Up Recommendations  Skilled nursing-short term rehab (<3 hours/day)     Assistance Recommended at Discharge    Equipment Recommendations  Rolling walker (2 wheels)    Recommendations for Other Services       Precautions / Restrictions Precautions Precautions:  Fall Restrictions Weight Bearing Restrictions: No     Mobility  Bed Mobility Overal bed mobility: Needs Assistance   Rolling: Modified independent (Device/Increase time)              Transfers Overall transfer level: Needs assistance Equipment used: Rolling walker (2 wheels) Transfers: Sit to/from Stand Sit to Stand: Mod assist;+2 physical assistance           General transfer comment: able to stand but sits promptly due to pain L hip    Ambulation/Gait                   Stairs             Wheelchair Mobility    Modified Rankin (Stroke Patients Only)       Balance Overall balance assessment: Needs assistance Sitting-balance support: Feet supported Sitting balance-Leahy Scale: Good     Standing balance support: Bilateral upper extremity supported Standing balance-Leahy Scale: Poor                              Cognition Arousal/Alertness: Awake/alert Behavior During Therapy: WFL for tasks assessed/performed Overall Cognitive Status: No family/caregiver present to determine baseline cognitive functioning  Exercises      General Comments        Pertinent Vitals/Pain Pain Assessment: Faces Faces Pain Scale: Hurts whole lot Pain Location: L hip with mobility and wB Pain Descriptors / Indicators: Grimacing;Sharp Pain Intervention(s): Limited activity within patient's tolerance;Monitored during session;Repositioned    Home Living                          Prior Function            PT Goals (current goals can now be found in the care plan section) Progress towards PT goals: Progressing toward goals    Frequency    7X/week      PT Plan Current plan remains appropriate    Co-evaluation              AM-PAC PT "6 Clicks" Mobility   Outcome Measure  Help needed turning from your back to your side while in a flat bed without using  bedrails?: A Little Help needed moving from lying on your back to sitting on the side of a flat bed without using bedrails?: A Little Help needed moving to and from a bed to a chair (including a wheelchair)?: A Lot Help needed standing up from a chair using your arms (e.g., wheelchair or bedside chair)?: A Lot Help needed to walk in hospital room?: Total Help needed climbing 3-5 steps with a railing? : Total 6 Click Score: 12    End of Session   Activity Tolerance: Patient tolerated treatment well Patient left: in bed;with bed alarm set;with call bell/phone within reach Nurse Communication: Mobility status       Time: 5883-2549 PT Time Calculation (min) (ACUTE ONLY): 9 min  Charges:  $Therapeutic Activity: 8-22 mins                    Chesley Noon, PTA 11/28/21, 1:28 PM'

## 2021-11-28 NOTE — Progress Notes (Signed)
PHARMACIST - PHYSICIAN COMMUNICATION  CONCERNING:  Enoxaparin (Lovenox) for DVT Prophylaxis    RECOMMENDATION: Patient was prescribed enoxaparin 40 mg q24 hours for VTE prophylaxis.   Filed Weights   11/23/21 0430 11/24/21 0500  Weight: 112.5 kg (248 lb) 114.5 kg (252 lb 6.8 oz)    Body mass index is 39.54 kg/m.  Estimated Creatinine Clearance: 35 mL/min (A) (by C-G formula based on SCr of 1.9 mg/dL (H)).   Based on [place] patient is candidate for enoxaparin 0.5 mg/kg TBW SQ every 24 hours based on BMI > 30.   DESCRIPTION: Pharmacy has adjusted enoxaparin dose per [facility] policy.  Patient is now receiving enoxaparin 57.5 mg every 24 hours    Tawnya Crook, PharmD, BCPS Clinical Pharmacist 11/28/2021 7:25 AM

## 2021-11-28 NOTE — Care Management Important Message (Signed)
Important Message  Patient Details  Name: [name] MRN: [id number] Date of Birth: [birth year]   Medicare Important Message Given:  Yes     Dannette Barbara 11/28/2021, 11:45 AM

## 2021-11-28 NOTE — TOC Progression Note (Signed)
Transition of Care [facility]) - Progression Note    Patient Details  Name: [name] MRN: [id number] Date of Birth: 1934-12-30  Transition of Care [facility]) CM/SW [place], RN Phone Number: 11/28/2021, 4:15 PM  Clinical Narrative:   Patient has numerous bed offers and would like daughter to discuss.  Message left for daughter, awaiting return call.  TOC contact information provided, TOC to follow to discharge.    Expected Discharge Plan: Skilled Nursing Facility Barriers to Discharge: Continued Medical Work up  Expected Discharge Plan and Services Expected Discharge Plan: [place]       Living arrangements for the past 2 months: Single Family Home                                       Social Determinants of Health (SDOH) Interventions    Readmission Risk Interventions Readmission Risk Prevention Plan 11/27/2021 02/17/2021  Transportation Screening Complete Complete  PCP or Specialist Appt within 3-5 Days - (No Data)  [place] or [place] - (No Data)  Social Work Consult for [place] Planning/Counseling - Complete  Medication Review Press photographer) Complete -  PCP or Specialist appointment within 3-5 days of discharge Complete -  [place] or [place] Complete -  SW Recovery Care/Counseling Consult Complete -  Palliative Care Screening Not Applicable -  [place] Complete -  Some recent data might be hidden

## 2021-11-28 NOTE — Progress Notes (Signed)
PROGRESS NOTE  [name]  DOB: 11/23/1935  PCP: Kathyrn Lass SNK:[id number]  DOA: 11/23/2021  LOS: 5 days  Hospital Day: 6  Chief Complaint  Patient presents with   Hematuria    Blood in urine last night    Brief narrative: [name] is a 86 y.o. male with PMH significant for HTN, HLD, PAD, CAD s/p stents, chronic combined systolic and diastolic CHF COPD, chronic smoking, chronic alcoholism, sleep apnea bipolar disorder, degenerative joint disease, history of prostate cancer treated with radiation therapy 1994, status post penile prosthesis in 2010 with revision in 2014.  Patient presented to the ED on 12/7 with complaint difficulty voiding for 24 hours. On 12/6, he felt difficulty voiding despite feeling that his bladder was full.  Next morning on 12/7, he apparently tried to put in a metal object through his urethra to relieve any 'obstruction'. He was able to urinate but it was grossly bloody.  He had severe suprapubic pain 10 out of 10 and hence was brought to the ED by EMS.  In the ED, bladder scan showed more than 669 mL of urine.  Urology had to be called for Foley catheter placement that drained purulent urine. Vital signs with temperature nine 9.1, initial heart rate 70s which later jumped to 100s Initial labs with sodium 130, creatinine 2.1, lactic acid elevated 2.5, WBC 8.7, hemoglobin 9.8 with platelet 319, INR 1.4 Urinalysis with hazy amber color urine with large amount of hemoglobin, large amount of leukocytes and many bacteria. Blood culture urine culture was sent. CT renal study was obtained which did not show any acute abnormality in the abdomen pelvis, specifically no hydronephrosis. Admitted to hospitalist service. Urology consultation was obtained.  12/11 foley with clear urine, no blood clots 12/12 feels well. Denies sob, cp, abd pain.   Subjective: No chills, no n/v   Assessment/Plan:  Sepsis due to uti /bacteremia Enteroc. Fae.  Bactermia UTI Traumatic catherization contributing to it 12/12 cx's sensitive to vancomycin and nitrofurantoin. 12/11 follow-up with culture sensitivities  Leukocytosis improved ID following   Acute urinary retention Hematuria BPH  -Patient apparently had UTI followed precipitating acute retention.  He apparently tried to relieve the 'obstruction' by inserting a metal rod through the urethra.  After that he started having hematuria and severe suprapubic pain.  He was brought to the ED and seen by urologist.  Foley catheter was placed.   12/12 continue Flomax  CT renal no acute issues in abdomen and pelvis  Urology was consulted recommended indwelling Foley x7 days( initial insertion 12/7) Office follow-up next week for cystoscopy, if still here touch base with urology   AKI on CKD stage IIIb Lasix on hold Improving with gentle hydration, close to baseline Encourage patient to hydrate, will DC IV fluids   Coronary artery disease Continue aspirin, Plavix, statin         h/o chronic combined systolic and diastolic dysfunction CHF  -Last echo from 2020 with EF 45 to 50%, severely increased LV wall thickness. No acute exacerbation    COPD continue Trelegy, Atrovent  Bipolar disorder -On Seroquel  Mobility: Ordered PT eval. Living condition: Lives at home Goals of care:   Code Status: DNR  Nutritional status: Body mass index is 39.54 kg/m.      Diet:  Diet Order             Diet full liquid Room service appropriate? Yes; Fluid consistency: Thin  Diet effective now  DVT prophylaxis:  SCDs Start: 11/23/21 1209    Consultants: Urologist, ID Family Communication: None at bedside  Status is: Inpatient  Continue in-hospital care because: Needs IV antibiotics, further ID work-up Level of care: Telemetry Medical   Dispo: The patient is from: Home              Anticipated d/c is to: SNF              Patient currently is not medically  stable to d/c.   Difficult to place patient No     Infusions:   sodium chloride 50 mL/hr at 11/26/21 1747   vancomycin 1,500 mg (11/27/21 0945)    Scheduled Meds:  aspirin EC  81 mg Oral Daily   atorvastatin  40 mg Oral QPM   carvedilol  6.25 mg Oral BID   Chlorhexidine Gluconate Cloth  6 each Topical Daily   clopidogrel  75 mg Oral Daily   enoxaparin (LOVENOX) injection  0.5 mg/kg Subcutaneous Q24H   feeding supplement  237 mL Oral TID BM   fluticasone furoate-vilanterol  1 puff Inhalation Daily   And   umeclidinium bromide  1 puff Inhalation Daily   ipratropium  2 spray Each Nare Daily   lubiprostone  8 mcg Oral BID   oxyCODONE  5 mg Oral Q8H   polyethylene glycol  17 g Oral Daily   QUEtiapine  50 mg Oral QHS   senna-docusate  2 tablet Oral BID   tamsulosin  0.4 mg Oral Daily    PRN meds: acetaminophen **OR** acetaminophen, albuterol, fluticasone, labetalol, nitroGLYCERIN, ondansetron **OR** ondansetron (ZOFRAN) IV   Antimicrobials: Anti-infectives (From admission, onward)    Start     Dose/Rate Route Frequency Ordered Stop   11/27/21 0907  vancomycin (VANCOREADY) IVPB 1500 mg/300 mL        1,500 mg 150 mL/hr over 120 Minutes Intravenous Every 48 hours 11/27/21 0908     11/25/21 1000  vancomycin (VANCOREADY) IVPB 1250 mg/250 mL  Status:  Discontinued        1,250 mg 166.7 mL/hr over 90 Minutes Intravenous Every 48 hours 11/24/21 1216 11/27/21 0908   11/25/21 0100  vancomycin (VANCOCIN) IVPB 1000 mg/200 mL premix  Status:  Discontinued        1,000 mg 200 mL/hr over 60 Minutes Intravenous Every 36 hours 11/23/21 1227 11/24/21 1216   11/24/21 0600  cefTRIAXone (ROCEPHIN) 2 g in sodium chloride 0.9 % 100 mL IVPB  Status:  Discontinued        2 g 200 mL/hr over 30 Minutes Intravenous Every 24 hours 11/23/21 1212 11/24/21 1610   11/23/21 1330  vancomycin (VANCOREADY) IVPB 1500 mg/300 mL       See Hyperspace for full Linked Orders Report.   1,500 mg 150 mL/hr over 120  Minutes Intravenous  Once 11/23/21 1219 11/23/21 1653   11/23/21 1230  vancomycin (VANCOCIN) IVPB 1000 mg/200 mL premix       See Hyperspace for full Linked Orders Report.   1,000 mg 200 mL/hr over 60 Minutes Intravenous  Once 11/23/21 1219 11/23/21 1438   11/23/21 1215  cefTRIAXone (ROCEPHIN) 2 g in sodium chloride 0.9 % 100 mL IVPB  Status:  Discontinued        2 g 200 mL/hr over 30 Minutes Intravenous Every 24 hours 11/23/21 1211 11/23/21 1212   11/23/21 1015  fosfomycin (MONUROL) packet 3 g  Status:  Discontinued        3 g Oral  Once 11/23/21  1009 11/23/21 1009   11/23/21 0930  cefTRIAXone (ROCEPHIN) 1 g in sodium chloride 0.9 % 100 mL IVPB        1 g 200 mL/hr over 30 Minutes Intravenous  Once 11/23/21 0917 11/23/21 1127   11/23/21 0900  cephALEXin (KEFLEX) capsule 500 mg  Status:  Discontinued        500 mg Oral  Once 11/23/21 0852 11/23/21 0917       Objective: Vitals:   11/28/21 0529 11/28/21 0613  BP: (!) 173/79 (!) 160/74  Pulse: 72 70  Resp: 20   Temp: 98.2 F (36.8 C)   SpO2: 97%     Intake/Output Summary (Last 24 hours) at 11/28/2021 0839 Last data filed at 11/28/2021 0529 Gross per 24 hour  Intake 420 ml  Output 1950 ml  Net -1530 ml   Filed Weights   11/23/21 0430 11/24/21 0500  Weight: 112.5 kg 114.5 kg   Weight change:  Body mass index is 39.54 kg/m.   Physical Exam: Nad, calm Aaxoxo3 grossly intact Cta no w/r Reg s1/s2 no gallop Soft benign +bs No edema   Data Review: I have personally reviewed the laboratory data and studies available.  F/u labs ordered Unresulted Labs (From admission, onward)    None       Signed, Nolberto Hanlon, MD [facility] 11/28/2021    Time spent: 35 min with >50% on coc

## 2021-11-29 ENCOUNTER — Inpatient Hospital Stay: Payer: HMO

## 2021-11-29 LAB — CBC
HCT: 24.9 % — ABNORMAL LOW (ref 39.0–52.0)
Hemoglobin: 8.4 g/dL — ABNORMAL LOW (ref 13.0–17.0)
MCH: 26.8 pg (ref 26.0–34.0)
MCHC: 33.7 g/dL (ref 30.0–36.0)
MCV: 79.6 fL — ABNORMAL LOW (ref 80.0–100.0)
Platelets: 183 10*3/uL (ref 150–400)
RBC: 3.13 MIL/uL — ABNORMAL LOW (ref 4.22–5.81)
RDW: 15.5 % (ref 11.5–15.5)
WBC: 6.5 10*3/uL (ref 4.0–10.5)
nRBC: 0 % (ref 0.0–0.2)

## 2021-11-29 LAB — CREATININE, SERUM
Creatinine, Ser: 1.84 mg/dL — ABNORMAL HIGH (ref 0.61–1.24)
GFR, Estimated: 36 mL/min — ABNORMAL LOW (ref >60)

## 2021-11-29 LAB — VANCOMYCIN, RANDOM: Vancomycin Rm: 22

## 2021-11-29 MED ORDER — LINEZOLID 600 MG PO TABS
600.0000 mg | ORAL_TABLET | Freq: Two times a day (BID) | ORAL | Status: DC
  Administered 2021-11-29 – 2021-12-05 (×12): 600 mg via ORAL
  Filled 2021-11-29 (×13): qty 1

## 2021-11-29 MED ORDER — LIDOCAINE 5 % EX PTCH
1.0000 | MEDICATED_PATCH | CUTANEOUS | Status: DC
  Administered 2021-11-29 – 2021-12-05 (×7): 1 via TRANSDERMAL
  Filled 2021-11-29 (×7): qty 1

## 2021-11-29 MED ORDER — MORPHINE SULFATE (PF) 2 MG/ML IV SOLN
1.0000 mg | Freq: Once | INTRAVENOUS | Status: AC
  Administered 2021-11-29: 13:00:00 1 mg via INTRAVENOUS
  Filled 2021-11-29: qty 1

## 2021-11-29 NOTE — Progress Notes (Signed)
Physical Therapy Treatment Patient Details Name: [name] MRN: [id number] DOB: [birth year] Today's Date: 11/29/2021   History of Present Illness Patient is an 86 year old male who was admitted to [facility] with sepsis secondary to UTI. When admitted to [facility] on 11/23/21,  patient reported diffiuclty voiding over last 24 hours. PMH includes chronic combined systolic and diastolic dysfunction CHF, stage III chronic kidney disease, coronary artery disease as well as COPD, bipolar disorder, history of prostate cancer treated with radiation therapy 1994.    PT Comments    Pt was eager to try and get up to the recliner, and though he did manage to do so with only minimal PT intervention it was unsafe, he showed inability to get even close to upright standing, crashed uncontrolledly to the recliner, did not confidently take weight through either LE (appears to have subacute R ankle strain on top of chronic L hip pain).  He c/o pain with nearly all L hip movement (both active and passive).  He is motivated to go home, but does realize that he is not safe at this time to do so.  Open to the idea of rehab.   Recommendations for follow up therapy are one component of a multi-disciplinary discharge planning process, led by the attending physician.  Recommendations may be updated based on patient status, additional functional criteria and insurance authorization.  Follow Up Recommendations  Skilled nursing-short term rehab (<3 hours/day)     Assistance Recommended at Discharge Intermittent Supervision/Assistance  Equipment Recommendations       Recommendations for Other Services       Precautions / Restrictions Precautions Precautions: Fall Restrictions Weight Bearing Restrictions: No Other Position/Activity Restrictions: Pt has had chronic L hip pain/weakness     Mobility  Bed Mobility Overal bed mobility: Needs Assistance Bed Mobility: Supine to Sit     Supine to sit: Min guard;Min assist      General bed mobility comments: Pt was able to do majority of the transition to EOB w/o assist, light HHA to assist him pulling up torso    Transfers Overall transfer level: Needs assistance Equipment used: Rolling walker (2 wheels) Transfers: Bed to chair/wheelchair/BSC     Squat pivot transfers: Min assist;Mod assist       General transfer comment: Pt struggled with transition bed to recliner, but was insistent on trying to do it on his own.  He clearly was hesistant to fully use L LE, but also c/o R ankle pain that further limited his already non-confident transfer.  He did manage to side scoot in bed to set up closer transition and though he managed to get to recliner he had uncontrolled crash back to the recliner;  Highly reliant on UEs t/o the effort and poor ability to meaningfully take weight through either leg. Frankly he barely managed to get to the recliner.    Ambulation/Gait               General Gait Details: unsafe to attempt   Stairs             Wheelchair Mobility    Modified Rankin (Stroke Patients Only)       Balance Overall balance assessment: Needs assistance Sitting-balance support: Feet supported Sitting balance-Leahy Scale: Good     Standing balance support: Bilateral upper extremity supported Standing balance-Leahy Scale: Poor Standing balance comment: unable to attain upright and ultimately very limited with WBing tolerance on both LEs  Cognition Arousal/Alertness: Awake/alert Behavior During Therapy: WFL for tasks assessed/performed Overall Cognitive Status: No family/caregiver present to determine baseline cognitive functioning                                          Exercises General Exercises - Lower Extremity Ankle Circles/Pumps: AROM;10 reps Quad Sets: Strengthening;10 reps Heel Slides: AAROM;5 reps (pain in L) Hip ABduction/ADduction: 10 reps;AROM    General  Comments General comments (skin integrity, edema, etc.): Pt does not recall a specific incident but reports ~1 weeks of R ankle pain.  Ligaments appear intact but ankle eversion (peroneals) and less so DF are very pain limited and it seems likely he had inversion strain/sprain during a transfer in the recent past further limiting his already limited mobility.      Pertinent Vitals/Pain Pain Assessment: 0-10 Pain Score: 7  Pain Location: L hip with mobility and WB    Home Living                          Prior Function            PT Goals (current goals can now be found in the care plan section) Progress towards PT goals: Progressing toward goals    Frequency    Min 2X/week      PT Plan Frequency needs to be updated    Co-evaluation              AM-PAC PT "6 Clicks" Mobility   Outcome Measure  Help needed turning from your back to your side while in a flat bed without using bedrails?: A Little Help needed moving from lying on your back to sitting on the side of a flat bed without using bedrails?: A Little Help needed moving to and from a bed to a chair (including a wheelchair)?: A Lot Help needed standing up from a chair using your arms (e.g., wheelchair or bedside chair)?: A Lot Help needed to walk in hospital room?: Total Help needed climbing 3-5 steps with a railing? : Total 6 Click Score: 12    End of Session Equipment Utilized During Treatment: Gait belt Activity Tolerance: Patient limited by pain Patient left: with chair alarm set;with call bell/phone within reach   PT Visit Diagnosis: Muscle weakness (generalized) (M62.81);Pain;Unsteadiness on feet (R26.81);Difficulty in walking, not elsewhere classified (R26.2) Pain - Right/Left: Left Pain - part of body: Hip     Time: 3568-6168 PT Time Calculation (min) (ACUTE ONLY): 33 min  Charges:  $Therapeutic Exercise: 8-22 mins $Therapeutic Activity: 8-22 mins                     Kreg Shropshire,  DPT 11/29/2021, 1:48 PM

## 2021-11-29 NOTE — TOC Progression Note (Signed)
Transition of Care [facility]) - Progression Note    Patient Details  Name: [name] MRN: [id number] Date of Birth: [birth year]  Transition of [facility]) CM/SW [place], RN Phone Number: 11/29/2021, 9:16 AM  Clinical Narrative:   Daughter returned phone call this morning.  RNCM provided SNF choices to daughter.  She will speak to her sister and return call with their SNF choice this afternoon.  No other concerns voiced at this time.   TOC contact information provided.    Expected Discharge Plan: Skilled Nursing Facility Barriers to Discharge: Continued Medical Work up  Expected Discharge Plan and Services Expected Discharge Plan: [place]       Living arrangements for the past 2 months: Single Family Home                                       Social Determinants of Health (SDOH) Interventions    Readmission Risk Interventions Readmission Risk Prevention Plan 11/27/2021 02/17/2021  Transportation Screening Complete Complete  PCP or Specialist Appt within 3-5 Days - (No Data)  [place] or [place] - (No Data)  Social Work Consult for [place] Planning/Counseling - Complete  Medication Review Press photographer) Complete -  PCP or Specialist appointment within 3-5 days of discharge Complete -  [place] or [place] Complete -  SW Recovery Care/Counseling Consult Complete -  Palliative Care Screening Not Applicable -  [place] Complete -  Some recent data might be hidden

## 2021-11-29 NOTE — Progress Notes (Addendum)
Full consult note and patient examination to follow.  Patient admitted with Enterococcus UTI and bacteremia.  WBC has normalized with appropriate antibiotic treatment.  He has remained afebrile since admission.  Plan for discharge to SNF.  Imaging reviewed. Appears to have L hip blade plate and progressive erosion of left femoral head and acetabulum. Patient has x-ray,CT, and MRI from Jan 2022 showing similar changes, although not as extensive.  Patient could benefit from removal of hardware and conversion to total hip arthroplasty.  The surgery would likely need to be done as an outpatient.  He can weight-bear as tolerated in the meantime and mobilize with PT/OT as needed.  May need use of walker.

## 2021-11-29 NOTE — Progress Notes (Addendum)
PROGRESS NOTE  [name]  DOB: [birth year]  PCP: Kathyrn Lass KGM:[id number]  DOA: 11/23/2021  LOS: 6 days  Hospital Day: 7  Chief Complaint  Patient presents with   Hematuria    Blood in urine last night    Brief narrative: GARRETT MITCHUM is a 86 y.o. male with PMH significant for HTN, HLD, PAD, CAD s/p stents, chronic combined systolic and diastolic CHF COPD, chronic smoking, chronic alcoholism, sleep apnea bipolar disorder, degenerative joint disease, history of prostate cancer treated with radiation therapy 1994, status post penile prosthesis in 2010 with revision in 2014.  Patient presented to the ED on 12/7 with complaint difficulty voiding for 24 hours. On 12/6, he felt difficulty voiding despite feeling that his bladder was full.  Next morning on 12/7, he apparently tried to put in a metal object through his urethra to relieve any 'obstruction'. He was able to urinate but it was grossly bloody.  He had severe suprapubic pain 10 out of 10 and hence was brought to the ED by EMS.  In the ED, bladder scan showed more than 669 mL of urine.  Urology had to be called for Foley catheter placement that drained purulent urine. CT renal study was obtained which did not show any acute abnormality in the abdomen pelvis, specifically no hydronephrosis. Admitted to hospitalist service. Urology was consulted and they placed foley.   12/13 c/o Lt hip pain this am. RUE swollen more than baseline. D/w nursing to change IV line to the other arm. Xr hip reviewed and discussed with orthopedics, Dr. Posey Pronto.     Subjective: Denies shortness of breath, chest pain, nausea or vomiting.  Only complaining of left hip pain.   Assessment/Plan:  Sepsis due to uti /bacteremia Enteroc. Fae. Bactermia UTI Traumatic catherization contributing to it 12/13 cultures with Enterococcus ID following, switch vancomycin to p.o. linezolid to finish 2 weeks.  End date 12/08/2021. Has penile prosthesis was closely to  rule out any hardware infection   Acute urinary retention Hematuria BPH  -Patient apparently had UTI followed precipitating acute retention.  He apparently tried to relieve the 'obstruction' by inserting a metal rod through the urethra.  After that he started having hematuria and severe suprapubic pain.  CT renal no acute issues in abdomen and pelvis  12/13 urology was consulted and recommended indwelling Foley x7 days (initial insertion 12/7).  Then do voiding trial but discussed with urology prior to taking Foley out. Office follow-up next week for cystoscopy, if still here may want to touch base with urology about seeing the patient.   AKI on CKD stage IIIb Lasix on hold Improved with IV fluids, IV fluids was discontinued encourage hydration p.o. Creatinine down to 1.8 today  Left hip Pain X-ray revealed progressive bony destruction of the left hip joint which could reflect progressive particle disease.  Radiology recommended aspiration if concern for septic arthritis. Orthopedics was consulted, Dr. Posey Pronto to see.  RUE swelling Appears more swollen today. No pain Asked nsg to move iv site to Lt  Will obtain venous US r/o dvt   Coronary artery disease Continue aspirin, Plavix, statins      h/o chronic combined systolic and diastolic dysfunction CHF -Last echo from 2020 with EF 45 to 50%, severely increased LV wall thickness. No acute exacerbation    COPD Continue inhalers  Bipolar disorder -On Seroquel  Living condition: Lives at home Goals of care:   Code Status: DNR  Nutritional status: Body mass index is 39.54  kg/m.      Diet:  Diet Order             Diet full liquid Room service appropriate? Yes; Fluid consistency: Thin  Diet effective now                  DVT prophylaxis:  SCDs Start: 11/23/21 1209 Lovenox    Consultants: Urologist, ID, orthopedics Family Communication: None at bedside  Status is: Inpatient  Continue in-hospital care  because: Needs IV antibiotics, further ID work-up Level of care: Telemetry Medical   Dispo: The patient is from: Home              Anticipated d/c is to: SNF              Patient currently is not medically stable to d/c.   Difficult to place patient No     Infusions:     Scheduled Meds:  amLODipine  5 mg Oral Daily   aspirin EC  81 mg Oral Daily   atorvastatin  40 mg Oral QPM   carvedilol  6.25 mg Oral BID   Chlorhexidine Gluconate Cloth  6 each Topical Daily   clopidogrel  75 mg Oral Daily   enoxaparin (LOVENOX) injection  0.5 mg/kg Subcutaneous Q24H   feeding supplement  237 mL Oral TID BM   fluticasone furoate-vilanterol  1 puff Inhalation Daily   And   umeclidinium bromide  1 puff Inhalation Daily   ipratropium  2 spray Each Nare Daily   lubiprostone  8 mcg Oral BID   oxyCODONE  5 mg Oral Q8H   polyethylene glycol  17 g Oral Daily   QUEtiapine  50 mg Oral QHS   senna-docusate  2 tablet Oral BID   tamsulosin  0.4 mg Oral Daily    PRN meds: acetaminophen **OR** acetaminophen, albuterol, fluticasone, labetalol, nitroGLYCERIN, ondansetron **OR** ondansetron (ZOFRAN) IV   Antimicrobials: Anti-infectives (From admission, onward)    Start     Dose/Rate Route Frequency Ordered Stop   11/27/21 0907  vancomycin (VANCOREADY) IVPB 1500 mg/300 mL  Status:  Discontinued        1,500 mg 150 mL/hr over 120 Minutes Intravenous Every 48 hours 11/27/21 0908 11/28/21 1445   11/25/21 1000  vancomycin (VANCOREADY) IVPB 1250 mg/250 mL  Status:  Discontinued        1,250 mg 166.7 mL/hr over 90 Minutes Intravenous Every 48 hours 11/24/21 1216 11/27/21 0908   11/25/21 0100  vancomycin (VANCOCIN) IVPB 1000 mg/200 mL premix  Status:  Discontinued        1,000 mg 200 mL/hr over 60 Minutes Intravenous Every 36 hours 11/23/21 1227 11/24/21 1216   11/24/21 0600  cefTRIAXone (ROCEPHIN) 2 g in sodium chloride 0.9 % 100 mL IVPB  Status:  Discontinued        2 g 200 mL/hr over 30 Minutes  Intravenous Every 24 hours 11/23/21 1212 11/24/21 1610   11/23/21 1330  vancomycin (VANCOREADY) IVPB 1500 mg/300 mL       See Hyperspace for full Linked Orders Report.   1,500 mg 150 mL/hr over 120 Minutes Intravenous  Once 11/23/21 1219 11/23/21 1653   11/23/21 1230  vancomycin (VANCOCIN) IVPB 1000 mg/200 mL premix       See Hyperspace for full Linked Orders Report.   1,000 mg 200 mL/hr over 60 Minutes Intravenous  Once 11/23/21 1219 11/23/21 1438   11/23/21 1215  cefTRIAXone (ROCEPHIN) 2 g in sodium chloride 0.9 % 100 mL IVPB  Status:  Discontinued        2 g 200 mL/hr over 30 Minutes Intravenous Every 24 hours 11/23/21 1211 11/23/21 1212   11/23/21 1015  fosfomycin (MONUROL) packet 3 g  Status:  Discontinued        3 g Oral  Once 11/23/21 1009 11/23/21 1009   11/23/21 0930  cefTRIAXone (ROCEPHIN) 1 g in sodium chloride 0.9 % 100 mL IVPB        1 g 200 mL/hr over 30 Minutes Intravenous  Once 11/23/21 0917 11/23/21 1127   11/23/21 0900  cephALEXin (KEFLEX) capsule 500 mg  Status:  Discontinued        500 mg Oral  Once 11/23/21 0852 11/23/21 0917       Objective: Vitals:   11/29/21 0353 11/29/21 0757  BP: (!) 150/61 (!) 155/61  Pulse: 79 69  Resp: 20 19  Temp: 98.5 F (36.9 C) 98.1 F (36.7 C)  SpO2: 98% 96%    Intake/Output Summary (Last 24 hours) at 11/29/2021 0819 Last data filed at 11/29/2021 0354 Gross per 24 hour  Intake 0 ml  Output 700 ml  Net -700 ml   Filed Weights   11/23/21 0430 11/24/21 0500  Weight: 112.5 kg 114.5 kg   Weight change:  Body mass index is 39.54 kg/m.   Physical Exam: Nad, calm Cta no w/r/r Reg s1/s2 nogallop Soft benign +bs  RUE edema >>Lt aaoxox3  Data Review: I have personally reviewed the laboratory data and studies available.  F/u labs ordered Unresulted Labs (From admission, onward)     Start     Ordered   11/29/21 0500  Vancomycin, random  Tomorrow morning,   R       Question:  Specimen collection method  Answer:   Lab=Lab collect   11/28/21 1445            Signed, Nolberto Hanlon, MD [facility] 11/29/2021    Time spent: 45 min with >50% on coc

## 2021-11-30 DIAGNOSIS — R338 Other retention of urine: Secondary | ICD-10-CM

## 2021-11-30 DIAGNOSIS — R7881 Bacteremia: Secondary | ICD-10-CM

## 2021-11-30 LAB — BASIC METABOLIC PANEL
Anion gap: 5 (ref 5–15)
BUN: 28 mg/dL — ABNORMAL HIGH (ref 8–23)
CO2: 20 mmol/L — ABNORMAL LOW (ref 22–32)
Calcium: 7.5 mg/dL — ABNORMAL LOW (ref 8.9–10.3)
Chloride: 107 mmol/L (ref 98–111)
Creatinine, Ser: 2.08 mg/dL — ABNORMAL HIGH (ref 0.61–1.24)
GFR, Estimated: 31 mL/min — ABNORMAL LOW (ref >60)
Glucose, Bld: 83 mg/dL (ref 70–99)
Potassium: 4.3 mmol/L (ref 3.5–5.1)
Sodium: 132 mmol/L — ABNORMAL LOW (ref 135–145)

## 2021-11-30 LAB — CBC
HCT: 23.3 % — ABNORMAL LOW (ref 39.0–52.0)
Hemoglobin: 7.6 g/dL — ABNORMAL LOW (ref 13.0–17.0)
MCH: 26.4 pg (ref 26.0–34.0)
MCHC: 32.6 g/dL (ref 30.0–36.0)
MCV: 80.9 fL (ref 80.0–100.0)
Platelets: 187 10*3/uL (ref 150–400)
RBC: 2.88 MIL/uL — ABNORMAL LOW (ref 4.22–5.81)
RDW: 15.5 % (ref 11.5–15.5)
WBC: 6.8 10*3/uL (ref 4.0–10.5)
nRBC: 0 % (ref 0.0–0.2)

## 2021-11-30 NOTE — Progress Notes (Signed)
PROGRESS NOTE    [name]  RFF:[id number] DOB: 13-Mar-1935 DOA: 11/23/2021 PCP: Pcp, No  Assessment & Plan:   Principal Problem:   Sepsis secondary to UTI [facility]) Active Problems:   CAD (coronary artery disease), native coronary artery   Essential hypertension   COPD (chronic obstructive pulmonary disease) (HCC)   Anemia in stage 3b chronic kidney disease ([place])   Hypothyroidism   Type 2 diabetes mellitus with diabetic chronic kidney disease ([place])   Pressure injury of skin   Sepsis: secondary to UTI & bacteremia. Continue on linezolid x 2 weeks as per ID   Bacteremia: blood cxs growing enterococcus faecium. Likely secondary to UTI. Continue on linezolid x 2 weeks as per ID  UTI: likely traumatic catherization contributing to it.  Urine cx growing enterococcus faecium. Continue on linezolid x  weeks as per ID. Has penile prosthesis was closely to rule out any hardware infection   Acute urinary retention: w/ hx of BPH. Apparently pt tried to relieve the 'obstruction' by inserting a metal rod through the urethra.  After that he started having hematuria and severe suprapubic pain.  CT renal no acute issues in abdomen and pelvis. Continue on foley x 7 days as per urology (foley placed on 11/23/21). Office follow-up next week for cystoscopy, if still here may want to touch base with urology about seeing the patient.  Dementia: as per pt's daughter. Continue  on seroquel & w/ supportive care    AKI on CKD stage IIIb: Cr is labile. Avoid nephrotoxic meds   Hyponatremia: labile. Will continue to monitor   Likely ACD: likely secondary to CKD. 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  Needs removal of hardware and conversion to total hip arthroplasty but surgery would likely need to be done as an outpatient as per ortho surg    RUE swelling: [place] of RUE was neg for DVT      CAD: continue on aspirin, statin & plavix  Hx of chronic combined CHF: echo from 2020 with EF 45 to 50%, severely increased LV wall thickness. Appears euvolemic. Monitor I/Os   COPD: w/o exacerbation. Continue on bronchodilators   Bipolar disorder: continue on seroquel    DVT prophylaxis: lovenox  Code Status: DNR Family Communication:  Disposition Plan: likely d/c to SNF  Level of care: Telemetry Medical  Status is: Inpatient  Remains inpatient appropriate because: waiting on SNF placement     Consultants:  Ortho surg   Procedures:   Antimicrobials: linezolid    Subjective: Pt is confused   Objective: Vitals:   11/30/21 0023 11/30/21 0523 11/30/21 0805 11/30/21 1221  BP: 138/62 137/65 139/66 (!) 127/58  Pulse: 65 65 69 71  Resp: 20 18 20 18   Temp: 98.6 F (37 C) 98.7 F (37.1 C) 99.3 F (37.4 C) 99.2 F (37.3 C)  TempSrc: Oral Oral Oral   SpO2: 99% 97% 97% 96%  Weight:      Height:        Intake/Output Summary (Last 24 hours) at 11/30/2021 1405 Last data filed at 11/30/2021 0528 Gross per 24 hour  Intake 240 ml  Output 400 ml  Net -160 ml   Filed Weights   11/23/21 0430 11/24/21 0500  Weight: 112.5 kg 114.5 kg    Examination:  General exam: Appears confused  Respiratory system: Clear to auscultation. Respiratory effort normal. Cardiovascular system: S1 &  S2 +. No rubs, gallops or clicks Gastrointestinal system: Abdomen is obese, soft and nontender.  Normal bowel sounds heard. Central nervous system: Alert and awake. Moves all extremities Psychiatry: Judgement and insight appear poor. Flat mood and affect    Data Reviewed: I have personally reviewed following labs and imaging studies  CBC: Recent Labs  Lab 11/24/21 0509 11/25/21 0340 11/26/21 0633 11/29/21 0643 11/30/21 0909  WBC 21.7* 16.0* 10.7* 6.5 6.8  NEUTROABS  --  12.5* 8.3*  --   --   HGB 9.5* 8.5* 8.4* 8.4* 7.6*  HCT 29.5* 25.7* 25.8* 24.9* 23.3*  MCV 82.2 82.4 82.7  79.6* 80.9  PLT 249 209 186 183 774   Basic Metabolic Panel: Recent Labs  Lab 11/24/21 0509 11/25/21 0340 11/26/21 0633 11/27/21 0652 11/28/21 0353 11/29/21 0643 11/30/21 0909  NA 133* 133* 136  --   --   --  132*  K 4.4 4.1 4.6  --   --   --  4.3  CL 99 100 103  --   --   --  107  CO2 22 24 25   --   --   --  20*  GLUCOSE 82 94 83  --   --   --  83  BUN 24* 36* 37*  --   --   --  28*  CREATININE 2.35* 2.63* 2.53* 2.13* 1.90* 1.84* 2.08*  CALCIUM 8.2* 7.9* 8.0*  --   --   --  7.5*  MG 1.5*  --   --   --   --   --   --    GFR: Estimated Creatinine Clearance: 32 mL/min (A) (by C-G formula based on SCr of 2.08 mg/dL (H)). Liver Function Tests: No results for input(s): AST, ALT, ALKPHOS, BILITOT, PROT, ALBUMIN in the last 168 hours. No results for input(s): LIPASE, AMYLASE in the last 168 hours. No results for input(s): AMMONIA in the last 168 hours. Coagulation Profile: Recent Labs  Lab 11/24/21 0509  INR 1.2   Cardiac Enzymes: No results for input(s): CKTOTAL, CKMB, CKMBINDEX, TROPONINI in the last 168 hours. BNP (last 3 results) No results for input(s): PROBNP in the last 8760 hours. HbA1C: No results for input(s): HGBA1C in the last 72 hours. CBG: No results for input(s): GLUCAP in the last 168 hours. Lipid Profile: No results for input(s): CHOL, HDL, LDLCALC, TRIG, CHOLHDL, LDLDIRECT in the last 72 hours. Thyroid Function Tests: No results for input(s): TSH, T4TOTAL, FREET4, T3FREE, THYROIDAB in the last 72 hours. Anemia Panel: No results for input(s): VITAMINB12, FOLATE, FERRITIN, TIBC, IRON, RETICCTPCT in the last 72 hours. Sepsis Labs: Recent Labs  Lab 11/24/21 0509 11/26/21 0633  PROCALCITON >150.00  --   LATICACIDVEN 2.7* 1.2    Recent Results (from the past 240 hour(s))  Urine Culture     Status: Abnormal   Collection Time: 11/23/21  7:41 AM   Specimen: Urine, Catheterized  Result Value Ref Range Status   Specimen Description   Final    URINE,  CATHETERIZED Performed at [facility], [address], [place], [place] [address]    Special Requests   Final    NONE Performed at [facility], [address] [place], [place] [address]    Culture >=100,000 COLONIES/mL ENTEROCOCCUS FAECIUM (A)  Final   Report Status 11/27/2021 FINAL  Final   Organism ID, Bacteria ENTEROCOCCUS FAECIUM (A)  Final      Susceptibility   Enterococcus faecium - MIC*  AMPICILLIN RESISTANT Resistant     NITROFURANTOIN <=16 SENSITIVE Sensitive     VANCOMYCIN <=0.5 SENSITIVE Sensitive     * >=100,000 COLONIES/mL ENTEROCOCCUS FAECIUM  Blood culture (routine x 2)     Status: Abnormal   Collection Time: 11/23/21  9:55 AM   Specimen: BLOOD  Result Value Ref Range Status   Specimen Description   Final    BLOOD RIGHT FA Performed at [facility], [address] [place], [place] [address]    Special Requests   Final    BOTTLES DRAWN AEROBIC AND ANAEROBIC Blood Culture adequate volume Performed at [facility], [address] [place], [place] [address]    Culture  Setup Time   Final    Organism ID to follow GRAM POSITIVE COCCI IN BOTH AEROBIC AND ANAEROBIC BOTTLES CRITICAL RESULT CALLED TO, READ BACK BY AND VERIFIED WITH: Lu Duffel 11/24/21 0857 MW Performed at [facility], [address] [place], [place] [address]    Culture ENTEROCOCCUS FAECIUM (A)  Final   Report Status 11/26/2021 FINAL  Final   Organism ID, Bacteria ENTEROCOCCUS FAECIUM  Final      Susceptibility   Enterococcus faecium - MIC*    AMPICILLIN 4 SENSITIVE Sensitive     VANCOMYCIN <=0.5 SENSITIVE Sensitive     GENTAMICIN SYNERGY SENSITIVE Sensitive     * ENTEROCOCCUS FAECIUM  Blood Culture ID Panel (Reflexed)     Status: Abnormal   Collection Time: 11/23/21  9:55 AM  Result Value Ref Range Status   Enterococcus faecalis NOT DETECTED NOT DETECTED Final   Enterococcus Faecium DETECTED (A) NOT DETECTED Final    Comment: RESULT  CALLED TO, READ BACK BY AND VERIFIED WITH: [facility] 11/24/21 0857 MW    Listeria monocytogenes NOT DETECTED NOT DETECTED Final   Staphylococcus species NOT DETECTED NOT DETECTED Final   Staphylococcus aureus (BCID) NOT DETECTED NOT DETECTED Final   Staphylococcus epidermidis NOT DETECTED NOT DETECTED Final   Staphylococcus lugdunensis NOT DETECTED NOT DETECTED Final   Streptococcus species NOT DETECTED NOT DETECTED Final   Streptococcus agalactiae NOT DETECTED NOT DETECTED Final   Streptococcus pneumoniae NOT DETECTED NOT DETECTED Final   Streptococcus pyogenes NOT DETECTED NOT DETECTED Final   A.calcoaceticus-baumannii NOT DETECTED NOT DETECTED Final   Bacteroides fragilis NOT DETECTED NOT DETECTED Final   Enterobacterales NOT DETECTED NOT DETECTED Final   Enterobacter cloacae complex NOT DETECTED NOT DETECTED Final   Escherichia coli NOT DETECTED NOT DETECTED Final   Klebsiella aerogenes NOT DETECTED NOT DETECTED Final   Klebsiella oxytoca NOT DETECTED NOT DETECTED Final   Klebsiella pneumoniae NOT DETECTED NOT DETECTED Final   Proteus species NOT DETECTED NOT DETECTED Final   Salmonella species NOT DETECTED NOT DETECTED Final   Serratia marcescens NOT DETECTED NOT DETECTED Final   Haemophilus influenzae NOT DETECTED NOT DETECTED Final   Neisseria meningitidis NOT DETECTED NOT DETECTED Final   Pseudomonas aeruginosa NOT DETECTED NOT DETECTED Final   Stenotrophomonas maltophilia NOT DETECTED NOT DETECTED Final   Candida albicans NOT DETECTED NOT DETECTED Final   Candida auris NOT DETECTED NOT DETECTED Final   Candida glabrata NOT DETECTED NOT DETECTED Final   Candida krusei NOT DETECTED NOT DETECTED Final   Candida parapsilosis NOT DETECTED NOT DETECTED Final   Candida tropicalis NOT DETECTED NOT DETECTED Final   Cryptococcus neoformans/gattii NOT DETECTED NOT DETECTED Final   Vancomycin resistance NOT DETECTED NOT DETECTED Final    Comment: Performed at [facility], [address] [place], [place] [address]  Resp Panel by RT-PCR (Flu A&B, Covid) Nasopharyngeal Swab     Status: None   Collection Time: 11/23/21 11:24 AM   Specimen: Nasopharyngeal Swab; Nasopharyngeal(NP) swabs in vial transport medium  Result Value Ref Range Status   SARS Coronavirus 2 by RT PCR NEGATIVE NEGATIVE Final    Comment: (NOTE) SARS-CoV-2 target nucleic acids are NOT DETECTED.  The SARS-CoV-2 RNA is generally detectable in upper respiratory specimens during the acute phase of infection. The lowest concentration of SARS-CoV-2 viral copies this assay can detect is 138 copies/mL. A negative result does not preclude SARS-Cov-2 infection and should not be used as the sole basis for treatment or other patient management decisions. A negative result may occur with  improper specimen collection/handling, submission of specimen other than nasopharyngeal swab, presence of viral mutation(s) within the areas targeted by this assay, and inadequate number of viral copies(<138 copies/mL). A negative result must be combined with clinical observations, patient history, and epidemiological information. The expected result is Negative.  Fact Sheet for Patients:  EntrepreneurPulse.com.au  Fact Sheet for Healthcare Providers:  IncredibleEmployment.be  This test is no t yet approved or cleared by the [place] FDA and  has been authorized for detection and/or diagnosis of SARS-CoV-2 by FDA under an Emergency Use Authorization (EUA). This EUA will remain  in effect (meaning this test can be used) for the duration of the COVID-19 declaration under Section 564(b)(1) of the Act, 21 U.S.C.section 360bbb-3(b)(1), unless the authorization is terminated  or revoked sooner.       Influenza A by PCR NEGATIVE NEGATIVE Final   Influenza B by PCR NEGATIVE NEGATIVE Final    Comment: (NOTE) The Xpert Xpress SARS-CoV-2/FLU/RSV plus assay is intended as an  aid in the diagnosis of influenza from Nasopharyngeal swab specimens and should not be used as a sole basis for treatment. Nasal washings and aspirates are unacceptable for Xpert Xpress SARS-CoV-2/FLU/RSV testing.  Fact Sheet for Patients: EntrepreneurPulse.com.au  Fact Sheet for Healthcare Providers: IncredibleEmployment.be  This test is not yet approved or cleared by the [place] FDA and has been authorized for detection and/or diagnosis of SARS-CoV-2 by FDA under an Emergency Use Authorization (EUA). This EUA will remain in effect (meaning this test can be used) for the duration of the COVID-19 declaration under Section 564(b)(1) of the Act, 21 U.S.C. section 360bbb-3(b)(1), unless the authorization is terminated or revoked.  Performed at [facility], [address] [place], [place] [address]   Blood culture (routine x 2)     Status: Abnormal   Collection Time: 11/23/21  1:55 PM   Specimen: BLOOD  Result Value Ref Range Status   Specimen Description   Final    BLOOD BLOOD LEFT HAND Performed at [facility], [address], [place], [place] [address]    Special Requests   Final    BOTTLES DRAWN AEROBIC AND ANAEROBIC Blood Culture adequate volume Performed at [facility], [address], [place], [place] [address]    Culture  Setup Time   Final    ANAEROBIC BOTTLE ONLY GRAM POSITIVE COCCI CRITICAL VALUE NOTED.  VALUE IS CONSISTENT WITH PREVIOUSLY REPORTED AND CALLED VALUE. Performed at [facility], [address] [place], [place] [address]    Culture (A)  Final    ENTEROCOCCUS FAECIUM SUSCEPTIBILITIES PERFORMED ON PREVIOUS CULTURE WITHIN THE LAST 5 DAYS. Performed at [facility], [address] [place], [place] [address]    Report  Status 11/26/2021 FINAL  Final  CULTURE, BLOOD (ROUTINE X 2) w Reflex to ID Panel     Status: None (Preliminary result)   Collection Time: 11/26/21   6:39 AM   Specimen: BLOOD  Result Value Ref Range Status   Specimen Description BLOOD LEFT WRIST  Final   Special Requests   Final    BOTTLES DRAWN AEROBIC AND ANAEROBIC Blood Culture adequate volume   Culture   Final    NO GROWTH 4 DAYS Performed at [facility], [address], [place], [place] [address]    Report Status PENDING  Incomplete  CULTURE, BLOOD (ROUTINE X 2) w Reflex to ID Panel     Status: None (Preliminary result)   Collection Time: 11/26/21  6:43 AM   Specimen: BLOOD  Result Value Ref Range Status   Specimen Description BLOOD LEFT HAND  Final   Special Requests   Final    BOTTLES DRAWN AEROBIC AND ANAEROBIC Blood Culture adequate volume   Culture   Final    NO GROWTH 4 DAYS Performed at [facility], [address] [place], [place] [address]    Report Status PENDING  Incomplete         Radiology Studies: US Venous Img Upper Uni Right(DVT)  Result Date: 11/29/2021 CLINICAL DATA:  Right arm swelling EXAM: RIGHT UPPER EXTREMITY VENOUS DOPPLER ULTRASOUND TECHNIQUE: Gray-scale sonography with graded compression, as well as color Doppler and duplex ultrasound were performed to evaluate the upper extremity deep venous system from the level of the subclavian vein and including the jugular, axillary, basilic, radial, ulnar and upper cephalic vein. Spectral Doppler was utilized to evaluate flow at rest and with distal augmentation maneuvers. COMPARISON:  None. FINDINGS: Contralateral Subclavian Vein: Respiratory phasicity is normal and symmetric with the symptomatic side. No evidence of thrombus. Normal compressibility. Internal Jugular Vein: No evidence of thrombus. Normal compressibility, respiratory phasicity and response to augmentation. Subclavian Vein: No evidence of thrombus. Normal compressibility, respiratory phasicity and response to augmentation. Axillary Vein: No evidence of thrombus. Normal compressibility, respiratory phasicity and response  to augmentation. Cephalic Vein: No evidence of thrombus. Normal compressibility, respiratory phasicity and response to augmentation. Basilic Vein: No evidence of thrombus. Normal compressibility, respiratory phasicity and response to augmentation. Brachial Veins: No evidence of thrombus. Normal compressibility, respiratory phasicity and response to augmentation. Radial Veins: No evidence of thrombus. Normal compressibility, respiratory phasicity and response to augmentation. Ulnar Veins: No evidence of thrombus. Normal compressibility, respiratory phasicity and response to augmentation. IMPRESSION: No evidence of DVT within the right upper extremity. Electronically Signed   By: Jerilynn Mages.  Shick M.D.   On: 11/29/2021 13:57   DG HIP UNILAT WITH PELVIS 2-3 VIEWS LEFT  Result Date: 11/28/2021 CLINICAL DATA:  Left hip pain after fall several months ago EXAM: DG HIP (WITH OR WITHOUT PELVIS) 2-3V LEFT COMPARISON:  01/08/2021, 11/23/2021 FINDINGS: Frontal view of the pelvis as well as frontal and frogleg lateral views of the left hip are obtained. Right hip arthroplasty is partially visualized, unchanged in appearance. Penile prosthesis again noted. There is a plate and screw fixation across the proximal left femur again noted. There has been progressive erosive change of the left femoral head and neck, as well as the left acetabulum. 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 There is concern for underlying infection, hip aspiration could be performed. There are no acute displaced fractures. The remainder of the bony pelvis is unremarkable. IMPRESSION:  1. 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 Given the associated left hip effusion seen on prior CT and MRI, aspiration could be performed if septic arthritis is a concern. 2. Stable right hip arthroplasty. 3.  No acute fracture. These results will be called to the ordering clinician or representative by the Radiologist Assistant, and communication documented in the PACS or [employer]. Electronically Signed   By: Randa Ngo M.D.   On: 11/28/2021 16:37        Scheduled Meds:  amLODipine  5 mg Oral Daily   aspirin EC  81 mg Oral Daily   atorvastatin  40 mg Oral QPM   carvedilol  6.25 mg Oral BID   Chlorhexidine Gluconate Cloth  6 each Topical Daily   clopidogrel  75 mg Oral Daily   enoxaparin (LOVENOX) injection  0.5 mg/kg Subcutaneous Q24H   feeding supplement  237 mL Oral TID BM   fluticasone furoate-vilanterol  1 puff Inhalation Daily   And   umeclidinium bromide  1 puff Inhalation Daily   ipratropium  2 spray Each Nare Daily   lidocaine  1 patch Transdermal Q24H   linezolid  600 mg Oral Q12H   lubiprostone  8 mcg Oral BID   oxyCODONE  5 mg Oral Q8H   polyethylene glycol  17 g Oral Daily   QUEtiapine  50 mg Oral QHS   senna-docusate  2 tablet Oral BID   tamsulosin  0.4 mg Oral Daily   Continuous Infusions:   LOS: 7 days    Time spent: 34 mins     Wyvonnia Dusky, MD [facility] Pager [phone]  If 7PM-7AM, please contact night-coverage 11/30/2021, 2:05 PM

## 2021-11-30 NOTE — Consult Note (Signed)
ORTHOPAEDIC CONSULTATION  REQUESTING PHYSICIAN: Wyvonnia Dusky, MD  Chief Complaint:   L hip pain  History of Present Illness: [name] is a 86 y.o. male currently admitted for Enterococcus UTI and bacteremia. He has a history of HTN, HLD, PAD, CAD s/p stents, chronic combined systolic and diastolic CHF, COPD, chronic smoking, chronic alcoholism, sleep apnea bipolar disorder, degenerative joint disease, history of prostate cancer treated with radiation therapy 1994, status post penile prosthesis in 2010 with revision in 2014. He has had a prior R THA by Dr. Mack Guise in 2013.   He has had  Prior ORIF left hip with a blade plate at an unknown time.  Patient states this may have been approximately 10 years ago but cannot recall.  He has developed gradually worsening left hip pain. He ambulates with a walker at baseline.  He generally lives independently in an apartment.    Past Medical History:  Diagnosis Date   Alcoholism ([place])    Asthma    Bipolar affective disorder ([place])    Chronic combined systolic and diastolic CHF (congestive heart failure) ([place])    a. 08/2016 Echo: EF 40-45%, mild AS, mild to mod MR, mildly dil LA/RA, mild-mod TR; b. 07/2017 Echo: EF 40-45%, mod LVH, Gr1 DD, mild to mod AS, mildly dil LA, nl RV fxn; c. 03/2019 Echo: EF 45-50%, AS (not severe). Mod dil PA.   CKD (chronic kidney disease), stage III (HCC)    COPD (chronic obstructive pulmonary disease) (HCC)    Coronary artery disease    a. 01/2004 s/p PCI and Taxus DES to dRCA (3.5 x 12 mm); b. 07/2017 Lexiscan MV: no ischemia. Sm area of apicl thinning, likely attenuation. EF 33% (GI uptake noted)-->Low risk; c. 03/2019 Inf STEMI/PCI: LM nl, LAD min irregs, D1 20ost, RI 20ost, LCX nl, RCA 90p/74m (4.0x26 Resolute Onyx DES), 46m (4.0x15 Resolute Onyx DES), 10d ISR.   Degenerative joint disease    knees and hip   Dyspnea    on exertion   Essential  hypertension    Hyperlipidemia    Hypertension    controlled on meds   Ischemic cardiomyopathy    a. 08/2016 Echo: EF 40-45%; b. 07/2017 Echo: EF 40-45%; c. 03/2019 Echo: EF 45-50%.   OSA (obstructive sleep apnea)    Prostate CA (HCC)    prostate ca dx 20 yrs ago   PVD (peripheral vascular disease) ([place])    Tobacco abuse    Past Surgical History:  Procedure Laterality Date   CARDIAC CATHETERIZATION     CORONARY ANGIOGRAPHY N/A 04/01/2019   Procedure: CORONARY ANGIOGRAPHY;  Surgeon: Nelva Bush, MD;  Location: [facility];  Service: Cardiovascular;  Laterality: N/A;   CORONARY ANGIOPLASTY WITH STENT PLACEMENT     x2   CORONARY/GRAFT ACUTE MI REVASCULARIZATION N/A 04/01/2019   Procedure: Coronary/Graft Acute MI Revascularization;  Surgeon: Nelva Bush, MD;  Location: [facility];  Service: Cardiovascular;  Laterality: N/A;   TOTAL HIP ARTHROPLASTY     right   Social History   Socioeconomic History   Marital status: Legally Separated    Spouse name: Not on file   Number of children: Not on file   Years of education: Not on file   Highest education level: Not on file  Occupational History   Occupation: retired  Tobacco Use   Smoking status: Former    Packs/day: 1.00    Years: 50.00    Pack years: 50.00    Types: Cigarettes  Smokeless tobacco: Never  Vaping Use   Vaping Use: Never used  Substance and Sexual Activity   Alcohol use: Not Currently   Drug use: No   Sexual activity: Never  Other Topics Concern   Not on file  Social History Narrative   Not on file   Social Determinants of Health   Financial Resource Strain: Not on file  Food Insecurity: Not on file  Transportation Needs: Not on file  Physical Activity: Not on file  Stress: Not on file  Social Connections: Not on file   Family History  Problem Relation Age of Onset   Hypertension Mother    Hyperlipidemia Mother    Heart attack Mother    Hypertension Father     Hyperlipidemia Father    Heart attack Father    Prostate cancer Neg Hx    Bladder Cancer Neg Hx    Kidney cancer Neg Hx    Allergies  Allergen Reactions   Chlorthalidone Other (See Comments)    Hyponatremia   Ace Inhibitors Cough   Hydrochlorothiazide    Lisinopril Other (See Comments)   Losartan    Other Other (See Comments)    "ANY BLOOD PRESSURE MEDICATIONS THAT I'VE TRIED" - PT. DOES NOT REMEMBER WHICH ONES "ANY BLOOD PRESSURE MEDICATIONS THAT I'VE TRIED"- PT. DOES NOT REMEMBER WHICH ONES- CONSTIPATION   Benazepril-Hydrochlorothiazide Other (See Comments)    Constipation   Prior to Admission medications   Medication Sig Start Date End Date Taking? Authorizing Provider  ADULT ASPIRIN REGIMEN 81 MG EC tablet TAKE 1 TABLET BY MOUTH DAILY 03/08/20  Yes [name], MD  atorvastatin (LIPITOR) 40 MG tablet Take 40 mg by mouth daily.   Yes [provider]  carvedilol (COREG) 6.25 MG tablet TAKE 1 TABLET BY MOUTH TWICE A DAY Patient taking differently: Take 6.25 mg by mouth 2 (two) times daily with a meal. 08/15/21  Yes [name], MD  clopidogrel (PLAVIX) 75 MG tablet Take 1 tablet (75 mg total) by mouth daily. 10/28/19  Yes [name], MD  furosemide (LASIX) 40 MG tablet TAKE 1 TABLET BY MOUTH DAILY AND TAKE ANADDITIONAL TABLET IN THE AFTERNOON AS NEEDED FOR WEIGHT GAIN AND LEG EDEMA 04/21/21  Yes [name], MD  ipratropium (ATROVENT) 0.03 % nasal spray Place 2 sprays into both nostrils daily. 05/07/20  Yes [provider]  isosorbide mononitrate (IMDUR) 30 MG 24 hr tablet Take 30 mg by mouth daily. 10/26/21  Yes [provider]  lubiprostone (AMITIZA) 8 MCG capsule Take 8 mcg by mouth 2 (two) times daily. 08/24/21  Yes [provider]  oxyCODONE (OXY IR/ROXICODONE) 5 MG immediate release tablet Take 1 tablet (5 mg total) by mouth every 8 (eight) hours. Must last 30 days. 11/03/21 12/03/21 Yes Milinda Pointer, MD  QUEtiapine  (SEROQUEL) 50 MG tablet Take 50 mg by mouth at bedtime.   Yes [provider]  tamsulosin (FLOMAX) 0.4 MG CAPS capsule Take 0.4 mg by mouth daily.   Yes [provider]  albuterol (VENTOLIN HFA) 108 (90 Base) MCG/ACT inhaler USE 2 PUFFS EVERY 4 HOURS AS NEEDED 10/14/19   Darylene Price A, FNP  azelastine (ASTELIN) 0.1 % nasal spray Place 1 spray into both nostrils daily as needed for allergies. Patient not taking: Reported on 11/23/2021    [provider]  calcium carbonate (OSCAL) 1500 (600 Ca) MG TABS tablet Take 1 tablet (1,500 mg total) by mouth 2 (two) times daily with a meal. Patient  not taking: Reported on 08/26/2021 11/29/20 06/06/21  Milinda Pointer, MD  feeding supplement (ENSURE ENLIVE / ENSURE PLUS) LIQD Take 237 mLs by mouth 3 (three) times daily between meals. 01/13/21   Lorella Nimrod, MD  fluticasone (FLONASE) 50 MCG/ACT nasal spray Place 2 sprays into both nostrils daily. Patient not taking: Reported on 11/23/2021    [provider]  Magnesium Oxide 500 MG CAPS Take 1 capsule (500 mg total) by mouth daily. Patient not taking: Reported on 08/26/2021 11/29/20 02/27/21  Milinda Pointer, MD  nitroGLYCERIN (NITROSTAT) 0.4 MG SL tablet Take 0.4 mg by mouth every 5 (five) minutes x 3 doses as needed for chest pain. As needed for chest pain 12/06/15   [provider]  oxyCODONE (OXY IR/ROXICODONE) 5 MG immediate release tablet Take 1 tablet (5 mg total) by mouth every 8 (eight) hours. Must last 30 days. 12/03/21 01/02/22  Milinda Pointer, MD  oxyCODONE (OXY IR/ROXICODONE) 5 MG immediate release tablet Take 1 tablet (5 mg total) by mouth every 8 (eight) hours. Must last 30 days. 01/02/22 02/01/22  Milinda Pointer, MD  TRELEGY ELLIPTA 200-62.5-25 MCG/INH AEPB Inhale 1 puff into the lungs daily.  04/08/20   [provider]  Cholecalciferol (VITAMIN D3) 125 MCG (5000 UT) CAPS Take 1 capsule (5,000 Units total) by mouth daily with breakfast. Take  along with calcium and magnesium. Patient not taking: Reported on 01/27/2021 11/29/20 01/28/21  Milinda Pointer, MD   Recent Labs    11/28/21 [phone] 11/29/21 0643  WBC  --  6.5  HGB  --  8.4*  HCT  --  24.9*  PLT  --  183  CREATININE 1.90* 1.84*   US Venous Img Upper Uni Right(DVT)  Result Date: 11/29/2021 CLINICAL DATA:  Right arm swelling EXAM: RIGHT UPPER EXTREMITY VENOUS DOPPLER ULTRASOUND TECHNIQUE: Gray-scale sonography with graded compression, as well as color Doppler and duplex ultrasound were performed to evaluate the upper extremity deep venous system from the level of the subclavian vein and including the jugular, axillary, basilic, radial, ulnar and upper cephalic vein. Spectral Doppler was utilized to evaluate flow at rest and with distal augmentation maneuvers. COMPARISON:  None. FINDINGS: Contralateral Subclavian Vein: Respiratory phasicity is normal and symmetric with the symptomatic side. No evidence of thrombus. Normal compressibility. Internal Jugular Vein: No evidence of thrombus. Normal compressibility, respiratory phasicity and response to augmentation. Subclavian Vein: No evidence of thrombus. Normal compressibility, respiratory phasicity and response to augmentation. Axillary Vein: No evidence of thrombus. Normal compressibility, respiratory phasicity and response to augmentation. Cephalic Vein: No evidence of thrombus. Normal compressibility, respiratory phasicity and response to augmentation. Basilic Vein: No evidence of thrombus. Normal compressibility, respiratory phasicity and response to augmentation. Brachial Veins: No evidence of thrombus. Normal compressibility, respiratory phasicity and response to augmentation. Radial Veins: No evidence of thrombus. Normal compressibility, respiratory phasicity and response to augmentation. Ulnar Veins: No evidence of thrombus. Normal compressibility, respiratory phasicity and response to augmentation. IMPRESSION: No evidence of DVT  within the right upper extremity. Electronically Signed   By: Jerilynn Mages.  Shick M.D.   On: 11/29/2021 13:57   DG HIP UNILAT WITH PELVIS 2-3 VIEWS LEFT  Result Date: 11/28/2021 CLINICAL DATA:  Left hip pain after fall several months ago EXAM: DG HIP (WITH OR WITHOUT PELVIS) 2-3V LEFT COMPARISON:  01/08/2021, 11/23/2021 FINDINGS: Frontal view of the pelvis as well as frontal and frogleg lateral views of the left hip are obtained. Right hip arthroplasty is partially visualized, unchanged in appearance. Penile prosthesis again noted. There  is a plate and screw fixation across the proximal left femur again noted. There has been progressive erosive change of the left femoral head and neck, as well as the left acetabulum. 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 There is concern for underlying infection, hip aspiration could be performed. There are no acute displaced fractures. The remainder of the bony pelvis is unremarkable. IMPRESSION: 1. 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 Given the associated left hip effusion seen on prior CT and MRI, aspiration could be performed if septic arthritis is a concern. 2. Stable right hip arthroplasty. 3. No acute fracture. These results will be called to the ordering clinician or representative by the Radiologist Assistant, and communication documented in the PACS or [employer]. Electronically Signed   By: Randa Ngo M.D.   On: 11/28/2021 16:37     Positive ROS: All other systems have been reviewed and were otherwise negative with the exception of those mentioned in the HPI and as above.  Physical Exam: BP 139/66 (BP Location: Left Arm)    Pulse 69    Temp 99.3 F (37.4 C) (Oral)    Resp 20    Ht 5\' 7"  (1.702 m)    Wt 114.5 kg    SpO2 97%    BMI 39.54 kg/m  General:  Alert, no acute  distress Psychiatric:  Patient is competent for consent with normal mood and affect   Cardiovascular:  No pedal edema, regular rate and rhythm Respiratory:  No wheezing, non-labored breathing GI:  Abdomen is soft and non-tender Skin:  No lesions in the area of chief complaint, no erythema Neurologic:  Sensation intact distally, CN grossly intact Lymphatic:  No axillary or cervical lymphadenopathy  Orthopedic Exam:  LLE: 5/5 DF/PF/EHL SILT s/s/t/sp/dp distr Foot wwp Able to perform straight leg raise. Able to passive flex hip to ~60 degrees with 10 IR, 30 ER with minimal pain. Has increased pain at end range of motion   X-rays:  As above: Right hip arthroplasty in place. L hip s/p ORIF with blade plate with hardware failure and erosive changes to the hip joint. Femoral head is almost completely resorbed and wear of the acetabulum is present as well.   Assessment/Plan: 86 yo M w/erosive changes to the R hip joint s/p ORIF with blade plate. Clinical exam is not concerning for septic hip.  We discussed the clinical and radiographic findings. Nonsurgical management would consist of medical management and possible corticosteroid injection to the hip joint. Surgical intervention would likely consist of hardware removal and possible conversion to total hip arthroplasty if there was enough acetabular bone stock. We discussed that he would be at very high risk of complications given his extensive medical co-morbidities He may WBAT on LLE.  Recommend PT/OT He can follow up as an outpatient with Dr. Rudene Christians who has agreed to see the patient to discuss options and surgical risks/benefits.  Will follow peripherally. Please page with any questions.      Leim Fabry   11/30/2021 8:14 AM

## 2021-11-30 NOTE — TOC Progression Note (Signed)
Transition of Care [facility]) - Progression Note    Patient Details  Name: [name] MRN: [id number] Date of Birth: 11-11-1935  Transition of Care [facility]) CM/SW [place], RN Phone Number: 11/30/2021, 2:07 PM  Clinical Narrative:   Attempted to contact [employer] 12/13 and 12/14.  Contacted Connie today. Authorization for [facility] in progress.    Expected Discharge Plan: Skilled Nursing Facility Barriers to Discharge: Continued Medical Work up  Expected Discharge Plan and Services Expected Discharge Plan: [place]       Living arrangements for the past 2 months: Single Family Home                                       Social Determinants of Health (SDOH) Interventions    Readmission Risk Interventions Readmission Risk Prevention Plan 11/27/2021 02/17/2021  Transportation Screening Complete Complete  PCP or Specialist Appt within 3-5 Days - (No Data)  [place] or [place] - (No Data)  Social Work Consult for [place] Planning/Counseling - Complete  Medication Review Press photographer) Complete -  PCP or Specialist appointment within 3-5 days of discharge Complete -  [place] or [place] Complete -  SW Recovery Care/Counseling Consult Complete -  Palliative Care Screening Not Applicable -  [place] Complete -  Some recent data might be hidden

## 2021-12-01 LAB — CBC
HCT: 23.8 % — ABNORMAL LOW (ref 39.0–52.0)
Hemoglobin: 7.8 g/dL — ABNORMAL LOW (ref 13.0–17.0)
MCH: 26.8 pg (ref 26.0–34.0)
MCHC: 32.8 g/dL (ref 30.0–36.0)
MCV: 81.8 fL (ref 80.0–100.0)
Platelets: 225 10*3/uL (ref 150–400)
RBC: 2.91 MIL/uL — ABNORMAL LOW (ref 4.22–5.81)
RDW: 15.7 % — ABNORMAL HIGH (ref 11.5–15.5)
WBC: 7.7 10*3/uL (ref 4.0–10.5)
nRBC: 0 % (ref 0.0–0.2)

## 2021-12-01 LAB — CULTURE, BLOOD (ROUTINE X 2)
Culture: NO GROWTH
Culture: NO GROWTH
Special Requests: ADEQUATE
Special Requests: ADEQUATE

## 2021-12-01 LAB — BASIC METABOLIC PANEL
Anion gap: 7 (ref 5–15)
BUN: 27 mg/dL — ABNORMAL HIGH (ref 8–23)
CO2: 23 mmol/L (ref 22–32)
Calcium: 7.7 mg/dL — ABNORMAL LOW (ref 8.9–10.3)
Chloride: 107 mmol/L (ref 98–111)
Creatinine, Ser: 2.18 mg/dL — ABNORMAL HIGH (ref 0.61–1.24)
GFR, Estimated: 29 mL/min — ABNORMAL LOW (ref >60)
Glucose, Bld: 80 mg/dL (ref 70–99)
Potassium: 4.7 mmol/L (ref 3.5–5.1)
Sodium: 137 mmol/L (ref 135–145)

## 2021-12-01 MED ORDER — OXYCODONE HCL 5 MG PO TABS
10.0000 mg | ORAL_TABLET | Freq: Three times a day (TID) | ORAL | Status: DC
  Administered 2021-12-01 – 2021-12-05 (×13): 10 mg via ORAL
  Filled 2021-12-01 (×13): qty 2

## 2021-12-01 MED ORDER — LACTULOSE 10 GM/15ML PO SOLN
20.0000 g | Freq: Two times a day (BID) | ORAL | Status: DC
  Administered 2021-12-02 – 2021-12-04 (×4): 20 g via ORAL
  Filled 2021-12-01 (×5): qty 30

## 2021-12-01 MED ORDER — MORPHINE SULFATE (PF) 2 MG/ML IV SOLN
1.0000 mg | INTRAVENOUS | Status: DC | PRN
  Administered 2021-12-01 – 2021-12-03 (×5): 1 mg via INTRAVENOUS
  Filled 2021-12-01 (×5): qty 1

## 2021-12-01 NOTE — Care Management Important Message (Signed)
Important Message  Patient Details  Name: [name] MRN: [id number] Date of Birth: 1935-08-27   Medicare Important Message Given:  Yes  The physician and RN CM has been discussing discharge disposition with the patient daughter's, [name] due to dementia. I reviewed the Important Message from Medicare with his daughter by phone [phone]) and she is in agreement with the discharge plan. I asked if she would like me to send a copy and she replied yes. I have sent a copy via secure e-mail to [email or web address] per her request and thanked her for her time.    Juliann Pulse A Mccormick [place] 12/01/2021, 2:24 PM

## 2021-12-01 NOTE — TOC Progression Note (Addendum)
Transition of Care [facility]) - Progression Note    Patient Details  Name: [name] MRN: [id number] Date of Birth: 04/28/1935  Transition of Care [facility]) CM/SW [place], RN Phone Number: 12/01/2021, 1:23 PM  Clinical Narrative:    Call to [facility] @ [facility]-SNF request remains under review. Updated patient will need EMS transport if approved. Patient is eager to discharge.    Expected Discharge Plan: Skilled Nursing Facility Barriers to Discharge: Continued Medical Work up  Expected Discharge Plan and Services Expected Discharge Plan: [place]       Living arrangements for the past 2 months: Single Family Home                                       Social Determinants of Health (SDOH) Interventions    Readmission Risk Interventions Readmission Risk Prevention Plan 11/27/2021 02/17/2021  Transportation Screening Complete Complete  PCP or Specialist Appt within 3-5 Days - (No Data)  [place] or [place] - (No Data)  Social Work Consult for [place] Planning/Counseling - Complete  Medication Review Press photographer) Complete -  PCP or Specialist appointment within 3-5 days of discharge Complete -  [place] or [place] Complete -  SW Recovery Care/Counseling Consult Complete -  Palliative Care Screening Not Applicable -  [place] Complete -  Some recent data might be hidden

## 2021-12-01 NOTE — Progress Notes (Signed)
MD notified of diastolic pressures consistently in 40s and low 50s with coreg and norvasc both scheduled for AM per MD as long as the pt's MAP >65 and asymptomatic than BP is ok.

## 2021-12-01 NOTE — TOC Progression Note (Signed)
Transition of Care [facility]) - Progression Note    Patient Details  Name: [name] MRN: [id number] Date of Birth: 1934/12/19  Transition of Care [facility]) CM/SW [place], RN Phone Number: 12/01/2021, 2:37 PM  Clinical Narrative:   [employer] auth begun yesterday, RNCM still waiting for auth to send patient to [facility] in [place], [place] (family SNF choice).  Call in to [facility] at [place] will notify RNCM when auth decision is final.  TOC to follow.    Expected Discharge Plan: Skilled Nursing Facility Barriers to Discharge: Continued Medical Work up  Expected Discharge Plan and Services Expected Discharge Plan: [place]       Living arrangements for the past 2 months: Single Family Home                                       Social Determinants of Health (SDOH) Interventions    Readmission Risk Interventions Readmission Risk Prevention Plan 11/27/2021 02/17/2021  Transportation Screening Complete Complete  PCP or Specialist Appt within 3-5 Days - (No Data)  [place] or [place] - (No Data)  Social Work Consult for [place] Planning/Counseling - Complete  Medication Review Press photographer) Complete -  PCP or Specialist appointment within 3-5 days of discharge Complete -  [place] or [place] Complete -  SW Recovery Care/Counseling Consult Complete -  Palliative Care Screening Not Applicable -  [place] Complete -  Some recent data might be hidden

## 2021-12-01 NOTE — Progress Notes (Signed)
PROGRESS NOTE    [name]  TTS:[id number] DOB: 04-23-1935 DOA: 11/23/2021 PCP: Pcp, No  Assessment & Plan:   Principal Problem:   Sepsis secondary to UTI [facility]) Active Problems:   CAD (coronary artery disease), native coronary artery   Essential hypertension   COPD (chronic obstructive pulmonary disease) (HCC)   Anemia in stage 3b chronic kidney disease ([place])   Hypothyroidism   Type 2 diabetes mellitus with diabetic chronic kidney disease ([place])   Pressure injury of skin   Sepsis: secondary to UTI & bacteremia. Continue on linezolid x 2 weeks as per ID   Bacteremia: blood cxs growing enterococcus faecium. Likely secondary to UTI. Continue on linezolid x 2 weeks as per ID   UTI: likely traumatic catherization contributing to it.  Urine cx growing enterococcus faecium. Continue on linezolid x  weeks as per ID. Has penile prosthesis was closely to rule out any hardware infection   Acute urinary retention: w/ hx of BPH. Apparently pt tried to relieve the 'obstruction' by inserting a metal rod through the urethra.  After that he started having hematuria and severe suprapubic pain.  CT renal no acute issues in abdomen and pelvis. Continue on foley x 7 days as per urology (foley placed on 11/23/21). Office follow-up next week for cystoscopy, if still here may want to touch base with urology about seeing the patient.  Dementia: as per pt's daughter. Continue on seroquel & w/ supportive care     AKI on CKD stage IIIb: Cr is labile. Avoid nephrotoxic meds   Hyponatremia: resolved   Likely ACD: likely secondary to CKD. 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  Needs removal of hardware and conversion to total hip arthroplasty but surgery would likely need to be done as an outpatient as per ortho surg    RUE swelling: [place] of RUE was neg for DVT     CAD: continue  on plavix, statin & aspirin   Hx of chronic combined CHF: echo from 2020 with EF 45 to 50%, severely increased LV wall thickness. Appears euvolemic. Monitor I/Os   COPD: w/o exacerbation. Continue on bronchodilators    Bipolar disorder: continue on seroquel    DVT prophylaxis: lovenox  Code Status: DNR Family Communication: discussed pt's care w/ pt's daughter, [name], and answered her questions  Disposition Plan: likely d/c to SNF  Level of care: Telemetry Medical  Status is: Inpatient  Remains inpatient appropriate because: medically stable but waiting on insurance auth    Consultants:  Ortho surg   Procedures:   Antimicrobials: linezolid    Subjective: Pt is agitated   Objective: Vitals:   11/30/21 1958 12/01/21 0026 12/01/21 0449 12/01/21 0743  BP: 133/60 (!) 126/48 (!) 135/50 (!) 138/53  Pulse: 68 67 62 64  Resp: 19 16 19 16   Temp: 98.2 F (36.8 C) 98.6 F (37 C) 98.3 F (36.8 C) 98.7 F (37.1 C)  TempSrc:    Oral  SpO2: 97% 98% 97% 97%  Weight:      Height:        Intake/Output Summary (Last 24 hours) at 12/01/2021 0753 Last data filed at 12/01/2021 0451 Gross per 24 hour  Intake 360 ml  Output 650 ml  Net -290 ml   Filed Weights   11/23/21 0430 11/24/21 0500  Weight: 112.5 kg 114.5 kg    Examination:  General  exam: Appears frustrated and agitated  Respiratory system: diminished breath sounds b/l Cardiovascular system: S1/S2+. No clicks or rubs  Gastrointestinal system: Abd is soft, NT, obese & hypoactive bowel sounds  Central nervous system: Alert and awake. Moves all extremities  Psychiatry: Judgement and insight appears poor. Agitated and frustrated     Data Reviewed: I have personally reviewed following labs and imaging studies  CBC: Recent Labs  Lab 11/25/21 0340 11/26/21 0633 11/29/21 0643 11/30/21 0909 12/01/21 0513  WBC 16.0* 10.7* 6.5 6.8 7.7  NEUTROABS 12.5* 8.3*  --   --   --   HGB 8.5* 8.4* 8.4* 7.6* 7.8*  HCT 25.7*  25.8* 24.9* 23.3* 23.8*  MCV 82.4 82.7 79.6* 80.9 81.8  PLT 209 186 183 187 096   Basic Metabolic Panel: Recent Labs  Lab 11/25/21 0340 11/26/21 0633 11/27/21 0652 11/28/21 0353 11/29/21 0643 11/30/21 0909 12/01/21 0513  NA 133* 136  --   --   --  132* 137  K 4.1 4.6  --   --   --  4.3 4.7  CL 100 103  --   --   --  107 107  CO2 24 25  --   --   --  20* 23  GLUCOSE 94 83  --   --   --  83 80  BUN 36* 37*  --   --   --  28* 27*  CREATININE 2.63* 2.53* 2.13* 1.90* 1.84* 2.08* 2.18*  CALCIUM 7.9* 8.0*  --   --   --  7.5* 7.7*   GFR: Estimated Creatinine Clearance: 30.5 mL/min (A) (by C-G formula based on SCr of 2.18 mg/dL (H)). Liver Function Tests: No results for input(s): AST, ALT, ALKPHOS, BILITOT, PROT, ALBUMIN in the last 168 hours. No results for input(s): LIPASE, AMYLASE in the last 168 hours. No results for input(s): AMMONIA in the last 168 hours. Coagulation Profile: No results for input(s): INR, PROTIME in the last 168 hours.  Cardiac Enzymes: No results for input(s): CKTOTAL, CKMB, CKMBINDEX, TROPONINI in the last 168 hours. BNP (last 3 results) No results for input(s): PROBNP in the last 8760 hours. HbA1C: No results for input(s): HGBA1C in the last 72 hours. CBG: No results for input(s): GLUCAP in the last 168 hours. Lipid Profile: No results for input(s): CHOL, HDL, LDLCALC, TRIG, CHOLHDL, LDLDIRECT in the last 72 hours. Thyroid Function Tests: No results for input(s): TSH, T4TOTAL, FREET4, T3FREE, THYROIDAB in the last 72 hours. Anemia Panel: No results for input(s): VITAMINB12, FOLATE, FERRITIN, TIBC, IRON, RETICCTPCT in the last 72 hours. Sepsis Labs: Recent Labs  Lab 11/26/21 0454  LATICACIDVEN 1.2    Recent Results (from the past 240 hour(s))  Urine Culture     Status: Abnormal   Collection Time: 11/23/21  7:41 AM   Specimen: Urine, Catheterized  Result Value Ref Range Status   Specimen Description   Final    URINE, CATHETERIZED Performed at  [facility], [address], [place], [place] [address]    Special Requests   Final    NONE Performed at [facility], [address] [place], [place] [address]    Culture >=100,000 COLONIES/mL ENTEROCOCCUS FAECIUM (A)  Final   Report Status 11/27/2021 FINAL  Final   Organism ID, Bacteria ENTEROCOCCUS FAECIUM (A)  Final      Susceptibility   Enterococcus faecium - MIC*    AMPICILLIN RESISTANT Resistant     NITROFURANTOIN <=16 SENSITIVE Sensitive     VANCOMYCIN <=  0.5 SENSITIVE Sensitive     * >=100,000 COLONIES/mL ENTEROCOCCUS FAECIUM  Blood culture (routine x 2)     Status: Abnormal   Collection Time: 11/23/21  9:55 AM   Specimen: BLOOD  Result Value Ref Range Status   Specimen Description   Final    BLOOD RIGHT FA Performed at [facility], [address], [place], [place] [address]    Special Requests   Final    BOTTLES DRAWN AEROBIC AND ANAEROBIC Blood Culture adequate volume Performed at [facility], [address] [place], [place] [address]    Culture  Setup Time   Final    Organism ID to follow GRAM POSITIVE COCCI IN BOTH AEROBIC AND ANAEROBIC BOTTLES CRITICAL RESULT CALLED TO, READ BACK BY AND VERIFIED WITH: Lu Duffel 11/24/21 0857 MW Performed at [facility], [address] [place], [place] [address]    Culture ENTEROCOCCUS FAECIUM (A)  Final   Report Status 11/26/2021 FINAL  Final   Organism ID, Bacteria ENTEROCOCCUS FAECIUM  Final      Susceptibility   Enterococcus faecium - MIC*    AMPICILLIN 4 SENSITIVE Sensitive     VANCOMYCIN <=0.5 SENSITIVE Sensitive     GENTAMICIN SYNERGY SENSITIVE Sensitive     * ENTEROCOCCUS FAECIUM  Blood Culture ID Panel (Reflexed)     Status: Abnormal   Collection Time: 11/23/21  9:55 AM  Result Value Ref Range Status   Enterococcus faecalis NOT DETECTED NOT DETECTED Final   Enterococcus Faecium DETECTED (A) NOT DETECTED Final    Comment: RESULT CALLED TO, READ BACK BY AND  VERIFIED WITH: [facility] MITCHELL 11/24/21 0857 MW    Listeria monocytogenes NOT DETECTED NOT DETECTED Final   Staphylococcus species NOT DETECTED NOT DETECTED Final   Staphylococcus aureus (BCID) NOT DETECTED NOT DETECTED Final   Staphylococcus epidermidis NOT DETECTED NOT DETECTED Final   Staphylococcus lugdunensis NOT DETECTED NOT DETECTED Final   Streptococcus species NOT DETECTED NOT DETECTED Final   Streptococcus agalactiae NOT DETECTED NOT DETECTED Final   Streptococcus pneumoniae NOT DETECTED NOT DETECTED Final   Streptococcus pyogenes NOT DETECTED NOT DETECTED Final   A.calcoaceticus-baumannii NOT DETECTED NOT DETECTED Final   Bacteroides fragilis NOT DETECTED NOT DETECTED Final   Enterobacterales NOT DETECTED NOT DETECTED Final   Enterobacter cloacae complex NOT DETECTED NOT DETECTED Final   Escherichia coli NOT DETECTED NOT DETECTED Final   Klebsiella aerogenes NOT DETECTED NOT DETECTED Final   Klebsiella oxytoca NOT DETECTED NOT DETECTED Final   Klebsiella pneumoniae NOT DETECTED NOT DETECTED Final   Proteus species NOT DETECTED NOT DETECTED Final   Salmonella species NOT DETECTED NOT DETECTED Final   Serratia marcescens NOT DETECTED NOT DETECTED Final   Haemophilus influenzae NOT DETECTED NOT DETECTED Final   Neisseria meningitidis NOT DETECTED NOT DETECTED Final   Pseudomonas aeruginosa NOT DETECTED NOT DETECTED Final   Stenotrophomonas maltophilia NOT DETECTED NOT DETECTED Final   Candida albicans NOT DETECTED NOT DETECTED Final   Candida auris NOT DETECTED NOT DETECTED Final   Candida glabrata NOT DETECTED NOT DETECTED Final   Candida krusei NOT DETECTED NOT DETECTED Final   Candida parapsilosis NOT DETECTED NOT DETECTED Final   Candida tropicalis NOT DETECTED NOT DETECTED Final   Cryptococcus neoformans/gattii NOT DETECTED NOT DETECTED Final   Vancomycin resistance NOT DETECTED NOT DETECTED Final    Comment: Performed at [facility], [address]  [place], [place] [address]  Resp Panel by RT-PCR (Flu A&B, Covid) Nasopharyngeal Swab  Status: None   Collection Time: 11/23/21 11:24 AM   Specimen: Nasopharyngeal Swab; Nasopharyngeal(NP) swabs in vial transport medium  Result Value Ref Range Status   SARS Coronavirus 2 by RT PCR NEGATIVE NEGATIVE Final    Comment: (NOTE) SARS-CoV-2 target nucleic acids are NOT DETECTED.  The SARS-CoV-2 RNA is generally detectable in upper respiratory specimens during the acute phase of infection. The lowest concentration of SARS-CoV-2 viral copies this assay can detect is 138 copies/mL. A negative result does not preclude SARS-Cov-2 infection and should not be used as the sole basis for treatment or other patient management decisions. A negative result may occur with  improper specimen collection/handling, submission of specimen other than nasopharyngeal swab, presence of viral mutation(s) within the areas targeted by this assay, and inadequate number of viral copies(<138 copies/mL). A negative result must be combined with clinical observations, patient history, and epidemiological information. The expected result is Negative.  Fact Sheet for Patients:  EntrepreneurPulse.com.au  Fact Sheet for Healthcare Providers:  IncredibleEmployment.be  This test is no t yet approved or cleared by the [place] FDA and  has been authorized for detection and/or diagnosis of SARS-CoV-2 by FDA under an Emergency Use Authorization (EUA). This EUA will remain  in effect (meaning this test can be used) for the duration of the COVID-19 declaration under Section 564(b)(1) of the Act, 21 U.S.C.section 360bbb-3(b)(1), unless the authorization is terminated  or revoked sooner.       Influenza A by PCR NEGATIVE NEGATIVE Final   Influenza B by PCR NEGATIVE NEGATIVE Final    Comment: (NOTE) The Xpert Xpress SARS-CoV-2/FLU/RSV plus assay is intended as an aid in the diagnosis of  influenza from Nasopharyngeal swab specimens and should not be used as a sole basis for treatment. Nasal washings and aspirates are unacceptable for Xpert Xpress SARS-CoV-2/FLU/RSV testing.  Fact Sheet for Patients: EntrepreneurPulse.com.au  Fact Sheet for Healthcare Providers: IncredibleEmployment.be  This test is not yet approved or cleared by the [place] FDA and has been authorized for detection and/or diagnosis of SARS-CoV-2 by FDA under an Emergency Use Authorization (EUA). This EUA will remain in effect (meaning this test can be used) for the duration of the COVID-19 declaration under Section 564(b)(1) of the Act, 21 U.S.C. section 360bbb-3(b)(1), unless the authorization is terminated or revoked.  Performed at [facility], [address] [place], [place] [address]   Blood culture (routine x 2)     Status: Abnormal   Collection Time: 11/23/21  1:55 PM   Specimen: BLOOD  Result Value Ref Range Status   Specimen Description   Final    BLOOD BLOOD LEFT HAND Performed at [facility], [address], [place], [place] [address]    Special Requests   Final    BOTTLES DRAWN AEROBIC AND ANAEROBIC Blood Culture adequate volume Performed at [facility], [address] [place], [place] [address]    Culture  Setup Time   Final    ANAEROBIC BOTTLE ONLY GRAM POSITIVE COCCI CRITICAL VALUE NOTED.  VALUE IS CONSISTENT WITH PREVIOUSLY REPORTED AND CALLED VALUE. Performed at [facility], [address], [place], [place] [address]    Culture (A)  Final    ENTEROCOCCUS FAECIUM SUSCEPTIBILITIES PERFORMED ON PREVIOUS CULTURE WITHIN THE LAST 5 DAYS. Performed at [facility], [place] [address], [place],  [address]    Report Status 11/26/2021 FINAL  Final  CULTURE, BLOOD (ROUTINE X 2) w Reflex to ID Panel  Status: None   Collection Time: 11/26/21  6:39 AM   Specimen: BLOOD  Result Value  Ref Range Status   Specimen Description BLOOD LEFT WRIST  Final   Special Requests   Final    BOTTLES DRAWN AEROBIC AND ANAEROBIC Blood Culture adequate volume   Culture   Final    NO GROWTH 5 DAYS Performed at [facility], [address] [place], [place] [address]    Report Status 12/01/2021 FINAL  Final  CULTURE, BLOOD (ROUTINE X 2) w Reflex to ID Panel     Status: None   Collection Time: 11/26/21  6:43 AM   Specimen: BLOOD  Result Value Ref Range Status   Specimen Description BLOOD LEFT HAND  Final   Special Requests   Final    BOTTLES DRAWN AEROBIC AND ANAEROBIC Blood Culture adequate volume   Culture   Final    NO GROWTH 5 DAYS Performed at [facility], [address], [place], [place] [address]    Report Status 12/01/2021 FINAL  Final         Radiology Studies: US Venous Img Upper Uni Right(DVT)  Result Date: 11/29/2021 CLINICAL DATA:  Right arm swelling EXAM: RIGHT UPPER EXTREMITY VENOUS DOPPLER ULTRASOUND TECHNIQUE: Gray-scale sonography with graded compression, as well as color Doppler and duplex ultrasound were performed to evaluate the upper extremity deep venous system from the level of the subclavian vein and including the jugular, axillary, basilic, radial, ulnar and upper cephalic vein. Spectral Doppler was utilized to evaluate flow at rest and with distal augmentation maneuvers. COMPARISON:  None. FINDINGS: Contralateral Subclavian Vein: Respiratory phasicity is normal and symmetric with the symptomatic side. No evidence of thrombus. Normal compressibility. Internal Jugular Vein: No evidence of thrombus. Normal compressibility, respiratory phasicity and response to augmentation. Subclavian Vein: No evidence of thrombus. Normal compressibility, respiratory phasicity and response to augmentation. Axillary Vein: No evidence of thrombus. Normal compressibility, respiratory phasicity and response to augmentation. Cephalic Vein: No evidence of thrombus.  Normal compressibility, respiratory phasicity and response to augmentation. Basilic Vein: No evidence of thrombus. Normal compressibility, respiratory phasicity and response to augmentation. Brachial Veins: No evidence of thrombus. Normal compressibility, respiratory phasicity and response to augmentation. Radial Veins: No evidence of thrombus. Normal compressibility, respiratory phasicity and response to augmentation. Ulnar Veins: No evidence of thrombus. Normal compressibility, respiratory phasicity and response to augmentation. IMPRESSION: No evidence of DVT within the right upper extremity. Electronically Signed   By: Jerilynn Mages.  Shick M.D.   On: 11/29/2021 13:57        Scheduled Meds:  amLODipine  5 mg Oral Daily   aspirin EC  81 mg Oral Daily   atorvastatin  40 mg Oral QPM   carvedilol  6.25 mg Oral BID   Chlorhexidine Gluconate Cloth  6 each Topical Daily   clopidogrel  75 mg Oral Daily   enoxaparin (LOVENOX) injection  0.5 mg/kg Subcutaneous Q24H   feeding supplement  237 mL Oral TID BM   fluticasone furoate-vilanterol  1 puff Inhalation Daily   And   umeclidinium bromide  1 puff Inhalation Daily   ipratropium  2 spray Each Nare Daily   lidocaine  1 patch Transdermal Q24H   linezolid  600 mg Oral Q12H   lubiprostone  8 mcg Oral BID   oxyCODONE  5 mg Oral Q8H   polyethylene glycol  17 g Oral Daily   QUEtiapine  50 mg Oral QHS   senna-docusate  2 tablet Oral BID   tamsulosin  0.4 mg Oral Daily   Continuous Infusions:   LOS: 8 days    Time spent: 20 mins     Wyvonnia Dusky, MD [facility] Pager [phone]  If 7PM-7AM, please contact night-coverage 12/01/2021, 7:53 AM

## 2021-12-02 LAB — BASIC METABOLIC PANEL
Anion gap: 6 (ref 5–15)
BUN: 25 mg/dL — ABNORMAL HIGH (ref 8–23)
CO2: 21 mmol/L — ABNORMAL LOW (ref 22–32)
Calcium: 7.8 mg/dL — ABNORMAL LOW (ref 8.9–10.3)
Chloride: 107 mmol/L (ref 98–111)
Creatinine, Ser: 2.16 mg/dL — ABNORMAL HIGH (ref 0.61–1.24)
GFR, Estimated: 29 mL/min — ABNORMAL LOW (ref >60)
Glucose, Bld: 75 mg/dL (ref 70–99)
Potassium: 4.6 mmol/L (ref 3.5–5.1)
Sodium: 134 mmol/L — ABNORMAL LOW (ref 135–145)

## 2021-12-02 LAB — CBC
HCT: 25.6 % — ABNORMAL LOW (ref 39.0–52.0)
Hemoglobin: 8.3 g/dL — ABNORMAL LOW (ref 13.0–17.0)
MCH: 26.6 pg (ref 26.0–34.0)
MCHC: 32.4 g/dL (ref 30.0–36.0)
MCV: 82.1 fL (ref 80.0–100.0)
Platelets: 247 10*3/uL (ref 150–400)
RBC: 3.12 MIL/uL — ABNORMAL LOW (ref 4.22–5.81)
RDW: 15.7 % — ABNORMAL HIGH (ref 11.5–15.5)
WBC: 9.1 10*3/uL (ref 4.0–10.5)
nRBC: 0 % (ref 0.0–0.2)

## 2021-12-02 MED ORDER — ADULT MULTIVITAMIN W/MINERALS CH
1.0000 | ORAL_TABLET | Freq: Every day | ORAL | Status: DC
  Administered 2021-12-02 – 2021-12-05 (×4): 1 via ORAL
  Filled 2021-12-02 (×4): qty 1

## 2021-12-02 MED ORDER — LOPERAMIDE HCL 2 MG PO CAPS
2.0000 mg | ORAL_CAPSULE | Freq: Once | ORAL | Status: AC
  Administered 2021-12-02: 17:00:00 2 mg via ORAL
  Filled 2021-12-02: qty 1

## 2021-12-02 NOTE — Progress Notes (Signed)
Initial Nutrition Assessment  DOCUMENTATION CODES:   Obesity unspecified  INTERVENTION:   -Continue Ensure Enlive po TID, each supplement provides 350 kcal and 20 grams of protein  -MVI with minerals daily  NUTRITION DIAGNOSIS:   Increased nutrient needs related to post-op healing as evidenced by estimated needs.  GOAL:   Patient will meet greater than or equal to 90% of their needs  MONITOR:   PO intake, Supplement acceptance, Labs, Weight trends, Skin, I & O's  REASON FOR ASSESSMENT:   Low [place]    ASSESSMENT:   [name] is a 86 y.o. male with medical history significant for chronic combined systolic and diastolic dysfunction CHF, stage III chronic kidney disease, coronary artery disease as well as COPD, bipolar disorder, history of prostate cancer treated with radiation therapy 1994, status post penile prosthesis in 2010 with revision in 2014 who presents to the ER via EMS for evaluation difficulty voiding over the last 24 hours.  Pt admitted with sepsis secondary to UTI.    Reviewed I/O's: -480 ml x 24 hours and -6.6 L since admission  UOP: 600 ml x 24 hours  Spoke with pt at bedside, who reports his appetite fluctuates from day to day. He typically consumes 3 meals per day. Observed lunch tray at bedside, which was untouched. Pt replied "I'm sorry; there are days that I feel like eating and some days that I don't". Pt also consuming Ensure Enlive supplements.   Pt denies any weight loss. Reviewed wt hx; pt has experienced a 8.8% wt loss over the past 11 months, which is not significant for time frame.   Discussed importance of good meal and supplement intake to promote healing.   Per TOC notes, plan to discharge to SNF once medically stable.   Medications reviewed and include lactulose, lactulose, miralax, and senokot.   Labs reviewed: Na: 134.    NUTRITION - FOCUSED PHYSICAL EXAM:  Flowsheet Row Most Recent Value  Orbital Region No depletion  Upper  Arm Region No depletion  Thoracic and Lumbar Region No depletion  Buccal Region No depletion  Temple Region No depletion  Clavicle Bone Region No depletion  Clavicle and Acromion Bone Region No depletion  Scapular Bone Region No depletion  Dorsal Hand No depletion  Patellar Region No depletion  Anterior Thigh Region No depletion  Posterior Calf Region No depletion  Edema (RD Assessment) Mild  Hair Reviewed  Eyes Reviewed  Mouth Reviewed  Skin Reviewed  Nails Reviewed       Diet Order:   Diet Order             DIET SOFT Room service appropriate? Yes; Fluid consistency: Thin  Diet effective now                   EDUCATION NEEDS:   Education needs have been addressed  Skin:  Skin Assessment: Skin Integrity Issues: Skin Integrity Issues:: Stage II Stage II: rt thigh  Last BM:  12/02/21  Height:   Ht Readings from Last 1 Encounters:  11/23/21 5\' 7"  (1.702 m)    Weight:   Wt Readings from Last 1 Encounters:  11/24/21 114.5 kg    Ideal Body Weight:  67.3 kg  BMI:  Body mass index is 39.54 kg/m.  Estimated Nutritional Needs:   Kcal:  2000-2200  Protein:  100-115 grams  Fluid:  > 2 L    Loistine Chance, RD, LDN, [place] Registered Dietitian II Certified Diabetes Care and Education Specialist Please  refer to [facility] for RD and/or RD on-call/weekend/after hours pager

## 2021-12-02 NOTE — TOC Progression Note (Addendum)
Transition of Care [facility]) - Progression Note    Patient Details  Name: [name] MRN: [id number] Date of Birth: Jun 09, 1935  Transition of Care [facility] CM/SW [place], RN Phone Number: 12/02/2021, 1:14 PM  Clinical Narrative:   [facility] has covid outbreak per admissions department.  They are unable to take patient.  Care team notified, daughters will select an alternate facility and call RNCM back.  Once facility is chosen, [facility] will notify [employer].  Addendum:  Daughter chose [facility], message left with facility and admissions, awaiting call back  Expected Discharge Plan: Skilled Nursing Facility Barriers to Discharge: Continued Medical Work up  Expected Discharge Plan and Services Expected Discharge Plan: [place]       Living arrangements for the past 2 months: Single Family Home                                       Social Determinants of Health (SDOH) Interventions    Readmission Risk Interventions Readmission Risk Prevention Plan 11/27/2021 02/17/2021  Transportation Screening Complete Complete  PCP or Specialist Appt within 3-5 Days - (No Data)  [place] or [place] - (No Data)  Social Work Consult for [place] Planning/Counseling - Complete  Medication Review Press photographer) Complete -  PCP or Specialist appointment within 3-5 days of discharge Complete -  [place] or [place] Complete -  SW Recovery Care/Counseling Consult Complete -  Palliative Care Screening Not Applicable -  [place] Complete -  Some recent data might be hidden

## 2021-12-02 NOTE — Progress Notes (Signed)
PT Cancellation Note  Patient Details Name: NATHIN SARAN MRN: [id number] DOB: Oct 21, 1935   Cancelled Treatment:     PT attempt. Pt refused. He was very lethargic however Pryor Curia was able to awake pt. Max encouragement given for OOB activity however pt remained unwilling. Pt even gets slightly agitated with author when attempted to motivate. Will return later and continue to follow pt per current POC.    [place] Pa 12/02/2021, 1:35 PM

## 2021-12-02 NOTE — Progress Notes (Signed)
PT Cancellation Note  Patient Details Name: [name] MRN: [id number] DOB: [birth year]   Cancelled Treatment:     Pt continues to be very lethargic and unwilling to participate in session. Acute PT will continue to follow and progress as able per current POC.    [place] Pa 12/02/2021, 4:41 PM

## 2021-12-02 NOTE — Progress Notes (Signed)
PROGRESS NOTE    [name]  DXI:[id number] DOB: 08-31-1935 DOA: 11/23/2021 PCP: Pcp, No  Assessment & Plan:   Principal Problem:   Sepsis secondary to UTI [facility]) Active Problems:   CAD (coronary artery disease), native coronary artery   Essential hypertension   COPD (chronic obstructive pulmonary disease) (HCC)   Anemia in stage 3b chronic kidney disease ([place])   Hypothyroidism   Type 2 diabetes mellitus with diabetic chronic kidney disease ([place])   Pressure injury of skin    Sepsis: secondary to UTI & bacteremia. Continue on linezolid x 2 weeks as per ID. Sepsis resolved   Bacteremia: blood cxs growing enterococcus faecium. Likely secondary to UTI. Continue on linezolid x 2 weeks as per ID   UTI: likely traumatic catherization contributing to it.  Urine cx growing enterococcus faecium. Continue on linezolid x  weeks as per ID. Has penile prosthesis was closely to rule out any hardware infection   Acute urinary retention: w/ hx of BPH. Apparently pt tried to relieve the 'obstruction' by inserting a metal rod through the urethra.  After that he started having hematuria and severe suprapubic pain.  CT renal no acute issues in abdomen and pelvis. Continue on foley x 7 days as per urology (foley placed on 11/23/21). Office follow-up next week for cystoscopy but pt is still inpatient. Messaged urology today   Dementia: as per pt's daughter. Continue on seroquel and w/ supportive care    AKI on CKD stage IIIb: Cr is labile. Avoid nephrotoxic meds   Hyponatremia: labile. Will continue to monitor   Likely ACD: likely secondary to CKD. 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  Needs removal of hardware and conversion to total hip arthroplasty but surgery would likely need to be done as an outpatient as per ortho surg    RUE swelling: [place] of RUE was neg for DVT     CAD:  continue on aspirin, statin & plavix   Hx of chronic combined CHF: echo from 2020 with EF 45 to 50%, severely increased LV wall thickness. Appears euvolemic. Monitor I/Os   COPD: w/o exacerbation. Continue on bronchodilators    Bipolar disorder: continue on seroquel    DVT prophylaxis: lovenox  Code Status: DNR Family Communication: called pt's daughter but no answer so I left a voicemail  Disposition Plan: likely d/c to SNF  Level of care: Telemetry Medical  Status is: Inpatient  Remains inpatient appropriate because: medically stable and waiting SNF placement now as [facility] is     Consultants:  Ortho surg   Procedures:   Antimicrobials: linezolid    Subjective: Pt is agitated   Objective: Vitals:   12/01/21 1217 12/01/21 1641 12/01/21 2017 12/02/21 0504  BP: (!) 125/51 (!) 121/52 (!) 127/39 (!) 132/52  Pulse: 63 (!) 58 63 62  Resp: 16 18 19 16   Temp: 98.5 F (36.9 C) 98 F (36.7 C) 98.1 F (36.7 C) 99.2 F (37.3 C)  TempSrc: Oral Oral    SpO2: 97% 99% 92% 99%  Weight:      Height:        Intake/Output Summary (Last 24 hours) at 12/02/2021 0747 Last data filed at 12/01/2021 1642 Gross per 24 hour  Intake 120 ml  Output 600 ml  Net -480 ml   Filed Weights   11/23/21 0430 11/24/21 0500  Weight: 112.5 kg  114.5 kg    Examination:  General exam: Appears agitated  Respiratory system: decreased breath sounds b/l  Cardiovascular system: S1 & S2+. No rubs or clicks  Gastrointestinal system: Abd is soft, NT, ND & normal bowel sounds  Central nervous system: Alert and awake. Moves all extremities  Psychiatry: Judgement and insight appear poor. Agitated and frustrated     Data Reviewed: I have personally reviewed following labs and imaging studies  CBC: Recent Labs  Lab 11/26/21 0633 11/29/21 0643 11/30/21 0909 12/01/21 0513  WBC 10.7* 6.5 6.8 7.7  NEUTROABS 8.3*  --   --   --   HGB 8.4* 8.4* 7.6* 7.8*  HCT 25.8* 24.9* 23.3* 23.8*  MCV  82.7 79.6* 80.9 81.8  PLT 186 183 187 154   Basic Metabolic Panel: Recent Labs  Lab 11/26/21 0633 11/27/21 0652 11/28/21 0353 11/29/21 0643 11/30/21 0909 12/01/21 0513  NA 136  --   --   --  132* 137  K 4.6  --   --   --  4.3 4.7  CL 103  --   --   --  107 107  CO2 25  --   --   --  20* 23  GLUCOSE 83  --   --   --  83 80  BUN 37*  --   --   --  28* 27*  CREATININE 2.53* 2.13* 1.90* 1.84* 2.08* 2.18*  CALCIUM 8.0*  --   --   --  7.5* 7.7*   GFR: Estimated Creatinine Clearance: 30.5 mL/min (A) (by C-G formula based on SCr of 2.18 mg/dL (H)). Liver Function Tests: No results for input(s): AST, ALT, ALKPHOS, BILITOT, PROT, ALBUMIN in the last 168 hours. No results for input(s): LIPASE, AMYLASE in the last 168 hours. No results for input(s): AMMONIA in the last 168 hours. Coagulation Profile: No results for input(s): INR, PROTIME in the last 168 hours.  Cardiac Enzymes: No results for input(s): CKTOTAL, CKMB, CKMBINDEX, TROPONINI in the last 168 hours. BNP (last 3 results) No results for input(s): PROBNP in the last 8760 hours. HbA1C: No results for input(s): HGBA1C in the last 72 hours. CBG: No results for input(s): GLUCAP in the last 168 hours. Lipid Profile: No results for input(s): CHOL, HDL, LDLCALC, TRIG, CHOLHDL, LDLDIRECT in the last 72 hours. Thyroid Function Tests: No results for input(s): TSH, T4TOTAL, FREET4, T3FREE, THYROIDAB in the last 72 hours. Anemia Panel: No results for input(s): VITAMINB12, FOLATE, FERRITIN, TIBC, IRON, RETICCTPCT in the last 72 hours. Sepsis Labs: Recent Labs  Lab 11/26/21 0086  LATICACIDVEN 1.2    Recent Results (from the past 240 hour(s))  Urine Culture     Status: Abnormal   Collection Time: 11/23/21  7:41 AM   Specimen: Urine, Catheterized  Result Value Ref Range Status   Specimen Description   Final    URINE, CATHETERIZED Performed at [facility], [address] [place], [place] [address]    Special  Requests   Final    NONE Performed at [facility], [address] [place], [place] [address]    Culture >=100,000 COLONIES/mL ENTEROCOCCUS FAECIUM (A)  Final   Report Status 11/27/2021 FINAL  Final   Organism ID, Bacteria ENTEROCOCCUS FAECIUM (A)  Final      Susceptibility   Enterococcus faecium - MIC*    AMPICILLIN RESISTANT Resistant     NITROFURANTOIN <=16 SENSITIVE Sensitive     VANCOMYCIN <=0.5 SENSITIVE Sensitive     * >=100,000  COLONIES/mL ENTEROCOCCUS FAECIUM  Blood culture (routine x 2)     Status: Abnormal   Collection Time: 11/23/21  9:55 AM   Specimen: BLOOD  Result Value Ref Range Status   Specimen Description   Final    BLOOD RIGHT FA Performed at [facility], [address], [place], [place] [address]    Special Requests   Final    BOTTLES DRAWN AEROBIC AND ANAEROBIC Blood Culture adequate volume Performed at [facility], [address] [place], [place] [address]    Culture  Setup Time   Final    Organism ID to follow GRAM POSITIVE COCCI IN BOTH AEROBIC AND ANAEROBIC BOTTLES CRITICAL RESULT CALLED TO, READ BACK BY AND VERIFIED WITH: Lu Duffel 11/24/21 0857 MW Performed at [facility], [address] [place], [place] [address]    Culture ENTEROCOCCUS FAECIUM (A)  Final   Report Status 11/26/2021 FINAL  Final   Organism ID, Bacteria ENTEROCOCCUS FAECIUM  Final      Susceptibility   Enterococcus faecium - MIC*    AMPICILLIN 4 SENSITIVE Sensitive     VANCOMYCIN <=0.5 SENSITIVE Sensitive     GENTAMICIN SYNERGY SENSITIVE Sensitive     * ENTEROCOCCUS FAECIUM  Blood Culture ID Panel (Reflexed)     Status: Abnormal   Collection Time: 11/23/21  9:55 AM  Result Value Ref Range Status   Enterococcus faecalis NOT DETECTED NOT DETECTED Final   Enterococcus Faecium DETECTED (A) NOT DETECTED Final    Comment: RESULT CALLED TO, READ BACK BY AND VERIFIED WITH: [facility] MITCHELL 11/24/21 0857 MW    Listeria monocytogenes NOT  DETECTED NOT DETECTED Final   Staphylococcus species NOT DETECTED NOT DETECTED Final   Staphylococcus aureus (BCID) NOT DETECTED NOT DETECTED Final   Staphylococcus epidermidis NOT DETECTED NOT DETECTED Final   Staphylococcus lugdunensis NOT DETECTED NOT DETECTED Final   Streptococcus species NOT DETECTED NOT DETECTED Final   Streptococcus agalactiae NOT DETECTED NOT DETECTED Final   Streptococcus pneumoniae NOT DETECTED NOT DETECTED Final   Streptococcus pyogenes NOT DETECTED NOT DETECTED Final   A.calcoaceticus-baumannii NOT DETECTED NOT DETECTED Final   Bacteroides fragilis NOT DETECTED NOT DETECTED Final   Enterobacterales NOT DETECTED NOT DETECTED Final   Enterobacter cloacae complex NOT DETECTED NOT DETECTED Final   Escherichia coli NOT DETECTED NOT DETECTED Final   Klebsiella aerogenes NOT DETECTED NOT DETECTED Final   Klebsiella oxytoca NOT DETECTED NOT DETECTED Final   Klebsiella pneumoniae NOT DETECTED NOT DETECTED Final   Proteus species NOT DETECTED NOT DETECTED Final   Salmonella species NOT DETECTED NOT DETECTED Final   Serratia marcescens NOT DETECTED NOT DETECTED Final   Haemophilus influenzae NOT DETECTED NOT DETECTED Final   Neisseria meningitidis NOT DETECTED NOT DETECTED Final   Pseudomonas aeruginosa NOT DETECTED NOT DETECTED Final   Stenotrophomonas maltophilia NOT DETECTED NOT DETECTED Final   Candida albicans NOT DETECTED NOT DETECTED Final   Candida auris NOT DETECTED NOT DETECTED Final   Candida glabrata NOT DETECTED NOT DETECTED Final   Candida krusei NOT DETECTED NOT DETECTED Final   Candida parapsilosis NOT DETECTED NOT DETECTED Final   Candida tropicalis NOT DETECTED NOT DETECTED Final   Cryptococcus neoformans/gattii NOT DETECTED NOT DETECTED Final   Vancomycin resistance NOT DETECTED NOT DETECTED Final    Comment: Performed at [facility], [address] [place], [place] [address]  Resp Panel by RT-PCR (Flu A&B, Covid) Nasopharyngeal Swab      Status: None   Collection Time:  11/23/21 11:24 AM   Specimen: Nasopharyngeal Swab; Nasopharyngeal(NP) swabs in vial transport medium  Result Value Ref Range Status   SARS Coronavirus 2 by RT PCR NEGATIVE NEGATIVE Final    Comment: (NOTE) SARS-CoV-2 target nucleic acids are NOT DETECTED.  The SARS-CoV-2 RNA is generally detectable in upper respiratory specimens during the acute phase of infection. The lowest concentration of SARS-CoV-2 viral copies this assay can detect is 138 copies/mL. A negative result does not preclude SARS-Cov-2 infection and should not be used as the sole basis for treatment or other patient management decisions. A negative result may occur with  improper specimen collection/handling, submission of specimen other than nasopharyngeal swab, presence of viral mutation(s) within the areas targeted by this assay, and inadequate number of viral copies(<138 copies/mL). A negative result must be combined with clinical observations, patient history, and epidemiological information. The expected result is Negative.  Fact Sheet for Patients:  EntrepreneurPulse.com.au  Fact Sheet for Healthcare Providers:  IncredibleEmployment.be  This test is no t yet approved or cleared by the [place] FDA and  has been authorized for detection and/or diagnosis of SARS-CoV-2 by FDA under an Emergency Use Authorization (EUA). This EUA will remain  in effect (meaning this test can be used) for the duration of the COVID-19 declaration under Section 564(b)(1) of the Act, 21 U.S.C.section 360bbb-3(b)(1), unless the authorization is terminated  or revoked sooner.       Influenza A by PCR NEGATIVE NEGATIVE Final   Influenza B by PCR NEGATIVE NEGATIVE Final    Comment: (NOTE) The Xpert Xpress SARS-CoV-2/FLU/RSV plus assay is intended as an aid in the diagnosis of influenza from Nasopharyngeal swab specimens and should not be used as a sole basis  for treatment. Nasal washings and aspirates are unacceptable for Xpert Xpress SARS-CoV-2/FLU/RSV testing.  Fact Sheet for Patients: EntrepreneurPulse.com.au  Fact Sheet for Healthcare Providers: IncredibleEmployment.be  This test is not yet approved or cleared by the [place] FDA and has been authorized for detection and/or diagnosis of SARS-CoV-2 by FDA under an Emergency Use Authorization (EUA). This EUA will remain in effect (meaning this test can be used) for the duration of the COVID-19 declaration under Section 564(b)(1) of the Act, 21 U.S.C. section 360bbb-3(b)(1), unless the authorization is terminated or revoked.  Performed at [facility], [address] [place], [place] [address]   Blood culture (routine x 2)     Status: Abnormal   Collection Time: 11/23/21  1:55 PM   Specimen: BLOOD  Result Value Ref Range Status   Specimen Description   Final    BLOOD BLOOD LEFT HAND Performed at [facility], [address], [place], [place] [address]    Special Requests   Final    BOTTLES DRAWN AEROBIC AND ANAEROBIC Blood Culture adequate volume Performed at [facility], [address] [place], [place] [address]    Culture  Setup Time   Final    ANAEROBIC BOTTLE ONLY GRAM POSITIVE COCCI CRITICAL VALUE NOTED.  VALUE IS CONSISTENT WITH PREVIOUSLY REPORTED AND CALLED VALUE. Performed at [facility], [address], [place], [place] [address]    Culture (A)  Final    ENTEROCOCCUS FAECIUM SUSCEPTIBILITIES PERFORMED ON PREVIOUS CULTURE WITHIN THE LAST 5 DAYS. Performed at [facility], [place] [address], [place], [place] [address]    Report Status 11/26/2021 FINAL  Final  CULTURE, BLOOD (ROUTINE X 2) w Reflex to ID Panel     Status: None   Collection  Time: 11/26/21  6:39 AM   Specimen: BLOOD  Result Value Ref Range Status   Specimen Description BLOOD LEFT WRIST  Final   Special Requests    Final    BOTTLES DRAWN AEROBIC AND ANAEROBIC Blood Culture adequate volume   Culture   Final    NO GROWTH 5 DAYS Performed at [facility], [address] [place], [place] [address]    Report Status 12/01/2021 FINAL  Final  CULTURE, BLOOD (ROUTINE X 2) w Reflex to ID Panel     Status: None   Collection Time: 11/26/21  6:43 AM   Specimen: BLOOD  Result Value Ref Range Status   Specimen Description BLOOD LEFT HAND  Final   Special Requests   Final    BOTTLES DRAWN AEROBIC AND ANAEROBIC Blood Culture adequate volume   Culture   Final    NO GROWTH 5 DAYS Performed at [facility], [address] [place], [place] [address]    Report Status 12/01/2021 FINAL  Final         Radiology Studies: No results found.      Scheduled Meds:  amLODipine  5 mg Oral Daily   aspirin EC  81 mg Oral Daily   atorvastatin  40 mg Oral QPM   carvedilol  6.25 mg Oral BID   Chlorhexidine Gluconate Cloth  6 each Topical Daily   clopidogrel  75 mg Oral Daily   enoxaparin (LOVENOX) injection  0.5 mg/kg Subcutaneous Q24H   feeding supplement  237 mL Oral TID BM   fluticasone furoate-vilanterol  1 puff Inhalation Daily   And   umeclidinium bromide  1 puff Inhalation Daily   ipratropium  2 spray Each Nare Daily   lactulose  20 g Oral BID   lidocaine  1 patch Transdermal Q24H   linezolid  600 mg Oral Q12H   lubiprostone  8 mcg Oral BID   oxyCODONE  10 mg Oral Q8H   polyethylene glycol  17 g Oral Daily   QUEtiapine  50 mg Oral QHS   senna-docusate  2 tablet Oral BID   tamsulosin  0.4 mg Oral Daily   Continuous Infusions:   LOS: 9 days    Time spent: 15 mins     Wyvonnia Dusky, MD [facility] Pager [phone]  If 7PM-7AM, please contact night-coverage 12/02/2021, 7:47 AM

## 2021-12-03 LAB — CBC
HCT: 24.9 % — ABNORMAL LOW (ref 39.0–52.0)
Hemoglobin: 8.6 g/dL — ABNORMAL LOW (ref 13.0–17.0)
MCH: 27.9 pg (ref 26.0–34.0)
MCHC: 34.5 g/dL (ref 30.0–36.0)
MCV: 80.8 fL (ref 80.0–100.0)
Platelets: 289 10*3/uL (ref 150–400)
RBC: 3.08 MIL/uL — ABNORMAL LOW (ref 4.22–5.81)
RDW: 15.2 % (ref 11.5–15.5)
WBC: 9.2 10*3/uL (ref 4.0–10.5)
nRBC: 0 % (ref 0.0–0.2)

## 2021-12-03 LAB — BASIC METABOLIC PANEL
Anion gap: 7 (ref 5–15)
BUN: 24 mg/dL — ABNORMAL HIGH (ref 8–23)
CO2: 22 mmol/L (ref 22–32)
Calcium: 7.9 mg/dL — ABNORMAL LOW (ref 8.9–10.3)
Chloride: 107 mmol/L (ref 98–111)
Creatinine, Ser: 2.2 mg/dL — ABNORMAL HIGH (ref 0.61–1.24)
GFR, Estimated: 29 mL/min — ABNORMAL LOW (ref >60)
Glucose, Bld: 91 mg/dL (ref 70–99)
Potassium: 4.3 mmol/L (ref 3.5–5.1)
Sodium: 136 mmol/L (ref 135–145)

## 2021-12-03 NOTE — TOC Progression Note (Signed)
Transition of Care [facility]) - Progression Note    Patient Details  Name: [name] MRN: [id number] Date of Birth: 1935/10/08  Transition of Care [facility]) CM/SW [place], RN Phone Number: 12/03/2021, 10:50 AM  Clinical Narrative:   Spoke with Tammy at [facility] and obtained Approval amd requested that the facility to be changed from [facility] to [employer] for SNF is [phone], auth for EMS Corey Harold is [phone]    Expected Discharge Plan: [place] Barriers to Discharge: Continued Medical Work up  Expected Discharge Plan and Services Expected Discharge Plan: [place]       Living arrangements for the past 2 months: Single Family Home                                       Social Determinants of Health (SDOH) Interventions    Readmission Risk Interventions Readmission Risk Prevention Plan 11/27/2021 02/17/2021  Transportation Screening Complete Complete  PCP or Specialist Appt within 3-5 Days - (No Data)  [place] or [place] - (No Data)  Social Work Consult for [place] Planning/Counseling - Complete  Medication Review Press photographer) Complete -  PCP or Specialist appointment within 3-5 days of discharge Complete -  [place] or [place] Complete -  SW Recovery Care/Counseling Consult Complete -  Palliative Care Screening Not Applicable -  [place] Complete -  Some recent data might be hidden

## 2021-12-03 NOTE — Progress Notes (Signed)
PROGRESS NOTE    [name]  ERX:[id number] DOB: [birth year] DOA: 11/23/2021 PCP: Pcp, No  Assessment & Plan:   Principal Problem:   Sepsis secondary to UTI [facility]) Active Problems:   CAD (coronary artery disease), native coronary artery   Essential hypertension   COPD (chronic obstructive pulmonary disease) (HCC)   Anemia in stage 3b chronic kidney disease ([place])   Hypothyroidism   Type 2 diabetes mellitus with diabetic chronic kidney disease ([place])   Pressure injury of skin    Sepsis: secondary to UTI & bacteremia. Continue on linezolid x 2 weeks as per ID. Sepsis resolved   Bacteremia: blood cxs growing enterococcus faecium. Likely secondary to UTI. Continue on linezolid x 2 weeks as per ID   UTI: likely traumatic catherization contributing to it.  Urine cx growing enterococcus faecium. Continue on linezolid x  weeks as per ID. Has penile prosthesis was closely to rule out any hardware infection   Acute urinary retention: w/ hx of BPH. Apparently pt tried to relieve the 'obstruction' by inserting a metal rod through the urethra.  After that he started having hematuria and severe suprapubic pain.  CT renal no acute issues in abdomen and pelvis. Continue on foley x 7 days as per urology (foley placed on 11/23/21). Office follow-up for cystoscopy and uro (Dr. Bernardo Heater) wants the foley to continue until pt can f/u outpatient  Dementia: as per pt's daughter. Continue on seoquel and w/ supportive    AKI on CKD stage IIIb: Cr is labile. Avoid nephrotoxic meds  Hyponatremia: resolved   Likely ACD: likely secondary to CKD. 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  Needs removal of hardware and conversion to total hip arthroplasty but surgery would likely need to be done as an outpatient as per ortho surg    RUE swelling: [place] of RUE was neg for DVT     CAD:  continue on plavix, statin & aspirin   Hx of chronic combined CHF: echo from 2020 with EF 45 to 50%, severely increased LV wall thickness. Appears euvolemic. Monitor I/Os   COPD: w/o exacerbation. Continue on bronchodilators    Bipolar disorder: continue on seroquel    DVT prophylaxis: lovenox  Code Status: DNR Family Communication:  Disposition Plan: likely d/c to SNF  Level of care: Telemetry Medical  Status is: Inpatient  Remains inpatient appropriate because: medically stable for d/c. Unable to go to [place] secondary to covid outbreak there. CM is working on getting the pt to [facility]:  Ortho surg   Procedures:   Antimicrobials: linezolid    Subjective: Pt c/o fatigue   Objective: Vitals:   12/02/21 2016 12/02/21 2016 12/03/21 0518 12/03/21 0808  BP: (!) 143/72 (!) 143/72 (!) 158/65 (!) 148/58  Pulse: 62 62 83 75  Resp: 16 16 20 20   Temp: 98.2 F (36.8 C) 98.2 F (36.8 C) 98.3 F (36.8 C) 98.5 F (36.9 C)  TempSrc: Oral Oral Oral Oral  SpO2: 99% 98% 100% 99%  Weight:      Height:        Intake/Output Summary (Last 24 hours) at 12/03/2021 0828 Last data filed at 12/03/2021 0518 Gross per 24 hour  Intake --  Output 950 ml  Net -950 ml   Filed Weights   11/23/21 0430 11/24/21 0500  Weight: 112.5 kg 114.5 kg  Examination:  General exam: Appears calm & comfortable  Respiratory system: diminished breath sounds b/l  Cardiovascular system: S1/S2+. No rubs or clicks  Gastrointestinal system: Abd is soft, NT, obese & normal bowel sounds  Central nervous system: Alert and awake. Moves all extremities  Psychiatry: judgement and insight appears poor. Flat mood and affect    Data Reviewed: I have personally reviewed following labs and imaging studies  CBC: Recent Labs  Lab 11/29/21 0643 11/30/21 0909 12/01/21 0513 12/02/21 0731 12/03/21 0457  WBC 6.5 6.8 7.7 9.1 9.2  HGB 8.4* 7.6* 7.8* 8.3* 8.6*  HCT 24.9* 23.3* 23.8* 25.6*  24.9*  MCV 79.6* 80.9 81.8 82.1 80.8  PLT 183 187 225 247 622   Basic Metabolic Panel: Recent Labs  Lab 11/29/21 0643 11/30/21 0909 12/01/21 0513 12/02/21 0731 12/03/21 0457  NA  --  132* 137 134* 136  K  --  4.3 4.7 4.6 4.3  CL  --  107 107 107 107  CO2  --  20* 23 21* 22  GLUCOSE  --  83 80 75 91  BUN  --  28* 27* 25* 24*  CREATININE 1.84* 2.08* 2.18* 2.16* 2.20*  CALCIUM  --  7.5* 7.7* 7.8* 7.9*   GFR: Estimated Creatinine Clearance: 30.2 mL/min (A) (by C-G formula based on SCr of 2.2 mg/dL (H)). Liver Function Tests: No results for input(s): AST, ALT, ALKPHOS, BILITOT, PROT, ALBUMIN in the last 168 hours. No results for input(s): LIPASE, AMYLASE in the last 168 hours. No results for input(s): AMMONIA in the last 168 hours. Coagulation Profile: No results for input(s): INR, PROTIME in the last 168 hours.  Cardiac Enzymes: No results for input(s): CKTOTAL, CKMB, CKMBINDEX, TROPONINI in the last 168 hours. BNP (last 3 results) No results for input(s): PROBNP in the last 8760 hours. HbA1C: No results for input(s): HGBA1C in the last 72 hours. CBG: No results for input(s): GLUCAP in the last 168 hours. Lipid Profile: No results for input(s): CHOL, HDL, LDLCALC, TRIG, CHOLHDL, LDLDIRECT in the last 72 hours. Thyroid Function Tests: No results for input(s): TSH, T4TOTAL, FREET4, T3FREE, THYROIDAB in the last 72 hours. Anemia Panel: No results for input(s): VITAMINB12, FOLATE, FERRITIN, TIBC, IRON, RETICCTPCT in the last 72 hours. Sepsis Labs: No results for input(s): PROCALCITON, LATICACIDVEN in the last 168 hours.   Recent Results (from the past 240 hour(s))  Blood culture (routine x 2)     Status: Abnormal   Collection Time: 11/23/21  9:55 AM   Specimen: BLOOD  Result Value Ref Range Status   Specimen Description   Final    BLOOD RIGHT FA Performed at [facility], [address], [place], [place] [address]    Special Requests   Final    BOTTLES  DRAWN AEROBIC AND ANAEROBIC Blood Culture adequate volume Performed at [facility], [address] [place], [place] [address]    Culture  Setup Time   Final    Organism ID to follow GRAM POSITIVE COCCI IN BOTH AEROBIC AND ANAEROBIC BOTTLES CRITICAL RESULT CALLED TO, READ BACK BY AND VERIFIED WITH: Lu Duffel 11/24/21 0857 MW Performed at [facility], [address] [place], [place] [address]    Culture ENTEROCOCCUS FAECIUM (A)  Final   Report Status 11/26/2021 FINAL  Final   Organism ID, Bacteria ENTEROCOCCUS FAECIUM  Final      Susceptibility   Enterococcus faecium - MIC*    AMPICILLIN 4 SENSITIVE Sensitive     VANCOMYCIN <=0.5 SENSITIVE  Sensitive     GENTAMICIN SYNERGY SENSITIVE Sensitive     * ENTEROCOCCUS FAECIUM  Blood Culture ID Panel (Reflexed)     Status: Abnormal   Collection Time: 11/23/21  9:55 AM  Result Value Ref Range Status   Enterococcus faecalis NOT DETECTED NOT DETECTED Final   Enterococcus Faecium DETECTED (A) NOT DETECTED Final    Comment: RESULT CALLED TO, READ BACK BY AND VERIFIED WITH: [facility] MITCHELL 11/24/21 0857 MW    Listeria monocytogenes NOT DETECTED NOT DETECTED Final   Staphylococcus species NOT DETECTED NOT DETECTED Final   Staphylococcus aureus (BCID) NOT DETECTED NOT DETECTED Final   Staphylococcus epidermidis NOT DETECTED NOT DETECTED Final   Staphylococcus lugdunensis NOT DETECTED NOT DETECTED Final   Streptococcus species NOT DETECTED NOT DETECTED Final   Streptococcus agalactiae NOT DETECTED NOT DETECTED Final   Streptococcus pneumoniae NOT DETECTED NOT DETECTED Final   Streptococcus pyogenes NOT DETECTED NOT DETECTED Final   A.calcoaceticus-baumannii NOT DETECTED NOT DETECTED Final   Bacteroides fragilis NOT DETECTED NOT DETECTED Final   Enterobacterales NOT DETECTED NOT DETECTED Final   Enterobacter cloacae complex NOT DETECTED NOT DETECTED Final   Escherichia coli NOT DETECTED NOT DETECTED Final   Klebsiella  aerogenes NOT DETECTED NOT DETECTED Final   Klebsiella oxytoca NOT DETECTED NOT DETECTED Final   Klebsiella pneumoniae NOT DETECTED NOT DETECTED Final   Proteus species NOT DETECTED NOT DETECTED Final   Salmonella species NOT DETECTED NOT DETECTED Final   Serratia marcescens NOT DETECTED NOT DETECTED Final   Haemophilus influenzae NOT DETECTED NOT DETECTED Final   Neisseria meningitidis NOT DETECTED NOT DETECTED Final   Pseudomonas aeruginosa NOT DETECTED NOT DETECTED Final   Stenotrophomonas maltophilia NOT DETECTED NOT DETECTED Final   Candida albicans NOT DETECTED NOT DETECTED Final   Candida auris NOT DETECTED NOT DETECTED Final   Candida glabrata NOT DETECTED NOT DETECTED Final   Candida krusei NOT DETECTED NOT DETECTED Final   Candida parapsilosis NOT DETECTED NOT DETECTED Final   Candida tropicalis NOT DETECTED NOT DETECTED Final   Cryptococcus neoformans/gattii NOT DETECTED NOT DETECTED Final   Vancomycin resistance NOT DETECTED NOT DETECTED Final    Comment: Performed at [facility], [address] [place], [place] [address]  Resp Panel by RT-PCR (Flu A&B, Covid) Nasopharyngeal Swab     Status: None   Collection Time: 11/23/21 11:24 AM   Specimen: Nasopharyngeal Swab; Nasopharyngeal(NP) swabs in vial transport medium  Result Value Ref Range Status   SARS Coronavirus 2 by RT PCR NEGATIVE NEGATIVE Final    Comment: (NOTE) SARS-CoV-2 target nucleic acids are NOT DETECTED.  The SARS-CoV-2 RNA is generally detectable in upper respiratory specimens during the acute phase of infection. The lowest concentration of SARS-CoV-2 viral copies this assay can detect is 138 copies/mL. A negative result does not preclude SARS-Cov-2 infection and should not be used as the sole basis for treatment or other patient management decisions. A negative result may occur with  improper specimen collection/handling, submission of specimen other than nasopharyngeal swab, presence of viral  mutation(s) within the areas targeted by this assay, and inadequate number of viral copies(<138 copies/mL). A negative result must be combined with clinical observations, patient history, and epidemiological information. The expected result is Negative.  Fact Sheet for Patients:  EntrepreneurPulse.com.au  Fact Sheet for Healthcare Providers:  IncredibleEmployment.be  This test is no t yet approved or cleared by the [place] FDA and  has been authorized for detection and/or diagnosis of SARS-CoV-2 by FDA  under an Emergency Use Authorization (EUA). This EUA will remain  in effect (meaning this test can be used) for the duration of the COVID-19 declaration under Section 564(b)(1) of the Act, 21 U.S.C.section 360bbb-3(b)(1), unless the authorization is terminated  or revoked sooner.       Influenza A by PCR NEGATIVE NEGATIVE Final   Influenza B by PCR NEGATIVE NEGATIVE Final    Comment: (NOTE) The Xpert Xpress SARS-CoV-2/FLU/RSV plus assay is intended as an aid in the diagnosis of influenza from Nasopharyngeal swab specimens and should not be used as a sole basis for treatment. Nasal washings and aspirates are unacceptable for Xpert Xpress SARS-CoV-2/FLU/RSV testing.  Fact Sheet for Patients: EntrepreneurPulse.com.au  Fact Sheet for Healthcare Providers: IncredibleEmployment.be  This test is not yet approved or cleared by the [place] FDA and has been authorized for detection and/or diagnosis of SARS-CoV-2 by FDA under an Emergency Use Authorization (EUA). This EUA will remain in effect (meaning this test can be used) for the duration of the COVID-19 declaration under Section 564(b)(1) of the Act, 21 U.S.C. section 360bbb-3(b)(1), unless the authorization is terminated or revoked.  Performed at [facility], [address] [place], [place] [address]   Blood culture (routine x 2)      Status: Abnormal   Collection Time: 11/23/21  1:55 PM   Specimen: BLOOD  Result Value Ref Range Status   Specimen Description   Final    BLOOD BLOOD LEFT HAND Performed at [facility], [address], [place], [place] [address]    Special Requests   Final    BOTTLES DRAWN AEROBIC AND ANAEROBIC Blood Culture adequate volume Performed at [facility], [address] [place], [place] [address]    Culture  Setup Time   Final    ANAEROBIC BOTTLE ONLY GRAM POSITIVE COCCI CRITICAL VALUE NOTED.  VALUE IS CONSISTENT WITH PREVIOUSLY REPORTED AND CALLED VALUE. Performed at [facility], [address] [place], [place] [address]    Culture (A)  Final    ENTEROCOCCUS FAECIUM SUSCEPTIBILITIES PERFORMED ON PREVIOUS CULTURE WITHIN THE LAST 5 DAYS. Performed at [facility], [address] [place], [place] [address]    Report Status 11/26/2021 FINAL  Final  CULTURE, BLOOD (ROUTINE X 2) w Reflex to ID Panel     Status: None   Collection Time: 11/26/21  6:39 AM   Specimen: BLOOD  Result Value Ref Range Status   Specimen Description BLOOD LEFT WRIST  Final   Special Requests   Final    BOTTLES DRAWN AEROBIC AND ANAEROBIC Blood Culture adequate volume   Culture   Final    NO GROWTH 5 DAYS Performed at [facility], [address] [place], [place] [address]    Report Status 12/01/2021 FINAL  Final  CULTURE, BLOOD (ROUTINE X 2) w Reflex to ID Panel     Status: None   Collection Time: 11/26/21  6:43 AM   Specimen: BLOOD  Result Value Ref Range Status   Specimen Description BLOOD LEFT HAND  Final   Special Requests   Final    BOTTLES DRAWN AEROBIC AND ANAEROBIC Blood Culture adequate volume   Culture   Final    NO GROWTH 5 DAYS Performed at [facility], [address] [place],  [address]    Report Status 12/01/2021 FINAL  Final         Radiology Studies: No results found.      Scheduled Meds:  amLODipine  5 mg  Oral Daily   aspirin EC  81 mg Oral Daily   atorvastatin  40 mg Oral QPM   carvedilol  6.25 mg Oral BID   Chlorhexidine Gluconate Cloth  6 each Topical Daily   clopidogrel  75 mg Oral Daily   enoxaparin (LOVENOX) injection  0.5 mg/kg Subcutaneous Q24H   feeding supplement  237 mL Oral TID BM   fluticasone furoate-vilanterol  1 puff Inhalation Daily   And   umeclidinium bromide  1 puff Inhalation Daily   ipratropium  2 spray Each Nare Daily   lactulose  20 g Oral BID   lidocaine  1 patch Transdermal Q24H   linezolid  600 mg Oral Q12H   lubiprostone  8 mcg Oral BID   multivitamin with minerals  1 tablet Oral Daily   oxyCODONE  10 mg Oral Q8H   polyethylene glycol  17 g Oral Daily   QUEtiapine  50 mg Oral QHS   senna-docusate  2 tablet Oral BID   tamsulosin  0.4 mg Oral Daily   Continuous Infusions:   LOS: 10 days    Time spent: 15 mins     Wyvonnia Dusky, MD [facility] Pager [phone]  If 7PM-7AM, please contact night-coverage 12/03/2021, 8:28 AM

## 2021-12-03 NOTE — Progress Notes (Signed)
Patient confused and agitated, yelling out. Patient states he is frustrated and wants to leave. Patient goes back and forth between saying he's going to get up and leave and stating he knows he can't walk well. Patient then opens up his wallet and pulls out a stack of cash (amount uncertain). Patient refuses to allow RN to count money or call security to lock up money/wallet. Patient becomes combative when RN suggests security be called to lock up valuables.

## 2021-12-03 NOTE — Progress Notes (Signed)
0835- Patient complaining of severe abdominal pain and what appears to be spasms. Per overnight RN, patient pulled on foley (placed by urology) multiple times during spells of increased confusion (baseline confusion present). 229ml of urine noted in foley bag, blood tinged with clots in tubing. Bladder scan performed and 232ml noted to be in bladder.  MD made aware. Okay to manipulate.  Manipulation completed and multiple small blood clots flushed from foley. Immediate 268ml return of urine occurred. Patient medicated for pain with prn medication. MD made aware. Will continue to monitor.

## 2021-12-04 LAB — CBC
HCT: 23.8 % — ABNORMAL LOW (ref 39.0–52.0)
Hemoglobin: 7.8 g/dL — ABNORMAL LOW (ref 13.0–17.0)
MCH: 26.5 pg (ref 26.0–34.0)
MCHC: 32.8 g/dL (ref 30.0–36.0)
MCV: 81 fL (ref 80.0–100.0)
Platelets: 308 10*3/uL (ref 150–400)
RBC: 2.94 MIL/uL — ABNORMAL LOW (ref 4.22–5.81)
RDW: 15.3 % (ref 11.5–15.5)
WBC: 7.3 10*3/uL (ref 4.0–10.5)
nRBC: 0 % (ref 0.0–0.2)

## 2021-12-04 LAB — BASIC METABOLIC PANEL
Anion gap: 7 (ref 5–15)
BUN: 21 mg/dL (ref 8–23)
CO2: 20 mmol/L — ABNORMAL LOW (ref 22–32)
Calcium: 8.1 mg/dL — ABNORMAL LOW (ref 8.9–10.3)
Chloride: 105 mmol/L (ref 98–111)
Creatinine, Ser: 2.2 mg/dL — ABNORMAL HIGH (ref 0.61–1.24)
GFR, Estimated: 29 mL/min — ABNORMAL LOW (ref >60)
Glucose, Bld: 84 mg/dL (ref 70–99)
Potassium: 4.4 mmol/L (ref 3.5–5.1)
Sodium: 132 mmol/L — ABNORMAL LOW (ref 135–145)

## 2021-12-04 NOTE — Progress Notes (Signed)
PROGRESS NOTE    [name]  AYT:[id number] DOB: Sep 30, 1935 DOA: 11/23/2021 PCP: Pcp, No  Assessment & Plan:   Principal Problem:   Sepsis secondary to UTI [facility]) Active Problems:   CAD (coronary artery disease), native coronary artery   Essential hypertension   COPD (chronic obstructive pulmonary disease) (HCC)   Anemia in stage 3b chronic kidney disease ([place])   Hypothyroidism   Type 2 diabetes mellitus with diabetic chronic kidney disease ([place])   Pressure injury of skin    Sepsis: secondary to UTI & bacteremia. Continue on linezolid x 2 weeks as per ID. Sepsis resolved   Bacteremia: blood cxs growing enterococcus faecium. Likely secondary to UTI. Continue on linezolid x 2 weeks as per ID    UTI: likely traumatic catherization contributing to it.  Urine cx growing enterococcus faecium. Continue on linezolid x  2 weeks as per ID. Has penile prosthesis was closely to rule out any hardware infection   Acute urinary retention: w/ hx of BPH. Apparently pt tried to relieve the 'obstruction' by inserting a metal rod through the urethra.  After that he started having hematuria and severe suprapubic pain.  CT renal no acute issues in abdomen and pelvis. Continue on foley x 7 days as per urology (foley placed on 11/23/21). Office follow-up for cystoscopy and uro (Dr. Bernardo Heater) wants the foley to continue until pt can f/u outpatient  Dementia: as per pt's daughter. Continue on seoquel and w/ supportive    AKI on CKD stage IIIb: Cr is stable from day prior. Avoid nephrotoxic meds   Hyponatremia: labile. Will continue to monitor   Likely ACD: likely secondary to CKD. 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  Needs removal of hardware and conversion to total hip arthroplasty but surgery would likely need to be done as an outpatient as per ortho surg    RUE swelling: [place] of  RUE was neg for DVT     CAD: continue on aspirin, plavix & statin   Hx of chronic combined CHF: echo from 2020 with EF 45 to 50%, severely increased LV wall thickness. Appears euvolemic. Monitor I/Os   COPD: w/o exacerbation. Continue on bronchodilators    Bipolar disorder: continue on seroquel    DVT prophylaxis: lovenox  Code Status: DNR Family Communication: discussed pt's care w/ pt's daughter, and answered her questions  Disposition Plan: likely d/c to SNF  Level of care: Telemetry Medical  Status is: Inpatient  Remains inpatient appropriate because: medically stable for d/c. Unable to go to [place] secondary to covid outbreak there. CM is working on getting the pt to [facility]:  Ortho surg   Procedures:   Antimicrobials: linezolid    Subjective: Pt c/o denies any complaints   Objective: Vitals:   12/03/21 1542 12/03/21 2112 12/04/21 0021 12/04/21 0433  BP: 128/64 (!) 148/65 (!) 142/62 (!) 141/64  Pulse: 61 65 64 69  Resp: 20 20 20 18   Temp: 98 F (36.7 C) 98.3 F (36.8 C) 98.2 F (36.8 C) 98.6 F (37 C)  TempSrc: Oral Oral Oral Oral  SpO2: 98% 100% 98% 98%  Weight:      Height:        Intake/Output Summary (Last 24 hours) at 12/04/2021 0748 Last data filed at 12/04/2021 0433 Gross per 24 hour  Intake 720 ml  Output 1400 ml  Net -680 ml   Filed Weights   11/23/21 0430 11/24/21 0500  Weight: 112.5 kg 114.5 kg    Examination:  General exam: Appears comfortable  Respiratory system: decreased breath sounds b/l  Cardiovascular system: S1 & S2+. No rubs or clicks  Gastrointestinal system: Abd is soft, NT,obese & normal bowel sounds  Central nervous system: Alert and oriented. Moves all extremities  Psychiatry: judgement and insight appears poor. Flat mood and affect     Data Reviewed: I have personally reviewed following labs and imaging studies  CBC: Recent Labs  Lab 11/30/21 0909 12/01/21 0513 12/02/21 0731  12/03/21 0457 12/04/21 0303  WBC 6.8 7.7 9.1 9.2 7.3  HGB 7.6* 7.8* 8.3* 8.6* 7.8*  HCT 23.3* 23.8* 25.6* 24.9* 23.8*  MCV 80.9 81.8 82.1 80.8 81.0  PLT 187 225 247 289 967   Basic Metabolic Panel: Recent Labs  Lab 11/30/21 0909 12/01/21 0513 12/02/21 0731 12/03/21 0457 12/04/21 0303  NA 132* 137 134* 136 132*  K 4.3 4.7 4.6 4.3 4.4  CL 107 107 107 107 105  CO2 20* 23 21* 22 20*  GLUCOSE 83 80 75 91 84  BUN 28* 27* 25* 24* 21  CREATININE 2.08* 2.18* 2.16* 2.20* 2.20*  CALCIUM 7.5* 7.7* 7.8* 7.9* 8.1*   GFR: Estimated Creatinine Clearance: 30.2 mL/min (A) (by C-G formula based on SCr of 2.2 mg/dL (H)). Liver Function Tests: No results for input(s): AST, ALT, ALKPHOS, BILITOT, PROT, ALBUMIN in the last 168 hours. No results for input(s): LIPASE, AMYLASE in the last 168 hours. No results for input(s): AMMONIA in the last 168 hours. Coagulation Profile: No results for input(s): INR, PROTIME in the last 168 hours.  Cardiac Enzymes: No results for input(s): CKTOTAL, CKMB, CKMBINDEX, TROPONINI in the last 168 hours. BNP (last 3 results) No results for input(s): PROBNP in the last 8760 hours. HbA1C: No results for input(s): HGBA1C in the last 72 hours. CBG: No results for input(s): GLUCAP in the last 168 hours. Lipid Profile: No results for input(s): CHOL, HDL, LDLCALC, TRIG, CHOLHDL, LDLDIRECT in the last 72 hours. Thyroid Function Tests: No results for input(s): TSH, T4TOTAL, FREET4, T3FREE, THYROIDAB in the last 72 hours. Anemia Panel: No results for input(s): VITAMINB12, FOLATE, FERRITIN, TIBC, IRON, RETICCTPCT in the last 72 hours. Sepsis Labs: No results for input(s): PROCALCITON, LATICACIDVEN in the last 168 hours.   Recent Results (from the past 240 hour(s))  CULTURE, BLOOD (ROUTINE X 2) w Reflex to ID Panel     Status: None   Collection Time: 11/26/21  6:39 AM   Specimen: BLOOD  Result Value Ref Range Status   Specimen Description BLOOD LEFT WRIST  Final    Special Requests   Final    BOTTLES DRAWN AEROBIC AND ANAEROBIC Blood Culture adequate volume   Culture   Final    NO GROWTH 5 DAYS Performed at [facility], [address] [place], [place] [address]    Report Status 12/01/2021 FINAL  Final  CULTURE, BLOOD (ROUTINE X 2) w Reflex to ID Panel     Status: None   Collection Time: 11/26/21  6:43 AM   Specimen: BLOOD  Result Value Ref Range Status   Specimen Description BLOOD LEFT HAND  Final   Special Requests   Final    BOTTLES DRAWN AEROBIC AND ANAEROBIC Blood Culture adequate volume   Culture   Final    NO GROWTH 5 DAYS Performed at [facility], [place], [place]  [address]    Report Status 12/01/2021 FINAL  Final         Radiology Studies: No results found.      Scheduled Meds:  amLODipine  5 mg Oral Daily   aspirin EC  81 mg Oral Daily   atorvastatin  40 mg Oral QPM   carvedilol  6.25 mg Oral BID   Chlorhexidine Gluconate Cloth  6 each Topical Daily   clopidogrel  75 mg Oral Daily   enoxaparin (LOVENOX) injection  0.5 mg/kg Subcutaneous Q24H   feeding supplement  237 mL Oral TID BM   fluticasone furoate-vilanterol  1 puff Inhalation Daily   And   umeclidinium bromide  1 puff Inhalation Daily   ipratropium  2 spray Each Nare Daily   lactulose  20 g Oral BID   lidocaine  1 patch Transdermal Q24H   linezolid  600 mg Oral Q12H   lubiprostone  8 mcg Oral BID   multivitamin with minerals  1 tablet Oral Daily   oxyCODONE  10 mg Oral Q8H   polyethylene glycol  17 g Oral Daily   QUEtiapine  50 mg Oral QHS   senna-docusate  2 tablet Oral BID   tamsulosin  0.4 mg Oral Daily   Continuous Infusions:   LOS: 11 days    Time spent: 15 mins     Wyvonnia Dusky, MD [facility] Pager [phone]  If 7PM-7AM, please contact night-coverage 12/04/2021, 7:48 AM

## 2021-12-05 LAB — BASIC METABOLIC PANEL
Anion gap: 6 (ref 5–15)
BUN: 19 mg/dL (ref 8–23)
CO2: 21 mmol/L — ABNORMAL LOW (ref 22–32)
Calcium: 7.9 mg/dL — ABNORMAL LOW (ref 8.9–10.3)
Chloride: 108 mmol/L (ref 98–111)
Creatinine, Ser: 2.17 mg/dL — ABNORMAL HIGH (ref 0.61–1.24)
GFR, Estimated: 29 mL/min — ABNORMAL LOW (ref >60)
Glucose, Bld: 90 mg/dL (ref 70–99)
Potassium: 4.5 mmol/L (ref 3.5–5.1)
Sodium: 135 mmol/L (ref 135–145)

## 2021-12-05 LAB — CBC
HCT: 24 % — ABNORMAL LOW (ref 39.0–52.0)
Hemoglobin: 7.8 g/dL — ABNORMAL LOW (ref 13.0–17.0)
MCH: 26.7 pg (ref 26.0–34.0)
MCHC: 32.5 g/dL (ref 30.0–36.0)
MCV: 82.2 fL (ref 80.0–100.0)
Platelets: 319 10*3/uL (ref 150–400)
RBC: 2.92 MIL/uL — ABNORMAL LOW (ref 4.22–5.81)
RDW: 15.1 % (ref 11.5–15.5)
WBC: 5.8 10*3/uL (ref 4.0–10.5)
nRBC: 0 % (ref 0.0–0.2)

## 2021-12-05 LAB — RESP PANEL BY RT-PCR (FLU A&B, COVID) ARPGX2
Influenza A by PCR: NEGATIVE
Influenza B by PCR: NEGATIVE
SARS Coronavirus 2 by RT PCR: NEGATIVE

## 2021-12-05 MED ORDER — LINEZOLID 600 MG PO TABS: 600.0000 mg | ORAL_TABLET | Freq: Two times a day (BID) | ORAL | 0 refills | Status: DC

## 2021-12-05 NOTE — Progress Notes (Signed)
PT Cancellation Note  Patient Details Name: DEANDRE BRANNAN MRN: [id number] DOB: 03-26-1935   Cancelled Treatment:     PT attempted. Patient refused. Continues to be very lethargic despite Max encouragement for EOB and/or OOB activities. Patient remained unwilling. Patient reported "maybe later". Acute PT will continue to follow and progress as able per current POC.    Iva Boop, PT  12/05/21. 10:15 AM

## 2021-12-05 NOTE — TOC Progression Note (Addendum)
Transition of Care [facility]) - Progression Note    Patient Details  Name: [name] MRN: [id number] Date of Birth: 1934/12/29  Transition of Care [facility]) CM/SW [place], RN Phone Number: 12/05/2021, 12:57 PM  Clinical Narrative:   Patient discharging to [facility], [place] today.   Family and Patient aware Patient will transport to [facility] and go to [facility] room 213 as per Perrin Smack at [employer]   Expected Discharge Plan: [place] Barriers to Discharge: Continued Medical Work up  Expected Discharge Plan and Services Expected Discharge Plan: [place]       Living arrangements for the past 2 months: Single Family Home Expected Discharge Date: 12/05/21                                     Social Determinants of Health (SDOH) Interventions    Readmission Risk Interventions Readmission Risk Prevention Plan 11/27/2021 02/17/2021  Transportation Screening Complete Complete  PCP or Specialist Appt within 3-5 Days - (No Data)  [place] or [place] - (No Data)  Social Work Consult for [place] Planning/Counseling - Complete  Medication Review Press photographer) Complete -  PCP or Specialist appointment within 3-5 days of discharge Complete -  [place] or [place] Complete -  SW Recovery Care/Counseling Consult Complete -  Palliative Care Screening Not Applicable -  [place] Complete -  Some recent data might be hidden

## 2021-12-05 NOTE — Progress Notes (Signed)
Patient transported via Biomedical scientist by [facility] to [facility]. IV has been removed. Patient care provided by NT. Foley cath intact and patent. Personal belongings gathered and sent with patient. No signs of acute distress noted and/or voiced. Discharge order is now complete.

## 2021-12-05 NOTE — Plan of Care (Signed)

## 2021-12-05 NOTE — Progress Notes (Signed)
Report provided to [facility].

## 2021-12-05 NOTE — Plan of Care (Signed)
°  Problem: Education: Goal: Knowledge of General Education information will improve Description: Including pain rating scale, medication(s)/side effects and non-pharmacologic comfort measures Outcome: Progressing   Problem: Clinical Measurements: Goal: Cardiovascular complication will be avoided Outcome: Progressing   Problem: Activity: Goal: Risk for activity intolerance will decrease Outcome: Progressing   Problem: Nutrition: Goal: Adequate nutrition will be maintained Outcome: Progressing   Problem: Coping: Goal: Level of anxiety will decrease Outcome: Progressing   Problem: Elimination: Goal: Will not experience complications related to bowel motility Outcome: Progressing   Problem: Pain Managment: Goal: General experience of comfort will improve Outcome: Progressing   Problem: Safety: Goal: Ability to remain free from injury will improve Outcome: Progressing   Problem: Skin Integrity: Goal: Risk for impaired skin integrity will decrease Outcome: Progressing

## 2021-12-05 NOTE — Discharge Summary (Signed)
Physician Discharge Summary  [name] GGE:[id number] DOB: 14-Aug-1935 DOA: 11/23/2021  PCP: Merryl Hacker, No  Admit date: 11/23/2021 Discharge date: 12/05/2021  Admitted From: home  Disposition:  SNF   Recommendations for Outpatient Follow-up:  Follow up with PCP in 1-2 weeks F/u w/ urology, Dr. Bernardo Heater, in 1 week for likely cystoscopy & foley removal  F/u w/ ortho surg, Dr. Rudene Christians, in 1-2 weeks    Home Health: no  Equipment/Devices: foley (urology must remove foley)  Discharge Condition: stable  CODE STATUS: DNR  Diet recommendation: Soft diet: heart healthy   Brief/Interim Summary: HPI was taken from Dr. Francine Graven: [name] is a 86 y.o. male with medical history significant for chronic combined systolic and diastolic dysfunction CHF, stage III chronic kidney disease, coronary artery disease as well as COPD, bipolar disorder, history of prostate cancer treated with radiation therapy 1994, status post penile prosthesis in 2010 with revision in 2014 who presents to the ER via EMS for evaluation difficulty voiding over the last 24 hours. Patient states that he had an episode of gross hematuria on the day of his admission.  He also complained of severe suprapubic pain that he rated a 10 x 10 in intensity at its worst. He was seen in the ER and had a bladder scan which showed greater than 669 mL of urine. The ER staff attempted to place a Foley catheter without success so urology was consulted and was able to place a Foley catheter in the ER with drainage of purulent urine. He has had chills at home but denies having any fever.  He has had episodes of nausea and vomiting.  He also has lower extremity swelling.  He denies having any cough, no chest pain, no shortness of breath, no dizziness, no lightheadedness, no headache, no changes in his bowel habits, no blurred vision, no focal deficits. Sodium 130, potassium 4.7, chloride 101, bicarb 22, glucose 87, BUN 20, creatinine 2.10, calcium 8.4, lactic  acid 2.5, white count 8.7, hemoglobin 9.8, hematocrit 30.8, MCV 83.5, RDW 15.1, platelet count 309, PT 17.0, INR 1.4 Urine analysis shows pyuria Renal stone CT shows no acute abnormality in the abdomen or pelvis.  No hydronephrosis.  Bilateral renal cysts. Twelve-lead EKG reviewed by me shows normal sinus rhythm     ED Course: Patient is an 86 year old male who presents to the ER for evaluation of acute urinary retention. Attempts were made by the ER staff to place a Foley catheter but they were unsuccessful and so urology was consulted and he was able to place a 12 [place] coud catheter.  Patient noted to have purulent urine and previous urine culture yielded ampicillin resistant Enterococcus faecalis. Patient has a low-grade fever with a T-max of 99.1, he is tachycardic, has relative hypotension and lactic acid is elevated at 2.5. He will be admitted to the hospital for further evaluation.  As per Dr. Kurtis Bushman:  [name] is a 86 y.o. male with PMH significant for HTN, HLD, PAD, CAD s/p stents, chronic combined systolic and diastolic CHF COPD, chronic smoking, chronic alcoholism, sleep apnea bipolar disorder, degenerative joint disease, history of prostate cancer treated with radiation therapy 1994, status post penile prosthesis in 2010 with revision in 2014.   Patient presented to the ED on 12/7 with complaint difficulty voiding for 24 hours. On 12/6, he felt difficulty voiding despite feeling that his bladder was full.  Next morning on 12/7, he apparently tried to put in a metal object through his urethra  to relieve any 'obstruction'. He was able to urinate but it was grossly bloody.  He had severe suprapubic pain 10 out of 10 and hence was brought to the ED by EMS.   In the ED, bladder scan showed more than 669 mL of urine.  Urology had to be called for Foley catheter placement that drained purulent urine. CT renal study was obtained which did not show any acute abnormality in the abdomen  pelvis, specifically no hydronephrosis. Admitted to hospitalist service. Urology was consulted and they placed foley.    12/13 c/o Lt hip pain this am. RUE swollen more than baseline. D/w nursing to change IV line to the other arm. Xr hip reviewed and discussed with orthopedics, Dr. Posey Pronto.   As per Dr. Jimmye [place] 12/14-12/19/22: Pt abxs to changed to po linezolid to complete 2 week course as per ID. Of note, pt will continue w/ foley and f/u outpatient w/ urology, Dr. Bernardo Heater, in 1 week for possible cystoscopy and foley removal. PT/OT had evaluated the pt and recommended SNF. Pt was d/c to SNF. For more information, please see previous progress/consult notes.     Discharge Diagnoses:  Principal Problem:   Sepsis secondary to UTI [facility]) Active Problems:   CAD (coronary artery disease), native coronary artery   Essential hypertension   COPD (chronic obstructive pulmonary disease) (HCC)   Anemia in stage 3b chronic kidney disease (HCC)   Hypothyroidism   Type 2 diabetes mellitus with diabetic chronic kidney disease ([place])   Pressure injury of skin   Sepsis: secondary to UTI & bacteremia. Continue on linezolid (started on 11/29/21) x 2 weeks as per ID. Sepsis resolved   Bacteremia: blood cxs growing enterococcus faecium. Likely secondary to UTI. Continue on linezolid x 2 weeks as per ID   UTI: likely traumatic catherization contributing to it.  Urine cx growing enterococcus faecium. Continue on linezolid x  2 weeks as per ID. Has penile prosthesis was closely to rule out any hardware infection   Acute urinary retention: w/ hx of BPH. Apparently pt tried to relieve the 'obstruction' by inserting a metal rod through the urethra.  After that he started having hematuria and severe suprapubic pain.  CT renal no acute issues in abdomen and pelvis. Continue on foley x 7 days as per urology (foley placed on 11/23/21). Office follow-up for cystoscopy and uro (Dr. Bernardo Heater) wants the foley to continue until  pt can f/u outpatient  Dementia: as per pt's daughter. Continue on seoquel and w/ supportive    AKI on CKD stage IIIb: Cr is trending down from day prior  Hyponatremia: resolved   Likely ACD: likely secondary to CKD. 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  Needs removal of hardware and conversion to total hip arthroplasty but surgery would likely be done as an outpatient as per ortho surg. F/u w/ ortho surg, Dr. Rudene Christians, in 1-2 weeks    RUE swelling: [place] of RUE was neg for DVT     CAD: continue on aspirin, plavix & statin   Hx of chronic combined CHF: echo from 2020 with EF 45 to 50%, severely increased LV wall thickness. Appears euvolemic. Monitor I/Os   COPD: w/o exacerbation. Continue on bronchodilators    Bipolar disorder: continue on seroquel   Discharge Instructions  Discharge Instructions     Diet - low sodium heart healthy   Complete  by: As directed    Soft diet   Discharge instructions   Complete by: As directed    F/u w/ PCP in 1-2 weeks. F/u urology, Dr. Bernardo Heater, for possibly cystoscopy and removal of foley.   Discharge wound care:   Complete by: As directed    Silicone foam dressings to the right thigh change every 3 days. Assess under dressings each shift for any acute changes in the wounds.   Increase activity slowly   Complete by: As directed       Allergies as of 12/05/2021       Reactions   Chlorthalidone Other (See Comments)   Hyponatremia   Ace Inhibitors Cough   Hydrochlorothiazide    Lisinopril Other (See Comments)   Losartan    Other Other (See Comments)   "ANY BLOOD PRESSURE MEDICATIONS THAT I'VE TRIED" - PT. DOES NOT REMEMBER WHICH ONES "ANY BLOOD PRESSURE MEDICATIONS THAT I'VE TRIED"- PT. DOES NOT REMEMBER WHICH ONES- CONSTIPATION   Benazepril-hydrochlorothiazide Other (See Comments)   Constipation        Medication List      TAKE these medications    Adult Aspirin Regimen 81 MG EC tablet Generic drug: aspirin TAKE 1 TABLET BY MOUTH DAILY   albuterol 108 (90 Base) MCG/ACT inhaler Commonly known as: VENTOLIN HFA USE 2 PUFFS EVERY 4 HOURS AS NEEDED   atorvastatin 40 MG tablet Commonly known as: LIPITOR Take 40 mg by mouth daily.   azelastine 0.1 % nasal spray Commonly known as: ASTELIN Place 1 spray into both nostrils daily as needed for allergies.   calcium carbonate 1500 (600 Ca) MG Tabs tablet Commonly known as: OSCAL Take 1 tablet (1,500 mg total) by mouth 2 (two) times daily with a meal.   carvedilol 6.25 MG tablet Commonly known as: COREG TAKE 1 TABLET BY MOUTH TWICE A DAY What changed: when to take this   clopidogrel 75 MG tablet Commonly known as: PLAVIX Take 1 tablet (75 mg total) by mouth daily.   feeding supplement Liqd Take 237 mLs by mouth 3 (three) times daily between meals.   fluticasone 50 MCG/ACT nasal spray Commonly known as: FLONASE Place 2 sprays into both nostrils daily.   furosemide 40 MG tablet Commonly known as: LASIX TAKE 1 TABLET BY MOUTH DAILY AND TAKE ANADDITIONAL TABLET IN THE AFTERNOON AS NEEDED FOR WEIGHT GAIN AND LEG EDEMA   ipratropium 0.03 % nasal spray Commonly known as: ATROVENT Place 2 sprays into both nostrils daily.   isosorbide mononitrate 30 MG 24 hr tablet Commonly known as: IMDUR Take 30 mg by mouth daily.   linezolid 600 MG tablet Commonly known as: ZYVOX Take 1 tablet (600 mg total) by mouth every 12 (twelve) hours for 8 days.   lubiprostone 8 MCG capsule Commonly known as: AMITIZA Take 8 mcg by mouth 2 (two) times daily.   Magnesium Oxide 500 MG Caps Take 1 capsule (500 mg total) by mouth daily.   nitroGLYCERIN 0.4 MG SL tablet Commonly known as: NITROSTAT Take 0.4 mg by mouth every 5 (five) minutes x 3 doses as needed for chest pain. As needed for chest pain   oxyCODONE 5 MG immediate release tablet Commonly known as: Oxy  IR/ROXICODONE Take 1 tablet (5 mg total) by mouth every 8 (eight) hours. Must last 30 days. What changed: Another medication with the same name was removed. Continue taking this medication, and follow the directions you see here.   QUEtiapine 50 MG tablet Commonly known as: SEROQUEL Take  50 mg by mouth at bedtime.   tamsulosin 0.4 MG Caps capsule Commonly known as: FLOMAX Take 0.4 mg by mouth daily.   Trelegy Ellipta 200-62.5-25 MCG/ACT Aepb Generic drug: Fluticasone-Umeclidin-Vilant Inhale 1 puff into the lungs daily.               Discharge Care Instructions  (From admission, onward)           Start     Ordered   12/05/21 0000  Discharge wound care:       Comments: Silicone foam dressings to the right thigh change every 3 days. Assess under dressings each shift for any acute changes in the wounds.   12/05/21 1153            Follow-up Information     Stoioff, Ronda Fairly, MD Follow up in 1 week(s).   Specialty: Urology Contact information: [place] [address] [phone]                Allergies  Allergen Reactions   Chlorthalidone Other (See Comments)    Hyponatremia   Ace Inhibitors Cough   Hydrochlorothiazide    Lisinopril Other (See Comments)   Losartan    Other Other (See Comments)    "ANY BLOOD PRESSURE MEDICATIONS THAT I'VE TRIED" - PT. DOES NOT REMEMBER WHICH ONES "ANY BLOOD PRESSURE MEDICATIONS THAT I'VE TRIED"- PT. DOES NOT REMEMBER WHICH ONES- CONSTIPATION   Benazepril-Hydrochlorothiazide Other (See Comments)    Constipation    Consultations: Ortho surg  ID   Procedures/Studies: US Venous Img Upper Uni Right(DVT)  Result Date: 11/29/2021 CLINICAL DATA:  Right arm swelling EXAM: RIGHT UPPER EXTREMITY VENOUS DOPPLER ULTRASOUND TECHNIQUE: Gray-scale sonography with graded compression, as well as color Doppler and duplex ultrasound were performed to evaluate the upper extremity deep venous system  from the level of the subclavian vein and including the jugular, axillary, basilic, radial, ulnar and upper cephalic vein. Spectral Doppler was utilized to evaluate flow at rest and with distal augmentation maneuvers. COMPARISON:  None. FINDINGS: Contralateral Subclavian Vein: Respiratory phasicity is normal and symmetric with the symptomatic side. No evidence of thrombus. Normal compressibility. Internal Jugular Vein: No evidence of thrombus. Normal compressibility, respiratory phasicity and response to augmentation. Subclavian Vein: No evidence of thrombus. Normal compressibility, respiratory phasicity and response to augmentation. Axillary Vein: No evidence of thrombus. Normal compressibility, respiratory phasicity and response to augmentation. Cephalic Vein: No evidence of thrombus. Normal compressibility, respiratory phasicity and response to augmentation. Basilic Vein: No evidence of thrombus. Normal compressibility, respiratory phasicity and response to augmentation. Brachial Veins: No evidence of thrombus. Normal compressibility, respiratory phasicity and response to augmentation. Radial Veins: No evidence of thrombus. Normal compressibility, respiratory phasicity and response to augmentation. Ulnar Veins: No evidence of thrombus. Normal compressibility, respiratory phasicity and response to augmentation. IMPRESSION: No evidence of DVT within the right upper extremity. Electronically Signed   By: Jerilynn Mages.  Shick M.D.   On: 11/29/2021 13:57   DG Chest Portable 1 View  Result Date: 11/23/2021 CLINICAL DATA:  Weakness, evaluate for edema EXAM: PORTABLE CHEST 1 VIEW COMPARISON:  Chest radiograph 06/20/2021 FINDINGS: The heart is enlarged. The upper mediastinal contours are within normal limits. There are diffusely increased interstitial markings bilaterally likely reflecting mild pulmonary interstitial edema. There is no focal consolidation. There is no pleural effusion or pneumothorax There is no acute osseous  abnormality. IMPRESSION: Cardiomegaly with mild pulmonary interstitial edema. Electronically Signed   By: Valetta Mole M.D.   On:  11/23/2021 11:44   CT Renal Stone Study  Result Date: 11/23/2021 CLINICAL DATA:  Flank pain, kidney stone suspected. Urinary retention. EXAM: CT ABDOMEN AND PELVIS WITHOUT CONTRAST TECHNIQUE: Multidetector CT imaging of the abdomen and pelvis was performed following the standard protocol without IV contrast. COMPARISON:  CT abdomen and pelvis 08/05/2017 FINDINGS: Lower chest: Mild bibasilar lung scarring. No pleural effusion. Three-vessel coronary atherosclerosis. Hepatobiliary: No focal liver abnormality is seen. No gallstones, gallbladder wall thickening, or biliary dilatation. Pancreas: Unremarkable. Spleen: Calcified granulomas. Adrenals/Urinary Tract: Unremarkable adrenal glands. Bilateral low-density renal lesions compatible with cysts measuring up to 5.1 cm on the right and 4.4 cm on the left with some of the cysts having at most mildly enlarged since 2018. No renal calculi or hydronephrosis. Vascular calcifications in both renal hila. No ureteral calculi or ureteral dilatation. Foley catheter in the bladder which is largely collapsed limiting evaluation. Stomach/Bowel: The stomach is unremarkable. There is no evidence of bowel obstruction or inflammation. Predominantly left-sided colonic diverticulosis is noted without evidence of acute diverticulitis. The appendix is unremarkable. Vascular/Lymphatic: Abdominal aortic atherosclerosis without aneurysm. No enlarged lymph nodes. Reproductive: Partially visualized penile implant with reservoir in the left pelvis. Unremarkable prostate. Other: No ascites or pneumoperitoneum. Musculoskeletal: Right hip arthroplasty. ORIF of the left femur with extensive erosion/resorption of the left femoral head, largely new from 2018 but similar in appearance to 02/11/2021 pelvic radiographs. IMPRESSION: 1. No acute abnormality identified in the  abdomen or pelvis. No hydronephrosis. 2. Bilateral renal cysts. 3. Colonic diverticulosis. 4. Aortic Atherosclerosis (ICD10-I70.0). Electronically Signed   By: Logan Bores M.D.   On: 11/23/2021 11:19   DG HIP UNILAT WITH PELVIS 2-3 VIEWS LEFT  Result Date: 11/28/2021 CLINICAL DATA:  Left hip pain after fall several months ago EXAM: DG HIP (WITH OR WITHOUT PELVIS) 2-3V LEFT COMPARISON:  01/08/2021, 11/23/2021 FINDINGS: Frontal view of the pelvis as well as frontal and frogleg lateral views of the left hip are obtained. Right hip arthroplasty is partially visualized, unchanged in appearance. Penile prosthesis again noted. There is a plate and screw fixation across the proximal left femur again noted. There has been progressive erosive change of the left femoral head and neck, as well as the left acetabulum. 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 There is concern for underlying infection, hip aspiration could be performed. There are no acute displaced fractures. The remainder of the bony pelvis is unremarkable. IMPRESSION: 1. 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 Given the associated left hip effusion seen on prior CT and MRI, aspiration could be performed if septic arthritis is a concern. 2. Stable right hip arthroplasty. 3. No acute fracture. These results will be called to the ordering clinician or representative by the Radiologist Assistant, and communication documented in the PACS or [employer]. Electronically Signed   By: Randa Ngo M.D.   On: 11/28/2021 16:37   (Echo, Carotid, EGD, Colonoscopy, ERCP)    Subjective: Pt c/o fatigue    Discharge Exam: Vitals:   12/05/21 0324 12/05/21 0739  BP: (!) 147/67 135/62  Pulse: 64 73  Resp: 20 16  Temp: 98.7 F (37.1 C) 97.9 F (36.6 C)  SpO2: 99% 96%   Vitals:    12/04/21 1603 12/04/21 2037 12/05/21 0324 12/05/21 0739  BP: (!) 141/97 140/66 (!) 147/67 135/62  Pulse: 63 73 64 73  Resp: 16 20 20  16  Temp: 97.9 F (36.6 C) 97.6 F (36.4 C) 98.7 F (37.1 C) 97.9 F (36.6 C)  TempSrc: Oral Oral Oral Oral  SpO2: 98% 99% 99% 96%  Weight:      Height:        General: Pt is alert, awake, not in acute distress Cardiovascular: S1/S2 +, no rubs, no gallops Respiratory: CTA bilaterally, no wheezing, no rhonchi Abdominal: Soft, NT, obese, bowel sounds + Extremities: no cyanosis    The results of significant diagnostics from this hospitalization (including imaging, microbiology, ancillary and laboratory) are listed below for reference.     Microbiology: Recent Results (from the past 240 hour(s))  CULTURE, BLOOD (ROUTINE X 2) w Reflex to ID Panel     Status: None   Collection Time: 11/26/21  6:39 AM   Specimen: BLOOD  Result Value Ref Range Status   Specimen Description BLOOD LEFT WRIST  Final   Special Requests   Final    BOTTLES DRAWN AEROBIC AND ANAEROBIC Blood Culture adequate volume   Culture   Final    NO GROWTH 5 DAYS Performed at [facility], [address] [place], [place] [address]    Report Status 12/01/2021 FINAL  Final  CULTURE, BLOOD (ROUTINE X 2) w Reflex to ID Panel     Status: None   Collection Time: 11/26/21  6:43 AM   Specimen: BLOOD  Result Value Ref Range Status   Specimen Description BLOOD LEFT HAND  Final   Special Requests   Final    BOTTLES DRAWN AEROBIC AND ANAEROBIC Blood Culture adequate volume   Culture   Final    NO GROWTH 5 DAYS Performed at [facility], [address] [place], [place] [address]    Report Status 12/01/2021 FINAL  Final     Labs: BNP (last 3 results) Recent Labs    06/20/21 0803 08/26/21 1545  BNP 147.7* 354.5*   Basic Metabolic Panel: Recent Labs  Lab 12/01/21 0513 12/02/21 0731 12/03/21 0457 12/04/21 0303 12/05/21 0333  NA 137 134* 136 132* 135  K  4.7 4.6 4.3 4.4 4.5  CL 107 107 107 105 108  CO2 23 21* 22 20* 21*  GLUCOSE 80 75 91 84 90  BUN 27* 25* 24* 21 19  CREATININE 2.18* 2.16* 2.20* 2.20* 2.17*  CALCIUM 7.7* 7.8* 7.9* 8.1* 7.9*   Liver Function Tests: No results for input(s): AST, ALT, ALKPHOS, BILITOT, PROT, ALBUMIN in the last 168 hours. No results for input(s): LIPASE, AMYLASE in the last 168 hours. No results for input(s): AMMONIA in the last 168 hours. CBC: Recent Labs  Lab 12/01/21 0513 12/02/21 0731 12/03/21 0457 12/04/21 0303 12/05/21 0333  WBC 7.7 9.1 9.2 7.3 5.8  HGB 7.8* 8.3* 8.6* 7.8* 7.8*  HCT 23.8* 25.6* 24.9* 23.8* 24.0*  MCV 81.8 82.1 80.8 81.0 82.2  PLT 225 247 289 308 319   Cardiac Enzymes: No results for input(s): CKTOTAL, CKMB, CKMBINDEX, TROPONINI in the last 168 hours. BNP: Invalid input(s): POCBNP CBG: No results for input(s): GLUCAP in the last 168 hours. D-Dimer No results for input(s): DDIMER in the last 72 hours. Hgb A1c No results for input(s): HGBA1C in the last 72 hours. Lipid Profile No results for input(s): CHOL, HDL, LDLCALC, TRIG, CHOLHDL, LDLDIRECT in the last 72 hours. Thyroid function studies No results for input(s): TSH, T4TOTAL, T3FREE, THYROIDAB in the last 72 hours.  Invalid input(s): FREET3 Anemia work up No results for input(s): VITAMINB12, FOLATE, FERRITIN, TIBC, IRON,  RETICCTPCT in the last 72 hours. Urinalysis    Component Value Date/Time   COLORURINE AMBER (A) 11/23/2021 0741   APPEARANCEUR HAZY (A) 11/23/2021 0741   APPEARANCEUR Clear 06/14/2018 1341   LABSPEC 1.015 11/23/2021 0741   LABSPEC 1.004 04/03/2015 1216   PHURINE 7.0 11/23/2021 0741   GLUCOSEU NEGATIVE 11/23/2021 0741   GLUCOSEU Negative 04/03/2015 1216   HGBUR LARGE (A) 11/23/2021 0741   BILIRUBINUR NEGATIVE 11/23/2021 0741   BILIRUBINUR Negative 06/14/2018 1341   BILIRUBINUR Negative 04/03/2015 1216   KETONESUR NEGATIVE 11/23/2021 0741   PROTEINUR 100 (A) 11/23/2021 0741    UROBILINOGEN 0.2 03/27/2011 0845   NITRITE NEGATIVE 11/23/2021 0741   LEUKOCYTESUR LARGE (A) 11/23/2021 0741   LEUKOCYTESUR Negative 04/03/2015 1216   Sepsis Labs Invalid input(s): PROCALCITONIN,  WBC,  LACTICIDVEN Microbiology Recent Results (from the past 240 hour(s))  CULTURE, BLOOD (ROUTINE X 2) w Reflex to ID Panel     Status: None   Collection Time: 11/26/21  6:39 AM   Specimen: BLOOD  Result Value Ref Range Status   Specimen Description BLOOD LEFT WRIST  Final   Special Requests   Final    BOTTLES DRAWN AEROBIC AND ANAEROBIC Blood Culture adequate volume   Culture   Final    NO GROWTH 5 DAYS Performed at [facility], [address] [place], [place] [address]    Report Status 12/01/2021 FINAL  Final  CULTURE, BLOOD (ROUTINE X 2) w Reflex to ID Panel     Status: None   Collection Time: 11/26/21  6:43 AM   Specimen: BLOOD  Result Value Ref Range Status   Specimen Description BLOOD LEFT HAND  Final   Special Requests   Final    BOTTLES DRAWN AEROBIC AND ANAEROBIC Blood Culture adequate volume   Culture   Final    NO GROWTH 5 DAYS Performed at [facility], [address], [place], [place] [address]    Report Status 12/01/2021 FINAL  Final     Time coordinating discharge: Over 30 minutes  SIGNED:   Wyvonnia Dusky, MD  [facility] 12/05/2021, 11:54 AM Pager   If 7PM-7AM, please contact night-coverage

## 2021-12-05 NOTE — Care Management Important Message (Signed)
Important Message  Patient Details  Name: DELMON ANDRADA MRN: [id number] Date of Birth: 04-27-1935   Medicare Important Message Given:  Other (see comment)  I left a message for [place], [place], daughter at [phone] to call me back at her convenience so I could review the Important Message from [facility].   Juliann Pulse A Ledford Goodson 12/05/2021, 1:30 PM

## 2021-12-06 ENCOUNTER — Non-Acute Institutional Stay (SKILLED_NURSING_FACILITY): Payer: HMO | Admitting: Adult Health

## 2021-12-06 ENCOUNTER — Encounter: Payer: Self-pay | Admitting: Adult Health

## 2021-12-06 DIAGNOSIS — I25119 Atherosclerotic heart disease of native coronary artery with unspecified angina pectoris: Secondary | ICD-10-CM | POA: Diagnosis not present

## 2021-12-06 DIAGNOSIS — F319 Bipolar disorder, unspecified: Secondary | ICD-10-CM

## 2021-12-06 DIAGNOSIS — N1832 Chronic kidney disease, stage 3b: Secondary | ICD-10-CM

## 2021-12-06 DIAGNOSIS — R339 Retention of urine, unspecified: Secondary | ICD-10-CM

## 2021-12-06 DIAGNOSIS — A419 Sepsis, unspecified organism: Secondary | ICD-10-CM

## 2021-12-06 DIAGNOSIS — M25552 Pain in left hip: Secondary | ICD-10-CM

## 2021-12-06 DIAGNOSIS — N390 Urinary tract infection, site not specified: Secondary | ICD-10-CM

## 2021-12-06 DIAGNOSIS — F1097 Alcohol use, unspecified with alcohol-induced persisting dementia: Secondary | ICD-10-CM

## 2021-12-06 DIAGNOSIS — J418 Mixed simple and mucopurulent chronic bronchitis: Secondary | ICD-10-CM

## 2021-12-06 DIAGNOSIS — D631 Anemia in chronic kidney disease: Secondary | ICD-10-CM

## 2021-12-06 NOTE — Progress Notes (Signed)
Location:  [facility] Room Number: 213-A Place of Service:  SNF (31) Provider:  Durenda Age, DNP, FNP-BC  Patient Care Team: Pcp, No as PCP - General Nicholas Hampshire, MD as PCP - Cardiology (Cardiology) Nicholas Graff, FNP as Nurse Practitioner (Family Medicine) Nicholas Hampshire, MD as Consulting Physician (Cardiology) Nicholas Pian, MD as Referring Physician (Specialist)  Extended Emergency Contact Information Primary Emergency Contact: [name] Mobile Phone: [phone] Relation: Daughter Secondary Emergency Contact: [name] [place] of [place] Phone: [phone] Relation: Daughter  Code Status:  DNR  Goals of care: Advanced Directive information Advanced Directives 12/06/2021  Does Patient Have a Medical Advance Directive? Yes  Type of Paramedic of [place];Out of facility DNR (pink MOST or yellow form)  Does patient want to make changes to medical advance directive? No - Patient declined  Copy of [place] in Chart? Yes - validated most recent copy scanned in chart (See row information)  Would patient like information on creating a medical advance directive? -  Pre-existing out of facility DNR order (yellow form or pink MOST form) Yellow form placed in chart (order not valid for inpatient use)     Chief Complaint  Patient presents with   Hospitalization Follow-up    Follow-up from recent hospital stay 11/23/21-12/05/21    HPI:  Pt is a 86 y.o. male who was admitted to [facility] on/19/22 post hospital admission 11/23/2021 to 12/05/2021.  He has a PMH of chronic combined systolic and diastolic dysfunction CHF, stage III chronic kidney disease, coronary artery disease, COPD, bipolar disorder, history of prostate cancer treated with radiation therapy in 1994 and S/P penile prosthesis in 2010 with revision in 2014.  He presented to ER with EMS  for evaluation of difficulty voiding over 24 hours.  He apparently tried to relieve the 'obstruction' by inserting a metal rod through the urethra.  After that he started having hematuria and severe suprapubic pain. Bladder scan was done in the ER which showed greater than 669 mL of urine.  CT renal study showed no acute abnormality.ER staff attempted to place a Foley catheter without success so urology was consulted and was able to place a Foley catheter in the ER with drainage of purulent urine.  He has had chills, nausea and vomiting at home but denies having fever.  Urine and blood culture grew Enterococcus faecium.  ID recommended linezolid x2 weeks.  Hospitalization was complicated by left hip pain.  X-ray showed progressive bony destruction of the left hip joint which could reflect progressive particle disease.  Orthopedics recommended removal of hardware and conversion to total hip arthroplasty.  He was seen in his room today. He stated that he has been walking at home with walker.Speech therapist obtained a BIMS score of 10/15, ranging in moderate cognitive impairment.    Past Medical History:  Diagnosis Date   Alcoholism ([place])    Asthma    Bipolar affective disorder ([place])    Chronic combined systolic and diastolic CHF (congestive heart failure) ([place])    a. 08/2016 Echo: EF 40-45%, mild AS, mild to mod MR, mildly dil LA/RA, mild-mod TR; b. 07/2017 Echo: EF 40-45%, mod LVH, Gr1 DD, mild to mod AS, mildly dil LA, nl RV fxn; c. 03/2019 Echo: EF 45-50%, AS (not severe). Mod dil PA.   CKD (chronic kidney disease), stage III (HCC)    COPD (chronic obstructive pulmonary disease) (HCC)    Coronary  artery disease    a. 01/2004 s/p PCI and Taxus DES to dRCA (3.5 x 12 mm); b. 07/2017 Lexiscan MV: no ischemia. Sm area of apicl thinning, likely attenuation. EF 33% (GI uptake noted)-->Low risk; c. 03/2019 Inf STEMI/PCI: LM nl, LAD min irregs, D1 20ost, RI 20ost, LCX nl, RCA 90p/60m (4.0x26 Resolute Onyx DES),  44m (4.0x15 Resolute Onyx DES), 10d ISR.   Degenerative joint disease    knees and hip   Dyspnea    on exertion   Essential hypertension    Hyperlipidemia    Hypertension    controlled on meds   Ischemic cardiomyopathy    a. 08/2016 Echo: EF 40-45%; b. 07/2017 Echo: EF 40-45%; c. 03/2019 Echo: EF 45-50%.   OSA (obstructive sleep apnea)    Prostate CA (HCC)    prostate ca dx 20 yrs ago   PVD (peripheral vascular disease) ([place])    Tobacco abuse    Past Surgical History:  Procedure Laterality Date   CARDIAC CATHETERIZATION     CORONARY ANGIOGRAPHY N/A 04/01/2019   Procedure: CORONARY ANGIOGRAPHY;  Surgeon: Nicholas Bush, MD;  Location: [facility];  Service: Cardiovascular;  Laterality: N/A;   CORONARY ANGIOPLASTY WITH STENT PLACEMENT     x2   CORONARY/GRAFT ACUTE MI REVASCULARIZATION N/A 04/01/2019   Procedure: Coronary/Graft Acute MI Revascularization;  Surgeon: Nicholas Bush, MD;  Location: [facility];  Service: Cardiovascular;  Laterality: N/A;   TOTAL HIP ARTHROPLASTY     right    Allergies  Allergen Reactions   Chlorthalidone Other (See Comments)    Hyponatremia   Ace Inhibitors Cough   Hydrochlorothiazide    Lisinopril Other (See Comments)   Losartan    Other Other (See Comments)    "ANY BLOOD PRESSURE MEDICATIONS THAT I'VE TRIED" - PT. DOES NOT REMEMBER WHICH ONES "ANY BLOOD PRESSURE MEDICATIONS THAT I'VE TRIED"- PT. DOES NOT REMEMBER WHICH ONES- CONSTIPATION   Benazepril-Hydrochlorothiazide Other (See Comments)    Constipation    Outpatient Encounter Medications as of 12/06/2021  Medication Sig   ADULT ASPIRIN REGIMEN 81 MG EC tablet TAKE 1 TABLET BY MOUTH DAILY   albuterol (VENTOLIN HFA) 108 (90 Base) MCG/ACT inhaler USE 2 PUFFS EVERY 4 HOURS AS NEEDED   atorvastatin (LIPITOR) 40 MG tablet Take 40 mg by mouth daily.   azelastine (ASTELIN) 0.1 % nasal spray Place 1 spray into both nostrils daily as needed for allergies.   calcium carbonate  (OSCAL) 1500 (600 Ca) MG TABS tablet Take 1 tablet (1,500 mg total) by mouth 2 (two) times daily with a meal.   carvedilol (COREG) 6.25 MG tablet TAKE 1 TABLET BY MOUTH TWICE A DAY (Patient taking differently: Take 6.25 mg by mouth 2 (two) times daily with a meal.)   clopidogrel (PLAVIX) 75 MG tablet Take 1 tablet (75 mg total) by mouth daily.   feeding supplement (ENSURE ENLIVE / ENSURE PLUS) LIQD Take 237 mLs by mouth 3 (three) times daily between meals.   fluticasone (FLONASE) 50 MCG/ACT nasal spray Place 2 sprays into both nostrils daily.   furosemide (LASIX) 40 MG tablet TAKE 1 TABLET BY MOUTH DAILY AND TAKE ANADDITIONAL TABLET IN THE AFTERNOON AS NEEDED FOR WEIGHT GAIN AND LEG EDEMA   ipratropium (ATROVENT) 0.03 % nasal spray Place 2 sprays into both nostrils daily.   isosorbide mononitrate (IMDUR) 30 MG 24 hr tablet Take 30 mg by mouth daily.   lubiprostone (AMITIZA) 8 MCG capsule Take 8 mcg by mouth 2 (two) times daily.  Magnesium Oxide 500 MG CAPS Take 1 capsule (500 mg total) by mouth daily.   nitroGLYCERIN (NITROSTAT) 0.4 MG SL tablet Take 0.4 mg by mouth every 5 (five) minutes x 3 doses as needed for chest pain. As needed for chest pain   oxyCODONE (OXY IR/ROXICODONE) 5 MG immediate release tablet Take 1 tablet (5 mg total) by mouth every 8 (eight) hours. Must last 30 days.   QUEtiapine (SEROQUEL) 50 MG tablet Take 50 mg by mouth at bedtime.   tamsulosin (FLOMAX) 0.4 MG CAPS capsule Take 0.4 mg by mouth daily.   TRELEGY ELLIPTA 200-62.5-25 MCG/INH AEPB Inhale 1 puff into the lungs daily.    [DISCONTINUED] Cholecalciferol (VITAMIN D3) 125 MCG (5000 UT) CAPS Take 1 capsule (5,000 Units total) by mouth daily with breakfast. Take along with calcium and magnesium. (Patient not taking: Reported on 01/27/2021)   [DISCONTINUED] linezolid (ZYVOX) 600 MG tablet Take 1 tablet (600 mg total) by mouth every 12 (twelve) hours for 8 days.   No facility-administered encounter medications on file as of  12/06/2021.    Review of Systems  GENERAL: No change in appetite, no fatigue, no weight changes MOUTH and THROAT: Denies oral discomfort, gingival pain or bleeding RESPIRATORY: no cough, SOB, DOE, wheezing, hemoptysis CARDIAC: No chest pain, edema or palpitations GI: No abdominal pain, diarrhea, constipation, heart burn, nausea or vomiting GU: Denies dysuria, frequency, hematuria, incontinence, or discharge NEUROLOGICAL: Denies dizziness, syncope, numbness, or headache PSYCHIATRIC: Denies feelings of depression or anxiety. No report of hallucinations, insomnia, paranoia, or agitation   Immunization History  Administered Date(s) Administered   Influenza,inj,Quad PF,6+ Mos 09/01/2016, 08/29/2017   Pertinent  Health Maintenance Due  Topic Date Due   FOOT EXAM  Never done   OPHTHALMOLOGY EXAM  Never done   URINE MICROALBUMIN  Never done   INFLUENZA VACCINE  07/18/2021   HEMOGLOBIN A1C  12/30/2021   Fall Risk 12/03/2021 12/03/2021 12/04/2021 12/04/2021 12/05/2021  Falls in the past year? - - - - -  Was there an injury with Fall? - - - - -  Fall Risk Category Calculator - - - - -  Fall Risk Category - - - - -  Patient Fall Risk Level High fall risk High fall risk High fall risk High fall risk High fall risk  Patient at Risk for Falls Due to - - - - -  Fall risk Follow up - - - - -     Vitals:   12/06/21 1008  BP: (!) 143/63  Pulse: 66  Resp: 18  Temp: 97.7 F (36.5 C)  SpO2: 94%  Weight: 250 lb (113.4 kg)  Height: 5\' 7"  (1.702 m)   Body mass index is 39.16 kg/m.  Physical Exam  GENERAL APPEARANCE: Well nourished. In no acute distress.  Morbidly obese SKIN:  Skin is warm and dry.  MOUTH and THROAT: Lips are without lesions. Oral mucosa is moist and without lesions.  RESPIRATORY: Breathing is even & unlabored, BS CTAB CARDIAC: RRR, no murmur,no extra heart sounds, no edema GI: Abdomen soft, normal BS, no masses, no tenderness GU:  has foley catheter french 12  draining clear yellowish urine NEUROLOGICAL: There is no tremor. Speech is clear. Alert to self and time, disoriented to place. PSYCHIATRIC:  Affect and behavior are appropriate  Labs reviewed: Recent Labs    01/10/21 0245 01/13/21 0718 02/15/21 0511 02/16/21 0457 06/20/21 0803 11/24/21 0509 11/25/21 0340 12/03/21 0457 12/04/21 0303 12/05/21 0333  NA 140   < > 134*  135   < > 133*   < > 136 132* 135  K 3.8   < > 4.8 4.8   < > 4.4   < > 4.3 4.4 4.5  CL 106   < > 103 103   < > 99   < > 107 105 108  CO2 24   < > 25 25   < > 22   < > 22 20* 21*  GLUCOSE 98   < > 91 92   < > 82   < > 91 84 90  BUN 33*   < > 46* 48*   < > 24*   < > 24* 21 19  CREATININE 2.31*   < > 2.18* 2.16*   < > 2.35*   < > 2.20* 2.20* 2.17*  CALCIUM 8.2*   < > 8.4* 8.4*   < > 8.2*   < > 7.9* 8.1* 7.9*  MG 2.1   < > 2.0 2.0  --  1.5*  --   --   --   --   PHOS 4.6  --   --   --   --   --   --   --   --   --    < > = values in this interval not displayed.   Recent Labs    02/11/21 0946 02/12/21 0537 06/20/21 1025 09/02/21 1418  AST 33 28 18  --   ALT 55* 43 9  --   ALKPHOS 111 97 123  --   BILITOT 1.1 0.7 0.6  --   PROT 6.9 6.1* 8.0  --   ALBUMIN 2.9* 2.5* 3.5 3.2*   Recent Labs    11/23/21 0518 11/24/21 0509 11/25/21 0340 11/26/21 0633 11/29/21 0643 12/03/21 0457 12/04/21 0303 12/05/21 0333  WBC 8.7   < > 16.0* 10.7*   < > 9.2 7.3 5.8  NEUTROABS 6.2  --  12.5* 8.3*  --   --   --   --   HGB 9.8*   < > 8.5* 8.4*   < > 8.6* 7.8* 7.8*  HCT 30.8*   < > 25.7* 25.8*   < > 24.9* 23.8* 24.0*  MCV 83.5   < > 82.4 82.7   < > 80.8 81.0 82.2  PLT 309   < > 209 186   < > 289 308 319   < > = values in this interval not displayed.   Lab Results  Component Value Date   TSH 1.388 02/11/2021   Lab Results  Component Value Date   HGBA1C 6.1 (H) 06/29/2021   Lab Results  Component Value Date   CHOL 105 08/26/2021   HDL 38 (L) 08/26/2021   LDLCALC 52 08/26/2021   TRIG 70 08/26/2021   CHOLHDL 2.8  08/26/2021    Significant Diagnostic Results in last 30 days:  US Venous Img Upper Uni Right(DVT)  Result Date: 11/29/2021 CLINICAL DATA:  Right arm swelling EXAM: RIGHT UPPER EXTREMITY VENOUS DOPPLER ULTRASOUND TECHNIQUE: Gray-scale sonography with graded compression, as well as color Doppler and duplex ultrasound were performed to evaluate the upper extremity deep venous system from the level of the subclavian vein and including the jugular, axillary, basilic, radial, ulnar and upper cephalic vein. Spectral Doppler was utilized to evaluate flow at rest and with distal augmentation maneuvers. COMPARISON:  None. FINDINGS: Contralateral Subclavian Vein: Respiratory phasicity is normal and symmetric with the symptomatic side. No evidence of thrombus. Normal compressibility. Internal Jugular Vein:  No evidence of thrombus. Normal compressibility, respiratory phasicity and response to augmentation. Subclavian Vein: No evidence of thrombus. Normal compressibility, respiratory phasicity and response to augmentation. Axillary Vein: No evidence of thrombus. Normal compressibility, respiratory phasicity and response to augmentation. Cephalic Vein: No evidence of thrombus. Normal compressibility, respiratory phasicity and response to augmentation. Basilic Vein: No evidence of thrombus. Normal compressibility, respiratory phasicity and response to augmentation. Brachial Veins: No evidence of thrombus. Normal compressibility, respiratory phasicity and response to augmentation. Radial Veins: No evidence of thrombus. Normal compressibility, respiratory phasicity and response to augmentation. Ulnar Veins: No evidence of thrombus. Normal compressibility, respiratory phasicity and response to augmentation. IMPRESSION: No evidence of DVT within the right upper extremity. Electronically Signed   By: Jerilynn Mages.  Shick M.D.   On: 11/29/2021 13:57   DG Chest Portable 1 View  Result Date: 11/23/2021 CLINICAL DATA:  Weakness, evaluate  for edema EXAM: PORTABLE CHEST 1 VIEW COMPARISON:  Chest radiograph 06/20/2021 FINDINGS: The heart is enlarged. The upper mediastinal contours are within normal limits. There are diffusely increased interstitial markings bilaterally likely reflecting mild pulmonary interstitial edema. There is no focal consolidation. There is no pleural effusion or pneumothorax There is no acute osseous abnormality. IMPRESSION: Cardiomegaly with mild pulmonary interstitial edema. Electronically Signed   By: Valetta Mole M.D.   On: 11/23/2021 11:44   CT Renal Stone Study  Result Date: 11/23/2021 CLINICAL DATA:  Flank pain, kidney stone suspected. Urinary retention. EXAM: CT ABDOMEN AND PELVIS WITHOUT CONTRAST TECHNIQUE: Multidetector CT imaging of the abdomen and pelvis was performed following the standard protocol without IV contrast. COMPARISON:  CT abdomen and pelvis 08/05/2017 FINDINGS: Lower chest: Mild bibasilar lung scarring. No pleural effusion. Three-vessel coronary atherosclerosis. Hepatobiliary: No focal liver abnormality is seen. No gallstones, gallbladder wall thickening, or biliary dilatation. Pancreas: Unremarkable. Spleen: Calcified granulomas. Adrenals/Urinary Tract: Unremarkable adrenal glands. Bilateral low-density renal lesions compatible with cysts measuring up to 5.1 cm on the right and 4.4 cm on the left with some of the cysts having at most mildly enlarged since 2018. No renal calculi or hydronephrosis. Vascular calcifications in both renal hila. No ureteral calculi or ureteral dilatation. Foley catheter in the bladder which is largely collapsed limiting evaluation. Stomach/Bowel: The stomach is unremarkable. There is no evidence of bowel obstruction or inflammation. Predominantly left-sided colonic diverticulosis is noted without evidence of acute diverticulitis. The appendix is unremarkable. Vascular/Lymphatic: Abdominal aortic atherosclerosis without aneurysm. No enlarged lymph nodes. Reproductive:  Partially visualized penile implant with reservoir in the left pelvis. Unremarkable prostate. Other: No ascites or pneumoperitoneum. Musculoskeletal: Right hip arthroplasty. ORIF of the left femur with extensive erosion/resorption of the left femoral head, largely new from 2018 but similar in appearance to 02/11/2021 pelvic radiographs. IMPRESSION: 1. No acute abnormality identified in the abdomen or pelvis. No hydronephrosis. 2. Bilateral renal cysts. 3. Colonic diverticulosis. 4. Aortic Atherosclerosis (ICD10-I70.0). Electronically Signed   By: Logan Bores M.D.   On: 11/23/2021 11:19   DG HIP UNILAT WITH PELVIS 2-3 VIEWS LEFT  Result Date: 11/28/2021 CLINICAL DATA:  Left hip pain after fall several months ago EXAM: DG HIP (WITH OR WITHOUT PELVIS) 2-3V LEFT COMPARISON:  01/08/2021, 11/23/2021 FINDINGS: Frontal view of the pelvis as well as frontal and frogleg lateral views of the left hip are obtained. Right hip arthroplasty is partially visualized, unchanged in appearance. Penile prosthesis again noted. There is a plate and screw fixation across the proximal left femur again noted. There has been progressive erosive change of the left femoral head  and neck, as well as the left acetabulum. 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 There is concern for underlying infection, hip aspiration could be performed. There are no acute displaced fractures. The remainder of the bony pelvis is unremarkable. IMPRESSION: 1. 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 Given the associated left hip effusion seen on prior CT and MRI, aspiration could be performed if septic arthritis is a concern. 2. Stable right hip arthroplasty. 3. No acute fracture. These results will be called to the ordering clinician or representative by the Radiologist Assistant, and  communication documented in the PACS or [employer]. Electronically Signed   By: Randa Ngo M.D.   On: 11/28/2021 16:37    Assessment/Plan  1. Urinary retention -   ried to relieve the 'obstruction' by inserting a metal rod through the urethra.  After that he started having hematuria and severe suprapubic pain. Bladder scan was done in the ER which showed greater than 669 mL of urine.  CT renal study showed no acute abnormality.ER staff attempted to place a Foley catheter without success so urology was consulted and was able to place a Foley catheter in the ER -   Follow-up with urology, Dr. Bernardo Heater, in 1 week  2. Sepsis secondary to UTI ([place]) -   urine and blood culture grew Enterococcus faecium.  ID recommended linezolid x2 weeks  3. Left hip pain -  X-ray showed progressive bony destruction of the left hip joint which could reflect progressive particle disease.  Orthopedics recommended removal of hardware and conversion to total hip arthroplasty. -    Follow-up with orthopedics  -   Continue oxycodone IR 5 mg 1 tab every 8 hours for pain  4. Coronary artery disease involving native coronary artery of native heart with angina pectoris (HCC) -   Continue isosorbide mononitrate ER, aspirin, Plavix and Lipitor  5. Anemia in stage 3b chronic kidney disease (HCC) Lab Results  Component Value Date   WBC 5.8 12/05/2021   HGB 7.8 (L) 12/05/2021   HCT 24.0 (L) 12/05/2021   MCV 82.2 12/05/2021   PLT 319 12/05/2021   -   Stable  6. Mixed simple and mucopurulent chronic bronchitis (HCC) -    Continue trilogy Ellipta and PRN albuterol  7. Bipolar disorder with psychotic features ([place]) -   Continue Seroquel left  8. Dementia associated with alcoholism, with psychotic disturbance, unspecified dementia severity ([place]) -  BIMS score 12/15, ranging in moderate cognitive impairment     Family/ staff Communication:   Discussed plan of care with resident and charge nurse.  Labs/tests  ordered:  None  Goals of care:   Short-term care   Durenda Age, DNP, MSN, FNP-BC [facility] [phone] (Monday-Friday 8:00 a.m. - 5:00 p.m.) [phone] (after hours)

## 2021-12-07 ENCOUNTER — Non-Acute Institutional Stay (SKILLED_NURSING_FACILITY): Payer: HMO | Admitting: Internal Medicine

## 2021-12-07 ENCOUNTER — Encounter: Payer: Self-pay | Admitting: Internal Medicine

## 2021-12-07 DIAGNOSIS — N184 Chronic kidney disease, stage 4 (severe): Secondary | ICD-10-CM | POA: Diagnosis not present

## 2021-12-07 DIAGNOSIS — G9341 Metabolic encephalopathy: Secondary | ICD-10-CM | POA: Diagnosis not present

## 2021-12-07 DIAGNOSIS — E662 Morbid (severe) obesity with alveolar hypoventilation: Secondary | ICD-10-CM | POA: Diagnosis not present

## 2021-12-07 DIAGNOSIS — R339 Retention of urine, unspecified: Secondary | ICD-10-CM

## 2021-12-07 DIAGNOSIS — N1832 Chronic kidney disease, stage 3b: Secondary | ICD-10-CM

## 2021-12-07 DIAGNOSIS — M25552 Pain in left hip: Secondary | ICD-10-CM

## 2021-12-07 DIAGNOSIS — G8929 Other chronic pain: Secondary | ICD-10-CM

## 2021-12-07 DIAGNOSIS — Z6841 Body Mass Index (BMI) 40.0 and over, adult: Secondary | ICD-10-CM

## 2021-12-07 DIAGNOSIS — Z79891 Long term (current) use of opiate analgesic: Secondary | ICD-10-CM

## 2021-12-07 DIAGNOSIS — D631 Anemia in chronic kidney disease: Secondary | ICD-10-CM

## 2021-12-07 LAB — CBC: RBC: 3.38 — AB (ref 3.87–5.11)

## 2021-12-07 LAB — BASIC METABOLIC PANEL
BUN: 16 (ref 4–21)
CO2: 21 (ref 13–22)
Chloride: 106 (ref 99–108)
Creatinine: 2.1 — AB (ref 0.6–1.3)
Glucose: 89
Potassium: 4.8 (ref 3.4–5.3)
Sodium: 137 (ref 137–147)

## 2021-12-07 LAB — CBC AND DIFFERENTIAL
HCT: 27 — AB (ref 41–53)
Hemoglobin: 8.7 — AB (ref 13.5–17.5)
Neutrophils Absolute: 4.3
Platelets: 381 (ref 150–399)
WBC: 6.4

## 2021-12-07 LAB — COMPREHENSIVE METABOLIC PANEL: Calcium: 8.2 — AB (ref 8.7–10.7)

## 2021-12-07 NOTE — Assessment & Plan Note (Signed)
Patient encephalopathic at the SNF pulling at catheter.  Urine culture will be repeated.

## 2021-12-07 NOTE — Assessment & Plan Note (Addendum)
Pain since fall several months prior to admission 11/23/2021 for urinary retention.  Imaging revealed plate and screw fixation across the proximal left femur w progressive erosive change in the left femoral head, neck & acetabulum.  Prior MRI and CT had demonstrated large left hip effusion described as likely particle disease on MRI 01/08/2021.  Follow-up with Dr. Rudene Christians recommended as OP.

## 2021-12-07 NOTE — Assessment & Plan Note (Addendum)
See 12/07/2021 patient crying out but unable to define any acute issues evening of 12/20; fall approximately 7 AM 12/21 as patient attempted to get out of bed to "get to the police". On exam patient essentially obtunded exhibiting extremely loud snoring and intermittent apnea in the context of scheduled opioids.  Labs ordered to include CBC, BMET, TSH, B12 level, and VDRL (RPR). Psychiatry consult requested ASAP.  Opioids changed to every 6 hours as needed.

## 2021-12-07 NOTE — Assessment & Plan Note (Addendum)
See 12/07/2021 exam revealing mild, harsh snoring  with intermittent apnea and expiratory rhonchi obscuring heart sounds. OSA clinically present. High risk for respiratory suppression with routine opioids; these will be as needed only.

## 2021-12-07 NOTE — Progress Notes (Signed)
NURSING HOME LOCATION:  [facility] ROOM NUMBER:  [id number]  CODE STATUS:  DNR as of 11/23/2021  PCP:  No PCP  This is a comprehensive admission note to this SNFperformed on this date less than 30 days from date of admission. Included are preadmission medical/surgical history; reconciled medication list; family history; social history and comprehensive review of systems.  Corrections and additions to the records were documented. Comprehensive physical exam was also performed. Additionally a clinical summary was entered for each active diagnosis pertinent to this admission in the Problem List to enhance continuity of care.  HPI: He was hospitalized 12/7 - 12/05/2021 presenting to the ER via EMS for difficulty voiding over 24 hours PTA.  Patient reported gross hematuria and severe suprapubic pain rated up to 10 out of 10.  Bladder scan in the ED revealed greater than 669 mL of urine retention.  Foley catheter could not be placed.  Urology consulted and placed 90 French coud catheter resulting in drainage of purulent urine.  By history he had had chills at home without fever; nausea and vomiting; and lower extremity edema.   Urine culture collected @ [facility] documented ampicillin resistant Enterococcus faecalis. Labs revealed a creatinine of 2.10 with a GFR of 30, CKD Stage borderline 3B/4. White count was 8700, lactic acid 2.5, and H/H 9.8/30.8.  Sodium was 130.  Renal stone CT revealed no acute abnormality in the abdomen or pelvis and absence of hydronephrosis.  Bilateral renal cysts were present. Antibiotic therapy was changed to linezolid to complete 2-week course as per ID.  Possible cystoscopy and Foley removal by Dr. Bernardo Heater 1 week post discharge was to be considered.   As noted, admission H/H was 9.8/30.8 with normochromic, normocytic indices.  H/H trended down and was 7.8/24.0 at discharge. Apparently he also has had intermittent left hip pain since a fall  several months ago.This in the context of prior ORIF w IM nailing of proximal L femur. Imaging has revealed progressive erosive changes @ the femoral head,neck, & acetabulum w associated large hip effusion.He is to see Dr. Rudene Christians 1-2 weeks post discharge.   Apparently he has chronic pain and is to be seen in the pain clinic at [facility] Dr. Milinda Pointer on 01-27-22. PT/OT recommended placement for rehab.  Past medical and surgical history: Includes history of asthma, bipolar affective disorder, history of combined systolic/diastolic CHF, CKD stage III, COPD, CAD, essential hypertension, dyslipidemia, OSA, history of prostate cancer, ischemic cardiomyopathy, and PVD. Surgeries and procedures include cardiac catheterization and angiography and stent placement.  He is also had coronary grafting and revascularization.  Social history: Not currently drinking; 50-pack-year smoking history.  Family history: Noncontributory due to advanced age.   Review of systems: BIMS testing revealed a score of 12 out of 15.  Last night he was confused pulling on his call bell cord and wrapping it around his body.  Apparently he did not sleep all night.  This morning approximately @ 7 AM he fell down trying to get out of bed, stating that he he "heard the police and was trying to get to them".  This is prompted security monitor every 15 minutes. At this time he is soundly asleep and does not arouse to even mildly noxious stimuli.  Please see physical findings below.  Physical exam:  Pertinent or positive findings: As noted he was soundly asleep exhibiting extremely loud, harsh snoring and intermittent frank apnea.  When he rarely did attempt to speak; it was  garbled and unintelligible.  He appears chronically ill.  When the lids were lifted; he had dense arcus senilis and dilated pupils.  The maxilla appears to be edentulous.  He has few mandibular teeth which are stained and carious.  He is Geneticist, molecular.  As noted he was  snoring and exhibited coarse rhonchi which obscured heart sounds.  Bowel sounds were present.  Foley catheters present.  1/2+ edema is present at the sock line.  Dorsalis pedis pulses are stronger than posterior tibial pulses.  There is irregular vitiliginous changes over the right knee.  There is faint irregular hyperpigmentation over the shins.  General appearance: no acute distress, increased work of breathing is present.   Lymphatic: No lymphadenopathy about the head, neck, axilla. Eyes: No conjunctival inflammation or lid edema is present. There is no scleral icterus. Ears:  External ear exam shows no significant lesions or deformities.   Nose:  External nasal examination shows no deformity or inflammation. Nasal mucosa are pink and moist without lesions, exudates Neck:  No thyromegaly, masses, tenderness noted.    Lungs:  without wheezes, rales, rubs. Abdomen:  Abdomen is soft and nontender with no organomegaly, hernias, masses. GU: Deferred  Extremities:  No cyanosis, clubbing Neurologic exam: Balance, Rhomberg, finger to nose testing could not be completed due to clinical state Skin: Warm & dry w/o tenting. No significant  rash.  See clinical summary under each active problem in the Problem List with associated updated therapeutic plan

## 2021-12-07 NOTE — Assessment & Plan Note (Addendum)
Med list reviewed; no nephrotoxic agents present. 12/07/2021 stat labs reveal creatinine is stable to improved with a value of 2.12 with a GFR of 30.  Calcium is 8.2 but other chemistries and electrolytes are normal.

## 2021-12-07 NOTE — Assessment & Plan Note (Signed)
Stat CBC 12/07/2021 reveals H/H of 8.7/26.9 with MCV of 79.8 and MCHC of 32.4.  This is actually improved from 7.8/24.0 at discharge 12/05/2021.

## 2021-12-07 NOTE — Assessment & Plan Note (Addendum)
[facility] follow-up appointment 01/31/22 with Dr Milinda Pointer

## 2021-12-08 NOTE — Patient Instructions (Signed)
See assessment and plan under each diagnosis in the problem list and acutely for this visit 

## 2021-12-10 ENCOUNTER — Encounter (HOSPITAL_COMMUNITY): Payer: Self-pay | Admitting: *Deleted

## 2021-12-10 ENCOUNTER — Other Ambulatory Visit: Payer: Self-pay

## 2021-12-10 ENCOUNTER — Emergency Department (HOSPITAL_COMMUNITY)
Admission: EM | Admit: 2021-12-10 | Discharge: 2021-12-11 | Disposition: A | Discharge status: Skilled Nursing Facility | Payer: HMO | Attending: Emergency Medicine | Admitting: Emergency Medicine

## 2021-12-10 DIAGNOSIS — Z955 Presence of coronary angioplasty implant and graft: Secondary | ICD-10-CM | POA: Insufficient documentation

## 2021-12-10 DIAGNOSIS — S3739XA Other injury of urethra, initial encounter: Secondary | ICD-10-CM | POA: Diagnosis not present

## 2021-12-10 DIAGNOSIS — Z96641 Presence of right artificial hip joint: Secondary | ICD-10-CM | POA: Diagnosis not present

## 2021-12-10 DIAGNOSIS — I130 Hypertensive heart and chronic kidney disease with heart failure and stage 1 through stage 4 chronic kidney disease, or unspecified chronic kidney disease: Secondary | ICD-10-CM | POA: Diagnosis not present

## 2021-12-10 DIAGNOSIS — E039 Hypothyroidism, unspecified: Secondary | ICD-10-CM | POA: Diagnosis not present

## 2021-12-10 DIAGNOSIS — N184 Chronic kidney disease, stage 4 (severe): Secondary | ICD-10-CM | POA: Insufficient documentation

## 2021-12-10 DIAGNOSIS — E1151 Type 2 diabetes mellitus with diabetic peripheral angiopathy without gangrene: Secondary | ICD-10-CM | POA: Insufficient documentation

## 2021-12-10 DIAGNOSIS — Z7902 Long term (current) use of antithrombotics/antiplatelets: Secondary | ICD-10-CM | POA: Diagnosis not present

## 2021-12-10 DIAGNOSIS — Z87891 Personal history of nicotine dependence: Secondary | ICD-10-CM | POA: Insufficient documentation

## 2021-12-10 DIAGNOSIS — I5022 Chronic systolic (congestive) heart failure: Secondary | ICD-10-CM | POA: Diagnosis not present

## 2021-12-10 DIAGNOSIS — Z951 Presence of aortocoronary bypass graft: Secondary | ICD-10-CM | POA: Insufficient documentation

## 2021-12-10 DIAGNOSIS — R310 Gross hematuria: Secondary | ICD-10-CM

## 2021-12-10 DIAGNOSIS — X58XXXA Exposure to other specified factors, initial encounter: Secondary | ICD-10-CM | POA: Insufficient documentation

## 2021-12-10 DIAGNOSIS — J45909 Unspecified asthma, uncomplicated: Secondary | ICD-10-CM | POA: Insufficient documentation

## 2021-12-10 DIAGNOSIS — R319 Hematuria, unspecified: Secondary | ICD-10-CM | POA: Diagnosis present

## 2021-12-10 DIAGNOSIS — J449 Chronic obstructive pulmonary disease, unspecified: Secondary | ICD-10-CM | POA: Diagnosis not present

## 2021-12-10 DIAGNOSIS — I2510 Atherosclerotic heart disease of native coronary artery without angina pectoris: Secondary | ICD-10-CM | POA: Insufficient documentation

## 2021-12-10 DIAGNOSIS — S3730XA Unspecified injury of urethra, initial encounter: Secondary | ICD-10-CM

## 2021-12-10 DIAGNOSIS — E1122 Type 2 diabetes mellitus with diabetic chronic kidney disease: Secondary | ICD-10-CM | POA: Diagnosis not present

## 2021-12-10 LAB — URINALYSIS, ROUTINE W REFLEX MICROSCOPIC
Glucose, UA: NEGATIVE mg/dL
Ketones, ur: 15 mg/dL — AB
Nitrite: POSITIVE — AB
Protein, ur: 100 mg/dL — AB
Specific Gravity, Urine: 1.02 (ref 1.005–1.030)
pH: 6.5 (ref 5.0–8.0)

## 2021-12-10 LAB — URINALYSIS, MICROSCOPIC (REFLEX)
Bacteria, UA: NONE SEEN
RBC / HPF: >50 RBC/hpf (ref 0–5)
Squamous Epithelial / HPF: NONE SEEN (ref 0–5)
WBC, UA: >50 WBC/hpf (ref 0–5)

## 2021-12-10 MED ORDER — CEFTRIAXONE SODIUM 1 G IJ SOLR: 1.0000 g | Freq: Once | INTRAMUSCULAR | Status: DC

## 2021-12-10 NOTE — ED Notes (Signed)
RN bladder scanned pt w/ volume 259ml. RN went to place male primofit on pt, and pt had urinary incontinence w/ blood and mucus, pt cleaned up, sheets and pads changed, pt then placed on male primofit for urine sample

## 2021-12-10 NOTE — ED Provider Notes (Signed)
[facility] EMERGENCY DEPARTMENT Provider Note   CSN: [id number] Arrival date & time: 12/10/21  8185     History Chief Complaint  Patient presents with   Hematuria    [name] is a 86 y.o. male.  Pt presents to the ED today because he removed his foley catheter.  Pt was admitted to [facility] from 12/7-12/19 due to urosepsis.  Pt had urinary retention and required urology to place a foley.  Pt has apparently tried to relieve his urinary retention prior to coming to the ED by placing a metal rod in his urethra.  Pt was on Linezolid which was supposed to continue until 12/27.  Pt went to the SNF and decided he did not want the foley any more and pulled it out.  Pt said he urinated right before coming here and does not want the foley put back in.      Past Medical History:  Diagnosis Date   Alcoholism ([place])    Asthma    Bipolar affective disorder ([place])    Chronic combined systolic and diastolic CHF (congestive heart failure) ([place])    a. 08/2016 Echo: EF 40-45%, mild AS, mild to mod MR, mildly dil LA/RA, mild-mod TR; b. 07/2017 Echo: EF 40-45%, mod LVH, Gr1 DD, mild to mod AS, mildly dil LA, nl RV fxn; c. 03/2019 Echo: EF 45-50%, AS (not severe). Mod dil PA.   CKD (chronic kidney disease), stage III (HCC)    COPD (chronic obstructive pulmonary disease) (HCC)    Coronary artery disease    a. 01/2004 s/p PCI and Taxus DES to dRCA (3.5 x 12 mm); b. 07/2017 Lexiscan MV: no ischemia. Sm area of apicl thinning, likely attenuation. EF 33% (GI uptake noted)-->Low risk; c. 03/2019 Inf STEMI/PCI: LM nl, LAD min irregs, D1 20ost, RI 20ost, LCX nl, RCA 90p/60m (4.0x26 Resolute Onyx DES), 9m (4.0x15 Resolute Onyx DES), 10d ISR.   Degenerative joint disease    knees and hip   Dyspnea    on exertion   Essential hypertension    Hyperlipidemia    Hypertension    controlled on meds   Ischemic cardiomyopathy    a. 08/2016 Echo: EF 40-45%; b. 07/2017 Echo: EF 40-45%; c.  03/2019 Echo: EF 45-50%.   OSA (obstructive sleep apnea)    Prostate CA (HCC)    prostate ca dx 20 yrs ago   PVD (peripheral vascular disease) ([place])    Tobacco abuse     Patient Active Problem List   Diagnosis Date Noted   Urinary retention 63/14/9702   Acute metabolic encephalopathy 63/78/5885   Pressure injury of skin 11/24/2021   Sepsis secondary to UTI ([place]) 11/23/2021   Closed anterior dislocation of shoulder (Right) 08/02/2021   Enthesopathy of hip region 07/25/2021   Anterior dislocation of right shoulder 07/04/2021   Acute pain of right shoulder 06/29/2021   Therapeutic opioid-induced constipation (OIC) 06/29/2021   Chronic use of opiate for therapeutic purpose 06/06/2021   Hyposmolality and/or hyponatremia 04/15/2021   Hypothyroidism 04/15/2021   Type 2 diabetes mellitus with diabetic chronic kidney disease ([place]) 04/15/2021   Type 2 diabetes mellitus with diabetic peripheral angiopathy without gangrene ([place]) 04/15/2021   Weakness 02/11/2021   COVID-19 02/11/2021   Morbid obesity ([place]) 01/10/2021   Calcaneal fracture (Right) 01/10/2021   PVD (peripheral vascular disease) ([place]) 02/77/4128   Chronic systolic CHF (congestive heart failure) ([place]) 01/10/2021   Hip fracture ([place]) 01/08/2021   Osteoarthritis of knee (Left) 01/06/2021  Arthropathy of knee (Left) 01/06/2021   Chronic knee pain (Left) 01/06/2021   Class 3 obesity with alveolar hypoventilation and body mass index (BMI) of 40.0 to 44.9 in adult ([place]) 12/23/2020   Hip joint effusion (Left) 12/23/2020   History of alcohol abuse 11/29/2020   Elevated rheumatoid factor 11/29/2020   Polyarthritis with positive rheumatoid factor (HCC) 11/17/2020   Elevated uric acid in blood 11/17/2020   CKD (chronic kidney disease), stage IV (HCC) 10/26/2020   Proteinuria, unspecified 10/26/2020   Elevated BUN 10/26/2020   Elevated serum creatinine 10/26/2020   Hypochloremia 10/26/2020   Hypocalcemia 10/26/2020   Hypoalbuminemia  10/26/2020   Elevated alkaline phosphatase level 10/26/2020   Elevated hemoglobin A1c 10/26/2020   Elevated random blood glucose level 10/26/2020   Anemia in stage 3b chronic kidney disease ([place]) 10/26/2020   Klippel-Feil deformity at the C5-C7 levels 10/26/2020   Carotid artery calcification, bilateral 10/26/2020   History of 2.0 cm thyroid mass (Left lobe) 10/26/2020    Class: History of   Bilateral renal cysts 10/26/2020   Lumbosacral spondylosis 10/26/2020   Osteoarthritis of facet joint of lumbar spine 10/26/2020   DDD (degenerative disc disease), lumbosacral 10/26/2020   History of total hip replacement (Right) 10/26/2020   Osteoarthritis of hip (Left) 10/26/2020   Diverticulosis of sigmoid colon 10/26/2020   Abnormal MRI, lumbar spine (10/07/2020) 10/26/2020   Lumbar central spinal stenosis with neurogenic claudication 10/26/2020   Lumbar foraminal stenosis 10/26/2020   Cyst of kidney, acquired 10/26/2020   Elevated C-reactive protein (CRP) 10/25/2020   Elevated sed rate 10/25/2020   Vitamin D deficiency 10/25/2020   Chronic anticoagulation (Plavix) 09/20/2020   Chronic left hip pain 09/20/2020   Unilateral post-traumatic osteoarthritis of hip (Left) 09/20/2020   Chronic knee pain (2ry area of Pain) (Bilateral) 09/20/2020   Secondary osteoarthritis of knee (Bilateral) 09/20/2020   Osteoarthritis of knees (Bilateral) 09/20/2020   Chronic constipation 09/20/2020   Chronic shoulder pain (3ry area of Pain) (Bilateral) 09/20/2020   Grade 1 (3 mm) anterolisthesis of lumbar spine (L4-5) (stable) 09/20/2020   Chronic groin pain (Left) 09/20/2020   Other intervertebral disc degeneration, lumbar region 09/20/2020   Neurogenic pain 09/20/2020   Chronic pain syndrome 09/18/2020   Pharmacologic therapy 09/18/2020   Disorder of skeletal system 09/18/2020   Problems influencing health status 09/18/2020   Lower extremity pain (Bilateral) 05/15/2018   Elevated troponin I level  07/31/2017   Hyperkalemia 05/28/2017   Syncope 05/27/2017   Meningioma ([place]) 01/05/2017   GI bleed 12/01/2016   Alcohol abuse 10/30/2016   COPD (chronic obstructive pulmonary disease) ([place]) 10/04/2016   Obstructive sleep apnea 10/04/2016   Hyponatremia 08/31/2016   Shortness of breath 02/11/2016   Hyperlipidemia, unspecified 12/03/2015   Essential hypertension 07/17/2013   Erectile dysfunction 05/27/2013   CAD (coronary artery disease), native coronary artery 01/19/2004    Past Surgical History:  Procedure Laterality Date   CARDIAC CATHETERIZATION     CORONARY ANGIOGRAPHY N/A 04/01/2019   Procedure: CORONARY ANGIOGRAPHY;  Surgeon: Nelva Bush, MD;  Location: [facility];  Service: Cardiovascular;  Laterality: N/A;   CORONARY ANGIOPLASTY WITH STENT PLACEMENT     x2   CORONARY/GRAFT ACUTE MI REVASCULARIZATION N/A 04/01/2019   Procedure: Coronary/Graft Acute MI Revascularization;  Surgeon: Nelva Bush, MD;  Location: [facility];  Service: Cardiovascular;  Laterality: N/A;   TOTAL HIP ARTHROPLASTY     right       Family History  Problem Relation Age of Onset  Hypertension Mother    Hyperlipidemia Mother    Heart attack Mother    Hypertension Father    Hyperlipidemia Father    Heart attack Father    Prostate cancer Neg Hx    Bladder Cancer Neg Hx    Kidney cancer Neg Hx     Social History   Tobacco Use   Smoking status: Former    Packs/day: 1.00    Years: 50.00    Pack years: 50.00    Types: Cigarettes   Smokeless tobacco: Never  Vaping Use   Vaping Use: Never used  Substance Use Topics   Alcohol use: Not Currently   Drug use: No    Home Medications Prior to Admission medications   Medication Sig Start Date End Date Taking? Authorizing Provider  ADULT ASPIRIN REGIMEN 81 MG EC tablet TAKE 1 TABLET BY MOUTH DAILY 03/08/20   [name], MD  albuterol (VENTOLIN HFA) 108 (90 Base) MCG/ACT inhaler USE 2 PUFFS EVERY 4 HOURS AS  NEEDED 10/14/19   Darylene Price A, FNP  atorvastatin (LIPITOR) 40 MG tablet Take 40 mg by mouth daily.    [provider]  azelastine (ASTELIN) 0.1 % nasal spray Place 1 spray into both nostrils daily as needed for allergies.    [provider]  calcium carbonate (OSCAL) 1500 (600 Ca) MG TABS tablet Take 1 tablet (1,500 mg total) by mouth 2 (two) times daily with a meal. 11/29/20 12/18/48  Milinda Pointer, MD  carvedilol (COREG) 6.25 MG tablet TAKE 1 TABLET BY MOUTH TWICE A DAY Patient taking differently: Take 6.25 mg by mouth 2 (two) times daily with a meal. 08/15/21   [name], MD  clopidogrel (PLAVIX) 75 MG tablet Take 1 tablet (75 mg total) by mouth daily. 10/28/19   [name], MD  feeding supplement (ENSURE ENLIVE / ENSURE PLUS) LIQD Take 237 mLs by mouth 3 (three) times daily between meals. 01/13/21   Lorella Nimrod, MD  fluticasone (FLONASE) 50 MCG/ACT nasal spray Place 2 sprays into both nostrils daily.    [provider]  furosemide (LASIX) 40 MG tablet TAKE 1 TABLET BY MOUTH DAILY AND TAKE ANADDITIONAL TABLET IN THE AFTERNOON AS NEEDED FOR WEIGHT GAIN AND LEG EDEMA 04/21/21   [name], MD  ipratropium (ATROVENT) 0.03 % nasal spray Place 2 sprays into both nostrils daily. 05/07/20   [provider]  isosorbide mononitrate (IMDUR) 30 MG 24 hr tablet Take 30 mg by mouth daily. 10/26/21   [provider]  lubiprostone (AMITIZA) 8 MCG capsule Take 8 mcg by mouth 2 (two) times daily. 08/24/21   [provider]  Magnesium Oxide 500 MG CAPS Take 1 capsule (500 mg total) by mouth daily. 11/29/20 12/18/48  Milinda Pointer, MD  nitroGLYCERIN (NITROSTAT) 0.4 MG SL tablet Take 0.4 mg by mouth every 5 (five) minutes x 3 doses as needed for chest pain. As needed for chest pain 12/06/15   [provider]  oxyCODONE (OXY IR/ROXICODONE) 5 MG immediate release tablet Take 1 tablet (5 mg total) by mouth every 8 (eight) hours. Must  last 30 days. 12/03/21 01/02/22  Milinda Pointer, MD  QUEtiapine (SEROQUEL) 50 MG tablet Take 50 mg by mouth at bedtime.    [provider]  tamsulosin (FLOMAX) 0.4 MG CAPS capsule Take 0.4 mg by mouth daily.    [provider]  Donnal Debar 200-62.5-25 MCG/INH AEPB Inhale 1 puff into the lungs daily.  04/08/20   [provider]  Cholecalciferol (VITAMIN D3) 125 MCG (5000 UT) CAPS Take 1 capsule (5,000 Units total) by mouth daily with breakfast. Take along with calcium and magnesium. Patient not taking: Reported on 01/27/2021 11/29/20 01/28/21  Milinda Pointer, MD    Allergies    Chlorthalidone, Ace inhibitors, Hydrochlorothiazide, Lisinopril, Losartan, Other, and Benazepril-hydrochlorothiazide  Review of Systems   Review of Systems  Genitourinary:  Positive for dysuria.  All other systems reviewed and are negative.  Physical Exam Updated Vital Signs BP (!) 169/75    Pulse 76    Temp (!) 97.5 F (36.4 C) (Temporal)    Resp 16    Ht 5\' 7"  (1.702 m)    SpO2 97%    BMI 39.16 kg/m   Physical Exam Vitals and nursing note reviewed.  Constitutional:      Appearance: Normal appearance.  HENT:     Head: Normocephalic and atraumatic.     Right Ear: External ear normal.     Left Ear: External ear normal.     Nose: Nose normal.     Mouth/Throat:     Mouth: Mucous membranes are moist.     Pharynx: Oropharynx is clear.  Eyes:     Extraocular Movements: Extraocular movements intact.     Conjunctiva/sclera: Conjunctivae normal.     Pupils: Pupils are equal, round, and reactive to light.  Cardiovascular:     Rate and Rhythm: Normal rate and regular rhythm.     Pulses: Normal pulses.     Heart sounds: Normal heart sounds.  Pulmonary:     Effort: Pulmonary effort is normal.     Breath sounds: Normal breath sounds.  Abdominal:     General: Abdomen is flat. Bowel sounds are normal.     Palpations: Abdomen is soft.  Genitourinary:    Comments: Blood at  meatus from self removal of FC without taking down the balloon. Musculoskeletal:        General: Normal range of motion.     Cervical back: Normal range of motion and neck supple.  Skin:    General: Skin is warm.     Capillary Refill: Capillary refill takes less than 2 seconds.  Neurological:     General: No focal deficit present.     Mental Status: He is alert. Mental status is at baseline.  Psychiatric:        Mood and Affect: Mood normal.        Behavior: Behavior normal.    ED Results / Procedures / Treatments   Labs (all labs ordered are listed, but only abnormal results are displayed) Labs Reviewed  URINE CULTURE  URINALYSIS, ROUTINE W REFLEX MICROSCOPIC    EKG None  Radiology No results found.  Procedures Procedures   Medications Ordered in ED Medications - No data to display  ED Course  I have reviewed the triage vital signs and the nursing notes.  Pertinent labs & imaging results that were available during my care of the patient were reviewed by me and considered in my medical decision making (see chart for details).    MDM Rules/Calculators/A&P                         Post void residual checked after arrival and it was low.  He has been able to urinate since then, but he is incontinent and was wearing a brief.  The nurse put a bag on him to collect the urine.  As long as pt is able  to urinate, he does not need a foley.  He is passing some blood clots and is at high risk for a clot causing further retention, but he really does not want a catheter, so we will wait and see.  Pt signed out to Dr. Darl Householder at shift change.  Final Clinical Impression(s) / ED Diagnoses Final diagnoses:  Urethral trauma, initial encounter  Gross hematuria    Rx / DC Orders ED Discharge Orders     None        Isla Pence, MD 12/10/21 1536

## 2021-12-10 NOTE — ED Notes (Addendum)
Bladder scanned 58 cc in bladder. Bright red blood continues to ooze  from penis

## 2021-12-10 NOTE — Discharge Instructions (Signed)
Your urinalysis showed a lot of blood consistent with you pulling out your Foley catheter.   We sent out urine culture and you will be called if there is bacteria in it.  Except blood in your urine.   See urology for follow-up  Return to ER if you have unable to urinate, severe lower abdominal pain, vomiting and fever

## 2021-12-10 NOTE — ED Triage Notes (Signed)
Patient presents to ed via [facility] states  pulled out his foley , states he doesn't want the foley  in so he pulled it out. Bleeding from penis

## 2021-12-10 NOTE — ED Provider Notes (Signed)
°  Physical Exam  BP (!) 164/80 (BP Location: Right Arm)    Pulse 76    Temp (!) 97.5 F (36.4 C) (Temporal)    Resp 16    Ht 5\' 7"  (1.702 m)    SpO2 99%    BMI 39.16 kg/m   Physical Exam  ED Course/Procedures     Procedures  MDM  Patient care assumed at 3 PM.  Patient recently had urinary retention and had a Foley placed.  Patient also had UTI and finished 2 weeks of linezolid.  Patient pulled out his Foley catheter earlier today and did not want it back in.  His initial bladder scan showed about 50 cc in his bladder.  Signout pending urinalysis and repeat bladder scan  7:27 PM Patient able to urinate about 150 cc.  Still about 100 cc in his bladder now.  His UA showed positive nitrate and leuk and blood and > 50 WBC.  However there is no bacteria in it.  I reviewed his urine culture.  It showed E faecalis.  However he finished 2 weeks of linezolid.  I wonder if he is colonized right now.  His urinalysis also consistent with him pulling out his Foley and has trauma to his urethra.  At this point, I think I would hold off on antibiotics and will repeat the urine culture.  If it is positive then he needs treatment.  Since he is not in retention and Foley has been in for about 2 weeks, I think he is stable for discharge and follow-up with urology outpatient.  If he is back in retention, patient will need Foley catheter again.  Stable to return back to facility.      Drenda Freeze, MD 12/10/21 [phone]

## 2021-12-11 NOTE — ED Notes (Signed)
Pt had an emesis episode while in a reclining position. Pt maintains O2 saturation above 98%. MD made aware. Will continue to monitor

## 2021-12-11 NOTE — ED Notes (Signed)
Pt cleaned up and changed, pt had another emesis episode, O2 remains 98%

## 2021-12-11 NOTE — ED Notes (Signed)
[facility] called  

## 2021-12-11 NOTE — ED Notes (Signed)
RN called [facility] transport as [facility] is extremely delayed today, next available [facility] appointment is 2230 tonight. This RN updated primary RN.

## 2021-12-12 LAB — URINE CULTURE: Culture: 80000 — AB

## 2021-12-13 ENCOUNTER — Encounter: Payer: Self-pay | Admitting: Adult Health

## 2021-12-13 ENCOUNTER — Non-Acute Institutional Stay (SKILLED_NURSING_FACILITY): Payer: HMO | Admitting: Adult Health

## 2021-12-13 ENCOUNTER — Other Ambulatory Visit: Payer: Self-pay | Admitting: Adult Health

## 2021-12-13 DIAGNOSIS — Z79899 Other long term (current) drug therapy: Secondary | ICD-10-CM

## 2021-12-13 DIAGNOSIS — R339 Retention of urine, unspecified: Secondary | ICD-10-CM | POA: Diagnosis not present

## 2021-12-13 DIAGNOSIS — F319 Bipolar disorder, unspecified: Secondary | ICD-10-CM

## 2021-12-13 DIAGNOSIS — G8929 Other chronic pain: Secondary | ICD-10-CM

## 2021-12-13 DIAGNOSIS — M25511 Pain in right shoulder: Secondary | ICD-10-CM

## 2021-12-13 DIAGNOSIS — Z79891 Long term (current) use of opiate analgesic: Secondary | ICD-10-CM

## 2021-12-13 DIAGNOSIS — G894 Chronic pain syndrome: Secondary | ICD-10-CM

## 2021-12-13 DIAGNOSIS — S3730XS Unspecified injury of urethra, sequela: Secondary | ICD-10-CM

## 2021-12-13 DIAGNOSIS — I25119 Atherosclerotic heart disease of native coronary artery with unspecified angina pectoris: Secondary | ICD-10-CM | POA: Diagnosis not present

## 2021-12-13 DIAGNOSIS — M25552 Pain in left hip: Secondary | ICD-10-CM

## 2021-12-13 MED ORDER — OXYCODONE HCL 5 MG PO TABS: 5.0000 mg | ORAL_TABLET | Freq: Three times a day (TID) | ORAL | 0 refills | Status: AC | PRN

## 2021-12-13 NOTE — Progress Notes (Signed)
Location:  [facility] Room Number: [place] of Service:  SNF (31) Provider:  Durenda Age, DNP, FNP-BC  Patient Care Team: Pcp, No as PCP - General [name], MD as PCP - Cardiology (Cardiology) Alisa Graff, FNP as Nurse Practitioner (Family Medicine) [name], MD as Consulting Physician (Cardiology) Erby Pian, MD as Referring Physician (Specialist)  Extended Emergency Contact Information Primary Emergency Contact: [name] Mobile Phone: [phone] Relation: Daughter Secondary Emergency Contact: [name] [place] of [place] Phone: [phone] Relation: Daughter  Code Status:  DNR  Goals of care: Advanced Directive information Advanced Directives 12/13/2021  Does Patient Have a Medical Advance Directive? Yes  Type of Paramedic of [place];Out of facility DNR (pink MOST or yellow form)  Does patient want to make changes to medical advance directive? No - Patient declined  Copy of Home in Chart? Yes - validated most recent copy scanned in chart (See row information)  Would patient like information on creating a medical advance directive? -  Pre-existing out of facility DNR order (yellow form or pink MOST form) Yellow form placed in chart (order not valid for inpatient use)     Chief Complaint  Patient presents with   Acute Visit    Follow up ED visit.    HPI:  Pt is a 86 y.o. male who was re-admitted to [facility] on 12/10/21 post ED visit same day. He is a short-term care resident of [place] who was sent to ED for pulling out his Foley catheter with balloon and did not want it back in.  Urine culture showed E faecalis.  He had just finished 2 weeks of linezolid.  It was recommended that if he is back in retention then he will need Foley catheter again and will need to follow-up with  urology.  In his room today.  He reported being able to urinate without dysuria nor hematuria.  He stated that he goes to pain clinic for chronic pain management on his left hip.    Past Medical History:  Diagnosis Date   Alcoholism ([place])    Asthma    Bipolar affective disorder ([place])    Chronic combined systolic and diastolic CHF (congestive heart failure) ([place])    a. 08/2016 Echo: EF 40-45%, mild AS, mild to mod MR, mildly dil LA/RA, mild-mod TR; b. 07/2017 Echo: EF 40-45%, mod LVH, Gr1 DD, mild to mod AS, mildly dil LA, nl RV fxn; c. 03/2019 Echo: EF 45-50%, AS (not severe). Mod dil PA.   CKD (chronic kidney disease), stage III (HCC)    COPD (chronic obstructive pulmonary disease) (HCC)    Coronary artery disease    a. 01/2004 s/p PCI and Taxus DES to dRCA (3.5 x 12 mm); b. 07/2017 Lexiscan MV: no ischemia. Sm area of apicl thinning, likely attenuation. EF 33% (GI uptake noted)-->Low risk; c. 03/2019 Inf STEMI/PCI: LM nl, LAD min irregs, D1 20ost, RI 20ost, LCX nl, RCA 90p/28m (4.0x26 Resolute Onyx DES), 47m (4.0x15 Resolute Onyx DES), 10d ISR.   Degenerative joint disease    knees and hip   Dyspnea    on exertion   Essential hypertension    Hyperlipidemia    Hypertension    controlled on meds   Ischemic cardiomyopathy    a. 08/2016 Echo: EF 40-45%; b. 07/2017 Echo: EF 40-45%; c. 03/2019 Echo: EF 45-50%.   OSA (obstructive sleep apnea)  Prostate CA [facility])    prostate ca dx 20 yrs ago   PVD (peripheral vascular disease) ([place])    Tobacco abuse    Past Surgical History:  Procedure Laterality Date   CARDIAC CATHETERIZATION     CORONARY ANGIOGRAPHY N/A 04/01/2019   Procedure: CORONARY ANGIOGRAPHY;  Surgeon: Nelva Bush, MD;  Location: [place] [facility];  Service: Cardiovascular;  Laterality: N/A;   CORONARY ANGIOPLASTY WITH STENT PLACEMENT     x2   CORONARY/GRAFT ACUTE MI REVASCULARIZATION N/A 04/01/2019   Procedure: Coronary/Graft Acute MI Revascularization;  Surgeon: Nelva Bush, MD;  Location: [place] [facility];  Service: Cardiovascular;  Laterality: N/A;   TOTAL HIP ARTHROPLASTY     right    Allergies  Allergen Reactions   Chlorthalidone Other (See Comments)    Hyponatremia   Ace Inhibitors Cough   Hydrochlorothiazide    Lisinopril Other (See Comments)   Losartan    Other Other (See Comments)    "ANY BLOOD PRESSURE MEDICATIONS THAT I'VE TRIED" - PT. DOES NOT REMEMBER WHICH ONES "ANY BLOOD PRESSURE MEDICATIONS THAT I'VE TRIED"- PT. DOES NOT REMEMBER WHICH ONES- CONSTIPATION   Benazepril-Hydrochlorothiazide Other (See Comments)    Constipation    Outpatient Encounter Medications as of 12/13/2021  Medication Sig   ADULT ASPIRIN REGIMEN 81 MG EC tablet TAKE 1 TABLET BY MOUTH DAILY   albuterol (VENTOLIN HFA) 108 (90 Base) MCG/ACT inhaler USE 2 PUFFS EVERY 4 HOURS AS NEEDED   atorvastatin (LIPITOR) 40 MG tablet Take 40 mg by mouth daily.   azelastine (ASTELIN) 0.1 % nasal spray Place 1 spray into both nostrils daily as needed for allergies.   bisacodyl (DULCOLAX) 10 MG suppository Place 10 mg rectally as needed for moderate constipation.   calcium carbonate (OSCAL) 1500 (600 Ca) MG TABS tablet Take 1 tablet (1,500 mg total) by mouth 2 (two) times daily with a meal.   carvedilol (COREG) 6.25 MG tablet TAKE 1 TABLET BY MOUTH TWICE A DAY   clopidogrel (PLAVIX) 75 MG tablet Take 1 tablet (75 mg total) by mouth daily.   feeding supplement (ENSURE ENLIVE / ENSURE PLUS) LIQD Take 237 mLs by mouth 3 (three) times daily between meals.   fluticasone (FLONASE) 50 MCG/ACT nasal spray Place 2 sprays into both nostrils daily.   furosemide (LASIX) 40 MG tablet TAKE 1 TABLET BY MOUTH DAILY AND TAKE ANADDITIONAL TABLET IN THE AFTERNOON AS NEEDED FOR WEIGHT GAIN AND LEG EDEMA   ipratropium (ATROVENT) 0.03 % nasal spray Place 2 sprays into both nostrils daily.   isosorbide mononitrate (IMDUR) 30 MG 24 hr tablet Take 30 mg by mouth daily.   lubiprostone (AMITIZA) 8  MCG capsule Take 8 mcg by mouth 2 (two) times daily.   Magnesium Hydroxide (MILK OF MAGNESIA PO) Take 30 mLs by mouth as needed.   Magnesium Oxide 500 MG CAPS Take 1 capsule (500 mg total) by mouth daily.   melatonin 5 MG TABS Take 5 mg by mouth at bedtime.   nitroGLYCERIN (NITROSTAT) 0.4 MG SL tablet Take 0.4 mg by mouth every 5 (five) minutes x 3 doses as needed for chest pain. As needed for chest pain   oxyCODONE (OXY IR/ROXICODONE) 5 MG immediate release tablet Take 1 tablet (5 mg total) by mouth every 8 (eight) hours. Must last 30 days.   QUEtiapine (SEROQUEL) 50 MG tablet Take 50 mg by mouth at bedtime.   Sodium Phosphates (RA SALINE ENEMA RE) Place 1 Dose rectally as needed.   tamsulosin (FLOMAX)  0.4 MG CAPS capsule Take 0.4 mg by mouth daily.   TRELEGY ELLIPTA 200-62.5-25 MCG/INH AEPB Inhale 1 puff into the lungs daily.    [DISCONTINUED] Cholecalciferol (VITAMIN D3) 125 MCG (5000 UT) CAPS Take 1 capsule (5,000 Units total) by mouth daily with breakfast. Take along with calcium and magnesium. (Patient not taking: Reported on 01/27/2021)   No facility-administered encounter medications on file as of 12/13/2021.    Review of Systems  GENERAL: No change in appetite, no fatigue, no weight changes, no fever or chills   MOUTH and THROAT: Denies oral discomfort, gingival pain or bleeding RESPIRATORY: no cough, SOB, DOE, wheezing, hemoptysis CARDIAC: No chest pain, edema or palpitations GI: No abdominal pain, diarrhea, constipation, heart burn, nausea or vomiting GU: Denies dysuria, hematuria or discharge NEUROLOGICAL: Denies dizziness, syncope, numbness, or headache PSYCHIATRIC: Denies feelings of depression or anxiety. No report of hallucinations, insomnia, paranoia, or agitation   Immunization History  Administered Date(s) Administered   Influenza,inj,Quad PF,6+ Mos 09/01/2016, 08/29/2017   Pertinent  Health Maintenance Due  Topic Date Due   FOOT EXAM  Never done   OPHTHALMOLOGY  EXAM  Never done   URINE MICROALBUMIN  Never done   INFLUENZA VACCINE  07/18/2021   HEMOGLOBIN A1C  12/30/2021   Fall Risk 12/04/2021 12/04/2021 12/05/2021 12/10/2021 12/11/2021  Falls in the past year? - - - - -  Was there an injury with Fall? - - - - -  Fall Risk Category Calculator - - - - -  Fall Risk Category - - - - -  Patient Fall Risk Level High fall risk High fall risk High fall risk High fall risk High fall risk  Patient at Risk for Falls Due to - - - - -  Fall risk Follow up - - - - -     Vitals:   12/13/21 1042  BP: 109/67  Pulse: 74  Resp: 18  Temp: 97.9 F (36.6 C)  Weight: 244 lb (110.7 kg)  Height: 5\' 7"  (1.702 m)   Body mass index is 38.22 kg/m.  Physical Exam  GENERAL APPEARANCE: Well nourished. In no acute distress.  Morbidly obese. SKIN:  Skin is warm and dry.  MOUTH and THROAT: Lips are without lesions. Oral mucosa is moist and without lesions.  RESPIRATORY: Breathing is even & unlabored, BS CTAB CARDIAC: RRR, no murmur,no extra heart sounds, no edema GI: Abdomen soft, normal BS, no masses, no tenderness NEUROLOGICAL: There is no tremor. Speech is clear. Alert to self, disoriented to time and place. PSYCHIATRIC:  Affect and behavior are appropriate  Labs reviewed: Recent Labs    01/10/21 0245 01/13/21 0718 02/15/21 0511 02/16/21 0457 06/20/21 0803 11/24/21 0509 11/25/21 0340 12/03/21 0457 12/04/21 0303 12/05/21 0333  NA 140   < > 134* 135   < > 133*   < > 136 132* 135  K 3.8   < > 4.8 4.8   < > 4.4   < > 4.3 4.4 4.5  CL 106   < > 103 103   < > 99   < > 107 105 108  CO2 24   < > 25 25   < > 22   < > 22 20* 21*  GLUCOSE 98   < > 91 92   < > 82   < > 91 84 90  BUN 33*   < > 46* 48*   < > 24*   < > 24* 21 19  CREATININE 2.31*   < >  2.18* 2.16*   < > 2.35*   < > 2.20* 2.20* 2.17*  CALCIUM 8.2*   < > 8.4* 8.4*   < > 8.2*   < > 7.9* 8.1* 7.9*  MG 2.1   < > 2.0 2.0  --  1.5*  --   --   --   --   PHOS 4.6  --   --   --   --   --   --   --   --    --    < > = values in this interval not displayed.   Recent Labs    02/11/21 0946 02/12/21 0537 06/20/21 1025 09/02/21 1418  AST 33 28 18  --   ALT 55* 43 9  --   ALKPHOS 111 97 123  --   BILITOT 1.1 0.7 0.6  --   PROT 6.9 6.1* 8.0  --   ALBUMIN 2.9* 2.5* 3.5 3.2*   Recent Labs    11/23/21 0518 11/24/21 0509 11/25/21 0340 11/26/21 0633 11/29/21 0643 12/03/21 0457 12/04/21 0303 12/05/21 0333  WBC 8.7   < > 16.0* 10.7*   < > 9.2 7.3 5.8  NEUTROABS 6.2  --  12.5* 8.3*  --   --   --   --   HGB 9.8*   < > 8.5* 8.4*   < > 8.6* 7.8* 7.8*  HCT 30.8*   < > 25.7* 25.8*   < > 24.9* 23.8* 24.0*  MCV 83.5   < > 82.4 82.7   < > 80.8 81.0 82.2  PLT 309   < > 209 186   < > 289 308 319   < > = values in this interval not displayed.   Lab Results  Component Value Date   TSH 1.388 02/11/2021   Lab Results  Component Value Date   HGBA1C 6.1 (H) 06/29/2021   Lab Results  Component Value Date   CHOL 105 08/26/2021   HDL 38 (L) 08/26/2021   LDLCALC 52 08/26/2021   TRIG 70 08/26/2021   CHOLHDL 2.8 08/26/2021    Significant Diagnostic Results in last 30 days:  US Venous Img Upper Uni Right(DVT)  Result Date: 11/29/2021 CLINICAL DATA:  Right arm swelling EXAM: RIGHT UPPER EXTREMITY VENOUS DOPPLER ULTRASOUND TECHNIQUE: Gray-scale sonography with graded compression, as well as color Doppler and duplex ultrasound were performed to evaluate the upper extremity deep venous system from the level of the subclavian vein and including the jugular, axillary, basilic, radial, ulnar and upper cephalic vein. Spectral Doppler was utilized to evaluate flow at rest and with distal augmentation maneuvers. COMPARISON:  None. FINDINGS: Contralateral Subclavian Vein: Respiratory phasicity is normal and symmetric with the symptomatic side. No evidence of thrombus. Normal compressibility. Internal Jugular Vein: No evidence of thrombus. Normal compressibility, respiratory phasicity and response to  augmentation. Subclavian Vein: No evidence of thrombus. Normal compressibility, respiratory phasicity and response to augmentation. Axillary Vein: No evidence of thrombus. Normal compressibility, respiratory phasicity and response to augmentation. Cephalic Vein: No evidence of thrombus. Normal compressibility, respiratory phasicity and response to augmentation. Basilic Vein: No evidence of thrombus. Normal compressibility, respiratory phasicity and response to augmentation. Brachial Veins: No evidence of thrombus. Normal compressibility, respiratory phasicity and response to augmentation. Radial Veins: No evidence of thrombus. Normal compressibility, respiratory phasicity and response to augmentation. Ulnar Veins: No evidence of thrombus. Normal compressibility, respiratory phasicity and response to augmentation. IMPRESSION: No evidence of DVT within the right upper extremity. Electronically Signed  By: Eugenie Filler M.D.   On: 11/29/2021 13:57   DG Chest Portable 1 View  Result Date: 11/23/2021 CLINICAL DATA:  Weakness, evaluate for edema EXAM: PORTABLE CHEST 1 VIEW COMPARISON:  Chest radiograph 06/20/2021 FINDINGS: The heart is enlarged. The upper mediastinal contours are within normal limits. There are diffusely increased interstitial markings bilaterally likely reflecting mild pulmonary interstitial edema. There is no focal consolidation. There is no pleural effusion or pneumothorax There is no acute osseous abnormality. IMPRESSION: Cardiomegaly with mild pulmonary interstitial edema. Electronically Signed   By: Valetta Mole M.D.   On: 11/23/2021 11:44   CT Renal Stone Study  Result Date: 11/23/2021 CLINICAL DATA:  Flank pain, kidney stone suspected. Urinary retention. EXAM: CT ABDOMEN AND PELVIS WITHOUT CONTRAST TECHNIQUE: Multidetector CT imaging of the abdomen and pelvis was performed following the standard protocol without IV contrast. COMPARISON:  CT abdomen and pelvis 08/05/2017 FINDINGS: Lower chest:  Mild bibasilar lung scarring. No pleural effusion. Three-vessel coronary atherosclerosis. Hepatobiliary: No focal liver abnormality is seen. No gallstones, gallbladder wall thickening, or biliary dilatation. Pancreas: Unremarkable. Spleen: Calcified granulomas. Adrenals/Urinary Tract: Unremarkable adrenal glands. Bilateral low-density renal lesions compatible with cysts measuring up to 5.1 cm on the right and 4.4 cm on the left with some of the cysts having at most mildly enlarged since 2018. No renal calculi or hydronephrosis. Vascular calcifications in both renal hila. No ureteral calculi or ureteral dilatation. Foley catheter in the bladder which is largely collapsed limiting evaluation. Stomach/Bowel: The stomach is unremarkable. There is no evidence of bowel obstruction or inflammation. Predominantly left-sided colonic diverticulosis is noted without evidence of acute diverticulitis. The appendix is unremarkable. Vascular/Lymphatic: Abdominal aortic atherosclerosis without aneurysm. No enlarged lymph nodes. Reproductive: Partially visualized penile implant with reservoir in the left pelvis. Unremarkable prostate. Other: No ascites or pneumoperitoneum. Musculoskeletal: Right hip arthroplasty. ORIF of the left femur with extensive erosion/resorption of the left femoral head, largely new from 2018 but similar in appearance to 02/11/2021 pelvic radiographs. IMPRESSION: 1. No acute abnormality identified in the abdomen or pelvis. No hydronephrosis. 2. Bilateral renal cysts. 3. Colonic diverticulosis. 4. Aortic Atherosclerosis (ICD10-I70.0). Electronically Signed   By: Logan Bores M.D.   On: 11/23/2021 11:19   DG HIP UNILAT WITH PELVIS 2-3 VIEWS LEFT  Result Date: 11/28/2021 CLINICAL DATA:  Left hip pain after fall several months ago EXAM: DG HIP (WITH OR WITHOUT PELVIS) 2-3V LEFT COMPARISON:  01/08/2021, 11/23/2021 FINDINGS: Frontal view of the pelvis as well as frontal and frogleg lateral views of the left  hip are obtained. Right hip arthroplasty is partially visualized, unchanged in appearance. Penile prosthesis again noted. There is a plate and screw fixation across the proximal left femur again noted. There has been progressive erosive change of the left femoral head and neck, as well as the left acetabulum. 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 There is concern for underlying infection, hip aspiration could be performed. There are no acute displaced fractures. The remainder of the bony pelvis is unremarkable. IMPRESSION: 1. 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 Given the associated left hip effusion seen on prior CT and MRI, aspiration could be performed if septic arthritis is a concern. 2. Stable right hip arthroplasty. 3. No acute fracture. These results will be called to the ordering clinician or representative by the Radiologist Assistant, and communication documented in the PACS or  [facility] Dashboard. Electronically Signed   By: Randa Ngo M.D.   On: 11/28/2021 16:37    Assessment/Plan  1. Trauma of urethra, sequela -   pulled foley catheter with balloon -  able to urinate with no urinary retention, dysuria nor hematuria -  Recently completed Linezolid -  follow up with urology  2. Urinary retention -   Continue Flomax -  able to urinate without retention, dysuria nor hematuria -  will need to have foley catheter re-inserted for urinary retention -  follow up with urology  3. Bipolar disorder with psychotic features ([place]) - mood is stable, continue Seroquel  4. Coronary artery disease involving native coronary artery of native heart with angina pectoris (HCC) -    No chest pains, continue Plavix, isosorbide mononitrate ER and NTG as needed  5. Left hip pain -Continue oxycodone IR 5 mg 1 tab every 8 hours  PRN    Family/ staff Communication:   Discussed plan of care with resident and charge nurse.  Labs/tests ordered: None  Goals of care:   Short-term care   Durenda Age, DNP, MSN, FNP-BC [facility] [phone] (Monday-Friday 8:00 a.m. - 5:00 p.m.) [phone] (after hours)

## 2021-12-16 LAB — BASIC METABOLIC PANEL
BUN: 27 — AB (ref 4–21)
CO2: 20 (ref 13–22)
Chloride: 103 (ref 99–108)
Creatinine: 2.3 — AB (ref 0.6–1.3)
Glucose: 90
Potassium: 3.9 (ref 3.4–5.3)
Sodium: 139 (ref 137–147)

## 2021-12-16 LAB — COMPREHENSIVE METABOLIC PANEL
Calcium: 8.2 — AB (ref 8.7–10.7)
GFR calc Af Amer: 28.66
GFR calc non Af Amer: 24.73

## 2021-12-16 LAB — CBC AND DIFFERENTIAL
HCT: 25 — AB (ref 41–53)
Hemoglobin: 8.6 — AB (ref 13.5–17.5)
Neutrophils Absolute: 3.4
Platelets: 378 (ref 150–399)
WBC: 6.2

## 2021-12-16 LAB — CBC: RBC: 3.13 — AB (ref 3.87–5.11)

## 2021-12-21 ENCOUNTER — Encounter: Payer: Self-pay | Admitting: Adult Health

## 2021-12-21 ENCOUNTER — Non-Acute Institutional Stay (SKILLED_NURSING_FACILITY): Payer: HMO | Admitting: Adult Health

## 2021-12-21 DIAGNOSIS — M25552 Pain in left hip: Secondary | ICD-10-CM

## 2021-12-21 DIAGNOSIS — I25119 Atherosclerotic heart disease of native coronary artery with unspecified angina pectoris: Secondary | ICD-10-CM | POA: Diagnosis not present

## 2021-12-21 DIAGNOSIS — N184 Chronic kidney disease, stage 4 (severe): Secondary | ICD-10-CM | POA: Diagnosis not present

## 2021-12-21 DIAGNOSIS — J418 Mixed simple and mucopurulent chronic bronchitis: Secondary | ICD-10-CM

## 2021-12-21 DIAGNOSIS — F319 Bipolar disorder, unspecified: Secondary | ICD-10-CM

## 2021-12-21 DIAGNOSIS — G8929 Other chronic pain: Secondary | ICD-10-CM

## 2021-12-21 NOTE — Progress Notes (Signed)
Location:  [facility] Room Number: [place] of Service:  SNF (31) Provider:  Durenda Age, DNP, FNP-BC  Patient Care Team: Pcp, No as PCP - General [name], MD as PCP - Cardiology (Cardiology) Alisa Graff, FNP as Nurse Practitioner (Family Medicine) [name], MD as Consulting Physician (Cardiology) Erby Pian, MD as Referring Physician (Specialist)  Extended Emergency Contact Information Primary Emergency Contact: [name] Mobile Phone: [phone] Relation: Daughter Secondary Emergency Contact: [name] [place] Phone: [phone] Relation: Daughter  Code Status:  DNR  Goals of care: Advanced Directive information Advanced Directives 12/21/2021  Does Patient Have a Medical Advance Directive? Yes  Type of Advance Directive [place]  Does patient want to make changes to medical advance directive? No - Patient declined  Copy of [place] in Chart? Yes - validated most recent copy scanned in chart (See row information)  Would patient like information on creating a medical advance directive? -  Pre-existing out of facility DNR order (yellow form or pink MOST form) Yellow form placed in chart (order not valid for inpatient use)     Chief Complaint  Patient presents with   Acute Visit    Short term rehab    HPI:  Pt is a 87 y.o. [name] seen today for short-term rehabilitation visit. He has a PMH of chronic combined systolic and diastolic dysfunction CHF, stage III chronic kidney disease, coronary artery disease, COPD, bipolar disorder, history of prostate cancer treated with radiation therapy in 1994 and S/P penile prosthesis implant and with revision in 2014.  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 08/2016 Echo: EF 40-45%, mild AS, mild to mod MR, mildly dil LA/RA, mild-mod TR; b. 07/2017 Echo: EF 40-45%, mod LVH, Gr1 DD, mild to mod AS, mildly dil LA, nl RV fxn; c. 03/2019 Echo: EF 45-50%, AS (not severe). Mod dil PA.   CKD (chronic kidney disease), stage III (HCC)    COPD (chronic obstructive pulmonary disease) (HCC)    Coronary artery disease    a. 01/2004 s/p PCI and Taxus DES to dRCA (3.5 x 12 mm); b. 07/2017 Lexiscan MV: no ischemia. Sm area of apicl thinning, likely attenuation. EF 33% (GI uptake noted)-->Low risk; c. 03/2019 Inf STEMI/PCI: LM nl, LAD min irregs, D1 20ost, RI 20ost, LCX nl, RCA 90p/60m(4.0x26 Resolute Onyx DES), 961m4.0x15 Resolute Onyx DES), 10d ISR.   Degenerative joint disease    knees and  hip   Dyspnea    on exertion   Essential hypertension    Hyperlipidemia    Hypertension    controlled on meds   Ischemic cardiomyopathy    a. 08/2016 Echo: EF 40-45%; b. 07/2017 Echo: EF 40-45%; c. 03/2019 Echo: EF 45-50%.   OSA (obstructive sleep apnea)    Prostate CA (HCC)    prostate ca dx 20 yrs ago   PVD (peripheral vascular disease) ([place])    Tobacco abuse    Past Surgical History:  Procedure Laterality  Date   CARDIAC CATHETERIZATION     CORONARY ANGIOGRAPHY N/A 04/01/2019   Procedure: CORONARY ANGIOGRAPHY;  Surgeon: Nelva Bush, MD;  Location: [facility];  Service: Cardiovascular;  Laterality: N/A;   CORONARY ANGIOPLASTY WITH STENT PLACEMENT     x2   CORONARY/GRAFT ACUTE MI REVASCULARIZATION N/A 04/01/2019   Procedure: Coronary/Graft Acute MI Revascularization;  Surgeon: Nelva Bush, MD;  Location: [facility];  Service: Cardiovascular;  Laterality: N/A;   TOTAL HIP ARTHROPLASTY     right    Allergies  Allergen Reactions   Chlorthalidone Other (See Comments)    Hyponatremia   Ace Inhibitors Cough   Hydrochlorothiazide    Lisinopril Other (See Comments)   Losartan    Other Other (See Comments)    "ANY BLOOD PRESSURE MEDICATIONS THAT I'VE TRIED" - PT. DOES NOT REMEMBER WHICH ONES "ANY BLOOD PRESSURE MEDICATIONS THAT I'VE TRIED"- PT. DOES NOT REMEMBER WHICH ONES- CONSTIPATION   Benazepril-Hydrochlorothiazide Other (See Comments)    Constipation    Outpatient Encounter Medications as of 12/21/2021  Medication Sig   ADULT ASPIRIN REGIMEN [place] MG EC tablet TAKE 1 TABLET BY MOUTH DAILY   albuterol (VENTOLIN HFA) 108 (90 Base) MCG/ACT inhaler USE 2 PUFFS EVERY 4 HOURS AS NEEDED   Amino Acids-Protein Hydrolys (PRO-STAT PO) Take 30 mLs by mouth 2 (two) times daily.   atorvastatin (LIPITOR) 40 MG tablet Take 40 mg by mouth daily.   azelastine (ASTELIN) 0.1 % nasal spray Place 1 spray into both nostrils daily as needed for allergies.   bisacodyl (DULCOLAX) 10 MG suppository Place 10 mg rectally as needed for moderate constipation.   calcium carbonate (OSCAL) 1500 (600 Ca) MG TABS tablet Take 1 tablet (1,500 mg total) by mouth 2 (two) times daily with a meal.   carvedilol (COREG) 6.25 MG tablet TAKE 1 TABLET BY MOUTH TWICE A DAY   clopidogrel (PLAVIX) 75 MG tablet Take 1 tablet (75 mg total) by mouth daily.   feeding supplement (ENSURE ENLIVE / ENSURE PLUS) LIQD Take  237 mLs by mouth 3 (three) times daily between meals.   fluticasone (FLONASE) 50 MCG/ACT nasal spray Place 2 sprays into both nostrils daily.   furosemide (LASIX) 40 MG tablet TAKE 1 TABLET BY MOUTH DAILY AND TAKE ANADDITIONAL TABLET IN THE AFTERNOON AS NEEDED FOR WEIGHT GAIN AND LEG EDEMA   ipratropium (ATROVENT) 0.03 % nasal spray Place 2 sprays into both nostrils daily.   isosorbide mononitrate (IMDUR) 30 MG 24 hr tablet Take 30 mg by mouth daily.   lubiprostone (AMITIZA) 8 MCG capsule Take 8 mcg by mouth 2 (two) times daily.   Magnesium Hydroxide (MILK OF MAGNESIA PO) Take 30 mLs by mouth as needed.   Magnesium Oxide 500 MG CAPS Take 1 capsule (500 mg total) by mouth daily.   melatonin 5 MG TABS Take 5 mg by mouth at bedtime.   nitroGLYCERIN (NITROSTAT) 0.4 MG SL tablet Take 0.4 mg by mouth every  5 (five) minutes x 3 doses as needed for chest pain. As needed for chest pain   oxyCODONE (OXY IR/ROXICODONE) 5 MG immediate release tablet Take 1 tablet (5 mg total) by mouth every 8 (eight) hours as needed for severe pain. Must last 30 days.   QUEtiapine (SEROQUEL) 50 MG tablet Take 50 mg by mouth at bedtime.   Sodium Phosphates (RA SALINE ENEMA RE) Place 1 Dose rectally as needed.   tamsulosin (FLOMAX) 0.4 MG CAPS capsule Take 0.4 mg by mouth daily.   TRELEGY ELLIPTA 200-62.5-25 MCG/INH AEPB Inhale 1 puff into the lungs daily.    [DISCONTINUED] Cholecalciferol (VITAMIN D3) 125 MCG (5000 UT) CAPS Take 1 capsule (5,000 Units total) by mouth daily with breakfast. Take along with calcium and magnesium. (Patient not taking: Reported on 01/27/2021)   No facility-administered encounter medications on file as of 12/21/2021.    Review of Systems  Constitutional:  Negative for appetite change and fever.  HENT:  Negative for sore throat.   Eyes: Negative.   Respiratory:  Negative for shortness of breath and wheezing.   Cardiovascular:  Negative for chest pain and leg swelling.  Gastrointestinal:  Negative  for abdominal distention, diarrhea and vomiting.  Genitourinary:  Negative for dysuria, frequency and urgency.  Skin:  Negative for color change.  Neurological:  Negative for dizziness and headaches.  Psychiatric/Behavioral:  Negative for behavioral problems and sleep disturbance. The patient is not nervous/anxious.       Immunization History  Administered Date(s) Administered   Influenza,inj,Quad PF,6+ Mos 09/01/2016, 08/29/2017   Influenza-Unspecified 08/18/2021   Tdap 08/20/2017   Unspecified SARS-COV-2 Vaccination 01/19/2020, 02/16/2020, 08/18/2020   Pertinent  Health Maintenance Due  Topic Date Due   FOOT EXAM  Never done   OPHTHALMOLOGY EXAM  Never done   URINE MICROALBUMIN  Never done   HEMOGLOBIN A1C  12/30/2021   INFLUENZA VACCINE  Completed   Fall Risk 12/04/2021 12/04/2021 12/05/2021 12/10/2021 12/11/2021  Falls in the past year? - - - - -  Was there an injury with Fall? - - - - -  Fall Risk Category Calculator - - - - -  Fall Risk Category - - - - -  Patient Fall Risk Level High fall risk High fall risk High fall risk High fall risk High fall risk  Patient at Risk for Falls Due to - - - - -  Fall risk Follow up - - - - -     Vitals:   12/21/21 1112  BP: 114/66  Resp: 20  Temp: (!) 97.5 F (36.4 C)  Weight: 229 lb (103.9 kg)  Height: '5\' 7"'  (1.702 m)   Body mass index is 35.87 kg/m.  Physical Exam Constitutional:      Appearance: He is obese.  HENT:     Head: Normocephalic and atraumatic.     Mouth/Throat:     Mouth: Mucous membranes are moist.  Eyes:     Conjunctiva/sclera: Conjunctivae normal.  Cardiovascular:     Rate and Rhythm: Normal rate and regular rhythm.     Pulses: Normal pulses.     Heart sounds: Normal heart sounds.  Pulmonary:     Effort: Pulmonary effort is normal.     Breath sounds: Normal breath sounds.  Abdominal:     General: Bowel sounds are normal.     Palpations: Abdomen is soft.  Musculoskeletal:        General: No  swelling. Normal range of motion.     Cervical back:  Normal range of motion.     Right lower leg: No edema.     Left lower leg: No edema.     Comments: Left hip pain, 10/10  Skin:    General: Skin is warm and dry.  Neurological:     Mental Status: He is alert.     Comments: Alert to self, disoriented to time and place.  Psychiatric:        Mood and Affect: Mood normal.        Behavior: Behavior normal.       Labs reviewed: Recent Labs    01/10/21 0245 01/13/21 0718 02/15/21 0511 02/16/21 0457 06/20/21 0803 11/24/21 0509 11/25/21 0340 12/03/21 0457 12/04/21 0303 12/05/21 0333 12/07/21 0000 12/16/21 0000  NA 140   < > 134* 135   < > 133*   < > 136 132* 135 137 139  K 3.8   < > 4.8 4.8   < > 4.4   < > 4.3 4.4 4.5 4.8 3.9  CL 106   < > 103 103   < > 99   < > 107 105 108 106 103  CO2 24   < > 25 25   < > 22   < > 22 20* 21* 21 20  GLUCOSE 98   < > 91 92   < > 82   < > 91 84 90  --   --   BUN 33*   < > 46* 48*   < > 24*   < > 24* '21 19 16 ' 27*  CREATININE 2.31*   < > 2.18* 2.16*   < > 2.35*   < > 2.20* 2.20* 2.17* 2.1* 2.3*  CALCIUM 8.2*   < > 8.4* 8.4*   < > 8.2*   < > 7.9* 8.1* 7.9* 8.2* 8.2*  MG 2.1   < > 2.0 2.0  --  1.5*  --   --   --   --   --   --   PHOS 4.6  --   --   --   --   --   --   --   --   --   --   --    < > = values in this interval not displayed.   Recent Labs    02/11/21 0946 02/12/21 0537 06/20/21 1025 09/02/21 1418  AST 33 28 18  --   ALT 55* 43 9  --   ALKPHOS 111 97 123  --   BILITOT 1.1 0.7 0.6  --   PROT 6.9 6.1* 8.0  --   ALBUMIN 2.9* 2.5* 3.5 3.2*   Recent Labs    11/26/21 7622 11/29/21 0643 12/03/21 0457 12/04/21 0303 12/05/21 0333 12/07/21 0000 12/16/21 0000  WBC 10.7*   < > 9.2 7.3 5.8 6.4 6.2  NEUTROABS 8.3*  --   --   --   --  4.30 3.40  HGB 8.4*   < > 8.6* 7.8* 7.8* 8.7* 8.6*  HCT 25.8*   < > 24.9* 23.8* 24.0* 27* 25*  MCV 82.7   < > 80.8 Nicholas.0 82.2  --   --   PLT 186   < > 289 308 319 381 378   < > = values in this  interval not displayed.   Lab Results  Component Value Date   TSH 1.388 02/11/2021   Lab Results  Component Value Date   HGBA1C 6.1 (H) 06/29/2021   Lab Results  Component Value Date   CHOL 105 08/26/2021   HDL 38 (L) 08/26/2021   LDLCALC 52 08/26/2021   TRIG 70 08/26/2021   CHOLHDL 2.8 08/26/2021    Significant Diagnostic Results in last 30 days:  US Venous Img Upper Uni Right(DVT)  Result Date: 11/29/2021 CLINICAL DATA:  Right arm swelling EXAM: RIGHT UPPER EXTREMITY VENOUS DOPPLER ULTRASOUND TECHNIQUE: Gray-scale sonography with graded compression, as well as color Doppler and duplex ultrasound were performed to evaluate the upper extremity deep venous system from the level of the subclavian vein and including the jugular, axillary, basilic, radial, ulnar and upper cephalic vein. Spectral Doppler was utilized to evaluate flow at rest and with distal augmentation maneuvers. COMPARISON:  None. FINDINGS: Contralateral Subclavian Vein: Respiratory phasicity is normal and symmetric with the symptomatic side. No evidence of thrombus. Normal compressibility. Internal Jugular Vein: No evidence of thrombus. Normal compressibility, respiratory phasicity and response to augmentation. Subclavian Vein: No evidence of thrombus. Normal compressibility, respiratory phasicity and response to augmentation. Axillary Vein: No evidence of thrombus. Normal compressibility, respiratory phasicity and response to augmentation. Cephalic Vein: No evidence of thrombus. Normal compressibility, respiratory phasicity and response to augmentation. Basilic Vein: No evidence of thrombus. Normal compressibility, respiratory phasicity and response to augmentation. Brachial Veins: No evidence of thrombus. Normal compressibility, respiratory phasicity and response to augmentation. Radial Veins: No evidence of thrombus. Normal compressibility, respiratory phasicity and response to augmentation. Ulnar Veins: No evidence of  thrombus. Normal compressibility, respiratory phasicity and response to augmentation. IMPRESSION: No evidence of DVT within the right upper extremity. Electronically Signed   By: Jerilynn Mages.  Shick M.D.   On: 11/29/2021 13:57   DG Chest Portable 1 View  Result Date: 11/23/2021 CLINICAL DATA:  Weakness, evaluate for edema EXAM: PORTABLE CHEST 1 VIEW COMPARISON:  Chest radiograph 06/20/2021 FINDINGS: The heart is enlarged. The upper mediastinal contours are within normal limits. There are diffusely increased interstitial markings bilaterally likely reflecting mild pulmonary interstitial edema. There is no focal consolidation. There is no pleural effusion or pneumothorax There is no acute osseous abnormality. IMPRESSION: Cardiomegaly with mild pulmonary interstitial edema. Electronically Signed   By: Valetta Mole M.D.   On: 11/23/2021 11:44   CT Renal Stone Study  Result Date: 11/23/2021 CLINICAL DATA:  Flank pain, kidney stone suspected. Urinary retention. EXAM: CT ABDOMEN AND PELVIS WITHOUT CONTRAST TECHNIQUE: Multidetector CT imaging of the abdomen and pelvis was performed following the standard protocol without IV contrast. COMPARISON:  CT abdomen and pelvis 08/05/2017 FINDINGS: Lower chest: Mild bibasilar lung scarring. No pleural effusion. Three-vessel coronary atherosclerosis. Hepatobiliary: No focal liver abnormality is seen. No gallstones, gallbladder wall thickening, or biliary dilatation. Pancreas: Unremarkable. Spleen: Calcified granulomas. Adrenals/Urinary Tract: Unremarkable adrenal glands. Bilateral low-density renal lesions compatible with cysts measuring up to 5.1 cm on the right and 4.4 cm on the left with some of the cysts having at most mildly enlarged since 2018. No renal calculi or hydronephrosis. Vascular calcifications in both renal hila. No ureteral calculi or ureteral dilatation. Foley catheter in the bladder which is largely collapsed limiting evaluation. Stomach/Bowel: The stomach is  unremarkable. There is no evidence of bowel obstruction or inflammation. Predominantly left-sided colonic diverticulosis is noted without evidence of acute diverticulitis. The appendix is unremarkable. Vascular/Lymphatic: Abdominal aortic atherosclerosis without aneurysm. No enlarged lymph nodes. Reproductive: Partially visualized penile implant with reservoir in the left pelvis. Unremarkable prostate. Other: No ascites or pneumoperitoneum. Musculoskeletal: Right hip arthroplasty. ORIF of the left femur with extensive erosion/resorption of the left femoral head,  largely new from 2018 but similar in appearance to 02/11/2021 pelvic radiographs. IMPRESSION: 1. No acute abnormality identified in the abdomen or pelvis. No hydronephrosis. 2. Bilateral renal cysts. 3. Colonic diverticulosis. 4. Aortic Atherosclerosis (ICD10-I70.0). Electronically Signed   By: Logan Bores M.D.   On: 11/23/2021 11:19   DG HIP UNILAT WITH PELVIS 2-3 VIEWS LEFT  Result Date: 11/28/2021 CLINICAL DATA:  Left hip pain after fall several months ago EXAM: DG HIP (WITH OR WITHOUT PELVIS) 2-3V LEFT COMPARISON:  01/08/2021, 11/23/2021 FINDINGS: Frontal view of the pelvis as well as frontal and frogleg lateral views of the left hip are obtained. Right hip arthroplasty is partially visualized, unchanged in appearance. Penile prosthesis again noted. There is a plate and screw fixation across the proximal left femur again noted. There has been progressive erosive change of the left femoral head and neck, as well as the left acetabulum. 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 There is concern for underlying infection, hip aspiration could be performed. There are no acute displaced fractures. The remainder of the bony pelvis is unremarkable. IMPRESSION: 1. Progressive bony destruction across the left hip joint, which could reflect progressive particle disease based on prior  imaging findings. Given the associated left hip effusion seen on prior CT and MRI, aspiration could be performed if septic arthritis is a concern. 2. Stable right hip arthroplasty. 3. No acute fracture. These results will be called to the ordering clinician or representative by the Radiologist Assistant, and communication documented in the PACS or [employer]. Electronically Signed   By: Randa Ngo M.D.   On: 11/28/2021 16:37    Assessment/Plan  1. Coronary artery disease involving native coronary artery of native heart with angina pectoris ([place]) -   The current medical regimen is effective;  continue present plan and medications.  2. Mixed simple and mucopurulent chronic bronchitis (HCC) -  no SOB/wheezing The current medical regimen is effective;  continue present plan and medications.  3. Chronic left hip pain -   continue Oxycodone PRN  4. CKD (chronic kidney disease), stage IV (HCC) Lab Results  Component Value Date   NA 139 12/16/2021   K 3.9 12/16/2021   CO2 20 12/16/2021   GLUCOSE 90 12/05/2021   BUN 27 (A) 12/16/2021   CREATININE 2.3 (A) 12/16/2021   CALCIUM 8.2 (A) 12/16/2021   EGFR 29 (L) 09/02/2021   GFRNONAA 24.73 12/16/2021   -  will monitor  5. Bipolar disorder with psychotic features ([place])  -  mood is stable, continue Seroquel 50 mg 1 tab at bedtime -   follow up with psych      Family/ staff Communication: Discussed plan of care with resident and charge nurse  Labs/tests ordered:  None    Durenda Age, DNP, MSN, FNP-BC [facility] [phone] (Monday-Friday 8:00 a.m. - 5:00 p.m.) [phone] (after hours)

## 2021-12-23 ENCOUNTER — Other Ambulatory Visit: Payer: HMO

## 2021-12-26 ENCOUNTER — Ambulatory Visit: Payer: HMO | Admitting: Urology

## 2021-12-26 LAB — BASIC METABOLIC PANEL
BUN: 36 — AB (ref 4–21)
CO2: 23 — AB (ref 13–22)
Chloride: 100 (ref 99–108)
Creatinine: 2.2 — AB (ref 0.6–1.3)
Glucose: 90
Potassium: 3.8 (ref 3.4–5.3)
Sodium: 134 — AB (ref 137–147)

## 2021-12-26 LAB — COMPREHENSIVE METABOLIC PANEL
Calcium: 8.6 — AB (ref 8.7–10.7)
GFR calc Af Amer: 30.39
GFR calc non Af Amer: 26.22

## 2021-12-27 ENCOUNTER — Encounter: Payer: Self-pay | Admitting: Urology

## 2021-12-30 ENCOUNTER — Encounter: Payer: Self-pay | Admitting: Adult Health

## 2021-12-30 ENCOUNTER — Non-Acute Institutional Stay (SKILLED_NURSING_FACILITY): Payer: HMO | Admitting: Adult Health

## 2021-12-30 DIAGNOSIS — I25119 Atherosclerotic heart disease of native coronary artery with unspecified angina pectoris: Secondary | ICD-10-CM | POA: Diagnosis not present

## 2021-12-30 DIAGNOSIS — I10 Essential (primary) hypertension: Secondary | ICD-10-CM

## 2021-12-30 DIAGNOSIS — J418 Mixed simple and mucopurulent chronic bronchitis: Secondary | ICD-10-CM

## 2021-12-30 DIAGNOSIS — N401 Enlarged prostate with lower urinary tract symptoms: Secondary | ICD-10-CM | POA: Diagnosis not present

## 2021-12-30 DIAGNOSIS — R338 Other retention of urine: Secondary | ICD-10-CM

## 2021-12-30 DIAGNOSIS — F1097 Alcohol use, unspecified with alcohol-induced persisting dementia: Secondary | ICD-10-CM

## 2021-12-30 DIAGNOSIS — I5022 Chronic systolic (congestive) heart failure: Secondary | ICD-10-CM

## 2021-12-30 NOTE — Progress Notes (Signed)
Location:  [facility] Room Number: 027-X Place of Service:  SNF (31) Provider:  Durenda Age, DNP, FNP-BC  Patient Care Team: Pcp, No as PCP - General [name], MD as PCP - Cardiology (Cardiology) Alisa Graff, FNP as Nurse Practitioner (Family Medicine) [name], MD as Consulting Physician (Cardiology) Erby Pian, MD as Referring Physician (Specialist)  Extended Emergency Contact Information Primary Emergency Contact: [name] Mobile Phone: [phone] Relation: Daughter Secondary Emergency Contact: [name] [place] Phone: [phone] Relation: Daughter  Code Status:  DNR  Goals of care: Advanced Directive information Advanced Directives 12/30/2021  Does Patient Have a Medical Advance Directive? Yes  Type of Paramedic of [place];Out of facility DNR (pink MOST or yellow form)  Does patient want to make changes to medical advance directive? No - Patient declined  Copy of [place] in Chart? Yes - validated most recent copy scanned in chart (See row information)  Would patient like information on creating a medical advance directive? -  Pre-existing out of facility DNR order (yellow form or pink MOST form) Yellow form placed in chart (order not valid for inpatient use)     Chief Complaint  Patient presents with   Acute Visit    Short term rehabilitation     HPI:  [name] is a 87 y.o. male seen today for short-term rehabilitation visit. He is currently having [name], OT and ST.  Essential hypertension  SBPs ranging from 96 to 121, takes Coreg 6.25 mg  1 tab BID                                            Coronary artery disease involving native coronary artery of native heart with angina pectoris (HCC)  -  no chest pains, takes Plavix 75 mg daily , NTG PRN for chest pain, Lipitor 40 mg at bedtime and Isosorbide MN ER 30 mg daily  Benign  prostatic hyperplasia with urinary retention -  denies urinary retention, takes Flomax 0.4 mg 1 capsule daily   Past Medical History:  Diagnosis Date   Alcoholism ([place])    Asthma    Bipolar affective disorder ([place])    Chronic combined systolic and diastolic CHF (congestive heart failure) ([place])    a. 08/2016 Echo: EF 40-45%, mild AS, mild to mod MR, mildly dil LA/RA, mild-mod TR; b. 07/2017 Echo: EF 40-45%, mod LVH, Gr1 DD, mild to mod AS, mildly dil LA, nl RV fxn; c. 03/2019 Echo: EF 45-50%, AS (not severe). Mod dil PA.   CKD (chronic kidney disease), stage III (HCC)    COPD (chronic obstructive pulmonary disease) (HCC)    Coronary artery disease    a. 01/2004 s/p PCI and Taxus DES to dRCA (3.5 x 12 mm); b. 07/2017 Lexiscan MV: no ischemia. Sm area of apicl thinning, likely attenuation. EF 33% (GI uptake noted)-->Low risk; c. 03/2019 Inf STEMI/PCI: LM nl, LAD min irregs, D1 20ost, RI 20ost, LCX nl, RCA 90p/76m (4.0x26 Resolute Onyx DES), 25m (4.0x15 Resolute Onyx DES), 10d ISR.   Degenerative joint disease    knees and hip   Dyspnea    on exertion   Essential hypertension    Hyperlipidemia    Hypertension    controlled on meds   Ischemic cardiomyopathy    a. 08/2016 Echo: EF 40-45%; b.  07/2017 Echo: EF 40-45%; c. 03/2019 Echo: EF 45-50%.   OSA (obstructive sleep apnea)    Prostate CA (HCC)    prostate ca dx 20 yrs ago   PVD (peripheral vascular disease) ([place])    Tobacco abuse    Past Surgical History:  Procedure Laterality Date   CARDIAC CATHETERIZATION     CORONARY ANGIOGRAPHY N/A 04/01/2019   Procedure: CORONARY ANGIOGRAPHY;  Surgeon: Nelva Bush, MD;  Location: [facility];  Service: Cardiovascular;  Laterality: N/A;   CORONARY ANGIOPLASTY WITH STENT PLACEMENT     x2   CORONARY/GRAFT ACUTE MI REVASCULARIZATION N/A 04/01/2019   Procedure: Coronary/Graft Acute MI Revascularization;  Surgeon: Nelva Bush, MD;  Location: [facility];  Service: Cardiovascular;   Laterality: N/A;   TOTAL HIP ARTHROPLASTY     right    Allergies  Allergen Reactions   Chlorthalidone Other (See Comments)    Hyponatremia   Ace Inhibitors Cough   Hydrochlorothiazide    Lisinopril Other (See Comments)   Losartan    Other Other (See Comments)    "ANY BLOOD PRESSURE MEDICATIONS THAT I'VE TRIED" - [name]. DOES NOT REMEMBER WHICH ONES "ANY BLOOD PRESSURE MEDICATIONS THAT I'VE TRIED"- [name]. DOES NOT REMEMBER WHICH ONES- CONSTIPATION   Benazepril-Hydrochlorothiazide Other (See Comments)    Constipation    Outpatient Encounter Medications as of 12/30/2021  Medication Sig   ADULT ASPIRIN REGIMEN 81 MG EC tablet TAKE 1 TABLET BY MOUTH DAILY   albuterol (VENTOLIN HFA) 108 (90 Base) MCG/ACT inhaler USE 2 PUFFS EVERY 4 HOURS AS NEEDED   Amino Acids-Protein Hydrolys (PRO-STAT PO) Take 30 mLs by mouth 2 (two) times daily.   atorvastatin (LIPITOR) 40 MG tablet Take 40 mg by mouth daily.   azelastine (ASTELIN) 0.1 % nasal spray Place 1 spray into both nostrils daily as needed for allergies.   bisacodyl (DULCOLAX) 10 MG suppository Place 10 mg rectally as needed for moderate constipation.   calcium carbonate (OSCAL) 1500 (600 Ca) MG TABS tablet Take 1 tablet (1,500 mg total) by mouth 2 (two) times daily with a meal.   carvedilol (COREG) 6.25 MG tablet TAKE 1 TABLET BY MOUTH TWICE A DAY   clopidogrel (PLAVIX) 75 MG tablet Take 1 tablet (75 mg total) by mouth daily.   feeding supplement (ENSURE ENLIVE / ENSURE PLUS) LIQD Take 237 mLs by mouth 3 (three) times daily between meals.   fluticasone (FLONASE) 50 MCG/ACT nasal spray Place 2 sprays into both nostrils daily.   furosemide (LASIX) 40 MG tablet TAKE 1 TABLET BY MOUTH DAILY AND TAKE ANADDITIONAL TABLET IN THE AFTERNOON AS NEEDED FOR WEIGHT GAIN AND LEG EDEMA   ipratropium (ATROVENT) 0.03 % nasal spray Place 2 sprays into both nostrils daily.   isosorbide mononitrate (IMDUR) 30 MG 24 hr tablet Take 30 mg by mouth daily.   lubiprostone  (AMITIZA) 8 MCG capsule Take 8 mcg by mouth 2 (two) times daily.   Magnesium Hydroxide (MILK OF MAGNESIA PO) Take 30 mLs by mouth as needed.   Magnesium Oxide 500 MG CAPS Take 1 capsule (500 mg total) by mouth daily.   melatonin 5 MG TABS Take 5 mg by mouth at bedtime.   nitroGLYCERIN (NITROSTAT) 0.4 MG SL tablet Take 0.4 mg by mouth every 5 (five) minutes x 3 doses as needed for chest pain. As needed for chest pain   NON FORMULARY Mech soft diet without theraputic retriction   oxyCODONE (OXY IR/ROXICODONE) 5 MG immediate release tablet Take 1 tablet (5  mg total) by mouth every 8 (eight) hours as needed for severe pain. Must last 30 days.   QUEtiapine (SEROQUEL) 50 MG tablet Take 50 mg by mouth at bedtime.   Sodium Phosphates (RA SALINE ENEMA RE) Place 1 Dose rectally as needed.   tamsulosin (FLOMAX) 0.4 MG CAPS capsule Take 0.4 mg by mouth daily.   TRELEGY ELLIPTA 200-62.5-25 MCG/INH AEPB Inhale 1 puff into the lungs daily.    [DISCONTINUED] Cholecalciferol (VITAMIN D3) 125 MCG (5000 UT) CAPS Take 1 capsule (5,000 Units total) by mouth daily with breakfast. Take along with calcium and magnesium. (Patient not taking: Reported on 01/27/2021)   No facility-administered encounter medications on file as of 12/30/2021.    Review of Systems  Constitutional:  Negative for activity change, appetite change and fever.  HENT:  Negative for sore throat.   Eyes: Negative.   Cardiovascular:  Negative for chest pain and leg swelling.  Gastrointestinal:  Negative for abdominal distention, diarrhea and vomiting.  Genitourinary:  Negative for dysuria, frequency and urgency.  Skin:  Negative for color change.  Neurological:  Negative for dizziness and headaches.  Psychiatric/Behavioral:  Negative for behavioral problems and sleep disturbance. The patient is not nervous/anxious.      Immunization History  Administered Date(s) Administered   Influenza,inj,Quad PF,6+ Mos 09/01/2016, 08/29/2017    Influenza-Unspecified 08/18/2021   Tdap 08/20/2017   Unspecified SARS-COV-2 Vaccination 01/19/2020, 02/16/2020, 08/18/2020   Pertinent  Health Maintenance Due  Topic Date Due   FOOT EXAM  Never done   OPHTHALMOLOGY EXAM  Never done   URINE MICROALBUMIN  Never done   HEMOGLOBIN A1C  12/30/2021   INFLUENZA VACCINE  Completed   Fall Risk 12/04/2021 12/04/2021 12/05/2021 12/10/2021 12/11/2021  Falls in the past year? - - - - -  Was there an injury with Fall? - - - - -  Fall Risk Category Calculator - - - - -  Fall Risk Category - - - - -  Patient Fall Risk Level High fall risk High fall risk High fall risk High fall risk High fall risk  Patient at Risk for Falls Due to - - - - -  Fall risk Follow up - - - - -     Vitals:   12/30/21 1245  BP: 125/60  Pulse: 76  Resp: 18  Temp: (!) 97.2 F (36.2 C)  SpO2: 94%  Weight: 228 lb 9.6 oz (103.7 kg)  Height: 5\' 7"  (1.702 m)   Body mass index is 35.8 kg/m.  Physical Exam Constitutional:      Appearance: He is obese.  HENT:     Head: Normocephalic and atraumatic.     Mouth/Throat:     Mouth: Mucous membranes are moist.  Eyes:     Conjunctiva/sclera: Conjunctivae normal.  Cardiovascular:     Rate and Rhythm: Normal rate and regular rhythm.     Pulses: Normal pulses.     Heart sounds: Normal heart sounds.  Pulmonary:     Effort: Pulmonary effort is normal.     Breath sounds: Normal breath sounds.  Abdominal:     General: Bowel sounds are normal.     Palpations: Abdomen is soft.  Musculoskeletal:        General: No swelling. Normal range of motion.     Cervical back: Normal range of motion.  Skin:    General: Skin is warm and dry.  Neurological:     Mental Status: He is alert. Mental status is at baseline.  Comments: Alert to self, disoriented to time and place.  Psychiatric:        Mood and Affect: Mood normal.       Labs reviewed: Recent Labs    01/10/21 0245 01/13/21 0718 02/15/21 0511 02/16/21 0457  06/20/21 0803 11/24/21 0509 11/25/21 0340 12/03/21 0457 12/04/21 0303 12/05/21 0333 12/07/21 0000 12/16/21 0000 12/26/21 0000  NA 140   < > 134* 135   < > 133*   < > 136 132* 135 137 139 134*  K 3.8   < > 4.8 4.8   < > 4.4   < > 4.3 4.4 4.5 4.8 3.9 3.8  CL 106   < > 103 103   < > 99   < > 107 105 108 106 103 100  CO2 24   < > 25 25   < > 22   < > 22 20* 21* 21 20 23*  GLUCOSE 98   < > 91 92   < > 82   < > 91 84 90  --   --   --   BUN 33*   < > 46* 48*   < > 24*   < > 24* 21 19 16  27* 36*  CREATININE 2.31*   < > 2.18* 2.16*   < > 2.35*   < > 2.20* 2.20* 2.17* 2.1* 2.3* 2.2*  CALCIUM 8.2*   < > 8.4* 8.4*   < > 8.2*   < > 7.9* 8.1* 7.9* 8.2* 8.2* 8.6*  MG 2.1   < > 2.0 2.0  --  1.5*  --   --   --   --   --   --   --   PHOS 4.6  --   --   --   --   --   --   --   --   --   --   --   --    < > = values in this interval not displayed.   Recent Labs    02/11/21 0946 02/12/21 0537 06/20/21 1025 09/02/21 1418  AST 33 28 18  --   ALT 55* 43 9  --   ALKPHOS 111 97 123  --   BILITOT 1.1 0.7 0.6  --   PROT 6.9 6.1* 8.0  --   ALBUMIN 2.9* 2.5* 3.5 3.2*   Recent Labs    11/26/21 4481 11/29/21 0643 12/03/21 0457 12/04/21 0303 12/05/21 0333 12/07/21 0000 12/16/21 0000  WBC 10.7*   < > 9.2 7.3 5.8 6.4 6.2  NEUTROABS 8.3*  --   --   --   --  4.30 3.40  HGB 8.4*   < > 8.6* 7.8* 7.8* 8.7* 8.6*  HCT 25.8*   < > 24.9* 23.8* 24.0* 27* 25*  MCV 82.7   < > 80.8 81.0 82.2  --   --   PLT 186   < > 289 308 319 381 378   < > = values in this interval not displayed.   Lab Results  Component Value Date   TSH 1.388 02/11/2021   Lab Results  Component Value Date   HGBA1C 6.1 (H) 06/29/2021   Lab Results  Component Value Date   CHOL 105 08/26/2021   HDL 38 (L) 08/26/2021   LDLCALC 52 08/26/2021   TRIG 70 08/26/2021   CHOLHDL 2.8 08/26/2021    Significant Diagnostic Results in last 30 days:  No results found.  Assessment/Plan  1. Essential hypertension -  -  Blood pressure well  controlled Continue current medications  2. Coronary artery disease involving native coronary artery of native heart with angina pectoris (HCC) -  stable, continue Isosorbide MN, Plavix, NTG PRN and Lipitor  3. Benign prostatic hyperplasia with urinary retention -  stable, continue Flomax  4. Mixed simple and mucopurulent chronic bronchitis (HCC) -  no SOB/wheezing, continue Trelegy ans Albuterol PRN  5. Chronic systolic CHF (congestive heart failure) (HCC) -  stable, continue Lasix  6. Dementia associated with alcoholism, with psychotic disturbance, unspecified dementia severity ([place]) -  BIMS score 12/15, ranging in moderate cognitive deficit -  continue supportive care    Family/ staff Communication: Discussed plan of care with resident and charge nurse  Labs/tests ordered: None    Durenda Age, DNP, MSN, FNP-BC [facility] [phone] (Monday-Friday 8:00 a.m. - 5:00 p.m.) [phone] (after hours)

## 2021-12-31 ENCOUNTER — Emergency Department (HOSPITAL_COMMUNITY): Payer: HMO

## 2021-12-31 ENCOUNTER — Other Ambulatory Visit: Payer: Self-pay

## 2021-12-31 ENCOUNTER — Emergency Department (HOSPITAL_COMMUNITY)
Admission: EM | Admit: 2021-12-31 | Discharge: 2021-12-31 | Disposition: A | Discharge status: Home / Self Care | Payer: HMO | Attending: Emergency Medicine | Admitting: Emergency Medicine

## 2021-12-31 ENCOUNTER — Encounter (HOSPITAL_COMMUNITY): Payer: Self-pay

## 2021-12-31 DIAGNOSIS — R519 Headache, unspecified: Secondary | ICD-10-CM | POA: Insufficient documentation

## 2021-12-31 DIAGNOSIS — N183 Chronic kidney disease, stage 3 unspecified: Secondary | ICD-10-CM | POA: Insufficient documentation

## 2021-12-31 DIAGNOSIS — W050XXA Fall from non-moving wheelchair, initial encounter: Secondary | ICD-10-CM | POA: Diagnosis not present

## 2021-12-31 DIAGNOSIS — Z8546 Personal history of malignant neoplasm of prostate: Secondary | ICD-10-CM | POA: Insufficient documentation

## 2021-12-31 DIAGNOSIS — Z87891 Personal history of nicotine dependence: Secondary | ICD-10-CM | POA: Insufficient documentation

## 2021-12-31 DIAGNOSIS — I2510 Atherosclerotic heart disease of native coronary artery without angina pectoris: Secondary | ICD-10-CM | POA: Diagnosis not present

## 2021-12-31 DIAGNOSIS — M25552 Pain in left hip: Secondary | ICD-10-CM | POA: Diagnosis not present

## 2021-12-31 DIAGNOSIS — J449 Chronic obstructive pulmonary disease, unspecified: Secondary | ICD-10-CM | POA: Diagnosis not present

## 2021-12-31 DIAGNOSIS — I129 Hypertensive chronic kidney disease with stage 1 through stage 4 chronic kidney disease, or unspecified chronic kidney disease: Secondary | ICD-10-CM | POA: Diagnosis not present

## 2021-12-31 DIAGNOSIS — Z79899 Other long term (current) drug therapy: Secondary | ICD-10-CM | POA: Diagnosis not present

## 2021-12-31 DIAGNOSIS — W19XXXA Unspecified fall, initial encounter: Secondary | ICD-10-CM

## 2021-12-31 LAB — CBC WITH DIFFERENTIAL/PLATELET
Abs Immature Granulocytes: 0.03 10*3/uL (ref 0.00–0.07)
Basophils Absolute: 0 10*3/uL (ref 0.0–0.1)
Basophils Relative: 0 %
Eosinophils Absolute: 0.2 10*3/uL (ref 0.0–0.5)
Eosinophils Relative: 4 %
HCT: 30.9 % — ABNORMAL LOW (ref 39.0–52.0)
Hemoglobin: 10.1 g/dL — ABNORMAL LOW (ref 13.0–17.0)
Immature Granulocytes: 0 %
Lymphocytes Relative: 22 %
Lymphs Abs: 1.5 10*3/uL (ref 0.7–4.0)
MCH: 27.5 pg (ref 26.0–34.0)
MCHC: 32.7 g/dL (ref 30.0–36.0)
MCV: 84.2 fL (ref 80.0–100.0)
Monocytes Absolute: 0.6 10*3/uL (ref 0.1–1.0)
Monocytes Relative: 9 %
Neutro Abs: 4.3 10*3/uL (ref 1.7–7.7)
Neutrophils Relative %: 65 %
Platelets: 248 10*3/uL (ref 150–400)
RBC: 3.67 MIL/uL — ABNORMAL LOW (ref 4.22–5.81)
RDW: 15.7 % — ABNORMAL HIGH (ref 11.5–15.5)
WBC: 6.7 10*3/uL (ref 4.0–10.5)
nRBC: 0 % (ref 0.0–0.2)

## 2021-12-31 LAB — BASIC METABOLIC PANEL
Anion gap: 13 (ref 5–15)
BUN: 42 mg/dL — ABNORMAL HIGH (ref 8–23)
CO2: 23 mmol/L (ref 22–32)
Calcium: 8.9 mg/dL (ref 8.9–10.3)
Chloride: 101 mmol/L (ref 98–111)
Creatinine, Ser: 2.62 mg/dL — ABNORMAL HIGH (ref 0.61–1.24)
GFR, Estimated: 23 mL/min — ABNORMAL LOW (ref >60)
Glucose, Bld: 99 mg/dL (ref 70–99)
Potassium: 3.9 mmol/L (ref 3.5–5.1)
Sodium: 137 mmol/L (ref 135–145)

## 2021-12-31 NOTE — ED Notes (Signed)
RN reviewed discharge instructions with transport. VSS upon discharge.

## 2021-12-31 NOTE — ED Provider Notes (Signed)
[facility] EMERGENCY DEPARTMENT Provider Note   CSN: [id number] Arrival date & time: 12/31/21  2018     History  Chief Complaint  Patient presents with   [name]    [name] is [name] 87 y.o. male with past medical history significant for COPD, HTN, PE, OSA, tobacco abuse, CKD 3, CAD s/p PCI, HFrEF (EF 45 to 50% 04/01/2019), prostate cancer treated with radiation therapy (1994) s/p penile prosthesis (2010) and revision (2014) who presents after [name] fall.  The patient was reportedly at rehab and was trying to transfer from his bed to his wheelchair when the wheelchair slipped out from behind him and he fell backwards onto his left side and hit his head.  He denies loss of consciousness.  He has been unable to ambulate after the fall.  He is currently complaining of headache and left hip pain radiating into his groin.     Home Medications Prior to Admission medications   Medication Sig Start Date End Date Taking? Authorizing Provider  ADULT ASPIRIN REGIMEN 81 MG EC tablet TAKE 1 TABLET BY MOUTH DAILY 03/08/20   Nicholas Hampshire, MD  albuterol (VENTOLIN HFA) 108 (90 Base) MCG/ACT inhaler USE 2 PUFFS EVERY 4 HOURS AS NEEDED 10/14/19   Nicholas Price A, FNP  Amino Acids-Protein Hydrolys (PRO-STAT PO) Take 30 mLs by mouth 2 (two) times daily.    [provider]  atorvastatin (LIPITOR) 40 MG tablet Take 40 mg by mouth daily.    [provider]  azelastine (ASTELIN) 0.1 % nasal spray Place 1 spray into both nostrils daily as needed for allergies.    [provider]  bisacodyl (DULCOLAX) 10 MG suppository Place 10 mg rectally as needed for moderate constipation.    [provider]  calcium carbonate (OSCAL) 1500 (600 Ca) MG TABS tablet Take 1 tablet (1,500 mg total) by mouth 2 (two) times daily with [name] meal. 11/29/20 12/18/48  Nicholas Pointer, MD  carvedilol (COREG) 6.25 MG tablet TAKE 1 TABLET BY MOUTH TWICE [name] DAY 08/15/21   Nicholas Hampshire, MD   clopidogrel (PLAVIX) 75 MG tablet Take 1 tablet (75 mg total) by mouth daily. 10/28/19   Nicholas Hampshire, MD  feeding supplement (ENSURE ENLIVE / ENSURE PLUS) LIQD Take 237 mLs by mouth 3 (three) times daily between meals. 01/13/21   Nicholas Nimrod, MD  fluticasone (FLONASE) 50 MCG/ACT nasal spray Place 2 sprays into both nostrils daily.    [provider]  furosemide (LASIX) 40 MG tablet TAKE 1 TABLET BY MOUTH DAILY AND TAKE ANADDITIONAL TABLET IN THE AFTERNOON AS NEEDED FOR WEIGHT GAIN AND LEG EDEMA 04/21/21   Nicholas Hampshire, MD  ipratropium (ATROVENT) 0.03 % nasal spray Place 2 sprays into both nostrils daily. 05/07/20   [provider]  isosorbide mononitrate (IMDUR) 30 MG 24 hr tablet Take 30 mg by mouth daily. 10/26/21   [provider]  lubiprostone (AMITIZA) 8 MCG capsule Take 8 mcg by mouth 2 (two) times daily. 08/24/21   [provider]  Magnesium Hydroxide (MILK OF MAGNESIA PO) Take 30 mLs by mouth as needed.    [provider]  Magnesium Oxide 500 MG CAPS Take 1 capsule (500 mg total) by mouth daily. 11/29/20 12/18/48  Nicholas Pointer, MD  melatonin 5 MG TABS Take 5 mg by mouth at bedtime.    [provider]  nitroGLYCERIN (NITROSTAT) 0.4 MG SL tablet Take 0.4 mg by mouth every 5 (five) minutes x 3  doses as needed for chest pain. As needed for chest pain 12/06/15   [provider]  NON FORMULARY Mech soft diet without theraputic retriction    [provider]  oxyCODONE (OXY IR/ROXICODONE) 5 MG immediate release tablet Take 1 tablet (5 mg total) by mouth every 8 (eight) hours as needed for severe pain. Must last 30 days. 12/13/21   Medina-Vargas, Monina C, NP  QUEtiapine (SEROQUEL) 50 MG tablet Take 50 mg by mouth at bedtime.    [provider]  Sodium Phosphates (RA SALINE ENEMA RE) Place 1 Dose rectally as needed.    [provider]  tamsulosin (FLOMAX) 0.4 MG CAPS capsule Take 0.4 mg by mouth daily.     [provider]  [name] 200-62.5-25 MCG/INH AEPB Inhale 1 puff into the lungs daily.  04/08/20   [provider]  Cholecalciferol (VITAMIN D3) 125 MCG (5000 UT) CAPS Take 1 capsule (5,000 Units total) by mouth daily with breakfast. Take along with calcium and magnesium. Patient not taking: Reported on 01/27/2021 11/29/20 01/28/21  Nicholas Pointer, MD      Allergies    Chlorthalidone, Ace inhibitors, Hydrochlorothiazide, Lisinopril, Losartan, Other, and Benazepril-hydrochlorothiazide    Review of Systems   Review of Systems  Eyes:  Negative for visual disturbance.  Respiratory:  Negative for chest tightness and shortness of breath.   Cardiovascular:  Negative for chest pain.  Gastrointestinal:  Negative for abdominal pain, nausea and vomiting.  Genitourinary:  Negative for flank pain.  Musculoskeletal:  Positive for arthralgias. Negative for neck pain.  Skin:  Negative for wound.  Neurological:  Positive for headaches.   Physical Exam Updated Vital Signs BP (!) 144/63    Pulse 78    Temp 98.2 F (36.8 [name]) (Oral)    Resp 17    Ht 5\' 7"  (1.702 m)    Wt 103 kg    SpO2 98%    BMI 35.56 kg/m  Physical Exam Vitals and nursing note reviewed.  Constitutional:      General: He is not in acute distress.    Appearance: He is well-developed. He is obese.  HENT:     Head: Normocephalic and atraumatic.     Right Ear: External ear normal.     Left Ear: External ear normal.  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     Pulses: Normal pulses.     Heart sounds: Normal heart sounds. No murmur heard. Pulmonary:     Effort: Pulmonary effort is normal. No respiratory distress.     Breath sounds: Normal breath sounds.  Abdominal:     Palpations: Abdomen is soft.     Tenderness: There is no abdominal tenderness. There is no guarding or rebound.   Musculoskeletal:        General: Tenderness (L hip) present. No swelling or deformity.     Cervical back: Neck supple.  Skin:    General: Skin is warm and dry.     Capillary Refill: Capillary refill takes less than 2 seconds.  Neurological:     General: No focal deficit present.     Mental Status: He is alert and oriented to person, place, and time.  Psychiatric:        Mood and Affect: Mood normal.    ED Results / Procedures / Treatments   Labs (all labs ordered are  listed, but only abnormal results are displayed) Labs Reviewed  CBC WITH DIFFERENTIAL/PLATELET - Abnormal; Notable for the following components:      Result Value   RBC 3.67 (*)    Hemoglobin 10.1 (*)    HCT 30.9 (*)    RDW 15.7 (*)    All other components within normal limits  BASIC METABOLIC PANEL - Abnormal; Notable for the following components:   BUN 42 (*)    Creatinine, Ser 2.62 (*)    GFR, Estimated 23 (*)    All other components within normal limits    EKG EKG Interpretation  Date/Time:  Saturday December 31 2021 20:43:28 EST Ventricular Rate:  79 PR Interval:  190 QRS Duration: 98 QT Interval:  416 QTC Calculation: 477 R Axis:   28 Text Interpretation: Sinus rhythm Supraventricular bigeminy Borderline prolonged QT interval Baseline wander in lead(s) II No significant change since last tracing Confirmed by Lacretia Leigh ([id number]) on 01/01/2022 10:05:01 AM  Radiology CT Head Wo Contrast  Result Date: 12/31/2021 CLINICAL DATA:  Status post fall. EXAM: CT HEAD WITHOUT CONTRAST TECHNIQUE: Contiguous axial images were obtained from the base of the skull through the vertex without intravenous contrast. RADIATION DOSE REDUCTION: This exam was performed according to the departmental dose-optimization program which includes automated exposure control, adjustment of the mA and/or kV according to patient size and/or use of iterative reconstruction technique. COMPARISON:  February 11, 2021 FINDINGS: Brain: There  is mild cerebral atrophy with widening of the extra-axial spaces and ventricular dilatation. There are areas of decreased attenuation within the white matter tracts of the supratentorial brain, consistent with microvascular disease changes. [name] stable, approximately 3.7 cm x 3.7 cm partially calcified meningioma is seen arising from the upper tentorium on the left (axial CT image 16, CT series 3). Vascular: No hyperdense vessel or unexpected calcification. Skull: Normal. Negative for fracture or focal lesion. Sinuses/Orbits: There is marked severity right maxillary sinus mucosal thickening, mildly increased in severity when compared to the prior study. Other: None. IMPRESSION: 1. No acute intracranial abnormality. 2. Stable, partially calcified meningioma arising from the upper tentorium on the left. 3. Mild cerebral atrophy and microvascular disease changes of the supratentorial brain. 4. Marked severity right maxillary sinus disease. Electronically Signed   By: Virgina Norfolk M.D.   On: 12/31/2021 21:44   CT Cervical Spine Wo Contrast  Result Date: 12/31/2021 CLINICAL DATA:  Status post fall. EXAM: CT CERVICAL SPINE WITHOUT CONTRAST TECHNIQUE: Multidetector CT imaging of the cervical spine was performed without intravenous contrast. Multiplanar CT image reconstructions were also generated. RADIATION DOSE REDUCTION: This exam was performed according to the departmental dose-optimization program which includes automated exposure control, adjustment of the mA and/or kV according to patient size and/or use of iterative reconstruction technique. COMPARISON:  June 12, 2009 FINDINGS: Alignment: Normal. Skull base and vertebrae: No acute fracture. No primary bone lesion or focal pathologic process. Soft tissues and spinal canal: No prevertebral fluid or swelling. No visible canal hematoma. Disc levels: Moderate severity multilevel endplate sclerosis is seen throughout all levels of the cervical spine. Marked severity  anterior osteophyte formation is seen at the level of C3-C4 and C7-T1. Mild to moderate severity anterior osteophyte formation is noted at C2-C3 and C4-C5. There is marked severity narrowing of the anterior atlantoaxial articulation with marked severity intervertebral disc space narrowing at the levels of C5-C6 and C6-C7. Moderate severity intervertebral disc space narrowing is seen at C7-T1. Bilateral, marked severity multilevel facet joint hypertrophy is noted. Upper  chest: Negative. Other: None. IMPRESSION: 1. No acute fracture or subluxation of the cervical spine. 2. Moderate to marked severity multilevel degenerative changes, as described above. Electronically Signed   By: Virgina Norfolk M.D.   On: 12/31/2021 21:51   DG Hip Unilat W or Wo Pelvis 2-3 Views Left  Result Date: 12/31/2021 CLINICAL DATA:  Left hip pain EXAM: DG HIP (WITH OR WITHOUT PELVIS) 2-3V LEFT COMPARISON:  11/28/2021 FINDINGS: Chronic deformity of the left femoral head with extensive osseous remodeling of the acetabulum, similar in appearance to the previous study. Prior proximal right femoral ORIF with intact hardware. No evidence of acute perihardware fracture. Alignment of the residual hip joint is unchanged. Right total hip arthroplasty without evidence of complication. Bones appear demineralized. Advanced atherosclerotic calcifications. IMPRESSION: Chronic deformity of the left hip with extensive osseous remodeling of the acetabulum, similar in appearance to the previous study. No acute osseous findings. Electronically Signed   By: Davina Poke D.O.   On: 12/31/2021 21:50    Procedures Procedures   Medications Ordered in ED Medications - No data to display  ED Course/ Medical Decision Making/ [name]&P                           Patient presents after [name] fall while transferring from his bed to wheelchair as described in HPI above.  On initial evaluation, patient is hemodynamically stable and saturating well on room air no  acute distress.  Physical exam is largely unremarkable with no obvious deformities or injuries.  Patient has tenderness to the left hip.  Given that he struck his head, we will order CT head and [name]-spine in addition to obtaining plain films of the left hip.  EKG reviewed by myself shows normal sinus rhythm with supraventricular bigeminy and wandering baseline with artifact.  No evidence of significant ST elevation or depression to suggest STEMI.  No significant changes compared to prior EKG.  Labs reviewed by myself show anemia similar to prior, elevated creatinine consistent with baseline.  Imaging reviewed by myself.  CT head and CT [name]-spine without evidence of traumatic injuries.  Left hip XR shows chronic deformity of left hip with extensive osseous remodeling, but no acute fracture.  Appears similar to prior imaging.  Given unremarkable work-up and imaging negative for any acute traumatic injuries, I believe the patient is appropriate for discharge home.  Discharge instruction and return precautions were discussed with the patient prior to discharge including the AVS.  Patient was then discharged in stable condition.  Final Clinical Impression(s) / ED Diagnoses Final diagnoses:  Fall, initial encounter    Rx / DC Orders ED Discharge Orders     None         Crestina Strike, Amalia Hailey, MD 01/01/22 1138    Luna Fuse, MD 01/01/22 2219

## 2021-12-31 NOTE — ED Triage Notes (Signed)
Pt was getting from wheel chair to bed feet got tangled and fell, c/o pain in left hip no shortening or rotation. No loss of consciousness. Pt hit his head no c/o pain to head, no anticoagulants on board.

## 2021-12-31 NOTE — ED Notes (Signed)
[facility] called  

## 2022-01-03 ENCOUNTER — Other Ambulatory Visit: Payer: HMO

## 2022-01-06 ENCOUNTER — Ambulatory Visit: Payer: HMO | Admitting: Cardiovascular Disease

## 2022-01-09 ENCOUNTER — Encounter: Payer: Self-pay | Admitting: Adult Health

## 2022-01-09 ENCOUNTER — Non-Acute Institutional Stay (INDEPENDENT_AMBULATORY_CARE_PROVIDER_SITE_OTHER): Payer: HMO | Admitting: Adult Health

## 2022-01-09 DIAGNOSIS — Z0000 Encounter for general adult medical examination without abnormal findings: Secondary | ICD-10-CM

## 2022-01-09 NOTE — Progress Notes (Signed)
Subjective:   [name] is a 87 y.o. male who presents for Medicare Annual/Subsequent preventive examination.  Review of Systems     Cardiac Risk Factors include: advanced age (>82men, >47 women);hypertension;obesity (BMI >30kg/m2);male gender;dyslipidemia     Objective:    Today's Vitals   01/09/22 1049 01/09/22 1527  BP: 107/60   Pulse: 76   Resp: 18   Temp: 97.6 F (36.4 C)   SpO2: 94%   Weight: 222 lb 3.2 oz (100.8 kg)   Height: 5\' 7"  (1.702 m)   PainSc:  10-Worst pain ever   Body mass index is 34.8 kg/m.  Advanced Directives 01/09/2022 12/31/2021 12/30/2021 12/21/2021 12/13/2021 12/10/2021 12/06/2021  Does Patient Have a Medical Advance Directive? Yes Yes Yes Yes Yes No Yes  Type of Paramedic of [place];Out of facility DNR (pink MOST or yellow form) Out of facility DNR (pink MOST or yellow form) [place];Out of facility DNR (pink MOST or yellow form) Healthcare Power of [place];Out of facility DNR (pink MOST or yellow form) Out of facility DNR (pink MOST or yellow form) [place];Out of facility DNR (pink MOST or yellow form)  Does patient want to make changes to medical advance directive? No - Patient declined - No - Patient declined No - Patient declined No - Patient declined - No - Patient declined  Copy of [place] in Chart? Yes - validated most recent copy scanned in chart (See row information) - Yes - validated most recent copy scanned in chart (See row information) Yes - validated most recent copy scanned in chart (See row information) Yes - validated most recent copy scanned in chart (See row information) - Yes - validated most recent copy scanned in chart (See row information)  Would patient like information on creating a medical advance directive? - - - - - - -  Pre-existing out of facility DNR order (yellow form or pink MOST form) Yellow form placed in  chart (order not valid for inpatient use) Pink Most/Yellow Form available - Physician notified to receive inpatient order Yellow form placed in chart (order not valid for inpatient use) Yellow form placed in chart (order not valid for inpatient use) Yellow form placed in chart (order not valid for inpatient use) - Yellow form placed in chart (order not valid for inpatient use)    Current Medications (verified) Outpatient Encounter Medications as of 01/09/2022  Medication Sig   ADULT ASPIRIN REGIMEN 81 MG EC tablet TAKE 1 TABLET BY MOUTH DAILY   albuterol (VENTOLIN HFA) 108 (90 Base) MCG/ACT inhaler USE 2 PUFFS EVERY 4 HOURS AS NEEDED   Amino Acids-Protein Hydrolys (PRO-STAT PO) Take 30 mLs by mouth 2 (two) times daily.   atorvastatin (LIPITOR) 40 MG tablet Take 40 mg by mouth daily.   azelastine (ASTELIN) 0.1 % nasal spray Place 1 spray into both nostrils daily as needed for allergies.   bisacodyl (DULCOLAX) 10 MG suppository Place 10 mg rectally as needed for moderate constipation.   calcium carbonate (OSCAL) 1500 (600 Ca) MG TABS tablet Take 1 tablet (1,500 mg total) by mouth 2 (two) times daily with a meal.   carvedilol (COREG) 6.25 MG tablet TAKE 1 TABLET BY MOUTH TWICE A DAY   clopidogrel (PLAVIX) 75 MG tablet Take 1 tablet (75 mg total) by mouth daily.   feeding supplement (ENSURE ENLIVE / ENSURE PLUS) LIQD Take 237 mLs by mouth 3 (three) times daily between  meals.   fluticasone (FLONASE) 50 MCG/ACT nasal spray Place 2 sprays into both nostrils daily.   furosemide (LASIX) 40 MG tablet TAKE 1 TABLET BY MOUTH DAILY AND TAKE ANADDITIONAL TABLET IN THE AFTERNOON AS NEEDED FOR WEIGHT GAIN AND LEG EDEMA   ipratropium (ATROVENT) 0.03 % nasal spray Place 2 sprays into both nostrils daily.   isosorbide mononitrate (IMDUR) 30 MG 24 hr tablet Take 30 mg by mouth daily.   lubiprostone (AMITIZA) 8 MCG capsule Take 8 mcg by mouth 2 (two) times daily.   Magnesium Hydroxide (MILK OF MAGNESIA PO) Take 30  mLs by mouth as needed.   Magnesium Oxide 500 MG CAPS Take 1 capsule (500 mg total) by mouth daily.   melatonin 5 MG TABS Take 5 mg by mouth at bedtime.   nitroGLYCERIN (NITROSTAT) 0.4 MG SL tablet Take 0.4 mg by mouth every 5 (five) minutes x 3 doses as needed for chest pain. As needed for chest pain   NON FORMULARY Mech soft diet without theraputic retriction   oxyCODONE (OXY IR/ROXICODONE) 5 MG immediate release tablet Take 1 tablet (5 mg total) by mouth every 8 (eight) hours as needed for severe pain. Must last 30 days.   QUEtiapine (SEROQUEL) 50 MG tablet Take 50 mg by mouth at bedtime.   Sodium Phosphates (RA SALINE ENEMA RE) Place 1 Dose rectally as needed.   tamsulosin (FLOMAX) 0.4 MG CAPS capsule Take 0.4 mg by mouth daily.   TRELEGY ELLIPTA 200-62.5-25 MCG/INH AEPB Inhale 1 puff into the lungs daily.    [DISCONTINUED] Cholecalciferol (VITAMIN D3) 125 MCG (5000 UT) CAPS Take 1 capsule (5,000 Units total) by mouth daily with breakfast. Take along with calcium and magnesium. (Patient not taking: Reported on 01/27/2021)   No facility-administered encounter medications on file as of 01/09/2022.    Allergies (verified) Chlorthalidone, Ace inhibitors, Hydrochlorothiazide, Lisinopril, Losartan, Other, and Benazepril-hydrochlorothiazide   History: Past Medical History:  Diagnosis Date   Alcoholism ([place])    Asthma    Bipolar affective disorder ([place])    Chronic combined systolic and diastolic CHF (congestive heart failure) ([place])    a. 08/2016 Echo: EF 40-45%, mild AS, mild to mod MR, mildly dil LA/RA, mild-mod TR; b. 07/2017 Echo: EF 40-45%, mod LVH, Gr1 DD, mild to mod AS, mildly dil LA, nl RV fxn; c. 03/2019 Echo: EF 45-50%, AS (not severe). Mod dil PA.   CKD (chronic kidney disease), stage III (HCC)    COPD (chronic obstructive pulmonary disease) (HCC)    Coronary artery disease    a. 01/2004 s/p PCI and Taxus DES to dRCA (3.5 x 12 mm); b. 07/2017 Lexiscan MV: no ischemia. Sm area of apicl  thinning, likely attenuation. EF 33% (GI uptake noted)-->Low risk; c. 03/2019 Inf STEMI/PCI: LM nl, LAD min irregs, D1 20ost, RI 20ost, LCX nl, RCA 90p/21m (4.0x26 Resolute Onyx DES), 3m (4.0x15 Resolute Onyx DES), 10d ISR.   Degenerative joint disease    knees and hip   Dyspnea    on exertion   Essential hypertension    Hyperlipidemia    Hypertension    controlled on meds   Ischemic cardiomyopathy    a. 08/2016 Echo: EF 40-45%; b. 07/2017 Echo: EF 40-45%; c. 03/2019 Echo: EF 45-50%.   OSA (obstructive sleep apnea)    Prostate CA (HCC)    prostate ca dx 20 yrs ago   PVD (peripheral vascular disease) (HCC)    Tobacco abuse    Past Surgical History:  Procedure Laterality Date  CARDIAC CATHETERIZATION     CORONARY ANGIOGRAPHY N/A 04/01/2019   Procedure: CORONARY ANGIOGRAPHY;  Surgeon: Nelva Bush, MD;  Location: [facility];  Service: Cardiovascular;  Laterality: N/A;   CORONARY ANGIOPLASTY WITH STENT PLACEMENT     x2   CORONARY/GRAFT ACUTE MI REVASCULARIZATION N/A 04/01/2019   Procedure: Coronary/Graft Acute MI Revascularization;  Surgeon: Nelva Bush, MD;  Location: [facility];  Service: Cardiovascular;  Laterality: N/A;   TOTAL HIP ARTHROPLASTY     right   Family History  Problem Relation Age of Onset   Hypertension Mother    Hyperlipidemia Mother    Heart attack Mother    Hypertension Father    Hyperlipidemia Father    Heart attack Father    Prostate cancer Neg Hx    Bladder Cancer Neg Hx    Kidney cancer Neg Hx    Social History   Socioeconomic History   Marital status: Legally Separated    Spouse name: Not on file   Number of children: Not on file   Years of education: Not on file   Highest education level: Not on file  Occupational History   Occupation: retired  Tobacco Use   Smoking status: Former    Packs/day: 1.00    Years: 50.00    Pack years: 50.00    Types: Cigarettes   Smokeless tobacco: Never  Vaping Use   Vaping Use:  Never used  Substance and Sexual Activity   Alcohol use: Not Currently   Drug use: No   Sexual activity: Never  Other Topics Concern   Not on file  Social History Narrative   Not on file   Social Determinants of Health   Financial Resource Strain: Not on file  Food Insecurity: Not on file  Transportation Needs: Not on file  Physical Activity: Not on file  Stress: Not on file  Social Connections: Not on file    Tobacco Counseling Counseling given: Not Answered   Clinical Intake:  Pre-visit preparation completed: No  Pain : 0-10 Pain Score: 10-Worst pain ever Pain Type: Chronic pain Pain Location: Hip Pain Orientation: Left Pain Descriptors / Indicators: Constant Pain Onset: Other (comment) (everyday pain) Pain Frequency: Constant Pain Relieving Factors: pain medication Effect of Pain on Daily Activities: keep on going with everyday life  Pain Relieving Factors: pain medication  BMI - recorded: 34.8 Nutritional Status: BMI > 30  Obese Nutritional Risks: None Diabetes: No  How often do you need to have someone help you when you read instructions, pamphlets, or other written materials from your doctor or pharmacy?: 4 - Often What is the last grade level you completed in school?: college graduate  Diabetic?No     Information entered by :: Donella Pascarella Medina-Vargas DNP   Activities of Daily Living In your present state of health, do you have any difficulty performing the following activities: 01/09/2022 11/24/2021  Hearing? N N  Vision? N N  Difficulty concentrating or making decisions? Y N  Walking or climbing stairs? Y Y  Dressing or bathing? Y Y  Doing errands, shopping? Y N  Preparing Food and eating ? Y -  Using the Toilet? N -  In the past six months, have you accidently leaked urine? Y -  Do you have problems with loss of bowel control? N -  Managing your Medications? Y -  Housekeeping or managing your Housekeeping? Y -  Some recent data might be hidden     Patient Care Team: Pcp, No as  PCP - General [name], MD as PCP - Cardiology (Cardiology) Alisa Graff, FNP as Nurse Practitioner (Family Medicine) [name], MD as Consulting Physician (Cardiology) Erby Pian, MD as Referring Physician (Specialist)  Indicate any recent Medical Services you may have received from other than [facility] providers in the past year (date may be approximate).     Assessment:   This is a routine wellness examination for [facility].  Hearing/Vision screen Hearing Screening - Comments:: No problem hearing.  Dietary issues and exercise activities discussed: Current Exercise Habits: Structured exercise class, Type of exercise: strength training/weights, Time (Minutes): 10, Frequency (Times/Week): 3, Weekly Exercise (Minutes/Week): 30, Intensity: Mild, Exercise limited by: psychological condition(s);orthopedic condition(s)   Goals Addressed               This Visit's Progress     Exercise 3x per week (30 min per time) (pt-stated)        "It's a private business and I won't give you my private information!"       Depression Screen PHQ 2/9 Scores 01/09/2022 06/29/2021 01/06/2021 12/15/2020 11/30/2020 10/25/2020 01/05/2017  PHQ - 2 Score 3 0 0 0 0 0 0  PHQ- 9 Score 6 - - - - - -    Fall Risk Fall Risk  01/09/2022 11/02/2021 08/02/2021 06/29/2021 06/06/2021  Falls in the past year? 1 0 0 0 0  Number falls in past yr: 0 0 - - 0  Injury with Fall? 0 0 - - -  Risk for fall due to : - - - - No Fall Risks  Follow up Falls prevention discussed;Education provided - - - Falls evaluation completed    [place]:  Any stairs in or around the home? No  If so, are there any without handrails? Yes  Home free of loose throw rugs in walkways, pet beds, electrical cords, etc? Yes  Adequate lighting in your home to reduce risk of falls? Yes   ASSISTIVE DEVICES UTILIZED TO PREVENT FALLS:  Life alert? No  Use of a  cane, walker or w/c? Yes  Grab bars in the bathroom? Yes  Shower chair or bench in shower? Yes  Elevated toilet seat or a handicapped toilet? Yes   TIMED UP AND GO:  Was the test performed? No .  Length of time to ambulate 10 feet: N/A sec.   Gait unsteady with use of assistive device, provider informed and education provided.   Cognitive Function: MMSE - Mini Mental State Exam 01/09/2022  Orientation to time 4  Orientation to Place 1  Registration 3  Attention/ Calculation 5  Recall 3  Language- name 2 objects 2  Language- repeat 1  Language- follow 3 step command 3  Language- read & follow direction 1  Write a sentence 1  Copy design 1  Total score 25     6CIT Screen 01/09/2022  What Year? 0 points  What month? 0 points  What time? 3 points  Count back from 20 0 points  Months in reverse 4 points  Repeat phrase 0 points  Total Score 7    Immunizations Immunization History  Administered Date(s) Administered   Influenza,inj,Quad PF,6+ Mos 09/01/2016, 08/29/2017   Influenza-Unspecified 08/18/2021   Tdap 08/20/2017   Unspecified SARS-COV-2 Vaccination 01/19/2020, 02/16/2020, 08/18/2020    TDAP status: Up to date  Flu Vaccine status: Up to date  Pneumococcal vaccine status: Up to date  Covid-19 vaccine status: Information provided on how to  obtain vaccines.   Qualifies for Shingles Vaccine? No   Zostavax completed No   Shingrix Completed?: No.    Education has been provided regarding the importance of this vaccine. Patient has been advised to call insurance company to determine out of pocket expense if they have not yet received this vaccine. Advised may also receive vaccine at local pharmacy or Health Dept. Verbalized acceptance and understanding.  Screening Tests Health Maintenance  Topic Date Due   FOOT EXAM  Never done   OPHTHALMOLOGY EXAM  Never done   URINE MICROALBUMIN  Never done   COVID-19 Vaccine (4 - Booster) 10/13/2020   HEMOGLOBIN A1C   12/30/2021   Zoster Vaccines- Shingrix (1 of 2) 03/22/2022 (Originally 02/12/1954)   Pneumonia Vaccine 36+ Years old (1 - PCV) 12/22/2022 (Originally 02/11/1941)   TETANUS/TDAP  08/21/2027   INFLUENZA VACCINE  Completed   HPV VACCINES  Aged Out    Health Maintenance  Health Maintenance Due  Topic Date Due   FOOT EXAM  Never done   OPHTHALMOLOGY EXAM  Never done   URINE MICROALBUMIN  Never done   COVID-19 Vaccine (4 - Booster) 10/13/2020   HEMOGLOBIN A1C  12/30/2021    Colorectal cancer screening: Type of screening: FOBT/FIT. Completed ordered. Repeat every year years  Lung Cancer Screening: (Low Dose CT Chest recommended if Age 52-80 years, 30 pack-year currently smoking OR have quit w/in 15years.) does not qualify.   Lung Cancer Screening Referral: N/A  Additional Screening:  Hepatitis C Screening: does qualify; Completed ordered  Vision Screening: Recommended annual ophthalmology exams for early detection of glaucoma and other disorders of the eye. Is the patient up to date with their annual eye exam?  No  Who is the provider or what is the name of the office in which the patient attends annual eye exams? None If pt is not established with a provider, would they like to be referred to a provider to establish care? No .   Dental Screening: Recommended annual dental exams for proper oral hygiene  Community Resource Referral / Chronic Care Management: CRR required this visit?  No   CCM required this visit?  No      Plan:     I have personally reviewed and noted the following in the patients chart:   Medical and social history Use of alcohol, tobacco or illicit drugs  Current medications and supplements including opioid prescriptions. Patient is currently taking opioid prescriptions. Information provided to patient regarding non-opioid alternatives. Patient advised to discuss non-opioid treatment plan with their provider. Functional ability and status Nutritional  status Physical activity Advanced directives List of other physicians Hospitalizations, surgeries, and ER visits in previous 12 months Vitals Screenings to include cognitive, depression, and falls Referrals and appointments  In addition, I have reviewed and discussed with patient certain preventive protocols, quality metrics, and best practice recommendations. A written personalized care plan for preventive services as well as general preventive health recommendations were provided to patient.     Durenda Age, NP   01/09/2022   Nurse Notes:  Needs annual AWV

## 2022-01-17 ENCOUNTER — Encounter: Payer: Self-pay | Admitting: Adult Health

## 2022-01-17 ENCOUNTER — Non-Acute Institutional Stay (SKILLED_NURSING_FACILITY): Payer: HMO | Admitting: Adult Health

## 2022-01-17 DIAGNOSIS — N401 Enlarged prostate with lower urinary tract symptoms: Secondary | ICD-10-CM | POA: Diagnosis not present

## 2022-01-17 DIAGNOSIS — I25119 Atherosclerotic heart disease of native coronary artery with unspecified angina pectoris: Secondary | ICD-10-CM

## 2022-01-17 DIAGNOSIS — M25552 Pain in left hip: Secondary | ICD-10-CM

## 2022-01-17 DIAGNOSIS — F319 Bipolar disorder, unspecified: Secondary | ICD-10-CM

## 2022-01-17 DIAGNOSIS — J418 Mixed simple and mucopurulent chronic bronchitis: Secondary | ICD-10-CM

## 2022-01-17 DIAGNOSIS — I10 Essential (primary) hypertension: Secondary | ICD-10-CM | POA: Diagnosis not present

## 2022-01-17 DIAGNOSIS — G8929 Other chronic pain: Secondary | ICD-10-CM

## 2022-01-17 DIAGNOSIS — R338 Other retention of urine: Secondary | ICD-10-CM

## 2022-01-17 DIAGNOSIS — I5022 Chronic systolic (congestive) heart failure: Secondary | ICD-10-CM

## 2022-01-17 DIAGNOSIS — F1097 Alcohol use, unspecified with alcohol-induced persisting dementia: Secondary | ICD-10-CM

## 2022-01-17 DIAGNOSIS — F1027 Alcohol dependence with alcohol-induced persisting dementia: Secondary | ICD-10-CM

## 2022-01-17 NOTE — Progress Notes (Signed)
Location:  [place] Room Number: [place] of Service:  SNF (31) Provider:  Durenda Age, DNP, FNP-BC  Patient Care Team: Pcp, No as PCP - General [name], MD as PCP - Cardiology (Cardiology) Alisa Graff, FNP as Nurse Practitioner (Family Medicine) [name], MD as Consulting Physician (Cardiology) Erby Pian, MD as Referring Physician (Specialist)  Extended Emergency Contact Information Primary Emergency Contact: [name] Mobile Phone: [phone] Relation: Daughter Secondary Emergency Contact: [name] [place] of [place] Phone: [phone] Relation: Daughter  Code Status:   DNR  Goals of care: Advanced Directive information Advanced Directives 01/09/2022  Does Patient Have a Medical Advance Directive? Yes  Type of Paramedic of [place];Out of facility DNR (pink MOST or yellow form)  Does patient want to make changes to medical advance directive? No - Patient declined  Copy of [place] in Chart? Yes - validated most recent copy scanned in chart (See row information)  Would patient like information on creating a medical advance directive? -  Pre-existing out of facility DNR order (yellow form or pink MOST form) Yellow form placed in chart (order not valid for inpatient use)     Chief Complaint  Patient presents with   Discharge Note    For transfer to [facility] on 01/17/22    HPI:  Pt is a 87 y.o. male who is for transfer to [facility] today, 01/17/22 so he can be closer to daughter.  He was admitted to [facility] on 12/05/21 post hospital admission 11/23/2021 to 12/05/2021.  He has a PMH of chronic combined systolic and diastolic dysfunction CHF, stage III chronic kidney disease, coronary artery disease, COPD, bipolar disorder, history of prostate cancer treated with radiation therapy in 1994 and s/p penile  prosthesis in 2010 with revision in 2014.  He presented to the ER with EMS for evaluation of difficulty voiding over 24 hours.  He apparently tried to relieve the urinary obstruction by inserting a metal rod through the urethra.  After that, he started having hematuria and severe suprapubic pain.  Bladder scan was done in the ER which showed greater than 669 mL of urine.  CT renal study showed no acute abnormality.  ER staff attempted to place Foley catheter without success so urology was consulted and was able to place a Foley catheter in the ER with drainage of purulent urine.  Urine and blood culture grew Enterococcus faecium.  ID recommended linezolid x2 weeks.  Hospitalization was complicated by left hip pain.  X-ray showed progressive bony destruction of the left hip joint which could reflect progressive particle disease.  Orthopedics recommended removal of hardware and conversion to total hip arthroplasty.  At [facility], he pulled out his foley catheter with balloon and was sent to ED on 12/10/22. He was able to urinate and so foley catheter was no longer re-inserted. He was sent back to ED on 12/31/21 post fall sustaining no injury. Imaging were negative for acute abnormalities.  He has completed short-term rehabilitation at [facility] and transitioned to long-term care. He will no transfer to [facility] per daughter's request.  Past Medical History:  Diagnosis Date   Alcoholism ([place])    Asthma    Bipolar affective disorder ([place])    Chronic combined systolic and diastolic CHF (congestive heart failure) ([place])    a. 08/2016 Echo: EF 40-45%, mild AS, mild to mod MR,  mildly dil LA/RA, mild-mod TR; b. 07/2017 Echo: EF 40-45%, mod LVH, Gr1 DD, mild to mod AS, mildly dil LA, nl RV fxn; c. 03/2019 Echo: EF 45-50%, AS (not severe). Mod dil PA.   CKD (chronic kidney disease), stage III (HCC)    COPD (chronic obstructive pulmonary disease) (HCC)    Coronary artery disease    a.  01/2004 s/p PCI and Taxus DES to dRCA (3.5 x 12 mm); b. 07/2017 Lexiscan MV: no ischemia. Sm area of apicl thinning, likely attenuation. EF 33% (GI uptake noted)-->Low risk; c. 03/2019 Inf STEMI/PCI: LM nl, LAD min irregs, D1 20ost, RI 20ost, LCX nl, RCA 90p/49m (4.0x26 Resolute Onyx DES), 5m (4.0x15 Resolute Onyx DES), 10d ISR.   Degenerative joint disease    knees and hip   Dyspnea    on exertion   Essential hypertension    Hyperlipidemia    Hypertension    controlled on meds   Ischemic cardiomyopathy    a. 08/2016 Echo: EF 40-45%; b. 07/2017 Echo: EF 40-45%; c. 03/2019 Echo: EF 45-50%.   OSA (obstructive sleep apnea)    Prostate CA (HCC)    prostate ca dx 20 yrs ago   PVD (peripheral vascular disease) ([place])    Tobacco abuse    Past Surgical History:  Procedure Laterality Date   CARDIAC CATHETERIZATION     CORONARY ANGIOGRAPHY N/A 04/01/2019   Procedure: CORONARY ANGIOGRAPHY;  Surgeon: Nelva Bush, MD;  Location: [facility];  Service: Cardiovascular;  Laterality: N/A;   CORONARY ANGIOPLASTY WITH STENT PLACEMENT     x2   CORONARY/GRAFT ACUTE MI REVASCULARIZATION N/A 04/01/2019   Procedure: Coronary/Graft Acute MI Revascularization;  Surgeon: Nelva Bush, MD;  Location: [facility];  Service: Cardiovascular;  Laterality: N/A;   TOTAL HIP ARTHROPLASTY     right    Allergies  Allergen Reactions   Chlorthalidone Other (See Comments)    Hyponatremia   Ace Inhibitors Cough   Hydrochlorothiazide    Lisinopril Other (See Comments)   Losartan    Other Other (See Comments)    "ANY BLOOD PRESSURE MEDICATIONS THAT I'VE TRIED" - PT. DOES NOT REMEMBER WHICH ONES "ANY BLOOD PRESSURE MEDICATIONS THAT I'VE TRIED"- PT. DOES NOT REMEMBER WHICH ONES- CONSTIPATION   Benazepril-Hydrochlorothiazide Other (See Comments)    Constipation    Outpatient Encounter Medications as of 01/17/2022  Medication Sig   ADULT ASPIRIN REGIMEN 81 MG EC tablet TAKE 1 TABLET BY MOUTH DAILY    albuterol (VENTOLIN HFA) 108 (90 Base) MCG/ACT inhaler USE 2 PUFFS EVERY 4 HOURS AS NEEDED   Amino Acids-Protein Hydrolys (PRO-STAT PO) Take 30 mLs by mouth 2 (two) times daily.   atorvastatin (LIPITOR) 40 MG tablet Take 40 mg by mouth daily.   azelastine (ASTELIN) 0.1 % nasal spray Place 1 spray into both nostrils daily as needed for allergies.   bisacodyl (DULCOLAX) 10 MG suppository Place 10 mg rectally as needed for moderate constipation.   calcium carbonate (OSCAL) 1500 (600 Ca) MG TABS tablet Take 1 tablet (1,500 mg total) by mouth 2 (two) times daily with a meal.   carvedilol (COREG) 6.25 MG tablet TAKE 1 TABLET BY MOUTH TWICE A DAY   clopidogrel (PLAVIX) 75 MG tablet Take 1 tablet (75 mg total) by mouth daily.   feeding supplement (ENSURE ENLIVE / ENSURE PLUS) LIQD Take 237 mLs by mouth 3 (three) times daily between meals.   fluticasone (FLONASE) 50 MCG/ACT nasal spray Place 2 sprays into both nostrils daily.  furosemide (LASIX) 40 MG tablet TAKE 1 TABLET BY MOUTH DAILY AND TAKE ANADDITIONAL TABLET IN THE AFTERNOON AS NEEDED FOR WEIGHT GAIN AND LEG EDEMA   ipratropium (ATROVENT) 0.03 % nasal spray Place 2 sprays into both nostrils daily.   isosorbide mononitrate (IMDUR) 30 MG 24 hr tablet Take 30 mg by mouth daily.   lubiprostone (AMITIZA) 8 MCG capsule Take 8 mcg by mouth 2 (two) times daily.   Magnesium Hydroxide (MILK OF MAGNESIA PO) Take 30 mLs by mouth as needed.   Magnesium Oxide 500 MG CAPS Take 1 capsule (500 mg total) by mouth daily.   melatonin 5 MG TABS Take 5 mg by mouth at bedtime.   nitroGLYCERIN (NITROSTAT) 0.4 MG SL tablet Take 0.4 mg by mouth every 5 (five) minutes x 3 doses as needed for chest pain. As needed for chest pain   NON FORMULARY Mech soft diet without theraputic retriction   oxyCODONE (OXY IR/ROXICODONE) 5 MG immediate release tablet Take 1 tablet (5 mg total) by mouth every 8 (eight) hours as needed for severe pain. Must last 30 days.   QUEtiapine  (SEROQUEL) 50 MG tablet Take 50 mg by mouth at bedtime.   Sodium Phosphates (RA SALINE ENEMA RE) Place 1 Dose rectally as needed.   tamsulosin (FLOMAX) 0.4 MG CAPS capsule Take 0.4 mg by mouth daily.   TRELEGY ELLIPTA 200-62.5-25 MCG/INH AEPB Inhale 1 puff into the lungs daily.    [DISCONTINUED] Cholecalciferol (VITAMIN D3) 125 MCG (5000 UT) CAPS Take 1 capsule (5,000 Units total) by mouth daily with breakfast. Take along with calcium and magnesium. (Patient not taking: Reported on 01/27/2021)   No facility-administered encounter medications on file as of 01/17/2022.    Review of Systems  Constitutional:  Negative for activity change, appetite change and fever.  HENT:  Negative for sore throat.   Eyes: Negative.   Cardiovascular:  Negative for chest pain and leg swelling.  Gastrointestinal:  Negative for abdominal distention, diarrhea and vomiting.  Genitourinary:  Negative for dysuria, frequency and urgency.  Skin:  Negative for color change.  Neurological:  Negative for dizziness and headaches.  Psychiatric/Behavioral:  Negative for behavioral problems and sleep disturbance. The patient is not nervous/anxious.       Immunization History  Administered Date(s) Administered   Influenza,inj,Quad PF,6+ Mos 09/01/2016, 08/29/2017   Influenza-Unspecified 08/18/2021   Tdap 08/20/2017   Unspecified SARS-COV-2 Vaccination 01/19/2020, 02/16/2020, 08/18/2020   Pertinent  Health Maintenance Due  Topic Date Due   FOOT EXAM  Never done   OPHTHALMOLOGY EXAM  Never done   URINE MICROALBUMIN  Never done   HEMOGLOBIN A1C  12/30/2021   INFLUENZA VACCINE  Completed   Fall Risk 12/05/2021 12/10/2021 12/11/2021 12/31/2021 01/09/2022  Falls in the past year? - - - - 1  Was there an injury with Fall? - - - - 0  Fall Risk Category Calculator - - - - 1  Fall Risk Category - - - - Low  Patient Fall Risk Level High fall risk High fall risk High fall risk Moderate fall risk Low fall risk  Patient at Risk  for Falls Due to - - - - -  Fall risk Follow up - - - - Falls prevention discussed;Education provided     Vitals:   01/17/22 1000  BP: 113/62  Pulse: 69  Resp: 19  Temp: 97.7 F (36.5 C)  Weight: 212 lb 12.8 oz (96.5 kg)  Height: 5\' 7"  (1.702 m)   Body mass index is  33.33 kg/m.  Physical Exam Constitutional:      Appearance: He is obese.  HENT:     Head: Normocephalic and atraumatic.     Mouth/Throat:     Mouth: Mucous membranes are moist.  Eyes:     Conjunctiva/sclera: Conjunctivae normal.  Cardiovascular:     Rate and Rhythm: Normal rate and regular rhythm.     Pulses: Normal pulses.     Heart sounds: Normal heart sounds.  Pulmonary:     Effort: Pulmonary effort is normal.     Breath sounds: Normal breath sounds.  Abdominal:     General: Bowel sounds are normal.     Palpations: Abdomen is soft.  Musculoskeletal:        General: No swelling.     Cervical back: Normal range of motion.  Skin:    General: Skin is warm and dry.  Neurological:     Mental Status: He is alert and oriented to person, place, and time. Mental status is at baseline.  Psychiatric:     Comments: Argumentative.     Labs reviewed: Recent Labs    02/15/21 0511 02/16/21 0457 06/20/21 0803 11/24/21 0509 11/25/21 0340 12/04/21 0303 12/05/21 0333 12/07/21 0000 12/16/21 0000 12/26/21 0000 12/31/21 2102  NA 134* 135   < > 133*   < > 132* 135   < > 139 134* 137  K 4.8 4.8   < > 4.4   < > 4.4 4.5   < > 3.9 3.8 3.9  CL 103 103   < > 99   < > 105 108   < > 103 100 101  CO2 25 25   < > 22   < > 20* 21*   < > 20 23* 23  GLUCOSE 91 92   < > 82   < > 84 90  --   --   --  99  BUN 46* 48*   < > 24*   < > 21 19   < > 27* 36* 42*  CREATININE 2.18* 2.16*   < > 2.35*   < > 2.20* 2.17*   < > 2.3* 2.2* 2.62*  CALCIUM 8.4* 8.4*   < > 8.2*   < > 8.1* 7.9*   < > 8.2* 8.6* 8.9  MG 2.0 2.0  --  1.5*  --   --   --   --   --   --   --    < > = values in this interval not displayed.   Recent Labs     02/11/21 0946 02/12/21 0537 06/20/21 1025 09/02/21 1418  AST 33 28 18  --   ALT 55* 43 9  --   ALKPHOS 111 97 123  --   BILITOT 1.1 0.7 0.6  --   PROT 6.9 6.1* 8.0  --   ALBUMIN 2.9* 2.5* 3.5 3.2*   Recent Labs    12/04/21 0303 12/05/21 0333 12/07/21 0000 12/16/21 0000 12/31/21 2102  WBC 7.3 5.8 6.4 6.2 6.7  NEUTROABS  --   --  4.30 3.40 4.3  HGB 7.8* 7.8* 8.7* 8.6* 10.1*  HCT 23.8* 24.0* 27* 25* 30.9*  MCV 81.0 82.2  --   --  84.2  PLT 308 319 381 378 248   Lab Results  Component Value Date   TSH 1.388 02/11/2021   Lab Results  Component Value Date   HGBA1C 6.1 (H) 06/29/2021   Lab Results  Component Value Date   CHOL  105 08/26/2021   HDL 38 (L) 08/26/2021   LDLCALC 52 08/26/2021   TRIG 70 08/26/2021   CHOLHDL 2.8 08/26/2021    Significant Diagnostic Results in last 30 days:  CT Head Wo Contrast  Result Date: 12/31/2021 CLINICAL DATA:  Status post fall. EXAM: CT HEAD WITHOUT CONTRAST TECHNIQUE: Contiguous axial images were obtained from the base of the skull through the vertex without intravenous contrast. RADIATION DOSE REDUCTION: This exam was performed according to the departmental dose-optimization program which includes automated exposure control, adjustment of the mA and/or kV according to patient size and/or use of iterative reconstruction technique. COMPARISON:  February 11, 2021 FINDINGS: Brain: There is mild cerebral atrophy with widening of the extra-axial spaces and ventricular dilatation. There are areas of decreased attenuation within the white matter tracts of the supratentorial brain, consistent with microvascular disease changes. A stable, approximately 3.7 cm x 3.7 cm partially calcified meningioma is seen arising from the upper tentorium on the left (axial CT image 16, CT series 3). Vascular: No hyperdense vessel or unexpected calcification. Skull: Normal. Negative for fracture or focal lesion. Sinuses/Orbits: There is marked severity right maxillary  sinus mucosal thickening, mildly increased in severity when compared to the prior study. Other: None. IMPRESSION: 1. No acute intracranial abnormality. 2. Stable, partially calcified meningioma arising from the upper tentorium on the left. 3. Mild cerebral atrophy and microvascular disease changes of the supratentorial brain. 4. Marked severity right maxillary sinus disease. Electronically Signed   By: Virgina Norfolk M.D.   On: 12/31/2021 21:44   CT Cervical Spine Wo Contrast  Result Date: 12/31/2021 CLINICAL DATA:  Status post fall. EXAM: CT CERVICAL SPINE WITHOUT CONTRAST TECHNIQUE: Multidetector CT imaging of the cervical spine was performed without intravenous contrast. Multiplanar CT image reconstructions were also generated. RADIATION DOSE REDUCTION: This exam was performed according to the departmental dose-optimization program which includes automated exposure control, adjustment of the mA and/or kV according to patient size and/or use of iterative reconstruction technique. COMPARISON:  June 12, 2009 FINDINGS: Alignment: Normal. Skull base and vertebrae: No acute fracture. No primary bone lesion or focal pathologic process. Soft tissues and spinal canal: No prevertebral fluid or swelling. No visible canal hematoma. Disc levels: Moderate severity multilevel endplate sclerosis is seen throughout all levels of the cervical spine. Marked severity anterior osteophyte formation is seen at the level of C3-C4 and C7-T1. Mild to moderate severity anterior osteophyte formation is noted at C2-C3 and C4-C5. There is marked severity narrowing of the anterior atlantoaxial articulation with marked severity intervertebral disc space narrowing at the levels of C5-C6 and C6-C7. Moderate severity intervertebral disc space narrowing is seen at C7-T1. Bilateral, marked severity multilevel facet joint hypertrophy is noted. Upper chest: Negative. Other: None. IMPRESSION: 1. No acute fracture or subluxation of the cervical  spine. 2. Moderate to marked severity multilevel degenerative changes, as described above. Electronically Signed   By: Virgina Norfolk M.D.   On: 12/31/2021 21:51   DG Hip Unilat W or Wo Pelvis 2-3 Views Left  Result Date: 12/31/2021 CLINICAL DATA:  Left hip pain EXAM: DG HIP (WITH OR WITHOUT PELVIS) 2-3V LEFT COMPARISON:  11/28/2021 FINDINGS: Chronic deformity of the left femoral head with extensive osseous remodeling of the acetabulum, similar in appearance to the previous study. Prior proximal right femoral ORIF with intact hardware. No evidence of acute perihardware fracture. Alignment of the residual hip joint is unchanged. Right total hip arthroplasty without evidence of complication. Bones appear demineralized. Advanced atherosclerotic calcifications. IMPRESSION: Chronic  deformity of the left hip with extensive osseous remodeling of the acetabulum, similar in appearance to the previous study. No acute osseous findings. Electronically Signed   By: Davina Poke D.O.   On: 12/31/2021 21:50    Assessment/Plan  1. Coronary artery disease involving native coronary artery of native heart with angina pectoris (HCC) -    Continue nitroglycerin 0.4 mg 1 tab sublingual PRN for chest pains 5 minutes x 3, isosorbide mononitrate ER 30 mg 1 tab daily, aspirin EC 81 mg 1 tab daily, clopidogrel 75 mg 1 tab daily, atorvastatin 40 mg 1 tab at bedtime  2. Essential hypertension -   Continue carvedilol 6.25 mg 1 tab twice a day  3. Chronic systolic CHF (congestive heart failure) (HCC)   No SOB, continue furosemide 40 mg 1 tab daily  4. Benign prostatic hyperplasia with urinary retention -   Continue tamsulosin 0.4 mg 1 capsule daily  5. Mixed simple and mucopurulent chronic bronchitis (HCC) -   Continue albuterol HFA 90 mcg inhaler inhale 2 puffs by mouth every 4 hours PRN, trilogy Ellipta 200-62.5-25 mcg puff into the lungs daily  6. Chronic left hip pain -   Continue oxycodone IR 5 mg 1 tab every 8  hours PRN -    Follows up with orthopedics  7. Bipolar disorder with psychotic features ([place]) -Argumentative, continue quetiapine 50 mg 1 tab at bedtime  8. Dementia associated with alcoholism, with psychotic disturbance, unspecified dementia severity ([place]) -    Continue supportive care     I have filled out patient's discharge paperwork.  DME provided:   None  Total discharge time: Greater than 30 minutes Greater than 50% was spent in counseling and coordination of care.   Discharge time involved coordination of the discharge process with social worker, nursing staff and therapy department.    Durenda Age, DNP, MSN, FNP-BC [facility] [phone] (Monday-Friday 8:00 a.m. - 5:00 p.m.) [phone] (after hours)

## 2022-01-24 ENCOUNTER — Emergency Department: Payer: HMO

## 2022-01-24 ENCOUNTER — Inpatient Hospital Stay
Admission: EM | Admit: 2022-01-24 | Discharge: 2022-02-15 | DRG: 922 | Disposition: E | Payer: HMO | Attending: Pulmonary Disease | Admitting: Pulmonary Disease

## 2022-01-24 ENCOUNTER — Emergency Department (HOSPITAL_COMMUNITY): Payer: Medicaid Other

## 2022-01-24 DIAGNOSIS — N179 Acute kidney failure, unspecified: Secondary | ICD-10-CM | POA: Diagnosis present

## 2022-01-24 DIAGNOSIS — T71162A Asphyxiation due to hanging, intentional self-harm, initial encounter: Secondary | ICD-10-CM | POA: Diagnosis present

## 2022-01-24 DIAGNOSIS — Z888 Allergy status to other drugs, medicaments and biological substances status: Secondary | ICD-10-CM

## 2022-01-24 DIAGNOSIS — I469 Cardiac arrest, cause unspecified: Secondary | ICD-10-CM | POA: Diagnosis present

## 2022-01-24 DIAGNOSIS — Z9114 Patient's other noncompliance with medication regimen: Secondary | ICD-10-CM | POA: Insufficient documentation

## 2022-01-24 DIAGNOSIS — F319 Bipolar disorder, unspecified: Secondary | ICD-10-CM | POA: Diagnosis present

## 2022-01-24 DIAGNOSIS — I2510 Atherosclerotic heart disease of native coronary artery without angina pectoris: Secondary | ICD-10-CM | POA: Diagnosis present

## 2022-01-24 DIAGNOSIS — Z87891 Personal history of nicotine dependence: Secondary | ICD-10-CM

## 2022-01-24 DIAGNOSIS — R570 Cardiogenic shock: Secondary | ICD-10-CM | POA: Diagnosis present

## 2022-01-24 DIAGNOSIS — Z955 Presence of coronary angioplasty implant and graft: Secondary | ICD-10-CM

## 2022-01-24 DIAGNOSIS — J9602 Acute respiratory failure with hypercapnia: Secondary | ICD-10-CM | POA: Diagnosis present

## 2022-01-24 DIAGNOSIS — G4733 Obstructive sleep apnea (adult) (pediatric): Secondary | ICD-10-CM | POA: Diagnosis present

## 2022-01-24 DIAGNOSIS — Z515 Encounter for palliative care: Secondary | ICD-10-CM

## 2022-01-24 DIAGNOSIS — E871 Hypo-osmolality and hyponatremia: Secondary | ICD-10-CM | POA: Diagnosis present

## 2022-01-24 DIAGNOSIS — M170 Bilateral primary osteoarthritis of knee: Secondary | ICD-10-CM | POA: Diagnosis present

## 2022-01-24 DIAGNOSIS — G931 Anoxic brain damage, not elsewhere classified: Secondary | ICD-10-CM | POA: Diagnosis present

## 2022-01-24 DIAGNOSIS — Z8249 Family history of ischemic heart disease and other diseases of the circulatory system: Secondary | ICD-10-CM

## 2022-01-24 DIAGNOSIS — N1832 Chronic kidney disease, stage 3b: Secondary | ICD-10-CM | POA: Diagnosis present

## 2022-01-24 DIAGNOSIS — J320 Chronic maxillary sinusitis: Secondary | ICD-10-CM | POA: Diagnosis present

## 2022-01-24 DIAGNOSIS — Z79899 Other long term (current) drug therapy: Secondary | ICD-10-CM

## 2022-01-24 DIAGNOSIS — I130 Hypertensive heart and chronic kidney disease with heart failure and stage 1 through stage 4 chronic kidney disease, or unspecified chronic kidney disease: Secondary | ICD-10-CM | POA: Diagnosis present

## 2022-01-24 DIAGNOSIS — R339 Retention of urine, unspecified: Secondary | ICD-10-CM | POA: Diagnosis present

## 2022-01-24 DIAGNOSIS — T71194A Asphyxiation due to mechanical threat to breathing due to other causes, undetermined, initial encounter: Secondary | ICD-10-CM

## 2022-01-24 DIAGNOSIS — Z923 Personal history of irradiation: Secondary | ICD-10-CM

## 2022-01-24 DIAGNOSIS — Z91148 Patient's other noncompliance with medication regimen for other reason: Secondary | ICD-10-CM | POA: Insufficient documentation

## 2022-01-24 DIAGNOSIS — I468 Cardiac arrest due to other underlying condition: Secondary | ICD-10-CM | POA: Diagnosis present

## 2022-01-24 DIAGNOSIS — I255 Ischemic cardiomyopathy: Secondary | ICD-10-CM | POA: Diagnosis present

## 2022-01-24 DIAGNOSIS — J690 Pneumonitis due to inhalation of food and vomit: Secondary | ICD-10-CM | POA: Diagnosis present

## 2022-01-24 DIAGNOSIS — Z7902 Long term (current) use of antithrombotics/antiplatelets: Secondary | ICD-10-CM

## 2022-01-24 DIAGNOSIS — I5042 Chronic combined systolic (congestive) and diastolic (congestive) heart failure: Secondary | ICD-10-CM | POA: Diagnosis present

## 2022-01-24 DIAGNOSIS — F419 Anxiety disorder, unspecified: Secondary | ICD-10-CM | POA: Diagnosis present

## 2022-01-24 DIAGNOSIS — Z7951 Long term (current) use of inhaled steroids: Secondary | ICD-10-CM

## 2022-01-24 DIAGNOSIS — I252 Old myocardial infarction: Secondary | ICD-10-CM

## 2022-01-24 DIAGNOSIS — Z96641 Presence of right artificial hip joint: Secondary | ICD-10-CM

## 2022-01-24 DIAGNOSIS — R680 Hypothermia, not associated with low environmental temperature: Secondary | ICD-10-CM | POA: Diagnosis present

## 2022-01-24 DIAGNOSIS — Z20822 Contact with and (suspected) exposure to covid-19: Secondary | ICD-10-CM | POA: Diagnosis present

## 2022-01-24 DIAGNOSIS — Z7982 Long term (current) use of aspirin: Secondary | ICD-10-CM

## 2022-01-24 DIAGNOSIS — G40901 Epilepsy, unspecified, not intractable, with status epilepticus: Secondary | ICD-10-CM | POA: Diagnosis present

## 2022-01-24 DIAGNOSIS — D320 Benign neoplasm of cerebral meninges: Secondary | ICD-10-CM | POA: Diagnosis present

## 2022-01-24 DIAGNOSIS — E872 Acidosis, unspecified: Secondary | ICD-10-CM | POA: Diagnosis present

## 2022-01-24 DIAGNOSIS — J9601 Acute respiratory failure with hypoxia: Secondary | ICD-10-CM | POA: Diagnosis present

## 2022-01-24 DIAGNOSIS — Z66 Do not resuscitate: Secondary | ICD-10-CM | POA: Diagnosis not present

## 2022-01-24 DIAGNOSIS — F1020 Alcohol dependence, uncomplicated: Secondary | ICD-10-CM | POA: Diagnosis present

## 2022-01-24 DIAGNOSIS — Z8546 Personal history of malignant neoplasm of prostate: Secondary | ICD-10-CM

## 2022-01-24 DIAGNOSIS — J449 Chronic obstructive pulmonary disease, unspecified: Secondary | ICD-10-CM | POA: Diagnosis present

## 2022-01-24 DIAGNOSIS — I739 Peripheral vascular disease, unspecified: Secondary | ICD-10-CM | POA: Diagnosis present

## 2022-01-24 DIAGNOSIS — R292 Abnormal reflex: Secondary | ICD-10-CM | POA: Diagnosis present

## 2022-01-24 DIAGNOSIS — Z8349 Family history of other endocrine, nutritional and metabolic diseases: Secondary | ICD-10-CM

## 2022-01-24 DIAGNOSIS — E785 Hyperlipidemia, unspecified: Secondary | ICD-10-CM | POA: Diagnosis present

## 2022-01-24 LAB — BLOOD GAS, VENOUS
Acid-base deficit: 8.7 mmol/L — ABNORMAL HIGH (ref 0.0–2.0)
Bicarbonate: 18 mmol/L — ABNORMAL LOW (ref 20.0–28.0)
FIO2: 0.6
MECHVT: 500 mL
O2 Saturation: 75.8 %
PEEP: 5 cmH2O
Patient temperature: 37
RATE: 20 resp/min
pCO2, Ven: 41 mmHg — ABNORMAL LOW (ref 44.0–60.0)
pH, Ven: 7.25 (ref 7.250–7.430)
pO2, Ven: 48 mmHg — ABNORMAL HIGH (ref 32.0–45.0)

## 2022-01-24 LAB — COMPREHENSIVE METABOLIC PANEL
ALT: 12 U/L (ref 0–44)
AST: 27 U/L (ref 15–41)
Albumin: 2.9 g/dL — ABNORMAL LOW (ref 3.5–5.0)
Alkaline Phosphatase: 127 U/L — ABNORMAL HIGH (ref 38–126)
Anion gap: 15 (ref 5–15)
BUN: 52 mg/dL — ABNORMAL HIGH (ref 8–23)
CO2: 17 mmol/L — ABNORMAL LOW (ref 22–32)
Calcium: 8.4 mg/dL — ABNORMAL LOW (ref 8.9–10.3)
Chloride: 98 mmol/L (ref 98–111)
Creatinine, Ser: 3.51 mg/dL — ABNORMAL HIGH (ref 0.61–1.24)
GFR, Estimated: 16 mL/min — ABNORMAL LOW (ref >60)
Glucose, Bld: 131 mg/dL — ABNORMAL HIGH (ref 70–99)
Potassium: 3.9 mmol/L (ref 3.5–5.1)
Sodium: 130 mmol/L — ABNORMAL LOW (ref 135–145)
Total Bilirubin: 0.9 mg/dL (ref 0.3–1.2)
Total Protein: 7.3 g/dL (ref 6.5–8.1)

## 2022-01-24 LAB — TROPONIN I (HIGH SENSITIVITY): Troponin I (High Sensitivity): 57 ng/L — ABNORMAL HIGH (ref <18)

## 2022-01-24 LAB — CBC WITH DIFFERENTIAL/PLATELET
Abs Immature Granulocytes: 0.19 10*3/uL — ABNORMAL HIGH (ref 0.00–0.07)
Basophils Absolute: 0 10*3/uL (ref 0.0–0.1)
Basophils Relative: 0 %
Eosinophils Absolute: 0.1 10*3/uL (ref 0.0–0.5)
Eosinophils Relative: 1 %
HCT: 30.4 % — ABNORMAL LOW (ref 39.0–52.0)
Hemoglobin: 9.7 g/dL — ABNORMAL LOW (ref 13.0–17.0)
Immature Granulocytes: 2 %
Lymphocytes Relative: 35 %
Lymphs Abs: 3.3 10*3/uL (ref 0.7–4.0)
MCH: 27.8 pg (ref 26.0–34.0)
MCHC: 31.9 g/dL (ref 30.0–36.0)
MCV: 87.1 fL (ref 80.0–100.0)
Monocytes Absolute: 0.9 10*3/uL (ref 0.1–1.0)
Monocytes Relative: 9 %
Neutro Abs: 4.9 10*3/uL (ref 1.7–7.7)
Neutrophils Relative %: 53 %
Platelets: 351 10*3/uL (ref 150–400)
RBC: 3.49 MIL/uL — ABNORMAL LOW (ref 4.22–5.81)
RDW: 15.4 % (ref 11.5–15.5)
WBC: 9.4 10*3/uL (ref 4.0–10.5)
nRBC: 0 % (ref 0.0–0.2)

## 2022-01-24 LAB — GLUCOSE, CAPILLARY: Glucose-Capillary: 102 mg/dL — ABNORMAL HIGH (ref 70–99)

## 2022-01-24 LAB — RESP PANEL BY RT-PCR (FLU A&B, COVID) ARPGX2
Influenza A by PCR: NEGATIVE
Influenza B by PCR: NEGATIVE
SARS Coronavirus 2 by RT PCR: NEGATIVE

## 2022-01-24 LAB — LACTIC ACID, PLASMA
Lactic Acid, Venous: 6.2 mmol/L (ref 0.5–1.9)
Lactic Acid, Venous: 2.9 mmol/L (ref 0.5–1.9)

## 2022-01-24 LAB — ETHANOL: Alcohol, Ethyl (B): <10 mg/dL (ref <10)

## 2022-01-24 LAB — CBG MONITORING, ED: Glucose-Capillary: 113 mg/dL — ABNORMAL HIGH (ref 70–99)

## 2022-01-24 MED ORDER — PROPOFOL 1000 MG/100ML IV EMUL
0.0000 ug/kg/min | INTRAVENOUS | Status: DC
  Administered 2022-01-24: 5 ug/kg/min via INTRAVENOUS
  Filled 2022-01-24: qty 100

## 2022-01-24 MED ORDER — SODIUM CHLORIDE 0.9 % IV BOLUS
1000.0000 mL | Freq: Once | INTRAVENOUS | Status: AC
  Administered 2022-01-24: 1000 mL via INTRAVENOUS

## 2022-01-24 MED ORDER — HEPARIN SODIUM (PORCINE) 5000 UNIT/ML IJ SOLN
5000.0000 [IU] | Freq: Three times a day (TID) | INTRAMUSCULAR | Status: DC
  Administered 2022-01-25 (×2): 5000 [IU] via SUBCUTANEOUS
  Filled 2022-01-24 (×2): qty 1

## 2022-01-24 MED ORDER — ROCURONIUM BROMIDE 10 MG/ML (PF) SYRINGE
PREFILLED_SYRINGE | INTRAVENOUS | Status: AC
  Filled 2022-01-24: qty 10

## 2022-01-24 MED ORDER — FENTANYL CITRATE PF 50 MCG/ML IJ SOSY
50.0000 ug | PREFILLED_SYRINGE | INTRAMUSCULAR | Status: DC | PRN
  Filled 2022-01-24: qty 1

## 2022-01-24 MED ORDER — PROPOFOL 1000 MG/100ML IV EMUL
0.0000 ug/kg/min | INTRAVENOUS | Status: DC
  Administered 2022-01-25: 5 ug/kg/min via INTRAVENOUS
  Administered 2022-01-25: 40 ug/kg/min via INTRAVENOUS
  Filled 2022-01-24 (×2): qty 100

## 2022-01-24 MED ORDER — POLYETHYLENE GLYCOL 3350 17 G PO PACK: 17.0000 g | PACK | Freq: Every day | ORAL | Status: DC

## 2022-01-24 MED ORDER — BUDESONIDE 0.25 MG/2ML IN SUSP
0.2500 mg | Freq: Two times a day (BID) | RESPIRATORY_TRACT | Status: DC
  Administered 2022-01-25: 0.25 mg via RESPIRATORY_TRACT
  Filled 2022-01-24: qty 2

## 2022-01-24 MED ORDER — ALBUTEROL SULFATE (2.5 MG/3ML) 0.083% IN NEBU
2.5000 mg | INHALATION_SOLUTION | Freq: Four times a day (QID) | RESPIRATORY_TRACT | Status: DC
  Administered 2022-01-25 (×2): 2.5 mg via RESPIRATORY_TRACT
  Filled 2022-01-24 (×2): qty 3

## 2022-01-24 MED ORDER — DOCUSATE SODIUM 100 MG PO CAPS: 100.0000 mg | ORAL_CAPSULE | Freq: Two times a day (BID) | ORAL | Status: DC | PRN

## 2022-01-24 MED ORDER — ETOMIDATE 2 MG/ML IV SOLN
INTRAVENOUS | Status: AC
  Filled 2022-01-24: qty 10

## 2022-01-24 MED ORDER — FENTANYL CITRATE PF 50 MCG/ML IJ SOSY: 50.0000 ug | PREFILLED_SYRINGE | INTRAMUSCULAR | Status: DC | PRN

## 2022-01-24 MED ORDER — FENTANYL 2500MCG IN NS 250ML (10MCG/ML) PREMIX INFUSION
25.0000 ug/h | INTRAVENOUS | Status: DC
  Administered 2022-01-25: 25 ug/h via INTRAVENOUS
  Filled 2022-01-24: qty 250

## 2022-01-24 MED ORDER — DOCUSATE SODIUM 50 MG/5ML PO LIQD
100.0000 mg | Freq: Two times a day (BID) | ORAL | Status: DC
  Filled 2022-01-24 (×2): qty 10

## 2022-01-24 MED ORDER — POLYETHYLENE GLYCOL 3350 17 G PO PACK: 17.0000 g | PACK | Freq: Every day | ORAL | Status: DC | PRN

## 2022-01-24 MED ORDER — FENTANYL BOLUS VIA INFUSION
25.0000 ug | INTRAVENOUS | Status: DC | PRN
  Filled 2022-01-24: qty 100

## 2022-01-24 MED ORDER — IPRATROPIUM-ALBUTEROL 0.5-2.5 (3) MG/3ML IN SOLN: 3.0000 mL | Freq: Four times a day (QID) | RESPIRATORY_TRACT | Status: DC | PRN

## 2022-01-24 MED ORDER — PANTOPRAZOLE SODIUM 40 MG IV SOLR
40.0000 mg | Freq: Every day | INTRAVENOUS | Status: DC
  Administered 2022-01-25: 40 mg via INTRAVENOUS
  Filled 2022-01-24: qty 10

## 2022-01-24 NOTE — Progress Notes (Signed)
Pt arrived to ICU rm 10 at approx 2330, RT at bedside to secure ETT, VSS, pt hypothermic bair hugger applied, pt cataract Left eye.

## 2022-01-24 NOTE — ED Notes (Signed)
OG attempted x 3 nurses and foley attempted x 2 nurses. Per provider, pt needed urology consult for prior foley placement related to penile prosthesis. Provider aware.

## 2022-01-24 NOTE — ED Provider Notes (Signed)
[facility] Provider Note    Event Date/Time   First MD Initiated Contact with Patient 01/23/2022 2100     (approximate)   History   Cardiac Arrest (Pt from [facility]. Found after attempted hanging. Unknown downtime. Pt had 2 rounds of CPR and 2 of epi, achieved ROSC. King airway in place on arrival. )   HPI  [name] is a 87 y.o. male here with strangulation and brief cardiac arrest.  Patient brought in emergency traffic via EMS.  He lives at a nursing facility.  He reportedly was found with a television cord around his neck, unresponsive.  He had no pulse.  CPR was begun.  It is unknown how long he was apneic or pulseless.  Following several rounds of CPR, he had return of spontaneous circulation and has remained in sinus tachycardia since then.  He has been breathing but no purposeful movement.  Remainder of history limited as patient arrives with assisted ventilations, unresponsive.   Per review of records, patient has a fairly extensive medical history including CKD, CHF, CAD, bipolar disorder, and has a history of prior suicide attempt.  Physical Exam   Triage Vital Signs: ED Triage Vitals  Enc Vitals Group     BP 02/12/2022 2048 (!) 83/48     Pulse Rate 02/08/2022 2050 75     Resp 02/09/2022 2050 10     Temp --      Temp src --      SpO2 02/01/2022 2050 100 %     Weight --      Height --      Head Circumference --      Peak Flow --      Pain Score --      Pain Loc --      Pain Edu? --      Excl. in [employer]? --     Most recent vital signs: Vitals:   02/06/2022 2230 01/28/2022 2242  BP: 116/74   Pulse: 74   Resp: (!) 21   Temp:  (!) 94 F (34.4 C)  SpO2: 100%      General: Unresponsive, King airway in place. CV:  Good peripheral perfusion.  Heart tachycardic but regular.  2+ radial pulses bilaterally. Resp:  BVM assisted respirations.  Transmitted upper airway sounds related to this.  Aeration appears symmetric. Abd:  No distention.   Other:  Spontaneous respirations noted.  Does not localize.  GCS 3 T.   ED Results / Procedures / Treatments   Labs (all labs ordered are listed, but only abnormal results are displayed) Labs Reviewed  CBC WITH DIFFERENTIAL/PLATELET - Abnormal; Notable for the following components:      Result Value   RBC 3.49 (*)    Hemoglobin 9.7 (*)    HCT 30.4 (*)    Abs Immature Granulocytes 0.19 (*)    All other components within normal limits  COMPREHENSIVE METABOLIC PANEL - Abnormal; Notable for the following components:   Sodium 130 (*)    CO2 17 (*)    Glucose, Bld 131 (*)    BUN 52 (*)    Creatinine, Ser 3.51 (*)    Calcium 8.4 (*)    Albumin 2.9 (*)    Alkaline Phosphatase 127 (*)    GFR, Estimated 16 (*)    All other components within normal limits  BLOOD GAS, VENOUS - Abnormal; Notable for the following components:   pCO2, Ven 41 (*)  pO2, Ven 48.0 (*)    Bicarbonate 18.0 (*)    Acid-base deficit 8.7 (*)    All other components within normal limits  LACTIC ACID, PLASMA - Abnormal; Notable for the following components:   Lactic Acid, Venous 6.2 (*)    All other components within normal limits  CBG MONITORING, ED - Abnormal; Notable for the following components:   Glucose-Capillary 113 (*)    All other components within normal limits  TROPONIN I (HIGH SENSITIVITY) - Abnormal; Notable for the following components:   Troponin I (High Sensitivity) 57 (*)    All other components within normal limits  RESP PANEL BY RT-PCR (FLU A&B, COVID) ARPGX2  ETHANOL  LACTIC ACID, PLASMA  URINE DRUG SCREEN, QUALITATIVE ([facility] ONLY)  TRIGLYCERIDES  CREATININE, SERUM  CBC  BASIC METABOLIC PANEL  MAGNESIUM  PHOSPHORUS  TROPONIN I (HIGH SENSITIVITY)     EKG Normal sinus rhythm, ventricular rate 68.  PR 195, QRS 90, QTc 502.  No acute ST elevations or depressions.  No EKG evidence of acute ischemia or infarct.   RADIOLOGY CT head: No acute intracranial abnormality Chest x-ray: ET  tube in place   I also independently reviewed and agree wit radiologist interpretations.   PROCEDURES:  Critical Care performed: Yes, see critical care procedure note(s)  .Critical Care Performed by: Duffy Bruce, MD Authorized by: Duffy Bruce, MD   Critical care provider statement:    Critical care time (minutes):  30   Critical care time was exclusive of:  Separately billable procedures and treating other patients   Critical care was necessary to treat or prevent imminent or life-threatening deterioration of the following conditions:  Circulatory failure, respiratory failure and cardiac failure   Critical care was time spent personally by me on the following activities:  Development of treatment plan with patient or surrogate, discussions with consultants, evaluation of patient's response to treatment, examination of patient, ordering and review of laboratory studies, ordering and review of radiographic studies, ordering and performing treatments and interventions, pulse oximetry, re-evaluation of patient's condition and review of old charts   I assumed direction of critical care for this patient from another provider in my specialty: no     Care discussed with: admitting provider   Procedure Name: Intubation Date/Time: 01/18/2022 11:11 PM Performed by: Duffy Bruce, MD Pre-anesthesia Checklist: Patient identified Oxygen Delivery Method: Ambu bag Preoxygenation: Pre-oxygenation with 100% oxygen Ventilation: Mask ventilation without difficulty Laryngoscope Size: Glidescope and 4 Grade View: Grade I Tube size: 7.5 mm Number of attempts: 1 Airway Equipment and Method: Video-laryngoscopy and Rigid stylet Placement Confirmation: ETT inserted through vocal cords under direct vision Secured at: 24 cm Tube secured with: ETT holder Dental Injury: Teeth and Oropharynx as per pre-operative assessment  Difficulty Due To: Difficulty was unanticipated Future Recommendations:  Recommend- induction with short-acting agent, and alternative techniques readily available       MEDICATIONS ORDERED IN ED: Medications  fentaNYL (SUBLIMAZE) injection 50 mcg (has no administration in time range)  fentaNYL (SUBLIMAZE) injection 50 mcg (has no administration in time range)  docusate sodium (COLACE) capsule 100 mg (has no administration in time range)  polyethylene glycol (MIRALAX / GLYCOLAX) packet 17 g (has no administration in time range)  heparin injection 5,000 Units (has no administration in time range)  pantoprazole (PROTONIX) injection 40 mg (has no administration in time range)  ipratropium-albuterol (DUONEB) 0.5-2.5 (3) MG/3ML nebulizer solution 3 mL (has no administration in time range)  albuterol (PROVENTIL) (2.5 MG/3ML) 0.083% nebulizer solution  2.5 mg (has no administration in time range)  budesonide (PULMICORT) nebulizer solution 0.25 mg (has no administration in time range)  docusate (COLACE) 50 MG/5ML liquid 100 mg (has no administration in time range)  polyethylene glycol (MIRALAX / GLYCOLAX) packet 17 g (has no administration in time range)  fentaNYL 259mcg in NS 276mL (60mcg/ml) infusion-PREMIX (has no administration in time range)  fentaNYL (SUBLIMAZE) bolus via infusion 25-100 mcg (has no administration in time range)  propofol (DIPRIVAN) 1000 MG/100ML infusion (has no administration in time range)  rocuronium bromide 100 MG/10ML SOSY (  Given by Other 01/20/2022 2054)  etomidate (AMIDATE) 2 MG/ML injection (  Given by Other 02/03/2022 2053)  sodium chloride 0.9 % bolus 1,000 mL (0 mLs Intravenous Stopped 01/27/2022 2214)     IMPRESSION / MDM / ASSESSMENT AND PLAN / ED COURSE  I reviewed the triage vital signs and the nursing notes.                               The patient is on the cardiac monitor to evaluate for evidence of arrhythmia and/or significant heart rate changes.   Ddx:     MDM:  87 year old male with extensive past medical history  including CHF, CAD, bipolar disorder, CKD, here with cardiac arrest after intentional strangulation.  Patient had return of spontaneous circulation prior to ED arrival.  On ED arrival, patient had 2+ radial pulses.  Mild hypotension noted which improved with IV fluids.  King airway was replaced with a 7.5 ET tube by myself, no apparent injury to the airway was identified on intubation.  Patient noted to spontaneously breathe, but otherwise no purposeful movement identified.  Lab work reviewed, CBC shows no significant leukocytosis.  CMP with moderate acute on chronic kidney injury.  Lactic acid 6.2 which fits with his cardiac arrest.  COVID-negative.  Troponin 57, likely related to his arrest and CPR, EKG shows no ischemia.  Blood gas consistent with likely metabolic acidosis.  CT of the head negative, no acute abnormality noted.  Will plan to admit to the ICU.  This case was discussed with the patient's daughter, via telephone, who expresses understanding.  I discussed that while patient is DNR, he was resuscitated as this was an attempt to kill himself.  She expresses understanding.  I reviewed his previous records including prior admission in December 2022 for urinary retention and UTIs, as well as prior admissions for chest pain.   MEDICATIONS GIVEN IN ED: Medications  fentaNYL (SUBLIMAZE) injection 50 mcg (has no administration in time range)  fentaNYL (SUBLIMAZE) injection 50 mcg (has no administration in time range)  docusate sodium (COLACE) capsule 100 mg (has no administration in time range)  polyethylene glycol (MIRALAX / GLYCOLAX) packet 17 g (has no administration in time range)  heparin injection 5,000 Units (has no administration in time range)  pantoprazole (PROTONIX) injection 40 mg (has no administration in time range)  ipratropium-albuterol (DUONEB) 0.5-2.5 (3) MG/3ML nebulizer solution 3 mL (has no administration in time range)  albuterol (PROVENTIL) (2.5 MG/3ML) 0.083% nebulizer  solution 2.5 mg (has no administration in time range)  budesonide (PULMICORT) nebulizer solution 0.25 mg (has no administration in time range)  docusate (COLACE) 50 MG/5ML liquid 100 mg (has no administration in time range)  polyethylene glycol (MIRALAX / GLYCOLAX) packet 17 g (has no administration in time range)  fentaNYL 258mcg in NS 219mL (57mcg/ml) infusion-PREMIX (has no administration in time range)  fentaNYL (SUBLIMAZE) bolus via infusion 25-100 mcg (has no administration in time range)  propofol (DIPRIVAN) 1000 MG/100ML infusion (has no administration in time range)  rocuronium bromide 100 MG/10ML SOSY (  Given by Other 01/23/2022 2054)  etomidate (AMIDATE) 2 MG/ML injection (  Given by Other 01/29/2022 2053)  sodium chloride 0.9 % bolus 1,000 mL (0 mLs Intravenous Stopped 01/23/2022 2214)     Consults:  ICU, via telephone and at bedside   EMR reviewed  PCP notes and nursing visits from January 2023  Admission notes from December 2022     FINAL CLINICAL IMPRESSION(S) / ED DIAGNOSES   Final diagnoses:  Strangulation or suffocation, initial encounter  Cardiac arrest [facility])     Rx / DC Orders   ED Discharge Orders     None        Note:  This document was prepared using Dragon voice recognition software and may include unintentional dictation errors.   Duffy Bruce, MD 01/22/2022 2318

## 2022-01-25 ENCOUNTER — Encounter: Payer: Self-pay | Admitting: Pulmonary Disease

## 2022-01-25 ENCOUNTER — Encounter: Payer: HMO | Admitting: Pain Medicine

## 2022-01-25 DIAGNOSIS — G931 Anoxic brain damage, not elsewhere classified: Secondary | ICD-10-CM

## 2022-01-25 DIAGNOSIS — I469 Cardiac arrest, cause unspecified: Secondary | ICD-10-CM | POA: Diagnosis not present

## 2022-01-25 DIAGNOSIS — Z9114 Patient's other noncompliance with medication regimen: Secondary | ICD-10-CM

## 2022-01-25 DIAGNOSIS — Z7189 Other specified counseling: Secondary | ICD-10-CM | POA: Diagnosis not present

## 2022-01-25 DIAGNOSIS — Z515 Encounter for palliative care: Secondary | ICD-10-CM | POA: Diagnosis not present

## 2022-01-25 DIAGNOSIS — T71194A Asphyxiation due to mechanical threat to breathing due to other causes, undetermined, initial encounter: Secondary | ICD-10-CM | POA: Diagnosis not present

## 2022-01-25 DIAGNOSIS — Z79899 Other long term (current) drug therapy: Secondary | ICD-10-CM

## 2022-01-25 DIAGNOSIS — Z79891 Long term (current) use of opiate analgesic: Secondary | ICD-10-CM

## 2022-01-25 DIAGNOSIS — G8929 Other chronic pain: Secondary | ICD-10-CM

## 2022-01-25 DIAGNOSIS — G894 Chronic pain syndrome: Secondary | ICD-10-CM

## 2022-01-25 LAB — GLUCOSE, CAPILLARY: Glucose-Capillary: 123 mg/dL — ABNORMAL HIGH (ref 70–99)

## 2022-01-25 LAB — BASIC METABOLIC PANEL
Anion gap: 14 (ref 5–15)
BUN: 56 mg/dL — ABNORMAL HIGH (ref 8–23)
CO2: 17 mmol/L — ABNORMAL LOW (ref 22–32)
Calcium: 7.9 mg/dL — ABNORMAL LOW (ref 8.9–10.3)
Chloride: 102 mmol/L (ref 98–111)
Creatinine, Ser: 3.48 mg/dL — ABNORMAL HIGH (ref 0.61–1.24)
GFR, Estimated: 17 mL/min — ABNORMAL LOW (ref >60)
Glucose, Bld: 109 mg/dL — ABNORMAL HIGH (ref 70–99)
Potassium: 3.7 mmol/L (ref 3.5–5.1)
Sodium: 133 mmol/L — ABNORMAL LOW (ref 135–145)

## 2022-01-25 LAB — CBC
HCT: 25.7 % — ABNORMAL LOW (ref 39.0–52.0)
Hemoglobin: 8.5 g/dL — ABNORMAL LOW (ref 13.0–17.0)
MCH: 27.9 pg (ref 26.0–34.0)
MCHC: 33.1 g/dL (ref 30.0–36.0)
MCV: 84.3 fL (ref 80.0–100.0)
Platelets: 267 10*3/uL (ref 150–400)
RBC: 3.05 MIL/uL — ABNORMAL LOW (ref 4.22–5.81)
RDW: 15.4 % (ref 11.5–15.5)
WBC: 10.6 10*3/uL — ABNORMAL HIGH (ref 4.0–10.5)
nRBC: 0 % (ref 0.0–0.2)

## 2022-01-25 LAB — CREATININE, SERUM
Creatinine, Ser: 3.37 mg/dL — ABNORMAL HIGH (ref 0.61–1.24)
GFR, Estimated: 17 mL/min — ABNORMAL LOW (ref >60)

## 2022-01-25 LAB — MAGNESIUM: Magnesium: 2.6 mg/dL — ABNORMAL HIGH (ref 1.7–2.4)

## 2022-01-25 LAB — TRIGLYCERIDES: Triglycerides: 88 mg/dL (ref <150)

## 2022-01-25 LAB — PHOSPHORUS: Phosphorus: 4.5 mg/dL (ref 2.5–4.6)

## 2022-01-25 LAB — TROPONIN I (HIGH SENSITIVITY): Troponin I (High Sensitivity): 88 ng/L — ABNORMAL HIGH (ref <18)

## 2022-01-25 MED ORDER — MIDAZOLAM HCL 2 MG/2ML IJ SOLN
2.0000 mg | INTRAMUSCULAR | Status: DC | PRN
  Administered 2022-01-25 (×3): 2 mg via INTRAVENOUS
  Filled 2022-01-25 (×2): qty 2

## 2022-01-25 MED ORDER — HALOPERIDOL LACTATE 5 MG/ML IJ SOLN: 0.5000 mg | INTRAMUSCULAR | Status: DC | PRN

## 2022-01-25 MED ORDER — GLYCOPYRROLATE 1 MG PO TABS
1.0000 mg | ORAL_TABLET | ORAL | Status: DC | PRN
  Filled 2022-01-25: qty 1

## 2022-01-25 MED ORDER — HALOPERIDOL LACTATE 2 MG/ML PO CONC
0.5000 mg | ORAL | Status: DC | PRN
  Filled 2022-01-25: qty 0.3

## 2022-01-25 MED ORDER — CHLORHEXIDINE GLUCONATE CLOTH 2 % EX PADS
6.0000 | MEDICATED_PAD | Freq: Every day | CUTANEOUS | Status: DC
  Administered 2022-01-25: 6 via TOPICAL

## 2022-01-25 MED ORDER — MIDAZOLAM HCL 2 MG/2ML IJ SOLN: 2.0000 mg | INTRAMUSCULAR | Status: DC | PRN

## 2022-01-25 MED ORDER — MIDAZOLAM HCL 2 MG/2ML IJ SOLN
INTRAMUSCULAR | Status: AC
  Administered 2022-01-25: 2 mg
  Filled 2022-01-25: qty 2

## 2022-01-25 MED ORDER — LEVETIRACETAM IN NACL 500 MG/100ML IV SOLN
500.0000 mg | Freq: Two times a day (BID) | INTRAVENOUS | Status: DC
  Filled 2022-01-25: qty 100

## 2022-01-25 MED ORDER — HALOPERIDOL 0.5 MG PO TABS
0.5000 mg | ORAL_TABLET | ORAL | Status: DC | PRN
  Filled 2022-01-25: qty 1

## 2022-01-25 MED ORDER — FENTANYL CITRATE PF 50 MCG/ML IJ SOSY: 50.0000 ug | PREFILLED_SYRINGE | INTRAMUSCULAR | Status: DC | PRN

## 2022-01-25 MED ORDER — SODIUM CHLORIDE 0.9 % IV SOLN
3.0000 g | Freq: Two times a day (BID) | INTRAVENOUS | Status: DC
  Administered 2022-01-25: 3 g via INTRAVENOUS
  Filled 2022-01-25: qty 8
  Filled 2022-01-25: qty 3
  Filled 2022-01-25: qty 8

## 2022-01-25 MED ORDER — ACETAMINOPHEN 325 MG PO TABS: 650.0000 mg | ORAL_TABLET | Freq: Four times a day (QID) | ORAL | Status: DC | PRN

## 2022-01-25 MED ORDER — ONDANSETRON 4 MG PO TBDP
4.0000 mg | ORAL_TABLET | Freq: Four times a day (QID) | ORAL | Status: DC | PRN
  Filled 2022-01-25: qty 1

## 2022-01-25 MED ORDER — ACETAMINOPHEN 650 MG RE SUPP: 650.0000 mg | Freq: Four times a day (QID) | RECTAL | Status: DC | PRN

## 2022-01-25 MED ORDER — GLYCOPYRROLATE 0.2 MG/ML IJ SOLN: 0.2000 mg | INTRAMUSCULAR | Status: DC | PRN

## 2022-01-25 MED ORDER — BIOTENE DRY MOUTH MT LIQD: 15.0000 mL | OROMUCOSAL | Status: DC | PRN

## 2022-01-25 MED ORDER — LEVETIRACETAM IN NACL 1500 MG/100ML IV SOLN
1500.0000 mg | Freq: Once | INTRAVENOUS | Status: AC
  Administered 2022-01-25: 1500 mg via INTRAVENOUS
  Filled 2022-01-25: qty 100

## 2022-01-25 MED ORDER — ONDANSETRON HCL 4 MG/2ML IJ SOLN: 4.0000 mg | Freq: Four times a day (QID) | INTRAMUSCULAR | Status: DC | PRN

## 2022-01-25 MED ORDER — POLYVINYL ALCOHOL 1.4 % OP SOLN
1.0000 [drp] | Freq: Four times a day (QID) | OPHTHALMIC | Status: DC | PRN
  Filled 2022-01-25: qty 15

## 2022-01-30 LAB — CULTURE, BLOOD (ROUTINE X 2)
Culture: NO GROWTH
Culture: NO GROWTH
Special Requests: ADEQUATE

## 2022-02-10 ENCOUNTER — Ambulatory Visit: Payer: HMO | Admitting: Medical

## 2022-02-15 NOTE — Progress Notes (Signed)
Nutrition Brief Note  Chart reviewed.  Case discussed with MD, RN, and during ICU rounds. Plan for no escalation of care and awaiting family to arrive for one way extubation.  No further nutrition interventions planned at this time.  Please re-consult as needed.   Loistine Chance, RD, LDN, [place] Registered Dietitian II Certified Diabetes Care and Education Specialist Please refer to [facility] for RD and/or RD on-call/weekend/after hours pager

## 2022-02-15 NOTE — Procedures (Signed)
Extubated to comfort care per NP order.  Family and RN at bedside.  No complications.

## 2022-02-15 NOTE — Progress Notes (Signed)
During assessment pt began exhibiting possible seizure like activity, RN notified NP Ouma, orders received to titrate Propofol from 60mcg to 59mcgs and Keppra.

## 2022-02-15 NOTE — Death Summary Note (Signed)
NAME:  [name], MRN:  [id number], DOB:  1935-08-29, LOS: 1 ADMISSION DATE:  02/03/2022, CONSULTATION DATE:  01/30/2022 REFERRING MD: Duffy Bruce, MD CHIEF COMPLAINT: Suicide attempt     DEATH SUMMARY    PATIENT PASSED AWAY AT 1401 ON 02/20/22.  PATIENT WAS DNR  87  y.o with significant PMH of who presented to the ED with chief complaints of chronic combined systolic and diastolic dysfunction CHF, stage III chronic kidney disease, coronary artery disease, COPD, bipolar disorder, history of prostate cancer treated with radiation therapy in 1994 and s/p penile prosthesis in 2010 with revision in 2014 who presented to the ED from [facility] in [place] Cardiac arrest following attempted suicide by hanging.  Per ED notes and EMS run sheet, patient was found by [facility] staff hanging by electrical TV cord after an unknown period of asphyxia. He required cardiopulmonary resuscitation for a pulseless electrical activity (PEA) arrest for approximately 10 minutes, and the administration of oxygen, and 2 rounds of CPR/Epi, following which ROSC was obtained. Tracheal intubation was attempted but failed due to inability to visualize vocal cords therefore a king airway was placed without complications. Patient was placed in C spine precautions spinal precautions and transferred to the emergency room.  ED Course: On arrival to the ED, he was hypothermic 34.4 with blood pressure 83/48 mm Hg and pulse rate 68 beats/min. He was noted to have dilated, unreactive pupils and absent reflexes. Further inotropic agents were not required and oxygenation was adequate with an FiO2 of 0.6 on positive pressure ventilation. Extensive surgical emphysema of the neck and upper anterior chest was not evident. CT neck did not identify any abnormalities of the cervical spine or soft tissues, patchy opacity within the left mainstem bronchus concerning for aspiration. CT brain scan demonstrated no features  consistent with a severe hypoxic brain injury.  PCCM consulted for admission and management. Pertinent Labs in Red/Diagnostics Findings: Na+/ K+: 130/3.9 Glucose: 131 BUN/Cr.: 52/3.51 Calcium: 8.4   WBC/ TMAX: 9.4/ 34.4 Hgb/Hct: 9.7/30.4 Lactic acid: 1.2>6.2 COVID PCR: Negative   Troponin: 57 Venous Blood Gas result:  pO2 48; pCO2 41; pH 7.25;  HCO3 18, %O2 Sat 75.8.  Past Medical History    Alcoholism ([place])     Asthma     Bipolar affective disorder ([place])     Chronic combined systolic and diastolic CHF (congestive heart failure) ([place])      a. 08/2016 Echo: EF 40-45%, mild AS, mild to mod MR, mildly dil LA/RA, mild-mod TR; b. 07/2017 Echo: EF 40-45%, mod LVH, Gr1 DD, mild to mod AS, mildly dil LA, nl RV fxn; c. 03/2019 Echo: EF 45-50%, AS (not severe). Mod dil PA.   CKD (chronic kidney disease), stage III (HCC)     COPD (chronic obstructive pulmonary disease) (HCC)     Coronary artery disease      a. 01/2004 s/p PCI and Taxus DES to dRCA (3.5 x 12 mm); b. 07/2017 Lexiscan MV: no ischemia. Sm area of apicl thinning, likely attenuation. EF 33% (GI uptake noted)-->Low risk; c. 03/2019 Inf STEMI/PCI: LM nl, LAD min irregs, D1 20ost, RI 20ost, LCX nl, RCA 90p/43m (4.0x26 Resolute Onyx DES), 40m (4.0x15 Resolute Onyx DES), 10d ISR.   Degenerative joint disease      knees and hip   Dyspnea      on exertion   Essential hypertension     Hyperlipidemia     Hypertension      controlled on  meds   Ischemic cardiomyopathy      a. 08/2016 Echo: EF 40-45%; b. 07/2017 Echo: EF 40-45%; c. 03/2019 Echo: EF 45-50%.   OSA (obstructive sleep apnea)     Prostate CA (HCC)      prostate ca dx 20 yrs ago   PVD (peripheral vascular disease) ([place])     Tobacco abuse    Significant Hospital Events   7/2: Admitted to ICU with PEA cardiac arrest following attempted suicide by hanging  Consults:  Neurology  Procedures:  7/2: Intubation  Significant Diagnostic Tests:  2/7: Noncontrast CT head> no acute  intracranial abnormality 2/7: CT Soft Tissue Neck>1. Chronic right maxillary sinusitis. Stranding within the adjacent right retro antral fat suggests acute on chronic disease and/or invasive fungal sinusitis. Correlation with physical exam recommended. 2. Patchy opacity within the superior segment of the left lower lobe, suspicious for infiltrate. Possible aspiration could be considered given intubation and partially visualized secretions within the left mainstem bronchus. 3. Small layering left pleural effusion partially visualized. 4. No other acute abnormality within the neck.  Micro Data:  7/2: SARS-CoV-2 PCR> negative 7/2: Influenza PCR> negative 7/2: Blood culture x2> 7/2: Urine Culture> 7/2: MRSA PCR>>   Antimicrobials:  Unasyn 2/8>  OBJECTIVE  Blood pressure (!) 90/50, pulse 66, temperature 97.8 F (36.6 C), temperature source Oral, resp. rate 16, height (P) 5\' 7"  (1.702 m), weight (P) 96 kg, SpO2 100 %.    Vent Mode: PRVC FiO2 (%):  [30 %-100 %] 30 % Set Rate:  [20 bmp] 20 bmp Vt Set:  [500 mL] 500 mL PEEP:  [5 cmH20] 5 cmH20 Plateau Pressure:  [10 cmH20] 10 cmH20   Intake/Output Summary (Last 24 hours) at Feb 08, 2022 1507 Last data filed at 2022/02/08 1200 Gross per 24 hour  Intake 1188.87 ml  Output 500 ml  Net 688.87 ml    Filed Weights   2022-02-08 0000 08-Feb-2022 1405  Weight: 95.4 kg (P) 96 kg     Physical Examination  GENERAL: 87 year-old critically ill patient lying in the bed intubated, mechanically ventilated and sedated EYES: Pupils equal, round, reactive to light and accommodation. No scleral icterus. Extraocular muscles intact.  HEENT: Head atraumatic, normocephalic. Oropharynx and nasopharynx clear.  NECK:  Supple, no jugular venous distention. No thyroid enlargement, no tenderness.  LUNGS: Decreased breath sounds bilaterally, no wheezing, rales,rhonchi or crepitation. No use of accessory muscles of respiration.  CARDIOVASCULAR: S1, S2 normal. No  murmurs, rubs, or gallops.  ABDOMEN: Soft, nontender, nondistended. Bowel sounds present. No organomegaly or mass.  EXTREMITIES: No pedal edema, cyanosis, or clubbing.  NEUROLOGIC:  Mental Status: Patient does not respond to verbal stimuli.  Does not respond to deep sternal rub.  Does not follow commands.  No verbalizations noted.  Cranial Nerves: II: patient does not respond confrontation bilaterally, pupils right 1 mm, left 1 mm,and nonreactive bilaterally III,IV,VI: doll's response absent bilaterally. V,VII: corneal reflex absent bilaterally  VIII: patient does not respond to verbal stimuli IX,X: gag reflex absent, XI: trapezius strength unable to test bilaterally XII: tongue strength unable to test Motor: Extremities flaccid throughout.  No spontaneous movement noted.  No purposeful movements noted. Sensory: Does not respond to noxious stimuli in any extremity. Deep Tendon Reflexes: Absent throughout. Plantars: absent bilaterally Cerebellar: Unable to perform PSYCHIATRIC: The patient is intubated and sedated SKIN: No obvious rash, lesion, or ulcer.   Labs/imaging that I havepersonally reviewed  (right click and "Reselect all SmartList Selections" daily)     Labs  CBC: Recent Labs  Lab 02/13/2022 2104 02/02/2022 0507  WBC 9.4 10.6*  NEUTROABS 4.9  --   HGB 9.7* 8.5*  HCT 30.4* 25.7*  MCV 87.1 84.3  PLT 351 267     Basic Metabolic Panel: Recent Labs  Lab 01/23/2022 2104 2022-02-02 0004 2022/02/02 0507  NA 130*  --  133*  K 3.9  --  3.7  CL 98  --  102  CO2 17*  --  17*  GLUCOSE 131*  --  109*  BUN 52*  --  56*  CREATININE 3.51* 3.37* 3.48*  CALCIUM 8.4*  --  7.9*  MG  --   --  2.6*  PHOS  --   --  4.5    GFR: Estimated Creatinine Clearance: 17.4 mL/min (A) (by C-G formula based on SCr of 3.48 mg/dL (H)). Recent Labs  Lab 02/08/2022 2104 02/12/2022 2259 02/02/2022 0507  WBC 9.4  --  10.6*  LATICACIDVEN 6.2* 2.9*  --      Liver Function Tests: Recent Labs  Lab  01/23/2022 2104  AST 27  ALT 12  ALKPHOS 127*  BILITOT 0.9  PROT 7.3  ALBUMIN 2.9*    No results for input(s): LIPASE, AMYLASE in the last 168 hours. No results for input(s): AMMONIA in the last 168 hours.  ABG    Component Value Date/Time   HCO3 18.0 (L) 01/26/2022 2102   TCO2 26 01/04/2011 0601   ACIDBASEDEF 8.7 (H) 01/23/2022 2102   O2SAT 75.8 01/26/2022 2102      Coagulation Profile: No results for input(s): INR, PROTIME in the last 168 hours.  Cardiac Enzymes: No results for input(s): CKTOTAL, CKMB, CKMBINDEX, TROPONINI in the last 168 hours.  HbA1C: Hgb A1c MFr Bld  Date/Time Value Ref Range Status  06/29/2021 12:22 PM 6.1 (H) 4.8 - 5.6 % Final    Comment:    (NOTE) Pre diabetes:          5.7%-6.4%  Diabetes:              >6.4%  Glycemic control for   <7.0% adults with diabetes   04/01/2019 04:31 AM 6.2 (H) 4.8 - 5.6 % Final    Comment:    (NOTE)         Prediabetes: 5.7 - 6.4         Diabetes: >6.4         Glycemic control for adults with diabetes: <7.0     CBG: Recent Labs  Lab 01/18/2022 2051 02/13/2022 2324 02/02/22 0338  GLUCAP 113* 102* 123*     Review of Systems:   UNABLE TO OBTAIN PATIENT IS INTUBATED AND SEDATED WITH AMS  Past Medical History  He,  has a past medical history of Alcoholism ([place]), Asthma, Bipolar affective disorder ([place]), Chronic combined systolic and diastolic CHF (congestive heart failure) ([place]), CKD (chronic kidney disease), stage III ([place]), COPD (chronic obstructive pulmonary disease) ([place]), Coronary artery disease, Degenerative joint disease, Dyspnea, Essential hypertension, Hyperlipidemia, Hypertension, Ischemic cardiomyopathy, OSA (obstructive sleep apnea), Prostate CA ([place]), PVD (peripheral vascular disease) ([place]), and Tobacco abuse.   Surgical History    Past Surgical History:  Procedure Laterality Date   CARDIAC CATHETERIZATION     CORONARY ANGIOGRAPHY N/A 04/01/2019   Procedure: CORONARY ANGIOGRAPHY;  Surgeon:  Nelva Bush, MD;  Location: [facility];  Service: Cardiovascular;  Laterality: N/A;   CORONARY ANGIOPLASTY WITH STENT PLACEMENT     x2   CORONARY/GRAFT ACUTE MI REVASCULARIZATION N/A 04/01/2019   Procedure:  Coronary/Graft Acute MI Revascularization;  Surgeon: Nelva Bush, MD;  Location: [facility];  Service: Cardiovascular;  Laterality: N/A;   TOTAL HIP ARTHROPLASTY     right     Social History   reports that he has quit smoking. His smoking use included cigarettes. He has a 50.00 pack-year smoking history. He has never used smokeless tobacco. He reports that he does not currently use alcohol. He reports that he does not use drugs.   Family History   His family history includes Heart attack in his father and mother; Hyperlipidemia in his father and mother; Hypertension in his father and mother. There is no history of Prostate cancer, Bladder Cancer, or Kidney cancer.   Allergies Allergies  Allergen Reactions   Chlorthalidone Other (See Comments)    Hyponatremia   Ace Inhibitors Cough   Hydrochlorothiazide    Lisinopril Other (See Comments)   Losartan    Other Other (See Comments)    "ANY BLOOD PRESSURE MEDICATIONS THAT I'VE TRIED" - PT. DOES NOT REMEMBER WHICH ONES "ANY BLOOD PRESSURE MEDICATIONS THAT I'VE TRIED"- PT. DOES NOT REMEMBER WHICH ONES- CONSTIPATION   Benazepril-Hydrochlorothiazide Other (See Comments)    Constipation     Home Medications  Prior to Admission medications   Medication Sig Start Date End Date Taking? Authorizing Provider  ADULT ASPIRIN REGIMEN 81 MG EC tablet TAKE 1 TABLET BY MOUTH DAILY 03/08/20   [name], MD  albuterol (VENTOLIN HFA) 108 (90 Base) MCG/ACT inhaler USE 2 PUFFS EVERY 4 HOURS AS NEEDED 10/14/19   Darylene Price A, FNP  Amino Acids-Protein Hydrolys (PRO-STAT PO) Take 30 mLs by mouth 2 (two) times daily.    [provider]  atorvastatin (LIPITOR) 40 MG tablet Take 40 mg by mouth daily.     [provider]  azelastine (ASTELIN) 0.1 % nasal spray Place 1 spray into both nostrils daily as needed for allergies.    [provider]  bisacodyl (DULCOLAX) 10 MG suppository Place 10 mg rectally as needed for moderate constipation.    [provider]  calcium carbonate (OSCAL) 1500 (600 Ca) MG TABS tablet Take 1 tablet (1,500 mg total) by mouth 2 (two) times daily with a meal. 11/29/20 12/18/48  Milinda Pointer, MD  carvedilol (COREG) 6.25 MG tablet TAKE 1 TABLET BY MOUTH TWICE A DAY 08/15/21   [name], MD  clopidogrel (PLAVIX) 75 MG tablet Take 1 tablet (75 mg total) by mouth daily. 10/28/19   [name], MD  feeding supplement (ENSURE ENLIVE / ENSURE PLUS) LIQD Take 237 mLs by mouth 3 (three) times daily between meals. 01/13/21   Lorella Nimrod, MD  fluticasone (FLONASE) 50 MCG/ACT nasal spray Place 2 sprays into both nostrils daily.    [provider]  furosemide (LASIX) 40 MG tablet TAKE 1 TABLET BY MOUTH DAILY AND TAKE ANADDITIONAL TABLET IN THE AFTERNOON AS NEEDED FOR WEIGHT GAIN AND LEG EDEMA 04/21/21   [name], MD  ipratropium (ATROVENT) 0.03 % nasal spray Place 2 sprays into both nostrils daily. 05/07/20   [provider]  isosorbide mononitrate (IMDUR) 30 MG 24 hr tablet Take 30 mg by mouth daily. 10/26/21   [provider]  lubiprostone (AMITIZA) 8 MCG capsule Take 8 mcg by mouth 2 (two) times daily. 08/24/21   [provider]  Magnesium Hydroxide (MILK OF MAGNESIA PO) Take 30 mLs by mouth as needed.    [provider]  Magnesium Oxide 500 MG CAPS Take 1  capsule (500 mg total) by mouth daily. 11/29/20 12/18/48  Milinda Pointer, MD  melatonin 5 MG TABS Take 5 mg by mouth at bedtime.    [provider]  nitroGLYCERIN (NITROSTAT) 0.4 MG SL tablet Take 0.4 mg by mouth every 5 (five) minutes x 3 doses as needed for chest pain. As needed for chest pain 12/06/15   [provider]   NON FORMULARY Mech soft diet without theraputic retriction    [provider]  oxyCODONE (OXY IR/ROXICODONE) 5 MG immediate release tablet Take 1 tablet (5 mg total) by mouth every 8 (eight) hours as needed for severe pain. Must last 30 days. 12/13/21   Medina-Vargas, Monina C, NP  QUEtiapine (SEROQUEL) 50 MG tablet Take 50 mg by mouth at bedtime.    [provider]  Sodium Phosphates (RA SALINE ENEMA RE) Place 1 Dose rectally as needed.    [provider]  tamsulosin (FLOMAX) 0.4 MG CAPS capsule Take 0.4 mg by mouth daily.    [provider]  Donnal Debar 200-62.5-25 MCG/INH AEPB Inhale 1 puff into the lungs daily.  04/08/20   [provider]  Cholecalciferol (VITAMIN D3) 125 MCG (5000 UT) CAPS Take 1 capsule (5,000 Units total) by mouth daily with breakfast. Take along with calcium and magnesium. Patient not taking: Reported on 01/27/2021 11/29/20 01/28/21  Milinda Pointer, MD  Scheduled Meds:   Continuous Infusions:  fentaNYL infusion INTRAVENOUS 12.5 mcg/hr (01-27-2022 0600)   propofol (DIPRIVAN) infusion 30 mcg/kg/min (01-27-2022 1015)   PRN Meds:.acetaminophen **OR** acetaminophen, antiseptic oral rinse, fentaNYL (SUBLIMAZE) injection, glycopyrrolate **OR** glycopyrrolate **OR** glycopyrrolate, haloperidol **OR** haloperidol **OR** haloperidol lactate, ipratropium-albuterol, midazolam, ondansetron **OR** ondansetron (ZOFRAN) IV, polyvinyl alcohol  Assessment & Plan:  [place] cardiac arrest secondary to Asphyxiation in attempted Suicide by Hanging Circulatory shock Unknown minutes downtime, unknown min before CPR initiated, received 0 defibrillations, suspected anoxic injury  UDS: pending -Not candidate for TTM protocol due to poor neurological exam, will maintain normothermia -Vasopressors PRN, to maintain MAP > 70 -Echocardiogram ordered -Trend troponin, lactic -Cardiology consulted, as appropriate  Acute hypoxic respiratory failure in the  setting of PEA Cardiac Arrest and Aspiration Pneumonia PMHx: OSA, tobacco abuse -continue ventilator support & lung protective strategies -Wean PEEP and FiO2 for sats greater than 90% -Plateau pressures less than 30 cm H20 -VAP bundle in place -Intermittent chest x-ray & ABG -Ensure adequate pulmonary hygiene  -F/u cultures, trend PCT -Continue Aspiration Pna coverage with Unasyn  Suspected Anoxic Brain Injury Post-hypoxic myoclonic seizures Like Activity -Load with IV Keppra 1500 mg and continue maintenance dose 500 mg twice daily -Continue propofol for burst suppression -Hold daily WUA  -Assess EEG  -repeat CT head -Neuro consult   Chronic combined systolic and diastolic CHF(last known EF 60 to 65%) -Hypertension PMHx: CAD 01/2004 s/p PCI and Taxus DES to dRCA (3.5 x 12 mm);  03/2019 Inf STEMI/PCI: LM nl, LAD min irregs, D1 20ost, RI 20ost, LCX nl, RCA 90p/17m , HLD -Continuous cardiac monitoring -Maintain MAP greater than 65 -IV Lasix as blood pressure and renal function permits; currently on Lasix 40 mg daily at home -Continue Coreg, Imdur once able to take p.o. -Continue atorvastatin 40 mg -Repeat 2D Echocardiogram    AKI on CKD Stage III Mild Hyponatremia Metabolic Acidosis due to Lactic Acidosis -Monitor I&O's / urinary output -Follow BMP -Ensure adequate renal perfusion -Avoid nephrotoxic agents as able -Replace electrolytes as indicated  Bipolar disorder Anxiety and depression -Hold quetiapine -will need sitter/ SI precautions and psych  consult after acute illness/ extubation although prognosis remains poor  Urinary Retention Multiple attempts to place Foley, has been placed by Urology in the past Hx. prostate cancer treated with radiation therapy in 1994 and s/p penile prosthesis  -Will need Foley placement by Urology -Bladder scan q4-6 hours     Best practice:  Diet:  NPO Pain/Anxiety/Delirium protocol (if indicated): Yes (RASS goal 0) VAP protocol (if  indicated): Yes DVT prophylaxis: Subcutaneous Heparin GI prophylaxis: PPI Glucose control:  SSI Yes Central venous access:  N/A Arterial line:  N/A Foley:  N/A Mobility:  bed rest  PT consulted: N/A Last date of multidisciplinary goals of care discussion [2/7] Code Status:  DNR Disposition: ICU   = Goals of Care = Code Status Order: FULL  Primary Emergency Contact: [place] ([place]), Home Phone: [phone] Patient wishes to pursue ongoing treatment, but concurred that if deteriorated to pulselessness, patient would prefer a natural death as opposed to invasive measures such as CPR and intubation.  Family should be immediately contacted in such situations.   Ottie Glazier, M.D.  Pulmonary & [place]

## 2022-02-15 NOTE — Progress Notes (Deleted)
No show to appointment.

## 2022-02-15 NOTE — H&P (Addendum)
NAME:  [name], MRN:  [id number], DOB:  1935-08-16, LOS: 1 ADMISSION DATE:  02/11/2022, CONSULTATION DATE:  01/19/2022 REFERRING MD: Duffy Bruce, MD CHIEF COMPLAINT: Suicide attempt    HPI  87  y.o with significant PMH of who presented to the ED with chief complaints of chronic combined systolic and diastolic dysfunction CHF, stage III chronic kidney disease, coronary artery disease, COPD, bipolar disorder, history of prostate cancer treated with radiation therapy in 1994 and s/p penile prosthesis in 2010 with revision in 2014 who presented to the ED from [facility] in [place] Cardiac arrest following attempted suicide by hanging.  Per ED notes and EMS run sheet, patient was found by [place] staff hanging by electrical TV cord after an unknown period of asphyxia. He required cardiopulmonary resuscitation for a pulseless electrical activity (PEA) arrest for approximately 10 minutes, and the administration of oxygen, and 2 rounds of CPR/Epi, following which ROSC was obtained. Tracheal intubation was attempted but failed due to inability to visualize vocal cords therefore a king airway was placed without complications. Patient was placed in C spine precautions spinal precautions and transferred to the emergency room.  ED Course: On arrival to the ED, he was hypothermic 34.4 with blood pressure 83/48 mm Hg and pulse rate 68 beats/min. He was noted to have dilated, unreactive pupils and absent reflexes. Further inotropic agents were not required and oxygenation was adequate with an FiO2 of 0.6 on positive pressure ventilation. Extensive surgical emphysema of the neck and upper anterior chest was not evident. CT neck did not identify any abnormalities of the cervical spine or soft tissues, patchy opacity within the left mainstem bronchus concerning for aspiration. CT brain scan demonstrated no features consistent with a severe hypoxic brain injury.  PCCM consulted for admission and  management. Pertinent Labs in Red/Diagnostics Findings: Na+/ K+: 130/3.9 Glucose: 131 BUN/Cr.: 52/3.51 Calcium: 8.4   WBC/ TMAX: 9.4/ 34.4 Hgb/Hct: 9.7/30.4 Lactic acid: 1.2>6.2 COVID PCR: Negative   Troponin: 57 Venous Blood Gas result:  pO2 48; pCO2 41; pH 7.25;  HCO3 18, %O2 Sat 75.8.  Past Medical History    Alcoholism ([place])     Asthma     Bipolar affective disorder ([place])     Chronic combined systolic and diastolic CHF (congestive heart failure) ([place])      a. 08/2016 Echo: EF 40-45%, mild AS, mild to mod MR, mildly dil LA/RA, mild-mod TR; b. 07/2017 Echo: EF 40-45%, mod LVH, Gr1 DD, mild to mod AS, mildly dil LA, nl RV fxn; c. 03/2019 Echo: EF 45-50%, AS (not severe). Mod dil PA.   CKD (chronic kidney disease), stage III (HCC)     COPD (chronic obstructive pulmonary disease) (HCC)     Coronary artery disease      a. 01/2004 s/p PCI and Taxus DES to dRCA (3.5 x 12 mm); b. 07/2017 Lexiscan MV: no ischemia. Sm area of apicl thinning, likely attenuation. EF 33% (GI uptake noted)-->Low risk; c. 03/2019 Inf STEMI/PCI: LM nl, LAD min irregs, D1 20ost, RI 20ost, LCX nl, RCA 90p/36m (4.0x26 Resolute Onyx DES), 97m (4.0x15 Resolute Onyx DES), 10d ISR.   Degenerative joint disease      knees and hip   Dyspnea      on exertion   Essential hypertension     Hyperlipidemia     Hypertension      controlled on meds   Ischemic cardiomyopathy      a. 08/2016 Echo: EF 40-45%; b.  07/2017 Echo: EF 40-45%; c. 03/2019 Echo: EF 45-50%.   OSA (obstructive sleep apnea)     Prostate CA (HCC)      prostate ca dx 20 yrs ago   PVD (peripheral vascular disease) ([place])     Tobacco abuse    Significant Hospital Events   7/2: Admitted to ICU with PEA cardiac arrest following attempted suicide by hanging  Consults:  Neurology  Procedures:  7/2: Intubation  Significant Diagnostic Tests:  2/7: Noncontrast CT head> no acute intracranial abnormality 2/7: CT Soft Tissue Neck>1. Chronic right maxillary  sinusitis. Stranding within the adjacent right retro antral fat suggests acute on chronic disease and/or invasive fungal sinusitis. Correlation with physical exam recommended. 2. Patchy opacity within the superior segment of the left lower lobe, suspicious for infiltrate. Possible aspiration could be considered given intubation and partially visualized secretions within the left mainstem bronchus. 3. Small layering left pleural effusion partially visualized. 4. No other acute abnormality within the neck.  Micro Data:  7/2: SARS-CoV-2 PCR> negative 7/2: Influenza PCR> negative 7/2: Blood culture x2> 7/2: Urine Culture> 7/2: MRSA PCR>>   Antimicrobials:  Unasyn 2/8>  OBJECTIVE  Blood pressure (!) 83/47, pulse 64, temperature 98.1 F (36.7 C), temperature source Axillary, resp. rate 20, height 5\' 7"  (1.702 m), weight 95.4 kg, SpO2 100 %.    Vent Mode: PRVC FiO2 (%):  [40 %-100 %] 40 % Set Rate:  [20 bmp] 20 bmp Vt Set:  [500 mL] 500 mL PEEP:  [5 cmH20] 5 cmH20   Intake/Output Summary (Last 24 hours) at 02/06/2022 0447 Last data filed at Feb 06, 2022 0400 Gross per 24 hour  Intake 1016.87 ml  Output --  Net 1016.87 ml   Filed Weights   February 06, 2022 0000  Weight: 95.4 kg     Physical Examination  GENERAL: 87 year-old critically ill patient lying in the bed intubated, mechanically ventilated and sedated EYES: Pupils equal, round, reactive to light and accommodation. No scleral icterus. Extraocular muscles intact.  HEENT: Head atraumatic, normocephalic. Oropharynx and nasopharynx clear.  NECK:  Supple, no jugular venous distention. No thyroid enlargement, no tenderness.  LUNGS: Decreased breath sounds bilaterally, no wheezing, rales,rhonchi or crepitation. No use of accessory muscles of respiration.  CARDIOVASCULAR: S1, S2 normal. No murmurs, rubs, or gallops.  ABDOMEN: Soft, nontender, nondistended. Bowel sounds present. No organomegaly or mass.  EXTREMITIES: No pedal edema,  cyanosis, or clubbing.  NEUROLOGIC:  Mental Status: Patient does not respond to verbal stimuli.  Does not respond to deep sternal rub.  Does not follow commands.  No verbalizations noted.  Cranial Nerves: II: patient does not respond confrontation bilaterally, pupils right 1 mm, left 1 mm,and nonreactive bilaterally III,IV,VI: doll's response absent bilaterally. V,VII: corneal reflex absent bilaterally  VIII: patient does not respond to verbal stimuli IX,X: gag reflex absent, XI: trapezius strength unable to test bilaterally XII: tongue strength unable to test Motor: Extremities flaccid throughout.  No spontaneous movement noted.  No purposeful movements noted. Sensory: Does not respond to noxious stimuli in any extremity. Deep Tendon Reflexes: Absent throughout. Plantars: absent bilaterally Cerebellar: Unable to perform PSYCHIATRIC: The patient is intubated and sedated SKIN: No obvious rash, lesion, or ulcer.   Labs/imaging that I havepersonally reviewed  (right click and "Reselect all SmartList Selections" daily)     Labs   CBC: Recent Labs  Lab 01/28/2022 2104  WBC 9.4  NEUTROABS 4.9  HGB 9.7*  HCT 30.4*  MCV 87.1  PLT 259    Basic Metabolic Panel:  Recent Labs  Lab 02/11/2022 2104 02/24/2022 0004  NA 130*  --   K 3.9  --   CL 98  --   CO2 17*  --   GLUCOSE 131*  --   BUN 52*  --   CREATININE 3.51* 3.37*  CALCIUM 8.4*  --    GFR: Estimated Creatinine Clearance: 18 mL/min (A) (by C-G formula based on SCr of 3.37 mg/dL (H)). Recent Labs  Lab 01/30/2022 2104 01/22/2022 2259  WBC 9.4  --   LATICACIDVEN 6.2* 2.9*    Liver Function Tests: Recent Labs  Lab 02/04/2022 2104  AST 27  ALT 12  ALKPHOS 127*  BILITOT 0.9  PROT 7.3  ALBUMIN 2.9*   No results for input(s): LIPASE, AMYLASE in the last 168 hours. No results for input(s): AMMONIA in the last 168 hours.  ABG    Component Value Date/Time   HCO3 18.0 (L) 01/30/2022 2102   TCO2 26 01/04/2011 0601    ACIDBASEDEF 8.7 (H) 01/31/2022 2102   O2SAT 75.8 02/03/2022 2102     Coagulation Profile: No results for input(s): INR, PROTIME in the last 168 hours.  Cardiac Enzymes: No results for input(s): CKTOTAL, CKMB, CKMBINDEX, TROPONINI in the last 168 hours.  HbA1C: Hgb A1c MFr Bld  Date/Time Value Ref Range Status  06/29/2021 12:22 PM 6.1 (H) 4.8 - 5.6 % Final    Comment:    (NOTE) Pre diabetes:          5.7%-6.4%  Diabetes:              >6.4%  Glycemic control for   <7.0% adults with diabetes   04/01/2019 04:31 AM 6.2 (H) 4.8 - 5.6 % Final    Comment:    (NOTE)         Prediabetes: 5.7 - 6.4         Diabetes: >6.4         Glycemic control for adults with diabetes: <7.0     CBG: Recent Labs  Lab 01/18/2022 2051 02/04/2022 2324 02-24-22 0338  GLUCAP 113* 102* 123*    Review of Systems:   UNABLE TO OBTAIN PATIENT IS INTUBATED AND SEDATED WITH AMS  Past Medical History  He,  has a past medical history of Alcoholism ([place]), Asthma, Bipolar affective disorder ([place]), Chronic combined systolic and diastolic CHF (congestive heart failure) ([place]), CKD (chronic kidney disease), stage III ([place]), COPD (chronic obstructive pulmonary disease) ([place]), Coronary artery disease, Degenerative joint disease, Dyspnea, Essential hypertension, Hyperlipidemia, Hypertension, Ischemic cardiomyopathy, OSA (obstructive sleep apnea), Prostate CA ([place]), PVD (peripheral vascular disease) ([place]), and Tobacco abuse.   Surgical History    Past Surgical History:  Procedure Laterality Date   CARDIAC CATHETERIZATION     CORONARY ANGIOGRAPHY N/A 04/01/2019   Procedure: CORONARY ANGIOGRAPHY;  Surgeon: Nelva Bush, MD;  Location: [facility];  Service: Cardiovascular;  Laterality: N/A;   CORONARY ANGIOPLASTY WITH STENT PLACEMENT     x2   CORONARY/GRAFT ACUTE MI REVASCULARIZATION N/A 04/01/2019   Procedure: Coronary/Graft Acute MI Revascularization;  Surgeon: Nelva Bush, MD;  Location: [facility];  Service: Cardiovascular;  Laterality: N/A;   TOTAL HIP ARTHROPLASTY     right     Social History   reports that he has quit smoking. His smoking use included cigarettes. He has a 50.00 pack-year smoking history. He has never used smokeless tobacco. He reports that he does not currently use alcohol. He reports that he does not use drugs.  Family History   His family history includes Heart attack in his father and mother; Hyperlipidemia in his father and mother; Hypertension in his father and mother. There is no history of Prostate cancer, Bladder Cancer, or Kidney cancer.   Allergies Allergies  Allergen Reactions   Chlorthalidone Other (See Comments)    Hyponatremia   Ace Inhibitors Cough   Hydrochlorothiazide    Lisinopril Other (See Comments)   Losartan    Other Other (See Comments)    "ANY BLOOD PRESSURE MEDICATIONS THAT I'VE TRIED" - PT. DOES NOT REMEMBER WHICH ONES "ANY BLOOD PRESSURE MEDICATIONS THAT I'VE TRIED"- PT. DOES NOT REMEMBER WHICH ONES- CONSTIPATION   Benazepril-Hydrochlorothiazide Other (See Comments)    Constipation     Home Medications  Prior to Admission medications   Medication Sig Start Date End Date Taking? Authorizing Provider  ADULT ASPIRIN REGIMEN 81 MG EC tablet TAKE 1 TABLET BY MOUTH DAILY 03/08/20   [name], MD  albuterol (VENTOLIN HFA) 108 (90 Base) MCG/ACT inhaler USE 2 PUFFS EVERY 4 HOURS AS NEEDED 10/14/19   Darylene Price A, FNP  Amino Acids-Protein Hydrolys (PRO-STAT PO) Take 30 mLs by mouth 2 (two) times daily.    [provider]  atorvastatin (LIPITOR) 40 MG tablet Take 40 mg by mouth daily.    [provider]  azelastine (ASTELIN) 0.1 % nasal spray Place 1 spray into both nostrils daily as needed for allergies.    [provider]  bisacodyl (DULCOLAX) 10 MG suppository Place 10 mg rectally as needed for moderate constipation.    [provider]  calcium carbonate (OSCAL) 1500 (600  Ca) MG TABS tablet Take 1 tablet (1,500 mg total) by mouth 2 (two) times daily with a meal. 11/29/20 12/18/48  Milinda Pointer, MD  carvedilol (COREG) 6.25 MG tablet TAKE 1 TABLET BY MOUTH TWICE A DAY 08/15/21   [name], MD  clopidogrel (PLAVIX) 75 MG tablet Take 1 tablet (75 mg total) by mouth daily. 10/28/19   [name], MD  feeding supplement (ENSURE ENLIVE / ENSURE PLUS) LIQD Take 237 mLs by mouth 3 (three) times daily between meals. 01/13/21   Lorella Nimrod, MD  fluticasone (FLONASE) 50 MCG/ACT nasal spray Place 2 sprays into both nostrils daily.    [provider]  furosemide (LASIX) 40 MG tablet TAKE 1 TABLET BY MOUTH DAILY AND TAKE ANADDITIONAL TABLET IN THE AFTERNOON AS NEEDED FOR WEIGHT GAIN AND LEG EDEMA 04/21/21   [name], MD  ipratropium (ATROVENT) 0.03 % nasal spray Place 2 sprays into both nostrils daily. 05/07/20   [provider]  isosorbide mononitrate (IMDUR) 30 MG 24 hr tablet Take 30 mg by mouth daily. 10/26/21   [provider]  lubiprostone (AMITIZA) 8 MCG capsule Take 8 mcg by mouth 2 (two) times daily. 08/24/21   [provider]  Magnesium Hydroxide (MILK OF MAGNESIA PO) Take 30 mLs by mouth as needed.    [provider]  Magnesium Oxide 500 MG CAPS Take 1 capsule (500 mg total) by mouth daily. 11/29/20 12/18/48  Milinda Pointer, MD  melatonin 5 MG TABS Take 5 mg by mouth at bedtime.    [provider]  nitroGLYCERIN (NITROSTAT) 0.4 MG SL tablet Take 0.4 mg by mouth every 5 (five) minutes x 3 doses as needed for chest pain. As needed for chest pain 12/06/15   [provider]  NON FORMULARY Mech soft diet without theraputic retriction    [provider]  oxyCODONE (OXY IR/ROXICODONE) 5 MG immediate release tablet Take 1 tablet (5 mg total) by mouth every 8 (eight) hours as needed for severe pain. Must last 30 days. 12/13/21   Medina-Vargas, Monina C, NP  QUEtiapine (SEROQUEL) 50 MG  tablet Take 50 mg by mouth at bedtime.    [provider]  Sodium Phosphates (RA SALINE ENEMA RE) Place 1 Dose rectally as needed.    [provider]  tamsulosin (FLOMAX) 0.4 MG CAPS capsule Take 0.4 mg by mouth daily.    [provider]  Donnal Debar 200-62.5-25 MCG/INH AEPB Inhale 1 puff into the lungs daily.  04/08/20   [provider]  Cholecalciferol (VITAMIN D3) 125 MCG (5000 UT) CAPS Take 1 capsule (5,000 Units total) by mouth daily with breakfast. Take along with calcium and magnesium. Patient not taking: Reported on 01/27/2021 11/29/20 01/28/21  Milinda Pointer, MD  Scheduled Meds:  albuterol  2.5 mg Nebulization Q6H   budesonide (PULMICORT) nebulizer solution  0.25 mg Nebulization BID   docusate  100 mg Per Tube BID   heparin  5,000 Units Subcutaneous Q8H   pantoprazole (PROTONIX) IV  40 mg Intravenous QHS   polyethylene glycol  17 g Per Tube Daily   Continuous Infusions:  fentaNYL infusion INTRAVENOUS 25 mcg/hr (2022/01/26 0400)   levETIRAcetam     propofol (DIPRIVAN) infusion 25 mcg/kg/min (01/26/22 0400)   PRN Meds:.docusate sodium, fentaNYL, fentaNYL (SUBLIMAZE) injection, fentaNYL (SUBLIMAZE) injection, ipratropium-albuterol, polyethylene glycol  Assessment & Plan:  PEA cardiac arrest secondary to Asphyxiation in attempted Suicide by Hanging Circulatory shock Unknown minutes downtime, unknown min before CPR initiated, received 0 defibrillations, suspected anoxic injury  UDS: pending -Not candidate for TTM protocol due to poor neurological exam, will maintain normothermia -Vasopressors PRN, to maintain MAP > 70 -Echocardiogram ordered -Trend troponin, lactic -Cardiology consulted, as appropriate  Acute hypoxic respiratory failure in the setting of PEA Cardiac Arrest and Aspiration Pneumonia PMHx: OSA, tobacco abuse -continue ventilator support & lung protective strategies -Wean PEEP and FiO2 for sats greater than 90% -Plateau  pressures less than 30 cm H20 -VAP bundle in place -Intermittent chest x-ray & ABG -Ensure adequate pulmonary hygiene  -F/u cultures, trend PCT -Continue Aspiration Pna coverage with Unasyn  Suspected Anoxic Brain Injury Post-hypoxic myoclonic seizures Like Activity -Load with IV Keppra 1500 mg and continue maintenance dose 500 mg twice daily -Continue propofol for burst suppression -Hold daily WUA  -Assess EEG  -repeat CT head -Neuro consult   Chronic combined systolic and diastolic CHF(last known EF 60 to 65%) -Hypertension PMHx: CAD 01/2004 s/p PCI and Taxus DES to dRCA (3.5 x 12 mm);  03/2019 Inf STEMI/PCI: LM nl, LAD min irregs, D1 20ost, RI 20ost, LCX nl, RCA 90p/33m , HLD -Continuous cardiac monitoring -Maintain MAP greater than 65 -IV Lasix as blood pressure and renal function permits; currently on Lasix 40 mg daily at home -Continue Coreg, Imdur once able to take p.o. -Continue atorvastatin 40 mg -Repeat 2D Echocardiogram    AKI on CKD Stage III Mild Hyponatremia Metabolic Acidosis due to Lactic Acidosis -Monitor I&O's / urinary output -Follow BMP -Ensure adequate renal perfusion -Avoid nephrotoxic agents as able -Replace electrolytes as indicated  Bipolar disorder Anxiety and depression -Hold quetiapine -will need sitter/ SI precautions and psych consult after acute illness/ extubation although prognosis remains poor  Urinary Retention Multiple attempts to place Foley, has been placed by Urology in the past Hx. prostate cancer treated with radiation therapy in 1994 and s/p penile prosthesis  -  Will need Foley placement by Urology -Bladder scan q4-6 hours     Best practice:  Diet:  NPO Pain/Anxiety/Delirium protocol (if indicated): Yes (RASS goal 0) VAP protocol (if indicated): Yes DVT prophylaxis: Subcutaneous Heparin GI prophylaxis: PPI Glucose control:  SSI Yes Central venous access:  N/A Arterial line:  N/A Foley:  N/A Mobility:  bed rest  PT  consulted: N/A Last date of multidisciplinary goals of care discussion [2/7] Code Status:  DNR Disposition: ICU   = Goals of Care = Code Status Order: FULL  Primary Emergency Contact: [name] Patient wishes to pursue ongoing treatment, but concurred that if deteriorated to pulselessness, patient would prefer a natural death as opposed to invasive measures such as CPR and intubation.  Family should be immediately contacted in such situations.  Critical care time: 45 minutes     Rufina Falco, DNP, CCRN, FNP-C, AGACNP-BC Acute Care Nurse Practitioner  [facility] Pager: [phone] [place] at [facility]  .

## 2022-02-15 NOTE — Consult Note (Addendum)
Neurology Consultation  Reason for Consult: Postcardiac arrest myoclonic jerking Referring Physician: Dr Lanney Gins, PCCM  CC: Postcardiac arrest myoclonic jerks  History is obtained from: Chart review  HPI: [name] is a 87 y.o. male past medical history of bipolar disorder, CKD 3, COPD, coronary artery disease, hypertension, hyperlipidemia, peripheral vascular disease, prostate cancer, tentorial meningioma, tobacco and alcohol abuse-brought into the emergency room yesterday from [facility] in [place]A cardiac arrest following an attempted suicide by hanging.  No family available at bedside.  Chart review reveals that patient was found by the staff at the facility hanging by an electrical television cord after an unknown.  Of asphyxia.  CPR was started for PEA with approximately 10 minutes duration prior to ROSC.  King's airway was placed and brought into the emergency room with his head and spinal precautions.  In the emergency department, he was noted to have a temperature of 34.4 Celsius with blood pressure 83/48, heart rate 68, dilated unreactive pupils and absent reflexes.  Extensive surgical emphysema of the neck and upper anterior chest was not seen.  CT neck with no spinal abnormalities.  CT head with no acute changes.  There was patchy opacity within the left mainstem bronchus concerning for aspiration.  CCM admitted the patient.  He was given Keppra load followed by Keppra 500 twice daily for the myoclonic jerking and started on propofol which controlled the myoclonic jerking. As soon as the propofol was reduced or stopped, he started to have recurrence of myoclonic jerking involving the whole body and face.   ROS:  Unable to obtain due to altered mental status.   Past Medical History:  Diagnosis Date   Alcoholism ([place])    Asthma    Bipolar affective disorder ([place])    Chronic combined systolic and diastolic CHF (congestive heart failure) ([place])    a. 08/2016 Echo: EF 40-45%, mild  AS, mild to mod MR, mildly dil LA/RA, mild-mod TR; b. 07/2017 Echo: EF 40-45%, mod LVH, Gr1 DD, mild to mod AS, mildly dil LA, nl RV fxn; c. 03/2019 Echo: EF 45-50%, AS (not severe). Mod dil PA.   CKD (chronic kidney disease), stage III (HCC)    COPD (chronic obstructive pulmonary disease) (HCC)    Coronary artery disease    a. 01/2004 s/p PCI and Taxus DES to dRCA (3.5 x 12 mm); b. 07/2017 Lexiscan MV: no ischemia. Sm area of apicl thinning, likely attenuation. EF 33% (GI uptake noted)-->Low risk; c. 03/2019 Inf STEMI/PCI: LM nl, LAD min irregs, D1 20ost, RI 20ost, LCX nl, RCA 90p/70m (4.0x26 Resolute Onyx DES), 67m (4.0x15 Resolute Onyx DES), 10d ISR.   Degenerative joint disease    knees and hip   Dyspnea    on exertion   Essential hypertension    Hyperlipidemia    Hypertension    controlled on meds   Ischemic cardiomyopathy    a. 08/2016 Echo: EF 40-45%; b. 07/2017 Echo: EF 40-45%; c. 03/2019 Echo: EF 45-50%.   OSA (obstructive sleep apnea)    Prostate CA (HCC)    prostate ca dx 20 yrs ago   PVD (peripheral vascular disease) ([place])    Tobacco abuse    Family History  Problem Relation Age of Onset   Hypertension Mother    Hyperlipidemia Mother    Heart attack Mother    Hypertension Father    Hyperlipidemia Father    Heart attack Father    Prostate cancer Neg Hx    Bladder Cancer Neg Hx  Kidney cancer Neg Hx     Social History:   reports that he has quit smoking. His smoking use included cigarettes. He has a 50.00 pack-year smoking history. He has never used smokeless tobacco. He reports that he does not currently use alcohol. He reports that he does not use drugs.  Medications  Current Facility-Administered Medications:    Chlorhexidine Gluconate Cloth 2 % PADS 6 each, 6 each, Topical, Daily, Ottie Glazier, MD, 6 each at 02-03-2022 1032   docusate sodium (COLACE) capsule 100 mg, 100 mg, Oral, BID PRN, Lang Snow, NP   fentaNYL (SUBLIMAZE) bolus via infusion 25-100  mcg, 25-100 mcg, Intravenous, Q1H PRN, Lang Snow, NP   fentaNYL (SUBLIMAZE) injection 50 mcg, 50 mcg, Intravenous, Q15 min PRN, Duffy Bruce, MD   fentaNYL (SUBLIMAZE) injection 50 mcg, 50 mcg, Intravenous, Q2H PRN, Duffy Bruce, MD   fentaNYL 2557mcg in NS 269mL (3mcg/ml) infusion-PREMIX, 25-200 mcg/hr, Intravenous, Continuous, [place], Bing Neighbors, NP, Last Rate: 1.25 mL/hr at February 03, 2022 0600, 12.5 mcg/hr at Feb 03, 2022 0600   ipratropium-albuterol (DUONEB) 0.5-2.5 (3) MG/3ML nebulizer solution 3 mL, 3 mL, Nebulization, Q6H PRN, Lang Snow, NP   midazolam (VERSED) injection 2 mg, 2 mg, Intravenous, Q1H PRN, Lang Snow, NP, 2 mg at 02-03-2022 0837   polyethylene glycol (MIRALAX / GLYCOLAX) packet 17 g, 17 g, Oral, Daily PRN, Lang Snow, NP   propofol (DIPRIVAN) 1000 MG/100ML infusion, 0-50 mcg/kg/min, Intravenous, Continuous, Ouma, Bing Neighbors, NP, Last Rate: 23.2 mL/hr at 02-03-2022 0726, 40 mcg/kg/min at February 03, 2022 0726  Exam: Current vital signs: BP (!) 90/48    Pulse 68    Temp 97.8 F (36.6 C) (Oral)    Resp (!) 21    Ht 5\' 7"  (1.702 m)    Wt 95.4 kg    SpO2 100%    BMI 32.94 kg/m  Vital signs in last 24 hours: Temp:  [92.3 F (33.5 C)-98.1 F (36.7 C)] 97.8 F (36.6 C) (02/08 0800) Pulse Rate:  [54-86] 68 (02/08 1000) Resp:  [6-25] 21 (02/08 1000) BP: (79-155)/(47-93) 90/48 (02/08 1000) SpO2:  [98 %-100 %] 100 % (02/08 1000) FiO2 (%):  [40 %-100 %] 40 % (02/08 0327) Weight:  [95.4 kg] 95.4 kg (02/08 0000) General: Sedated with propofol at 40/h, intubated.  Propofol was held for the exam. HEENT: Normocephalic atraumatic CVS: Regular rate rhythm Abdomen nondistended nontender Extremities warm well perfused Neurological exam Sedated on propofol, intubated Propofol was held for the duration of the exam Within a few minutes of holding propofol, he started to have full body myoclonic jerking with spontaneous eye opening in a  jerky fashion as well. His pupils are 2 mm barely reactive to light bilaterally.  His gaze is disconjugate. Corneal reflexes are absent Cough reflex absent Gag reflex absent He is breathing with the ventilator No response to noxious stimulation  Propofol was resumed after the examination was completed  Labs I have reviewed labs in epic and the results pertinent to this consultation are:  CBC    Component Value Date/Time   WBC 10.6 (H) February 03, 2022 0507   RBC 3.05 (L) 2022-02-03 0507   HGB 8.5 (L) 2022/02/03 0507   HGB 11.5 (L) 12/05/2019 1528   HCT 25.7 (L) 02-03-22 0507   HCT 34.5 (L) 12/05/2019 1528   PLT 267 03-Feb-2022 0507   PLT 303 09/20/2020 1500   MCV 84.3 2022/02/03 0507   MCV 83 12/05/2019 1528   MCV 85 04/03/2015 1216   MCH 27.9 02-03-2022  0507   MCHC 33.1 2022/02/06 0507   RDW 15.4 02-06-22 0507   RDW 13.9 12/05/2019 1528   RDW 15.1 (H) 04/03/2015 1216   LYMPHSABS 3.3 01/28/2022 2104   LYMPHSABS 1.7 05/31/2014 0416   MONOABS 0.9 02/02/2022 2104   MONOABS 0.5 05/31/2014 0416   EOSABS 0.1 02/06/2022 2104   EOSABS 0.2 05/31/2014 0416   BASOSABS 0.0 02/12/2022 2104   BASOSABS 0.0 05/31/2014 0416    CMP     Component Value Date/Time   NA 133 (L) 2022/02/06 0507   NA 134 (A) 12/26/2021 0000   NA 125 (L) 04/03/2015 1216   K 3.7 2022/02/06 0507   K 3.6 04/03/2015 1216   CL 102 February 06, 2022 0507   CL 95 (L) 04/03/2015 1216   CO2 17 (L) 02-06-2022 0507   CO2 25 04/03/2015 1216   GLUCOSE 109 (H) 2022-02-06 0507   GLUCOSE 129 (H) 04/03/2015 1216   BUN 56 (H) February 06, 2022 0507   BUN 36 (A) 12/26/2021 0000   BUN 8 04/03/2015 1216   CREATININE 3.48 (H) February 06, 2022 0507   CREATININE 0.83 04/03/2015 1216   CALCIUM 7.9 (L) 02/06/22 0507   CALCIUM 7.9 (L) 04/03/2015 1216   PROT 7.3 02/08/2022 2104   PROT 6.9 09/20/2020 1500   PROT 7.2 04/03/2015 1216   ALBUMIN 2.9 (L) 02/06/2022 2104   ALBUMIN 3.2 (L) 09/02/2021 1418   ALBUMIN 3.5 04/03/2015 1216   AST 27  01/23/2022 2104   AST 23 04/03/2015 1216   ALT 12 01/20/2022 2104   ALT 17 04/03/2015 1216   ALKPHOS 127 (H) 02/06/2022 2104   ALKPHOS 104 04/03/2015 1216   BILITOT 0.9 02/06/2022 2104   BILITOT 0.3 09/20/2020 1500   BILITOT 0.9 04/03/2015 1216   GFRNONAA 17 (L) 02/06/22 0507   GFRNONAA >60 04/03/2015 1216   GFRAA 30.39 12/26/2021 0000   GFRAA >60 04/03/2015 1216    Lipid Panel     Component Value Date/Time   CHOL 105 08/26/2021 1545   CHOL 138 05/31/2014 0416   TRIG 88 02/06/22 0507   TRIG 75 05/31/2014 0416   HDL 38 (L) 08/26/2021 1545   HDL 40 05/31/2014 0416   CHOLHDL 2.8 08/26/2021 1545   CHOLHDL 2.4 04/01/2019 0431   VLDL 6 04/01/2019 0431   VLDL 15 05/31/2014 0416   LDLCALC 52 08/26/2021 1545   LDLCALC 83 05/31/2014 0416     Imaging I have reviewed the images obtained:  CT-head-no acute changes.  Stable left tentorial meningioma  Assessment:  87 year old man, brought in from a facility after he was found hanging by an electrical cord for an unknown duration of time, was in PEA with CPR required for at least 10 minutes prior to ROSC, exhibiting whole body myoclonic jerking upon arrival to the ER, loaded with Keppra and sedated on propofol after intubation. He has an extremely poor neurological exam off of sedation with spontaneous myoclonic jerking of the whole body and absent brainstem reflexes except for sluggish pupillary responses. I suspect that he has sustained severe anoxic brain injury.  Impression: - Anoxic brain injury - Post anoxic brian injury myoclonic status epilepticus  Recommendations: Symptomatic management of myoclonic jerking with propofol drip. Continue Keppra 500 twice daily.  Can add Valproate as next agent for symptomatic seizure control. EEG His clinical presentation-asphyxia unknown duration, cardiac arrest requiring CPR, advanced age, multiple comorbidities, myoclonic jerking at the time of presentation after cardiac arrest and an  extremely poor neurological exam without any purposeful movements portend  him an extremely poor chance of neurologically meaningful recovery. I would recommend contacting next of kin and discussing this critical condition that he is in for further goals of care discussion.  I am not sure if he will be able to survive this anoxic brain injury.  Plan was discussed with Dr. Lanney Gins in the ICU.  I will follow the patient with you -- Amie Portland, MD Neurologist [facility] Pager: [phone]  CRITICAL CARE ATTESTATION Performed by: Amie Portland, MD Total critical care time: 45 minutes Critical care time was exclusive of separately billable procedures and treating other patients and/or supervising APPs/Residents/Students Critical care was necessary to treat or prevent imminent or life-threatening deterioration due to severe anoxic brain injury This patient is critically ill and at significant risk for neurological worsening and/or death and care requires constant monitoring. Critical care was time spent personally by me on the following activities: development of treatment plan with patient and/or surrogate as well as nursing, discussions with consultants, evaluation of patient's response to treatment, examination of patient, obtaining history from patient or surrogate, ordering and performing treatments and interventions, ordering and review of laboratory studies, ordering and review of radiographic studies, pulse oximetry, re-evaluation of patient's condition, participation in multidisciplinary rounds and medical decision making of high complexity in the care of this patient.

## 2022-02-15 NOTE — Consult Note (Signed)
PHARMACY CONSULT NOTE  Pharmacy Consult for Electrolyte Monitoring and Replacement   Recent Labs: Potassium (mmol/L)  Date Value  January 27, 2022 3.7  04/03/2015 3.6   Magnesium (mg/dL)  Date Value  01-27-2022 2.6 (H)  05/29/2014 1.8   Calcium (mg/dL)  Date Value  01-27-2022 7.9 (L)   Calcium, Total (mg/dL)  Date Value  04/03/2015 7.9 (L)   Albumin (g/dL)  Date Value  01/21/2022 2.9 (L)  09/02/2021 3.2 (L)  04/03/2015 3.5   Phosphorus (mg/dL)  Date Value  01-27-22 4.5   Sodium  Date Value  January 27, 2022 133 mmol/L (L)  12/26/2021 134 (A)  04/03/2015 125 mmol/L (L)   Assessment: Patient is an 87 y/o M with medical history including alcoholism, asthma, bipolar disorder, combined systolic and diastolic CHF, CKD, COPD, CAD, HTN, HLD, ischemic cardiomyopathy, PVD, tobacco abuse, history of prostate cancer who is admitted for cardiac arrest after suicide attempt by hanging. Found by staff at [facility] where patient was a resident. Unknown period of asphyxiation / downtime. Patient underwent ACLS protocol for PEA arrest x 10 minutes before ROSC was obtained. Pharmacy consulted to assist with electrolyte monitoring and replacement as indicated.  Goal of Therapy:  Electrolytes within normal limits  Plan:  --No electrolyte replacement indicated at this time --AKI noted --Will follow-up electrolytes with AM labs tomorrow  Benita Gutter 01-27-2022 9:01 AM

## 2022-02-15 NOTE — Progress Notes (Signed)
eLink Physician-Brief Progress Note Patient Name: [name] DOB: 06/09/1935 MRN: [id number]   Date of Service  01-28-2022  HPI/Events of Note  Patient with out of hospital cardiac arrest secondary to attempted suicide by strangulation. He was intubated for acute hypoxemic / hypercapnic respiratory failure secondary to inability to protect his airway, and he is currently mechanically ventilated.  eICU Interventions  New Patient Evaluation.        [name] [name] 01/28/2022, 1:34 AM

## 2022-02-15 NOTE — Progress Notes (Signed)
Pharmacy Antibiotic Note  [name] is a 87 y.o. male admitted on 02/13/2022 with aspiration pneumonia.  Pharmacy has been consulted for Unasyn dosing.  Plan: Unasyn 3 gm q12h x 5 days per indication and current renal fxn.  Pharmacy will continue to follow and will adjust if warranted.  Height: 5\' 7"  (170.2 cm) Weight: 95.4 kg (210 lb 5.1 oz) IBW/kg (Calculated) : 66.1  Temp (24hrs), Avg:94.4 F (34.7 C), Min:92.3 F (33.5 C), Max:98.1 F (36.7 C)  Recent Labs  Lab 01/28/2022 2104 01/23/2022 2259 Feb 09, 2022 0004  WBC 9.4  --   --   CREATININE 3.51*  --  3.37*  LATICACIDVEN 6.2* 2.9*  --     Estimated Creatinine Clearance: 18 mL/min (A) (by C-G formula based on SCr of 3.37 mg/dL (H)).    Allergies  Allergen Reactions   Chlorthalidone Other (See Comments)    Hyponatremia   Ace Inhibitors Cough   Hydrochlorothiazide    Lisinopril Other (See Comments)   Losartan    Other Other (See Comments)    "ANY BLOOD PRESSURE MEDICATIONS THAT I'VE TRIED" - PT. DOES NOT REMEMBER WHICH ONES "ANY BLOOD PRESSURE MEDICATIONS THAT I'VE TRIED"- PT. DOES NOT REMEMBER WHICH ONES- CONSTIPATION   Benazepril-Hydrochlorothiazide Other (See Comments)    Constipation    Antimicrobials this admission: 2/8 Unasyn >> x 5 days  Microbiology results: 2/7 BCx: Pending  Thank you for allowing pharmacy to be a part of this patients care.  Renda Rolls, PharmD, [facility] 02/09/2022 4:59 AM

## 2022-02-15 NOTE — Patient Instructions (Incomplete)
____________________________________________________________________________________________  Medication Rules  Purpose: To inform patients, and their family members, of our rules and regulations.  Applies to: All patients receiving prescriptions (written or electronic).  Pharmacy of record: Pharmacy where electronic prescriptions will be sent. If written prescriptions are taken to a different pharmacy, please inform the nursing staff. The pharmacy listed in the electronic medical record should be the one where you would like electronic prescriptions to be sent.  Electronic prescriptions: In compliance with the [place] Strengthen Opioid Misuse Prevention (STOP) Act of 2017 (Session Law 2017-74/H243), effective December 18, 2018, all controlled substances must be electronically prescribed. Calling prescriptions to the pharmacy will cease to exist.  Prescription refills: Only during scheduled appointments. Applies to all prescriptions.  NOTE: The following applies primarily to controlled substances (Opioid* Pain Medications).   Type of encounter (visit): For patients receiving controlled substances, face-to-face visits are required. (Not an option or up to the patient.)  Patient's responsibilities: Pain Pills: Bring all pain pills to every appointment (except for procedure appointments). Pill Bottles: Bring pills in original pharmacy bottle. Always bring the newest bottle. Bring bottle, even if empty. Medication refills: You are responsible for knowing and keeping track of what medications you take and those you need refilled. The day before your appointment: write a list of all prescriptions that need to be refilled. The day of the appointment: give the list to the admitting nurse. Prescriptions will be written only during appointments. No prescriptions will be written on procedure days. If you forget a medication: it will not be "Called in", "Faxed", or "electronically sent". You will  need to get another appointment to get these prescribed. No early refills. Do not call asking to have your prescription filled early. Prescription Accuracy: You are responsible for carefully inspecting your prescriptions before leaving our office. Have the discharge nurse carefully go over each prescription with you, before taking them home. Make sure that your name is accurately spelled, that your address is correct. Check the name and dose of your medication to make sure it is accurate. Check the number of pills, and the written instructions to make sure they are clear and accurate. Make sure that you are given enough medication to last until your next medication refill appointment. Taking Medication: Take medication as prescribed. When it comes to controlled substances, taking less pills or less frequently than prescribed is permitted and encouraged. Never take more pills than instructed. Never take medication more frequently than prescribed.  Inform other Doctors: Always inform, all of your healthcare providers, of all the medications you take. Pain Medication from other Providers: You are not allowed to accept any additional pain medication from any other Doctor or Healthcare provider. There are two exceptions to this rule. (see below) In the event that you require additional pain medication, you are responsible for notifying us, as stated below. Cough Medicine: Often these contain an opioid, such as codeine or hydrocodone. Never accept or take cough medicine containing these opioids if you are already taking an opioid* medication. The combination may cause respiratory failure and death. Medication Agreement: You are responsible for carefully reading and following our Medication Agreement. This must be signed before receiving any prescriptions from our practice. Safely store a copy of your signed Agreement. Violations to the Agreement will result in no further prescriptions. (Additional copies of our  Medication Agreement are available upon request.) Laws, Rules, & Regulations: All patients are expected to follow all Federal and State Laws, Statutes, Rules, & Regulations. Ignorance of   the Laws does not constitute a valid excuse.  Illegal drugs and Controlled Substances: The use of illegal substances (including, but not limited to marijuana and its derivatives) and/or the illegal use of any controlled substances is strictly prohibited. Violation of this rule may result in the immediate and permanent discontinuation of any and all prescriptions being written by our practice. The use of any illegal substances is prohibited. Adopted CDC guidelines & recommendations: Target dosing levels will be at or below 60 MME/day. Use of benzodiazepines** is not recommended.  Exceptions: There are only two exceptions to the rule of not receiving pain medications from other Healthcare Providers. Exception #1 (Emergencies): In the event of an emergency (i.e.: accident requiring emergency care), you are allowed to receive additional pain medication. However, you are responsible for: As soon as you are able, call our office [phone], at any time of the day or night, and leave a message stating your name, the date and nature of the emergency, and the name and dose of the medication prescribed. In the event that your call is answered by a member of our staff, make sure to document and save the date, time, and the name of the person that took your information.  Exception #2 (Planned Surgery): In the event that you are scheduled by another doctor or dentist to have any type of surgery or procedure, you are allowed (for a period no longer than 30 days), to receive additional pain medication, for the acute post-op pain. However, in this case, you are responsible for picking up a copy of our "Post-op Pain Management for Surgeons" handout, and giving it to your surgeon or dentist. This document is available at our office, and  does not require an appointment to obtain it. Simply go to our office during business hours (Monday-Thursday from 8:00 AM to 4:00 PM) (Friday 8:00 AM to 12:00 Noon) or if you have a scheduled appointment with us, prior to your surgery, and ask for it by name. In addition, you are responsible for: calling our office [phone], at any time of the day or night, and leaving a message stating your name, name of your surgeon, type of surgery, and date of procedure or surgery. Failure to comply with your responsibilities may result in termination of therapy involving the controlled substances. Medication Agreement Violation. Following the above rules, including your responsibilities will help you in avoiding a Medication Agreement Violation ("Breaking your Pain Medication Contract").  *Opioid medications include: morphine, codeine, oxycodone, oxymorphone, hydrocodone, hydromorphone, meperidine, tramadol, tapentadol, buprenorphine, fentanyl, methadone. **Benzodiazepine medications include: diazepam (Valium), alprazolam (Xanax), clonazepam (Klonopine), lorazepam (Ativan), clorazepate (Tranxene), chlordiazepoxide (Librium), estazolam (Prosom), oxazepam (Serax), temazepam (Restoril), triazolam (Halcion) (Last updated: 09/14/2021) ____________________________________________________________________________________________   

## 2022-02-15 NOTE — TOC Initial Note (Signed)
Transition of Care [facility]) - Initial/Assessment Note    Patient Details  Name: [name] MRN: [id number] Date of Birth: 09/25/1935  Transition of Care [facility]) CM/SW Contact:    Shelbie Hutching, RN Phone Number: 02-22-22, 10:08 AM  Clinical Narrative:                 Patient admitted to the hospital after cardiac arrest.  Patient is in the ICU intubated and sedated, poor prognosis.  Palliative has been consulted.  Patient is from [facility].          Patient Goals and CMS Choice        Expected Discharge Plan and Services                                                Prior Living Arrangements/Services                       Activities of Daily Living      Permission Sought/Granted                  Emotional Assessment              Admission diagnosis:  Cardiac arrest [facility]) [I46.9] Strangulation or suffocation, initial encounter [T71.194A] Cardiac arrest with successful resuscitation [facility]) [I46.9] Patient Active Problem List   Diagnosis Date Noted   Pain medication agreement broken (01/18/2022) 02/07/2022   Cardiac arrest with successful resuscitation ([place]) 01/20/2022   Urinary retention 16/60/6301   Acute metabolic encephalopathy 60/09/9322   Pressure injury of skin 11/24/2021   Sepsis secondary to UTI ([place]) 11/23/2021   Closed anterior dislocation of shoulder (Right) 08/02/2021   Enthesopathy of hip region 07/25/2021   Anterior dislocation of right shoulder 07/04/2021   Acute pain of right shoulder 06/29/2021   Therapeutic opioid-induced constipation (OIC) 06/29/2021   Chronic use of opiate for therapeutic purpose 06/06/2021   Hyposmolality and/or hyponatremia 04/15/2021   Hypothyroidism 04/15/2021   Type 2 diabetes mellitus with diabetic chronic kidney disease ([place]) 04/15/2021   Type 2 diabetes mellitus with diabetic peripheral angiopathy without gangrene ([place]) 04/15/2021   Weakness 02/11/2021   COVID-19 02/11/2021    Morbid obesity ([place]) 01/10/2021   Calcaneal fracture (Right) 01/10/2021   PVD (peripheral vascular disease) ([place]) 55/73/2202   Chronic systolic CHF (congestive heart failure) ([place]) 01/10/2021   Hip fracture ([place]) 01/08/2021   Osteoarthritis of knee (Left) 01/06/2021   Arthropathy of knee (Left) 01/06/2021   Chronic knee pain (Left) 01/06/2021   Class 3 obesity with alveolar hypoventilation and body mass index (BMI) of 40.0 to 44.9 in adult ([place]) 12/23/2020   Hip joint effusion (Left) 12/23/2020   History of alcohol abuse 11/29/2020   Elevated rheumatoid factor 11/29/2020   Polyarthritis with positive rheumatoid factor ([place]) 11/17/2020   Elevated uric acid in blood 11/17/2020   CKD (chronic kidney disease), stage IV ([place]) 10/26/2020   Proteinuria, unspecified 10/26/2020   Elevated BUN 10/26/2020   Elevated serum creatinine 10/26/2020   Hypochloremia 10/26/2020   Hypocalcemia 10/26/2020   Hypoalbuminemia 10/26/2020   Elevated alkaline phosphatase level 10/26/2020   Elevated hemoglobin A1c 10/26/2020   Elevated random blood glucose level 10/26/2020   Anemia in stage 3b chronic kidney disease ([place]) 10/26/2020   Klippel-Feil deformity at the C5-C7 levels 10/26/2020   Carotid artery calcification, bilateral  10/26/2020   History of 2.0 cm thyroid mass (Left lobe) 10/26/2020    Class: History of   Bilateral renal cysts 10/26/2020   Lumbosacral spondylosis 10/26/2020   Osteoarthritis of facet joint of lumbar spine 10/26/2020   DDD (degenerative disc disease), lumbosacral 10/26/2020   History of total hip replacement (Right) 10/26/2020   Osteoarthritis of hip (Left) 10/26/2020   Diverticulosis of sigmoid colon 10/26/2020   Abnormal MRI, lumbar spine (10/07/2020) 10/26/2020   Lumbar central spinal stenosis with neurogenic claudication 10/26/2020   Lumbar foraminal stenosis 10/26/2020   Cyst of kidney, acquired 10/26/2020   Elevated C-reactive protein (CRP) 10/25/2020   Elevated sed rate  10/25/2020   Vitamin D deficiency 10/25/2020   Chronic anticoagulation (Plavix) 09/20/2020   Chronic hip pain (1ry area of Pain) (Left) 09/20/2020   Unilateral post-traumatic osteoarthritis of hip (Left) 09/20/2020   Chronic knee pain (2ry area of Pain) (Bilateral) 09/20/2020   Secondary osteoarthritis of knee (Bilateral) 09/20/2020   Osteoarthritis of knees (Bilateral) 09/20/2020   Chronic constipation 09/20/2020   Chronic shoulder pain (3ry area of Pain) (Bilateral) 09/20/2020   Grade 1 (3 mm) anterolisthesis of lumbar spine (L4-5) (stable) 09/20/2020   Chronic groin pain (Left) 09/20/2020   Other intervertebral disc degeneration, lumbar region 09/20/2020   Neurogenic pain 09/20/2020   Chronic pain syndrome 09/18/2020   Pharmacologic therapy 09/18/2020   Disorder of skeletal system 09/18/2020   Problems influencing health status 09/18/2020   Lower extremity pain (Bilateral) 05/15/2018   Elevated troponin I level 07/31/2017   Hyperkalemia 05/28/2017   Syncope 05/27/2017   Meningioma ([place]) 01/05/2017   GI bleed 12/01/2016   Alcohol abuse 10/30/2016   COPD (chronic obstructive pulmonary disease) ([place]) 10/04/2016   Obstructive sleep apnea 10/04/2016   Hyponatremia 08/31/2016   Shortness of breath 02/11/2016   Hyperlipidemia, unspecified 12/03/2015   Essential hypertension 07/17/2013   Erectile dysfunction 05/27/2013   CAD (coronary artery disease), native coronary artery 01/19/2004   PCP:  Pcp, No Pharmacy:   [facility], [place] [address] Phone: [phone] Fax: [phone]  [place], [place] [address] [place] [address] Phone: [phone] Fax: [phone]     Social Determinants of Health (SDOH) Interventions    Readmission Risk Interventions Readmission Risk Prevention Plan 11/27/2021 02/17/2021   Transportation Screening Complete Complete  PCP or Specialist Appt within 3-5 Days - (No Data)  [place] or [place] - (No Data)  Social Work Consult for [place] Planning/Counseling - Complete  Medication Review Press photographer) Complete -  PCP or Specialist appointment within 3-5 days of discharge Complete -  [place] or [place] Complete -  SW Recovery Care/Counseling Consult Complete -  Palliative Care Screening Not Applicable -  [place] Complete -  Some recent data might be hidden

## 2022-02-15 NOTE — Consult Note (Signed)
Consultation Note Date: 01/26/22   Patient Name: [name]  DOB: [birth year]  MRN: [id number]  Age / Sex: 87 y.o., male  PCP: Pcp, No Referring Physician: Ottie Glazier, MD  Reason for Consultation: Establishing goals of care and Terminal Care  HPI/Patient Profile: 86 y.o. male  with past medical history of chronic combined systolic and diastolic dysfunction CHF, stage III chronic kidney disease, coronary artery disease, COPD, bipolar disorder, history of prostate cancer treated with radiation therapy in 1994 and s/p penile prosthesis in 2010 with revision in 2014 admitted on 01/18/2022 with acute respiratory failure with hypoxemia due to neck strangulation with intentional suicide attempt via hanging.   Clinical Assessment and Goals of Care: I have reviewed medical records including EPIC notes, labs and imaging, received report from RN, assessed the patient.  [name] is lying quietly in bed.  He is intubated/ventilated and sedated.  There is no family at bedside at this time.    Call to daughter/healthcare surrogate, [name].  I introduced Palliative Medicine as specialized medical care for people living with serious illness. It focuses on providing relief from the symptoms and stress of a serious illness. The goal is to improve quality of life for both the patient and the family.  We talk about [name] acute health concerns.  [name] gets her sister, [name], on the conference.  Family agree that [name] would not want escalation of care, long-term life support.  They shared that they are coming to the [name].  We discussed a brief life review of the patient.  His wife died Jan 03, 2021.  He has 5 children, daughters [name], [name], [name], and 2 sons.  He has been living at [name].    We then focused on their current illness.  We reviewed [name] advanced directives.  We talk about the  severity of his illness and expected outcomes.  The natural disease trajectory and expectations at EOL were discussed.  Note from nursing staff stating that family is at bedside.  We meet at the bedside along with daughters [name], [name], and [name] and granddaughter [name] to discuss diagnosis prognosis, [place], EOL wishes, disposition and options.  The difference between aggressive medical intervention and comfort care was considered in light of the patient's goals of care.   Family is electing compassionate extubation, comfort and dignity at end-of-life.  Family endorses DNR.  We talk about compassionate extubation, what this would look like and feel like.  At this time daughters would like to be present for [name] extubation.  We talk about prognosis, anticipated minutes.  Discussed the importance of continued conversation with family and the medical providers regarding overall plan of care and treatment options, ensuring decisions are within the context of the patients values and GOCs.  Questions and concerns were addressed.  The family was encouraged to call with questions or concerns.  PMT will continue to support holistically.  Conference with attending, neurologist, bedside nursing staff transition of care team related to patient condition, needs, goals  of care, disposition.  [name] has elected to donate his body to science.  He has completed the requirements with [name].  I spoke with [name] in intake.  She shares that when [name] passes call the [name] department at [phone].    HCPOA HCPOA -daughter, [name], is listed as healthcare surrogate.  Paperwork found under ACP tab in chart.  Additional person listed is [name] wife who died 15-Jan-2021.  [name] has 5 children, 3 daughters [name], [name], unnamed, and 2 sons unnamed.    SUMMARY OF RECOMMENDATIONS   Compassionate extubation, let nature take its course Anticipate in-[name] death. Body  will be donated to [name]: DNR  Symptom Management:  Propofol and fentanyl infusions  Palliative Prophylaxis:  Frequent Pain Assessment, Oral Care, and Turn Reposition  Additional Recommendations (Limitations, Scope, Preferences): Full Comfort Care  Psycho-social/Spiritual:  Desire for further Chaplaincy support:yes, chaplain services offered. Additional Recommendations: Caregiving  Support/Resources and Grief/Bereavement Support  Prognosis:  Hours - Days, even with aggressive care hours to days anticipated.  If/when family elects compassionate extubation, minutes would be anticipated  Discharge Planning: Anticipated [name] Death      Primary Diagnoses: Present on Admission:  Cardiac arrest with successful resuscitation [name])   I have reviewed the medical record, interviewed the patient and family, and examined the patient. The following aspects are pertinent.  Past Medical History:  Diagnosis Date   Alcoholism ([name])    Asthma    Bipolar affective disorder ([name])    Chronic combined systolic and diastolic CHF (congestive heart failure) ([name])    a. 08/2016 Echo: EF 40-45%, mild AS, mild to mod MR, mildly dil LA/RA, mild-mod TR; b. 07/2017 Echo: EF 40-45%, mod LVH, Gr1 DD, mild to mod AS, mildly dil LA, nl RV fxn; c. 03/2019 Echo: EF 45-50%, AS (not severe). Mod dil PA.   CKD (chronic kidney disease), stage III ([name])    COPD (chronic obstructive pulmonary disease) ([name])    Coronary artery disease    a. 01/2004 s/p PCI and Taxus DES to dRCA (3.5 x 12 mm); b. 07/2017 Lexiscan MV: no ischemia. Sm area of apicl thinning, likely attenuation. EF 33% (GI uptake noted)-->Low risk; c. 03/2019 Inf STEMI/PCI: LM nl, LAD min irregs, D1 20ost, RI 20ost, LCX nl, RCA 90p/62m (4.0x26 Resolute Onyx DES), 53m (4.0x15 Resolute Onyx DES), 10d ISR.   Degenerative joint disease    knees and hip   Dyspnea    on exertion   Essential hypertension     Hyperlipidemia    Hypertension    controlled on meds   Ischemic cardiomyopathy    a. 08/2016 Echo: EF 40-45%; b. 07/2017 Echo: EF 40-45%; c. 03/2019 Echo: EF 45-50%.   OSA (obstructive sleep apnea)    Prostate CA ([name])    prostate ca dx 20 yrs ago   PVD (peripheral vascular disease) ([name])    Tobacco abuse    Social History   Socioeconomic History   Marital status: Legally Separated    Spouse name: Not on file   Number of children: Not on file   Years of education: Not on file   Highest education level: Not on file  Occupational History   Occupation: retired  Tobacco Use   Smoking status: Former    Packs/day: 1.00    Years: 50.00    Pack years: 50.00    Types: Cigarettes   Smokeless tobacco: Never  Media planner  Vaping Use: Never used  Substance and Sexual Activity   Alcohol use: Not Currently   Drug use: No   Sexual activity: Never  Other Topics Concern   Not on file  Social History Narrative   Not on file   Social Determinants of Health   Financial Resource Strain: Not on file  Food Insecurity: Not on file  Transportation Needs: Not on file  Physical Activity: Not on file  Stress: Not on file  Social Connections: Not on file   Family History  Problem Relation Age of Onset   Hypertension Mother    Hyperlipidemia Mother    Heart attack Mother    Hypertension Father    Hyperlipidemia Father    Heart attack Father    Prostate cancer Neg Hx    Bladder Cancer Neg Hx    Kidney cancer Neg Hx    Scheduled Meds:  Chlorhexidine Gluconate Cloth  6 each Topical Daily   Continuous Infusions:  fentaNYL infusion INTRAVENOUS 12.5 mcg/hr (08-Feb-2022 0600)   propofol (DIPRIVAN) infusion 40 mcg/kg/min (2022/02/08 0726)   PRN Meds:.docusate sodium, fentaNYL, fentaNYL (SUBLIMAZE) injection, fentaNYL (SUBLIMAZE) injection, ipratropium-albuterol, midazolam, polyethylene glycol Medications Prior to Admission:  Prior to Admission medications   Medication Sig Start Date End Date  Taking? Authorizing Provider  ADULT ASPIRIN REGIMEN 81 MG EC tablet TAKE 1 TABLET BY MOUTH DAILY 03/08/20   [name], MD  albuterol (VENTOLIN HFA) 108 (90 Base) MCG/ACT inhaler USE 2 PUFFS EVERY 4 HOURS AS NEEDED 10/14/19   Darylene Price A, FNP  Amino Acids-Protein Hydrolys (PRO-STAT PO) Take 30 mLs by mouth 2 (two) times daily.    [provider]  atorvastatin (LIPITOR) 40 MG tablet Take 40 mg by mouth daily.    [provider]  azelastine (ASTELIN) 0.1 % nasal spray Place 1 spray into both nostrils daily as needed for allergies.    [provider]  bisacodyl (DULCOLAX) 10 MG suppository Place 10 mg rectally as needed for moderate constipation.    [provider]  calcium carbonate (OSCAL) 1500 (600 Ca) MG TABS tablet Take 1 tablet (1,500 mg total) by mouth 2 (two) times daily with a meal. 11/29/20 12/18/48  Milinda Pointer, MD  carvedilol (COREG) 6.25 MG tablet TAKE 1 TABLET BY MOUTH TWICE A DAY 08/15/21   [name], MD  clopidogrel (PLAVIX) 75 MG tablet Take 1 tablet (75 mg total) by mouth daily. 10/28/19   [name], MD  feeding supplement (ENSURE ENLIVE / ENSURE PLUS) LIQD Take 237 mLs by mouth 3 (three) times daily between meals. 01/13/21   Lorella Nimrod, MD  fluticasone (FLONASE) 50 MCG/ACT nasal spray Place 2 sprays into both nostrils daily.    [provider]  furosemide (LASIX) 40 MG tablet TAKE 1 TABLET BY MOUTH DAILY AND TAKE ANADDITIONAL TABLET IN THE AFTERNOON AS NEEDED FOR WEIGHT GAIN AND LEG EDEMA 04/21/21   [name], MD  ipratropium (ATROVENT) 0.03 % nasal spray Place 2 sprays into both nostrils daily. 05/07/20   [provider]  isosorbide mononitrate (IMDUR) 30 MG 24 hr tablet Take 30 mg by mouth daily. 10/26/21   [provider]  lubiprostone (AMITIZA) 8 MCG capsule Take 8 mcg by mouth 2 (two) times daily. 08/24/21   [provider]  Magnesium Hydroxide (MILK OF MAGNESIA PO) Take 30  mLs by mouth as needed.    [provider]  Magnesium Oxide 500 MG CAPS Take 1 capsule (500 mg total) by mouth  daily. 11/29/20 12/18/48  Milinda Pointer, MD  melatonin 5 MG TABS Take 5 mg by mouth at bedtime.    [provider]  nitroGLYCERIN (NITROSTAT) 0.4 MG SL tablet Take 0.4 mg by mouth every 5 (five) minutes x 3 doses as needed for chest pain. As needed for chest pain 12/06/15   [provider]  NON FORMULARY Mech soft diet without theraputic retriction    [provider]  oxyCODONE (OXY IR/ROXICODONE) 5 MG immediate release tablet Take 1 tablet (5 mg total) by mouth every 8 (eight) hours as needed for severe pain. Must last 30 days. 12/13/21   Medina-Vargas, Monina C, NP  QUEtiapine (SEROQUEL) 50 MG tablet Take 50 mg by mouth at bedtime.    [provider]  Sodium Phosphates (RA SALINE ENEMA RE) Place 1 Dose rectally as needed.    [provider]  tamsulosin (FLOMAX) 0.4 MG CAPS capsule Take 0.4 mg by mouth daily.    [provider]  Donnal Debar 200-62.5-25 MCG/INH AEPB Inhale 1 puff into the lungs daily.  04/08/20   [provider]  Cholecalciferol (VITAMIN D3) 125 MCG (5000 UT) CAPS Take 1 capsule (5,000 Units total) by mouth daily with breakfast. Take along with calcium and magnesium. Patient not taking: Reported on 01/27/2021 11/29/20 01/28/21  Milinda Pointer, MD   Allergies  Allergen Reactions   Chlorthalidone Other (See Comments)    Hyponatremia   Ace Inhibitors Cough   Hydrochlorothiazide    Lisinopril Other (See Comments)   Losartan    Other Other (See Comments)    "ANY BLOOD PRESSURE MEDICATIONS THAT I'VE TRIED" - PT. DOES NOT REMEMBER WHICH ONES "ANY BLOOD PRESSURE MEDICATIONS THAT I'VE TRIED"- PT. DOES NOT REMEMBER WHICH ONES- CONSTIPATION   Benazepril-Hydrochlorothiazide Other (See Comments)    Constipation   Review of Systems  Unable to perform ROS: Intubated   Physical Exam Vitals and  nursing note reviewed.  Cardiovascular:     Rate and Rhythm: Normal rate.  Pulmonary:     Comments: Intubated/ventilated   Vital Signs: BP (!) 90/48    Pulse 68    Temp 97.8 F (36.6 C) (Oral)    Resp (!) 21    Ht 5\' 7"  (1.702 m)    Wt 95.4 kg    SpO2 100%    BMI 32.94 kg/m  Pain Scale: CPOT   Pain Score: 0-No pain   SpO2: SpO2: 100 % O2 Device:SpO2: 100 % O2 Flow Rate: .   IO: Intake/output summary:  Intake/Output Summary (Last 24 hours) at January 27, 2022 1044 Last data filed at 27-Jan-2022 0900 Gross per 24 hour  Intake 1188.87 ml  Output --  Net 1188.87 ml    LBM: Last BM Date:  (PTA) Baseline Weight: Weight: 95.4 kg Most recent weight: Weight: 95.4 kg     Palliative Assessment/Data:   Flowsheet Rows    Flowsheet Row Most Recent Value  Intake Tab   Referral Department Pulmonary  Unit at Time of Referral ICU  Palliative Care Primary Diagnosis Neurology  Date Notified January 27, 2022  Palliative Care Type New Palliative care  Reason for referral Clarify Goals of Care, End of Life Care Assistance  Date of Admission 01/23/2022  Date first seen by Palliative Care 2022/01/27  # of days Palliative referral response time 0 Day(s)  # of days IP prior to Palliative referral 1  Clinical Assessment   Palliative Performance Scale Score 10%  Pain Max last 24 hours Not able to report  Pain Min Last  24 hours Not able to report  Dyspnea Max Last 24 Hours Not able to report  Dyspnea Min Last 24 hours Not able to report  Psychosocial & Spiritual Assessment   Palliative Care Outcomes        Time In: 0900 Time Out: 1030 Time Total: 90 minutes  Greater than 50%  of this time was spent counseling and coordinating care related to the above assessment and plan.  Signed by: Drue Novel, NP   Please contact Palliative Medicine Team phone at [phone] for questions and concerns.  For individual provider: See Shea Evans

## 2022-02-15 DEATH — deceased
